# Patient Record
Sex: Male | Born: 1945 | Race: White | Hispanic: No | Smoking: Never smoker
Health system: Southern US, Community
[De-identification: ages and names within clinical notes are randomized; demographics above are authoritative.]

## PROBLEM LIST (undated history)

## (undated) DIAGNOSIS — S46011A Strain of muscle(s) and tendon(s) of the rotator cuff of right shoulder, initial encounter: Secondary | ICD-10-CM

## (undated) DIAGNOSIS — I482 Chronic atrial fibrillation, unspecified: Secondary | ICD-10-CM

## (undated) DIAGNOSIS — Z8619 Personal history of other infectious and parasitic diseases: Secondary | ICD-10-CM

## (undated) DIAGNOSIS — I878 Other specified disorders of veins: Secondary | ICD-10-CM

## (undated) DIAGNOSIS — R351 Nocturia: Secondary | ICD-10-CM

## (undated) DIAGNOSIS — M109 Gout, unspecified: Secondary | ICD-10-CM

## (undated) DIAGNOSIS — N138 Other obstructive and reflux uropathy: Secondary | ICD-10-CM

## (undated) DIAGNOSIS — I5032 Chronic diastolic (congestive) heart failure: Secondary | ICD-10-CM

## (undated) DIAGNOSIS — N486 Induration penis plastica: Secondary | ICD-10-CM

## (undated) DIAGNOSIS — S43014A Anterior dislocation of right humerus, initial encounter: Secondary | ICD-10-CM

## (undated) DIAGNOSIS — R739 Hyperglycemia, unspecified: Secondary | ICD-10-CM

## (undated) DIAGNOSIS — I10 Essential (primary) hypertension: Secondary | ICD-10-CM

## (undated) DIAGNOSIS — N401 Enlarged prostate with lower urinary tract symptoms: Secondary | ICD-10-CM

## (undated) DIAGNOSIS — N529 Male erectile dysfunction, unspecified: Secondary | ICD-10-CM

## (undated) DIAGNOSIS — E291 Testicular hypofunction: Secondary | ICD-10-CM

## (undated) DIAGNOSIS — R931 Abnormal findings on diagnostic imaging of heart and coronary circulation: Secondary | ICD-10-CM

## (undated) HISTORY — DX: Benign prostatic hyperplasia with lower urinary tract symptoms: N40.1

## (undated) HISTORY — DX: Benign prostatic hyperplasia with lower urinary tract symptoms: N13.8

## (undated) HISTORY — DX: Hyperglycemia, unspecified: R73.9

## (undated) HISTORY — PX: PILONIDAL CYST EXCISION: SHX744

## (undated) HISTORY — DX: Induration penis plastica: N48.6

## (undated) HISTORY — PX: HERNIA REPAIR: SHX51

## (undated) HISTORY — DX: Chronic diastolic (congestive) heart failure: I50.32

## (undated) HISTORY — DX: Other specified disorders of veins: I87.8

## (undated) HISTORY — PX: LUMBAR FUSION: SHX111

## (undated) HISTORY — DX: Male erectile dysfunction, unspecified: N52.9

## (undated) HISTORY — DX: Gout, unspecified: M10.9

## (undated) HISTORY — DX: Testicular hypofunction: E29.1

## (undated) HISTORY — DX: Personal history of other infectious and parasitic diseases: Z86.19

## (undated) HISTORY — DX: Abnormal findings on diagnostic imaging of heart and coronary circulation: R93.1

## (undated) HISTORY — DX: Chronic atrial fibrillation, unspecified: I48.20

## (undated) HISTORY — DX: Essential (primary) hypertension: I10

## (undated) HISTORY — PX: KNEE SURGERY: SHX244

## (undated) HISTORY — PX: OTHER SURGICAL HISTORY: SHX169

## (undated) HISTORY — DX: Nocturia: R35.1

---

## 1996-01-15 ENCOUNTER — Encounter: Payer: Self-pay | Admitting: Cardiovascular Disease

## 1999-12-12 ENCOUNTER — Encounter: Payer: Self-pay | Admitting: Neurosurgery

## 1999-12-14 ENCOUNTER — Inpatient Hospital Stay (HOSPITAL_COMMUNITY): Admission: RE | Admit: 1999-12-14 | Discharge: 1999-12-15 | Payer: Self-pay | Admitting: Neurosurgery

## 1999-12-14 ENCOUNTER — Encounter: Payer: Self-pay | Admitting: Neurosurgery

## 2000-01-15 ENCOUNTER — Ambulatory Visit (HOSPITAL_COMMUNITY): Admission: RE | Admit: 2000-01-15 | Discharge: 2000-01-15 | Payer: Self-pay | Admitting: Neurosurgery

## 2000-01-15 ENCOUNTER — Encounter: Payer: Self-pay | Admitting: Neurosurgery

## 2000-03-18 ENCOUNTER — Ambulatory Visit (HOSPITAL_COMMUNITY): Admission: RE | Admit: 2000-03-18 | Discharge: 2000-03-18 | Payer: Self-pay | Admitting: Neurosurgery

## 2000-03-18 ENCOUNTER — Encounter: Payer: Self-pay | Admitting: Neurosurgery

## 2003-07-09 HISTORY — PX: OTHER SURGICAL HISTORY: SHX169

## 2004-03-27 ENCOUNTER — Inpatient Hospital Stay (HOSPITAL_COMMUNITY): Admission: AD | Admit: 2004-03-27 | Discharge: 2004-03-31 | Payer: Self-pay | Admitting: *Deleted

## 2004-03-28 HISTORY — PX: CARDIAC CATHETERIZATION: SHX172

## 2004-03-29 HISTORY — PX: OTHER SURGICAL HISTORY: SHX169

## 2004-03-30 ENCOUNTER — Encounter (INDEPENDENT_AMBULATORY_CARE_PROVIDER_SITE_OTHER): Payer: Self-pay | Admitting: Specialist

## 2005-05-09 ENCOUNTER — Ambulatory Visit: Payer: Self-pay | Admitting: Family Medicine

## 2006-03-25 ENCOUNTER — Inpatient Hospital Stay (HOSPITAL_COMMUNITY): Admission: RE | Admit: 2006-03-25 | Discharge: 2006-03-28 | Payer: Self-pay | Admitting: Orthopedic Surgery

## 2006-03-25 HISTORY — PX: TOTAL HIP ARTHROPLASTY: SHX124

## 2008-08-08 HISTORY — PX: TRANSTHORACIC ECHOCARDIOGRAM: SHX275

## 2009-01-06 ENCOUNTER — Encounter: Payer: Self-pay | Admitting: Cardiovascular Disease

## 2009-01-06 LAB — CONVERTED CEMR LAB
Cholesterol: 190 mg/dL
HDL: 77 mg/dL
Triglyceride fasting, serum: 65 mg/dL

## 2009-11-18 ENCOUNTER — Ambulatory Visit: Payer: Self-pay | Admitting: Cardiovascular Disease

## 2009-11-18 DIAGNOSIS — I482 Chronic atrial fibrillation, unspecified: Secondary | ICD-10-CM

## 2009-11-18 DIAGNOSIS — I10 Essential (primary) hypertension: Secondary | ICD-10-CM

## 2009-11-24 ENCOUNTER — Encounter: Payer: Self-pay | Admitting: Cardiovascular Disease

## 2009-12-06 ENCOUNTER — Ambulatory Visit: Payer: Self-pay | Admitting: Internal Medicine

## 2009-12-06 ENCOUNTER — Ambulatory Visit: Payer: Self-pay | Admitting: Cardiovascular Disease

## 2009-12-07 ENCOUNTER — Ambulatory Visit: Payer: Self-pay | Admitting: Internal Medicine

## 2009-12-08 ENCOUNTER — Encounter: Payer: Self-pay | Admitting: Cardiovascular Disease

## 2009-12-08 LAB — CONVERTED CEMR LAB
INR: 1.63
INR: 1.63 — ABNORMAL HIGH (ref <1.50)
Prothrombin Time: 19.2 s
Prothrombin Time: 19.2 s — ABNORMAL HIGH (ref 11.6–15.2)

## 2009-12-13 ENCOUNTER — Ambulatory Visit: Payer: Self-pay | Admitting: Internal Medicine

## 2009-12-16 ENCOUNTER — Ambulatory Visit: Payer: Self-pay | Admitting: Internal Medicine

## 2009-12-19 ENCOUNTER — Encounter: Payer: Self-pay | Admitting: Cardiovascular Disease

## 2009-12-19 DIAGNOSIS — I739 Peripheral vascular disease, unspecified: Secondary | ICD-10-CM

## 2009-12-20 ENCOUNTER — Ambulatory Visit: Payer: Self-pay | Admitting: Internal Medicine

## 2009-12-20 ENCOUNTER — Ambulatory Visit: Payer: Self-pay

## 2009-12-20 ENCOUNTER — Ambulatory Visit: Payer: Self-pay | Admitting: Cardiovascular Disease

## 2009-12-21 ENCOUNTER — Encounter: Payer: Self-pay | Admitting: Cardiovascular Disease

## 2009-12-27 ENCOUNTER — Ambulatory Visit: Payer: Self-pay | Admitting: Internal Medicine

## 2010-01-03 ENCOUNTER — Ambulatory Visit: Payer: Self-pay | Admitting: Internal Medicine

## 2010-01-03 ENCOUNTER — Ambulatory Visit: Payer: Self-pay | Admitting: Cardiovascular Disease

## 2010-01-04 LAB — CONVERTED CEMR LAB
INR: 2.13
Prothrombin Time: 23.8 s

## 2010-01-10 ENCOUNTER — Ambulatory Visit: Payer: Self-pay | Admitting: Internal Medicine

## 2010-01-16 ENCOUNTER — Ambulatory Visit: Payer: Self-pay | Admitting: Internal Medicine

## 2010-02-01 ENCOUNTER — Encounter: Payer: Self-pay | Admitting: Cardiovascular Disease

## 2010-02-01 ENCOUNTER — Ambulatory Visit: Payer: Self-pay | Admitting: Cardiovascular Disease

## 2010-02-02 LAB — CONVERTED CEMR LAB
INR: 2.85 — ABNORMAL HIGH (ref <1.50)
Prothrombin Time: 29.7 s
Prothrombin Time: 29.7 s — ABNORMAL HIGH (ref 11.6–15.2)

## 2010-03-27 ENCOUNTER — Encounter: Payer: Self-pay | Admitting: Cardiovascular Disease

## 2010-04-03 ENCOUNTER — Telehealth: Payer: Self-pay | Admitting: Cardiovascular Disease

## 2010-04-06 ENCOUNTER — Ambulatory Visit: Payer: Self-pay | Admitting: Cardiovascular Disease

## 2010-04-06 LAB — CONVERTED CEMR LAB: POC INR: 3.2

## 2010-04-19 ENCOUNTER — Ambulatory Visit: Payer: Self-pay | Admitting: Cardiovascular Disease

## 2010-05-05 ENCOUNTER — Ambulatory Visit: Payer: Self-pay | Admitting: Cardiovascular Disease

## 2010-05-17 ENCOUNTER — Ambulatory Visit: Payer: Self-pay | Admitting: Cardiovascular Disease

## 2010-06-14 ENCOUNTER — Emergency Department (HOSPITAL_COMMUNITY): Admission: EM | Admit: 2010-06-14 | Discharge: 2010-06-15 | Payer: Self-pay | Admitting: Emergency Medicine

## 2010-06-21 ENCOUNTER — Encounter: Payer: Self-pay | Admitting: Cardiovascular Disease

## 2010-11-05 LAB — CONVERTED CEMR LAB
INR: 1.82 — ABNORMAL HIGH (ref <1.50)
Prothrombin Time: 20.9 s
Prothrombin Time: 20.9 s — ABNORMAL HIGH (ref 11.6–15.2)

## 2010-11-07 NOTE — Progress Notes (Signed)
Summary: Overdue for coumadin follow-up  Phone Note Outgoing Call   Call placed by: Cloyde Reams RN,  April 03, 2010 2:32 PM Call placed to: Patient Summary of Call: Called spoke with pt.  Coumadin last checked 02/02/10, pt is past due for coumadin f/u pt has coumadin f/u scheduled for 04/26/10.  Advised pt needs sooner appt has been 2 months since last check.  Scheduled pt appt for 04/06/10 at 10am. Initial call taken by: Cloyde Reams RN,  April 03, 2010 2:37 PM

## 2010-11-07 NOTE — Medication Information (Signed)
Summary: [facility]  Anticoagulant Therapy  Managed by: Cloyde Reams, RN, BSN Referring MD: Mariah Milling PCP: [name], MD Supervising MD: Mariah Milling Indication 1: Atrial Fibrillation Lab Used: [facility] Site: [place] INR POC 3.2 INR RANGE 2.0-3.0  Dietary changes: no    Health status changes: no    Bleeding/hemorrhagic complications: no    Recent/future hospitalizations: no    Any changes in medication regimen? no    Recent/future dental: no  Any missed doses?: no       Is patient compliant with meds? yes       Allergies: No Known Drug Allergies  Anticoagulation Management History:      The patient is taking warfarin and comes in today for a routine follow up visit.  Negative risk factors for bleeding include an age less than 74 years old.  The bleeding index is 'low risk'.  Positive CHADS2 values include History of HTN.  Negative CHADS2 values include Age > 59 years old.  His last INR was 2.85.  Anticoagulation responsible provider: Blaike Vickers.  INR POC: 3.2.  Cuvette Lot#: [id number].  Exp: 06/2011.    Anticoagulation Management Assessment/Plan:      The patient's current anticoagulation dose is Warfarin sodium 6 mg tabs: 1 by mouth once daily.  The target INR is 2.0-3.0.  The next INR is due 05/08/2010.  Anticoagulation instructions were given to patient.  Results were reviewed/authorized by Cloyde Reams, RN, BSN.  He was notified by Cloyde Reams RN.         Prior Anticoagulation Instructions: INR 3.2  Pt has not been eating greens at all.  Asked him to start eating greens.  Continue same dose of 1 tab daily except 1.5 on Wednesday.  Recheck in 2 weeks.   Current Anticoagulation Instructions: INR 3.2  Start taking 6mg  daily. Recheck in 3 weeks.

## 2010-11-07 NOTE — Progress Notes (Signed)
Summary: PHI  PHI   Imported By: Harlon Flor 11/22/2009 15:53:40  _____________________________________________________________________  External Attachment:    Type:   Image     Comment:   External Document

## 2010-11-07 NOTE — Assessment & Plan Note (Signed)
Summary: NEW PT;   Visit Type:  New Patient Primary Provider:  [name], MD  CC:  high blood pressure the other day at hospital 145/96. No cp, sob all the time, and edema in ankles and feet. [name] Kitchen  History of Present Illness: [name] is a very pleasant 65 year old male with a history of chronic atrial fibrillation, moderate to severely dilated left atrium on echo in November 2009, history of hypertension who presents to reestablish care.  [name] states that his blood pressure has been elevated. He has noted diastolic pressures in the mid 82N. He has been taking his clonidine on an occasional basis. Otherwise he feels well and exercises at the gym on a frequent basis. He denies any shortness of breath. He does have some lower extremity edema which he takes Lasix as needed.  Preventive Screening-Counseling & Management  Alcohol-Tobacco     Alcohol drinks/day: 1     Smoking Status: never  Caffeine-Diet-Exercise     Caffeine use/day: none     Does Patient Exercise: yes  Current Problems (verified): 1)  Atrial Fibrillation  (ICD-427.31) 2)  Hypertension, Benign  (ICD-401.1)  Current Medications (verified): 1)  Warfarin Sodium 6 Mg Tabs (Warfarin Sodium) .[name] Kitchen.. 1 By Mouth Once Daily 2)  Carvedilol 25 Mg Tabs (Carvedilol) .... Take One Tablet By Mouth Twice A Day 3)  Allopurinol 300 Mg Tabs (Allopurinol) .... As Needed With Gout 4)  Colchicine 0.6 Mg Tabs (Colchicine) .... As Needed With Gout 5)  Daily Vitamins  Tabs (Multiple Vitamin) .[name] Kitchen.. 1 By Mouth Once Daily 6)  Benazepril-Hydrochlorothiazide 20-12.5 Mg Tabs (Benazepril-Hydrochlorothiazide) .[name] Kitchen.. 1 By Mouth Once Daily 7)  Clonidine Hcl 0.1 Mg Tabs (Clonidine Hcl) .... 1/2 By Mouth Once Daily 8)  Flomax 0.4 Mg Caps (Tamsulosin Hcl) .[name] Kitchen.. 1 By Mouth At Bedtime 9)  Proscar 5 Mg Tabs (Finasteride) .[name] Kitchen.. 1 By Mouth Once Daily 10)  Tetracycline Hcl 500 Mg Caps (Tetracycline Hcl) .[name] Kitchen.. 1 By Mouth Two Times A Day -  Not Sure of  Mg  Allergies (verified): No Known Drug Allergies  Past History:  Past Medical History: Last updated: 11/18/2009 Chronic Atrial Fib Echo 2009-moderate to severely dilated left atrium  hypertension Chronic venous stasis  Past Surgical History: Last updated: 11/18/2009  History of cardioversion in 2005. Pilonidal cyst  excision, lumbar fusion, cardiac catheterization, hernia repair age 38, right  eyebrow tumor removal.  Family History: Last updated: 11/18/2009  History of hypertension with father.  Social History: Last updated: 11/18/2009  Divorced. Teacher. Nonsmoker. Occasional intake of alcohol.  One child. His sister will be assisting with care after surgery.  Risk Factors: Alcohol Use: 1 (11/18/2009) Caffeine Use: none (11/18/2009) Exercise: yes (11/18/2009)  Risk Factors: Smoking Status: never (11/18/2009)  Social History: Alcohol drinks/day:  1 Smoking Status:  never Caffeine use/day:  none Does Patient Exercise:  yes  Review of Systems       The patient complains of peripheral edema.  The patient denies anorexia, fever, weight loss, weight gain, vision loss, decreased hearing, hoarseness, chest pain, syncope, dyspnea on exertion, prolonged cough, headaches, hemoptysis, abdominal pain, melena, hematochezia, severe indigestion/heartburn, hematuria, incontinence, genital sores, muscle weakness, suspicious skin lesions, transient blindness, difficulty walking, depression, unusual weight change, abnormal bleeding, enlarged lymph nodes, angioedema, breast masses, and testicular masses.    Vital Signs:  Patient profile:   65 year old male Height:      75 inches Weight:      220.75 pounds BMI:     27.69  Pulse rate:   65 / minute Pulse rhythm:   irregular BP sitting:   130 / 96  (left arm) Cuff size:   large  Vitals Entered By: Mercer Pod (November 18, 2009 9:25 AM)  Physical Exam  General:  well-appearing male in no apparent distress. Alert, oriented  x3, H. EENT exam is benign, oropharynx is clear, neck is supple with no JVP or carotid bruits, heart sounds are regular with no murmurs rubs or gallops, lungs are clear with no wheezes Rales, abdominal exam is benign, he has no significant 1+ LE edema, neurologic exam is grossly nonfocal, skin is warm and dry. Pulses are equal and symmetrical in his upper and lower extremities.   Impression & Recommendations:  Problem # 1:  HYPERTENSION, BENIGN (ICD-401.1) [name] states that his blood pressure is elevated when he checks it. His diastolic has been running in the mid 90s. He would like to decrease his medication list though once improved blood pressure control. We will double his benazepril/lHCTZ to 40/25 mg daily. I have asked him to purchase a blood pressure cuff but he prefers to have it checked at the local fire department. He did hold his clonidine and monitor his pressures. At asked him to contact us if his pressure continues to be elevated. He should also use his Lasix more frequently as he has lower extremity edema. her blood pressure continues to be a problem, we could increase his Coreg to one and a half tabs b.i.d. His updated medication list for this problem includes:    Carvedilol 12.5 Mg Tabs (Carvedilol) .[name] Kitchen... Take one tablet by mouth twice a day    Benazepril-hydrochlorothiazide 20-12.5 Mg Tabs (Benazepril-hydrochlorothiazide) .[name] Kitchen... 1 by mouth once two times a day    Clonidine Hcl 0.1 Mg Tabs (Clonidine hcl) .[name] Kitchen... 1/2 by mouth once daily  Problem # 2:  ATRIAL FIBRILLATION (ICD-427.31) we'll continue him on his current dose of Coreg which is 12.5 mg b.i.d. He has no symptoms of shortness of breath and exercises frequently with no problems. His updated medication list for this problem includes:    Warfarin Sodium 6 Mg Tabs (Warfarin sodium) .[name] Kitchen... 1 by mouth once daily    Carvedilol 12.5 Mg Tabs (Carvedilol) .[name] Kitchen... Take one tablet by mouth twice a  day Prescriptions: BENAZEPRIL-HYDROCHLOROTHIAZIDE 20-12.5 MG TABS (BENAZEPRIL-HYDROCHLOROTHIAZIDE) 1 by mouth once two times a day  #60 x 11   Entered by:   Charlena Cross, RN, BSN   Authorized by:   Dossie Arbour MD   Signed by:   Charlena Cross, RN, BSN on 11/18/2009   Method used:   Electronically to        [facility]. [address] [phone]* (retail)       [address] [place], [place]  [id number]       Ph: [phone]       Fax: [phone]   RxID:   [id number]   Appended Document: NEW PT; labs from April 2010 shows normal liver function, cholesterol 190, HDL 77, LDL 100.

## 2010-11-07 NOTE — Miscellaneous (Signed)
Summary: Orders Update  Clinical Lists Changes  Problems: Added new problem of PVD (ICD-443.9) Orders: Added new Test order of Arterial Duplex Lower Extremity (Arterial Duplex Low) - Signed 

## 2010-11-07 NOTE — Letter (Signed)
Summary: Medical Record Release  Medical Record Release   Imported By: Harlon Flor 11/24/2009 15:29:31  _____________________________________________________________________  External Attachment:    Type:   Image     Comment:   External Document

## 2010-11-07 NOTE — Medication Information (Signed)
Summary: [facility]/ALT  Anticoagulant Therapy  Managed by: Cloyde Reams, RN, BSN Referring MD: Mariah Milling PCP: [name], MD Supervising MD: Mariah Milling Indication 1: Atrial Fibrillation Lab Used: [facility] Site: [place] INR POC 1.9 INR RANGE 2.0-3.0  Dietary changes: no     Bleeding/hemorrhagic complications: no    Recent/future hospitalizations: no    Any changes in medication regimen? no     Any missed doses?: no       Is patient compliant with meds? yes       Allergies: No Known Drug Allergies  Anticoagulation Management History:      The patient is taking warfarin and comes in today for a routine follow up visit.  Negative risk factors for bleeding include an age less than 13 years old.  The bleeding index is 'low risk'.  Positive CHADS2 values include History of HTN.  Negative CHADS2 values include Age > 34 years old.  His last INR was 2.85.  Anticoagulation responsible provider: Jaicey Sweaney.  INR POC: 1.9.  Cuvette Lot#: [id number].  Exp: 05/2011.    Anticoagulation Management Assessment/Plan:      The patient's current anticoagulation dose is Warfarin sodium 6 mg tabs: 1 by mouth once daily.  The target INR is 2.0-3.0.  The next INR is due 05/24/2010.  Anticoagulation instructions were given to patient.  Results were reviewed/authorized by Cloyde Reams, RN, BSN.  He was notified by Benedict Needy, RN.         Prior Anticoagulation Instructions: INR 3.2  Start taking 6mg  daily. Recheck in 3 weeks.    Current Anticoagulation Instructions: INR 1.9  Take 1.5 tabs today. Continue taking 1 tab daily. Recheck in 2 weeks.

## 2010-11-07 NOTE — Medication Information (Signed)
Summary: [facility]  Anticoagulant Therapy  Managed by: Inactive Referring MD: [name] PCP: [name] Merry, MD Supervising MD: [name] Indication 1: Atrial Fibrillation Lab Used: [facility] Site: [place] INR RANGE 2.0-3.0          Comments: Called spoke with pt, pt states he is no longer seeing Dr [name], he has transfered his care to [facility] heart [facility], and is getting his PT/INR's checked there.  Had INR done last week there. [name] Reams RN  June 21, 2010 12:27 PM   Allergies: No Known Drug Allergies  Anticoagulation Management History:      Negative risk factors for bleeding include an age less than 4 years old.  The bleeding index is 'low risk'.  Positive CHADS2 values include History of HTN.  Negative CHADS2 values include Age > 67 years old.  His last INR was 2.85.  Anticoagulation responsible provider: gollan.  Exp: 07/2011.    Anticoagulation Management Assessment/Plan:      The patient's current anticoagulation dose is Warfarin sodium 6 mg tabs: 1 by mouth once daily.  The target INR is 2.0-3.0.  The next INR is due 06/14/2010.  Anticoagulation instructions were given to patient.  Results were reviewed/authorized by Inactive.         Prior Anticoagulation Instructions: INR 2.4  Continue on same dosage 6mg  daily.  Recheck in 4 weeks.

## 2010-11-07 NOTE — Medication Information (Signed)
Summary: [facility]/NE  Anticoagulant Therapy  Managed by: Cloyde Reams, RN, BSN Referring MD: Mariah Milling PCP: [name], MD Supervising MD: Mariah Milling Indication 1: Atrial Fibrillation Lab Used: [facility] Site:  INR POC 2.4 INR RANGE 2.0-3.0  Dietary changes: no    Health status changes: no    Bleeding/hemorrhagic complications: no    Recent/future hospitalizations: no    Any changes in medication regimen? no    Recent/future dental: no  Any missed doses?: no       Is patient compliant with meds? yes       Allergies: No Known Drug Allergies  Anticoagulation Management History:      The patient is taking warfarin and comes in today for a routine follow up visit.  Negative risk factors for bleeding include an age less than 32 years old.  The bleeding index is 'low risk'.  Positive CHADS2 values include History of HTN.  Negative CHADS2 values include Age > 74 years old.  His last INR was 2.85.  Anticoagulation responsible provider: gollan.  INR POC: 2.4.  Cuvette Lot#: [id number].  Exp: 07/2011.    Anticoagulation Management Assessment/Plan:      The patient's current anticoagulation dose is Warfarin sodium 6 mg tabs: 1 by mouth once daily.  The target INR is 2.0-3.0.  The next INR is due 06/14/2010.  Anticoagulation instructions were given to patient.  Results were reviewed/authorized by Cloyde Reams, RN, BSN.  He was notified by Cloyde Reams RN.         Prior Anticoagulation Instructions: INR 1.9  Take 1.5 tabs today. Continue taking 1 tab daily. Recheck in 2 weeks.   Current Anticoagulation Instructions: INR 2.4  Continue on same dosage 6mg  daily.  Recheck in 4 weeks.

## 2010-11-07 NOTE — Medication Information (Signed)
Summary: [facility]/ewj  Anticoagulant Therapy  Managed by: Cloyde Reams, RN, BSN Referring MD: Mariah Milling PCP: [name], MD Supervising MD: Shirlee Latch MD, Dalton Indication 1: Atrial Fibrillation Lab Used: [facility] Site: [place] INR POC 3.2 INR RANGE 2.0-3.0    Bleeding/hemorrhagic complications: no     Any changes in medication regimen? no     Any missed doses?: no       Is patient compliant with meds? yes       Allergies: No Known Drug Allergies  Anticoagulation Management History:      The patient is taking warfarin and comes in today for a routine follow up visit.  Negative risk factors for bleeding include an age less than 23 years old.  The bleeding index is 'low risk'.  Positive CHADS2 values include History of HTN.  Negative CHADS2 values include Age > 51 years old.  His last INR was 2.85.  Anticoagulation responsible provider: Shirlee Latch MD, Dalton.  INR POC: 3.2.  Cuvette Lot#: [id number].  Exp: 06/2011.    Anticoagulation Management Assessment/Plan:      The patient's current anticoagulation dose is Warfarin sodium 6 mg tabs: 1 by mouth once daily.  The target INR is 2.0-3.0.  The next INR is due 04/19/2010.  Anticoagulation instructions were given to patient.  Results were reviewed/authorized by Cloyde Reams, RN, BSN.  He was notified by Benedict Needy, RN.         Prior Anticoagulation Instructions: INR 2.85  Attempted to call pt with results.  LMOM TCB. Cloyde Reams RN  February 02, 2010 10:29 AM  Called spoke with pt, advised to continue on same dosage 1 tablet daily except 1.5 tablets on Wednesdays.  Recheck in 4 weeks.    Current Anticoagulation Instructions: INR 3.2  Pt has not been eating greens at all.  Asked him to start eating greens.  Continue same dose of 1 tab daily except 1.5 on Wednesday.  Recheck in 2 weeks.

## 2011-02-23 NOTE — Op Note (Signed)
NAME:  [name], [name]                       ACCOUNT NO.:  [id number]   MEDICAL RECORD NO.:  [id number]                   PATIENT TYPE:  INP   LOCATION:  2034                                 FACILITY:  [facility]   PHYSICIAN:  Kinnie Scales. Annalee [name], M.D.            DATE OF BIRTH:  02/12/1946   DATE OF PROCEDURE:  03/30/2004  DATE OF DISCHARGE:                                 OPERATIVE REPORT   PREOPERATIVE DIAGNOSIS:  1. Recurrent right forehead mass.  2. 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  3. Chronic atrial fibrillation.  4. Status post cardiac catheterization and cardioversion (March 29, 2004, Dr.     Lenise Herald).   POSTOPERATIVE DIAGNOSIS:  1. Recurrent right forehead mass.  2. 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  3. Chronic atrial fibrillation.  4. Status post cardiac catheterization and cardioversion (March 29, 2004, Dr.     Lenise Herald).   PROCEDURE:  Revision excisional biopsy right forehead mass.   ANESTHESIA:  General endotracheal.   SURGEON:  Kinnie Scales. Annalee [name], M.D.   COMPLICATIONS:  None.   ESTIMATED BLOOD LOSS:  Minimal.   DISPOSITION:  The patient is transferred from the operating room to the  recovery room in stable condition.   BRIEF HISTORY:  [name] is a 64 year old white male who presented for  evaluation of a right anterior forehead mass.  The patient had undergone  previous excision in [place] approximately one year ago for a benign  lipoma of the forehead.  The patient had excellent postoperative healing but  noted continued swelling in the surgical site over the ensuing year.  He was  scheduled for admission to the hospital under Dr. Martyn Malay care on  the cardiology service for cardiac catheterization and cardioversion for  chronic atrial fibrillation.  The patient's anticoagulant therapy was  modified to heparin and with a clear cardiac cath and successful  cardioversion, the patient was  scheduled for the above surgical procedure  under general anesthesia at [facility] main OR on March 30, 2004.  The risks, benefits, and possible complications of the procedure including  recurrent lipoma or mass, poor wound healing and scarring, bleeding,  paralysis of the forehead musculature, and chronic numbness, were discussed  in detail with the patient.  These risks and possible complications were  discussed but are not exclusive of other possible risks and problems.  The  patient understood and concurred with our plan for surgery which was  scheduled as outlined above.   SURGICAL PROCEDURE:  The patient was brought to the operating room on March 30, 2004.  General endotracheal anesthesia was established without  difficulty.  When the patient had been adequately anesthetized, his head was  cleaned and he was injected with 1 mL of 1% lidocaine with 1:100,000  epinephrine in a pre-existing skin crease/previous surgical scar along the  right anterior forehead.  After allowing adequate time for  vasoconstriction,  the patient was prepped and draped in a sterile fashion.  The surgical  procedure was begun by creating a 2 cm horizontally oriented incision with a  #15 scalpel blade.  The incision was created in the pre-existing scar from  his prior surgery which corresponded with a natural anterior forehead  crease.  The dissection was carried out meticulously through the  subcutaneous tissue.  A significant amount of scar tissue was encountered  immediately deep to the prior surgical scar and this was resected in its  entirety.  The supraorbital and supratrochlear neurovascular bundle was not  encountered at this plane and dissection was carried out deep through the  anterior forehead muscles, which had been previously divided.  Deep to the  muscles and adjacent to the periosteum of the skull, an approximately 2 by 2  cm lipoma was encountered.  This was gently dissected from the  surrounding  deep tissues and removed in its entirety.  The individual specimens were  sent to pathology for gross and microscopic evaluation.  The patient's wound  was then thoroughly irrigated with saline solution.  There was no active  bleeding.  Several areas of minimal bleeding were cauterized with bipolar  cautery.  The wound was then closed in multiple layers consisting of  reapproximation of the forehead musculature with a 4-0 Vicryl suture in an  interrupted fashion, deep subcutaneous tissue was reapproximated with a 5-0  Vicryl suture in an interrupted fashion, and final skin closure was achieved  with a 6-0 Ethilon suture in an interrupted fashion along the skin margins.  The wound was cleaned and dressed with Bacitracin ointment.  He was awakened  from his anesthetic, he was extubated and transferred from the operating  room to the recovery room in stable condition.  There were no complications.  Estimated blood loss minimal.                                               [name], M.D.    DLS/MEDQ  D:  04/54/0981  T:  03/31/2004  Job:  [id number]   cc:   Darlin Priestly, M.D.  [address], [address]  [place]  [place] [address]  Fax: [phone]

## 2011-02-23 NOTE — Discharge Summary (Signed)
NAME:  [name], [name]               ACCOUNT NO.:  [id number]   MEDICAL RECORD NO.:  [id number]          PATIENT TYPE:  INP   LOCATION:  1612                         FACILITY:  [facility]   PHYSICIAN:  Ollen Gross, M.D.    DATE OF BIRTH:  [birth year]   DATE OF ADMISSION:  03/25/2006  DATE OF DISCHARGE:  03/28/2006                                 DISCHARGE SUMMARY   ADMISSION DIAGNOSES:  1. Osteoarthritis, right hip.  2. Hypertension.  3. History of atrial fibrillation.  4. History of elevated uric acid.   DISCHARGE DIAGNOSES:  1. Osteoarthritis, right hip, status post right total hip arthroplasty.  2. Acute blood loss anemia.  3  Hypertension.  1. History of atrial fibrillation.  2. History of elevated uric acid.   PROCEDURE PERFORMED:  March 25, 2006 right total hip arthroplasty.   SURGEON:  Ollen Gross, M.D.   ASSISTANT:  Alexzandrew L. Perkins, P.A.   CONSULTATIONS:  None.   HISTORY OF PRESENT ILLNESS:  [name] is a 65 year old with end stage  arthritis of the right hip failed nonoperative management including  injections, now presents for total hip arthroplasty.   LABORATORY DATA:  Preoperative CBC:  Hemoglobin 13.6, hematocrit 39.8, white  blood cell count 4300.  Postop hemoglobin 9.5 drifting down to 8.3, last  hemoglobin and hematocrit was 8.0 and 23.2.  PT and PTT preop 14.3 and 30  respectively.  INR of 1.1__________ PT and INR 16.2 and 1.3.  Chem panel on  admission had mildly elevated AST at 38.  Remainder of chem panel within  normal limits.  Serial BMETs were followed.  Serial electrolytes remained  within normal limits.  Urinalysis negative.  Blood group type A positive.   EKG March 29, 2006 atrial fibrillation with premature ventricular variant  conducted complexes.  Left axis deviation, nonspecific interventricular  conduction delay, ST and T wave abnormalities.  No change since last tracing  from Dr. Kristeen Miss.  2-view chest March 21, 2006 slight  prominence of  pericardial silhouette with no evidence of active cardiopulmonary disease.  Portable pelvis March 25, 2006 right total hip in good position on portable  view.  Portal hip March 25, 2006 right total hip in good positional single  portable view.   HOSPITAL COURSE:  Admitted [facility], tolerated  procedure well, later sent to recovery room and orthopedic floor started on  PCA and p.o. analgesic pain control after surgery.  Given 24 hours  postoperative IV antibiotics.  Was doing very well on the morning of day 1  in good spirits, Hemovac drain placed at surgery was pulled on day 1,  started getting up with physical therapy.  By day 2 was doing great, little  bit of pain, but all of his preoperative severe pain was gone.  He did well  with his therapy ambulating 175 feet.  Dressing was changed and incision  looked good.  His hemoglobin was low at 8.3 but he denied any  lightheadedness, dizziness or fatigue.  He was doing well, weaned over to  p.o. meds by following day of March 28, 2006, despite having his hemoglobin a  little low at 8.0 he was doing well with no complaints.  Did well with his  therapy and was discharged home.   DISCHARGE PLAN:  1. The patient was discharged home on March 28, 2006.  2. Discharge diagnosis:  See above.  3. Discharge meds:  Coumadin, Lovenox for 3 days. Nu-Iron, Percocet and      Robaxin.   Diet as tolerated.  Follow-up two weeks.  Activity:  Home PT, home nursing,  total hip protocol 25 to 50% partial weightbearing right lower extremity.   DISPOSITION:  Home.   CONDITION ON DISCHARGE:  Stable and improved.      Alexzandrew L. Julien Girt, P.A.      Ollen Gross, M.D.  Electronically Signed    ALP/MEDQ  D:  05/09/2006  T:  05/09/2006  Job:  [id number]   cc:   Darlin Priestly, MD  Fax: [phone]   [name]  Fax: [phone]

## 2011-02-23 NOTE — Cardiovascular Report (Signed)
NAME:  [name], [name]                       ACCOUNT NO.:  [id number]   MEDICAL RECORD NO.:  [id number]                   PATIENT TYPE:  INP   LOCATION:  2034                                 FACILITY:  [facility]   PHYSICIAN:  Darlin Priestly, M.D.             DATE OF BIRTH:  Feb 22, 1946   DATE OF PROCEDURE:  03/29/2004  DATE OF DISCHARGE:                              CARDIAC CATHETERIZATION   PROCEDURES:  TEE guided DC cardioversion.   ATTENDING PHYSICIAN:  Darlin Priestly, M.D.   COMPLICATIONS:  None.   INDICATIONS:  Mr. Kuch is a 65 year old male patient of Dr. [name]  with a history of a atrial fibrillation dating back to 2000, history of  moderately depressed EF at 30% who is now admitted for attempt at  restoration of sinus rhythm and cardiac catheterization.  The patient did  undergo cardiac catheterization on March 28, 2004 revealing nonspecific  coronary artery disease with moderately severe depressed EF.  He is now  brought for TEE guided cardioversion.   DESCRIPTION OF OPERATION:  After obtaining informed written consent, the  patient was brought to the endoscopy suite in the fasting state.  The  patient underwent successful noncomplicated transesophageal echocardiogram.   There was moderate LV dilatation with moderate to severely depressed LV  systolic function.  Estimated EF is approximately 30%.   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   Obstruction of  the mitral valve with mild mitral regurgitation.   Obstruction of the tricuspid valve with mild tricuspid regurgitation.   No evidence of intracardiac mass or thrombus noted.   There was a moderate amount of spontaneous echo contrast noted in the left  atrium and left atrial appendage as well as LV consistent with sluggish  flow.   Normal descending thoracic aorta.   Upon conclusion of the TEE, the patient then was given 125 mg of Pentothal  by Dr. Sondra Come.  The patient then received one biphasic  shock at 150 joules  with restoration to sinus bradycardia.  He awoke and was transferred to the  recovery room in satisfactory condition.   CONCLUSION:  1. Successful transesophageal echocardiogram with findings noted above.  2. Successful DC cardioversion to sinus rhythm.                                               Darlin Priestly, M.D.    RHM/MEDQ  D:  03/29/2004  T:  03/30/2004  Job:  [id number]   cc:   [name]  [address] [place]  [place] [address]  Fax: [phone]

## 2011-02-23 NOTE — Discharge Summary (Signed)
NAME:  [name], [name]               ACCOUNT NO.:  [id number]   MEDICAL RECORD NO.:  [id number]          PATIENT TYPE:  INP   LOCATION:  1612                         [facility]ACILITY:  [facility]   PHYSICIAN:  Alexzandrew L. Perkins, P.A.DATE OF BIRTH:  Oct 27, 1945   DATE OF ADMISSION:  03/25/2006  DATE OF DISCHARGE:  03/28/2006                                 DISCHARGE SUMMARY   Audio too short to transcribe (less than 5 seconds)      Alexzandrew L. Julien Girt, P.A.     ALP/MEDQ  D:  05/09/2006  T:  05/09/2006  Job:  [id number]

## 2011-02-23 NOTE — Cardiovascular Report (Signed)
NAME:  [name], KNUEPPEL                       ACCOUNT NO.:  [id number]   MEDICAL RECORD NO.:  [id number]                   PATIENT TYPE:  INP   LOCATION:  2034                                 FACILITY:  [facility]   PHYSICIAN:  Darlin Priestly, M.D.             DATE OF BIRTH:  [birth year]   DATE OF PROCEDURE:  03/28/2004  DATE OF DISCHARGE:                              CARDIAC CATHETERIZATION   PROCEDURES:  1. Left heart catheterization.  2. Coronary angiography.  3. Left ventriculogram.  4. Abdominal aortogram.   ATTENDING PHYSICIAN:  Darlin Priestly, M.D.   COMPLICATIONS:  None.   INDICATIONS:  [name] is a 65 year old male patient of Dr. [name]  with history of atrial fibrillation dated back to 2000, history of  moderately depressed EF by echo at approximately 30%, history of abnormal  Cardiolite scan suggesting a possible anterior wall scar who is now brought  for Coumadin to heparin crossover with subsequent cardiac catheterization,  TEE guided cardioversion and lipoma excision from the right forehead.   DESCRIPTION OF OPERATION:  After obtaining informed written consent, the  patient was brought to the cardiac catheterization laboratory.  Right and  left groin were  shaved and prepped and draped in the usual sterile fashion.  ECG monitor was established.  Using the modified Seldinger technique, a 6  French arterial sheath was inserted in the right femoral artery. A 6 French  diagnostic catheter was then used to performed diagnostic angiography.  This  reveals a large left main with no significant disease.  The LAD is a large  vessel that coursed to the apex and gives rise to two diagonal branches.  The LAD has no significant disease.  The first and second diagonal are  medium size vessels with no significant disease.   The left coronary artery also gives rise to a medium size ramus intermedius  and bifurcates in the mid segment and has no significant  disease.   Left circumflex is a large vessel which courses in AV groove and goes to one  large bifurcating obtuse marginal.  The AV groove circumflex has no  significant disease.  The first OM is a large vessel that bifurcates in the  mid segment and has no significant disease.   The right coronary artery is a large vessel which is dominant.  Gives rise  to both PDA as well as posterior lateral branch.  There is no significant  disease in the RCA, PDA or posterior lateral branch.   LEFT VENTRICULOGRAM:  Left ventriculogram reveals a severely depressed EF at  25-30%.  There is global hypokinesis.   ABDOMINAL AORTOGRAM:  Reveals no evidence of renal artery stenosis.   HEMODYNAMICS:  Systemic arterial pressure 140/84, LV pressure 130/10, LVEDP  of 14.   CONCLUSIONS:  1. No significant coronary artery disease.  2. Moderate to severely depressed left ventricular systolic function.  3. No evidence of renal artery stenosis.  4. Systemic hypertension.                                               Darlin Priestly, M.D.    RHM/MEDQ  D:  03/28/2004  T:  03/29/2004  Job:  [id number]   cc:   [name]  [address]  [place]  [place] [address]  Fax: [phone]

## 2011-02-23 NOTE — H&P (Signed)
NAME:  [name], [name]               ACCOUNT NO.:  [id number]   MEDICAL RECORD NO.:  [id number]          PATIENT TYPE:  INP   LOCATION:  1612                         FACILITY:  [facility]   PHYSICIAN:  Ollen Gross, M.D.    DATE OF BIRTH:  [birth year]   DATE OF ADMISSION:  03/25/2006  DATE OF DISCHARGE:  03/28/2006                                HISTORY & PHYSICAL   CHIEF COMPLAINT:  Right hip pain.   HISTORY OF PRESENT ILLNESS:  The patient is a 65 year old male with  continued right hip pain. He has been seen by Dr. Lequita Halt. Found to have end  stage arthritis of the right hip. It has progressively gotten worse with  time. He subsequently reached a point where he had to undergo hip surgery.  Risks and benefits have been discussed. The patient subsequently admitted to  the hospital. He has been seen preoperatively by Dr. Lenise Herald and felt  low cardiovascular risk for upcoming surgery.   ALLERGIES:  NO KNOWN DRUG ALLERGIES.   CURRENT MEDICATIONS:  Coumadin, amiodarone, Coreg, Allopurinol, colchicine,  multivitamin, Lotensin, Clonidine, and Tramadol.   PAST MEDICAL HISTORY:  Hypertension, atrial fibrillation, history of  elevated uric acid.   PAST SURGICAL HISTORY:  History of cardioversion in 2005. Pilonidal cyst  excision, lumbar fusion, cardiac catheterization, hernia repair age 63, right  eyebrow tumor removal.   SOCIAL HISTORY:  Divorced. Teacher. Nonsmoker. Occasional intake of alcohol.  One child. His sister will be assisting with care after surgery.   FAMILY HISTORY:  History of hypertension with father.   REVIEW OF SYSTEMS:  GENERAL:  No fever, chills, night sweats. NEUROLOGIC:  No seizures, syncope, or paralysis. RESPIRATORY:  No shortness of breath,  productive cough, or hemoptysis. CARDIOVASCULAR:  No chest pain, angina,  orthopnea. GASTROINTESTINAL:  No nausea, vomiting, diarrhea, or  constipation. GENITOURINARY:  No dysuria, hematuria or discharge.  MUSCULOSKELETAL:  Right hip.   PHYSICAL EXAMINATION:  VITAL SIGNS:  Pulse 52, respiratory rate 12, blood  pressure 130/82.  GENERAL:  A 65 year old white male, well developed, well nourished, no acute  distress. His is alert, oriented, cooperative, and pleasant. He is a good  historian.  HEENT:  Normocephalic and atraumatic. Pupils are equal, round, and reactive.  Oropharynx clear. EOM's intact.  NECK:  Trace plus to plus 1 carotid bruit. Otherwise, neck is supple.  CHEST:  Clear anterior and posterior chest wall.  HEART:  Regular rate and rhythm. No murmurs. Slightly bradycardiac at 52. S1  and S2 noted.  ABDOMEN:  Soft, nontender. Bowel sounds present.  RECTAL/BREAST/GENITALIA:  Not done, not pertinent to present illness.  EXTREMITIES:  Right hip. Hip shows flexion of 95 degrees with zero internal  rotation, about 10 to 15 degrees with external rotation, about 20 degrees  with abduction, slight antalgic gait   IMPRESSION:  1. Osteoarthritis, right hip.  2. Hypertension.  3. History of atrial fibrillation.  4. History of elevated uric acid.   PLAN:  The patient admitted to [facility] to undergo a  right total hip arthroplasty. Surgery will be performed by Dr. Homero Fellers  Aluisio.      Alexzandrew L. Julien Girt, P.A.      Ollen Gross, M.D.  Electronically Signed    ALP/MEDQ  D:  05/05/2006  T:  05/05/2006  Job:  [id number]   cc:   Darlin Priestly, MD  Fax: [phone]   [name]  Fax: [phone]   Ollen Gross, M.D.  Fax: [phone]

## 2011-02-23 NOTE — Discharge Summary (Signed)
NAME:  [name], [name]                       ACCOUNT NO.:  [id number]   MEDICAL RECORD NO.:  [id number]                   PATIENT TYPE:  INP   LOCATION:  2034                                 FACILITY:  [facility]   PHYSICIAN:  Darlin Priestly, M.D.             DATE OF BIRTH:  [birth year]   DATE OF ADMISSION:  03/27/2004  DATE OF DISCHARGE:  03/31/2004                           DISCHARGE SUMMARY - REFERRING   ADMISSION DIAGNOSES:  1. Atrial fibrillation.  2. 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   DISCHARGE DIAGNOSES:  1. Atrial fibrillation.  2. 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   HISTORY OF PRESENT ILLNESS:  The patient is a 65 year old white male with  known history of coronary artery disease.  He has chronic atrial  fibrillation first diagnosed in 2000.  He has been on Coumadin, Cardizem and  Coreg.  His rate is well controlled.  He was last seen by Dr. Jenne Campus on Feb 10, 2004, and it was Dr. Mikey Bussing opinion that he had been therapeutic on  amiodarone and Coumadin for some time and should be admitted for cardiac  catheterization and then TEE cardioversion.   CURRENT MEDICATIONS AT HOME:  1. Coumadin 6 mg daily.  2. Allopurinol 300 mg daily.  3. Diltiazem CD 240 mg daily.  4. Hydrochlorothiazide 12.5 mg daily.  5. Coreg 12.5 mg b.i.d.   His hospital admission orders listed some additional medications.  These  included:  1. Allopurinol 300 mg daily.  2. Multivitamins daily.  3. Amiodarone 200 mg daily.  4. Diovan 80 mg daily.  5. Cardizem 240 mg daily.  6. Coreg 12.5 mg daily.   ALLERGIES:  No known drug allergies.   For further history and physical, please see the dictated note.   HOSPITAL COURSE:  The patient was admitted and on March 28, 2004, underwent  cardiac catheterization by Dr. Jenne Campus.  This showed normal coronary  arteries.  The patient had a left  ventriculogram which revealed severely  depressed EF of 25% and global hypokinesis.  The patient tolerated the  procedure well and returned to his room the following day.  I underwent a  TEE cardioversion.  The TEE showed the LV was dilated with moderate to  severely depressed LV systolic function.  EF was 30%. There was obstruction  of the aortic valve, obstruction of the mitral valve with mild mitral  regurgitation, obstruction of the tricuspid valve with mild tricuspid  regurgitation, no evidence of intracardiac mass or thrombus.  There was  moderate amount of spontaneous echo contrast noted in the left atrium and  the left atrial appendage as well as LV consistent with sluggish flow. There  was a normal descending thoracic aorta.  Upon completion, the patient  underwent biphasic  shock with 150 joules and was restored to a sinus  bradycardia.  Since  his cardioversion, he has remained in a sinus rhythm and  was somewhat bradycardic.  His medicines have been adjusted and by the a.m.  of March 31, 2004, it was Dr. Mikey Bussing opinion that he could be discharged  home.   DISCHARGE MEDICATIONS:  1. Lovenox 90 mg q.12h. and he has been instructed how to use the Lovenox.  2. Coumadin 6 mg on Friday, Saturday, and Sunday.  He is to have his protime     checked on Monday.  3. We are going to place him on Diovan 80 mg one daily.  4. The Cardizem has been adjusted to 180 mg daily.  He has two doses of     that.  5. Amiodarone 200 mg daily.  6. His Coreg was increased to 25 mg q.12h.  7. He can resume his allopurinol, colchicine and vitamin as before.  8. He was placed on digoxin directly after his catheterization but this was     discontinued and not restarted.  9. He was placed on Norvasc after his catheterization but has not received     this.  10.      He has hydrochlorothiazide ordered but it has not been given.   Will see him back in the office in two weeks and allow Dr. Jenne Campus to   readjust his medicines based on his blood pressure and his heart rate at  that time.  He is scheduled to have his protime checked on Monday and I have  instructed him to give me a call when he is there if he is had any problems  over the weekend.      [name], P.A.                 Darlin Priestly, M.D.    WDJ/MEDQ  D:  03/31/2004  T:  03/31/2004  Job:  [id number]   cc:   [name]  [address] [place]  [place] [address]  Fax: [phone]

## 2011-02-23 NOTE — Op Note (Signed)
NAME:  [name], [name] NO.:  [id number]   MEDICAL RECORD NO.:  [id number]          PATIENT TYPE:  INP   LOCATION:  0011                         FACILITY:  [facility]   PHYSICIAN:  Ollen Gross, M.D.    DATE OF BIRTH:  21-Sep-1946   DATE OF PROCEDURE:  03/25/2006  DATE OF DISCHARGE:                                 OPERATIVE REPORT   PREOPERATIVE DIAGNOSIS:  Osteoarthritis right hip.   POSTOPERATIVE DIAGNOSIS:  Osteoarthritis right hip.   PROCEDURE:  Right total hip arthroplasty.   SURGEON:  Ollen Gross, M.D.   ASSISTANT:  Alexzandrew L. Julien Girt, P.A.   ANESTHESIA:  General   ESTIMATED BLOOD LOSS:  200 mL   DRAINS:  Hemovac x1.   COMPLICATIONS:  None.   CONDITION:  Stable to recovery.   BRIEF CLINICAL NOTE:  [name] is a 65 year old male with end-stage  arthritis of the right hip with intractable pain.  He has failed  nonoperative management including injection and presents for total hip  arthroplasty.   PROCEDURE IN DETAIL:  After successful administration of general anesthetic,  the patient is placed in the left lateral decubitus position with the right  side up and held with the hip positioner.  Right lower extremity was  isolated from his perineum with plastic drapes and prepped and draped in  usual sterile fashion.  Short posterolateral incision is made with a 10  blade through subcutaneous tissue to the level of fascia lata which is  incised in line with the skin incision.  Sciatic nerve is palpated and  protected and a short rotator is isolated off the femur.  Capsulectomy is  performed and the hip is dislocated.  He had severe flattening of the  femoral head and a large groove through the center.  The center head is  marked and a trial prosthesis placed such that the center of the trial head  corresponds to center of his native femoral head.  Osteotomy lines marked on  the femoral neck and osteotomy made with an oscillating saw.  Femoral  head  is removed and then the femur retracted anteriorly to gain acetabular  exposure.   The acetabular labrum and osteophytes are removed.  There is a tremendous  amount of hypertrophic soft tissue in the fovea which is removed.  Acetabular reaming starts at 47 mm coursing increments of 2 up to 55 mm and  then a 56 mm pinnacle acetabular shell is placed in anatomic position and  transfixed with two dome screws.  Trial 40 mm neutral liner is placed.   The femur is repaired with the canal finder and irrigation.  Axial reaming  is performed to 13.5 mm proximal reaming to a 70F and a sleeve machine to an  extra-extra large.  An 12 French extra-extra large sleeve the trial is  placed with an 18 x 13 stem and a 36 plus 12 neck.  We went about 10-15  degrees beyond his native anteversion.  The 40 plus 0 trial head is placed.  Hip is reduced with great stability, full extension, flexion rotation, 70  degrees  flexion, 40 degrees adduction, 90 degrees internal rotation, 90  degrees of flexion and 70 degrees of internal rotation.  By placing the  right leg on top of the left I felt as though the leg lengths are equal.   The hip is dislocated and all trials are removed.  The permanent apex hole  eliminator is placed into the acetabular shell then the permanent 40 mm  Ultamet neutral metal liner is placed.  On the femoral side, we placed 18  [place] extra-extra large sleeve with an 18 x 13 stem and 36 plus 12 neck  again about 10-15 degrees beyond native anteversion.  The 40 plus 0 head is  placed and the hip is reduced to the same stability parameters.  The wound  is copiously irrigated with saline solution and the short rotator is  reattached to the femur through drill holes.  Fascia lata is closed over a  Hemovac drain with interrupted #1 Vicryl, subcu closed #1-0 and #2-0 Vicryl  and subcuticular running 4-0 Monocryl.  The incision is clean and dry and  Steri-Strips and bulky sterile dressing  applied.  He is then awakened and  transported to recovery in stable condition.      Ollen Gross, M.D.  Electronically Signed     FA/MEDQ  D:  03/25/2006  T:  03/26/2006  Job:  [id number]

## 2011-04-26 ENCOUNTER — Encounter: Payer: Self-pay | Admitting: Cardiovascular Disease

## 2012-03-28 LAB — HM HEPATITIS C SCREENING LAB: HM Hepatitis Screen: NEGATIVE

## 2012-05-10 ENCOUNTER — Emergency Department: Payer: Self-pay | Admitting: *Deleted

## 2012-06-06 ENCOUNTER — Encounter: Payer: Self-pay | Admitting: Cardiothoracic Surgery

## 2012-06-06 ENCOUNTER — Encounter: Payer: Self-pay | Admitting: Nurse Practitioner

## 2012-06-08 ENCOUNTER — Encounter: Payer: Self-pay | Admitting: Nurse Practitioner

## 2012-06-08 ENCOUNTER — Encounter: Payer: Self-pay | Admitting: Cardiothoracic Surgery

## 2012-07-08 ENCOUNTER — Encounter: Payer: Self-pay | Admitting: Nurse Practitioner

## 2012-07-08 ENCOUNTER — Encounter: Payer: Self-pay | Admitting: Cardiothoracic Surgery

## 2012-08-08 ENCOUNTER — Encounter: Payer: Self-pay | Admitting: Nurse Practitioner

## 2012-08-08 ENCOUNTER — Encounter: Payer: Self-pay | Admitting: Cardiothoracic Surgery

## 2012-09-07 ENCOUNTER — Encounter: Payer: Self-pay | Admitting: Cardiothoracic Surgery

## 2012-09-07 ENCOUNTER — Encounter: Payer: Self-pay | Admitting: Nurse Practitioner

## 2012-10-08 ENCOUNTER — Encounter: Payer: Self-pay | Admitting: Nurse Practitioner

## 2012-10-08 ENCOUNTER — Encounter: Payer: Self-pay | Admitting: Cardiothoracic Surgery

## 2012-12-24 ENCOUNTER — Ambulatory Visit: Payer: Self-pay | Admitting: Cardiology

## 2012-12-24 DIAGNOSIS — I4891 Unspecified atrial fibrillation: Secondary | ICD-10-CM

## 2012-12-24 DIAGNOSIS — Z7901 Long term (current) use of anticoagulants: Secondary | ICD-10-CM | POA: Insufficient documentation

## 2013-03-26 ENCOUNTER — Ambulatory Visit (INDEPENDENT_AMBULATORY_CARE_PROVIDER_SITE_OTHER): Payer: BC Managed Care – PPO | Admitting: Pharmacist Clinician (PhC)/ Clinical Pharmacy Specialist

## 2013-03-26 VITALS — BP 128/84 | HR 72

## 2013-03-26 DIAGNOSIS — I4891 Unspecified atrial fibrillation: Secondary | ICD-10-CM

## 2013-03-26 DIAGNOSIS — Z7901 Long term (current) use of anticoagulants: Secondary | ICD-10-CM

## 2013-03-26 LAB — POCT INR: INR: 2.6

## 2013-05-07 ENCOUNTER — Telehealth: Payer: Self-pay | Admitting: Pharmacist

## 2013-05-07 NOTE — Telephone Encounter (Signed)
Amber with Dr. Shelle Iron and [facility] called.  Mr Roedl needs a colonoscopy and needs clearance to hold Coumadin.  Please call Amber back with clearance so procedure can be scheduled.  (phone: [phone])

## 2013-05-07 NOTE — Telephone Encounter (Signed)
He has chronic A fib -- ok to hold Coumadin for procedure.  Marykay Lex, MD

## 2013-05-07 NOTE — Telephone Encounter (Signed)
Returned call and spoke w/ Hospital doctor.  Informed per instructions by Dr. Herbie Baltimore.  Verbalized understanding.

## 2013-05-28 ENCOUNTER — Telehealth: Payer: Self-pay | Admitting: Pharmacist Clinician (PhC)/ Clinical Pharmacy Specialist

## 2013-05-28 ENCOUNTER — Ambulatory Visit: Payer: Self-pay | Admitting: Gastroenterology

## 2013-05-28 HISTORY — PX: COLONOSCOPY: SHX174

## 2013-05-28 LAB — HM COLONOSCOPY

## 2013-05-28 NOTE — Telephone Encounter (Signed)
Please call asap-need to know how to start back taking his coumadin-went off for endo.

## 2013-05-28 NOTE — Telephone Encounter (Signed)
Restart warfarin tonight (endoscopy this AM) at 6mg  daily, repeat INR in 2 weeks.  Pt voiced understanding

## 2013-06-22 ENCOUNTER — Telehealth: Payer: Self-pay | Admitting: Pharmacist Clinician (PhC)/ Clinical Pharmacy Specialist

## 2013-06-22 ENCOUNTER — Other Ambulatory Visit: Payer: Self-pay | Admitting: Pharmacist Clinician (PhC)/ Clinical Pharmacy Specialist

## 2013-06-22 MED ORDER — WARFARIN SODIUM 6 MG PO TABS: 6.0000 mg | ORAL_TABLET | Freq: Every day | ORAL | Status: DC

## 2013-06-22 NOTE — Telephone Encounter (Signed)
Overdue INR letter sent 

## 2013-06-23 ENCOUNTER — Telehealth: Payer: Self-pay | Admitting: Cardiology

## 2013-06-23 NOTE — Telephone Encounter (Signed)
Need a new prescription with Dr.[name] , Patient is out of medication .Marland Kitchen Former Dr.Little Patient.[name] at [facility] says she faxed it over 2 days ago .   Thanks

## 2013-06-24 ENCOUNTER — Other Ambulatory Visit: Payer: Self-pay | Admitting: Cardiology

## 2013-06-24 NOTE — Telephone Encounter (Signed)
Returned call.  Message left asking that hardy copy request be faxed to [phone] w/ name of medication needing to be refilled.

## 2013-06-25 ENCOUNTER — Telehealth: Payer: Self-pay | Admitting: Cardiology

## 2013-06-25 NOTE — Telephone Encounter (Signed)
Received his Warfarin-it had no refills-Please send him some refiills in.

## 2013-06-26 NOTE — Telephone Encounter (Signed)
Returned call.  Left message to call back before 4pm and to call for appt w/ Belenda Cruise (PharmD) for refills.

## 2013-07-06 ENCOUNTER — Ambulatory Visit (INDEPENDENT_AMBULATORY_CARE_PROVIDER_SITE_OTHER): Payer: BC Managed Care – PPO | Admitting: Pharmacist Clinician (PhC)/ Clinical Pharmacy Specialist

## 2013-07-06 VITALS — BP 118/76 | HR 72

## 2013-07-06 DIAGNOSIS — I4891 Unspecified atrial fibrillation: Secondary | ICD-10-CM

## 2013-07-06 DIAGNOSIS — Z7901 Long term (current) use of anticoagulants: Secondary | ICD-10-CM

## 2013-07-06 MED ORDER — WARFARIN SODIUM 6 MG PO TABS: ORAL_TABLET | ORAL | Status: DC

## 2013-08-17 ENCOUNTER — Ambulatory Visit: Payer: PRIVATE HEALTH INSURANCE | Admitting: Pharmacist Clinician (PhC)/ Clinical Pharmacy Specialist

## 2013-08-18 ENCOUNTER — Ambulatory Visit (INDEPENDENT_AMBULATORY_CARE_PROVIDER_SITE_OTHER): Payer: Medicare Other | Admitting: Pharmacist Clinician (PhC)/ Clinical Pharmacy Specialist

## 2013-08-18 VITALS — BP 110/88 | HR 72

## 2013-08-18 DIAGNOSIS — I4891 Unspecified atrial fibrillation: Secondary | ICD-10-CM

## 2013-08-18 DIAGNOSIS — Z7901 Long term (current) use of anticoagulants: Secondary | ICD-10-CM

## 2013-09-29 ENCOUNTER — Ambulatory Visit (INDEPENDENT_AMBULATORY_CARE_PROVIDER_SITE_OTHER): Payer: Medicare Other | Admitting: Pharmacist Clinician (PhC)/ Clinical Pharmacy Specialist

## 2013-09-29 VITALS — BP 114/84 | HR 72

## 2013-09-29 DIAGNOSIS — Z7901 Long term (current) use of anticoagulants: Secondary | ICD-10-CM

## 2013-09-29 DIAGNOSIS — I4891 Unspecified atrial fibrillation: Secondary | ICD-10-CM

## 2013-10-29 ENCOUNTER — Encounter: Payer: Self-pay | Admitting: *Deleted

## 2013-10-30 ENCOUNTER — Encounter: Payer: Self-pay | Admitting: Cardiology

## 2013-11-02 ENCOUNTER — Encounter: Payer: Self-pay | Admitting: Cardiology

## 2013-11-02 ENCOUNTER — Ambulatory Visit (INDEPENDENT_AMBULATORY_CARE_PROVIDER_SITE_OTHER): Payer: Medicare Other | Admitting: Cardiology

## 2013-11-02 ENCOUNTER — Ambulatory Visit (INDEPENDENT_AMBULATORY_CARE_PROVIDER_SITE_OTHER): Payer: Medicare Other | Admitting: Pharmacist Clinician (PhC)/ Clinical Pharmacy Specialist

## 2013-11-02 VITALS — BP 138/78 | HR 81 | Ht 75.0 in | Wt 223.4 lb

## 2013-11-02 DIAGNOSIS — I8310 Varicose veins of unspecified lower extremity with inflammation: Secondary | ICD-10-CM

## 2013-11-02 DIAGNOSIS — I4891 Unspecified atrial fibrillation: Secondary | ICD-10-CM

## 2013-11-02 DIAGNOSIS — Z01810 Encounter for preprocedural cardiovascular examination: Secondary | ICD-10-CM

## 2013-11-02 DIAGNOSIS — I482 Chronic atrial fibrillation, unspecified: Secondary | ICD-10-CM

## 2013-11-02 DIAGNOSIS — Z7901 Long term (current) use of anticoagulants: Secondary | ICD-10-CM

## 2013-11-02 DIAGNOSIS — I10 Essential (primary) hypertension: Secondary | ICD-10-CM

## 2013-11-02 DIAGNOSIS — I872 Venous insufficiency (chronic) (peripheral): Secondary | ICD-10-CM | POA: Insufficient documentation

## 2013-11-02 LAB — POCT INR: INR: 2.1

## 2013-11-02 NOTE — Progress Notes (Signed)
PATIENT: [name] MRN: [id number]  DOB: 1945/10/31   DOV:11/02/2013 PCP: Clydell Hakim, MD  Clinic Note: Chief Complaint  Patient presents with  . Annual Exam    no chest pain , sob at activity walking up steps and ending over,no edema   HPI: [name] is a 68 y.o.  male with a PMH below who presents today for chronic Atrial Fibrillation. He was initially diagnosed in June 2005 when he presented with rapid A. fib and nonischemic cardiomyopathy. Cardiac catheterization which showed no evidence of coronary disease. At that time his EF was estimated at roughly 30% with global hypokinesis.  He underwent TEE guided cardioversion, and did quite well for a while until he had a cold and took some decongestant medicine that caused him to to go back in A. fib again. Since then as far as I can tell he has been in chronic stable atrial fibrillation. He has an irregularly warfarin, and having no difficulties. His repeat echocardiogram in November 2009 showed an improved EF of greater than 55% with moderately dilated right atrium and ventricle as well as severely dilated left atrium. He has been rate controlled since. His only major symptoms he is noted is that he gets dyspneic climbing stairs and bending over in the morning tying shoes. Otherwise he is very active and exercises almost 6 days a week. He says out of the 365 days last year he worked out 348 days.  Interval History: [name] returns today without any complaints. He denies a sensation of irregular or rapid heartbeats. He denies any exertional dyspnea with his routine exercising, but does get short of breath going up a couple flights of stairs. He says the dyspnea with bending over to tie shoes in the morning is improved since he's lost some weight. He otherwise denies any chest pressure with rest or exertion. No PND, orthopnea, or edema. He had had some edema saw him but that is not improved. He still has venous stasis dermatitis  to but no significant edema now. No lightheadedness, dizziness or wooziness. No syncope or near syncope no TIA or RCA symptoms. No malignancy or hematuria.   He also denies any claudication.  When I saw her last time he was noting some erectile dysfunction. Currently undergoing evaluation by [facility] for a penile implant prosthesis.   Past Medical History  Diagnosis Date  . Chronic atrial fibrillation   . Echocardiogram abnormal     2009 moderate to severely dilated left atrium  . Hypertension   . Venous stasis     chronic  . Erectile dysfunction     Prior Cardiac Evaluation and Past Surgical History: Past Surgical History  Procedure Laterality Date  . Pilonidal cyst excision    . Lumbar fusion  age 13  . Cardiac catheterization    . Hernia repair  age 77  . Right eyebrow tumor removed  age 72   . Myoview cardiovascular stress test  07/2003    anterior wall thinning, LV systolic function depressed at 33%  . Cardiac catheterization  03/28/2004    normal coronaries, reduced EF at 25-30% (Dr. Laurell Josephs)  . Cardioversion, tee guided  03/29/2004    Dr. Laurell Josephs   . Knee surgery    . 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 Carmon Ginsberg. Aluisio  . Transthoracic echocardiogram  08/2008    EF=>55%, mild-mod dilated; LA mod-severely dilated; RA mild-mod dilated; mild calcif of MV, borderline MVP, trace MR; trace TR;  mild pulm valve regurg     Allergies  Allergen Reactions  . Lotrel [Amlodipine Besy-Benazepril Hcl]   . Norvasc [Amlodipine Besylate]     Current Outpatient Prescriptions  Medication Sig Dispense Refill  . allopurinol (ZYLOPRIM) 300 MG tablet Take 300 mg by mouth as needed.        . carvedilol (COREG) 25 MG tablet Take 25 mg by mouth 2 (two) times daily with a meal.        . cloNIDine (CATAPRES) 0.1 MG tablet Take 0.05 mg by mouth daily.        . colchicine 0.6 MG tablet Take 0.6 mg by mouth as needed.        . hydrochlorothiazide (MICROZIDE) 12.5 MG capsule  Take 12.5 mg by mouth daily.       . Multiple Vitamin (MULTIVITAMIN) tablet Take 1 tablet by mouth daily.        Marland Kitchen testosterone cypionate (DEPOTESTOTERONE CYPIONATE) 200 MG/ML injection       . warfarin (COUMADIN) 6 MG tablet Take 1 tablet by mouth daily or as directed  30 tablet  5  . benazepril (LOTENSIN) 20 MG tablet Take 20 mg by mouth daily.        No current facility-administered medications for this visit.    History   Social History Narrative   Divorced father of 1. Retired Engineer, site.   ~2 beers / day. Does not smoke.   Works out 6 days a weeks - cardio & weights. He used to run and play basketball prior to his hip surgery in 2007.    ROS: A comprehensive Review of Systems - Negative except Mild arthralgias in his knees.  PHYSICAL EXAM BP 138/78  Pulse 81  Ht 6\' 3"  (1.905 m)  Wt 223 lb 6.4 oz (101.334 kg)  BMI 27.92 kg/m2 General:, he is a very pleasant, healthy-appearing gentleman; well-groomed, well-nourished, in no acute distress. He is deeply tanned, A&O  x3, answers questions appropriately.  HEENT: NCAT. EOMI. MMM. Anicteric sclerae.  Neck: Supple. No LAN, JVD or carotid bruit.  Lungs: CTAB. Nonlabored. Normal effort. Good air movement.  Heart: Irregularly irregular. Normal S1, S2. No M/R/G. Nondisplaced PMI. I do not hear a right-sided carotid bruit that Dr.Little heard.  Abdomen is soft, NT, ND. NABS. No HSM.  Extremities: No C/C, but trace edema on the left side.  NWG:NFAOZHYQM today: Yes Rate:81 , Rhythm: A. Fib; left axis deviation;nonspecific IVCD;  Recent Labs: none  ASSESSMENT / PLAN: Chronic atrial fibrillation Asymptomatic. Most recent evaluation of his EF showed return to normal EF. His rate controlled on a beta blocker. No active symptoms and no sensation of being in A. fib. He asked about cardioversion, think at this point the likelihood of keeping him out of A. fib without an antiarrhythmic is very low. We decided that rate control is probably  the best option for him.  HYPERTENSION, BENIGN Well-controlled. On combination ACE inhibitor/60 as well as beta blocker.  Venous stasis dermatitis Stable with minimal edema.  Long term (current) use of anticoagulants Routine INR checks followed here.  He continues to remain stable  Pre-operative cardiovascular examination Preoperative evaluation for now prosthesis. No active cardiac symptoms. Low Risk Patient for Low Risk Procedure. No need for preprocedural evaluation. Of course his warfarin dose he can stop his Coumadin 5 days prior to the procedure and then restart it the day of or the day after depending upon the surgeon's discretion.    Orders Placed This Encounter  Procedures  .  EKG 12-Lead   Followup: one year  Shilpa Bushee W. Herbie Baltimore, M.D., M.S. [facility] [address] [place], [place]  [address]  [phone] Pager # [phone]

## 2013-11-02 NOTE — Patient Instructions (Signed)
You are doing great!!  Blood Pressure & Heart Rate are great. Keep up the exercising.  I will prepare a letter for Dr. Elnoria Howard [facility] with instructions about your upcoming procedure.  Stop Coumadin/warfarin 5 days prior to the procedure - restart that night or the following evening dependent upon the Urologist.  I will see you back in 1 yr.  Leonie Man, MD  Your physician wants you to follow-up in: 1 yr. You will receive a reminder letter in the mail two months in advance. If you don't receive a letter, please call our office to schedule the follow-up appointment.

## 2013-11-02 NOTE — Assessment & Plan Note (Signed)
Well-controlled. On combination ACE inhibitor/60 as well as beta blocker.

## 2013-11-02 NOTE — Assessment & Plan Note (Signed)
Preoperative evaluation for now prosthesis. No active cardiac symptoms. Low Risk Patient for Low Risk Procedure. No need for preprocedural evaluation. Of course his warfarin dose he can stop his Coumadin 5 days prior to the procedure and then restart it the day of or the day after depending upon the surgeon's discretion.

## 2013-11-02 NOTE — Assessment & Plan Note (Signed)
Routine INR checks followed here.  He continues to remain stable

## 2013-11-02 NOTE — Assessment & Plan Note (Signed)
Stable with minimal edema.

## 2013-11-02 NOTE — Assessment & Plan Note (Signed)
Asymptomatic. Most recent evaluation of his EF showed return to normal EF. His rate controlled on a beta blocker. No active symptoms and no sensation of being in A. fib. He asked about cardioversion, think at this point the likelihood of keeping him out of A. fib without an antiarrhythmic is very low. We decided that rate control is probably the best option for him.

## 2013-11-23 ENCOUNTER — Other Ambulatory Visit: Payer: Self-pay | Admitting: Cardiology

## 2013-11-25 ENCOUNTER — Telehealth: Payer: Self-pay | Admitting: Cardiology

## 2013-11-25 NOTE — Telephone Encounter (Signed)
Returned call to patient. Stated that he received call from automated service. RN does not see any documentation regarding a call.

## 2013-11-25 NOTE — Telephone Encounter (Signed)
Per answering service: Returning call. Pt did not state who the call was from.

## 2013-12-02 ENCOUNTER — Telehealth: Payer: Self-pay | Admitting: Cardiology

## 2013-12-02 MED ORDER — WARFARIN SODIUM 6 MG PO TABS: ORAL_TABLET | ORAL | Status: DC

## 2013-12-02 NOTE — Telephone Encounter (Signed)
Need a new prescription written by Dr Ellyn Hack for Medicare purpose. The prescription is for Warfarin 6 mg #30 Please call this today

## 2013-12-08 ENCOUNTER — Ambulatory Visit: Payer: Self-pay | Admitting: Urology

## 2013-12-08 DIAGNOSIS — I10 Essential (primary) hypertension: Secondary | ICD-10-CM

## 2013-12-08 DIAGNOSIS — I4891 Unspecified atrial fibrillation: Secondary | ICD-10-CM

## 2013-12-08 LAB — CBC WITH DIFFERENTIAL/PLATELET
Basophil #: 0 10*3/uL (ref 0.0–0.1)
Basophil %: 0.8 %
Eosinophil #: 0.2 10*3/uL (ref 0.0–0.7)
Eosinophil %: 3 %
HCT: 46.8 % (ref 40.0–52.0)
HGB: 15.7 g/dL (ref 13.0–18.0)
LYMPHS ABS: 0.8 10*3/uL — AB (ref 1.0–3.6)
Lymphocyte %: 13.4 %
MCH: 33.8 pg (ref 26.0–34.0)
MCHC: 33.5 g/dL (ref 32.0–36.0)
MCV: 101 fL — ABNORMAL HIGH (ref 80–100)
MONO ABS: 0.7 x10 3/mm (ref 0.2–1.0)
MONOS PCT: 11.4 %
Neutrophil #: 4.4 10*3/uL (ref 1.4–6.5)
Neutrophil %: 71.4 %
Platelet: 234 10*3/uL (ref 150–440)
RBC: 4.64 10*6/uL (ref 4.40–5.90)
RDW: 14.9 % — AB (ref 11.5–14.5)
WBC: 6.2 10*3/uL (ref 3.8–10.6)

## 2013-12-08 LAB — PROTIME-INR
INR: 1.7
PROTHROMBIN TIME: 19.6 s — AB (ref 11.5–14.7)

## 2013-12-14 ENCOUNTER — Ambulatory Visit (INDEPENDENT_AMBULATORY_CARE_PROVIDER_SITE_OTHER): Payer: Medicare Other | Admitting: Pharmacist Clinician (PhC)/ Clinical Pharmacy Specialist

## 2013-12-14 VITALS — BP 144/82 | HR 84

## 2013-12-14 DIAGNOSIS — Z7901 Long term (current) use of anticoagulants: Secondary | ICD-10-CM

## 2013-12-14 DIAGNOSIS — I482 Chronic atrial fibrillation, unspecified: Secondary | ICD-10-CM

## 2013-12-14 DIAGNOSIS — I4891 Unspecified atrial fibrillation: Secondary | ICD-10-CM

## 2013-12-14 LAB — POCT INR: INR: 2.5

## 2013-12-14 MED ORDER — WARFARIN SODIUM 6 MG PO TABS: ORAL_TABLET | ORAL | Status: DC

## 2013-12-17 ENCOUNTER — Telehealth: Payer: Self-pay | Admitting: Cardiology

## 2013-12-17 MED ORDER — HYDROCHLOROTHIAZIDE 25 MG PO TABS: 25.0000 mg | ORAL_TABLET | Freq: Every day | ORAL | Status: DC

## 2013-12-17 NOTE — Telephone Encounter (Signed)
Call to pharmacy and spoke w/ Judson Roch.  Cancelled script for HCTZ 12.5 mg tabs.

## 2013-12-17 NOTE — Telephone Encounter (Signed)
Please call,concerning his HCTZ. Prescription was different and now he is gaining weight and his ankles are swollen.

## 2013-12-17 NOTE — Telephone Encounter (Signed)
Returned call and pt verified x 2.  Pt stated he has been taking HCTZ 12.5 mg twice daily and his last few prescriptions say to take once daily.  Pt c/o 18 lbs weight gain, HAs, swelling and increased BP.  Stated BP at MD appt today was >150/94.  Pt would like to switch back to 12.5 mg twice daily.  Informed Dr. Ellyn Hack will be notified for further instructions.  Pt verbalized understanding and agreed w/ plan.  Dr. Ellyn Hack notified and advised pt take 25 mg daily.

## 2013-12-21 ENCOUNTER — Ambulatory Visit: Payer: Self-pay | Admitting: Urology

## 2013-12-21 LAB — PROTIME-INR
INR: 1.1
Prothrombin Time: 14.5 secs (ref 11.5–14.7)

## 2014-01-06 ENCOUNTER — Other Ambulatory Visit: Payer: Self-pay | Admitting: *Deleted

## 2014-01-06 MED ORDER — BENAZEPRIL HCL 20 MG PO TABS: 20.0000 mg | ORAL_TABLET | Freq: Every day | ORAL | Status: DC

## 2014-01-06 NOTE — Telephone Encounter (Signed)
Rx refill sent to patients pharmacy  

## 2014-01-21 ENCOUNTER — Other Ambulatory Visit: Payer: Self-pay | Admitting: *Deleted

## 2014-01-21 MED ORDER — CARVEDILOL 25 MG PO TABS: 25.0000 mg | ORAL_TABLET | Freq: Two times a day (BID) | ORAL | Status: DC

## 2014-01-21 NOTE — Telephone Encounter (Signed)
Rx was sent to pharmacy electronically. 

## 2014-01-25 ENCOUNTER — Telehealth: Payer: Self-pay | Admitting: Cardiology

## 2014-01-25 MED ORDER — CARVEDILOL 12.5 MG PO TABS: 12.5000 mg | ORAL_TABLET | Freq: Two times a day (BID) | ORAL | Status: DC

## 2014-01-25 MED ORDER — BENAZEPRIL HCL 20 MG PO TABS: 20.0000 mg | ORAL_TABLET | Freq: Two times a day (BID) | ORAL | Status: DC

## 2014-01-25 NOTE — Telephone Encounter (Signed)
LMTCB

## 2014-01-25 NOTE — Telephone Encounter (Signed)
Please call-when he picked up his Carvedilol yesterday the mg was wrong.

## 2014-01-25 NOTE — Telephone Encounter (Signed)
Refill what the Pharmacy had them on -- then correct the chart.   Leonie Man, MD

## 2014-01-25 NOTE — Telephone Encounter (Signed)
Returning your call. °

## 2014-01-25 NOTE — Telephone Encounter (Signed)
Rx was sent to pharmacy electronically. Call to patient to clarify carvedilol. Patient has been taking 12.5mg  BID (although OV from Jan states 25mg  QD) Also states he has been taking 20mg  benazepril BID (although OV from Jan states QD) Updated medication list and will inform Dr. Ellyn Hack

## 2014-01-27 ENCOUNTER — Ambulatory Visit (INDEPENDENT_AMBULATORY_CARE_PROVIDER_SITE_OTHER): Payer: Medicare Other | Admitting: Pharmacist Clinician (PhC)/ Clinical Pharmacy Specialist

## 2014-01-27 DIAGNOSIS — I4891 Unspecified atrial fibrillation: Secondary | ICD-10-CM

## 2014-01-27 DIAGNOSIS — Z7901 Long term (current) use of anticoagulants: Secondary | ICD-10-CM

## 2014-01-27 DIAGNOSIS — I482 Chronic atrial fibrillation, unspecified: Secondary | ICD-10-CM

## 2014-01-27 LAB — POCT INR: INR: 2.6

## 2014-03-08 ENCOUNTER — Ambulatory Visit (INDEPENDENT_AMBULATORY_CARE_PROVIDER_SITE_OTHER): Payer: Medicare Other | Admitting: Pharmacist Clinician (PhC)/ Clinical Pharmacy Specialist

## 2014-03-08 VITALS — BP 120/78 | HR 76

## 2014-03-08 DIAGNOSIS — I4891 Unspecified atrial fibrillation: Secondary | ICD-10-CM

## 2014-03-08 DIAGNOSIS — I482 Chronic atrial fibrillation, unspecified: Secondary | ICD-10-CM

## 2014-03-08 DIAGNOSIS — Z7901 Long term (current) use of anticoagulants: Secondary | ICD-10-CM

## 2014-03-08 LAB — POCT INR: INR: 1.7

## 2014-04-05 ENCOUNTER — Ambulatory Visit (INDEPENDENT_AMBULATORY_CARE_PROVIDER_SITE_OTHER): Payer: Medicare Other | Admitting: Pharmacist Clinician (PhC)/ Clinical Pharmacy Specialist

## 2014-04-05 DIAGNOSIS — Z7901 Long term (current) use of anticoagulants: Secondary | ICD-10-CM

## 2014-04-05 DIAGNOSIS — I482 Chronic atrial fibrillation, unspecified: Secondary | ICD-10-CM

## 2014-04-05 DIAGNOSIS — I4891 Unspecified atrial fibrillation: Secondary | ICD-10-CM

## 2014-04-05 LAB — POCT INR: INR: 1.8

## 2014-04-14 ENCOUNTER — Ambulatory Visit: Payer: Self-pay | Admitting: Urology

## 2014-04-26 ENCOUNTER — Ambulatory Visit (INDEPENDENT_AMBULATORY_CARE_PROVIDER_SITE_OTHER): Payer: Medicare Other | Admitting: Pharmacist Clinician (PhC)/ Clinical Pharmacy Specialist

## 2014-04-26 DIAGNOSIS — I4891 Unspecified atrial fibrillation: Secondary | ICD-10-CM

## 2014-04-26 DIAGNOSIS — I482 Chronic atrial fibrillation, unspecified: Secondary | ICD-10-CM

## 2014-04-26 DIAGNOSIS — Z7901 Long term (current) use of anticoagulants: Secondary | ICD-10-CM

## 2014-04-26 LAB — POCT INR: INR: 3.2

## 2014-05-24 ENCOUNTER — Ambulatory Visit (INDEPENDENT_AMBULATORY_CARE_PROVIDER_SITE_OTHER): Payer: Medicare Other | Admitting: Pharmacist Clinician (PhC)/ Clinical Pharmacy Specialist

## 2014-05-24 DIAGNOSIS — Z7901 Long term (current) use of anticoagulants: Secondary | ICD-10-CM

## 2014-05-24 DIAGNOSIS — I482 Chronic atrial fibrillation, unspecified: Secondary | ICD-10-CM

## 2014-05-24 DIAGNOSIS — I4891 Unspecified atrial fibrillation: Secondary | ICD-10-CM

## 2014-05-24 LAB — POCT INR: INR: 2.3

## 2014-06-19 ENCOUNTER — Other Ambulatory Visit: Payer: Self-pay | Admitting: Cardiology

## 2014-06-21 ENCOUNTER — Ambulatory Visit (INDEPENDENT_AMBULATORY_CARE_PROVIDER_SITE_OTHER): Payer: Medicare Other | Admitting: Pharmacist Clinician (PhC)/ Clinical Pharmacy Specialist

## 2014-06-21 DIAGNOSIS — I482 Chronic atrial fibrillation, unspecified: Secondary | ICD-10-CM

## 2014-06-21 DIAGNOSIS — I4891 Unspecified atrial fibrillation: Secondary | ICD-10-CM

## 2014-06-21 DIAGNOSIS — Z7901 Long term (current) use of anticoagulants: Secondary | ICD-10-CM

## 2014-06-21 LAB — POCT INR: INR: 2

## 2014-06-21 NOTE — Telephone Encounter (Signed)
Rx was sent to pharmacy electronically. 

## 2014-07-19 ENCOUNTER — Ambulatory Visit: Payer: Medicare Other | Admitting: Pharmacist Clinician (PhC)/ Clinical Pharmacy Specialist

## 2014-07-23 ENCOUNTER — Emergency Department (HOSPITAL_COMMUNITY): Payer: Medicare Other

## 2014-07-23 ENCOUNTER — Emergency Department (HOSPITAL_COMMUNITY)
Admission: EM | Admit: 2014-07-23 | Discharge: 2014-07-24 | Disposition: A | Discharge status: Home / Self Care | Payer: Medicare Other | Attending: Emergency Medicine | Admitting: Emergency Medicine

## 2014-07-23 ENCOUNTER — Encounter (HOSPITAL_COMMUNITY): Payer: Self-pay | Admitting: Emergency Medicine

## 2014-07-23 DIAGNOSIS — I10 Essential (primary) hypertension: Secondary | ICD-10-CM | POA: Diagnosis not present

## 2014-07-23 DIAGNOSIS — Z7901 Long term (current) use of anticoagulants: Secondary | ICD-10-CM | POA: Insufficient documentation

## 2014-07-23 DIAGNOSIS — I482 Chronic atrial fibrillation: Secondary | ICD-10-CM | POA: Diagnosis not present

## 2014-07-23 DIAGNOSIS — Y9389 Activity, other specified: Secondary | ICD-10-CM | POA: Diagnosis not present

## 2014-07-23 DIAGNOSIS — Y9289 Other specified places as the place of occurrence of the external cause: Secondary | ICD-10-CM | POA: Diagnosis not present

## 2014-07-23 DIAGNOSIS — S4991XA Unspecified injury of right shoulder and upper arm, initial encounter: Secondary | ICD-10-CM | POA: Diagnosis present

## 2014-07-23 DIAGNOSIS — W19XXXA Unspecified fall, initial encounter: Secondary | ICD-10-CM

## 2014-07-23 DIAGNOSIS — Z9889 Other specified postprocedural states: Secondary | ICD-10-CM | POA: Diagnosis not present

## 2014-07-23 DIAGNOSIS — Z79899 Other long term (current) drug therapy: Secondary | ICD-10-CM | POA: Diagnosis not present

## 2014-07-23 DIAGNOSIS — W108XXA Fall (on) (from) other stairs and steps, initial encounter: Secondary | ICD-10-CM | POA: Insufficient documentation

## 2014-07-23 DIAGNOSIS — S43014A Anterior dislocation of right humerus, initial encounter: Secondary | ICD-10-CM | POA: Diagnosis not present

## 2014-07-23 DIAGNOSIS — S43004A Unspecified dislocation of right shoulder joint, initial encounter: Secondary | ICD-10-CM

## 2014-07-23 DIAGNOSIS — Z87448 Personal history of other diseases of urinary system: Secondary | ICD-10-CM | POA: Diagnosis not present

## 2014-07-23 MED ORDER — PROPOFOL 10 MG/ML IV BOLUS: 50.0000 mg | INTRAVENOUS | Status: DC | PRN

## 2014-07-23 MED ORDER — HYDROMORPHONE HCL 1 MG/ML IJ SOLN
1.0000 mg | Freq: Once | INTRAMUSCULAR | Status: AC
  Administered 2014-07-23: 1 mg via INTRAVENOUS

## 2014-07-23 MED ORDER — HYDROMORPHONE HCL 1 MG/ML IJ SOLN
INTRAMUSCULAR | Status: AC
  Administered 2014-07-23: 1 mg via INTRAVENOUS
  Filled 2014-07-23: qty 1

## 2014-07-23 MED ORDER — MORPHINE SULFATE 4 MG/ML IJ SOLN
4.0000 mg | Freq: Once | INTRAMUSCULAR | Status: DC
Start: 2014-07-23 — End: 2014-07-23

## 2014-07-23 MED ORDER — PROPOFOL 10 MG/ML IV BOLUS
INTRAVENOUS | Status: AC
  Filled 2014-07-23: qty 20

## 2014-07-23 MED ORDER — IPRATROPIUM-ALBUTEROL 0.5-2.5 (3) MG/3ML IN SOLN
3.0000 mL | Freq: Once | RESPIRATORY_TRACT | Status: AC
  Administered 2014-07-24: 3 mL via RESPIRATORY_TRACT
  Filled 2014-07-23: qty 3

## 2014-07-23 MED ORDER — PROPOFOL 10 MG/ML IV BOLUS
INTRAVENOUS | Status: AC | PRN
  Administered 2014-07-23 (×2): 50 mg via INTRAVENOUS

## 2014-07-23 MED ORDER — SODIUM CHLORIDE 0.9 % IV SOLN
Freq: Once | INTRAVENOUS | Status: AC
  Administered 2014-07-23: 20 mL/h via INTRAVENOUS

## 2014-07-23 NOTE — Sedation Documentation (Signed)
Pt A&Ox4, able to cough and breathe freely, denies pain, NAD, VSS, able to tolerate PO fluids. Pt thanking staff for his care.

## 2014-07-23 NOTE — ED Notes (Signed)
The pt fell down 2 steps today and landed on his rt shoulder.  This occurred approx one hour ago.  No loc no other injuries

## 2014-07-23 NOTE — ED Provider Notes (Signed)
CSN: [id number]     Arrival date & time 07/23/14  2204 History   First MD Initiated Contact with Patient 07/23/14 2225     Chief Complaint  Patient presents with  . Shoulder Injury    Patient is a 68 y.o. male presenting with shoulder injury. The history is provided by the patient.  Shoulder Injury This is a new problem. The current episode started 1 to 2 hours ago. The problem occurs constantly. The problem has not changed since onset.Pertinent negatives include no chest pain, no abdominal pain, no headaches and no shortness of breath. Exacerbated by: movement. Nothing relieves the symptoms. He has tried nothing for the symptoms.  Pt was reaching up to grab onto something while stepping down steps, he missed with his hand and fell to the ground.  He fell on his right shoulder an has not been able to move it since.  Past Medical History  Diagnosis Date  . Chronic atrial fibrillation   . Echocardiogram abnormal     2009 moderate to severely dilated left atrium  . Hypertension   . Venous stasis     chronic  . Erectile dysfunction    Past Surgical History  Procedure Laterality Date  . Pilonidal cyst excision    . Lumbar fusion  age 71  . Cardiac catheterization    . Hernia repair  age 50  . Right eyebrow tumor removed  age 67   . Myoview cardiovascular stress test  07/2003    anterior wall thinning, LV systolic function depressed at 33%  . Cardiac catheterization  03/28/2004    normal coronaries, reduced EF at 25-30% (Dr. Gerrie Nordmann)  . Cardioversion, tee guided  03/29/2004    Dr. Gerrie Nordmann   . Knee surgery    . 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 Wanda Plump. Aluisio  . Transthoracic echocardiogram  08/2008    EF=>55%, mild-mod dilated; LA mod-severely dilated; RA mild-mod dilated; mild calcif of MV, borderline MVP, trace MR; trace TR; mild pulm valve regurg    Family History  Problem Relation Age of Onset  . Hypertension Father   . Hypertension Mother   . Stroke Maternal  Grandfather   . Diabetes Paternal Grandfather   . Supraventricular tachycardia Brother   . Supraventricular tachycardia Child    History  Substance Use Topics  . Smoking status: Never Smoker   . Smokeless tobacco: Not on file  . Alcohol Use: Yes     Comment: occasionally    Review of Systems  Respiratory: Negative for shortness of breath.   Cardiovascular: Negative for chest pain.  Gastrointestinal: Negative for abdominal pain.  Neurological: Negative for headaches.  All other systems reviewed and are negative.     Allergies  Review of patient's allergies indicates no known allergies.  Home Medications   Prior to Admission medications   Medication Sig Start Date End Date Taking? Authorizing Provider  benazepril (LOTENSIN) 20 MG tablet take 1 tablet by mouth twice a day 06/21/14  Yes Leonie Man, MD  carvedilol (COREG) 12.5 MG tablet Take 1 tablet (12.5 mg total) by mouth 2 (two) times daily with a meal. 01/25/14  Yes Leonie Man, MD  colchicine 0.6 MG tablet Take 0.6 mg by mouth daily as needed (gout).    Yes Historical Provider, MD  hydrochlorothiazide (HYDRODIURIL) 25 MG tablet take 1 tablet by mouth once daily 06/21/14  Yes Leonie Man, MD  Multiple Vitamin (MULTIVITAMIN) tablet Take 1 tablet by mouth  daily.     Yes Historical Provider, MD  warfarin (COUMADIN) 6 MG tablet Take 6 mg by mouth every evening.   Yes Historical Provider, MD   BP 195/111  Pulse 96  Temp(Src) 97.9 F (36.6 C) (Oral)  Resp 18  Wt 243 lb 5 oz (110.366 kg)  SpO2 97% Physical Exam  Nursing note and vitals reviewed. Constitutional: He appears well-developed and well-nourished. No distress.  HENT:  Head: Normocephalic and atraumatic.  Right Ear: External ear normal.  Left Ear: External ear normal.  Eyes: Conjunctivae are normal. Right eye exhibits no discharge. Left eye exhibits no discharge. No scleral icterus.  Neck: Neck supple. No tracheal deviation present.  Cardiovascular:  Normal rate.   Pulmonary/Chest: Effort normal. No stridor. No respiratory distress.  Musculoskeletal: He exhibits no edema.       Right shoulder: He exhibits decreased range of motion, bony tenderness, deformity and pain. He exhibits no effusion, normal pulse and normal strength.       Right elbow: Normal. Entire spine non tender  Neurological: He is alert. Cranial nerve deficit: no gross deficits.  Skin: Skin is warm and dry. No rash noted.  Psychiatric: He has a normal mood and affect.    ED Course  Reduction of dislocation Date/Time: 07/23/2014 11:49 PM Performed by: Dorie Rank Authorized by: Dorie Rank Consent: Verbal consent obtained. Risks and benefits: risks, benefits and alternatives were discussed Consent given by: patient Patient understanding: patient states understanding of the procedure being performed Imaging studies: imaging studies available Patient identity confirmed: verbally with patient Time out: Immediately prior to procedure a "time out" was called to verify the correct patient, procedure, equipment, support staff and site/side marked as required. Local anesthesia used: no Patient sedated: yes Sedatives: propofol and see MAR for details Sedation start date/time: 07/23/2014 11:30 PM Sedation end date/time: 07/23/2014 11:49 PM Vitals: Vital signs were monitored during sedation. Patient tolerance: Patient tolerated the procedure well with no immediate complications. Comments: Supplemental o2 given  Procedural sedation Date/Time: 07/23/2014 11:50 PM Performed by: Dorie Rank Authorized by: Dorie Rank Consent: Verbal consent obtained. Risks and benefits: risks, benefits and alternatives were discussed Consent given by: patient Time out: Immediately prior to procedure a "time out" was called to verify the correct patient, procedure, equipment, support staff and site/side marked as required. Patient sedated: yes Sedatives: propofol Patient tolerance: Patient  tolerated the procedure well with no immediate complications.   (including critical care time) Labs Review Labs Reviewed - No data to display  Imaging Review Dg Shoulder Right  07/23/2014   CLINICAL DATA:  68 year old male with fall. Right shoulder pain. Initial encounter.  EXAM: RIGHT SHOULDER - 2+ VIEW  COMPARISON:  None.  FINDINGS: Anterior dislocation of the right humeral head.  Acromioclavicular joint degenerative changes.  IMPRESSION: Anterior dislocation of the right humeral head. No obvious fracture identified on this two view exam.  Acromioclavicular joint degenerative changes.   Electronically Signed   By: Chauncey Cruel M.D.   On: 07/23/2014 22:50   1200am  Pt alert and back at baseline.  Some wheezing noted on ausculation.  Will duoneb treatment and monitor.  Post reduction films ordered. 0104  Feels better after breathing treatment.  Vitals stable.  No tachypnea or hypoxia  MDM   Final diagnoses:  Shoulder dislocation, right, initial encounter    Pt successfully reduced.  He has an orthopedist that he will follow up with.  Does take anticoagulants but did not hit his head.  No loc.  No head injury noted externally    Dorie Rank, MD 07/24/14 [phone]

## 2014-07-23 NOTE — ED Provider Notes (Signed)
MSE was initiated and I personally evaluated the patient and placed orders (if any) at  11:01 PM on July 23, 2014.  The patient appears stable so that the remainder of the MSE may be completed by another provider.  Patient just returned from x-ray. Anterior shoulder dislocation is noted on plain films. Patient reports falling approximately 1-1/2 hours ago. He reports severe pain, but has not taken anything to alleviate his symptoms. He looks surprisingly well, but obvious deformity is noted about the right shoulder. Patient will be moved to an acute pod for further management.  Montine Circle, PA-C 07/23/14 2302

## 2014-07-23 NOTE — ED Notes (Signed)
Pt st's he fell down 2 steps earlier tonight and landed on right shoulder, denies any other injuries

## 2014-07-23 NOTE — ED Notes (Signed)
Dr. Tomi Bamberger, J at [facility]. Ortho tech paged.

## 2014-07-23 NOTE — ED Notes (Signed)
Pt back to room on stretcher, alert, NAD, calm, interactive, resps e/u, RN at [facility] to establish PIV.

## 2014-07-24 DIAGNOSIS — S46011A Strain of muscle(s) and tendon(s) of the rotator cuff of right shoulder, initial encounter: Secondary | ICD-10-CM

## 2014-07-24 DIAGNOSIS — S43014A Anterior dislocation of right humerus, initial encounter: Secondary | ICD-10-CM | POA: Diagnosis present

## 2014-07-24 HISTORY — DX: Strain of muscle(s) and tendon(s) of the rotator cuff of right shoulder, initial encounter: S46.011A

## 2014-07-24 HISTORY — DX: Anterior dislocation of right humerus, initial encounter: S43.014A

## 2014-07-24 MED ORDER — HYDROCODONE-ACETAMINOPHEN 5-325 MG PO TABS: 1.0000 | ORAL_TABLET | ORAL | Status: DC | PRN

## 2014-07-24 MED ORDER — HYDROCODONE-ACETAMINOPHEN 5-325 MG PO TABS
1.0000 | ORAL_TABLET | Freq: Once | ORAL | Status: AC
  Administered 2014-07-24: 1 via ORAL
  Filled 2014-07-24: qty 1

## 2014-07-24 NOTE — ED Provider Notes (Signed)
Medical screening examination/treatment/procedure(s) were performed by non-physician practitioner and as supervising physician I was immediately available for consultation/collaboration.  Orpah Greek, MD 07/24/14 [phone]

## 2014-07-24 NOTE — ED Notes (Addendum)
Pt up to b/r in a hurrry. Steady gait. No changes. Alert, NAD, calm, audible wheezes noted from BS. LS scant late expiritory wheeze noted supra clavicular on R. afib 83 on monitor. States, "wheezing is normal for me".

## 2014-07-24 NOTE — ED Notes (Signed)
Pt A&OX4,ambulatory at d/c but wheeled out of ED via wheelchair,NAD, denies pain

## 2014-07-24 NOTE — Discharge Instructions (Signed)
Shoulder Dislocation  Your shoulder is made up of three bones: the collar bone (clavicle); the shoulder blade (scapula), which includes the socket (glenoid cavity); and the upper arm bone (humerus). Your shoulder joint is the place where these bones meet. Strong, fibrous tissues hold these bones together (ligaments). Muscles and strong, fibrous tissues that connect the muscles to these bones (tendons) allow your arm to move through this joint. The range of motion of your shoulder joint is more extensive than most of your other joints, and the glenoid cavity is very shallow. That is the reason that your shoulder joint is one of the most unstable joints in your body. It is far more prone to dislocation than your other joints. Shoulder dislocation is when your humerus is forced out of your shoulder joint.  CAUSES  Shoulder dislocation is caused by a forceful impact on your shoulder. This impact usually is from an injury, such as a sports injury or a fall.  SYMPTOMS  Symptoms of shoulder dislocation include:  · Deformity of your shoulder.  · Intense pain.  · Inability to move your shoulder joint.  · Numbness, weakness, or tingling around your shoulder joint (your neck or down your arm).  · Bruising or swelling around your shoulder.  DIAGNOSIS  In order to diagnose a dislocated shoulder, your caregiver will perform a physical exam. Your caregiver also may have an X-ray exam done to see if you have any broken bones. Magnetic resonance imaging (MRI) is a procedure that sometimes is done to help your caregiver see any damage to the soft tissues around your shoulder, particularly your rotator cuff tendons. Additionally, your caregiver also may have electromyography done to measure the electrical discharges produced in your muscles if you have signs or symptoms of nerve damage.  TREATMENT  A shoulder dislocation is treated by placing the humerus back in the joint (reduction). Your caregiver does this either manually (closed  reduction), by moving your humerus back into the joint through manipulation, or through surgery (open reduction). When your humerus is back in place, severe pain should improve almost immediately.  You also may need to have surgery if you have a weak shoulder joint or ligaments, and you have recurring shoulder dislocations, despite rehabilitation. In rare cases, surgery is necessary if your nerves or blood vessels are damaged during the dislocation.  After your reduction, your arm will be placed in a shoulder immobilizer or sling to keep it from moving. Your caregiver will have you wear your shoulder immobilizer or sling for 3 days to 3 weeks, depending on how serious your dislocation is. When your shoulder immobilizer or sling is removed, your caregiver may prescribe physical therapy to help improve the range of motion in your shoulder joint.  HOME CARE INSTRUCTIONS   The following measures can help to reduce pain and speed up the healing process:  · Rest your injured joint. Do not move it. Avoid activities similar to the one that caused your injury.  · Apply ice to your injured joint for the first day or two after your reduction or as directed by your caregiver. Applying ice helps to reduce inflammation and pain.  ¨ Put ice in a plastic bag.  ¨ Place a towel between your skin and the bag.  ¨ Leave the ice on for 15-20 minutes at a time, every 2 hours while you are awake.  · Exercise your hand by squeezing a soft ball. This helps to eliminate stiffness and swelling in your hand and   wrist.  · Take over-the-counter or prescription medicine for pain or discomfort as told by your caregiver.  SEEK IMMEDIATE MEDICAL CARE IF:   · Your shoulder immobilizer or sling becomes damaged.  · Your pain becomes worse rather than better.  · You lose feeling in your arm or hand, or they become white and cold.  MAKE SURE YOU:   · Understand these instructions.  · Will watch your condition.  · Will get help right away if you are not  doing well or get worse.  Document Released: 06/19/2001 Document Revised: 02/08/2014 Document Reviewed: 07/15/2011  Exit[employer]® Patient Information ©2015 [employer]. This information is not intended to replace advice given to you by your health care provider. Make sure you discuss any questions you have with your health care provider.

## 2014-08-05 ENCOUNTER — Ambulatory Visit: Payer: Self-pay | Admitting: Orthopedic Surgery

## 2014-08-11 ENCOUNTER — Telehealth: Payer: Self-pay | Admitting: Cardiology

## 2014-08-11 NOTE — Telephone Encounter (Signed)
SPOKE TO DR  [place] HE IS SEEING THE PATIENT .  MR Cuadra  HAS  INJURIED HIS SHOULDER AND NEEDS SURGERY. DR Noemi Chapel WANTED TO KNOW IF PATIENT NEEDS TO HAVE AN CARDIOLOGY APPT FOR CLEARANCE AND WARFARIN DOSING/OR BRIDGING FOR SURGERY PATIENT HAS ANNUAL APPOINTMENT IN FEB 2016.  DR Gretchen Portela LIKE PHONE BACK BY RN AFTER DR [facility] MADE HIS DECISION- ( CELL# [id number])

## 2014-08-12 ENCOUNTER — Telehealth: Payer: Self-pay | Admitting: Cardiology

## 2014-08-12 NOTE — Telephone Encounter (Signed)
Dr Ellyn Hack spoke to dr Noemi Chapel concerning patient's upcoming surgery. Cleared for surgery, no bridging is needed. Erasmo Downer will informed patient on when to hold warfarin, per dr Ellyn Hack.

## 2014-08-12 NOTE — Telephone Encounter (Signed)
Taken care of. No need to be seen - just hold warfarin.  [name] will call him,  [facility]

## 2014-08-16 ENCOUNTER — Other Ambulatory Visit: Payer: Self-pay | Admitting: Cardiology

## 2014-08-16 DIAGNOSIS — R7303 Prediabetes: Secondary | ICD-10-CM | POA: Insufficient documentation

## 2014-08-16 DIAGNOSIS — R739 Hyperglycemia, unspecified: Secondary | ICD-10-CM | POA: Insufficient documentation

## 2014-08-16 LAB — BASIC METABOLIC PANEL
BUN: 16 mg/dL (ref 4–21)
Creatinine: 1 mg/dL (ref 0.6–1.3)
GLUCOSE: 122 mg/dL
POTASSIUM: 4.5 mmol/L (ref 3.4–5.3)
SODIUM: 140 mmol/L (ref 137–147)

## 2014-08-16 LAB — LIPID PANEL
Cholesterol: 205 mg/dL — AB (ref 0–200)
HDL: 73 mg/dL — AB (ref 35–70)
LDL CALC: 118 mg/dL
TRIGLYCERIDES: 70 mg/dL (ref 40–160)

## 2014-08-16 LAB — CBC AND DIFFERENTIAL
HEMATOCRIT: 43 % (ref 41–53)
HEMOGLOBIN: 15 g/dL (ref 13.5–17.5)
Platelets: 344 10*3/uL (ref 150–399)
WBC: 5 10^3/mL

## 2014-08-17 ENCOUNTER — Encounter (HOSPITAL_BASED_OUTPATIENT_CLINIC_OR_DEPARTMENT_OTHER): Payer: Self-pay | Admitting: Physician Assistant

## 2014-08-17 NOTE — H&P (Signed)
[name] is an 68 y.o. male.   Chief Complaint: right shoulder pain and dysfunction HPI: [name] is a 68 year old seen for acute right shoulder injury that occurred secondary to a fall on 07/23/14. He grabbed something to break his fall and sustained a right shoulder dislocation. This was reduced in the [facility] ER. Since then he's been unable to raise his arm and continues to have pain. Also of note he has significant left 3rd finger pain and swelling in the PIP joint not related to his fall. He has history of gout and is not taking medications for this but feels it may be a flare-up of this. He underwent an MRI of the right shoulder that showed an acute full thickness rotator cuff tear, as well as a Bankart tear.  He continues to have pain and is unable to raise his arm.  He sees Dr. Ellyn Hack of [facility] for atrial fibrillation and is on Coumadin and Carvedilol.  Sees Dr. [name] for primary care and is on HCTZ and Benazepril from him.  He is taking Norco for pain.   Past Medical History  Diagnosis Date  . Chronic atrial fibrillation   . Echocardiogram abnormal     2009 moderate to severely dilated left atrium  . Hypertension   . Venous stasis     chronic  . Erectile dysfunction   . Dislocation of shoulder, anterior, right, closed 07/24/2014  . Traumatic tear of right rotator cuff 07/24/2014    Past Surgical History  Procedure Laterality Date  . Pilonidal cyst excision    . Lumbar fusion  age 89  . Cardiac catheterization    . Hernia repair  age 53  . 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 Myoview cardiovascular stress test  07/2003    anterior wall thinning, LV systolic function depressed at 33%  . Cardiac catheterization  03/28/2004    normal coronaries, reduced EF at 25-30% (Dr. Gerrie Nordmann)  . Cardioversion, tee guided  03/29/2004    Dr. Gerrie Nordmann   . Knee surgery    . 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 Wanda Plump. [place]  . Transthoracic  echocardiogram  08/2008    EF=>55%, mild-mod dilated; LA mod-severely dilated; RA mild-mod dilated; mild calcif of MV, borderline MVP, trace MR; trace TR; mild pulm valve regurg     Family History  Problem Relation Age of Onset  . Hypertension Father   . Hypertension Mother   . Stroke Maternal Grandfather   . Diabetes Paternal Grandfather   . Supraventricular tachycardia Brother   . Supraventricular tachycardia Child    Social History:  reports that he has never smoked. He does not have any smokeless tobacco history on file. He reports that he drinks alcohol. His drug history is not on file.  Allergies: No Known Allergies   No current facility-administered medications for this encounter. Current outpatient prescriptions: benazepril (LOTENSIN) 20 MG tablet, take 1 tablet by mouth twice a day, Disp: 60 tablet, Rfl: 4;  carvedilol (COREG) 12.5 MG tablet, Take 1 tablet (12.5 mg total) by mouth 2 (two) times daily with a meal., Disp: 60 tablet, Rfl: 4;  hydrochlorothiazide (HYDRODIURIL) 25 MG tablet, take 1 tablet by mouth once daily, Disp: 30 tablet, Rfl: 4 HYDROcodone-acetaminophen (NORCO/VICODIN) 5-325 MG per tablet, Take 1-2 tablets by mouth every 4 (four) hours as needed., Disp: 20 tablet, Rfl: 0;  Multiple Vitamin (MULTIVITAMIN) tablet, Take 1 tablet by mouth daily.  ,  Disp: , Rfl: ;  colchicine 0.6 MG tablet, Take 0.6 mg by mouth daily as needed (gout). , Disp: , Rfl: ;  warfarin (COUMADIN) 6 MG tablet, Take 6 mg by mouth every evening., Disp: , Rfl:      Review of Systems  Constitutional: Negative.   HENT: Negative.   Eyes: Negative.   Respiratory: Negative.   Cardiovascular: Negative.   Gastrointestinal: Negative.   Genitourinary: Negative.   Musculoskeletal: Positive for joint pain.  Skin: Negative.   Neurological: Negative.   Endo/Heme/Allergies: Negative.   Psychiatric/Behavioral: Negative.     Blood pressure 120/84, pulse 84, height 6\' 3"  (1.905 m), weight 99.791 kg  (220 lb). Physical Exam  Constitutional: He is oriented to person, place, and time. He appears well-developed and well-nourished.  HENT:  Head: Normocephalic and atraumatic.  Mouth/Throat: Oropharynx is clear and moist.  Eyes: Conjunctivae and EOM are normal. Pupils are equal, round, and reactive to light.  Neck: Neck supple.  Cardiovascular: Normal rate.   Respiratory: Effort normal.  GI: Soft.  Genitourinary:  Not pertinent to current symptomatology therefore not examined.  Musculoskeletal:  Examination of his right shoulder reveals active forward flexion of 60, passive to 170 with pain and weakness.  Active abduction of 60, passive to 170 with pain and weakness.  Internal and external rotation of 50 degrees with pain and weakness and positive drop arm test.  Examination of his left shoulder reveals full range of motion without pain, weakness or instability. Vascular exam: Pulses are 2+ and symmetric.  Neurological: He is alert and oriented to person, place, and time.  Skin: Skin is warm and dry.  Psychiatric: He has a normal mood and affect.     Assessment Principal Problem:   Dislocation of shoulder, anterior, right, closed Active Problems:   HYPERTENSION, BENIGN   Chronic atrial fibrillation   Peripheral vascular disease   Long term current use of anticoagulant therapy   Traumatic tear of right rotator cuff  Plan I have talked to him about this in detail.  Would recommend with these findings that we proceed with right shoulder arthroscopy with rotator cuff repair and possible Bankart repair.  I have also talked to Dr. Ellyn Hack about him as well.  Dr. Ellyn Hack feels that he can come off of Coumadin and does not need to be bridged with Lovenox.  Dr. Ellyn Hack did not feel he needed any further preoperative cardiology evaluation.  We will also get clearance from Dr. [name].    Ruthella Kirchman J 08/17/2014, 10:01 AM

## 2014-08-17 NOTE — Telephone Encounter (Signed)
Rx was sent to pharmacy electronically. 

## 2014-08-17 NOTE — Progress Notes (Signed)
Has permission from dr harding to stop coumadin and ok for surgery with no further testing-saw pcp 08/16/14=labs done-to come in 11/11 for pt ptt Bring all meds and id

## 2014-08-18 ENCOUNTER — Telehealth: Payer: Self-pay | Admitting: *Deleted

## 2014-08-18 ENCOUNTER — Encounter (HOSPITAL_BASED_OUTPATIENT_CLINIC_OR_DEPARTMENT_OTHER)
Admission: RE | Admit: 2014-08-18 | Discharge: 2014-08-18 | Disposition: A | Discharge status: Home / Self Care | Payer: Medicare Other | Source: Ambulatory Visit | Attending: Orthopedic Surgery | Admitting: Orthopedic Surgery

## 2014-08-18 ENCOUNTER — Ambulatory Visit: Payer: Medicare Other | Admitting: Pharmacist Clinician (PhC)/ Clinical Pharmacy Specialist

## 2014-08-18 DIAGNOSIS — S46011A Strain of muscle(s) and tendon(s) of the rotator cuff of right shoulder, initial encounter: Secondary | ICD-10-CM | POA: Diagnosis not present

## 2014-08-18 DIAGNOSIS — I878 Other specified disorders of veins: Secondary | ICD-10-CM | POA: Diagnosis not present

## 2014-08-18 DIAGNOSIS — M75101 Unspecified rotator cuff tear or rupture of right shoulder, not specified as traumatic: Secondary | ICD-10-CM | POA: Diagnosis present

## 2014-08-18 DIAGNOSIS — Y9389 Activity, other specified: Secondary | ICD-10-CM | POA: Diagnosis not present

## 2014-08-18 DIAGNOSIS — S46211A Strain of muscle, fascia and tendon of other parts of biceps, right arm, initial encounter: Secondary | ICD-10-CM | POA: Diagnosis not present

## 2014-08-18 DIAGNOSIS — Z7901 Long term (current) use of anticoagulants: Secondary | ICD-10-CM | POA: Diagnosis not present

## 2014-08-18 DIAGNOSIS — Y9289 Other specified places as the place of occurrence of the external cause: Secondary | ICD-10-CM | POA: Diagnosis not present

## 2014-08-18 DIAGNOSIS — I482 Chronic atrial fibrillation: Secondary | ICD-10-CM | POA: Diagnosis not present

## 2014-08-18 DIAGNOSIS — W010XXA Fall on same level from slipping, tripping and stumbling without subsequent striking against object, initial encounter: Secondary | ICD-10-CM | POA: Diagnosis not present

## 2014-08-18 DIAGNOSIS — M7541 Impingement syndrome of right shoulder: Secondary | ICD-10-CM | POA: Diagnosis not present

## 2014-08-18 DIAGNOSIS — S43431A Superior glenoid labrum lesion of right shoulder, initial encounter: Secondary | ICD-10-CM | POA: Diagnosis not present

## 2014-08-18 DIAGNOSIS — Y998 Other external cause status: Secondary | ICD-10-CM | POA: Diagnosis not present

## 2014-08-18 DIAGNOSIS — I739 Peripheral vascular disease, unspecified: Secondary | ICD-10-CM | POA: Diagnosis not present

## 2014-08-18 DIAGNOSIS — M7501 Adhesive capsulitis of right shoulder: Secondary | ICD-10-CM | POA: Diagnosis not present

## 2014-08-18 DIAGNOSIS — N529 Male erectile dysfunction, unspecified: Secondary | ICD-10-CM | POA: Diagnosis not present

## 2014-08-18 DIAGNOSIS — I10 Essential (primary) hypertension: Secondary | ICD-10-CM | POA: Diagnosis not present

## 2014-08-18 LAB — BASIC METABOLIC PANEL
Anion gap: 12 (ref 5–15)
BUN: 14 mg/dL (ref 6–23)
CHLORIDE: 96 meq/L (ref 96–112)
CO2: 30 mEq/L (ref 19–32)
Calcium: 10.1 mg/dL (ref 8.4–10.5)
Creatinine, Ser: 0.93 mg/dL (ref 0.50–1.35)
GFR calc non Af Amer: 86 mL/min — ABNORMAL LOW (ref >90)
GLUCOSE: 119 mg/dL — AB (ref 70–99)
POTASSIUM: 5.4 meq/L — AB (ref 3.7–5.3)
Sodium: 138 mEq/L (ref 137–147)

## 2014-08-18 LAB — PROTIME-INR
INR: 1.61 — ABNORMAL HIGH (ref 0.00–1.49)
Prothrombin Time: 19.3 seconds — ABNORMAL HIGH (ref 11.6–15.2)

## 2014-08-18 LAB — APTT: aPTT: 40 seconds — ABNORMAL HIGH (ref 24–37)

## 2014-08-18 NOTE — Telephone Encounter (Signed)
Cardiac clearance faxed to [facility]

## 2014-08-18 NOTE — Progress Notes (Signed)
called and spoke with [name] in dr wainer's office.  She will give abnormal lab results to his pa and will call if any changes

## 2014-08-19 ENCOUNTER — Ambulatory Visit (HOSPITAL_BASED_OUTPATIENT_CLINIC_OR_DEPARTMENT_OTHER): Payer: Medicare Other | Admitting: Anesthesiology

## 2014-08-19 ENCOUNTER — Ambulatory Visit (HOSPITAL_BASED_OUTPATIENT_CLINIC_OR_DEPARTMENT_OTHER)
Admission: RE | Admit: 2014-08-19 | Discharge: 2014-08-19 | Disposition: A | Discharge status: Home / Self Care | Payer: Medicare Other | Source: Ambulatory Visit | Attending: Orthopedic Surgery | Admitting: Orthopedic Surgery

## 2014-08-19 ENCOUNTER — Encounter (HOSPITAL_BASED_OUTPATIENT_CLINIC_OR_DEPARTMENT_OTHER): Payer: Self-pay | Admitting: *Deleted

## 2014-08-19 ENCOUNTER — Encounter (HOSPITAL_BASED_OUTPATIENT_CLINIC_OR_DEPARTMENT_OTHER)
Admission: RE | Disposition: A | Discharge status: Home / Self Care | Payer: Self-pay | Source: Ambulatory Visit | Attending: Orthopedic Surgery

## 2014-08-19 DIAGNOSIS — M7541 Impingement syndrome of right shoulder: Secondary | ICD-10-CM | POA: Insufficient documentation

## 2014-08-19 DIAGNOSIS — S46011A Strain of muscle(s) and tendon(s) of the rotator cuff of right shoulder, initial encounter: Secondary | ICD-10-CM | POA: Diagnosis not present

## 2014-08-19 DIAGNOSIS — I10 Essential (primary) hypertension: Secondary | ICD-10-CM | POA: Insufficient documentation

## 2014-08-19 DIAGNOSIS — I878 Other specified disorders of veins: Secondary | ICD-10-CM | POA: Insufficient documentation

## 2014-08-19 DIAGNOSIS — S43014A Anterior dislocation of right humerus, initial encounter: Secondary | ICD-10-CM | POA: Diagnosis present

## 2014-08-19 DIAGNOSIS — I482 Chronic atrial fibrillation, unspecified: Secondary | ICD-10-CM | POA: Diagnosis present

## 2014-08-19 DIAGNOSIS — Z7901 Long term (current) use of anticoagulants: Secondary | ICD-10-CM | POA: Insufficient documentation

## 2014-08-19 DIAGNOSIS — W010XXA Fall on same level from slipping, tripping and stumbling without subsequent striking against object, initial encounter: Secondary | ICD-10-CM | POA: Insufficient documentation

## 2014-08-19 DIAGNOSIS — S46211A Strain of muscle, fascia and tendon of other parts of biceps, right arm, initial encounter: Secondary | ICD-10-CM | POA: Diagnosis not present

## 2014-08-19 DIAGNOSIS — I4821 Permanent atrial fibrillation: Secondary | ICD-10-CM | POA: Diagnosis present

## 2014-08-19 DIAGNOSIS — Y998 Other external cause status: Secondary | ICD-10-CM | POA: Insufficient documentation

## 2014-08-19 DIAGNOSIS — I739 Peripheral vascular disease, unspecified: Secondary | ICD-10-CM | POA: Insufficient documentation

## 2014-08-19 DIAGNOSIS — N529 Male erectile dysfunction, unspecified: Secondary | ICD-10-CM | POA: Insufficient documentation

## 2014-08-19 DIAGNOSIS — S43431A Superior glenoid labrum lesion of right shoulder, initial encounter: Secondary | ICD-10-CM | POA: Insufficient documentation

## 2014-08-19 DIAGNOSIS — Y9389 Activity, other specified: Secondary | ICD-10-CM | POA: Insufficient documentation

## 2014-08-19 DIAGNOSIS — M7501 Adhesive capsulitis of right shoulder: Secondary | ICD-10-CM | POA: Insufficient documentation

## 2014-08-19 DIAGNOSIS — Y9289 Other specified places as the place of occurrence of the external cause: Secondary | ICD-10-CM | POA: Insufficient documentation

## 2014-08-19 HISTORY — DX: Anterior dislocation of right humerus, initial encounter: S43.014A

## 2014-08-19 HISTORY — DX: Strain of muscle(s) and tendon(s) of the rotator cuff of right shoulder, initial encounter: S46.011A

## 2014-08-19 HISTORY — PX: SHOULDER ARTHROSCOPY WITH ROTATOR CUFF REPAIR AND SUBACROMIAL DECOMPRESSION: SHX5686

## 2014-08-19 LAB — POCT HEMOGLOBIN-HEMACUE: Hemoglobin: 9.8 g/dL — ABNORMAL LOW (ref 13.0–17.0)

## 2014-08-19 SURGERY — SHOULDER ARTHROSCOPY WITH ROTATOR CUFF REPAIR AND SUBACROMIAL DECOMPRESSION
Anesthesia: Regional | Site: Shoulder | Laterality: Right

## 2014-08-19 MED ORDER — ONDANSETRON HCL 4 MG/2ML IJ SOLN
INTRAMUSCULAR | Status: DC | PRN
  Administered 2014-08-19: 4 mg via INTRAVENOUS

## 2014-08-19 MED ORDER — MIDAZOLAM HCL 2 MG/2ML IJ SOLN
INTRAMUSCULAR | Status: AC
  Filled 2014-08-19: qty 2

## 2014-08-19 MED ORDER — CEFAZOLIN SODIUM-DEXTROSE 2-3 GM-% IV SOLR
INTRAVENOUS | Status: AC
  Filled 2014-08-19: qty 50

## 2014-08-19 MED ORDER — LACTATED RINGERS IV SOLN
INTRAVENOUS | Status: DC
  Administered 2014-08-19 (×2): via INTRAVENOUS

## 2014-08-19 MED ORDER — BUPIVACAINE-EPINEPHRINE (PF) 0.5% -1:200000 IJ SOLN
INTRAMUSCULAR | Status: DC | PRN
  Administered 2014-08-19: 30 mL via PERINEURAL

## 2014-08-19 MED ORDER — FENTANYL CITRATE 0.05 MG/ML IJ SOLN
50.0000 ug | INTRAMUSCULAR | Status: DC | PRN
  Administered 2014-08-19: 100 ug via INTRAVENOUS

## 2014-08-19 MED ORDER — MIDAZOLAM HCL 2 MG/2ML IJ SOLN
1.0000 mg | INTRAMUSCULAR | Status: DC | PRN
  Administered 2014-08-19: 2 mg via INTRAVENOUS

## 2014-08-19 MED ORDER — OXYCODONE HCL 5 MG PO TABS: ORAL_TABLET | ORAL | Status: DC

## 2014-08-19 MED ORDER — FENTANYL CITRATE 0.05 MG/ML IJ SOLN
INTRAMUSCULAR | Status: AC
  Filled 2014-08-19: qty 2

## 2014-08-19 MED ORDER — PROPOFOL 10 MG/ML IV BOLUS
INTRAVENOUS | Status: DC | PRN
  Administered 2014-08-19: 200 mg via INTRAVENOUS

## 2014-08-19 MED ORDER — DEXAMETHASONE SODIUM PHOSPHATE 4 MG/ML IJ SOLN
INTRAMUSCULAR | Status: DC | PRN
  Administered 2014-08-19: 10 mg via INTRAVENOUS

## 2014-08-19 MED ORDER — EPINEPHRINE HCL 1 MG/ML IJ SOLN
INTRAMUSCULAR | Status: AC
  Filled 2014-08-19: qty 1

## 2014-08-19 MED ORDER — LIDOCAINE HCL (CARDIAC) 20 MG/ML IV SOLN
INTRAVENOUS | Status: DC | PRN
  Administered 2014-08-19: 50 mg via INTRAVENOUS

## 2014-08-19 MED ORDER — DIAZEPAM 2 MG PO TABS: 2.0000 mg | ORAL_TABLET | Freq: Three times a day (TID) | ORAL | Status: DC | PRN

## 2014-08-19 MED ORDER — CHLORHEXIDINE GLUCONATE 4 % EX LIQD: 60.0000 mL | Freq: Once | CUTANEOUS | Status: DC

## 2014-08-19 MED ORDER — SODIUM CHLORIDE 0.9 % IR SOLN
Status: DC | PRN
  Administered 2014-08-19: 12000 mL

## 2014-08-19 MED ORDER — CEFAZOLIN SODIUM-DEXTROSE 2-3 GM-% IV SOLR
2.0000 g | INTRAVENOUS | Status: AC
  Administered 2014-08-19: 2 g via INTRAVENOUS

## 2014-08-19 MED ORDER — FENTANYL CITRATE 0.05 MG/ML IJ SOLN
INTRAMUSCULAR | Status: DC | PRN
  Administered 2014-08-19 (×2): 50 ug via INTRAVENOUS

## 2014-08-19 MED ORDER — EPINEPHRINE HCL 1 MG/ML IJ SOLN
INTRAMUSCULAR | Status: DC | PRN
  Administered 2014-08-19: 1 mg

## 2014-08-19 MED ORDER — SUCCINYLCHOLINE CHLORIDE 20 MG/ML IJ SOLN
INTRAMUSCULAR | Status: DC | PRN
  Administered 2014-08-19: 100 mg via INTRAVENOUS

## 2014-08-19 SURGICAL SUPPLY — 86 items
ANCH SUT SWLK 19.1X4.75 (Anchor) ×4 IMPLANT
ANCHOR SUT BIO SW 4.75X19.1 (Anchor) ×6 IMPLANT
APL SKNCLS STERI-STRIP NONHPOA (GAUZE/BANDAGES/DRESSINGS)
BENZOIN TINCTURE PRP APPL 2/3 (GAUZE/BANDAGES/DRESSINGS) IMPLANT
BLADE CLIPPER SURG (BLADE) IMPLANT
BLADE CUDA 5.5 (BLADE) IMPLANT
BLADE CUTTER GATOR 3.5 (BLADE) ×4 IMPLANT
BLADE GREAT WHITE 4.2 (BLADE) IMPLANT
BLADE GREAT WHITE 4.2MM (BLADE)
BLADE SURG 15 STRL LF DISP TIS (BLADE) IMPLANT
BLADE SURG 15 STRL SS (BLADE)
BNDG COHESIVE 4X5 TAN STRL (GAUZE/BANDAGES/DRESSINGS) ×4 IMPLANT
BUR OVAL 6.0 (BURR) IMPLANT
CANISTER SUCT 3000ML (MISCELLANEOUS) IMPLANT
CANNULA TWIST IN 8.25X7CM (CANNULA) ×6 IMPLANT
CLOSURE WOUND 1/2 X4 (GAUZE/BANDAGES/DRESSINGS)
DECANTER SPIKE VIAL GLASS SM (MISCELLANEOUS) IMPLANT
DRAPE SHOULDER BEACH CHAIR (DRAPES) ×4 IMPLANT
DRAPE U-SHAPE 47X51 STRL (DRAPES) ×8 IMPLANT
DRSG PAD ABDOMINAL 8X10 ST (GAUZE/BANDAGES/DRESSINGS) ×4 IMPLANT
DURAPREP 26ML APPLICATOR (WOUND CARE) ×4 IMPLANT
ELECT REM PT RETURN 9FT ADLT (ELECTROSURGICAL) ×4
ELECTRODE REM PT RTRN 9FT ADLT (ELECTROSURGICAL) ×2 IMPLANT
GAUZE SPONGE 4X4 12PLY STRL (GAUZE/BANDAGES/DRESSINGS) ×4 IMPLANT
GAUZE XEROFORM 1X8 LF (GAUZE/BANDAGES/DRESSINGS) ×4 IMPLANT
GLOVE BIO SURGEON STRL SZ7 (GLOVE) ×3 IMPLANT
GLOVE BIOGEL PI IND STRL 7.0 (GLOVE) ×2 IMPLANT
GLOVE BIOGEL PI IND STRL 7.5 (GLOVE) ×2 IMPLANT
GLOVE BIOGEL PI INDICATOR 7.0 (GLOVE) ×4
GLOVE BIOGEL PI INDICATOR 7.5 (GLOVE) ×2
GLOVE SS BIOGEL STRL SZ 7.5 (GLOVE) ×2 IMPLANT
GLOVE SUPERSENSE BIOGEL SZ 7.5 (GLOVE) ×2
GOWN STRL REUS W/ TWL LRG LVL3 (GOWN DISPOSABLE) ×6 IMPLANT
GOWN STRL REUS W/TWL LRG LVL3 (GOWN DISPOSABLE) ×12
KIT PUSHLOCK 2.9 HIP (KITS) IMPLANT
LASSO SUT 90 DEGREE (SUTURE) IMPLANT
LOOP 2 FIBERLINK CLOSED (SUTURE) IMPLANT
MANIFOLD NEPTUNE II (INSTRUMENTS) ×4 IMPLANT
NDL 1/2 CIR CATGUT .05X1.09 (NEEDLE) IMPLANT
NDL SAFETY ECLIPSE 18X1.5 (NEEDLE) ×2 IMPLANT
NDL SUT 6 .5 CRC .975X.05 MAYO (NEEDLE) IMPLANT
NEEDLE 1/2 CIR CATGUT .05X1.09 (NEEDLE) IMPLANT
NEEDLE HYPO 18GX1.5 SHARP (NEEDLE) ×4
NEEDLE MAYO TAPER (NEEDLE)
PACK ARTHROSCOPY DSU (CUSTOM PROCEDURE TRAY) ×4 IMPLANT
PACK BASIN DAY SURGERY FS (CUSTOM PROCEDURE TRAY) ×4 IMPLANT
PAD ALCOHOL SWAB (MISCELLANEOUS) ×2 IMPLANT
PENCIL BUTTON HOLSTER BLD 10FT (ELECTRODE) IMPLANT
SET ARTHROSCOPY TUBING (MISCELLANEOUS) ×4
SET ARTHROSCOPY TUBING LN (MISCELLANEOUS) ×2 IMPLANT
SHEET MEDIUM DRAPE 40X70 STRL (DRAPES) IMPLANT
SLEEVE SCD COMPRESS KNEE MED (MISCELLANEOUS) ×3 IMPLANT
SLING ARM IMMOBILIZER MED (SOFTGOODS) IMPLANT
SLING ARM LRG ADULT FOAM STRAP (SOFTGOODS) IMPLANT
SLING ARM MED ADULT FOAM STRAP (SOFTGOODS) IMPLANT
SLING ARM XL FOAM STRAP (SOFTGOODS) IMPLANT
SLING ULTRA III MED (ORTHOPEDIC SUPPLIES) IMPLANT
SPONGE LAP 4X18 X RAY DECT (DISPOSABLE) IMPLANT
STRIP CLOSURE SKIN 1/2X4 (GAUZE/BANDAGES/DRESSINGS) IMPLANT
SUCTION FRAZIER TIP 10 FR DISP (SUCTIONS) IMPLANT
SUT ETHILON 3 0 PS 1 (SUTURE) ×4 IMPLANT
SUT FIBERWIRE #2 38 T-5 BLUE (SUTURE) ×4
SUT LASSO 45 DEG R (SUTURE) IMPLANT
SUT LASSO 45 DEGREE LEFT (SUTURE) IMPLANT
SUT PDS AB 2-0 CT2 27 (SUTURE) IMPLANT
SUT PROLENE 3 0 PS 2 (SUTURE) IMPLANT
SUT TIGER TAPE 7 IN WHITE (SUTURE) IMPLANT
SUT VIC AB 0 SH 27 (SUTURE) IMPLANT
SUT VIC AB 2-0 PS2 27 (SUTURE) IMPLANT
SUT VIC AB 2-0 SH 27 (SUTURE)
SUT VIC AB 2-0 SH 27XBRD (SUTURE) IMPLANT
SUTURE FIBERWR #2 38 T-5 BLUE (SUTURE) ×1 IMPLANT
SYR 20CC LL (SYRINGE) IMPLANT
SYR 5ML LL (SYRINGE) ×4 IMPLANT
SYR BULB 3OZ (MISCELLANEOUS) IMPLANT
TAPE FIBER 2MM 7IN #2 BLUE (SUTURE) ×6 IMPLANT
TAPE HYPAFIX 6 X30' (GAUZE/BANDAGES/DRESSINGS)
TAPE HYPAFIX 6X30 (GAUZE/BANDAGES/DRESSINGS) IMPLANT
TAPE LABRALWHITE 1.5X36 (TAPE) IMPLANT
TAPE STRIPS DRAPE STRL (GAUZE/BANDAGES/DRESSINGS) ×4 IMPLANT
TAPE SUT LABRALTAP WHT/BLK (SUTURE) IMPLANT
TOWEL OR 17X24 6PK STRL BLUE (TOWEL DISPOSABLE) ×4 IMPLANT
TUBE CONNECTING 20'X1/4 (TUBING)
TUBE CONNECTING 20X1/4 (TUBING) IMPLANT
WAND STAR VAC 90 (SURGICAL WAND) IMPLANT
WATER STERILE IRR 1000ML POUR (IV SOLUTION) ×4 IMPLANT

## 2014-08-19 NOTE — Discharge Instructions (Signed)
°  Post Anesthesia Home Care Instructions ° °Activity: °Get plenty of rest for the remainder of the day. A responsible adult should stay with you for 24 hours following the procedure.  °For the next 24 hours, DO NOT: °-Drive a car °-Operate machinery °-Drink alcoholic beverages °-Take any medication unless instructed by your physician °-Make any legal decisions or sign important papers. ° °Meals: °Start with liquid foods such as gelatin or soup. Progress to regular foods as tolerated. Avoid greasy, spicy, heavy foods. If nausea and/or vomiting occur, drink only clear liquids until the nausea and/or vomiting subsides. Call your physician if vomiting continues. ° °Special Instructions/Symptoms: °Your throat may feel dry or sore from the anesthesia or the breathing tube placed in your throat during surgery. If this causes discomfort, gargle with warm salt water. The discomfort should disappear within 24 hours. ° ° °Post Anesthesia Home Care Instructions ° °Activity: °Get plenty of rest for the remainder of the day. A responsible adult should stay with you for 24 hours following the procedure.  °For the next 24 hours, DO NOT: °-Drive a car °-Operate machinery °-Drink alcoholic beverages °-Take any medication unless instructed by your physician °-Make any legal decisions or sign important papers. ° °Meals: °Start with liquid foods such as gelatin or soup. Progress to regular foods as tolerated. Avoid greasy, spicy, heavy foods. If nausea and/or vomiting occur, drink only clear liquids until the nausea and/or vomiting subsides. Call your physician if vomiting continues. ° °Special Instructions/Symptoms: °Your throat may feel dry or sore from the anesthesia or the breathing tube placed in your throat during surgery. If this causes discomfort, gargle with warm salt water. The discomfort should disappear within 24 hours. ° °

## 2014-08-19 NOTE — Interval H&P Note (Signed)
History and Physical Interval Note:  08/19/2014 12:13 PM  [name]  has presented today for surgery, with the diagnosis of [place] IMPINGEMENT SKYNDROME, COMPLETE RUPTURE OF TORATOR CUFF,DISLOCATION/SUBLUXATION  The various methods of treatment have been discussed with the patient and family. After consideration of risks, benefits and other options for treatment, the patient has consented to  Procedure(s): RIGHT SHOULDER ARTHROSCOPY [place] (Right) BANKHARDT REPAIR (Right) as a surgical intervention .  The patient's history has been reviewed, patient examined, no change in status, stable for surgery.  I have reviewed the patient's chart and labs.  Questions were answered to the patient's satisfaction.     Elsie Saas A

## 2014-08-19 NOTE — Anesthesia Postprocedure Evaluation (Signed)
  Anesthesia Post-op Note  Patient: [name]  Procedure(s) Performed: Procedure(s): SHOULDER ARTHROSCOPY WITH ROTATOR CUFF REPAIR AND SUBACROMIAL DECOMPRESSION (Right)  Patient Location: PACU  Anesthesia Type: General, Regional   Level of Consciousness: awake, alert  and oriented  Airway and Oxygen Therapy: Patient Spontanous Breathing  Post-op Pain: none  Post-op Assessment: Post-op Vital signs reviewed  Post-op Vital Signs: Reviewed  Last Vitals:  Filed Vitals:   08/19/14 1445  BP:   Pulse: 68  Temp:   Resp: 16    Complications: No apparent anesthesia complications

## 2014-08-19 NOTE — Progress Notes (Signed)
Assisted Dr. Hodierne with right, ultrasound guided, interscalene  block. Side rails up, monitors on throughout procedure. See vital signs in flow sheet. Tolerated Procedure well. 

## 2014-08-19 NOTE — Anesthesia Preprocedure Evaluation (Signed)
Anesthesia Evaluation  Patient identified by MRN, date of birth, ID band Patient awake    Reviewed: Allergy & Precautions, H&P , NPO status , Patient's Chart, lab work & pertinent test results  Airway Mallampati: II   Neck ROM: full    Dental   Pulmonary neg pulmonary ROS,          Cardiovascular hypertension, + Peripheral Vascular Disease + dysrhythmias Atrial Fibrillation     Neuro/Psych    GI/Hepatic   Endo/Other    Renal/GU      Musculoskeletal   Abdominal   Peds  Hematology   Anesthesia Other Findings   Reproductive/Obstetrics                             Anesthesia Physical Anesthesia Plan  ASA: II  Anesthesia Plan: General and Regional   Post-op Pain Management: MAC Combined w/ Regional for Post-op pain   Induction: Intravenous  Airway Management Planned: Oral ETT  Additional Equipment:   Intra-op Plan:   Post-operative Plan:   Informed Consent: I have reviewed the patients History and Physical, chart, labs and discussed the procedure including the risks, benefits and alternatives for the proposed anesthesia with the patient or authorized representative who has indicated his/her understanding and acceptance.     Plan Discussed with: CRNA, Anesthesiologist and Surgeon  Anesthesia Plan Comments:         Anesthesia Quick Evaluation

## 2014-08-19 NOTE — Transfer of Care (Signed)
Immediate Anesthesia Transfer of Care Note  Patient: [name]  Procedure(s) Performed: Procedure(s): SHOULDER ARTHROSCOPY WITH ROTATOR CUFF REPAIR AND SUBACROMIAL DECOMPRESSION (Right)  Patient Location: PACU  Anesthesia Type:General and GA combined with regional for post-op pain  Level of Consciousness: sedated  Airway & Oxygen Therapy: Patient Spontanous Breathing and Patient connected to face mask oxygen  Post-op Assessment: Report given to PACU RN and Post -op Vital signs reviewed and stable  Post vital signs: Reviewed and stable  Complications: No apparent anesthesia complications

## 2014-08-19 NOTE — Anesthesia Procedure Notes (Signed)
Anesthesia Regional Block:  Interscalene brachial plexus block  Pre-Anesthetic Checklist: ,, timeout performed, Correct Patient, Correct Site, Correct Laterality, Correct Procedure, Correct Position, site marked, Risks and benefits discussed,  Surgical consent,  Pre-op evaluation,  At surgeon's request and post-op pain management  Laterality: Right  Prep: chloraprep       Needles:  Injection technique: Single-shot  Needle Type: Echogenic Stimulator Needle     Needle Length: 5cm 5 cm Needle Gauge: 22 and 22 G    Additional Needles:  Procedures: ultrasound guided (picture in chart) and nerve stimulator Interscalene brachial plexus block  Nerve Stimulator or Paresthesia:  Response: biceps flexion, 0.45 mA,   Additional Responses:   Narrative:  Start time: 08/19/2014 12:04 PM End time: 08/19/2014 12:15 PM Injection made incrementally with aspirations every 5 mL.  Performed by: Personally   Additional Notes: Functioning IV was confirmed and monitors were applied.  A 94mm 22ga Arrow echogenic stimulator needle was used. Sterile prep and drape,hand hygiene and sterile gloves were used.  Negative aspiration and negative test dose prior to incremental administration of local anesthetic. The patient tolerated the procedure well.  Ultrasound guidance: relevent anatomy identified, needle position confirmed, local anesthetic spread visualized around nerve(s), vascular puncture avoided.  Image printed for medical record.

## 2014-08-20 NOTE — Op Note (Signed)
NAMERACIEL, RIERSON               ACCOUNT NO.:  [id number]  MEDICAL RECORD NO.:  [id number]  LOCATION:                                 FACILITY:  PHYSICIAN:  Elana Alm. Thurston Hole, M.D. DATE OF BIRTH:  05-08-1946  DATE OF PROCEDURE:  08/19/2014 DATE OF DISCHARGE:  08/19/2014                              OPERATIVE REPORT   PREOPERATIVE DIAGNOSES: 1. Right shoulder acute traumatic rotator cuff tear. 2. Right shoulder acute traumatic biceps tendon rupture. 3. Right shoulder acute traumatic partial labrum tear. 4. Right shoulder chronic nontraumatic impingement.  POSTOPERATIVE DIAGNOSES: 1. Right shoulder acute traumatic rotator cuff tear. 2. Right shoulder acute traumatic biceps tendon rupture. 3. Right shoulder acute traumatic partial labrum tear. 4. Right shoulder chronic nontraumatic impingement.  PROCEDURES: 1. Right shoulder examination under anesthesia followed by     arthroscopically-assisted rotator cuff repair using Arthrex     SwiveLock anchors x2 and fiber tape x2. 2. Right shoulder partial labrum tear debridement. 3. Right shoulder subacromial decompression.  SURGEON:  Elana Alm. Thurston Hole, M.D.  ASSISTANT:  Julien Girt, PA-C  ANESTHESIA:  General.  OPERATIVE TIME:  1 hour.  COMPLICATIONS:  None.  INDICATION FOR PROCEDURE:  Mr. Mchaffie is a 68 year old gentleman who sustained a traumatic dislocation of his right shoulder approximately a month ago with exam and MRI documenting a complete rotator cuff tear as well as a partial Bankart tear, probable biceps tendon rupture as well. He is now to undergo arthroscopy and repair due to significant disability.  DESCRIPTION OF PROCEDURE:  Mr. Fitzer was brought into the operating room on August 19, 2014, after an interscalene block was placed in holding room by Anesthesia.  He was placed on operative table in supine position.  He received antibiotics preoperatively for prophylaxis. After being placed under general  anesthesia, his right shoulder was examined.  Initial range of motion showed forward flexion of 140, abduction of 150, internal and external rotation of 50 degrees.  A gentle manipulation was carried out breaking up adhesions and improving the range of motion and near full range of motion.  He was not unstable after this was done.  At this point, he was placed in beach-chair position, and his shoulder and arm were prepped using sterile DuraPrep and draped using sterile technique.  Time-out procedure was called and the correct right shoulder identified.  Initially, through a posterior arthroscopic portal, the arthroscope with a pump attached was placed into an anterior portal.  An arthroscopic probe was placed.  On initial inspection of the articular cartilage, the glenohumeral joint was intact.  He had partial tearing of the anterior labrum superiorly, but not inferiorly and there was no significant Bankart tear noted and no positive drivethrough sign and thus, I did not feel that any anterior, inferior Bankart stabilization would be indicated.  His posterior labrum was intact.  His inferior labrum was intact.  The biceps tendon was completely ruptured and not evident in the joint at all.  The superior labrum showed partial tearing, which was debrided.  The rotator cuff showed a complete tear of the supraspinatus and the anterior 50% of the infraspinatus.  The subscapularis and teres minor were intact.  Inferior capsular recess free of pathology.  Subacromial space was entered and a lateral arthroscopic portal was made.  Moderately thickened bursitis was resected.  Impingement was noted and a subacromial decompression was carried out as well as the CA ligament release.  The [facility] joint was not pathologic and thus was not resected.  At this point, then the rotator cuff tear was repaired primarily with two separate Arthrex SwiveLock anchors and fiber tape.  Each of these fiber tapes were placed  in a mattress suture technique and then each of the SwiveLocks were placed one in the anterolateral and one in the posterolateral aspect of the greater tuberosity, both secured and the rotator cuff tear back down with firm and tight fixation.  After this was done, the shoulder could be brought through a full range of motion with no impingement on the repair.  At this point, it was felt that all pathology had been satisfactorily addressed.  The instruments were removed.  Portals were closed with 3-0 nylon suture.  Sterile dressings and a sling applied. The patient awakened and taken to the recovery room in stable condition.  FOLLOWUP CARE:  Mr. Amacker will be followed as an outpatient on OxyIR and valium and an abduction sling.  Seen back in the office in a week for sutures out and followup.     Antrone Walla A. Thurston Hole, M.D.     RAW/MEDQ  D:  08/19/2014  T:  08/19/2014  Job:  [id number]

## 2014-08-24 ENCOUNTER — Encounter (HOSPITAL_BASED_OUTPATIENT_CLINIC_OR_DEPARTMENT_OTHER): Payer: Self-pay | Admitting: Orthopedic Surgery

## 2014-09-15 ENCOUNTER — Telehealth: Payer: Self-pay | Admitting: *Deleted

## 2014-09-15 NOTE — Telephone Encounter (Signed)
Surgical clearance faxed to [facility]

## 2014-09-20 LAB — HEMOGLOBIN A1C: HEMOGLOBIN A1C: 5.6 % (ref 4.0–6.0)

## 2014-10-15 ENCOUNTER — Ambulatory Visit (INDEPENDENT_AMBULATORY_CARE_PROVIDER_SITE_OTHER): Payer: Medicare Other | Admitting: Pharmacist Clinician (PhC)/ Clinical Pharmacy Specialist

## 2014-10-15 DIAGNOSIS — I482 Chronic atrial fibrillation, unspecified: Secondary | ICD-10-CM

## 2014-10-15 DIAGNOSIS — Z7901 Long term (current) use of anticoagulants: Secondary | ICD-10-CM

## 2014-10-15 LAB — POCT INR: INR: 2.8

## 2014-11-17 NOTE — Op Note (Signed)
NAMEJVION, TURGEON NO.:  [id number]  MEDICAL RECORD NO.:  [id number]  LOCATION:  B19C                         FACILITY:  [place]  PHYSICIAN:  Audree Camel. Noemi Chapel, M.D. DATE OF BIRTH:  [birth year]  DATE OF PROCEDURE:  08/19/2014 DATE OF DISCHARGE:  07/24/2014                              OPERATIVE REPORT   ADDENDUM:  Subacromial decompression for impingement at the time of Mr. Behar surgery.  On entering the subacromial space, I noted that there was significant impingement with significant thickening, evidence of rubbing and impingement on the rotator cuff, and significant bursitis as well. Because of this, I elected to proceed with a subacromial decompression removing 6-8 mm of the underside of the anterior, anterolateral, anteromedial acromion, and CA ligament release as well.  This completely relieved the impingement noted and thus there was no further rubbing or pressure on the rotator cuff.  Through range of motion and there was excellent excursion of the repair of the rotator cuff tear after this subacromial decompression had been carried out.  This was absolutely medically necessary in order to decrease any chance of retearing of the rotator cuff repair due to the significant impingement.     Tennille Montelongo A. Noemi Chapel, M.D.     RAW/MEDQ  D:  11/17/2014  T:  11/17/2014  Job:  [id number]

## 2014-11-20 ENCOUNTER — Other Ambulatory Visit: Payer: Self-pay | Admitting: Cardiology

## 2014-11-22 NOTE — Telephone Encounter (Signed)
Rx(s) sent to pharmacy electronically. OV 12/27/14

## 2014-11-26 ENCOUNTER — Ambulatory Visit (INDEPENDENT_AMBULATORY_CARE_PROVIDER_SITE_OTHER): Payer: 59 | Admitting: Pharmacist Clinician (PhC)/ Clinical Pharmacy Specialist

## 2014-11-26 DIAGNOSIS — Z7901 Long term (current) use of anticoagulants: Secondary | ICD-10-CM

## 2014-11-26 DIAGNOSIS — I482 Chronic atrial fibrillation, unspecified: Secondary | ICD-10-CM

## 2014-11-26 LAB — POCT INR: INR: 3

## 2014-12-19 ENCOUNTER — Other Ambulatory Visit: Payer: Self-pay | Admitting: Cardiology

## 2014-12-20 NOTE — Telephone Encounter (Signed)
Rx has been sent to the pharmacy electronically. ° °

## 2014-12-22 ENCOUNTER — Other Ambulatory Visit: Payer: Self-pay

## 2014-12-23 MED ORDER — WARFARIN SODIUM 6 MG PO TABS: ORAL_TABLET | ORAL | Status: DC

## 2014-12-26 ENCOUNTER — Other Ambulatory Visit: Payer: Self-pay | Admitting: Cardiology

## 2014-12-27 ENCOUNTER — Ambulatory Visit (INDEPENDENT_AMBULATORY_CARE_PROVIDER_SITE_OTHER): Payer: 59 | Admitting: Cardiology

## 2014-12-27 ENCOUNTER — Encounter: Payer: Self-pay | Admitting: Cardiology

## 2014-12-27 ENCOUNTER — Ambulatory Visit (INDEPENDENT_AMBULATORY_CARE_PROVIDER_SITE_OTHER): Payer: 59 | Admitting: Pharmacist Clinician (PhC)/ Clinical Pharmacy Specialist

## 2014-12-27 VITALS — BP 160/90 | HR 75 | Ht 74.5 in | Wt 230.0 lb

## 2014-12-27 DIAGNOSIS — I739 Peripheral vascular disease, unspecified: Secondary | ICD-10-CM

## 2014-12-27 DIAGNOSIS — I872 Venous insufficiency (chronic) (peripheral): Secondary | ICD-10-CM

## 2014-12-27 DIAGNOSIS — I10 Essential (primary) hypertension: Secondary | ICD-10-CM | POA: Diagnosis not present

## 2014-12-27 DIAGNOSIS — I8310 Varicose veins of unspecified lower extremity with inflammation: Secondary | ICD-10-CM | POA: Diagnosis not present

## 2014-12-27 DIAGNOSIS — I482 Chronic atrial fibrillation, unspecified: Secondary | ICD-10-CM

## 2014-12-27 DIAGNOSIS — Z7901 Long term (current) use of anticoagulants: Secondary | ICD-10-CM

## 2014-12-27 LAB — POCT INR: INR: 1.8

## 2014-12-27 MED ORDER — CARVEDILOL 12.5 MG PO TABS: 12.5000 mg | ORAL_TABLET | Freq: Two times a day (BID) | ORAL | Status: DC

## 2014-12-27 NOTE — Patient Instructions (Signed)
Your physician has made no changes today in your current medications or treatment plan.  Dr Ellyn Hack recommends that you schedule a follow-up appointment in 1 year. You will receive a reminder letter in the mail two months in advance. If you don't receive a letter, please call our office to schedule the follow-up appointment.

## 2014-12-30 NOTE — Assessment & Plan Note (Signed)
Asymptomatic. Rate controlledd - BB.  Failed non-invasive Rhythm control On Warfarin for anticoagulation - INR ranging from ~1.8 t0 3 on latest recordings.

## 2014-12-30 NOTE — Progress Notes (Signed)
PCP: [name], MD  Clinic Note: Chief Complaint  Patient presents with  . 44 MONTH VISIT    NO CHEST DISCOMFORT,  SOB WHEN WALKING UP STEPS, NO EDEMEA- PCP FOLLOWS LIPIDS    HPI: [name] is a 69 y.o. male with a PMH below who presents today for ~ annual f/u for Chronic/Permanent Atrial Fibrillation.  Marland Kitchen He was initially diagnosed in June 2005 when he presented with rapid A. fib and nonischemic cardiomyopathy. Cardiac catheterization which showed no evidence of coronary disease. At that time his EF was estimated at roughly 30% with global hypokinesis. He underwent TEE guided cardioversion, and did quite well for a while until he had a cold and took some decongestant medicine that caused him to to go back in A. fib again. Since then as far as I can tell he has been in chronic stable atrial fibrillation. He has an irregularly warfarin, and having no difficulties. His repeat echocardiogram in November 2009 showed an improved EF of greater than 55% with moderately dilated right atrium and ventricle as well as severely dilated left atrium. He has been rate controlled since. His only major symptoms he is noted is that he gets dyspneic climbing stairs and bending over in the morning tying shoes. Otherwise he is very active and exercises almost 6 days a week. He says out of the 365 days last year he worked out 348 days. Had Surgery R shoulder Rotator cuff - traumatic tear (08/2014)  Past Medical History  Diagnosis Date  . Chronic atrial fibrillation   . Echocardiogram abnormal     2009 moderate to severely dilated left atrium  . Hypertension   . Venous stasis     chronic  . Erectile dysfunction   . Dislocation of shoulder, anterior, right, closed 07/24/2014  . Traumatic tear of right rotator cuff 07/24/2014  . Dysrhythmia     af    Prior Cardiac Evaluation and Past Surgical History: Past Surgical History  Procedure Laterality Date  . Pilonidal cyst excision    . Lumbar fusion   age 19  . Cardiac catheterization    . Hernia repair  age 88  . Right eyebrow tumor removed  age 40   . Myoview cardiovascular stress test  07/2003    anterior wall thinning, LV systolic function depressed at 33%  . Cardiac catheterization  03/28/2004    normal coronaries, reduced EF at 25-30% (Dr. Gerrie Nordmann)  . Cardioversion, tee guided  03/29/2004    Dr. Gerrie Nordmann   . Knee surgery    . 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 Wanda Plump. [place]  . Transthoracic echocardiogram  08/2008    EF=>55%, mild-mod dilated; LA mod-severely dilated; RA mild-mod dilated; mild calcif of MV, borderline MVP, trace MR; trace TR; mild pulm valve regurg   . Shoulder arthroscopy with rotator cuff repair and subacromial decompression Right 08/19/2014    Procedure: SHOULDER ARTHROSCOPY WITH ROTATOR CUFF REPAIR AND SUBACROMIAL DECOMPRESSION;  Surgeon: Lorn Junes, MD;  Location: [facility];  Service: Orthopedics;  Laterality: Right;   Interval History: He essentially notes the same exertional dyspnea with stairs (or carrying something heavy) -- less of a problem using R arm.  Also notes dyspnea bending over to tie his shoes. Still denies any sensation of Afib - no palpations or  lightheadedness, dizziness, weakness or syncope/near syncope. NO CHF symptoms of PND, orthopnea or edema.  No chest pain or shortness of breath with rest or  exertion. No TIA/amaurosis fugax symptoms. No melena, hematochezia, hematuria, or epstaxis. No claudication.  ROS: A comprehensive was performed. Review of Systems  Respiratory: Positive for shortness of breath (chronic exertional dyspnea).   Cardiovascular: Positive for leg swelling. Negative for palpitations and orthopnea.  Genitourinary:       Mild erectile dysfunction - much improved s/p Penile implant   Musculoskeletal: Positive for joint pain (shoulder still sore).  Neurological: Negative.  Negative for dizziness.  Psychiatric/Behavioral: Negative  for depression.  All other systems reviewed and are negative.   Current Outpatient Prescriptions on File Prior to Visit  Medication Sig Dispense Refill  . benazepril (LOTENSIN) 20 MG tablet take 1 tablet by mouth twice a day 60 tablet 0  . hydrochlorothiazide (HYDRODIURIL) 25 MG tablet take 1 tablet by mouth once daily 30 tablet 0  . Multiple Vitamin (MULTIVITAMIN) tablet Take 1 tablet by mouth daily.      Marland Kitchen warfarin (COUMADIN) 6 MG tablet Take 1 tablet by mouth daily or as directed by coumadin clinic 90 tablet 1   No current facility-administered medications on file prior to visit.   No Known Allergies  History  Substance Use Topics  . Smoking status: Never Smoker   . Smokeless tobacco: Not on file  . Alcohol Use: Yes     Comment: occasionally   Family History  Problem Relation Age of Onset  . Hypertension Father   . Hypertension Mother   . Stroke Maternal Grandfather   . Diabetes Paternal Grandfather   . Supraventricular tachycardia Brother   . Supraventricular tachycardia Child     Wt Readings from Last 3 Encounters:  12/27/14 230 lb (104.327 kg)  08/19/14 224 lb (101.606 kg)  07/23/14 243 lb 5 oz (110.366 kg)    PHYSICAL EXAM BP 160/90 mmHg  Pulse 75  Ht 6' 2.5" (1.892 m)  Wt 230 lb (104.327 kg)  BMI 29.14 kg/m2 General:, he is a very pleasant, healthy-appearing gentleman; well-groomed, well-nourished, in no acute distress. He is deeply tanned, A&O x3, answers questions appropriately. Pleasant mood & affect HEENT: NCAT. EOMI. MMM. Anicteric sclerae.  Neck: Supple. No LAN, JVD or carotid bruit.  Lungs: CTAB. Nonlabored. Normal effort. Good air movement.  Heart: Irregularly irregular. Normal S1, S2. No M/R/G. Nondisplaced PMI. I do not hear a right-sided carotid bruit that Dr.Little heard.  Abdomen is soft, NT, ND. NABS. No HSM.  Extremities: No C/C, but trace edema on the left side.   Adult ECG Report  Rate: 75 ;  Rhythm: atrial fibrillation and ~ IVCD,  non-specific ST-T wave abnormalitiy.; ~ borderline L axis Deviation- -26  Narrative Interpretation: stable ECG  Recent Labs:  n/a   ASSESSMENT / PLAN: Problem List Items Addressed This Visit    Chronic atrial fibrillation - Primary (Chronic)    Asymptomatic. Rate controlledd - BB.  Failed non-invasive Rhythm control On Warfarin for anticoagulation - INR ranging from ~1.8 t0 3 on latest recordings.      Relevant Medications   carvedilol (COREG) tablet   Other Relevant Orders   EKG 12-Lead (Completed)   Essential hypertension (Chronic)    BP above upper limit of normal today -On BB, ACE-I & HCTZ - reassess BP in future visits, May need additional treatment.  Still may have room on Carvedilol.   May have room to titrate up BB.      Relevant Medications   carvedilol (COREG) tablet   Long term current use of anticoagulant therapy    On warfarin - follows  up @ [facility].       Peripheral vascular disease   Relevant Medications   carvedilol (COREG) tablet   Venous stasis dermatitis (Chronic)    Stable, minimal edema --> Only on HCTZas  diuretic. If worsens - consider compressions stockings . Would be OK for intermittent           Orders Placed This Encounter  Procedures  . EKG 12-Lead   Meds ordered this encounter  Medications  . carvedilol (COREG) 12.5 MG tablet    Sig: Take 1 tablet (12.5 mg total) by mouth 2 (two) times daily with a meal.    Dispense:  60 tablet    Refill:  11     Followup: 1 yr    [name], Leonie Green, M.D., M.S. Interventional Cardiologist   Pager # [phone]

## 2014-12-30 NOTE — Assessment & Plan Note (Signed)
On warfarin - follows up @ [facility].

## 2014-12-30 NOTE — Assessment & Plan Note (Signed)
BP above upper limit of normal today -On BB, ACE-I & HCTZ - reassess BP in future visits, May need additional treatment.  Still may have room on Carvedilol.   May have room to titrate up BB.

## 2014-12-30 NOTE — Assessment & Plan Note (Addendum)
Stable, minimal edema --> Only on HCTZas  diuretic. If worsens - consider compressions stockings . Would be OK for intermittent

## 2015-01-16 ENCOUNTER — Other Ambulatory Visit: Payer: Self-pay | Admitting: Cardiology

## 2015-01-24 ENCOUNTER — Ambulatory Visit (INDEPENDENT_AMBULATORY_CARE_PROVIDER_SITE_OTHER): Payer: 59 | Admitting: Pharmacist Clinician (PhC)/ Clinical Pharmacy Specialist

## 2015-01-24 DIAGNOSIS — Z7901 Long term (current) use of anticoagulants: Secondary | ICD-10-CM | POA: Diagnosis not present

## 2015-01-24 DIAGNOSIS — I482 Chronic atrial fibrillation, unspecified: Secondary | ICD-10-CM

## 2015-01-24 LAB — POCT INR: INR: 2.5

## 2015-01-29 NOTE — Op Note (Signed)
PATIENT NAME:  [name], [name] MR#:  [id number] DATE OF BIRTH:  1946-05-17  DATE OF PROCEDURE:  12/21/2013  PREOPERATIVE DIAGNOSIS:  Vascular impotence.   POSTOPERATIVE DIAGNOSIS:  Vascular impotence.   PROCEDURE:  Implantation of a multi-component penile prosthesis. The elements are 15 LGX with 65 mL within the reservoir and a 4.0 cm rear tip extender.    DESCRIPTION OF PROCEDURE:  With the patient sterilely prepped and draped in the supine position, after an appropriate timeout and under good relaxation from a general anesthetic, I will make a small penile scrotal incision about 3 inches long and carry it through the to the corpora on either side.  A sterilely placed Foley catheter allows me to make sure that the urethra stays away from any kind of injury. On the right and left side I go laterally to the urethra, making incision in each corpora after dissecting down to the corpora, place 0 PDS suture on each edge of the corporal incision to hold it open. Then we use copious irrigation throughout with antibiotics to maintain sterility. After I open each corpora, I take Metzenbaum scissors and go laterally down to the ischial tuberosity on each side, gently dissecting the tissue by spreading the dull-ended Metzenbaum scissors. Then I dilate with Hegar dilators up to 13 mm sequentially, starting at 9 mm.  I dilate proximally and distally to the glans and to the ischial tuberosity. He has more proximal and distal length.  I measure with a Furlow measuring device and the length is 19 cm.  So I use a 15 mL with a 4 cm rear-tip extender 700 LGX. Then I place the prosthesis in in the following manner.  I take a Furlow inserting device with Lanny Hurst needle with 2 sutures threaded through it.  I place it in the inserter, place the inserter to the glans, push the needle through the tip of the glans, grasp the needle with a Kelly forceps and pull the suture from the proximal end of the prosthesis and pull the proximal  end of the prosthesis into the corpora that extends into the glands then I put the proximal end of the prosthesis to the ischial tuberosities. This is done on both sides.  Close the corpora with both the preplaced suture and 1 other suture. I cut away the connection tubing covering from the cylinders. I preplaced the 65 mL reservoir just beneath the pubes medially going through the external ring and going behind the pubic ramus and making a pocket there. I checked the 65 mL reservoir and it does not have any back pressure on at 65 mL. The pump is placed in the right lateral portion of the scrotum, carefully making a pocket there.  I connected the pump with straight connectors to the reservoir. The cylinders are pre-connected, so I do not have to connect the cylinders.  I connect the reservoir to the pump and then I test the connection.  The single straight connection is perfect. Then I pump the prosthesis up again on both sides with the pump. There is no leakage. There is a good prosthesis affect. The penis is straight and is adequately hard. The prostheses do not bulge out through the incision. So then I close the pocket for the pump utilizing running 3-0 Vicryl. Then I close the rest of the tunica vaginalis over the tubing. Then the skin is closed with a running 3-0 Vicryl subcuticularly. The patient has dressings placed in the form of Dermabond. Then I rechecked  the prosthesis by putting 1 pump in it to leave it just pumped up by 1 pump.  He is sent to recovery in satisfactory condition with sterile dressings and fluff placed with a scrotal supporter. Ice will be placed on the patient's scrotum postoperatively for control of any oozing. There was very little bleeding during the case, although there was some, and whatever blood did clot rather quickly.     ____________________________ Janice Coffin. Elnoria Howard, [place] rdh:mk D: 12/21/2013 12:06:40 ET T: 12/21/2013 20:19:06 ET JOB#: [id number]  cc: Janice Coffin. Elnoria Howard, DO,  <Dictator> RICHARD D HART DO ELECTRONICALLY SIGNED 12/24/2013 17:33

## 2015-01-31 ENCOUNTER — Other Ambulatory Visit: Payer: Self-pay | Admitting: Cardiology

## 2015-01-31 NOTE — Telephone Encounter (Signed)
Rx has been sent to the pharmacy electronically. ° °

## 2015-02-21 ENCOUNTER — Ambulatory Visit: Payer: 59 | Admitting: Pharmacist Clinician (PhC)/ Clinical Pharmacy Specialist

## 2015-03-24 DIAGNOSIS — C44712 Basal cell carcinoma of skin of right lower limb, including hip: Secondary | ICD-10-CM | POA: Insufficient documentation

## 2015-03-24 HISTORY — DX: Basal cell carcinoma of skin of right lower limb, including hip: C44.712

## 2015-05-12 ENCOUNTER — Telehealth: Payer: Self-pay | Admitting: Pharmacist Clinician (PhC)/ Clinical Pharmacy Specialist

## 2015-05-13 NOTE — Telephone Encounter (Signed)
Close encounter 

## 2015-05-19 ENCOUNTER — Ambulatory Visit (INDEPENDENT_AMBULATORY_CARE_PROVIDER_SITE_OTHER): Payer: Medicare Other | Admitting: Pharmacist Clinician (PhC)/ Clinical Pharmacy Specialist

## 2015-05-19 DIAGNOSIS — Z7901 Long term (current) use of anticoagulants: Secondary | ICD-10-CM

## 2015-05-19 DIAGNOSIS — I482 Chronic atrial fibrillation, unspecified: Secondary | ICD-10-CM

## 2015-05-19 LAB — POCT INR: INR: 2.3

## 2015-05-30 ENCOUNTER — Encounter (HOSPITAL_COMMUNITY): Payer: Self-pay | Admitting: Emergency Medicine

## 2015-05-30 ENCOUNTER — Emergency Department (HOSPITAL_COMMUNITY)
Admission: EM | Admit: 2015-05-30 | Discharge: 2015-05-30 | Disposition: A | Discharge status: Home / Self Care | Payer: Medicare Other | Attending: Emergency Medicine | Admitting: Emergency Medicine

## 2015-05-30 ENCOUNTER — Emergency Department (HOSPITAL_COMMUNITY): Payer: Medicare Other

## 2015-05-30 DIAGNOSIS — S0990XA Unspecified injury of head, initial encounter: Secondary | ICD-10-CM | POA: Diagnosis present

## 2015-05-30 DIAGNOSIS — Y998 Other external cause status: Secondary | ICD-10-CM | POA: Insufficient documentation

## 2015-05-30 DIAGNOSIS — S0081XA Abrasion of other part of head, initial encounter: Secondary | ICD-10-CM | POA: Diagnosis not present

## 2015-05-30 DIAGNOSIS — Y9289 Other specified places as the place of occurrence of the external cause: Secondary | ICD-10-CM | POA: Diagnosis not present

## 2015-05-30 DIAGNOSIS — F1010 Alcohol abuse, uncomplicated: Secondary | ICD-10-CM | POA: Diagnosis not present

## 2015-05-30 DIAGNOSIS — R791 Abnormal coagulation profile: Secondary | ICD-10-CM

## 2015-05-30 DIAGNOSIS — Y9389 Activity, other specified: Secondary | ICD-10-CM | POA: Diagnosis not present

## 2015-05-30 DIAGNOSIS — Z79899 Other long term (current) drug therapy: Secondary | ICD-10-CM | POA: Diagnosis not present

## 2015-05-30 DIAGNOSIS — F1020 Alcohol dependence, uncomplicated: Secondary | ICD-10-CM

## 2015-05-30 DIAGNOSIS — I10 Essential (primary) hypertension: Secondary | ICD-10-CM | POA: Diagnosis not present

## 2015-05-30 DIAGNOSIS — W1839XA Other fall on same level, initial encounter: Secondary | ICD-10-CM | POA: Insufficient documentation

## 2015-05-30 DIAGNOSIS — S0091XA Abrasion of unspecified part of head, initial encounter: Secondary | ICD-10-CM

## 2015-05-30 LAB — CBC WITH DIFFERENTIAL/PLATELET
BASOS PCT: 0 % (ref 0–1)
Basophils Absolute: 0 10*3/uL (ref 0.0–0.1)
EOS ABS: 0.2 10*3/uL (ref 0.0–0.7)
Eosinophils Relative: 4 % (ref 0–5)
HCT: 40.1 % (ref 39.0–52.0)
HEMOGLOBIN: 14.1 g/dL (ref 13.0–17.0)
Lymphocytes Relative: 29 % (ref 12–46)
Lymphs Abs: 1.6 10*3/uL (ref 0.7–4.0)
MCH: 33 pg (ref 26.0–34.0)
MCHC: 35.2 g/dL (ref 30.0–36.0)
MCV: 93.9 fL (ref 78.0–100.0)
Monocytes Absolute: 0.6 10*3/uL (ref 0.1–1.0)
Monocytes Relative: 11 % (ref 3–12)
NEUTROS PCT: 56 % (ref 43–77)
Neutro Abs: 3.1 10*3/uL (ref 1.7–7.7)
Platelets: 209 10*3/uL (ref 150–400)
RBC: 4.27 MIL/uL (ref 4.22–5.81)
RDW: 13.2 % (ref 11.5–15.5)
WBC: 5.5 10*3/uL (ref 4.0–10.5)

## 2015-05-30 LAB — BASIC METABOLIC PANEL
Anion gap: 12 (ref 5–15)
BUN: 11 mg/dL (ref 6–20)
CALCIUM: 9.2 mg/dL (ref 8.9–10.3)
CO2: 27 mmol/L (ref 22–32)
CREATININE: 0.92 mg/dL (ref 0.61–1.24)
Chloride: 88 mmol/L — ABNORMAL LOW (ref 101–111)
Glucose, Bld: 108 mg/dL — ABNORMAL HIGH (ref 65–99)
Potassium: 3.8 mmol/L (ref 3.5–5.1)
SODIUM: 127 mmol/L — AB (ref 135–145)

## 2015-05-30 LAB — APTT: aPTT: 45 seconds — ABNORMAL HIGH (ref 24–37)

## 2015-05-30 LAB — PROTIME-INR
INR: 1.83 — AB (ref 0.00–1.49)
PROTHROMBIN TIME: 21.1 s — AB (ref 11.6–15.2)

## 2015-05-30 NOTE — Discharge Instructions (Signed)
The CT scan of your head and c-spine are negative for bleed today.  It is recommended that you cut back on drinking alcohol.  It is unsafe to drink large amount of alcohol while on coumadin.  Coumadin increases the chance of having a head bleed if you are to fall.  It is recommended that that you talk to your Primary Care Physician and Cardiologist regarding this.

## 2015-05-30 NOTE — ED Notes (Signed)
Patient was getting up from the bar and fell  over his feet and feel backwards. Patient is on Blood thinners (warfin). Patient has a Hematoma to the back of the head. Patient states he had 10 beers. Patient Alert & oriented x4 on arrival able to move all extremities.

## 2015-05-30 NOTE — ED Notes (Signed)
Pt stable, ambulatory, states understanding of discharge instructions 

## 2015-05-30 NOTE — ED Provider Notes (Signed)
Medical screening examination/treatment/procedure(s) were conducted as a shared visit with non-physician practitioner(s) and myself.  I personally evaluated the patient during the encounter.   EKG Interpretation   Date/Time:  Monday May 30 2015 21:40:17 EDT Ventricular Rate:  69 PR Interval:    QRS Duration: 133 QT Interval:  451 QTC Calculation: 483 R Axis:   52 Text Interpretation:  Atrial fibrillation Nonspecific intraventricular  conduction delay No significant change since last tracing Confirmed by  Kace Hartje  MD, Elvena Oyer [phone]) on 05/30/2015 9:44:10 PM      Patient seen by me. Patient with a history of drinking on a regular basis. Patient was at a bar and fell fell backwards and struck the back of his head. No apparent loss of consciousness. Patient without any focal neuro deficits. Back of the head and scalp area has an abrasion no laceration. CT of the head is negative CT of the C-spine negative. Patient is on blood thinners however his INR is subtherapeutic. Patient is stable for discharge home.  Fredia Sorrow, MD 05/30/15 [phone]

## 2015-05-30 NOTE — ED Provider Notes (Signed)
CSN: [id number]     Arrival date & time 05/30/15  2115 History   First MD Initiated Contact with Patient 05/30/15 2131     Chief Complaint  Patient presents with  . Fall     (Consider location/radiation/quality/duration/timing/severity/associated sxs/prior Treatment) HPI Comments: Patient with a history of Atrial Fibrillation currently on Coumadin presents today after a fall that occurred while at a bar just prior to arrival.  He states that when he went to stand up he fell backwards and hit the back of his head on the ground.  He is unsure if he loss consciousness.  He sustained a hematoma and abrasion to the back of his head when he fell.  He denies any pain at this time. He denies nausea, vomiting, or vision changes.  No neck or back pain.  No extremity pain.  He reports that he drank 10 beers today and that he drinks alcohol daily.  The history is provided by the patient.    Past Medical History  Diagnosis Date  . Chronic atrial fibrillation   . Echocardiogram abnormal     2009 moderate to severely dilated left atrium  . Hypertension   . Venous stasis     chronic  . Erectile dysfunction   . Dislocation of shoulder, anterior, right, closed 07/24/2014  . Traumatic tear of right rotator cuff 07/24/2014  . Dysrhythmia     af   Past Surgical History  Procedure Laterality Date  . Pilonidal cyst excision    . Lumbar fusion  age 39  . Cardiac catheterization    . Hernia repair  age 36  . Right eyebrow tumor removed  age 10   . Myoview cardiovascular stress test  07/2003    anterior wall thinning, LV systolic function depressed at 33%  . Cardiac catheterization  03/28/2004    normal coronaries, reduced EF at 25-30% (Dr. Gerrie Nordmann)  . Cardioversion, tee guided  03/29/2004    Dr. Gerrie Nordmann   . Knee surgery    . 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 Wanda Plump. [place]  . Transthoracic echocardiogram  08/2008    EF=>55%, mild-mod dilated; LA mod-severely dilated; RA mild-mod  dilated; mild calcif of MV, borderline MVP, trace MR; trace TR; mild pulm valve regurg   . Shoulder arthroscopy with rotator cuff repair and subacromial decompression Right 08/19/2014    Procedure: SHOULDER ARTHROSCOPY WITH ROTATOR CUFF REPAIR AND SUBACROMIAL DECOMPRESSION;  Surgeon: Lorn Junes, MD;  Location: [facility];  Service: Orthopedics;  Laterality: Right;   Family History  Problem Relation Age of Onset  . Hypertension Father   . Hypertension Mother   . Stroke Maternal Grandfather   . Diabetes Paternal Grandfather   . Supraventricular tachycardia Brother   . Supraventricular tachycardia Child    Social History  Substance Use Topics  . Smoking status: Never Smoker   . Smokeless tobacco: None  . Alcohol Use: Yes     Comment: occasionally    Review of Systems  All other systems reviewed and are negative.     Allergies  Review of patient's allergies indicates no known allergies.  Home Medications   Prior to Admission medications   Medication Sig Start Date End Date Taking? Authorizing Provider  benazepril (LOTENSIN) 20 MG tablet take 1 tablet by mouth twice a day 01/31/15   Leonie Man, MD  carvedilol (COREG) 12.5 MG tablet Take 1 tablet (12.5 mg total) by mouth 2 (two) times daily with a  meal. 12/27/14   Leonie Man, MD  hydrochlorothiazide (HYDRODIURIL) 25 MG tablet take 1 tablet by mouth once daily 01/17/15   Leonie Man, MD  Multiple Vitamin (MULTIVITAMIN) tablet Take 1 tablet by mouth daily.      Historical Provider, MD  warfarin (COUMADIN) 6 MG tablet Take 1 tablet by mouth daily or as directed by coumadin clinic 12/23/14   Leonie Man, MD   BP 149/94 mmHg  Pulse 71  Temp(Src) 97.7 F (36.5 C) (Oral)  Resp 30  SpO2 97% Physical Exam  Constitutional: He appears well-developed and well-nourished.  HENT:  Head:    Eyes: EOM are normal. Pupils are equal, round, and reactive to light.  Neck: Neck supple.  Cardiovascular:  Normal rate, regular rhythm and normal heart sounds.   Pulmonary/Chest: Effort normal and breath sounds normal.  Musculoskeletal: Normal range of motion.  Neurological: He is alert. He has normal strength. No cranial nerve deficit or sensory deficit.  Skin: Skin is warm and dry.  Psychiatric: He has a normal mood and affect.  Nursing note and vitals reviewed.   ED Course  Procedures (including critical care time) Labs Review Labs Reviewed - No data to display  Imaging Review Ct Head Wo Contrast  05/30/2015   CLINICAL DATA:  Patient fell backwards tonight onto a tile floor hitting the back of the head. Patient on Coumadin. Hematoma to the back of the head.  EXAM: CT HEAD WITHOUT CONTRAST  CT CERVICAL SPINE WITHOUT CONTRAST  TECHNIQUE: Multidetector CT imaging of the head and cervical spine was performed following the standard protocol without intravenous contrast. Multiplanar CT image reconstructions of the cervical spine were also generated.  COMPARISON:  CT head 06/14/2010  FINDINGS: CT HEAD FINDINGS  Subcutaneous scalp hematomas over the posterior skull at the midline and over the right supraorbital region. Diffuse cerebral atrophy. Ventricular dilatation consistent with central atrophy. Low-attenuation changes in the deep white matter consistent with small vessel ischemia. No mass effect or midline shift. No abnormal extra-axial fluid collections. Gray-white matter junctions are distinct. Basal cisterns are not effaced. No evidence of acute intracranial hemorrhage. No depressed skull fractures. Visualized paranasal sinuses and mastoid air cells are not opacified.  CT CERVICAL SPINE FINDINGS  Reversal of the usual cervical lordosis with slight retrolisthesis of C5 on C6 consistent with degenerative change. No anterior subluxation. Normal alignment of the facet joints. Diffuse degenerative change throughout the cervical spine, most prominent at C5 through C7. Narrowed cervical interspaces with  associated endplate hypertrophic changes. Degenerative changes throughout the facet joints with diffuse uncovertebral spurring causing neural foraminal narrowing bilaterally at multiple levels. C1-2 articulation appears intact. No vertebral compression deformities. No focal bone lesion or bone destruction. No prevertebral soft tissue swelling.  IMPRESSION: Subcutaneous scalp hematomas. No acute intracranial abnormalities. Chronic atrophy and small vessel ischemic changes.  Diffuse degenerative change throughout cervical spine. No acute displaced fractures identified.   Electronically Signed   By: Lucienne Capers M.D.   On: 05/30/2015 22:39   Ct Cervical Spine Wo Contrast  05/30/2015   CLINICAL DATA:  Patient fell backwards tonight onto a tile floor hitting the back of the head. Patient on Coumadin. Hematoma to the back of the head.  EXAM: CT HEAD WITHOUT CONTRAST  CT CERVICAL SPINE WITHOUT CONTRAST  TECHNIQUE: Multidetector CT imaging of the head and cervical spine was performed following the standard protocol without intravenous contrast. Multiplanar CT image reconstructions of the cervical spine were also generated.  COMPARISON:  CT  head 06/14/2010  FINDINGS: CT HEAD FINDINGS  Subcutaneous scalp hematomas over the posterior skull at the midline and over the right supraorbital region. Diffuse cerebral atrophy. Ventricular dilatation consistent with central atrophy. Low-attenuation changes in the deep white matter consistent with small vessel ischemia. No mass effect or midline shift. No abnormal extra-axial fluid collections. Gray-white matter junctions are distinct. Basal cisterns are not effaced. No evidence of acute intracranial hemorrhage. No depressed skull fractures. Visualized paranasal sinuses and mastoid air cells are not opacified.  CT CERVICAL SPINE FINDINGS  Reversal of the usual cervical lordosis with slight retrolisthesis of C5 on C6 consistent with degenerative change. No anterior subluxation.  Normal alignment of the facet joints. Diffuse degenerative change throughout the cervical spine, most prominent at C5 through C7. Narrowed cervical interspaces with associated endplate hypertrophic changes. Degenerative changes throughout the facet joints with diffuse uncovertebral spurring causing neural foraminal narrowing bilaterally at multiple levels. C1-2 articulation appears intact. No vertebral compression deformities. No focal bone lesion or bone destruction. No prevertebral soft tissue swelling.  IMPRESSION: Subcutaneous scalp hematomas. No acute intracranial abnormalities. Chronic atrophy and small vessel ischemic changes.  Diffuse degenerative change throughout cervical spine. No acute displaced fractures identified.   Electronically Signed   By: Lucienne Capers M.D.   On: 05/30/2015 22:39   I have personally reviewed and evaluated these images and lab results as part of my medical decision-making.   EKG Interpretation   Date/Time:  Monday May 30 2015 21:40:17 EDT Ventricular Rate:  69 PR Interval:    QRS Duration: 133 QT Interval:  451 QTC Calculation: 483 R Axis:   52 Text Interpretation:  Atrial fibrillation Nonspecific intraventricular  conduction delay No significant change since last tracing Confirmed by  ZACKOWSKI  MD, SCOTT [id number]) on 05/30/2015 9:44:10 PM      MDM   Final diagnoses:  None   Patient presents today after falling from a standing position at a bar and hitting the back of his head on the ground.  He reports drinking 10 beers today.  Patient alert and coherent upon arrival.  Normal neuro exam.  He is currently on Coumadin for his history of A fib.  INR subtherapeutic.  CT head and cervical spine is negative.  Patient stable for discharge.  Return precautions given.      Hyman Bible, PA-C 06/01/15 [phone]

## 2015-06-22 ENCOUNTER — Ambulatory Visit: Payer: Medicare Other | Admitting: Pharmacist Clinician (PhC)/ Clinical Pharmacy Specialist

## 2015-06-22 ENCOUNTER — Other Ambulatory Visit: Payer: Self-pay | Admitting: Cardiology

## 2015-08-24 ENCOUNTER — Telehealth: Payer: Self-pay | Admitting: Pharmacist Clinician (PhC)/ Clinical Pharmacy Specialist

## 2015-08-24 NOTE — Telephone Encounter (Signed)
Closed encounter °

## 2015-09-16 ENCOUNTER — Other Ambulatory Visit: Payer: Self-pay | Admitting: *Deleted

## 2015-09-16 ENCOUNTER — Encounter: Payer: Self-pay | Admitting: *Deleted

## 2015-09-23 ENCOUNTER — Ambulatory Visit (INDEPENDENT_AMBULATORY_CARE_PROVIDER_SITE_OTHER): Payer: Medicare Other | Admitting: Urology

## 2015-09-23 ENCOUNTER — Encounter: Payer: Self-pay | Admitting: Urology

## 2015-09-23 VITALS — BP 134/86 | HR 84 | Ht 74.0 in | Wt 239.1 lb

## 2015-09-23 DIAGNOSIS — N401 Enlarged prostate with lower urinary tract symptoms: Secondary | ICD-10-CM

## 2015-09-23 DIAGNOSIS — N528 Other male erectile dysfunction: Secondary | ICD-10-CM

## 2015-09-23 DIAGNOSIS — N529 Male erectile dysfunction, unspecified: Secondary | ICD-10-CM

## 2015-09-23 DIAGNOSIS — M25541 Pain in joints of right hand: Secondary | ICD-10-CM | POA: Insufficient documentation

## 2015-09-23 DIAGNOSIS — N138 Other obstructive and reflux uropathy: Secondary | ICD-10-CM

## 2015-09-23 NOTE — Progress Notes (Signed)
09/23/2015 5:06 PM   [name] 01/05/1946 [id number]  Referring provider: Birdie Sons, MD [address] [place], [place] [address]  Chief Complaint  Patient presents with  . Benign Prostatic Hypertrophy    1 year recheck  . Erectile Dysfunction    HPI: Patient is a 69 year old Caucasian male with a history of erectile dysfunction, S/p penile prothesis placement on 12/21/2013, and BPH with LUTS who presents today for his one year follow up.  Erectile dysfunction Patient has a penile prothesis and he is very satisfied with his results.  BPH WITH LUTS His major complaint today is frequent urination and nocturia.  He has had these symptoms for the last several years.  He denies any dysuria, hematuria or suprapubic pain.  He also denies any recent fevers, chills, nausea or vomiting.  He has a family history of PCa, with his father being diagnosed with prostate cancer.      PMH: Past Medical History  Diagnosis Date  . Chronic atrial fibrillation ([facility])   . Echocardiogram abnormal     2009 moderate to severely dilated left atrium  . Hypertension   . Venous stasis     chronic  . Erectile dysfunction   . Dislocation of shoulder, anterior, right, closed 07/24/2014  . Traumatic tear of right rotator cuff 07/24/2014  . Dysrhythmia     af  . BPH with obstruction/lower urinary tract symptoms   . Hypogonadism in male   . Nocturia   . Peyronie's disease   . Gout   . Hyperglycemia   . History of chicken pox   . History of measles     Surgical History: Past Surgical History  Procedure Laterality Date  . Pilonidal cyst excision    . Lumbar fusion  age 58  . Cardiac catheterization    . Hernia repair  age 25  . Right eyebrow tumor removed  age 83   . Myoview cardiovascular stress test  07/2003    anterior wall thinning, LV systolic function depressed at 33%  . Cardiac catheterization  03/28/2004    normal coronaries, reduced EF at 25-30% (Dr. Gerrie Nordmann)    . Cardioversion, tee guided  03/29/2004    Dr. Gerrie Nordmann   . Knee surgery    . 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 Wanda Plump. [place]  . Transthoracic echocardiogram  08/2008    EF=>55%, mild-mod dilated; LA mod-severely dilated; RA mild-mod dilated; mild calcif of MV, borderline MVP, trace MR; trace TR; mild pulm valve regurg   . Shoulder arthroscopy with rotator cuff repair and subacromial decompression Right 08/19/2014    Procedure: SHOULDER ARTHROSCOPY WITH ROTATOR CUFF REPAIR AND SUBACROMIAL DECOMPRESSION;  Surgeon: Lorn Junes, MD;  Location: [facility];  Service: Orthopedics;  Laterality: Right;  . Colonoscopy  05/28/2013    Dr. Rayann Heman    Home Medications:    Medication List       This list is accurate as of: 09/23/15 11:59 PM.  Always use your most recent med list.               benazepril 20 MG tablet  Commonly known as:  LOTENSIN  take 1 tablet by mouth twice a day     carvedilol 12.5 MG tablet  Commonly known as:  COREG  Take 1 tablet (12.5 mg total) by mouth 2 (two) times daily with a meal.     hydrochlorothiazide 25 MG tablet  Commonly known  as:  HYDRODIURIL  take 1 tablet by mouth once daily     metroNIDAZOLE 1 % gel  Commonly known as:  METROGEL  Reported on 09/23/2015     multivitamin tablet  Take 1 tablet by mouth daily.     triamcinolone cream 0.1 %  Commonly known as:  KENALOG  Reported on 09/26/2015     warfarin 6 MG tablet  Commonly known as:  COUMADIN  take 1 tablet by mouth once daily or as directed        Allergies: No Known Allergies  Family History: Family History  Problem Relation Age of Onset  . Hypertension Father   . Prostate cancer Father   . Bladder Cancer Father   . Hypertension Mother   . Stroke Maternal Grandfather   . Diabetes Paternal Grandfather   . Supraventricular tachycardia Brother   . Supraventricular tachycardia Child   . Kidney disease Neg Hx   . Heart attack Neg Hx   . Colon cancer  Neg Hx   . Arthritis Sister     RA  . Diabetes Maternal Uncle     TYPE 2    Social History:  reports that he has never smoked. He does not have any smokeless tobacco history on file. He reports that he drinks alcohol. He reports that he does not use illicit drugs.  ROS: UROLOGY Frequent Urination?: Yes Hard to postpone urination?: No Burning/pain with urination?: No Get up at night to urinate?: Yes Leakage of urine?: No Urine stream starts and stops?: No Trouble starting stream?: No Do you have to strain to urinate?: No Blood in urine?: No Urinary tract infection?: No Sexually transmitted disease?: No Injury to kidneys or bladder?: No Painful intercourse?: No Weak stream?: No Erection problems?: No Penile pain?: No  Gastrointestinal Nausea?: No Vomiting?: No Indigestion/heartburn?: No Diarrhea?: No Constipation?: No  Constitutional Fever: No Night sweats?: No Weight loss?: No Fatigue?: No  Skin Skin rash/lesions?: No Itching?: No  Eyes Blurred vision?: No Double vision?: No  Ears/Nose/Throat Sore throat?: No Sinus problems?: No  Hematologic/Lymphatic Swollen glands?: No Easy bruising?: No  Cardiovascular Leg swelling?: No Chest pain?: No  Respiratory Cough?: No Shortness of breath?: No  Endocrine Excessive thirst?: No  Musculoskeletal Back pain?: No Joint pain?: No  Neurological Headaches?: No Dizziness?: No  Psychologic Depression?: No Anxiety?: No  Physical Exam: BP 134/86 mmHg  Pulse 84  Ht 6\' 2"  (1.88 m)  Wt 239 lb 1.6 oz (108.455 kg)  BMI 30.69 kg/m2  GU: No CVA tenderness.  No bladder fullness or masses.  Patient with circumcised phallus.  Urethral meatus is patent.  No penile discharge. No penile lesions or rashes.  Prothesis cylinder palpated bilateral.  Scrotum without lesions, cysts, rashes and/or edema.  Testicles are located scrotally bilaterally. No masses are appreciated in the testicles. Prothesis pump located in the  right scrotum.  Left and right epididymis are normal. Rectal: Patient with  normal sphincter tone. Anus and perineum without scarring or rashes. No rectal masses are appreciated. Prostate is approximately 50 grams, no nodules are appreciated. Seminal vesicles are normal.   Laboratory Data: Lab Results  Component Value Date   WBC 5.5 05/30/2015   HGB 14.1 05/30/2015   HCT 40.1 05/30/2015   MCV 93.9 05/30/2015   PLT 209 05/30/2015    Lab Results  Component Value Date   CREATININE 1.01 09/26/2015   PSA History  0.7 ng/mL on 09/21/2013  0.5 ng/mL on 02/11/2014  0.7 ng/mL on 09/24/2014  Assessment & Plan:    1. Erectile dysfunction:   Patient is s/p penile prosthesis placement on 12/21/2013.  He is very satisfied with the prosthesis.  We will continue to follow.  He will RTC in one year for SHIM and exam.  2. BPH with LUTS:   Patient has minimal symptoms at this time.  He will return in one year for an IPSS score, exam and PSA.      Return in about 1 year (around 09/22/2016) for IPSS score, SHIM score and exam.  Zara Council, PA-C  [facility] [address], [place], [place] [address] [phone]

## 2015-09-24 LAB — PSA: Prostate Specific Ag, Serum: 0.6 ng/mL (ref 0.0–4.0)

## 2015-09-26 ENCOUNTER — Ambulatory Visit (INDEPENDENT_AMBULATORY_CARE_PROVIDER_SITE_OTHER): Payer: Medicare Other | Admitting: Family Medicine

## 2015-09-26 ENCOUNTER — Encounter: Payer: Self-pay | Admitting: Family Medicine

## 2015-09-26 VITALS — BP 140/80 | HR 76 | Temp 97.6°F | Resp 16 | Ht 74.5 in | Wt 240.0 lb

## 2015-09-26 DIAGNOSIS — Z0000 Encounter for general adult medical examination without abnormal findings: Secondary | ICD-10-CM | POA: Diagnosis not present

## 2015-09-26 DIAGNOSIS — I10 Essential (primary) hypertension: Secondary | ICD-10-CM | POA: Diagnosis not present

## 2015-09-26 DIAGNOSIS — I739 Peripheral vascular disease, unspecified: Secondary | ICD-10-CM

## 2015-09-26 DIAGNOSIS — I482 Chronic atrial fibrillation, unspecified: Secondary | ICD-10-CM

## 2015-09-26 DIAGNOSIS — Z23 Encounter for immunization: Secondary | ICD-10-CM | POA: Diagnosis not present

## 2015-09-26 NOTE — Patient Instructions (Signed)
Try OTC Zyrtec and Mucinex at bedtime due to mucus buildup

## 2015-09-26 NOTE — Progress Notes (Signed)
Patient: [name], Male    DOB: 09-02-1946, 69 y.o.   MRN: [id number] Visit Date: 09/26/2015  Today's Provider: [name], MD   Chief Complaint  Patient presents with  . Annual Exam  . Hypertension  . Atrial Fibrillation    follow up  . Hyperglycemia    follow up  . PVD    follow up   Subjective:    Yearly physical  SAMEL [name] is a 69 y.o. male. He feels well. He reports exercising regularly. He reports he is sleeping fairly well.  -----------------------------------------------------------  Hyperglycemia, Follow-up:   Lab Results  Component Value Date   HGBA1C 5.6 09/20/2014   GLUCOSE 108* 05/30/2015   GLUCOSE 119* 08/18/2014    Last seen for for this 1 years ago.  Management since then includes no changes. Current symptoms include none and have been stable.  Weight trend: increasing Prior visit with dietician: no Current diet: in general, a "healthy" diet   Current exercise: cardiovascular workout on exercise equipment  Pertinent Labs:    Component Value Date/Time   CHOL 205* 08/16/2014   TRIG 70 08/16/2014   TRIG 65 01/06/2009   CREATININE 0.92 05/30/2015 2200   CREATININE 1.0 08/16/2014    Wt Readings from Last 3 Encounters:  09/26/15 240 lb (108.863 kg)  09/23/15 239 lb 1.6 oz (108.455 kg)  12/27/14 230 lb (104.327 kg)    Hypertension, follow-up:  BP Readings from Last 3 Encounters:  09/26/15 140/80  09/23/15 134/86  05/30/15 122/92    He was last seen for hypertension 1 years ago.  BP at that visit was 118/76. Management since that visit includes no changes. He reports excellent compliance with treatment. He is not having side effects.  He is exercising. He is adherent to low salt diet.   Outside blood pressures are 130/80. He is experiencing none.  Patient denies none.   Cardiovascular risk factors include advanced age (older than 21 for men, 27 for women), hypertension and male gender.  Use of agents  associated with hypertension: none.     Weight trend: increasing steadily Wt Readings from Last 3 Encounters:  09/26/15 240 lb (108.863 kg)  09/23/15 239 lb 1.6 oz (108.455 kg)  12/27/14 230 lb (104.327 kg)    Current diet: in general, a "healthy" diet    ------------------------------------------------------------------------ Follow up Atrial Fibrillation:  Followed by Cardiologist Dr. Ellyn Hack.  Follow up Peripheral Vascular disease:  Followed by Cardiologist Dr. Ellyn Hack.   Review of Systems  Constitutional: Positive for unexpected weight change. Negative for fever, chills, appetite change and fatigue.  HENT: Negative for congestion, ear pain, hearing loss, nosebleeds and trouble swallowing.   Eyes: Negative for pain and visual disturbance.  Respiratory: Negative for cough, chest tightness and shortness of breath.   Cardiovascular: Negative for chest pain, palpitations and leg swelling.  Gastrointestinal: Negative for nausea, vomiting, abdominal pain, diarrhea, constipation and blood in stool.  Endocrine: Negative for polydipsia, polyphagia and polyuria.  Genitourinary: Negative for dysuria and flank pain.  Musculoskeletal: Negative for myalgias, back pain, joint swelling, arthralgias and neck stiffness.  Skin: Negative for color change, rash and wound.  Neurological: Negative for dizziness, tremors, seizures, speech difficulty, weakness, light-headedness and headaches.  Psychiatric/Behavioral: Negative for behavioral problems, confusion, sleep disturbance, dysphoric mood and decreased concentration. The patient is not nervous/anxious.   All other systems reviewed and are negative.   Social History   Social History  . Marital Status: Single  Spouse Name: [name]  . Number of Children: 1  . Years of Education: [name]   Occupational History  . retired Pharmacist, hospital    Social History Main Topics  . Smoking status: Never Smoker   . Smokeless tobacco: Not on file  . Alcohol Use: 0.0  oz/week    0 Standard drinks or equivalent per week     Comment: 2-3 beers most days  . Drug Use: No  . Sexual Activity: Not on file   Other Topics Concern  . Not on file   Social History Narrative   Divorced father of 1. Retired Education officer, museum.   ~2 beers / day. Does not smoke.   Works out 6 days a weeks - cardio & weights. He used to run and play basketball prior to his hip surgery in 2007.    Patient Active Problem List   Diagnosis Date Noted  . Arthralgia of right hand 09/23/2015  . Hyperglycemia 08/16/2014  . Dislocation of shoulder, anterior, right, closed 07/24/2014  . Traumatic tear of right rotator cuff 07/24/2014  . Venous stasis dermatitis 11/02/2013  . Pre-operative cardiovascular examination 11/02/2013  . Long term current use of anticoagulant therapy 12/24/2012  . Benign prostatic hypertrophy without urinary obstruction 03/25/2010  . Gouty arthropathy 03/24/2010  . Peripheral vascular disease ([place]) 12/19/2009  . Essential hypertension 11/18/2009  . Chronic atrial fibrillation ([place]) 11/18/2009    Class: Chronic    Past Surgical History  Procedure Laterality Date  . Pilonidal cyst excision    . Lumbar fusion  age 23  . Cardiac catheterization    . Hernia repair  age 89  . Right eyebrow tumor removed  age 17   . Myoview cardiovascular stress test  07/2003    anterior wall thinning, LV systolic function depressed at 33%  . Cardiac catheterization  03/28/2004    normal coronaries, reduced EF at 25-30% (Dr. Gerrie Nordmann)  . Cardioversion, tee guided  03/29/2004    Dr. Gerrie Nordmann   . Knee surgery    . 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 Wanda Plump. [place]  . Transthoracic echocardiogram  08/2008    EF=>55%, mild-mod dilated; LA mod-severely dilated; RA mild-mod dilated; mild calcif of MV, borderline MVP, trace MR; trace TR; mild pulm valve regurg   . Shoulder arthroscopy with rotator cuff repair and subacromial decompression Right 08/19/2014    Procedure:  SHOULDER ARTHROSCOPY WITH ROTATOR CUFF REPAIR AND SUBACROMIAL DECOMPRESSION;  Surgeon: Lorn Junes, MD;  Location: [facility];  Service: Orthopedics;  Laterality: Right;  . Colonoscopy  05/28/2013    Dr. Rayann Heman    His family history includes Arthritis in his sister; Bladder Cancer in his father; Diabetes in his maternal uncle and paternal grandfather; Hypertension in his father and mother; Prostate cancer in his father; Stroke in his maternal grandfather; Supraventricular tachycardia in his brother and child. There is no history of Kidney disease, Heart attack, or Colon cancer.    Previous Medications   BENAZEPRIL (LOTENSIN) 20 MG TABLET    take 1 tablet by mouth twice a day   CARVEDILOL (COREG) 12.5 MG TABLET    Take 1 tablet (12.5 mg total) by mouth 2 (two) times daily with a meal.   HYDROCHLOROTHIAZIDE (HYDRODIURIL) 25 MG TABLET    take 1 tablet by mouth once daily   MULTIPLE VITAMIN (MULTIVITAMIN) TABLET    Take 1 tablet by mouth daily.     WARFARIN (COUMADIN) 6 MG TABLET    take  1 tablet by mouth once daily or as directed    Patient Care Team: Birdie Sons, MD as PCP - General (Family Medicine) Leonie Man, MD as Consulting Physician (Cardiology) Collier Flowers, MD as Referring Physician (Urology) Barrie Dunker, MD (Dermatology) Elsie Saas, MD as Consulting Physician (Orthopedic Surgery)     Objective:   Vitals: BP 140/80 mmHg  Pulse 76  Temp(Src) 97.6 F (36.4 C) (Oral)  Resp 16  Ht 6' 2.5" (1.892 m)  Wt 240 lb (108.863 kg)  BMI 30.41 kg/m2  SpO2 98%  Physical Exam   General Appearance:    Alert, cooperative, no distress, appears stated age  Head:    Normocephalic, without obvious abnormality, atraumatic  Eyes:    PERRL, conjunctiva/corneas clear, EOM's intact, fundi    benign, both eyes       Ears:    Normal TM's and external ear canals, both ears  Nose:   Nares normal, septum midline, mucosa normal, no drainage   or sinus tenderness   Throat:   Lips, mucosa, and tongue normal; teeth and gums normal  Neck:   Supple, symmetrical, trachea midline, no adenopathy;       thyroid:  No enlargement/tenderness/nodules; no carotid   bruit or JVD  Back:     Symmetric, no curvature, ROM normal, no CVA tenderness  Lungs:     Clear to auscultation bilaterally, respirations unlabored  Chest wall:    No tenderness or deformity  Heart:    Irregularly irregular.  S1 and S2 normal, no murmur, rub   or gallop  Abdomen:     Soft, non-tender, bowel sounds active all four quadrants,    no masses, no organomegaly  Genitalia:    deferred  Rectal:    deferred  Extremities:   Extremities normal, atraumatic, no cyanosis or edema  Pulses:   2+ and symmetric all extremities  Skin:   Darkly pigmented. no rashes or other suspicious lesions. Followed by Dr. Solon Palm.   Lymph nodes:   Cervical, supraclavicular, and axillary nodes normal  Neurologic:   CNII-XII intact. Normal strength, sensation and reflexes      throughout    Activities of Daily Living In your present state of health, do you have any difficulty performing the following activities: 09/26/2015  Hearing? N  Vision? N  Difficulty concentrating or making decisions? N  Walking or climbing stairs? N  Dressing or bathing? N  Doing errands, shopping? N    Fall Risk Assessment Fall Risk  09/26/2015  Falls in the past year? Yes  Number falls in past yr: 1  Injury with Fall? No  Follow up Falls evaluation completed     Depression Screen PHQ 2/9 Scores 09/26/2015  PHQ - 2 Score 0  PHQ- 9 Score 0    Cognitive Testing - 6-CIT  Correct? Score   What year is it? yes 0 0 or 4  What month is it? yes 0 0 or 3  Memorize:    Pia Mau,  44,  [place],      What time is it? (within 1 hour) yes 0 0 or 3  Count backwards from 20 yes 0 0, 2, or 4  Name the months of the year yes 0 0, 2, or 4  Repeat name & address above no 3 0, 2, 4, 6, 8, or 10       TOTAL SCORE  3/28     Interpretation:  Normal  Normal (0-7)  Abnormal (8-28)     Audit-C Alcohol Use Screening  Question Answer Points  How often do you have alcoholic drink? 4 times weekly 4  On days you do drink alcohol, how many drinks do you typically consume? 3 or 4 1  How oftey will you drink 6 or more in a total? monthly 2  Total Score:  7   A score of 3 or more in women, and 4 or more in men indicates increased risk for alcohol abuse, EXCEPT if all of the points are from question 1.   Assessment & Plan:     Yearly physical  Reviewed patient's Family Medical History Reviewed and updated list of patient's medical providers Assessment of cognitive impairment was done Assessed patient's functional ability Established a written schedule for health screening [place] Completed and Reviewed  Exercise Activities and Dietary recommendations Goals    None      Immunization History  Administered Date(s) Administered  . Pneumococcal Conjugate-13 09/20/2014  . Tdap 03/27/2011  . Zoster 03/24/2010    Health Maintenance  Topic Date Due  . PNA vac Low Risk Adult (2 of 2 - PPSV23) 09/21/2015  . INFLUENZA VACCINE  01/06/2016 (Originally 05/09/2015)  . TETANUS/TDAP  03/26/2021  . COLONOSCOPY  05/29/2023  . ZOSTAVAX  Completed  . Hepatitis C Screening  Completed      Discussed health benefits of physical activity, and encouraged him to engage in regular exercise appropriate for his age and condition.    ------------------------------------------------------------------------------------------------------------ 1. Annual physical exam Doing very well. Had flu vaccine at [facility]. Up to date on health maintence  2. Chronic atrial fibrillation (HCC) Asympomatic. Continue routine follow up with cardiology  3. Essential hypertension Well controlled.  Continue current medications.   - Renal function panel - Lipid panel  4. Need for pneumococcal vaccination Given  Pneumovax today.

## 2015-09-27 ENCOUNTER — Telehealth: Payer: Self-pay

## 2015-09-27 ENCOUNTER — Encounter: Payer: Self-pay | Admitting: Family Medicine

## 2015-09-27 DIAGNOSIS — N529 Male erectile dysfunction, unspecified: Secondary | ICD-10-CM | POA: Insufficient documentation

## 2015-09-27 DIAGNOSIS — N401 Enlarged prostate with lower urinary tract symptoms: Principal | ICD-10-CM

## 2015-09-27 DIAGNOSIS — N138 Other obstructive and reflux uropathy: Secondary | ICD-10-CM | POA: Insufficient documentation

## 2015-09-27 LAB — RENAL FUNCTION PANEL
Albumin: 4.6 g/dL (ref 3.6–4.8)
BUN / CREAT RATIO: 14 (ref 10–22)
BUN: 14 mg/dL (ref 8–27)
CALCIUM: 10.3 mg/dL — AB (ref 8.6–10.2)
CO2: 28 mmol/L (ref 18–29)
CREATININE: 1.01 mg/dL (ref 0.76–1.27)
Chloride: 93 mmol/L — ABNORMAL LOW (ref 96–106)
GFR, EST AFRICAN AMERICAN: 89 mL/min/{1.73_m2} (ref >59)
GFR, EST NON AFRICAN AMERICAN: 77 mL/min/{1.73_m2} (ref >59)
Glucose: 118 mg/dL — ABNORMAL HIGH (ref 65–99)
Phosphorus: 3.5 mg/dL (ref 2.5–4.5)
Potassium: 4.4 mmol/L (ref 3.5–5.2)
SODIUM: 138 mmol/L (ref 134–144)

## 2015-09-27 LAB — LIPID PANEL
CHOL/HDL RATIO: 2.6 ratio (ref 0.0–5.0)
Cholesterol, Total: 191 mg/dL (ref 100–199)
HDL: 74 mg/dL (ref >39)
LDL CALC: 102 mg/dL — AB (ref 0–99)
Triglycerides: 76 mg/dL (ref 0–149)
VLDL Cholesterol Cal: 15 mg/dL (ref 5–40)

## 2015-09-27 NOTE — Telephone Encounter (Signed)
-----   Message from Nori Riis, PA-C sent at 09/25/2015  8:16 PM EST ----- PSA is stable.  We will see him in one year.

## 2015-09-27 NOTE — Telephone Encounter (Signed)
LMOM

## 2015-09-27 NOTE — Telephone Encounter (Signed)
Spoke with pt in reference to lab results. Pt voiced understanding.  

## 2015-10-12 ENCOUNTER — Ambulatory Visit (INDEPENDENT_AMBULATORY_CARE_PROVIDER_SITE_OTHER): Payer: Medicare Other | Admitting: Pharmacist Clinician (PhC)/ Clinical Pharmacy Specialist

## 2015-10-12 DIAGNOSIS — I482 Chronic atrial fibrillation, unspecified: Secondary | ICD-10-CM

## 2015-10-12 DIAGNOSIS — Z7901 Long term (current) use of anticoagulants: Secondary | ICD-10-CM

## 2015-10-12 LAB — POCT INR: INR: 2.4

## 2015-12-07 ENCOUNTER — Encounter: Payer: Self-pay | Admitting: Cardiology

## 2015-12-07 ENCOUNTER — Ambulatory Visit (INDEPENDENT_AMBULATORY_CARE_PROVIDER_SITE_OTHER): Payer: Medicare Other | Admitting: Pharmacist Clinician (PhC)/ Clinical Pharmacy Specialist

## 2015-12-07 ENCOUNTER — Telehealth: Payer: Self-pay

## 2015-12-07 ENCOUNTER — Ambulatory Visit (INDEPENDENT_AMBULATORY_CARE_PROVIDER_SITE_OTHER): Payer: Medicare Other | Admitting: Cardiology

## 2015-12-07 VITALS — BP 126/72 | HR 76 | Ht 75.0 in | Wt 236.0 lb

## 2015-12-07 DIAGNOSIS — I482 Chronic atrial fibrillation, unspecified: Secondary | ICD-10-CM

## 2015-12-07 DIAGNOSIS — R6889 Other general symptoms and signs: Secondary | ICD-10-CM | POA: Diagnosis not present

## 2015-12-07 DIAGNOSIS — Z7901 Long term (current) use of anticoagulants: Secondary | ICD-10-CM | POA: Diagnosis not present

## 2015-12-07 DIAGNOSIS — I8311 Varicose veins of right lower extremity with inflammation: Secondary | ICD-10-CM

## 2015-12-07 DIAGNOSIS — I872 Venous insufficiency (chronic) (peripheral): Secondary | ICD-10-CM

## 2015-12-07 DIAGNOSIS — I8312 Varicose veins of left lower extremity with inflammation: Secondary | ICD-10-CM

## 2015-12-07 DIAGNOSIS — I10 Essential (primary) hypertension: Secondary | ICD-10-CM

## 2015-12-07 LAB — POCT INR: INR: 2.1

## 2015-12-07 MED ORDER — CARVEDILOL 12.5 MG PO TABS: ORAL_TABLET | ORAL | Status: DC

## 2015-12-07 MED ORDER — RIVAROXABAN 20 MG PO TABS: 20.0000 mg | ORAL_TABLET | Freq: Every day | ORAL | Status: DC

## 2015-12-07 NOTE — Patient Instructions (Signed)
Stop Coumadin   Start Xarelto 20 mg daily with supper   Decrease Carvedilol 12.5 mg 1/2 tablet twice a day.  May take a extra 1/2 tablet if has fast heart beat   Call Ivin Booty next week to report condition   Your physician wants you to follow-up in: 6 months. You will receive a reminder letter in the mail two months in advance. If you don't receive a letter, please call our office to schedule the follow-up appointment.

## 2015-12-07 NOTE — Telephone Encounter (Signed)
[facility] called [phone].ID # [id number].Prior Authorization obtained for xarelto. PA # [id number].Approved until 10/07/16.

## 2015-12-07 NOTE — Progress Notes (Signed)
PCP: [name] Huh, MD  Clinic Note: Chief Complaint  Patient presents with  . Follow-up    Patient has no complaints.  . Atrial Fibrillation    Has questions about heart rate response and changing anticoagulation    HPI: [name] is a 70 y.o. male with a PMH below who presents today for annual f/u Chronic/PAF.  Diagnosis with A. fib in 2005 - combination of A. fib RVR and nonischemic cardiomyopathy. Improved following cardioversion  Cardiac catheterization which showed no evidence of coronary disease.  EF was estimated at roughly 30% with global hypokinesis. -- s/p TEE guided cardioversion --> reverted back to A. fib RVR again after taking cold medication/decongestant.  It would appear that since then he has been in chronic stable atrial fibrillation. He has an irregularly warfarin, and having no difficulties.   His repeat echocardiogram in November 2009 showed an improved EF of greater than 55% with moderately dilated right atrium and ventricle as well as severely dilated left atrium.   He has been rate controlled since.    [name] was last seen on 12/27/2014 -- he notedHis only major symptoms he is noted is that he gets dyspneic climbing stairs and bending over in the morning tying shoes. Otherwise he is very active and exercises almost 6 days a week. He says out of the 365 days last year he worked out 348 days.   Recent Hospitalizations: n/a  Studies Reviewed: none  Interval History: [name] presents today really with no complaints. The very first thing he has to me is, "can I changed from Coumadin to Xarelto?".  He has seen the [employer] commercials and would love to build take a medicine that doesn't interact with what he was to eat. He was ability green's and take antibiotic if necessary without worrying about bleeding. He also wants to have something that doesn't require someone to get back to therapeutic level. He also indicates that he's been a little  bit more "sluggish of late. He gets short of breath Stairs or goes up a hill. The walking flat he does fine.  He goes to the gymn several days a week and walks on treadmill and does other cardio exercise. He said on the treadmill is really unable to get his heart rate above 80s. He pushes any heart or, he just simply gets more short of breath. He never has any chest tightness or pressure. He denies any rapid or irregular heartbeat sensation. Way of knowing if he is or is not in A. fib. He also says that when he walks and does his exerting himself he is feeling his legs in general does feel weak and tired. As study stops they feel better. When he is not walking fast doesn't notice this.  No PND, orthopnea or edema.  No palpitations, lightheadedness, dizziness, weakness or syncope/near syncope. No TIA/amaurosis fugax symptoms. No melena, hematochezia, hematuria, or epstaxis. No claudication.  ROS: A comprehensive was performed. Review of Systems  Constitutional: Positive for weight loss (Intentional) and malaise/fatigue (Difficulty with exercise tolerance due to inability to increase heart rate).  HENT: Negative for congestion and nosebleeds.   Cardiovascular: Negative for claudication (I don't think the leg weakness sounds like claudication. Continue to follow) and leg swelling.  Gastrointestinal: Negative for blood in stool and melena.  Musculoskeletal:       His legs just feel weak after walking  Neurological: Negative for dizziness and headaches.  Endo/Heme/Allergies: Bruises/bleeds easily.  Psychiatric/Behavioral: Negative for depression  and memory loss.  All other systems reviewed and are negative.   Past Medical History  Diagnosis Date  . Chronic atrial fibrillation ([name])   . Echocardiogram abnormal     2009 moderate to severely dilated left atrium  . Hypertension   . Venous stasis     chronic  . Erectile dysfunction   . Dislocation of shoulder, anterior, right, closed  07/24/2014  . Traumatic tear of right rotator cuff 07/24/2014  . Dysrhythmia     af  . BPH with obstruction/lower urinary tract symptoms   . Hypogonadism in male   . Nocturia   . Peyronie's disease   . Gout   . Hyperglycemia   . History of chicken pox   . History of measles     Past Surgical History  Procedure Laterality Date  . Pilonidal cyst excision    . Lumbar fusion  age 66  . Cardiac catheterization    . Hernia repair  age 83  . Right eyebrow tumor removed  age 42   . Myoview cardiovascular stress test  07/2003    anterior wall thinning, LV systolic function depressed at 33%  . Cardiac catheterization  03/28/2004    normal coronaries, reduced EF at 25-30% (Dr. Gerrie [name])  . Cardioversion, tee guided  03/29/2004    Dr. Gerrie [name]   . Knee surgery    . 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 Wanda Plump. [name]  . Transthoracic echocardiogram  08/2008    EF=>55%, mild-mod dilated; LA mod-severely dilated; RA mild-mod dilated; mild calcif of MV, borderline MVP, trace MR; trace TR; mild pulm valve regurg   . Shoulder arthroscopy with rotator cuff repair and subacromial decompression Right 08/19/2014    Procedure: SHOULDER ARTHROSCOPY WITH ROTATOR CUFF REPAIR AND SUBACROMIAL DECOMPRESSION;  Surgeon: [name] Junes, MD;  Location: [place];  Service: Orthopedics;  Laterality: Right;  . Colonoscopy  05/28/2013    Dr. Rayann [name]   Prior to Admission medications   Medication Sig Start Date End Date Taking? Authorizing Provider  benazepril (LOTENSIN) 20 MG tablet take 1 tablet by mouth twice a day 01/31/15  Yes [name] Man, MD  carvedilol (COREG) 12.5 MG tablet Take 1 tablet (12.5 mg total) by mouth 2 (two) times daily with a meal. 12/27/14  Yes [name] Man, MD  hydrochlorothiazide (HYDRODIURIL) 25 MG tablet take 1 tablet by mouth once daily 01/17/15  Yes [name] Man, MD  Multiple Vitamin (MULTIVITAMIN) tablet Take 1 tablet by mouth daily.     Yes Historical  Provider, MD  warfarin (COUMADIN) 6 MG tablet take 1 tablet by mouth once daily or as directed 06/22/15  Yes [name] Man, MD   No Known Allergies   Social History   Social History  . Marital Status: Single    Spouse Name: [name]  . Number of Children: 1  . Years of Education: [name]   Occupational History  . retired Pharmacist, hospital    Social History Main Topics  . Smoking status: Never Smoker   . Smokeless tobacco: None  . Alcohol Use: 0.0 oz/week    0 Standard drinks or equivalent per week     Comment: 2-3 beers most days  . Drug Use: No  . Sexual Activity: Not Asked   Other Topics Concern  . None   Social History Narrative   Divorced father of 1. Retired Education officer, museum.   ~2 beers / day. Does not smoke.   Works out 6  days a weeks - cardio & weights. He used to run and play basketball prior to his hip surgery in 2007.   Family History  Problem Relation Age of Onset  . Hypertension Father   . Prostate cancer Father   . Bladder Cancer Father   . Hypertension Mother   . Stroke Maternal Grandfather   . Diabetes Paternal Grandfather   . Supraventricular tachycardia Brother   . Supraventricular tachycardia Child   . Kidney disease Neg Hx   . Heart attack Neg Hx   . Colon cancer Neg Hx   . Arthritis Sister     RA  . Diabetes Maternal Uncle     TYPE 2    Wt Readings from Last 3 Encounters:  12/07/15 236 lb (107.049 kg)  09/26/15 240 lb (108.863 kg)  09/23/15 239 lb 1.6 oz (108.455 kg)    PHYSICAL EXAM BP 126/72 mmHg  Pulse 76  Ht 6\' 3"  (1.905 m)  Wt 236 lb (107.049 kg)  BMI 29.50 kg/m2 General:, he is a very pleasant, healthy-appearing gentleman; well-groomed, well-nourished, in no acute distress. He is deeply tanned, A&O x3, answers questions appropriately. Pleasant mood & affect HEENT: NCAT. EOMI. MMM. Anicteric sclerae.  Neck: Supple. No LAN, JVD or carotid bruit.  Lungs: CTAB. Nonlabored. Normal effort. Good air movement.  Heart: Irregularly irregular.  Normal S1, S2. No M/R/G. Nondisplaced PMI. I do not hear a right-sided carotid bruit that Dr.Little heard.  Abdomen is soft, NT, ND. NABS. No HSM.  Extremities: No C/C, but trace edema on the left side. Neuro: CN II-XII grossly intact.  Non-focal.    Adult ECG Report Not checked.   Other studies Reviewed: Additional studies/ records that were reviewed today include:  Recent Labs:   Lab Results  Component Value Date   CHOL 191 09/26/2015   HDL 74 09/26/2015   LDLCALC 102* 09/26/2015   TRIG 76 09/26/2015   CHOLHDL 2.6 09/26/2015     ASSESSMENT / PLAN: Problem List Items Addressed This Visit    Venous stasis dermatitis (Chronic)    Stable minimal edema. Continue HCTZ. I don't think this heart failure related. We discussed potentially going back to compression stockings if this worsens.      Long term current use of anticoagulant therapy    Converting from warfarin to Xarelto.      Exercise intolerance    It sounds like he has true chronotropic incompetence level of exercise intolerance, but says that when he walks a treadmill he can I get his heart rate above 80s. I'm worried that he may be over beta blocker. Plan will be to gradually reduce and to 6.25 twice a day from 12.5 twice a day of carvedilol. This may require [place] adjusting his other antihypertensives, for now I think having more energy from less beta-blockade will be helpful.      Essential hypertension (Chronic)    Well-controlled currently, however we are reducing his carvedilol dose. May need to increase ACE inhibitor dose.      Relevant Medications   rivaroxaban (XARELTO) 20 MG TABS tablet   carvedilol (COREG) 12.5 MG tablet   Chronic atrial fibrillation (HCC) - Primary (Chronic)    Remains asymptomatic. Rate controlled with carvedilol. I am concerned about over rate control. Will back off beta blocker dose. This patients CHA2DS2-VASc Score and unadjusted Ischemic Stroke Rate (% per year) is equal to 3.2 %  stroke rate/year from a score of 3 -- age 69, hypertension, history of CHF  Per  his request, we will convert from warfarin to Xarelto for anticoagulation. He will follow this up with Tommy Medal, RPH-CCP, who has been following his INR levels.       Relevant Medications   rivaroxaban (XARELTO) 20 MG TABS tablet   carvedilol (COREG) 12.5 MG tablet      Current medicines are reviewed at length with the patient today. (+/- concerns) try switch from warfarin to Xarelto; unable to increase heart rate greater than 40 with exercise The following changes have been made:   Stop Coumadin - do not take tonite  Start Xarelto 20 mg daily with supper -- discussed [employer] commercials & concerns, He is more interested in [employer]   Decrease Carvedilol 12.5 mg 1/2 tablet twice a day.  May take a extra 1/2 tablet if has fast heart beat  Call Ivin Booty next week to report condition   Your physician wants you to follow-up in: 6 months.   Studies Ordered:   No orders of the defined types were placed in this encounter.      [name], M.D., M.S. Interventional Cardiologist   Pager # [phone] Phone # [phone] [address] [place], [place] [address]

## 2015-12-08 ENCOUNTER — Other Ambulatory Visit: Payer: Self-pay | Admitting: *Deleted

## 2015-12-08 MED ORDER — BENAZEPRIL HCL 20 MG PO TABS: 20.0000 mg | ORAL_TABLET | Freq: Two times a day (BID) | ORAL | Status: DC

## 2015-12-09 ENCOUNTER — Encounter: Payer: Self-pay | Admitting: Cardiology

## 2015-12-09 DIAGNOSIS — R6889 Other general symptoms and signs: Secondary | ICD-10-CM | POA: Insufficient documentation

## 2015-12-09 NOTE — Assessment & Plan Note (Signed)
It sounds like he has true chronotropic incompetence level of exercise intolerance, but says that when he walks a treadmill he can I get his heart rate above 80s. I'm worried that he may be over beta blocker. Plan will be to gradually reduce and to 6.25 twice a day from 12.5 twice a day of carvedilol. This may require [place] adjusting his other antihypertensives, for now I think having more energy from less beta-blockade will be helpful.

## 2015-12-09 NOTE — Assessment & Plan Note (Signed)
Stable minimal edema. Continue HCTZ. I don't think this heart failure related. We discussed potentially going back to compression stockings if this worsens.

## 2015-12-09 NOTE — Assessment & Plan Note (Addendum)
Remains asymptomatic. Rate controlled with carvedilol. I am concerned about over rate control. Will back off beta blocker dose. This patients CHA2DS2-VASc Score and unadjusted Ischemic Stroke Rate (% per year) is equal to 3.2 % stroke rate/year from a score of 3 -- age 70, hypertension, history of CHF  Per his request, we will convert from warfarin to Xarelto for anticoagulation. He will follow this up with Tommy Medal, RPH-CCP, who has been following his INR levels.

## 2015-12-09 NOTE — Assessment & Plan Note (Signed)
Well-controlled currently, however we are reducing his carvedilol dose. May need to increase ACE inhibitor dose.

## 2015-12-09 NOTE — Assessment & Plan Note (Signed)
Converting from warfarin to Xarelto.

## 2015-12-13 ENCOUNTER — Telehealth: Payer: Self-pay | Admitting: Cardiology

## 2015-12-13 DIAGNOSIS — R195 Other fecal abnormalities: Secondary | ICD-10-CM

## 2015-12-13 NOTE — Telephone Encounter (Signed)
Spoke with pt, he had decreased his carvedilol to 1/2 tablet twice daily and started the xarelto around the same time. He did not have a bowel movement in 3 days and then it was dark and tacky by his report. He also has had an episode of lightheadedness after working out, when taking a shower. He denies any other problems. Offered to talk to pharm md about blood work and checking blood for stool but the pt wanted me to forward this to dr harding instead. Pt aware dr Ellyn Hack nor sharon are in the office today but will forward this note for their review.

## 2015-12-13 NOTE — Telephone Encounter (Signed)
Order Guaiac Stool Cards & CBC  [name], [name] Green, MD

## 2015-12-13 NOTE — Telephone Encounter (Signed)
New message      Pt c/o medication issue:  1. Name of Medication: xarelto 2. How are you currently taking this medication (dosage and times per day)? 20mg  daily 3. Are you having a reaction (difficulty breathing--STAT)? no  4. What is your medication issue?   Since on xarelto---pt has been constipated. When he finally went to the bathroom, his stool was black and "tarry".  Pt also feels lightheaded and nausea on stomach.  Pt states his carvedilol dosage was also changed.  Please call

## 2015-12-14 LAB — CBC
HEMATOCRIT: 42.8 % (ref 39.0–52.0)
HEMOGLOBIN: 14.7 g/dL (ref 13.0–17.0)
MCH: 32.9 pg (ref 26.0–34.0)
MCHC: 34.3 g/dL (ref 30.0–36.0)
MCV: 95.7 fL (ref 78.0–100.0)
MPV: 9.8 fL (ref 8.6–12.4)
Platelets: 326 10*3/uL (ref 150–400)
RBC: 4.47 MIL/uL (ref 4.22–5.81)
RDW: 13.6 % (ref 11.5–15.5)
WBC: 5 10*3/uL (ref 4.0–10.5)

## 2015-12-14 NOTE — Telephone Encounter (Signed)
Follow up ° ° ° ° ° °Returning a call to the nurse °

## 2015-12-14 NOTE — Telephone Encounter (Signed)
Spoke with pt, aware of dr harding's recommendations. Directions to the [facility] given. Encouraged pt to go to the lab today.

## 2015-12-14 NOTE — Telephone Encounter (Signed)
Left message for pt to call.

## 2016-01-10 ENCOUNTER — Encounter: Payer: Self-pay | Admitting: Cardiology

## 2016-01-10 NOTE — Telephone Encounter (Signed)
Pt states he is returning a call to debra-pls call [phone]

## 2016-01-10 NOTE — Telephone Encounter (Signed)
This encounter was created in error - please disregard.

## 2016-01-17 ENCOUNTER — Encounter: Payer: Medicare Other | Admitting: Pharmacist Clinician (PhC)/ Clinical Pharmacy Specialist

## 2016-02-08 ENCOUNTER — Telehealth: Payer: Self-pay | Admitting: Cardiology

## 2016-02-08 ENCOUNTER — Other Ambulatory Visit: Payer: Self-pay | Admitting: *Deleted

## 2016-02-08 MED ORDER — HYDROCHLOROTHIAZIDE 25 MG PO TABS: 25.0000 mg | ORAL_TABLET | Freq: Every day | ORAL | Status: DC

## 2016-02-08 MED ORDER — BENAZEPRIL HCL 20 MG PO TABS: 20.0000 mg | ORAL_TABLET | Freq: Two times a day (BID) | ORAL | Status: DC

## 2016-02-08 NOTE — Telephone Encounter (Signed)
New message      Pt states after being on his legs for a while, they become weak and "puffy".. He has been out of his fluid pill for about 1 week.  Please call

## 2016-02-08 NOTE — Telephone Encounter (Signed)
Spoke to patient Medication was e sent  Restart HCTZ--- continue to monitor for a few days If swelling does not get better call office Patient verbalized understanding

## 2016-02-08 NOTE — Telephone Encounter (Signed)
REFILL 

## 2016-05-18 ENCOUNTER — Ambulatory Visit: Payer: Medicare Other | Admitting: Urology

## 2016-05-24 ENCOUNTER — Ambulatory Visit (INDEPENDENT_AMBULATORY_CARE_PROVIDER_SITE_OTHER): Payer: Medicare Other | Admitting: Urology

## 2016-05-24 ENCOUNTER — Encounter: Payer: Self-pay | Admitting: Urology

## 2016-05-24 VITALS — BP 179/80 | HR 78 | Ht 75.0 in | Wt 245.7 lb

## 2016-05-24 DIAGNOSIS — N528 Other male erectile dysfunction: Secondary | ICD-10-CM

## 2016-05-24 DIAGNOSIS — N529 Male erectile dysfunction, unspecified: Secondary | ICD-10-CM

## 2016-05-24 NOTE — Progress Notes (Signed)
05/24/2016 8:49 AM   [name] 11/22/1945 [id number]  Referring provider: Birdie Sons, MD [address] [place], [place] [address]  Chief Complaint  Patient presents with  . Advice Only    penile prosthesis questions    HPI: Patient is a 69 year old Caucasian male with a history of erectile dysfunction, s/p penile prothesis placement on 12/21/2013, and BPH with LUTS with questions concerning his prothesis.  Erectile dysfunction He states that for the last two months he has noticed a sensation of "electricity" (not painful)  going through the pump on deflation and on inflation he is noticing the pump walls are able to be pressed together.   The pump is still working and giving him very firm erections that are giving him satisfactory intercourse.  He is not experience penile or scrotal pain.  He is not having dysuria, penile or scrotal swelling, or penile discharge.  He also denies abdominal pain, fevers, chills, nausea and vomiting.     PMH: Past Medical History:  Diagnosis Date  . BPH with obstruction/lower urinary tract symptoms   . Chronic atrial fibrillation ([place])   . Dislocation of shoulder, anterior, right, closed 07/24/2014  . Dysrhythmia    af  . Echocardiogram abnormal    2009 moderate to severely dilated left atrium  . Erectile dysfunction   . Gout   . History of chicken pox   . History of measles   . Hyperglycemia   . Hypertension   . Hypogonadism in male   . Nocturia   . Peyronie's disease   . Traumatic tear of right rotator cuff 07/24/2014  . Venous stasis    chronic    Surgical History: Past Surgical History:  Procedure Laterality Date  . CARDIAC CATHETERIZATION    . CARDIAC CATHETERIZATION  03/28/2004   normal coronaries, reduced EF at 25-30% (Dr. Gerrie Nordmann)  . CARDIOVERSION, TEE guided  03/29/2004   Dr. Gerrie Nordmann   . COLONOSCOPY  05/28/2013   Dr. Rayann Heman  . HERNIA REPAIR  age 19  . KNEE SURGERY    . LUMBAR FUSION  age 49  .  MYOVIEW CARDIOVASCULAR STRESS TEST  07/2003   anterior wall thinning, LV systolic function depressed at 33%  . PILONIDAL CYST EXCISION    . Right Eyebrow Tumor Removed  age 45   . SHOULDER ARTHROSCOPY WITH ROTATOR CUFF REPAIR AND SUBACROMIAL DECOMPRESSION Right 08/19/2014   Procedure: SHOULDER ARTHROSCOPY WITH ROTATOR CUFF REPAIR AND SUBACROMIAL DECOMPRESSION;  Surgeon: Lorn Junes, MD;  Location: [facility];  Service: Orthopedics;  Laterality: Right;  . TOTAL HIP ARTHROPLASTY Right 03/25/2006   Dr. Wanda Plump. Aluisio  . TRANSTHORACIC ECHOCARDIOGRAM  08/2008   EF=>55%, mild-mod dilated; LA mod-severely dilated; RA mild-mod dilated; mild calcif of MV, borderline MVP, trace MR; trace TR; mild pulm valve regurg     Home Medications:    Medication List       Accurate as of 05/24/16  8:49 AM. Always use your most recent med list.          benazepril 20 MG tablet Commonly known as:  LOTENSIN Take 1 tablet (20 mg total) by mouth 2 (two) times daily.   carvedilol 12.5 MG tablet Commonly known as:  COREG Take 1/2 tablet twice a day  May take extra 1/2 tablet if needed for fast heart beat   hydrochlorothiazide 25 MG tablet Commonly known as:  HYDRODIURIL Take 1 tablet (25 mg total) by mouth daily.  multivitamin tablet Take 1 tablet by mouth daily.   rivaroxaban 20 MG Tabs tablet Commonly known as:  XARELTO Take 1 tablet (20 mg total) by mouth daily with supper.       Allergies: No Known Allergies  Family History: Family History  Problem Relation Age of Onset  . Hypertension Father   . Prostate cancer Father   . Bladder Cancer Father   . Hypertension Mother   . Stroke Maternal Grandfather   . Diabetes Paternal Grandfather   . Supraventricular tachycardia Brother   . Arthritis Sister     RA  . Diabetes Maternal Uncle     TYPE 2  . Supraventricular tachycardia Child   . Kidney disease Neg Hx   . Heart attack Neg Hx   . Colon cancer Neg Hx     Social  History:  reports that he has never smoked. He has never used smokeless tobacco. He reports that he drinks alcohol. He reports that he does not use drugs.  ROS: UROLOGY Frequent Urination?: No Hard to postpone urination?: No Burning/pain with urination?: No Get up at night to urinate?: No Leakage of urine?: No Urine stream starts and stops?: No Trouble starting stream?: No Do you have to strain to urinate?: No Blood in urine?: No Urinary tract infection?: No Sexually transmitted disease?: No Injury to kidneys or bladder?: No Painful intercourse?: No Weak stream?: No Erection problems?: No Penile pain?: No  Gastrointestinal Nausea?: No Vomiting?: No Indigestion/heartburn?: No Diarrhea?: No Constipation?: No  Constitutional Fever: No Night sweats?: No Weight loss?: No Fatigue?: No  Skin Skin rash/lesions?: No Itching?: No  Eyes Blurred vision?: No Double vision?: No  Ears/Nose/Throat Sore throat?: No Sinus problems?: No  Hematologic/Lymphatic Swollen glands?: No Easy bruising?: No  Cardiovascular Leg swelling?: No Chest pain?: No  Respiratory Cough?: No Shortness of breath?: No  Endocrine Excessive thirst?: No  Musculoskeletal Back pain?: No Joint pain?: No  Neurological Headaches?: No Dizziness?: No  Psychologic Depression?: No Anxiety?: No  Physical Exam: BP (!) 179/80   Pulse 78   Ht 6\' 3"  (1.905 m)   Wt 245 lb 11.2 oz (111.4 kg)   BMI 30.71 kg/m   GU: No CVA tenderness.  No bladder fullness or masses.  Patient with circumcised phallus.  Urethral meatus is patent.  No penile discharge. No penile lesions or rashes.  Prothesis cylinder palpated bilateral.  Scrotum without lesions, cysts, rashes and/or edema.  Testicles are located scrotally bilaterally. No masses are appreciated in the testicles. Prothesis pump located in the right scrotum.  Left and right epididymis are normal. Rectal: Patient with  normal sphincter tone. Anus and  perineum without scarring or rashes. No rectal masses are appreciated. Prostate is approximately 50 grams, no nodules are appreciated. Seminal vesicles are normal.  The pump was squeezed and the prothesis was inflated.  The cylinders were very firm.  I did feel the "electricity" sensation in the pump on deflation.  I feel like this may be the fluid running through the tubing or small bubbles traveling through the tubes.    Laboratory Data: Lab Results  Component Value Date   WBC 5.0 12/14/2015   HGB 14.7 12/14/2015   HCT 42.8 12/14/2015   MCV 95.7 12/14/2015   PLT 326 12/14/2015    Lab Results  Component Value Date   CREATININE 1.01 09/26/2015   PSA History  0.7 ng/mL on 09/21/2013  0.5 ng/mL on 02/11/2014  0.7 ng/mL on 09/24/2014  0.6 ng/mL on 09/23/2015  Assessment & Plan:    1. Erectile dysfunction:   Patient is s/p penile prosthesis placement on 12/21/2013.  I His questions are answered.  He is reassured that the prosthesis functioning properly.    2. BPH with LUTS:   Not addressed at this visit.  RTC in 09/2016    Return in about 4 months (around 09/23/2016) for IPSS, PSA and exam.  Zara Council, [facility]  [facility] [address] [place], [place] [address] [phone]

## 2016-06-18 ENCOUNTER — Ambulatory Visit (INDEPENDENT_AMBULATORY_CARE_PROVIDER_SITE_OTHER): Payer: Medicare Other | Admitting: Cardiology

## 2016-06-18 ENCOUNTER — Encounter: Payer: Self-pay | Admitting: Cardiology

## 2016-06-18 VITALS — BP 178/100 | HR 77 | Ht 75.0 in | Wt 236.8 lb

## 2016-06-18 DIAGNOSIS — I10 Essential (primary) hypertension: Secondary | ICD-10-CM | POA: Diagnosis not present

## 2016-06-18 DIAGNOSIS — I739 Peripheral vascular disease, unspecified: Secondary | ICD-10-CM

## 2016-06-18 DIAGNOSIS — R6889 Other general symptoms and signs: Secondary | ICD-10-CM

## 2016-06-18 DIAGNOSIS — I482 Chronic atrial fibrillation, unspecified: Secondary | ICD-10-CM

## 2016-06-18 DIAGNOSIS — Z7901 Long term (current) use of anticoagulants: Secondary | ICD-10-CM

## 2016-06-18 MED ORDER — CARVEDILOL 12.5 MG PO TABS: 12.5000 mg | ORAL_TABLET | Freq: Two times a day (BID) | ORAL | 6 refills | Status: DC

## 2016-06-18 NOTE — Assessment & Plan Note (Signed)
Tolerating Xarelto well without complication.

## 2016-06-18 NOTE — Patient Instructions (Signed)
Take Coreg 12.5 mg twice a day   Schedule follow up with Extender in 2 months   Your physician wants you to follow-up in: 6 months with Dr.[name]. You will receive a reminder letter in the mail two months in advance. If you don't receive a letter, please call our office to schedule the follow-up appointment.

## 2016-06-18 NOTE — Assessment & Plan Note (Signed)
No active claudication.

## 2016-06-18 NOTE — Assessment & Plan Note (Signed)
As I was concerned last time, blood pressure is less well controlled according to current reading with the reduced dose of beta blocker. Plan: Increase back to 12.5 mg twice a day of carvedilol. He will monitor blood pressures at home and keep a log. Follow-up with APP or Pharmacist for recheck blood pressure. Follow-up with me in 6 months.

## 2016-06-18 NOTE — Progress Notes (Signed)
PCP: [name], MD  Clinic Note: Chief Complaint  Patient presents with  . Follow-up    chronic Atrial Fibrillation  . Atrial Fibrillation  . Hypertension    HPI: [name] is a 70 y.o. male with a PMH below who presents today for annual f/u Chronic/PAF.  Diagnosis with A. fib in 2005 - combination of A. fib RVR and nonischemic cardiomyopathy. Improved following cardioversion  Cardiac catheterization which showed no evidence of coronary disease.  EF was estimated at roughly 30% with global hypokinesis. -- s/p TEE guided cardioversion --> reverted back to A. fib RVR again after taking cold medication/decongestant.  It would appear that since then he has been in chronic stable atrial fibrillation. He has an irregularly warfarin, and having no difficulties.   His repeat echocardiogram in November 2009 showed an improved EF of greater than 55% with moderately dilated right atrium and ventricle as well as severely dilated left atrium.   He has been rate controlled since.    [name] was last seen on 12/27/2015 --  he notedHis only major symptoms he is noted is that he gets dyspneic climbing stairs and bending over in the morning tying shoes. Otherwise he is very active and exercises almost 6 days a week. He says out of the 365 days last year he worked out 348 days.  Recent Hospitalizations: n/a  Studies Reviewed: none  Interval History: [name] presents today really with no complaints besides getting winded going up stairs or up hill - has to stop & catch his breath.  Otherwise feels well.   He still goes to the [facility] gym about 3-4 days a week and walks on treadmill and does other cardio exercise. Usually doesn't have much the way of any problems when he is actually exercising, but does note the dyspnea when he goes up stairs or up the hill. He had one episode in July where he felt a fluttering in his chest, didn't really notice that was going fast, just that it was  irregular. Otherwise he really doesn't notice any signs or symptoms of being in atrial fibrillation about palpitations standpoint.  He never has any chest tightness or pressure. He denies any rapid or irregular heartbeat sensation. Way of knowing if he is or is not in A. fib.  No PND, orthopnea or edema.  No palpitations, lightheadedness, dizziness, weakness or syncope/near syncope. No TIA/amaurosis fugax symptoms. No melena, hematochezia, hematuria, or epstaxis. No claudication.  ROS: A comprehensive was performed. Review of Systems  Constitutional: Positive for malaise/fatigue (Difficulty with exercise tolerance due to inability to increase heart rate) and weight loss (Intentional).  HENT: Negative for congestion and nosebleeds.   Cardiovascular: Negative for claudication (I don't think the leg weakness sounds like claudication. Continue to follow) and leg swelling.  Gastrointestinal: Negative for blood in stool and melena.  Musculoskeletal:       His legs just feel weak after walking  Neurological: Negative for dizziness and headaches.  Endo/Heme/Allergies: Bruises/bleeds easily.  Psychiatric/Behavioral: Negative for depression and memory loss.  All other systems reviewed and are negative.   Past Medical History:  Diagnosis Date  . BPH with obstruction/lower urinary tract symptoms   . Chronic atrial fibrillation ([place])   . Dislocation of shoulder, anterior, right, closed 07/24/2014  . Dysrhythmia    af  . Echocardiogram abnormal    2009 moderate to severely dilated left atrium  . Erectile dysfunction   . Gout   . History of chicken pox   .  History of measles   . Hyperglycemia   . Hypertension   . Hypogonadism in male   . Nocturia   . Peyronie's disease   . Traumatic tear of right rotator cuff 07/24/2014  . Venous stasis    chronic    Past Surgical History:  Procedure Laterality Date  . CARDIAC CATHETERIZATION    . CARDIAC CATHETERIZATION  03/28/2004   normal  coronaries, reduced EF at 25-30% (Dr. Gerrie Nordmann)  . CARDIOVERSION, TEE guided  03/29/2004   Dr. Gerrie Nordmann   . COLONOSCOPY  05/28/2013   Dr. Rayann Heman  . HERNIA REPAIR  age 73  . KNEE SURGERY    . LUMBAR FUSION  age 67  . MYOVIEW CARDIOVASCULAR STRESS TEST  07/2003   anterior wall thinning, LV systolic function depressed at 33%  . PILONIDAL CYST EXCISION    . Right Eyebrow Tumor Removed  age 81   . SHOULDER ARTHROSCOPY WITH ROTATOR CUFF REPAIR AND SUBACROMIAL DECOMPRESSION Right 08/19/2014   Procedure: SHOULDER ARTHROSCOPY WITH ROTATOR CUFF REPAIR AND SUBACROMIAL DECOMPRESSION;  Surgeon: Lorn Junes, MD;  Location: [facility];  Service: Orthopedics;  Laterality: Right;  . TOTAL HIP ARTHROPLASTY Right 03/25/2006   Dr. Wanda Plump. Aluisio  . TRANSTHORACIC ECHOCARDIOGRAM  08/2008   EF=>55%, mild-mod dilated; LA mod-severely dilated; RA mild-mod dilated; mild calcif of MV, borderline MVP, trace MR; trace TR; mild pulm valve regurg    Prior to Admission medications   Medication Sig Start Date End Date Taking? Authorizing Provider  benazepril (LOTENSIN) 20 MG tablet take 1 tablet by mouth twice a day 01/31/15  Yes Leonie Man, MD  carvedilol (COREG) 12.5 MG tablet Take 1 tablet (12.5 mg total) by mouth 2 (two) times daily with a meal. 12/27/14  Yes Leonie Man, MD  hydrochlorothiazide (HYDRODIURIL) 25 MG tablet take 1 tablet by mouth once daily 01/17/15  Yes Leonie Man, MD  Multiple Vitamin (MULTIVITAMIN) tablet Take 1 tablet by mouth daily.     Yes Historical Provider, MD  warfarin (COUMADIN) 6 MG tablet take 1 tablet by mouth once daily or as directed 06/22/15  Yes Leonie Man, MD   No Known Allergies   Social History   Social History  . Marital status: Single    Spouse name: [name]  . Number of children: 1  . Years of education: [name]   Occupational History  . retired Pharmacist, hospital    Social History Main Topics  . Smoking status: Never Smoker  . Smokeless tobacco: Never  Used  . Alcohol use 0.0 oz/week     Comment: 2-3 beers most days  . Drug use: No  . Sexual activity: Not Asked   Other Topics Concern  . None   Social History Narrative   Divorced father of 1. Retired Education officer, museum.   ~2 beers / day. Does not smoke.   Works out 6 days a weeks - cardio & weights. He used to run and play basketball prior to his hip surgery in 2007.   Family History  Problem Relation Age of Onset  . Hypertension Father   . Prostate cancer Father   . Bladder Cancer Father   . Hypertension Mother   . Stroke Maternal Grandfather   . Diabetes Paternal Grandfather   . Supraventricular tachycardia Brother   . Arthritis Sister     RA  . Diabetes Maternal Uncle     TYPE 2  . Supraventricular tachycardia Child   . Kidney disease Neg Hx   .  Heart attack Neg Hx   . Colon cancer Neg Hx     Wt Readings from Last 3 Encounters:  06/18/16 236 lb 12.8 oz (107.4 kg)  05/24/16 245 lb 11.2 oz (111.4 kg)  12/07/15 236 lb (107 kg)    PHYSICAL EXAM BP (!) 178/100 (BP Location: Left Arm, Patient Position: Sitting, Cuff Size: Large)   Pulse 77   Ht '6\' 3"'$  (1.905 m)   Wt 236 lb 12.8 oz (107.4 kg)   SpO2 97%   BMI 29.60 kg/m  - 160/90 mmHg on recheck General:, he is a very pleasant, healthy-appearing gentleman; well-groomed, well-nourished, in no acute distress. He is deeply tanned, A&O x3, answers questions appropriately. Pleasant mood & affect HEENT: NCAT. EOMI. MMM. Anicteric sclerae.  Neck: Supple. No LAN, JVD or carotid bruit.  Lungs: CTAB. Nonlabored. Normal effort. Good air movement.  Heart: Irregularly irregular. Normal S1, S2. No M/R/G. Nondisplaced PMI. I do not hear a right-sided carotid bruit that Dr.Little heard.  Abdomen is soft, NT, ND. NABS. No HSM.  Extremities: No C/C, but trace edema on the left side. Neuro: CN II-XII grossly intact.  Non-focal.    Adult ECG Report Atrial fibrillation, rate 77 BPM. LAFB (-48). Cannot rule out anterior infarct, age  undetermined. No significant change besides axis change from last EKG.   Other studies Reviewed: Additional studies/ records that were reviewed today include:  Recent Labs:   Lab Results  Component Value Date   CHOL 191 09/26/2015   HDL 74 09/26/2015   LDLCALC 102 (H) 09/26/2015   TRIG 76 09/26/2015   CHOLHDL 2.6 09/26/2015     ASSESSMENT / PLAN: Problem List Items Addressed This Visit    Peripheral vascular disease ([place]) (Chronic)    No active claudication.      Relevant Medications   carvedilol (COREG) 12.5 MG tablet   Long term current use of anticoagulant therapy    Tolerating Xarelto well without complication.      Exercise intolerance    He does fine with routine activity, but notices problems going upstairs. I was concerned micropuncture Kit competence, but he really did not notice a change with his reduce carvedilol dose. Will return to prior dose.      Essential hypertension - Primary (Chronic)    As I was concerned last time, blood pressure is less well controlled according to current reading with the reduced dose of beta blocker. Plan: Increase back to 12.5 mg twice a day of carvedilol. He will monitor blood pressures at home and keep a log. Follow-up with APP or Pharmacist for recheck blood pressure. Follow-up with me in 6 months.      Relevant Medications   carvedilol (COREG) 12.5 MG tablet   Other Relevant Orders   EKG 12-Lead   Chronic atrial fibrillation ([facility]) - CHA2DS2-VASc Score 3; On Xarelto (Chronic)    Still essentially asymptomatic besides maybe some chronotropic incompetence with his exertional dyspnea going up stairs. Despite this, he really denies any change with having reduced his beta blocker dose. We will increase his beta blocker back to 12.5 twice a day. Tolerating Xarelto without any complications.      Relevant Medications   carvedilol (COREG) 12.5 MG tablet    Other Visit Diagnoses   None.     Current medicines are reviewed at  length with the patient today. (+/- concerns) try switch from warfarin to Xarelto; unable to increase heart rate greater than 40 with exercise The following changes have been made:  Increase Carvedilol 12.5 mg tablet twice a day.  May take a extra 1/2 tablet if has fast heart beat  F/u 2-3 months with APP or Pharm-D for BP recehck  Your physician wants you to follow-up in: 6 months.   Studies Ordered:   Orders Placed This Encounter  Procedures  . EKG 12-Lead      Glenetta Hew, M.D., M.S. Interventional Cardiologist   Pager # [phone] Phone # [phone] [address] [place], [place] [address]

## 2016-06-18 NOTE — Assessment & Plan Note (Signed)
Still essentially asymptomatic besides maybe some chronotropic incompetence with his exertional dyspnea going up stairs. Despite this, he really denies any change with having reduced his beta blocker dose. We will increase his beta blocker back to 12.5 twice a day. Tolerating Xarelto without any complications.

## 2016-06-18 NOTE — Assessment & Plan Note (Signed)
He does fine with routine activity, but notices problems going upstairs. I was concerned micropuncture Kit competence, but he really did not notice a change with his reduce carvedilol dose. Will return to prior dose.

## 2016-07-09 ENCOUNTER — Other Ambulatory Visit: Payer: Self-pay | Admitting: Cardiology

## 2016-07-24 ENCOUNTER — Telehealth: Payer: Self-pay | Admitting: Family Medicine

## 2016-07-24 NOTE — Telephone Encounter (Signed)
Pt called back, awv scheduled - knb

## 2016-07-24 NOTE — Telephone Encounter (Signed)
Called Pt to schedule AWV with [facility] after CPE with PCP for 12/20- knb

## 2016-07-30 ENCOUNTER — Other Ambulatory Visit: Payer: Self-pay | Admitting: Cardiology

## 2016-07-30 NOTE — Telephone Encounter (Signed)
Rx has been sent to the pharmacy electronically. ° °

## 2016-08-14 ENCOUNTER — Encounter: Payer: Self-pay | Admitting: Cardiology

## 2016-08-21 ENCOUNTER — Ambulatory Visit (INDEPENDENT_AMBULATORY_CARE_PROVIDER_SITE_OTHER): Payer: Medicare Other | Admitting: Cardiology

## 2016-08-21 ENCOUNTER — Encounter: Payer: Self-pay | Admitting: Cardiology

## 2016-08-21 VITALS — BP 152/98 | HR 86 | Ht 75.0 in | Wt 242.4 lb

## 2016-08-21 DIAGNOSIS — I482 Chronic atrial fibrillation, unspecified: Secondary | ICD-10-CM

## 2016-08-21 DIAGNOSIS — Z7901 Long term (current) use of anticoagulants: Secondary | ICD-10-CM

## 2016-08-21 DIAGNOSIS — I10 Essential (primary) hypertension: Secondary | ICD-10-CM

## 2016-08-21 DIAGNOSIS — I428 Other cardiomyopathies: Secondary | ICD-10-CM

## 2016-08-21 NOTE — Assessment & Plan Note (Signed)
Initially noted in 2005 with AF, this later resolved by echo in 2009.

## 2016-08-21 NOTE — Assessment & Plan Note (Signed)
B/P 142/ 92 by me with large cuff

## 2016-08-21 NOTE — Progress Notes (Signed)
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 He did have a NICM then but this later resolved by echo 2009. He was cardioverted at one point but reverted to CAF with CVR and no symptoms except dyspnea when using stairs. He works out at [employer], elliptical, treadmill, and wgts. He admits he snores but denies any trouble sleeping.    Current Outpatient Prescriptions  Medication Sig Dispense Refill  . benazepril (LOTENSIN) 20 MG tablet Take 1 tablet (20 mg total) by mouth 2 (two) times daily. 60 tablet 5  . carvedilol (COREG) 12.5 MG tablet Take 1 tablet (12.5 mg total) by mouth 2 (two) times daily. 60 tablet 6  . hydrochlorothiazide (HYDRODIURIL) 25 MG tablet take 1 tablet by mouth once daily 30 tablet 5  . Multiple Vitamin (MULTIVITAMIN) tablet Take 1 tablet by mouth daily.      Alveda Reasons 20 MG TABS tablet take 1 tablet by mouth once daily with SUPPER 30 tablet 6   No current facility-administered medications for this visit.     No Known Allergies  Social History   Social History  . Marital status: Single    Spouse name: [name]  . Number of children: 1  . Years of education: [name]   Occupational History  . retired Pharmacist, hospital    Social History Main Topics  . Smoking status: Never Smoker  . Smokeless tobacco: Never Used  . Alcohol use 0.0 oz/week     Comment: 2-3 beers most days  . Drug use: No  . Sexual activity: Not on file   Other Topics Concern  . Not on file   Social History Narrative   Divorced father of 1. Retired Education officer, museum.   ~2 beers / day. Does not smoke.   Works out 6 days a weeks - cardio & weights. He used to run and play basketball prior to his hip surgery in 2007.     Review of Systems: General: negative for chills, fever, night sweats or weight changes.  Cardiovascular:  negative for chest pain, dyspnea on exertion, edema, orthopnea, palpitations, paroxysmal nocturnal dyspnea or shortness of breath Dermatological: negative for rash Respiratory: negative for cough or wheezing Urologic: negative for hematuria Abdominal: negative for nausea, vomiting, diarrhea, bright red blood per rectum, melena, or hematemesis Neurologic: negative for visual changes, syncope, or dizziness All other systems reviewed and are otherwise negative except as noted above.    Blood pressure (!) 152/98, pulse 86, height 6\' 3"  (1.905 m), weight 242 lb 6.4 oz (110 kg).  General appearance: alert, cooperative, no distress and moderately obese Neck: no JVD Cardiac: irreg irreg Lungs: clear to auscultation bilaterally Extremities: extremities normal, atraumatic, no cyanosis or edema Skin: Skin color, texture, turgor normal. No rashes or lesions Neurologic: Grossly normal  EKG Sept 2017-AF with LAFB  ASSESSMENT AND PLAN:   NICM (nonischemic cardiomyopathy) ([place]) Initially noted in 2005 with AF, this later resolved by echo in 2009.  Chronic atrial fibrillation (HCC) - CHA2DS2-VASc Score 3; On Xarelto No symptoms from this except some dyspnea when using stairs  Long term current use of anticoagulant therapy Xarelto  Essential hypertension B/P 142/ 92 by me with large cuff   PLAN  He seemed reluctant to consider adjustment in his B/P medications at this time. He says his B/P at home  runs 120/85. We discussed the new guidelines that were noted in the paper this am. I suggested he check his B/P 3 times a week and if it is running consistently > 120/80 we should make an adjustment-I would add Amlodipine 5 mg to start. He has a follow up with his PCP in Dec and he'll discuss it with him at that time. I did approach the subject of sleep apnea with him and if we can't get his pressure under control I think we should consider this possibility as well. He would like to f/u with Dr Ellyn Hack in  Feb and this will be arranged.   [name] PA-C 08/21/2016 10:09 AM

## 2016-08-21 NOTE — Assessment & Plan Note (Signed)
No symptoms from this except some dyspnea when using stairs

## 2016-08-21 NOTE — Assessment & Plan Note (Signed)
Xarelto

## 2016-08-21 NOTE — Patient Instructions (Signed)
Medication Instructions:  Your physician recommends that you continue on your current medications as directed. Please refer to the Current Medication list given to you today.  Labwork: None   Testing/Procedures: None   Follow-Up: Your physician wants you to follow-up in: February 2018 WITH DR [facility]. You will receive a reminder letter in the mail two months in advance. If you don't receive a letter, please call our office to schedule the follow-up appointment.  Any Other Special Instructions Will Be Listed Below (If Applicable).  MONITOR YOUR BLOOD PRESSURE AS DISCUSSED TODAY WITH LUKE    If you need a refill on your cardiac medications before your next appointment, please call your pharmacy.

## 2016-08-28 ENCOUNTER — Telehealth: Payer: Self-pay | Admitting: Cardiology

## 2016-08-28 NOTE — Telephone Encounter (Signed)
I spoke to patient. He notes he was seen by Lurena Joiner 1 week ago, and advised to continue monitoring BP at home. He does not have a home cuff - did not express interest in following at home daily at this time, but he does go to the fire dept about 1 time weekly to have his BP taken.  He notes his BP is elevated, 176/92. He denies symptoms today. Was on his way to the gym this morning and got this checked on his way. He notes 160-170/90 typical for him since medication changes. He does not recall which medications were changed, but states his BP used to be better controlled, 140/80 or below, prior to changes.  I informed patient I would be happy to have this reviewed by Lurena Joiner - patient asked that Dr. Ellyn Hack review and give recommendations instead. If Dr. Ellyn Hack advises to defer to pharmD for BP management I will follow up w them.

## 2016-08-28 NOTE — Telephone Encounter (Signed)
New message      Pt c/o BP issue: STAT if pt c/o blurred vision, one-sided weakness or slurred speech  1. What are your last 5 BP readings? 176/92 today at fire dept 2. Are you having any other symptoms (ex. Dizziness, headache, blurred vision, passed out)?  no 3. What is your BP issue? Pt was instructed to have bp checked and call with results.  Pt states that the doctor may adjust his bp meds based on reading.  Please advise

## 2016-08-30 NOTE — Telephone Encounter (Signed)
This is confusing to me. The only changes we made were in February had backed down his carvedilol because he was having some chronotropic incompetence symptoms. I then increased it back to 12.5 mg from 6.25 last time I saw him. We did also temporarily held his HCTZ which she is now back on.  I have not changed any of the medicines for blood pressure nor did Luke. He was reluctant to change anything last time seen. It probably just simply means that he is not responding as well to the blood pressure medications. At this point, I think perhaps the next time he comes in for his warfarin visit we can potentially see if he can also be evaluated for adjusting his blood pressure medications in [facility].  His blood pressure was fine in February, I backed off was carvedilol at that time for chronotropic incompetence. 1 was high in September I increased it back. In the interim HCTZ Evans stopped and restarted. Nothing else is changed. I suspect are probably need another medication.  Glenetta Hew, MD

## 2016-09-02 ENCOUNTER — Other Ambulatory Visit: Payer: Self-pay | Admitting: Cardiology

## 2016-09-03 NOTE — Telephone Encounter (Signed)
REFILL 

## 2016-09-03 NOTE — Telephone Encounter (Signed)
Spoke to patient and gave advice on med mgmt visit w pharmD. He voiced reluctance on these recommendations, wanted to make sure this was advised by Dr. Ellyn Hack but did agree to be seen. Aware of upcoming appt time (Dec 5th 9:30a) but to call again if new concerns in interim. I have also made him aware of recommendation to bring current medication list and home readings. He notes he does not have BP cuff at home. I did tell him he will probably be given recommendation to purchase home cuff for daily monitoring.

## 2016-09-03 NOTE — Telephone Encounter (Signed)
Pt now on Xarelto, not followed in coumadin clinic. Will contact for option to see Erasmo Downer for BP management.

## 2016-09-03 NOTE — Telephone Encounter (Signed)
Noted.  Will see pt on Tues Dec 5

## 2016-09-05 ENCOUNTER — Encounter: Payer: Self-pay | Admitting: Urology

## 2016-09-05 ENCOUNTER — Ambulatory Visit: Payer: Medicare Other | Admitting: Urology

## 2016-09-05 VITALS — BP 169/91 | HR 79 | Ht 75.0 in | Wt 240.5 lb

## 2016-09-05 DIAGNOSIS — T839XXA Unspecified complication of genitourinary prosthetic device, implant and graft, initial encounter: Secondary | ICD-10-CM

## 2016-09-05 NOTE — Progress Notes (Signed)
09/05/2016 1:53 PM   [name] 05-18-1946 [id number]  Referring provider: Birdie Sons, MD [address] [place], [place] [address]  Chief Complaint  Patient presents with  . Erectile Dysfunction    problem with prosthesis    HPI: Patient is a 70 year old Caucasian male who presents today for an urgent appointment regarding his penile prosthesis malfunctioning.    He states about one month ago he started to notice a bend in his penis while attempting intercourse.  He has also noticed more and more difficulty with inflating his prosthesis.  The pump mechanism will sometime not even depress.  Over the weekend, his prothesis completely deflated.    He is not having difficulty with urination.  He has not experienced any pain with the prosthesis.  He is not having dysuria, gross hematuria or suprapubic pain.  He has not had fevers, chills, nausea or vomiting.     PMH: Past Medical History:  Diagnosis Date  . BPH with obstruction/lower urinary tract symptoms   . Chronic atrial fibrillation ([place])   . Dislocation of shoulder, anterior, right, closed 07/24/2014  . Echocardiogram abnormal    2009 moderate to severely dilated left atrium  . Erectile dysfunction   . Essential hypertension   . Gout   . History of chicken pox   . History of measles   . Hyperglycemia   . Hypogonadism in male   . Nocturia   . Peyronie's disease   . Traumatic tear of right rotator cuff 07/24/2014  . Venous stasis    chronic    Surgical History: Past Surgical History:  Procedure Laterality Date  . CARDIAC CATHETERIZATION    . CARDIAC CATHETERIZATION  03/28/2004   normal coronaries, reduced EF at 25-30% (Dr. Gerrie Nordmann)  . CARDIOVERSION, TEE guided  03/29/2004   Dr. Gerrie Nordmann   . COLONOSCOPY  05/28/2013   Dr. Rayann Heman  . HERNIA REPAIR  age 89  . KNEE SURGERY    . LUMBAR FUSION  age 69  . MYOVIEW CARDIOVASCULAR STRESS TEST  07/2003   anterior wall thinning, LV systolic  function depressed at 33%  . PILONIDAL CYST EXCISION    . Right Eyebrow Tumor Removed  age 80   . SHOULDER ARTHROSCOPY WITH ROTATOR CUFF REPAIR AND SUBACROMIAL DECOMPRESSION Right 08/19/2014   Procedure: SHOULDER ARTHROSCOPY WITH ROTATOR CUFF REPAIR AND SUBACROMIAL DECOMPRESSION;  Surgeon: Lorn Junes, MD;  Location: [facility];  Service: Orthopedics;  Laterality: Right;  . TOTAL HIP ARTHROPLASTY Right 03/25/2006   Dr. Wanda Plump. [place]  . TRANSTHORACIC ECHOCARDIOGRAM  08/2008   EF=>55%, mild-mod dilated; LA mod-severely dilated; RA mild-mod dilated; mild calcif of MV, borderline MVP, trace MR; trace TR; mild pulm valve regurg     Home Medications:    Medication List       Accurate as of 09/05/16  1:53 PM. Always use your most recent med list.          benazepril 20 MG tablet Commonly known as:  LOTENSIN take 1 tablet by mouth twice a day   carvedilol 12.5 MG tablet Commonly known as:  COREG Take 1 tablet (12.5 mg total) by mouth 2 (two) times daily.   hydrochlorothiazide 25 MG tablet Commonly known as:  HYDRODIURIL take 1 tablet by mouth once daily   multivitamin tablet Take 1 tablet by mouth daily.   XARELTO 20 MG Tabs tablet Generic drug:  rivaroxaban take 1 tablet by mouth once daily with  SUPPER       Allergies: No Known Allergies  Family History: Family History  Problem Relation Age of Onset  . Hypertension Father   . Prostate cancer Father   . Bladder Cancer Father   . Hypertension Mother   . Stroke Maternal Grandfather   . Diabetes Paternal Grandfather   . Supraventricular tachycardia Brother   . Arthritis Sister     RA  . Diabetes Maternal Uncle     TYPE 2  . Supraventricular tachycardia Child   . Kidney disease Neg Hx   . Heart attack Neg Hx   . Colon cancer Neg Hx     Social History:  reports that he has never smoked. He has never used smokeless tobacco. He reports that he drinks alcohol. He reports that he does not use  drugs.  ROS: UROLOGY Frequent Urination?: No Hard to postpone urination?: No Burning/pain with urination?: No Get up at night to urinate?: No Leakage of urine?: No Urine stream starts and stops?: No Trouble starting stream?: No Do you have to strain to urinate?: No Blood in urine?: No Urinary tract infection?: No Sexually transmitted disease?: No Injury to kidneys or bladder?: No Painful intercourse?: No Weak stream?: No Erection problems?: Yes Penile pain?: No  Gastrointestinal Nausea?: No Vomiting?: No Indigestion/heartburn?: No Diarrhea?: No Constipation?: No  Constitutional Fever: No Night sweats?: No Weight loss?: No Fatigue?: No  Skin Skin rash/lesions?: No Itching?: No  Eyes Blurred vision?: No Double vision?: No  Ears/Nose/Throat Sore throat?: No Sinus problems?: No  Hematologic/Lymphatic Swollen glands?: No Easy bruising?: No  Cardiovascular Leg swelling?: No Chest pain?: No  Respiratory Cough?: No Shortness of breath?: No  Endocrine Excessive thirst?: No  Musculoskeletal Back pain?: No Joint pain?: No  Neurological Headaches?: No Dizziness?: No  Psychologic Depression?: No Anxiety?: No  Physical Exam: BP (!) 169/91   Pulse 79   Ht 6\' 3"  (1.905 m)   Wt 240 lb 8 oz (109.1 kg)   BMI 30.06 kg/m   Constitutional: Well nourished. Alert and oriented, No acute distress. HEENT: [place] AT, moist mucus membranes. Trachea midline, no masses. Cardiovascular: No clubbing, cyanosis, or edema. Respiratory: Normal respiratory effort, no increased work of breathing. GI: Abdomen is soft, non tender, non distended, no abdominal masses. Liver and spleen not palpable.  No hernias appreciated.  Stool sample for occult testing is not indicated.   GU: GU: No CVA tenderness.  No bladder fullness or masses.  Patient with circumcised phallus.  Urethral meatus is patent.  No penile discharge. No penile lesions or rashes.  Prothesis cylinder palpated  bilateral.  Scrotum without lesions, cysts, rashes and/or edema.  Testicles are located scrotally bilaterally. No masses are appreciated in the testicles. Prothesis pump located in the right scrotum.  Left and right epididymis are normal. Rectal: Deferred. Skin: No rashes, bruises or suspicious lesions. Lymph: No cervical or inguinal adenopathy. Neurologic: Grossly intact, no focal deficits, moving all 4 extremities. Psychiatric: Normal mood and affect.  Laboratory Data: Lab Results  Component Value Date   WBC 5.0 12/14/2015   HGB 14.7 12/14/2015   HCT 42.8 12/14/2015   MCV 95.7 12/14/2015   PLT 326 12/14/2015    Lab Results  Component Value Date   CREATININE 1.01 09/26/2015    Lab Results  Component Value Date   HGBA1C 5.6 09/20/2014       Component Value Date/Time   CHOL 191 09/26/2015 1118   HDL 74 09/26/2015 1118   CHOLHDL 2.6 09/26/2015 1118  LDLCALC 102 (H) 09/26/2015 1118      Assessment & Plan:    1. Problems with penile prosthesis  - patient's pump is cycled by Dr. Erlene Quan and was found to be functioning appropriately  - patient reassured    No Follow-up on file.  These notes generated with voice recognition software. I apologize for typographical errors.  Zara Council, [facility] [address], [place], [place] [address] [phone]

## 2016-09-06 ENCOUNTER — Telehealth: Payer: Self-pay | Admitting: Family Medicine

## 2016-09-06 NOTE — Telephone Encounter (Signed)
Called Pt to re-schedule AWV with [facility] Alyson Ingles is not in the office on 12/20- knb

## 2016-09-11 ENCOUNTER — Ambulatory Visit (INDEPENDENT_AMBULATORY_CARE_PROVIDER_SITE_OTHER): Payer: Medicare Other | Admitting: Pharmacist Clinician (PhC)/ Clinical Pharmacy Specialist

## 2016-09-11 DIAGNOSIS — I10 Essential (primary) hypertension: Secondary | ICD-10-CM

## 2016-09-11 MED ORDER — CHLORTHALIDONE 25 MG PO TABS: 25.0000 mg | ORAL_TABLET | Freq: Every day | ORAL | 3 refills | Status: DC

## 2016-09-11 NOTE — Assessment & Plan Note (Signed)
Patient with elevated BP, still somewhat reluctant to add medication.  For today, will switch him from hctz to chlorthalidone 25 mg daily.  He is to come back in 6 weeks and at that time will need to consider adding amlodipine should he not be to goal.  Patient agreeable to plan at this time.  He will continue with his weekly checks at the local fire department and bring those readings to his next appt.

## 2016-09-11 NOTE — Patient Instructions (Signed)
Return for a a follow up appointment in 1 month  Your blood pressure today is 156/92  (goal is < 130/80)  Check your blood pressure at home weekly at the fire department and keep record of the readings.  Take your BP meds as follows:  Continue with carvedilol and benazepril  Start chlorthalidone 25 mg once daily in the mornings.  Stop hydrochlorothiazide (HCTZ) 25 mg for now  Bring all of your meds, your BP cuff and your record of home blood pressures to your next appointment.  Exercise as you're able, try to walk approximately 30 minutes per day.  Keep salt intake to a minimum, especially watch canned and prepared boxed foods.  Eat more fresh fruits and vegetables and fewer canned items.  Avoid eating in fast food restaurants.    HOW TO TAKE YOUR BLOOD PRESSURE: . Rest 5 minutes before taking your blood pressure. .  Don't smoke or drink caffeinated beverages for at least 30 minutes before. . Take your blood pressure before (not after) you eat. . Sit comfortably with your back supported and both feet on the floor (don't cross your legs). . Elevate your arm to heart level on a table or a desk. . Use the proper sized cuff. It should fit smoothly and snugly around your bare upper arm. There should be enough room to slip a fingertip under the cuff. The bottom edge of the cuff should be 1 inch above the crease of the elbow. . Ideally, take 3 measurements at one sitting and record the average.

## 2016-09-11 NOTE — Progress Notes (Signed)
09/11/2016 [name] 1946/01/02 [id number]   HPI:  [name] is a 70 y.o. male patient of Dr Ellyn Hack, with a PMH below who presents today for hypertension clinic evaluation.  He has had hypertension, well controlled for many years, however in the past few months has noted an increase.  He has had no changes in diet or lifestyle which could explain this.  He saw [name] on Nov 14, with a pressure of , but was reluctant to change or add any medications at that time, stating home readings were always 120/85 range.  He mentioned that he snored and Lurena Joiner suggested the possibility of sleep study if we have difficulty getting his BP under control.   Today he says that his pressure was good at urologist, however the computer records that BP at 169/91.    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  Intolerances:   None  Wt Readings from Last 3 Encounters:  09/05/16 240 lb 8 oz (109.1 kg)  08/21/16 242 lb 6.4 oz (110 kg)  06/18/16 236 lb 12.8 oz (107.4 kg)   BP Readings from Last 3 Encounters:  09/11/16 (!) 156/92  09/05/16 (!) 169/91  08/21/16 (!) 152/98   Pulse Readings from Last 3 Encounters:  09/11/16 80  09/05/16 79  08/21/16 86    Current Outpatient  Prescriptions  Medication Sig Dispense Refill  . benazepril (LOTENSIN) 20 MG tablet take 1 tablet by mouth twice a day 60 tablet 2  . carvedilol (COREG) 12.5 MG tablet Take 1 tablet (12.5 mg total) by mouth 2 (two) times daily. 60 tablet 6  . chlorthalidone (HYGROTON) 25 MG tablet Take 1 tablet (25 mg total) by mouth daily. 30 tablet 3  . Multiple Vitamin (MULTIVITAMIN) tablet Take 1 tablet by mouth daily.      Alveda Reasons 20 MG TABS tablet take 1 tablet by mouth once daily with SUPPER 30 tablet 6   No current facility-administered medications for this visit.     No Known Allergies  Past Medical History:  Diagnosis Date  . BPH with obstruction/lower urinary tract symptoms   . Chronic atrial fibrillation ([place])   . Dislocation of shoulder, anterior, right, closed 07/24/2014  . Echocardiogram abnormal    2009 moderate to severely dilated left atrium  . Erectile dysfunction   . Essential hypertension   . Gout   . History of chicken pox   . History of measles   . Hyperglycemia   . Hypogonadism in male   . Nocturia   . Peyronie's disease   . Traumatic tear of right rotator cuff 07/24/2014  . Venous stasis    chronic    Blood pressure (!) 156/92, pulse 80.  No problem-specific Assessment & Plan notes found for this encounter.   Tommy Medal PharmD CPP [facility]

## 2016-09-18 ENCOUNTER — Other Ambulatory Visit: Payer: Self-pay

## 2016-09-18 DIAGNOSIS — N401 Enlarged prostate with lower urinary tract symptoms: Secondary | ICD-10-CM

## 2016-09-20 ENCOUNTER — Other Ambulatory Visit: Payer: Medicare Other

## 2016-09-20 DIAGNOSIS — N401 Enlarged prostate with lower urinary tract symptoms: Secondary | ICD-10-CM

## 2016-09-21 LAB — PSA: PROSTATE SPECIFIC AG, SERUM: 0.7 ng/mL (ref 0.0–4.0)

## 2016-09-24 NOTE — Progress Notes (Signed)
09/25/2016 10:17 AM   [name] 1945/10/20 [id number]  Referring provider: Birdie Sons, MD [address] [place], [place] [address]  Chief Complaint  Patient presents with  . Erectile Dysfunction    1 month follow up defective penile prosthesis    HPI: Patient is a 70 year old Caucasian male with a history of erectile dysfunction (s/p penile prothesis placement on 12/21/2013) and BPH with LUTS who presents for his yearly appointment.    Erectile dysfunction Patient was seen three weeks ago with questions how his penile prosthesis was functioning.   On exam in the office, it was found that his prosthesis was functioning normally.   He has not had difficulty with his prosthesis since his visit with [place] three weeks ago.    BPH WITH LUTS His IPSS score today is 8, which is moderate lower urinary tract symptomatology. He is mostly satisfied with his quality life due to his urinary symptoms.   His major complaints today are frequency, intermittency and nocturia x 2.  He has had these symptoms for several years.  He does not find them bothersome.  He denies any dysuria, hematuria or suprapubic pain.  He also denies any recent fevers, chills, nausea or vomiting.  His father was diagnosed with prostate cancer.      IPSS    Row Name 09/25/16 1000         International Prostate Symptom Score   How often have you had the sensation of not emptying your bladder? Less than 1 in 5     How often have you had to urinate less than every two hours? Less than half the time     How often have you found you stopped and started again several times when you urinated? Less than half the time     How often have you found it difficult to postpone urination? Less than 1 in 5 times     How often have you had a weak urinary stream? Not at All     How often have you had to strain to start urination? Not at All     How many times did you typically get up at night to urinate? 2 Times     Total IPSS Score 8       Quality of Life due to urinary symptoms   If you were to spend the rest of your life with your urinary condition just the way it is now how would you feel about that? Mostly Satisfied        Score:  1-7 Mild 8-19 Moderate 20-35 Severe    PMH: Past Medical History:  Diagnosis Date  . BPH with obstruction/lower urinary tract symptoms   . Chronic atrial fibrillation ([facility])   . Dislocation of shoulder, anterior, right, closed 07/24/2014  . Echocardiogram abnormal    2009 moderate to severely dilated left atrium  . Erectile dysfunction   . Essential hypertension   . Gout   . History of chicken pox   . History of measles   . Hyperglycemia   . Hypogonadism in male   . Nocturia   . Peyronie's disease   . Traumatic tear of right rotator cuff 07/24/2014  . Venous stasis    chronic    Surgical History: Past Surgical History:  Procedure Laterality Date  . CARDIAC CATHETERIZATION    . CARDIAC CATHETERIZATION  03/28/2004   normal coronaries, reduced EF at 25-30% (Dr. Gerrie Nordmann)  . CARDIOVERSION, TEE  guided  03/29/2004   Dr. Gerrie Nordmann   . COLONOSCOPY  05/28/2013   Dr. Rayann Heman  . HERNIA REPAIR  age 89  . KNEE SURGERY    . LUMBAR FUSION  age 47  . MYOVIEW CARDIOVASCULAR STRESS TEST  07/2003   anterior wall thinning, LV systolic function depressed at 33%  . PILONIDAL CYST EXCISION    . Right Eyebrow Tumor Removed  age 64   . SHOULDER ARTHROSCOPY WITH ROTATOR CUFF REPAIR AND SUBACROMIAL DECOMPRESSION Right 08/19/2014   Procedure: SHOULDER ARTHROSCOPY WITH ROTATOR CUFF REPAIR AND SUBACROMIAL DECOMPRESSION;  Surgeon: Lorn Junes, MD;  Location: [facility];  Service: Orthopedics;  Laterality: Right;  . TOTAL HIP ARTHROPLASTY Right 03/25/2006   Dr. Wanda Plump. [place]  . TRANSTHORACIC ECHOCARDIOGRAM  08/2008   EF=>55%, mild-mod dilated; LA mod-severely dilated; RA mild-mod dilated; mild calcif of MV, borderline MVP, trace MR; trace TR; mild pulm valve  regurg     Home Medications:  Allergies as of 09/25/2016   No Known Allergies     Medication List       Accurate as of 09/25/16 10:17 AM. Always use your most recent med list.          benazepril 20 MG tablet Commonly known as:  LOTENSIN take 1 tablet by mouth twice a day   carvedilol 12.5 MG tablet Commonly known as:  COREG Take 1 tablet (12.5 mg total) by mouth 2 (two) times daily.   chlorthalidone 25 MG tablet Commonly known as:  HYGROTON Take 1 tablet (25 mg total) by mouth daily.   multivitamin tablet Take 1 tablet by mouth daily.   XARELTO 20 MG Tabs tablet Generic drug:  rivaroxaban take 1 tablet by mouth once daily with SUPPER       Allergies: No Known Allergies  Family History: Family History  Problem Relation Age of Onset  . Hypertension Father   . Prostate cancer Father   . Bladder Cancer Father   . Hypertension Mother   . Stroke Maternal Grandfather   . Diabetes Paternal Grandfather   . Supraventricular tachycardia Brother   . Arthritis Sister     RA  . Diabetes Maternal Uncle     TYPE 2  . Supraventricular tachycardia Child   . Kidney disease Neg Hx   . Heart attack Neg Hx   . Colon cancer Neg Hx     Social History:  reports that he has never smoked. He has never used smokeless tobacco. He reports that he drinks alcohol. He reports that he does not use drugs.  ROS: UROLOGY Frequent Urination?: No Hard to postpone urination?: No Burning/pain with urination?: No Get up at night to urinate?: No Leakage of urine?: No Urine stream starts and stops?: No Trouble starting stream?: No Do you have to strain to urinate?: No Blood in urine?: No Urinary tract infection?: No Sexually transmitted disease?: No Injury to kidneys or bladder?: No Painful intercourse?: No Weak stream?: No Erection problems?: No Penile pain?: No  Gastrointestinal Nausea?: No Vomiting?: No Indigestion/heartburn?: No Diarrhea?: No Constipation?:  No  Constitutional Fever: No Night sweats?: No Weight loss?: No Fatigue?: No  Skin Skin rash/lesions?: No Itching?: No  Eyes Blurred vision?: No Double vision?: No  Ears/Nose/Throat Sore throat?: No Sinus problems?: No  Hematologic/Lymphatic Swollen glands?: No Easy bruising?: No  Cardiovascular Leg swelling?: No Chest pain?: No  Respiratory Cough?: No Shortness of breath?: No  Endocrine Excessive thirst?: No  Musculoskeletal Back pain?: No Joint pain?: No  Neurological Headaches?: No Dizziness?: No  Psychologic Depression?: No Anxiety?: No  Physical Exam: BP (!) 152/100   Pulse 80   Ht 6\' 3"  (1.905 m)   Wt 239 lb 8 oz (108.6 kg)   BMI 29.94 kg/m   GU: No CVA tenderness.  No bladder fullness or masses.  Patient with circumcised phallus.  Urethral meatus is patent.  No penile discharge. No penile lesions or rashes.  Prothesis cylinder palpated bilateral.  Scrotum without lesions, cysts, rashes and/or edema.  Testicles are located scrotally bilaterally. No masses are appreciated in the testicles. Prothesis pump located in the right scrotum.  Left and right epididymis are normal. Rectal: Patient with  normal sphincter tone. Anus and perineum without scarring or rashes. No rectal masses are appreciated. Prostate is approximately 50 grams, no nodules are appreciated. Seminal vesicles are normal.  Laboratory Data: Lab Results  Component Value Date   WBC 5.0 12/14/2015   HGB 14.7 12/14/2015   HCT 42.8 12/14/2015   MCV 95.7 12/14/2015   PLT 326 12/14/2015    Lab Results  Component Value Date   CREATININE 1.01 09/26/2015   PSA History  0.7 ng/mL on 09/21/2013  0.5 ng/mL on 02/11/2014  0.7 ng/mL on 09/24/2014  0.6 ng/mL on 09/23/2015  0.7 ng/mL on 09/20/2016  Assessment & Plan:    1. Erectile dysfunction:   Patient is s/p penile prosthesis placement on 12/21/2013.  I His questions are answered.  He is reassured that the prosthesis functioning  properly.    2. BPH with LUTS  - IPSS score is 8/2  - Continue conservative management, avoiding bladder irritants and timed voiding's  - RTC in 12 months for IPSS, PSA and exam    Return in about 1 year (around 09/25/2017) for IPSS, PSA and exam.  Zara Council, [facility]  [place] [address] [place], [place] [address] [phone]

## 2016-09-25 ENCOUNTER — Encounter: Payer: Self-pay | Admitting: Urology

## 2016-09-25 ENCOUNTER — Ambulatory Visit: Payer: Medicare Other | Admitting: Urology

## 2016-09-25 VITALS — BP 152/100 | HR 80 | Ht 75.0 in | Wt 239.5 lb

## 2016-09-25 DIAGNOSIS — N401 Enlarged prostate with lower urinary tract symptoms: Secondary | ICD-10-CM

## 2016-09-25 DIAGNOSIS — N138 Other obstructive and reflux uropathy: Secondary | ICD-10-CM | POA: Diagnosis not present

## 2016-09-25 DIAGNOSIS — N529 Male erectile dysfunction, unspecified: Secondary | ICD-10-CM | POA: Diagnosis not present

## 2016-09-26 ENCOUNTER — Ambulatory Visit (INDEPENDENT_AMBULATORY_CARE_PROVIDER_SITE_OTHER): Payer: Medicare Other | Admitting: Family Medicine

## 2016-09-26 ENCOUNTER — Ambulatory Visit: Payer: Medicare Other

## 2016-09-26 ENCOUNTER — Encounter: Payer: Self-pay | Admitting: Family Medicine

## 2016-09-26 VITALS — BP 154/84 | HR 72 | Temp 98.6°F | Resp 16 | Ht 75.0 in | Wt 244.0 lb

## 2016-09-26 DIAGNOSIS — I10 Essential (primary) hypertension: Secondary | ICD-10-CM

## 2016-09-26 DIAGNOSIS — Z0000 Encounter for general adult medical examination without abnormal findings: Secondary | ICD-10-CM

## 2016-09-26 NOTE — Progress Notes (Signed)
Patient: [name], Male    DOB: 03-08-1946, 70 y.o.   MRN: [id number] Visit Date: 09/26/2016  Today's Provider: [name], MD   Chief Complaint  Patient presents with  . Annual Exam  . Hypertension  . Atrial Fibrillation  . Abdominal Pain   Subjective:    Annual wellness visit [name] is a 70 y.o. male. He feels well. He reports exercising regularly. He reports he is sleeping well.     Hypertension, follow-up:  BP Readings from Last 3 Encounters:  09/26/16 (!) 154/84  09/25/16 (!) 152/100  09/11/16 (!) 156/92    He was last seen for hypertension 1 years ago.  BP at that visit was 140/80. Management since that visit includes no changes. Patient reports that he follows up with cardiology every 6 months. His last visit was 2 weeks ago.  He reports good compliance with treatment. He is not having side effects.  He is exercising. He is adherent to low salt diet.   Outside blood pressures are checked occasionally. Patient denies chest pressure/discomfort, exertional chest pressure/discomfort, lower extremity edema and syncope.   Cardiovascular risk factors include obesity (BMI >= 30 kg/m2).     Weight trend: stable Wt Readings from Last 3 Encounters:  09/26/16 244 lb (110.7 kg)  09/25/16 239 lb 8 oz (108.6 kg)  09/05/16 240 lb 8 oz (109.1 kg)    Current diet: well balanced     Review of Systems  Constitutional: Negative.   HENT: Negative.   Eyes: Negative.  Negative for itching.  Respiratory: Negative.   Cardiovascular: Negative.   Gastrointestinal: Positive for abdominal pain.  Endocrine: Negative.   Genitourinary: Negative.   Musculoskeletal: Negative.   Skin: Negative.   Allergic/Immunologic: Negative.   Neurological: Negative.   Hematological: Negative.   Psychiatric/Behavioral: Negative.     Social History   Social History  . Marital status: Single    Spouse name: [name]  . Number of children: 1  . Years of education:  [name]   Occupational History  . retired Pharmacist, hospital    Social History Main Topics  . Smoking status: Never Smoker  . Smokeless tobacco: Never Used  . Alcohol use 0.0 oz/week     Comment: 2-3 beers most days  . Drug use: No  . Sexual activity: Not on file   Other Topics Concern  . Not on file   Social History Narrative   Divorced father of 1. Retired Education officer, museum.   ~2 beers / day. Does not smoke.   Works out 6 days a weeks - cardio & weights. He used to run and play basketball prior to his hip surgery in 2007.    Past Medical History:  Diagnosis Date  . BPH with obstruction/lower urinary tract symptoms   . Chronic atrial fibrillation ([facility])   . Dislocation of shoulder, anterior, right, closed 07/24/2014  . Echocardiogram abnormal    2009 moderate to severely dilated left atrium  . Erectile dysfunction   . Essential hypertension   . Gout   . History of chicken pox   . History of measles   . Hyperglycemia   . Hypogonadism in male   . Nocturia   . Peyronie's disease   . Traumatic tear of right rotator cuff 07/24/2014  . Venous stasis    chronic     Patient Active Problem List   Diagnosis Date Noted  . NICM (nonischemic cardiomyopathy) ([facility]) 08/21/2016  . Exercise intolerance 12/09/2015  .  BPH with obstruction/lower urinary tract symptoms 09/27/2015  . Erectile dysfunction of organic origin 09/27/2015  . Arthralgia of right hand 09/23/2015  . Basal cell carcinoma of right lower extremity 03/24/2015  . Hyperglycemia 08/16/2014  . Dislocation of shoulder, anterior, right, closed 07/24/2014  . Traumatic tear of right rotator cuff 07/24/2014  . Venous stasis dermatitis 11/02/2013  . Long term current use of anticoagulant therapy 12/24/2012  . Benign prostatic hypertrophy without urinary obstruction 03/25/2010  . Gouty arthropathy 03/24/2010  . Peripheral vascular disease ([facility]) 12/19/2009  . Essential hypertension 11/18/2009  . Chronic atrial fibrillation ([facility]) -  CHA2DS2-VASc Score 3; On Xarelto 11/18/2009    Class: Chronic    Past Surgical History:  Procedure Laterality Date  . CARDIAC CATHETERIZATION    . CARDIAC CATHETERIZATION  03/28/2004   normal coronaries, reduced EF at 25-30% (Dr. Gerrie Nordmann)  . CARDIOVERSION, TEE guided  03/29/2004   Dr. Gerrie Nordmann   . COLONOSCOPY  05/28/2013   Dr. Rayann Heman  . HERNIA REPAIR  age 78  . KNEE SURGERY    . LUMBAR FUSION  age 89  . MYOVIEW CARDIOVASCULAR STRESS TEST  07/2003   anterior wall thinning, LV systolic function depressed at 33%  . PILONIDAL CYST EXCISION    . Right Eyebrow Tumor Removed  age 61   . SHOULDER ARTHROSCOPY WITH ROTATOR CUFF REPAIR AND SUBACROMIAL DECOMPRESSION Right 08/19/2014   Procedure: SHOULDER ARTHROSCOPY WITH ROTATOR CUFF REPAIR AND SUBACROMIAL DECOMPRESSION;  Surgeon: Lorn Junes, MD;  Location: [facility];  Service: Orthopedics;  Laterality: Right;  . TOTAL HIP ARTHROPLASTY Right 03/25/2006   Dr. Wanda Plump. [place]  . TRANSTHORACIC ECHOCARDIOGRAM  08/2008   EF=>55%, mild-mod dilated; LA mod-severely dilated; RA mild-mod dilated; mild calcif of MV, borderline MVP, trace MR; trace TR; mild pulm valve regurg     His family history includes Arthritis in his sister; Bladder Cancer in his father; Diabetes in his maternal uncle and paternal grandfather; Hypertension in his father and mother; Prostate cancer in his father; Stroke in his maternal grandfather; Supraventricular tachycardia in his brother and child.      Current Outpatient Prescriptions:  .  benazepril (LOTENSIN) 20 MG tablet, take 1 tablet by mouth twice a day, Disp: 60 tablet, Rfl: 2 .  chlorthalidone (HYGROTON) 25 MG tablet, Take 1 tablet (25 mg total) by mouth daily., Disp: 30 tablet, Rfl: 3 .  Multiple Vitamin (MULTIVITAMIN) tablet, Take 1 tablet by mouth daily.  , Disp: , Rfl:  .  XARELTO 20 MG TABS tablet, take 1 tablet by mouth once daily with SUPPER, Disp: 30 tablet, Rfl: 6 .  carvedilol (COREG) 12.5 MG  tablet, Take 1 tablet (12.5 mg total) by mouth 2 (two) times daily., Disp: 60 tablet, Rfl: 6  Patient Care Team: Birdie Sons, MD as PCP - General (Family Medicine) Leonie Man, MD as Consulting Physician (Cardiology) Collier Flowers, MD as Referring Physician (Urology) Elsie Saas, MD as Consulting Physician (Orthopedic Surgery) Kennieth Francois, MD (Dermatology)     Objective:   Vitals: BP (!) 154/84 (BP Location: Right Arm, Patient Position: Sitting, Cuff Size: Large)   Pulse 72 Comment: irregular  Temp 98.6 F (37 C)   Resp 16   Ht 6\' 3"  (1.905 m)   Wt 244 lb (110.7 kg)   BMI 30.50 kg/m   Physical Exam   General Appearance:    Alert, cooperative, no distress, appears stated age  Head:    Normocephalic, without obvious abnormality,  atraumatic  Eyes:    PERRL, conjunctiva/corneas clear, EOM's intact, fundi    benign, both eyes       Ears:    Normal TM's and external ear canals, both ears  Nose:   Nares normal, septum midline, mucosa normal, no drainage   or sinus tenderness  Throat:   Lips, mucosa, and tongue normal; teeth and gums normal  Neck:   Supple, symmetrical, trachea midline, no adenopathy;       thyroid:  No enlargement/tenderness/nodules; no carotid   bruit or JVD  Back:     Symmetric, no curvature, ROM normal, no CVA tenderness  Lungs:     Clear to auscultation bilaterally, respirations unlabored  Chest wall:    No tenderness or deformity  Heart:    Regular rate and rhythm, S1 and S2 normal, no murmur, rub   or gallop  Abdomen:     Soft, non-tender, bowel sounds active all four quadrants,    no masses, no organomegaly  Genitalia:    deferred  Rectal:    deferred  Extremities:   Extremities normal, atraumatic, no cyanosis or edema  Pulses:   2+ and symmetric all extremities  Skin:   Skin color, texture, turgor normal, no rashes or lesions  Lymph nodes:   Cervical, supraclavicular, and axillary nodes normal  Neurologic:   CNII-XII intact. Normal  strength, sensation and reflexes      throughout     Activities of Daily Living In your present state of health, do you have any difficulty performing the following activities: 09/26/2016  Hearing? N  Vision? N  Difficulty concentrating or making decisions? N  Walking or climbing stairs? Y  Dressing or bathing? N  Doing errands, shopping? N  Some recent data might be hidden    Fall Risk Assessment Fall Risk  09/26/2016 09/26/2015  Falls in the past year? No Yes  Number falls in past yr: - 1  Injury with Fall? - No  Follow up - Falls evaluation completed     Depression Screen PHQ 2/9 Scores 09/26/2016 09/26/2015  PHQ - 2 Score 0 0  PHQ- 9 Score - 0    Cognitive Testing - 6-CIT  Correct? Score   What year is it? yes 0 0 or 4  What month is it? yes 0 0 or 3  Memorize:    Pia Mau,  44,  [address]  [place],      What time is it? (within 1 hour) yes 0 0 or 3  Count backwards from 20 yes 0 0, 2, or 4  Name the months of the year yes 0 0, 2, or 4  Repeat name & address above yes 0 0, 2, 4, 6, 8, or 10       TOTAL SCORE  0/28   Interpretation:  Normal  Normal (0-7) Abnormal (8-28)       Assessment & Plan:    Annual Phyaical  Reviewed patient's Family Medical History Reviewed and updated list of patient's medical providers Assessment of cognitive impairment was done Assessed patient's functional ability Established a written schedule for health screening [place] Completed and Reviewed  Exercise Activities and Dietary recommendations Goals    None      Immunization History  Administered Date(s) Administered  . Pneumococcal Conjugate-13 09/20/2014  . Pneumococcal Polysaccharide-23 09/26/2015  . Tdap 03/27/2011  . Zoster 03/24/2010    Health Maintenance  Topic Date Due  . INFLUENZA VACCINE  05/08/2016  . TETANUS/TDAP  03/26/2021  . COLONOSCOPY  05/29/2023  . ZOSTAVAX  Completed  . Hepatitis C Screening  Completed  . PNA  vac Low Risk Adult  Completed     Discussed health benefits of physical activity, and encouraged him to engage in regular exercise appropriate for his age and condition.    ------------------------------------------------------------------------------------------------------------  1. Annual physical exam   2. Essential hypertension BP up today. He states has been elevated since changing hctz to chlorthalidone. He has follow up with cardiology in a few months and will discuss further if BP still elevated.  - Comprehensive metabolic panel - Lipid panel   [name], MD  [facility]

## 2016-09-27 ENCOUNTER — Telehealth: Payer: Self-pay

## 2016-09-27 LAB — LIPID PANEL
CHOL/HDL RATIO: 2.9 ratio (ref 0.0–5.0)
CHOLESTEROL TOTAL: 187 mg/dL (ref 100–199)
HDL: 64 mg/dL (ref >39)
LDL CALC: 99 mg/dL (ref 0–99)
Triglycerides: 118 mg/dL (ref 0–149)
VLDL CHOLESTEROL CAL: 24 mg/dL (ref 5–40)

## 2016-09-27 LAB — COMPREHENSIVE METABOLIC PANEL
A/G RATIO: 1.7 (ref 1.2–2.2)
ALK PHOS: 80 IU/L (ref 39–117)
ALT: 46 IU/L — ABNORMAL HIGH (ref 0–44)
AST: 44 IU/L — ABNORMAL HIGH (ref 0–40)
Albumin: 4.6 g/dL (ref 3.6–4.8)
BILIRUBIN TOTAL: 0.8 mg/dL (ref 0.0–1.2)
BUN / CREAT RATIO: 9 — AB (ref 10–24)
BUN: 9 mg/dL (ref 8–27)
CO2: 28 mmol/L (ref 18–29)
CREATININE: 0.96 mg/dL (ref 0.76–1.27)
Calcium: 10.3 mg/dL — ABNORMAL HIGH (ref 8.6–10.2)
Chloride: 94 mmol/L — ABNORMAL LOW (ref 96–106)
GFR, EST AFRICAN AMERICAN: 94 mL/min/{1.73_m2} (ref >59)
GFR, EST NON AFRICAN AMERICAN: 81 mL/min/{1.73_m2} (ref >59)
GLOBULIN, TOTAL: 2.7 g/dL (ref 1.5–4.5)
Glucose: 101 mg/dL — ABNORMAL HIGH (ref 65–99)
Potassium: 4.5 mmol/L (ref 3.5–5.2)
Sodium: 139 mmol/L (ref 134–144)
Total Protein: 7.3 g/dL (ref 6.0–8.5)

## 2016-09-27 NOTE — Telephone Encounter (Signed)
LMTCB

## 2016-09-27 NOTE — Telephone Encounter (Signed)
NA

## 2016-09-27 NOTE — Telephone Encounter (Signed)
-----   Message from Birdie Sons, MD sent at 09/27/2016  8:02 AM EST ----- Blood sugar, kidney functions, electrolytes and cholesterol are all normal. Check labs yearly.

## 2016-09-28 NOTE — Telephone Encounter (Signed)
NA

## 2016-10-03 NOTE — Telephone Encounter (Signed)
Na lmtcb. sd

## 2016-10-18 ENCOUNTER — Ambulatory Visit (INDEPENDENT_AMBULATORY_CARE_PROVIDER_SITE_OTHER): Payer: Medicare Other | Admitting: Pharmacist

## 2016-10-18 VITALS — BP 134/76 | HR 79

## 2016-10-18 DIAGNOSIS — I10 Essential (primary) hypertension: Secondary | ICD-10-CM | POA: Diagnosis not present

## 2016-10-18 NOTE — Patient Instructions (Addendum)
Blood pressure today oin office is 134/76 with pulse 79  Continue all medication as prescribed  Keep record of home BP monitoring and bring to next follow up visit  Follow up in with cardiologist as per appointment   Hypertension Hypertension is another name for high blood pressure. High blood pressure forces your heart to work harder to pump blood. A blood pressure reading has two numbers, which includes a higher number over a lower number (example: 110/72). Follow these instructions at home:  Have your blood pressure rechecked by your doctor.  Only take medicine as told by your doctor. Follow the directions carefully. The medicine does not work as well if you skip doses. Skipping doses also puts you at risk for problems.  Do not smoke.  Monitor your blood pressure at home as told by your doctor. Contact a doctor if:  You think you are having a reaction to the medicine you are taking.  You have repeat headaches or feel dizzy.  You have puffiness (swelling) in your ankles.  You have trouble with your vision. Get help right away if:  You get a very bad headache and are confused.  You feel weak, numb, or faint.  You get chest or belly (abdominal) pain.  You throw up (vomit).  You cannot breathe very well. This information is not intended to replace advice given to you by your health care provider. Make sure you discuss any questions you have with your health care provider. Document Released: 03/12/2008 Document Revised: 03/01/2016 Document Reviewed: 07/17/2013 Elsevier Interactive Patient Education  2017 [employer].

## 2016-10-18 NOTE — Progress Notes (Signed)
10/18/2016 [name] 01-01-1946 [id number]   HPI:  [name] is a 71 y.o. male patient of Dr [name] PMH includes Atrial fibrillation, hypertension and non-ischemic  cardiomyopathy. Patient presents today for hypertension clinic follow up.  He has had hypertension, well controlled for many years, however in the past few months has noted an increase.  He has had no changes in diet or lifestyle which could explain this.   HCTZ 25 mg was discontinue during last office visit and chlorthalidone 25mg  initiated on the same day (09/11/2016).  BMET completed on 09/26/16 showed stable renal function with SCr  0.96 and K 4.4  Patient denies headache , dizziness, swelling and fatigue. Only short of breath after climbing steps but not a new symptom. Continues to work our daily and gets BP readings every morning at the [name]  Patient stated his BP has been out of control due to recent medication changes. He also stated he will prefer not to come here for follow up with pharmacist clinic and expressed interest in seen doctor for next visit as soon as possible.  Blood Pressure Goal:  130/80  Current Medications:  Benazepril 20 mg bid  Carvedilol 12.5 mg bid  Chlorthalidone 25 mg daily  Family Hx:  Mother still living at 39 with AF, 4 siblings, only 1 with "hearet disease"  Social Hx:  Drinks 2+ beers per day, most days of the week; gave up caffeine; no tobacco  Diet:  Eats about half home cooked, half sandwich shop/restaurant; does not add salt, but does eat some boxed/canned foods  Exercise:  Goes to [employer] most days, works with Emergency planning/management officer and cardiovascular; on days he doesn't go, he walks for 20-30 minutes   Home BP readings: No record available today. Patient stated average is ~170/90 every morning about 1 hour after taking his medication.   Intolerances: None  Wt Readings from Last 3 Encounters:  09/26/16 244 lb (110.7 kg)  09/25/16 239 lb 8 oz (108.6 kg)    09/05/16 240 lb 8 oz (109.1 kg)   BP Readings from Last 3 Encounters:  09/26/16 (!) 154/84  09/25/16 (!) 152/100  09/11/16 (!) 156/92   Pulse Readings from Last 3 Encounters:  09/26/16 72  09/25/16 80  09/11/16 80    Current Outpatient Prescriptions  Medication Sig Dispense Refill  . benazepril (LOTENSIN) 20 MG tablet take 1 tablet by mouth twice a day 60 tablet 2  . carvedilol (COREG) 12.5 MG tablet Take 1 tablet (12.5 mg total) by mouth 2 (two) times daily. 60 tablet 6  . chlorthalidone (HYGROTON) 25 MG tablet Take 1 tablet (25 mg total) by mouth daily. 30 tablet 3  . Multiple Vitamin (MULTIVITAMIN) tablet Take 1 tablet by mouth daily.      [name] 20 MG TABS tablet take 1 tablet by mouth once daily with SUPPER 30 tablet 6   No current facility-administered medications for this visit.     No Known Allergies  Past Medical History:  Diagnosis Date  . BPH with obstruction/lower urinary tract symptoms   . Chronic atrial fibrillation (HCC)   . Dislocation of shoulder, anterior, right, closed 07/24/2014  . Echocardiogram abnormal    2009 moderate to severely dilated left atrium  . Erectile dysfunction   . Essential hypertension   . Gout   . History of chicken pox   . History of measles   . Hyperglycemia   . Hypogonadism in male   . Nocturia   .  Peyronie's disease   . Traumatic tear of right rotator cuff 07/24/2014  . Venous stasis    chronic    Assessment/Plan:  Hypertension -  Although BP today in office is 134/76 with HR of 79 bpm patient reports significantly elevated BP from [employer]  readings but no evidence provided today. Medication may be titration further with chlorthalidone 50 mg with BMET 2 weeks after dose change, but patient has reservations about follow up with pharmacists clinic and recent changes in mediation.  Will continue current medication as prescribed (no changes). Patient encouraged to continue home BP monitoring and bring records for  next f/u with cardiologist in 2 months.  Follow up with pharmacist clinic as needed.  [name] PharmD, BCPS [facility] [address] [place] [address] 10/18/2016 3:10 PM

## 2016-12-09 ENCOUNTER — Other Ambulatory Visit: Payer: Self-pay | Admitting: Cardiology

## 2016-12-19 ENCOUNTER — Ambulatory Visit: Payer: Medicare Other | Admitting: Cardiology

## 2016-12-26 ENCOUNTER — Ambulatory Visit (INDEPENDENT_AMBULATORY_CARE_PROVIDER_SITE_OTHER): Payer: Medicare Other | Admitting: Cardiology

## 2016-12-26 ENCOUNTER — Encounter: Payer: Self-pay | Admitting: Cardiology

## 2016-12-26 VITALS — BP 142/70 | HR 78 | Ht 75.0 in | Wt 242.0 lb

## 2016-12-26 DIAGNOSIS — I482 Chronic atrial fibrillation, unspecified: Secondary | ICD-10-CM

## 2016-12-26 DIAGNOSIS — I428 Other cardiomyopathies: Secondary | ICD-10-CM | POA: Diagnosis not present

## 2016-12-26 DIAGNOSIS — I10 Essential (primary) hypertension: Secondary | ICD-10-CM | POA: Diagnosis not present

## 2016-12-26 DIAGNOSIS — R6889 Other general symptoms and signs: Secondary | ICD-10-CM

## 2016-12-26 NOTE — Patient Instructions (Signed)
Medication Instructions:  Your physician recommends that you continue on your current medications as directed. Please refer to the Current Medication list given to you today.  Labwork: None   Testing/Procedures: None  Follow-Up: Your physician wants you to follow-up in: 12 months with Dr Ellyn Hack. You will receive a reminder letter in the mail two months in advance. If you don't receive a letter, please call our office to schedule the follow-up appointment.  Any Other Special Instructions Will Be Listed Below (If Applicable).     If you need a refill on your cardiac medications before your next appointment, please call your pharmacy.

## 2016-12-26 NOTE — Progress Notes (Signed)
PCP: [name], MD  Clinic Note: Chief Complaint  Patient presents with  . Atrial Fibrillation  . Hypertension    HPI: [name] is a 71 y.o. male with a PMH below who presents today for essentially 3 month follow-up for his atrial fibrillation management. He has chronic/persistent atrial fibrillation diagnosed way back in 2005. He also at that time had nonischemic cardiomyopathy that has subsequently resolved with a EF of 55% by echo in 2009. I last saw her in Sept 2017.  [name] was last seen on Nov 14 by [name] - he was noted to be hypertensive, but reluctant to adjust medications - suggesting home readings in 120s. They also discussed ? OSA evaluation.  He was then seen in [name] [facility] by [name],RPH-CCP  -  HCTZ converted to Chlorthalidone --> then  by [name] Rodriquez-Guzman, RPH-CCP - BP was 134/76 -- Chlorthalidone increased to 50mg    Recent Hospitalizations: None  Studies Reviewed: None  Interval History: [name] presents today pretty much saying he feels fine. He still has dyspnea walking up stairs, but has no sensation whatsoever being in atrial fibrillation. He has no PND, orthopnea with minimal if any edema. No chest tightness pressure with rest or exertion. No resting dyspnea. No sensation of rapid, irregular heartbeats or palpitations to suggest he even knows he is in A. fib. No syncope/near-syncope or TIA/amaurosis fugax.  No melena, hematochezia, hematuria, or epstaxis. No claudication.  ROS: A comprehensive was performed. Review of Systems  Constitutional: Negative.   HENT: Negative for congestion.   Respiratory: Negative for cough.   Cardiovascular:       Per history of present illness  Gastrointestinal: Negative for abdominal pain, constipation and heartburn.  Musculoskeletal: Positive for joint pain (Normal arthritis pain).  Psychiatric/Behavioral: The patient does not have insomnia.   All other systems reviewed and are  negative.   Past Medical History:  Diagnosis Date  . BPH with obstruction/lower urinary tract symptoms   . Chronic atrial fibrillation ([place])   . Dislocation of shoulder, anterior, right, closed 07/24/2014  . Echocardiogram abnormal    2009 moderate to severely dilated left atrium  . Erectile dysfunction   . Essential hypertension   . Gout   . History of chicken pox   . History of measles   . Hyperglycemia   . Hypogonadism in male   . Nocturia   . Peyronie's disease   . Traumatic tear of right rotator cuff 07/24/2014  . Venous stasis    chronic    Past Surgical History:  Procedure Laterality Date  . CARDIAC CATHETERIZATION  03/28/2004   normal coronaries, reduced EF at 25-30% (Dr. Gerrie Nordmann)  . CARDIOVERSION, TEE guided  03/29/2004   Dr. Gerrie Nordmann   . COLONOSCOPY  05/28/2013   Dr. Rayann Heman  . HERNIA REPAIR  age 64  . KNEE SURGERY    . LUMBAR FUSION  age 39  . MYOVIEW CARDIOVASCULAR STRESS TEST  07/2003   anterior wall thinning, LV systolic function depressed at 33%  . PILONIDAL CYST EXCISION    . Right Eyebrow Tumor Removed  age 42   . SHOULDER ARTHROSCOPY WITH ROTATOR CUFF REPAIR AND SUBACROMIAL DECOMPRESSION Right 08/19/2014   Procedure: SHOULDER ARTHROSCOPY WITH ROTATOR CUFF REPAIR AND SUBACROMIAL DECOMPRESSION;  Surgeon: Lorn Junes, MD;  Location: [facility];  Service: Orthopedics;  Laterality: Right;  . TOTAL HIP ARTHROPLASTY Right 03/25/2006   Dr. Wanda Plump. [place]  . TRANSTHORACIC ECHOCARDIOGRAM  08/2008  EF=>55%, mild-mod dilated; LA mod-severely dilated; RA mild-mod dilated; mild calcif of MV, borderline MVP, trace MR; trace TR; mild pulm valve regurg     Current Meds  Medication Sig  . benazepril (LOTENSIN) 20 MG tablet take 1 tablet by mouth twice a day  . carvedilol (COREG) 12.5 MG tablet Take 1 tablet (12.5 mg total) by mouth 2 (two) times daily.  . chlorthalidone (HYGROTON) 25 MG tablet Take 1 tablet (25 mg total) by mouth daily. (Patient  taking differently: Take 50 mg by mouth daily. )  . Multiple Vitamin (MULTIVITAMIN) tablet Take 1 tablet by mouth daily.    Alveda Reasons 20 MG TABS tablet take 1 tablet by mouth once daily with SUPPER    No Known Allergies  Social History   Social History  . Marital status: Single    Spouse name: [name]  . Number of children: 1  . Years of education: [name]   Occupational History  . retired Pharmacist, hospital    Social History Main Topics  . Smoking status: Never Smoker  . Smokeless tobacco: Never Used  . Alcohol use 0.0 oz/week     Comment: 2-3 beers most days  . Drug use: No  . Sexual activity: Not Asked   Other Topics Concern  . None   Social History Narrative   Divorced father of 1. Retired Education officer, museum.   ~2 beers / day. Does not smoke.   Works out 6 days a weeks - cardio & weights. He used to run and play basketball prior to his hip surgery in 2007.    family history includes Arthritis in his sister; Bladder Cancer in his father; Diabetes in his maternal uncle and paternal grandfather; Hypertension in his father and mother; Prostate cancer in his father; Stroke in his maternal grandfather; Supraventricular tachycardia in his brother and child.  Wt Readings from Last 3 Encounters:  12/26/16 109.8 kg (242 lb)  09/26/16 110.7 kg (244 lb)  09/25/16 108.6 kg (239 lb 8 oz)    PHYSICAL EXAM BP (!) 142/70   Pulse 78   Ht 6\' 3"  (1.905 m)   Wt 109.8 kg (242 lb)   BMI 30.25 kg/m  General:, he is a very pleasant, healthy-appearing gentleman; well-groomed, well-nourished, in no acute distress. He is deeply tanned, A&O x3, answers questions appropriately. Pleasant mood & affect HEENT: NCAT. EOMI. MMM. Anicteric sclerae.  Neck: Supple. No LAN, JVD or carotid bruit.  Lungs: CTAB. Nonlabored. Normal effort. Good air movement.  Heart: Irregularly irregular. Normal S1, S2. No M/R/G. Nondisplaced PMI. I do not hear a right-sided carotid bruit that Dr.Little heard.  Abdomen is soft, NT,  ND. NABS. No HSM.  Extremities: No C/C, but trace edema on the left side. Neuro: CN II-XII grossly intact.  Non-focal.    Adult ECG Report  Rate: 78 ;  Rhythm: atrial fibrillation and Left axis deviation (-33). Nonspecific IVCD with nonspecific ST and T-wave changes.;   Narrative Interpretation: Otherwise stable EKG with normal intervals and durations.   Other studies Reviewed: Additional studies/ records that were reviewed today include:  Recent Labs:   Lab Results  Component Value Date   CREATININE 0.96 09/26/2016   BUN 9 09/26/2016   NA 139 09/26/2016   K 4.5 09/26/2016   CL 94 (L) 09/26/2016   CO2 28 09/26/2016   Lab Results  Component Value Date   CHOL 187 09/26/2016   HDL 64 09/26/2016   LDLCALC 99 09/26/2016   TRIG 118 09/26/2016   CHOLHDL  2.9 09/26/2016     ASSESSMENT / PLAN: Problem List Items Addressed This Visit    Chronic atrial fibrillation (HCC) - CHA2DS2-VASc Score 3; On Xarelto (Chronic)    Healing notes exertional dyspnea going up steps rapidly. Otherwise he has no sensation of being in A. fib or not. No further heart failure symptoms. No rapid heartbeats.      Essential hypertension - Primary (Chronic)    Not adequately controlled still, but he has been having some dizziness when he increase his chlorthalidone to 50 mg. At this point, his blood pressure looks pretty stable. My inclination would be to leave his current regimen alone. We could potentially ensure that he is taking chlorthalidone 50 mg daily, but I think allowing for blood pressure in the 140 mmHg is probably okay in the absence of symptoms. He would be reluctant to add a new medicine, therefore think we would need to be able to work with the medications were on. I did not want to further titrate carvedilol because of history of chronotropic incompetence.  He will continue to follow his blood pressures, and if they are still higher than 630Z systolic, I would recommend first increasing  chlorthalidone 50 mg then potentially considering adding another medication such as amlodipine.      Relevant Orders   EKG 12-Lead (Completed)   Exercise intolerance    He only notes symptoms going upstairs. We worried about chronotropic incompetence, therefore I was reluctant to change his beta blocker dose 2 months. Things did not seem to change much having gone back to 12.5 mg twice a day of carvedilol.      NICM (nonischemic cardiomyopathy) ([place])    Essentially resolved now. No further symptoms.         Current medicines are reviewed at length with the patient today. (+/- concerns) None The following changes have been made: None  Patient Instructions  Medication Instructions:  Your physician recommends that you continue on your current medications as directed. Please refer to the Current Medication list given to you today.  Labwork: None   Testing/Procedures: None  Follow-Up: Your physician wants you to follow-up in: 12 months with Dr Ellyn Hack. You will receive a reminder letter in the mail two months in advance. If you don't receive a letter, please call our office to schedule the follow-up appointment.  Any Other Special Instructions Will Be Listed Below (If Applicable).     If you need a refill on your cardiac medications before your next appointment, please call your pharmacy.    Studies Ordered:   Orders Placed This Encounter  Procedures  . EKG 12-Lead      Glenetta Hew, M.D., M.S. Interventional Cardiologist   Pager # [phone] Phone # [phone] [address] [place], [place] [address]

## 2016-12-27 ENCOUNTER — Encounter: Payer: Self-pay | Admitting: Cardiology

## 2016-12-27 NOTE — Assessment & Plan Note (Signed)
Essentially resolved now. No further symptoms.

## 2016-12-27 NOTE — Assessment & Plan Note (Signed)
Not adequately controlled still, but he has been having some dizziness when he increase his chlorthalidone to 50 mg. At this point, his blood pressure looks pretty stable. My inclination would be to leave his current regimen alone. We could potentially ensure that he is taking chlorthalidone 50 mg daily, but I think allowing for blood pressure in the 140 mmHg is probably okay in the absence of symptoms. He would be reluctant to add a new medicine, therefore think we would need to be able to work with the medications were on. I did not want to further titrate carvedilol because of history of chronotropic incompetence.  He will continue to follow his blood pressures, and if they are still higher than 993T systolic, I would recommend first increasing chlorthalidone 50 mg then potentially considering adding another medication such as amlodipine.

## 2016-12-27 NOTE — Assessment & Plan Note (Signed)
Healing notes exertional dyspnea going up steps rapidly. Otherwise he has no sensation of being in A. fib or not. No further heart failure symptoms. No rapid heartbeats.

## 2016-12-27 NOTE — Assessment & Plan Note (Signed)
He only notes symptoms going upstairs. We worried about chronotropic incompetence, therefore I was reluctant to change his beta blocker dose 2 months. Things did not seem to change much having gone back to 12.5 mg twice a day of carvedilol.

## 2016-12-28 ENCOUNTER — Telehealth: Payer: Self-pay | Admitting: *Deleted

## 2016-12-28 NOTE — Telephone Encounter (Signed)
Left message to call back --    per Dr Ellyn Hack , Would like to verify patient is taking  chlorthalidone Carvedilol  dosage  ? How he is taking medication?

## 2016-12-31 NOTE — Telephone Encounter (Signed)
Thanks  DH 

## 2016-12-31 NOTE — Telephone Encounter (Signed)
Spoke to patient.  patient confirmed he is taking   Carvedilol 12.5 mg twice a day and  chlorthalidone 25 mg twice a day  RN also confirmed with  formerly [facility] ( now [facility])  pharmacy patient has picked up medication 12/30/16 of chlorthalidone 25 mg daily not 50 mg .  will forward to Dr Ellyn Hack

## 2016-12-31 NOTE — Telephone Encounter (Signed)
New message     Pt is returning Sharons call about follow up call

## 2016-12-31 NOTE — Telephone Encounter (Signed)
LEFT MESSAGE TO CALL BACK

## 2017-01-07 NOTE — Telephone Encounter (Signed)
Correction;  Patient is taking 25 mg  Chlorthalidone daily only not twice a day  will correct on medication list  Patient never increase to 50 mg

## 2017-01-08 ENCOUNTER — Other Ambulatory Visit: Payer: Self-pay | Admitting: Cardiology

## 2017-01-08 NOTE — Telephone Encounter (Signed)
Rx request sent to pharmacy.  

## 2017-01-28 ENCOUNTER — Other Ambulatory Visit: Payer: Self-pay | Admitting: Cardiology

## 2017-01-28 DIAGNOSIS — I10 Essential (primary) hypertension: Secondary | ICD-10-CM

## 2017-03-18 ENCOUNTER — Other Ambulatory Visit: Payer: Self-pay | Admitting: Cardiology

## 2017-03-27 ENCOUNTER — Ambulatory Visit: Payer: Medicare Other | Admitting: Physician Assistant

## 2017-04-11 ENCOUNTER — Ambulatory Visit (INDEPENDENT_AMBULATORY_CARE_PROVIDER_SITE_OTHER): Payer: Medicare Other | Admitting: Family Medicine

## 2017-04-11 ENCOUNTER — Encounter: Payer: Self-pay | Admitting: Family Medicine

## 2017-04-11 VITALS — BP 118/68 | Temp 98.5°F | Resp 16 | Ht 75.0 in | Wt 241.0 lb

## 2017-04-11 DIAGNOSIS — R1084 Generalized abdominal pain: Secondary | ICD-10-CM | POA: Diagnosis not present

## 2017-04-11 DIAGNOSIS — R140 Abdominal distension (gaseous): Secondary | ICD-10-CM

## 2017-04-11 NOTE — Progress Notes (Signed)
Patient: [name] Male    DOB: 04/03/1946   71 y.o.   MRN: [id number] Visit Date: 04/11/2017  Today's Provider: [name], MD   Chief Complaint  Patient presents with  . Abdominal Pain   Subjective:    Abdominal Pain  This is a new problem. Episode onset: several months ago. The onset quality is gradual. The problem occurs constantly. The problem has been gradually worsening. The pain is located in the generalized abdominal region. The pain is mild. The quality of the pain is a sensation of fullness and cramping. The abdominal pain does not radiate. Associated symptoms include constipation, diarrhea, headaches, melena and nausea. Pertinent negatives include no fever, hematochezia or hematuria. The pain is aggravated by eating. The pain is relieved by nothing. He has tried nothing for the symptoms.  He had unremarkable colonoscopy in 2014 by Dr. Rayann Heman. He has no change in diet, but he was changed from hctz to chlorthalidone by his cardiologist a few months ago.      No Known Allergies   Current Outpatient Prescriptions:  .  benazepril (LOTENSIN) 20 MG tablet, take 1 tablet by mouth twice a day, Disp: 60 tablet, Rfl: 9 .  carvedilol (COREG) 12.5 MG tablet, take 1 tablet by mouth twice a day, Disp: 60 tablet, Rfl: 6 .  chlorthalidone (HYGROTON) 25 MG tablet, Take 1 tablet (25 mg total) by mouth daily., Disp: 30 tablet, Rfl: 11 .  Multiple Vitamin (MULTIVITAMIN) tablet, Take 1 tablet by mouth daily.  , Disp: , Rfl:  .  XARELTO 20 MG TABS tablet, take 1 tablet by mouth once daily with SUPPER, Disp: 30 tablet, Rfl: 6  Review of Systems  Constitutional: Negative for fever.  Gastrointestinal: Positive for abdominal pain, constipation, diarrhea, melena and nausea. Negative for hematochezia.  Genitourinary: Negative for hematuria.  Neurological: Positive for headaches.    Social History  Substance Use Topics  . Smoking status: Never Smoker  . Smokeless tobacco: Never Used   . Alcohol use 0.0 oz/week     Comment: 2-3 beers most days   Objective:   BP 118/68 (BP Location: Right Arm, Patient Position: Sitting)   Temp 98.5 F (36.9 C)   Resp 16   Ht 6\' 3"  (1.905 m)   Wt 241 lb (109.3 kg)   BMI 30.12 kg/m  Vitals:   04/11/17 1142  BP: 118/68  Resp: 16  Temp: 98.5 F (36.9 C)  Weight: 241 lb (109.3 kg)  Height: 6\' 3"  (1.905 m)     Physical Exam  General Appearance:    Alert, cooperative, no distress  Eyes:    PERRL, conjunctiva/corneas clear, EOM's intact       Lungs:     Clear to auscultation bilaterally, respirations unlabored  Heart:    Regular rate and rhythm  Abdomen:   bowel sounds present and normal in all 4 quadrants, soft, round, nontender or nondistended. No CVA tenderness        Assessment & Plan:     1. Generalized abdominal pain  - Comprehensive metabolic panel - CBC - T4 AND TSH - Gliadin antibodies, serum - Tissue transglutaminase, IgA - Reticulin Antibody, IgA w reflex titer  2. Abdominal bloating  - Comprehensive metabolic panel - CBC - T4 AND TSH - Gliadin antibodies, serum - Tissue transglutaminase, IgA - Reticulin Antibody, IgA w reflex titer  Only medication change was change from hctz to chlorthalidone, which is unlikely to have caused symptoms. Consider abdominal  ultrasound if labs normal. Consider GI referral if labs and u/s are normal. HJe can also try lactose free then gluten free diet.       [name], MD  [facility]

## 2017-04-11 NOTE — Patient Instructions (Addendum)
I recommend that you try an OTC probiotic supplements, such as Align to help your digestive tract work better.    You may also try eliminating lactose from your diet. If this doesn't help, try eliminating gluten from your diet.    Lactose-Free Diet, Adult If you have lactose intolerance, you are not able to digest lactose. Lactose is a natural sugar found mainly in milk and milk products. You may need to avoid all foods and beverages that contain lactose. A lactose-free diet can help you do this. What do I need to know about this diet?  Do not consume foods, beverages, vitamins, minerals, or medicines with lactose. Read ingredients lists carefully.  Look for the words "lactose-free" on labels.  Use lactase enzyme drops or tablets as directed by your health care provider.  Use lactose-free milk or a milk alternative, such as soy milk, for drinking and cooking.  Make sure you get enough calcium and vitamin D in your diet. A lactose-free eating plan can be lacking in these important nutrients.  Take calcium and vitamin D supplements as directed by your health care provider. Talk to your provider about supplements if you are not able to get enough calcium and vitamin D from food. Which foods have lactose? Lactose is found in:  Milk and foods made from milk.  Yogurt.  Cheese.  Butter.  Margarine.  Sour cream.  Cream.  Whipped toppings and nondairy creamers.  Ice cream and other milk-based desserts.  Lactose is also found in foods or products made with milk or milk ingredients. To find out whether a food contains milk or a milk ingredient, look at the ingredients list. Avoid foods with the statement "May contain milk" and foods that contain:  Butter.  Cream.  Milk.  Milk solids.  Milk powder.  Whey.  Curd.  Caseinate.  Lactose.  Lactalbumin.  Lactoglobulin.  What are some alternatives to milk and foods made with milk products?  Lactose-free milk.  Soy  milk with added calcium and vitamin D.  Almond, coconut, or rice milk with added calcium and vitamin D. Note that these are low in protein.  Soy products, such as soy yogurt, soy cheese, soy ice cream, and soy-based sour cream. Which foods can I eat? Grains Breads and rolls made without milk, such as [place], [place], or [place] bread, bagels, pita, and [employer]. Corn tortillas, corn meal, grits, and polenta. Crackers without lactose or milk solids, such as soda crackers and graham crackers. Cooked or dry cereals without lactose or milk solids. Pasta, quinoa, couscous, barley, oats, bulgur, farro, rice, wild rice, or other grains prepared without milk or lactose. Plain popcorn. Vegetables Fresh, frozen, and canned vegetables without cheese, cream, or butter sauces. Fruits All fresh, canned, frozen, or dried fruits that are not processed with lactose. Meats and Other Protein Sources Plain beef, chicken, fish, [place], lamb, veal, pork, wild game, or ham. Kosher-prepared meat products. Strained or junior meats that do not contain milk. Eggs. Soy meat substitutes. Beans, lentils, and hummus. Tofu. Nuts and seeds. Peanut or other nut butters without lactose. Soups, casseroles, and mixed dishes without cheese, cream, or milk. Dairy Lactose-free milk. Soy, rice, or almond milk with added calcium and vitamin D. Soy cheese and yogurt. Beverages Carbonated drinks. Tea. Coffee, freeze-dried coffee, and some instant coffees. Fruit and vegetable juices. Condiments Soy sauce. Carob powder. Olives. Gravy made with water. Baker's cocoa. [name] Pure seasonings and spices. Ketchup. Mustard. Bouillon. Broth. Sweets and Desserts Water and fruit ices.  Gelatin. Cookies, pies, or cakes made from allowed ingredients, such as angel food cake. Pudding made with water or a milk substitute. Lactose-free tofu desserts. Soy, coconut milk, or rice-milk-based frozen desserts. Sugar. Honey. Jam, jelly, and marmalade. Molasses.  Pure sugar candy. Dark chocolate without milk. Marshmallows. Fats and Oils Margarines and salad dressings that do not contain milk. Berniece Salines. Vegetable oils. Shortening. Mayonnaise. Soy or coconut-based cream. The items listed above may not be a complete list of recommended foods or beverages. Contact your dietitian for more options. Which foods are not recommended? Grains Breads and rolls that contain milk. Toaster pastries. Muffins, biscuits, waffles, cornbread, and pancakes. These can be prepared at home, commercial, or from mixes. Sweet rolls, donuts, English muffins, fry bread, lefse, flour tortillas with lactose, or [place] toast made with milk or milk ingredients. Crackers that contain lactose. Corn curls. Cooked or dry cereals with lactose. Vegetables Creamed or breaded vegetables. Vegetables in a cheese or butter sauce or with lactose-containing margarines. Instant potatoes. [place] fries. Scalloped or au gratin potatoes. Fruits None. Meats and Other Protein Sources Scrambled eggs, omelets, and souffles that contain milk. Creamed or breaded meat, fish, chicken, or [place]. Sausage products, such as wieners and liver sausage. Cold cuts that contain milk solids. Cheese, cottage cheese, ricotta cheese, and cheese spreads. Lasagna and macaroni and cheese. Pizza. Peanut or other nut butters with added milk solids. Casseroles or mixed dishes containing milk or cheese. Dairy All dairy products, including milk, goat's milk, buttermilk, kefir, acidophilus milk, flavored milk, evaporated milk, condensed milk, dulce de [place], eggnog, yogurt, cheese, and cheese spreads. Beverages Hot chocolate. Cocoa with lactose. Instant iced teas. Powdered fruit drinks. Smoothies made with milk or yogurt. Condiments Chewing gum that has lactose. Cocoa that has lactose. Spice blends if they contain milk products. Artificial sweeteners that contain lactose. Nondairy creamers. Sweets and Desserts Ice cream, ice milk,  gelato, sherbet, and frozen yogurt. Custard, pudding, and mousse. Cake, cream pies, cookies, and other desserts containing milk, cream, cream cheese, or milk chocolate. Pie crust made with milk-containing margarine or butter. Reduced-calorie desserts made with a sugar substitute that contains lactose. Toffee and butterscotch. Milk, white, or dark chocolate that contains milk. Fudge. Caramel. Fats and Oils Margarines and salad dressings that contain milk or cheese. Cream. Half and half. Cream cheese. Sour cream. Chip dips made with sour cream or yogurt. The items listed above may not be a complete list of foods and beverages to avoid. Contact your dietitian for more information. Am I getting enough calcium? Calcium is found in many foods that contain lactose and is important for bone health. The amount of calcium you need depends on your age:  Adults younger than 50 years: 1000 mg of calcium a day.  Adults older than 50 years: 1200 mg of calcium a day.  If you are not getting enough calcium, other calcium sources include:  Orange juice with calcium added. There are 300-350 mg of calcium in 1 cup of orange juice.  Sardines with edible bones. There are 325 mg of calcium in 3 oz of sardines.  Calcium-fortified soy milk. There are 300-400 mg of calcium in 1 cup of calcium-fortified soy milk.  Calcium-fortified rice or almond milk. There are 300 mg of calcium in 1 cup of calcium-fortified rice or almond milk.  Canned salmon with edible bones. There are 180 mg of calcium in 3 oz of canned salmon with edible bones.  Calcium-fortified breakfast cereals. There are [phone] mg of calcium in calcium-fortified breakfast  cereals.  Tofu set with calcium sulfate. There are 250 mg of calcium in  cup of tofu set with calcium sulfate.  Spinach, cooked. There are 145 mg of calcium in  cup of cooked spinach.  Edamame, cooked. There are 130 mg of calcium in  cup of cooked edamame.  Collard greens,  cooked. There are 125 mg of calcium in  cup of cooked collard greens.  Kale, frozen or cooked. There are 90 mg of calcium in  cup of cooked or frozen kale.  Almonds. There are 95 mg of calcium in  cup of almonds.  Broccoli, cooked. There are 60 mg of calcium in 1 cup of cooked broccoli.  This information is not intended to replace advice given to you by your health care provider. Make sure you discuss any questions you have with your health care provider. Document Released: 03/16/2002 Document Revised: 03/01/2016 Document Reviewed: 12/25/2013 Elsevier Interactive Patient Education  2018 [place] Gluten is a protein that is found in wheat, barley, rye, and some other grains. Some people have a condition that makes them unable to digest gluten. For those people, eating just a small amount of gluten can damage their intestines. This is not a gluten-free eating plan. This low-gluten eating plan is for people who feel better when they eat less gluten. What do I need to know about this eating plan?  You can eat anything that does not contain wheat or other grains that have gluten.  You can eat anything that is labeled "gluten-free."  Make sure to read food labels.  Eat a variety of foods so you get all of the nutrients that you need.  Avoid processed foods and sauces because many of them contain wheat. To have more control over the ingredients in your meals, consider making food yourself instead of buying prepared foods. What foods can I eat? Grains Rice. Bulgur. Quinoa. Corn. Buckwheat. Amaranth. Corn tortillas or taco shells. Oatmeal that is labeled as "gluten free" or "uncontaminated." Vegetables Lettuce. Spinach. Peas. Beets. Cauliflower. Cabbage. Broccoli. Carrots. Tomatoes. Squash. Eggplant. Herbs. Peppers. Onions. Cucumbers. Brussels sprouts. Yams and sweet potatoes. Beans. Lentils. Fruits Bananas. Apples. Oranges. Grapes. Papaya. Mango.  Pomegranate. Kiwi. Grapefruit. Cherries. Meats and Other Protein Sources Beef. Pork. Chicken. [place]. Fish. Eggs. Tofu. Beans. Nuts. Lentils. Dairy Milk. Ice cream. Yogurt. Cheese. Cottage cheese. Beverages Water. Coffee. Tea. Juice. Soda. Seltzer water. Condiments Mustard. Relish. Low-fat, low-sugar ketchup. Low-fat, low-sugar barbecue sauce. Vinegar. Low-fat or fat-free mayonnaise. Sweets and Desserts Honey. Sugar. Maple syrup. Fats and Oils Butter. Vegetable oil. Olive oil. Canola oil. [place] oil. Other [place] or cornstarch. Potato flour. The items listed above may not be a complete list of recommended foods or beverages. Contact your dietitian for more options. What foods are not recommended? Grains Wheat. Barley. Rye. Oatmeal. Meat and Other Protein Sources Seitan. Cold cuts. Hotdogs. Salami. Sausages. Beverages Beer. Condiments Malt vinegar. Salad dressing. Soy sauce. Sweets and Desserts Licorice. Brown rice syrup. Pre-made pudding or pudding mixes. Other Bouillon cubes. Canned or boxed pre-made soups or soup packets. Bagged chips, such as potato chips and tortilla chips. Seasoning packets. The items listed above may not be a complete list of foods and beverages to avoid. Contact your dietitian for more information. This information is not intended to replace advice given to you by your health care provider. Make sure you discuss any questions you have with your health care provider. Document Released: 02/08/2015 Document Revised: 03/01/2016 Document  Reviewed: 10/20/2014 Elsevier Interactive Patient Education  [employer].

## 2017-04-13 LAB — COMPREHENSIVE METABOLIC PANEL
A/G RATIO: 1.7 (ref 1.2–2.2)
ALBUMIN: 4.8 g/dL (ref 3.6–4.8)
ALT: 30 IU/L (ref 0–44)
AST: 29 IU/L (ref 0–40)
Alkaline Phosphatase: 77 IU/L (ref 39–117)
BILIRUBIN TOTAL: 0.9 mg/dL (ref 0.0–1.2)
BUN / CREAT RATIO: 13 (ref 10–24)
BUN: 14 mg/dL (ref 8–27)
CO2: 27 mmol/L (ref 20–29)
CREATININE: 1.12 mg/dL (ref 0.76–1.27)
Calcium: 10.1 mg/dL (ref 8.6–10.2)
Chloride: 95 mmol/L — ABNORMAL LOW (ref 96–106)
GFR calc Af Amer: 78 mL/min/{1.73_m2} (ref >59)
GFR calc non Af Amer: 67 mL/min/{1.73_m2} (ref >59)
GLOBULIN, TOTAL: 2.8 g/dL (ref 1.5–4.5)
Glucose: 122 mg/dL — ABNORMAL HIGH (ref 65–99)
POTASSIUM: 4.2 mmol/L (ref 3.5–5.2)
SODIUM: 138 mmol/L (ref 134–144)
Total Protein: 7.6 g/dL (ref 6.0–8.5)

## 2017-04-13 LAB — CBC
Hematocrit: 43.5 % (ref 37.5–51.0)
Hemoglobin: 15.3 g/dL (ref 13.0–17.7)
MCH: 33.8 pg — ABNORMAL HIGH (ref 26.6–33.0)
MCHC: 35.2 g/dL (ref 31.5–35.7)
MCV: 96 fL (ref 79–97)
PLATELETS: 292 10*3/uL (ref 150–379)
RBC: 4.53 x10E6/uL (ref 4.14–5.80)
RDW: 13.6 % (ref 12.3–15.4)
WBC: 6.3 10*3/uL (ref 3.4–10.8)

## 2017-04-13 LAB — RETICULIN ANTIBODIES, IGA W TITER: Reticulin Ab, IgA: NEGATIVE titer (ref <2.5)

## 2017-04-13 LAB — T4 AND TSH
T4, Total: 5.6 ug/dL (ref 4.5–12.0)
TSH: 3.33 u[IU]/mL (ref 0.450–4.500)

## 2017-04-13 LAB — TISSUE TRANSGLUTAMINASE, IGA: Transglutaminase IgA: <2 U/mL (ref 0–3)

## 2017-04-13 LAB — GLIADIN ANTIBODIES, SERUM
ANTIGLIADIN ABS, IGA: 3 U (ref 0–19)
Gliadin IgG: 2 units (ref 0–19)

## 2017-04-15 ENCOUNTER — Telehealth: Payer: Self-pay

## 2017-04-15 ENCOUNTER — Telehealth: Payer: Self-pay | Admitting: Family Medicine

## 2017-04-15 DIAGNOSIS — R1084 Generalized abdominal pain: Secondary | ICD-10-CM

## 2017-04-15 DIAGNOSIS — R140 Abdominal distension (gaseous): Secondary | ICD-10-CM

## 2017-04-15 NOTE — Telephone Encounter (Signed)
-----   Message from Birdie Sons, MD sent at 04/15/2017  1:28 PM EDT ----- Labs are all normal abdominal is not better then need abdominal ultrasound

## 2017-04-15 NOTE — Telephone Encounter (Signed)
Pt is requesting lab results.  QV#[phone]/PN

## 2017-04-15 NOTE — Telephone Encounter (Signed)
Patient advised as below. Patient states the abdominal pain has resolved, but still gets nauseous and dizzy after eating meals, then has to have a bowel movement. Patient wants to hold off on having an abdominal ultrasound right now.

## 2017-04-16 ENCOUNTER — Encounter: Payer: Self-pay | Admitting: Family Medicine

## 2017-04-16 NOTE — Telephone Encounter (Signed)
Pt was advised per [place] per 04/15/17.

## 2017-04-17 NOTE — Addendum Note (Signed)
Addended by: Meyer Cory L on: 04/17/2017 10:40 AM   Modules accepted: Orders

## 2017-04-17 NOTE — Telephone Encounter (Signed)
Patient called back stating that he changed his mind and would like to proceed with having the ultrasound. Please schedule. Thanks

## 2017-04-19 ENCOUNTER — Ambulatory Visit
Admission: RE | Admit: 2017-04-19 | Discharge: 2017-04-19 | Disposition: A | Discharge status: Home / Self Care | Payer: Medicare Other | Source: Ambulatory Visit | Attending: Family Medicine | Admitting: Family Medicine

## 2017-04-19 DIAGNOSIS — R140 Abdominal distension (gaseous): Secondary | ICD-10-CM | POA: Insufficient documentation

## 2017-04-19 DIAGNOSIS — R1084 Generalized abdominal pain: Secondary | ICD-10-CM | POA: Insufficient documentation

## 2017-04-19 DIAGNOSIS — K760 Fatty (change of) liver, not elsewhere classified: Secondary | ICD-10-CM | POA: Diagnosis not present

## 2017-04-19 DIAGNOSIS — N281 Cyst of kidney, acquired: Secondary | ICD-10-CM | POA: Insufficient documentation

## 2017-04-22 ENCOUNTER — Encounter: Payer: Self-pay | Admitting: Family Medicine

## 2017-04-22 DIAGNOSIS — K760 Fatty (change of) liver, not elsewhere classified: Secondary | ICD-10-CM | POA: Insufficient documentation

## 2017-04-22 DIAGNOSIS — N281 Cyst of kidney, acquired: Secondary | ICD-10-CM | POA: Insufficient documentation

## 2017-04-24 ENCOUNTER — Telehealth: Payer: Self-pay | Admitting: Family Medicine

## 2017-04-24 DIAGNOSIS — R1084 Generalized abdominal pain: Secondary | ICD-10-CM

## 2017-04-24 NOTE — Telephone Encounter (Signed)
LMOVM to notify pt that referral to GI is ordered. Please schedule referral to Dr. Tiffany Kocher GI. Thanks!

## 2017-04-24 NOTE — Telephone Encounter (Signed)
Pt called regarding the results of  his [place] test he had done last Friday.    He is calling back for a referral to Dr. Vira Agar gastro.  Pt's call back is [phone]  Thanks, Con Memos

## 2017-05-27 ENCOUNTER — Other Ambulatory Visit: Payer: Self-pay | Admitting: Student

## 2017-05-27 DIAGNOSIS — R140 Abdominal distension (gaseous): Secondary | ICD-10-CM

## 2017-05-27 DIAGNOSIS — K30 Functional dyspepsia: Secondary | ICD-10-CM

## 2017-05-27 DIAGNOSIS — R112 Nausea with vomiting, unspecified: Secondary | ICD-10-CM

## 2017-06-04 ENCOUNTER — Ambulatory Visit
Admission: RE | Admit: 2017-06-04 | Discharge: 2017-06-04 | Disposition: A | Discharge status: Home / Self Care | Payer: Medicare Other | Source: Ambulatory Visit | Attending: Student | Admitting: Student

## 2017-06-04 DIAGNOSIS — R140 Abdominal distension (gaseous): Secondary | ICD-10-CM | POA: Insufficient documentation

## 2017-06-04 DIAGNOSIS — R112 Nausea with vomiting, unspecified: Secondary | ICD-10-CM | POA: Diagnosis not present

## 2017-06-04 DIAGNOSIS — K30 Functional dyspepsia: Secondary | ICD-10-CM | POA: Diagnosis not present

## 2017-06-04 DIAGNOSIS — K449 Diaphragmatic hernia without obstruction or gangrene: Secondary | ICD-10-CM | POA: Diagnosis not present

## 2017-08-08 ENCOUNTER — Encounter: Payer: Medicare Other | Attending: Surgery | Admitting: Surgery

## 2017-08-08 DIAGNOSIS — Z85828 Personal history of other malignant neoplasm of skin: Secondary | ICD-10-CM | POA: Diagnosis not present

## 2017-08-08 DIAGNOSIS — E119 Type 2 diabetes mellitus without complications: Secondary | ICD-10-CM | POA: Insufficient documentation

## 2017-08-08 DIAGNOSIS — I482 Chronic atrial fibrillation: Secondary | ICD-10-CM | POA: Diagnosis not present

## 2017-08-08 DIAGNOSIS — L97212 Non-pressure chronic ulcer of right calf with fat layer exposed: Secondary | ICD-10-CM | POA: Insufficient documentation

## 2017-08-08 DIAGNOSIS — Z7901 Long term (current) use of anticoagulants: Secondary | ICD-10-CM | POA: Diagnosis not present

## 2017-08-08 DIAGNOSIS — I890 Lymphedema, not elsewhere classified: Secondary | ICD-10-CM | POA: Insufficient documentation

## 2017-08-08 DIAGNOSIS — I739 Peripheral vascular disease, unspecified: Secondary | ICD-10-CM | POA: Diagnosis not present

## 2017-08-08 DIAGNOSIS — I10 Essential (primary) hypertension: Secondary | ICD-10-CM | POA: Insufficient documentation

## 2017-08-08 DIAGNOSIS — I429 Cardiomyopathy, unspecified: Secondary | ICD-10-CM | POA: Diagnosis not present

## 2017-08-08 DIAGNOSIS — M109 Gout, unspecified: Secondary | ICD-10-CM | POA: Insufficient documentation

## 2017-08-08 DIAGNOSIS — I872 Venous insufficiency (chronic) (peripheral): Secondary | ICD-10-CM | POA: Diagnosis not present

## 2017-08-08 DIAGNOSIS — N400 Enlarged prostate without lower urinary tract symptoms: Secondary | ICD-10-CM | POA: Insufficient documentation

## 2017-08-10 NOTE — Progress Notes (Signed)
[name], [name] ([id number]) Visit Report for 08/08/2017 Abuse/Suicide Risk Screen Details Patient Name: [name], BARB. Date of Service: 08/08/2017 10:30 AM Medical Record Number: [id number] Patient Account Number: [id number] Date of Birth/Sex: 05-Jan-1946 (71 y.o. Male) Treating RN: Phillis Haggis Primary Care Shafer Swamy: [name] Other Clinician: Referring Layken Beg: Referral, Self Treating Kyleena Scheirer/Extender: Rudene Re in Treatment: 0 Abuse/Suicide Risk Screen Items Answer ABUSE/SUICIDE RISK SCREEN: Has anyone close to you tried to hurt or harm you recentlyo No Do you feel uncomfortable with anyone in your familyo No Has anyone forced you do things that you didnot want to doo No Do you have any thoughts of harming yourselfo No Patient displays signs or symptoms of abuse and/or neglect. No Electronic Signature(s) Signed: 08/08/2017 4:53:27 PM By: Alejandro Mulling Entered By: Alejandro Mulling on 08/08/2017 10:50:50 [name] ([id number]) -------------------------------------------------------------------------------- Activities of Daily Living Details Patient Name: [name], [name] R. Date of Service: 08/08/2017 10:30 AM Medical Record Number: [id number] Patient Account Number: [id number] Date of Birth/Sex: 1946-03-31 (71 y.o. Male) Treating RN: Phillis Haggis Primary Care Salia Cangemi: [name] Other Clinician: Referring Shaunita Seney: Referral, Self Treating Dezmon Conover/Extender: Rudene Re in Treatment: 0 Activities of Daily Living Items Answer Activities of Daily Living (Please select one for each item) Drive Automobile Completely Able Take Medications Completely Able Use Telephone Completely Able Care for Appearance Completely Able Use Toilet Completely Able Bath / Shower Completely Able Dress Self Completely Able Feed Self Completely Able Walk Completely Able Get In / Out Bed Completely Able Housework Completely Able Prepare Meals Completely  Able Handle Money Completely Able Shop for Self Completely Able Electronic Signature(s) Signed: 08/08/2017 4:53:27 PM By: Alejandro Mulling Entered By: Alejandro Mulling on 08/08/2017 10:51:35 [name] ([id number]) -------------------------------------------------------------------------------- Education Assessment Details Patient Name: [name], [name] R. Date of Service: 08/08/2017 10:30 AM Medical Record Number: [id number] Patient Account Number: [id number] Date of Birth/Sex: May 25, 1946 (71 y.o. Male) Treating RN: Phillis Haggis Primary Care Raymir Frommelt: [name] Other Clinician: Referring Aalyssa Elderkin: Referral, Self Treating Elaysia Devargas/Extender: Rudene Re in Treatment: 0 Primary Learner Assessed: Patient Learning Preferences/Education Level/Primary Language Learning Preference: Explanation, Printed Material Highest Education Level: College or Above Preferred Language: English Cognitive Barrier Assessment/Beliefs Language Barrier: No Translator Needed: No Memory Deficit: No Emotional Barrier: No Cultural/Religious Beliefs Affecting Medical Care: No Physical Barrier Assessment Impaired Vision: No Impaired Hearing: No Decreased Hand dexterity: No Knowledge/Comprehension Assessment Knowledge Level: Medium Comprehension Level: Medium Ability to understand written Medium instructions: Ability to understand verbal Medium instructions: Motivation Assessment Anxiety Level: Calm Cooperation: Cooperative Education Importance: Acknowledges Need Interest in Health Problems: Asks Questions Perception: Coherent Willingness to Engage in Self- Medium Management Activities: Readiness to Engage in Self- Medium Management Activities: Electronic Signature(s) Signed: 08/08/2017 4:53:27 PM By: Alejandro Mulling Entered By: Alejandro Mulling on 08/08/2017 10:51:57 NAHEEM, TENERELLI  ([id number]) -------------------------------------------------------------------------------- Fall Risk Assessment Details Patient Name: [name] Knapp R. Date of Service: 08/08/2017 10:30 AM Medical Record Number: [id number] Patient Account Number: [id number] Date of Birth/Sex: Oct 26, 1945 (71 y.o. Male) Treating RN: Phillis Haggis Primary Care Balin Vandegrift: [name] Other Clinician: Referring Mohab Ashby: Referral, Self Treating Hollynn Garno/Extender: Rudene Re in Treatment: 0 Fall Risk Assessment Items Have you had 2 or more falls in the last 12 monthso 0 No Have you had any fall that resulted in injury in the last 12 monthso 0 No FALL RISK ASSESSMENT: History of falling - immediate or within 3 months 0 No Secondary diagnosis 0 No Ambulatory aid None/bed rest/wheelchair/nurse 0 No Crutches/cane/walker 0 No Furniture 0 No IV  Access/Saline Lock 0 No Gait/Training Normal/bed rest/immobile 0 No Weak 0 No Impaired 0 No Mental Status Oriented to own ability 0 Yes Electronic Signature(s) Signed: 08/08/2017 4:53:27 PM By: Alejandro Mulling Entered By: Alejandro Mulling on 08/08/2017 10:52:09 [name] ([id number]) -------------------------------------------------------------------------------- Foot Assessment Details Patient Name: [name], [name] R. Date of Service: 08/08/2017 10:30 AM Medical Record Number: [id number] Patient Account Number: [id number] Date of Birth/Sex: Nov 18, 1945 (71 y.o. Male) Treating RN: Phillis Haggis Primary Care Sabel Hornbeck: [name] Other Clinician: Referring Landrum Carbonell: Referral, Self Treating Eluterio Seymour/Extender: Rudene Re in Treatment: 0 Foot Assessment Items Site Locations + = Sensation present, - = Sensation absent, C = Callus, U = Ulcer R = Redness, W = Warmth, M = Maceration, PU = Pre-ulcerative lesion F = Fissure, S = Swelling, D = Dryness Assessment Right: Left: Other Deformity: No No Prior Foot Ulcer: No No Prior  Amputation: No No Charcot Joint: No No Ambulatory Status: Ambulatory Without Help Gait: Steady Electronic Signature(s) Signed: 08/08/2017 4:53:27 PM By: Alejandro Mulling Entered By: Alejandro Mulling on 08/08/2017 10:53:21 [name] ([id number]) -------------------------------------------------------------------------------- Nutrition Risk Assessment Details Patient Name: [name], [name] R. Date of Service: 08/08/2017 10:30 AM Medical Record Number: [id number] Patient Account Number: [id number] Date of Birth/Sex: Mar 07, 1946 (71 y.o. Male) Treating RN: Phillis Haggis Primary Care Abbygale Lapid: [name] Other Clinician: Referring Tavionna Grout: Referral, Self Treating Lindsee Labarre/Extender: Rudene Re in Treatment: 0 Height (in): 73 Weight (lbs): 243.3 Body Mass Index (BMI): 32.1 Nutrition Risk Assessment Items NUTRITION RISK SCREEN: I have an illness or condition that made me change the kind and/or amount of 0 No food I eat I eat fewer than two meals per day 0 No I eat few fruits and vegetables, or milk products 0 No I have three or more drinks of beer, liquor or wine almost every day 0 No I have tooth or mouth problems that make it hard for me to eat 0 No I don't always have enough money to buy the food I need 0 No I eat alone most of the time 0 No I take three or more different prescribed or over-the-counter drugs a day 1 Yes Without wanting to, I have lost or gained 10 pounds in the last six months 0 No I am not always physically able to shop, cook and/or feed myself 0 No Nutrition Protocols Good Risk Protocol 0 No interventions needed Moderate Risk Protocol Electronic Signature(s) Signed: 08/08/2017 4:53:27 PM By: Alejandro Mulling Entered By: Alejandro Mulling on 08/08/2017 10:52:23

## 2017-08-10 NOTE — Progress Notes (Signed)
STANISLAW, ACTON ([id number]) Visit Report for 08/08/2017 Allergy List Details Patient Name: [name], [name] Date of Service: 08/08/2017 10:30 AM Medical Record Number: [id number] Patient Account Number: [id number] Date of Birth/Sex: 1945-11-08 (71 y.o. Male) Treating RN: [name] Primary Care [name]: [name] Other Clinician: Referring [name]: Referral, Self Treating [name]/Extender: [name] in Treatment: 0 Allergies Active Allergies NKDA Allergy Notes Electronic Signature(s) Signed: 08/08/2017 4:53:27 PM By: [name] Entered By: [name] on 08/08/2017 10:43:49 [name] ([id number]) -------------------------------------------------------------------------------- Arrival Information Details Patient Name: [name], [name] R. Date of Service: 08/08/2017 10:30 AM Medical Record Number: [id number] Patient Account Number: [id number] Date of Birth/Sex: January 08, 1946 (71 y.o. Male) Treating RN: [name] Primary Care [name]: [name] Other Clinician: Referring [name]: Referral, Self Treating [name]/Extender: [name] in Treatment: 0 Visit Information Patient Arrived: Ambulatory Arrival Time: 10:40 Accompanied By: self Transfer Assistance: None Patient Identification Verified: Yes Secondary Verification Process Yes Completed: Patient Requires Transmission-Based No Precautions: Patient Has Alerts: Yes Patient Alerts: Patient on Blood Thinner Xarelto R ABI non- compressible L ABI non- compressible Electronic Signature(s) Signed: 08/08/2017 4:53:27 PM By: [name] Entered By: [name] on 08/08/2017 11:01:42 [name] ([id number]) -------------------------------------------------------------------------------- Clinic Level of Care Assessment Details Patient Name: [name], [name] R. Date of Service: 08/08/2017 10:30 AM Medical Record Number: [id number] Patient Account Number:  [id number] Date of Birth/Sex: 11/22/1945 (71 y.o. Male) Treating RN: [name] Primary Care Emmit Oriley: [name] Other Clinician: Referring [name]: Referral, Self Treating [name]/Extender: [name] in Treatment: 0 Clinic Level of Care Assessment Items TOOL 1 Quantity Score X - Use when EandM and Procedure is performed on INITIAL visit 1 0 ASSESSMENTS - Nursing Assessment / Reassessment X - General Physical Exam (combine w/ comprehensive assessment (listed just below) when 1 20 performed on new pt. evals) X- 1 25 Comprehensive Assessment (HX, ROS, Risk Assessments, Wounds Hx, etc.) ASSESSMENTS - Wound and Skin Assessment / Reassessment []  - Dermatologic / Skin Assessment (not related to wound area) 0 ASSESSMENTS - Ostomy and/or Continence Assessment and Care []  - Incontinence Assessment and Management 0 []  - 0 Ostomy Care Assessment and Management (repouching, etc.) PROCESS - Coordination of Care []  - Simple Patient / Family Education for ongoing care 0 X- 1 20 Complex (extensive) Patient / Family Education for ongoing care X- 1 10 Staff obtains Programmer, systems, Records, Test Results / Process Orders []  - 0 Staff telephones HHA, Nursing Homes / Clarify orders / etc []  - 0 Routine Transfer to another Facility (non-emergent condition) []  - 0 Routine Hospital Admission (non-emergent condition) X- 1 15 New Admissions / Biomedical engineer / Ordering NPWT, Apligraf, etc. []  - 0 Emergency Hospital Admission (emergent condition) PROCESS - Special Needs []  - Pediatric / Minor Patient Management 0 []  - 0 Isolation Patient Management []  - 0 Hearing / Language / Visual special needs []  - 0 Assessment of Community assistance (transportation, [name], etc.) []  - 0 Additional assistance / Altered mentation []  - 0 Support Surface(s) Assessment (bed, cushion, seat, etc.) [name], [name] R. ([id number]) INTERVENTIONS - Miscellaneous []  - External ear exam  0 []  - 0 Patient Transfer (multiple staff / Civil Service fast streamer / Similar devices) []  - 0 Simple Staple / Suture removal (25 or less) []  - 0 Complex Staple / Suture removal (26 or more) []  - 0 Hypo/Hyperglycemic Management (do not check if billed separately) X- 1 15 Ankle / Brachial Index (ABI) - do not check if billed separately Has the patient been seen at  Responses: State content correctly Wound/Skin Impairment: Handouts: Other: change dressing as ordered Methods: Demonstration, Explain/Verbal Responses: State content correctly Electronic Signature(s) Signed: 08/08/2017 4:53:27 PM By: [name] Entered By: [name] on 08/08/2017 11:33:22 [name] ([id number]) -------------------------------------------------------------------------------- Wound Assessment Details Patient Name: [name], [name] R. Date of Service: 08/08/2017 10:30 AM Medical Record Number: [id number] Patient Account Number: [id number] Date of Birth/Sex: September 08, 1946 (71 y.o. Male) Treating RN: [name] Primary Care [name]: [name] Other Clinician: Referring [name]: Referral, Self Treating [name]/Extender: [name] in Treatment: 0 Wound Status Wound Number: 1 Primary Venous Leg Ulcer Etiology: Wound Location: Right Lower Leg - Anterior, Proximal Wound Status: Open Wounding Event: Gradually Appeared Comorbid Arrhythmia, Hypertension, Type II Diabetes, Date Acquired: 07/18/2017 History: Gout Weeks Of Treatment: 0 Clustered Wound: No Photos Photo Uploaded By: [name] on 08/08/2017 14:27:15 Wound Measurements Length: (cm) 0.5 Width: (cm) 0.5 Depth: (cm) 0.1 Area: (cm) 0.196 Volume: (cm) 0.02 % Reduction in Area: % Reduction in Volume: Epithelialization: None Tunneling:  No Undermining: No Wound Description Classification: Partial Thickness Wound Margin: Distinct, outline attached Exudate Amount: Large Exudate Type: Serous Exudate Color: amber Foul Odor After Cleansing: No Slough/Fibrino Yes Wound Bed Granulation Amount: None Present (0%) Necrotic Amount: Large (67-100%) Necrotic Quality: Eschar Periwound Skin Texture Texture Color No Abnormalities Noted: No No Abnormalities Noted: No Moisture Temperature / Pain No Abnormalities Noted: No Temperature: No Abnormality Liddicoat, Kjuan R. ([id number]) Tenderness on Palpation: Yes Wound Preparation Ulcer Cleansing: Rinsed/Irrigated with Saline Topical Anesthetic Applied: Other: lidocaine 4%, Treatment Notes Wound #1 (Right, Proximal, Anterior Lower Leg) 1. Cleansed with: Clean wound with Normal Saline 2. Anesthetic Topical Lidocaine 4% cream to wound bed prior to debridement 4. Dressing Applied: Medihoney Gel 5. Secondary Dressing Applied Dry [place] Notes stretch netting #5 Electronic Signature(s) Signed: 08/08/2017 4:53:27 PM By: [name] Entered By: [name] on 08/08/2017 10:57:53 LATREL, SZYMCZAK R. ([id number]) -------------------------------------------------------------------------------- Wound Assessment Details Patient Name: [name], [name] R. Date of Service: 08/08/2017 10:30 AM Medical Record Number: [id number] Patient Account Number: [id number] Date of Birth/Sex: Aug 04, 1946 (71 y.o. Male) Treating RN: [name] Primary Care Joshlyn Beadle: [name] Other Clinician: Referring Levone Otten: Referral, Self Treating Kristen Fromm/Extender: [name] in Treatment: 0 Wound Status Wound Number: 2 Primary Venous Leg Ulcer Etiology: Wound Location: Right Lower Leg - Medial, Proximal Wound Status: Open Wounding Event: Gradually Appeared Comorbid Arrhythmia, Hypertension, Type II Diabetes, Date Acquired: 07/18/2017 History: Gout Weeks Of Treatment:  0 Clustered Wound: No Photos Photo Uploaded By: [name] on 08/08/2017 14:27:16 Wound Measurements Length: (cm) 3.3 Width: (cm) 1.1 Depth: (cm) 0.1 Area: (cm) 2.851 Volume: (cm) 0.285 % Reduction in Area: % Reduction in Volume: Epithelialization: None Tunneling: No Undermining: No Wound Description Classification: Partial Thickness Wound Margin: Distinct, outline attached Exudate Amount: Large Exudate Type: Serous Exudate Color: amber Foul Odor After Cleansing: No Slough/Fibrino Yes Wound Bed Granulation Amount: None Present (0%) Necrotic Amount: Large (67-100%) Necrotic Quality: Adherent Slough Periwound Skin Texture Texture Color No Abnormalities Noted: No No Abnormalities Noted: No Moisture Temperature / Pain No Abnormalities Noted: No Temperature: No Abnormality Koehne, Tayden R. ([id number]) Maceration: Yes Tenderness on Palpation: Yes Wound Preparation Ulcer Cleansing: Rinsed/Irrigated with Saline Topical Anesthetic Applied: Other: lidocaine 4%, Electronic Signature(s) Signed: 08/08/2017 4:53:27 PM By: [name] Entered By: [name] on 08/08/2017 10:59:15 [name] ([id number]) -------------------------------------------------------------------------------- [place] Details Patient Name: [name], [name] R. Date of Service: 08/08/2017 10:30 AM Medical Record Number: [id number] Patient Account Number: [id number] Date of Birth/Sex: Jan 29, 1946 (71 y.o.  Debridement Area (sq cm): [1:[name]] [2:3.63] [[name]:[name]] Instrument: [1:[name]] [2:Curette] [[name]:[name]] Bleeding: [1:[name]] [2:Minimum] [[name]:[name]] Hemostasis Achieved: [1:[name]] [2:Pressure] [[name]:[name]] Procedural Pain: [1:[name]] [2:0] [[name]:[name]] Post Procedural Pain: [1:[name] [name]] [2:0 Procedure was tolerated well] [[name]:[name] [name]] Debridement Treatment Response: Post  Debridement [name] 3.3x1.2x0.2 [name] Measurements L x W x D (cm) Post Debridement Volume: [name] 0.622 [name] (cm) Periwound Skin Texture: No Abnormalities Noted No Abnormalities Noted [name] Periwound Skin Moisture: No Abnormalities Noted Maceration: Yes [name] Periwound Skin Color: No Abnormalities Noted No Abnormalities Noted [name] Temperature: No Abnormality No Abnormality [name] Tenderness on Palpation: Yes Yes [name] Wound Preparation: Ulcer Cleansing: Ulcer Cleansing: [name] Rinsed/Irrigated with Saline Rinsed/Irrigated with Saline Topical Anesthetic Applied: Topical Anesthetic Applied: Other: lidocaine 4% Other: lidocaine 4% Procedures Performed: [name] Debridement [name] Treatment Notes Wound #1 (Right, Proximal, Anterior Lower Leg) 1. Cleansed with: Clean wound with Normal Saline 2. Anesthetic Topical Lidocaine 4% cream to wound bed prior to debridement 4. Dressing Applied: Medihoney Gel 5. Secondary Man Notes stretch netting #5 Wound #2 (Right, Proximal, Medial Lower Leg) 1. Cleansed with: Clean wound with Normal Saline 2. Anesthetic Topical Lidocaine 4% cream to wound bed prior to debridement 4. Dressing Applied: Medihoney Gel 5. Secondary Dressing Applied Dry [place] Notes stretch netting #5 Electronic Signature(s) Signed: 08/08/2017 11:36:28 AM By: Christin Fudge MD, FACS Entered By: Christin Fudge on 08/08/2017 11:36:28 [name] ([id number]) -------------------------------------------------------------------------------- [place] Details Patient Name: [name], MINNIE. Date of Service: 08/08/2017 10:30 AM Medical Record Number: [id number] Patient Account Number: [id number] Date of Birth/Sex: 04/23/1946 (71 y.o. Male) Treating RN: [name] Primary Care Miyoshi Ligas: [name] Other Clinician: Referring Harlow Carrizales: Referral, Self Treating Randle Shatzer/Extender: [name] in Treatment: 0 Active  Inactive ` Orientation to the Wound Care Program Nursing Diagnoses: Knowledge deficit related to the wound healing center program Goals: Patient/caregiver will verbalize understanding of the [facility] Date Initiated: 08/08/2017 Target Resolution Date: 09/14/2017 Goal Status: Active Interventions: Provide education on orientation to the wound center Notes: ` Wound/Skin Impairment Nursing Diagnoses: Impaired tissue integrity Knowledge deficit related to ulceration/compromised skin integrity Goals: Ulcer/skin breakdown will have a volume reduction of 80% by week 12 Date Initiated: 08/08/2017 Target Resolution Date: 12/14/2017 Goal Status: Active Interventions: Assess patient/caregiver ability to perform ulcer/skin care regimen upon admission and as needed Assess ulceration(s) every visit Notes: Electronic Signature(s) Signed: 08/08/2017 4:53:27 PM By: [name] Entered By: [name] on 08/08/2017 11:07:07 [name] ([id number]) -------------------------------------------------------------------------------- Pain Assessment Details Patient Name: [name], [name] R. Date of Service: 08/08/2017 10:30 AM Medical Record Number: [id number] Patient Account Number: [id number] Date of Birth/Sex: 11/02/1945 (71 y.o. Male) Treating RN: [name] Primary Care Kelby Lotspeich: [name] Other Clinician: Referring Tanner Vigna: Referral, Self Treating Mckyle Solanki/Extender: [name] in Treatment: 0 Active Problems Location of Pain Severity and Description of Pain Patient Has Paino No Site Locations Pain Management and Medication Current Pain Management: Electronic Signature(s) Signed: 08/08/2017 4:53:27 PM By: [name] Entered By: [name] on 08/08/2017 10:40:53 [name] ([id number]) -------------------------------------------------------------------------------- Patient/Caregiver Education Details Patient Name: [name], RAGONE. Date of Service: 08/08/2017 10:30 AM Medical Record Number: [id number] Patient Account Number: [id number] Date of Birth/Gender: 02-Apr-1946 (71 y.o. Male) Treating RN: [name] Primary Care Physician: [name] Other Clinician: Referring Physician: Referral, Self Treating Physician/Extender: [name] in Treatment: 0 Education Assessment Education Provided To: Patient Education Topics Provided Welcome To [facility]: Handouts: Welcome To [facility] Methods: Explain/Verbal  Debridement Area (sq cm): [1:[name]] [2:3.63] [[name]:[name]] Instrument: [1:[name]] [2:Curette] [[name]:[name]] Bleeding: [1:[name]] [2:Minimum] [[name]:[name]] Hemostasis Achieved: [1:[name]] [2:Pressure] [[name]:[name]] Procedural Pain: [1:[name]] [2:0] [[name]:[name]] Post Procedural Pain: [1:[name] [name]] [2:0 Procedure was tolerated well] [[name]:[name] [name]] Debridement Treatment Response: Post  Debridement [name] 3.3x1.2x0.2 [name] Measurements L x W x D (cm) Post Debridement Volume: [name] 0.622 [name] (cm) Periwound Skin Texture: No Abnormalities Noted No Abnormalities Noted [name] Periwound Skin Moisture: No Abnormalities Noted Maceration: Yes [name] Periwound Skin Color: No Abnormalities Noted No Abnormalities Noted [name] Temperature: No Abnormality No Abnormality [name] Tenderness on Palpation: Yes Yes [name] Wound Preparation: Ulcer Cleansing: Ulcer Cleansing: [name] Rinsed/Irrigated with Saline Rinsed/Irrigated with Saline Topical Anesthetic Applied: Topical Anesthetic Applied: Other: lidocaine 4% Other: lidocaine 4% Procedures Performed: [name] Debridement [name] Treatment Notes Wound #1 (Right, Proximal, Anterior Lower Leg) 1. Cleansed with: Clean wound with Normal Saline 2. Anesthetic Topical Lidocaine 4% cream to wound bed prior to debridement 4. Dressing Applied: Medihoney Gel 5. Secondary Man Notes stretch netting #5 Wound #2 (Right, Proximal, Medial Lower Leg) 1. Cleansed with: Clean wound with Normal Saline 2. Anesthetic Topical Lidocaine 4% cream to wound bed prior to debridement 4. Dressing Applied: Medihoney Gel 5. Secondary Dressing Applied Dry [place] Notes stretch netting #5 Electronic Signature(s) Signed: 08/08/2017 11:36:28 AM By: Christin Fudge MD, FACS Entered By: Christin Fudge on 08/08/2017 11:36:28 [name] ([id number]) -------------------------------------------------------------------------------- [place] Details Patient Name: [name], MINNIE. Date of Service: 08/08/2017 10:30 AM Medical Record Number: [id number] Patient Account Number: [id number] Date of Birth/Sex: 04/23/1946 (71 y.o. Male) Treating RN: [name] Primary Care Miyoshi Ligas: [name] Other Clinician: Referring Harlow Carrizales: Referral, Self Treating Randle Shatzer/Extender: [name] in Treatment: 0 Active  Inactive ` Orientation to the Wound Care Program Nursing Diagnoses: Knowledge deficit related to the wound healing center program Goals: Patient/caregiver will verbalize understanding of the [facility] Date Initiated: 08/08/2017 Target Resolution Date: 09/14/2017 Goal Status: Active Interventions: Provide education on orientation to the wound center Notes: ` Wound/Skin Impairment Nursing Diagnoses: Impaired tissue integrity Knowledge deficit related to ulceration/compromised skin integrity Goals: Ulcer/skin breakdown will have a volume reduction of 80% by week 12 Date Initiated: 08/08/2017 Target Resolution Date: 12/14/2017 Goal Status: Active Interventions: Assess patient/caregiver ability to perform ulcer/skin care regimen upon admission and as needed Assess ulceration(s) every visit Notes: Electronic Signature(s) Signed: 08/08/2017 4:53:27 PM By: [name] Entered By: [name] on 08/08/2017 11:07:07 [name] ([id number]) -------------------------------------------------------------------------------- Pain Assessment Details Patient Name: [name], [name] R. Date of Service: 08/08/2017 10:30 AM Medical Record Number: [id number] Patient Account Number: [id number] Date of Birth/Sex: 11/02/1945 (71 y.o. Male) Treating RN: [name] Primary Care Kelby Lotspeich: [name] Other Clinician: Referring Tanner Vigna: Referral, Self Treating Mckyle Solanki/Extender: [name] in Treatment: 0 Active Problems Location of Pain Severity and Description of Pain Patient Has Paino No Site Locations Pain Management and Medication Current Pain Management: Electronic Signature(s) Signed: 08/08/2017 4:53:27 PM By: [name] Entered By: [name] on 08/08/2017 10:40:53 [name] ([id number]) -------------------------------------------------------------------------------- Patient/Caregiver Education Details Patient Name: [name], RAGONE. Date of Service: 08/08/2017 10:30 AM Medical Record Number: [id number] Patient Account Number: [id number] Date of Birth/Gender: 02-Apr-1946 (71 y.o. Male) Treating RN: [name] Primary Care Physician: [name] Other Clinician: Referring Physician: Referral, Self Treating Physician/Extender: [name] in Treatment: 0 Education Assessment Education Provided To: Patient Education Topics Provided Welcome To [facility]: Handouts: Welcome [facility] Methods: Explain/Verbal  Debridement Area (sq cm): [1:[name]] [2:3.63] [[name]:[name]] Instrument: [1:[name]] [2:Curette] [[name]:[name]] Bleeding: [1:[name]] [2:Minimum] [[name]:[name]] Hemostasis Achieved: [1:[name]] [2:Pressure] [[name]:[name]] Procedural Pain: [1:[name]] [2:0] [[name]:[name]] Post Procedural Pain: [1:[name] [name]] [2:0 Procedure was tolerated well] [[name]:[name] [name]] Debridement Treatment Response: Post  Debridement [name] 3.3x1.2x0.2 [name] Measurements L x W x D (cm) Post Debridement Volume: [name] 0.622 [name] (cm) Periwound Skin Texture: No Abnormalities Noted No Abnormalities Noted [name] Periwound Skin Moisture: No Abnormalities Noted Maceration: Yes [name] Periwound Skin Color: No Abnormalities Noted No Abnormalities Noted [name] Temperature: No Abnormality No Abnormality [name] Tenderness on Palpation: Yes Yes [name] Wound Preparation: Ulcer Cleansing: Ulcer Cleansing: [name] Rinsed/Irrigated with Saline Rinsed/Irrigated with Saline Topical Anesthetic Applied: Topical Anesthetic Applied: Other: lidocaine 4% Other: lidocaine 4% Procedures Performed: [name] Debridement [name] Treatment Notes Wound #1 (Right, Proximal, Anterior Lower Leg) 1. Cleansed with: Clean wound with Normal Saline 2. Anesthetic Topical Lidocaine 4% cream to wound bed prior to debridement 4. Dressing Applied: Medihoney Gel 5. Secondary Man Notes stretch netting #5 Wound #2 (Right, Proximal, Medial Lower Leg) 1. Cleansed with: Clean wound with Normal Saline 2. Anesthetic Topical Lidocaine 4% cream to wound bed prior to debridement 4. Dressing Applied: Medihoney Gel 5. Secondary Dressing Applied Dry [place] Notes stretch netting #5 Electronic Signature(s) Signed: 08/08/2017 11:36:28 AM By: Christin Fudge MD, FACS Entered By: Christin Fudge on 08/08/2017 11:36:28 [name] ([id number]) -------------------------------------------------------------------------------- [place] Details Patient Name: [name], MINNIE. Date of Service: 08/08/2017 10:30 AM Medical Record Number: [id number] Patient Account Number: [id number] Date of Birth/Sex: 04/23/1946 (71 y.o. Male) Treating RN: [name] Primary Care Miyoshi Ligas: [name] Other Clinician: Referring Harlow Carrizales: Referral, Self Treating Randle Shatzer/Extender: [name] in Treatment: 0 Active  Inactive ` Orientation to the Wound Care Program Nursing Diagnoses: Knowledge deficit related to the wound healing center program Goals: Patient/caregiver will verbalize understanding of the [facility] Date Initiated: 08/08/2017 Target Resolution Date: 09/14/2017 Goal Status: Active Interventions: Provide education on orientation to the wound center Notes: ` Wound/Skin Impairment Nursing Diagnoses: Impaired tissue integrity Knowledge deficit related to ulceration/compromised skin integrity Goals: Ulcer/skin breakdown will have a volume reduction of 80% by week 12 Date Initiated: 08/08/2017 Target Resolution Date: 12/14/2017 Goal Status: Active Interventions: Assess patient/caregiver ability to perform ulcer/skin care regimen upon admission and as needed Assess ulceration(s) every visit Notes: Electronic Signature(s) Signed: 08/08/2017 4:53:27 PM By: [name] Entered By: [name] on 08/08/2017 11:07:07 [name] ([id number]) -------------------------------------------------------------------------------- Pain Assessment Details Patient Name: [name], [name] R. Date of Service: 08/08/2017 10:30 AM Medical Record Number: [id number] Patient Account Number: [id number] Date of Birth/Sex: 11/02/1945 (71 y.o. Male) Treating RN: [name] Primary Care Kelby Lotspeich: [name] Other Clinician: Referring Tanner Vigna: Referral, Self Treating Mckyle Solanki/Extender: [name] in Treatment: 0 Active Problems Location of Pain Severity and Description of Pain Patient Has Paino No Site Locations Pain Management and Medication Current Pain Management: Electronic Signature(s) Signed: 08/08/2017 4:53:27 PM By: [name] Entered By: [name] on 08/08/2017 10:40:53 [name] ([id number]) -------------------------------------------------------------------------------- Patient/Caregiver Education Details Patient Name: [name], RAGONE. Date of Service: 08/08/2017 10:30 AM Medical Record Number: [id number] Patient Account Number: [id number] Date of Birth/Gender: 02-Apr-1946 (71 y.o. Male) Treating RN: [name] Primary Care Physician: [name] Other Clinician: Referring Physician: Referral, Self Treating Physician/Extender: [name] in Treatment: 0 Education Assessment Education Provided To: Patient Education Topics Provided Welcome [facility]: Handouts: [facility] Methods: Explain/Verbal

## 2017-08-10 NOTE — Progress Notes (Signed)
[name], [name] ([id number]) Visit Report for 08/08/2017 Chief Complaint Document Details Patient Name: [name], [name] Date of Service: 08/08/2017 10:30 AM Medical Record Number: [id number] Patient Account Number: [id number] Date of Birth/Sex: 11/01/1945 (71 y.o. Male) Treating RN: Ahmed Prima Primary Care Provider: [name] Other Clinician: Referring Provider: Referral, Self Treating Provider/Extender: Frann Rider in Treatment: 0 Information Obtained from: Patient Chief Complaint Patient seen for complaints of Non-Healing Wound to the right lower extremity medial calf area which he's had for about 3 weeks Electronic Signature(s) Signed: 08/08/2017 11:37:32 AM By: Christin Fudge MD, FACS Entered By: Christin Fudge on 08/08/[address], [place]ley R. ([id number]) -------------------------------------------------------------------------------- Debridement Details Patient Name: [name], [name] Date of Service: 08/08/2017 10:30 AM Medical Record Number: [id number] Patient Account Number: [id number] Date of Birth/Sex: 06/06/1946 (71 y.o. Male) Treating RN: Ahmed Prima Primary Care Provider: [name] Other Clinician: Referring Provider: Referral, Self Treating Provider/Extender: Frann Rider in Treatment: 0 Debridement Performed for Wound #2 Right,Proximal,Medial Lower Leg Assessment: Performed By: Physician Christin Fudge, MD Debridement: Debridement Severity of Tissue Pre Fat layer exposed Debridement: Pre-procedure Verification/Time Yes - 11:08 Out Taken: Start Time: 11:09 Pain Control: Lidocaine 4% Topical Solution Level: Skin/Subcutaneous Tissue Total Area Debrided (L x W): 3.3 (cm) x 1.1 (cm) = 3.63 (cm) Tissue and other material Viable, Non-Viable, Exudate, Fibrin/Slough, Subcutaneous debrided: Instrument: Curette Bleeding: Minimum Hemostasis Achieved: Pressure End Time: 11:11 Procedural Pain: 0 Post Procedural Pain:  0 Response to Treatment: Procedure was tolerated well Post Debridement Measurements of Total Wound Length: (cm) 3.3 Width: (cm) 1.2 Depth: (cm) 0.2 Volume: (cm) 0.622 Character of Wound/Ulcer Post Debridement: Requires Further Debridement Severity of Tissue Post Debridement: Fat layer exposed Post Procedure Diagnosis Same as Pre-procedure Electronic Signature(s) Signed: 08/08/2017 11:36:37 AM By: Christin Fudge MD, FACS Signed: 08/08/2017 4:53:27 PM By: Alric Quan Entered By: Christin Fudge on 08/08/2017 11:36:36 [name] ([id number]) -------------------------------------------------------------------------------- HPI Details Patient Name: [name], [name] Date of Service: 08/08/2017 10:30 AM Medical Record Number: [id number] Patient Account Number: [id number] Date of Birth/Sex: [birth year] (71 y.o. Male) Treating RN: Ahmed Prima Primary Care Provider: [name] Other Clinician: Referring Provider: Referral, Self Treating Provider/Extender: Frann Rider in Treatment: 0 History of Present Illness Location: right upper calf medially Quality: Patient reports experiencing a sharp pain to affected area(s). Severity: Patient states wound are getting worse. Duration: Patient has had the wound for < 3 weeks prior to presenting for treatment Timing: Pain in wound is Intermittent (comes and goes Context: The wound would happen gradually Modifying Factors: Other treatment(s) tried include:local care with an antibiotic ointment Associated Signs and Symptoms: Patient reports having increase swelling. HPI Description: 71 year old male who has a past medical history of essential hypertension, chronic atrial fibrillation, peripheral vascular disease, nonischemic cardiomyopathy,venous stasis dermatitis, gouty arthropathy, basal cell carcinoma of the right lower extremity, benign prostatic hypertrophy, long-term use of anticoagulation therapy, hyperglycemia and  exercise intolerance has never been a smoker. the patient has had a vascular workup over 7 years ago and said everything was normal at that stage. He does not have any chronic problems except for cardiac issues which he sees a cardiologist in [place]. Electronic Signature(s) Signed: 08/08/2017 11:38:53 AM By: Christin Fudge MD, FACS Previous Signature: 08/08/2017 10:54:56 AM Version By: Christin Fudge MD, FACS Entered By: Christin Fudge on 08/08/2017 11:38:52 WELLES, WALTHALL ([id number]) -------------------------------------------------------------------------------- Physical Exam Details Patient Name: [name], [name] Date of Service: 08/08/2017 10:30 AM Medical Record Number: [id number] Patient Account Number: [id number] Date of Birth/Sex:  Feb 10, 1946 (72 y.o. Male) Treating RN: Ahmed Prima Primary Care Provider: [name] Other Clinician: Referring Provider: Referral, Self Treating Provider/Extender: Frann Rider in Treatment: 0 Constitutional . Pulse regular. Respirations normal and unlabored. Afebrile. . Eyes Nonicteric. Reactive to light. Ears, Nose, Mouth, and Throat Lips, teeth, and gums WNL.Marland Kitchen Moist mucosa without lesions. Neck supple and nontender. No palpable supraclavicular or cervical adenopathy. Normal sized without goiter. Respiratory WNL. No retractions.. Cardiovascular Pedal Pulses WNL. ABIs were noncompressible. No clubbing, cyanosis or edema. Gastrointestinal (GI) Abdomen without masses or tenderness.. No liver or spleen enlargement or tenderness.. Lymphatic No adneopathy. No adenopathy. No adenopathy. Musculoskeletal Adexa without tenderness or enlargement.. Digits and nails w/o clubbing, cyanosis, infection, petechiae, ischemia, or inflammatory conditions.. Integumentary (Hair, Skin) No suspicious lesions. No crepitus or fluctuance. No peri-wound warmth or erythema. No masses.Marland Kitchen Psychiatric Judgement and insight Intact.. No evidence of  depression, anxiety, or agitation.. Notes the patient has stigmata of venous disease bilaterally with stage I lymphedema also present. He has got few areas on the upper medial calf which are not typical of venous disease. The most distal 1 needed chap debridement with a #3 curet and minimal bleeding controlled with pressure Electronic Signature(s) Signed: 08/08/2017 11:39:49 AM By: Christin Fudge MD, FACS Entered By: Christin Fudge on 08/08/2017 11:39:48 [name] ([id number]) -------------------------------------------------------------------------------- Physician Orders Details Patient Name: ANIEL, HUBBLE R. Date of Service: 08/08/2017 10:30 AM Medical Record Number: [id number] Patient Account Number: [id number] Date of Birth/Sex: 1946/05/25 (71 y.o. Male) Treating RN: Ahmed Prima Primary Care Provider: [name] Other Clinician: Referring Provider: Referral, Self Treating Provider/Extender: Frann Rider in Treatment: 0 Verbal / Phone Orders: Yes ClinicianCarolyne Fiscal, Debi Read Back and Verified: Yes Diagnosis Coding Wound Cleansing Wound #1 Right,Proximal,Anterior Lower Leg o Clean wound with Normal Saline. o Cleanse wound with mild soap and water o May Shower, gently pat wound dry prior to applying new dressing. Wound #2 Right,Proximal,Medial Lower Leg o Clean wound with Normal Saline. o Cleanse wound with mild soap and water o May Shower, gently pat wound dry prior to applying new dressing. Anesthetic Wound #1 Right,Proximal,Anterior Lower Leg o Topical Lidocaine 4% cream applied to wound bed prior to debridement Wound #2 Right,Proximal,Medial Lower Leg o Topical Lidocaine 4% cream applied to wound bed prior to debridement Primary Wound Dressing Wound #1 Right,Proximal,Anterior Lower Leg o Medihoney gel Wound #2 Right,Proximal,Medial Lower Leg o Medihoney gel Secondary Dressing Wound #1 Right,Proximal,Anterior Lower Leg o Dry  Gauze o Other - telfa island, stretch netting #5 Wound #2 Right,Proximal,Medial Lower Leg o Dry Gauze o Other - telfa island, stretch netting #5 Dressing Change Frequency Wound #1 Right,Proximal,Anterior Lower Leg o Change dressing every day. Wound #2 Right,Proximal,Medial Lower Leg o Change dressing every day. Follow-up Appointments Wound #1 Right,Proximal,Anterior Lower Leg NAYSHAWN, MESTA R. ([id number]) o Return Appointment in 1 week. Wound #2 Right,Proximal,Medial Lower Leg o Return Appointment in 1 week. Edema Control Wound #1 Right,Proximal,Anterior Lower Leg o Elevate legs to the level of the heart and pump ankles as often as possible Wound #2 Right,Proximal,Medial Lower Leg o Elevate legs to the level of the heart and pump ankles as often as possible Additional Orders / Instructions Wound #1 Right,Proximal,Anterior Lower Leg o Increase protein intake. Wound #2 Right,Proximal,Medial Lower Leg o Increase protein intake. Medications-please add to medication list. Wound #1 Right,Proximal,Anterior Lower Leg o Other: - Vitamin C, Zinc, MVI Wound #2 Right,Proximal,Medial Lower Leg o Other: - Vitamin C, Zinc, MVI Services and Therapies o Arterial Studies- Bilateral -  AVVS o Venous Studies -Bilateral - AVVS Electronic Signature(s) Signed: 08/08/2017 4:34:23 PM By: Christin Fudge MD, FACS Signed: 08/08/2017 4:53:27 PM By: Alric Quan Entered By: Alric Quan on 08/08/2017 11:24:51 KASPER, MUDRICK ([id number]) -------------------------------------------------------------------------------- Problem List Details Patient Name: NIVIN, BRANIFF R. Date of Service: 08/08/2017 10:30 AM Medical Record Number: [id number] Patient Account Number: [id number] Date of Birth/Sex: [birth year] (71 y.o. Male) Treating RN: Ahmed Prima Primary Care Provider: [name] Other Clinician: Referring Provider: Referral, Self Treating Provider/Extender:  Frann Rider in Treatment: 0 Active Problems ICD-10 Encounter Code Description Active Date Diagnosis L97.212 Non-pressure chronic ulcer of right calf with fat layer exposed 08/08/2017 Yes I89.0 Lymphedema, not elsewhere classified 08/08/2017 Yes I73.9 Peripheral vascular disease, unspecified 08/08/2017 Yes Inactive Problems Resolved Problems Electronic Signature(s) Signed: 08/08/2017 11:34:58 AM By: Christin Fudge MD, FACS Entered By: Christin Fudge on 08/08/2017 11:34:57 [name] ([id number]) -------------------------------------------------------------------------------- Progress Note Details Patient Name: [name] Date of Service: 08/08/2017 10:30 AM Medical Record Number: [id number] Patient Account Number: [id number] Date of Birth/Sex: 01-21-1946 (71 y.o. Male) Treating RN: Ahmed Prima Primary Care Provider: [name] Other Clinician: Referring Provider: Referral, Self Treating Provider/Extender: Frann Rider in Treatment: 0 Subjective Chief Complaint Information obtained from Patient Patient seen for complaints of Non-Healing Wound to the right lower extremity medial calf area which he's had for about 3 weeks History of Present Illness (HPI) The following HPI elements were documented for the patient's wound: Location: right upper calf medially Quality: Patient reports experiencing a sharp pain to affected area(s). Severity: Patient states wound are getting worse. Duration: Patient has had the wound for < 3 weeks prior to presenting for treatment Timing: Pain in wound is Intermittent (comes and goes Context: The wound would happen gradually Modifying Factors: Other treatment(s) tried include:local care with an antibiotic ointment Associated Signs and Symptoms: Patient reports having increase swelling. 71 year old male who has a past medical history of essential hypertension, chronic atrial fibrillation, peripheral vascular disease,  nonischemic cardiomyopathy,venous stasis dermatitis, gouty arthropathy, basal cell carcinoma of the right lower extremity, benign prostatic hypertrophy, long-term use of anticoagulation therapy, hyperglycemia and exercise intolerance has never been a smoker. the patient has had a vascular workup over 7 years ago and said everything was normal at that stage. He does not have any chronic problems except for cardiac issues which he sees a cardiologist in [place]. Wound History Patient presents with 2 open wounds that have been present for approximately 3 weeks. Patient has been treating wounds in the following manner: vaseline. Laboratory tests have not been performed in the last month. Patient reportedly has not tested positive for an antibiotic resistant organism. Patient reportedly has not tested positive for osteomyelitis. Patient reportedly has had testing performed to evaluate circulation in the legs. Patient experiences the following problems associated with their wounds: swelling. Patient History Information obtained from Patient. Allergies NKDA Family History Cancer - Father, Diabetes - Paternal Grandparents, Heart Disease - Father, Hypertension - Mother,Father,Siblings, No family history of Hereditary Spherocytosis, Kidney Disease, Lung Disease, Seizures, Stroke, Thyroid Problems, Tuberculosis. Social History Never smoker, Marital Status - Divorced, Alcohol Use - Rarely, Drug Use - No History, Caffeine Use - Never. JEFFREY, GRAEFE ([id number]) Medical History Cardiovascular Patient has history of Arrhythmia - a-fib, Hypertension Endocrine Patient has history of Type II Diabetes Musculoskeletal Patient has history of Gout Patient is treated with Controlled Diet. Review of Systems (ROS) Constitutional Symptoms (General Health) The patient has no complaints or symptoms. Eyes The patient has no complaints  or symptoms. Ear/Nose/Mouth/Throat The patient has no complaints or  symptoms. Hematologic/Lymphatic The patient has no complaints or symptoms. Respiratory The patient has no complaints or symptoms. Gastrointestinal The patient has no complaints or symptoms. Genitourinary BPH erectile dysfunction Immunological The patient has no complaints or symptoms. Integumentary (Skin) Complains or has symptoms of Wounds. Neurologic The patient has no complaints or symptoms. Oncologic The patient has no complaints or symptoms. Psychiatric The patient has no complaints or symptoms. Medications carvedilol 12.5 mg tablet oral 1 1 tablet oral two times daily benazepril 20 mg tablet oral 1 1 tablet oral two times daily Xarelto 20 mg tablet oral 1 1 tablet oral daily multivitamin tablet oral 1 1 tablet oral daily chlorthalidone 25 mg tablet oral 1 1 tablet oral daily Objective Constitutional Pulse regular. Respirations normal and unlabored. Afebrile. Vitals Time Taken: 10:40 AM, Height: 73 in, Source: Stated, Weight: 243.3 lbs, Source: Measured, BMI: 32.1, Temperature: Yaworski, Tae R. ([id number]) 98.3 F, Pulse: 77 bpm, Respiratory Rate: 20 breaths/min, Blood Pressure: 136/84 mmHg. Eyes Nonicteric. Reactive to light. Ears, Nose, Mouth, and Throat Lips, teeth, and gums WNL.Marland Kitchen Moist mucosa without lesions. Neck supple and nontender. No palpable supraclavicular or cervical adenopathy. Normal sized without goiter. Respiratory WNL. No retractions.. Cardiovascular Pedal Pulses WNL. ABIs were noncompressible. No clubbing, cyanosis or edema. Gastrointestinal (GI) Abdomen without masses or tenderness.. No liver or spleen enlargement or tenderness.. Lymphatic No adneopathy. No adenopathy. No adenopathy. Musculoskeletal Adexa without tenderness or enlargement.. Digits and nails w/o clubbing, cyanosis, infection, petechiae, ischemia, or inflammatory conditions.Marland Kitchen Psychiatric Judgement and insight Intact.. No evidence of depression, anxiety, or agitation.. General  Notes: the patient has stigmata of venous disease bilaterally with stage I lymphedema also present. He has got few areas on the upper medial calf which are not typical of venous disease. The most distal 1 needed chap debridement with a #3 curet and minimal bleeding controlled with pressure Integumentary (Hair, Skin) No suspicious lesions. No crepitus or fluctuance. No peri-wound warmth or erythema. No masses.. Wound #1 status is Open. Original cause of wound was Gradually Appeared. The wound is located on the Right,Proximal,Anterior Lower Leg. The wound measures 0.5cm length x 0.5cm width x 0.1cm depth; 0.196cm^2 area and 0.02cm^3 volume. There is no tunneling or undermining noted. There is a large amount of serous drainage noted. The wound margin is distinct with the outline attached to the wound base. There is no granulation within the wound bed. There is a large (67-100%) amount of necrotic tissue within the wound bed including Eschar. Periwound temperature was noted as No Abnormality. The periwound has tenderness on palpation. Wound #2 status is Open. Original cause of wound was Gradually Appeared. The wound is located on the Right,Distal,Anterior Lower Leg. The wound measures 3.3cm length x 1.1cm width x 0.1cm depth; 2.851cm^2 area and 0.285cm^3 volume. There is no tunneling or undermining noted. There is a large amount of serous drainage noted. The wound margin is distinct with the outline attached to the wound base. There is no granulation within the wound bed. There is a large (67-100%) amount of necrotic tissue within the wound bed including Adherent Slough. The periwound skin appearance exhibited: Maceration. Periwound temperature was noted as No Abnormality. The periwound has tenderness on palpation. Assessment ABDULKAREEM, BADOLATO ([id number]) Active Problems ICD-10 [phone] - Non-pressure chronic ulcer of right calf with fat layer exposed I89.0 - Lymphedema, not elsewhere  classified I73.9 - Peripheral vascular disease, unspecified 71 year old gentleman with insidious onset of ulceration on the right upper medial  calf, not typical of venous disease. He does however have stigmata of lymphedema and hemosiderin deposits in his skin. After review I have recommended: 1. MediHoney ointment locally to be applied daily after washing with soap and water 2. Elevation and exercise 3. Arterial duplex and venous reflux studies both lower extremities 4. Regular visits to the wound center Procedures Wound #2 Pre-procedure diagnosis of Wound #2 is a Venous Leg Ulcer located on the Right,Proximal,Medial Lower Leg .Severity of Tissue Pre Debridement is: Fat layer exposed. There was a Skin/Subcutaneous Tissue Debridement ([id number]) debridement with total area of 3.63 sq cm performed by Christin Fudge, MD. with the following instrument(s): Curette to remove Viable and Non-Viable tissue/material including Exudate, Fibrin/Slough, and Subcutaneous after achieving pain control using Lidocaine 4% Topical Solution. A time out was conducted at 11:08, prior to the start of the procedure. A Minimum amount of bleeding was controlled with Pressure. The procedure was tolerated well with a pain level of 0 throughout and a pain level of 0 following the procedure. Post Debridement Measurements: 3.3cm length x 1.2cm width x 0.2cm depth; 0.622cm^3 volume. Character of Wound/Ulcer Post Debridement requires further debridement. Severity of Tissue Post Debridement is: Fat layer exposed. Post procedure Diagnosis Wound #2: Same as Pre-Procedure Plan Wound Cleansing: Wound #1 Right,Proximal,Anterior Lower Leg: Clean wound with Normal Saline. Cleanse wound with mild soap and water May Shower, gently pat wound dry prior to applying new dressing. Wound #2 Right,Proximal,Medial Lower Leg: Clean wound with Normal Saline. Cleanse wound with mild soap and water May Shower, gently pat wound dry prior to  applying new dressing. Anesthetic: Wound #1 Right,Proximal,Anterior Lower Leg: Topical Lidocaine 4% cream applied to wound bed prior to debridement Wound #2 Right,Proximal,Medial Lower Leg: Topical Lidocaine 4% cream applied to wound bed prior to debridement Primary Wound Dressing: Wound #1 Right,Proximal,Anterior Lower Leg: Medihoney gel MARICELA, KAWAHARA ([id number]) Wound #2 Right,Proximal,Medial Lower Leg: Medihoney gel Secondary Dressing: Wound #1 Right,Proximal,Anterior Lower Leg: Dry Gauze Other - telfa island, stretch netting #5 Wound #2 Right,Proximal,Medial Lower Leg: Dry Gauze Other - telfa island, stretch netting #5 Dressing Change Frequency: Wound #1 Right,Proximal,Anterior Lower Leg: Change dressing every day. Wound #2 Right,Proximal,Medial Lower Leg: Change dressing every day. Follow-up Appointments: Wound #1 Right,Proximal,Anterior Lower Leg: Return Appointment in 1 week. Wound #2 Right,Proximal,Medial Lower Leg: Return Appointment in 1 week. Edema Control: Wound #1 Right,Proximal,Anterior Lower Leg: Elevate legs to the level of the heart and pump ankles as often as possible Wound #2 Right,Proximal,Medial Lower Leg: Elevate legs to the level of the heart and pump ankles as often as possible Additional Orders / Instructions: Wound #1 Right,Proximal,Anterior Lower Leg: Increase protein intake. Wound #2 Right,Proximal,Medial Lower Leg: Increase protein intake. Medications-please add to medication list.: Wound #1 Right,Proximal,Anterior Lower Leg: Other: - Vitamin C, Zinc, MVI Wound #2 Right,Proximal,Medial Lower Leg: Other: - Vitamin C, Zinc, MVI Services and Therapies ordered were: Arterial Studies- Bilateral - [facility], Venous Studies -Bilateral - AVVS 71 year old gentleman with insidious onset of ulceration on the right upper medial calf, not typical of venous disease. He does however have stigmata of lymphedema and hemosiderin deposits in his skin. After  review I have recommended: 1. MediHoney ointment locally to be applied daily after washing with soap and water 2. Elevation and exercise 3. Arterial duplex and venous reflux studies both lower extremities 4. Regular visits to the wound center Electronic Signature(s) Signed: 08/08/2017 11:43:48 AM By: Christin Fudge MD, FACS Entered By: Christin Fudge on 08/08/2017 11:43:48 [name] ([id number]) --------------------------------------------------------------------------------  ROS/PFSH Details Patient Name: LUIS, NICKLES. Date of Service: 08/08/2017 10:30 AM Medical Record Number: [id number] Patient Account Number: [id number] Date of Birth/Sex: 10-Oct-1945 (71 y.o. Male) Treating RN: Ahmed Prima Primary Care Provider: [name] Other Clinician: Referring Provider: Referral, Self Treating Provider/Extender: Frann Rider in Treatment: 0 Information Obtained From Patient Wound History Do you currently have one or more open woundso Yes How many open wounds do you currently haveo 2 Approximately how long have you had your woundso 3 weeks How have you been treating your wound(s) until nowo vaseline Has your wound(s) ever healed and then re-openedo No Have you had any lab work done in the past montho No Have you tested positive for an antibiotic resistant organism (MRSA, VRE)o No Have you tested positive for osteomyelitis (bone infection)o No Have you had any tests for circulation on your legso Yes Where was the test done[facility] Have you had other problems associated with your woundso Swelling Integumentary (Skin) Complaints and Symptoms: Positive for: Wounds Constitutional Symptoms (General Health) Complaints and Symptoms: No Complaints or Symptoms Eyes Complaints and Symptoms: No Complaints or Symptoms Ear/Nose/Mouth/Throat Complaints and Symptoms: No Complaints or Symptoms Hematologic/Lymphatic Complaints and Symptoms: No Complaints or  Symptoms Respiratory Complaints and Symptoms: No Complaints or Symptoms Cardiovascular TYLYN, DERWIN. ([id number]) Medical History: Positive for: Arrhythmia - a-fib; Hypertension Gastrointestinal Complaints and Symptoms: No Complaints or Symptoms Endocrine Medical History: Positive for: Type II Diabetes Treated with: Diet Genitourinary Complaints and Symptoms: Review of System Notes: BPH erectile dysfunction Immunological Complaints and Symptoms: No Complaints or Symptoms Musculoskeletal Medical History: Positive for: Gout Neurologic Complaints and Symptoms: No Complaints or Symptoms Oncologic Complaints and Symptoms: No Complaints or Symptoms Psychiatric Complaints and Symptoms: No Complaints or Symptoms Immunizations Pneumococcal Vaccine: Received Pneumococcal Vaccination: Yes Implantable Devices Family and Social History Cancer: Yes - Father; Diabetes: Yes - Paternal Grandparents; Heart Disease: Yes - Father; Hereditary Spherocytosis: No; Hypertension: Yes - Mother,Father,Siblings; Kidney Disease: No; Lung Disease: No; Seizures: No; Stroke: No; Thyroid Problems: No; Tuberculosis: No; Never smoker; Marital Status - Divorced; Alcohol Use: Rarely; Drug Use: No History; Caffeine Use: Never; Financial Concerns: No; Food, Clothing or Shelter Needs: No; Support System Lacking: No; AMMAAR, ENCINA R. ([id number]) Transportation Concerns: No; Advanced Directives: No; Patient does not want information on Advanced Directives; Do not resuscitate: No; Living Will: No; Medical Power of Attorney: No Physician Affirmation I have reviewed and agree with the above information. Electronic Signature(s) Signed: 08/08/2017 4:34:23 PM By: Christin Fudge MD, FACS Signed: 08/08/2017 4:53:27 PM By: Alric Quan Entered By: Christin Fudge on 08/08/2017 11:05:29 [name] ([id number]) -------------------------------------------------------------------------------- SuperBill  Details Patient Name: BURDETT, PINZON R. Date of Service: 08/08/2017 Medical Record Number: [id number] Patient Account Number: [id number] Date of Birth/Sex: 25-Apr-1946 (71 y.o. Male) Treating RN: Ahmed Prima Primary Care Provider: [name] Other Clinician: Referring Provider: Referral, Self Treating Provider/Extender: Frann Rider in Treatment: 0 Diagnosis Coding ICD-10 Codes Code Description [phone] Non-pressure chronic ulcer of right calf with fat layer exposed I89.0 Lymphedema, not elsewhere classified I73.9 Peripheral vascular disease, unspecified Facility Procedures CPT4 Code: 92426834 Description: 99213 - WOUND CARE VISIT-LEV 3 EST PT Modifier: Quantity: 1 CPT4 Code: 19622297 Description: 11042 - DEB SUBQ TISSUE 20 SQ CM/< ICD-10 Diagnosis Description L97.212 Non-pressure chronic ulcer of right calf with fat layer expo I89.0 Lymphedema, not elsewhere classified I73.9 Peripheral vascular disease, unspecified Modifier: sed Quantity: 1 Physician Procedures CPT4 Code: 9892119 Description: 41740 - WC PHYS LEVEL 4 - NEW PT ICD-10 Diagnosis Description L97.212  Non-pressure chronic ulcer of right calf with fat layer exp I89.0 Lymphedema, not elsewhere classified I73.9 Peripheral vascular disease, unspecified Modifier: 25 osed Quantity: 1 CPT4 Code: 5947076 Description: 15183 - WC PHYS SUBQ TISS 20 SQ CM ICD-10 Diagnosis Description L97.212 Non-pressure chronic ulcer of right calf with fat layer exp I89.0 Lymphedema, not elsewhere classified I73.9 Peripheral vascular disease, unspecified Modifier: osed Quantity: 1 Electronic Signature(s) Signed: 08/08/2017 11:56:14 AM By: Alric Quan Signed: 08/08/2017 4:34:23 PM By: Christin Fudge MD, FACS Previous Signature: 08/08/2017 11:44:09 AM Version By: Christin Fudge MD, FACS Entered By: Alric Quan on 08/08/2017 11:56:13

## 2017-08-13 ENCOUNTER — Other Ambulatory Visit (INDEPENDENT_AMBULATORY_CARE_PROVIDER_SITE_OTHER): Payer: Medicare Other

## 2017-08-13 ENCOUNTER — Other Ambulatory Visit: Payer: Self-pay | Admitting: Surgery

## 2017-08-13 DIAGNOSIS — L97212 Non-pressure chronic ulcer of right calf with fat layer exposed: Secondary | ICD-10-CM | POA: Diagnosis not present

## 2017-08-15 ENCOUNTER — Encounter: Payer: Medicare Other | Admitting: Surgery

## 2017-08-15 ENCOUNTER — Other Ambulatory Visit: Payer: Self-pay | Admitting: Cardiology

## 2017-08-15 DIAGNOSIS — L97212 Non-pressure chronic ulcer of right calf with fat layer exposed: Secondary | ICD-10-CM | POA: Diagnosis not present

## 2017-08-15 DIAGNOSIS — I10 Essential (primary) hypertension: Secondary | ICD-10-CM

## 2017-08-18 NOTE — Progress Notes (Signed)
ZAHKI, HOOGENDOORN ([id number]) Visit Report for 08/15/2017 Chief Complaint Document Details Patient Name: [name], [name] Date of Service: 08/15/2017 1:30 PM Medical Record Number: [id number] Patient Account Number: [id number] Date of Birth/Sex: May 24, 1946 (71 y.o. Male) Treating RN: Ahmed Prima Primary Care Provider: [name] Other Clinician: Referring Provider: [name] Treating Provider/Extender: Frann Rider in Treatment: 1 Information Obtained from: Patient Chief Complaint Patient seen for complaints of Non-Healing Wound to the right lower extremity medial calf area which he's had for about 3 weeks Electronic Signature(s) Signed: 08/15/2017 2:12:12 PM By: Christin Fudge MD, FACS Entered By: Christin Fudge on 08/15/2017 14:12:12 [name] ([id number]) -------------------------------------------------------------------------------- Debridement Details Patient Name: [name], [name] Date of Service: 08/15/2017 1:30 PM Medical Record Number: [id number] Patient Account Number: [id number] Date of Birth/Sex: 11-17-1945 (72 y.o. Male) Treating RN: Ahmed Prima Primary Care Provider: [name] Other Clinician: Referring Provider: [name] Treating Provider/Extender: Frann Rider in Treatment: 1 Debridement Performed for Wound #2 Right,Distal,Anterior Lower Leg Assessment: Performed By: Physician Christin Fudge, MD Debridement: Debridement Severity of Tissue Pre Fat layer exposed Debridement: Pre-procedure Verification/Time Yes - 13:51 Out Taken: Start Time: 13:52 Pain Control: Lidocaine 4% Topical Solution Level: Skin/Subcutaneous Tissue Total Area Debrided (L x W): 2 (cm) x 1.2 (cm) = 2.4 (cm) Tissue and other material Viable, Non-Viable, Exudate, Fibrin/Slough, Subcutaneous debrided: Instrument: Curette Bleeding: Minimum Hemostasis Achieved: Pressure End Time: 13:54 Procedural Pain: 0 Post Procedural Pain: 0 Response to  Treatment: Procedure was tolerated well Post Debridement Measurements of Total Wound Length: (cm) 2 Width: (cm) 1.2 Depth: (cm) 0.3 Volume: (cm) 0.565 Character of Wound/Ulcer Post Debridement: Requires Further Debridement Severity of Tissue Post Debridement: Fat layer exposed Post Procedure Diagnosis Same as Pre-procedure Electronic Signature(s) Signed: 08/15/2017 2:12:06 PM By: Christin Fudge MD, FACS Signed: 08/16/2017 4:36:47 PM By: Alric Quan Entered By: Christin Fudge on 08/15/2017 14:12:06 [name] ([id number]) -------------------------------------------------------------------------------- HPI Details Patient Name: [name], [name] Date of Service: 08/15/2017 1:30 PM Medical Record Number: [id number] Patient Account Number: [id number] Date of Birth/Sex: August 09, 1946 (71 y.o. Male) Treating RN: Ahmed Prima Primary Care Provider: [name] Other Clinician: Referring Provider: [name] Treating Provider/Extender: Frann Rider in Treatment: 1 History of Present Illness Location: right upper calf medially Quality: Patient reports experiencing a sharp pain to affected area(s). Severity: Patient states wound are getting worse. Duration: Patient has had the wound for < 3 weeks prior to presenting for treatment Timing: Pain in wound is Intermittent (comes and goes Context: The wound would happen gradually Modifying Factors: Other treatment(s) tried include:local care with an antibiotic ointment Associated Signs and Symptoms: Patient reports having increase swelling. 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 the patient has had a vascular workup over 7 years ago and said everything was normal at that stage. He does not have any chronic problems except for cardiac issues which he sees a cardiologist in [place]. 08/15/2017 -- arterial and venous duplex studies still pending Electronic Signature(s) Signed: 08/15/2017 2:12:28 PM By: Christin Fudge MD, FACS Entered By: Christin Fudge on 08/15/2017 14:12:28 [name] ([id number]) -------------------------------------------------------------------------------- Physical Exam Details Patient Name: [name], [name] Date of Service: 08/15/2017 1:30 PM Medical Record Number: [id number] Patient Account Number: [id number] Date of Birth/Sex: 02/01/1946 (71  y.o. Male) Treating RN: Carolyne Fiscal, Debi Primary Care Provider: [name] Other Clinician: Referring Provider: [name] Treating Provider/Extender: Frann Rider in Treatment: 1 Constitutional . Pulse regular. Respirations normal and unlabored. Afebrile. . Eyes Nonicteric. Reactive to light. Ears, Nose, Mouth, and Throat Lips, teeth, and gums WNL.Marland Kitchen Moist mucosa without lesions. Neck supple and nontender. No palpable supraclavicular or cervical adenopathy. Normal sized without goiter. Respiratory WNL. No retractions.. Cardiovascular Pedal Pulses WNL. No clubbing, cyanosis or edema. Lymphatic No adneopathy. No adenopathy. No adenopathy. Musculoskeletal Adexa without tenderness or enlargement.. Digits and nails w/o clubbing, cyanosis, infection, petechiae, ischemia, or inflammatory conditions.. Integumentary (Hair, Skin) No suspicious lesions. No crepitus or fluctuance. No peri-wound warmth or erythema. No masses.Marland Kitchen Psychiatric Judgement and insight Intact.. No evidence of depression, anxiety, or agitation.. Notes the wound needed sharp debridement with a #3 curet and bleeding controlled with pressure. He has significant tenderness during  debridement Electronic Signature(s) Signed: 08/15/2017 2:13:18 PM By: Christin Fudge MD, FACS Entered By: Christin Fudge on 08/15/2017 14:13:16 [name] ([id number]) -------------------------------------------------------------------------------- Physician Orders Details Patient Name: CLEATUS, GABRIEL R. Date of Service: 08/15/2017 1:30 PM Medical Record Number: [id number] Patient Account Number: [id number] Date of Birth/Sex: 07/04/1946 (71 y.o. Male) Treating RN: Ahmed Prima Primary Care Provider: [name] Other Clinician: Referring Provider: [name] Treating Provider/Extender: Frann Rider in Treatment: 1 Verbal / Phone Orders: Yes ClinicianCarolyne Fiscal, Debi Read Back and Verified: Yes Diagnosis Coding Wound Cleansing Wound #2 Right,Distal,Anterior Lower Leg o Clean wound with Normal Saline. o Cleanse wound with mild soap and water o May Shower, gently pat wound dry prior to applying new dressing. Anesthetic Wound #2 Right,Distal,Anterior Lower Leg o Topical Lidocaine 4% cream applied to wound bed prior to debridement Primary Wound Dressing Wound #2 Right,Distal,Anterior Lower Leg o Santyl Ointment - in office o Medihoney gel Secondary Dressing Wound #2 Right,Distal,Anterior Lower Leg o Dry Gauze o Other - telfa island, stretch netting #5 Dressing Change Frequency Wound #2 Right,Distal,Anterior Lower Leg o Change dressing every day. Follow-up Appointments Wound #2 Right,Distal,Anterior Lower Leg o Return Appointment in 1 week. Edema Control Wound #2 Right,Distal,Anterior Lower Leg o Elevate legs to the level of the heart and pump ankles as often as possible Additional Orders / Instructions Wound #2 Right,Distal,Anterior Lower Leg o Increase protein intake. Medications-please add to medication list. Wound #2 Right,Distal,Anterior Lower Leg o Other: - Vitamin C, Zinc, MVI SULEMAN, GUNNING ([id number]) Electronic  Signature(s) Signed: 08/15/2017 4:32:33 PM By: Christin Fudge MD, FACS Entered By: Christin Fudge on 08/15/2017 14:13:29 AHMADOU, BOLZ ([id number]) -------------------------------------------------------------------------------- Problem List Details Patient Name: DOROTEO, NICKOLSON R. Date of Service: 08/15/2017 1:30 PM Medical Record Number: [id number] Patient Account Number: [id number] Date of Birth/Sex: [birth year] (71 y.o. Male) Treating RN: Ahmed Prima Primary Care Provider: [name] Other Clinician: Referring Provider: [name] Treating Provider/Extender: Frann Rider in Treatment: 1 Active Problems ICD-10 Encounter Code Description Active Date Diagnosis L97.212 Non-pressure chronic ulcer of right calf with fat layer exposed 08/08/2017 Yes I89.0 Lymphedema, not elsewhere classified 08/08/2017 Yes I73.9 Peripheral vascular disease, unspecified 08/08/2017 Yes Inactive Problems Resolved Problems Electronic Signature(s) Signed: 08/15/2017 2:11:52 PM By: Christin Fudge MD, FACS Entered By: Christin Fudge on 08/15/2017 14:11:52 [name] ([id number]) -------------------------------------------------------------------------------- Progress Note Details Patient Name: [name] Date of Service: 08/15/2017 1:30 PM Medical Record Number: [id number] Patient Account Number: [id number] Date of Birth/Sex: Oct 25, 1945 (71 y.o. Male) Treating RN: Ahmed Prima Primary Care Provider: [name] Other Clinician: Referring Provider: [name] Treating  Provider/Extender: Frann Rider in Treatment: 1 Subjective Chief Complaint Information obtained from Patient Patient seen for complaints of Non-Healing Wound to the right lower extremity medial calf area which he's had for about 3 weeks History of Present Illness (HPI) The following HPI elements were documented for the patient's wound: Location: right upper calf medially Quality: Patient reports  experiencing a sharp pain to affected area(s). Severity: Patient states wound are getting worse. Duration: Patient has had the wound for < 3 weeks prior to presenting for treatment Timing: Pain in wound is Intermittent (comes and goes Context: The wound would happen gradually Modifying Factors: Other treatment(s) tried include:local care with an antibiotic ointment Associated Signs and Symptoms: Patient reports having increase swelling. 71 year old male who has a past medical history of essential hypertension, chronic atrial fibrillation, peripheral vascular disease, nonischemic cardiomyopathy,venous stasis dermatitis, gouty arthropathy, basal cell carcinoma of the right lower extremity, benign prostatic hypertrophy, long-term use of anticoagulation therapy, hyperglycemia and exercise intolerance has never been a smoker. the patient has had a vascular workup over 7 years ago and said everything was normal at that stage. He does not have any chronic problems except for cardiac issues which he sees a cardiologist in [place]. 08/15/2017 -- arterial and venous duplex studies still pending Patient History Information obtained from Patient. Family History Cancer - Father, Diabetes - Paternal Grandparents, Heart Disease - Father, Hypertension - Mother,Father,Siblings, No family history of Hereditary Spherocytosis, Kidney Disease, Lung Disease, Seizures, Stroke, Thyroid Problems, Tuberculosis. Social History Never smoker, Marital Status - Divorced, Alcohol Use - Rarely, Drug Use - No History, Caffeine Use - Never. THAYDEN, LEMIRE R. ([id number]) Objective Constitutional Pulse regular. Respirations normal and unlabored. Afebrile. Vitals Time Taken: 1:41 PM, Height: 73 in, Weight: 243.3 lbs, BMI: 32.1, Temperature: 98.4 F, Pulse: 70 bpm, Respiratory Rate: 20 breaths/min, Blood Pressure: 129/70 mmHg. Eyes Nonicteric. Reactive to light. Ears, Nose, Mouth, and Throat Lips, teeth, and gums WNL.Marland Kitchen  Moist mucosa without lesions. Neck supple and nontender. No palpable supraclavicular or cervical adenopathy. Normal sized without goiter. Respiratory WNL. No retractions.. Cardiovascular Pedal Pulses WNL. No clubbing, cyanosis or edema. Lymphatic No adneopathy. No adenopathy. No adenopathy. Musculoskeletal Adexa without tenderness or enlargement.. Digits and nails w/o clubbing, cyanosis, infection, petechiae, ischemia, or inflammatory conditions.Marland Kitchen Psychiatric Judgement and insight Intact.. No evidence of depression, anxiety, or agitation.. General Notes: the wound needed sharp debridement with a #3 curet and bleeding controlled with pressure. He has significant tenderness during debridement Integumentary (Hair, Skin) No suspicious lesions. No crepitus or fluctuance. No peri-wound warmth or erythema. No masses.. Wound #1 status is Open. Original cause of wound was Gradually Appeared. The wound is located on the Right,Proximal,Anterior Lower Leg. The wound measures 0cm length x 0cm width x 0cm depth; 0cm^2 area and 0cm^3 volume. There is no tunneling or undermining noted. There is a large amount of serous drainage noted. The wound margin is distinct with the outline attached to the wound base. There is no granulation within the wound bed. There is no necrotic tissue within the wound bed. Periwound temperature was noted as No Abnormality. The periwound has tenderness on palpation. Wound #2 status is Open. Original cause of wound was Gradually Appeared. The wound is located on the Right,Distal,Anterior Lower Leg. The wound measures 2cm length x 1.2cm width x 0.2cm depth; 1.885cm^2 area and 0.377cm^3 volume. There is no tunneling or undermining noted. There is a large amount of serous drainage noted. The wound margin is distinct with the outline attached to the wound  base. There is no granulation within the wound bed. There is a large (67-100%) amount of necrotic tissue within the wound bed  including Adherent Slough. The periwound skin appearance exhibited: Maceration. Periwound temperature was noted as No Abnormality. The periwound has tenderness on palpation. IVEY, CINA ([id number]) Assessment Active Problems ICD-10 [phone] - Non-pressure chronic ulcer of right calf with fat layer exposed I89.0 - Lymphedema, not elsewhere classified I73.9 - Peripheral vascular disease, unspecified Procedures Wound #2 Pre-procedure diagnosis of Wound #2 is a Venous Leg Ulcer located on the Right,Distal,Anterior Lower Leg .Severity of Tissue Pre Debridement is: Fat layer exposed. There was a Skin/Subcutaneous Tissue Debridement ([id number]) debridement with total area of 2.4 sq cm performed by Christin Fudge, MD. with the following instrument(s): Curette to remove Viable and Non-Viable tissue/material including Exudate, Fibrin/Slough, and Subcutaneous after achieving pain control using Lidocaine 4% Topical Solution. A time out was conducted at 13:51, prior to the start of the procedure. A Minimum amount of bleeding was controlled with Pressure. The procedure was tolerated well with a pain level of 0 throughout and a pain level of 0 following the procedure. Post Debridement Measurements: 2cm length x 1.2cm width x 0.3cm depth; 0.565cm^3 volume. Character of Wound/Ulcer Post Debridement requires further debridement. Severity of Tissue Post Debridement is: Fat layer exposed. Post procedure Diagnosis Wound #2: Same as Pre-Procedure Plan Wound Cleansing: Wound #2 Right,Distal,Anterior Lower Leg: Clean wound with Normal Saline. Cleanse wound with mild soap and water May Shower, gently pat wound dry prior to applying new dressing. Anesthetic: Wound #2 Right,Distal,Anterior Lower Leg: Topical Lidocaine 4% cream applied to wound bed prior to debridement Primary Wound Dressing: Wound #2 Right,Distal,Anterior Lower Leg: Santyl Ointment - in office Medihoney gel Secondary Dressing: Wound #2  Right,Distal,Anterior Lower Leg: Dry Gauze Other - telfa island, stretch netting #5 Dressing Change Frequency: Wound #2 Right,Distal,Anterior Lower Leg: Change dressing every day. Follow-up Appointments: Wound #2 Right,Distal,Anterior Lower Leg: Return Appointment in 1 week. Edema Control: LOFTON, LEON ([id number]) Wound #2 Right,Distal,Anterior Lower Leg: Elevate legs to the level of the heart and pump ankles as often as possible Additional Orders / Instructions: Wound #2 Right,Distal,Anterior Lower Leg: Increase protein intake. Medications-please add to medication list.: Wound #2 Right,Distal,Anterior Lower Leg: Other: - Vitamin C, Zinc, MVI After sharp debridement and review today, I have recommended: 1. MediHoney ointment locally to be applied daily after washing with soap and water 2. Elevation and exercise 3. Arterial duplex and venous reflux studies both lower extremities -- results pending 4. Regular visits to the wound center Electronic Signature(s) Signed: 08/15/2017 2:14:13 PM By: Christin Fudge MD, FACS Entered By: Christin Fudge on 08/15/2017 14:14:13 ELVEN, LABOY ([id number]) -------------------------------------------------------------------------------- ROS/PFSH Details Patient Name: JARVIS, SAWA R. Date of Service: 08/15/2017 1:30 PM Medical Record Number: [id number] Patient Account Number: [id number] Date of Birth/Sex: 12-02-1945 (71 y.o. Male) Treating RN: Ahmed Prima Primary Care Provider: [name] Other Clinician: Referring Provider: [name] Treating Provider/Extender: Frann Rider in Treatment: 1 Information Obtained From Patient Wound History Do you currently have one or more open woundso Yes How many open wounds do you currently haveo 2 Approximately how long have you had your woundso 3 weeks How have you been treating your wound(s) until nowo vaseline Has your wound(s) ever healed and then re-openedo No Have you had  any lab work done in the past montho No Have you tested positive for an antibiotic resistant organism (MRSA, VRE)o No Have you tested positive for osteomyelitis (bone infection)o No Have  you had any tests for circulation on your legso Yes Where was the test doneo [facility] Have you had other problems associated with your woundso Swelling Cardiovascular Medical History: Positive for: Arrhythmia - a-fib; Hypertension Endocrine Medical History: Positive for: Type II Diabetes Treated with: Diet Musculoskeletal Medical History: Positive for: Gout Immunizations Pneumococcal Vaccine: Received Pneumococcal Vaccination: Yes Implantable Devices Family and Social History Cancer: Yes - Father; Diabetes: Yes - Paternal Grandparents; Heart Disease: Yes - Father; Hereditary Spherocytosis: No; Hypertension: Yes - Mother,Father,Siblings; Kidney Disease: No; Lung Disease: No; Seizures: No; Stroke: No; Thyroid Problems: No; Tuberculosis: No; Never smoker; Marital Status - Divorced; Alcohol Use: Rarely; Drug Use: No History; Caffeine Use: Never; Financial Concerns: No; Food, Clothing or Shelter Needs: No; Support System Lacking: No; Transportation Concerns: No; Advanced Directives: No; Patient does not want information on Advanced Directives; Do not resuscitate: No; Living Will: No; Medical Power of Attorney: No Physician Affirmation I have reviewed and agree with the above information. JENSEN, KILBURG ([id number]) Electronic Signature(s) Signed: 08/15/2017 4:32:33 PM By: Christin Fudge MD, FACS Signed: 08/16/2017 4:36:47 PM By: Alric Quan Entered By: Christin Fudge on 08/15/2017 14:12:36 DERONTE, SOLIS ([id number]) -------------------------------------------------------------------------------- SuperBill Details Patient Name: DEANGLO, HISSONG. Date of Service: 08/15/2017 Medical Record Number: [id number] Patient Account Number: [id number] Date of Birth/Sex: Oct 07, 1946 (71 y.o. Male) Treating RN:  Ahmed Prima Primary Care Provider: [name] Other Clinician: Referring Provider: [name] Treating Provider/Extender: Frann Rider in Treatment: 1 Diagnosis Coding ICD-10 Codes Code Description [phone] Non-pressure chronic ulcer of right calf with fat layer exposed I89.0 Lymphedema, not elsewhere classified I73.9 Peripheral vascular disease, unspecified Facility Procedures CPT4 Code: [id number] Description: 11042 - DEB SUBQ TISSUE 20 SQ CM/< ICD-10 Diagnosis Description L97.212 Non-pressure chronic ulcer of right calf with fat layer exp I89.0 Lymphedema, not elsewhere classified I73.9 Peripheral vascular disease, unspecified Modifier: osed Quantity: 1 Physician Procedures CPT4 Code: 0814481 Description: 11042 - WC PHYS SUBQ TISS 20 SQ CM ICD-10 Diagnosis Description L97.212 Non-pressure chronic ulcer of right calf with fat layer exp I89.0 Lymphedema, not elsewhere classified I73.9 Peripheral vascular disease, unspecified Modifier: osed Quantity: 1 Electronic Signature(s) Signed: 08/15/2017 2:14:26 PM By: Christin Fudge MD, FACS Entered By: Christin Fudge on 08/15/2017 14:14:25

## 2017-08-19 NOTE — Progress Notes (Signed)
to wound bed prior to debridement 3. Peri-wound Care: Skin Prep 4. Dressing Applied: Santyl Ointment 5. Secondary Dressing Applied Dry [place] Notes stretch netting #5 Electronic Signature(s) Signed: 08/16/2017 4:36:47 PM By: Alric Quan Entered By: Alric Quan on 08/15/2017 13:46:33 [place], [name] ([id number]) -------------------------------------------------------------------------------- [place] Details Patient Name: [name], [name] R. Date of Service: 08/15/2017 1:30 PM Medical Record Number: [id number] Patient Account Number: [id number] Date of Birth/Sex: July 28, 1946 (71 y.o. Male) Treating RN: Ahmed Prima Primary Care Cherril Hett: [name] Other Clinician: Referring Anaira Seay: [name] Treating Wesly Whisenant/Extender: Frann Rider in Treatment: 1 Vital Signs Time Taken: 13:41 Temperature (F): 98.4 Height (in): 73 Pulse (bpm): 70 Weight (lbs): 243.3 Respiratory Rate (breaths/min): 20 Body  Mass Index (BMI): 32.1 Blood Pressure (mmHg): 129/70 Reference Range: 80 - 120 mg / dl Electronic Signature(s) Signed: 08/16/2017 4:36:47 PM By: Alric Quan Entered By: Alric Quan on 08/15/2017 13:42:49  Debra on 08/15/2017 13:41:03 [name], [name] ([id number]) -------------------------------------------------------------------------------- Patient/Caregiver Education Details Patient Name: [name], [name] Date of Service: 08/15/2017 1:30 PM Medical Record Number: [id number] Patient Account Number: [id number] Date of Birth/Gender: [birth year] (71 y.o. Male) Treating RN: Ahmed Prima Primary Care Physician: [name] Other Clinician: Referring Physician: [name] Treating Physician/Extender: Frann Rider in Treatment: 1 Education Assessment Education Provided To: Patient Education Topics Provided Wound/Skin Impairment: Handouts: Other: change dressing as ordered Methods: Demonstration, Explain/Verbal Responses: State content correctly Electronic Signature(s) Signed: 08/16/2017 4:36:47 PM By: Alric Quan Entered By: Alric Quan on 08/15/2017 13:51:22 [name], [name] ([id number]) -------------------------------------------------------------------------------- Wound Assessment Details Patient Name: [name], [name] R. Date of Service: 08/15/2017 1:30 PM Medical Record Number: [id number] Patient Account Number: [id number] Date of Birth/Sex: 12-01-1945 (71 y.o. Male) Treating RN: Ahmed Prima Primary Care Neli Fofana: [name] Other Clinician: Referring Pershing Skidmore: [name] Treating Edmund Holcomb/Extender: Frann Rider in Treatment: 1 Wound Status Wound Number: 1 Primary Venous Leg Ulcer Etiology: Wound Location: Right Lower Leg - Anterior, Proximal Wound Status: Open Wounding Event: Gradually Appeared Comorbid Arrhythmia, Hypertension, Type II Diabetes, Date Acquired: 07/18/2017 History: Gout Weeks Of Treatment: 1 Clustered Wound: No Photos Photo Uploaded By: Alric Quan on 08/15/2017 16:19:25 Wound Measurements Length: (cm) 0 % Re Width: (cm)  0 % Re Depth: (cm) 0 Epit Area: (cm) 0 Tun Volume: (cm) 0 Und duction in Area: 100% duction in Volume: 100% helialization: None neling: No ermining: No Wound Description Classification: Partial Thickness Wound Margin: Distinct, outline attached Exudate Amount: Large Exudate Type: Serous Exudate Color: amber Foul Odor After Cleansing: No Slough/Fibrino No Wound Bed Granulation Amount: None Present (0%) Necrotic Amount: None Present (0%) Periwound Skin Texture Texture Color No Abnormalities Noted: No No Abnormalities Noted: No Moisture Temperature / Pain No Abnormalities Noted: No Temperature: No Abnormality Tenderness on Palpation: Yes [name], [name] R. ([id number]) Wound Preparation Ulcer Cleansing: Rinsed/Irrigated with Saline Topical Anesthetic Applied: None Electronic Signature(s) Signed: 08/16/2017 4:36:47 PM By: Alric Quan Entered By: Alric Quan on 08/15/2017 13:55:43 [name], [name] ([id number]) -------------------------------------------------------------------------------- Wound Assessment Details Patient Name: [name], [name] R. Date of Service: 08/15/2017 1:30 PM Medical Record Number: [id number] Patient Account Number: [id number] Date of Birth/Sex: [birth year] (72 y.o. Male) Treating RN: Ahmed Prima Primary Care Costas Sena: [name] Other Clinician: Referring Idris Edmundson: [name] Treating Sirius Woodford/Extender: Frann Rider in Treatment: 1 Wound Status Wound Number: 2 Primary Venous Leg Ulcer Etiology: Wound Location: Right Lower Leg - Anterior, Distal Wound Status: Open Wounding Event: Gradually Appeared Comorbid Arrhythmia, Hypertension, Type II Diabetes, Date Acquired: 07/18/2017 History: Gout Weeks Of Treatment: 1 Clustered Wound: No Photos Photo Uploaded By: Alric Quan on 08/15/2017 16:19:25 Wound Measurements Length: (cm) 2 Width: (cm) 1.2 Depth: (cm) 0.2 Area: (cm) 1.885 Volume: (cm) 0.377 %  Reduction in Area: 33.9% % Reduction in Volume: -32.3% Epithelialization: None Tunneling: No Undermining: No Wound Description Classification: Partial Thickness Wound Margin: Distinct, outline attached Exudate Amount: Large Exudate Type: Serous Exudate Color: amber Foul Odor After Cleansing: No Slough/Fibrino Yes Wound Bed Granulation Amount: None Present (0%) Necrotic Amount: Large (67-100%) Necrotic Quality: Adherent Slough Periwound Skin Texture Texture Color No Abnormalities Noted: No No Abnormalities Noted: No Moisture Temperature / Pain No Abnormalities Noted: No Temperature: No Abnormality [name], [name] R. ([id number]) Maceration: Yes Tenderness on Palpation: Yes Wound Preparation Ulcer Cleansing: Rinsed/Irrigated with Saline Topical Anesthetic Applied: Other: lidocaine 4%, Treatment Notes Wound #2 (Right, Distal, Anterior Lower Leg) 1. Cleansed with: Clean wound with Normal Saline 2. Anesthetic Topical Lidocaine 4% cream  Debra on 08/15/2017 13:41:03 [name], [name] ([id number]) -------------------------------------------------------------------------------- Patient/Caregiver Education Details Patient Name: [name], [name] Date of Service: 08/15/2017 1:30 PM Medical Record Number: [id number] Patient Account Number: [id number] Date of Birth/Gender: [birth year] (71 y.o. Male) Treating RN: Ahmed Prima Primary Care Physician: [name] Other Clinician: Referring Physician: [name] Treating Physician/Extender: Frann Rider in Treatment: 1 Education Assessment Education Provided To: Patient Education Topics Provided Wound/Skin Impairment: Handouts: Other: change dressing as ordered Methods: Demonstration, Explain/Verbal Responses: State content correctly Electronic Signature(s) Signed: 08/16/2017 4:36:47 PM By: Alric Quan Entered By: Alric Quan on 08/15/2017 13:51:22 [name], [name] ([id number]) -------------------------------------------------------------------------------- Wound Assessment Details Patient Name: [name], [name] R. Date of Service: 08/15/2017 1:30 PM Medical Record Number: [id number] Patient Account Number: [id number] Date of Birth/Sex: 12-01-1945 (71 y.o. Male) Treating RN: Ahmed Prima Primary Care Neli Fofana: [name] Other Clinician: Referring Pershing Skidmore: [name] Treating Edmund Holcomb/Extender: Frann Rider in Treatment: 1 Wound Status Wound Number: 1 Primary Venous Leg Ulcer Etiology: Wound Location: Right Lower Leg - Anterior, Proximal Wound Status: Open Wounding Event: Gradually Appeared Comorbid Arrhythmia, Hypertension, Type II Diabetes, Date Acquired: 07/18/2017 History: Gout Weeks Of Treatment: 1 Clustered Wound: No Photos Photo Uploaded By: Alric Quan on 08/15/2017 16:19:25 Wound Measurements Length: (cm) 0 % Re Width: (cm)  0 % Re Depth: (cm) 0 Epit Area: (cm) 0 Tun Volume: (cm) 0 Und duction in Area: 100% duction in Volume: 100% helialization: None neling: No ermining: No Wound Description Classification: Partial Thickness Wound Margin: Distinct, outline attached Exudate Amount: Large Exudate Type: Serous Exudate Color: amber Foul Odor After Cleansing: No Slough/Fibrino No Wound Bed Granulation Amount: None Present (0%) Necrotic Amount: None Present (0%) Periwound Skin Texture Texture Color No Abnormalities Noted: No No Abnormalities Noted: No Moisture Temperature / Pain No Abnormalities Noted: No Temperature: No Abnormality Tenderness on Palpation: Yes [name], [name] R. ([id number]) Wound Preparation Ulcer Cleansing: Rinsed/Irrigated with Saline Topical Anesthetic Applied: None Electronic Signature(s) Signed: 08/16/2017 4:36:47 PM By: Alric Quan Entered By: Alric Quan on 08/15/2017 13:55:43 [name], [name] ([id number]) -------------------------------------------------------------------------------- Wound Assessment Details Patient Name: [name], [name] R. Date of Service: 08/15/2017 1:30 PM Medical Record Number: [id number] Patient Account Number: [id number] Date of Birth/Sex: [birth year] (72 y.o. Male) Treating RN: Ahmed Prima Primary Care Costas Sena: [name] Other Clinician: Referring Idris Edmundson: [name] Treating Sirius Woodford/Extender: Frann Rider in Treatment: 1 Wound Status Wound Number: 2 Primary Venous Leg Ulcer Etiology: Wound Location: Right Lower Leg - Anterior, Distal Wound Status: Open Wounding Event: Gradually Appeared Comorbid Arrhythmia, Hypertension, Type II Diabetes, Date Acquired: 07/18/2017 History: Gout Weeks Of Treatment: 1 Clustered Wound: No Photos Photo Uploaded By: Alric Quan on 08/15/2017 16:19:25 Wound Measurements Length: (cm) 2 Width: (cm) 1.2 Depth: (cm) 0.2 Area: (cm) 1.885 Volume: (cm) 0.377 %  Reduction in Area: 33.9% % Reduction in Volume: -32.3% Epithelialization: None Tunneling: No Undermining: No Wound Description Classification: Partial Thickness Wound Margin: Distinct, outline attached Exudate Amount: Large Exudate Type: Serous Exudate Color: amber Foul Odor After Cleansing: No Slough/Fibrino Yes Wound Bed Granulation Amount: None Present (0%) Necrotic Amount: Large (67-100%) Necrotic Quality: Adherent Slough Periwound Skin Texture Texture Color No Abnormalities Noted: No No Abnormalities Noted: No Moisture Temperature / Pain No Abnormalities Noted: No Temperature: No Abnormality [name], [name] R. ([id number]) Maceration: Yes Tenderness on Palpation: Yes Wound Preparation Ulcer Cleansing: Rinsed/Irrigated with Saline Topical Anesthetic Applied: Other: lidocaine 4%, Treatment Notes Wound #2 (Right, Distal, Anterior Lower Leg) 1. Cleansed with: Clean wound with Normal Saline 2. Anesthetic Topical Lidocaine 4% cream  cm) : [1:0] [2:1.885] [[name]:[name]] Volume (cm) : [1:0] [2:0.377] [[name]:[name]] % Reduction in Area: [1:100.00%] [2:33.90%] [[name]:[name]] % Reduction in Volume: [1:100.00%] [2:-32.30%] [[name]:[name]] Classification: [1:Partial Thickness] [2:Partial Thickness] [[name]:[name]] Exudate Amount: [1:Large] [2:Large] [[name]:[name]] Exudate Type: [1:Serous] [2:Serous] [[name]:[name]] Exudate Color: [1:amber] [2:amber] [[name]:[name]] Wound Margin: [1:Distinct, outline attached] [2:Distinct, outline attached] [[name]:[name]] Granulation Amount: [1:None Present (0%)] [2:None Present (0%)] [[name]:[name]] Necrotic Amount: [1:None Present (0%)] [2:Large (67-100%)] [[name]:[name]] Epithelialization: [1:None] [2:None] [[name]:[name]] Debridement: [1:[name]] [2:Debridement ([id number])] [[name]:[name]] Pre-procedure [1:[name]] [2:13:51] [[name]:[name]] Verification/Time Out Taken: Pain Control: [1:[name]] [2:Lidocaine 4% Topical Solution] [[name]:[name]] Tissue Debrided: [1:[name]] [2:Fibrin/Slough, Exudates, Subcutaneous]  [[name]:[name]] Level: [1:[name]] [2:Skin/Subcutaneous Tissue] [[name]:[name]] Debridement Area (sq cm): [1:[name]] [2:2.4] [[name]:[name]] Instrument: [1:[name]] [2:Curette] [[name]:[name]] Bleeding: [1:[name]] [2:Minimum] [[name]:[name]] Hemostasis Achieved: [1:[name]] [2:Pressure] [[name]:[name]] Procedural Pain: [1:[name]] [2:0] [[name]:[name]] Post Procedural Pain: [1:[name]] [2:0] [[name]:[name]] Debridement Treatment [name] Procedure was tolerated well [name] Response: Post Debridement [name] 2x1.2x0.3 [name] Measurements L x W x D (cm) Post Debridement Volume: [name] 0.565 [name] (cm) Periwound Skin Texture: No Abnormalities Noted No Abnormalities Noted [name] Periwound Skin Moisture: No Abnormalities Noted Maceration: Yes [name] Periwound Skin Color: No Abnormalities Noted No Abnormalities Noted [name] Temperature: No Abnormality No Abnormality [name] Tenderness on Palpation: Yes Yes [name] Wound Preparation: Ulcer Cleansing: Ulcer Cleansing: [name] Rinsed/Irrigated with Saline Rinsed/Irrigated with Saline Topical Anesthetic Applied: Topical Anesthetic Applied: None Other: lidocaine 4% Procedures Performed: [name] Debridement [name] Treatment Notes Wound #2 (Right, Distal, Anterior Lower Leg) 1. Cleansed with: Clean wound with Normal Saline 2. Anesthetic Topical Lidocaine 4% cream to wound bed prior to debridement 3. Peri-wound Care: Skin Prep 4. Dressing Applied: Santyl Ointment 5. Secondary Dressing Applied Dry [place] Notes stretch netting #5 Electronic Signature(s) Signed: 08/15/2017 2:11:58 PM By: Christin Fudge MD, FACS Previous Signature: 08/15/2017 1:49:54 PM Version By: Alric Quan Entered By: Christin Fudge on 08/15/2017 14:11:58 [name] ([id number]) -------------------------------------------------------------------------------- [place] Details Patient Name: [name], KRAKOW. Date of Service: 08/15/2017 1:30 PM Medical Record Number: [id number] Patient Account Number: [id number] Date of Birth/Sex: May 31, 1946 (71  y.o. Male) Treating RN: Ahmed Prima Primary Care Wataru Mccowen: [name] Other Clinician: Referring [name] Mindel: [name] Treating Maliki Gignac/Extender: Frann Rider in Treatment: 1 Active Inactive ` Orientation to the Wound Care Program Nursing Diagnoses: Knowledge deficit related to the wound healing center program Goals: Patient/caregiver will verbalize understanding of the [facility] Program Date Initiated: 08/08/2017 Target Resolution Date: 09/14/2017 Goal Status: Active Interventions: Provide education on orientation to the wound center Notes: ` Wound/Skin Impairment Nursing Diagnoses: Impaired tissue integrity Knowledge deficit related to ulceration/compromised skin integrity Goals: Ulcer/skin breakdown will have a volume reduction of 80% by week 12 Date Initiated: 08/08/2017 Target Resolution Date: 12/14/2017 Goal Status: Active Interventions: Assess patient/caregiver ability to perform ulcer/skin care regimen upon admission and as needed Assess ulceration(s) every visit Notes: Electronic Signature(s) Signed: 08/16/2017 4:36:47 PM By: Alric Quan Previous Signature: 08/15/2017 1:49:48 PM Version By: Alric Quan Entered By: Alric Quan on 08/15/2017 13:51:45 [name] ([id number]) -------------------------------------------------------------------------------- Pain Assessment Details Patient Name: [name], SCHUPP R. Date of Service: 08/15/2017 1:30 PM Medical Record Number: [id number] Patient Account Number: [id number] Date of Birth/Sex: 12-12-1945 (72 y.o. Male) Treating RN: Ahmed Prima Primary Care Baran Kuhrt: [name] Other Clinician: Referring Scotti Motter: [name] Treating Teanna Elem/Extender: Frann Rider in Treatment: 1 Active Problems Location of Pain Severity and Description of Pain Patient Has Paino No Site Locations Pain Management and Medication Current Pain Management: Electronic  Signature(s) Signed: 08/16/2017 4:36:47 PM By: Alric Quan Entered By: Carolyne Fiscal,

## 2017-08-23 ENCOUNTER — Encounter: Payer: Medicare Other | Admitting: Surgery

## 2017-08-23 DIAGNOSIS — L97212 Non-pressure chronic ulcer of right calf with fat layer exposed: Secondary | ICD-10-CM | POA: Diagnosis not present

## 2017-08-24 NOTE — Progress Notes (Addendum)
DEANDREW, HOECKER ([id number]) Visit Report for 08/23/2017 Chief Complaint Document Details Patient Name: [name], [name] Date of Service: 08/23/2017 8:45 AM Medical Record Number: [id number] Patient Account Number: [id number] Date of Birth/Sex: [birth year] (71 y.o. Male) Treating RN: Ahmed Prima Primary Care Provider: [name] Other Clinician: Referring Provider: [name] Treating Provider/Extender: Frann Rider in Treatment: 2 Information Obtained from: Patient Chief Complaint Patient seen for complaints of Non-Healing Wound to the right lower extremity medial calf area which he's had for about 3 weeks Electronic Signature(s) Signed: 08/23/2017 9:19:28 AM By: Christin Fudge MD, FACS Entered By: Christin Fudge on 08/23/2017 09:19:28 [name] ([id number]) -------------------------------------------------------------------------------- Debridement Details Patient Name: [name], [name] [name] Date of Service: 08/23/2017 8:45 AM Medical Record Number: [id number] Patient Account Number: [id number] Date of Birth/Sex: 04-Jul-1946 (71 y.o. Male) Treating RN: Ahmed Prima Primary Care Provider: [name] Other Clinician: Referring Provider: [name] Treating Provider/Extender: Frann Rider in Treatment: 2 Debridement Performed for Wound #2 Right,Distal,Anterior Lower Leg Assessment: Performed By: Physician Christin Fudge, MD Debridement: Debridement Severity of Tissue Pre Fat layer exposed Debridement: Pre-procedure Verification/Time Yes - 09:04 Out Taken: Start Time: 09:05 Pain Control: Lidocaine 4% Topical Solution Level: Skin/Subcutaneous Tissue Total Area Debrided (L x W): 2 (cm) x 1.2 (cm) = 2.4 (cm) Tissue and other material Viable, Non-Viable, Exudate, Fibrin/Slough, Subcutaneous debrided: Instrument: Curette Bleeding: Minimum Hemostasis Achieved: Pressure End Time: 09:07 Procedural Pain: 0 Post Procedural Pain: 0 Response  to Treatment: Procedure was tolerated well Post Debridement Measurements of Total Wound Length: (cm) 2 Width: (cm) 1.2 Depth: (cm) 0.3 Volume: (cm) 0.565 Character of Wound/Ulcer Post Debridement: Requires Further Debridement Severity of Tissue Post Debridement: Fat layer exposed Post Procedure Diagnosis Same as Pre-procedure Electronic Signature(s) Signed: 08/23/2017 9:19:23 AM By: Christin Fudge MD, FACS Signed: 08/23/2017 4:11:32 PM By: Alric Quan Entered By: Christin Fudge on 08/23/2017 09:19:23 [name] ([id number]) -------------------------------------------------------------------------------- HPI Details Patient Name: [name], [name] [name] Date of Service: 08/23/2017 8:45 AM Medical Record Number: [id number] Patient Account Number: [id number] Date of Birth/Sex: 1946/07/18 (71 y.o. Male) Treating RN: Ahmed Prima Primary Care Provider: [name] Other Clinician: Referring Provider: [name] Treating Provider/Extender: Frann Rider in Treatment: 2 History of Present Illness Location: right upper calf medially Quality: Patient reports experiencing a sharp pain to affected area(s). Severity: Patient states wound are getting worse. Duration: Patient has had the wound for < 3 weeks prior to presenting for treatment Timing: Pain in wound is Intermittent (comes and goes Context: The wound would happen gradually Modifying Factors: Other treatment(s) tried include:local care with an antibiotic ointment Associated Signs and Symptoms: Patient reports having increase swelling. HPI Description: 71 year old male who has a past medical history of essential hypertension, chronic atrial fibrillation, peripheral vascular disease, nonischemic cardiomyopathy,venous stasis dermatitis, gouty arthropathy, basal cell carcinoma of the right lower extremity, benign prostatic hypertrophy, long-term use of anticoagulation therapy, hyperglycemia and exercise intolerance  has never been a smoker. the patient has had a vascular workup over 7 years ago and said everything was normal at that stage. He does not have any chronic problems except for cardiac issues which he sees a cardiologist in [place]. 08/15/2017 -- arterial and venous duplex studies still pending. 08/23/2017 -- venous reflux studies done on 08/13/2017 shows venous incompetence throughout the left lower extremity deep system and focally at the left saphenofemoral junction. 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 No evidence of SVT or DVT in bilateral lower extremities The patient has an appointment  at the end of the month to get his arterial duplex study done Electronic Signature(s) Signed: 08/23/2017 9:19:52 AM By: Christin Fudge MD, FACS Previous Signature: 08/23/2017 8:56:34 AM Version By: Christin Fudge MD, FACS Entered By: Christin Fudge on 08/23/2017 09:19:52 [name] ([id number]) -------------------------------------------------------------------------------- Physical Exam Details Patient Name: [name], [name] [name] Date of Service: 08/23/2017 8:45 AM Medical Record Number: [id number] Patient Account Number: [id number] Date of Birth/Sex: 04-Jun-1946 (71 y.o. Male) Treating RN: Ahmed Prima Primary Care Provider: [name] Other Clinician: Referring Provider: [name] Treating Provider/Extender: Frann Rider in Treatment: 2 Constitutional . Pulse regular. Respirations normal and unlabored. Afebrile. . Eyes Nonicteric. Reactive to light. Ears, Nose, Mouth, and Throat Lips, teeth, and gums WNL.Marland Kitchen Moist mucosa without lesions. Neck supple and nontender. No palpable supraclavicular or cervical adenopathy. Normal sized without goiter. Respiratory WNL. No retractions.. Cardiovascular Pedal Pulses WNL. No clubbing, cyanosis or edema. Lymphatic No adneopathy. No adenopathy. No adenopathy. Musculoskeletal Adexa without tenderness or  enlargement.. Digits and nails w/o clubbing, cyanosis, infection, petechiae, ischemia, or inflammatory conditions.. Integumentary (Hair, Skin) No suspicious lesions. No crepitus or fluctuance. No peri-wound warmth or erythema. No masses.Marland Kitchen Psychiatric Judgement and insight Intact.. No evidence of depression, anxiety, or agitation.. Notes the wound looks fairly covered with subcutaneous debris and using a #3 curet Sharply remove this and minimal bleeding controlled with pressure. Electronic Signature(s) Signed: 08/23/2017 9:20:28 AM By: Christin Fudge MD, FACS Entered By: Christin Fudge on 08/23/2017 09:20:28 [name] ([id number]) -------------------------------------------------------------------------------- Physician Orders Details Patient Name: [name], [name] [name] Date of Service: 08/23/2017 8:45 AM Medical Record Number: [id number] Patient Account Number: [id number] Date of Birth/Sex: March 22, 1946 (71 y.o. Male) Treating RN: Ahmed Prima Primary Care Provider: [name] Other Clinician: Referring Provider: [name] Treating Provider/Extender: Frann Rider in Treatment: 2 Verbal / Phone Orders: Yes ClinicianCarolyne Fiscal, Debi Read Back and Verified: Yes Diagnosis Coding Wound Cleansing Wound #2 Right,Distal,Anterior Lower Leg o Clean wound with Normal Saline. o Cleanse wound with mild soap and water o May Shower, gently pat wound dry prior to applying new dressing. Anesthetic Wound #2 Right,Distal,Anterior Lower Leg o Topical Lidocaine 4% cream applied to wound bed prior to debridement Primary Wound Dressing Wound #2 Right,Distal,Anterior Lower Leg o Santyl Ointment - in office o Medihoney gel Secondary Dressing Wound #2 Right,Distal,Anterior Lower Leg o Dry Gauze o Other - telfa island, stretch netting #5 Dressing Change Frequency Wound #2 Right,Distal,Anterior Lower Leg o Change dressing every day. Follow-up Appointments Wound  #2 Right,Distal,Anterior Lower Leg o Return Appointment in 1 week. Edema Control Wound #2 Right,Distal,Anterior Lower Leg o Elevate legs to the level of the heart and pump ankles as often as possible Additional Orders / Instructions Wound #2 Right,Distal,Anterior Lower Leg o Increase protein intake. Medications-please add to medication list. Wound #2 Right,Distal,Anterior Lower Leg o Other: - Vitamin C, Zinc, MVI [name], [name] ([id number]) Electronic Signature(s) Signed: 08/23/2017 10:18:33 AM By: Christin Fudge MD, FACS Entered By: Christin Fudge on 08/23/2017 09:20:38 [name], [name] ([id number]) -------------------------------------------------------------------------------- Problem List Details Patient Name: [name], [name] [name] Date of Service: 08/23/2017 8:45 AM Medical Record Number: [id number] Patient Account Number: [id number] Date of Birth/Sex: 1945/10/10 (71 y.o. Male) Treating RN: Ahmed Prima Primary Care Provider: [name] Other Clinician: Referring Provider: [name] Treating Provider/Extender: Frann Rider in Treatment: 2 Active Problems ICD-10 Encounter Code Description Active Date Diagnosis L97.212 Non-pressure chronic ulcer of right calf with fat layer exposed 08/08/2017 Yes I89.0 Lymphedema, not elsewhere classified 08/08/2017 Yes I73.9 Peripheral vascular  disease, unspecified 08/08/2017 Yes Inactive Problems Resolved Problems Electronic Signature(s) Signed: 08/23/2017 9:19:09 AM By: Christin Fudge MD, FACS Entered By: Christin Fudge on 08/23/2017 09:19:09 [name] ([id number]) -------------------------------------------------------------------------------- Progress Note Details Patient Name: [name], [name] [name] Date of Service: 08/23/2017 8:45 AM Medical Record Number: [id number] Patient Account Number: [id number] Date of Birth/Sex: 10-20-1945 (71 y.o. Male) Treating RN: Ahmed Prima Primary Care Provider: [name] Other Clinician: Referring Provider: [name] Treating Provider/Extender: Frann Rider in Treatment: 2 Subjective Chief Complaint Information obtained from Patient Patient seen for complaints of Non-Healing Wound to the right lower extremity medial calf area which he's had for about 3 weeks History of Present Illness (HPI) The following HPI elements were documented for the patient's wound: Location: right upper calf medially Quality: Patient reports experiencing a sharp pain to affected area(s). Severity: Patient states wound are getting worse. Duration: Patient has had the wound for < 3 weeks prior to presenting for treatment Timing: Pain in wound is Intermittent (comes and goes Context: The wound would happen gradually Modifying Factors: Other treatment(s) tried include:local care with an antibiotic ointment Associated Signs and Symptoms: Patient reports having increase swelling. 71 year old male who has a past medical history of essential hypertension, chronic atrial fibrillation, peripheral vascular disease, nonischemic cardiomyopathy,venous stasis dermatitis, gouty arthropathy, basal cell carcinoma of the right lower extremity, benign prostatic hypertrophy, long-term use of anticoagulation therapy, hyperglycemia and exercise intolerance has never been a smoker. the patient has had a vascular workup over 7 years ago and said everything was normal at that stage. He does not have any chronic problems except for cardiac issues which he sees a cardiologist in [place]. 08/15/2017 -- arterial and venous duplex studies still pending. 08/23/2017 -- venous reflux studies done on 08/13/2017 shows venous incompetence throughout the left lower extremity deep system and focally at the left saphenofemoral junction. 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 No evidence of SVT or DVT in bilateral lower extremities The patient has an appointment at the end  of the month to get his arterial duplex study done Patient History Information obtained from Patient. Family History Cancer - Father, Diabetes - Paternal Grandparents, Heart Disease - Father, Hypertension - Mother,Father,Siblings, No family history of Hereditary Spherocytosis, Kidney Disease, Lung Disease, Seizures, Stroke, Thyroid Problems, Tuberculosis. Social History Never smoker, Marital Status - Divorced, Alcohol Use - Rarely, Drug Use - No History, Caffeine Use - Never. JAPHETH, DIEKMAN [name] ([id number]) Objective Constitutional Pulse regular. Respirations normal and unlabored. Afebrile. Vitals Time Taken: 8:55 AM, Height: 73 in, Weight: 243.3 lbs, BMI: 32.1, Temperature: 97.9 F, Pulse: 75 bpm, Respiratory Rate: 20 breaths/min, Blood Pressure: 142/79 mmHg. Eyes Nonicteric. Reactive to light. Ears, Nose, Mouth, and Throat Lips, teeth, and gums WNL.Marland Kitchen Moist mucosa without lesions. Neck supple and nontender. No palpable supraclavicular or cervical adenopathy. Normal sized without goiter. Respiratory WNL. No retractions.. Cardiovascular Pedal Pulses WNL. No clubbing, cyanosis or edema. Lymphatic No adneopathy. No adenopathy. No adenopathy. Musculoskeletal Adexa without tenderness or enlargement.. Digits and nails w/o clubbing, cyanosis, infection, petechiae, ischemia, or inflammatory conditions.Marland Kitchen Psychiatric Judgement and insight Intact.. No evidence of depression, anxiety, or agitation.. General Notes: the wound looks fairly covered with subcutaneous debris and using a #3 curet Sharply remove this and minimal bleeding controlled with pressure. Integumentary (Hair, Skin) No suspicious lesions. No crepitus or fluctuance. No peri-wound warmth or erythema. No masses.. Wound #2 status is Open. Original cause of wound was Gradually Appeared. The wound is located on the [place]  Leg. The wound measures 2cm length x 1.2cm width x 0.2cm depth; 1.885cm^2 area  and 0.377cm^3 volume. There is Fat Layer (Subcutaneous Tissue) Exposed exposed. There is no tunneling or undermining noted. There is a large amount of serous drainage noted. The wound margin is distinct with the outline attached to the wound base. There is medium (34-66%) red granulation within the wound bed. There is a medium (34-66%) amount of necrotic tissue within the wound bed including Adherent Slough. The periwound skin appearance exhibited: Maceration. Periwound temperature was noted as No Abnormality. The periwound has tenderness on palpation. [name], [name] ([id number]) Assessment Active Problems ICD-10 [phone] - Non-pressure chronic ulcer of right calf with fat layer exposed I89.0 - Lymphedema, not elsewhere classified I73.9 - Peripheral vascular disease, unspecified Procedures Wound #2 Pre-procedure diagnosis of Wound #2 is a Venous Leg Ulcer located on the Right,Distal,Anterior Lower Leg .Severity of Tissue Pre Debridement is: Fat layer exposed. There was a Skin/Subcutaneous Tissue Debridement (10175-10258) debridement with total area of 2.4 sq cm performed by Christin Fudge, MD. with the following instrument(s): Curette to remove Viable and Non-Viable tissue/material including Exudate, Fibrin/Slough, and Subcutaneous after achieving pain control using Lidocaine 4% Topical Solution. A time out was conducted at 09:04, prior to the start of the procedure. A Minimum amount of bleeding was controlled with Pressure. The procedure was tolerated well with a pain level of 0 throughout and a pain level of 0 following the procedure. Post Debridement Measurements: 2cm length x 1.2cm width x 0.3cm depth; 0.565cm^3 volume. Character of Wound/Ulcer Post Debridement requires further debridement. Severity of Tissue Post Debridement is: Fat layer exposed. Post procedure Diagnosis Wound #2: Same as Pre-Procedure Plan Wound Cleansing: Wound #2 Right,Distal,Anterior Lower Leg: Clean wound with  Normal Saline. Cleanse wound with mild soap and water May Shower, gently pat wound dry prior to applying new dressing. Anesthetic: Wound #2 Right,Distal,Anterior Lower Leg: Topical Lidocaine 4% cream applied to wound bed prior to debridement Primary Wound Dressing: Wound #2 Right,Distal,Anterior Lower Leg: Santyl Ointment - in office Medihoney gel Secondary Dressing: Wound #2 Right,Distal,Anterior Lower Leg: Dry Gauze Other - telfa island, stretch netting #5 Dressing Change Frequency: Wound #2 Right,Distal,Anterior Lower Leg: Change dressing every day. Follow-up Appointments: Wound #2 Right,Distal,Anterior Lower Leg: Return Appointment in 1 week. Edema Control: [name], [name] ([id number]) Wound #2 Right,Distal,Anterior Lower Leg: Elevate legs to the level of the heart and pump ankles as often as possible Additional Orders / Instructions: Wound #2 Right,Distal,Anterior Lower Leg: Increase protein intake. Medications-please add to medication list.: Wound #2 Right,Distal,Anterior Lower Leg: Other: - Vitamin C, Zinc, MVI we have discussed his venous reflux study and I do not believe this would require any endovenous ablation. We are still awaiting his arterial duplex study which is going to be done at the end of this month. After sharp debridement and review today, I have recommended: 1. MediHoney ointment locally to be applied daily after washing with soap and water 2. Elevation and exercise 3. Arterial duplex studies both lower extremities -- Appointment pending 4. Regular visits to the wound center Electronic Signature(s) Signed: 08/23/2017 9:21:41 AM By: Christin Fudge MD, FACS Entered By: Christin Fudge on 08/23/2017 09:21:41 RYETT, HAMMAN ([id number]) -------------------------------------------------------------------------------- ROS/PFSH Details Patient Name: KYSHAUN, BARNETTE [name] Date of Service: 08/23/2017 8:45 AM Medical Record Number: [id number] Patient Account  Number: [id number] Date of Birth/Sex: [birth year] (71 y.o. Male) Treating RN: Ahmed Prima Primary Care Provider: [name] Other Clinician: Referring Provider: [name] Treating Provider/Extender: Frann Rider in  Treatment: 2 Information Obtained From Patient Wound History Do you currently have one or more open woundso Yes How many open wounds do you currently haveo 2 Approximately how long have you had your woundso 3 weeks How have you been treating your wound(s) until nowo vaseline Has your wound(s) ever healed and then re-openedo No Have you had any lab work done in the past montho No Have you tested positive for an antibiotic resistant organism (MRSA, VRE)o No Have you tested positive for osteomyelitis (bone infection)o No Have you had any tests for circulation on your legso Yes Where was the test doneo [facility] Have you had other problems associated with your woundso Swelling Cardiovascular Medical History: Positive for: Arrhythmia - a-fib; Hypertension Endocrine Medical History: Positive for: Type II Diabetes Treated with: Diet Musculoskeletal Medical History: Positive for: Gout Immunizations Pneumococcal Vaccine: Received Pneumococcal Vaccination: Yes Implantable Devices Family and Social History Cancer: Yes - Father; Diabetes: Yes - Paternal Grandparents; Heart Disease: Yes - Father; Hereditary Spherocytosis: No; Hypertension: Yes - Mother,Father,Siblings; Kidney Disease: No; Lung Disease: No; Seizures: No; Stroke: No; Thyroid Problems: No; Tuberculosis: No; Never smoker; Marital Status - Divorced; Alcohol Use: Rarely; Drug Use: No History; Caffeine Use: Never; Financial Concerns: No; Food, Clothing or Shelter Needs: No; Support System Lacking: No; Transportation Concerns: No; Advanced Directives: No; Patient does not want information on Advanced Directives; Do not resuscitate: No; Living Will: No; Medical Power of Attorney: No Physician Affirmation I  have reviewed and agree with the above information. KEYSHON, STEIN ([id number]) Electronic Signature(s) Signed: 08/23/2017 10:18:33 AM By: Christin Fudge MD, FACS Signed: 08/23/2017 4:11:32 PM By: Alric Quan Entered By: Christin Fudge on 08/23/2017 09:19:59 DIMARCO, MINKIN ([id number]) -------------------------------------------------------------------------------- SuperBill Details Patient Name: RUBE, SANCHEZ. Date of Service: 08/23/2017 Medical Record Number: [id number] Patient Account Number: [id number] Date of Birth/Sex: Feb 12, 1946 (71 y.o. Male) Treating RN: Ahmed Prima Primary Care Provider: [name] Other Clinician: Referring Provider: [name] Treating Provider/Extender: Frann Rider in Treatment: 2 Diagnosis Coding ICD-10 Codes Code Description [phone] Non-pressure chronic ulcer of right calf with fat layer exposed I89.0 Lymphedema, not elsewhere classified I73.9 Peripheral vascular disease, unspecified Facility Procedures CPT4 Code: [id number] Description: 11042 - DEB SUBQ TISSUE 20 SQ CM/< ICD-10 Diagnosis Description L97.212 Non-pressure chronic ulcer of right calf with fat layer exp I89.0 Lymphedema, not elsewhere classified I73.9 Peripheral vascular disease, unspecified Modifier: osed Quantity: 1 Physician Procedures CPT4 Code: [id number] Description: 99213 - WC PHYS LEVEL 3 - EST PT ICD-10 Diagnosis Description L97.212 Non-pressure chronic ulcer of right calf with fat layer exp I89.0 Lymphedema, not elsewhere classified I73.9 Peripheral vascular disease, unspecified Modifier: 25 osed Quantity: 1 CPT4 Code: [id number] Description: 11042 - WC PHYS SUBQ TISS 20 SQ CM ICD-10 Diagnosis Description L97.212 Non-pressure chronic ulcer of right calf with fat layer exp I89.0 Lymphedema, not elsewhere classified I73.9 Peripheral vascular disease, unspecified Modifier: osed Quantity: 1 Electronic Signature(s) Signed: 08/23/2017 9:21:58 AM By: Christin Fudge MD, FACS Entered By: Christin Fudge on 08/23/2017 09:21:57

## 2017-08-26 ENCOUNTER — Ambulatory Visit: Payer: Medicare Other | Admitting: Surgery

## 2017-08-27 NOTE — Progress Notes (Signed)
[name] Other  Clinician: Referring Physician: [name] Treating Physician/Extender: Frann Rider in Treatment: 2 Education Assessment Education Provided To: Patient Education Topics Provided Wound/Skin Impairment: Handouts: Other: change dressing as ordered Methods: Demonstration, Explain/Verbal Responses: State content correctly Electronic Signature(s) Signed: 08/23/2017 4:11:32 PM By: Alric Quan Entered By: Alric Quan on 08/23/2017 [place], [place]. ([id number]) -------------------------------------------------------------------------------- Wound Assessment Details Patient Name: [name], [name] R. Date of Service: 08/23/2017 8:45 AM Medical Record Number: [id number] Patient Account Number: [id number] Date of Birth/Sex: 1946-03-20 (71 y.o. Male) Treating RN: Ahmed Prima Primary Care Boyce Keltner: [name] Other Clinician: Referring Lacie Landry: [name] Treating Evonte Prestage/Extender: Frann Rider in Treatment: 2 Wound Status Wound Number: 2 Primary Venous Leg Ulcer Etiology: Wound Location: Right Lower Leg - Anterior, Distal Wound Status: Open Wounding Event: Gradually Appeared Comorbid Arrhythmia, Hypertension, Type II Diabetes, Date Acquired: 07/18/2017 History: Gout Weeks Of Treatment: 2 Clustered Wound: No Photos Photo Uploaded By: Alric Quan on 08/23/2017 16:06:30 Wound Measurements Length: (cm) 2 Width: (cm) 1.2 Depth: (cm) 0.2 Area: (cm) 1.885 Volume: (cm) 0.377 % Reduction in Area: 33.9% % Reduction in Volume: -32.3% Epithelialization: None Tunneling: No Undermining: No Wound Description Classification: Partial Thickness Wound Margin: Distinct, outline attached Exudate Amount: Large Exudate Type: Serous Exudate Color: amber Foul Odor After Cleansing: No Slough/Fibrino Yes Wound Bed Granulation Amount: Medium (34-66%) Exposed Structure Granulation Quality: Red Fascia Exposed: No Necrotic Amount: Medium  (34-66%) Fat Layer (Subcutaneous Tissue) Exposed: Yes Necrotic Quality: Adherent Slough Tendon Exposed: No Muscle Exposed: No Joint Exposed: No Bone Exposed: No Periwound Skin Texture ODAI, WIMMER R. ([id number]) Texture Color No Abnormalities Noted: No No Abnormalities Noted: No Moisture Temperature / Pain No Abnormalities Noted: No Temperature: No Abnormality Maceration: Yes Tenderness on Palpation: Yes Wound Preparation Ulcer Cleansing: Rinsed/Irrigated with Saline Topical Anesthetic Applied: Other: lidocaine 4%, Treatment Notes Wound #2 (Right, Distal, Anterior Lower Leg) 1. Cleansed with: Clean wound with Normal Saline 2. Anesthetic Topical Lidocaine 4% cream to wound bed prior to debridement 3. Peri-wound Care: Skin Prep 4. Dressing Applied: Santyl Ointment 5. Secondary Dressing Applied Dry [place] Notes stretch netting #5 Electronic Signature(s) Signed: 08/23/2017 4:11:32 PM By: Alric Quan Entered By: Alric Quan on 08/23/2017 09:01:23 CORDELRO, GAUTREAU ([id number]) -------------------------------------------------------------------------------- Vitals Details Patient Name: [name], [name] Date of Service: 08/23/2017 8:45 AM Medical Record Number: [id number] Patient Account Number: [id number] Date of Birth/Sex: [birth year] (71 y.o. Male) Treating RN: Ahmed Prima Primary Care Edlyn Rosenburg: [name] Other Clinician: Referring Charlize Hathaway: [name] Treating Garlon Tuggle/Extender: Frann Rider in Treatment: 2 Vital Signs Time Taken: 08:55 Temperature (F): 97.9 Height (in): 73 Pulse (bpm): 75 Weight (lbs): 243.3 Respiratory Rate (breaths/min): 20 Body Mass Index (BMI): 32.1 Blood Pressure (mmHg): 142/79 Reference Range: 80 - 120 mg / dl Electronic Signature(s) Signed: 08/23/2017 4:11:32 PM By: Alric Quan Entered By: Alric Quan on 08/23/2017 08:56:36  in Area: [2:33.90%] [[name]:[name]] % Reduction in Volume: [2:-32.30%] [[name]:[name]] Classification: [2:Partial Thickness] [[name]:[name]] Exudate Amount: [2:Large] [[name]:[name]] Exudate Type: [2:Serous] [[name]:[name]] Exudate Color: [2:amber] [[name]:[name]] Wound Margin: [2:Distinct, outline attached] [[name]:[name]] Granulation Amount: [2:Medium (34-66%)] [[name]:[name]] Granulation Quality: [2:Red] [[name]:[name]] Necrotic Amount: [2:Medium (34-66%)] [[name]:[name]] Exposed Structures: [2:Fat Layer (Subcutaneous Tissue) Exposed: Yes Fascia: No Tendon: No Muscle: No Joint: No Bone: No] [[name]:[name]] Epithelialization: [2:None] [[name]:[name]] Debridement: [2:Debridement ([id number])] [[name]:[name]] Pre-procedure [2:09:04] [[name]:[name]] Verification/Time Out Taken: Pain Control: [2:Lidocaine 4% Topical Solution] [[name]:[name]] Tissue Debrided: [[name]:[name]] Fibrin/Slough, Exudates, Subcutaneous Level: Skin/Subcutaneous Tissue [name] [name] Debridement Area (sq cm): 2.4 [name] [name] Instrument: Curette [name] [name] Bleeding: Minimum  [name] [name] Hemostasis Achieved: Pressure [name] [name] Procedural Pain: 0 [name] [name] Post Procedural Pain: 0 [name] [name] Debridement Treatment Procedure was tolerated well [name] [name] Response: Post Debridement 2x1.2x0.3 [name] [name] Measurements L x W x D (cm) Post Debridement Volume: 0.565 [name] [name] (cm) Periwound Skin Texture: No Abnormalities Noted [name] [name] Periwound Skin Moisture: Maceration: Yes [name] [name] Periwound Skin Color: No Abnormalities Noted [name] [name] Temperature: No Abnormality [name] [name] Tenderness on Palpation: Yes [name] [name] Wound Preparation: Ulcer Cleansing: [name] [name] Rinsed/Irrigated with Saline Topical Anesthetic Applied: Other: lidocaine 4% Procedures Performed: Debridement [name] [name] Treatment Notes Wound #2 (Right, Distal, Anterior Lower Leg) 1. Cleansed with: Clean wound with Normal Saline 2. Anesthetic Topical Lidocaine 4% cream to wound bed prior to debridement 3. Peri-wound Care: Skin Prep 4. Dressing Applied: Santyl Ointment 5. Secondary Dressing Applied Dry [place] Notes stretch netting #5 Electronic Signature(s) Signed: 08/23/2017 9:19:16 AM By: Christin Fudge MD, FACS Entered By: Christin Fudge on 08/23/2017 09:19:16 KMARION, RAWL ([id number]) -------------------------------------------------------------------------------- [place] Details Patient Name: [name] Date of Service: 08/23/2017 8:45 AM Medical Record Number: [id number] Patient Account Number: [id number] Date of Birth/Sex: 1946-04-30 (71 y.o. Male) Treating RN: Ahmed Prima Primary Care Lavon Bothwell: [name] Other Clinician: Referring Kataryna Mcquilkin: [name] Treating Eiley Mcginnity/Extender: Frann Rider in Treatment: 2 Active Inactive ` Orientation to the Wound Care Program Nursing Diagnoses: Knowledge deficit related to the wound healing center program Goals: Patient/caregiver will verbalize understanding of the [place] Program Date Initiated:  08/08/2017 Target Resolution Date: 09/14/2017 Goal Status: Active Interventions: Provide education on orientation to the wound center Notes: ` Wound/Skin Impairment Nursing Diagnoses: Impaired tissue integrity Knowledge deficit related to ulceration/compromised skin integrity Goals: Ulcer/skin breakdown will have a volume reduction of 80% by week 12 Date Initiated: 08/08/2017 Target Resolution Date: 12/14/2017 Goal Status: Active Interventions: Assess patient/caregiver ability to perform ulcer/skin care regimen upon admission and as needed Assess ulceration(s) every visit Notes: Electronic Signature(s) Signed: 08/23/2017 4:11:32 PM By: Alric Quan Entered By: Alric Quan on 08/23/2017 [place], [place]. ([id number]) -------------------------------------------------------------------------------- Pain Assessment Details Patient Name: RAM, HAUGAN R. Date of Service: 08/23/2017 8:45 AM Medical Record Number: [id number] Patient Account Number: [id number] Date of Birth/Sex: 1946-07-29 (71 y.o. Male) Treating RN: Ahmed Prima Primary Care Loyd Marhefka: [name] Other Clinician: Referring Zona Pedro: [name] Treating Baylin Cabal/Extender: Frann Rider in Treatment: 2 Active Problems Location of Pain Severity and Description of Pain Patient Has Paino No Site Locations Pain Management and Medication Current Pain Management: Electronic Signature(s) Signed: 08/23/2017 4:11:32 PM By: Alric Quan Entered By: Alric Quan on 08/23/2017 08:55:31 [name] ([id number]) -------------------------------------------------------------------------------- Patient/Caregiver Education Details Patient Name: DEFOREST, MAIDEN R. Date of Service: 08/23/2017 8:45 AM Medical Record Number: [id number] Patient Account Number: [id number] Date of Birth/Gender: 10-02-1946 (71 y.o. Male) Treating RN: Ahmed Prima Primary Care Physician: Caryn Section,  [name] Other  Clinician: Referring Physician: [name] Treating Physician/Extender: Frann Rider in Treatment: 2 Education Assessment Education Provided To: Patient Education Topics Provided Wound/Skin Impairment: Handouts: Other: change dressing as ordered Methods: Demonstration, Explain/Verbal Responses: State content correctly Electronic Signature(s) Signed: 08/23/2017 4:11:32 PM By: Alric Quan Entered By: Alric Quan on 08/23/2017 [place], [place]. ([id number]) -------------------------------------------------------------------------------- Wound Assessment Details Patient Name: [name], [name] R. Date of Service: 08/23/2017 8:45 AM Medical Record Number: [id number] Patient Account Number: [id number] Date of Birth/Sex: 1946-03-20 (71 y.o. Male) Treating RN: Ahmed Prima Primary Care Boyce Keltner: [name] Other Clinician: Referring Lacie Landry: [name] Treating Evonte Prestage/Extender: Frann Rider in Treatment: 2 Wound Status Wound Number: 2 Primary Venous Leg Ulcer Etiology: Wound Location: Right Lower Leg - Anterior, Distal Wound Status: Open Wounding Event: Gradually Appeared Comorbid Arrhythmia, Hypertension, Type II Diabetes, Date Acquired: 07/18/2017 History: Gout Weeks Of Treatment: 2 Clustered Wound: No Photos Photo Uploaded By: Alric Quan on 08/23/2017 16:06:30 Wound Measurements Length: (cm) 2 Width: (cm) 1.2 Depth: (cm) 0.2 Area: (cm) 1.885 Volume: (cm) 0.377 % Reduction in Area: 33.9% % Reduction in Volume: -32.3% Epithelialization: None Tunneling: No Undermining: No Wound Description Classification: Partial Thickness Wound Margin: Distinct, outline attached Exudate Amount: Large Exudate Type: Serous Exudate Color: amber Foul Odor After Cleansing: No Slough/Fibrino Yes Wound Bed Granulation Amount: Medium (34-66%) Exposed Structure Granulation Quality: Red Fascia Exposed: No Necrotic Amount: Medium  (34-66%) Fat Layer (Subcutaneous Tissue) Exposed: Yes Necrotic Quality: Adherent Slough Tendon Exposed: No Muscle Exposed: No Joint Exposed: No Bone Exposed: No Periwound Skin Texture ODAI, WIMMER R. ([id number]) Texture Color No Abnormalities Noted: No No Abnormalities Noted: No Moisture Temperature / Pain No Abnormalities Noted: No Temperature: No Abnormality Maceration: Yes Tenderness on Palpation: Yes Wound Preparation Ulcer Cleansing: Rinsed/Irrigated with Saline Topical Anesthetic Applied: Other: lidocaine 4%, Treatment Notes Wound #2 (Right, Distal, Anterior Lower Leg) 1. Cleansed with: Clean wound with Normal Saline 2. Anesthetic Topical Lidocaine 4% cream to wound bed prior to debridement 3. Peri-wound Care: Skin Prep 4. Dressing Applied: Santyl Ointment 5. Secondary Dressing Applied Dry [place] Notes stretch netting #5 Electronic Signature(s) Signed: 08/23/2017 4:11:32 PM By: Alric Quan Entered By: Alric Quan on 08/23/2017 09:01:23 CORDELRO, GAUTREAU ([id number]) -------------------------------------------------------------------------------- Vitals Details Patient Name: [name], [name] Date of Service: 08/23/2017 8:45 AM Medical Record Number: [id number] Patient Account Number: [id number] Date of Birth/Sex: [birth year] (71 y.o. Male) Treating RN: Ahmed Prima Primary Care Edlyn Rosenburg: [name] Other Clinician: Referring Charlize Hathaway: [name] Treating Garlon Tuggle/Extender: Frann Rider in Treatment: 2 Vital Signs Time Taken: 08:55 Temperature (F): 97.9 Height (in): 73 Pulse (bpm): 75 Weight (lbs): 243.3 Respiratory Rate (breaths/min): 20 Body Mass Index (BMI): 32.1 Blood Pressure (mmHg): 142/79 Reference Range: 80 - 120 mg / dl Electronic Signature(s) Signed: 08/23/2017 4:11:32 PM By: Alric Quan Entered By: Alric Quan on 08/23/2017 08:56:36

## 2017-09-02 ENCOUNTER — Other Ambulatory Visit (INDEPENDENT_AMBULATORY_CARE_PROVIDER_SITE_OTHER): Payer: Self-pay | Admitting: Vascular Surgery

## 2017-09-02 DIAGNOSIS — I7090 Unspecified atherosclerosis: Secondary | ICD-10-CM

## 2017-09-02 DIAGNOSIS — I739 Peripheral vascular disease, unspecified: Secondary | ICD-10-CM

## 2017-09-04 ENCOUNTER — Ambulatory Visit (INDEPENDENT_AMBULATORY_CARE_PROVIDER_SITE_OTHER): Payer: Medicare Other

## 2017-09-04 ENCOUNTER — Encounter (INDEPENDENT_AMBULATORY_CARE_PROVIDER_SITE_OTHER): Payer: Self-pay

## 2017-09-04 ENCOUNTER — Other Ambulatory Visit: Payer: Self-pay | Admitting: Surgery

## 2017-09-04 ENCOUNTER — Ambulatory Visit (INDEPENDENT_AMBULATORY_CARE_PROVIDER_SITE_OTHER): Payer: Medicare Other | Admitting: Vascular Surgery

## 2017-09-04 ENCOUNTER — Encounter (INDEPENDENT_AMBULATORY_CARE_PROVIDER_SITE_OTHER): Payer: Self-pay | Admitting: Vascular Surgery

## 2017-09-04 VITALS — BP 150/89 | HR 72 | Resp 16 | Ht 75.0 in | Wt 243.0 lb

## 2017-09-04 DIAGNOSIS — I739 Peripheral vascular disease, unspecified: Secondary | ICD-10-CM | POA: Diagnosis not present

## 2017-09-04 DIAGNOSIS — I872 Venous insufficiency (chronic) (peripheral): Secondary | ICD-10-CM

## 2017-09-04 DIAGNOSIS — I890 Lymphedema, not elsewhere classified: Secondary | ICD-10-CM | POA: Diagnosis not present

## 2017-09-04 DIAGNOSIS — L97212 Non-pressure chronic ulcer of right calf with fat layer exposed: Secondary | ICD-10-CM

## 2017-09-04 NOTE — Progress Notes (Signed)
Subjective:    Patient ID: [name], male    DOB: 01-21-1946, 71 y.o.   MRN: [id number] Chief Complaint  Patient presents with  . New Patient (Initial Visit)    ABI/Arterial Duplex venous   Presents as a new patient referred by Dr. Meyer Russel for evaluation of peripheral artery disease and stasis dermatitis.  The patient endorses a history of wound formation to the right shin approximately 1 month ago.  [place] and felt a burning to the wound area.  The next day, the patient noticed what looked like a "blood blister".  The patient sought medical attention at the wound clinic due to the slow healing nature of the wound.  The patient has been treated with what sounds like debridement and honey.  At this time, the patient does not engage in conservative therapy including wearing medical grade 1 compression stockings.  The patient states he elevates his legs.  Since obtaining care at the wound center, the patient has noticed an improvement in his wound.  The patient underwent a bilateral lower extremity ABI which was notable for medial calcification.  The toe brachial indices are normal.  Bilateral ankle-brachial and toe indices waveforms are normal.  Bilateral triphasic tibials.  The patient underwent a bilateral lower extremity venous reflux exam which was notable for no reflux to the right lower extremity.  Reflux noted in the left common femoral, femoral, popliteal and saphenofemoral junction.  No evidence of deep or superficial vein thrombosis.  The patient denies any fever, nausea vomiting.   Review of Systems  Constitutional: Negative.   HENT: Negative.   Eyes: Negative.   Respiratory: Negative.   Cardiovascular: Negative.   Gastrointestinal: Negative.   Endocrine: Negative.   Genitourinary: Negative.   Skin: Positive for wound.  Allergic/Immunologic: Negative.   Neurological: Negative.   Hematological: Negative.   Psychiatric/Behavioral: Negative.         Objective:   Physical Exam  Constitutional: He is oriented to person, place, and time. He appears well-developed and well-nourished. No distress.  HENT:  Head: Normocephalic and atraumatic.  Eyes: Conjunctivae are normal. Pupils are equal, round, and reactive to light.  Neck: Normal range of motion.  Cardiovascular: Normal rate, regular rhythm, normal heart sounds and intact distal pulses.  Pulses:      Radial pulses are 2+ on the right side, and 2+ on the left side.       Dorsalis pedis pulses are 1+ on the right side, and 1+ on the left side.       Posterior tibial pulses are 1+ on the right side, and 1+ on the left side.  Pulmonary/Chest: Effort normal and breath sounds normal.  Musculoskeletal: Normal range of motion. He exhibits no edema.  Neurological: He is alert and oriented to person, place, and time.  Skin: He is not diaphoretic.  Bilateral severe stasis dermatitis noted bilaterally.  The right shin wound is covered with a wound care dressing.  The patient does not want me to remove the dressing.  There is no cellulitis noted bilaterally.   Psychiatric: He has a normal mood and affect. His behavior is normal. Judgment and thought content normal.  Vitals reviewed.  BP (!) 150/89 (BP Location: Right Arm, Patient Position: Sitting)   Pulse 72   Resp 16   Ht 6\' 3"  (1.905 m)   Wt 243 lb (110.2 kg)   BMI 30.37 kg/m   Past Medical History:  Diagnosis Date  .  BPH with obstruction/lower urinary tract symptoms   . Chronic atrial fibrillation (HCC)   . Dislocation of shoulder, anterior, right, closed 07/24/2014  . Echocardiogram abnormal    2009 moderate to severely dilated left atrium  . Erectile dysfunction   . Essential hypertension   . Gout   . History of chicken pox   . History of measles   . Hyperglycemia   . Hypogonadism in male   . Nocturia   . Peyronie's disease   . Traumatic tear of right rotator cuff 07/24/2014  . Venous stasis    chronic   Social History    Socioeconomic History  . Marital status: Single    Spouse name: Not on file  . Number of children: 1  . Years of education: Not on file  . Highest education level: Not on file  Social Needs  . Financial resource strain: Not on file  . Food insecurity - worry: Not on file  . Food insecurity - inability: Not on file  . Transportation needs - medical: Not on file  . Transportation needs - non-medical: Not on file  Occupational History  . Occupation: retired Runner, broadcasting/film/video  Tobacco Use  . Smoking status: Never Smoker  . Smokeless tobacco: Never Used  Substance and Sexual Activity  . Alcohol use: Yes    Alcohol/week: 0.0 oz    Comment: 2-3 beers most days  . Drug use: No  . Sexual activity: Not on file  Other Topics Concern  . Not on file  Social History Narrative   Divorced father of 1. Retired Engineer, site.   ~2 beers / day. Does not smoke.   Works out 6 days a weeks - cardio & weights. He used to run and play basketball prior to his hip surgery in 2007.   Past Surgical History:  Procedure Laterality Date  . CARDIAC CATHETERIZATION  03/28/2004   normal coronaries, reduced EF at 25-30% (Dr. Laurell Josephs)  . CARDIOVERSION, TEE guided  03/29/2004   Dr. Laurell Josephs   . COLONOSCOPY  05/28/2013   Dr. Shelle Iron  . HERNIA REPAIR  age 41  . KNEE SURGERY    . LUMBAR FUSION  age 34  . MYOVIEW CARDIOVASCULAR STRESS TEST  07/2003   anterior wall thinning, LV systolic function depressed at 33%  . PILONIDAL CYST EXCISION    . Right Eyebrow Tumor Removed  age 26   . SHOULDER ARTHROSCOPY WITH ROTATOR CUFF REPAIR AND SUBACROMIAL DECOMPRESSION Right 08/19/2014   Procedure: SHOULDER ARTHROSCOPY WITH ROTATOR CUFF REPAIR AND SUBACROMIAL DECOMPRESSION;  Surgeon: Nilda Simmer, MD;  Location: [facility];  Service: Orthopedics;  Laterality: Right;  . TOTAL HIP ARTHROPLASTY Right 03/25/2006   Dr. Carmon Ginsberg. [place]  . TRANSTHORACIC ECHOCARDIOGRAM  08/2008   EF=>55%, mild-mod dilated; LA  mod-severely dilated; RA mild-mod dilated; mild calcif of MV, borderline MVP, trace MR; trace TR; mild pulm valve regurg    Family History  Problem Relation Age of Onset  . Hypertension Father   . Prostate cancer Father   . Bladder Cancer Father   . Hypertension Mother   . Stroke Maternal Grandfather   . Diabetes Paternal Grandfather   . Supraventricular tachycardia Brother   . Arthritis Sister        RA  . Diabetes Maternal Uncle        TYPE 2  . Supraventricular tachycardia Child   . Kidney disease Neg Hx   . Heart attack Neg Hx   . Colon cancer Neg  Hx    No Known Allergies     Assessment & Plan:  Presents as a new patient referred by Dr. Meyer Russel for evaluation of peripheral artery disease and stasis dermatitis.  The patient endorses a history of wound formation to the right shin approximately 1 month ago.  The patient was at the beach and felt a burning to the wound area.  The next day, the patient noticed what looked like a "blood blister".  The patient sought medical attention at the wound clinic due to the slow healing nature of the wound.  The patient has been treated with what sounds like debridement and honey.  At this time, the patient does not engage in conservative therapy including wearing medical grade 1 compression stockings.  The patient states he elevates his legs.  Since obtaining care at the wound center, the patient has noticed an improvement in his wound.  The patient underwent a bilateral lower extremity ABI which was notable for medial calcification.  The toe brachial indices are normal.  Bilateral ankle-brachial and toe indices waveforms are normal.  Bilateral triphasic tibials.  The patient underwent a bilateral lower extremity venous reflux exam which was notable for no reflux to the right lower extremity.  Reflux noted in the left common femoral, femoral, popliteal and saphenofemoral junction.  No evidence of deep or superficial vein thrombosis.  The patient denies  any fever, nausea vomiting.  1. Peripheral vascular disease (HCC) - New Patient without claudication-like symptoms or rest pain. Normal ABI today The patient notes improvement to his wound since receiving local wound care from the wound center There is no indication for intervention at this time Recommend follow-up every 2-3 years with an ABI The patient expresses his understanding and will call in the future if he would like to pursue this.  2. Chronic venous insufficiency - New Venous insufficiency to the deep system of the left lower extremity I encouraged the patient to start wearing medical grade 1 compression stockings on a daily basis.  He was instructed to place them in the morning and remove them in the evening.  He does not lead to sleep in his compression stockings. The patient was encouraged to elevate his legs heart level higher The patient is not a candidate for laser ablation as his reflux is located in the deep system.  3. Lymphedema - New Patient with lymphedema to the bilateral lower extremity Patient with severe stasis dermatitis noted bilaterally Patient with healing right lower extremity wound Patient was encouraged to start wearing medical grade 1 compression stockings and engaging in elevation on a daily basis We discussed the addition of a lymphedema pump as an added therapy to control his lymphedema At this time, the patient is not interested in moving forward with this therapy The patient will call our office if he changes his mind in the future  Current Outpatient Medications on File Prior to Visit  Medication Sig Dispense Refill  . benazepril (LOTENSIN) 20 MG tablet take 1 tablet by mouth twice a day 60 tablet 9  . carvedilol (COREG) 12.5 MG tablet take 1 tablet by mouth twice a day 60 tablet 6  . chlorthalidone (HYGROTON) 25 MG tablet Take 1 tablet (25 mg total) by mouth daily. 30 tablet 11  . Multiple Vitamin (MULTIVITAMIN) tablet Take 1 tablet by mouth  daily.      Carlena Hurl 20 MG TABS tablet take 1 tablet by mouth once daily WITH SUPPER 90 tablet 1   No current  facility-administered medications on file prior to visit.    There are no Patient Instructions on file for this visit. No Follow-up on file.  Braian Tijerina A Natalee Tomkiewicz, PA-C

## 2017-09-05 ENCOUNTER — Encounter: Payer: Medicare Other | Admitting: Surgery

## 2017-09-05 DIAGNOSIS — L97212 Non-pressure chronic ulcer of right calf with fat layer exposed: Secondary | ICD-10-CM | POA: Diagnosis not present

## 2017-09-06 NOTE — Progress Notes (Signed)
[name] ([id number]) Visit Report for 09/05/2017 Arrival Information Details Patient Name: [name], [name] Date of Service: 09/05/2017 8:45 AM Medical Record Number: [id number] Patient Account Number: [id number] Date of Birth/Sex: [birth year] (71 y.o. Male) Treating RN: Phillis Haggis Primary Care Lorea Kupfer: [name] Other Clinician: Referring Kijana Cromie: [name] Treating Mabry Tift/Extender: Rudene Re in Treatment: 4 Visit Information History Since Last Visit All ordered tests and consults were completed: No Patient Arrived: Ambulatory Added or deleted any medications: No Arrival Time: 08:47 Any new allergies or adverse reactions: No Accompanied By: self Had a fall or experienced change in No Transfer Assistance: None activities of daily living that may affect Patient Identification Verified: Yes risk of falls: Secondary Verification Process Yes Signs or symptoms of abuse/neglect since last visito No Completed: Hospitalized since last visit: No Patient Requires Transmission-Based No Has Dressing in Place as Prescribed: Yes Precautions: Pain Present Now: No Patient Has Alerts: Yes Patient Alerts: Patient on Blood Thinner Xarelto R ABI non- compressible L ABI non- compressible Electronic Signature(s) Signed: 09/05/2017 2:54:02 PM By: Alejandro Mulling Entered By: Alejandro Mulling on 09/05/2017 08:47:24 [name] ([id number]) -------------------------------------------------------------------------------- Encounter Discharge Information Details Patient Name: [name], [name] Date of Service: 09/05/2017 8:45 AM Medical Record Number: [id number] Patient Account Number: [id number] Date of Birth/Sex: [birth year] (71 y.o. Male) Treating RN: Phillis Haggis Primary Care Durinda Buzzelli: [name] Other Clinician: Referring Epsie Walthall: [name] Treating Ivanna Kocak/Extender: Rudene Re in Treatment: 4 Encounter Discharge Information  Items Discharge Pain Level: 0 Discharge Condition: Stable Ambulatory Status: Ambulatory Discharge Destination: Home Private Transportation: Auto Accompanied By: self Schedule Follow-up Appointment: Yes Medication Reconciliation completed and provided No to Patient/Care Tecumseh Yeagley: Clinical Summary of Care: Electronic Signature(s) Signed: 09/05/2017 8:54:51 AM By: Alejandro Mulling Entered By: Alejandro Mulling on 09/05/2017 08:54:51 [name] ([id number]) -------------------------------------------------------------------------------- Lower Extremity Assessment Details Patient Name: SEBASTIAAN, EICHENAUER R. Date of Service: 09/05/2017 8:45 AM Medical Record Number: [id number] Patient Account Number: [id number] Date of Birth/Sex: [birth year] (71 y.o. Male) Treating RN: Phillis Haggis Primary Care Tykee Heideman: [name] Other Clinician: Referring Kerline Trahan: [name] Treating Anabell Swint/Extender: Rudene Re in Treatment: 4 Edema Assessment Assessed: [Left: No] [Right: No] [Left: Edema] [Right: :] Calf Left: Right: Point of Measurement: 40 cm From Medial Instep 40.5 cm 38 cm Ankle Left: Right: Point of Measurement: 12 cm From Medial Instep 24.5 cm 25 cm Vascular Assessment Pulses: Dorsalis Pedis Palpable: [Right:Yes] Posterior Tibial Extremity colors, hair growth, and conditions: Extremity Color: [Right:Hyperpigmented] Temperature of Extremity: [Right:Warm] Capillary Refill: [Right:< 3 seconds] Toe Nail Assessment Left: Right: Thick: Yes Discolored: Yes Deformed: No Improper Length and Hygiene: Yes Electronic Signature(s) Signed: 09/05/2017 2:54:02 PM By: Alejandro Mulling Entered By: Alejandro Mulling on 09/05/2017 09:24:39 [name] ([id number]) -------------------------------------------------------------------------------- Multi Wound Chart Details Patient Name: [name] R. Date of Service: 09/05/2017 8:45 AM Medical Record Number:  [id number] Patient Account Number: [id number] Date of Birth/Sex: [birth year] (71 y.o. Male) Treating RN: Phillis Haggis Primary Care Nyimah Shadduck: [name] Other Clinician: Referring Faatimah Spielberg: [name] Treating Caitlen Worth/Extender: Rudene Re in Treatment: 4 Vital Signs Height(in): 73 Pulse(bpm): 63 Weight(lbs): 243.3 Blood Pressure(mmHg): 129/80 Body Mass Index(BMI): 32 Temperature(F): 98.3 Respiratory Rate 20 (breaths/min): Photos: [2:No Photos] [[name]:[name]] Wound Location: [2:Right Lower Leg - Anterior, Distal] [[name]:[name]] Wounding Event: [2:Gradually Appeared] [[name]:[name]] Primary Etiology: [2:Venous Leg Ulcer] [[name]:[name]] Comorbid History: [2:Arrhythmia, Hypertension, Type II Diabetes, Gout] [[name]:[name]] Date Acquired: [2:07/18/2017] [[name]:[name]] Weeks of Treatment: [2:4] [[name]:[name]] Wound Status: [2:Open] [[name]:[name]] Measurements L x W x D [2:1.5x1x0.2] [[name]:[name]] (cm) Area (  cm) : [2:1.178] [[name]:[name]] Volume (cm) : [2:0.236] [[name]:[name]] % Reduction in Area: [2:58.70%] [[name]:[name]] % Reduction in Volume: [2:17.20%] [[name]:[name]] Classification: [2:Partial Thickness] [[name]:[name]] Exudate Amount: [2:Large] [[name]:[name]] Exudate Type: [2:Serous] [[name]:[name]] Exudate Color: [2:amber] [[name]:[name]] Wound Margin: [2:Distinct, outline attached] [[name]:[name]] Granulation Amount: [2:Small (1-33%)] [[name]:[name]] Granulation Quality: [2:Red] [[name]:[name]] Necrotic Amount: [2:Large (67-100%)] [[name]:[name]] Exposed Structures: [2:Fat Layer (Subcutaneous Tissue) Exposed: Yes Fascia: No Tendon: No Muscle: No Joint: No Bone: No] [[name]:[name]] Epithelialization: [2:None] [[name]:[name]] Debridement: [2:Debridement (11042-11047)] [[name]:[name]] Pre-procedure [2:09:14] [[name]:[name]] Verification/Time Out Taken: Pain Control: [2:Lidocaine 4% Topical Solution] [[name]:[name]] Tissue Debrided: [[name]:[name]] Fibrin/Slough, Exudates, Subcutaneous Level: Skin/Subcutaneous Tissue [name] [name] Debridement Area (sq cm): 1.5 [name] [name] Instrument: Curette [name]  [name] Bleeding: Minimum [name] [name] Hemostasis Achieved: Pressure [name] [name] Procedural Pain: 0 [name] [name] Post Procedural Pain: 0 [name] [name] Debridement Treatment Procedure was tolerated well [name] [name] Response: Post Debridement 1.5x1x0.3 [name] [name] Measurements L x W x D (cm) Post Debridement Volume: 0.353 [name] [name] (cm) Periwound Skin Texture: No Abnormalities Noted [name] [name] Periwound Skin Moisture: Maceration: Yes [name] [name] Periwound Skin Color: No Abnormalities Noted [name] [name] Temperature: No Abnormality [name] [name] Tenderness on Palpation: Yes [name] [name] Wound Preparation: Ulcer Cleansing: [name] [name] Rinsed/Irrigated with Saline Topical Anesthetic Applied: Other: lidocaine 4% Procedures Performed: Debridement [name] [name] Treatment Notes Wound #2 (Right, Distal, Anterior Lower Leg) 1. Cleansed with: Clean wound with Normal Saline 2. Anesthetic Topical Lidocaine 4% cream to wound bed prior to debridement 3. Peri-wound Care: Skin Prep 4. Dressing Applied: Other dressing (specify in notes) 5. Secondary Dressing Applied [place] Notes stretch netting #5, silvercel Electronic Signature(s) Signed: 09/05/2017 9:41:20 AM By: Evlyn Kanner MD, FACS Entered By: Evlyn Kanner on 09/05/2017 09:41:20 SEGUNDO, NEVEL ([id number]) -------------------------------------------------------------------------------- Multi-Disciplinary Care Plan Details Patient Name: [name], [name] Date of Service: 09/05/2017 8:45 AM Medical Record Number: [id number] Patient Account Number: [id number] Date of Birth/Sex: 06-26-1946 (71 y.o. Male) Treating RN: Phillis Haggis Primary Care Renn Dirocco: [name] Other Clinician: Referring Aniket Paye: [name] Treating Pasco Marchitto/Extender: Rudene Re in Treatment: 4 Active Inactive ` Orientation to the Wound Care Program Nursing Diagnoses: Knowledge deficit related to the wound healing center program Goals: Patient/caregiver will verbalize understanding of the  Wound Healing Center Program Date Initiated: 08/08/2017 Target Resolution Date: 09/14/2017 Goal Status: Active Interventions: Provide education on orientation to the wound center Notes: ` Wound/Skin Impairment Nursing Diagnoses: Impaired tissue integrity Knowledge deficit related to ulceration/compromised skin integrity Goals: Ulcer/skin breakdown will have a volume reduction of 80% by week 12 Date Initiated: 08/08/2017 Target Resolution Date: 12/14/2017 Goal Status: Active Interventions: Assess patient/caregiver ability to perform ulcer/skin care regimen upon admission and as needed Assess ulceration(s) every visit Notes: Electronic Signature(s) Signed: 09/05/2017 2:54:02 PM By: Alejandro Mulling Entered By: Alejandro Mulling on 09/05/2017 08:53:37 Babineaux, [name] ([id number]) -------------------------------------------------------------------------------- Pain Assessment Details Patient Name: [name], [name] R. Date of Service: 09/05/2017 8:45 AM Medical Record Number: [id number] Patient Account Number: [id number] Date of Birth/Sex: Jun 16, 1946 (71 y.o. Male) Treating RN: Phillis Haggis Primary Care Jacoria Keiffer: [name] Other Clinician: Referring Maleta Pacha: [name] Treating Keygan Dumond/Extender: Rudene Re in Treatment: 4 Active Problems Location of Pain Severity and Description of Pain Patient Has Paino No Site Locations Pain Management and Medication Current Pain Management: Electronic Signature(s) Signed: 09/05/2017 2:54:02 PM By: Alejandro Mulling Entered By: Alejandro Mulling on 09/05/2017 08:47:28 [name] ([id number]) -------------------------------------------------------------------------------- Patient/Caregiver Education Details Patient Name: BARTOLOMEO, SACHAR. Date of Service: 09/05/2017 8:45 AM Medical Record Number: [id number] Patient Account Number: [id number] Date of  Birth/Gender: January 29, 1946 (71 y.o. Male) Treating RN: Phillis Haggis Primary Care Physician: [name] Other Clinician: Referring Physician: [name] Treating Physician/Extender: Rudene Re in Treatment: 4 Education Assessment Education Provided To: Patient Education Topics Provided Wound/Skin Impairment: Handouts: Other: change dressing as ordered Methods: Demonstration, Explain/Verbal Responses: State content correctly Electronic Signature(s) Signed: 09/05/2017 2:54:02 PM By: Alejandro Mulling Entered By: Alejandro Mulling on 09/05/2017 08:55:05 [name] ([id number]) -------------------------------------------------------------------------------- Wound Assessment Details Patient Name: [name], [name] R. Date of Service: 09/05/2017 8:45 AM Medical Record Number: [id number] Patient Account Number: [id number] Date of Birth/Sex: 12-31-1945 (71 y.o. Male) Treating RN: Phillis Haggis Primary Care Jazzlene Huot: [name] Other Clinician: Referring Keary Hanak: [name] Treating Lavonda Thal/Extender: Rudene Re in Treatment: 4 Wound Status Wound Number: 2 Primary Venous Leg Ulcer Etiology: Wound Location: Right Lower Leg - Anterior, Distal Wound Status: Open Wounding Event: Gradually Appeared Comorbid Arrhythmia, Hypertension, Type II Diabetes, Date Acquired: 07/18/2017 History: Gout Weeks Of Treatment: 4 Clustered Wound: No Photos Photo Uploaded By: Alejandro Mulling on 09/05/2017 11:44:14 Wound Measurements Length: (cm) 1.5 Width: (cm) 1 Depth: (cm) 0.2 Area: (cm) 1.178 Volume: (cm) 0.236 % Reduction in Area: 58.7% % Reduction in Volume: 17.2% Epithelialization: None Tunneling: No Undermining: No Wound Description Classification: Partial Thickness Wound Margin: Distinct, outline attached Exudate Amount: Large Exudate Type: Serous Exudate Color: amber Foul Odor After Cleansing: No Slough/Fibrino Yes Wound Bed Granulation Amount: Small (1-33%) Exposed Structure Granulation Quality:  Red Fascia Exposed: No Necrotic Amount: Large (67-100%) Fat Layer (Subcutaneous Tissue) Exposed: Yes Necrotic Quality: Adherent Slough Tendon Exposed: No Muscle Exposed: No Joint Exposed: No Bone Exposed: No Periwound Skin Texture [name], [name] R. ([id number]) Texture Color No Abnormalities Noted: No No Abnormalities Noted: No Moisture Temperature / Pain No Abnormalities Noted: No Temperature: No Abnormality Maceration: Yes Tenderness on Palpation: Yes Wound Preparation Ulcer Cleansing: Rinsed/Irrigated with Saline Topical Anesthetic Applied: Other: lidocaine 4%, Treatment Notes Wound #2 (Right, Distal, Anterior Lower Leg) 1. Cleansed with: Clean wound with Normal Saline 2. Anesthetic Topical Lidocaine 4% cream to wound bed prior to debridement 3. Peri-wound Care: Skin Prep 4. Dressing Applied: Other dressing (specify in notes) 5. Secondary Dressing Applied [place] Notes stretch netting #5, silvercel Electronic Signature(s) Signed: 09/05/2017 2:54:02 PM By: Alejandro Mulling Entered By: Alejandro Mulling on 09/05/2017 08:51:50 [name] ([id number]) -------------------------------------------------------------------------------- Vitals Details Patient Name: [name], [name] R. Date of Service: 09/05/2017 8:45 AM Medical Record Number: [id number] Patient Account Number: [id number] Date of Birth/Sex: 1946/05/29 (71 y.o. Male) Treating RN: Phillis Haggis Primary Care Rachal Dvorsky: [name] Other Clinician: Referring Judeth Gilles: [name] Treating Verity Gilcrest/Extender: Rudene Re in Treatment: 4 Vital Signs Time Taken: 08:47 Temperature (F): 98.3 Height (in): 73 Pulse (bpm): 63 Weight (lbs): 243.3 Respiratory Rate (breaths/min): 20 Body Mass Index (BMI): 32.1 Blood Pressure (mmHg): 129/80 Reference Range: 80 - 120 mg / dl Electronic Signature(s) Signed: 09/05/2017 2:54:02 PM By: Alejandro Mulling Entered By: Alejandro Mulling on 09/05/2017  08:49:31

## 2017-09-06 NOTE — Progress Notes (Addendum)
[name] ([id number]) Visit Report for 09/05/2017 Chief Complaint Document Details Patient Name: [name], [name] Date of Service: 09/05/2017 8:45 AM Medical Record Number: [id number] Patient Account Number: [id number] Date of Birth/Sex: 02/27/1946 (71 y.o. Male) Treating RN: Ahmed Prima Primary Care Provider: [name] Other Clinician: Referring Provider: [name] Treating Provider/Extender: Frann Rider in Treatment: 4 Information Obtained from: Patient Chief Complaint Patient seen for complaints of Non-Healing Wound to the right lower extremity medial calf area which he's had for about 3 weeks Electronic Signature(s) Signed: 09/05/2017 9:41:35 AM By: Christin Fudge MD, FACS Entered By: Christin Fudge on 09/05/2017 [name], [place]. ([id number]) -------------------------------------------------------------------------------- Debridement Details Patient Name: [name], [name] Date of Service: 09/05/2017 8:45 AM Medical Record Number: [id number] Patient Account Number: [id number] Date of Birth/Sex: January 10, 1946 (71 y.o. Male) Treating RN: Ahmed Prima Primary Care Provider: [name] Other Clinician: Referring Provider: [name] Treating Provider/Extender: Frann Rider in Treatment: 4 Debridement Performed for Wound #2 Right,Distal,Anterior Lower Leg Assessment: Performed By: Physician Christin Fudge, MD Debridement: Debridement Severity of Tissue Pre Fat layer exposed Debridement: Pre-procedure Verification/Time Yes - 09:14 Out Taken: Start Time: 09:15 Pain Control: Lidocaine 4% Topical Solution Level: Skin/Subcutaneous Tissue Total Area Debrided (L x W): 1.5 (cm) x 1 (cm) = 1.5 (cm) Tissue and other material Viable, Non-Viable, Exudate, Fibrin/Slough, Subcutaneous debrided: Instrument: Curette Bleeding: Minimum Hemostasis Achieved: Pressure End Time: 09:17 Procedural Pain: 0 Post Procedural Pain: 0 Response  to Treatment: Procedure was tolerated well Post Debridement Measurements of Total Wound Length: (cm) 1.5 Width: (cm) 1 Depth: (cm) 0.3 Volume: (cm) 0.353 Character of Wound/Ulcer Post Debridement: Requires Further Debridement Severity of Tissue Post Debridement: Fat layer exposed Post Procedure Diagnosis Same as Pre-procedure Electronic Signature(s) Signed: 09/05/2017 9:41:28 AM By: Christin Fudge MD, FACS Signed: 09/05/2017 2:54:02 PM By: Alric Quan Entered By: Christin Fudge on 09/05/2017 09:41:27 [name] ([id number]) -------------------------------------------------------------------------------- HPI Details Patient Name: [name] [name] Date of Service: 09/05/2017 8:45 AM Medical Record Number: [id number] Patient Account Number: [id number] Date of Birth/Sex: 1946/04/28 (71 y.o. Male) Treating RN: Ahmed Prima Primary Care Provider: [name] Other Clinician: Referring Provider: [name] Treating Provider/Extender: Frann Rider in Treatment: 4 History of Present Illness Location: right upper calf medially Quality: Patient reports experiencing a sharp pain to affected area(s). Severity: Patient states wound are getting worse. Duration: Patient has had the wound for < 3 weeks prior to presenting for treatment Timing: Pain in wound is Intermittent (comes and goes Context: The wound would happen gradually Modifying Factors: Other treatment(s) tried include:local care with an antibiotic ointment Associated Signs and Symptoms: Patient reports having increase swelling. HPI Description: 71 year old male who has a past medical history of essential hypertension, chronic atrial fibrillation, peripheral vascular disease, nonischemic cardiomyopathy,venous stasis dermatitis, gouty arthropathy, basal cell carcinoma of the right lower extremity, benign prostatic hypertrophy, long-term use of anticoagulation therapy, hyperglycemia and exercise intolerance  has never been a smoker. the patient has had a vascular workup over 7 years ago and said everything was normal at that stage. He does not have any chronic problems except for cardiac issues which he sees a cardiologist in [place]. 08/15/2017 -- arterial and venous duplex studies still pending. 08/23/2017 -- venous reflux studies done on 08/13/2017 shows venous incompetence throughout the left lower extremity deep system and focally at the left saphenofemoral junction. 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 No evidence of SVT or DVT in bilateral lower extremities The patient has an appointment  at the end of the month to get his arterial duplex study done 09/05/2017 -- the patient was seen at the vein and vascular office yesterday by Angelena Form. ABI studies were notable for medial calcification and the toe brachial indices were normal and bilateral ankle-brachial) waveforms were normal with triphasic flow. after review of his venous studies he was not a candidate for laser ablation and his lymphedema was to be treated with compression stockings and lymphedema pump pumps Electronic Signature(s) Signed: 09/05/2017 9:41:42 AM By: Christin Fudge MD, FACS Previous Signature: 09/05/2017 9:21:19 AM Version By: Christin Fudge MD, FACS Previous Signature: 09/05/2017 9:11:04 AM Version By: Christin Fudge MD, FACS Entered By: Christin Fudge on 09/05/2017 [name] ([id number]) -------------------------------------------------------------------------------- Physical Exam Details Patient Name: [name], [name] Date of Service: 09/05/2017 8:45 AM Medical Record Number: [id number] Patient Account Number: [id number] Date of Birth/Sex: 1946/03/27 (71 y.o. Male) Treating RN: Ahmed Prima Primary Care Provider: [name] Other Clinician: Referring Provider: [name] Treating Provider/Extender: Frann Rider in Treatment: 4 Constitutional . Pulse  regular. Respirations normal and unlabored. Afebrile. . Eyes Nonicteric. Reactive to light. Ears, Nose, Mouth, and Throat Lips, teeth, and gums WNL.Marland Kitchen Moist mucosa without lesions. Neck supple and nontender. No palpable supraclavicular or cervical adenopathy. Normal sized without goiter. Respiratory WNL. No retractions.. Cardiovascular Pedal Pulses WNL. No clubbing, cyanosis or edema. Lymphatic No adneopathy. No adenopathy. No adenopathy. Musculoskeletal Adexa without tenderness or enlargement.. Digits and nails w/o clubbing, cyanosis, infection, petechiae, ischemia, or inflammatory conditions.. Integumentary (Hair, Skin) No suspicious lesions. No crepitus or fluctuance. No peri-wound warmth or erythema. No masses.Marland Kitchen Psychiatric Judgement and insight Intact.. No evidence of depression, anxiety, or agitation.. Notes after sharp debridement of the wound with a #3 curet the base of the wound is looking very clean and we will change the dressing over to silver alginate Electronic Signature(s) Signed: 09/05/2017 9:42:12 AM By: Christin Fudge MD, FACS Entered By: Christin Fudge on 09/05/2017 09:42:11 [name], [name] ([id number]) -------------------------------------------------------------------------------- Physician Orders Details Patient Name: [name], [name] Date of Service: 09/05/2017 8:45 AM Medical Record Number: [id number] Patient Account Number: [id number] Date of Birth/Sex: 01-Sep-1946 (71 y.o. Male) Treating RN: Ahmed Prima Primary Care Provider: [name] Other Clinician: Referring Provider: [name] Treating Provider/Extender: Frann Rider in Treatment: 4 Verbal / Phone Orders: Yes ClinicianCarolyne Fiscal, Debi Read Back and Verified: Yes Diagnosis Coding Wound Cleansing Wound #2 Right,Distal,Anterior Lower Leg o Clean wound with Normal Saline. o Cleanse wound with mild soap and water o May Shower, gently pat wound dry prior to applying new  dressing. Anesthetic Wound #2 Right,Distal,Anterior Lower Leg o Topical Lidocaine 4% cream applied to wound bed prior to debridement Primary Wound Dressing Wound #2 Right,Distal,Anterior Lower Leg o Silvercel Non-Adherent Secondary Dressing Wound #2 Right,Distal,Anterior Lower Leg o Dry Gauze o Other - telfa island, stretch netting #5 Dressing Change Frequency Wound #2 Right,Distal,Anterior Lower Leg o Change dressing every other day. Follow-up Appointments Wound #2 Right,Distal,Anterior Lower Leg o Return Appointment in 1 week. Edema Control Wound #2 Right,Distal,Anterior Lower Leg o Elevate legs to the level of the heart and pump ankles as often as possible o Support Garment 30-40 mm/Hg pressure to: - pt to purchase and wear own Additional Orders / Instructions Wound #2 Right,Distal,Anterior Lower Leg o Increase protein intake. Medications-please add to medication list. Wound #2 Right,Distal,Anterior Lower Leg o Other: - Vitamin C, Zinc, MVI HERSHY, FLENNER ([id number]) Electronic Signature(s) Signed: 09/05/2017 2:54:02 PM By: Alric Quan Signed: 09/05/2017 4:36:05 PM  By: Christin Fudge MD, FACS Entered By: Alric Quan on 09/05/2017 Grimsley, [place] ([id number]) -------------------------------------------------------------------------------- Problem List Details Patient Name: [name], [name] Date of Service: 09/05/2017 8:45 AM Medical Record Number: [id number] Patient Account Number: [id number] Date of Birth/Sex: [birth year] (71 y.o. Male) Treating RN: Ahmed Prima Primary Care Provider: [name] Other Clinician: Referring Provider: [name] Treating Provider/Extender: Frann Rider in Treatment: 4 Active Problems ICD-10 Encounter Code Description Active Date Diagnosis L97.212 Non-pressure chronic ulcer of right calf with fat layer exposed 08/08/2017 Yes I89.0 Lymphedema, not elsewhere classified 08/08/2017  Yes I73.9 Peripheral vascular disease, unspecified 08/08/2017 Yes Inactive Problems Resolved Problems Electronic Signature(s) Signed: 09/05/2017 9:41:16 AM By: Christin Fudge MD, FACS Entered By: Christin Fudge on 09/05/2017 09:41:15 [name] ([id number]) -------------------------------------------------------------------------------- Progress Note Details Patient Name: [name] Date of Service: 09/05/2017 8:45 AM Medical Record Number: [id number] Patient Account Number: [id number] Date of Birth/Sex: Mar 04, 1946 (71 y.o. Male) Treating RN: Ahmed Prima Primary Care Provider: [name] Other Clinician: Referring Provider: [name] Treating Provider/Extender: Frann Rider in Treatment: 4 Subjective Chief Complaint Information obtained from Patient Patient seen for complaints of Non-Healing Wound to the right lower extremity medial calf area which he's had for about 3 weeks History of Present Illness (HPI) The following HPI elements were documented for the patient's wound: Location: right upper calf medially Quality: Patient reports experiencing a sharp pain to affected area(s). Severity: Patient states wound are getting worse. Duration: Patient has had the wound for < 3 weeks prior to presenting for treatment Timing: Pain in wound is Intermittent (comes and goes Context: The wound would happen gradually Modifying Factors: Other treatment(s) tried include:local care with an antibiotic ointment Associated Signs and Symptoms: Patient reports having increase swelling. 71 year old male who has a past medical history of essential hypertension, chronic atrial fibrillation, peripheral vascular disease, nonischemic cardiomyopathy,venous stasis dermatitis, gouty arthropathy, basal cell carcinoma of the right lower extremity, benign prostatic hypertrophy, long-term use of anticoagulation therapy, hyperglycemia and exercise intolerance has never been a  smoker. the patient has had a vascular workup over 7 years ago and said everything was normal at that stage. He does not have any chronic problems except for cardiac issues which he sees a cardiologist in [place]. 08/15/2017 -- arterial and venous duplex studies still pending. 08/23/2017 -- venous reflux studies done on 08/13/2017 shows venous incompetence throughout the left lower extremity deep system and focally at the left saphenofemoral junction. 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 No evidence of SVT or DVT in bilateral lower extremities The patient has an appointment at the end of the month to get his arterial duplex study done 09/05/2017 -- the patient was seen at the vein and vascular office yesterday by Angelena Form. ABI studies were notable for medial calcification and the toe brachial indices were normal and bilateral ankle-brachial) waveforms were normal with triphasic flow. after review of his venous studies he was not a candidate for laser ablation and his lymphedema was to be treated with compression stockings and lymphedema pump pumps Patient History Information obtained from Patient. Family History Cancer - Father, Diabetes - Paternal Grandparents, Heart Disease - Father, Hypertension - Mother,Father,Siblings, No family history of Hereditary Spherocytosis, Kidney Disease, Lung Disease, Seizures, Stroke, Thyroid Problems, Tuberculosis. Social History Never smoker, Marital Status - Divorced, Alcohol Use - Rarely, Drug Use - No History, Caffeine Use - Never. [name], [name] ([id number]) Objective Constitutional Pulse regular. Respirations normal and unlabored. Afebrile. Vitals Time Taken:  8:47 AM, Height: 73 in, Weight: 243.3 lbs, BMI: 32.1, Temperature: 98.3 F, Pulse: 63 bpm, Respiratory Rate: 20 breaths/min, Blood Pressure: 129/80 mmHg. Eyes Nonicteric. Reactive to light. Ears, Nose, Mouth, and Throat Lips, teeth, and gums WNL.Marland Kitchen Moist  mucosa without lesions. Neck supple and nontender. No palpable supraclavicular or cervical adenopathy. Normal sized without goiter. Respiratory WNL. No retractions.. Cardiovascular Pedal Pulses WNL. No clubbing, cyanosis or edema. Lymphatic No adneopathy. No adenopathy. No adenopathy. Musculoskeletal Adexa without tenderness or enlargement.. Digits and nails w/o clubbing, cyanosis, infection, petechiae, ischemia, or inflammatory conditions.Marland Kitchen Psychiatric Judgement and insight Intact.. No evidence of depression, anxiety, or agitation.. General Notes: after sharp debridement of the wound with a #3 curet the base of the wound is looking very clean and we will change the dressing over to silver alginate Integumentary (Hair, Skin) No suspicious lesions. No crepitus or fluctuance. No peri-wound warmth or erythema. No masses.. Wound #2 status is Open. Original cause of wound was Gradually Appeared. The wound is located on the Right,Distal,Anterior Lower Leg. The wound measures 1.5cm length x 1cm width x 0.2cm depth; 1.178cm^2 area and 0.236cm^3 volume. There is Fat Layer (Subcutaneous Tissue) Exposed exposed. There is no tunneling or undermining noted. There is a large amount of serous drainage noted. The wound margin is distinct with the outline attached to the wound base. There is small (1-33%) red granulation within the wound bed. There is a large (67-100%) amount of necrotic tissue within the wound bed including Adherent Slough. The periwound skin appearance exhibited: Maceration. [place] ([id number]) temperature was noted as No Abnormality. The periwound has tenderness on palpation. Assessment Active Problems ICD-10 L97.212 - Non-pressure chronic ulcer of right calf with fat layer exposed I89.0 - Lymphedema, not elsewhere classified I73.9 - Peripheral vascular disease, unspecified Procedures Wound #2 Pre-procedure diagnosis of Wound #2 is a Venous Leg Ulcer located  on the Right,Distal,Anterior Lower Leg .Severity of Tissue Pre Debridement is: Fat layer exposed. There was a Skin/Subcutaneous Tissue Debridement ([id number]) debridement with total area of 1.5 sq cm performed by Christin Fudge, MD. with the following instrument(s): Curette to remove Viable and Non-Viable tissue/material including Exudate, Fibrin/Slough, and Subcutaneous after achieving pain control using Lidocaine 4% Topical Solution. A time out was conducted at 09:14, prior to the start of the procedure. A Minimum amount of bleeding was controlled with Pressure. The procedure was tolerated well with a pain level of 0 throughout and a pain level of 0 following the procedure. Post Debridement Measurements: 1.5cm length x 1cm width x 0.3cm depth; 0.353cm^3 volume. Character of Wound/Ulcer Post Debridement requires further debridement. Severity of Tissue Post Debridement is: Fat layer exposed. Post procedure Diagnosis Wound #2: Same as Pre-Procedure Plan Wound Cleansing: Wound #2 Right,Distal,Anterior Lower Leg: Clean wound with Normal Saline. Cleanse wound with mild soap and water May Shower, gently pat wound dry prior to applying new dressing. Anesthetic: Wound #2 Right,Distal,Anterior Lower Leg: Topical Lidocaine 4% cream applied to wound bed prior to debridement Primary Wound Dressing: Wound #2 Right,Distal,Anterior Lower Leg: Silvercel Non-Adherent Secondary Dressing: Wound #2 Right,Distal,Anterior Lower Leg: Dry Gauze Other - telfa island, stretch netting #5 Dressing Change Frequency: Wound #2 Right,Distal,Anterior Lower Leg: Change dressing every other day. MACGREGOR, AESCHLIMAN ([id number]) Follow-up Appointments: Wound #2 Right,Distal,Anterior Lower Leg: Return Appointment in 1 week. Edema Control: Wound #2 Right,Distal,Anterior Lower Leg: Elevate legs to the level of the heart and pump ankles as often as possible Support Garment 30-40 mm/Hg pressure to: - pt to purchase and  wear own Additional Orders / Instructions: Wound #2 Right,Distal,Anterior Lower Leg: Increase protein intake. Medications-please add to medication list.: Wound #2 Right,Distal,Anterior Lower Leg: Other: - Vitamin C, Zinc, MVI we have discussed his arterial and venous reflux study and I do not believe this would require any endovenous ablation. he has been advised class I compression stockings and we have discussed this in great detail After sharp debridement and review today, I have recommended: 1. the liver alginate to be changed every other day and to use his compression stockings 30- 40 mm of Hg 2. Elevation and exercise 3. Regular visits to the wound center Electronic Signature(s) Signed: 09/05/2017 9:43:35 AM By: Christin Fudge MD, FACS Entered By: Christin Fudge on 09/05/2017 09:43:35 SALIM, FORERO ([id number]) -------------------------------------------------------------------------------- ROS/PFSH Details Patient Name: [name], [name] Date of Service: 09/05/2017 8:45 AM Medical Record Number: [id number] Patient Account Number: [id number] Date of Birth/Sex: Aug 20, 1946 (71 y.o. Male) Treating RN: Ahmed Prima Primary Care Provider: [name] Other Clinician: Referring Provider: [name] Treating Provider/Extender: Frann Rider in Treatment: 4 Information Obtained From Patient Wound History Do you currently have one or more open woundso Yes How many open wounds do you currently haveo 2 Approximately how long have you had your woundso 3 weeks How have you been treating your wound(s) until nowo vaseline Has your wound(s) ever healed and then re-openedo No Have you had any lab work done in the past montho No Have you tested positive for an antibiotic resistant organism (MRSA, VRE)o No Have you tested positive for osteomyelitis (bone infection)o No Have you had any tests for circulation on your legso Yes Where was the test doneo [facility] Have you had other  problems associated with your woundso Swelling Cardiovascular Medical History: Positive for: Arrhythmia - a-fib; Hypertension Endocrine Medical History: Positive for: Type II Diabetes Treated with: Diet Musculoskeletal Medical History: Positive for: Gout Immunizations Pneumococcal Vaccine: Received Pneumococcal Vaccination: Yes Implantable Devices Family and Social History Cancer: Yes - Father; Diabetes: Yes - Paternal Grandparents; Heart Disease: Yes - Father; Hereditary Spherocytosis: No; Hypertension: Yes - Mother,Father,Siblings; Kidney Disease: No; Lung Disease: No; Seizures: No; Stroke: No; Thyroid Problems: No; Tuberculosis: No; Never smoker; Marital Status - Divorced; Alcohol Use: Rarely; Drug Use: No History; Caffeine Use: Never; Financial Concerns: No; Food, Clothing or Shelter Needs: No; Support System Lacking: No; Transportation Concerns: No; Advanced Directives: No; Patient does not want information on Advanced Directives; Do not resuscitate: No; Living Will: No; Medical Power of Attorney: No Physician Affirmation I have reviewed and agree with the above information. [name], [name] ([id number]) Electronic Signature(s) Signed: 09/05/2017 2:54:02 PM By: Alric Quan Signed: 09/05/2017 4:36:05 PM By: Christin Fudge MD, FACS Entered By: Christin Fudge on 09/05/2017 09:41:48 [name], [name] ([id number]) -------------------------------------------------------------------------------- SuperBill Details Patient Name: [name], [name] Date of Service: 09/05/2017 Medical Record Number: [id number] Patient Account Number: [id number] Date of Birth/Sex: [birth year] (71 y.o. Male) Treating RN: Ahmed Prima Primary Care Provider: [name] Other Clinician: Referring Provider: [name] Treating Provider/Extender: Frann Rider in Treatment: 4 Diagnosis Coding ICD-10 Codes Code Description [phone] Non-pressure chronic ulcer of right calf with fat layer  exposed I89.0 Lymphedema, not elsewhere classified I73.9 Peripheral vascular disease, unspecified Facility Procedures CPT4 Code: [id number] Description: 11042 - DEB SUBQ TISSUE 20 SQ CM/< ICD-10 Diagnosis Description L97.212 Non-pressure chronic ulcer of right calf with fat layer exp I89.0 Lymphedema, not elsewhere classified I73.9 Peripheral vascular disease, unspecified Modifier: osed Quantity: 1 Physician Procedures CPT4 Code: 1093235 Description: 99213 - WC PHYS  LEVEL 3 - EST PT ICD-10 Diagnosis Description L97.212 Non-pressure chronic ulcer of right calf with fat layer exp I89.0 Lymphedema, not elsewhere classified I73.9 Peripheral vascular disease, unspecified Modifier: 25 osed Quantity: 1 CPT4 Code: [id number] Description: 11042 - WC PHYS SUBQ TISS 20 SQ CM ICD-10 Diagnosis Description L97.212 Non-pressure chronic ulcer of right calf with fat layer exp I89.0 Lymphedema, not elsewhere classified I73.9 Peripheral vascular disease, unspecified Modifier: osed Quantity: 1 Electronic Signature(s) Signed: 09/05/2017 9:43:54 AM By: Christin Fudge MD, FACS Entered By: Christin Fudge on 09/05/2017 09:43:54

## 2017-09-12 ENCOUNTER — Encounter: Payer: Medicare Other | Attending: Surgery | Admitting: Surgery

## 2017-09-12 DIAGNOSIS — N400 Enlarged prostate without lower urinary tract symptoms: Secondary | ICD-10-CM | POA: Insufficient documentation

## 2017-09-12 DIAGNOSIS — I10 Essential (primary) hypertension: Secondary | ICD-10-CM | POA: Insufficient documentation

## 2017-09-12 DIAGNOSIS — M109 Gout, unspecified: Secondary | ICD-10-CM | POA: Diagnosis not present

## 2017-09-12 DIAGNOSIS — I890 Lymphedema, not elsewhere classified: Secondary | ICD-10-CM | POA: Insufficient documentation

## 2017-09-12 DIAGNOSIS — I429 Cardiomyopathy, unspecified: Secondary | ICD-10-CM | POA: Diagnosis not present

## 2017-09-12 DIAGNOSIS — E1165 Type 2 diabetes mellitus with hyperglycemia: Secondary | ICD-10-CM | POA: Insufficient documentation

## 2017-09-12 DIAGNOSIS — I739 Peripheral vascular disease, unspecified: Secondary | ICD-10-CM | POA: Insufficient documentation

## 2017-09-12 DIAGNOSIS — I872 Venous insufficiency (chronic) (peripheral): Secondary | ICD-10-CM | POA: Insufficient documentation

## 2017-09-12 DIAGNOSIS — I482 Chronic atrial fibrillation: Secondary | ICD-10-CM | POA: Diagnosis not present

## 2017-09-12 DIAGNOSIS — Z7901 Long term (current) use of anticoagulants: Secondary | ICD-10-CM | POA: Diagnosis not present

## 2017-09-12 DIAGNOSIS — L97212 Non-pressure chronic ulcer of right calf with fat layer exposed: Secondary | ICD-10-CM | POA: Diagnosis not present

## 2017-09-13 NOTE — Progress Notes (Addendum)
XAVIAR, LUNN ([id number]) Visit Report for 09/12/2017 Chief Complaint Document Details Patient Name: [name], [name] Date of Service: 09/12/2017 9:45 AM Medical Record Number: [id number] Patient Account Number: [id number] Date of Birth/Sex: [birth year] (71 y.o. Male) Treating RN: Ahmed Prima Primary Care Provider: [name] Other Clinician: Referring Provider: [name] Treating Provider/Extender: Frann Rider in Treatment: 5 Information Obtained from: Patient Chief Complaint Patient seen for complaints of Non-Healing Wound to the right lower extremity medial calf area which he's had for about 3 weeks Electronic Signature(s) Signed: 09/12/2017 10:16:07 AM By: Christin Fudge MD, FACS Entered By: Christin Fudge on 09/12/2017 10:16:07 [name] ([id number]) -------------------------------------------------------------------------------- HPI Details Patient Name: [name], [name] Date of Service: 09/12/2017 9:45 AM Medical Record Number: [id number] Patient Account Number: [id number] Date of Birth/Sex: 02/15/1946 (71 y.o. Male) Treating RN: Ahmed Prima Primary Care Provider: [name] Other Clinician: Referring Provider: [name] Treating Provider/Extender: Frann Rider in Treatment: 5 History of Present Illness Location: right upper calf medially Quality: Patient reports experiencing a sharp pain to affected area(s). Severity: Patient states wound are getting worse. Duration: Patient has had the wound for < 3 weeks prior to presenting for treatment Timing: Pain in wound is Intermittent (comes and goes Context: The wound would happen gradually Modifying Factors: Other treatment(s) tried include:local care with an antibiotic ointment Associated Signs and Symptoms: Patient reports having increase swelling. 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 the patient has had a vascular workup over 7 years ago and said everything was normal at that stage. He does not have any chronic problems except for cardiac issues which he sees a cardiologist in [place]. 08/15/2017 -- arterial and venous duplex studies still pending. 08/23/2017 -- venous reflux studies done on 08/13/2017 shows venous incompetence throughout the left lower extremity deep system and focally at the left saphenofemoral junction. 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 No evidence of SVT or DVT in bilateral lower extremities The patient has an appointment at the end of the month to get his arterial duplex study done 09/05/2017 -- the patient was seen at the vein and vascular office yesterday by Angelena Form. ABI studies were notable for medial calcification and the toe brachial indices were normal and bilateral ankle-brachial) waveforms were normal with triphasic flow. After review of his venous studies he was not a candidate for laser ablation and his lymphedema was to be treated with compression stockings and lymphedema pump pumps 09/12/2017 -- had a low arterial study done at the [facility] -- unable to obtain reliable ABI is due to medial calcification. Bilateral toe indices were normal with the right being 1.01 and the left being 0.92 and the waveforms were triphasic bilaterally. he did get hold of 30-40 mm compression stockings but is unable to put these on. We will try and get him alternative compression stockings. Electronic Signature(s) Signed: 09/12/2017 10:16:41 AM By: Christin Fudge MD, FACS Previous Signature: 09/12/2017 9:56:23 AM Version By: Christin Fudge MD, FACS Entered By: Christin Fudge on 09/12/2017 10:16:41 DAGO, JUNGWIRTH ([id number]) -------------------------------------------------------------------------------- Physical Exam Details Patient Name: [name], [name] Date of Service: 09/12/2017 9:45 AM Medical Record Number: [id number] Patient Account Number: [id number] Date of Birth/Sex: Mar 07, 1946 (71  y.o. Male) Treating RN: Carolyne Fiscal, Debi Primary Care Provider: [name] Other Clinician: Referring Provider: [name] Treating Provider/Extender: Frann Rider in Treatment: 5 Constitutional . Pulse regular. Respirations normal and unlabored. Afebrile. . Eyes Nonicteric. Reactive to light. Ears, Nose, Mouth, and Throat Lips, teeth, and gums WNL.Marland Kitchen Moist mucosa without lesions. Neck supple and nontender. No palpable supraclavicular or cervical adenopathy. Normal sized without goiter. Respiratory WNL. No retractions.. Cardiovascular Pedal Pulses WNL. No clubbing, cyanosis or edema. Lymphatic No adneopathy. No adenopathy. No adenopathy. Musculoskeletal Adexa without tenderness or enlargement.. Digits and nails w/o clubbing, cyanosis, infection, petechiae, ischemia, or inflammatory conditions.. Integumentary (Hair, Skin) No suspicious lesions. No crepitus or fluctuance. No peri-wound warmth or erythema. No masses.Marland Kitchen Psychiatric Judgement and insight Intact.. No evidence of depression, anxiety, or agitation.. Notes the base of the wound had some debris which was easily washed out with moist saline gauze and there is healthy granulation tissue at the base. Electronic Signature(s) Signed: 09/12/2017 10:17:31 AM By: Christin Fudge MD, FACS Entered By: Christin Fudge on 09/12/2017 10:17:31 EL, PILE ([id number]) -------------------------------------------------------------------------------- Physician Orders Details Patient Name: [name], [name] Date of Service: 09/12/2017 9:45 AM Medical Record  Number: [id number] Patient Account Number: [id number] Date of Birth/Sex: 1946-06-10 (71 y.o. Male) Treating RN: Ahmed Prima Primary Care Provider: [name] Other Clinician: Referring Provider: [name] Treating Provider/Extender: Frann Rider in Treatment: 5 Verbal / Phone Orders: Yes ClinicianCarolyne Fiscal, Debi Read Back and Verified: Yes Diagnosis Coding Wound Cleansing Wound #2 Right,Distal,Anterior Lower Leg o Clean wound with Normal Saline. o Cleanse wound with mild soap and water o May Shower, gently pat wound dry prior to applying new dressing. Anesthetic Wound #2 Right,Distal,Anterior Lower Leg o Topical Lidocaine 4% cream applied to wound bed prior to debridement Primary Wound Dressing Wound #2 Right,Distal,Anterior Lower Leg o Prisma Ag Secondary Dressing Wound #2 Right,Distal,Anterior Lower Leg o Other - telfa island, stretch netting #5 Dressing Change Frequency Wound #2 Right,Distal,Anterior Lower Leg o Change dressing every other day. Follow-up Appointments Wound #2 Right,Distal,Anterior Lower Leg o Return Appointment in 1 week. Edema Control Wound #2 Right,Distal,Anterior Lower Leg o Elevate legs to the level of the heart and pump ankles as often as possible o Support Garment 30-40 mm/Hg pressure to: - pt to purchase and wear own Additional Orders / Instructions Wound #2 Right,Distal,Anterior Lower Leg o Increase protein intake. Medications-please add to medication list. Wound #2 Right,Distal,Anterior Lower Leg o Other: - Vitamin C, Zinc, MVI MUHAMMED, TEUTSCH ([id number]) Electronic Signature(s) Signed: 09/12/2017 4:19:51 PM By: Christin Fudge MD, FACS Signed: 09/12/2017 4:42:16 PM By: Alric Quan Entered By: Alric Quan on 09/12/2017 10:06:07 GEORDAN, XU ([id number]) -------------------------------------------------------------------------------- Problem List Details Patient Name: MIKLO, AKEN. Date of Service: 09/12/2017 9:45 AM Medical Record Number: [id number] Patient Account Number: [id number] Date of Birth/Sex: [birth year] (71 y.o. Male) Treating RN: Ahmed Prima Primary Care Provider: [name] Other Clinician: Referring Provider: [name] Treating Provider/Extender: Frann Rider in Treatment: 5 Active Problems ICD-10 Encounter Code Description Active Date Diagnosis L97.212 Non-pressure chronic ulcer of right calf with fat layer exposed 08/08/2017 Yes I89.0 Lymphedema, not elsewhere classified 08/08/2017 Yes I73.9 Peripheral vascular disease, unspecified 08/08/2017 Yes Inactive Problems Resolved Problems Electronic Signature(s) Signed: 09/12/2017 10:15:51 AM By: Christin Fudge MD, FACS Entered By: Christin Fudge on 09/12/2017 10:15:51 [name] ([id number]) -------------------------------------------------------------------------------- Progress Note Details Patient Name: [name] Date of Service: 09/12/2017 9:45 AM Medical Record Number: [id number] Patient Account Number: [id number] Date of Birth/Sex: 10-Apr-1946 (  71 y.o. Male) Treating RN: Carolyne Fiscal, Debi Primary Care Provider: [name] Other Clinician: Referring Provider: [name] Treating Provider/Extender: Frann Rider in Treatment: 5 Subjective Chief Complaint Information obtained from Patient Patient seen for complaints of Non-Healing Wound to the right lower extremity medial calf area which he's had for about 3 weeks History of Present Illness (HPI) The following HPI elements were documented for the patient's wound: Location: right upper calf medially Quality: Patient reports experiencing a sharp pain to affected area(s). Severity: Patient states wound are getting worse. Duration: Patient has had the wound for < 3 weeks prior to presenting for treatment Timing: Pain in wound is Intermittent (comes and goes Context: The wound would happen  gradually Modifying Factors: Other treatment(s) tried include:local care with an antibiotic ointment Associated Signs and Symptoms: Patient reports having increase swelling. 71 year old male who has a past medical history of essential hypertension, chronic atrial fibrillation, peripheral vascular disease, nonischemic cardiomyopathy,venous stasis dermatitis, gouty arthropathy, basal cell carcinoma of the right lower extremity, benign prostatic hypertrophy, long-term use of anticoagulation therapy, hyperglycemia and exercise intolerance has never been a smoker. the patient has had a vascular workup over 7 years ago and said everything was normal at that stage. He does not have any chronic problems except for cardiac issues which he sees a cardiologist in [place]. 08/15/2017 -- arterial and venous duplex studies still pending. 08/23/2017 -- venous reflux studies done on 08/13/2017 shows venous incompetence throughout the left lower extremity deep system and focally at the left saphenofemoral junction. 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 No evidence of SVT or DVT in bilateral lower extremities The patient has an appointment at the end of the month to get his arterial duplex study done 09/05/2017 -- the patient was seen at the vein and vascular office yesterday by Angelena Form. ABI studies were notable for medial calcification and the toe brachial indices were normal and bilateral ankle-brachial) waveforms were normal with triphasic flow. After review of his venous studies he was not a candidate for laser ablation and his lymphedema was to be treated with compression stockings and lymphedema pump pumps 09/12/2017 -- had a low arterial study done at the [facility] -- unable to obtain reliable ABI is due to medial calcification. Bilateral toe indices were normal with the right being 1.01 and the left being 0.92 and the waveforms were triphasic  bilaterally. he did get hold of 30-40 mm compression stockings but is unable to put these on. We will try and get him alternative compression stockings. Patient History Information obtained from Patient. RAMAN, FEATHERSTON ([id number]) Family History Cancer - Father, Diabetes - Paternal Grandparents, Heart Disease - Father, Hypertension - Mother,Father,Siblings, No family history of Hereditary Spherocytosis, Kidney Disease, Lung Disease, Seizures, Stroke, Thyroid Problems, Tuberculosis. Social History Never smoker, Marital Status - Divorced, Alcohol Use - Rarely, Drug Use - No History, Caffeine Use - Never. Objective Constitutional Pulse regular. Respirations normal and unlabored. Afebrile. Vitals Time Taken: 9:50 AM, Height: 73 in, Weight: 243.3 lbs, BMI: 32.1, Temperature: 97.7 F, Pulse: 71 bpm, Respiratory Rate: 20 breaths/min, Blood Pressure: 143/70 mmHg. Eyes Nonicteric. Reactive to light. Ears, Nose, Mouth, and Throat Lips, teeth, and gums WNL.Marland Kitchen Moist mucosa without lesions. Neck supple and nontender. No palpable supraclavicular or cervical adenopathy. Normal sized without goiter. Respiratory WNL. No retractions.. Cardiovascular Pedal Pulses WNL. No clubbing, cyanosis or edema. Lymphatic No adneopathy. No adenopathy. No adenopathy. Musculoskeletal Adexa without tenderness or enlargement.. Digits and nails  w/o clubbing, cyanosis, infection, petechiae, ischemia, or inflammatory conditions.Marland Kitchen Psychiatric Judgement and insight Intact.. No evidence of depression, anxiety, or agitation.. General Notes: the base of the wound had some debris which was easily washed out with moist saline gauze and there is healthy granulation tissue at the base. Integumentary (Hair, Skin) No suspicious lesions. No crepitus or fluctuance. No peri-wound warmth or erythema. No masses.Marland Kitchen ORDELL, PRICHETT R. ([id number]) Wound #2 status is Open. Original cause of wound was Gradually Appeared. The wound is  located on the Right,Distal,Anterior Lower Leg. The wound measures 1.5cm length x 1cm width x 0.2cm depth; 1.178cm^2 area and 0.236cm^3 volume. There is Fat Layer (Subcutaneous Tissue) Exposed exposed. There is no tunneling or undermining noted. There is a large amount of serous drainage noted. The wound margin is distinct with the outline attached to the wound base. There is no granulation within the wound bed. There is a large (67-100%) amount of necrotic tissue within the wound bed including Adherent Slough. The periwound skin appearance exhibited: Erythema. The periwound skin appearance did not exhibit: Maceration. The surrounding wound skin color is noted with erythema which is circumferential. Periwound temperature was noted as No Abnormality. The periwound has tenderness on palpation. Assessment Active Problems ICD-10 L97.212 - Non-pressure chronic ulcer of right calf with fat layer exposed I89.0 - Lymphedema, not elsewhere classified I73.9 - Peripheral vascular disease, unspecified Plan Wound Cleansing: Wound #2 Right,Distal,Anterior Lower Leg: Clean wound with Normal Saline. Cleanse wound with mild soap and water May Shower, gently pat wound dry prior to applying new dressing. Anesthetic: Wound #2 Right,Distal,Anterior Lower Leg: Topical Lidocaine 4% cream applied to wound bed prior to debridement Primary Wound Dressing: Wound #2 Right,Distal,Anterior Lower Leg: Prisma Ag Secondary Dressing: Wound #2 Right,Distal,Anterior Lower Leg: Other - telfa island, stretch netting #5 Dressing Change Frequency: Wound #2 Right,Distal,Anterior Lower Leg: Change dressing every other day. Follow-up Appointments: Wound #2 Right,Distal,Anterior Lower Leg: Return Appointment in 1 week. Edema Control: Wound #2 Right,Distal,Anterior Lower Leg: Elevate legs to the level of the heart and pump ankles as often as possible Support Garment 30-40 mm/Hg pressure to: - pt to purchase and wear  own Additional Orders / Instructions: Wound #2 Right,Distal,Anterior Lower Leg: Increase protein intake. Medications-please add to medication list.: Wound #2 Right,Distal,Anterior Lower Leg: Other: - Vitamin C, Zinc, MVI Lamagna, Javyon R. ([id number]) we have discussed compression stockings, elevation and exercise and all the necessary cons of operative therapy with his class I compression. After review today, I have recommended: 1. Prisma AG to be changed every other day and to use his compression stockings 20 - 30 mm of Hg 2. we will order him to layer compression stockings and juxta lites through his insurance company. 3. Elevation and exercise 4. Regular visits to the wound center Electronic Signature(s) Signed: 09/12/2017 10:19:18 AM By: Christin Fudge MD, FACS Entered By: Christin Fudge on 09/12/2017 10:19:18 COURVOISIER, HAMBLEN ([id number]) -------------------------------------------------------------------------------- ROS/PFSH Details Patient Name: KYO, COCUZZA R. Date of Service: 09/12/2017 9:45 AM Medical Record Number: [id number] Patient Account Number: [id number] Date of Birth/Sex: 1946-03-12 (71 y.o. Male) Treating RN: Ahmed Prima Primary Care Provider: [name] Other Clinician: Referring Provider: [name] Treating Provider/Extender: Frann Rider in Treatment: 5 Information Obtained From Patient Wound History Do you currently have one or more open woundso Yes How many open wounds do you currently haveo 2 Approximately how long have you had your woundso 3 weeks How have you been treating your wound(s) until nowo vaseline Has your wound(s) ever healed  and then re-openedo No Have you had any lab work done in the past montho No Have you tested positive for an antibiotic resistant organism (MRSA, VRE)o No Have you tested positive for osteomyelitis (bone infection)o No Have you had any tests for circulation on your legso Yes Where was the test doneo  [facility] Have you had other problems associated with your woundso Swelling Cardiovascular Medical History: Positive for: Arrhythmia - a-fib; Hypertension Endocrine Medical History: Positive for: Type II Diabetes Treated with: Diet Musculoskeletal Medical History: Positive for: Gout Immunizations Pneumococcal Vaccine: Received Pneumococcal Vaccination: Yes Implantable Devices Family and Social History Cancer: Yes - Father; Diabetes: Yes - Paternal Grandparents; Heart Disease: Yes - Father; Hereditary Spherocytosis: No; Hypertension: Yes - Mother,Father,Siblings; Kidney Disease: No; Lung Disease: No; Seizures: No; Stroke: No; Thyroid Problems: No; Tuberculosis: No; Never smoker; Marital Status - Divorced; Alcohol Use: Rarely; Drug Use: No History; Caffeine Use: Never; Financial Concerns: No; Food, Clothing or Shelter Needs: No; Support System Lacking: No; Transportation Concerns: No; Advanced Directives: No; Patient does not want information on Advanced Directives; Do not resuscitate: No; Living Will: No; Medical Power of Attorney: No Physician Affirmation I have reviewed and agree with the above information. OUSMAN, DISE ([id number]) Electronic Signature(s) Signed: 09/12/2017 4:19:51 PM By: Christin Fudge MD, FACS Signed: 09/12/2017 4:42:16 PM By: Alric Quan Entered By: Christin Fudge on 09/12/2017 10:16:49 KAIZER, DISSINGER ([id number]) -------------------------------------------------------------------------------- SuperBill Details Patient Name: QUINNLAN, ABRUZZO. Date of Service: 09/12/2017 Medical Record Number: [id number] Patient Account Number: [id number] Date of Birth/Sex: 1945-12-11 (71 y.o. Male) Treating RN: Ahmed Prima Primary Care Provider: [name] Other Clinician: Referring Provider: [name] Treating Provider/Extender: Frann Rider in Treatment: 5 Diagnosis Coding ICD-10 Codes Code Description [phone] Non-pressure chronic ulcer of right  calf with fat layer exposed I89.0 Lymphedema, not elsewhere classified I73.9 Peripheral vascular disease, unspecified Facility Procedures CPT4 Code: [id number] Description: 99213 - WOUND CARE VISIT-LEV 3 EST PT Modifier: Quantity: 1 Physician Procedures CPT4 Code: [id number] Description: 27062 - WC PHYS LEVEL 3 - EST PT ICD-10 Diagnosis Description L97.212 Non-pressure chronic ulcer of right calf with fat layer exp I89.0 Lymphedema, not elsewhere classified I73.9 Peripheral vascular disease, unspecified Modifier: osed Quantity: 1 Electronic Signature(s) Signed: 09/12/2017 4:36:43 PM By: Alric Quan Signed: 09/13/2017 4:35:05 PM By: Christin Fudge MD, FACS Previous Signature: 09/12/2017 10:19:33 AM Version By: Christin Fudge MD, FACS Entered By: Alric Quan on 09/12/2017 16:36:43

## 2017-09-18 ENCOUNTER — Other Ambulatory Visit: Payer: Medicare Other

## 2017-09-18 ENCOUNTER — Other Ambulatory Visit: Payer: Self-pay

## 2017-09-18 DIAGNOSIS — N401 Enlarged prostate with lower urinary tract symptoms: Secondary | ICD-10-CM

## 2017-09-19 ENCOUNTER — Ambulatory Visit: Payer: Medicare Other | Admitting: Surgery

## 2017-09-19 LAB — PSA: PROSTATE SPECIFIC AG, SERUM: 0.6 ng/mL (ref 0.0–4.0)

## 2017-09-20 NOTE — Progress Notes (Signed)
Handouts: Other: change dressing as ordered Methods: Demonstration, Explain/Verbal Responses: State content correctly Electronic  Signature(s) Signed: 09/12/2017 4:42:16 PM By: Alric Quan Entered By: Alric Quan on 09/12/2017 10:03:12 [name] ([id number]) -------------------------------------------------------------------------------- Wound Assessment Details Patient Name: [name], [name] Date of Service: 09/12/2017 9:45 AM Medical Record Number: [id number] Patient Account Number: [id number] Date of Birth/Sex: 05/03/1946 (71 y.o. Male) Treating RN: Ahmed Prima Primary Care Willia Genrich: [name] Other Clinician: Referring Maki Hege: [name] Treating Fern Asmar/Extender: Frann Rider in Treatment: 5 Wound Status Wound Number: 2 Primary Venous Leg Ulcer Etiology: Wound Location: Right Lower Leg - Anterior, Distal Wound Status: Open Wounding Event: Gradually Appeared Comorbid Arrhythmia, Hypertension, Type II Diabetes, Date Acquired: 07/18/2017 History: Gout Weeks Of Treatment: 5 Clustered Wound: No Photos Photo Uploaded By: Alric Quan on 09/12/2017 16:32:23 Wound Measurements Length: (cm) 1.5 Width: (cm) 1 Depth: (cm) 0.2 Area: (cm) 1.178 Volume: (cm) 0.236 % Reduction in Area: 58.7% % Reduction in Volume: 17.2% Epithelialization: None Tunneling: No Undermining: No Wound Description Classification: Partial Thickness Wound Margin: Distinct, outline attached Exudate Amount: Large Exudate Type: Serous Exudate Color: amber Foul Odor After Cleansing: No Slough/Fibrino Yes Wound Bed Granulation Amount: None Present (0%) Exposed Structure Necrotic Amount: Large (67-100%) Fascia Exposed: No Necrotic Quality: Adherent Slough Fat Layer (Subcutaneous Tissue) Exposed: Yes Tendon Exposed: No Muscle Exposed: No Joint Exposed: No Bone Exposed: No Periwound Skin Texture GEDDY, BOYDSTUN [name] ([id number]) Texture Color No Abnormalities Noted: No No Abnormalities Noted: No Erythema: Yes Moisture Erythema Location: Circumferential No Abnormalities Noted:  No Maceration: No Temperature / Pain Temperature: No Abnormality Tenderness on Palpation: Yes Wound Preparation Ulcer Cleansing: Rinsed/Irrigated with Saline Topical Anesthetic Applied: Other: lidocaine 4%, Treatment Notes Wound #2 (Right, Distal, Anterior Lower Leg) 1. Cleansed with: Clean wound with Normal Saline 2. Anesthetic Topical Lidocaine 4% cream to wound bed prior to debridement 3. Peri-wound Care: Skin Prep 4. Dressing Applied: Prisma Ag 5. Secondary [place] Notes stretch netting #5 Electronic Signature(s) Signed: 09/12/2017 4:42:16 PM By: Alric Quan Entered By: Alric Quan on 09/12/2017 09:54:03 Lanter, Marlou Starks ([id number]) -------------------------------------------------------------------------------- [place] Details Patient Name: [name], [name] Date of Service: 09/12/2017 9:45 AM Medical Record Number: [id number] Patient Account Number: [id number] Date of Birth/Sex: 03/31/1946 (71 y.o. Male) Treating RN: Ahmed Prima Primary Care Nelwyn Hebdon: [name] Other Clinician: Referring Loula Marcella: [name] Treating Cynthea Zachman/Extender: Frann Rider in Treatment: 5 Vital Signs Time Taken: 09:50 Temperature (F): 97.7 Height (in): 73 Pulse (bpm): 71 Weight (lbs): 243.3 Respiratory Rate (breaths/min): 20 Body Mass Index (BMI): 32.1 Blood Pressure (mmHg): 143/70 Reference Range: 80 - 120 mg / dl Electronic Signature(s) Signed: 09/12/2017 4:42:16 PM By: Alric Quan Entered By: Alric Quan on 09/12/2017 09:52:22  VIRAAJ, VORNDRAN ([id number]) Visit Report for 09/12/2017 Arrival Information Details Patient Name: [name] Date of Service: 09/12/2017 9:45 AM Medical Record Number: [id number] Patient Account Number: [id number] Date of Birth/Sex: [birth year] (71 y.o. Male) Treating RN: Ahmed Prima Primary Care Carolyne Whitsel: [name] Other Clinician: Referring Josey Forcier: [name] Treating Joshwa Hemric/Extender: Frann Rider in Treatment: 5 Visit Information History Since Last Visit All ordered tests and consults were completed: No Patient Arrived: Ambulatory Added or deleted any medications: No Arrival Time: 09:48 Any new allergies or adverse reactions: No Accompanied By: self Had a fall or experienced change in No Transfer Assistance: None activities of daily living that may affect Patient Identification Verified: Yes risk of falls: Secondary Verification Process Yes Signs or symptoms of abuse/neglect since last visito No Completed: Hospitalized since last visit: No Patient Requires Transmission-Based No Has Dressing in Place as Prescribed: Yes Precautions: Pain Present Now: No Patient Has Alerts: Yes Patient Alerts: Patient on Blood Thinner Xarelto [name] ABI non- compressible L ABI non- compressible Electronic Signature(s) Signed: 09/12/2017 4:42:16 PM By: Alric Quan Entered By: Alric Quan on 09/12/2017 09:50:11 [name] ([id number]) -------------------------------------------------------------------------------- Clinic Level of Care Assessment Details Patient Name: [name] [name] Date of Service: 09/12/2017 9:45 AM Medical Record Number: [id number] Patient Account Number: [id number] Date of Birth/Sex: Mar 19, 1946 (71 y.o. Male) Treating RN: Ahmed Prima Primary Care Kasai Beltran: [name] Other Clinician: Referring Tarica Harl: [name] Treating Amylee Lodato/Extender: Frann Rider in Treatment: 5 Clinic Level of Care Assessment  Items TOOL 4 Quantity Score X - Use when only an EandM is performed on FOLLOW-UP visit 1 0 ASSESSMENTS - Nursing Assessment / Reassessment X - Reassessment of Co-morbidities (includes updates in patient status) 1 10 X- 1 5 Reassessment of Adherence to Treatment Plan ASSESSMENTS - Wound and Skin Assessment / Reassessment X - Simple Wound Assessment / Reassessment - one wound 1 5 []  - 0 Complex Wound Assessment / Reassessment - multiple wounds []  - 0 Dermatologic / Skin Assessment (not related to wound area) ASSESSMENTS - Focused Assessment []  - Circumferential Edema Measurements - multi extremities 0 []  - 0 Nutritional Assessment / Counseling / Intervention []  - 0 Lower Extremity Assessment (monofilament, tuning fork, pulses) []  - 0 Peripheral Arterial Disease Assessment (using hand held doppler) ASSESSMENTS - Ostomy and/or Continence Assessment and Care []  - Incontinence Assessment and Management 0 []  - 0 Ostomy Care Assessment and Management (repouching, etc.) PROCESS - Coordination of Care X - Simple Patient / Family Education for ongoing care 1 15 []  - 0 Complex (extensive) Patient / Family Education for ongoing care []  - 0 Staff obtains Programmer, systems, Records, Test Results / Process Orders []  - 0 Staff telephones HHA, Nursing Homes / Clarify orders / etc []  - 0 Routine Transfer to another Facility (non-emergent condition) []  - 0 Routine Hospital Admission (non-emergent condition) []  - 0 New Admissions / Biomedical engineer / Ordering NPWT, Apligraf, etc. []  - 0 Emergency Hospital Admission (emergent condition) X- 1 10 Simple Discharge Coordination ROMELO, SCIANDRA. ([id number]) []  - 0 Complex (extensive) Discharge Coordination PROCESS - Special Needs []  - Pediatric / Minor Patient Management 0 []  - 0 Isolation Patient Management []  - 0 Hearing / Language / Visual special needs []  - 0 Assessment of Community assistance (transportation, [name], etc.) []  -  0 Additional assistance / Altered mentation []  - 0 Support Surface(s) Assessment (bed, cushion, seat, etc.) INTERVENTIONS - Wound Cleansing / Measurement X - Simple Wound Cleansing - one wound 1 5 []  -  Handouts: Other: change dressing as ordered Methods: Demonstration, Explain/Verbal Responses: State content correctly Electronic  Signature(s) Signed: 09/12/2017 4:42:16 PM By: Alric Quan Entered By: Alric Quan on 09/12/2017 10:03:12 [name] ([id number]) -------------------------------------------------------------------------------- Wound Assessment Details Patient Name: [name], [name] Date of Service: 09/12/2017 9:45 AM Medical Record Number: [id number] Patient Account Number: [id number] Date of Birth/Sex: 05/03/1946 (71 y.o. Male) Treating RN: Ahmed Prima Primary Care Willia Genrich: [name] Other Clinician: Referring Maki Hege: [name] Treating Fern Asmar/Extender: Frann Rider in Treatment: 5 Wound Status Wound Number: 2 Primary Venous Leg Ulcer Etiology: Wound Location: Right Lower Leg - Anterior, Distal Wound Status: Open Wounding Event: Gradually Appeared Comorbid Arrhythmia, Hypertension, Type II Diabetes, Date Acquired: 07/18/2017 History: Gout Weeks Of Treatment: 5 Clustered Wound: No Photos Photo Uploaded By: Alric Quan on 09/12/2017 16:32:23 Wound Measurements Length: (cm) 1.5 Width: (cm) 1 Depth: (cm) 0.2 Area: (cm) 1.178 Volume: (cm) 0.236 % Reduction in Area: 58.7% % Reduction in Volume: 17.2% Epithelialization: None Tunneling: No Undermining: No Wound Description Classification: Partial Thickness Wound Margin: Distinct, outline attached Exudate Amount: Large Exudate Type: Serous Exudate Color: amber Foul Odor After Cleansing: No Slough/Fibrino Yes Wound Bed Granulation Amount: None Present (0%) Exposed Structure Necrotic Amount: Large (67-100%) Fascia Exposed: No Necrotic Quality: Adherent Slough Fat Layer (Subcutaneous Tissue) Exposed: Yes Tendon Exposed: No Muscle Exposed: No Joint Exposed: No Bone Exposed: No Periwound Skin Texture GEDDY, BOYDSTUN [name] ([id number]) Texture Color No Abnormalities Noted: No No Abnormalities Noted: No Erythema: Yes Moisture Erythema Location: Circumferential No Abnormalities Noted:  No Maceration: No Temperature / Pain Temperature: No Abnormality Tenderness on Palpation: Yes Wound Preparation Ulcer Cleansing: Rinsed/Irrigated with Saline Topical Anesthetic Applied: Other: lidocaine 4%, Treatment Notes Wound #2 (Right, Distal, Anterior Lower Leg) 1. Cleansed with: Clean wound with Normal Saline 2. Anesthetic Topical Lidocaine 4% cream to wound bed prior to debridement 3. Peri-wound Care: Skin Prep 4. Dressing Applied: Prisma Ag 5. Secondary [place] Notes stretch netting #5 Electronic Signature(s) Signed: 09/12/2017 4:42:16 PM By: Alric Quan Entered By: Alric Quan on 09/12/2017 09:54:03 Lanter, Marlou Starks ([id number]) -------------------------------------------------------------------------------- [place] Details Patient Name: [name], [name] Date of Service: 09/12/2017 9:45 AM Medical Record Number: [id number] Patient Account Number: [id number] Date of Birth/Sex: 03/31/1946 (71 y.o. Male) Treating RN: Ahmed Prima Primary Care Nelwyn Hebdon: [name] Other Clinician: Referring Loula Marcella: [name] Treating Cynthea Zachman/Extender: Frann Rider in Treatment: 5 Vital Signs Time Taken: 09:50 Temperature (F): 97.7 Height (in): 73 Pulse (bpm): 71 Weight (lbs): 243.3 Respiratory Rate (breaths/min): 20 Body Mass Index (BMI): 32.1 Blood Pressure (mmHg): 143/70 Reference Range: 80 - 120 mg / dl Electronic Signature(s) Signed: 09/12/2017 4:42:16 PM By: Alric Quan Entered By: Alric Quan on 09/12/2017 09:52:22  Handouts: Other: change dressing as ordered Methods: Demonstration, Explain/Verbal Responses: State content correctly Electronic  Signature(s) Signed: 09/12/2017 4:42:16 PM By: Alric Quan Entered By: Alric Quan on 09/12/2017 10:03:12 [name] ([id number]) -------------------------------------------------------------------------------- Wound Assessment Details Patient Name: [name], [name] Date of Service: 09/12/2017 9:45 AM Medical Record Number: [id number] Patient Account Number: [id number] Date of Birth/Sex: 05/03/1946 (71 y.o. Male) Treating RN: Ahmed Prima Primary Care Willia Genrich: [name] Other Clinician: Referring Maki Hege: [name] Treating Fern Asmar/Extender: Frann Rider in Treatment: 5 Wound Status Wound Number: 2 Primary Venous Leg Ulcer Etiology: Wound Location: Right Lower Leg - Anterior, Distal Wound Status: Open Wounding Event: Gradually Appeared Comorbid Arrhythmia, Hypertension, Type II Diabetes, Date Acquired: 07/18/2017 History: Gout Weeks Of Treatment: 5 Clustered Wound: No Photos Photo Uploaded By: Alric Quan on 09/12/2017 16:32:23 Wound Measurements Length: (cm) 1.5 Width: (cm) 1 Depth: (cm) 0.2 Area: (cm) 1.178 Volume: (cm) 0.236 % Reduction in Area: 58.7% % Reduction in Volume: 17.2% Epithelialization: None Tunneling: No Undermining: No Wound Description Classification: Partial Thickness Wound Margin: Distinct, outline attached Exudate Amount: Large Exudate Type: Serous Exudate Color: amber Foul Odor After Cleansing: No Slough/Fibrino Yes Wound Bed Granulation Amount: None Present (0%) Exposed Structure Necrotic Amount: Large (67-100%) Fascia Exposed: No Necrotic Quality: Adherent Slough Fat Layer (Subcutaneous Tissue) Exposed: Yes Tendon Exposed: No Muscle Exposed: No Joint Exposed: No Bone Exposed: No Periwound Skin Texture GEDDY, BOYDSTUN [name] ([id number]) Texture Color No Abnormalities Noted: No No Abnormalities Noted: No Erythema: Yes Moisture Erythema Location: Circumferential No Abnormalities Noted:  No Maceration: No Temperature / Pain Temperature: No Abnormality Tenderness on Palpation: Yes Wound Preparation Ulcer Cleansing: Rinsed/Irrigated with Saline Topical Anesthetic Applied: Other: lidocaine 4%, Treatment Notes Wound #2 (Right, Distal, Anterior Lower Leg) 1. Cleansed with: Clean wound with Normal Saline 2. Anesthetic Topical Lidocaine 4% cream to wound bed prior to debridement 3. Peri-wound Care: Skin Prep 4. Dressing Applied: Prisma Ag 5. Secondary [place] Notes stretch netting #5 Electronic Signature(s) Signed: 09/12/2017 4:42:16 PM By: Alric Quan Entered By: Alric Quan on 09/12/2017 09:54:03 Lanter, Marlou Starks ([id number]) -------------------------------------------------------------------------------- [place] Details Patient Name: [name], [name] Date of Service: 09/12/2017 9:45 AM Medical Record Number: [id number] Patient Account Number: [id number] Date of Birth/Sex: 03/31/1946 (71 y.o. Male) Treating RN: Ahmed Prima Primary Care Nelwyn Hebdon: [name] Other Clinician: Referring Loula Marcella: [name] Treating Cynthea Zachman/Extender: Frann Rider in Treatment: 5 Vital Signs Time Taken: 09:50 Temperature (F): 97.7 Height (in): 73 Pulse (bpm): 71 Weight (lbs): 243.3 Respiratory Rate (breaths/min): 20 Body Mass Index (BMI): 32.1 Blood Pressure (mmHg): 143/70 Reference Range: 80 - 120 mg / dl Electronic Signature(s) Signed: 09/12/2017 4:42:16 PM By: Alric Quan Entered By: Alric Quan on 09/12/2017 09:52:22

## 2017-09-23 NOTE — Progress Notes (Signed)
09/25/2017 10:50 AM   [name] 1946/08/10 [id number]  Referring provider: Birdie Sons, [place] [place], [place] [address]  Chief Complaint  Patient presents with  . Benign Prostatic Hypertrophy         HPI: Patient is a 71 year old Caucasian male with a history of erectile dysfunction (s/p penile prothesis placement on 12/21/2013) and BPH with LUTS who presents for his yearly appointment.    Erectile dysfunction Penile prothesis in place and functioning properly.    BPH WITH LUTS His IPSS score today is 4, which is mild lower urinary tract symptomatology. He is mostly satisfied with his quality life due to his urinary symptoms.   His previous I PSS score was 8/2.  His major complaints today are frequency, intermittency and nocturia x 2.  He has had these symptoms for several years.  He does not find them bothersome.  He denies any dysuria, hematuria or suprapubic pain.  He also denies any recent fevers, chills, nausea or vomiting.  His father was diagnosed with prostate cancer.  IPSS    Row Name 09/25/17 1000         International Prostate Symptom Score   How often have you had the sensation of not emptying your bladder?  Less than 1 in 5     How often have you had to urinate less than every two hours?  Not at All     How often have you found you stopped and started again several times when you urinated?  Less than 1 in 5 times     How often have you found it difficult to postpone urination?  Not at All     How often have you had a weak urinary stream?  Not at All     How often have you had to strain to start urination?  Not at All     How many times did you typically get up at night to urinate?  2 Times     Total IPSS Score  4       Quality of Life due to urinary symptoms   If you were to spend the rest of your life with your urinary condition just the way it is now how would you feel about that?  Mostly Satisfied        Score:  1-7  Mild 8-19 Moderate 20-35 Severe    PMH: Past Medical History:  Diagnosis Date  . BPH with obstruction/lower urinary tract symptoms   . Chronic atrial fibrillation ([place])   . Dislocation of shoulder, anterior, right, closed 07/24/2014  . Echocardiogram abnormal    2009 moderate to severely dilated left atrium  . Erectile dysfunction   . Essential hypertension   . Gout   . History of chicken pox   . History of measles   . Hyperglycemia   . Hypogonadism in male   . Nocturia   . Peyronie's disease   . Traumatic tear of right rotator cuff 07/24/2014  . Venous stasis    chronic    Surgical History: Past Surgical History:  Procedure Laterality Date  . CARDIAC CATHETERIZATION  03/28/2004   normal coronaries, reduced EF at 25-30% (Dr. Gerrie Nordmann)  . CARDIOVERSION, TEE guided  03/29/2004   Dr. Gerrie Nordmann   . COLONOSCOPY  05/28/2013   Dr. Rayann Heman  . HERNIA REPAIR  age 36  . KNEE SURGERY    . LUMBAR FUSION  age 82  . MYOVIEW  CARDIOVASCULAR STRESS TEST  07/2003   anterior wall thinning, LV systolic function depressed at 33%  . PILONIDAL CYST EXCISION    . Right Eyebrow Tumor Removed  age 42   . SHOULDER ARTHROSCOPY WITH ROTATOR CUFF REPAIR AND SUBACROMIAL DECOMPRESSION Right 08/19/2014   Procedure: SHOULDER ARTHROSCOPY WITH ROTATOR CUFF REPAIR AND SUBACROMIAL DECOMPRESSION;  Surgeon: Lorn Junes, MD;  Location: [facility];  Service: Orthopedics;  Laterality: Right;  . TOTAL HIP ARTHROPLASTY Right 03/25/2006   Dr. Wanda Plump. [place]  . TRANSTHORACIC ECHOCARDIOGRAM  08/2008   EF=>55%, mild-mod dilated; LA mod-severely dilated; RA mild-mod dilated; mild calcif of MV, borderline MVP, trace MR; trace TR; mild pulm valve regurg     Home Medications:  Allergies as of 09/25/2017   No Known Allergies     Medication List        Accurate as of 09/25/17 10:50 AM. Always use your most recent med list.          benazepril 20 MG tablet Commonly known as:  LOTENSIN take 1  tablet by mouth twice a day   carvedilol 12.5 MG tablet Commonly known as:  COREG take 1 tablet by mouth twice a day   chlorthalidone 25 MG tablet Commonly known as:  HYGROTON Take 1 tablet (25 mg total) by mouth daily.   multivitamin tablet Take 1 tablet by mouth daily.   XARELTO 20 MG Tabs tablet Generic drug:  rivaroxaban take 1 tablet by mouth once daily WITH SUPPER       Allergies: No Known Allergies  Family History: Family History  Problem Relation Age of Onset  . Hypertension Father   . Prostate cancer Father   . Bladder Cancer Father   . Hypertension Mother   . Stroke Maternal Grandfather   . Diabetes Paternal Grandfather   . Supraventricular tachycardia Brother   . Arthritis Sister        RA  . Diabetes Maternal Uncle        TYPE 2  . Supraventricular tachycardia Child   . Kidney disease Neg Hx   . Heart attack Neg Hx   . Colon cancer Neg Hx     Social History:  reports that  has never smoked. he has never used smokeless tobacco. He reports that he drinks alcohol. He reports that he does not use drugs.  ROS: UROLOGY Frequent Urination?: No Hard to postpone urination?: No Burning/pain with urination?: No Get up at night to urinate?: No Leakage of urine?: No Urine stream starts and stops?: No Trouble starting stream?: No Do you have to strain to urinate?: No Blood in urine?: No Urinary tract infection?: No Sexually transmitted disease?: No Injury to kidneys or bladder?: No Painful intercourse?: No Weak stream?: No Erection problems?: No Penile pain?: No  Gastrointestinal Nausea?: No Vomiting?: No Indigestion/heartburn?: No Diarrhea?: No Constipation?: No  Constitutional Fever: No Night sweats?: No Weight loss?: No Fatigue?: No  Skin Skin rash/lesions?: No Itching?: No  Eyes Blurred vision?: No Double vision?: No  Ears/Nose/Throat Sore throat?: No Sinus problems?: No  Hematologic/Lymphatic Swollen glands?: No Easy  bruising?: No  Cardiovascular Leg swelling?: No Chest pain?: No  Respiratory Cough?: No Shortness of breath?: No  Endocrine Excessive thirst?: No  Musculoskeletal Back pain?: No Joint pain?: No  Neurological Headaches?: No Dizziness?: No  Psychologic Depression?: No Anxiety?: No  Physical Exam: BP 135/84   Pulse 82   Ht 6\' 3"  (1.905 m)   Wt 239 lb (108.4 kg)   BMI  29.87 kg/m   Constitutional: Well nourished. Alert and oriented, No acute distress. HEENT: [place] AT, moist mucus membranes. Trachea midline, no masses. Cardiovascular: No clubbing, cyanosis, or edema. Respiratory: Normal respiratory effort, no increased work of breathing. GI: Abdomen is soft, non tender, non distended, no abdominal masses. Liver and spleen not palpable.  No hernias appreciated.  Stool sample for occult testing is not indicated.   GU: No CVA tenderness.  No bladder fullness or masses.  Patient with circumcised phallus.  Urethral meatus is patent.  No penile discharge. No penile lesions or rashes. Cylinders are located in the penis.  Scrotum without lesions, cysts, rashes and/or edema.  Testicles are located scrotally bilaterally. No masses are appreciated in the testicles. Left and right epididymis are normal.  Pump in place.  Rectal: Patient with  normal sphincter tone. Anus and perineum without scarring or rashes. No rectal masses are appreciated. Prostate is approximately 45 grams, no nodules are appreciated. Seminal vesicles are normal. Skin: No rashes, bruises or suspicious lesions. Lymph: No cervical or inguinal adenopathy. Neurologic: Grossly intact, no focal deficits, moving all 4 extremities. Psychiatric: Normal mood and affect.  Laboratory Data: Lab Results  Component Value Date   WBC 6.3 04/11/2017   HGB 15.3 04/11/2017   HCT 43.5 04/11/2017   MCV 96 04/11/2017   PLT 292 04/11/2017    Lab Results  Component Value Date   CREATININE 1.12 04/11/2017   PSA History  0.7 ng/mL  on 09/21/2013  0.5 ng/mL on 02/11/2014  0.7 ng/mL on 09/24/2014  0.6 ng/mL on 09/23/2015  0.7 ng/mL on 09/20/2016  0.6 ng/mL on 09/18/2017  I have reviewed the labs.  Assessment & Plan:    1. Erectile dysfunction:   Patient is s/p penile prosthesis placement on 12/21/2013.  I His questions are answered.  He is reassured that the prosthesis functioning properly.    2. BPH with LUTS  - IPSS score is 4/2, it is improving  - Continue conservative management, avoiding bladder irritants and timed voiding's  - RTC in 12 months for IPSS, PSA and exam    Return in about 1 year (around 09/25/2018) for IPSS, PSA and exam.  Zara Council, [facility]  [place] [address] [place], [place] [address] [phone]

## 2017-09-25 ENCOUNTER — Encounter: Payer: Self-pay | Admitting: Urology

## 2017-09-25 ENCOUNTER — Other Ambulatory Visit: Payer: Self-pay

## 2017-09-25 ENCOUNTER — Ambulatory Visit: Payer: Medicare Other | Admitting: Urology

## 2017-09-25 VITALS — BP 135/84 | HR 82 | Ht 75.0 in | Wt 239.0 lb

## 2017-09-25 DIAGNOSIS — N138 Other obstructive and reflux uropathy: Secondary | ICD-10-CM

## 2017-09-25 DIAGNOSIS — N529 Male erectile dysfunction, unspecified: Secondary | ICD-10-CM

## 2017-09-25 DIAGNOSIS — N401 Enlarged prostate with lower urinary tract symptoms: Secondary | ICD-10-CM

## 2017-09-26 ENCOUNTER — Encounter: Payer: Medicare Other | Admitting: Nurse Practitioner

## 2017-09-26 DIAGNOSIS — L97212 Non-pressure chronic ulcer of right calf with fat layer exposed: Secondary | ICD-10-CM | POA: Diagnosis not present

## 2017-09-28 NOTE — Progress Notes (Addendum)
[name] Measurements L x W x D 0.3x0.2x0.1 [name] [name] (cm) Area (cm) : 0.047 [name] [name] Volume (cm) : 0.005 [name] [name] % Reduction in Area: 98.40% [name] [name] % Reduction in Volume: 98.20% [name] [name] Classification: Partial Thickness [name] [name] Exudate Amount: Medium [name] [name] Exudate Type: Serous [name] [name] Exudate Color: amber [name] [name] Wound Margin: Distinct, outline attached [name] [name] Granulation Amount: Large (67-100%) [name] [name] Granulation Quality: Pink [name] [name] Necrotic Amount: Small (1-33%) [name] [name] Exposed Structures: Fat Layer (Subcutaneous [name] [name] Tissue) Exposed: Yes Fascia: No Tendon: No Muscle: No Joint: No Bone: No Epithelialization: None [name] [name] Feldner, Lennard R. ([id number]) Periwound Skin Texture: No Abnormalities Noted [name] [name] Periwound Skin Moisture: Maceration: No [name] [name] Periwound Skin Color: Erythema: Yes [name] [name] Erythema Location: Circumferential [name] [name] Temperature: No Abnormality [name] [name] Tenderness on Palpation: Yes [name] [name] Wound Preparation: Ulcer Cleansing: [name] [name] Rinsed/Irrigated with Saline Topical Anesthetic Applied: None Treatment Notes Wound #2 (Right, Distal, Anterior Lower Leg) 1. Cleansed with: Clean wound with Normal Saline Notes Bordered foam Electronic Signature(s) Signed: 09/26/2017 4:55:16 PM By: Lawanda Cousins Entered By: Lawanda Cousins on  09/26/2017 09:42:13 [name], [name] ([id number]) -------------------------------------------------------------------------------- [facility] Details Patient Name: [name], CELLUCCI R. Date of Service: 09/26/2017 9:15 AM Medical Record Number: [id number] Patient Account Number: [id number] Date of Birth/Sex: 25-Oct-1945 (72 y.o. Male) Treating RN: Cornell Barman Primary Care Latrail Pounders: [name] Other Clinician: Referring Shivam Mestas: [name] Treating Timoth Schara/Extender: Cathie Olden in Treatment: 7 Active Inactive Electronic Signature(s) Signed: 10/14/2017 11:34:24 AM By: Gretta Cool, BSN, RN, CWS, Kim RN, BSN Previous Signature: 09/26/2017 5:08:53 PM Version By: Gretta Cool, BSN, RN, CWS, Kim RN, BSN Entered By: Gretta Cool, BSN, RN, CWS, Kim on 10/14/2017 11:34:23 [name] ([id number]) -------------------------------------------------------------------------------- Pain Assessment Details Patient Name: [name], [name] Date of Service: 09/26/2017 9:15 AM Medical Record Number: [id number] Patient Account Number: [id number] Date of Birth/Sex: April 19, 1946 (71 y.o. Male) Treating RN: Cornell Barman Primary Care Kandise Riehle: [name] Other Clinician: Referring Georgenia Salim: [name] Treating Journi Moffa/Extender: Cathie Olden in Treatment: 7 Active Problems Location of Pain Severity and Description of Pain Patient Has Paino No Site Locations With Dressing Change: No Pain Management and Medication Current Pain Management: Electronic Signature(s) Signed: 09/26/2017 5:08:53 PM By: Gretta Cool, BSN, RN, CWS, Kim RN, BSN Entered By: Gretta Cool, BSN, RN, CWS, Kim on 09/26/2017 09:23:55 [name] ([id number]) -------------------------------------------------------------------------------- Patient/Caregiver Education Details Patient Name: [name], [name] Date of Service: 09/26/2017 9:15 AM Medical Record Number: [id number] Patient Account Number: [id number] Date of  Birth/Gender: 1946/08/18 (71 y.o. Male) Treating RN: Cornell Barman Primary Care Physician: [name] Other Clinician: Referring Physician: [name] Treating Physician/Extender: Cathie Olden in Treatment: 7 Education Assessment Education Provided To: Patient Education Topics Provided Wound/Skin Impairment: Handouts: Caring for Your Ulcer Methods: Demonstration Responses: State content correctly Electronic Signature(s) Signed: 09/26/2017 5:08:53 PM By: Gretta Cool, BSN, RN, CWS, Kim RN, BSN Entered By: Gretta Cool, BSN, RN, CWS, Kim on 09/26/2017 09:41:32 [name] ([id number]) -------------------------------------------------------------------------------- Wound Assessment Details Patient Name: [name], [name] Date of Service: 09/26/2017 9:15 AM Medical Record Number: [id number] Patient Account Number: [id number] Date of Birth/Sex: 08-Jun-1946 (71 y.o. Male) Treating RN: Cornell Barman Primary Care Lyndell Allaire: [name] Other Clinician: Referring Kaydence Baba: [name] Treating Kemuel Buchmann/Extender: Lawanda Cousins Weeks in Treatment: 7 Wound Status Wound Number: 2 Primary Venous Leg Ulcer Etiology: Wound Location: Right Lower Leg - Anterior, Distal Wound Status: Open Wounding Event: Gradually Appeared Comorbid Arrhythmia, Hypertension, Type II Diabetes, Date Acquired: 07/18/2017 History: Gout  [name] Measurements L x W x D 0.3x0.2x0.1 [name] [name] (cm) Area (cm) : 0.047 [name] [name] Volume (cm) : 0.005 [name] [name] % Reduction in Area: 98.40% [name] [name] % Reduction in Volume: 98.20% [name] [name] Classification: Partial Thickness [name] [name] Exudate Amount: Medium [name] [name] Exudate Type: Serous [name] [name] Exudate Color: amber [name] [name] Wound Margin: Distinct, outline attached [name] [name] Granulation Amount: Large (67-100%) [name] [name] Granulation Quality: Pink [name] [name] Necrotic Amount: Small (1-33%) [name] [name] Exposed Structures: Fat Layer (Subcutaneous [name] [name] Tissue) Exposed: Yes Fascia: No Tendon: No Muscle: No Joint: No Bone: No Epithelialization: None [name] [name] Feldner, Lennard R. ([id number]) Periwound Skin Texture: No Abnormalities Noted [name] [name] Periwound Skin Moisture: Maceration: No [name] [name] Periwound Skin Color: Erythema: Yes [name] [name] Erythema Location: Circumferential [name] [name] Temperature: No Abnormality [name] [name] Tenderness on Palpation: Yes [name] [name] Wound Preparation: Ulcer Cleansing: [name] [name] Rinsed/Irrigated with Saline Topical Anesthetic Applied: None Treatment Notes Wound #2 (Right, Distal, Anterior Lower Leg) 1. Cleansed with: Clean wound with Normal Saline Notes Bordered foam Electronic Signature(s) Signed: 09/26/2017 4:55:16 PM By: Lawanda Cousins Entered By: Lawanda Cousins on  09/26/2017 09:42:13 [name], [name] ([id number]) -------------------------------------------------------------------------------- [facility] Details Patient Name: [name], CELLUCCI R. Date of Service: 09/26/2017 9:15 AM Medical Record Number: [id number] Patient Account Number: [id number] Date of Birth/Sex: 25-Oct-1945 (72 y.o. Male) Treating RN: Cornell Barman Primary Care Latrail Pounders: [name] Other Clinician: Referring Shivam Mestas: [name] Treating Timoth Schara/Extender: Cathie Olden in Treatment: 7 Active Inactive Electronic Signature(s) Signed: 10/14/2017 11:34:24 AM By: Gretta Cool, BSN, RN, CWS, Kim RN, BSN Previous Signature: 09/26/2017 5:08:53 PM Version By: Gretta Cool, BSN, RN, CWS, Kim RN, BSN Entered By: Gretta Cool, BSN, RN, CWS, Kim on 10/14/2017 11:34:23 [name] ([id number]) -------------------------------------------------------------------------------- Pain Assessment Details Patient Name: [name], [name] Date of Service: 09/26/2017 9:15 AM Medical Record Number: [id number] Patient Account Number: [id number] Date of Birth/Sex: April 19, 1946 (71 y.o. Male) Treating RN: Cornell Barman Primary Care Kandise Riehle: [name] Other Clinician: Referring Georgenia Salim: [name] Treating Journi Moffa/Extender: Cathie Olden in Treatment: 7 Active Problems Location of Pain Severity and Description of Pain Patient Has Paino No Site Locations With Dressing Change: No Pain Management and Medication Current Pain Management: Electronic Signature(s) Signed: 09/26/2017 5:08:53 PM By: Gretta Cool, BSN, RN, CWS, Kim RN, BSN Entered By: Gretta Cool, BSN, RN, CWS, Kim on 09/26/2017 09:23:55 [name] ([id number]) -------------------------------------------------------------------------------- Patient/Caregiver Education Details Patient Name: [name], [name] Date of Service: 09/26/2017 9:15 AM Medical Record Number: [id number] Patient Account Number: [id number] Date of  Birth/Gender: 1946/08/18 (71 y.o. Male) Treating RN: Cornell Barman Primary Care Physician: [name] Other Clinician: Referring Physician: [name] Treating Physician/Extender: Cathie Olden in Treatment: 7 Education Assessment Education Provided To: Patient Education Topics Provided Wound/Skin Impairment: Handouts: Caring for Your Ulcer Methods: Demonstration Responses: State content correctly Electronic Signature(s) Signed: 09/26/2017 5:08:53 PM By: Gretta Cool, BSN, RN, CWS, Kim RN, BSN Entered By: Gretta Cool, BSN, RN, CWS, Kim on 09/26/2017 09:41:32 [name] ([id number]) -------------------------------------------------------------------------------- Wound Assessment Details Patient Name: [name], [name] Date of Service: 09/26/2017 9:15 AM Medical Record Number: [id number] Patient Account Number: [id number] Date of Birth/Sex: 08-Jun-1946 (71 y.o. Male) Treating RN: Cornell Barman Primary Care Lyndell Allaire: [name] Other Clinician: Referring Kaydence Baba: [name] Treating Kemuel Buchmann/Extender: Lawanda Cousins Weeks in Treatment: 7 Wound Status Wound Number: 2 Primary Venous Leg Ulcer Etiology: Wound Location: Right Lower Leg - Anterior, Distal Wound Status: Open Wounding Event: Gradually Appeared Comorbid Arrhythmia, Hypertension, Type II Diabetes, Date Acquired: 07/18/2017 History: Gout  [name] Measurements L x W x D 0.3x0.2x0.1 [name] [name] (cm) Area (cm) : 0.047 [name] [name] Volume (cm) : 0.005 [name] [name] % Reduction in Area: 98.40% [name] [name] % Reduction in Volume: 98.20% [name] [name] Classification: Partial Thickness [name] [name] Exudate Amount: Medium [name] [name] Exudate Type: Serous [name] [name] Exudate Color: amber [name] [name] Wound Margin: Distinct, outline attached [name] [name] Granulation Amount: Large (67-100%) [name] [name] Granulation Quality: Pink [name] [name] Necrotic Amount: Small (1-33%) [name] [name] Exposed Structures: Fat Layer (Subcutaneous [name] [name] Tissue) Exposed: Yes Fascia: No Tendon: No Muscle: No Joint: No Bone: No Epithelialization: None [name] [name] Feldner, Lennard R. ([id number]) Periwound Skin Texture: No Abnormalities Noted [name] [name] Periwound Skin Moisture: Maceration: No [name] [name] Periwound Skin Color: Erythema: Yes [name] [name] Erythema Location: Circumferential [name] [name] Temperature: No Abnormality [name] [name] Tenderness on Palpation: Yes [name] [name] Wound Preparation: Ulcer Cleansing: [name] [name] Rinsed/Irrigated with Saline Topical Anesthetic Applied: None Treatment Notes Wound #2 (Right, Distal, Anterior Lower Leg) 1. Cleansed with: Clean wound with Normal Saline Notes Bordered foam Electronic Signature(s) Signed: 09/26/2017 4:55:16 PM By: Lawanda Cousins Entered By: Lawanda Cousins on  09/26/2017 09:42:13 [name], [name] ([id number]) -------------------------------------------------------------------------------- [facility] Details Patient Name: [name], CELLUCCI R. Date of Service: 09/26/2017 9:15 AM Medical Record Number: [id number] Patient Account Number: [id number] Date of Birth/Sex: 25-Oct-1945 (72 y.o. Male) Treating RN: Cornell Barman Primary Care Latrail Pounders: [name] Other Clinician: Referring Shivam Mestas: [name] Treating Timoth Schara/Extender: Cathie Olden in Treatment: 7 Active Inactive Electronic Signature(s) Signed: 10/14/2017 11:34:24 AM By: Gretta Cool, BSN, RN, CWS, Kim RN, BSN Previous Signature: 09/26/2017 5:08:53 PM Version By: Gretta Cool, BSN, RN, CWS, Kim RN, BSN Entered By: Gretta Cool, BSN, RN, CWS, Kim on 10/14/2017 11:34:23 [name] ([id number]) -------------------------------------------------------------------------------- Pain Assessment Details Patient Name: [name], [name] Date of Service: 09/26/2017 9:15 AM Medical Record Number: [id number] Patient Account Number: [id number] Date of Birth/Sex: April 19, 1946 (71 y.o. Male) Treating RN: Cornell Barman Primary Care Kandise Riehle: [name] Other Clinician: Referring Georgenia Salim: [name] Treating Journi Moffa/Extender: Cathie Olden in Treatment: 7 Active Problems Location of Pain Severity and Description of Pain Patient Has Paino No Site Locations With Dressing Change: No Pain Management and Medication Current Pain Management: Electronic Signature(s) Signed: 09/26/2017 5:08:53 PM By: Gretta Cool, BSN, RN, CWS, Kim RN, BSN Entered By: Gretta Cool, BSN, RN, CWS, Kim on 09/26/2017 09:23:55 [name] ([id number]) -------------------------------------------------------------------------------- Patient/Caregiver Education Details Patient Name: [name], [name] Date of Service: 09/26/2017 9:15 AM Medical Record Number: [id number] Patient Account Number: [id number] Date of  Birth/Gender: 1946/08/18 (71 y.o. Male) Treating RN: Cornell Barman Primary Care Physician: [name] Other Clinician: Referring Physician: [name] Treating Physician/Extender: Cathie Olden in Treatment: 7 Education Assessment Education Provided To: Patient Education Topics Provided Wound/Skin Impairment: Handouts: Caring for Your Ulcer Methods: Demonstration Responses: State content correctly Electronic Signature(s) Signed: 09/26/2017 5:08:53 PM By: Gretta Cool, BSN, RN, CWS, Kim RN, BSN Entered By: Gretta Cool, BSN, RN, CWS, Kim on 09/26/2017 09:41:32 [name] ([id number]) -------------------------------------------------------------------------------- Wound Assessment Details Patient Name: [name], [name] Date of Service: 09/26/2017 9:15 AM Medical Record Number: [id number] Patient Account Number: [id number] Date of Birth/Sex: 08-Jun-1946 (71 y.o. Male) Treating RN: Cornell Barman Primary Care Lyndell Allaire: [name] Other Clinician: Referring Kaydence Baba: [name] Treating Kemuel Buchmann/Extender: Lawanda Cousins Weeks in Treatment: 7 Wound Status Wound Number: 2 Primary Venous Leg Ulcer Etiology: Wound Location: Right Lower Leg - Anterior, Distal Wound Status: Open Wounding Event: Gradually Appeared Comorbid Arrhythmia, Hypertension, Type II Diabetes, Date Acquired: 07/18/2017 History: Gout  Imaging (photographs - any number of wounds) []  - 0 Wound Tracing (instead of photographs) X- 1 5 Simple Wound Measurement - one wound []  - 0 Complex Wound Measurement - multiple wounds INTERVENTIONS - Wound Dressings X - Small Wound Dressing one or multiple wounds 1 10 []  - 0 Medium Wound Dressing one or multiple wounds []  - 0 Large Wound Dressing one or multiple wounds []  - 0 Application of Medications - topical []  - 0 Application of Medications - injection INTERVENTIONS - Miscellaneous []  - External ear exam 0 []  - 0 Specimen Collection (cultures, biopsies, blood, body fluids, etc.) []  - 0 Specimen(s) / Culture(s) sent or taken to Lab for analysis []  - 0 Patient Transfer (multiple staff / [employer] fast streamer / Similar devices) []  - 0 Simple Staple / Suture removal (25 or less) []  - 0 Complex Staple / Suture removal (26 or more) []  - 0 Hypo / Hyperglycemic Management (close monitor of Blood Glucose) []  - 0 Ankle / Brachial Index (ABI) - do not check if billed separately X- 1 5 Vital Signs [name], KANOUSE R. ([id number]) Has the patient been seen at the hospital within the last three years: Yes Total Score: 65 Level Of Care: New/Established - Level 2 Electronic Signature(s) Signed: 09/26/2017 5:08:53 PM By: Gretta Cool, BSN, RN, CWS, Kim RN, BSN Entered By: Gretta Cool, BSN, RN, CWS, Kim on 09/26/2017 09:40:29 [name] ([id number]) -------------------------------------------------------------------------------- Encounter Discharge Information Details Patient Name: [name], ELMAN R. Date of Service: 09/26/2017 9:15 AM Medical Record Number: [id number] Patient Account Number: [id number] Date of Birth/Sex: 20-Apr-1946 (71 y.o. Male) Treating RN: Cornell Barman Primary Care Janny Crute: [name] Other  Clinician: Referring Jadia Capers: [name] Treating Randon Somera/Extender: Cathie Olden in Treatment: 7 Encounter Discharge Information Items Discharge Pain Level: 0 Discharge Condition: Stable Ambulatory Status: Ambulatory Discharge Destination: Home Transportation: Private Auto Accompanied By: self Schedule Follow-up Appointment: Yes Medication Reconciliation completed and Yes provided to Patient/Care Haasini Patnaude: Patient Clinical Summary of Care: Declined Electronic Signature(s) Signed: 09/26/2017 5:08:53 PM By: Gretta Cool, BSN, RN, CWS, Kim RN, BSN Entered By: Gretta Cool, BSN, RN, CWS, Kim on 09/26/2017 09:41:22 [name] ([id number]) -------------------------------------------------------------------------------- Lower Extremity Assessment Details Patient Name: [name], [name] Date of Service: 09/26/2017 9:15 AM Medical Record Number: [id number] Patient Account Number: [id number] Date of Birth/Sex: [birth year] (71 y.o. Male) Treating RN: Cornell Barman Primary Care Hiroko Tregre: [name] Other Clinician: Referring Reznor Ferrando: [name] Treating Seeley Southgate/Extender: Cathie Olden in Treatment: 7 Vascular Assessment Pulses: Dorsalis Pedis Palpable: [Right:Yes] Posterior Tibial Extremity colors, hair growth, and conditions: Extremity Color: [Right:Normal] Hair Growth on Extremity: [Right:Yes] Temperature of Extremity: [Right:Warm] Electronic Signature(s) Signed: 09/26/2017 5:08:53 PM By: Gretta Cool, BSN, RN, CWS, Kim RN, BSN Entered By: Gretta Cool, BSN, RN, CWS, Kim on 09/26/2017 [name], [name] ([id number]) -------------------------------------------------------------------------------- Multi Wound Chart Details Patient Name: [name], BARTOLOMEI R. Date of Service: 09/26/2017 9:15 AM Medical Record Number: [id number] Patient Account Number: [id number] Date of Birth/Sex: 20-Jun-1946 (71 y.o. Male) Treating RN: Cornell Barman Primary Care Georgia Baria: [name] Other  Clinician: Referring Brookley Spitler: [name] Treating Bryahna Lesko/Extender: Lawanda Cousins Weeks in Treatment: 7 Vital Signs Height(in): 73 Pulse(bpm): 82 Weight(lbs): 243.3 Blood Pressure(mmHg): 147/93 Body Mass Index(BMI): 32 Temperature(F): 98.3 Respiratory Rate 16 (breaths/min): Photos: [[name]:[name]] Wound Location: Right Lower Leg - Anterior, [name] [name] Distal Wounding Event: Gradually Appeared [name] [name] Primary Etiology: Venous Leg Ulcer [name] [name] Comorbid History: Arrhythmia, Hypertension, [name] [name] Type II Diabetes, Gout Date Acquired: 07/18/2017 [name] [name] Weeks of Treatment: 7 [name] [name] Wound Status: Open [name]

## 2017-09-28 NOTE — Progress Notes (Signed)
[name], [name] ([id number]) Visit Report for 09/26/2017 Chief Complaint Document Details Patient Name: [name], [name] Date of Service: 09/26/2017 9:15 AM Medical Record Number: [id number] Patient Account Number: [id number] Date of Birth/Sex: Feb 26, 1946 (72 y.o. Male) Treating RN: Cornell Barman Primary Care Provider: [name] Other Clinician: Referring Provider: [name] Treating Provider/Extender: Cathie Olden in Treatment: 7 Information Obtained from: Patient Chief Complaint He is here in follow up evaluation of a right lower extremity ulcer Electronic Signature(s) Signed: 09/26/2017 4:55:16 PM By: Lawanda Cousins Entered By: Lawanda Cousins on 09/26/2017 09:42:41 [name], [name] ([id number]) -------------------------------------------------------------------------------- HPI Details Patient Name: [name], [name] R. Date of Service: 09/26/2017 9:15 AM Medical Record Number: [id number] Patient Account Number: [id number] Date of Birth/Sex: [birth year] (70 y.o. Male) Treating RN: Cornell Barman Primary Care Provider: [name] Other Clinician: Referring Provider: [name] Treating Provider/Extender: Cathie Olden in Treatment: 7 History of Present Illness Location: right upper calf medially Quality: Patient reports experiencing a sharp pain to affected area(s). Severity: Patient states wound are getting worse. Duration: Patient has had the wound for < 3 weeks prior to presenting for treatment Timing: Pain in wound is Intermittent (comes and goes Context: The wound would happen gradually Modifying Factors: Other treatment(s) tried include:local care with an antibiotic ointment Associated Signs and Symptoms: Patient reports having increase swelling. 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 the patient has had a vascular workup over 7 years ago and said everything was normal at that stage. He does not have any chronic problems except for cardiac issues which he sees a cardiologist in [place]. 08/15/2017 -- arterial and venous duplex studies still pending. 08/23/2017 -- venous reflux studies done on 08/13/2017 shows venous incompetence throughout the left lower extremity deep system and focally at the left saphenofemoral junction. 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 No evidence of SVT or DVT in bilateral lower extremities The patient has an appointment at the end of the month to get his arterial duplex study done 09/05/2017 -- the patient was seen at the vein and vascular office yesterday by Angelena Form. ABI studies were notable for medial calcification and the toe brachial indices were normal and bilateral ankle-brachial) waveforms were normal with triphasic flow. After review of his venous studies he was not a candidate for laser ablation and his lymphedema was to be treated with compression stockings and lymphedema pump pumps 09/12/2017 -- had a low arterial study done at the [facility] -- unable to obtain reliable ABI is due to medial calcification. Bilateral toe indices were normal with the right being 1.01 and the left being 0.92 and the waveforms were triphasic bilaterally. he did get hold of 30-40 mm compression stockings but is unable to put these on. We will try and get him alternative compression stockings. 09/26/17- he is here in follow up evaluation of a right lower extremity ulcer;he is compliant in wearing compression stocking; ulcer almost epithelialized , anticipate healing next appointment Electronic  Signature(s) Signed: 09/26/2017 4:55:16 PM By: Lawanda Cousins Entered By: Lawanda Cousins on 09/26/2017 09:43:51 [name], [name] ([id number]) -------------------------------------------------------------------------------- Physician Orders Details Patient Name: [name], [name] R. Date of Service: 09/26/2017 9:15 AM Medical Record Number: [id number] Patient Account Number: [id number] Date  of Birth/Sex: [birth year] (71 y.o. Male) Treating RN: Cornell Barman Primary Care Provider: [name] Other Clinician: Referring Provider: [name] Treating Provider/Extender: Cathie Olden in Treatment: 7 Verbal / Phone Orders: No Diagnosis Coding Wound Cleansing Wound #2 Right,Distal,Anterior Lower Leg o Clean wound with Normal Saline. o Cleanse wound with mild soap and water o May Shower, gently pat wound dry prior to applying new dressing. Primary Wound Dressing Wound #2 Right,Distal,Anterior Lower Leg o Boardered Foam Dressing Dressing Change Frequency Wound #2 Right,Distal,Anterior Lower Leg o Other: - As needed Follow-up Appointments o Return Appointment in 1 week. Edema Control Wound #2 Right,Distal,Anterior Lower Leg o Elevate legs to the level of the heart and pump ankles as often as possible Additional Orders / Instructions Wound #2 Right,Distal,Anterior Lower Leg o Increase protein intake. Medications-please add to medication list. Wound #2 Right,Distal,Anterior Lower Leg o Other: - Vitamin C, Zinc, MVI Electronic Signature(s) Signed: 09/26/2017 4:55:16 PM By: Lawanda Cousins Entered By: Lawanda Cousins on 09/26/2017 09:44:15 [name], [name] ([id number]) -------------------------------------------------------------------------------- Problem List Details Patient Name: [name], [name] R. Date of Service: 09/26/2017 9:15 AM Medical Record Number: [id number] Patient Account Number: [id number] Date of Birth/Sex: 03/28/1946 (71 y.o. Male) Treating RN: Cornell Barman Primary Care Provider: [name] Other Clinician: Referring Provider: [name] Treating Provider/Extender: Lawanda Cousins Weeks in Treatment: 7 Active Problems ICD-10 Encounter Code Description Active Date Diagnosis L97.212 Non-pressure chronic ulcer of right calf with fat layer exposed 08/08/2017 Yes I89.0 Lymphedema, not elsewhere classified 08/08/2017 Yes I73.9 Peripheral vascular disease, unspecified 08/08/2017 Yes Inactive Problems Resolved Problems Electronic Signature(s) Signed: 09/26/2017 4:55:16 PM By: Lawanda Cousins Entered By: Lawanda Cousins on 09/26/2017 09:42:01 [name] ([id number]) -------------------------------------------------------------------------------- Progress Note Details Patient Name: [name] Nevin R. Date of Service: 09/26/2017 9:15 AM Medical Record Number: [id number] Patient Account Number: [id number] Date of Birth/Sex: [birth year] (71 y.o. Male) Treating RN: Cornell Barman Primary Care Provider: [name] Other Clinician: Referring Provider: [name] Treating Provider/Extender: Cathie Olden in Treatment: 7 Subjective Chief Complaint Information obtained from Patient He is here in follow up evaluation of a right lower extremity ulcer History of Present Illness (HPI) The following HPI elements were documented for the patient's wound: Location: right upper calf medially Quality: Patient reports experiencing a sharp pain to affected area(s). Severity: Patient states wound are getting worse. Duration: Patient has had the wound for < 3 weeks prior to presenting for treatment Timing: Pain in wound is Intermittent (comes and goes Context: The wound would happen gradually Modifying Factors: Other treatment(s) tried include:local care with an antibiotic ointment Associated Signs and Symptoms: Patient reports having increase swelling. 71 year old male who has a past medical history of essential hypertension, chronic atrial  fibrillation, peripheral vascular disease, nonischemic cardiomyopathy,venous stasis dermatitis, gouty arthropathy, basal cell carcinoma of the right lower extremity, benign prostatic hypertrophy, long-term use of anticoagulation therapy, hyperglycemia and exercise intolerance has never been a smoker. the patient has had a vascular workup over 7 years ago and said everything was normal at that stage. He does not have any chronic problems except for cardiac issues which he sees a cardiologist in [place]. 08/15/2017 -- arterial and venous duplex studies still pending. 08/23/2017 -- venous reflux studies done on 08/13/2017 shows venous incompetence throughout the left lower extremity deep system and focally at the left saphenofemoral junction. 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 No evidence of SVT or DVT in bilateral lower extremities The patient has an appointment at the end of the month  to get his arterial duplex study done 09/05/2017 -- the patient was seen at the vein and vascular office yesterday by Angelena Form. ABI studies were notable for medial calcification and the toe brachial indices were normal and bilateral ankle-brachial) waveforms were normal with triphasic flow. After review of his venous studies he was not a candidate for laser ablation and his lymphedema was to be treated with compression stockings and lymphedema pump pumps 09/12/2017 -- had a low arterial study done at the [facility] -- unable to obtain reliable ABI is due to medial calcification. Bilateral toe indices were normal with the right being 1.01 and the left being 0.92 and the waveforms were triphasic bilaterally. he did get hold of 30-40 mm compression stockings but is unable to put these on. We will try and get him alternative compression stockings. 09/26/17- he is here in follow up evaluation of a right lower extremity ulcer;he is compliant in wearing compression  stocking; ulcer almost epithelialized , anticipate healing next appointment [name], [name] ([id number]) Objective Constitutional Vitals Time Taken: 9:23 AM, Height: 73 in, Weight: 243.3 lbs, BMI: 32.1, Temperature: 98.3 F, Pulse: 82 bpm, Respiratory Rate: 16 breaths/min, Blood Pressure: 147/93 mmHg. Integumentary (Hair, Skin) Wound #2 status is Open. Original cause of wound was Gradually Appeared. The wound is located on the Right,Distal,Anterior Lower Leg. The wound measures 0.3cm length x 0.2cm width x 0.1cm depth; 0.047cm^2 area and 0.005cm^3 volume. There is Fat Layer (Subcutaneous Tissue) Exposed exposed. There is no tunneling or undermining noted. There is a medium amount of serous drainage noted. The wound margin is distinct with the outline attached to the wound base. There is large (67-100%) pink granulation within the wound bed. There is a small (1-33%) amount of necrotic tissue within the wound bed including Adherent Slough. The periwound skin appearance had no abnormalities noted for texture. The periwound skin appearance had no abnormalities noted for moisture. The periwound skin appearance had no abnormalities noted for color. Periwound temperature was noted as No Abnormality. The periwound has tenderness on palpation. Assessment Active Problems ICD-10 L97.212 - Non-pressure chronic ulcer of right calf with fat layer exposed I89.0 - Lymphedema, not elsewhere classified I73.9 - Peripheral vascular disease, unspecified Plan Wound Cleansing: Wound #2 Right,Distal,Anterior Lower Leg: Clean wound with Normal Saline. Cleanse wound with mild soap and water May Shower, gently pat wound dry prior to applying new dressing. Primary Wound Dressing: Wound #2 Right,Distal,Anterior Lower Leg: Boardered Foam Dressing Dressing Change Frequency: Wound #2 Right,Distal,Anterior Lower Leg: Other: - As needed Follow-up Appointments: Return Appointment in 1 week. Edema Control: Wound  #2 Right,Distal,Anterior Lower Leg: [name], GRIMA. ([id number]) Elevate legs to the level of the heart and pump ankles as often as possible Additional Orders / Instructions: Wound #2 Right,Distal,Anterior Lower Leg: Increase protein intake. Medications-please add to medication list.: Wound #2 Right,Distal,Anterior Lower Leg: Other: - Vitamin C, Zinc, MVI 1. foam border 2. compression stockings 3. follow up next week, anticipate discharge next week Electronic Signature(s) Signed: 09/26/2017 4:55:16 PM By: Lawanda Cousins Entered By: Lawanda Cousins on 09/26/2017 09:44:46 [place], [name] ([id number]) -------------------------------------------------------------------------------- SuperBill Details Patient Name: [name] Nevin R. Date of Service: 09/26/2017 Medical Record Number: [id number] Patient Account Number: [id number] Date of Birth/Sex: [birth year] (71 y.o. Male) Treating RN: Cornell Barman Primary Care Provider: [name] Other Clinician: Referring Provider: [name] Treating Provider/Extender: Lawanda Cousins Weeks in Treatment: 7 Diagnosis Coding ICD-10 Codes Code Description [phone] Non-pressure chronic ulcer of right calf with fat layer exposed  I89.0 Lymphedema, not elsewhere classified I73.9 Peripheral vascular disease, unspecified Facility Procedures CPT4 Code: [id number] Description: [phone] - WOUND CARE VISIT-LEV 2 EST PT Modifier: Quantity: 1 Physician Procedures CPT4 Code: [id number] Description: 50388 - WC PHYS LEVEL 2 - EST PT ICD-10 Diagnosis Description E28.003 Non-pressure chronic ulcer of right calf with fat layer exp Modifier: osed Quantity: 1 Electronic Signature(s) Signed: 09/26/2017 4:55:16 PM By: Lawanda Cousins Entered By: Lawanda Cousins on 09/26/2017 09:45:02

## 2017-10-02 NOTE — Progress Notes (Signed)
Patient: [name], Male    DOB: 1946/06/08, 71 y.o.   MRN: [id number] Visit Date: 10/03/2017  Today's Provider: [name], MD   Chief Complaint  Patient presents with  . Annual Exam  . Hypertension   Subjective:   Patient saw McKenzie today at 8:45 am for AVW.   Annual physical exam [name] is a 71 y.o. male who presents today for health maintenance and complete physical. He feels well. He reports exercising yes. He reports he is sleeping well.  ----------------------------------------------------------------    Hypertension, follow-up:  BP Readings from Last 3 Encounters:  10/03/17 120/84  09/25/17 135/84  09/04/17 (!) 150/89    He was last seen for hypertension 1 years ago.  BP at that visit was 154/84. Management since that visit includes; no changes.He reports good compliance with treatment. He is not having side effects. none He is exercising. He is adherent to low salt diet.   Outside blood pressures are not checking. He is experiencing none.  Patient denies none.   Cardiovascular risk factors include advanced age (older than 56 for men, 53 for women).  Use of agents associated with hypertension: none.   ----------------------------------------------------------------    Review of Systems  Constitutional: Negative for chills, diaphoresis and fever.  HENT: Negative for congestion, ear discharge, ear pain, hearing loss, nosebleeds, sore throat and tinnitus.   Eyes: Negative for photophobia, pain, discharge and redness.  Respiratory: Negative for cough, shortness of breath, wheezing and stridor.   Cardiovascular: Negative for chest pain, palpitations and leg swelling.  Gastrointestinal: Negative for abdominal pain, blood in stool, constipation, diarrhea, nausea and vomiting.  Endocrine: Negative for polydipsia.  Genitourinary: Negative for dysuria, flank pain, frequency, hematuria and urgency.  Musculoskeletal: Negative for back  pain, myalgias and neck pain.  Skin: Negative for rash.  Allergic/Immunologic: Negative for environmental allergies.  Neurological: Negative for dizziness, tremors, seizures, weakness and headaches.  Hematological: Does not bruise/bleed easily.  Psychiatric/Behavioral: Negative for hallucinations and suicidal ideas. The patient is not nervous/anxious.   All other systems reviewed and are negative.   Social History      He  reports that  has never smoked. he has never used smokeless tobacco. He reports that he drinks about 1.2 - 1.8 oz of alcohol per week. He reports that he does not use drugs.       Social History   Socioeconomic History  . Marital status: Single    Spouse name: None  . Number of children: 1  . Years of education: None  . Highest education level: Master's degree (e.g., MA, MS, MEng, MEd, MSW, MBA)  Social Needs  . Financial resource strain: Not hard at all  . Food insecurity - worry: Never true  . Food insecurity - inability: Never true  . Transportation needs - medical: No  . Transportation needs - non-medical: No  Occupational History  . Occupation: retired Pharmacist, hospital  Tobacco Use  . Smoking status: Never Smoker  . Smokeless tobacco: Never Used  Substance and Sexual Activity  . Alcohol use: Yes    Alcohol/week: 1.2 - 1.8 oz    Types: 2 - 3 Cans of beer per week  . Drug use: No  . Sexual activity: None  Other Topics Concern  . None  Social History Narrative   Divorced father of 1. Retired Education officer, museum.   ~2 beers / day. Does not smoke.   Works out 6 days a weeks - cardio &  weights. He used to run and play basketball prior to his hip surgery in 2007.   Currently dating.    Past Medical History:  Diagnosis Date  . BPH with obstruction/lower urinary tract symptoms   . Chronic atrial fibrillation ([facility])   . Dislocation of shoulder, anterior, right, closed 07/24/2014  . Echocardiogram abnormal    2009 moderate to severely dilated left atrium  . Erectile  dysfunction   . Essential hypertension   . Gout   . History of chicken pox   . History of measles   . Hyperglycemia   . Hypogonadism in male   . Nocturia   . Peyronie's disease   . Traumatic tear of right rotator cuff 07/24/2014  . Venous stasis    chronic     Patient Active Problem List   Diagnosis Date Noted  . Complex renal cyst, left 04/22/2017  . Fatty liver 04/22/2017  . NICM (nonischemic cardiomyopathy) ([facility]) 08/21/2016  . Exercise intolerance 12/09/2015  . BPH with obstruction/lower urinary tract symptoms 09/27/2015  . Erectile dysfunction of organic origin 09/27/2015  . Arthralgia of right hand 09/23/2015  . Basal cell carcinoma of right lower extremity 03/24/2015  . Hyperglycemia 08/16/2014  . Dislocation of shoulder, anterior, right, closed 07/24/2014  . Traumatic tear of right rotator cuff 07/24/2014  . Venous stasis dermatitis 11/02/2013  . Long term current use of anticoagulant therapy 12/24/2012  . Benign prostatic hypertrophy without urinary obstruction 03/25/2010  . Gouty arthropathy 03/24/2010  . Peripheral vascular disease ([facility]) 12/19/2009  . Essential hypertension 11/18/2009  . Chronic atrial fibrillation ([facility]) - CHA2DS2-VASc Score 3; On Xarelto 11/18/2009    Class: Chronic    Past Surgical History:  Procedure Laterality Date  . CARDIAC CATHETERIZATION  03/28/2004   normal coronaries, reduced EF at 25-30% (Dr. Gerrie Nordmann)  . CARDIOVERSION, TEE guided  03/29/2004   Dr. Gerrie Nordmann   . COLONOSCOPY  05/28/2013   Dr. Rayann Heman  . HERNIA REPAIR  age 89  . KNEE SURGERY    . LUMBAR FUSION  age 39  . MYOVIEW CARDIOVASCULAR STRESS TEST  07/2003   anterior wall thinning, LV systolic function depressed at 33%  . PILONIDAL CYST EXCISION    . Right Eyebrow Tumor Removed  age 43   . SHOULDER ARTHROSCOPY WITH ROTATOR CUFF REPAIR AND SUBACROMIAL DECOMPRESSION Right 08/19/2014   Procedure: SHOULDER ARTHROSCOPY WITH ROTATOR CUFF REPAIR AND SUBACROMIAL DECOMPRESSION;   Surgeon: Lorn Junes, MD;  Location: [facility];  Service: Orthopedics;  Laterality: Right;  . TOTAL HIP ARTHROPLASTY Right 03/25/2006   Dr. Wanda Plump. [place]  . TRANSTHORACIC ECHOCARDIOGRAM  08/2008   EF=>55%, mild-mod dilated; LA mod-severely dilated; RA mild-mod dilated; mild calcif of MV, borderline MVP, trace MR; trace TR; mild pulm valve regurg     Family History        Family Status  Relation Name Status  . Father  Deceased at age 8  . Mother  Alive       has back problems, borderline Diabetic  . MGF  Deceased at age 47       stroke at 14  . PGF  Deceased at age 1       diabetes  . Brother  Alive  . MGM  Deceased at age 20  . PGM  Deceased at age 12  . Sister  Alive  . Mat Uncle  Deceased  . Brother  Alive  . Sister  Alive  . Child  (Not Specified)  .  Neg Hx  (Not Specified)        His family history includes Arthritis in his sister; Bladder Cancer in his father; Diabetes in his maternal uncle and paternal grandfather; Hypertension in his father and mother; Prostate cancer in his father; Stroke in his maternal grandfather; Supraventricular tachycardia in his brother and child.     No Known Allergies   Current Outpatient Medications:  .  benazepril (LOTENSIN) 20 MG tablet, take 1 tablet by mouth twice a day, Disp: 60 tablet, Rfl: 9 .  carvedilol (COREG) 12.5 MG tablet, take 1 tablet by mouth twice a day, Disp: 60 tablet, Rfl: 6 .  chlorthalidone (HYGROTON) 25 MG tablet, Take 1 tablet (25 mg total) by mouth daily., Disp: 30 tablet, Rfl: 11 .  Multiple Vitamin (MULTIVITAMIN) tablet, Take 1 tablet by mouth daily.  , Disp: , Rfl:  .  XARELTO 20 MG TABS tablet, take 1 tablet by mouth once daily WITH SUPPER, Disp: 90 tablet, Rfl: 1   Patient Care Team: Birdie Sons, MD as PCP - General (Family Medicine) Leonie Man, MD as Consulting Physician (Cardiology) Dasher, Rayvon Char, MD (Dermatology) Nori Riis, PA-C as Physician Assistant (Urology)        Objective:   Vitals:  BP  120/84 (BP Location: Left Arm)     Pulse  64     Temp  98.7 F (37.1 C) (Oral)     Ht  6\' 3"  (1.905 m)     Wt  244 lb 3.2 oz (110.8 kg)      BMI  30.52 kg/m         Physical Exam   General Appearance:    Alert, cooperative, no distress, appears stated age  Head:    Normocephalic, without obvious abnormality, atraumatic  Eyes:    PERRL, conjunctiva/corneas clear, EOM's intact, fundi    benign, both eyes       Ears:    Normal TM's and external ear canals, both ears  Nose:   Nares normal, septum midline, mucosa normal, no drainage   or sinus tenderness  Throat:   Lips, mucosa, and tongue normal; teeth and gums normal  Neck:   Supple, symmetrical, trachea midline, no adenopathy;       thyroid:  No enlargement/tenderness/nodules; no carotid   bruit or JVD  Back:     Symmetric, no curvature, ROM normal, no CVA tenderness  Lungs:     Clear to auscultation bilaterally, respirations unlabored  Chest wall:    No tenderness or deformity  Heart:     Irregularly irregular rhythm. Normal rate , no murmur, rub   or gallop  Abdomen:     Soft, non-tender, bowel sounds active all four quadrants,    no masses, no organomegaly  Genitalia:    deferred  Rectal:    deferred  Extremities:   Extremities normal, atraumatic, no cyanosis or edema  Pulses:   2+ and symmetric all extremities  Skin:   Skin color, texture, turgor normal, no rashes or lesions  Lymph nodes:   Cervical, supraclavicular, and axillary nodes normal  Neurologic:   CNII-XII intact. Normal strength, sensation and reflexes      throughout    Depression Screen PHQ 2/9 Scores 10/03/2017 10/03/2017 09/26/2016 09/26/2015  PHQ - 2 Score 0 0 0 0  PHQ- 9 Score 0 - - 0      Assessment & Plan:     Routine Health Maintenance and Physical Exam  Exercise Activities and Dietary  recommendations Goals    . DIET - INCREASE WATER INTAKE     Recommend increasing water intake to 3-4 glasses  a day.        Immunization History  Administered Date(s) Administered  . Influenza-Unspecified 06/25/2017  . Pneumococcal Conjugate-13 09/20/2014  . Pneumococcal Polysaccharide-23 09/26/2015  . Tdap 03/27/2011  . Zoster 03/24/2010    Health Maintenance  Topic Date Due  . Samul Dada  03/26/2021  . COLONOSCOPY  05/29/2023  . INFLUENZA VACCINE  Completed  . Hepatitis C Screening  Completed  . PNA vac Low Risk Adult  Completed     Discussed health benefits of physical activity, and encouraged him to engage in regular exercise appropriate for his age and condition.    --------------------------------------------------------------------   1. Annual physical exam Doing very well. PSA done by urology.   2. Essential hypertension Well controlled.  Continue current medications.      [name], MD  [facility]

## 2017-10-03 ENCOUNTER — Ambulatory Visit (INDEPENDENT_AMBULATORY_CARE_PROVIDER_SITE_OTHER): Payer: Medicare Other

## 2017-10-03 ENCOUNTER — Ambulatory Visit (INDEPENDENT_AMBULATORY_CARE_PROVIDER_SITE_OTHER): Payer: Medicare Other | Admitting: Family Medicine

## 2017-10-03 ENCOUNTER — Encounter: Payer: Self-pay | Admitting: Family Medicine

## 2017-10-03 VITALS — BP 120/84 | HR 64 | Temp 98.7°F | Ht 75.0 in | Wt 244.2 lb

## 2017-10-03 DIAGNOSIS — Z0000 Encounter for general adult medical examination without abnormal findings: Secondary | ICD-10-CM | POA: Diagnosis not present

## 2017-10-03 DIAGNOSIS — I10 Essential (primary) hypertension: Secondary | ICD-10-CM | POA: Diagnosis not present

## 2017-10-03 MED ORDER — AMOXICILLIN 500 MG PO CAPS: 500.0000 mg | ORAL_CAPSULE | Freq: Two times a day (BID) | ORAL | 0 refills | Status: DC

## 2017-10-03 NOTE — Progress Notes (Signed)
Subjective:   [name] is a 71 y.o. male who presents for an Initial Medicare Annual Wellness Visit.  Review of Systems  [name]  Cardiac Risk Factors include: advanced age (>67men, >28 women);hypertension;male gender;obesity (BMI >30kg/m2)    Objective:    Today's Vitals   10/03/17 0833 10/03/17 0839  BP: 120/84   Pulse: 64   Temp: 98.7 F (37.1 C)   TempSrc: Oral   Weight: 244 lb 3.2 oz (110.8 kg)   Height: 6\' 3"  (1.905 m)   PainSc: 0-No pain 0-No pain   Body mass index is 30.52 kg/m.  Advanced Directives 10/03/2017 05/30/2015 08/19/2014 08/17/2014 07/23/2014  Does Patient Have a Medical Advance Directive? No No No Yes No  Type of Advance Directive - - - Living will -  Does patient want to make changes to medical advance directive? - - - No - Patient declined -  Would patient like information on creating a medical advance directive? No - Patient declined No - patient declined information - - No - patient declined information    Current Medications (verified) Outpatient Encounter Medications as of 10/03/2017  Medication Sig  . benazepril (LOTENSIN) 20 MG tablet take 1 tablet by mouth twice a day  . carvedilol (COREG) 12.5 MG tablet take 1 tablet by mouth twice a day (Patient not taking: Reported on 10/03/2017)  . chlorthalidone (HYGROTON) 25 MG tablet Take 1 tablet (25 mg total) by mouth daily. (Patient not taking: Reported on 10/03/2017)  . Multiple Vitamin (MULTIVITAMIN) tablet Take 1 tablet by mouth daily.    Alveda Reasons 20 MG TABS tablet take 1 tablet by mouth once daily WITH SUPPER (Patient not taking: Reported on 10/03/2017)   No facility-administered encounter medications on file as of 10/03/2017.     Allergies (verified) Patient has no known allergies.   History: Past Medical History:  Diagnosis Date  . BPH with obstruction/lower urinary tract symptoms   . Chronic atrial fibrillation ([place])   . Dislocation of shoulder, anterior, right, closed 07/24/2014   . Echocardiogram abnormal    2009 moderate to severely dilated left atrium  . Erectile dysfunction   . Essential hypertension   . Gout   . History of chicken pox   . History of measles   . Hyperglycemia   . Hypogonadism in male   . Nocturia   . Peyronie's disease   . Traumatic tear of right rotator cuff 07/24/2014  . Venous stasis    chronic   Past Surgical History:  Procedure Laterality Date  . CARDIAC CATHETERIZATION  03/28/2004   normal coronaries, reduced EF at 25-30% (Dr. Gerrie Nordmann)  . CARDIOVERSION, TEE guided  03/29/2004   Dr. Gerrie Nordmann   . COLONOSCOPY  05/28/2013   Dr. Rayann Heman  . HERNIA REPAIR  age 28  . KNEE SURGERY    . LUMBAR FUSION  age 66  . MYOVIEW CARDIOVASCULAR STRESS TEST  07/2003   anterior wall thinning, LV systolic function depressed at 33%  . PILONIDAL CYST EXCISION    . Right Eyebrow Tumor Removed  age 78   . SHOULDER ARTHROSCOPY WITH ROTATOR CUFF REPAIR AND SUBACROMIAL DECOMPRESSION Right 08/19/2014   Procedure: SHOULDER ARTHROSCOPY WITH ROTATOR CUFF REPAIR AND SUBACROMIAL DECOMPRESSION;  Surgeon: Lorn Junes, MD;  Location: [facility];  Service: Orthopedics;  Laterality: Right;  . TOTAL HIP ARTHROPLASTY Right 03/25/2006   Dr. Wanda Plump. [place]  . TRANSTHORACIC ECHOCARDIOGRAM  08/2008   EF=>55%, mild-mod dilated; LA mod-severely dilated; RA mild-mod  dilated; mild calcif of MV, borderline MVP, trace MR; trace TR; mild pulm valve regurg    Family History  Problem Relation Age of Onset  . Hypertension Father   . Prostate cancer Father   . Bladder Cancer Father   . Hypertension Mother   . Stroke Maternal Grandfather   . Diabetes Paternal Grandfather   . Supraventricular tachycardia Brother   . Arthritis Sister        RA  . Diabetes Maternal Uncle        TYPE 2  . Supraventricular tachycardia Child   . Kidney disease Neg Hx   . Heart attack Neg Hx   . Colon cancer Neg Hx    Social History   Socioeconomic History  . Marital status:  Single    Spouse name: None  . Number of children: 1  . Years of education: None  . Highest education level: Master's degree (e.g., MA, MS, MEng, MEd, MSW, MBA)  Social Needs  . Financial resource strain: Not hard at all  . Food insecurity - worry: Never true  . Food insecurity - inability: Never true  . Transportation needs - medical: No  . Transportation needs - non-medical: No  Occupational History  . Occupation: retired Pharmacist, hospital  Tobacco Use  . Smoking status: Never Smoker  . Smokeless tobacco: Never Used  Substance and Sexual Activity  . Alcohol use: Yes    Alcohol/week: 1.2 - 1.8 oz    Types: 2 - 3 Cans of beer per week  . Drug use: No  . Sexual activity: None  Other Topics Concern  . None  Social History Narrative   Divorced father of 1. Retired Education officer, museum.   ~2 beers / day. Does not smoke.   Works out 6 days a weeks - cardio & weights. He used to run and play basketball prior to his hip surgery in 2007.   Currently dating.   Tobacco Counseling Counseling given: Not Answered   Clinical Intake:  Pre-visit preparation completed: Yes  Pain : No/denies pain Pain Score: 0-No pain     Nutritional Status: BMI > 30  Obese Nutritional Risks: None Diabetes: No     Interpreter Needed?: No  Information entered by :: Mmarkoski, LPN  Activities of Daily Living In your present state of health, do you have any difficulty performing the following activities: 10/03/2017  Hearing? N  Vision? N  Difficulty concentrating or making decisions? N  Walking or climbing stairs? Y  Comment due to A-fib  Dressing or bathing? N  Doing errands, shopping? N  Preparing Food and eating ? N  Using the Toilet? N  In the past six months, have you accidently leaked urine? N  Do you have problems with loss of bowel control? N  Managing your Medications? N  Managing your Finances? N  Housekeeping or managing your Housekeeping? N  Some recent data might be hidden       Immunizations and Health Maintenance Immunization History  Administered Date(s) Administered  . Influenza-Unspecified 06/25/2017  . Pneumococcal Conjugate-13 09/20/2014  . Pneumococcal Polysaccharide-23 09/26/2015  . Tdap 03/27/2011  . Zoster 03/24/2010   There are no preventive care reminders to display for this patient.  Patient Care Team: Birdie Sons, MD as PCP - General (Family Medicine) Leonie Man, MD as Consulting Physician (Cardiology) Dasher, Rayvon Char, MD (Dermatology) Nori Riis, PA-C as Physician Assistant (Urology)  Indicate any recent Medical Services you may have received from other than [facility] providers  in the past year (date may be approximate).    Assessment:   This is a routine wellness examination for Devondre.  Hearing/Vision screen Vision Screening Comments: Pt does not have regular vision checks and does not know when his last eye exam was. Pt declines issues with vision.   Dietary issues and exercise activities discussed: Current Exercise Habits: Structured exercise class, Type of exercise: strength training/weights;stretching;walking, Time (Minutes): 45, Frequency (Times/Week): 4, Weekly Exercise (Minutes/Week): 180, Intensity: Mild, Exercise limited by: None identified  Goals    . DIET - INCREASE WATER INTAKE     Recommend increasing water intake to 3-4 glasses a day.       Depression Screen PHQ 2/9 Scores 10/03/2017 10/03/2017 09/26/2016 09/26/2015  PHQ - 2 Score 0 0 0 0  PHQ- 9 Score 0 - - 0    Fall Risk Fall Risk  10/03/2017 09/26/2016 09/26/2015  Falls in the past year? No No Yes  Number falls in past yr: - - 1  Injury with Fall? - - No  Follow up - - Falls evaluation completed    Is the patient's home free of loose throw rugs in walkways, pet beds, electrical cords, etc?   yes      Grab bars in the bathroom? No, denies use at this time      Handrails on the stairs?   yes      Adequate lighting?   yes  Timed Get Up and Go  performed: [name]  Cognitive Function: Pt declined screening today.        Screening Tests Health Maintenance  Topic Date Due  . TETANUS/TDAP  03/26/2021  . COLONOSCOPY  05/29/2023  . INFLUENZA VACCINE  Completed  . Hepatitis C Screening  Completed  . PNA vac Low Risk Adult  Completed    Qualifies for Shingles Vaccine? Due for Shingles vaccine. Declined my offer to administer today. Education has been provided regarding the importance of this vaccine. Pt has been advised to call her insurance company to determine her out of pocket expense. Advised she may also receive this vaccine at her local pharmacy or Health Dept. Verbalized acceptance and understanding.  Cancer Screenings: Lung: Low Dose CT Chest recommended if Age 46-80 years, 30 pack-year currently smoking OR have quit w/in 15years. Patient does not qualify. Colorectal: Up to date  Additional Screenings:  Hepatitis B/HIV/Syphillis: Pt declines today.  Hepatitis C Screening: Up to date     Plan:  I have personally reviewed and addressed the Medicare Annual Wellness questionnaire and have noted the following in the patient's chart:  A. Medical and social history B. Use of alcohol, tobacco or illicit drugs  C. Current medications and supplements D. Functional ability and status E.  Nutritional status F.  Physical activity G. Advance directives H. List of other physicians I.  Hospitalizations, surgeries, and ER visits in previous 12 months J.  [place] such as hearing and vision if needed, cognitive and depression L. Referrals and appointments - none  In addition, I have reviewed and discussed with patient certain preventive protocols, quality metrics, and best practice recommendations. A written personalized care plan for preventive services as well as general preventive health recommendations were provided to patient.  See attached scanned questionnaire for additional information.   Signed,  Fabio Neighbors, LPN Nurse Health Advisor   Nurse Recommendations: None.

## 2017-10-03 NOTE — Patient Instructions (Addendum)
   The CDC recommends two doses of Shingrix (the shingles vaccine) separated by 2 to 6 months for adults age 69 years and older. I recommend checking with your pharmacy regarding coverage for this vaccine.    Please contact your eyecare professional to schedule a routine eye exam

## 2017-10-03 NOTE — Patient Instructions (Signed)
[name] , Thank you for taking time to come for your Medicare Wellness Visit. I appreciate your ongoing commitment to your health goals. Please review the following plan we discussed and let me know if I can assist you in the future.   Screening recommendations/referrals: Colonoscopy: Up to date Recommended yearly ophthalmology/optometry visit for glaucoma screening and checkup Recommended yearly dental visit for hygiene and checkup  Vaccinations: Influenza vaccine: Up to date Pneumococcal vaccine: Up to date Tdap vaccine: Up to date Shingles vaccine: Zostavax completed 03/24/10, pt declined Shingrix  Advanced directives: Advance directive discussed with you today. Even though you declined this today please call our office should you change your mind and we can give you the proper paperwork for you to fill out.  Conditions/risks identified: Obesity; Recommend increasing water intake to 3-4 glasses a day.   Next appointment: 9:30 AM today  Preventive Care 69 Years and Older, Male Preventive care refers to lifestyle choices and visits with your health care provider that can promote health and wellness. What does preventive care include?  A yearly physical exam. This is also called an annual well check.  Dental exams once or twice a year.  Routine eye exams. Ask your health care provider how often you should have your eyes checked.  Personal lifestyle choices, including:  Daily care of your teeth and gums.  Regular physical activity.  Eating a healthy diet.  Avoiding tobacco and drug use.  Limiting alcohol use.  Practicing safe sex.  Taking low doses of aspirin every day.  Taking vitamin and mineral supplements as recommended by your health care provider. What happens during an annual well check? The services and screenings done by your health care provider during your annual well check will depend on your age, overall health, lifestyle risk factors, and family history  of disease. Counseling  Your health care provider may ask you questions about your:  Alcohol use.  Tobacco use.  Drug use.  Emotional well-being.  Home and relationship well-being.  Sexual activity.  Eating habits.  History of falls.  Memory and ability to understand (cognition).  Work and work Statistician. Screening  You may have the following tests or measurements:  Height, weight, and BMI.  Blood pressure.  Lipid and cholesterol levels. These may be checked every 5 years, or more frequently if you are over 37 years old.  Skin check.  Lung cancer screening. You may have this screening every year starting at age 59 if you have a 30-pack-year history of smoking and currently smoke or have quit within the past 15 years.  Fecal occult blood test (FOBT) of the stool. You may have this test every year starting at age 34.  Flexible sigmoidoscopy or colonoscopy. You may have a sigmoidoscopy every 5 years or a colonoscopy every 10 years starting at age 65.  Prostate cancer screening. Recommendations will vary depending on your family history and other risks.  Hepatitis C blood test.  Hepatitis B blood test.  Sexually transmitted disease (STD) testing.  Diabetes screening. This is done by checking your blood sugar (glucose) after you have not eaten for a while (fasting). You may have this done every 1-3 years.  Abdominal aortic aneurysm (AAA) screening. You may need this if you are a current or former smoker.  Osteoporosis. You may be screened starting at age 70 if you are at high risk. Talk with your health care provider about your test results, treatment options, and if necessary, the need for more tests.  Vaccines  Your health care provider may recommend certain vaccines, such as:  Influenza vaccine. This is recommended every year.  Tetanus, diphtheria, and acellular pertussis (Tdap, Td) vaccine. You may need a Td booster every 10 years.  Zoster vaccine. You may  need this after age 55.  Pneumococcal 13-valent conjugate (PCV13) vaccine. One dose is recommended after age 64.  Pneumococcal polysaccharide (PPSV23) vaccine. One dose is recommended after age 43. Talk to your health care provider about which screenings and vaccines you need and how often you need them. This information is not intended to replace advice given to you by your health care provider. Make sure you discuss any questions you have with your health care provider. Document Released: 10/21/2015 Document Revised: 06/13/2016 Document Reviewed: 07/26/2015 Elsevier Interactive Patient Education  2017 [place] Prevention in the Home Falls can cause injuries. They can happen to people of all ages. There are many things you can do to make your home safe and to help prevent falls. What can I do on the outside of my home?  Regularly fix the edges of walkways and driveways and fix any cracks.  Remove anything that might make you trip as you walk through a door, such as a raised step or threshold.  Trim any bushes or trees on the path to your home.  Use bright outdoor lighting.  Clear any walking paths of anything that might make someone trip, such as rocks or tools.  Regularly check to see if handrails are loose or broken. Make sure that both sides of any steps have handrails.  Any raised decks and porches should have guardrails on the edges.  Have any leaves, snow, or ice cleared regularly.  Use sand or salt on walking paths during winter.  Clean up any spills in your garage right away. This includes oil or grease spills. What can I do in the bathroom?  Use night lights.  Install grab bars by the toilet and in the tub and shower. Do not use towel bars as grab bars.  Use non-skid mats or decals in the tub or shower.  If you need to sit down in the shower, use a plastic, non-slip stool.  Keep the floor dry. Clean up any water that spills on the floor as soon as it  happens.  Remove soap buildup in the tub or shower regularly.  Attach bath mats securely with double-sided non-slip rug tape.  Do not have throw rugs and other things on the floor that can make you trip. What can I do in the bedroom?  Use night lights.  Make sure that you have a light by your bed that is easy to reach.  Do not use any sheets or blankets that are too big for your bed. They should not hang down onto the floor.  Have a firm chair that has side arms. You can use this for support while you get dressed.  Do not have throw rugs and other things on the floor that can make you trip. What can I do in the kitchen?  Clean up any spills right away.  Avoid walking on wet floors.  Keep items that you use a lot in easy-to-reach places.  If you need to reach something above you, use a strong step stool that has a grab bar.  Keep electrical cords out of the way.  Do not use floor polish or wax that makes floors slippery. If you must use wax, use non-skid floor wax.  Do not have throw rugs and other things on the floor that can make you trip. What can I do with my stairs?  Do not leave any items on the stairs.  Make sure that there are handrails on both sides of the stairs and use them. Fix handrails that are broken or loose. Make sure that handrails are as long as the stairways.  Check any carpeting to make sure that it is firmly attached to the stairs. Fix any carpet that is loose or worn.  Avoid having throw rugs at the top or bottom of the stairs. If you do have throw rugs, attach them to the floor with carpet tape.  Make sure that you have a light switch at the top of the stairs and the bottom of the stairs. If you do not have them, ask someone to add them for you. What else can I do to help prevent falls?  Wear shoes that:  Do not have high heels.  Have rubber bottoms.  Are comfortable and fit you well.  Are closed at the toe. Do not wear sandals.  If you  use a stepladder:  Make sure that it is fully opened. Do not climb a closed stepladder.  Make sure that both sides of the stepladder are locked into place.  Ask someone to hold it for you, if possible.  Clearly mark and make sure that you can see:  Any grab bars or handrails.  First and last steps.  Where the edge of each step is.  Use tools that help you move around (mobility aids) if they are needed. These include:  Canes.  Walkers.  Scooters.  Crutches.  Turn on the lights when you go into a dark area. Replace any light bulbs as soon as they burn out.  Set up your furniture so you have a clear path. Avoid moving your furniture around.  If any of your floors are uneven, fix them.  If there are any pets around you, be aware of where they are.  Review your medicines with your doctor. Some medicines can make you feel dizzy. This can increase your chance of falling. Ask your doctor what other things that you can do to help prevent falls. This information is not intended to replace advice given to you by your health care provider. Make sure you discuss any questions you have with your health care provider. Document Released: 07/21/2009 Document Revised: 03/01/2016 Document Reviewed: 10/29/2014 Elsevier Interactive Patient Education  2017 [employer].

## 2017-10-10 ENCOUNTER — Ambulatory Visit: Payer: Medicare Other | Admitting: Physician Assistant

## 2017-12-19 ENCOUNTER — Other Ambulatory Visit: Payer: Self-pay | Admitting: Cardiology

## 2017-12-31 ENCOUNTER — Ambulatory Visit: Payer: Medicare Other | Admitting: Cardiology

## 2017-12-31 ENCOUNTER — Encounter: Payer: Self-pay | Admitting: Cardiology

## 2017-12-31 VITALS — BP 130/80 | HR 79 | Ht 75.0 in | Wt 243.0 lb

## 2017-12-31 DIAGNOSIS — I482 Chronic atrial fibrillation, unspecified: Secondary | ICD-10-CM

## 2017-12-31 DIAGNOSIS — Z7901 Long term (current) use of anticoagulants: Secondary | ICD-10-CM | POA: Diagnosis not present

## 2017-12-31 DIAGNOSIS — I872 Venous insufficiency (chronic) (peripheral): Secondary | ICD-10-CM

## 2017-12-31 DIAGNOSIS — I428 Other cardiomyopathies: Secondary | ICD-10-CM

## 2017-12-31 DIAGNOSIS — I10 Essential (primary) hypertension: Secondary | ICD-10-CM

## 2017-12-31 DIAGNOSIS — I739 Peripheral vascular disease, unspecified: Secondary | ICD-10-CM | POA: Diagnosis not present

## 2017-12-31 LAB — COMPREHENSIVE METABOLIC PANEL WITH GFR
ALT: 20 IU/L (ref 0–44)
AST: 19 IU/L (ref 0–40)
Albumin/Globulin Ratio: 1.6 (ref 1.2–2.2)
Albumin: 4.6 g/dL (ref 3.6–4.8)
Alkaline Phosphatase: 73 IU/L (ref 39–117)
BUN/Creatinine Ratio: 12 (ref 10–24)
BUN: 12 mg/dL (ref 8–27)
Bilirubin Total: 0.8 mg/dL (ref 0.0–1.2)
CO2: 28 mmol/L (ref 20–29)
Calcium: 9.9 mg/dL (ref 8.6–10.2)
Chloride: 91 mmol/L — ABNORMAL LOW (ref 96–106)
Creatinine, Ser: 1 mg/dL (ref 0.76–1.27)
GFR calc Af Amer: 88 mL/min/1.73
GFR calc non Af Amer: 76 mL/min/1.73
Globulin, Total: 2.9 g/dL (ref 1.5–4.5)
Glucose: 112 mg/dL — ABNORMAL HIGH (ref 65–99)
Potassium: 4.1 mmol/L (ref 3.5–5.2)
Sodium: 135 mmol/L (ref 134–144)
Total Protein: 7.5 g/dL (ref 6.0–8.5)

## 2017-12-31 LAB — LIPID PANEL
Chol/HDL Ratio: 2.6 ratio (ref 0.0–5.0)
Cholesterol, Total: 161 mg/dL (ref 100–199)
HDL: 63 mg/dL
LDL Calculated: 89 mg/dL (ref 0–99)
Triglycerides: 47 mg/dL (ref 0–149)
VLDL Cholesterol Cal: 9 mg/dL (ref 5–40)

## 2017-12-31 MED ORDER — BENAZEPRIL HCL 20 MG PO TABS: ORAL_TABLET | ORAL | 9 refills | Status: DC

## 2017-12-31 NOTE — Patient Instructions (Addendum)
Medication instructions  Decrease benazepril to (1/2 tablet of 20 mg)at bedtime and continue 20 mg in the morning.  No other changes .   Labs- nothing to eat or drink the morning of the test cmp  Lipid  Your physician recommends that you schedule a follow-up appointment in 6 month with  [facility].   Your physician wants you to follow-up in [place]. You will receive a reminder letter in the mail two months in advance. If you don't receive a letter, please call our office to schedule the follow-up appointment.   If you need a refill on your cardiac medications before your next appointment, please call your pharmacy.

## 2017-12-31 NOTE — Assessment & Plan Note (Addendum)
ABIs checked back in November showed normal ABI waveform with normal TBI's, but calcified peripheral arteries.  He denies any claudication symptoms.  He does have venous stasis.  We will check lipid panel and chemistry panel for baseline since they have not been checked since 2017. -Last LDL was 99.

## 2017-12-31 NOTE — Progress Notes (Signed)
PCP: Birdie Sons, MD  Clinic Note: Chief Complaint  Patient presents with  . Follow-up    No major complaint.  Edema controlled  . Atrial Fibrillation    Asymptomatic    HPI: [name] is a 72 y.o. male with a PMH below who presents today for annual follow-up for chronic/persistent atrial fibrillation dating back to 2005. Initially associated with nonischemic cardiomyopathy/currently resolved EF of 55% by an echocardiogram in 2009. 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 --> Compression Stockings recommended, foot elevation.  CEDERICK BROADNAX was last seen in March 2018.  Fine mild otherwise no sensation of being in A. fib.  Recent Hospitalizations: None  Studies Personally Reviewed - (if available, images/films reviewed: From Epic Chart or Care Everywhere)  November 2018 Lower extremity arterial and venous Dopplers:  Venous incompetence noted throughout the left lower extremity deep system focally of the left saphenofemoral junction.  No incompetence of the right lower extremity.  No DVT  Normal TBI's.  Unable to obtain reliable ABIs due to calcification, normal waveform.  Interval History: Hardy returns today overall doing quite well from a cardiac standpoint.  He notes that his swelling is notably improved since starting the chlorthalidone.  He does not wear his compression stockings routinely, stating that they are very hard to put on.  He does have a venous stasis changes, but has had a complete recovery and healing of his right leg wound. He denies any sensation whatsoever being in atrial fibrillation.  Only if he tries to go fast will he noticed that his heart rate goes up.  When he does note that is in the morning when he first gets up he feels a little bit lightheaded and dizzy.  Otherwise once he gets up and going for the day he feels quite fine.  He has his baseline shortness of breath that has not not really changed  and is pretty much the same as it has been before.  No real change to any exertional dyspnea.  No resting dyspnea.  No PND orthopnea. No resting exertional chest tightness or pressure.  No sensation of irregular heartbeat or palpitations.  No syncope/near syncope. No TIA/amaurosis fugax symptoms. No melena, hematochezia, hematuria, or epstaxis. No claudication.  ROS: A comprehensive was performed. Review of Systems  Constitutional: Negative for chills, diaphoresis, fever, malaise/fatigue and weight loss.  HENT: Negative for congestion and nosebleeds.   Respiratory: Positive for shortness of breath (Only exertional.  No change fromBaseline). Negative for cough and wheezing.   Gastrointestinal: Negative for abdominal pain, blood in stool, heartburn and melena.  Genitourinary: Negative for flank pain and hematuria.  Musculoskeletal: Positive for joint pain (Normal osteoarthritis pains.). Negative for myalgias.  Neurological: Positive for dizziness. Negative for focal weakness, seizures and weakness.  Psychiatric/Behavioral: Negative for depression and memory loss. The patient is not nervous/anxious and does not have insomnia.   All other systems reviewed and are negative. (R shin wound - now well healed)  I have reviewed and (if needed) personally updated the patient's problem list, medications, allergies, past medical and surgical history, social and family history.   Past Medical History:  Diagnosis Date  . BPH with obstruction/lower urinary tract symptoms   . Chronic atrial fibrillation ([place])   . Dislocation of shoulder, anterior, right, closed 07/24/2014  . Echocardiogram abnormal    2009 moderate to severely dilated left atrium  . Erectile dysfunction   . Essential hypertension   . Gout   .  History of chicken pox   . History of measles   . Hyperglycemia   . Hypogonadism in male   . Nocturia   . Peyronie's disease   . Traumatic tear of right rotator cuff 07/24/2014  . Venous  stasis    chronic    Past Surgical History:  Procedure Laterality Date  . CARDIAC CATHETERIZATION  03/28/2004   normal coronaries, reduced EF at 25-30% (Dr. Gerrie Nordmann)  . CARDIOVERSION, TEE guided  03/29/2004   Dr. Gerrie Nordmann   . COLONOSCOPY  05/28/2013   Dr. Rayann Heman  . HERNIA REPAIR  age 44  . KNEE SURGERY    . LUMBAR FUSION  age 66  . MYOVIEW CARDIOVASCULAR STRESS TEST  07/2003   anterior wall thinning, LV systolic function depressed at 33%  . PILONIDAL CYST EXCISION    . Right Eyebrow Tumor Removed  age 89   . SHOULDER ARTHROSCOPY WITH ROTATOR CUFF REPAIR AND SUBACROMIAL DECOMPRESSION Right 08/19/2014   Procedure: SHOULDER ARTHROSCOPY WITH ROTATOR CUFF REPAIR AND SUBACROMIAL DECOMPRESSION;  Surgeon: Lorn Junes, MD;  Location: [facility];  Service: Orthopedics;  Laterality: Right;  . TOTAL HIP ARTHROPLASTY Right 03/25/2006   Dr. Wanda Plump. [place]  . TRANSTHORACIC ECHOCARDIOGRAM  08/2008   EF=>55%, mild-mod dilated; LA mod-severely dilated; RA mild-mod dilated; mild calcif of MV, borderline MVP, trace MR; trace TR; mild pulm valve regurg     Current Meds  Medication Sig  . benazepril (LOTENSIN) 20 MG tablet TAKE 20 MG IN THE MORNING AND 10 MG IN THE EVENING  . carvedilol (COREG) 12.5 MG tablet take 1 tablet by mouth twice a day  . chlorthalidone (HYGROTON) 25 MG tablet TAKE 1 TABLET BY MOUTH ONCE DAILY  . Multiple Vitamin (MULTIVITAMIN) tablet Take 1 tablet by mouth daily.    Alveda Reasons 20 MG TABS tablet take 1 tablet by mouth once daily WITH SUPPER  . [DISCONTINUED] benazepril (LOTENSIN) 20 MG tablet take 1 tablet by mouth twice a day    No Known Allergies  Social History   Tobacco Use  . Smoking status: Never Smoker  . Smokeless tobacco: Never Used  Substance Use Topics  . Alcohol use: Yes    Alcohol/week: 1.2 - 1.8 oz    Types: 2 - 3 Cans of beer per week  . Drug use: No   Social History   Social History Narrative   Divorced father of 1. Retired Surveyor, [employer].   ~2 beers / day. Does not smoke.   Works out 6 days a weeks - cardio & weights. He used to run and play basketball prior to his hip surgery in 2007.   Currently dating.    family history includes Arthritis in his sister; Bladder Cancer in his father; Diabetes in his maternal uncle and paternal grandfather; Hypertension in his father and mother; Prostate cancer in his father; Stroke in his maternal grandfather; Supraventricular tachycardia in his brother and child.  Wt Readings from Last 3 Encounters:  12/31/17 243 lb (110.2 kg)  10/03/17 244 lb 3.2 oz (110.8 kg)  09/25/17 239 lb (108.4 kg)    PHYSICAL EXAM BP 130/80   Pulse 79   Ht 6\' 3"  (1.905 m)   Wt 243 lb (110.2 kg)   BMI 30.37 kg/m  Physical Exam  Constitutional: He is oriented to person, place, and time. He appears well-developed and well-nourished. No distress.  Healthy-appearing.  Mildly obese.  Well-groomed.  He has dark tan.  HENT:  Head: Normocephalic and  atraumatic.  Neck: Normal range of motion. Neck supple. No hepatojugular reflux and no JVD present. Carotid bruit is not present. No tracheal deviation present. No thyromegaly present.  Cardiovascular: Normal rate, S1 normal and S2 normal. An irregularly irregular rhythm present. PMI is not displaced. Exam reveals distant heart sounds. Exam reveals no gallop, no S4, no friction rub and no decreased pulses (Mildly decreased pedal pulses, but palpable (1+)).  No murmur heard. Pulmonary/Chest: Effort normal and breath sounds normal. No respiratory distress. He has no wheezes. He has no rales.  Abdominal: Soft. Bowel sounds are normal. He exhibits no distension. There is no tenderness. There is no rebound.  Protuberant, obese.  No HSM  Musculoskeletal: Normal range of motion. He exhibits edema (Trivial bilateral left greater than right edema.).  Neurological: He is alert and oriented to person, place, and time.  Skin: Skin is warm and dry.  Bilateral lower  extremities with venous stasis dermatitis noted left greater than right.  Psychiatric: He has a normal mood and affect. His behavior is normal. Judgment and thought content normal.   Tauren returns here today for annual follow-up doing quite well.  He states that his swelling is pretty stable, but he is not  Adult ECG Report  Rate: 79 ;  Rhythm: atrial fibrillation and Nonspecific IVCD.  Low voltage in limb leads;   Narrative Interpretation: Stable EKG   Other studies Reviewed: Additional studies/ records that were reviewed today include:  Recent Labs: No labs since 2017 available.   ASSESSMENT / PLAN: Problem List Items Addressed This Visit    Venous stasis dermatitis (Chronic)    Being followed now by the [facility] --> severe deep venous insufficiency noted in the left system but no significant Incompetence noted in the right. Compression stockings and elevate feet.      Peripheral vascular disease (HCC) (Chronic)    ABIs checked back in November showed normal ABI waveform with normal TBI's, but calcified peripheral arteries.  He denies any claudication symptoms.  He does have venous stasis.  We will check lipid panel and chemistry panel for baseline since they have not been checked since 2017. -Last LDL was 99.      Relevant Medications   benazepril (LOTENSIN) 20 MG tablet   Other Relevant Orders   Lipid panel (Completed)   Comprehensive metabolic panel (Completed)   NICM (nonischemic cardiomyopathy) ([place]) (Chronic)    Essentially resolved as of 2009 echo.  We have not checked one since.      Relevant Medications   benazepril (LOTENSIN) 20 MG tablet   Long term current use of anticoagulant therapy (Chronic)    Tolerating Xarelto with no bleeding      Essential hypertension (Chronic)    Well-controlled today.  Because of his dizziness, we will back off on his evening dose of benazepril to allow for his morning pressures to be slightly higher.   Otherwise we will continue current medications with benazepril 20 mg in the morning, 10 mg at night, carvedilol 12.5 twice daily and chlorthalidone 25 mg.  We are not titrating beta-blocker any further because of-history of chronotropic incompetence.      Relevant Medications   benazepril (LOTENSIN) 20 MG tablet   Other Relevant Orders   Comprehensive metabolic panel (Completed)   Chronic atrial fibrillation (HCC) - CHA2DS2-VASc Score 3; On Xarelto - Primary (Chronic)    Chronic asymptomatic A. fib at baseline.  He continues to have exertional dyspnea that is no change from  last year.  Probably when his heart rate gets fast he has some diastolic dysfunction symptoms no real sensation of rapid irregular heartbeats. Rate controlled with.  Carvedilol. Anticoagulated with Xarelto.      Relevant Medications   benazepril (LOTENSIN) 20 MG tablet   Other Relevant Orders   EKG 12-Lead (Completed)   Lipid panel (Completed)   Comprehensive metabolic panel (Completed)      Current medicines are reviewed at length with the patient today. (+/- concerns) Dizzy in the morning  The following changes have been made:  Reduce evening dose of ACE inhibitor.  Patient Instructions  Medication instructions  Decrease benazepril to (1/2 tablet of 20 mg)at bedtime and continue 20 mg in the morning.  No other changes .   Labs- nothing to eat or drink the morning of the test cmp  Lipid  Your physician recommends that you schedule a follow-up appointment in 6 month with  [facility].   Your physician wants you to follow-up in [place]. You will receive a reminder letter in the mail two months in advance. If you don't receive a letter, please call our office to schedule the follow-up appointment.   If you need a refill on your cardiac medications before your next appointment, please call your pharmacy.      Studies Ordered:   Orders Placed This Encounter  Procedures  . Lipid  panel  . Comprehensive metabolic panel  . EKG 12-Lead      Glenetta Hew, M.D., M.S. Interventional Cardiologist   Pager # [phone] Phone # [phone] [address] [place], [place] [address]   Thank you for choosing [facility]!!

## 2017-12-31 NOTE — Assessment & Plan Note (Signed)
Being followed now by the [facility] team --> severe deep venous insufficiency noted in the left system but no significant Incompetence noted in the right. Compression stockings and elevate feet.

## 2018-01-01 ENCOUNTER — Encounter: Payer: Self-pay | Admitting: Cardiology

## 2018-01-01 NOTE — Assessment & Plan Note (Signed)
Chronic asymptomatic A. fib at baseline.  He continues to have exertional dyspnea that is no change from last year.  Probably when his heart rate gets fast he has some diastolic dysfunction symptoms no real sensation of rapid irregular heartbeats. Rate controlled with.  Carvedilol. Anticoagulated with Xarelto.

## 2018-01-01 NOTE — Assessment & Plan Note (Signed)
Well-controlled today.  Because of his dizziness, we will back off on his evening dose of benazepril to allow for his morning pressures to be slightly higher.  Otherwise we will continue current medications with benazepril 20 mg in the morning, 10 mg at night, carvedilol 12.5 twice daily and chlorthalidone 25 mg.  We are not titrating beta-blocker any further because of-history of chronotropic incompetence.

## 2018-01-01 NOTE — Assessment & Plan Note (Signed)
Essentially resolved as of 2009 echo.  We have not checked one since.

## 2018-01-01 NOTE — Assessment & Plan Note (Signed)
Tolerating Xarelto with no bleeding

## 2018-01-20 ENCOUNTER — Other Ambulatory Visit: Payer: Self-pay | Admitting: Cardiology

## 2018-01-20 NOTE — Telephone Encounter (Signed)
Rx has been sent to the pharmacy electronically. ° °

## 2018-01-27 ENCOUNTER — Other Ambulatory Visit: Payer: Self-pay

## 2018-01-27 MED ORDER — BENAZEPRIL HCL 20 MG PO TABS: ORAL_TABLET | ORAL | 5 refills | Status: DC

## 2018-01-27 NOTE — Telephone Encounter (Signed)
Rx request sent to pharmacy.  

## 2018-02-04 ENCOUNTER — Telehealth: Payer: Self-pay | Admitting: Cardiology

## 2018-02-04 NOTE — Telephone Encounter (Signed)
New Message:     STAT if patient feels like he/she is going to faint   1) Are you dizzy now? No  2) Do you feel faint or have you passed out? This weekend  3) Do you have any other symptoms? Pt states when he stands up he just blacks out.  4) Have you checked your HR and BP (record if available)? No

## 2018-02-04 NOTE — Telephone Encounter (Signed)
Spoke with pt, he reports when he first gets up in the morning he will feel dizzy and sick to his stomach. He will lay back down and after a while once he gets up he feels fine. He has no way to check his blood pressure. He has not noticed a change since the decrease in the benazepril in the evening. Will forward for dr hardings review and advise.

## 2018-02-04 NOTE — Telephone Encounter (Signed)
My recommendation will be to stop the nighttime dose of benazepril and simply take it in the morning after breakfast.  Glenetta Hew, MD

## 2018-02-05 MED ORDER — BENAZEPRIL HCL 20 MG PO TABS: 20.0000 mg | ORAL_TABLET | Freq: Every day | ORAL | 5 refills | Status: DC

## 2018-02-05 NOTE — Telephone Encounter (Signed)
Spoke with pt, aware of dr Darcus Pester recommendations. He will call if he continues to have problems.

## 2018-03-18 ENCOUNTER — Other Ambulatory Visit: Payer: Self-pay | Admitting: Cardiology

## 2018-03-18 MED ORDER — RIVAROXABAN 20 MG PO TABS: 20.0000 mg | ORAL_TABLET | Freq: Every day | ORAL | 1 refills | Status: DC

## 2018-03-18 NOTE — Telephone Encounter (Signed)
New Message    *STAT* If patient is at the pharmacy, call can be transferred to refill team.   1. Which medications need to be refilled? (please list name of each medication and dose if known) XARELTO 20 MG TABS tablet  2. Which pharmacy/location (including street and city if local pharmacy) is medication to be sent to? [facility] [address] [place], [place] [address], ph: [phone] fax: [phone]  3. Do they need a 30 day or 90 day supply?

## 2018-03-20 ENCOUNTER — Telehealth: Payer: Self-pay | Admitting: Cardiology

## 2018-03-20 DIAGNOSIS — I10 Essential (primary) hypertension: Secondary | ICD-10-CM

## 2018-03-20 MED ORDER — RIVAROXABAN 20 MG PO TABS: 20.0000 mg | ORAL_TABLET | Freq: Every day | ORAL | 1 refills | Status: DC

## 2018-03-20 MED ORDER — CARVEDILOL 12.5 MG PO TABS: 12.5000 mg | ORAL_TABLET | Freq: Two times a day (BID) | ORAL | 1 refills | Status: DC

## 2018-03-20 NOTE — Telephone Encounter (Signed)
New Message:         *STAT* If patient is at the pharmacy, call can be transferred to refill team.   1. Which medications need to be refilled? (please list name of each medication and dose if known) rivaroxaban (XARELTO) 20 MG TABS tablet  carvedilol (COREG) 12.5 MG tablet  Hydrochlorothiazide 25 mg  2. Which pharmacy/location (including street and city if local pharmacy) is medication to be sent to?[facility], [place]  3. Do they need a 30 day or 90 day supply? [place]

## 2018-03-20 NOTE — Addendum Note (Signed)
Addended by: Rockne Menghini on: 03/20/2018 05:04 PM   Modules accepted: Orders

## 2018-05-05 ENCOUNTER — Telehealth: Payer: Self-pay | Admitting: Cardiology

## 2018-05-05 NOTE — Telephone Encounter (Signed)
Returned the call to the patient. He stated that starting last week, he has been having dizzy spells to the point that it makes him nauseous. He does not check his blood pressure or pulse at home. He stated that he has been staying hydrated and denies swelling and chest pain. The patient does have chronic atrial fibrillation.   Per the patient request, an appointment has been made for 05/06/18.

## 2018-05-05 NOTE — Telephone Encounter (Signed)
New message  STAT if patient feels like he/she is going to faint   1) Are you dizzy now? No   2) Do you feel faint or have you passed out? No   3) Do you have any other symptoms?nausea   4) Have you checked your HR and BP (record if available)? No

## 2018-05-06 ENCOUNTER — Ambulatory Visit: Payer: Medicare Other | Admitting: Physician Assistant

## 2018-05-06 ENCOUNTER — Encounter: Payer: Self-pay | Admitting: Physician Assistant

## 2018-05-06 VITALS — BP 140/90 | HR 81 | Ht 75.0 in | Wt 239.8 lb

## 2018-05-06 DIAGNOSIS — I482 Chronic atrial fibrillation, unspecified: Secondary | ICD-10-CM

## 2018-05-06 DIAGNOSIS — R2689 Other abnormalities of gait and mobility: Secondary | ICD-10-CM

## 2018-05-06 DIAGNOSIS — R42 Dizziness and giddiness: Secondary | ICD-10-CM

## 2018-05-06 DIAGNOSIS — Z7901 Long term (current) use of anticoagulants: Secondary | ICD-10-CM

## 2018-05-06 DIAGNOSIS — I10 Essential (primary) hypertension: Secondary | ICD-10-CM | POA: Diagnosis not present

## 2018-05-06 NOTE — Progress Notes (Signed)
Cardiology Office Note   Date:  05/06/2018   ID:  Tlaloc, Taddei 11-08-1945, MRN [id number]  PCP:  Birdie Sons, MD  Cardiologist: Dr. Ellyn Hack, 01/01/2018 Rosaria Ferries, PA-C   Chief Complaint  Patient presents with  . office visit    dizziness x2 weeks, has fallen down, happens to him at random times    History of Present Illness: [name] is a 73 y.o. male with a history of chronic A. Fib, NICM w/ EF improved to 55% x 2009, bilateral severe venous stasis dermatitis and lymphedema>> compression stockings, obesity  3/26 office visit, patient weight 243 pounds, heart rate 79, volume at baseline, lipids and chemistry checked, no med changes.  Subsequent phone note recommended benazepril once a day instead of twice 7/29 phone note describing dizzy spells, no vital signs available, no lower extremity edema, appointment made  [name] presents for cardiology follow up.  He had been shocked back into SR years ago. Did well for several years, but was back in Afib after a dose of Theraflu for an upper respiratory infection. Has been in Atrial fib ever since.  He is generally not aware of the atrial fib. He does cardio and lifts weights at the [facility], but only feels anything when he goes up steps, then gets SOB.  For several months, he has had problems with dizzy spells. They are random, and may not occur for days or weeks. However, when he gets them, he will feel himself get off balance and start to fall. He will end up on the floor. He does not have time to sit down.  They have been occurring more frequently recently.  At first, they were intermittent and have progressed to the point that he is at risk for them all the time.  There is no LOC. He was briefly knocked out when he his his head after he fell in the parking lot, but woke up immediately.   He feels his balance has deteriorated over the last few months. [place] a week ago when he took off his pants and  got off balance standing up. He only gets symptoms when standing or walking. No LOC. No relation to distance walked.   No orthopnea or PND. LE edema is controlled by chlorthalidone.    Past Medical History:  Diagnosis Date  . BPH with obstruction/lower urinary tract symptoms   . Chronic atrial fibrillation ([place])   . Dislocation of shoulder, anterior, right, closed 07/24/2014  . Echocardiogram abnormal    2009 moderate to severely dilated left atrium  . Erectile dysfunction   . Essential hypertension   . Gout   . History of chicken pox   . History of measles   . Hyperglycemia   . Hypogonadism in male   . Nocturia   . Peyronie's disease   . Traumatic tear of right rotator cuff 07/24/2014  . Venous stasis    chronic    Past Surgical History:  Procedure Laterality Date  . CARDIAC CATHETERIZATION  03/28/2004   normal coronaries, reduced EF at 25-30% (Dr. Gerrie Nordmann)  . CARDIOVERSION, TEE guided  03/29/2004   Dr. Gerrie Nordmann   . COLONOSCOPY  05/28/2013   Dr. Rayann Heman  . HERNIA REPAIR  age 10  . KNEE SURGERY    . LUMBAR FUSION  age 78  . MYOVIEW CARDIOVASCULAR STRESS TEST  07/2003   anterior wall thinning, LV systolic function depressed at 33%  . PILONIDAL CYST EXCISION    .  Right Eyebrow Tumor Removed  age 42   . SHOULDER ARTHROSCOPY WITH ROTATOR CUFF REPAIR AND SUBACROMIAL DECOMPRESSION Right 08/19/2014   Procedure: SHOULDER ARTHROSCOPY WITH ROTATOR CUFF REPAIR AND SUBACROMIAL DECOMPRESSION;  Surgeon: Lorn Junes, MD;  Location: [place];  Service: Orthopedics;  Laterality: Right;  . TOTAL HIP ARTHROPLASTY Right 03/25/2006   Dr. Wanda Plump. [place]  . TRANSTHORACIC ECHOCARDIOGRAM  08/2008   EF=>55%, mild-mod dilated; LA mod-severely dilated; RA mild-mod dilated; mild calcif of MV, borderline MVP, trace MR; trace TR; mild pulm valve regurg     Current Outpatient Medications  Medication Sig Dispense Refill  . benazepril (LOTENSIN) 20 MG tablet Take 1 tablet (20 mg  total) by mouth daily. 60 tablet 5  . carvedilol (COREG) 12.5 MG tablet Take 1 tablet (12.5 mg total) by mouth 2 (two) times daily. 180 tablet 1  . chlorthalidone (HYGROTON) 25 MG tablet TAKE 1 TABLET BY MOUTH ONCE DAILY 90 tablet 3  . Multiple Vitamin (MULTIVITAMIN) tablet Take 1 tablet by mouth daily.      . rivaroxaban (XARELTO) 20 MG TABS tablet Take 1 tablet (20 mg total) by mouth daily with supper. 90 tablet 1   No current facility-administered medications for this visit.     Allergies:   Patient has no known allergies.    Social History:  The patient  reports that he has never smoked. He has never used smokeless tobacco. He reports that he drinks about 1.2 - 1.8 oz of alcohol per week. He reports that he does not use drugs.   Family History:  The patient's family history includes Arthritis in his sister; Bladder Cancer in his father; Diabetes in his maternal uncle and paternal grandfather; Hypertension in his father and mother; Prostate cancer in his father; Stroke in his maternal grandfather; Supraventricular tachycardia in his brother and child.    ROS:  Please see the history of present illness. All other systems are reviewed and negative.    PHYSICAL EXAM: VS:  BP 140/90 (BP Location: Right Arm, Patient Position: Sitting)   Pulse 81   Ht 6\' 3"  (1.905 m)   Wt 239 lb 12.8 oz (108.8 kg)   BMI 29.97 kg/m  , BMI Body mass index is 29.97 kg/m. GEN: Well nourished, well developed, male in no acute distress  HEENT: normal for age  Neck: no JVD, no carotid bruit, no masses Cardiac: Irregular rate and rhythm; soft murmur, no rubs, or gallops Respiratory:  clear to auscultation bilaterally, normal work of breathing GI: soft, nontender, nondistended, + BS MS: no deformity or atrophy; no edema; distal pulses are 2+ in all 4 extremities   Skin: warm and dry, no rash Neuro:  Strength and sensation are intact.  However, he was not able to stand and balance himself on either foot.  He  also started to sway whenever he stood with both feet together.  He could feel himself getting off balance and was very alarmed by this. Psych: euthymic mood, full affect Orthostatic Vital Signs                               Blood Pressure                 HeartRate Lying  120/77 slightly dizzy 74  Sitting  129/90 68  Standing  128/82 79    EKG:  EKG is not ordered today.   Recent Labs: 12/31/2017: ALT 20; BUN  12; Creatinine, Ser 1.00; Potassium 4.1; Sodium 135    Lipid Panel    Component Value Date/Time   CHOL 161 12/31/2017 0946   TRIG 47 12/31/2017 0946   TRIG 65 01/06/2009   HDL 63 12/31/2017 0946   CHOLHDL 2.6 12/31/2017 0946   LDLCALC 89 12/31/2017 0946     Wt Readings from Last 3 Encounters:  05/06/18 239 lb 12.8 oz (108.8 kg)  12/31/17 243 lb (110.2 kg)  10/03/17 244 lb 3.2 oz (110.8 kg)     Other studies Reviewed: Additional studies/ records that were reviewed today include: Office notes and testing.  ASSESSMENT AND PLAN:  1.  Chronic atrial fibrillation: His heart rate is controlled, continue the carvedilol.  2.  Chronic anticoagulation: No obvious bleeding issues on the Xarelto, continue this.  He has not missed any doses.  Check a CBC  3.  Dizziness and balance problems: His problem seems to be a lack of proprioception.  According to the patient, he has been a long-term exerciser and this is a recent deterioration in his functional status. -I contacted [facility], they had a cancellation and Dr. Rexene Alberts is able to see him tomorrow morning.  He was given the information on the date and time of the appointment and its location. - As there is no mental status changes and no changes in level of consciousness, I will not do a monitor right now unless Dr. Ellyn Hack would prefer it. - It is possible that electrolyte imbalances are contributing to his balance issues, check a basic metabolic profile.  4.  Hypertension: His blood pressure was initially elevated.   However, orthostatics were checked and were negative with a systolic blood pressure generally in the 120s.  According to the patient, his blood pressure is generally controlled.   Current medicines are reviewed at length with the patient today.  The patient does not have concerns regarding medicines.  The following changes have been made:  no change  Labs/ tests ordered today include:   Orders Placed This Encounter  Procedures  . CBC  . Basic metabolic panel     Disposition:   FU with Dr. Ellyn Hack  Signed, Rosaria Ferries, PA-C  05/06/2018 4:48 PM    [place] Phone: [phone]; Fax: [phone]  This note was written with the assistance of speech recognition software. Please excuse any transcriptional errors.

## 2018-05-06 NOTE — Patient Instructions (Addendum)
Medication Instructions: Your physician recommends that you continue on your current medications as directed.    If you need a refill on your cardiac medications before your next appointment, please call your pharmacy.   Labwork: Your physician recommends that you return for lab work in: Today (CBC, BMP)   Procedures/Testing: None  Follow-Up: Your physician wants you to follow-up in 1 year with Dr. Ellyn Hack. You will receive a reminder letter in the mail two months in advance. If you don't receive a letter, please call our office at [phone] to schedule this follow-up appointment.   Special Instructions: Follow up with Dr. Rexene Alberts on 05/07/18 at 8 am   Thank you for choosing [facility] at [facility]!!

## 2018-05-07 ENCOUNTER — Ambulatory Visit: Payer: Medicare Other | Admitting: Neurology

## 2018-05-07 LAB — CBC
HEMOGLOBIN: 14.2 g/dL (ref 13.0–17.7)
Hematocrit: 41.1 % (ref 37.5–51.0)
MCH: 33.8 pg — AB (ref 26.6–33.0)
MCHC: 34.5 g/dL (ref 31.5–35.7)
MCV: 98 fL — ABNORMAL HIGH (ref 79–97)
Platelets: 343 10*3/uL (ref 150–450)
RBC: 4.2 x10E6/uL (ref 4.14–5.80)
RDW: 13.1 % (ref 12.3–15.4)
WBC: 6.2 10*3/uL (ref 3.4–10.8)

## 2018-05-07 LAB — BASIC METABOLIC PANEL
BUN/Creatinine Ratio: 14 (ref 10–24)
BUN: 12 mg/dL (ref 8–27)
CALCIUM: 10.2 mg/dL (ref 8.6–10.2)
CO2: 29 mmol/L (ref 20–29)
CREATININE: 0.87 mg/dL (ref 0.76–1.27)
Chloride: 93 mmol/L — ABNORMAL LOW (ref 96–106)
GFR, EST AFRICAN AMERICAN: 102 mL/min/{1.73_m2} (ref >59)
GFR, EST NON AFRICAN AMERICAN: 88 mL/min/{1.73_m2} (ref >59)
GLUCOSE: 111 mg/dL — AB (ref 65–99)
POTASSIUM: 4.4 mmol/L (ref 3.5–5.2)
Sodium: 138 mmol/L (ref 134–144)

## 2018-06-03 LAB — HM DIABETES EYE EXAM

## 2018-09-15 ENCOUNTER — Other Ambulatory Visit: Payer: Self-pay

## 2018-09-15 DIAGNOSIS — N401 Enlarged prostate with lower urinary tract symptoms: Principal | ICD-10-CM

## 2018-09-15 DIAGNOSIS — N138 Other obstructive and reflux uropathy: Secondary | ICD-10-CM

## 2018-09-16 ENCOUNTER — Other Ambulatory Visit: Payer: Medicare Other

## 2018-09-16 DIAGNOSIS — N401 Enlarged prostate with lower urinary tract symptoms: Principal | ICD-10-CM

## 2018-09-16 DIAGNOSIS — N138 Other obstructive and reflux uropathy: Secondary | ICD-10-CM

## 2018-09-17 LAB — PSA: Prostate Specific Ag, Serum: 0.5 ng/mL (ref 0.0–4.0)

## 2018-09-23 ENCOUNTER — Ambulatory Visit: Payer: Medicare Other | Admitting: Urology

## 2018-09-23 ENCOUNTER — Encounter: Payer: Self-pay | Admitting: Urology

## 2018-09-23 VITALS — BP 128/86 | HR 80 | Ht 75.0 in | Wt 244.5 lb

## 2018-09-23 DIAGNOSIS — N529 Male erectile dysfunction, unspecified: Secondary | ICD-10-CM | POA: Diagnosis not present

## 2018-09-23 DIAGNOSIS — N401 Enlarged prostate with lower urinary tract symptoms: Secondary | ICD-10-CM | POA: Diagnosis not present

## 2018-09-23 DIAGNOSIS — N138 Other obstructive and reflux uropathy: Secondary | ICD-10-CM | POA: Diagnosis not present

## 2018-09-23 NOTE — Progress Notes (Signed)
09/23/2018  10:28 AM   [name] November 02, 1945 [id number]  Referring provider: Birdie Sons, [address] [place], [place] [address]  Chief Complaint  Patient presents with  . Follow-up  . Benign Prostatic Hypertrophy   HPI: Patient is a 72 y.o. Caucasian male presenting for his annual exam. He has a history of erectile dysfunction (s/p penile prothesis placement on 12/21/2013) and BPH with LUTS.  Erectile dysfunction Penile prothesis in place and functioning properly.    BPH WITH LUTS His I-PSS score today is 8, which is borderline moderate lower urinary tract symptomatology. He is mostly satisfied with his quality life due to his urinary symptoms. His previous I-PSS score was 4/2. His symptoms are worsening (as per his I-PSS), although his outlook remains consistent.   His most recent PSA (09/16/2018) was 0.5, it is stable.  Today he reports feeling great. He denies any dysuria, hematuria or flank/suprapubic pain.  He also denies any recent fevers, chills, nausea or vomiting.  IPSS    Row Name 09/23/18 0900         International Prostate Symptom Score   How often have you had the sensation of not emptying your bladder?  Not at All     How often have you had to urinate less than every two hours?  Less than half the time     How often have you found you stopped and started again several times when you urinated?  Less than 1 in 5 times     How often have you found it difficult to postpone urination?  Less than 1 in 5 times     How often have you had a weak urinary stream?  Less than 1 in 5 times     How often have you had to strain to start urination?  Less than 1 in 5 times     How many times did you typically get up at night to urinate?  2 Times     Total IPSS Score  8       Quality of Life due to urinary symptoms   If you were to spend the rest of your life with your urinary condition just the way it is now how would you feel about that?  Mostly  Satisfied       PMH: Past Medical History:  Diagnosis Date  . BPH with obstruction/lower urinary tract symptoms   . Chronic atrial fibrillation   . Dislocation of shoulder, anterior, right, closed 07/24/2014  . Echocardiogram abnormal    2009 moderate to severely dilated left atrium  . Erectile dysfunction   . Essential hypertension   . Gout   . History of chicken pox   . History of measles   . Hyperglycemia   . Hypogonadism in male   . Nocturia   . Peyronie's disease   . Traumatic tear of right rotator cuff 07/24/2014  . Venous stasis    chronic   Surgical History: Past Surgical History:  Procedure Laterality Date  . CARDIAC CATHETERIZATION  03/28/2004   normal coronaries, reduced EF at 25-30% (Dr. Gerrie Nordmann)  . CARDIOVERSION, TEE guided  03/29/2004   Dr. Gerrie Nordmann   . COLONOSCOPY  05/28/2013   Dr. Rayann Heman  . HERNIA REPAIR  age 68  . KNEE SURGERY    . LUMBAR FUSION  age 63  . MYOVIEW CARDIOVASCULAR STRESS TEST  07/2003   anterior wall thinning, LV systolic function depressed at 33%  . PILONIDAL  CYST EXCISION    . Right Eyebrow Tumor Removed  age 60   . SHOULDER ARTHROSCOPY WITH ROTATOR CUFF REPAIR AND SUBACROMIAL DECOMPRESSION Right 08/19/2014   Procedure: SHOULDER ARTHROSCOPY WITH ROTATOR CUFF REPAIR AND SUBACROMIAL DECOMPRESSION;  Surgeon: Lorn Junes, MD;  Location: [place];  Service: Orthopedics;  Laterality: Right;  . TOTAL HIP ARTHROPLASTY Right 03/25/2006   Dr. Wanda Plump. [place]  . TRANSTHORACIC ECHOCARDIOGRAM  08/2008   EF=>55%, mild-mod dilated; LA mod-severely dilated; RA mild-mod dilated; mild calcif of MV, borderline MVP, trace MR; trace TR; mild pulm valve regurg    Home Medications:  Allergies as of 09/23/2018   No Known Allergies     Medication List       Accurate as of September 23, 2018 10:28 AM. Always use your most recent med list.        benazepril 20 MG tablet Commonly known as:  LOTENSIN Take 1 tablet (20 mg total) by mouth  daily.   carvedilol 12.5 MG tablet Commonly known as:  COREG Take 1 tablet (12.5 mg total) by mouth 2 (two) times daily.   chlorthalidone 25 MG tablet Commonly known as:  HYGROTON TAKE 1 TABLET BY MOUTH ONCE DAILY   multivitamin tablet Take 1 tablet by mouth daily.   rivaroxaban 20 MG Tabs tablet Commonly known as:  XARELTO Take 1 tablet (20 mg total) by mouth daily with supper.      Allergies: No Known Allergies  Family History: Family History  Problem Relation Age of Onset  . Hypertension Father   . Prostate cancer Father   . Bladder Cancer Father   . Hypertension Mother   . Stroke Maternal Grandfather   . Diabetes Paternal Grandfather   . Supraventricular tachycardia Brother   . Arthritis Sister        RA  . Diabetes Maternal Uncle        TYPE 2  . Supraventricular tachycardia Child   . Kidney disease Neg Hx   . Heart attack Neg Hx   . Colon cancer Neg Hx    Social History:  reports that he has never smoked. He has never used smokeless tobacco. He reports current alcohol use of about 2.0 - 3.0 standard drinks of alcohol per week. He reports that he does not use drugs.  ROS: UROLOGY Frequent Urination?: No Hard to postpone urination?: No Burning/pain with urination?: No Get up at night to urinate?: No Leakage of urine?: No Urine stream starts and stops?: No Trouble starting stream?: No Do you have to strain to urinate?: No Blood in urine?: No Urinary tract infection?: No Sexually transmitted disease?: No Injury to kidneys or bladder?: No Painful intercourse?: No Weak stream?: No Erection problems?: No Penile pain?: No  Gastrointestinal Nausea?: No Vomiting?: No Indigestion/heartburn?: No Diarrhea?: No Constipation?: No  Constitutional Fever: No Night sweats?: No Weight loss?: No Fatigue?: No  Skin Skin rash/lesions?: No Itching?: No  Eyes Blurred vision?: No Double vision?: No  Ears/Nose/Throat Sore throat?: No Sinus problems?:  No  Hematologic/Lymphatic Swollen glands?: No Easy bruising?: No  Cardiovascular Leg swelling?: No Chest pain?: No  Respiratory Cough?: No Shortness of breath?: No  Endocrine Excessive thirst?: No  Musculoskeletal Back pain?: No Joint pain?: No  Neurological Headaches?: No Dizziness?: No  Psychologic Depression?: No Anxiety?: No  Physical Exam: BP 128/86 (BP Location: Left Arm, Patient Position: Sitting, Cuff Size: Normal)   Pulse 80   Ht 6\' 3"  (1.905 m)   Wt 244 lb 8 oz (  110.9 kg)   BMI 30.56 kg/m   Constitutional: Well nourished. Alert and oriented, No acute distress. Respiratory: Normal respiratory effort, no increased work of breathing.  GU: No CVA tenderness.  No bladder fullness or masses.  Patient with circumcised phallus.  Urethral meatus is patent.  No penile discharge. No penile lesions or rashes. Cylinders are located in the penis. Scrotum without lesions, cysts, rashes and/or edema.  Testicles are located scrotally bilaterally. No masses are appreciated in the testicles. Left and right epididymis are normal.  Pump in place.  Rectal: Patient with  normal sphincter tone. Anus and perineum without scarring or rashes. No rectal masses are appreciated. Prostate is approximately 45 grams, no nodules are appreciated. Seminal vesicles are normal. Skin: No rashes, bruises or suspicious lesions. Neurologic: Grossly intact, no focal deficits, moving all 4 extremities. Psychiatric: Normal mood and affect.  Laboratory Data: Lab Results  Component Value Date   WBC 6.2 05/06/2018   HGB 14.2 05/06/2018   HCT 41.1 05/06/2018   MCV 98 (H) 05/06/2018   PLT 343 05/06/2018   Lab Results  Component Value Date   CREATININE 0.87 05/06/2018   PSA History  0.7 ng/mL on 09/21/2013  0.5 ng/mL on 02/11/2014  0.7 ng/mL on 09/24/2014  0.6 ng/mL on 09/23/2015  0.7 ng/mL on 09/20/2016  0.6 ng/mL on 09/18/2017  0.5 ng/mL on 09/16/2018  I have reviewed the  labs.  Assessment & Plan:   1. BPH with LUTS  - PSA is 0.5, it is stable  - IPSS score is 18/2, it is slightly worsening although patient maintains a positive outlook on his life quality due to his symptoms.  - Continue conservative management, avoiding bladder irritants and timed voiding's  - RTC in 12 months for IPSS, PSA and exam   Return in about 1 year (around 09/24/2019) for IPSS, PSA and exam.  Laneta Simmers  [address] [place], [place] [address] [phone]  I, Temidayo Atanda-Ogunleye , am acting as a Education administrator for Nori Riis, PA-C  I have reviewed the above documentation for accuracy and completeness, and I agree with the above.    Zara Council, PA-C

## 2018-10-06 ENCOUNTER — Encounter: Payer: Self-pay | Admitting: Family Medicine

## 2018-10-06 ENCOUNTER — Ambulatory Visit: Payer: Self-pay

## 2018-10-06 ENCOUNTER — Ambulatory Visit (INDEPENDENT_AMBULATORY_CARE_PROVIDER_SITE_OTHER): Payer: Medicare Other | Admitting: Family Medicine

## 2018-10-06 ENCOUNTER — Telehealth: Payer: Self-pay | Admitting: Family Medicine

## 2018-10-06 VITALS — BP 126/86 | HR 74 | Temp 98.5°F | Resp 16 | Ht 75.0 in | Wt 245.0 lb

## 2018-10-06 DIAGNOSIS — K760 Fatty (change of) liver, not elsewhere classified: Secondary | ICD-10-CM

## 2018-10-06 DIAGNOSIS — I739 Peripheral vascular disease, unspecified: Secondary | ICD-10-CM

## 2018-10-06 DIAGNOSIS — I10 Essential (primary) hypertension: Secondary | ICD-10-CM

## 2018-10-06 DIAGNOSIS — R739 Hyperglycemia, unspecified: Secondary | ICD-10-CM | POA: Diagnosis not present

## 2018-10-06 DIAGNOSIS — N281 Cyst of kidney, acquired: Secondary | ICD-10-CM

## 2018-10-06 DIAGNOSIS — N400 Enlarged prostate without lower urinary tract symptoms: Secondary | ICD-10-CM

## 2018-10-06 DIAGNOSIS — I482 Chronic atrial fibrillation, unspecified: Secondary | ICD-10-CM

## 2018-10-06 DIAGNOSIS — Z0000 Encounter for general adult medical examination without abnormal findings: Secondary | ICD-10-CM

## 2018-10-06 DIAGNOSIS — N401 Enlarged prostate with lower urinary tract symptoms: Secondary | ICD-10-CM

## 2018-10-06 DIAGNOSIS — N138 Other obstructive and reflux uropathy: Secondary | ICD-10-CM

## 2018-10-06 DIAGNOSIS — Z6830 Body mass index (BMI) 30.0-30.9, adult: Secondary | ICD-10-CM

## 2018-10-06 MED ORDER — AMOXICILLIN 500 MG PO CAPS: 500.0000 mg | ORAL_CAPSULE | Freq: Two times a day (BID) | ORAL | 0 refills | Status: AC

## 2018-10-06 MED ORDER — AMOXICILLIN 500 MG PO CAPS: 500.0000 mg | ORAL_CAPSULE | Freq: Two times a day (BID) | ORAL | 0 refills | Status: DC

## 2018-10-06 NOTE — Patient Instructions (Addendum)
.   Please bring all of your medications to every appointment so we can make sure that our medication list is the same as yours.   . Please contact your eyecare professional to schedule a routine eye exam. I recommend seeing Dr. Jomarie Longs at [phone] or the [facility] at [phone]   The CDC recommends two doses of Shingrix (the shingles vaccine) separated by 2 to 6 months for adults age 70 years and older. I recommend checking with your insurance plan regarding coverage for this vaccine.    You are due for a follow up ultrasound to check on the cyst in your left kidney. Let me know if we need to schedule this for you

## 2018-10-06 NOTE — Telephone Encounter (Signed)
I left a message letting the patient know that Alyson Ingles is out sick, but he should still come in at 9:00 to have his physical with Dr. Caryn Section. VDM (DD)

## 2018-10-06 NOTE — Progress Notes (Signed)
Patient: [name], Male    DOB: May 05, 1946, 72 y.o.   MRN: [id number] Visit Date: 10/06/2018  Today's Provider: [name], MD   Chief Complaint  Patient presents with  . Medicare Wellness  . Hypertension   Subjective:    Annual wellness visit [name] is a 72 y.o. male. He feels fairly well. He reports exercising 3 times a week. He reports he is sleeping fairly well.  -----------------------------------------------------------  Hypertension, follow-up:  BP Readings from Last 3 Encounters:  10/06/18 126/86  09/23/18 128/86  05/06/18 140/90    He was last seen for hypertension 1 years ago.  BP at that visit was 120/84. Management since that visit includes no changes. He reports good compliance with treatment. He is not having side effects.  He is exercising. He is adherent to low salt diet.   Outside blood pressures are not being checked. He is experiencing none.  Patient denies chest pain, chest pressure/discomfort, claudication, dyspnea, exertional chest pressure/discomfort, irregular heart beat, lower extremity edema, near-syncope, orthopnea, palpitations, paroxysmal nocturnal dyspnea, syncope and tachypnea.   Cardiovascular risk factors include advanced age (older than 55 for men, 33 for women), hypertension and male gender.  Use of agents associated with hypertension: none.     Weight trend: stable Wt Readings from Last 3 Encounters:  10/06/18 245 lb (111.1 kg)  09/23/18 244 lb 8 oz (110.9 kg)  05/06/18 239 lb 12.8 oz (108.8 kg)    Current diet: well balanced  ------------------------------------------------------------------------     Social History   Socioeconomic History  . Marital status: Single    Spouse name: Not on file  . Number of children: 1  . Years of education: Not on file  . Highest education level: Master's degree (e.g., MA, MS, MEng, MEd, MSW, MBA)  Occupational History  . Occupation: retired Pharmacist, hospital, taught  PE and coached  Social Needs  . Financial resource strain: Not hard at all  . Food insecurity:    Worry: Never true    Inability: Never true  . Transportation needs:    Medical: No    Non-medical: No  Tobacco Use  . Smoking status: Never Smoker  . Smokeless tobacco: Never Used  Substance and Sexual Activity  . Alcohol use: Yes    Alcohol/week: 0.0 standard drinks    Comment: 4 or more per week  . Drug use: No  . Sexual activity: Not on file  Lifestyle  . Physical activity:    Days per week: Not on file    Minutes per session: Not on file  . Stress: Not at all  Relationships  . Social connections:    Talks on phone: Not on file    Gets together: Not on file    Attends religious service: Not on file    Active member of club or organization: Not on file    Attends meetings of clubs or organizations: Not on file    Relationship status: Not on file  . Intimate partner violence:    Fear of current or ex partner: No    Emotionally abused: No    Physically abused: No    Forced sexual activity: No  Other Topics Concern  . Not on file  Social History Narrative   Divorced father of 1. Retired Education officer, museum.   ~2 beers / day. Does not smoke.   Works out 6 days a weeks - cardio & weights. He used to run and play basketball prior  to his hip surgery in 2007.   Currently dating.    Past Medical History:  Diagnosis Date  . BPH with obstruction/lower urinary tract symptoms   . Chronic atrial fibrillation   . Dislocation of shoulder, anterior, right, closed 07/24/2014  . Echocardiogram abnormal    2009 moderate to severely dilated left atrium  . Erectile dysfunction   . Essential hypertension   . Gout   . History of chicken pox   . History of measles   . Hyperglycemia   . Hypogonadism in male   . Nocturia   . Peyronie's disease   . Traumatic tear of right rotator cuff 07/24/2014  . Venous stasis    chronic     Patient Active Problem List   Diagnosis Date Noted  .  Complex renal cyst, left 04/22/2017  . Fatty liver 04/22/2017  . NICM (nonischemic cardiomyopathy) ([place]) 08/21/2016  . Exercise intolerance 12/09/2015  . BPH with obstruction/lower urinary tract symptoms 09/27/2015  . Erectile dysfunction of organic origin 09/27/2015  . Arthralgia of right hand 09/23/2015  . Basal cell carcinoma of right lower extremity 03/24/2015  . Hyperglycemia 08/16/2014  . Dislocation of shoulder, anterior, right, closed 07/24/2014  . Traumatic tear of right rotator cuff 07/24/2014  . Venous stasis dermatitis 11/02/2013  . Long term current use of anticoagulant therapy 12/24/2012  . Benign prostatic hypertrophy without urinary obstruction 03/25/2010  . Gouty arthropathy 03/24/2010  . Peripheral vascular disease ([place]) 12/19/2009  . Essential hypertension 11/18/2009  . Chronic atrial fibrillation (HCC) - CHA2DS2-VASc Score 3; On Xarelto 11/18/2009    Class: Chronic    Past Surgical History:  Procedure Laterality Date  . CARDIAC CATHETERIZATION  03/28/2004   normal coronaries, reduced EF at 25-30% (Dr. Gerrie Nordmann)  . CARDIOVERSION, TEE guided  03/29/2004   Dr. Gerrie Nordmann   . COLONOSCOPY  05/28/2013   Dr. Rayann Heman  . HERNIA REPAIR  age 51  . KNEE SURGERY    . LUMBAR FUSION  age 69  . MYOVIEW CARDIOVASCULAR STRESS TEST  07/2003   anterior wall thinning, LV systolic function depressed at 33%  . PILONIDAL CYST EXCISION    . Right Eyebrow Tumor Removed  age 70   . SHOULDER ARTHROSCOPY WITH ROTATOR CUFF REPAIR AND SUBACROMIAL DECOMPRESSION Right 08/19/2014   Procedure: SHOULDER ARTHROSCOPY WITH ROTATOR CUFF REPAIR AND SUBACROMIAL DECOMPRESSION;  Surgeon: Lorn Junes, MD;  Location: [facility];  Service: Orthopedics;  Laterality: Right;  . TOTAL HIP ARTHROPLASTY Right 03/25/2006   Dr. Wanda Plump. [place]uis[place]  . TRANSTHORACIC ECHOCARDIOGRAM  08/2008   EF=>55%, mild-mod dilated; LA mod-severely dilated; RA mild-mod dilated; mild calcif of MV, borderline MVP, trace  MR; trace TR; mild pulm valve regurg     His family history includes Arthritis in his sister; Bladder Cancer in his father; Diabetes in his maternal uncle and paternal grandfather; Hypertension in his father and mother; Prostate cancer in his father; Stroke in his maternal grandfather; Supraventricular tachycardia in his brother and child. There is no history of Kidney disease, Heart attack, or Colon cancer.      Current Outpatient Medications:  .  benazepril (LOTENSIN) 20 MG tablet, Take 1 tablet (20 mg total) by mouth daily., Disp: 60 tablet, Rfl: 5 .  carvedilol (COREG) 12.5 MG tablet, Take 1 tablet (12.5 mg total) by mouth 2 (two) times daily., Disp: 180 tablet, Rfl: 1 .  chlorthalidone (HYGROTON) 25 MG tablet, TAKE 1 TABLET BY MOUTH ONCE DAILY, Disp: 90 tablet, Rfl: 3 .  Multiple Vitamin (MULTIVITAMIN) tablet, Take 1 tablet by mouth daily.  , Disp: , Rfl:  .  rivaroxaban (XARELTO) 20 MG TABS tablet, Take 1 tablet (20 mg total) by mouth daily with supper., Disp: 90 tablet, Rfl: 1  Patient Care Team: Birdie Sons, MD as PCP - General (Family Medicine) Leonie Man, MD as Consulting Physician (Cardiology) Dasher, Rayvon Char, MD (Dermatology) Nori Riis, PA-C as Physician Assistant (Urology)     Objective:   Vitals: BP 126/86 (BP Location: Left Arm, Patient Position: Sitting, Cuff Size: Large)   Pulse 74   Temp 98.5 F (36.9 C) (Oral)   Resp 16   Ht 6\' 3"  (1.905 m)   Wt 245 lb (111.1 kg)   SpO2 97% Comment: room air  BMI 30.62 kg/m     Activities of Daily Living In your present state of health, do you have any difficulty performing the following activities: 10/06/2018  Hearing? N  Vision? N  Difficulty concentrating or making decisions? N  Walking or climbing stairs? Y  Dressing or bathing? N  Doing errands, shopping? N  Some recent data might be hidden    Fall Risk Assessment Fall Risk  10/06/2018 10/03/2017 09/26/2016 09/26/2015  Falls in the past year?  1 No No Yes  Number falls in past yr: 1 - - 1  Injury with Fall? 0 - - No  Follow up Falls prevention discussed - - Falls evaluation completed     Depression Screen PHQ 2/9 Scores 10/06/2018 10/03/2017 10/03/2017 09/26/2016  PHQ - 2 Score 0 0 0 0  PHQ- 9 Score 0 0 - -    No flowsheet data found.   Cognitive Testing - 6-CIT  Correct? Score   What year is it? yes 0 0 or 4  What month is it? yes 0 0 or 3  Memorize:    Pia Mau,  44,  [address]  [place],      What time is it? (within 1 hour) yes 0 0 or 3  Count backwards from 20 yes 0 0, 2, or 4  Name the months of the year yes 0 0, 2, or 4  Repeat name & address above no 3 0, 2, 4, 6, 8, or 10       TOTAL SCORE  3/28   Interpretation:  Normal  Normal (0-7) Abnormal (8-28)   Assessment & Plan:     Annual Wellness Visit  Reviewed patient's Family Medical History Reviewed and updated list of patient's medical providers Assessment of cognitive impairment was done Assessed patient's functional ability Established a written schedule for health screening [place] Completed and Reviewed  Exercise Activities and Dietary recommendations Goals    . DIET - INCREASE WATER INTAKE     Recommend increasing water intake to 3-4 glasses a day.        Immunization History  Administered Date(s) Administered  . Influenza-Unspecified 06/25/2017, 06/22/2018  . Pneumococcal Conjugate-13 09/20/2014  . Pneumococcal Polysaccharide-23 09/26/2015  . Tdap 03/27/2011  . Zoster 03/24/2010    Health Maintenance  Topic Date Due  . INFLUENZA VACCINE  05/08/2018  . TETANUS/TDAP  03/26/2021  . COLONOSCOPY  05/29/2023  . Hepatitis C Screening  Completed  . PNA vac Low Risk Adult  Completed     Discussed health benefits of physical activity, and encouraged him to engage in regular exercise appropriate for his age and condition.      ------------------------------------------------------------------------------------------------------------    [name], MD  [facility]

## 2018-10-06 NOTE — Progress Notes (Signed)
Patient: [name], Male    DOB: 03/01/1946, 72 y.o.   MRN: [id number] Visit Date: 10/06/2018  Today's Provider: [name], MD   Chief Complaint  Patient presents with  . Medicare Wellness  . Annual Exam  . Hypertension   Subjective:    Annual physical exam [name] is a 72 y.o. male who presents today for health maintenance and complete physical. He feels well. He reports exercising occasionally. He reports he is sleeping fairly well. His only complaint today is bilateral knee pain. He did have a sore throat yesterday, which is better today.  -----------------------------------------------------------------   Review of Systems  Constitutional: Negative for chills, diaphoresis and fever.  HENT: Negative for congestion, ear discharge, ear pain, hearing loss, nosebleeds, sore throat and tinnitus.   Eyes: Negative for photophobia, pain, discharge and redness.  Respiratory: Negative for cough, shortness of breath, wheezing and stridor.   Cardiovascular: Negative for chest pain, palpitations and leg swelling.  Gastrointestinal: Negative for abdominal pain, blood in stool, constipation, diarrhea, nausea and vomiting.  Endocrine: Negative for polydipsia.  Genitourinary: Negative for dysuria, flank pain, frequency, hematuria and urgency.  Musculoskeletal: Positive for arthralgias. Negative for back pain, myalgias and neck pain.  Skin: Negative for rash.  Allergic/Immunologic: Negative for environmental allergies.  Neurological: Negative for dizziness, tremors, seizures, weakness and headaches.  Hematological: Does not bruise/bleed easily.  Psychiatric/Behavioral: Negative for hallucinations and suicidal ideas. The patient is not nervous/anxious.     Social History      He  reports that he has never smoked. He has never used smokeless tobacco. He reports current alcohol use. He reports that he does not use drugs.       Social History   Socioeconomic History   . Marital status: Single    Spouse name: Not on file  . Number of children: 1  . Years of education: Not on file  . Highest education level: Master's degree (e.g., MA, MS, MEng, MEd, MSW, MBA)  Occupational History  . Occupation: retired Pharmacist, hospital, taught PE and coached  Social Needs  . Financial resource strain: Not hard at all  . Food insecurity:    Worry: Never true    Inability: Never true  . Transportation needs:    Medical: No    Non-medical: No  Tobacco Use  . Smoking status: Never Smoker  . Smokeless tobacco: Never Used  Substance and Sexual Activity  . Alcohol use: Yes    Alcohol/week: 0.0 standard drinks    Comment: 4 or more per week  . Drug use: No  . Sexual activity: Not on file  Lifestyle  . Physical activity:    Days per week: Not on file    Minutes per session: Not on file  . Stress: Not at all  Relationships  . Social connections:    Talks on phone: Not on file    Gets together: Not on file    Attends religious service: Not on file    Active member of club or organization: Not on file    Attends meetings of clubs or organizations: Not on file    Relationship status: Not on file  Other Topics Concern  . Not on file  Social History Narrative   Divorced father of 1. Retired Education officer, museum.   ~2 beers / day. Does not smoke.   Works out 6 days a weeks - cardio & weights. He used to run and play basketball prior to his  hip surgery in 2007.   Currently dating.    Past Medical History:  Diagnosis Date  . BPH with obstruction/lower urinary tract symptoms   . Chronic atrial fibrillation   . Dislocation of shoulder, anterior, right, closed 07/24/2014  . Echocardiogram abnormal    2009 moderate to severely dilated left atrium  . Erectile dysfunction   . Essential hypertension   . Gout   . History of chicken pox   . History of measles   . Hyperglycemia   . Hypogonadism in male   . Nocturia   . Peyronie's disease   . Traumatic tear of right rotator  cuff 07/24/2014  . Venous stasis    chronic     Patient Active Problem List   Diagnosis Date Noted  . Complex renal cyst, left 04/22/2017  . Fatty liver 04/22/2017  . NICM (nonischemic cardiomyopathy) ([place]) 08/21/2016  . Exercise intolerance 12/09/2015  . BPH with obstruction/lower urinary tract symptoms 09/27/2015  . Erectile dysfunction of organic origin 09/27/2015  . Arthralgia of right hand 09/23/2015  . Basal cell carcinoma of right lower extremity 03/24/2015  . Hyperglycemia 08/16/2014  . Dislocation of shoulder, anterior, right, closed 07/24/2014  . Traumatic tear of right rotator cuff 07/24/2014  . Venous stasis dermatitis 11/02/2013  . Long term current use of anticoagulant therapy 12/24/2012  . Gouty arthropathy 03/24/2010  . Peripheral vascular disease ([place]) 12/19/2009  . Essential hypertension 11/18/2009  . Chronic atrial fibrillation (HCC) - CHA2DS2-VASc Score 3; On Xarelto 11/18/2009    Class: Chronic    Past Surgical History:  Procedure Laterality Date  . CARDIAC CATHETERIZATION  03/28/2004   normal coronaries, reduced EF at 25-30% (Dr. Gerrie Nordmann)  . CARDIOVERSION, TEE guided  03/29/2004   Dr. Gerrie Nordmann   . COLONOSCOPY  05/28/2013   Dr. Rayann Heman  . HERNIA REPAIR  age 75  . KNEE SURGERY    . LUMBAR FUSION  age 77  . MYOVIEW CARDIOVASCULAR STRESS TEST  07/2003   anterior wall thinning, LV systolic function depressed at 33%  . PILONIDAL CYST EXCISION    . Right Eyebrow Tumor Removed  age 85   . SHOULDER ARTHROSCOPY WITH ROTATOR CUFF REPAIR AND SUBACROMIAL DECOMPRESSION Right 08/19/2014   Procedure: SHOULDER ARTHROSCOPY WITH ROTATOR CUFF REPAIR AND SUBACROMIAL DECOMPRESSION;  Surgeon: Lorn Junes, MD;  Location: [facility];  Service: Orthopedics;  Laterality: Right;  . TOTAL HIP ARTHROPLASTY Right 03/25/2006   Dr. Wanda Plump. [place]  . TRANSTHORACIC ECHOCARDIOGRAM  08/2008   EF=>55%, mild-mod dilated; LA mod-severely dilated; RA mild-mod dilated; mild  calcif of MV, borderline MVP, trace MR; trace TR; mild pulm valve regurg     Family History        Family Status  Relation Name Status  . Father  Deceased at age 68  . Mother  Alive       has back problems, borderline Diabetic  . MGF  Deceased at age 67       stroke at 23  . PGF  Deceased at age 35       diabetes  . Brother  Alive  . MGM  Deceased at age 56  . PGM  Deceased at age 90  . Sister  Alive  . Mat Uncle  Deceased  . Brother  Alive  . Sister  Alive  . Child  (Not Specified)  . Neg Hx  (Not Specified)        His family history includes Arthritis in his sister;  Bladder Cancer in his father; Diabetes in his maternal uncle and paternal grandfather; Hypertension in his father and mother; Prostate cancer in his father; Stroke in his maternal grandfather; Supraventricular tachycardia in his brother and child. There is no history of Kidney disease, Heart attack, or Colon cancer.      No Known Allergies   Current Outpatient Medications:  .  benazepril (LOTENSIN) 20 MG tablet, Take 1 tablet (20 mg total) by mouth daily., Disp: 60 tablet, Rfl: 5 .  carvedilol (COREG) 12.5 MG tablet, Take 1 tablet (12.5 mg total) by mouth 2 (two) times daily., Disp: 180 tablet, Rfl: 1 .  chlorthalidone (HYGROTON) 25 MG tablet, TAKE 1 TABLET BY MOUTH ONCE DAILY, Disp: 90 tablet, Rfl: 3 .  Multiple Vitamin (MULTIVITAMIN) tablet, Take 1 tablet by mouth daily.  , Disp: , Rfl:  .  rivaroxaban (XARELTO) 20 MG TABS tablet, Take 1 tablet (20 mg total) by mouth daily with supper., Disp: 90 tablet, Rfl: 1 .  amoxicillin (AMOXIL) 500 MG capsule, Take 1 capsule (500 mg total) by mouth 2 (two) times daily for 7 days., Disp: 14 capsule, Rfl: 0   Patient Care Team: Birdie Sons, MD as PCP - General (Family Medicine) Leonie Man, MD as Consulting Physician (Cardiology) Dasher, Rayvon Char, MD (Dermatology) Nori Riis, PA-C as Physician Assistant (Urology)      Objective:   Vitals: BP 126/86  (BP Location: Left Arm, Patient Position: Sitting, Cuff Size: Large)   Pulse 74   Temp 98.5 F (36.9 C) (Oral)   Resp 16   Ht 6\' 3"  (1.905 m)   Wt 245 lb (111.1 kg)   SpO2 97% Comment: room air  BMI 30.62 kg/m    Vitals:   10/06/18 0902  BP: 126/86  Pulse: 74  Resp: 16  Temp: 98.5 F (36.9 C)  TempSrc: Oral  SpO2: 97%  Weight: 245 lb (111.1 kg)  Height: 6\' 3"  (1.905 m)     Physical Exam   General Appearance:    Alert, cooperative, no distress, appears stated age, obese  Head:    Normocephalic, without obvious abnormality, atraumatic  Eyes:    PERRL, conjunctiva/corneas clear, EOM's intact, fundi    benign, both eyes       Ears:    Normal TM's and external ear canals, both ears  Nose:   Nares normal, septum midline, mucosa normal, no drainage   or sinus tenderness  Throat:   Lips, mucosa, and tongue normal; teeth and gums normal  Neck:   Supple, symmetrical, trachea midline, no adenopathy;       thyroid:  No enlargement/tenderness/nodules; no carotid   bruit or JVD  Back:     Symmetric, no curvature, ROM normal, no CVA tenderness  Lungs:     Clear to auscultation bilaterally, respirations unlabored  Chest wall:    No tenderness or deformity  Heart:     Irregularly irregular rhythm. Normal rate  S1 and S2 normal, no murmur, rub   or gallop  Abdomen:     Soft, non-tender, bowel sounds active all four quadrants,    no masses, no organomegaly  Genitalia:    deferred  Rectal:    deferred  Extremities:   Extremities normal, atraumatic, no cyanosis or edema  Pulses:   2+ and symmetric all extremities  Skin:   Skin color, texture, turgor normal, no rashes or lesions  Lymph nodes:   Cervical, supraclavicular, and axillary nodes normal  Neurologic:   CNII-XII intact.  Normal strength, sensation and reflexes      throughout   Depression Screen PHQ 2/9 Scores 10/06/2018 10/03/2017 10/03/2017 09/26/2016  PHQ - 2 Score 0 0 0 0  PHQ- 9 Score 0 0 - -      Assessment &  Plan:     Routine Health Maintenance and Physical Exam  Exercise Activities and Dietary recommendations Goals    . DIET - INCREASE WATER INTAKE     Recommend increasing water intake to 3-4 glasses a day.        Immunization History  Administered Date(s) Administered  . Influenza-Unspecified 06/25/2017, 06/22/2018  . Pneumococcal Conjugate-13 09/20/2014  . Pneumococcal Polysaccharide-23 09/26/2015  . Tdap 03/27/2011  . Zoster 03/24/2010    Health Maintenance  Topic Date Due  . Samul Dada  03/26/2021  . COLONOSCOPY  05/29/2023  . INFLUENZA VACCINE  Completed  . Hepatitis C Screening  Completed  . PNA vac Low Risk Adult  Completed     Discussed health benefits of physical activity, and encouraged him to engage in regular exercise appropriate for his age and condition.    --------------------------------------------------------------------  1. Annual physical exam Obese, otherwise normal exam.   2. Complex renal cyst, left Advised he is due for follow up renal ultrasound. He states he has a follow up with Gi next month and anticipates they are going to be ordering follow up ultrasound.   3. Fatty liver  4. Hyperglycemia - Hemoglobin A1c  5. BPH with obstruction/lower urinary tract symptoms  Continue regular follow up with urology.  6. BMI 30.0-30.9,adult Counseled regarding prudent diet and regular exercise.    7. Peripheral vascular disease ([place]) Continue yearly follow up vascular.  - Comprehensive metabolic panel - Lipid panel - CBC  8. Essential hypertension Well controlled.  Continue current medications.   - Comprehensive metabolic panel - Lipid panel - CBC  9. Chronic atrial fibrillation (HCC) - CHA2DS2-VASc Score 3; On Xarelto Continue routine follow up Dr. Ellyn Hack. Rate well controlled.   He has history of recurrent sinus infections, but has mild uri now. Sent prescription for amoxicillin to keep on hand in case he develops any signs of  bacterial infection.   [name], MD  [facility]

## 2018-10-07 ENCOUNTER — Telehealth: Payer: Self-pay

## 2018-10-07 LAB — CBC
Hematocrit: 40.4 % (ref 37.5–51.0)
Hemoglobin: 14.3 g/dL (ref 13.0–17.7)
MCH: 33.2 pg — ABNORMAL HIGH (ref 26.6–33.0)
MCHC: 35.4 g/dL (ref 31.5–35.7)
MCV: 94 fL (ref 79–97)
Platelets: 300 10*3/uL (ref 150–450)
RBC: 4.31 x10E6/uL (ref 4.14–5.80)
RDW: 11.9 % — ABNORMAL LOW (ref 12.3–15.4)
WBC: 6.8 10*3/uL (ref 3.4–10.8)

## 2018-10-07 LAB — COMPREHENSIVE METABOLIC PANEL
ALBUMIN: 4.7 g/dL (ref 3.5–4.8)
ALK PHOS: 81 IU/L (ref 39–117)
ALT: 24 IU/L (ref 0–44)
AST: 24 IU/L (ref 0–40)
Albumin/Globulin Ratio: 1.9 (ref 1.2–2.2)
BUN/Creatinine Ratio: 14 (ref 10–24)
BUN: 14 mg/dL (ref 8–27)
Bilirubin Total: 0.9 mg/dL (ref 0.0–1.2)
CALCIUM: 10.6 mg/dL — AB (ref 8.6–10.2)
CO2: 29 mmol/L (ref 20–29)
CREATININE: 0.97 mg/dL (ref 0.76–1.27)
Chloride: 95 mmol/L — ABNORMAL LOW (ref 96–106)
GFR, EST AFRICAN AMERICAN: 91 mL/min/{1.73_m2} (ref >59)
GFR, EST NON AFRICAN AMERICAN: 79 mL/min/{1.73_m2} (ref >59)
GLOBULIN, TOTAL: 2.5 g/dL (ref 1.5–4.5)
GLUCOSE: 112 mg/dL — AB (ref 65–99)
Potassium: 4.2 mmol/L (ref 3.5–5.2)
SODIUM: 139 mmol/L (ref 134–144)
TOTAL PROTEIN: 7.2 g/dL (ref 6.0–8.5)

## 2018-10-07 LAB — HEMOGLOBIN A1C
Est. average glucose Bld gHb Est-mCnc: 114 mg/dL
Hgb A1c MFr Bld: 5.6 % (ref 4.8–5.6)

## 2018-10-07 LAB — LIPID PANEL
Chol/HDL Ratio: 2.4 ratio (ref 0.0–5.0)
Cholesterol, Total: 166 mg/dL (ref 100–199)
HDL: 68 mg/dL (ref >39)
LDL Calculated: 78 mg/dL (ref 0–99)
Triglycerides: 98 mg/dL (ref 0–149)
VLDL CHOLESTEROL CAL: 20 mg/dL (ref 5–40)

## 2018-10-07 NOTE — Telephone Encounter (Signed)
Tried calling patient. Left message to call back. 

## 2018-10-07 NOTE — Telephone Encounter (Signed)
-----   Message from Birdie Sons, MD sent at 10/07/2018  9:27 AM EST ----- Cholesterol is very good at 166, rest of labs including blood sugar, kidney functions, liver functions and blood cell count are all normal.

## 2018-10-09 NOTE — Telephone Encounter (Signed)
Patient advised.

## 2018-11-26 ENCOUNTER — Encounter: Payer: Self-pay | Admitting: Family Medicine

## 2018-11-27 ENCOUNTER — Other Ambulatory Visit: Payer: Self-pay | Admitting: Cardiology

## 2018-12-25 ENCOUNTER — Telehealth: Payer: Self-pay | Admitting: *Deleted

## 2018-12-25 NOTE — Telephone Encounter (Signed)
   Primary Cardiologist:  No primary care provider on file.   Patient contacted.  History reviewed.  No symptoms to suggest any unstable cardiac conditions.  Based on discussion, with current pandemic situation, we will be postponing this appointment for @PATIENTNAME @.  If symptoms change, he has been instructed to contact our office.   Routing to C19 CANCEL pool for tracking (P CV DIV CV19 CANCEL) and assigning priority (1 = 4-6 wks, 2 = 6-12 wks, 3 = >12 wks).  Raiford Simmonds, RN  12/25/2018 4:24 PM         .

## 2018-12-31 ENCOUNTER — Ambulatory Visit: Payer: Medicare Other | Admitting: Cardiology

## 2019-01-08 ENCOUNTER — Other Ambulatory Visit: Payer: Self-pay | Admitting: Cardiology

## 2019-01-08 DIAGNOSIS — I10 Essential (primary) hypertension: Secondary | ICD-10-CM

## 2019-01-17 ENCOUNTER — Other Ambulatory Visit: Payer: Self-pay | Admitting: Cardiology

## 2019-01-27 ENCOUNTER — Other Ambulatory Visit: Payer: Self-pay | Admitting: Cardiology

## 2019-01-27 NOTE — Telephone Encounter (Signed)
Chlorthalidone refilled. Message sent to both pharmacy and scheduling appointment needed.

## 2019-02-23 ENCOUNTER — Telehealth: Payer: Self-pay | Admitting: Cardiology

## 2019-02-23 ENCOUNTER — Telehealth (INDEPENDENT_AMBULATORY_CARE_PROVIDER_SITE_OTHER): Payer: Medicare Other | Admitting: Cardiology

## 2019-02-23 DIAGNOSIS — R05 Cough: Secondary | ICD-10-CM | POA: Diagnosis not present

## 2019-02-23 DIAGNOSIS — I428 Other cardiomyopathies: Secondary | ICD-10-CM

## 2019-02-23 DIAGNOSIS — R059 Cough, unspecified: Secondary | ICD-10-CM | POA: Insufficient documentation

## 2019-02-23 DIAGNOSIS — I482 Chronic atrial fibrillation, unspecified: Secondary | ICD-10-CM

## 2019-02-23 DIAGNOSIS — I10 Essential (primary) hypertension: Secondary | ICD-10-CM

## 2019-02-23 DIAGNOSIS — R0609 Other forms of dyspnea: Secondary | ICD-10-CM

## 2019-02-23 DIAGNOSIS — I872 Venous insufficiency (chronic) (peripheral): Secondary | ICD-10-CM

## 2019-02-23 DIAGNOSIS — R6889 Other general symptoms and signs: Secondary | ICD-10-CM

## 2019-02-23 DIAGNOSIS — R0602 Shortness of breath: Secondary | ICD-10-CM

## 2019-02-23 MED ORDER — CHLORTHALIDONE 25 MG PO TABS: 25.0000 mg | ORAL_TABLET | Freq: Every day | ORAL | 3 refills | Status: DC

## 2019-02-23 MED ORDER — VALSARTAN 80 MG PO TABS: 80.0000 mg | ORAL_TABLET | Freq: Every day | ORAL | 3 refills | Status: DC

## 2019-02-23 NOTE — Telephone Encounter (Signed)
° °  Pt c/o Shortness Of Breath: STAT if SOB developed within the last 24 hours or pt is noticeably SOB on the phone  1. Are you currently SOB (can you hear that pt is SOB on the phone)? no  2. How long have you been experiencing SOB? 2 weeks  3. Are you SOB when sitting or when up moving around? Moving around  4. Are you currently experiencing any other symptoms? Lightheaded, dizziness

## 2019-02-23 NOTE — Telephone Encounter (Signed)
Returned call to patient of Dr. Ellyn Hack   He reports SOB for 2 weeks. He has a cough that produces phlegm in his throat that won't come out. He reports swelling in ankles that he has occasionally. He does not report weight gain. He is trying to walk and do a little bit of exercise and he has been fine with breathing during this, until today. He reports difficulty going up and down stairs. He has lightheadedness/dizziness. He does not monitor VS at home. He reports compliance with medications.   He used to take hctz and would like to take this again. He has not been seen in >1 year.   Advised would route message to MD and RN for follow up and recommendations

## 2019-02-23 NOTE — Telephone Encounter (Signed)
Per communication with MD, OK to add on to schedule today or tomorrow morning. Called patient and added him on to schedule for 5/18 @ 4:20pm. He will be able to do a telephone visit.

## 2019-02-23 NOTE — Patient Instructions (Addendum)
Medication Instructions:   Check blood pressure this week and next week until follow-up labs checked   Stop benazepril   Start valsartan 80 mg one tablet in the evening    Restart chlorthalidone 25 mg one tablet in the morning   LABS Ordered:  No orders of the defined types were placed in this encounter.  To be drawn tomorrow [facility] office) BNP and BMP  To be drawn in 1 week: BNP, CMP and Lipid panel  NOTHING TO EAT OR DRINK EXCEPT WATER MORNING OF LABS   TEST Ordered   PA and lateral chest x-ray  The labs and test to be done at [place] physician recommends that you schedule a follow-up appointment in: 6-8 weeks  The office will call you to arrange

## 2019-02-23 NOTE — Progress Notes (Signed)
Virtual Visit via Telephone Note   This visit type was conducted due to national recommendations for restrictions regarding the COVID-19 Pandemic (e.g. social distancing) in an effort to limit this patient's exposure and mitigate transmission in our community.  Due to his co-morbid illnesses, this patient is at least at moderate risk for complications without adequate follow up.  This format is felt to be most appropriate for this patient at this time.  The patient did not have access to video technology/had technical difficulties with video requiring transitioning to audio format only (telephone).  All issues noted in this document were discussed and addressed.  No physical exam could be performed with this format.  Please refer to the patient's chart for his  consent to telehealth for [facility].   No video capability.  Patient has given verbal permission to conduct this visit via virtual appointment and to bill insurance 02/24/2019 4:55 PM     Evaluation Performed:  Follow-up visit  Date:  02/24/2019   ID:  [name] [name], DOB 01/16/1946, MRN [id number]  Patient Location: Home Provider Location: Office  PCP:  Birdie Sons, MD  Cardiologist:   Glenetta Hew, MD    Chief Complaint:  cough  History of Present Illness:    [name] [name] is a 73 y.o. male with PMH notable for Chronic A Fib, NICM (EF up to 55% by recent check) & Bilatera Sever Venous Stasis /swelling who presents via Engineer, civil (consulting) for a telehealth visit today.  [name] [name] was last seen by me in March 2019, but he was then seen by Rosaria Ferries in July 2019 for dizziness and balance issues. ->  Over the course this time, his benazepril dose was reduced initially to once a day and then taking 1/2 pill in the evening.  Was referred to neurology  Interval History:  [name] had an appointment scheduled right at the beginning of the COVID-19 restrictions which was delayed because he was doing relatively  well at that time.  Unfortunately for about a week month or so he has been noticing a cough that is a dry nonproductive cough not associated with any fevers or chills.  This is been going on that is sometimes worse with exertion.  He can get dizzy and has some discomfort in his chest with coughing, but not really any anginal type chest pain. He has however also noted exertional dyspnea for instance climbing stairs but no real PND or orthopnea.  He has chronic edema that up until the last month has been pretty well controlled, but now he is also noting having lower extremity edema as well.  He still denies any sensation of rapid irregular heartbeats or palpitations.  The dizziness the issues seem to have improved, but he still has some orthostatic dizziness but not the balance issues.  Doing well on his anticoagulation no bleeding issues.  Cardiovascular ROS: positive for - dyspnea on exertion, edema and New onset cough negative for - chest pain, irregular heartbeat, orthopnea, palpitations, paroxysmal nocturnal dyspnea, rapid heart rate, shortness of breath or Syncope/near syncope, TIA/amaurosis fugax melena, hematochezia, hematuria epistaxis.  The patient does not have symptoms concerning for COVID-19 infection (fever, chills, cough, or new shortness of breath).  For the most part, despite having cough he is not having resting dyspnea and denies fevers. The patient is practicing social distancing.  ROS:  Please see the history of present illness.    ROS  Past Medical History:  Diagnosis Date  .  BPH with obstruction/lower urinary tract symptoms   . Chronic atrial fibrillation   . Dislocation of shoulder, anterior, right, closed 07/24/2014  . Echocardiogram abnormal    2009 moderate to severely dilated left atrium  . Erectile dysfunction   . Essential hypertension   . Gout   . History of chicken pox   . History of measles   . Hyperglycemia   . Hypogonadism in male   . Nocturia   .  Peyronie's disease   . Traumatic tear of right rotator cuff 07/24/2014  . Venous stasis    chronic   Past Surgical History:  Procedure Laterality Date  . CARDIAC CATHETERIZATION  03/28/2004   normal coronaries, reduced EF at 25-30% (Dr. Gerrie Nordmann)  . CARDIOVERSION, TEE guided  03/29/2004   Dr. Gerrie Nordmann   . COLONOSCOPY  05/28/2013   Dr. Rayann Heman  . HERNIA REPAIR  age 66  . KNEE SURGERY    . LUMBAR FUSION  age 39  . MYOVIEW CARDIOVASCULAR STRESS TEST  07/2003   anterior wall thinning, LV systolic function depressed at 33%  . PILONIDAL CYST EXCISION    . Right Eyebrow Tumor Removed  age 59   . SHOULDER ARTHROSCOPY WITH ROTATOR CUFF REPAIR AND SUBACROMIAL DECOMPRESSION Right 08/19/2014   Procedure: SHOULDER ARTHROSCOPY WITH ROTATOR CUFF REPAIR AND SUBACROMIAL DECOMPRESSION;  Surgeon: Lorn Junes, MD;  Location: [facility];  Service: Orthopedics;  Laterality: Right;  . TOTAL HIP ARTHROPLASTY Right 03/25/2006   Dr. Wanda Plump. [place]  . TRANSTHORACIC ECHOCARDIOGRAM  08/2008   EF=>55%, mild-mod dilated; LA mod-severely dilated; RA mild-mod dilated; mild calcif of MV, borderline MVP, trace MR; trace TR; mild pulm valve regurg      Current Meds  Medication Sig  . carvedilol (COREG) 12.5 MG tablet TAKE 1 TABLET BY MOUTH TWICE A DAY  . Multiple Vitamin (MULTIVITAMIN) tablet Take 1 tablet by mouth daily.    Alveda Reasons 20 MG TABS tablet TAKE 1 TABLET BY MOUTH ONCE A DAY WITH SUPPER  . [DISCONTINUED] benazepril (LOTENSIN) 20 MG tablet TAKE 1 TABLET BY MOUTH IN THE MORNING AND 1/2 TABLET IN THE EVENING AS DIRECTED     Allergies:   Benazepril   Social History   Tobacco Use  . Smoking status: Never Smoker  . Smokeless tobacco: Never Used  Substance Use Topics  . Alcohol use: Yes    Alcohol/week: 0.0 standard drinks    Comment: 4 or more per week  . Drug use: No     Family Hx: The patient's family history includes Arthritis in his sister; Bladder Cancer in his father; Diabetes  in his maternal uncle and paternal grandfather; Hypertension in his father and mother; Prostate cancer in his father; Stroke in his maternal grandfather; Supraventricular tachycardia in his brother and child. There is no history of Kidney disease, Heart attack, or Colon cancer.   Prior CV studies:   The following studies were reviewed today: . No recent checks:  Labs/Other Tests and Data Reviewed:    EKG:  No ECG reviewed.  Recent Labs: 10/06/2018: ALT 24; Hemoglobin 14.3; Platelets 300 02/24/2019: B Natriuretic Peptide 350.0; BUN 13; Creatinine, Ser 0.95; Potassium 4.3; Sodium 139   Recent Lipid Panel Lab Results  Component Value Date/Time   CHOL 166 10/06/2018 09:58 AM   TRIG 98 10/06/2018 09:58 AM   TRIG 65 01/06/2009   HDL 68 10/06/2018 09:58 AM   CHOLHDL 2.4 10/06/2018 09:58 AM   LDLCALC 78 10/06/2018 09:58 AM  Wt Readings from Last 3 Encounters:  10/06/18 245 lb (111.1 kg)  09/23/18 244 lb 8 oz (110.9 kg)  05/06/18 239 lb 12.8 oz (108.8 kg)     Objective:    Vital Signs:  There were no vitals taken for this visit.  VITAL SIGNS:  reviewed GEN:  no acute distress RESPIRATORY:  non-labored NEURO:  A&Ox 3.  answers ? appropriately PSYCH:  normal affect   ASSESSMENT & PLAN:    Problem List Items Addressed This Visit    Chronic atrial fibrillation (HCC) - CHA2DS2-VASc Score 3; On Xarelto (Chronic)    Chronic usually asymptomatic A. fib at baseline.  I think he is pretty much in baseline all the time with carvedilol for rate control.  Is on Xarelto for anticoagulation.  He still has not noticed any sensation of irregular heartbeats.  But he has noted some dizziness of late.  I do not think that his cough is related to A. fib unless there is worsening CHF.  We are checking a 2D echocardiogram and BNP.  Depending on BNP may need to check 2D echocardiogram  No change for now.  May need to exclude ischemia if symptoms do not improve.      Relevant Medications    valsartan (DIOVAN) 80 MG tablet   chlorthalidone (HYGROTON) 25 MG tablet   Cough-new onset, nonproductive - Primary    New onset cough without fever.  Exertional dyspnea, but not resting.  Plan: Check chest x-[name] and BNP -> if either is abnormal, would probably check 2D echo.  Convert from ACE inhibitor to ARB -since we are going to go from a twice daily dosing with plans to potentially stop 1 dose while adding chlorthalidone, we will simply do valsartan 80 mg daily and follow blood pressures.  Restart chlorthalidone for possible diuresis      Relevant Orders   DG Chest 2 View   B Nat Peptide (Completed)   Essential hypertension (Chronic)    Unfortunately do not have a blood pressure check on him today.  He is currently taking benazepril 20 mg in the morning and 10 at night along with carvedilol.  No longer taking chlorthalidone.  With his new cough and swelling him and have him restart chlorthalidone, I will also have him stop the ACE inhibitor and start on ARB.  With the new start of chlorthalidone 25 mg, I am actually can just start a lower dose of valsartan 80 mg which may need to be titrated up further.      Relevant Medications   valsartan (DIOVAN) 80 MG tablet   chlorthalidone (HYGROTON) 25 MG tablet   Other Relevant Orders   DG Chest 2 View   Basic metabolic panel (Completed)   Exercise intolerance    He has had exercise intolerance in the past.  I do not think this is related to his carvedilol since he is now noticing cough and exertional dyspnea associated with his well.  Plan: Evaluate for CHF with BNP and chest x-[name]  2D echo if necessary. Reduce blood pressure control by reducing ACE inhibitor dose and converting to valsartan 80 mg for cough.  Restart chlorthalidone 25 mg daily      Exertional dyspnea    Not sure exactly what this is related to.  Need to exclude CHF --Check BNP and chest x-[name]  Low threshold for 2D echo.  If there is evidence of CHF and the EF is  reduced over there is regional wall motion normality, would have  low threshold for ischemic evaluation.      NICM (nonischemic cardiomyopathy) ([place]) (Chronic)    History of reduced EF associated with A. fib in the past.  Had resolved as of 2009, but would now having new onset cough and exertional dyspnea, we will check BNP and chest x-[name]  Low threshold to check 2D echo.  Converting from ACE inhibitor to ARB and continuing carvedilol.      Relevant Medications   valsartan (DIOVAN) 80 MG tablet   chlorthalidone (HYGROTON) 25 MG tablet   Venous stasis dermatitis (Chronic)    Other Visit Diagnoses    SOB (shortness of breath)       Relevant Orders   DG Chest 2 View   B Nat Peptide (Completed)   Basic metabolic panel (Completed)    With concern of possible CAD, will also have him check a fasting lipid panel along with BNP and BMP for elevation for CHF.  COVID-19 Education: The signs and symptoms of COVID-19 were discussed with the patient and how to seek care for testing (follow up with PCP or arrange E-visit).   The importance of social distancing was discussed today.  Time:   Today, I have spent 28 minutes with the patient with telehealth technology discussing the above problems.     Medication Adjustments/Labs and Tests Ordered: Current medicines are reviewed at length with the patient today.  Concerns regarding medicines are outlined above.  Medication Instructions:   Check blood pressure this week and next week until follow-up labs checked   Discontinue benazepril  Start valsartan 80 mg p.o. in the evening (needs new prescription 80 mg tablets, once daily, dispense 90 tabs with 4 refills)  Restart chlorthalidone 25 mg p.o. in the morning (Rx chlorthalidone 25 mg p.o. every morning, dispense 90 tabs with 4 refills)  Tests Ordered: Orders Placed This Encounter  Procedures  . DG Chest 2 View  . B Nat Peptide  . Basic metabolic panel   To be drawn tomorrow [facility]  office) BNP and BMP  To be drawn in 1 week: BNP, CMP and fasting  PA and lateral chest x-[name]  Medication Changes: Meds ordered this encounter  Medications  . valsartan (DIOVAN) 80 MG tablet    Sig: Take 1 tablet (80 mg total) by mouth daily. In the evening.    Dispense:  90 tablet    Refill:  3    D/C BENAZEPRIL  . chlorthalidone (HYGROTON) 25 MG tablet    Sig: Take 1 tablet (25 mg total) by mouth daily. In the morning    Dispense:  90 tablet    Refill:  3    Appointment needed  See above  Disposition:  Follow up in 4-6 week(s)    Signed, Glenetta Hew, MD  02/24/2019 4:55 PM    [place]

## 2019-02-24 ENCOUNTER — Other Ambulatory Visit
Admission: RE | Admit: 2019-02-24 | Discharge: 2019-02-24 | Disposition: A | Discharge status: Home / Self Care | Payer: Medicare Other | Source: Ambulatory Visit | Attending: Cardiology | Admitting: Cardiology

## 2019-02-24 ENCOUNTER — Ambulatory Visit
Admission: RE | Admit: 2019-02-24 | Discharge: 2019-02-24 | Disposition: A | Discharge status: Home / Self Care | Payer: Medicare Other | Source: Ambulatory Visit | Attending: Cardiology | Admitting: Cardiology

## 2019-02-24 ENCOUNTER — Telehealth: Payer: Self-pay | Admitting: Cardiology

## 2019-02-24 ENCOUNTER — Encounter: Payer: Self-pay | Admitting: Cardiology

## 2019-02-24 DIAGNOSIS — R0602 Shortness of breath: Secondary | ICD-10-CM | POA: Insufficient documentation

## 2019-02-24 DIAGNOSIS — R05 Cough: Secondary | ICD-10-CM | POA: Diagnosis present

## 2019-02-24 DIAGNOSIS — I10 Essential (primary) hypertension: Secondary | ICD-10-CM | POA: Insufficient documentation

## 2019-02-24 DIAGNOSIS — R059 Cough, unspecified: Secondary | ICD-10-CM

## 2019-02-24 DIAGNOSIS — R0600 Dyspnea, unspecified: Secondary | ICD-10-CM | POA: Insufficient documentation

## 2019-02-24 DIAGNOSIS — R0609 Other forms of dyspnea: Secondary | ICD-10-CM | POA: Insufficient documentation

## 2019-02-24 LAB — BASIC METABOLIC PANEL
Anion gap: 7 (ref 5–15)
BUN: 13 mg/dL (ref 8–23)
CO2: 30 mmol/L (ref 22–32)
Calcium: 9.9 mg/dL (ref 8.9–10.3)
Chloride: 102 mmol/L (ref 98–111)
Creatinine, Ser: 0.95 mg/dL (ref 0.61–1.24)
GFR calc Af Amer: >60 mL/min (ref >60)
GFR calc non Af Amer: >60 mL/min (ref >60)
Glucose, Bld: 118 mg/dL — ABNORMAL HIGH (ref 70–99)
Potassium: 4.3 mmol/L (ref 3.5–5.1)
Sodium: 139 mmol/L (ref 135–145)

## 2019-02-24 LAB — BRAIN NATRIURETIC PEPTIDE: B Natriuretic Peptide: 350 pg/mL — ABNORMAL HIGH (ref 0.0–100.0)

## 2019-02-24 NOTE — Telephone Encounter (Signed)
New Message   Pt c/o BP issue:  1. What are your last 5 BP readings? 180/90, 160/82 2. Are you having any other symptoms (ex. Dizziness, headache, blurred vision, passed out)? Patient states that in the morning when he gets up he has dizziness 3. What is your medication issue? None

## 2019-02-24 NOTE — Assessment & Plan Note (Signed)
He has had exercise intolerance in the past.  I do not think this is related to his carvedilol since he is now noticing cough and exertional dyspnea associated with his well.  Plan: Evaluate for CHF with BNP and chest x-ray.  2D echo if necessary. Reduce blood pressure control by reducing ACE inhibitor dose and converting to valsartan 80 mg for cough.  Restart chlorthalidone 25 mg daily

## 2019-02-24 NOTE — Assessment & Plan Note (Signed)
History of reduced EF associated with A. fib in the past.  Had resolved as of 2009, but would now having new onset cough and exertional dyspnea, we will check BNP and chest x-ray.  Low threshold to check 2D echo.  Converting from ACE inhibitor to ARB and continuing carvedilol.

## 2019-02-24 NOTE — Telephone Encounter (Signed)
Returned call to patient Dr.[name]'s advice given.Advised to continue to monitor B/P and call back the end of week if he does not see any improvement.

## 2019-02-24 NOTE — Assessment & Plan Note (Signed)
Not sure exactly what this is related to.  Need to exclude CHF --Check BNP and chest x-ray.  Low threshold for 2D echo.  If there is evidence of CHF and the EF is reduced over there is regional wall motion normality, would have low threshold for ischemic evaluation.

## 2019-02-24 NOTE — Assessment & Plan Note (Signed)
Unfortunately do not have a blood pressure check on him today.  He is currently taking benazepril 20 mg in the morning and 10 at night along with carvedilol.  No longer taking chlorthalidone.  With his new cough and swelling him and have him restart chlorthalidone, I will also have him stop the ACE inhibitor and start on ARB.  With the new start of chlorthalidone 25 mg, I am actually can just start a lower dose of valsartan 80 mg which may need to be titrated up further.

## 2019-02-24 NOTE — Telephone Encounter (Signed)
Follow up:   Patient retunring your call back. Please call patient.

## 2019-02-24 NOTE — Telephone Encounter (Signed)
Spoke to patient he stated he wanted Dr.[name] to know his cough is better today.He had lab work and cxr today.Stated he is concerned his B/P is elevated 180/90,160/80 pulse 72.Advised I will send message to [place].

## 2019-02-24 NOTE — Telephone Encounter (Signed)
Let us follow the blood pressure recordings for the next few days on chlorthalidone and current dose of valsartan.  If they are still elevated, we can double the valsartan.  We just changed from the ACE inhibitor to ARB.  Need to give it some time to reassess.  I have seen the results of the lab work, but the chest x-ray is not yet been read.  Lab work does suggest some mild CHF component and therefore we are recommending a 2D echocardiogram that Ivin Booty should be working on getting scheduled.  Glenetta Hew, MD

## 2019-02-24 NOTE — Telephone Encounter (Signed)
Returned call to patient no answer.LMTC. 

## 2019-02-24 NOTE — Assessment & Plan Note (Signed)
Chronic usually asymptomatic A. fib at baseline.  I think he is pretty much in baseline all the time with carvedilol for rate control.  Is on Xarelto for anticoagulation.  He still has not noticed any sensation of irregular heartbeats.  But he has noted some dizziness of late.  I do not think that his cough is related to A. fib unless there is worsening CHF.  We are checking a 2D echocardiogram and BNP.  Depending on BNP may need to check 2D echocardiogram  No change for now.  May need to exclude ischemia if symptoms do not improve.

## 2019-02-24 NOTE — Assessment & Plan Note (Signed)
New onset cough without fever.  Exertional dyspnea, but not resting.  Plan: Check chest x-ray and BNP -> if either is abnormal, would probably check 2D echo.  Convert from ACE inhibitor to ARB -since we are going to go from a twice daily dosing with plans to potentially stop 1 dose while adding chlorthalidone, we will simply do valsartan 80 mg daily and follow blood pressures.  Restart chlorthalidone for possible diuresis

## 2019-02-27 ENCOUNTER — Telehealth: Payer: Self-pay | Admitting: *Deleted

## 2019-02-27 DIAGNOSIS — R0609 Other forms of dyspnea: Secondary | ICD-10-CM

## 2019-02-27 DIAGNOSIS — I428 Other cardiomyopathies: Secondary | ICD-10-CM

## 2019-02-27 NOTE — Telephone Encounter (Signed)
Left message to call back about xray and labs

## 2019-02-27 NOTE — Telephone Encounter (Signed)
-----   Message from Leonie Man, MD sent at 02/24/2019  2:50 PM EDT ----- BNP level is slightly elevated which would suggest that shortness of breath and swelling could be related to heart failure.  Otherwise labs look pretty good with exception of mildly elevated glucose levels.  Sodium potassium and kidney function look fine.  Let see how things go with restarting chlorthalidone and changing from ACE inhibitor to valsartan.  We will also look at the chest x-ray.  May need to use as needed Lasix.  Also, I would like to add 2D echocardiogram to be done at [facility] to evaluate for worsening pump function.  Glenetta Hew, MD   Ivin Booty: Can you please order 2D echocardiogram at [facility].

## 2019-02-27 NOTE — Telephone Encounter (Signed)
-----   Message from Leonie Man, MD sent at 02/25/2019  5:33 PM EDT ----- Chest x-ray does not not really show any significant findings to suggest CHF and/or pneumonia. Based on the slightly elevated BNP level we are ordering a 2D echocardiogram.  Glenetta Hew, MD

## 2019-03-03 ENCOUNTER — Other Ambulatory Visit: Payer: Self-pay

## 2019-03-03 ENCOUNTER — Other Ambulatory Visit
Admission: RE | Admit: 2019-03-03 | Discharge: 2019-03-03 | Disposition: A | Discharge status: Home / Self Care | Payer: Medicare Other | Source: Ambulatory Visit | Attending: Cardiology | Admitting: Cardiology

## 2019-03-03 DIAGNOSIS — R739 Hyperglycemia, unspecified: Secondary | ICD-10-CM | POA: Diagnosis not present

## 2019-03-03 DIAGNOSIS — I10 Essential (primary) hypertension: Secondary | ICD-10-CM | POA: Insufficient documentation

## 2019-03-03 DIAGNOSIS — R0609 Other forms of dyspnea: Secondary | ICD-10-CM

## 2019-03-03 LAB — LIPID PANEL
Cholesterol: 173 mg/dL (ref 0–200)
HDL: 44 mg/dL (ref >40)
LDL Cholesterol: 111 mg/dL — ABNORMAL HIGH (ref 0–99)
Total CHOL/HDL Ratio: 3.9 RATIO
Triglycerides: 91 mg/dL (ref <150)
VLDL: 18 mg/dL (ref 0–40)

## 2019-03-03 LAB — COMPREHENSIVE METABOLIC PANEL
ALT: 80 U/L — ABNORMAL HIGH (ref 0–44)
AST: 57 U/L — ABNORMAL HIGH (ref 15–41)
Albumin: 4.3 g/dL (ref 3.5–5.0)
Alkaline Phosphatase: 78 U/L (ref 38–126)
Anion gap: 10 (ref 5–15)
BUN: 15 mg/dL (ref 8–23)
CO2: 27 mmol/L (ref 22–32)
Calcium: 9.6 mg/dL (ref 8.9–10.3)
Chloride: 102 mmol/L (ref 98–111)
Creatinine, Ser: 0.93 mg/dL (ref 0.61–1.24)
GFR calc Af Amer: >60 mL/min (ref >60)
GFR calc non Af Amer: >60 mL/min (ref >60)
Glucose, Bld: 118 mg/dL — ABNORMAL HIGH (ref 70–99)
Potassium: 3.9 mmol/L (ref 3.5–5.1)
Sodium: 139 mmol/L (ref 135–145)
Total Bilirubin: 1.2 mg/dL (ref 0.3–1.2)
Total Protein: 7.8 g/dL (ref 6.5–8.1)

## 2019-03-03 LAB — BRAIN NATRIURETIC PEPTIDE: B Natriuretic Peptide: 162 pg/mL — ABNORMAL HIGH (ref 0.0–100.0)

## 2019-03-03 NOTE — Telephone Encounter (Signed)
Patient is returning call.  °

## 2019-03-04 NOTE — Telephone Encounter (Signed)
-----   Message from Leonie Man, MD sent at 03/03/2019  4:41 PM EDT ----- BNP level is coming down nicely since we made the medicine changes.  Other chemistry levels look good. Kidney function looks good.  Liver function looks a little bit off. -- HOLD STATIN x 2 weeks. Recheck LFTs in 2 weeks. Glenetta Hew, MD

## 2019-03-04 NOTE — Telephone Encounter (Signed)
Spoke to patient , result given  For all labs and chest xray.  Patient is not taken a statin at present time.   Will defer back to Dr Ellyn Hack- in regards to holding statins for 2 weeks

## 2019-03-18 ENCOUNTER — Other Ambulatory Visit: Payer: Self-pay

## 2019-03-18 DIAGNOSIS — Z79899 Other long term (current) drug therapy: Secondary | ICD-10-CM

## 2019-03-18 NOTE — Progress Notes (Signed)
Notes recorded by Leonie Man, MD on 03/05/2019 at 11:01 PM EDT Still recheck LFTs & BNP in 2 weeks [facility]

## 2019-04-14 NOTE — Telephone Encounter (Signed)
Still need to recheck LFTs --  OK that he is off Statin.  Need to check LFTs & Lipids (if not done now - check in Late July)  Glenetta Hew, MD

## 2019-05-27 ENCOUNTER — Other Ambulatory Visit: Payer: Self-pay | Admitting: Internal Medicine

## 2019-05-27 DIAGNOSIS — Z20822 Contact with and (suspected) exposure to covid-19: Secondary | ICD-10-CM

## 2019-05-28 LAB — NOVEL CORONAVIRUS, NAA: SARS-CoV-2, NAA: NOT DETECTED

## 2019-07-26 IMAGING — CR DG UGI W/ KUB
14 of 15 series · 14 of 15 positions shown · non-contrast
Comparison: No recent studies in PACs

CLINICAL DATA: Abdominal bloating, rapid transit symptoms with need
to move his bowels very shortly after any meal. Occasional nausea.
History of fatty liver.

EXAM:
UPPER GI SERIES WITH KUB
TECHNIQUE: After obtaining a scout radiograph a routine upper GI series was
performed using thin and high density barium. Effervescent crystals
and a barium tablet were administered.
FLUOROSCOPY TIME:  Fluoroscopy Time:  1 minutes, 18 seconds.
Radiation Exposure Index (if provided by the fluoroscopic device):
3571 micro Gy per meters square
Number of Acquired Spot Images: 13+ 2 scout images.

[t abdomen supine (1 of 2)]
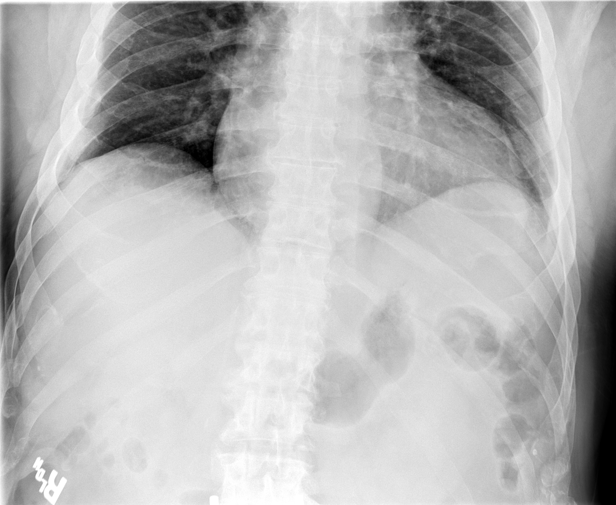

[t abdomen supine (2 of 2)]
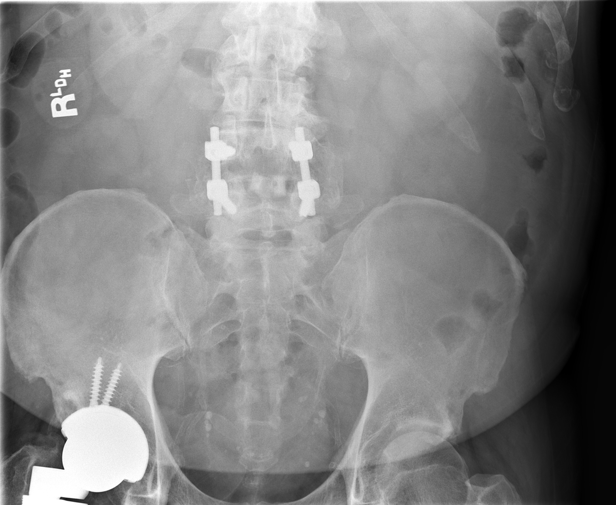

[fluoro_barium singleshot_bw (1 of 12)]
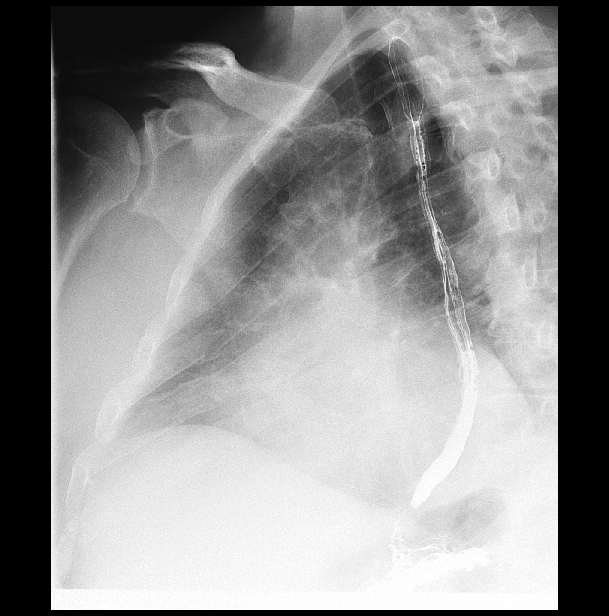

[fluoro_barium singleshot_bw (2 of 12)]
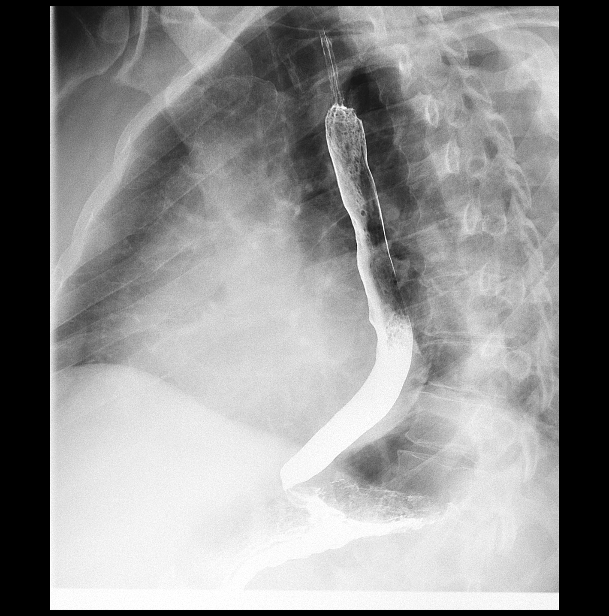

[fluoro_barium singleshot_bw (3 of 12)]
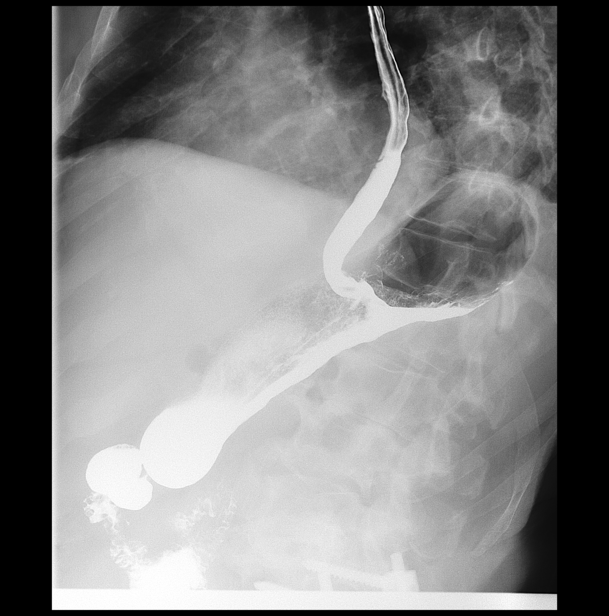

[fluoro_barium singleshot_bw (4 of 12)]
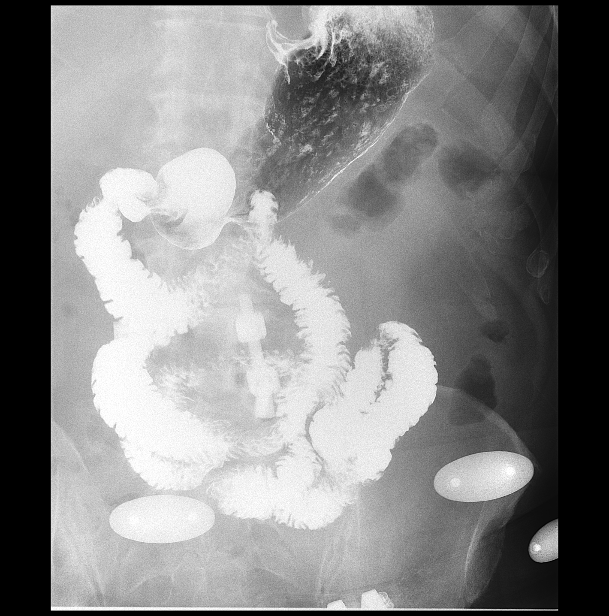

[fluoro_barium singleshot_bw (5 of 12)]
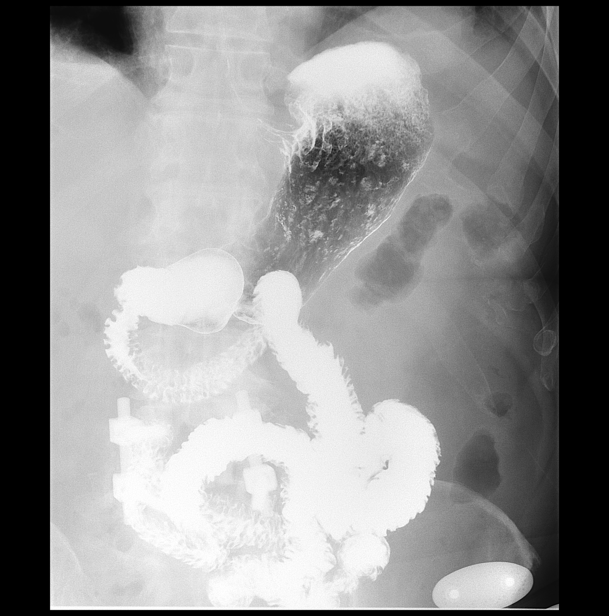

[fluoro_barium singleshot_bw (6 of 12)]
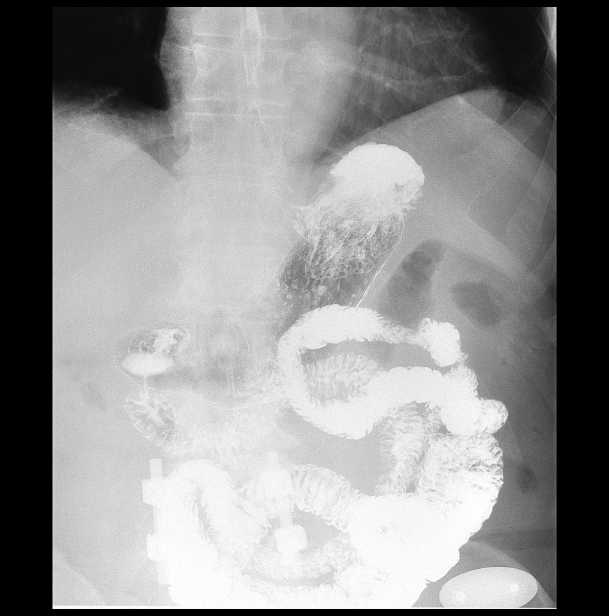

[fluoro_barium singleshot_bw (7 of 12)]
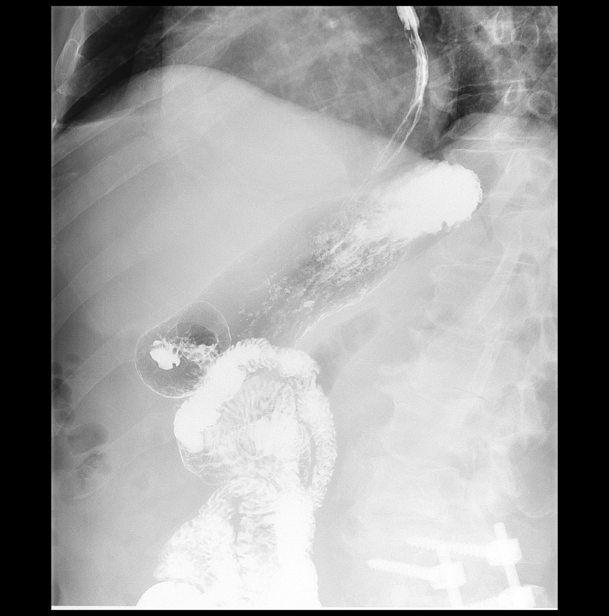

[fluoro_barium singleshot_bw (8 of 12)]
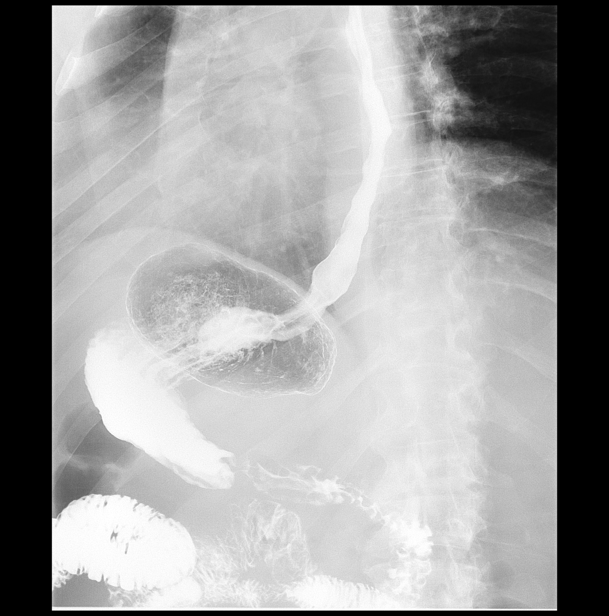

[fluoro_barium singleshot_bw (9 of 12)]
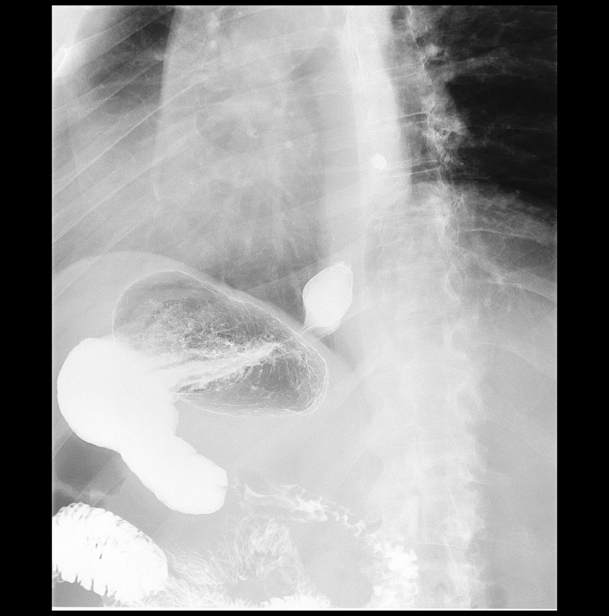

[fluoro_barium singleshot_bw (10 of 12)]
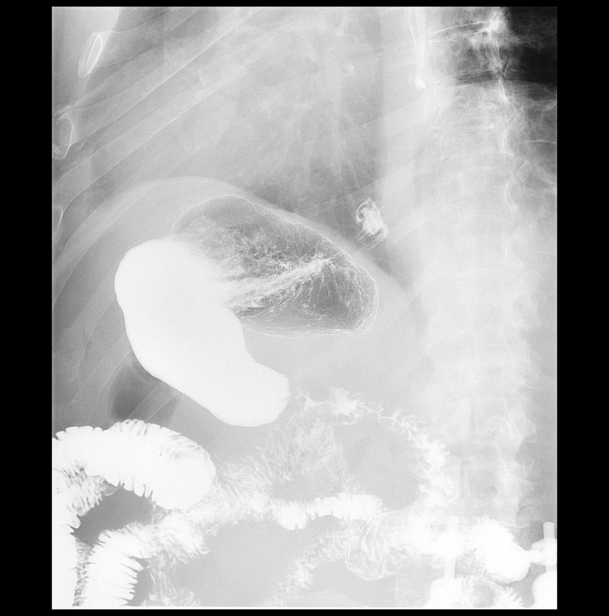

[fluoro_barium singleshot_bw (11 of 12)]
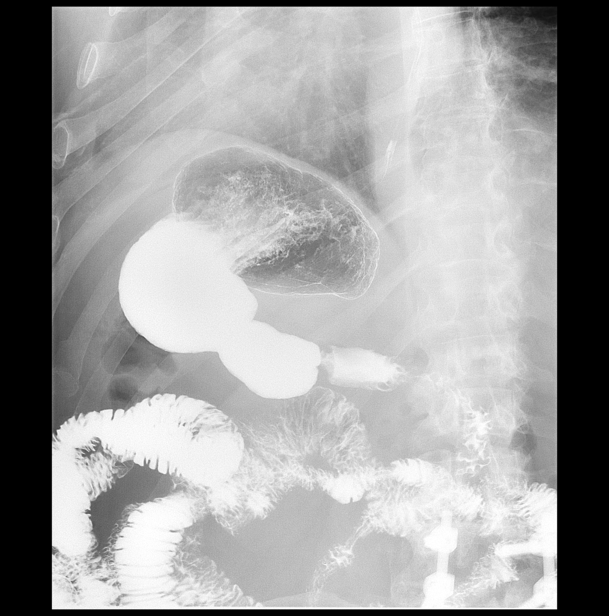

[fluoro_barium singleshot_bw (12 of 12)]
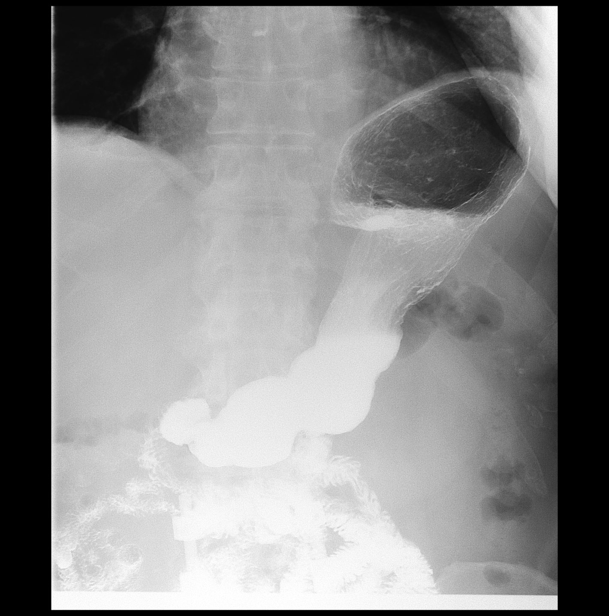

[14 of 15 positions shown; findings below may reference images not displayed]

FINDINGS: The scout radiograph reveals a normal bowel gas pattern. The patient
has undergone previous posterior lower lumbar fusion as well as
right hip joint prosthesis placement.

The patient ingested thick and thin barium and the gas-forming
crystals without difficulty. The thoracic esophagus distended well.
Motility was normal. A tiny reducible hiatal hernia was
demonstrated. No gastroesophageal reflux was observed.

The stomach distended well. Gastric emptying was rapid. The mucosal
fold pattern were was grossly normal. The duodenal bulb and Celsius
sweep appeared normal.

The barium had traversed much of the jejunum by the conclusion of
the study and had reach the proximal ileum. The barium tablet passed
promptly from the mouth to the stomach.
IMPRESSION: Small reducible hiatal hernia without evidence of esophagitis or
stricture or reflux. The barium tablet passed without difficulty.

Normal appearance of the stomach and duodenum. Rapid emptying of the
stomach was observed.

Rapid transit of the barium through the small bowel was noted at the
conclusion of this upper GI series.

## 2019-08-11 ENCOUNTER — Other Ambulatory Visit: Payer: Self-pay | Admitting: Internal Medicine

## 2019-08-20 ENCOUNTER — Telehealth: Payer: Self-pay | Admitting: Cardiology

## 2019-08-20 HISTORY — PX: MOHS SURGERY: SUR867

## 2019-08-20 NOTE — Telephone Encounter (Signed)
Returned call to pt he states that his ankles are very swollen. He states that his weight is pretty stable and within 3# since his last visit. He states that his weight is renging 249-250# for at least the last 2 weeks denies any sx SOB, DOE, chest pain or pressure, etc... he was here for his LOV 02-23-2019 and was to f/u in 6-8 weeks. He states that he is taking all his medications as ordered. His BP 2 weeks ago was normal 120/60. I have scheduled him the next avail OV with Florina Ou [id number] . He will arrive early alone and in a mask

## 2019-08-20 NOTE — Telephone Encounter (Signed)
New message  Pt c/o swelling: STAT is pt has developed SOB within 24 hours  1) How much weight have you gained and in what time span? No   2) If swelling, where is the swelling located? Both ankles  3) Are you currently taking a fluid pill? Yes   4) Are you currently SOB? No   5) Do you have a log of your daily weights (if so, list)? Yes   6) Have you gained 3 pounds in a day or 5 pounds in a week? No   7) Have you traveled recently? no

## 2019-08-24 ENCOUNTER — Encounter: Payer: Self-pay | Admitting: Family Medicine

## 2019-08-24 DIAGNOSIS — Z85828 Personal history of other malignant neoplasm of skin: Secondary | ICD-10-CM | POA: Insufficient documentation

## 2019-08-31 NOTE — Progress Notes (Signed)
Cardiology Clinic Note   Patient Name: [name] Date of Encounter: 09/01/2019  Primary Care Provider:  Birdie Sons, MD Primary Cardiologist:  Glenetta Hew, MD  Patient Profile    [name] 73 year old male presents today for follow-up of his essential hypertension, chronic atrial fibrillation, peripheral vascular disease and nonischemic cardiomyopathy.  Past Medical History    Past Medical History:  Diagnosis Date  . BPH with obstruction/lower urinary tract symptoms   . Chronic atrial fibrillation ([place])   . Dislocation of shoulder, anterior, right, closed 07/24/2014  . Echocardiogram abnormal    2009 moderate to severely dilated left atrium  . Erectile dysfunction   . Essential hypertension   . Gout   . History of chicken pox   . History of measles   . Hyperglycemia   . Hypogonadism in male   . Nocturia   . Peyronie's disease   . Traumatic tear of right rotator cuff 07/24/2014  . Venous stasis    chronic   Past Surgical History:  Procedure Laterality Date  . CARDIAC CATHETERIZATION  03/28/2004   normal coronaries, reduced EF at 25-30% (Dr. Gerrie Nordmann)  . CARDIOVERSION, TEE guided  03/29/2004   Dr. Gerrie Nordmann   . COLONOSCOPY  05/28/2013   Dr. Rayann Heman  . HERNIA REPAIR  age 5  . KNEE SURGERY    . LUMBAR FUSION  age 60  . MOHS SURGERY Right 08/20/2019   ALA done by Ishmael Holter, MD [facility]  . MYOVIEW CARDIOVASCULAR STRESS TEST  07/2003   anterior wall thinning, LV systolic function depressed at 33%  . PILONIDAL CYST EXCISION    . Right Eyebrow Tumor Removed  age 44   . SHOULDER ARTHROSCOPY WITH ROTATOR CUFF REPAIR AND SUBACROMIAL DECOMPRESSION Right 08/19/2014   Procedure: SHOULDER ARTHROSCOPY WITH ROTATOR CUFF REPAIR AND SUBACROMIAL DECOMPRESSION;  Surgeon: Lorn Junes, MD;  Location: [place];  Service: Orthopedics;  Laterality: Right;  . TOTAL HIP ARTHROPLASTY Right 03/25/2006   Dr. Wanda Plump. [place]  . TRANSTHORACIC ECHOCARDIOGRAM   08/2008   EF=>55%, mild-mod dilated; LA mod-severely dilated; RA mild-mod dilated; mild calcif of MV, borderline MVP, trace MR; trace TR; mild pulm valve regurg     Allergies  Allergies  Allergen Reactions  . Benazepril     COUGH     History of Present Illness    [name] has a past medical history of chronic A. fib, and ICM (LVEF up to 55%), bilateral venous stasis, lower extremity edema, essential hypertension, hyperglycemia, and exertional dyspnea.  He underwent cardiac catheterization on 03/28/2004 which showed normal coronary arteries and reduced LVEF at 25 to 30% by Dr. Tami Ribas.  He had DCCV by Dr. Tami Ribas on 03/29/2004.  He was last seen by Dr. Ellyn Hack via virtual platform on 02/23/2019.  During that time he was doing well but had developed today cough without fever and was experiencing exertional dyspnea but did not have any dyspnea at rest.  His A. fib was rate controlled with his carvedilol and he was still taking his Xarelto.  He did not notice any sensations of irregular heartbeats.  However, he did notice occasional episodes of dizziness.  His Benzapril 20 mg was stopped, his chlorthalidone 25 mg was restarted as well as valsartan 80 mg.  Due to his cough and exertional dyspnea a chest x-ray was ordered but did not show pneumonia or CHF like images.  An echocardiogram was mentioned as the next step in plan of care however,  a follow-up echocardiogram was not completed.  He presents to the clinic today and states he experiences leg swelling 2-3 times per year.  He states that his leg swelling usually dissipates however, with this occurrence his legs have been swollen for around a month.  He does not have any pain with the increased lower extremity edema.  He states that he tries to stay away from sodium and does not salt his foods.  He has worn lower extremity support stockings in the past but does not like them.  He continues to be very active doing strength training at the [facility] 3 days  a week and walking 4 days a week for 20 minutes or more on days that he is not at the [facility].  He states his blood pressures been much better at home and routinely runs in the 120s over 60s.  He is currently checking his blood pressure about 1 time per week.  He states that his cough has completely resolved after the change from his Benzapril to valsartan.  He felt he did not need the echocardiogram and states he feels fine today.  We will hold off on repeat echocardiogram at this time.  I will check his BMP today and prescribed furosemide 20 mg daily as needed to use no more than 3 times per week.  He denies chest pain, shortness of breath, fatigue, palpitations, melena, hematuria, hemoptysis, diaphoresis, weakness, presyncope, syncope, orthopnea, and PND.   Home Medications    Prior to Admission medications   Medication Sig Start Date End Date Taking? Authorizing Provider  carvedilol (COREG) 12.5 MG tablet TAKE 1 TABLET BY MOUTH TWICE A DAY 01/08/19   Leonie Man, MD  chlorthalidone (HYGROTON) 25 MG tablet Take 1 tablet (25 mg total) by mouth daily. In the morning 02/23/19   Leonie Man, MD  Multiple Vitamin (MULTIVITAMIN) tablet Take 1 tablet by mouth daily.      [provider]  valsartan (DIOVAN) 80 MG tablet Take 1 tablet (80 mg total) by mouth daily. In the evening. 02/23/19   Leonie Man, MD  XARELTO 20 MG TABS tablet TAKE 1 TABLET BY MOUTH ONCE DAILY WITH SUPPER 08/11/19   Leonie Man, MD    Family History    Family History  Problem Relation Age of Onset  . Hypertension Father   . Prostate cancer Father   . Bladder Cancer Father   . Hypertension Mother   . Stroke Maternal Grandfather   . Diabetes Paternal Grandfather   . Supraventricular tachycardia Brother   . Arthritis Sister        RA  . Diabetes Maternal Uncle        TYPE 2  . Supraventricular tachycardia Child   . Kidney disease Neg Hx   . Heart attack Neg Hx   . Colon cancer Neg Hx    He  indicated that his mother is alive. He indicated that his father is deceased. He indicated that both of his sisters are alive. He indicated that both of his brothers are alive. He indicated that his maternal grandmother is deceased. He indicated that his maternal grandfather is deceased. He indicated that his paternal grandmother is deceased. He indicated that his paternal grandfather is deceased. He indicated that the status of his child is unknown. He indicated that his maternal uncle is deceased. He indicated that the status of his neg hx is unknown.  Social History    Social History   Socioeconomic History  .  Marital status: Single    Spouse name: Not on file  . Number of children: 1  . Years of education: Not on file  . Highest education level: Master's degree (e.g., MA, MS, MEng, MEd, MSW, MBA)  Occupational History  . Occupation: retired Pharmacist, hospital, taught PE and coached  Social Needs  . Financial resource strain: Not hard at all  . Food insecurity    Worry: Never true    Inability: Never true  . Transportation needs    Medical: No    Non-medical: No  Tobacco Use  . Smoking status: Never Smoker  . Smokeless tobacco: Never Used  Substance and Sexual Activity  . Alcohol use: Yes    Alcohol/week: 0.0 standard drinks    Comment: 4 or more per week  . Drug use: No  . Sexual activity: Not on file  Lifestyle  . Physical activity    Days per week: Not on file    Minutes per session: Not on file  . Stress: Not at all  Relationships  . Social Herbalist on phone: Not on file    Gets together: Not on file    Attends religious service: Not on file    Active member of club or organization: Not on file    Attends meetings of clubs or organizations: Not on file    Relationship status: Not on file  . Intimate partner violence    Fear of current or ex partner: No    Emotionally abused: No    Physically abused: No    Forced sexual activity: No  Other Topics Concern  .  Not on file  Social History Narrative   Divorced father of 1. Retired Education officer, museum.   ~2 beers / day. Does not smoke.   Works out 6 days a weeks - cardio & weights. He used to run and play basketball prior to his hip surgery in 2007.   Currently dating.     Review of Systems    General:  No chills, fever, night sweats or weight changes.  Cardiovascular:  No chest pain, dyspnea on exertion, edema, orthopnea, palpitations, paroxysmal nocturnal dyspnea. Dermatological: No rash, lesions/masses Respiratory: No cough, dyspnea Urologic: No hematuria, dysuria Abdominal:   No nausea, vomiting, diarrhea, bright red blood per rectum, melena, or hematemesis Neurologic:  No visual changes, wkns, changes in mental status. All other systems reviewed and are otherwise negative except as noted above.  Physical Exam    VS:  BP (!) 148/90   Pulse 69   Ht 6\' 3"  (1.905 m)   Wt 254 lb 6.4 oz (115.4 kg)   SpO2 97%   BMI 31.80 kg/m  , BMI Body mass index is 31.8 kg/m. GEN: Well nourished, well developed, in no acute distress. HEENT: normal. Neck: Supple, no JVD, carotid bruits, or masses. Cardiac: Irregularly irregular, no murmurs, rubs, or gallops. No clubbing, cyanosis, bilateral lower extremity +1 pitting edema to mid shin.  Radials/DP/PT 2+ and equal bilaterally.  Respiratory:  Respirations regular and unlabored, clear to auscultation bilaterally. GI: Soft, nontender, nondistended, BS + x 4. MS: no deformity or atrophy. Skin: warm and dry, no rash. Neuro:  Strength and sensation are intact. Psych: Normal affect.  Accessory Clinical Findings    ECG personally reviewed by me today-atrial fibrillation with premature ventricular or aberrantly conducted complexes nonspecific ST abnormality 69 bpm- No acute changes  EKG 12/31/2017 Atrial fibrillation 79 bpm    Assessment & Plan   1.  Atrial fibrillation-chronic (CHA2DS2-VASc score 3) Continue Xarelto Continue carvedilol 12.5 mg twice  daily Avoid triggers caffeine, alcohol, stimulants  Essential hypertension-BP today 148/90.  Well-controlled at home. Continue valsartan 80 mg tablet daily Continue chlorthalidone 25 mg tablet daily Continue carvedilol 12.5 mg twice daily Heart healthy low-sodium diet salty 6 given Maintain physical activity  Order BMP today  NICM-history of reduced EF with atrial fibrillation in the past.  Had resolved according to 08/2008 echocardiogram which showed an EF up to 55%. Continue carvedilol 12.5 mg twice daily Repeat echocardiogram in the future-patient wishes to hold off for now Encourage daily weights  Venous stasis dermatitis-bilateral +1 pitting edema today. Encourage the use of lower extremity support stockings Heart healthy low-sodium diet-salty 6 given Elevate extremities when not active Furosemide 20 mg daily as needed up to 3 times per week.  Instructed to contact office if using more often.  Exertional dyspnea-better since switch from ACE to ARB.  Patient's cough has gone away and his exercise tolerance is much better. Continue to monitor  Disposition: Follow-up with Dr. Ellyn Hack in 6 months.  Jossie Ng. [facility] [address] Office [phone] Fax [phone]

## 2019-09-01 ENCOUNTER — Ambulatory Visit: Payer: Medicare Other | Admitting: General Practice

## 2019-09-01 ENCOUNTER — Other Ambulatory Visit: Payer: Self-pay

## 2019-09-01 ENCOUNTER — Encounter: Payer: Self-pay | Admitting: General Practice

## 2019-09-01 VITALS — BP 148/90 | HR 69 | Ht 75.0 in | Wt 254.4 lb

## 2019-09-01 DIAGNOSIS — I10 Essential (primary) hypertension: Secondary | ICD-10-CM | POA: Diagnosis not present

## 2019-09-01 DIAGNOSIS — R0600 Dyspnea, unspecified: Secondary | ICD-10-CM | POA: Diagnosis not present

## 2019-09-01 DIAGNOSIS — Z79899 Other long term (current) drug therapy: Secondary | ICD-10-CM

## 2019-09-01 DIAGNOSIS — I872 Venous insufficiency (chronic) (peripheral): Secondary | ICD-10-CM

## 2019-09-01 DIAGNOSIS — I482 Chronic atrial fibrillation, unspecified: Secondary | ICD-10-CM

## 2019-09-01 DIAGNOSIS — R0609 Other forms of dyspnea: Secondary | ICD-10-CM

## 2019-09-01 DIAGNOSIS — I428 Other cardiomyopathies: Secondary | ICD-10-CM

## 2019-09-01 LAB — BASIC METABOLIC PANEL
BUN/Creatinine Ratio: 11 (ref 10–24)
BUN: 10 mg/dL (ref 8–27)
CO2: 28 mmol/L (ref 20–29)
Calcium: 10.3 mg/dL — ABNORMAL HIGH (ref 8.6–10.2)
Chloride: 91 mmol/L — ABNORMAL LOW (ref 96–106)
Creatinine, Ser: 0.89 mg/dL (ref 0.76–1.27)
GFR calc Af Amer: 99 mL/min/{1.73_m2} (ref >59)
GFR calc non Af Amer: 86 mL/min/{1.73_m2} (ref >59)
Glucose: 104 mg/dL — ABNORMAL HIGH (ref 65–99)
Potassium: 4.3 mmol/L (ref 3.5–5.2)
Sodium: 134 mmol/L (ref 134–144)

## 2019-09-01 MED ORDER — FUROSEMIDE 20 MG PO TABS: 20.0000 mg | ORAL_TABLET | Freq: Every day | ORAL | 3 refills | Status: DC | PRN

## 2019-09-01 NOTE — Patient Instructions (Signed)
Medication Instructions:  START LASIX 20MG  AS-NEEDED FOR SWELLING UP TO 3 TIMES A WEEK IF YOU ARE NEEDING IT 3 TIMES A WEEK CALL AND LET us KNOW. If you need a refill on your cardiac medications before your next appointment, please call your pharmacy.  Labwork: BMET TODAY HERE IN OUR OFFICE AT [facility]   You will need to fast. DO NOT EAT OR DRINK PAST MIDNIGHT.     You will NOT need to fast   If you have labs (blood work) drawn today and your tests are completely normal, you will receive your results only by: Marland Kitchen MyChart Message (if you have MyChart) OR . A paper copy in the mail If you have any lab test that is abnormal or we need to change your treatment, we will call you to review the results.  Follow-Up: IN 6 months Please call our office 2 months in advance, [facility] 2021 to schedule this MAY 2021 appointment. Either In Person or Virtual You may see Glenetta Hew, MD Avanell Shackleton or one of the following Advanced Practice Providers on your designated Care Team:  Rosaria Ferries, PA-C  Jory Sims, DNP, ANP  Cadence Kathlen Mody, NP.    At [facility], you and your health needs are our priority.  As part of our continuing mission to provide you with exceptional heart care, we have created designated Provider Care Teams.  These Care Teams include your primary Cardiologist (physician) and Advanced Practice Providers (APPs -  Physician Assistants and Nurse Practitioners) who all work together to provide you with the care you need, when you need it.  Thank you for choosing [facility] at [facility]!!

## 2019-09-16 ENCOUNTER — Other Ambulatory Visit: Payer: Self-pay

## 2019-09-16 DIAGNOSIS — N138 Other obstructive and reflux uropathy: Secondary | ICD-10-CM

## 2019-09-17 ENCOUNTER — Other Ambulatory Visit: Payer: Medicare Other

## 2019-09-17 ENCOUNTER — Other Ambulatory Visit: Payer: Self-pay

## 2019-09-17 DIAGNOSIS — N138 Other obstructive and reflux uropathy: Secondary | ICD-10-CM

## 2019-09-17 DIAGNOSIS — N401 Enlarged prostate with lower urinary tract symptoms: Secondary | ICD-10-CM

## 2019-09-18 LAB — PSA: Prostate Specific Ag, Serum: 0.4 ng/mL (ref 0.0–4.0)

## 2019-09-24 ENCOUNTER — Ambulatory Visit: Payer: Medicare Other | Admitting: Urology

## 2019-10-06 NOTE — Progress Notes (Signed)
10/07/2019  11:15 AM   [name] [birth year] [id number]  Referring provider: Birdie Sons, [address] [place],  [place] [address]  Chief Complaint  Patient presents with  . Benign Prostatic Hypertrophy   HPI: Patient is a 73 y.o. male presenting for his annual exam. He has a history of erectile dysfunction (s/p penile prothesis placement on 12/21/2013) and BPH with LUTS.  Erectile dysfunction Penile prothesis in place and functioning properly.    BPH WITH LUTS His I-PSS score today is 5, which is mild lower urinary tract symptomatology. He is mixed with his quality life due to his urinary symptoms. His previous I-PSS score was 8/2.  Patient denies any gross hematuria, dysuria or suprapubic/flank pain.  Patient denies any fevers, chills, nausea or vomiting.     Component     Latest Ref Rng & Units 09/23/2015 09/20/2016 09/18/2017 09/16/2018  Prostate Specific Ag, Serum     0.0 - 4.0 ng/mL 0.6 0.7 0.6 0.5   Component     Latest Ref Rng & Units 09/17/2019  Prostate Specific Ag, Serum     0.0 - 4.0 ng/mL 0.4   IPSS    Row Name 10/07/19 1100         International Prostate Symptom Score   How often have you had the sensation of not emptying your bladder?  Not at All     How often have you had to urinate less than every two hours?  Less than 1 in 5 times     How often have you found you stopped and started again several times when you urinated?  Not at All     How often have you found it difficult to postpone urination?  Less than 1 in 5 times     How often have you had a weak urinary stream?  Less than 1 in 5 times     How often have you had to strain to start urination?  Not at All     How many times did you typically get up at night to urinate?  2 Times     Total IPSS Score  5       Quality of Life due to urinary symptoms   If you were to spend the rest of your life with your urinary condition just the way it is now how would you feel about that?   Mixed       PMH: Past Medical History:  Diagnosis Date  . BPH with obstruction/lower urinary tract symptoms   . Chronic atrial fibrillation ([place])   . Dislocation of shoulder, anterior, right, closed 07/24/2014  . Echocardiogram abnormal    2009 moderate to severely dilated left atrium  . Erectile dysfunction   . Essential hypertension   . Gout   . History of chicken pox   . History of measles   . Hyperglycemia   . Hypogonadism in male   . Nocturia   . Peyronie's disease   . Traumatic tear of right rotator cuff 07/24/2014  . Venous stasis    chronic   Surgical History: Past Surgical History:  Procedure Laterality Date  . CARDIAC CATHETERIZATION  03/28/2004   normal coronaries, reduced EF at 25-30% (Dr. Gerrie Nordmann)  . CARDIOVERSION, TEE guided  03/29/2004   Dr. Gerrie Nordmann   . COLONOSCOPY  05/28/2013   Dr. Rayann Heman  . HERNIA REPAIR  age 54  . KNEE SURGERY    . LUMBAR FUSION  age 78  .  MOHS SURGERY Right 08/20/2019   ALA done by Ishmael Holter, MD [facility]  . MYOVIEW CARDIOVASCULAR STRESS TEST  07/2003   anterior wall thinning, LV systolic function depressed at 33%  . PILONIDAL CYST EXCISION    . Right Eyebrow Tumor Removed  age 67   . SHOULDER ARTHROSCOPY WITH ROTATOR CUFF REPAIR AND SUBACROMIAL DECOMPRESSION Right 08/19/2014   Procedure: SHOULDER ARTHROSCOPY WITH ROTATOR CUFF REPAIR AND SUBACROMIAL DECOMPRESSION;  Surgeon: Lorn Junes, MD;  Location: [place];  Service: Orthopedics;  Laterality: Right;  . TOTAL HIP ARTHROPLASTY Right 03/25/2006   Dr. Wanda Plump. [place]  . TRANSTHORACIC ECHOCARDIOGRAM  08/2008   EF=>55%, mild-mod dilated; LA mod-severely dilated; RA mild-mod dilated; mild calcif of MV, borderline MVP, trace MR; trace TR; mild pulm valve regurg    Home Medications:  Allergies as of 10/07/2019      Reactions   Benazepril    COUGH       Medication List       Accurate as of October 07, 2019 11:15 AM. If you have any questions, ask your nurse or  doctor.        carvedilol 12.5 MG tablet Commonly known as: COREG TAKE 1 TABLET BY MOUTH TWICE A DAY   chlorthalidone 25 MG tablet Commonly known as: HYGROTON Take 1 tablet (25 mg total) by mouth daily. In the morning   furosemide 20 MG tablet Commonly known as: LASIX Take 1 tablet (20 mg total) by mouth daily as needed for edema (UP TO 3X QWEEK ONLY).   multivitamin tablet Take 1 tablet by mouth daily.   valsartan 80 MG tablet Commonly known as: Diovan Take 1 tablet (80 mg total) by mouth daily. In the evening.   Xarelto 20 MG Tabs tablet Generic drug: rivaroxaban TAKE 1 TABLET BY MOUTH ONCE DAILY WITH SUPPER      Allergies:  Allergies  Allergen Reactions  . Benazepril     COUGH     Family History: Family History  Problem Relation Age of Onset  . Hypertension Father   . Prostate cancer Father   . Bladder Cancer Father   . Hypertension Mother   . Stroke Maternal Grandfather   . Diabetes Paternal Grandfather   . Supraventricular tachycardia Brother   . Arthritis Sister        RA  . Diabetes Maternal Uncle        TYPE 2  . Supraventricular tachycardia Child   . Kidney disease Neg Hx   . Heart attack Neg Hx   . Colon cancer Neg Hx    Social History:  reports that he has never smoked. He has never used smokeless tobacco. He reports current alcohol use. He reports that he does not use drugs.  ROS: UROLOGY Frequent Urination?: No Hard to postpone urination?: No Burning/pain with urination?: No Get up at night to urinate?: No Leakage of urine?: No Urine stream starts and stops?: No Trouble starting stream?: No Do you have to strain to urinate?: No Blood in urine?: No Urinary tract infection?: No Sexually transmitted disease?: No Injury to kidneys or bladder?: No Painful intercourse?: No Weak stream?: No Erection problems?: No Penile pain?: No  Gastrointestinal Nausea?: No Vomiting?: No Indigestion/heartburn?: No Diarrhea?: No Constipation?:  No  Constitutional Fever: No Night sweats?: Yes Weight loss?: No Fatigue?: No  Skin Skin rash/lesions?: No Itching?: No  Eyes Blurred vision?: No Double vision?: No  Ears/Nose/Throat Sore throat?: No Sinus problems?: No  Hematologic/Lymphatic Swollen glands?: No Easy bruising?:  No  Cardiovascular Leg swelling?: No Chest pain?: No  Respiratory Cough?: No Shortness of breath?: No  Endocrine Excessive thirst?: No  Musculoskeletal Back pain?: No Joint pain?: No  Neurological Headaches?: No Dizziness?: No  Psychologic Depression?: No Anxiety?: No  Physical Exam: BP (!) 167/107   Pulse 75   Ht 6\' 3"  (1.905 m)   Wt 243 lb 14.4 oz (110.6 kg)   BMI 30.49 kg/m   Constitutional:  Well nourished. Alert and oriented, No acute distress. HEENT: [place] AT, moist mucus membranes.  Trachea midline, no masses. Cardiovascular: No clubbing, cyanosis, or edema. Respiratory: Normal respiratory effort, no increased work of breathing. GI: Abdomen is soft, non tender, non distended, no abdominal masses. Liver and spleen not palpable.  No hernias appreciated.  Stool sample for occult testing is not indicated.   GU: No CVA tenderness.  No bladder fullness or masses.  Patient with circumcised phallus.  Urethral meatus is patent.  No penile discharge. No penile lesions or rashes.  Prothesis cylinders in place.  Scrotum without lesions, cysts, rashes and/or edema.  Prothesis pump in place.  Testicles are located scrotally bilaterally. No masses are appreciated in the testicles. Left and right epididymis are normal. Rectal: Patient with  normal sphincter tone. Anus and perineum without scarring or rashes. No rectal masses are appreciated. Prostate is approximately 45 grams, could only palpate the apex and the midportion of the gland, no nodules are appreciated. Seminal vesicles could not be palpated.   Skin: No rashes, bruises or suspicious lesions. Lymph: No inguinal  adenopathy. Neurologic: Grossly intact, no focal deficits, moving all 4 extremities. Psychiatric: Normal mood and affect.  Laboratory Data: Lab Results  Component Value Date   WBC 6.8 10/06/2018   HGB 14.3 10/06/2018   HCT 40.4 10/06/2018   MCV 94 10/06/2018   PLT 300 10/06/2018   Lab Results  Component Value Date   CREATININE 0.89 09/01/2019   PSA History  0.7 ng/mL on 09/21/2013  0.5 ng/mL on 02/11/2014  0.7 ng/mL on 09/24/2014  0.6 ng/mL on 09/23/2015  0.7 ng/mL on 09/20/2016  0.6 ng/mL on 09/18/2017  0.5 ng/mL on 09/16/2018  I have reviewed the labs.  Assessment & Plan:    1. BPH with LUTS PSA is 0.4, it is stable IPSS score is 5/3, it is improved Continue conservative management, avoiding bladder irritants and timed voiding's RTC in 12 months for IPSS, PSA and exam   2.  ED Prothesis is working properly and is still satisfied   Return in about 1 year (around 10/06/2020) for IPSS, PSA and exam.  Laneta Simmers  [place] [address] [place], [place] [address] [phone]

## 2019-10-07 ENCOUNTER — Encounter: Payer: Self-pay | Admitting: Urology

## 2019-10-07 ENCOUNTER — Other Ambulatory Visit: Payer: Self-pay

## 2019-10-07 ENCOUNTER — Ambulatory Visit: Payer: Medicare Other | Admitting: Urology

## 2019-10-07 VITALS — BP 167/107 | HR 75 | Ht 75.0 in | Wt 243.9 lb

## 2019-10-07 DIAGNOSIS — N138 Other obstructive and reflux uropathy: Secondary | ICD-10-CM | POA: Diagnosis not present

## 2019-10-07 DIAGNOSIS — N401 Enlarged prostate with lower urinary tract symptoms: Secondary | ICD-10-CM

## 2019-10-07 DIAGNOSIS — N529 Male erectile dysfunction, unspecified: Secondary | ICD-10-CM

## 2019-10-14 NOTE — Progress Notes (Signed)
Subjective:   [name] is a 74 y.o. male who presents for Medicare Annual/Subsequent preventive examination.    This visit is being conducted through telemedicine due to the COVID-19 pandemic. This patient has given me verbal consent via doximity to conduct this visit, patient states they are participating from their home address. Some vital signs may be absent or patient reported.    Patient identification: identified by name, DOB, and current address  Review of Systems:  [name]  Cardiac Risk Factors include: advanced age (>66men, >57 women);hypertension;male gender     Objective:    Vitals: There were no vitals taken for this visit.  There is no height or weight on file to calculate BMI. Unable to obtain vitals due to visit being conducted via telephonically.   Advanced Directives 10/15/2019 10/03/2017 05/30/2015 08/19/2014 08/17/2014 07/23/2014  Does Patient Have a Medical Advance Directive? Yes No No No Yes No  Type of Paramedic of [place];Living will - - - Living will -  Does patient want to make changes to medical advance directive? - - - - No - Patient declined -  Copy of [place] in Chart? No - copy requested - - - - -  Would patient like information on creating a medical advance directive? - No - Patient declined No - patient declined information - - No - patient declined information    Tobacco Social History   Tobacco Use  Smoking Status Never Smoker  Smokeless Tobacco Never Used     Counseling given: Not Answered   Clinical Intake:  Pre-visit preparation completed: Yes  Pain : No/denies pain Pain Score: 0-No pain     Nutritional Risks: None Diabetes: No  How often do you need to have someone help you when you read instructions, pamphlets, or other written materials from your doctor or pharmacy?: 1 - Never  Interpreter Needed?: No  Information entered by :: [facility], LPN  Past Medical History:    Diagnosis Date  . BPH with obstruction/lower urinary tract symptoms   . Chronic atrial fibrillation ([place])   . Dislocation of shoulder, anterior, right, closed 07/24/2014  . Echocardiogram abnormal    2009 moderate to severely dilated left atrium  . Erectile dysfunction   . Essential hypertension   . Gout   . History of chicken pox   . History of measles   . Hyperglycemia   . Hypogonadism in male   . Nocturia   . Peyronie's disease   . Traumatic tear of right rotator cuff 07/24/2014  . Venous stasis    chronic   Past Surgical History:  Procedure Laterality Date  . CARDIAC CATHETERIZATION  03/28/2004   normal coronaries, reduced EF at 25-30% (Dr. Gerrie Nordmann)  . CARDIOVERSION, TEE guided  03/29/2004   Dr. Gerrie Nordmann   . COLONOSCOPY  05/28/2013   Dr. Rayann Heman  . HERNIA REPAIR  age 8  . KNEE SURGERY    . LUMBAR FUSION  age 80  . MOHS SURGERY Right 08/20/2019   ALA done by Ishmael Holter, MD [facility]  . MYOVIEW CARDIOVASCULAR STRESS TEST  07/2003   anterior wall thinning, LV systolic function depressed at 33%  . PILONIDAL CYST EXCISION    . Right Eyebrow Tumor Removed  age 56   . SHOULDER ARTHROSCOPY WITH ROTATOR CUFF REPAIR AND SUBACROMIAL DECOMPRESSION Right 08/19/2014   Procedure: SHOULDER ARTHROSCOPY WITH ROTATOR CUFF REPAIR AND SUBACROMIAL DECOMPRESSION;  Surgeon: Lorn Junes, MD;  Location: [facility];  Service: Orthopedics;  Laterality: Right;  . TOTAL HIP ARTHROPLASTY Right 03/25/2006   Dr. Wanda Plump. [place]  . TRANSTHORACIC ECHOCARDIOGRAM  08/2008   EF=>55%, mild-mod dilated; LA mod-severely dilated; RA mild-mod dilated; mild calcif of MV, borderline MVP, trace MR; trace TR; mild pulm valve regurg    Family History  Problem Relation Age of Onset  . Hypertension Father   . Prostate cancer Father   . Bladder Cancer Father   . Hypertension Mother   . Stroke Maternal Grandfather   . Diabetes Paternal Grandfather   . Supraventricular tachycardia Brother   .  Arthritis Sister        RA  . Diabetes Maternal Uncle        TYPE 2  . Supraventricular tachycardia Child   . Kidney disease Neg Hx   . Heart attack Neg Hx   . Colon cancer Neg Hx    Social History   Socioeconomic History  . Marital status: Single    Spouse name: Not on file  . Number of children: 1  . Years of education: Not on file  . Highest education level: Master's degree (e.g., MA, MS, MEng, MEd, MSW, MBA)  Occupational History  . Occupation: retired Pharmacist, hospital, taught PE and coached  Tobacco Use  . Smoking status: Never Smoker  . Smokeless tobacco: Never Used  Substance and Sexual Activity  . Alcohol use: Yes    Alcohol/week: 4.0 standard drinks    Types: 4 Cans of beer per week  . Drug use: No  . Sexual activity: Not on file  Other Topics Concern  . Not on file  Social History Narrative   Divorced father of 1. Retired Education officer, museum.   ~2 beers / day. Does not smoke.   Works out 6 days a weeks - cardio & weights. He used to run and play basketball prior to his hip surgery in 2007.   Currently dating.   Social Determinants of Health   Financial Resource Strain: Low Risk   . Difficulty of Paying Living Expenses: Not hard at all  Food Insecurity: No Food Insecurity  . Worried About Charity fundraiser in the Last Year: Never true  . Ran Out of Food in the Last Year: Never true  Transportation Needs: No Transportation Needs  . Lack of Transportation (Medical): No  . Lack of Transportation (Non-Medical): No  Physical Activity: Insufficiently Active  . Days of Exercise per Week: 3 days  . Minutes of Exercise per Session: 40 min  Stress: No Stress Concern Present  . Feeling of Stress : Not at all  Social Connections: Moderately Isolated  . Frequency of Communication with Friends and Family: Twice a week  . Frequency of Social Gatherings with Friends and Family: More than three times a week  . Attends Religious Services: Never  . Active Member of Clubs or  Organizations: No  . Attends Archivist Meetings: Never  . Marital Status: Divorced    Outpatient Encounter Medications as of 10/15/2019  Medication Sig  . carvedilol (COREG) 12.5 MG tablet TAKE 1 TABLET BY MOUTH TWICE A DAY  . chlorthalidone (HYGROTON) 25 MG tablet Take 1 tablet (25 mg total) by mouth daily. In the morning  . furosemide (LASIX) 20 MG tablet Take 1 tablet (20 mg total) by mouth daily as needed for edema (UP TO 3X QWEEK ONLY).  . Multiple Vitamin (MULTIVITAMIN) tablet Take 1 tablet by mouth daily.    . valsartan (DIOVAN) 80 MG tablet  Take 1 tablet (80 mg total) by mouth daily. In the evening.  Alveda Reasons 20 MG TABS tablet TAKE 1 TABLET BY MOUTH ONCE DAILY WITH SUPPER   No facility-administered encounter medications on file as of 10/15/2019.    Activities of Daily Living In your present state of health, do you have any difficulty performing the following activities: 10/15/2019  Hearing? N  Vision? N  Difficulty concentrating or making decisions? N  Walking or climbing stairs? Y  Comment Due to a-fib and causing SOB.  Dressing or bathing? N  Doing errands, shopping? N  Preparing Food and eating ? N  Using the Toilet? N  In the past six months, have you accidently leaked urine? N  Do you have problems with loss of bowel control? N  Managing your Medications? N  Managing your Finances? N  Housekeeping or managing your Housekeeping? N  Some recent data might be hidden    Patient Care Team: Birdie Sons, MD as PCP - General (Family Medicine) Leonie Man, MD as PCP - Cardiology (Cardiology) Dasher, Rayvon Char, MD (Dermatology) Laneta Simmers as Physician Assistant (Urology)   Assessment:   This is a routine wellness examination for [place].  Exercise Activities and Dietary recommendations Current Exercise Habits: Structured exercise class, Type of exercise: strength training/weights;walking, Time (Minutes): 45, Frequency (Times/Week): 3(to 4  days), Weekly Exercise (Minutes/Week): 135, Intensity: Moderate, Exercise limited by: None identified  Goals    . DIET - INCREASE WATER INTAKE     Recommend increasing water intake to 3-4 glasses a day.        Fall Risk: Fall Risk  10/15/2019 10/06/2018 10/03/2017 09/26/2016 09/26/2015  Falls in the past year? 1 1 No No Yes  Number falls in past yr: 0 1 - - 1  Injury with Fall? 0 0 - - No  Follow up - Falls prevention discussed - - Falls evaluation completed    FALL RISK PREVENTION PERTAINING TO THE HOME:  Any stairs in or around the home? Yes  If so, are there any without handrails? No   Home free of loose throw rugs in walkways, pet beds, electrical cords, etc? Yes  Adequate lighting in your home to reduce risk of falls? Yes   ASSISTIVE DEVICES UTILIZED TO PREVENT FALLS:  Life alert? Yes  Use of a cane, walker or w/c? No  Grab bars in the bathroom? No  Shower chair or bench in shower? No  Elevated toilet seat or a handicapped toilet? Yes   TIMED UP AND GO:  Was the test performed? No .    Depression Screen PHQ 2/9 Scores 10/15/2019 10/15/2019 10/06/2018 10/03/2017  PHQ - 2 Score 0 0 0 0  PHQ- 9 Score - - 0 0    Cognitive Function: Declined today.         Immunization History  Administered Date(s) Administered  . Fluad Quad(high Dose 65+) 06/22/2019  . Influenza-Unspecified 06/25/2017, 06/22/2018  . Pneumococcal Conjugate-13 09/20/2014  . Pneumococcal Polysaccharide-23 09/26/2015  . Tdap 03/27/2011  . Zoster 03/24/2010  . Zoster Recombinat (Shingrix) 10/14/2018, 12/22/2018    Qualifies for Shingles Vaccine? Completed series  Tdap: Up to date  Flu Vaccine: Up to date  Pneumococcal Vaccine: Completed series  Screening Tests Health Maintenance  Topic Date Due  . TETANUS/TDAP  03/26/2021  . COLONOSCOPY  05/29/2023  . INFLUENZA VACCINE  Completed  . Hepatitis C Screening  Completed  . PNA vac Low Risk Adult  Completed   Cancer  Screenings:  Colorectal Screening: Completed 05/28/13. Repeat every 10 years.   Lung Cancer Screening: (Low Dose CT Chest recommended if Age 74-80 years, 30 pack-year currently smoking OR have quit w/in 15years.) does not qualify.   Additional Screening:  Hepatitis C Screening: Up to date  Vision Screening: Recommended annual ophthalmology exams for early detection of glaucoma and other disorders of the eye.  Dental Screening: Recommended annual dental exams for proper oral hygiene  Community Resource Referral:  CRR required this visit?  No        Plan:  I have personally reviewed and addressed the Medicare Annual Wellness questionnaire and have noted the following in the patient's chart:  A. Medical and social history B. Use of alcohol, tobacco or illicit drugs  C. Current medications and supplements D. Functional ability and status E.  Nutritional status F.  Physical activity G. Advance directives H. List of other physicians I.  Hospitalizations, surgeries, and ER visits in previous 12 months J.  [place] such as hearing and vision if needed, cognitive and depression L. Referrals and appointments   In addition, I have reviewed and discussed with patient certain preventive protocols, quality metrics, and best practice recommendations. A written personalized care plan for preventive services as well as general preventive health recommendations were provided to patient.   Glendora Score, LPN  [id number] Nurse Health Advisor   Nurse Notes: None.

## 2019-10-15 ENCOUNTER — Encounter: Payer: Medicare Other | Admitting: Family Medicine

## 2019-10-15 ENCOUNTER — Ambulatory Visit (INDEPENDENT_AMBULATORY_CARE_PROVIDER_SITE_OTHER): Payer: Medicare PPO

## 2019-10-15 ENCOUNTER — Other Ambulatory Visit: Payer: Self-pay

## 2019-10-15 DIAGNOSIS — Z0000 Encounter for general adult medical examination without abnormal findings: Secondary | ICD-10-CM | POA: Diagnosis not present

## 2019-10-15 NOTE — Patient Instructions (Addendum)
[name] , Thank you for taking time to come for your Medicare Wellness Visit. I appreciate your ongoing commitment to your health goals. Please review the following plan we discussed and let me know if I can assist you in the future.   Screening recommendations/referrals: Colonoscopy: Up to date, due 05/2023 Recommended yearly ophthalmology/optometry visit for glaucoma screening and checkup Recommended yearly dental visit for hygiene and checkup  Vaccinations: Influenza vaccine: Up to date Pneumococcal vaccine: Completed series Tdap vaccine: Up to date, due 72/2022 Shingles vaccine: Completed series    Advanced directives: Please bring a copy of your POA (Power of [name]) and/or Living Will to your next appointment.   Conditions/risks identified: Recommend to increase water intake to 6-8 8 oz glasses a day.   Next appointment: 11/18/19 @ 9:00 AM with Dr Caryn Section. Declined scheduling an AWV for 2022 at this time.   Preventive Care 72 Years and Older, Male Preventive care refers to lifestyle choices and visits with your health care provider that can promote health and wellness. What does preventive care include?  A yearly physical exam. This is also called an annual well check.  Dental exams once or twice a year.  Routine eye exams. Ask your health care provider how often you should have your eyes checked.  Personal lifestyle choices, including:  Daily care of your teeth and gums.  Regular physical activity.  Eating a healthy diet.  Avoiding tobacco and drug use.  Limiting alcohol use.  Practicing safe sex.  Taking low doses of aspirin every day.  Taking vitamin and mineral supplements as recommended by your health care provider. What happens during an annual well check? The services and screenings done by your health care provider during your annual well check will depend on your age, overall health, lifestyle risk factors, and family history of disease. Counseling    Your health care provider may ask you questions about your:  Alcohol use.  Tobacco use.  Drug use.  Emotional well-being.  Home and relationship well-being.  Sexual activity.  Eating habits.  History of falls.  Memory and ability to understand (cognition).  Work and work Statistician. Screening  You may have the following tests or measurements:  Height, weight, and BMI.  Blood pressure.  Lipid and cholesterol levels. These may be checked every 5 years, or more frequently if you are over 55 years old.  Skin check.  Lung cancer screening. You may have this screening every year starting at age 72 if you have a 30-pack-year history of smoking and currently smoke or have quit within the past 15 years.  Fecal occult blood test (FOBT) of the stool. You may have this test every year starting at age 72.  Flexible sigmoidoscopy or colonoscopy. You may have a sigmoidoscopy every 5 years or a colonoscopy every 10 years starting at age 72.  Prostate cancer screening. Recommendations will vary depending on your family history and other risks.  Hepatitis C blood test.  Hepatitis B blood test.  Sexually transmitted disease (STD) testing.  Diabetes screening. This is done by checking your blood sugar (glucose) after you have not eaten for a while (fasting). You may have this done every 1-3 years.  Abdominal aortic aneurysm (AAA) screening. You may need this if you are a current or former smoker.  Osteoporosis. You may be screened starting at age 72 if you are at high risk. Talk with your health care provider about your test results, treatment options, and if necessary, the need for  more tests. Vaccines  Your health care provider may recommend certain vaccines, such as:  Influenza vaccine. This is recommended every year.  Tetanus, diphtheria, and acellular pertussis (Tdap, Td) vaccine. You may need a Td booster every 10 years.  Zoster vaccine. You may need this after age  77.  Pneumococcal 13-valent conjugate (PCV13) vaccine. One dose is recommended after age 60.  Pneumococcal polysaccharide (PPSV23) vaccine. One dose is recommended after age 45. Talk to your health care provider about which screenings and vaccines you need and how often you need them. This information is not intended to replace advice given to you by your health care provider. Make sure you discuss any questions you have with your health care provider. Document Released: 10/21/2015 Document Revised: 06/13/2016 Document Reviewed: 07/26/2015 Elsevier Interactive Patient Education  2017 [employer] Prevention in the Home Falls can cause injuries. They can happen to people of all ages. There are many things you can do to make your home safe and to help prevent falls. What can I do on the outside of my home?  Regularly fix the edges of walkways and driveways and fix any cracks.  Remove anything that might make you trip as you walk through a door, such as a raised step or threshold.  Trim any bushes or trees on the path to your home.  Use bright outdoor lighting.  Clear any walking paths of anything that might make someone trip, such as rocks or tools.  Regularly check to see if handrails are loose or broken. Make sure that both sides of any steps have handrails.  Any raised decks and porches should have guardrails on the edges.  Have any leaves, snow, or ice cleared regularly.  Use sand or salt on walking paths during winter.  Clean up any spills in your garage right away. This includes oil or grease spills. What can I do in the bathroom?  Use night lights.  Install grab bars by the toilet and in the tub and shower. Do not use towel bars as grab bars.  Use non-skid mats or decals in the tub or shower.  If you need to sit down in the shower, use a plastic, non-slip stool.  Keep the floor dry. Clean up any water that spills on the floor as soon as it happens.  Remove  soap buildup in the tub or shower regularly.  Attach bath mats securely with double-sided non-slip rug tape.  Do not have throw rugs and other things on the floor that can make you trip. What can I do in the bedroom?  Use night lights.  Make sure that you have a light by your bed that is easy to reach.  Do not use any sheets or blankets that are too big for your bed. They should not hang down onto the floor.  Have a firm chair that has side arms. You can use this for support while you get dressed.  Do not have throw rugs and other things on the floor that can make you trip. What can I do in the kitchen?  Clean up any spills right away.  Avoid walking on wet floors.  Keep items that you use a lot in easy-to-reach places.  If you need to reach something above you, use a strong step stool that has a grab bar.  Keep electrical cords out of the way.  Do not use floor polish or wax that makes floors slippery. If you must use wax, use non-skid  floor wax.  Do not have throw rugs and other things on the floor that can make you trip. What can I do with my stairs?  Do not leave any items on the stairs.  Make sure that there are handrails on both sides of the stairs and use them. Fix handrails that are broken or loose. Make sure that handrails are as long as the stairways.  Check any carpeting to make sure that it is firmly attached to the stairs. Fix any carpet that is loose or worn.  Avoid having throw rugs at the top or bottom of the stairs. If you do have throw rugs, attach them to the floor with carpet tape.  Make sure that you have a light switch at the top of the stairs and the bottom of the stairs. If you do not have them, ask someone to add them for you. What else can I do to help prevent falls?  Wear shoes that:  Do not have high heels.  Have rubber bottoms.  Are comfortable and fit you well.  Are closed at the toe. Do not wear sandals.  If you use a  stepladder:  Make sure that it is fully opened. Do not climb a closed stepladder.  Make sure that both sides of the stepladder are locked into place.  Ask someone to hold it for you, if possible.  Clearly mark and make sure that you can see:  Any grab bars or handrails.  First and last steps.  Where the edge of each step is.  Use tools that help you move around (mobility aids) if they are needed. These include:  Canes.  Walkers.  Scooters.  Crutches.  Turn on the lights when you go into a dark area. Replace any light bulbs as soon as they burn out.  Set up your furniture so you have a clear path. Avoid moving your furniture around.  If any of your floors are uneven, fix them.  If there are any pets around you, be aware of where they are.  Review your medicines with your doctor. Some medicines can make you feel dizzy. This can increase your chance of falling. Ask your doctor what other things that you can do to help prevent falls. This information is not intended to replace advice given to you by your health care provider. Make sure you discuss any questions you have with your health care provider. Document Released: 07/21/2009 Document Revised: 03/01/2016 Document Reviewed: 10/29/2014 Elsevier Interactive Patient Education  2017 [employer].

## 2019-11-03 ENCOUNTER — Other Ambulatory Visit: Payer: Self-pay | Admitting: Cardiology

## 2019-11-03 DIAGNOSIS — I10 Essential (primary) hypertension: Secondary | ICD-10-CM

## 2019-11-17 NOTE — Progress Notes (Signed)
Patient: [name], Male    DOB: 1945-11-15, 74 y.o.   MRN: [id number] Visit Date: 11/18/2019  Today's Provider: [name], MD   Chief Complaint  Patient presents with  . Annual Exam   Subjective:     Patient had a AWE with [facility] on 10/15/2019   Annual physical exam [name] is a 74 y.o. male who presents today for health maintenance and complete physical. He feels well. He reports exercising 3-4 times weekly. He reports he is sleeping well.  -----------------------------------------------------------------  Hypertension, follow-up:  BP Readings from Last 3 Encounters:  11/18/19 122/70  10/07/19 (!) 167/107  09/01/19 (!) 148/90    He was last seen for hypertension 1 years ago.  BP at that visit was 126/86. Management changes since that visit include no change. He reports good compliance with treatment. He is not having side effects.  He is exercising. He is adherent to low salt diet.   Outside blood pressures are not checked. He is experiencing none.  Patient denies chest pain, chest pressure/discomfort, irregular heart beat and palpitations.   Cardiovascular risk factors include advanced age (older than 46 for men, 26 for women), hypertension and male gender.  Use of agents associated with hypertension: none.     Weight trend: stable Wt Readings from Last 3 Encounters:  11/18/19 253 lb (114.8 kg)  10/07/19 243 lb 14.4 oz (110.6 kg)  09/01/19 254 lb 6.4 oz (115.4 kg)    Current diet: well balanced  ------------------------------------------------------------------------   Hyperglycemia, Follow-up:   Lab Results  Component Value Date   HGBA1C 5.6 10/06/2018   HGBA1C 5.6 09/20/2014   GLUCOSE 104 (H) 09/01/2019   GLUCOSE 118 (H) 03/03/2019   GLUCOSE 118 (H) 02/24/2019    Last seen for for this more than 1 year ago.  Management since then includes no changes. Current symptoms include none and have been stable.  Weight trend:  increasing steadily Prior visit with dietician: no Current diet: well balanced Current exercise: structured class and weight lifting  Pertinent Labs:    Component Value Date/Time   CHOL 173 03/03/2019 1154   CHOL 166 10/06/2018 0958   TRIG 91 03/03/2019 1154   TRIG 65 01/06/2009 0000   CHOLHDL 3.9 03/03/2019 1154   CREATININE 0.89 09/01/2019 1009    Wt Readings from Last 3 Encounters:  11/18/19 253 lb (114.8 kg)  10/07/19 243 lb 14.4 oz (110.6 kg)  09/01/19 254 lb 6.4 oz (115.4 kg)   He continues to follow up regularly with Dr. Ellyn Hack for atrial fibrillation gets a little short of breath when he climbs stairs. Is doing well with Xarelto with no abnormal bleeding or bruising.   He is also due for follow up of previously ultrasound showing left renal cyst and hepatic steatosis. He does still drink a couple of beers 2-3 nights a week. Denies any abdominal pain.    Review of Systems  Constitutional: Negative.  Negative for appetite change, chills, fatigue and fever.  HENT: Negative.  Negative for congestion, ear pain, hearing loss, nosebleeds and trouble swallowing.   Eyes: Negative.  Negative for pain and visual disturbance.  Respiratory: Negative.  Negative for cough, chest tightness and shortness of breath.   Cardiovascular: Negative.  Negative for chest pain, palpitations and leg swelling.  Gastrointestinal: Negative.  Negative for abdominal pain, blood in stool, constipation, diarrhea, nausea and vomiting.  Endocrine: Negative.  Negative for polydipsia, polyphagia and polyuria.  Genitourinary: Negative.  Negative  for dysuria and flank pain.  Musculoskeletal: Negative.  Negative for arthralgias, back pain, joint swelling, myalgias and neck stiffness.  Skin: Negative.  Negative for color change, rash and wound.  Allergic/Immunologic: Negative.   Neurological: Negative.  Negative for dizziness, tremors, seizures, speech difficulty, weakness, light-headedness and headaches.    Hematological: Negative.   Psychiatric/Behavioral: Negative.  Negative for behavioral problems, confusion, decreased concentration, dysphoric mood and sleep disturbance. The patient is not nervous/anxious.   All other systems reviewed and are negative.   Social History      He  reports that he has never smoked. He has never used smokeless tobacco. He reports current alcohol use of about 4.0 standard drinks of alcohol per week. He reports that he does not use drugs.       Social History   Socioeconomic History  . Marital status: Single    Spouse name: Not on file  . Number of children: 1  . Years of education: Not on file  . Highest education level: Master's degree (e.g., MA, MS, MEng, MEd, MSW, MBA)  Occupational History  . Occupation: retired Pharmacist, hospital, taught PE and coached  Tobacco Use  . Smoking status: Never Smoker  . Smokeless tobacco: Never Used  Substance and Sexual Activity  . Alcohol use: Yes    Alcohol/week: 4.0 standard drinks    Types: 4 Cans of beer per week  . Drug use: No  . Sexual activity: Not on file  Other Topics Concern  . Not on file  Social History Narrative   Divorced father of 1. Retired Education officer, museum.   ~2 beers / day. Does not smoke.   Works out 6 days a weeks - cardio & weights. He used to run and play basketball prior to his hip surgery in 2007.   Currently dating.   Social Determinants of Health   Financial Resource Strain: Low Risk   . Difficulty of Paying Living Expenses: Not hard at all  Food Insecurity: No Food Insecurity  . Worried About Charity fundraiser in the Last Year: Never true  . Ran Out of Food in the Last Year: Never true  Transportation Needs: No Transportation Needs  . Lack of Transportation (Medical): No  . Lack of Transportation (Non-Medical): No  Physical Activity: Insufficiently Active  . Days of Exercise per Week: 3 days  . Minutes of Exercise per Session: 40 min  Stress: No Stress Concern Present  . Feeling of  Stress : Not at all  Social Connections: Moderately Isolated  . Frequency of Communication with Friends and Family: Twice a week  . Frequency of Social Gatherings with Friends and Family: More than three times a week  . Attends Religious Services: Never  . Active Member of Clubs or Organizations: No  . Attends Archivist Meetings: Never  . Marital Status: Divorced    Past Medical History:  Diagnosis Date  . BPH with obstruction/lower urinary tract symptoms   . Chronic atrial fibrillation ([place])   . Dislocation of shoulder, anterior, right, closed 07/24/2014  . Echocardiogram abnormal    2009 moderate to severely dilated left atrium  . Erectile dysfunction   . Essential hypertension   . Gout   . History of chicken pox   . History of measles   . Hyperglycemia   . Hypogonadism in male   . Nocturia   . Peyronie's disease   . Traumatic tear of right rotator cuff 07/24/2014  . Venous stasis    chronic  Patient Active Problem List   Diagnosis Date Noted  . History of basal cell carcinoma (BCC) 08/24/2019  . Exertional dyspnea 02/24/2019  . Cough-new onset, nonproductive 02/23/2019  . Complex renal cyst, left 04/22/2017  . Fatty liver 04/22/2017  . NICM (nonischemic cardiomyopathy) ([place]) 08/21/2016  . Exercise intolerance 12/09/2015  . BPH with obstruction/lower urinary tract symptoms 09/27/2015  . Erectile dysfunction of organic origin 09/27/2015  . Arthralgia of right hand 09/23/2015  . Basal cell carcinoma of right lower extremity 03/24/2015  . Hyperglycemia 08/16/2014  . Dislocation of shoulder, anterior, right, closed 07/24/2014  . Traumatic tear of right rotator cuff 07/24/2014  . Venous stasis dermatitis 11/02/2013  . Long term current use of anticoagulant therapy 12/24/2012  . Gouty arthropathy 03/24/2010  . Peripheral vascular disease ([place]) 12/19/2009  . Essential hypertension 11/18/2009  . Chronic atrial fibrillation (HCC) - CHA2DS2-VASc Score 3; On  Xarelto 11/18/2009    Class: Chronic    Past Surgical History:  Procedure Laterality Date  . CARDIAC CATHETERIZATION  03/28/2004   normal coronaries, reduced EF at 25-30% (Dr. Gerrie Nordmann)  . CARDIOVERSION, TEE guided  03/29/2004   Dr. Gerrie Nordmann   . COLONOSCOPY  05/28/2013   Dr. Rayann Heman  . HERNIA REPAIR  age 34  . KNEE SURGERY    . LUMBAR FUSION  age 80  . MOHS SURGERY Right 08/20/2019   ALA done by Ishmael Holter, MD [facility]  . MYOVIEW CARDIOVASCULAR STRESS TEST  07/2003   anterior wall thinning, LV systolic function depressed at 33%  . PILONIDAL CYST EXCISION    . Right Eyebrow Tumor Removed  age 48   . SHOULDER ARTHROSCOPY WITH ROTATOR CUFF REPAIR AND SUBACROMIAL DECOMPRESSION Right 08/19/2014   Procedure: SHOULDER ARTHROSCOPY WITH ROTATOR CUFF REPAIR AND SUBACROMIAL DECOMPRESSION;  Surgeon: Lorn Junes, MD;  Location: [place];  Service: Orthopedics;  Laterality: Right;  . TOTAL HIP ARTHROPLASTY Right 03/25/2006   Dr. Wanda Plump. [place]  . TRANSTHORACIC ECHOCARDIOGRAM  08/2008   EF=>55%, mild-mod dilated; LA mod-severely dilated; RA mild-mod dilated; mild calcif of MV, borderline MVP, trace MR; trace TR; mild pulm valve regurg     Family History        Family Status  Relation Name Status  . Father  Deceased at age 37  . Mother  Alive       has back problems, borderline Diabetic  . MGF  Deceased at age 47       stroke at 41  . PGF  Deceased at age 32       diabetes  . Brother  Alive  . MGM  Deceased at age 72  . PGM  Deceased at age 76  . Sister  Alive  . Mat Uncle  Deceased  . Brother  Alive  . Sister  Alive  . Child  (Not Specified)  . Neg Hx  (Not Specified)        His family history includes Arthritis in his sister; Bladder Cancer in his father; Diabetes in his maternal uncle and paternal grandfather; Hypertension in his father and mother; Prostate cancer in his father; Stroke in his maternal grandfather; Supraventricular tachycardia in his brother and  child. There is no history of Kidney disease, Heart attack, or Colon cancer.      Allergies  Allergen Reactions  . Benazepril     COUGH      Current Outpatient Medications:  .  carvedilol (COREG) 12.5 MG tablet, TAKE 1 TABLET BY MOUTH TWICE  A DAY, Disp: 180 tablet, Rfl: 0 .  chlorthalidone (HYGROTON) 25 MG tablet, Take 1 tablet (25 mg total) by mouth daily. In the morning, Disp: 90 tablet, Rfl: 3 .  furosemide (LASIX) 20 MG tablet, Take 1 tablet (20 mg total) by mouth daily as needed for edema (UP TO 3X QWEEK ONLY)., Disp: 20 tablet, Rfl: 3 .  Multiple Vitamin (MULTIVITAMIN) tablet, Take 1 tablet by mouth daily.  , Disp: , Rfl:  .  valsartan (DIOVAN) 80 MG tablet, Take 1 tablet (80 mg total) by mouth daily. In the evening., Disp: 90 tablet, Rfl: 3 .  XARELTO 20 MG TABS tablet, TAKE 1 TABLET BY MOUTH ONCE DAILY WITH SUPPER, Disp: 90 tablet, Rfl: 1   Patient Care Team: Birdie Sons, MD as PCP - General (Family Medicine) Leonie Man, MD as PCP - Cardiology (Cardiology) Dasher, Rayvon Char, MD (Dermatology) Nori Riis, PA-C as Physician Assistant (Urology)    Objective:    Vitals: BP 122/70 (BP Location: Left Arm, Patient Position: Sitting, Cuff Size: Large)   Pulse (!) 51   Temp (!) 96.6 F (35.9 C) (Temporal)   Resp 18   Ht 6\' 3"  (1.905 m)   Wt 253 lb (114.8 kg)   SpO2 99% Comment: room air  BMI 31.62 kg/m    Vitals:   11/18/19 0850  BP: 122/70  Pulse: (!) 51  Resp: 18  Temp: (!) 96.6 F (35.9 C)  TempSrc: Temporal  SpO2: 99%  Weight: 253 lb (114.8 kg)  Height: 6\' 3"  (1.905 m)     Physical Exam   General Appearance:    Obese male. Alert, cooperative, in no acute distress, appears stated age  Head:    Normocephalic, without obvious abnormality, atraumatic  Eyes:    PERRL, conjunctiva/corneas clear, EOM's intact, fundi    benign, both eyes       Ears:    Normal TM's and external ear canals, both ears  Nose:   Nares normal, septum midline, mucosa  normal, no drainage   or sinus tenderness  Throat:   Lips, mucosa, and tongue normal; teeth and gums normal  Neck:   Supple, symmetrical, trachea midline, no adenopathy;       thyroid:  No enlargement/tenderness/nodules; no carotid   bruit or JVD  Back:     Symmetric, no curvature, ROM normal, no CVA tenderness  Lungs:     Clear to auscultation bilaterally, respirations unlabored  Chest wall:    No tenderness or deformity  Heart:    Bradycardic. Irregularly irregular rhythm. No murmurs, rubs, or gallops.  S1 and S2 normal  Abdomen:     Soft, non-tender, bowel sounds active all four quadrants,    no masses, no organomegaly  Genitalia:    deferred  Rectal:    deferred  Extremities:   All extremities are intact. No cyanosis or edema  Pulses:   2+ and symmetric all extremities  Skin:   Skin color, texture, turgor normal, no rashes or lesions  Lymph nodes:   Cervical, supraclavicular, and axillary nodes normal  Neurologic:   CNII-XII intact. Normal strength, sensation and reflexes      throughout    Depression Screen PHQ 2/9 Scores 10/15/2019 10/15/2019 10/06/2018 10/03/2017  PHQ - 2 Score 0 0 0 0  PHQ- 9 Score - - 0 0       Assessment & Plan:     Routine Health Maintenance and Physical Exam  Exercise Activities and Dietary recommendations Goals    .  DIET - INCREASE WATER INTAKE     Recommend increasing water intake to 3-4 glasses a day.        Immunization History  Administered Date(s) Administered  . Fluad Quad(high Dose 65+) 06/22/2019  . Influenza-Unspecified 06/25/2017, 06/22/2018  . Pneumococcal Conjugate-13 09/20/2014  . Pneumococcal Polysaccharide-23 09/26/2015  . Tdap 03/27/2011  . Zoster 03/24/2010  . Zoster Recombinat (Shingrix) 10/14/2018, 12/22/2018    Health Maintenance  Topic Date Due  . TETANUS/TDAP  03/26/2021  . COLONOSCOPY  05/29/2023  . INFLUENZA VACCINE  Completed  . Hepatitis C Screening  Completed  . PNA vac Low Risk Adult  Completed      Discussed health benefits of physical activity, and encouraged him to engage in regular exercise appropriate for his age and condition.    --------------------------------------------------------------------   1. Annual physical exam Generally doing well.   2. Fatty liver  - US Abdomen Complete; Future  3. Complex renal cyst, left  - US Abdomen Complete; Future  4. Long term current use of anticoagulant therapy   5. 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   The entirety of the information documented in the History of Present Illness, Review of Systems and Physical Exam were personally obtained by me. Portions of this information were initially documented by Meyer Cory, CMA and reviewed by me for thoroughness and accuracy.    [name], MD  [facility]

## 2019-11-18 ENCOUNTER — Encounter: Payer: Self-pay | Admitting: Family Medicine

## 2019-11-18 ENCOUNTER — Ambulatory Visit (INDEPENDENT_AMBULATORY_CARE_PROVIDER_SITE_OTHER): Payer: Medicare PPO | Admitting: Family Medicine

## 2019-11-18 ENCOUNTER — Other Ambulatory Visit: Payer: Self-pay

## 2019-11-18 VITALS — BP 122/70 | HR 51 | Temp 96.6°F | Resp 18 | Ht 75.0 in | Wt 253.0 lb

## 2019-11-18 DIAGNOSIS — N281 Cyst of kidney, acquired: Secondary | ICD-10-CM

## 2019-11-18 DIAGNOSIS — K760 Fatty (change of) liver, not elsewhere classified: Secondary | ICD-10-CM

## 2019-11-18 DIAGNOSIS — D6869 Other thrombophilia: Secondary | ICD-10-CM | POA: Insufficient documentation

## 2019-11-18 DIAGNOSIS — Z0000 Encounter for general adult medical examination without abnormal findings: Secondary | ICD-10-CM

## 2019-11-18 DIAGNOSIS — Z7901 Long term (current) use of anticoagulants: Secondary | ICD-10-CM | POA: Diagnosis not present

## 2019-11-18 NOTE — Patient Instructions (Addendum)
.   Please review the attached list of medications and notify my office if there are any errors.   . Please bring all of your medications to every appointment so we can make sure that our medication list is the same as yours.   . Please contact your eyecare professional to schedule a routine eye exam    

## 2019-11-26 ENCOUNTER — Ambulatory Visit: Payer: Medicare PPO

## 2019-12-08 ENCOUNTER — Other Ambulatory Visit: Payer: Self-pay | Admitting: Student

## 2019-12-08 DIAGNOSIS — K760 Fatty (change of) liver, not elsewhere classified: Secondary | ICD-10-CM

## 2019-12-16 ENCOUNTER — Other Ambulatory Visit: Payer: Self-pay

## 2019-12-16 ENCOUNTER — Ambulatory Visit
Admission: RE | Admit: 2019-12-16 | Discharge: 2019-12-16 | Disposition: A | Discharge status: Home / Self Care | Payer: Medicare PPO | Source: Ambulatory Visit | Attending: Student | Admitting: Student

## 2019-12-16 DIAGNOSIS — K760 Fatty (change of) liver, not elsewhere classified: Secondary | ICD-10-CM | POA: Insufficient documentation

## 2019-12-21 ENCOUNTER — Other Ambulatory Visit: Payer: Self-pay | Admitting: Cardiology

## 2020-01-21 ENCOUNTER — Ambulatory Visit: Payer: Medicare PPO

## 2020-01-25 ENCOUNTER — Other Ambulatory Visit: Payer: Self-pay | Admitting: Cardiology

## 2020-02-02 ENCOUNTER — Other Ambulatory Visit: Payer: Self-pay | Admitting: Cardiology

## 2020-02-02 DIAGNOSIS — I10 Essential (primary) hypertension: Secondary | ICD-10-CM

## 2020-02-12 ENCOUNTER — Other Ambulatory Visit: Payer: Self-pay | Admitting: Internal Medicine

## 2020-02-12 ENCOUNTER — Telehealth: Payer: Self-pay | Admitting: Cardiology

## 2020-02-12 DIAGNOSIS — I10 Essential (primary) hypertension: Secondary | ICD-10-CM

## 2020-02-12 MED ORDER — CARVEDILOL 12.5 MG PO TABS: 12.5000 mg | ORAL_TABLET | Freq: Two times a day (BID) | ORAL | 0 refills | Status: DC

## 2020-02-12 MED ORDER — RIVAROXABAN 20 MG PO TABS: 20.0000 mg | ORAL_TABLET | Freq: Every day | ORAL | 1 refills | Status: DC

## 2020-02-12 NOTE — Telephone Encounter (Signed)
Pt c/o medication issue:  1. Name of Medication: carvedilol (COREG) 12.5 MG tablet and XARELTO 20 MG TABS tablet  2. How are you currently taking this medication (dosage and times per day)? As directed but out of carvedilol  3. Are you having a reaction (difficulty breathing--STAT)? no  4. What is your medication issue? Gerald Stabs from [facility] called and states that he sent over a request for refill on Carvedilol on 02/02/20 but got no response. He states that the patient is now out of medication. He just sent in a request for refill on Xarelto today as well. I sent a refill request to the refill team on these medications already but figured I would send to triage too just to make sure the medication gets refilled for patient today.

## 2020-02-12 NOTE — Telephone Encounter (Signed)
Carvedilol refilled  Xarelto refill request sent to [facility] to review

## 2020-02-12 NOTE — Telephone Encounter (Signed)
*  STAT* If patient is at the pharmacy, call can be transferred to refill team.   1. Which medications need to be refilled? (please list name of each medication and dose if known) carvedilol (COREG) 12.5 MG tablet and XARELTO 20 MG TABS tablet  2. Which pharmacy/location (including street and city if local pharmacy) is medication to be sent to? [place], [place]  3. Do they need a 30 day or 90 day supply? 90   Patient is out of medication

## 2020-03-01 ENCOUNTER — Other Ambulatory Visit: Payer: Self-pay

## 2020-03-01 ENCOUNTER — Ambulatory Visit: Payer: Medicare PPO | Admitting: Cardiology

## 2020-03-01 VITALS — BP 138/80 | HR 66 | Temp 95.0°F | Ht 75.0 in | Wt 253.0 lb

## 2020-03-01 DIAGNOSIS — R05 Cough: Secondary | ICD-10-CM

## 2020-03-01 DIAGNOSIS — I4821 Permanent atrial fibrillation: Secondary | ICD-10-CM | POA: Diagnosis not present

## 2020-03-01 DIAGNOSIS — I10 Essential (primary) hypertension: Secondary | ICD-10-CM

## 2020-03-01 DIAGNOSIS — Z7901 Long term (current) use of anticoagulants: Secondary | ICD-10-CM | POA: Diagnosis not present

## 2020-03-01 DIAGNOSIS — I428 Other cardiomyopathies: Secondary | ICD-10-CM | POA: Diagnosis not present

## 2020-03-01 DIAGNOSIS — R059 Cough, unspecified: Secondary | ICD-10-CM

## 2020-03-01 NOTE — Patient Instructions (Signed)
Medication Instructions:  no changes  May use over the counter allergies medication - like  Allegra, Mucinex  Or Claritin rr there generic  ( just plain )  *If you need a refill on your cardiac medications before your next appointment, please call your pharmacy*   Lab Work: Not needed    Testing/Procedures: Not needed  Follow-Up: At [facility], you and your health needs are our priority.  As part of our continuing mission to provide you with exceptional heart care, we have created designated Provider Care Teams.  These Care Teams include your primary Cardiologist (physician) and Advanced Practice Providers (APPs -  Physician Assistants and Nurse Practitioners) who all work together to provide you with the care you need, when you need it.  We recommend signing up for the patient portal called "MyChart".  Sign up information is provided on this After Visit Summary.  MyChart is used to connect with patients for Virtual Visits (Telemedicine).  Patients are able to view lab/test results, encounter notes, upcoming appointments, etc.  Non-urgent messages can be sent to your provider as well.   To learn more about what you can do with MyChart, go to [email or web address].    Your next appointment:   12 month(s)- May 2022  The format for your next appointment:   In Person  Provider:   Glenetta Hew, MD   Other Instructions Discuss with primary about your allergies

## 2020-03-01 NOTE — Progress Notes (Signed)
Primary Care Provider: Birdie Sons, MD Cardiologist: Glenetta Hew, MD Electrophysiologist: None  Clinic Note: Chief Complaint  Patient presents with  . Follow-up    6 months.  . Atrial Fibrillation     HPI:    [name] is a 74 y.o. male with a PMH notable for permanent A. fib with history of nonischemic cardiomyopathy with EF improved to baseline 55%, bilateral venous stasis swelling who presents today for annual follow-up.  [name] was last seen by me on January 24, 2019 via telemedicine--he noted a pretty severe dry cough not associate with any fever or chills.  This had been going on for couple weeks, and seem to get worse with exertion.  He noted that he would get dizzy with some chest discomfort with coughing but not really anginal pain.  Was noting some exertional dyspnea climbing stairs but no orthopnea or PND.  Chronic edema that was well controlled.  Converted from ACE inhibitor to ARB, added as chlorthalidone.  Echo ordered, but never followed through on because BNP level was only 160  Chest x-ray ordered  He was actually seen by Odie Sera, NP back in November 2020.  He said that he has episodes of swelling about 2-3 times a year.  No pain associated with it.  Very active at the [facility] 2 to 3 days a week and walks 4 days a week for 20 minutes at a time.  Recent Hospitalizations: None  Reviewed  CV studies:    . Echo ordered in May 2020 not done.: . CXR Feb 25, 2019: Mild peribronchial thickening with mild bibasilar atelectasis and trace pleural effusions.  No pulmonary edema.   Interval History:   [name] returns here today overall doing fairly well.  He still has intermittent cough and over the last for 5 days been having some congestion wheezing headache.  Has a constant need to clear his throat.  He did stated switching from ACE inhibitor to ARB last year notably helped his dry cough at that time.  He is not having any real heart  failure symptoms of PND, orthopnea or change in edema.  But he does get little short of breath climbing up stairs.  No chest pain or pressure associated with this.  Otherwise he seems to doing pretty well with no real sensation of being in or out of A. fib.  His swelling is pretty well controlled taking Lasix may be once or twice a month for mild ankle edema.  CV Review of Symptoms (Summary) Cardiovascular ROS: positive for - dyspnea on exertion and Well-controlled swelling negative for - chest pain, irregular heartbeat, orthopnea, palpitations, paroxysmal nocturnal dyspnea, rapid heart rate, shortness of breath or Syncope/near syncope, TIA/amaurosis fugax, claudication  The patient does not have symptoms concerning for COVID-19 infection (fever, chills, cough, or new shortness of breath).  The patient is practicing social distancing & Masking.    REVIEWED OF SYSTEMS   Review of Systems  Constitutional: Negative for malaise/fatigue and weight loss (He is actually gained a little bit more weight now with being more sedentary.).  HENT: Positive for congestion. Negative for nosebleeds and sinus pain.   Respiratory: Positive for cough (Occasional 84 for 5 times a day.), shortness of breath and wheezing. Negative for sputum production (But feels a postnasal drip).   Cardiovascular: Positive for leg swelling (Stable).  Gastrointestinal: Negative for abdominal pain, blood in stool and melena.  Genitourinary: Negative for hematuria.  Musculoskeletal: Positive for joint  pain. Negative for falls.  Neurological: Positive for dizziness (When he gets really congested). Negative for weakness.  Endo/Heme/Allergies: Positive for environmental allergies.  Psychiatric/Behavioral: Negative for memory loss. The patient is not nervous/anxious and does not have insomnia.      I have reviewed and (if needed) personally updated the patient's problem list, medications, allergies, past medical and surgical history,  social and family history.   PAST MEDICAL HISTORY   Past Medical History:  Diagnosis Date  . Basal cell carcinoma of right lower extremity 03/24/2015  . BPH with obstruction/lower urinary tract symptoms   . Chronic atrial fibrillation ([place])   . Dislocation of shoulder, anterior, right, closed 07/24/2014  . Echocardiogram abnormal    2009 moderate to severely dilated left atrium  . Erectile dysfunction   . Essential hypertension   . Gout   . History of chicken pox   . History of measles   . Hyperglycemia   . Hypogonadism in male   . Nocturia   . Peyronie's disease   . Traumatic tear of right rotator cuff 07/24/2014  . Venous stasis    chronic    PAST SURGICAL HISTORY   Past Surgical History:  Procedure Laterality Date  . CARDIAC CATHETERIZATION  03/28/2004   normal coronaries, reduced EF at 25-30% (Dr. Gerrie Nordmann)  . CARDIOVERSION, TEE guided  03/29/2004   Dr. Gerrie Nordmann   . COLONOSCOPY  05/28/2013   Dr. Rayann Heman  . HERNIA REPAIR  age 21  . KNEE SURGERY    . LUMBAR FUSION  age 31  . MOHS SURGERY Right 08/20/2019   ALA done by Ishmael Holter, MD [facility]  . MYOVIEW CARDIOVASCULAR STRESS TEST  07/2003   anterior wall thinning, LV systolic function depressed at 33%  . PILONIDAL CYST EXCISION    . Right Eyebrow Tumor Removed  age 42   . SHOULDER ARTHROSCOPY WITH ROTATOR CUFF REPAIR AND SUBACROMIAL DECOMPRESSION Right 08/19/2014   Procedure: SHOULDER ARTHROSCOPY WITH ROTATOR CUFF REPAIR AND SUBACROMIAL DECOMPRESSION;  Surgeon: Lorn Junes, MD;  Location: [place];  Service: Orthopedics;  Laterality: Right;  . TOTAL HIP ARTHROPLASTY Right 03/25/2006   Dr. Wanda Plump. [place]  . TRANSTHORACIC ECHOCARDIOGRAM  08/2008   EF=>55%, mild-mod dilated; LA mod-severely dilated; RA mild-mod dilated; mild calcif of MV, borderline MVP, trace MR; trace TR; mild pulm valve regurg     MEDICATIONS/ALLERGIES   Current Meds  Medication Sig  . carvedilol (COREG) 12.5 MG tablet Take 1  tablet (12.5 mg total) by mouth 2 (two) times daily.  . chlorthalidone (HYGROTON) 25 MG tablet TAKE 1 TABLET BY MOUTH ONCE DAILY IN THEMORNING  . furosemide (LASIX) 20 MG tablet Take 1 tablet (20 mg total) by mouth daily as needed for edema (UP TO 3X QWEEK ONLY).  . Multiple Vitamin (MULTIVITAMIN) tablet Take 1 tablet by mouth daily.    . valsartan (DIOVAN) 80 MG tablet Take 1 tablet (80 mg total) by mouth daily.  Alveda Reasons 20 MG TABS tablet TAKE 1 TABLET BY MOUTH ONCE DAILY WITH SUPPER    Allergies  Allergen Reactions  . Benazepril     COUGH     SOCIAL HISTORY/FAMILY HISTORY   Reviewed in Epic:  Pertinent findings: No change  OBJCTIVE -PE, EKG, labs   Wt Readings from Last 3 Encounters:  03/01/20 253 lb (114.8 kg)  11/18/19 253 lb (114.8 kg)  10/07/19 243 lb 14.4 oz (110.6 kg)    Physical Exam: BP 138/80 (BP Location: Left Arm, Patient  Position: Sitting, Cuff Size: Normal)   Pulse 66   Temp (!) 95 F (35 C)   Ht 6\' 3"  (1.905 m)   Wt 253 lb (114.8 kg)   BMI 31.62 kg/m  Physical Exam  Constitutional: He appears well-developed and well-nourished. No distress (Healthy-appearing).  Mildly obese gentleman.  Well-groomed.  Dark tan.  HENT:  Head: Normocephalic and atraumatic.  Neck: No JVD present.  Cardiovascular: Normal rate, normal heart sounds and intact distal pulses. An irregularly irregular rhythm present. PMI is not displaced. Exam reveals decreased pulses (Mildly decreased pedal pulses because of edema.). Exam reveals no gallop and no friction rub.  No murmur heard. Pulmonary/Chest: Effort normal. No respiratory distress. He has wheezes (Mild late expiratory). He has no rales. He exhibits no tenderness.  Abdominal: Soft. Bowel sounds are normal. He exhibits no distension. There is no abdominal tenderness.  No HSM  Musculoskeletal:        General: Edema (Trace to 1+ bilateral LE) present. Normal range of motion.     Cervical back: Normal range of motion and neck  supple.  Neurological: He is alert.  Psychiatric: He has a normal mood and affect. His behavior is normal. Judgment and thought content normal.  Vitals reviewed.    Adult ECG Report  Rate: 66 ;  Rhythm: atrial fibrillation; nonspecific IVCD otherwise normal axis, intervals durations.  Narrative Interpretation: Stable  Recent Labs: Due for labs to be checked by PCP here in June. Lab Results  Component Value Date   CHOL 173 03/03/2019   HDL 44 03/03/2019   LDLCALC 111 (H) 03/03/2019   TRIG 91 03/03/2019   CHOLHDL 3.9 03/03/2019   Lab Results  Component Value Date   CREATININE 0.89 09/01/2019   BUN 10 09/01/2019   NA 134 09/01/2019   K 4.3 09/01/2019   CL 91 (L) 09/01/2019   CO2 28 09/01/2019   Lab Results  Component Value Date   TSH 3.330 04/11/2017    ASSESSMENT/PLAN    Problem List Items Addressed This Visit    Permanent atrial fibrillation (HCC) CHA2DS2-VASc Score 3; On Xarelto - Primary (Chronic)    Permanent A. fib, rate controlled with carvedilol.  Pretty much asymptomatic at this point.  Anticoagulated with Xarelto.  No bleeding issues.      Relevant Orders   EKG 12-Lead   Essential hypertension (Chronic)    Blood pressure is borderline high today.  He is on chlorthalidone for carvedilol and valsartan.  If pressures continue to be elevated, would probably need to titrate valsartan further.      Relevant Orders   EKG 12-Lead   Long term current use of anticoagulant therapy (Chronic)    Remains on Xarelto with no bleeding issues. CHA2DS2-VASc Score 3,  Okay to hold Xarelto for procedures or surgeries.-->  For neurologic or spinal procedures with hold for 3 days otherwise 1 to 2 days depending on risk of procedure.       Relevant Orders   EKG 12-Lead   NICM (nonischemic cardiomyopathy) (HCC) (Chronic)    Prior history of reduced EF associated being in rapid A. fib.  However EF improved back to baseline in 2009.  I think his current cough is probably  more related to allergies.  However if symptoms were to get worse and he were to have more exertional dyspnea, would have a low threshold to check 2D echo.  Continue ARB and carvedilol.  As needed Lasix      Cough-new onset, nonproductive  Interestingly, his cough got better with switching to ARB last year, but this this cough is more associated with postnasal drip and congestion which suggest this is probably related to allergies.  May use over the counter allergies medication - like  Allegra, Mucinex  Or Claritin rr there generic  ( just plain ) Avoid Sudafed, pseudoephedrine, ephedrine          COVID-19 Education: The signs and symptoms of COVID-19 were discussed with the patient and how to seek care for testing (follow up with PCP or arrange E-visit).   The importance of social distancing and COVID-19 vaccination was discussed today.  I spent a total of 87minutes with the patient. >  50% of the time was spent in direct patient consultation.  Additional time spent with chart review  / charting (studies, outside notes, etc): 8 Total Time: 31 min   Current medicines are reviewed at length with the patient today.  (+/- concerns) none  Notice: This dictation was prepared with Dragon dictation along with smaller phrase technology. Any transcriptional errors that result from this process are unintentional and may not be corrected upon review.  Patient Instructions / Medication Changes & Studies & Tests Ordered   Patient Instructions  Medication Instructions:  no changes  May use over the counter allergies medication - like  Allegra, Mucinex  Or Claritin rr there generic  ( just plain )  *If you need a refill on your cardiac medications before your next appointment, please call your pharmacy*   Lab Work: Not needed    Testing/Procedures: Not needed  Follow-Up: At [facility], you and your health needs are our priority.  As part of our continuing mission to provide you  with exceptional heart care, we have created designated Provider Care Teams.  These Care Teams include your primary Cardiologist (physician) and Advanced Practice Providers (APPs -  Physician Assistants and Nurse Practitioners) who all work together to provide you with the care you need, when you need it.  We recommend signing up for the patient portal called "MyChart".  Sign up information is provided on this After Visit Summary.  MyChart is used to connect with patients for Virtual Visits (Telemedicine).  Patients are able to view lab/test results, encounter notes, upcoming appointments, etc.  Non-urgent messages can be sent to your provider as well.   To learn more about what you can do with MyChart, go to [email or web address].    Your next appointment:   12 month(s)- May 2022  The format for your next appointment:   In Person  Provider:   Glenetta Hew, MD   Other Instructions Discuss with primary about your allergies     Studies Ordered:   Orders Placed This Encounter  Procedures  . EKG 12-Lead     Glenetta Hew, M.D., M.S. Interventional Cardiologist   Pager # [phone] Phone # [phone] [address] [place], [place] [address]   Thank you for choosing Heartcare at [facility]!!

## 2020-03-08 ENCOUNTER — Encounter: Payer: Self-pay | Admitting: Cardiology

## 2020-03-08 NOTE — Assessment & Plan Note (Signed)
Blood pressure is borderline high today.  He is on chlorthalidone for carvedilol and valsartan.  If pressures continue to be elevated, would probably need to titrate valsartan further.

## 2020-03-08 NOTE — Assessment & Plan Note (Signed)
Remains on Xarelto with no bleeding issues. CHA2DS2-VASc Score 3,  Okay to hold Xarelto for procedures or surgeries.-->  For neurologic or spinal procedures with hold for 3 days otherwise 1 to 2 days depending on risk of procedure.

## 2020-03-08 NOTE — Assessment & Plan Note (Signed)
Permanent A. fib, rate controlled with carvedilol.  Pretty much asymptomatic at this point.  Anticoagulated with Xarelto.  No bleeding issues.

## 2020-03-08 NOTE — Assessment & Plan Note (Signed)
Interestingly, his cough got better with switching to ARB last year, but this this cough is more associated with postnasal drip and congestion which suggest this is probably related to allergies.  May use over the counter allergies medication - like  Allegra, Mucinex  Or Claritin rr there generic  ( just plain ) Avoid Sudafed, pseudoephedrine, ephedrine

## 2020-03-08 NOTE — Assessment & Plan Note (Signed)
Prior history of reduced EF associated being in rapid A. fib.  However EF improved back to baseline in 2009.  I think his current cough is probably more related to allergies.  However if symptoms were to get worse and he were to have more exertional dyspnea, would have a low threshold to check 2D echo.  Continue ARB and carvedilol.  As needed Lasix

## 2020-04-20 ENCOUNTER — Other Ambulatory Visit: Payer: Self-pay | Admitting: Cardiology

## 2020-04-20 DIAGNOSIS — I10 Essential (primary) hypertension: Secondary | ICD-10-CM

## 2020-06-15 ENCOUNTER — Other Ambulatory Visit: Payer: Self-pay | Admitting: Cardiology

## 2020-09-26 ENCOUNTER — Other Ambulatory Visit: Payer: Self-pay | Admitting: Family Medicine

## 2020-09-26 DIAGNOSIS — N401 Enlarged prostate with lower urinary tract symptoms: Secondary | ICD-10-CM

## 2020-09-28 ENCOUNTER — Other Ambulatory Visit: Payer: Medicare PPO

## 2020-09-28 ENCOUNTER — Encounter: Payer: Self-pay | Admitting: Urology

## 2020-09-29 ENCOUNTER — Other Ambulatory Visit: Payer: Medicare Other

## 2020-10-06 ENCOUNTER — Other Ambulatory Visit: Payer: Medicare PPO

## 2020-10-06 ENCOUNTER — Ambulatory Visit: Payer: Medicare Other | Admitting: Urology

## 2020-10-06 ENCOUNTER — Other Ambulatory Visit: Payer: Self-pay

## 2020-10-06 DIAGNOSIS — N138 Other obstructive and reflux uropathy: Secondary | ICD-10-CM

## 2020-10-07 LAB — PSA: Prostate Specific Ag, Serum: 0.5 ng/mL (ref 0.0–4.0)

## 2020-10-11 ENCOUNTER — Other Ambulatory Visit: Payer: Self-pay | Admitting: Cardiology

## 2020-10-12 NOTE — Progress Notes (Signed)
10/13/2020  11:39 AM   [name] 16-Jul-1946 [id number]  Referring provider: Birdie Sons, MD [address] [place],  [place] [address]  Chief Complaint  Patient presents with   Benign Prostatic Hypertrophy   HPI: Patient is a 75 y.o. male presenting for his annual exam. He has a history of erectile dysfunction (s/p penile prothesis placement on 12/21/2013) and BPH with LUTS.  Erectile dysfunction Penile prothesis in place and functioning properly.    BPH WITH LUTS His I-PSS score today is 8, which is moderate ower urinary tract symptomatology. He is pleased with his quality life due to his urinary symptoms. His previous I-PSS score was 5/3.    Patient denies any modifying or aggravating factors.  Patient denies any gross hematuria, dysuria or suprapubic/flank pain.  Patient denies any fevers, chills, nausea or vomiting.    IPSS    Row Name 10/13/20 1100         International Prostate Symptom Score   How often have you had the sensation of not emptying your bladder? Less than 1 in 5     How often have you had to urinate less than every two hours? Less than 1 in 5 times     How often have you found you stopped and started again several times when you urinated? Less than 1 in 5 times     How often have you found it difficult to postpone urination? Less than 1 in 5 times     How often have you had a weak urinary stream? Less than 1 in 5 times     How often have you had to strain to start urination? Less than 1 in 5 times     How many times did you typically get up at night to urinate? 2 Times     Total IPSS Score 8           Quality of Life due to urinary symptoms   If you were to spend the rest of your life with your urinary condition just the way it is now how would you feel about that? Pleased            Score:  1-7 Mild 8-19 Moderate 20-35 Severe  PMH: Past Medical History:  Diagnosis Date   Basal cell carcinoma of right lower extremity  03/24/2015   BPH with obstruction/lower urinary tract symptoms    Chronic atrial fibrillation (HCC)    Dislocation of shoulder, anterior, right, closed 07/24/2014   Echocardiogram abnormal    2009 moderate to severely dilated left atrium   Erectile dysfunction    Essential hypertension    Gout    History of chicken pox    History of measles    Hyperglycemia    Hypogonadism in male    Nocturia    Peyronie's disease    Traumatic tear of right rotator cuff 07/24/2014   Venous stasis    chronic   Surgical History: Past Surgical History:  Procedure Laterality Date   CARDIAC CATHETERIZATION  03/28/2004   normal coronaries, reduced EF at 25-30% (Dr. Gerrie Nordmann)   CARDIOVERSION, TEE guided  03/29/2004   Dr. Gerrie Nordmann    COLONOSCOPY  05/28/2013   Dr. Rayann Heman   HERNIA REPAIR  age 33   KNEE SURGERY     LUMBAR FUSION  age 11   MOHS SURGERY Right 08/20/2019   ALA done by Ishmael Holter, MD [name]   MYOVIEW CARDIOVASCULAR STRESS TEST  07/2003  anterior wall thinning, LV systolic function depressed at 33%   PILONIDAL CYST EXCISION     Right Eyebrow Tumor Removed  age 50    SHOULDER ARTHROSCOPY WITH ROTATOR CUFF REPAIR AND SUBACROMIAL DECOMPRESSION Right 08/19/2014   Procedure: SHOULDER ARTHROSCOPY WITH ROTATOR CUFF REPAIR AND SUBACROMIAL DECOMPRESSION;  Surgeon: Lorn Junes, MD;  Location: [place];  Service: Orthopedics;  Laterality: Right;   TOTAL HIP ARTHROPLASTY Right 03/25/2006   Dr. Wanda Plump. [place]   TRANSTHORACIC ECHOCARDIOGRAM  08/2008   EF=>55%, mild-mod dilated; LA mod-severely dilated; RA mild-mod dilated; mild calcif of MV, borderline MVP, trace MR; trace TR; mild pulm valve regurg    Home Medications:  Allergies as of 10/13/2020      Reactions   Benazepril    COUGH       Medication List       Accurate as of October 13, 2020 11:39 AM. If you have any questions, ask your nurse or doctor.        carvedilol 12.5 MG tablet Commonly  known as: COREG TAKE 1 TABLET BY MOUTH TWICE A DAY   chlorthalidone 25 MG tablet Commonly known as: HYGROTON TAKE 1 TABLET BY MOUTH ONCE DAILY IN THEMORNING   furosemide 20 MG tablet Commonly known as: LASIX Take 1 tablet (20 mg total) by mouth daily as needed for edema (UP TO 3X QWEEK ONLY).   multivitamin tablet Take 1 tablet by mouth daily.   valsartan 80 MG tablet Commonly known as: DIOVAN Take 1 tablet (80 mg total) by mouth daily.   Xarelto 20 MG Tabs tablet Generic drug: rivaroxaban TAKE 1 TABLET BY MOUTH ONCE DAILY WITH SUPPER      Allergies:  Allergies  Allergen Reactions   Benazepril     COUGH     Family History: Family History  Problem Relation Age of Onset   Hypertension Father    Prostate cancer Father    Bladder Cancer Father    Hypertension Mother    Stroke Maternal Grandfather    Diabetes Paternal Grandfather    Supraventricular tachycardia Brother    Arthritis Sister        RA   Diabetes Maternal Uncle        TYPE 2   Supraventricular tachycardia Child    Kidney disease Neg Hx    Heart attack Neg Hx    Colon cancer Neg Hx    Social History:  reports that he has never smoked. He has never used smokeless tobacco. He reports current alcohol use of about 4.0 standard drinks of alcohol per week. He reports that he does not use drugs.  Pertinent ROS in HPI  Physical Exam: BP (!) 163/77    Pulse 65    Ht 6\' 3"  (1.905 m)    Wt 245 lb (111.1 kg)    BMI 30.62 kg/m   Constitutional:  Well nourished. Alert and oriented, No acute distress. HEENT: [place] AT, mask in place.  Trachea midline Cardiovascular: No clubbing, cyanosis, or edema. Respiratory: Normal respiratory effort, no increased work of breathing. GU: No CVA tenderness.  No bladder fullness or masses.  Patient with circumcised phallus. Urethral meatus is patent.  No penile discharge. No penile lesions or rashes. Prosthetic cylinders in place.  No evidence of erosion.  Scrotum  without lesions, cysts, rashes and/or edema.  Prosthetic pump is palpated in the left hemiscrotum.  Testicles are located scrotally bilaterally. No masses are appreciated in the testicles. Left and right epididymis are normal. Rectal:  Patient with  normal sphincter tone. Anus and perineum without scarring or rashes. No rectal masses are appreciated. Prostate is approximately 55 grams, nonodules are appreciated. Seminal vesicles could not be palpated Neurologic: Grossly intact, no focal deficits, moving all 4 extremities. Psychiatric: Normal mood and affect.  Laboratory Data:  PSA History  0.7 ng/mL on 09/21/2013  0.5 ng/mL on 02/11/2014  0.7 ng/mL on 09/24/2014  0.6 ng/mL on 09/23/2015  Component     Latest Ref Rng & Units 09/20/2016 09/18/2017 09/16/2018 09/17/2019  Prostate Specific Ag, Serum     0.0 - 4.0 ng/mL 0.7 0.6 0.5 0.4   Component     Latest Ref Rng & Units 10/06/2020  Prostate Specific Ag, Serum     0.0 - 4.0 ng/mL 0.5   I have reviewed the labs.  Assessment & Plan:    1. BPH with LUTS IPSS score is 8/1, it is worsening Continue conservative management, avoiding bladder irritants and timed voiding's RTC in 12 months for IPSS, PSA and exam   2.  ED Prothesis is working properly and is still satisfied   Return in about 1 year (around 10/13/2021) for IPSS, PSA and exam.  Hulan Fray  [facility] [address] [place], [place] [address] [phone]

## 2020-10-12 NOTE — Telephone Encounter (Signed)
Prescription refill request for Xarelto received.  Indication:atrial fibrillation Last office visit:5/21 [place] Weight:114.8 kg Age:75 Scr: 0.8 11/20  Needs labs CrCl:135.53 ml/min  Prescription refilled

## 2020-10-13 ENCOUNTER — Encounter: Payer: Self-pay | Admitting: Urology

## 2020-10-13 ENCOUNTER — Ambulatory Visit: Payer: Medicare PPO | Admitting: Urology

## 2020-10-13 ENCOUNTER — Other Ambulatory Visit: Payer: Self-pay

## 2020-10-13 VITALS — BP 163/77 | HR 65 | Ht 75.0 in | Wt 245.0 lb

## 2020-10-13 DIAGNOSIS — N529 Male erectile dysfunction, unspecified: Secondary | ICD-10-CM

## 2020-10-13 DIAGNOSIS — N138 Other obstructive and reflux uropathy: Secondary | ICD-10-CM | POA: Diagnosis not present

## 2020-10-13 DIAGNOSIS — N401 Enlarged prostate with lower urinary tract symptoms: Secondary | ICD-10-CM | POA: Diagnosis not present

## 2020-10-14 LAB — URINALYSIS, COMPLETE
Bilirubin, UA: NEGATIVE
Glucose, UA: NEGATIVE
Ketones, UA: NEGATIVE
Leukocytes,UA: NEGATIVE
Nitrite, UA: NEGATIVE
Protein,UA: NEGATIVE
RBC, UA: NEGATIVE
Specific Gravity, UA: 1.02 (ref 1.005–1.030)
Urobilinogen, Ur: 0.2 mg/dL (ref 0.2–1.0)
pH, UA: 6.5 (ref 5.0–7.5)

## 2020-10-14 LAB — MICROSCOPIC EXAMINATION: Bacteria, UA: NONE SEEN

## 2020-10-17 ENCOUNTER — Other Ambulatory Visit: Payer: Self-pay

## 2020-10-17 ENCOUNTER — Encounter: Payer: Self-pay | Admitting: Cardiology

## 2020-10-17 ENCOUNTER — Ambulatory Visit: Payer: Medicare PPO | Admitting: Cardiology

## 2020-10-17 VITALS — BP 152/90 | HR 62 | Ht 75.0 in | Wt 247.6 lb

## 2020-10-17 DIAGNOSIS — I4821 Permanent atrial fibrillation: Secondary | ICD-10-CM | POA: Diagnosis not present

## 2020-10-17 DIAGNOSIS — I428 Other cardiomyopathies: Secondary | ICD-10-CM

## 2020-10-17 DIAGNOSIS — I872 Venous insufficiency (chronic) (peripheral): Secondary | ICD-10-CM

## 2020-10-17 DIAGNOSIS — I10 Essential (primary) hypertension: Secondary | ICD-10-CM | POA: Diagnosis not present

## 2020-10-17 DIAGNOSIS — Z7901 Long term (current) use of anticoagulants: Secondary | ICD-10-CM

## 2020-10-17 MED ORDER — RIVAROXABAN 20 MG PO TABS: ORAL_TABLET | ORAL | 2 refills | Status: DC

## 2020-10-17 MED ORDER — VALSARTAN 160 MG PO TABS: 160.0000 mg | ORAL_TABLET | Freq: Every day | ORAL | 3 refills | Status: DC

## 2020-10-17 NOTE — Progress Notes (Signed)
Primary Care Provider: Birdie Sons, MD Cardiologist: Glenetta Hew, MD Electrophysiologist: None  Clinic Note: Chief Complaint  Patient presents with  . Atrial Fibrillation    Stable  . Leg Swelling    Stable  . Follow-up    Delayed 63-month    Problem List Items Addressed This Visit    Permanent atrial fibrillation (HCC) CHA2DS2-VASc Score 3; On Xarelto - Primary (Chronic)    Heart rate controlled A. fib.  On carvedilol. Relatively asymptomatic.  No signs of chronotropic incompetence.  No bleeding issues on Xarelto. Refill prescription for 90 days.      Relevant Medications   valsartan (DIOVAN) 160 MG tablet   rivaroxaban (XARELTO) 20 MG TABS tablet   Other Relevant Orders   EKG 12-Lead (Completed)   Essential hypertension (Chronic)    Still elevated blood pressure..  We are adding chlorthalidone last visit.  Plan for now we will increase valsartan to 160 mg      Relevant Medications   valsartan (DIOVAN) 160 MG tablet   rivaroxaban (XARELTO) 20 MG TABS tablet   Venous stasis dermatitis (Chronic)    Follow-up at [facility] for severe deep venous insufficiency.  Continue to wear compression stockings and elevate feet.  PRN Lasix.  Thankfully, he is on Xarelto therefore alleviating risk for DVT.      Long term current use of anticoagulant therapy (Chronic)    CHA2DS2-VASc score is 3: Age, hypertension,      NICM (nonischemic cardiomyopathy) (HCC) (Chronic)    Resolved follow-up echocardiogram.  No active CHF symptoms besides chronic edema.  Plan: Continue carvedilol, increase valsartan dose to 160 mg based on hypertension.  Continue as needed Lasix.      Relevant Medications   valsartan (DIOVAN) 160 MG tablet   rivaroxaban (XARELTO) 20 MG TABS tablet   Other Relevant Orders   EKG 12-Lead (Completed)      HPI:    [name] is a 75 y.o. male with a PMH notable for permanent A. fib (history of nonischemic cardiomyopathy-resolved  back to EF of 55%), along with bilateral venous stasis swelling who presents today for 85-month follow-up.  BRIANT ARIF was last seen on Mar 01, 2020-doing well.  Intermittent cough for 5 days.  Switched from ACE inhibitor to ARB.  That helped cough.  No chest pain.  Mild dyspnea walking up stairs.  Otherwise no real sensation of being in A. fib.  Taking Lasix maybe once or twice a month for a ankle edema. => No new orders  Recent Hospitalizations: None  Reviewed  CV studies:    The following studies were reviewed today: (if available, images/films reviewed: From Epic Chart or Care Everywhere) . None:   Interval History:   NYXON SHERE returns today again doing fairly well.  He says he is doing "wonderful".  Says that he may get short of breath walking up hills or up a few flights of steps, but otherwise no real chest pain or pressure.  Usually when he gets short of breath having to stop to catch his breath and then keeps on walking.  No chest pain or pressure associated.  No sensation of irregular heartbeats or palpitations.  No tachycardia symptoms.  No heart failure symptoms of PND, orthopnea but does have chronic mild edema.  His legs do feels a little tired and achy at the end of the day, and swelling does go down a little bit.  He says he does not  wear support stockings off and on but not all the time.  CV Review of Symptoms (Summary): no chest pain or dyspnea on exertion positive for - edema negative for - irregular heartbeat, orthopnea, palpitations, paroxysmal nocturnal dyspnea, rapid heart rate, shortness of breath or Lightheadedness or dizziness, syncope/near syncope or TIA/amaurosis fugax, claudication  The patient does not have symptoms concerning for COVID-19 infection (fever, chills, cough, or new shortness of breath).   REVIEWED OF SYSTEMS   Review of Systems  Constitutional: Negative for malaise/fatigue and weight loss.  HENT: Negative for congestion (Off-and-on but  not now).   Respiratory: Positive for wheezing (With allergies). Negative for cough (Occasional) and shortness of breath.   Cardiovascular: Positive for leg swelling (Stable).  Gastrointestinal: Negative for abdominal pain, blood in stool and melena.  Genitourinary: Negative for hematuria.  Neurological: Negative for dizziness and weakness.  Endo/Heme/Allergies: Positive for environmental allergies. Bruises/bleeds easily.  Psychiatric/Behavioral: Negative.    I have reviewed and (if needed) personally updated the patient's problem list, medications, allergies, past medical and surgical history, social and family history.   PAST MEDICAL HISTORY   Past Medical History:  Diagnosis Date  . Basal cell carcinoma of right lower extremity 03/24/2015  . BPH with obstruction/lower urinary tract symptoms   . Chronic atrial fibrillation ([place])   . Dislocation of shoulder, anterior, right, closed 07/24/2014  . Echocardiogram abnormal    2009 moderate to severely dilated left atrium  . Erectile dysfunction   . Essential hypertension   . Gout   . History of chicken pox   . History of measles   . Hyperglycemia   . Hypogonadism in male   . Nocturia   . Peyronie's disease   . Traumatic tear of right rotator cuff 07/24/2014  . Venous stasis    chronic    PAST SURGICAL HISTORY   Past Surgical History:  Procedure Laterality Date  . CARDIAC CATHETERIZATION  03/28/2004   normal coronaries, reduced EF at 25-30% (Dr. Gerrie Nordmann)  . CARDIOVERSION, TEE guided  03/29/2004   Dr. Gerrie Nordmann   . COLONOSCOPY  05/28/2013   Dr. Rayann Heman  . HERNIA REPAIR  age 30  . KNEE SURGERY    . LUMBAR FUSION  age 73  . MOHS SURGERY Right 08/20/2019   ALA done by Ishmael Holter, MD [facility]  . MYOVIEW CARDIOVASCULAR STRESS TEST  07/2003   anterior wall thinning, LV systolic function depressed at 33%  . PILONIDAL CYST EXCISION    . Right Eyebrow Tumor Removed  age 72   . SHOULDER ARTHROSCOPY WITH ROTATOR CUFF REPAIR AND  SUBACROMIAL DECOMPRESSION Right 08/19/2014   Procedure: SHOULDER ARTHROSCOPY WITH ROTATOR CUFF REPAIR AND SUBACROMIAL DECOMPRESSION;  Surgeon: Lorn Junes, MD;  Location: [facility];  Service: Orthopedics;  Laterality: Right;  . TOTAL HIP ARTHROPLASTY Right 03/25/2006   Dr. Wanda Plump. [place]  . TRANSTHORACIC ECHOCARDIOGRAM  08/2008   EF=>55%, mild-mod dilated; LA mod-severely dilated; RA mild-mod dilated; mild calcif of MV, borderline MVP, trace MR; trace TR; mild pulm valve regurg     Immunization History  Administered Date(s) Administered  . Fluad Quad(high Dose 65+) 06/22/2019  . Influenza-Unspecified 06/25/2017, 06/22/2018  . Pneumococcal Conjugate-13 09/20/2014  . Pneumococcal Polysaccharide-23 09/26/2015  . Tdap 03/27/2011  . Zoster 03/24/2010  . Zoster Recombinat (Shingrix) 10/14/2018, 12/22/2018    MEDICATIONS/ALLERGIES   Current Meds  Medication Sig  . carvedilol (COREG) 12.5 MG tablet TAKE 1 TABLET BY MOUTH TWICE A DAY  . chlorthalidone (HYGROTON) 25  MG tablet TAKE 1 TABLET BY MOUTH ONCE DAILY IN THEMORNING  . furosemide (LASIX) 20 MG tablet Take 1 tablet (20 mg total) by mouth daily as needed for edema (UP TO 3X QWEEK ONLY).  . Multiple Vitamin (MULTIVITAMIN) tablet Take 1 tablet by mouth daily.  . valsartan (DIOVAN) 160 MG tablet Take 1 tablet (160 mg total) by mouth daily.  . [DISCONTINUED] valsartan (DIOVAN) 80 MG tablet Take 1 tablet (80 mg total) by mouth daily.  . [DISCONTINUED] XARELTO 20 MG TABS tablet TAKE 1 TABLET BY MOUTH ONCE DAILY WITH SUPPER    Allergies  Allergen Reactions  . Benazepril     COUGH     SOCIAL HISTORY/FAMILY HISTORY   Reviewed in Epic:  Pertinent findings:  Social History   Tobacco Use  . Smoking status: Never Smoker  . Smokeless tobacco: Never Used  Vaping Use  . Vaping Use: Never used  Substance Use Topics  . Alcohol use: Yes    Alcohol/week: 4.0 standard drinks    Types: 4 Cans of beer per week  . Drug use: No    Social History   Social History Narrative   Divorced father of 1. Retired Education officer, [employer].   ~2 beers / day. Does not smoke.   Works out 6 days a weeks - cardio & weights. He used to run and play basketball prior to his hip surgery in 2007.   Currently dating.    OBJCTIVE -PE, EKG, labs   Wt Readings from Last 3 Encounters:  10/17/20 247 lb 9.6 oz (112.3 kg)  10/13/20 245 lb (111.1 kg)  03/01/20 253 lb (114.8 kg)    Physical Exam: BP (!) 152/90   Pulse 62   Ht 6\' 3"  (1.905 m)   Wt 247 lb 9.6 oz (112.3 kg)   BMI 30.95 kg/m  Physical Exam Vitals reviewed.  Constitutional:      General: He is not in acute distress.    Appearance: Normal appearance. He is obese. He is not ill-appearing or toxic-appearing.  HENT:     Head: Normocephalic and atraumatic.  Neck:     Vascular: No carotid bruit or JVD.  Cardiovascular:     Rate and Rhythm: Normal rate. Rhythm irregularly irregular.     Chest Wall: PMI is not displaced.     Pulses: Decreased pulses (Mildly decreased pedal pulses-partially because of edema.).     Heart sounds: Normal heart sounds. No murmur heard. No friction rub. No S4 sounds.   Pulmonary:     Effort: Pulmonary effort is normal. No respiratory distress.     Breath sounds: Normal breath sounds.  Chest:     Chest wall: No tenderness.  Musculoskeletal:        General: Swelling (Trace to 1+ bilateral LE edema to mid calf.) present.     Cervical back: Normal range of motion and neck supple.  Skin:    General: Skin is warm and dry.     Comments: Venous excoriations mild spider veins and varicose veins.  Neurological:     General: No focal deficit present.     Mental Status: He is alert and oriented to person, place, and time. Mental status is at baseline.  Psychiatric:        Mood and Affect: Mood normal.        Behavior: Behavior normal.        Thought Content: Thought content normal.        Judgment: Judgment normal.  Adult ECG Report  Rate: 62 ;   Rhythm: atrial fibrillation and Nonspecific IVCD.  Left axis deviation (-41 ).  Stable EKG.;   Narrative Interpretation: stable  Recent Labs: no new labs  Lab Results  Component Value Date   CHOL 173 03/03/2019   HDL 44 03/03/2019   LDLCALC 111 (H) 03/03/2019   TRIG 91 03/03/2019   CHOLHDL 3.9 03/03/2019   Lab Results  Component Value Date   CREATININE 0.89 09/01/2019   BUN 10 09/01/2019   NA 134 09/01/2019   K 4.3 09/01/2019   CL 91 (L) 09/01/2019   CO2 28 09/01/2019   CBC Latest Ref Rng & Units 10/06/2018 05/06/2018 04/11/2017  WBC 3.4 - 10.8 x10E3/uL 6.8 6.2 6.3  Hemoglobin 13.0 - 17.7 g/dL 14.3 14.2 15.3  Hematocrit 37.5 - 51.0 % 40.4 41.1 43.5  Platelets 150 - 450 x10E3/uL 300 343 292    Lab Results  Component Value Date   TSH 3.330 04/11/2017    ASSESSMENT/PLAN    Problem List Items Addressed This Visit    Permanent atrial fibrillation (HCC) CHA2DS2-VASc Score 3; On Xarelto - Primary (Chronic)    Heart rate controlled A. fib.  On carvedilol. Relatively asymptomatic.  No signs of chronotropic incompetence.  No bleeding issues on Xarelto. Refill prescription for 90 days.      Relevant Medications   valsartan (DIOVAN) 160 MG tablet   rivaroxaban (XARELTO) 20 MG TABS tablet   Other Relevant Orders   EKG 12-Lead (Completed)   Essential hypertension (Chronic)    Still elevated blood pressure..  We are adding chlorthalidone last visit.  Plan for now we will increase valsartan to 160 mg      Relevant Medications   valsartan (DIOVAN) 160 MG tablet   rivaroxaban (XARELTO) 20 MG TABS tablet   Venous stasis dermatitis (Chronic)    Follow-up at [facility] for severe deep venous insufficiency.  Continue to wear compression stockings and elevate feet.  PRN Lasix.  Thankfully, he is on Xarelto therefore alleviating risk for DVT.      Long term current use of anticoagulant therapy (Chronic)    CHA2DS2-VASc score is 3: Age, hypertension,      NICM  (nonischemic cardiomyopathy) (HCC) (Chronic)    Resolved follow-up echocardiogram.  No active CHF symptoms besides chronic edema.  Plan: Continue carvedilol, increase valsartan dose to 160 mg based on hypertension.  Continue as needed Lasix.      Relevant Medications   valsartan (DIOVAN) 160 MG tablet   rivaroxaban (XARELTO) 20 MG TABS tablet   Other Relevant Orders   EKG 12-Lead (Completed)       COVID-19 Education: The signs and symptoms of COVID-19 were discussed with the patient and how to seek care for testing (follow up with PCP or arrange E-visit).   The importance of social distancing and COVID-19 vaccination was discussed today.  The patient is practicing social distancing & Masking.   I spent a total of 22 minutes with the patient spent in direct patient consultation.  Additional time spent with chart review  / charting (studies, outside notes, etc): 12 min Total Time: 34 min   Current medicines are reviewed at length with the patient today.  (+/- concerns) n/a  This visit occurred during the SARS-CoV-2 public health emergency.  Safety protocols were in place, including screening questions prior to the visit, additional usage of staff PPE, and extensive cleaning of exam room while observing appropriate contact time as indicated for  disinfecting solutions.  Notice: This dictation was prepared with Dragon dictation along with smaller phrase technology. Any transcriptional errors that result from this process are unintentional and may not be corrected upon review.  Patient Instructions / Medication Changes & Studies & Tests Ordered   Patient Instructions  Medication Instructions:   increase Valsartan to 160 mg one tablet daily   *If you need a refill on your cardiac medications before your next appointment, please call your pharmacy*   Lab Work: Not needed     Testing/Procedures: Not needed   Follow-Up: At [facility], you and your health needs are our  priority.  As part of our continuing mission to provide you with exceptional heart care, we have created designated Provider Care Teams.  These Care Teams include your primary Cardiologist (physician) and Advanced Practice Providers (APPs -  Physician Assistants and Nurse Practitioners) who all work together to provide you with the care you need, when you need it.  We recommend signing up for the patient portal called "MyChart".  Sign up information is provided on this After Visit Summary.  MyChart is used to connect with patients for Virtual Visits (Telemedicine).  Patients are able to view lab/test results, encounter notes, upcoming appointments, etc.  Non-urgent messages can be sent to your provider as well.   To learn more about what you can do with MyChart, go to [email or web address].    Your next appointment:   12 month(s)  The format for your next appointment:   In Person  Provider:   Glenetta Hew, MD       Studies Ordered:   Orders Placed This Encounter  Procedures  . EKG 12-Lead     Glenetta Hew, M.D., M.S. Interventional Cardiologist   Pager # [phone] Phone # [phone] [address] [place], [place] [address]   Thank you for choosing Heartcare at [facility]!!

## 2020-10-17 NOTE — Patient Instructions (Signed)
Medication Instructions:   increase Valsartan to 160 mg one tablet daily   *If you need a refill on your cardiac medications before your next appointment, please call your pharmacy*   Lab Work: Not needed     Testing/Procedures: Not needed   Follow-Up: At [facility], you and your health needs are our priority.  As part of our continuing mission to provide you with exceptional heart care, we have created designated Provider Care Teams.  These Care Teams include your primary Cardiologist (physician) and Advanced Practice Providers (APPs -  Physician Assistants and Nurse Practitioners) who all work together to provide you with the care you need, when you need it.  We recommend signing up for the patient portal called "MyChart".  Sign up information is provided on this After Visit Summary.  MyChart is used to connect with patients for Virtual Visits (Telemedicine).  Patients are able to view lab/test results, encounter notes, upcoming appointments, etc.  Non-urgent messages can be sent to your provider as well.   To learn more about what you can do with MyChart, go to [email or web address].    Your next appointment:   12 month(s)  The format for your next appointment:   In Person  Provider:   Glenetta Hew, MD

## 2020-10-25 NOTE — Assessment & Plan Note (Signed)
Follow-up at [facility] for severe deep venous insufficiency.  Continue to wear compression stockings and elevate feet.  PRN Lasix.  Thankfully, he is on Xarelto therefore alleviating risk for DVT.

## 2020-10-25 NOTE — Assessment & Plan Note (Signed)
Still elevated blood pressure..  We are adding chlorthalidone last visit.  Plan for now we will increase valsartan to 160 mg

## 2020-10-25 NOTE — Assessment & Plan Note (Signed)
CHA2DS2-VASc score is 3: Age, hypertension,

## 2020-10-25 NOTE — Assessment & Plan Note (Signed)
Resolved follow-up echocardiogram.  No active CHF symptoms besides chronic edema.  Plan: Continue carvedilol, increase valsartan dose to 160 mg based on hypertension.  Continue as needed Lasix.

## 2020-10-25 NOTE — Assessment & Plan Note (Addendum)
Heart rate controlled A. fib.  On carvedilol. Relatively asymptomatic.  No signs of chronotropic incompetence.  No bleeding issues on Xarelto. Refill prescription for 90 days.

## 2020-11-03 ENCOUNTER — Other Ambulatory Visit: Payer: Self-pay | Admitting: Cardiology

## 2020-11-03 DIAGNOSIS — I10 Essential (primary) hypertension: Secondary | ICD-10-CM

## 2020-11-15 NOTE — Progress Notes (Signed)
Subjective:   ALVEN ALVERIO is a 75 y.o. male who presents for Medicare Annual/Subsequent preventive examination.  I connected with Shela Nevin today by telephone and verified that I am speaking with the correct person using two identifiers. Location patient: home Location provider: work Persons participating in the virtual visit: patient, provider.   I discussed the limitations, risks, security and privacy concerns of performing an evaluation and management service by telephone and the availability of in person appointments. I also discussed with the patient that there may be a patient responsible charge related to this service. The patient expressed understanding and verbally consented to this telephonic visit.    Interactive audio and video telecommunications were attempted between this provider and patient, however failed, due to patient having technical difficulties OR patient did not have access to video capability.  We continued and completed visit with audio only.   Review of Systems    [name]  Cardiac Risk Factors include: advanced age (>70men, >44 women);male gender;hypertension     Objective:    There were no vitals filed for this visit. There is no height or weight on file to calculate BMI.  Advanced Directives 11/16/2020 10/15/2019 10/03/2017 05/30/2015 08/19/2014 08/17/2014 07/23/2014  Does Patient Have a Medical Advance Directive? Yes Yes No No No Yes No  Type of Paramedic of [place];Living will [place];Living will - - - Living will -  Does patient want to make changes to medical advance directive? - - - - - No - Patient declined -  Copy of [place] in Chart? No - copy requested No - copy requested - - - - -  Would patient like information on creating a medical advance directive? - - No - Patient declined No - patient declined information - - No - patient declined information    Current Medications  (verified) Outpatient Encounter Medications as of 11/16/2020  Medication Sig  . carvedilol (COREG) 12.5 MG tablet TAKE 1 TABLET BY MOUTH TWICE A DAY  . chlorthalidone (HYGROTON) 25 MG tablet TAKE 1 TABLET BY MOUTH ONCE DAILY IN THEMORNING  . furosemide (LASIX) 20 MG tablet Take 1 tablet (20 mg total) by mouth daily as needed for edema (UP TO 3X QWEEK ONLY).  . Multiple Vitamin (MULTIVITAMIN) tablet Take 1 tablet by mouth daily.  . rivaroxaban (XARELTO) 20 MG TABS tablet TAKE 1 TABLET BY MOUTH ONCE DAILY WITH SUPPER  . valsartan (DIOVAN) 160 MG tablet Take 1 tablet (160 mg total) by mouth daily.   No facility-administered encounter medications on file as of 11/16/2020.    Allergies (verified) Benazepril   History: Past Medical History:  Diagnosis Date  . Basal cell carcinoma of right lower extremity 03/24/2015  . BPH with obstruction/lower urinary tract symptoms   . Chronic atrial fibrillation ([place])   . Dislocation of shoulder, anterior, right, closed 07/24/2014  . Echocardiogram abnormal    2009 moderate to severely dilated left atrium  . Erectile dysfunction   . Essential hypertension   . Gout   . History of chicken pox   . History of measles   . Hyperglycemia   . Hypogonadism in male   . Nocturia   . Peyronie's disease   . Traumatic tear of right rotator cuff 07/24/2014  . Venous stasis    chronic   Past Surgical History:  Procedure Laterality Date  . CARDIAC CATHETERIZATION  03/28/2004   normal coronaries, reduced EF at 25-30% (Dr. Gerrie Nordmann)  .  CARDIOVERSION, TEE guided  03/29/2004   Dr. Gerrie Nordmann   . COLONOSCOPY  05/28/2013   Dr. Rayann Heman  . HERNIA REPAIR  age 62  . KNEE SURGERY    . LUMBAR FUSION  age 28  . MOHS SURGERY Right 08/20/2019   ALA done by Ishmael Holter, MD [facility]  . MYOVIEW CARDIOVASCULAR STRESS TEST  07/2003   anterior wall thinning, LV systolic function depressed at 33%  . PILONIDAL CYST EXCISION    . Right Eyebrow Tumor Removed  age 21   . SHOULDER  ARTHROSCOPY WITH ROTATOR CUFF REPAIR AND SUBACROMIAL DECOMPRESSION Right 08/19/2014   Procedure: SHOULDER ARTHROSCOPY WITH ROTATOR CUFF REPAIR AND SUBACROMIAL DECOMPRESSION;  Surgeon: Lorn Junes, MD;  Location: [facility];  Service: Orthopedics;  Laterality: Right;  . TOTAL HIP ARTHROPLASTY Right 03/25/2006   Dr. Wanda Plump. [place]  . TRANSTHORACIC ECHOCARDIOGRAM  08/2008   EF=>55%, mild-mod dilated; LA mod-severely dilated; RA mild-mod dilated; mild calcif of MV, borderline MVP, trace MR; trace TR; mild pulm valve regurg    Family History  Problem Relation Age of Onset  . Hypertension Father   . Prostate cancer Father   . Bladder Cancer Father   . Hypertension Mother   . Stroke Maternal Grandfather   . Diabetes Paternal Grandfather   . Supraventricular tachycardia Brother   . Arthritis Sister        RA  . Diabetes Maternal Uncle        TYPE 2  . Supraventricular tachycardia Child   . Kidney disease Neg Hx   . Heart attack Neg Hx   . Colon cancer Neg Hx    Social History   Socioeconomic History  . Marital status: Divorced    Spouse name: Not on file  . Number of children: 1  . Years of education: Not on file  . Highest education level: Master's degree (e.g., MA, MS, MEng, MEd, MSW, MBA)  Occupational History  . Occupation: retired Pharmacist, hospital, taught PE and coached  Tobacco Use  . Smoking status: Never Smoker  . Smokeless tobacco: Never Used  Vaping Use  . Vaping Use: Never used  Substance and Sexual Activity  . Alcohol use: Yes    Alcohol/week: 4.0 standard drinks    Types: 4 Cans of beer per week  . Drug use: No  . Sexual activity: Not on file  Other Topics Concern  . Not on file  Social History Narrative   Divorced father of 1. Retired Education officer, museum.   ~2 beers / day. Does not smoke.   Works out 6 days a weeks - cardio & weights. He used to run and play basketball prior to his hip surgery in 2007.   Currently dating.   Social Determinants of Health    Financial Resource Strain: Low Risk   . Difficulty of Paying Living Expenses: Not hard at all  Food Insecurity: No Food Insecurity  . Worried About Charity fundraiser in the Last Year: Never true  . Ran Out of Food in the Last Year: Never true  Transportation Needs: No Transportation Needs  . Lack of Transportation (Medical): No  . Lack of Transportation (Non-Medical): No  Physical Activity: Insufficiently Active  . Days of Exercise per Week: 2 days  . Minutes of Exercise per Session: 30 min  Stress: No Stress Concern Present  . Feeling of Stress : Not at all  Social Connections: Moderately Isolated  . Frequency of Communication with Friends and Family: More than three  times a week  . Frequency of Social Gatherings with Friends and Family: More than three times a week  . Attends Religious Services: Never  . Active Member of Clubs or Organizations: No  . Attends Archivist Meetings: Never  . Marital Status: Living with partner    Tobacco Counseling Counseling given: Not Answered   Clinical Intake:  Pre-visit preparation completed: Yes  Pain : No/denies pain     Nutritional Risks: None Diabetes: No  How often do you need to have someone help you when you read instructions, pamphlets, or other written materials from your doctor or pharmacy?: 1 - Never  Diabetic? No  Interpreter Needed?: No  Information entered by :: [facility], LPN   Activities of Daily Living In your present state of health, do you have any difficulty performing the following activities: 11/16/2020  Hearing? N  Vision? N  Difficulty concentrating or making decisions? N  Walking or climbing stairs? Y  Comment Due to a-fib.  Dressing or bathing? N  Doing errands, shopping? N  Preparing Food and eating ? N  Using the Toilet? N  In the past six months, have you accidently leaked urine? N  Do you have problems with loss of bowel control? N  Managing your Medications? N  Managing your  Finances? N  Housekeeping or managing your Housekeeping? N  Some recent data might be hidden    Patient Care Team: Birdie Sons, MD as PCP - General (Family Medicine) Leonie Man, MD as PCP - Cardiology (Cardiology) Dasher, Rayvon Char, MD (Dermatology) Nori Riis, PA-C as Physician Assistant (Urology) System, Provider Not In  Indicate any recent Medical Services you may have received from other than [facility] providers in the past year (date may be approximate).     Assessment:   This is a routine wellness examination for Wilma.  Hearing/Vision screen No exam data present  Dietary issues and exercise activities discussed: Current Exercise Habits: Structured exercise class, Type of exercise: treadmill;walking;strength training/weights, Time (Minutes): 30 (to 45 minutes), Frequency (Times/Week): 2, Weekly Exercise (Minutes/Week): 60, Intensity: Moderate, Exercise limited by: None identified  Goals    . DIET - INCREASE WATER INTAKE     Recommend increasing water intake to 6-8 8 oz glasses a day.       Depression Screen PHQ 2/9 Scores 11/16/2020 10/15/2019 10/15/2019 10/06/2018 10/03/2017 10/03/2017 09/26/2016  PHQ - 2 Score 0 0 0 0 0 0 0  PHQ- 9 Score - - - 0 0 - -    Fall Risk Fall Risk  11/16/2020 10/15/2019 10/06/2018 10/03/2017 09/26/2016  Falls in the past year? 0 1 1 No No  Number falls in past yr: 0 0 1 - -  Injury with Fall? 0 0 0 - -  Follow up - - Falls prevention discussed - -    FALL RISK PREVENTION PERTAINING TO THE HOME:  Any stairs in or around the home? Yes  If so, are there any without handrails? No  Home free of loose throw rugs in walkways, pet beds, electrical cords, etc? Yes  Adequate lighting in your home to reduce risk of falls? Yes   ASSISTIVE DEVICES UTILIZED TO PREVENT FALLS:  Life alert? No  Use of a cane, walker or w/c? No  Grab bars in the bathroom? No  Shower chair or bench in shower? No  Elevated toilet seat or a handicapped toilet?  Yes    Cognitive Function: Normal cognitive status assessed by observation by this Nurse  Health Advisor. No abnormalities found.          Immunizations Immunization History  Administered Date(s) Administered  . Fluad Quad(high Dose 65+) 06/22/2019  . Influenza, High Dose Seasonal PF 06/08/2020  . Influenza-Unspecified 06/25/2017, 06/22/2018  . PFIZER(Purple Top)SARS-COV-2 Vaccination 11/23/2019, 12/14/2019  . Pneumococcal Conjugate-13 09/20/2014  . Pneumococcal Polysaccharide-23 09/26/2015  . Tdap 03/27/2011  . Zoster 03/24/2010  . Zoster Recombinat (Shingrix) 10/14/2018, 12/22/2018    TDAP status: Up to date  Flu Vaccine status: Up to date  Pneumococcal vaccine status: Up to date  Covid-19 vaccine status: Completed vaccines  Qualifies for Shingles Vaccine? Yes   Zostavax completed Yes   Shingrix Completed?: Yes  Screening Tests Health Maintenance  Topic Date Due  . COVID-19 Vaccine (3 - Pfizer risk 4-dose series) 01/11/2020  . TETANUS/TDAP  03/26/2021  . COLONOSCOPY (Pts 45-60yrs Insurance coverage will need to be confirmed)  05/29/2023  . INFLUENZA VACCINE  Completed  . Hepatitis C Screening  Completed  . PNA vac Low Risk Adult  Completed    Health Maintenance  Health Maintenance Due  Topic Date Due  . COVID-19 Vaccine (3 - Pfizer risk 4-dose series) 01/11/2020    Colorectal cancer screening: Type of screening: Colonoscopy. Completed 05/28/13. Repeat every 10 years  Lung Cancer Screening: (Low Dose CT Chest recommended if Age 77-80 years, 30 pack-year currently smoking OR have quit w/in 15years.) does not qualify.   Additional Screening:  Hepatitis C Screening: Up to date  Vision Screening: Recommended annual ophthalmology exams for early detection of glaucoma and other disorders of the eye. Is the patient up to date with their annual eye exam?  Yes  Who is the provider or what is the name of the office in which the patient attends annual eye exams?  [facility] If pt is not established with a provider, would they like to be referred to a provider to establish care? No .   Dental Screening: Recommended annual dental exams for proper oral hygiene  Community Resource Referral / Chronic Care Management: CRR required this visit?  No   CCM required this visit?  No      Plan:     I have personally reviewed and noted the following in the patient's chart:   . Medical and social history . Use of alcohol, tobacco or illicit drugs  . Current medications and supplements . Functional ability and status . Nutritional status . Physical activity . Advanced directives . List of other physicians . Hospitalizations, surgeries, and ER visits in previous 12 months . Vitals . Screenings to include cognitive, depression, and falls . Referrals and appointments  In addition, I have reviewed and discussed with patient certain preventive protocols, quality metrics, and best practice recommendations. A written personalized care plan for preventive services as well as general preventive health recommendations were provided to patient.     Kenyata Guess [place], [place]   10/16/3788   Nurse Notes: None.

## 2020-11-16 ENCOUNTER — Ambulatory Visit (INDEPENDENT_AMBULATORY_CARE_PROVIDER_SITE_OTHER): Payer: Medicare PPO

## 2020-11-16 ENCOUNTER — Other Ambulatory Visit: Payer: Self-pay

## 2020-11-16 DIAGNOSIS — Z0000 Encounter for general adult medical examination without abnormal findings: Secondary | ICD-10-CM | POA: Diagnosis not present

## 2020-11-16 NOTE — Patient Instructions (Signed)
[name] , Thank you for taking time to come for your Medicare Wellness Visit. I appreciate your ongoing commitment to your health goals. Please review the following plan we discussed and let me know if I can assist you in the future.   Screening recommendations/referrals: Colonoscopy: Up to date, due 05/2023 Recommended yearly ophthalmology/optometry visit for glaucoma screening and checkup Recommended yearly dental visit for hygiene and checkup  Vaccinations: Influenza vaccine: Done 06/2020 Pneumococcal vaccine: Completed series Tdap vaccine: Up to date, due 03/2021 Shingles vaccine: Completed series    Advanced directives: Please bring a copy of your POA (Power of [place]) and/or Living Will to your next appointment.   Conditions/risks identified: Recommend increasing water intake to 6-8 8 oz glasses a day.   Next appointment: 11/18/20 @ 10:00 AM with Dr Caryn Section   Preventive Care 75 Years and Older, Male Preventive care refers to lifestyle choices and visits with your health care provider that can promote health and wellness. What does preventive care include?  A yearly physical exam. This is also called an annual well check.  Dental exams once or twice a year.  Routine eye exams. Ask your health care provider how often you should have your eyes checked.  Personal lifestyle choices, including:  Daily care of your teeth and gums.  Regular physical activity.  Eating a healthy diet.  Avoiding tobacco and drug use.  Limiting alcohol use.  Practicing safe sex.  Taking low doses of aspirin every day.  Taking vitamin and mineral supplements as recommended by your health care provider. What happens during an annual well check? The services and screenings done by your health care provider during your annual well check will depend on your age, overall health, lifestyle risk factors, and family history of disease. Counseling  Your health care provider may ask you questions  about your:  Alcohol use.  Tobacco use.  Drug use.  Emotional well-being.  Home and relationship well-being.  Sexual activity.  Eating habits.  History of falls.  Memory and ability to understand (cognition).  Work and work Statistician. Screening  You may have the following tests or measurements:  Height, weight, and BMI.  Blood pressure.  Lipid and cholesterol levels. These may be checked every 5 years, or more frequently if you are over 28 years old.  Skin check.  Lung cancer screening. You may have this screening every year starting at age 29 if you have a 30-pack-year history of smoking and currently smoke or have quit within the past 15 years.  Fecal occult blood test (FOBT) of the stool. You may have this test every year starting at age 12.  Flexible sigmoidoscopy or colonoscopy. You may have a sigmoidoscopy every 5 years or a colonoscopy every 10 years starting at age 5.  Prostate cancer screening. Recommendations will vary depending on your family history and other risks.  Hepatitis C blood test.  Hepatitis B blood test.  Sexually transmitted disease (STD) testing.  Diabetes screening. This is done by checking your blood sugar (glucose) after you have not eaten for a while (fasting). You may have this done every 1-3 years.  Abdominal aortic aneurysm (AAA) screening. You may need this if you are a current or former smoker.  Osteoporosis. You may be screened starting at age 48 if you are at high risk. Talk with your health care provider about your test results, treatment options, and if necessary, the need for more tests. Vaccines  Your health care provider may recommend certain vaccines,  such as:  Influenza vaccine. This is recommended every year.  Tetanus, diphtheria, and acellular pertussis (Tdap, Td) vaccine. You may need a Td booster every 10 years.  Zoster vaccine. You may need this after age 38.  Pneumococcal 13-valent conjugate (PCV13)  vaccine. One dose is recommended after age 29.  Pneumococcal polysaccharide (PPSV23) vaccine. One dose is recommended after age 37. Talk to your health care provider about which screenings and vaccines you need and how often you need them. This information is not intended to replace advice given to you by your health care provider. Make sure you discuss any questions you have with your health care provider. Document Released: 10/21/2015 Document Revised: 06/13/2016 Document Reviewed: 07/26/2015 Elsevier Interactive Patient Education  2017 [place] Prevention in the Home Falls can cause injuries. They can happen to people of all ages. There are many things you can do to make your home safe and to help prevent falls. What can I do on the outside of my home?  Regularly fix the edges of walkways and driveways and fix any cracks.  Remove anything that might make you trip as you walk through a door, such as a raised step or threshold.  Trim any bushes or trees on the path to your home.  Use bright outdoor lighting.  Clear any walking paths of anything that might make someone trip, such as rocks or tools.  Regularly check to see if handrails are loose or broken. Make sure that both sides of any steps have handrails.  Any raised decks and porches should have guardrails on the edges.  Have any leaves, snow, or ice cleared regularly.  Use sand or salt on walking paths during winter.  Clean up any spills in your garage right away. This includes oil or grease spills. What can I do in the bathroom?  Use night lights.  Install grab bars by the toilet and in the tub and shower. Do not use towel bars as grab bars.  Use non-skid mats or decals in the tub or shower.  If you need to sit down in the shower, use a plastic, non-slip stool.  Keep the floor dry. Clean up any water that spills on the floor as soon as it happens.  Remove soap buildup in the tub or shower  regularly.  Attach bath mats securely with double-sided non-slip rug tape.  Do not have throw rugs and other things on the floor that can make you trip. What can I do in the bedroom?  Use night lights.  Make sure that you have a light by your bed that is easy to reach.  Do not use any sheets or blankets that are too big for your bed. They should not hang down onto the floor.  Have a firm chair that has side arms. You can use this for support while you get dressed.  Do not have throw rugs and other things on the floor that can make you trip. What can I do in the kitchen?  Clean up any spills right away.  Avoid walking on wet floors.  Keep items that you use a lot in easy-to-reach places.  If you need to reach something above you, use a strong step stool that has a grab bar.  Keep electrical cords out of the way.  Do not use floor polish or wax that makes floors slippery. If you must use wax, use non-skid floor wax.  Do not have throw rugs and other things on  the floor that can make you trip. What can I do with my stairs?  Do not leave any items on the stairs.  Make sure that there are handrails on both sides of the stairs and use them. Fix handrails that are broken or loose. Make sure that handrails are as long as the stairways.  Check any carpeting to make sure that it is firmly attached to the stairs. Fix any carpet that is loose or worn.  Avoid having throw rugs at the top or bottom of the stairs. If you do have throw rugs, attach them to the floor with carpet tape.  Make sure that you have a light switch at the top of the stairs and the bottom of the stairs. If you do not have them, ask someone to add them for you. What else can I do to help prevent falls?  Wear shoes that:  Do not have high heels.  Have rubber bottoms.  Are comfortable and fit you well.  Are closed at the toe. Do not wear sandals.  If you use a stepladder:  Make sure that it is fully  opened. Do not climb a closed stepladder.  Make sure that both sides of the stepladder are locked into place.  Ask someone to hold it for you, if possible.  Clearly mark and make sure that you can see:  Any grab bars or handrails.  First and last steps.  Where the edge of each step is.  Use tools that help you move around (mobility aids) if they are needed. These include:  Canes.  Walkers.  Scooters.  Crutches.  Turn on the lights when you go into a dark area. Replace any light bulbs as soon as they burn out.  Set up your furniture so you have a clear path. Avoid moving your furniture around.  If any of your floors are uneven, fix them.  If there are any pets around you, be aware of where they are.  Review your medicines with your doctor. Some medicines can make you feel dizzy. This can increase your chance of falling. Ask your doctor what other things that you can do to help prevent falls. This information is not intended to replace advice given to you by your health care provider. Make sure you discuss any questions you have with your health care provider. Document Released: 07/21/2009 Document Revised: 03/01/2016 Document Reviewed: 10/29/2014 Elsevier Interactive Patient Education  2017 [employer].

## 2020-11-18 ENCOUNTER — Other Ambulatory Visit: Payer: Self-pay

## 2020-11-18 ENCOUNTER — Encounter: Payer: Self-pay | Admitting: Family Medicine

## 2020-11-18 ENCOUNTER — Ambulatory Visit (INDEPENDENT_AMBULATORY_CARE_PROVIDER_SITE_OTHER): Payer: Medicare PPO | Admitting: Family Medicine

## 2020-11-18 VITALS — BP 134/71 | HR 55 | Temp 97.3°F | Resp 16 | Ht 75.0 in | Wt 252.4 lb

## 2020-11-18 DIAGNOSIS — D6869 Other thrombophilia: Secondary | ICD-10-CM | POA: Diagnosis not present

## 2020-11-18 DIAGNOSIS — Z0000 Encounter for general adult medical examination without abnormal findings: Secondary | ICD-10-CM

## 2020-11-18 DIAGNOSIS — I10 Essential (primary) hypertension: Secondary | ICD-10-CM | POA: Diagnosis not present

## 2020-11-18 DIAGNOSIS — N281 Cyst of kidney, acquired: Secondary | ICD-10-CM | POA: Diagnosis not present

## 2020-11-18 DIAGNOSIS — R739 Hyperglycemia, unspecified: Secondary | ICD-10-CM | POA: Diagnosis not present

## 2020-11-18 NOTE — Patient Instructions (Addendum)
.   Please review the attached list of medications and notify my office if there are any errors.   You are due for a Tdap (tetanus-diptheria-pertussis vaccine) which protects you from tetanus and whooping cough. Please check with your insurance plan or pharmacy regarding coverage for this vaccine.

## 2020-11-18 NOTE — Progress Notes (Signed)
Complete physical exam   Patient: [name]   DOB: January 11, 1946   75 y.o. Male  MRN: [id number] Visit Date: 11/18/2020  Today's healthcare provider: [name], MD   Chief Complaint  Patient presents with  . Annual Exam   Subjective    [name] is a 75 y.o. male who presents today for a complete physical exam.  He reports consuming a general diet. Home exercise routine includes walking and weight lifting exercise machines. He generally feels fairly well. He reports sleeping fairly well. He does not have additional problems to discuss today.   Had AWV with [facility] on 11/16/2020.   HPI     Past Medical History:  Diagnosis Date  . Basal cell carcinoma of right lower extremity 03/24/2015  . BPH with obstruction/lower urinary tract symptoms   . Chronic atrial fibrillation ([place])   . Dislocation of shoulder, anterior, right, closed 07/24/2014  . Echocardiogram abnormal    2009 moderate to severely dilated left atrium  . Erectile dysfunction   . Essential hypertension   . Gout   . History of chicken pox   . History of measles   . Hyperglycemia   . Hypogonadism in male   . Nocturia   . Peyronie's disease   . Traumatic tear of right rotator cuff 07/24/2014  . Venous stasis    chronic   Past Surgical History:  Procedure Laterality Date  . CARDIAC CATHETERIZATION  03/28/2004   normal coronaries, reduced EF at 25-30% (Dr. Gerrie Nordmann)  . CARDIOVERSION, TEE guided  03/29/2004   Dr. Gerrie Nordmann   . COLONOSCOPY  05/28/2013   Dr. Rayann Heman  . HERNIA REPAIR  age 37  . KNEE SURGERY    . LUMBAR FUSION  age 86  . MOHS SURGERY Right 08/20/2019   ALA done by Ishmael Holter, MD [facility]  . MYOVIEW CARDIOVASCULAR STRESS TEST  07/2003   anterior wall thinning, LV systolic function depressed at 33%  . PILONIDAL CYST EXCISION    . Right Eyebrow Tumor Removed  age 52   . SHOULDER ARTHROSCOPY WITH ROTATOR CUFF REPAIR AND SUBACROMIAL DECOMPRESSION Right 08/19/2014   Procedure:  SHOULDER ARTHROSCOPY WITH ROTATOR CUFF REPAIR AND SUBACROMIAL DECOMPRESSION;  Surgeon: Lorn Junes, MD;  Location: [place];  Service: Orthopedics;  Laterality: Right;  . TOTAL HIP ARTHROPLASTY Right 03/25/2006   Dr. Wanda Plump. [place]  . TRANSTHORACIC ECHOCARDIOGRAM  08/2008   EF=>55%, mild-mod dilated; LA mod-severely dilated; RA mild-mod dilated; mild calcif of MV, borderline MVP, trace MR; trace TR; mild pulm valve regurg    Social History   Socioeconomic History  . Marital status: Divorced    Spouse name: Not on file  . Number of children: 1  . Years of education: Not on file  . Highest education level: Master's degree (e.g., MA, MS, MEng, MEd, MSW, MBA)  Occupational History  . Occupation: retired Pharmacist, hospital, taught PE and coached  Tobacco Use  . Smoking status: Never Smoker  . Smokeless tobacco: Never Used  Vaping Use  . Vaping Use: Never used  Substance and Sexual Activity  . Alcohol use: Yes    Alcohol/week: 4.0 standard drinks    Types: 4 Cans of beer per week  . Drug use: No  . Sexual activity: Not on file  Other Topics Concern  . Not on file  Social History Narrative   Divorced father of 1. Retired Education officer, museum.   ~2 beers / day. Does not smoke.  Works out 6 days a weeks - cardio & weights. He used to run and play basketball prior to his hip surgery in 2007.   Currently dating.   Social Determinants of Health   Financial Resource Strain: Low Risk   . Difficulty of Paying Living Expenses: Not hard at all  Food Insecurity: No Food Insecurity  . Worried About Charity fundraiser in the Last Year: Never true  . Ran Out of Food in the Last Year: Never true  Transportation Needs: No Transportation Needs  . Lack of Transportation (Medical): No  . Lack of Transportation (Non-Medical): No  Physical Activity: Insufficiently Active  . Days of Exercise per Week: 2 days  . Minutes of Exercise per Session: 30 min  Stress: No Stress Concern Present  .  Feeling of Stress : Not at all  Social Connections: Moderately Isolated  . Frequency of Communication with Friends and Family: More than three times a week  . Frequency of Social Gatherings with Friends and Family: More than three times a week  . Attends Religious Services: Never  . Active Member of Clubs or Organizations: No  . Attends Archivist Meetings: Never  . Marital Status: Living with partner  Intimate Partner Violence: Not At Risk  . Fear of Current or Ex-Partner: No  . Emotionally Abused: No  . Physically Abused: No  . Sexually Abused: No   Family Status  Relation Name Status  . Father  Deceased at age 72  . Mother  Alive       has back problems, borderline Diabetic  . MGF  Deceased at age 66       stroke at 37  . PGF  Deceased at age 39       diabetes  . Brother  Alive  . MGM  Deceased at age 28  . PGM  Deceased at age 59  . Sister  Alive  . Mat Uncle  Deceased  . Brother  Alive  . Sister  Alive  . Child  (Not Specified)  . Neg Hx  (Not Specified)   Family History  Problem Relation Age of Onset  . Hypertension Father   . Prostate cancer Father   . Bladder Cancer Father   . Hypertension Mother   . Stroke Maternal Grandfather   . Diabetes Paternal Grandfather   . Supraventricular tachycardia Brother   . Arthritis Sister        RA  . Diabetes Maternal Uncle        TYPE 2  . Supraventricular tachycardia Child   . Kidney disease Neg Hx   . Heart attack Neg Hx   . Colon cancer Neg Hx    Allergies  Allergen Reactions  . Benazepril     COUGH     Patient Care Team: Birdie Sons, MD as PCP - General (Family Medicine) Leonie Man, MD as PCP - Cardiology (Cardiology) Dasher, Rayvon Char, MD (Dermatology) Nori Riis PA-C as Physician Assistant (Urology) System, Provider Not In   Medications: Outpatient Medications Prior to Visit  Medication Sig  . carvedilol (COREG) 12.5 MG tablet TAKE 1 TABLET BY MOUTH TWICE A DAY  .  chlorthalidone (HYGROTON) 25 MG tablet TAKE 1 TABLET BY MOUTH ONCE DAILY IN THEMORNING  . Multiple Vitamin (MULTIVITAMIN) tablet Take 1 tablet by mouth daily.  . rivaroxaban (XARELTO) 20 MG TABS tablet TAKE 1 TABLET BY MOUTH ONCE DAILY WITH SUPPER  . valsartan (DIOVAN) 160 MG tablet Take 1  tablet (160 mg total) by mouth daily.  . furosemide (LASIX) 20 MG tablet Take 1 tablet (20 mg total) by mouth daily as needed for edema (UP TO 3X QWEEK ONLY).   No facility-administered medications prior to visit.    Review of Systems  Constitutional: Negative for appetite change, chills, fatigue and fever.  HENT: Negative for congestion, ear pain, hearing loss, nosebleeds and trouble swallowing.   Eyes: Negative for pain and visual disturbance.  Respiratory: Negative for cough, chest tightness and shortness of breath.   Cardiovascular: Negative for chest pain, palpitations and leg swelling.  Gastrointestinal: Negative for abdominal pain, blood in stool, constipation, diarrhea, nausea and vomiting.  Endocrine: Negative for polydipsia, polyphagia and polyuria.  Genitourinary: Negative for dysuria and flank pain.  Musculoskeletal: Negative for arthralgias, back pain, joint swelling, myalgias and neck stiffness.  Skin: Negative for color change, rash and wound.  Neurological: Negative for dizziness, tremors, seizures, speech difficulty, weakness, light-headedness and headaches.  Psychiatric/Behavioral: Negative for behavioral problems, confusion, decreased concentration, dysphoric mood and sleep disturbance. The patient is not nervous/anxious.   All other systems reviewed and are negative.    Objective    BP 134/71 (BP Location: Left Arm, Patient Position: Sitting, Cuff Size: Large)   Pulse (!) 55   Temp (!) 97.3 F (36.3 C) (Temporal)   Resp 16   Ht 6\' 3"  (1.905 m)   Wt 252 lb 6.4 oz (114.5 kg)   BMI 31.55 kg/m   Physical Exam   General Appearance:    Mildly obese male. Alert, cooperative, in  no acute distress, appears stated age  Head:    Normocephalic, without obvious abnormality, atraumatic  Eyes:    PERRL, conjunctiva/corneas clear, EOM's intact, fundi    benign, both eyes       Ears:    Normal TM's and external ear canals, both ears  Neck:   Supple, symmetrical, trachea midline, no adenopathy;       thyroid:  No enlargement/tenderness/nodules; no carotid   bruit or JVD  Back:     Symmetric, no curvature, ROM normal, no CVA tenderness  Lungs:     Clear to auscultation bilaterally, respirations unlabored  Chest wall:    No tenderness or deformity  Heart:    Bradycardic. Irregularly irregular rhythm. No murmurs, rubs, or gallops.  S1 and S2 normal  Abdomen:     Soft, non-tender, bowel sounds active all four quadrants,    no masses, no organomegaly  Genitalia:    deferred  Rectal:    deferred  Extremities:   All extremities are intact. No cyanosis or edema  Pulses:   2+ and symmetric all extremities  Skin:   Skin color, texture, turgor normal, no rashes or lesions  Lymph nodes:   Cervical, supraclavicular, and axillary nodes normal  Neurologic:   CNII-XII intact. Normal strength, sensation and reflexes      throughout     Last depression screening scores PHQ 2/9 Scores 11/16/2020 10/15/2019 10/15/2019  PHQ - 2 Score 0 0 0  PHQ- 9 Score - - -   Last fall risk screening Fall Risk  11/16/2020  Falls in the past year? 0  Number falls in past yr: 0  Injury with Fall? 0  Follow up -   Last Audit-C alcohol use screening Alcohol Use Disorder Test (AUDIT) 11/16/2020  1. How often do you have a drink containing alcohol? 3  2. How many drinks containing alcohol do you have on a typical day when you  are drinking? 0  3. How often do you have six or more drinks on one occasion? 0  AUDIT-C Score 3  4. How often during the last year have you found that you were not able to stop drinking once you had started? 0  5. How often during the last year have you failed to do what was normally  expected from you because of drinking? 0  6. How often during the last year have you needed a first drink in the morning to get yourself going after a heavy drinking session? 0  7. How often during the last year have you had a feeling of guilt of remorse after drinking? 0  8. How often during the last year have you been unable to remember what happened the night before because you had been drinking? 0  9. Have you or someone else been injured as a result of your drinking? 0  10. Has a relative or friend or a doctor or another health worker been concerned about your drinking or suggested you cut down? 0  Alcohol Use Disorder Identification Test Final Score (AUDIT) 3  Alcohol Brief Interventions/Follow-up AUDIT Score <7 follow-up not indicated   A score of 3 or more in women, and 4 or more in men indicates increased risk for alcohol abuse, EXCEPT if all of the points are from question 1   No results found for any visits on 11/18/20.  Assessment & Plan    Routine Health Maintenance and Physical Exam  Exercise Activities and Dietary recommendations Goals    . DIET - INCREASE WATER INTAKE     Recommend increasing water intake to 6-8 8 oz glasses a day.        Immunization History  Administered Date(s) Administered  . Fluad Quad(high Dose 65+) 06/22/2019  . Influenza, High Dose Seasonal PF 06/08/2020  . Influenza-Unspecified 06/25/2017, 06/22/2018  . PFIZER(Purple Top)SARS-COV-2 Vaccination 11/23/2019, 12/14/2019, 07/19/2020  . Pneumococcal Conjugate-13 09/20/2014  . Pneumococcal Polysaccharide-23 09/26/2015  . Tdap 03/27/2011  . Zoster 03/24/2010  . Zoster Recombinat (Shingrix) 10/14/2018, 12/22/2018    Health Maintenance  Topic Date Due  . COVID-19 Vaccine (4 - Booster for Pfizer series) 01/17/2021  . TETANUS/TDAP  03/26/2021  . COLONOSCOPY (Pts 45-61yrs Insurance coverage will need to be confirmed)  05/29/2023  . INFLUENZA VACCINE  Completed  . Hepatitis C Screening  Completed   . PNA vac Low Risk Adult  Completed    Discussed health benefits of physical activity, and encouraged him to engage in regular exercise appropriate for his age and condition.  Annual skin exam by Dr. Evorn Gong done recently.  Up to date on annual eye exam.  Reminded he is due for tetanus vaccine this year.   1. Acquired thrombophilia ([place]) Doing well on NOAC  2. Essential hypertension Well controlled.  Continue current medications.  Tolerating recent increase in valsartan dose.  - Comprehensive metabolic panel - Lipid panel - TSH  3. Hyperglycemia  - Hemoglobin A1c  4. Complex renal cyst, left Not mentioned on follow up ultrasound 12/2019   No follow-ups on file.     The entirety of the information documented in the History of Present Illness, Review of Systems and Physical Exam were personally obtained by me. Portions of this information were initially documented by the CMA and reviewed by me for thoroughness and accuracy.      [name], MD  [facility] [phone] (phone) [phone] (fax)  [place]

## 2020-11-19 LAB — LIPID PANEL
Chol/HDL Ratio: 3.3 ratio (ref 0.0–5.0)
Cholesterol, Total: 195 mg/dL (ref 100–199)
HDL: 60 mg/dL (ref >39)
LDL Chol Calc (NIH): 118 mg/dL — ABNORMAL HIGH (ref 0–99)
Triglycerides: 96 mg/dL (ref 0–149)
VLDL Cholesterol Cal: 17 mg/dL (ref 5–40)

## 2020-11-19 LAB — COMPREHENSIVE METABOLIC PANEL
ALT: 24 IU/L (ref 0–44)
AST: 20 IU/L (ref 0–40)
Albumin/Globulin Ratio: 1.6 (ref 1.2–2.2)
Albumin: 4.7 g/dL (ref 3.7–4.7)
Alkaline Phosphatase: 88 IU/L (ref 44–121)
BUN/Creatinine Ratio: 14 (ref 10–24)
BUN: 15 mg/dL (ref 8–27)
Bilirubin Total: 0.7 mg/dL (ref 0.0–1.2)
CO2: 24 mmol/L (ref 20–29)
Calcium: 10.4 mg/dL — ABNORMAL HIGH (ref 8.6–10.2)
Chloride: 101 mmol/L (ref 96–106)
Creatinine, Ser: 1.06 mg/dL (ref 0.76–1.27)
GFR calc Af Amer: 81 mL/min/{1.73_m2} (ref >59)
GFR calc non Af Amer: 70 mL/min/{1.73_m2} (ref >59)
Globulin, Total: 3 g/dL (ref 1.5–4.5)
Glucose: 129 mg/dL — ABNORMAL HIGH (ref 65–99)
Potassium: 4.4 mmol/L (ref 3.5–5.2)
Sodium: 144 mmol/L (ref 134–144)
Total Protein: 7.7 g/dL (ref 6.0–8.5)

## 2020-11-19 LAB — HEMOGLOBIN A1C
Est. average glucose Bld gHb Est-mCnc: 117 mg/dL
Hgb A1c MFr Bld: 5.7 % — ABNORMAL HIGH (ref 4.8–5.6)

## 2020-11-19 LAB — TSH: TSH: 4.45 u[IU]/mL (ref 0.450–4.500)

## 2021-01-24 DIAGNOSIS — K760 Fatty (change of) liver, not elsewhere classified: Secondary | ICD-10-CM | POA: Diagnosis not present

## 2021-02-16 ENCOUNTER — Telehealth: Payer: Self-pay | Admitting: Cardiology

## 2021-02-16 NOTE — Telephone Encounter (Signed)
Spoke to [employer] who report they have a Medication therapy management program through [facility] and insurance require most recent BP. Pharmacy advised are most recent BP is from 10/17/20. Pharmacy verbalized understanding.

## 2021-02-16 NOTE — Telephone Encounter (Signed)
Landon from [place] is calling in regards to obtaining Mr. Morgans most recent BP reading.

## 2021-02-23 ENCOUNTER — Telehealth: Payer: Self-pay | Admitting: Cardiology

## 2021-02-23 DIAGNOSIS — I872 Venous insufficiency (chronic) (peripheral): Secondary | ICD-10-CM

## 2021-02-23 MED ORDER — FUROSEMIDE 20 MG PO TABS: 20.0000 mg | ORAL_TABLET | Freq: Every day | ORAL | 3 refills | Status: DC | PRN

## 2021-02-23 NOTE — Addendum Note (Signed)
Addended by: Hinton Dyer on: 02/23/2021 04:14 PM   Modules accepted: Orders

## 2021-02-23 NOTE — Telephone Encounter (Signed)
*  STAT* If patient is at the pharmacy, call can be transferred to refill team.   1. Which medications need to be refilled? (please list name of each medication and dose if known) furosemide (LASIX) 20 MG tablet(Expired)  2. Which pharmacy/location (including street and city if local pharmacy) is medication to be sent to?   3. Do they need a 30 day or 90 day supply? 90 day supply  PT is out of this medication. Script has expired

## 2021-04-04 DIAGNOSIS — L308 Other specified dermatitis: Secondary | ICD-10-CM | POA: Diagnosis not present

## 2021-04-04 DIAGNOSIS — D485 Neoplasm of uncertain behavior of skin: Secondary | ICD-10-CM | POA: Diagnosis not present

## 2021-04-04 DIAGNOSIS — B372 Candidiasis of skin and nail: Secondary | ICD-10-CM | POA: Diagnosis not present

## 2021-04-04 DIAGNOSIS — X32XXXA Exposure to sunlight, initial encounter: Secondary | ICD-10-CM | POA: Diagnosis not present

## 2021-04-04 DIAGNOSIS — L570 Actinic keratosis: Secondary | ICD-10-CM | POA: Diagnosis not present

## 2021-04-04 DIAGNOSIS — C44722 Squamous cell carcinoma of skin of right lower limb, including hip: Secondary | ICD-10-CM | POA: Diagnosis not present

## 2021-04-04 DIAGNOSIS — D0471 Carcinoma in situ of skin of right lower limb, including hip: Secondary | ICD-10-CM | POA: Diagnosis not present

## 2021-04-04 DIAGNOSIS — D0462 Carcinoma in situ of skin of left upper limb, including shoulder: Secondary | ICD-10-CM | POA: Diagnosis not present

## 2021-04-27 ENCOUNTER — Ambulatory Visit: Payer: Medicare PPO | Admitting: Cardiology

## 2021-04-27 ENCOUNTER — Other Ambulatory Visit: Payer: Self-pay

## 2021-04-27 ENCOUNTER — Encounter: Payer: Self-pay | Admitting: Cardiology

## 2021-04-27 VITALS — BP 122/70 | HR 56 | Ht 75.0 in | Wt 254.4 lb

## 2021-04-27 DIAGNOSIS — E785 Hyperlipidemia, unspecified: Secondary | ICD-10-CM

## 2021-04-27 DIAGNOSIS — I4821 Permanent atrial fibrillation: Secondary | ICD-10-CM

## 2021-04-27 DIAGNOSIS — I739 Peripheral vascular disease, unspecified: Secondary | ICD-10-CM

## 2021-04-27 DIAGNOSIS — Z7901 Long term (current) use of anticoagulants: Secondary | ICD-10-CM

## 2021-04-27 DIAGNOSIS — D6869 Other thrombophilia: Secondary | ICD-10-CM

## 2021-04-27 DIAGNOSIS — I872 Venous insufficiency (chronic) (peripheral): Secondary | ICD-10-CM | POA: Diagnosis not present

## 2021-04-27 DIAGNOSIS — I10 Essential (primary) hypertension: Secondary | ICD-10-CM

## 2021-04-27 NOTE — Progress Notes (Signed)
Primary Care Provider: Birdie Sons, MD Cardiologist: Glenetta Hew, MD Electrophysiologist: None  Clinic Note: Chief Complaint  Patient presents with   Follow-up    6 months.  Doing well.   Atrial Fibrillation    No real signs of irregular heartbeats.  Sometimes he feels heart rate going fast.  Still notes exertional dyspnea with walking uphill or significant exertion i.e. carrying something upstairs).   ===================================  ASSESSMENT/PLAN   Problem List Items Addressed This Visit     Permanent atrial fibrillation (HCC) CHA2DS2-VASc Score 3; On Xarelto - Primary (Chronic)    We have simply chosen to treat with rate control.  He is only symptomatic with exertion.  No real signs of chronotropic incompetence.  Continue carvedilol Toprol 5 mg twice daily. Continue rivaroxaban for DOAC anticoagulation.       Relevant Orders   EKG 12-Lead (Completed)   Essential hypertension (Chronic)   Venous stasis dermatitis (Chronic)    Venostasis dermatitis both legs.  He has venous Berry well-controlled with his current dose of Lasix which he maybe takes to 3 days off and on.  We talked about using support stockings which he cannot do in the summertime for this too hot.  Hopefully he will wear it in the wintertime, but also make sure he uses foot elevation.       Acquired thrombophilia ([place])    Not really true.  Atrial ablation necessarily leads a stagnant blood flow and therefore potential for thrombus.  It is not thrombophilia.  Continue DOAC.       Peripheral vascular disease (HCC) (Chronic)    No real claudication symptoms continue cardiovascular risk factor modification with blood pressure and lipid control. He is asked not on statin. Last LDL was 118.  May benefit from statin.  Will defer to PCP.       Long term current use of anticoagulant therapy (Chronic)    Now on Xarelto for maintenance.  No bleeding issues.  We discussed that he can hold Xarelto  for necessary procedures -> usually 2 days of holding Xarelto sufficient for most procedures, but for more high risk procedures 3 days.        Relevant Orders   EKG 12-Lead (Completed)   Hyperlipidemia with target LDL less than 100 (Chronic)    Last LDL was 118.  I suspect he probably will need a statin.  He has been reluctant to do so in the past.  Would anticipate discussion at next visit.        ===================================  HPI:    [name] is a 75 y.o. male with a Pertinent PMH notable for Permanent A. 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  [name] was last seen on October 25, 2020.  He says he is actually feeling well.  Doing "wonderful.  Still gets short of breath walking up hills or if he flights of steps but this has been his baseline.  No chest pain or pressure.  He is usually able to continue going when he gets short of breath, just has to slow down to catch his breath.  No PND orthopnea or edema.  No real palpitations.  Recent Hospitalizations: None  Reviewed  CV studies:    The following studies were reviewed today: (if available, images/films reviewed: From Epic Chart or Care Everywhere)  None  Interval History:   [name] is  here for routine follow-up overall doing pretty well.  He says really he only gets short of breathIf he is going up an incline or upstairs. The only time he notes any palpitations when he is exerting self.He denies any chest pain or pressure with rest or exertion.  No PND, orthopnea or edema.  Trivial edema, but stable.  He does chores at [employer] off and on, and denies any exertional dyspnea or chest pain.  He has noted intermittent edema where his legs are swelling up but he will take Lasix for about 2 to 3 days may be 1 burst every week but otherwise edema is pretty well controlled.  CV Review of Symptoms  (Summary) Cardiovascular ROS: positive for - edema and This is only occasional and not routine.  Pretty well controlled with current dose of furosemide having to increase it once or twice a week. negative for - chest pain, edema, irregular heartbeat, orthopnea, paroxysmal nocturnal dyspnea, rapid heart rate, shortness of breath, or Syncope/near syncope or TIA/amaurosis fugax, claudication  REVIEWED OF SYSTEMS   Review of Systems  Constitutional:  Positive for malaise/fatigue (Has not been able to be as active as he used to be, but multifactorial.). Negative for weight loss.  HENT:  Negative for nosebleeds.   Respiratory:  Positive for shortness of breath (Baseline exertional dyspnea).   Cardiovascular:  Positive for leg swelling (Stable mild swelling). Negative for claudication.  Gastrointestinal:  Negative for blood in stool and melena.  Genitourinary:  Negative for dysuria and hematuria.  Musculoskeletal:  Positive for joint pain.  Neurological:  Positive for dizziness (Off-and-on) and focal weakness. Negative for loss of consciousness and weakness.  Endo/Heme/Allergies:  Positive for environmental allergies. Bruises/bleeds easily.  Psychiatric/Behavioral:  Negative for depression and memory loss. The patient is not nervous/anxious and does not have insomnia.    I have reviewed and (if needed) personally updated the patient's problem list, medications, allergies, past medical and surgical history, social and family history.   PAST MEDICAL HISTORY   Past Medical History:  Diagnosis Date   Basal cell carcinoma of right lower extremity 03/24/2015   BPH with obstruction/lower urinary tract symptoms    Chronic atrial fibrillation (HCC)    Dislocation of shoulder, anterior, right, closed 07/24/2014   Echocardiogram abnormal    2009 moderate to severely dilated left atrium   Erectile dysfunction    Essential hypertension    Gout    History of chicken pox    History of measles     Hyperglycemia    Hypogonadism in male    Nocturia    Peyronie's disease    Traumatic tear of right rotator cuff 07/24/2014   Venous stasis    chronic    PAST SURGICAL HISTORY   Past Surgical History:  Procedure Laterality Date   CARDIAC CATHETERIZATION  03/28/2004   normal coronaries, reduced EF at 25-30% (Dr. Gerrie Nordmann)   [place], TEE guided  03/29/2004   Dr. Gerrie Nordmann    COLONOSCOPY  05/28/2013   Dr. Rayann Heman   HERNIA REPAIR  age 38   [place]  age 80   MOHS SURGERY Right 08/20/2019   ALA done by Ishmael Holter, MD [facility]   [place]OVIEW CARDIOVASCULAR STRESS TEST  07/2003   anterior wall thinning, LV systolic function depressed at 33%   PILONIDAL CYST EXCISION     Right Eyebrow Tumor Removed  age 43    SHOULDER ARTHROSCOPY WITH ROTATOR CUFF REPAIR AND SUBACROMIAL  DECOMPRESSION Right 08/19/2014   Procedure: SHOULDER ARTHROSCOPY WITH ROTATOR CUFF REPAIR AND SUBACROMIAL DECOMPRESSION;  Surgeon: Lorn Junes, MD;  Location: [place];  Service: Orthopedics;  Laterality: Right;   TOTAL HIP ARTHROPLASTY Right 03/25/2006   Dr. Wanda Plump. [place]   TRANSTHORACIC ECHOCARDIOGRAM  08/2008   EF=>55%, mild-mod dilated; LA mod-severely dilated; RA mild-mod dilated; mild calcif of MV, borderline MVP, trace MR; trace TR; mild pulm valve regurg     Immunization History  Administered Date(s) Administered   Fluad Quad(high Dose 65+) 06/22/2019   Influenza, High Dose Seasonal PF 06/08/2020   Influenza-Unspecified 06/25/2017, 06/22/2018   PFIZER(Purple Top)SARS-COV-2 Vaccination 11/23/2019, 12/14/2019, 07/19/2020   Pneumococcal Conjugate-13 09/20/2014   Pneumococcal Polysaccharide-23 09/26/2015   Tdap 03/27/2011   Zoster Recombinat (Shingrix) 10/14/2018, 12/22/2018   Zoster, Live 03/24/2010    MEDICATIONS/ALLERGIES   Current Meds  Medication Sig   carvedilol (COREG) 12.5 MG tablet TAKE 1 TABLET BY MOUTH TWICE A DAY   chlorthalidone (HYGROTON) 25 MG tablet  TAKE 1 TABLET BY MOUTH ONCE DAILY IN THEMORNING   furosemide (LASIX) 20 MG tablet Take 1 tablet (20 mg total) by mouth daily as needed for edema (UP TO 3X QWEEK ONLY).   Multiple Vitamin (MULTIVITAMIN) tablet Take 1 tablet by mouth daily.   rivaroxaban (XARELTO) 20 MG TABS tablet TAKE 1 TABLET BY MOUTH ONCE DAILY WITH SUPPER   valsartan (DIOVAN) 160 MG tablet Take 1 tablet (160 mg total) by mouth daily.    Allergies  Allergen Reactions   Benazepril     COUGH     SOCIAL HISTORY/FAMILY HISTORY   Reviewed in Epic:  Pertinent findings:  Social History   Tobacco Use   Smoking status: Never   Smokeless tobacco: Never  Vaping Use   Vaping Use: Never used  Substance Use Topics   Alcohol use: Yes    Alcohol/week: 4.0 standard drinks    Types: 4 Cans of beer per week   Drug use: No   Social History   Social History Narrative   Divorced father of 1. Retired Education officer, museum.   ~2 beers / day. Does not smoke.   Works out 6 days a weeks - cardio & weights. He used to run and play basketball prior to his hip surgery in 2007.   Currently dating.    OBJCTIVE -PE, EKG, labs   Wt Readings from Last 3 Encounters:  04/27/21 254 lb 6.4 oz (115.4 kg)  11/18/20 252 lb 6.4 oz (114.5 kg)  10/17/20 247 lb 9.6 oz (112.3 kg)    Physical Exam: BP 122/70   Pulse (!) 56   Ht 6\' 3"  (1.905 m)   Wt 254 lb 6.4 oz (115.4 kg)   SpO2 99%   BMI 31.80 kg/m  Physical Exam Vitals reviewed.  Constitutional:      General: He is not in acute distress.    Appearance: Normal appearance. He is obese. He is not ill-appearing or toxic-appearing.  HENT:     Head: Normocephalic and atraumatic.  Neck:     Vascular: No carotid bruit, hepatojugular reflux or JVD.  Cardiovascular:     Rate and Rhythm: Normal rate. Rhythm irregularly irregular. No extrasystoles are present.    Chest Wall: PMI is not displaced.     Pulses: Decreased pulses (Mildly reduced pedal pulses).     Heart sounds: No murmur heard.    No gallop.  Pulmonary:     Effort: Pulmonary effort is normal. No respiratory distress.  Breath sounds: Normal breath sounds. No wheezing, rhonchi or rales.  Chest:     Chest wall: No tenderness.  Musculoskeletal:        General: Swelling (Trivial) present. No tenderness.     Cervical back: Normal range of motion and neck supple.  Skin:    General: Skin is warm.     Coloration: Skin is not pale.  Neurological:     General: No focal deficit present.     Mental Status: He is alert and oriented to person, place, and time.  Psychiatric:        Mood and Affect: Mood normal.        Behavior: Behavior normal.        Thought Content: Thought content normal.        Judgment: Judgment normal.     Adult ECG Report  Rate: 56 ;  Rhythm: atrial fibrillation and slow ventricular response.  Left atrial abnormality, nonspecific IVCD ;   Narrative Interpretation: Stable  Recent Labs: Reviewed Lab Results  Component Value Date   CHOL 195 11/18/2020   HDL 60 11/18/2020   LDLCALC 118 (H) 11/18/2020   TRIG 96 11/18/2020   CHOLHDL 3.3 11/18/2020   Lab Results  Component Value Date   CREATININE 1.06 11/18/2020   BUN 15 11/18/2020   NA 144 11/18/2020   K 4.4 11/18/2020   CL 101 11/18/2020   CO2 24 11/18/2020   CBC Latest Ref Rng & Units 10/06/2018 05/06/2018 04/11/2017  WBC 3.4 - 10.8 x10E3/uL 6.8 6.2 6.3  Hemoglobin 13.0 - 17.7 g/dL 14.3 14.2 15.3  Hematocrit 37.5 - 51.0 % 40.4 41.1 43.5  Platelets 150 - 450 x10E3/uL 300 343 292    Lab Results  Component Value Date   TSH 4.450 11/18/2020    ==================================================  COVID-19 Education: The signs and symptoms of COVID-19 were discussed with the patient and how to seek care for testing (follow up with PCP or arrange E-visit).    I spent a total of 25 minutes with the patient spent in direct patient consultation.  Additional time spent with chart review  / charting (studies, outside notes, etc): 17 min.   Lab results reviewed.  We discussed his blood pressure-CMA was unable to check blood pressure. Total Time: 42 min  Current medicines are reviewed at length with the patient today.  (+/- concerns) none  This visit occurred during the SARS-CoV-2 public health emergency.  Safety protocols were in place, including screening questions prior to the visit, additional usage of staff PPE, and extensive cleaning of exam room while observing appropriate contact time as indicated for disinfecting solutions.  Notice: This dictation was prepared with Dragon dictation along with smaller phrase technology. Any transcriptional errors that result from this process are unintentional and may not be corrected upon review.  Patient Instructions / Medication Changes & Studies & Tests Ordered   Patient Instructions  Medication Instructions:  No changes  *If you need a refill on your cardiac medications before your next appointment, please call your pharmacy*   Lab Work: Not needed If you have labs (blood work) drawn today and your tests are completely normal, you will receive your results only by: MyChart Message (if you have MyChart) OR A paper copy in the mail If you have any lab test that is abnormal or we need to change your treatment, we will call you to review the results.   Testing/Procedures:  Not needed  Follow-Up: At [facility], you and your health  needs are our priority.  As part of our continuing mission to provide you with exceptional heart care, we have created designated Provider Care Teams.  These Care Teams include your primary Cardiologist (physician) and Advanced Practice Providers (APPs -  Physician Assistants and Nurse Practitioners) who all work together to provide you with the care you need, when you need it.  We recommend signing up for the patient portal called "MyChart".  Sign up information is provided on this After Visit Summary.  MyChart is used to connect with patients for  Virtual Visits (Telemedicine).  Patients are able to view lab/test results, encounter notes, upcoming appointments, etc.  Non-urgent messages can be sent to your provider as well.   To learn more about what you can do with MyChart, go to [email or web address].    Your next appointment:   12 month(s)  The format for your next appointment:   In Person  Provider:   Glenetta Hew, MD      Studies Ordered:   Orders Placed This Encounter  Procedures   EKG 12-Lead      Glenetta Hew, M.D., M.S. Interventional Cardiologist   Pager # [phone] Phone # [phone] [address] [place], [place] [address]   Thank you for choosing Heartcare at [facility]!!

## 2021-04-27 NOTE — Patient Instructions (Signed)
Medication Instructions:  No changes *If you need a refill on your cardiac medications before your next appointment, please call your pharmacy*   Lab Work: Not needed If you have labs (blood work) drawn today and your tests are completely normal, you will receive your results only by: . MyChart Message (if you have MyChart) OR . A paper copy in the mail If you have any lab test that is abnormal or we need to change your treatment, we will call you to review the results.   Testing/Procedures: Not needed   Follow-Up: At [facility], you and your health needs are our priority.  As part of our continuing mission to provide you with exceptional heart care, we have created designated Provider Care Teams.  These Care Teams include your primary Cardiologist (physician) and Advanced Practice Providers (APPs -  Physician Assistants and Nurse Practitioners) who all work together to provide you with the care you need, when you need it.  We recommend signing up for the patient portal called "MyChart".  Sign up information is provided on this After Visit Summary.  MyChart is used to connect with patients for Virtual Visits (Telemedicine).  Patients are able to view lab/test results, encounter notes, upcoming appointments, etc.  Non-urgent messages can be sent to your provider as well.   To learn more about what you can do with MyChart, go to [email or web address].    Your next appointment:   12 month(s)  The format for your next appointment:   In Person  Provider:   David [name], MD     

## 2021-05-06 ENCOUNTER — Encounter: Payer: Self-pay | Admitting: Cardiology

## 2021-05-07 ENCOUNTER — Encounter: Payer: Self-pay | Admitting: Cardiology

## 2021-05-07 NOTE — Assessment & Plan Note (Signed)
Not really true.  Atrial ablation necessarily leads a stagnant blood flow and therefore potential for thrombus.  It is not thrombophilia.  Continue DOAC.

## 2021-05-07 NOTE — Assessment & Plan Note (Signed)
Last LDL was 118.  I suspect he probably will need a statin.  He has been reluctant to do so in the past.  Would anticipate discussion at next visit.

## 2021-05-07 NOTE — Assessment & Plan Note (Signed)
No real claudication symptoms continue cardiovascular risk factor modification with blood pressure and lipid control. He is asked not on statin. Last LDL was 118.  May benefit from statin.  Will defer to PCP.

## 2021-05-07 NOTE — Assessment & Plan Note (Signed)
Now on Xarelto for maintenance.  No bleeding issues.  We discussed that he can hold Xarelto for necessary procedures -> usually 2 days of holding Xarelto sufficient for most procedures, but for more high risk procedures 3 days.

## 2021-05-07 NOTE — Assessment & Plan Note (Signed)
We have simply chosen to treat with rate control.  He is only symptomatic with exertion.  No real signs of chronotropic incompetence.  Continue carvedilol Toprol 5 mg twice daily. Continue rivaroxaban for DOAC anticoagulation.

## 2021-05-07 NOTE — Assessment & Plan Note (Signed)
Venostasis dermatitis both legs.  He has venous Berry well-controlled with his current dose of Lasix which he maybe takes to 3 days off and on.  We talked about using support stockings which he cannot do in the summertime for this too hot.  Hopefully he will wear it in the wintertime, but also make sure he uses foot elevation.

## 2021-05-11 ENCOUNTER — Other Ambulatory Visit: Payer: Self-pay | Admitting: Cardiology

## 2021-05-30 DIAGNOSIS — L0101 Non-bullous impetigo: Secondary | ICD-10-CM | POA: Diagnosis not present

## 2021-05-30 DIAGNOSIS — D0462 Carcinoma in situ of skin of left upper limb, including shoulder: Secondary | ICD-10-CM | POA: Diagnosis not present

## 2021-06-13 DIAGNOSIS — L97219 Non-pressure chronic ulcer of right calf with unspecified severity: Secondary | ICD-10-CM | POA: Diagnosis not present

## 2021-06-13 DIAGNOSIS — L97829 Non-pressure chronic ulcer of other part of left lower leg with unspecified severity: Secondary | ICD-10-CM | POA: Diagnosis not present

## 2021-06-13 DIAGNOSIS — L97819 Non-pressure chronic ulcer of other part of right lower leg with unspecified severity: Secondary | ICD-10-CM | POA: Diagnosis not present

## 2021-08-08 DIAGNOSIS — D045 Carcinoma in situ of skin of trunk: Secondary | ICD-10-CM | POA: Diagnosis not present

## 2021-08-08 DIAGNOSIS — D485 Neoplasm of uncertain behavior of skin: Secondary | ICD-10-CM | POA: Diagnosis not present

## 2021-08-08 DIAGNOSIS — C44629 Squamous cell carcinoma of skin of left upper limb, including shoulder: Secondary | ICD-10-CM | POA: Diagnosis not present

## 2021-08-08 DIAGNOSIS — D2272 Melanocytic nevi of left lower limb, including hip: Secondary | ICD-10-CM | POA: Diagnosis not present

## 2021-08-08 DIAGNOSIS — L570 Actinic keratosis: Secondary | ICD-10-CM | POA: Diagnosis not present

## 2021-08-08 DIAGNOSIS — D225 Melanocytic nevi of trunk: Secondary | ICD-10-CM | POA: Diagnosis not present

## 2021-08-08 DIAGNOSIS — C44519 Basal cell carcinoma of skin of other part of trunk: Secondary | ICD-10-CM | POA: Diagnosis not present

## 2021-08-08 DIAGNOSIS — D2271 Melanocytic nevi of right lower limb, including hip: Secondary | ICD-10-CM | POA: Diagnosis not present

## 2021-08-08 DIAGNOSIS — D2262 Melanocytic nevi of left upper limb, including shoulder: Secondary | ICD-10-CM | POA: Diagnosis not present

## 2021-08-08 DIAGNOSIS — D2261 Melanocytic nevi of right upper limb, including shoulder: Secondary | ICD-10-CM | POA: Diagnosis not present

## 2021-08-08 DIAGNOSIS — L821 Other seborrheic keratosis: Secondary | ICD-10-CM | POA: Diagnosis not present

## 2021-08-08 DIAGNOSIS — L97821 Non-pressure chronic ulcer of other part of left lower leg limited to breakdown of skin: Secondary | ICD-10-CM | POA: Diagnosis not present

## 2021-08-24 ENCOUNTER — Other Ambulatory Visit: Payer: Self-pay

## 2021-08-24 ENCOUNTER — Encounter: Payer: Medicare PPO | Attending: Physician Assistant | Admitting: Physician Assistant

## 2021-08-24 DIAGNOSIS — C44729 Squamous cell carcinoma of skin of left lower limb, including hip: Secondary | ICD-10-CM | POA: Diagnosis not present

## 2021-08-24 DIAGNOSIS — L97822 Non-pressure chronic ulcer of other part of left lower leg with fat layer exposed: Secondary | ICD-10-CM | POA: Diagnosis not present

## 2021-08-24 DIAGNOSIS — I10 Essential (primary) hypertension: Secondary | ICD-10-CM | POA: Diagnosis not present

## 2021-08-24 DIAGNOSIS — I480 Paroxysmal atrial fibrillation: Secondary | ICD-10-CM | POA: Insufficient documentation

## 2021-08-24 DIAGNOSIS — I872 Venous insufficiency (chronic) (peripheral): Secondary | ICD-10-CM | POA: Diagnosis not present

## 2021-08-25 NOTE — Progress Notes (Signed)
JAVORIS, STAR ([id number]) Visit Report for 08/24/2021 Chief Complaint Document Details Patient Name: ADYNN, CASERES. Date of Service: 08/24/2021 8:45 AM Medical Record Number: [id number] Patient Account Number: [id number] Date of Birth/Sex: [birth year] (75 y.o. M) Treating RN: Carlene Coria Primary Care Provider: [name] Other Clinician: Referring Provider: Evorn Gong, DAVID Treating Provider/Extender: Skipper Cliche in Treatment: 0 Information Obtained from: Patient Chief Complaint Left LE Ulcer Electronic Signature(s) Signed: 08/24/2021 9:52:08 AM By: [name] PA-C Entered By: [name] on 08/24/2021 09:52:08 JULEON, NARANG ([id number]) -------------------------------------------------------------------------------- Debridement Details Patient Name: WITTEN, CERTAIN R. Date of Service: 08/24/2021 8:45 AM Medical Record Number: [id number] Patient Account Number: [id number] Date of Birth/Sex: [birth year] (75 y.o. M) Treating RN: Carlene Coria Primary Care Provider: [name] Other Clinician: Referring Provider: Evorn Gong, DAVID Treating Provider/Extender: Skipper Cliche in Treatment: 0 Debridement Performed for Wound #3 Left,Medial Lower Leg Assessment: Performed By: Physician Tommie Sams., PA-C Debridement Type: Debridement Level of Consciousness (Pre- Awake and Alert procedure): Pre-procedure Verification/Time Out Yes - 09:50 Taken: Start Time: 09:50 Pain Control: Lidocaine 4% Topical Solution Total Area Debrided (L x W): 1.5 (cm) x 2.2 (cm) = 3.3 (cm) Tissue and other material Viable, Non-Viable, Slough, Subcutaneous, Skin: Dermis , Skin: Epidermis, Slough debrided: Level: Skin/Subcutaneous Tissue Debridement Description: Excisional Instrument: Curette Bleeding: Moderate Hemostasis Achieved: Pressure End Time: 10:02 Procedural Pain: 0 Post Procedural Pain: 0 Response to Treatment: Procedure was tolerated well Level of Consciousness  (Post- Awake and Alert procedure): Post Debridement Measurements of Total Wound Length: (cm) 1.5 Width: (cm) 2.2 Depth: (cm) 0.2 Volume: (cm) 0.518 Character of Wound/Ulcer Post Debridement: Improved Post Procedure Diagnosis Same as Pre-procedure Electronic Signature(s) Signed: 08/24/2021 5:41:09 PM By: [name] PA-C Signed: 08/25/2021 10:13:49 AM By: Carlene Coria RN Entered By: Carlene Coria on 08/24/2021 10:01:29 [name] ([id number]) -------------------------------------------------------------------------------- HPI Details Patient Name: BARRIE, WALE R. Date of Service: 08/24/2021 8:45 AM Medical Record Number: [id number] Patient Account Number: [id number] Date of Birth/Sex: Mar 06, 1946 (75 y.o. M) Treating RN: Carlene Coria Primary Care Provider: [name] Other Clinician: Referring Provider: Evorn Gong, DAVID Treating Provider/Extender: Skipper Cliche in Treatment: 0 History of Present Illness HPI Description: 75 year old male who has a past medical history of essential hypertension, chronic atrial fibrillation, peripheral vascular disease, nonischemic cardiomyopathy,venous stasis dermatitis, gouty arthropathy, basal cell carcinoma of the right lower extremity, benign prostatic hypertrophy, long-term use of anticoagulation therapy, hyperglycemia and exercise intolerance has never been a smoker. the patient has had a vascular workup over 7 years ago and said everything was normal at that stage. He does not have any chronic problems except for cardiac issues which he sees a cardiologist in [place]. 08/15/2017 -- arterial and venous duplex studies still pending. 08/23/2017 -- venous reflux studies done on 08/13/2017 shows venous incompetence throughout the left lower extremity deep system and focally at the left saphenofemoral junction. 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 No evidence of SVT or DVT in bilateral lower extremities The  patient has an appointment at the end of the month to get his arterial duplex study done 09/05/2017 -- the patient was seen at the vein and vascular office yesterday by Angelena Form. ABI studies were notable for medial calcification and the toe brachial indices were normal and bilateral ankle-brachial) waveforms were normal with triphasic flow. After review of his venous studies he was not a candidate for laser ablation and his lymphedema was to be treated with compression stockings and lymphedema pump pumps 09/12/2017 --  had a low arterial study done at the [facility] -- unable to obtain reliable ABI is due to medial calcification. Bilateral toe indices were normal with the right being 1.01 and the left being 0.92 and the waveforms were triphasic bilaterally. he did get hold of 30-40 mm compression stockings but is unable to put these on. We will try and get him alternative compression stockings. 09/26/17- he is here in follow up evaluation of a right lower extremity ulcer;he is compliant in wearing compression stocking; ulcer almost epithelialized , anticipate healing next appointment Readmission: 11/17 point upon evaluation patient's wound currently that he is seeing [place] for today is a skin cancerous lesion that was cleared away by his dermatologist on the left medial calf region. He tells me that this is a very similar thing to what he had done previously in fact the last time he saw him in 2018 this was also what was going on at that point. Nonetheless he feels that based on what he seeing currently that this is just having a lot of harder time healing although it is much closer to the surface than what he is experienced in the past. He notes that the initial removal was in June 2022 which was this year this is now November and still has not closed. He does have some edema and definitely I think that there is some venous component to his slow healing here. Also think that  we can do something better than Vaseline to try to help with getting this to clear up as quickly as possible. He does have a history of atrial fibrillation and is on Eliquis otherwise he really has no major medical problems that would affect wound healing. Electronic Signature(s) Signed: 08/24/2021 10:28:02 AM By: [name] PA-C Entered By: [name] on 08/24/2021 10:28:02 IANN, RODIER ([id number]) -------------------------------------------------------------------------------- Physical Exam Details Patient Name: AMROM, ORE R. Date of Service: 08/24/2021 8:45 AM Medical Record Number: [id number] Patient Account Number: [id number] Date of Birth/Sex: [birth year] (75 y.o. M) Treating RN: Carlene Coria Primary Care Provider: [name] Other Clinician: Referring Provider: Evorn Gong, DAVID Treating Provider/Extender: Skipper Cliche in Treatment: 0 Constitutional patient is hypertensive.. pulse regular and within target range for patient.Marland Kitchen respirations regular, non-labored and within target range for patient.Marland Kitchen temperature within target range for patient.. Well-nourished and well-hydrated in no acute distress. Eyes conjunctiva clear no eyelid edema noted. pupils equal round and reactive to light and accommodation. Ears, Nose, Mouth, and Throat no gross abnormality of ear auricles or external auditory canals. normal hearing noted during conversation. mucus membranes moist. Respiratory normal breathing without difficulty. Cardiovascular 1+ dorsalis pedis/posterior tibialis pulses. 1+ pitting edema of the bilateral lower extremities. Musculoskeletal normal gait and posture. no significant deformity or arthritic changes, no loss or range of motion, no clubbing. Psychiatric this patient is able to make decisions and demonstrates good insight into disease process. Alert and Oriented x 3. pleasant and cooperative. Notes Upon inspection patient's wound bed actually showed signs  of some slough on the surface of the wound. Fortunately there does not appear to be any evidence of infection currently which is great news I do think cleaning this away with gently with a curette would be of benefit. I discussed that with him today and he is in agreement with proceeding as such. Nonetheless I did perform debridement to clear away the necrotic debris he tolerated that without complication postdebridement the wound bed appears to be doing much better. Electronic Signature(s)  Signed: 08/24/2021 10:28:34 AM By: [name] PA-C Entered By: [name] on 08/24/2021 10:28:33 [name] ([id number]) -------------------------------------------------------------------------------- Physician Orders Details Patient Name: URIJAH, ARKO R. Date of Service: 08/24/2021 8:45 AM Medical Record Number: [id number] Patient Account Number: [id number] Date of Birth/Sex: Feb 12, 1946 (75 y.o. 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 o Other: - next week Bathing/ Shower/ Hygiene o May shower with wound dressing protected with water repellent cover or cast protector. Edema Control - Lymphedema / Segmental Compressive Device / Other o Elevate, Exercise Daily and Avoid Standing for Long Periods of Time. o Elevate legs to the level of the heart and pump ankles as often as possible o Elevate leg(s) parallel to the floor when sitting. Wound Treatment Wound #3 - Lower Leg  Wound Laterality: Left, Medial Cleanser: Wound Cleanser 2 x Per Week Discharge Instructions: Wash your hands with soap and water. Remove old dressing, discard into plastic bag and place into trash. Cleanse the wound with Wound Cleanser prior to applying a clean dressing using gauze sponges, not tissues or cotton balls. Do not scrub or use excessive force. Pat dry using gauze sponges, not tissue or cotton balls. Primary Dressing: Prisma 4.34 (in) 2 x Per Week Discharge Instructions: Moisten w/normal saline or sterile water; Cover wound as directed. Do not remove from wound bed. Secondary Dressing: ABD Pad 5x9 (in/in) 2 x Per Week Discharge Instructions: Cover with ABD pad Compression Wrap: Profore Lite LF 3 Multilayer Compression Bandaging System 2 x Per Week Discharge Instructions: Apply 3 multi-layer wrap as prescribed. Electronic Signature(s) Signed: 08/24/2021 5:41:09 PM By: [name] PA-C Signed: 08/25/2021 10:13:49 AM By: Carlene Coria RN Entered By: Carlene Coria on 08/24/2021 10:03:48 DERRICH, GABY ([id number]) -------------------------------------------------------------------------------- Problem List Details Patient Name: DAVION, FLANNERY R. Date of Service: 08/24/2021 8:45 AM Medical Record Number: [id number] Patient Account Number: [id number] Date of Birth/Sex: [birth year] (75 y.o. 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 Date of Service: 08/24/2021 8:45 AM Medical Record Number: [id number] Patient Account Number: [id number] Date of Birth/Sex: 1946-04-26 (75 y.o. M) Treating RN: Carlene Coria Primary Care Provider: [name] Other Clinician: Referring Provider: Evorn Gong, DAVID Treating Provider/Extender: Skipper Cliche in Treatment: 0 Subjective Chief Complaint Information obtained from Patient Left LE Ulcer History of Present Illness (HPI) 75 year old male who has a past medical history of essential hypertension, chronic atrial fibrillation, peripheral vascular disease, nonischemic cardiomyopathy,venous stasis dermatitis, gouty arthropathy, basal cell carcinoma  of the right lower extremity, benign prostatic hypertrophy, long- term use of anticoagulation therapy, hyperglycemia and exercise intolerance has never been a smoker. the patient has had a vascular workup over 7 years ago and said everything was normal at that stage. He does not have any chronic problems except for cardiac issues which he sees a cardiologist in [place]. 08/15/2017 -- arterial and venous duplex studies still pending. 08/23/2017 -- venous reflux studies done on 08/13/2017 shows venous incompetence throughout the left lower extremity deep system and focally at the left saphenofemoral junction. 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 No evidence of SVT or DVT in bilateral lower extremities The patient has an appointment at the end of the month to get his arterial duplex study done 09/05/2017 -- the patient was seen at the vein and vascular office yesterday by Angelena Form. ABI studies were notable for  medial calcification and the toe brachial indices were normal and bilateral ankle-brachial) waveforms were normal with triphasic flow. After review of his venous studies he was not a candidate for laser ablation and his lymphedema was to be treated with compression stockings and lymphedema pump pumps 09/12/2017 -- had a low arterial study done at the [facility] -- unable to obtain reliable ABI is due to medial calcification. Bilateral toe indices were normal with the right being 1.01 and the left being 0.92 and the waveforms were triphasic bilaterally. he did get hold of 30-40 mm compression stockings but is unable to put these on. We will try and get him alternative compression stockings. 09/26/17- he is here in follow up evaluation of a right lower extremity ulcer;he is compliant in wearing compression stocking; ulcer almost epithelialized , anticipate healing next appointment Readmission: 11/17 point upon evaluation patient's wound currently that he is seeing [place] for today is a skin cancerous lesion that was cleared away by his dermatologist on the left medial calf region. He tells me that this is a very similar thing to what he had done previously in fact the last time he saw him in 2018 this was also what was going on at that point. Nonetheless he feels that based on what he seeing currently that this is just having a lot of harder time healing although it is much closer to the surface than what he is experienced in the past. He notes that the initial removal was in June 2022 which was this year this is now November and still has not closed. He does have some edema and definitely I think that there is some venous component to his slow healing here. Also think that we can do something better than Vaseline to try to help with getting this to clear up as quickly as possible. He does have a history of atrial fibrillation and is on Eliquis otherwise he really has no major  medical problems that would affect wound healing. Patient History Information obtained from Patient. Allergies No Known Allergies Family History Cancer - Father, Diabetes - Paternal Grandparents, Heart Disease - Father, Hypertension - Mother,Father,Siblings, No family history of Hereditary Spherocytosis, Kidney Disease, Lung Disease, Seizures, Stroke, Thyroid Problems, Tuberculosis. Social History Never smoker, Marital Status - Divorced, Alcohol Use - Rarely, Drug Use - No History, Caffeine Use - Never. Medical History Cardiovascular Patient has history of Arrhythmia - a-fib, Hypertension Endocrine Patient has history of Type II Diabetes Musculoskeletal Patient has history of Gout Review of Systems (ROS) Rish, Peters ([id number]) Integumentary (Skin) Complains or has symptoms  of Wounds. Objective Constitutional patient is hypertensive.. pulse regular and within target range for patient.Marland Kitchen respirations regular, non-labored and within target range for patient.Marland Kitchen temperature within target range for patient.. Well-nourished and well-hydrated in no acute distress. Vitals Time Taken: 9:06 AM, Height: 75 in, Source: Stated, Weight: 245 lbs, Source: Measured, BMI: 30.6, Temperature: 98.2 F, Pulse: 71 bpm, Respiratory Rate: 18 breaths/min, Blood Pressure: 156/65 mmHg. Eyes conjunctiva clear no eyelid edema noted. pupils equal round and reactive to light and accommodation. Ears, Nose, Mouth, and Throat no gross abnormality of ear auricles or external auditory canals. normal hearing noted during conversation. mucus membranes moist. Respiratory normal breathing without difficulty. Cardiovascular 1+ dorsalis pedis/posterior tibialis pulses. 1+ pitting edema of the bilateral lower extremities. Musculoskeletal normal gait and posture. no significant deformity or arthritic changes, no loss or range of motion, no clubbing. Psychiatric this patient is able to make decisions and demonstrates  good insight into disease process. Alert and Oriented x 3. pleasant and cooperative. General Notes: Upon inspection patient's wound bed actually showed signs of some slough on the surface of the wound. Fortunately there does not appear to be any evidence of infection currently which is great news I do think cleaning this away with gently with a curette would be of benefit. I discussed that with him today and he is in agreement with proceeding as such. Nonetheless I did perform debridement to clear away the necrotic debris he tolerated that without complication postdebridement the wound bed appears to be doing much better. Integumentary (Hair, Skin) Wound #3 status is Open. Original cause of wound was Surgical Injury. The date acquired was: 04/02/2021. The wound is located on the Left,Medial Lower Leg. The wound measures 1.5cm length x 2.2cm width x 0.2cm depth; 2.592cm^2 area and 0.518cm^3 volume. There is Fat Layer (Subcutaneous Tissue) exposed. There is no tunneling or undermining noted. There is a medium amount of serosanguineous drainage noted. There is medium (34-66%) red, pink granulation within the wound bed. There is a medium (34-66%) amount of necrotic tissue within the wound bed including Adherent Slough. 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 There was a Excisional Skin/Subcutaneous Tissue Debridement with a total area of 3.3 sq cm performed by Tommie Sams., PA-C. With the following instrument(s): Curette to remove Viable and ASHTON, BELOTE. ([id number]) Non-Viable tissue/material. Material removed includes Subcutaneous Tissue, Slough, Skin: Dermis, and Skin: Epidermis  after achieving pain control using Lidocaine 4% Topical Solution. No specimens were taken. A time out was conducted at 09:50, prior to the start of the procedure. A Moderate amount of bleeding was controlled with Pressure. The procedure was tolerated well with a pain level of 0 throughout and a pain level of 0 following the procedure. Post Debridement Measurements: 1.5cm length x 2.2cm width x 0.2cm depth; 0.518cm^3 volume. Character of Wound/Ulcer Post Debridement is improved. Post procedure Diagnosis Wound #3: Same as Pre-Procedure Plan Follow-up Appointments: Return Appointment in 2 weeks. Other: - next week Bathing/ Shower/ Hygiene: May shower with wound dressing protected with water repellent cover or cast protector. Edema Control - Lymphedema / Segmental Compressive Device / Other: Elevate, Exercise Daily and Avoid Standing for Long Periods of Time. Elevate legs to the level of the heart and pump  ankles as often as possible Elevate leg(s) parallel to the floor when sitting. WOUND #3: - Lower Leg Wound Laterality: Left, Medial Cleanser: Wound Cleanser 2 x Per Week/ Discharge Instructions: Wash your hands with soap and water. Remove old dressing, discard into plastic bag and place into trash. Cleanse the wound with Wound Cleanser prior to applying a clean dressing using gauze sponges, not tissues or cotton balls. Do not scrub or use excessive force. Pat dry using gauze sponges, not tissue or cotton balls. Primary Dressing: Prisma 4.34 (in) 2 x Per Week/ Discharge Instructions: Moisten w/normal saline or sterile water; Cover wound as directed. Do not remove from wound bed. Secondary Dressing: ABD Pad 5x9 (in/in) 2 x Per Week/ Discharge Instructions: Cover with ABD pad Compression Wrap: Profore Lite LF 3 Multilayer Compression Bandaging System 2 x Per Week/ Discharge Instructions: Apply 3 multi-layer wrap as prescribed. 1. I would recommend that we go ahead and initiate treatment with a  silver collagen dressing which I think is good to be a good option for him currently. 2. I am also can recommend at this time that we have the patient continue with a compression wrap which she previously did well with. This will be a 3 layer compression wrap. 3. I am also going to recommend he continue to elevate his legs much as possible to help with edema control. We will see patient back for reevaluation in 1 week here in the clinic. If anything worsens or changes patient will contact our office for additional recommendations. Electronic Signature(s) Signed: 08/24/2021 10:29:21 AM By: [name] PA-C Entered By: [name] on 08/24/2021 10:29:21 VIPUL, CAFARELLI ([id number]) -------------------------------------------------------------------------------- ROS/PFSH Details Patient Name: KENYETTA, FIFE R. Date of Service: 08/24/2021 8:45 AM Medical Record Number: [id number] Patient Account Number: [id number] Date of Birth/Sex: 1945/11/13 (75 y.o. M) Treating RN: Carlene Coria Primary Care Provider: [name] Other Clinician: Referring Provider: Evorn Gong, DAVID Treating Provider/Extender: Skipper Cliche in Treatment: 0 Information Obtained From Patient Integumentary (Skin) Complaints and Symptoms: Positive for: Wounds Cardiovascular Medical History: Positive for: Arrhythmia - a-fib; Hypertension Endocrine Medical History: Positive for: Type II Diabetes Treated with: Diet Musculoskeletal Medical History: Positive for: Gout Immunizations Pneumococcal Vaccine: Received Pneumococcal Vaccination: Yes Received Pneumococcal Vaccination On or After 60th Birthday: Yes Implantable Devices No devices added Family and Social History Cancer: Yes - Father; Diabetes: Yes - Paternal Grandparents; Heart Disease: Yes - Father; Hereditary Spherocytosis: No; Hypertension: Yes - Mother,Father,Siblings; Kidney Disease: No; Lung Disease: No; Seizures: No; Stroke: No; Thyroid  Problems: No; Tuberculosis: No; Never smoker; Marital Status - Divorced; Alcohol Use: Rarely; Drug Use: No History; Caffeine Use: Never; Financial Concerns: No; Food, Clothing or Shelter Needs: No; Support System Lacking: No; Transportation Concerns: No Electronic Signature(s) Signed: 08/24/2021 5:41:09 PM By: [name] PA-C Signed: 08/25/2021 10:13:49 AM By: Carlene Coria RN Entered By: Carlene Coria on 08/24/2021 09:10:31 HEBER, HOOG ([id number]) -------------------------------------------------------------------------------- SuperBill Details Patient Name: KWABENA, STRUTZ R. Date of Service: 08/24/2021 Medical Record Number: [id number] Patient Account Number: [id number] Date of Birth/Sex: 19-Dec-1945 (75 y.o. M) Treating RN: Carlene Coria Primary Care Provider: [name] Other Clinician: Referring Provider: Evorn Gong, DAVID Treating Provider/Extender: Skipper Cliche in Treatment: 0 Diagnosis Coding ICD-10 Codes Code Description [phone] Squamous cell carcinoma of skin of left lower limb, including hip L97.822 Non-pressure chronic ulcer of other part of left lower leg with fat layer exposed I48.0 Paroxysmal atrial fibrillation I10 Essential (primary) hypertension Facility Procedures CPT4 Code: 37902409 Description: [place]  TISSUE 20 SQ CM/< Modifier: Quantity: 1 CPT4 Code: Description: ICD-10 Diagnosis Description L97.822 Non-pressure chronic ulcer of other part of left lower leg with fat layer Modifier: exposed Quantity: Physician Procedures CPT4 Code: 5449201 Description: 00712 - WC PHYS SUBQ TISS 20 SQ CM Modifier: Quantity: 1 CPT4 Code: Description: ICD-10 Diagnosis Description L97.822 Non-pressure chronic ulcer of other part of left lower leg with fat layer Modifier: exposed Quantity: Electronic Signature(s) Signed: 08/24/2021 10:29:43 AM By: [name] PA-C Entered By: [name] on 08/24/2021 10:29:43

## 2021-08-25 NOTE — Progress Notes (Signed)
VAMSI, BITER ([id number]) Visit Report for 08/24/2021 Allergy List Details Patient Name: [name], [name] Date of Service: 08/24/2021 8:45 AM Medical Record Number: [id number] Patient Account Number: [id number] Date of Birth/Sex: [birth year] (75 y.o. M) Treating RN: Yevonne Pax Primary Care Argusta Mcgann: [name] Other Clinician: Referring Taja Pentland: Adolphus Birchwood, DAVID Treating Luetta Piazza/Extender: Allen Derry Weeks in Treatment: 0 Allergies Active Allergies No Known Allergies Allergy Notes Electronic Signature(s) Signed: 08/25/2021 10:13:49 AM By: Yevonne Pax RN Entered By: Yevonne Pax on 08/24/2021 09:07:42 [name], [name] -------------------------------------------------------------------------------- Arrival Information Details Patient Name: [name], [name] Date of Service: 08/24/2021 8:45 AM Medical Record Number: [id number] Patient Account Number: [id number] Date of Birth/Sex: [birth year] (75 y.o. M) Treating RN: Yevonne Pax Primary Care Kilea Mccarey: [name] Other Clinician: Referring Clotilde Loth: Adolphus Birchwood, DAVID Treating Quierra Silverio/Extender: Rowan Blase in Treatment: 0 Visit Information Patient Arrived: Ambulatory Arrival Time: 09:06 Accompanied By: self Transfer Assistance: None Patient Identification Verified: Yes Secondary Verification Process Completed: Yes Patient Requires Transmission-Based No Precautions: Patient Has Alerts: Yes Patient Alerts: Patient on Blood Thinner non compressable History Since Last Visit All ordered tests and consults were completed: No Added or deleted any medications: No Any new allergies or adverse reactions: No Had a fall or experienced change in activities of daily living that may affect risk of falls: No Signs or symptoms of abuse/neglect since last visito No Hospitalized since last visit: No Implantable device outside of the clinic excluding cellular tissue based products placed in the center since last  visit: No Has Dressing in Place as Prescribed: Yes Electronic Signature(s) Signed: 08/25/2021 10:13:49 AM By: Yevonne Pax RN Entered By: Yevonne Pax on 08/24/2021 09:32:22 [name] ([id number]) -------------------------------------------------------------------------------- Clinic Level of Care Assessment Details Patient Name: [name], [name] R. Date of Service: 08/24/2021 8:45 AM Medical Record Number: [id number] Patient Account Number: [id number] Date of Birth/Sex: December 23, 1945 (75 y.o. M) Treating RN: Yevonne Pax Primary Care Nashaun Hillmer: [name] Other Clinician: Referring Reola Buckles: Adolphus Birchwood, DAVID Treating Kalden Wanke/Extender: Rowan Blase in Treatment: 0 Clinic Level of Care Assessment Items TOOL 1 Quantity Score X - Use when EandM and Procedure is performed on INITIAL visit 1 0 ASSESSMENTS - Nursing Assessment / Reassessment X - General Physical Exam (combine w/ comprehensive assessment (listed just below) when performed on new 1 20 pt. evals) X- 1 25 Comprehensive Assessment (HX, ROS, Risk Assessments, Wounds Hx, etc.) ASSESSMENTS - Wound and Skin Assessment / Reassessment []  - Dermatologic / Skin Assessment (not related to wound area) 0 ASSESSMENTS - Ostomy and/or Continence Assessment and Care []  - Incontinence Assessment and Management 0 []  - 0 Ostomy Care Assessment and Management (repouching, etc.) PROCESS - Coordination of Care X - Simple Patient / Family Education for ongoing care 1 15 []  - 0 Complex (extensive) Patient / Family Education for ongoing care X- 1 10 Staff obtains Chiropractor, Records, Test Results / Process Orders []  - 0 Staff telephones HHA, Nursing Homes / Clarify orders / etc []  - 0 Routine Transfer to another Facility (non-emergent condition) []  - 0 Routine Hospital Admission (non-emergent condition) X- 1 15 New Admissions / Manufacturing engineer / Ordering NPWT, Apligraf, etc. []  - 0 Emergency Hospital Admission (emergent  condition) PROCESS - Special Needs []  - Pediatric / Minor Patient Management 0 []  - 0 Isolation Patient Management []  - 0 Hearing / Language / Visual special needs []  - 0 Assessment of Community assistance (transportation, [name], etc.) []  - 0 Additional assistance / Altered mentation []  - 0 Support Surface(s) Assessment (bed, cushion, seat,  etc.) INTERVENTIONS - Miscellaneous []  - External ear exam 0 []  - 0 Patient Transfer (multiple staff / Nurse, adult / Similar devices) []  - 0 Simple Staple / Suture removal (25 or less) []  - 0 Complex Staple / Suture removal (26 or more) []  - 0 Hypo/Hyperglycemic Management (do not check if billed separately) X- 1 15 Ankle / Brachial Index (ABI) - do not check if billed separately Has the patient been seen at the hospital within the last three years: Yes Total Score: 100 Level Of Care: New/Established - Level 3 [name], [name] ([id number]) Electronic Signature(s) Signed: 08/25/2021 10:13:49 AM By: Yevonne Pax RN Entered By: Yevonne Pax on 08/24/2021 10:13:39 [name] ([id number]) -------------------------------------------------------------------------------- Encounter Discharge Information Details Patient Name: [name], [name] R. Date of Service: 08/24/2021 8:45 AM Medical Record Number: [id number] Patient Account Number: [id number] Date of Birth/Sex: 12/28/1945 (75 y.o. M) Treating RN: Yevonne Pax Primary Care Merlene Dante: [name] Other Clinician: Referring Camree Wigington: Adolphus Birchwood, DAVID Treating Josefita Weissmann/Extender: Rowan Blase in Treatment: 0 Encounter Discharge Information Items Post Procedure Vitals Discharge Condition: Stable Temperature (F): 98.2 Ambulatory Status: Ambulatory Pulse (bpm): 71 Discharge Destination: Home Respiratory Rate (breaths/min): 18 Transportation: Private Auto Blood Pressure (mmHg): 156/65 Accompanied By: self Schedule Follow-up Appointment: Yes Clinical Summary of Care: Patient  Declined Electronic Signature(s) Signed: 08/25/2021 10:13:49 AM By: Yevonne Pax RN Entered By: Yevonne Pax on 08/24/2021 10:15:08 [name] ([id number]) -------------------------------------------------------------------------------- Lower Extremity Assessment Details Patient Name: [name], [name] R. Date of Service: 08/24/2021 8:45 AM Medical Record Number: [id number] Patient Account Number: [id number] Date of Birth/Sex: [birth year] (75 y.o. M) Treating RN: Yevonne Pax Primary Care Janeya Deyo: [name] Other Clinician: Referring Adiba Fargnoli: Adolphus Birchwood, DAVID Treating Lileigh Fahringer/Extender: Rowan Blase in Treatment: 0 Edema Assessment Assessed: [Left: No] [Right: No] [Left: Edema] [Right: :] Calf Left: Right: Point of Measurement: 40 cm From Medial Instep 41 cm Ankle Left: Right: Point of Measurement: 12 cm From Medial Instep 24 cm Knee To Floor Left: Right: From Medial Instep 50 cm Vascular Assessment Pulses: Dorsalis Pedis Palpable: [Left:Yes Yes] Notes non compressable Electronic Signature(s) Signed: 08/25/2021 10:13:49 AM By: Yevonne Pax RN Entered By: Yevonne Pax on 08/24/2021 09:31:26 [name] ([id number]) -------------------------------------------------------------------------------- Multi Wound Chart Details Patient Name: [name] Knapp R. Date of Service: 08/24/2021 8:45 AM Medical Record Number: [id number] Patient Account Number: [id number] Date of Birth/Sex: 06-17-1946 (75 y.o. M) Treating RN: Yevonne Pax Primary Care Letty Salvi: [name] Other Clinician: Referring Rori Goar: Adolphus Birchwood, DAVID Treating Josian Lanese/Extender: Rowan Blase in Treatment: 0 Vital Signs Height(in): 75 Pulse(bpm): 71 Weight(lbs): 245 Blood Pressure(mmHg): 156/65 Body Mass Index(BMI): 31 Temperature(F): 98.2 Respiratory Rate(breaths/min): 18 Photos: [[name]:[name]] Wound Location: Left, Medial Lower Leg [name] [name] Wounding Event: Surgical Injury [name] [name] Primary  Etiology: Atypical [name] [name] Comorbid History: Arrhythmia, Hypertension, Type II [name] [name] Diabetes, Gout Date Acquired: 04/02/2021 [name] [name] Weeks of Treatment: 0 [name] [name] Wound Status: Open [name] [name] Measurements L x W x D (cm) 1.5x2.2x0.2 [name] [name] Area (cm) : 2.592 [name] [name] Volume (cm) : 0.518 [name] [name] Classification: Full Thickness Without Exposed [name] [name] Support Structures Exudate Amount: Medium [name] [name] Exudate Type: Serosanguineous [name] [name] Exudate Color: red, brown [name] [name] Granulation Amount: Medium (34-66%) [name] [name] Granulation Quality: Red, Pink [name] [name] Necrotic Amount: Medium (34-66%) [name] [name] Exposed Structures: Fat Layer (Subcutaneous Tissue): [name] [name] Yes Fascia: No Tendon: No Muscle: No Joint: No Bone: No Epithelialization: None [name] [name] Treatment Notes Electronic Signature(s) Signed: 08/25/2021 10:13:49 AM By: Yevonne Pax RN Entered By: Jettie Pagan  Carrie on 08/24/2021 09:54:58 [name], [name] ([id number]) -------------------------------------------------------------------------------- Multi-Disciplinary Care Plan Details Patient Name: [name], VOGELER. Date of Service: 08/24/2021 8:45 AM Medical Record Number: [id number] Patient Account Number: [id number] Date of Birth/Sex: 26-Dec-1945 (75 y.o. M) Treating RN: Yevonne Pax Primary Care Tiernan Millikin: [name] Other Clinician: Referring Jakya Dovidio: Adolphus Birchwood, DAVID Treating Lovely Kerins/Extender: Rowan Blase in Treatment: 0 Active Inactive Wound/Skin Impairment Nursing Diagnoses: Knowledge deficit related to ulceration/compromised skin integrity Goals: Patient/caregiver will verbalize understanding of skin care regimen Date Initiated: 08/24/2021 Target Resolution Date: 09/23/2021 Goal Status: Active Ulcer/skin breakdown will have a volume reduction of 30% by week 4 Date Initiated: 08/24/2021 Target Resolution Date: 10/24/2021 Goal Status: Active Ulcer/skin breakdown will have a volume reduction of 50% by week 8 Date  Initiated: 08/24/2021 Target Resolution Date: 11/24/2021 Goal Status: Active Ulcer/skin breakdown will have a volume reduction of 80% by week 12 Date Initiated: 08/24/2021 Target Resolution Date: 12/22/2021 Goal Status: Active Ulcer/skin breakdown will heal within 14 weeks Date Initiated: 08/24/2021 Target Resolution Date: 01/22/2022 Goal Status: Active Interventions: Assess patient/caregiver ability to obtain necessary supplies Assess patient/caregiver ability to perform ulcer/skin care regimen upon admission and as needed Assess ulceration(s) every visit Notes: Electronic Signature(s) Signed: 08/25/2021 10:13:49 AM By: Yevonne Pax RN Entered By: Yevonne Pax on 08/24/2021 09:54:27 [name], [name] ([id number]) -------------------------------------------------------------------------------- Pain Assessment Details Patient Name: [name], [name] R. Date of Service: 08/24/2021 8:45 AM Medical Record Number: [id number] Patient Account Number: [id number] Date of Birth/Sex: Apr 30, 1946 (75 y.o. M) Treating RN: Yevonne Pax Primary Care Deionna Marcantonio: [name] Other Clinician: Referring Felissa Blouch: Adolphus Birchwood, DAVID Treating Esperansa Sarabia/Extender: Rowan Blase in Treatment: 0 Active Problems Location of Pain Severity and Description of Pain Patient Has Paino No Site Locations Pain Management and Medication Current Pain Management: Electronic Signature(s) Signed: 08/25/2021 10:13:49 AM By: Yevonne Pax RN Entered By: Yevonne Pax on 08/24/2021 09:06:44 [name] ([id number]) -------------------------------------------------------------------------------- Patient/Caregiver Education Details Patient Name: [name] StevensonO, RIDENHOUR. Date of Service: 08/24/2021 8:45 AM Medical Record Number: [id number] Patient Account Number: [id number] Date of Birth/Gender: 08/26/1946 (75 y.o. M) Treating RN: Yevonne Pax Primary Care Physician: [name] Other Clinician: Referring Physician: Adolphus Birchwood,  DAVID Treating Physician/Extender: Rowan Blase in Treatment: 0 Education Assessment Education Provided To: Patient Education Topics Provided Wound/Skin Impairment: Methods: Explain/Verbal Responses: State content correctly Electronic Signature(s) Signed: 08/25/2021 10:13:49 AM By: Yevonne Pax RN Entered By: Yevonne Pax on 08/24/2021 10:14:07 [name] ([id number]) -------------------------------------------------------------------------------- Wound Assessment Details Patient Name: MINNIE, CRISANTO R. Date of Service: 08/24/2021 8:45 AM Medical Record Number: [id number] Patient Account Number: [id number] Date of Birth/Sex: June 19, 1946 (75 y.o. M) Treating RN: Yevonne Pax Primary Care Aldena Worm: [name] Other Clinician: Referring Clytee Heinrich: Adolphus Birchwood, DAVID Treating Ladene Allocca/Extender: Rowan Blase in Treatment: 0 Wound Status Wound Number: 3 Primary Etiology: Atypical Wound Location: Left, Medial Lower Leg Wound Status: Open Wounding Event: Surgical Injury Comorbid History: Arrhythmia, Hypertension, Type II Diabetes, Gout Date Acquired: 04/02/2021 Weeks Of Treatment: 0 Clustered Wound: No Photos Wound Measurements Length: (cm) 1.5 Width: (cm) 2.2 Depth: (cm) 0.2 Area: (cm) 2.592 Volume: (cm) 0.518 % Reduction in Area: % Reduction in Volume: Epithelialization: None Tunneling: No Undermining: No Wound Description Classification: Full Thickness Without Exposed Support Structu Exudate Amount: Medium Exudate Type: Serosanguineous Exudate Color: red, brown res Foul Odor After Cleansing: No Wound Bed Granulation Amount: Medium (34-66%) Exposed Structure Granulation Quality: Red, Pink Fascia Exposed: No Necrotic Amount: Medium (34-66%) Fat Layer (Subcutaneous Tissue) Exposed: Yes Necrotic Quality: Adherent Slough Tendon Exposed: No Muscle Exposed: No Joint Exposed:  No Bone Exposed: No Electronic Signature(s) Signed: 08/25/2021 10:13:49 AM By:  Yevonne Pax RN Entered By: Yevonne Pax on 08/24/2021 09:30:23 REDDING, HAY ([id number]) -------------------------------------------------------------------------------- Vitals Details Patient Name: QIAN, AHRENS R. Date of Service: 08/24/2021 8:45 AM Medical Record Number: [id number] Patient Account Number: [id number] Date of Birth/Sex: 01/28/1946 (75 y.o. M) Treating RN: Yevonne Pax Primary Care Dominion Kathan: [name] Other Clinician: Referring Nekisha Mcdiarmid: Adolphus Birchwood, DAVID Treating Lemya Greenwell/Extender: Rowan Blase in Treatment: 0 Vital Signs Time Taken: 09:06 Temperature (F): 98.2 Height (in): 75 Pulse (bpm): 71 Source: Stated Respiratory Rate (breaths/min): 18 Weight (lbs): 245 Blood Pressure (mmHg): 156/65 Source: Measured Reference Range: 80 - 120 mg / dl Body Mass Index (BMI): 30.6 Electronic Signature(s) Signed: 08/25/2021 10:13:49 AM By: Yevonne Pax RN Entered By: Yevonne Pax on 08/24/2021 09:07:20

## 2021-08-25 NOTE — Progress Notes (Signed)
[name], [name] ([id number]) Visit Report for 08/24/2021 Abuse/Suicide Risk Screen Details Patient Name: [name], [name] Date of Service: 08/24/2021 8:45 AM Medical Record Number: [id number] Patient Account Number: [id number] Date of Birth/Sex: 1946/10/08 (75 y.o. M) Treating RN: Carlene Coria Primary Care Xylon Croom: [name] Other Clinician: Referring Shterna Laramee: Evorn Gong, DAVID Treating Vear Staton/Extender: Skipper Cliche in Treatment: 0 Abuse/Suicide Risk Screen Items Answer ABUSE RISK SCREEN: Has anyone close to you tried to hurt or harm you recentlyo No Do you feel uncomfortable with anyone in your familyo No Has anyone forced you do things that you didnot want to doo No Electronic Signature(s) Signed: 08/25/2021 10:13:49 AM By: Carlene Coria RN Entered By: Carlene Coria on 08/24/2021 09:10:37 [name] ([id number]) -------------------------------------------------------------------------------- Activities of Daily Living Details Patient Name: [name], [name] Date of Service: 08/24/2021 8:45 AM Medical Record Number: [id number] Patient Account Number: [id number] Date of Birth/Sex: April 27, 1946 (75 y.o. M) Treating RN: Carlene Coria Primary Care Teagen Bucio: [name] Other Clinician: Referring Sheridan Gettel: Evorn Gong, DAVID Treating Bing Duffey/Extender: Skipper Cliche in Treatment: 0 Activities of Daily Living Items Answer Activities of Daily Living (Please select one for each item) Drive Automobile Completely Able Take Medications Completely Able Use Telephone Completely Able Care for Appearance Completely Able Use Toilet Completely Able Bath / Shower Completely Able Dress Self Completely Able Feed Self Completely Able Walk Completely Able Get In / Out Bed Completely Able Housework Completely Able Prepare Meals Completely [place] for Self Completely Able Electronic Signature(s) Signed: 08/25/2021 10:13:49 AM By: Carlene Coria  RN Entered By: Carlene Coria on 08/24/2021 [place], [place]. ([id number]) -------------------------------------------------------------------------------- Education Screening Details Patient Name: [name], [name] R. Date of Service: 08/24/2021 8:45 AM Medical Record Number: [id number] Patient Account Number: [id number] Date of Birth/Sex: 10-Sep-1946 (75 y.o. M) Treating RN: Carlene Coria Primary Care Joyice Magda: [name] Other Clinician: Referring Shallen Luedke: Evorn Gong, DAVID Treating Danika Kluender/Extender: Skipper Cliche in Treatment: 0 Primary Learner Assessed: Patient Learning Preferences/Education Level/Primary Language Learning Preference: Explanation Highest Education Level: College or Above Preferred Language: English Cognitive Barrier Language Barrier: No Translator Needed: No Memory Deficit: No Emotional Barrier: No Cultural/Religious Beliefs Affecting Medical Care: No Physical Barrier Impaired Vision: No Impaired Hearing: No Decreased Hand dexterity: No Knowledge/Comprehension Knowledge Level: Medium Comprehension Level: High Ability to understand written instructions: High Ability to understand verbal instructions: High Motivation Anxiety Level: Anxious Cooperation: Cooperative Education Importance: Acknowledges Need Interest in Health Problems: Asks Questions Perception: Coherent Willingness to Engage in Self-Management High Activities: Readiness to Engage in Self-Management High Activities: Electronic Signature(s) Signed: 08/25/2021 10:13:49 AM By: Carlene Coria RN Entered By: Carlene Coria on 08/24/2021 09:12:05 [name] ([id number]) -------------------------------------------------------------------------------- Fall Risk Assessment Details Patient Name: [name] Date of Service: 08/24/2021 8:45 AM Medical Record Number: [id number] Patient Account Number: [id number] Date of Birth/Sex: 03/10/1946 (75 y.o. M) Treating RN: Carlene Coria Primary Care Pavielle Biggar: [name] Other Clinician: Referring Krystyna Cleckley: Evorn Gong, DAVID Treating Raelin Pixler/Extender: Skipper Cliche in Treatment: 0 Fall Risk Assessment Items Have you had 2 or more falls in the last 12 monthso 0 No Have you had any fall that resulted in injury in the last 12 monthso 0 No FALLS RISK SCREEN History of falling - immediate or within 3 months 0 No Secondary diagnosis (Do you have 2 or more medical diagnoseso) 0 No Ambulatory aid None/bed rest/wheelchair/nurse 0 No Crutches/cane/walker 0 No Furniture 0 No Intravenous therapy Access/Saline/Heparin Lock 0 No Gait/Transferring Normal/ bed rest/ wheelchair 0 No Weak (short steps with  or without shuffle, stooped but able to lift head while walking, may 0 No seek support from furniture) Impaired (short steps with shuffle, may have difficulty arising from chair, head down, impaired 0 No balance) Mental Status Oriented to own ability 0 No Electronic Signature(s) Signed: 08/25/2021 10:13:49 AM By: Carlene Coria RN Entered By: Carlene Coria on 08/24/2021 09:12:25 [name] ([id number]) -------------------------------------------------------------------------------- Foot Assessment Details Patient Name: [name], [name] R. Date of Service: 08/24/2021 8:45 AM Medical Record Number: [id number] Patient Account Number: [id number] Date of Birth/Sex: 28-Feb-1946 (75 y.o. M) Treating RN: Carlene Coria Primary Care Maddyson Keil: [name] Other Clinician: Referring Kahlea Cobert: Evorn Gong, DAVID Treating Gaytha Raybourn/Extender: Skipper Cliche in Treatment: 0 Foot Assessment Items Site Locations + = Sensation present, - = Sensation absent, C = Callus, U = Ulcer R = Redness, W = Warmth, M = Maceration, PU = Pre-ulcerative lesion F = Fissure, S = Swelling, D = Dryness Assessment Right: Left: Other Deformity: No No Prior Foot Ulcer: No No Prior Amputation: No No Charcot Joint: No No Ambulatory Status:  Ambulatory Without Help Gait: Steady Electronic Signature(s) Signed: 08/25/2021 10:13:49 AM By: Carlene Coria RN Entered By: Carlene Coria on 08/24/2021 [place], [place]. ([id number]) -------------------------------------------------------------------------------- Nutrition Risk Screening Details Patient Name: [name], [name] R. Date of Service: 08/24/2021 8:45 AM Medical Record Number: [id number] Patient Account Number: [id number] Date of Birth/Sex: 1946/03/30 (75 y.o. M) Treating RN: Carlene Coria Primary Care Tyann Niehaus: [name] Other Clinician: Referring Daila Elbert: Evorn Gong, DAVID Treating Felicie Kocher/Extender: Skipper Cliche in Treatment: 0 Height (in): 75 Weight (lbs): 245 Body Mass Index (BMI): 30.6 Nutrition Risk Screening Items Score Screening NUTRITION RISK SCREEN: I have an illness or condition that made me change the kind and/or amount of food I eat 0 No I eat fewer than two meals per day 0 No I eat few fruits and vegetables, or milk products 0 No I have three or more drinks of beer, liquor or wine almost every day 0 No I have tooth or mouth problems that make it hard for me to eat 0 No I don't always have enough money to buy the food I need 0 No I eat alone most of the time 0 No I take three or more different prescribed or over-the-counter drugs a day 1 Yes Without wanting to, I have lost or gained 10 pounds in the last six months 0 No I am not always physically able to shop, cook and/or feed myself 0 No Nutrition Protocols Good Risk Protocol 0 No interventions needed Moderate Risk Protocol High Risk Proctocol Risk Level: Good Risk Score: 1 Electronic Signature(s) Signed: 08/25/2021 10:13:49 AM By: Carlene Coria RN Entered By: Carlene Coria on 08/24/2021 09:12:47

## 2021-08-29 ENCOUNTER — Other Ambulatory Visit: Payer: Self-pay

## 2021-08-29 DIAGNOSIS — C44729 Squamous cell carcinoma of skin of left lower limb, including hip: Secondary | ICD-10-CM | POA: Diagnosis not present

## 2021-08-29 DIAGNOSIS — L97822 Non-pressure chronic ulcer of other part of left lower leg with fat layer exposed: Secondary | ICD-10-CM | POA: Diagnosis not present

## 2021-08-29 DIAGNOSIS — I480 Paroxysmal atrial fibrillation: Secondary | ICD-10-CM | POA: Diagnosis not present

## 2021-08-29 DIAGNOSIS — I10 Essential (primary) hypertension: Secondary | ICD-10-CM | POA: Diagnosis not present

## 2021-08-29 NOTE — Progress Notes (Signed)
ZEPHAN, DROBNICK ([id number]) Visit Report for 08/29/2021 Arrival Information Details Patient Name: [name], [name] Date of Service: 08/29/2021 3:45 PM Medical Record Number: [id number] Patient Account Number: [id number] Date of Birth/Sex: 1946-04-14 (75 y.o. M) Treating RN: Yevonne Pax Primary Care Oanh Devivo: [name] Other Clinician: Referring Conan Mcmanaway: [name] Treating Maxamillian Tienda/Extender: Rowan Blase in Treatment: 0 Visit Information History Since Last Visit All ordered tests and consults were completed: No Patient Arrived: Ambulatory Added or deleted any medications: No Arrival Time: 13:50 Any new allergies or adverse reactions: No Accompanied By: self Had a fall or experienced change in No Transfer Assistance: None activities of daily living that may affect Patient Identification Verified: Yes risk of falls: Secondary Verification Process Completed: Yes Signs or symptoms of abuse/neglect since last visito No Patient Requires Transmission-Based No Hospitalized since last visit: No Precautions: Implantable device outside of the clinic excluding No Patient Has Alerts: Yes cellular tissue based products placed in the center Patient Alerts: Patient on Blood since last visit: Thinner Has Dressing in Place as Prescribed: Yes non compressable Pain Present Now: No Electronic Signature(s) Signed: 08/29/2021 4:13:41 PM By: Yevonne Pax RN Entered By: Yevonne Pax on 08/29/2021 16:13:41 [name], [name] ([id number]) -------------------------------------------------------------------------------- Clinic Level of Care Assessment Details Patient Name: [name], [name] Date of Service: 08/29/2021 3:45 PM Medical Record Number: [id number] Patient Account Number: [id number] Date of Birth/Sex: Aug 17, 1946 (75 y.o. M) Treating RN: Yevonne Pax Primary Care Akili Corsetti: [name] Other Clinician: Referring Leida Luton: [name] Treating Jett Fukuda/Extender:  Rowan Blase in Treatment: 0 Clinic Level of Care Assessment Items TOOL 1 Quantity Score []  - Use when EandM and Procedure is performed on INITIAL visit 0 ASSESSMENTS - Nursing Assessment / Reassessment []  - General Physical Exam (combine w/ comprehensive assessment (listed just below) when performed on new 0 pt. evals) []  - 0 Comprehensive Assessment (HX, ROS, Risk Assessments, Wounds Hx, etc.) ASSESSMENTS - Wound and Skin Assessment / Reassessment []  - Dermatologic / Skin Assessment (not related to wound area) 0 ASSESSMENTS - Ostomy and/or Continence Assessment and Care []  - Incontinence Assessment and Management 0 []  - 0 Ostomy Care Assessment and Management (repouching, etc.) PROCESS - Coordination of Care []  - Simple Patient / Family Education for ongoing care 0 []  - 0 Complex (extensive) Patient / Family Education for ongoing care []  - 0 Staff obtains Chiropractor, Records, Test Results / Process Orders []  - 0 Staff telephones HHA, Nursing Homes / Clarify orders / etc []  - 0 Routine Transfer to another Facility (non-emergent condition) []  - 0 Routine Hospital Admission (non-emergent condition) []  - 0 New Admissions / Manufacturing engineer / Ordering NPWT, Apligraf, etc. []  - 0 Emergency Hospital Admission (emergent condition) PROCESS - Special Needs []  - Pediatric / Minor Patient Management 0 []  - 0 Isolation Patient Management []  - 0 Hearing / Language / Visual special needs []  - 0 Assessment of Community assistance (transportation, [name], etc.) []  - 0 Additional assistance / Altered mentation []  - 0 Support Surface(s) Assessment (bed, cushion, seat, etc.) INTERVENTIONS - Miscellaneous []  - External ear exam 0 []  - 0 Patient Transfer (multiple staff / Nurse, adult / Similar devices) []  - 0 Simple Staple / Suture removal (25 or less) []  - 0 Complex Staple / Suture removal (26 or more) []  - 0 Hypo/Hyperglycemic Management (do not check if billed  separately) []  - 0 Ankle / Brachial Index (ABI) - do not check if billed separately Has the patient been seen at the hospital within the  last three years: Yes Total Score: 0 Level Of Care: ____ [name] ([id number]) Electronic Signature(s) Unsigned Entered By: Yevonne Pax on 08/29/2021 16:15:31 Signature(s): Date(s): [name], [name] ([id number]) -------------------------------------------------------------------------------- Compression Therapy Details Patient Name: [name], [name] Date of Service: 08/29/2021 3:45 PM Medical Record Number: [id number] Patient Account Number: [id number] Date of Birth/Sex: 1946-04-20 (75 y.o. M) Treating RN: Yevonne Pax Primary Care Everet Flagg: [name] Other Clinician: Referring Saksham Akkerman: [name] Treating Neftali Thurow/Extender: Rowan Blase in Treatment: 0 Compression Therapy Performed for Wound Assessment: Wound #3 Left,Medial Lower Leg Performed By: Clinician Yevonne Pax, RN Compression Type: Three Layer Electronic Signature(s) Signed: 08/29/2021 4:14:30 PM By: Yevonne Pax RN Entered By: Yevonne Pax on 08/29/2021 16:14:30 [name] -------------------------------------------------------------------------------- Encounter Discharge Information Details Patient Name: [name], [name] R. Date of Service: 08/29/2021 3:45 PM Medical Record Number: [id number] Patient Account Number: [id number] Date of Birth/Sex: 1946-02-01 (75 y.o. M) Treating RN: Yevonne Pax Primary Care Iyanni Hepp: [name] Other Clinician: Referring Curley Hogen: [name] Treating Marilynn Ekstein/Extender: Rowan Blase in Treatment: 0 Encounter Discharge Information Items Discharge Condition: Stable Ambulatory Status: Ambulatory Discharge Destination: Home Transportation: Private Auto Accompanied By: self Schedule Follow-up Appointment: Yes Clinical Summary of Care: Patient Declined Electronic Signature(s) Signed:  08/29/2021 4:15:15 PM By: Yevonne Pax RN Entered By: Yevonne Pax on 08/29/2021 16:15:15 [name] ([id number]) -------------------------------------------------------------------------------- Wound Assessment Details Patient Name: [name], [name] R. Date of Service: 08/29/2021 3:45 PM Medical Record Number: [id number] Patient Account Number: [id number] Date of Birth/Sex: 1946-03-28 (75 y.o. M) Treating RN: Yevonne Pax Primary Care Nyana Haren: [name] Other Clinician: Referring Daymein Nunnery: [name] Treating Sheriff Rodenberg/Extender: Rowan Blase in Treatment: 0 Wound Status Wound Number: 3 Primary Etiology: Open Surgical Wound Wound Location: Left, Medial Lower Leg Wound Status: Open Wounding Event: Surgical Injury Comorbid History: Arrhythmia, Hypertension, Type II Diabetes, Gout Date Acquired: 04/02/2021 Weeks Of Treatment: 0 Clustered Wound: No Wound Measurements Length: (cm) 1.5 Width: (cm) 2.2 Depth: (cm) 0.2 Area: (cm) 2.592 Volume: (cm) 0.518 % Reduction in Area: 0% % Reduction in Volume: 0% Epithelialization: None Tunneling: No Undermining: No Wound Description Classification: Full Thickness Without Exposed Support Structures Exudate Amount: Medium Exudate Type: Serosanguineous Exudate Color: red, brown Foul Odor After Cleansing: No Wound Bed Granulation Amount: Medium (34-66%) Exposed Structure Granulation Quality: Red, Pink Fascia Exposed: No Necrotic Amount: Medium (34-66%) Fat Layer (Subcutaneous Tissue) Exposed: Yes Necrotic Quality: Adherent Slough Tendon Exposed: No Muscle Exposed: No Joint Exposed: No Bone Exposed: No Treatment Notes Wound #3 (Lower Leg) Wound Laterality: Left, Medial Cleanser Wound Cleanser Discharge Instruction: Wash your hands with soap and water. Remove old dressing, discard into plastic bag and place into trash. Cleanse the wound with Wound Cleanser prior to applying a clean dressing using gauze sponges, not  tissues or cotton balls. Do not scrub or use excessive force. Pat dry using gauze sponges, not tissue or cotton balls. Peri-Wound Care Topical Primary Dressing Prisma 4.34 (in) Discharge Instruction: Moisten w/normal saline or sterile water; Cover wound as directed. Do not remove from wound bed. Secondary Dressing ABD Pad 5x9 (in/in) Discharge Instruction: Cover with ABD pad Secured With Compression Wrap Profore Lite LF 3 Multilayer Compression [address] [name], [name] ([id number]) Discharge Instruction: Apply 3 multi-layer wrap as prescribed. Compression Stockings Add-Ons Electronic Signature(s) Signed: 08/29/2021 4:14:09 PM By: Yevonne Pax RN Entered By: Yevonne Pax on 08/29/2021 16:14:09

## 2021-09-07 ENCOUNTER — Other Ambulatory Visit: Payer: Self-pay

## 2021-09-07 ENCOUNTER — Encounter: Payer: Medicare PPO | Attending: Physician Assistant | Admitting: Physician Assistant

## 2021-09-07 DIAGNOSIS — Y838 Other surgical procedures as the cause of abnormal reaction of the patient, or of later complication, without mention of misadventure at the time of the procedure: Secondary | ICD-10-CM | POA: Insufficient documentation

## 2021-09-07 DIAGNOSIS — T8189XA Other complications of procedures, not elsewhere classified, initial encounter: Secondary | ICD-10-CM | POA: Insufficient documentation

## 2021-09-07 DIAGNOSIS — Z85828 Personal history of other malignant neoplasm of skin: Secondary | ICD-10-CM | POA: Diagnosis not present

## 2021-09-07 DIAGNOSIS — L97822 Non-pressure chronic ulcer of other part of left lower leg with fat layer exposed: Secondary | ICD-10-CM | POA: Diagnosis not present

## 2021-09-07 DIAGNOSIS — I10 Essential (primary) hypertension: Secondary | ICD-10-CM | POA: Insufficient documentation

## 2021-09-07 DIAGNOSIS — I480 Paroxysmal atrial fibrillation: Secondary | ICD-10-CM | POA: Diagnosis not present

## 2021-09-07 NOTE — Progress Notes (Addendum)
[name], [name] ([id number]) Visit Report for 09/07/2021 Chief Complaint Document Details Patient Name: [name], [name] Date of Service: 09/07/2021 9:15 AM Medical Record Number: [id number] Patient Account Number: [id number] Date of Birth/Sex: 1946-07-18 (75 y.o. M) Treating RN: Donnamarie Poag Primary Care Provider: [name] Other Clinician: Referring Provider: [name] Treating Provider/Extender: Skipper Cliche in Treatment: 2 Information Obtained from: Patient Chief Complaint Left LE Ulcer Electronic Signature(s) Signed: 09/07/2021 9:48:29 AM By: [name] PA-C Entered By: [name] on 09/07/2021 09:48:29 [name], [name] ([id number]) -------------------------------------------------------------------------------- HPI Details Patient Name: [name], [name] Date of Service: 09/07/2021 9:15 AM Medical Record Number: [id number] Patient Account Number: [id number] Date of Birth/Sex: [birth year] (75 y.o. M) Treating RN: Donnamarie Poag Primary Care Provider: [name] Other Clinician: Referring Provider: [name] Treating Provider/Extender: Skipper Cliche in Treatment: 2 History of Present Illness HPI Description: 75 year old male who has a past medical history of essential hypertension, chronic atrial fibrillation, peripheral vascular disease, nonischemic cardiomyopathy,venous stasis dermatitis, gouty arthropathy, basal cell carcinoma of the right lower extremity, benign prostatic hypertrophy, long-term use of anticoagulation therapy, hyperglycemia and exercise intolerance has never been a smoker. the patient has had a vascular workup over 7 years ago and said everything was normal at that stage. He does not have any chronic problems except for cardiac issues which he sees a cardiologist in [place]. 08/15/2017 -- arterial and venous duplex studies still pending. 08/23/2017 -- venous reflux studies done on 08/13/2017 shows venous incompetence  throughout the left lower extremity deep system and focally at the left saphenofemoral junction. 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 No evidence of SVT or DVT in bilateral lower extremities The patient has an appointment at the end of the month to get his arterial duplex study done 09/05/2017 -- the patient was seen at the vein and vascular office yesterday by Angelena Form. ABI studies were notable for medial calcification and the toe brachial indices were normal and bilateral ankle-brachial) waveforms were normal with triphasic flow. After review of his venous studies he was not a candidate for laser ablation and his lymphedema was to be treated with compression stockings and lymphedema pump pumps 09/12/2017 -- had a low arterial study done at the [facility] -- unable to obtain reliable ABI is due to medial calcification. Bilateral toe indices were normal with the right being 1.01 and the left being 0.92 and the waveforms were triphasic bilaterally. he did get hold of 30-40 mm compression stockings but is unable to put these on. We will try and get him alternative compression stockings. 09/26/17- he is here in follow up evaluation of a right lower extremity ulcer;he is compliant in wearing compression stocking; ulcer almost epithelialized , anticipate healing next appointment Readmission: 11/17 point upon evaluation patient's wound currently that he is seeing [place] for today is a skin cancerous lesion that was cleared away by his dermatologist on the left medial calf region. He tells me that this is a very similar thing to what he had done previously in fact the last time he saw him in 2018 this was also what was going on at that point. Nonetheless he feels that based on what he seeing currently that this is just having a lot of harder time healing although it is much closer to the surface than what he is experienced in the past. He notes that the  initial removal was in June 2022 which was this year this is now  November and still has not closed. He does have some edema and definitely I think that there is some venous component to his slow healing here. Also think that we can do something better than Vaseline to try to help with getting this to clear up as quickly as possible. He does have a history of atrial fibrillation and is on Eliquis otherwise he really has no major medical problems that would affect wound healing. 09/07/2021 upon evaluation today patient actually appears to be doing significantly better after having wrapped him last week. Overall I think that this is making significant improvements at this time which is great news. I do not see any evidence of infection which is great news as well. No fevers, chills, nausea, vomiting, or diarrhea. Electronic Signature(s) Signed: 09/07/2021 5:12:17 PM By: [name] PA-C Entered By: [name] on 09/07/2021 17:12:17 [name], [name] ([id number]) -------------------------------------------------------------------------------- Physical Exam Details Patient Name: [name], [name] R. Date of Service: 09/07/2021 9:15 AM Medical Record Number: [id number] Patient Account Number: [id number] Date of Birth/Sex: 20-Oct-1945 (75 y.o. M) Treating RN: Donnamarie Poag Primary Care Provider: [name] Other Clinician: Referring Provider: [name] Treating Provider/Extender: Skipper Cliche in Treatment: 2 Constitutional Well-nourished and well-hydrated in no acute distress. Respiratory normal breathing without difficulty. Psychiatric this patient is able to make decisions and demonstrates good insight into disease process. Alert and Oriented x 3. pleasant and cooperative. Notes Upon inspection patient's wound bed showed signs of good granulation and epithelization at this point. There was a little bit of slough noted on the surface of the wound I was able to clear this away with  saline and gauze no sharp debridement necessary. Electronic Signature(s) Signed: 09/07/2021 5:12:34 PM By: [name] PA-C Entered By: [name] on 09/07/2021 17:12:34 [name], [name] ([id number]) -------------------------------------------------------------------------------- Physician Orders Details Patient Name: [name], [name] Date of Service: 09/07/2021 9:15 AM Medical Record Number: [id number] Patient Account Number: [id number] Date of Birth/Sex: [birth year] (75 y.o. 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 o Nurse Visit as needed Bathing/ Shower/ Hygiene o May shower with wound dressing protected with water repellent cover or cast protector. o No tub bath. Anesthetic (Use 'Patient Medications' Section for Anesthetic Order Entry) o Lidocaine applied to wound bed Edema Control - Lymphedema / Segmental Compressive Device / Other o Elevate, Exercise Daily and Avoid Standing for Long Periods of Time. o Elevate legs to the level of the heart and pump ankles as often as possible o Elevate leg(s) parallel to the floor when sitting. Additional Orders / Instructions o Follow Nutritious Diet and Increase Protein Intake Wound Treatment Wound #3 - Lower Leg Wound Laterality: Left, Medial Cleanser: Soap and Water 1 x Per Week/15 Days Discharge Instructions: Gently cleanse wound with antibacterial soap, rinse and pat dry prior to dressing wounds Cleanser:  Wound Cleanser 1 x Per Week/15 Days Discharge Instructions: Wash your hands with soap and water. Remove old dressing, discard into plastic bag and place into trash. Cleanse the wound with Wound Cleanser prior to applying a clean dressing using gauze sponges, not tissues  or cotton balls. Do not scrub or use excessive force. Pat dry using gauze sponges, not tissue or cotton balls. Topical: Triamcinolone Acetonide Cream, 0.1%, 15 (g) tube 1 x Per Week/15 Days Discharge Instructions: PERI-WOUND to leg Apply as directed by provider. Topical: Theraderm Moisture Lotion 1 x Per Week/15 Days Discharge Instructions: apply to dry skin of leg Primary Dressing: Prisma 4.34 (in) 1 x Per Week/15 Days Discharge Instructions: Moisten w/normal saline or sterile water; Cover wound as directed. Do not remove from wound bed. Secondary Dressing: ABD Pad 5x9 (in/in) 1 x Per Week/15 Days Discharge Instructions: Cover with ABD pad Compression Wrap: Profore Lite LF 3 Multilayer Compression Bandaging System 1 x Per Week/15 Days Discharge Instructions: Apply 3 multi-layer wrap as prescribed. [name], COVELLI ([id number]) Electronic Signature(s) Signed: 09/07/2021 3:46:30 PM By: Donnamarie Poag Signed: 09/07/2021 5:34:12 PM By: [name] PA-C Entered By: Donnamarie Poag on 09/07/2021 10:21:19 [name], [name] ([id number]) -------------------------------------------------------------------------------- Problem List Details Patient Name: [name], [name] Date of Service: 09/07/2021 9:15 AM Medical Record Number: [id number] Patient Account Number: [id number] Date of Birth/Sex: [birth year] (75 y.o. M) Treating RN: Donnamarie Poag Primary Care Provider: [name] Other Clinician: Referring Provider: [name] Treating Provider/Extender: Skipper Cliche in Treatment: 2 Active Problems ICD-10 Encounter Code Description Active Date MDM Diagnosis C44.729 Squamous cell carcinoma of skin of left lower limb, including  hip 08/24/2021 No Yes L97.822 Non-pressure chronic ulcer of other part of left lower leg with fat layer 08/24/2021 No Yes exposed I48.0 Paroxysmal atrial fibrillation 08/24/2021 No Yes I10 Essential (primary) hypertension 08/24/2021 No Yes Inactive Problems Resolved Problems Electronic Signature(s) Signed: 09/07/2021 9:48:17 AM By: [name] PA-C Entered By: [name] on 09/07/2021 09:48:16 Darden, Marlou Starks ([id number]) -------------------------------------------------------------------------------- Progress Note Details Patient Name: [name] Nevin R. Date of Service: 09/07/2021 9:15 AM Medical Record Number: [id number] Patient Account Number: [id number] Date of Birth/Sex: 09/23/1946 (75 y.o. M) Treating RN: Donnamarie Poag Primary Care Provider: [name] Other Clinician: Referring Provider: [name] Treating Provider/Extender: Skipper Cliche in Treatment: 2 Subjective Chief Complaint Information obtained from Patient Left LE Ulcer History of Present Illness (HPI) 75 year old male who has a past medical history of essential hypertension, chronic atrial fibrillation, peripheral vascular disease, nonischemic cardiomyopathy,venous stasis dermatitis, gouty arthropathy, basal cell carcinoma of the right lower extremity, benign prostatic hypertrophy, long- term use of anticoagulation therapy, hyperglycemia and exercise intolerance has never been a smoker. the patient has had a vascular workup over 7 years ago and said everything was normal at that stage. He does not have any chronic problems except for cardiac issues which he sees a cardiologist in [place]. 08/15/2017 -- arterial and venous duplex studies still pending. 08/23/2017 -- venous reflux studies done on 08/13/2017 shows venous incompetence throughout the left lower extremity deep system and focally at the left saphenofemoral junction. 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 No  evidence of SVT or DVT in bilateral lower extremities The patient has an appointment at the end of the month to get his arterial duplex study done 09/05/2017 -- the patient was seen at the vein and vascular office yesterday by Angelena Form. ABI studies were notable for medial calcification and the toe brachial indices were normal and bilateral ankle-brachial) waveforms were normal with triphasic flow. After review of his venous studies he was not a candidate for laser ablation and his lymphedema was to be treated with compression stockings and lymphedema pump pumps 09/12/2017 -- had a low arterial study  done at the [facility] -- unable to obtain reliable ABI is due to medial calcification. Bilateral toe indices were normal with the right being 1.01 and the left being 0.92 and the waveforms were triphasic bilaterally. he did get hold of 30-40 mm compression stockings but is unable to put these on. We will try and get him alternative compression stockings. 09/26/17- he is here in follow up evaluation of a right lower extremity ulcer;he is compliant in wearing compression stocking; ulcer almost epithelialized , anticipate healing next appointment Readmission: 11/17 point upon evaluation patient's wound currently that he is seeing [place] for today is a skin cancerous lesion that was cleared away by his dermatologist on the left medial calf region. He tells me that this is a very similar thing to what he had done previously in fact the last time he saw him in 2018 this was also what was going on at that point. Nonetheless he feels that based on what he seeing currently that this is just having a lot of harder time healing although it is much closer to the surface than what he is experienced in the past. He notes that the initial removal was in June 2022 which was this year this is now November and still has not closed. He does have some edema and definitely I think that there is  some venous component to his slow healing here. Also think that we can do something better than Vaseline to try to help with getting this to clear up as quickly as possible. He does have a history of atrial fibrillation and is on Eliquis otherwise he really has no major medical problems that would affect wound healing. 09/07/2021 upon evaluation today patient actually appears to be doing significantly better after having wrapped him last week. Overall I think that this is making significant improvements at this time which is great news. I do not see any evidence of infection which is great news as well. No fevers, chills, nausea, vomiting, or diarrhea. Objective Constitutional Well-nourished and well-hydrated in no acute distress. Vitals Time Taken: 9:27 AM, Height: 75 in, Weight: 245 lbs, BMI: 30.6, Temperature: 98.3 F, Pulse: 73 bpm, Respiratory Rate: 16 breaths/min, Blood Pressure: 169/95 mmHg. Respiratory [name], [name] R. ([id number]) normal breathing without difficulty. Psychiatric this patient is able to make decisions and demonstrates good insight into disease process. Alert and Oriented x 3. pleasant and cooperative. General Notes: Upon inspection patient's wound bed showed signs of good granulation and epithelization at this point. There was a little bit of slough noted on the surface of the wound I was able to clear this away with saline and gauze no sharp debridement necessary. Integumentary (Hair, Skin) Wound #3 status is Open. Original cause of wound was Surgical Injury. The date acquired was: 04/02/2021. The wound has been in treatment 2 weeks. The wound is located on the Left,Medial Lower Leg. The wound measures 1.5cm length x 1.7cm width x 0.1cm depth; 2.003cm^2 area and 0.2cm^3 volume. There is Fat Layer (Subcutaneous Tissue) exposed. There is no tunneling or undermining noted. There is a medium amount of serosanguineous drainage noted. There is large (67-100%) red, pink, hyper  - granulation within the wound bed. There is a small (1-33%) amount of necrotic tissue within the wound bed including Adherent Slough. 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 There was a Three Layer Compression Therapy Procedure by Donnamarie Poag, RN. Post procedure Diagnosis Wound #3: Same as Pre-Procedure Plan Follow-up Appointments: Return Appointment in 1 week. Nurse Visit as needed Bathing/ Shower/ Hygiene: May shower with wound dressing protected with water repellent cover or cast protector. No tub bath. Anesthetic (Use 'Patient Medications' Section for Anesthetic Order Entry): Lidocaine applied to wound bed Edema Control - Lymphedema / Segmental Compressive Device / Other: Elevate, Exercise Daily and Avoid Standing for Long Periods of Time. Elevate legs to the level of the heart and pump ankles as often as possible Elevate leg(s) parallel to the floor when sitting. Additional Orders / Instructions: Follow Nutritious Diet and Increase Protein Intake WOUND #3: - Lower Leg Wound Laterality: Left, Medial Cleanser: Soap and Water 1 x Per Week/15 Days Discharge Instructions: Gently cleanse wound with antibacterial soap, rinse and pat dry prior to dressing wounds Cleanser: Wound Cleanser 1 x Per Week/15 Days Discharge Instructions: Wash your hands with soap and water. Remove old dressing, discard into plastic bag and place into trash. Cleanse the wound with Wound Cleanser prior to applying a clean dressing using gauze sponges, not tissues or cotton balls. Do not scrub or use excessive force. Pat dry using gauze sponges, not tissue or cotton balls. Topical:  Triamcinolone Acetonide Cream, 0.1%, 15 (g) tube 1 x Per Week/15 Days Discharge Instructions: PERI-WOUND to leg Apply as directed by provider. Topical: Theraderm Moisture Lotion 1 x Per Week/15 Days Discharge Instructions: apply to dry skin of leg Primary Dressing: Prisma 4.34 (in) 1 x Per Week/15 Days Discharge Instructions: Moisten w/normal saline or sterile water; Cover wound as directed. Do not remove from wound bed. CAVAN, BEARDEN ([id number]) Secondary Dressing: ABD Pad 5x9 (in/in) 1 x Per Week/15 Days Discharge Instructions: Cover with ABD pad Compression Wrap: Profore Lite LF 3 Multilayer Compression Bandaging System 1 x Per Week/15 Days Discharge Instructions: Apply 3 multi-layer wrap as prescribed. 1. Would recommend currently that we continue with the silver collagen which I think is doing well. We are seeing some additional granulation which is good news. 2. I am also can recommend that we have the patient continue with the ABD pad to cover which I think is doing good. 3. I am also can recommend that we continue with the 3 layer compression wrap which I think is doing a good job. We will see patient back for reevaluation in 1 week here in the clinic. If anything worsens or changes patient will contact our office for additional recommendations. Electronic Signature(s) Signed: 09/07/2021 5:13:20 PM By: [name] PA-C Entered By: [name] on 09/07/2021 17:13:19 Elkhatib, [place] ([id number]) -------------------------------------------------------------------------------- SuperBill Details Patient Name: WONG, STEADHAM R. Date of Service: 09/07/2021 Medical Record Number: [id number] Patient Account Number: [id number] Date of Birth/Sex: 03-Mar-1946 (75 y.o. 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, including hip L97.822 Non-pressure chronic ulcer of other part of left lower leg with fat layer exposed I48.0 Paroxysmal atrial fibrillation I10 Essential (primary) hypertension Facility Procedures CPT4 Code: [id number] Description: (Facility Use Only) [id number] - [facility] COM[facility] LWR LT LEG Modifier: Quantity: 1 Physician Procedures CPT4 Code: 9678938 Description: 10175 - WC PHYS  LEVEL 3 - EST PT Modifier: Quantity: 1 CPT4 Code: Description: ICD-10 Diagnosis Description C44.729 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

## 2021-09-07 NOTE — Progress Notes (Signed)
Therapy Details Patient Name: [name], [name] Date of Service: 09/07/2021 9:15 AM Medical Record Number: [id number] Patient Account Number: [id number] Date of Birth/Sex: 1946-05-03 (75 y.o. M) Treating RN: Donnamarie Poag Primary Care Yash Cacciola: [name] Other Clinician: Referring Sona Nations: [name] Treating Soua Lenk/Extender: Skipper Cliche in Treatment: 2 Compression Therapy Performed for Wound Assessment: Wound #3 Left,Medial Lower Leg Performed By: Clinician Donnamarie Poag, RN Compression Type: Three Layer Post Procedure Diagnosis Same as Pre-procedure Electronic Signature(s) Signed: 09/07/2021 3:46:30 PM By: Donnamarie Poag Entered By: Donnamarie Poag on 09/07/2021 10:18:38 [name] ([id number]) -------------------------------------------------------------------------------- Encounter Discharge Information Details Patient Name: [name], [name] R. Date of Service: 09/07/2021 9:15 AM Medical Record Number: [id number] Patient Account Number: [id number] Date of Birth/Sex: 1946/08/03 (75 y.o. M) Treating RN: Donnamarie Poag Primary Care Pearly Apachito: [name] Other Clinician: Referring Dhalia Zingaro: [name] Treating Aleene Swanner/Extender: Skipper Cliche in Treatment: 2 Encounter Discharge Information Items Discharge Condition: Stable Ambulatory Status: Ambulatory Discharge Destination: Home Transportation: Private Auto Accompanied By: self Schedule Follow-up Appointment: Yes Clinical Summary of Care: Electronic Signature(s) Signed: 09/07/2021 3:46:30 PM By: Donnamarie Poag Entered By: Donnamarie Poag on 09/07/2021 10:39:31 [name] ([id number]) -------------------------------------------------------------------------------- Lower Extremity  Assessment Details Patient Name: [name], [name] R. Date of Service: 09/07/2021 9:15 AM Medical Record Number: [id number] Patient Account Number: [id number] Date of Birth/Sex: November 26, 1945 (75 y.o. M) Treating RN: Donnamarie Poag Primary Care Yehoshua Vitelli: [name] Other Clinician: Referring Xenia Nile: [name] Treating Giovanni Bath/Extender: Skipper Cliche in Treatment: 2 Edema Assessment Assessed: Shirlyn Goltz: Yes] [Right: No] Edema: [Left: N] [Right: o] Calf Left: Right: Point of Measurement: 40 cm From Medial Instep 39.5 cm Ankle Left: Right: Point of Measurement: 12 cm From Medial Instep 24 cm Knee To Floor Left: Right: From Medial Instep 50 cm Vascular Assessment Pulses: Dorsalis Pedis Palpable: [Left:Yes] Electronic Signature(s) Signed: 09/07/2021 3:46:30 PM By: Donnamarie Poag Entered By: Donnamarie Poag on 09/07/2021 09:35:47 Coldren, Marlou Starks ([id number]) -------------------------------------------------------------------------------- Multi Wound Chart Details Patient Name: [name] Nevin R. Date of Service: 09/07/2021 9:15 AM Medical Record Number: [id number] Patient Account Number: [id number] Date of Birth/Sex: [birth year] (75 y.o. M) Treating RN: Donnamarie Poag Primary Care Makyi Ledo: [name] Other Clinician: Referring Ladaja Yusupov: [name] Treating Hudsyn Barich/Extender: Skipper Cliche in Treatment: 2 Vital Signs Height(in): 75 Pulse(bpm): 17 Weight(lbs): 245 Blood Pressure(mmHg): 169/95 Body Mass Index(BMI): 31 Temperature(F): 98.3 Respiratory Rate(breaths/min): 16 Photos: [[name]:[name]] Wound Location: Left, Medial Lower Leg [name] [name] Wounding Event: Surgical Injury [name] [name] Primary Etiology: Open Surgical Wound [name] [name] Comorbid History: Arrhythmia, Hypertension, Type II [name] [name] Diabetes, Gout Date Acquired: 04/02/2021 [name] [name] Weeks of Treatment: 2 [name] [name] Wound Status: Open [name] [name] Measurements L x W x D (cm) 1.5x1.7x0.1 [name] [name] Area (cm) : 2.003 [name] [name] Volume  (cm) : 0.2 [name] [name] % Reduction in Area: 22.70% [name] [name] % Reduction in Volume: 61.40% [name] [name] Classification: Full Thickness Without Exposed [name] [name] Support Structures Exudate Amount: Medium [name] [name] Exudate Type: Serosanguineous [name] [name] Exudate Color: red, brown [name] [name] Granulation Amount: Large (67-100%) [name] [name] Granulation Quality: Red, Pink, Hyper-granulation [name] [name] Necrotic Amount: Small (1-33%) [name] [name] Exposed Structures: Fat Layer (Subcutaneous Tissue): [name] [name] Yes Fascia: No Tendon: No Muscle: No Joint: No Bone: No Epithelialization: None [name] [name] Treatment Notes Electronic Signature(s) Signed: 09/07/2021 3:46:30 PM By: Donnamarie Poag Entered By: Donnamarie Poag on 09/07/2021 09:36:29 Finlay, Marlou Starks ([id number]) -------------------------------------------------------------------------------- [place] Details Patient Name: [name], [name] R. Date of Service: 09/07/2021 9:15 AM Medical Record Number: [id number] Patient Account  Number: [id number] Date of Birth/Sex: [birth year] (75 y.o. M) Treating RN: Donnamarie Poag Primary Care Aubrea Meixner: [name] Other Clinician: Referring Conlee Sliter: [name] Treating Laisa Larrick/Extender: Skipper Cliche in Treatment: 2 Active Inactive Wound/Skin Impairment Nursing Diagnoses: Knowledge deficit related to ulceration/compromised skin integrity Goals: Patient/caregiver will verbalize understanding of skin care regimen Date Initiated: 08/24/2021 Date Inactivated: 09/07/2021 Target Resolution Date: 09/23/2021 Goal Status: Met Ulcer/skin breakdown will have a volume reduction of 30% by week 4 Date Initiated: 08/24/2021 Target Resolution Date: 10/24/2021 Goal Status: Active Ulcer/skin breakdown will have a volume reduction of 50% by week 8 Date Initiated: 08/24/2021 Target Resolution Date: 11/24/2021 Goal Status: Active Ulcer/skin breakdown will have a volume reduction of 80% by week 12 Date Initiated:  08/24/2021 Target Resolution Date: 12/22/2021 Goal Status: Active Ulcer/skin breakdown will heal within 14 weeks Date Initiated: 08/24/2021 Target Resolution Date: 01/22/2022 Goal Status: Active Interventions: Assess patient/caregiver ability to obtain necessary supplies Assess patient/caregiver ability to perform ulcer/skin care regimen upon admission and as needed Assess ulceration(s) every visit Notes: Electronic Signature(s) Signed: 09/07/2021 3:46:30 PM By: Donnamarie Poag Entered By: Donnamarie Poag on 09/07/2021 09:36:07 Elicker, Marlou Starks ([id number]) -------------------------------------------------------------------------------- Pain Assessment Details Patient Name: [name], [name] R. Date of Service: 09/07/2021 9:15 AM Medical Record Number: [id number] Patient Account Number: [id number] Date of Birth/Sex: 26-Sep-1946 (75 y.o. M) Treating RN: Donnamarie Poag Primary Care Ryin Ambrosius: [name] Other Clinician: Referring Senan Urey: [name] Treating Lennin Osmond/Extender: Skipper Cliche in Treatment: 2 Active Problems Location of Pain Severity and Description of Pain Patient Has Paino No Site Locations Rate the pain. Current Pain Level: 0 Pain Management and Medication Current Pain Management: Electronic Signature(s) Signed: 09/07/2021 3:46:30 PM By: Donnamarie Poag Entered By: Donnamarie Poag on 09/07/2021 09:28:08 [name] ([id number]) -------------------------------------------------------------------------------- Patient/Caregiver Education Details Patient Name: [name], [name] R. Date of Service: 09/07/2021 9:15 AM Medical Record Number: [id number] Patient Account Number: [id number] Date of Birth/Gender: [birth year] (75 y.o. M) Treating RN: Donnamarie Poag Primary Care Physician: [name] Other Clinician: Referring Physician: [name] Treating Physician/Extender: Skipper Cliche in Treatment: 2 Education Assessment Education Provided To: Patient Education Topics  Provided Wound/Skin Impairment: Electronic Signature(s) Signed: 09/07/2021 3:46:30 PM By: Donnamarie Poag Entered By: Donnamarie Poag on 09/07/2021 10:35:50 [name] ([id number]) -------------------------------------------------------------------------------- Wound Assessment Details Patient Name: [name], [name] R. Date of Service: 09/07/2021 9:15 AM Medical Record Number: [id number] Patient Account Number: [id number] Date of Birth/Sex: [birth year] (75 y.o. M) Treating RN: Donnamarie Poag Primary Care Ahrianna Siglin: [name] Other Clinician: Referring Jareth Pardee: [name] Treating Theoden Mauch/Extender: Skipper Cliche in Treatment: 2 Wound Status Wound Number: 3 Primary Etiology: Open Surgical Wound Wound Location: Left, Medial Lower Leg Wound Status: Open Wounding Event: Surgical Injury Comorbid History: Arrhythmia, Hypertension, Type II Diabetes, Gout Date Acquired: 04/02/2021 Weeks Of Treatment: 2 Clustered Wound: No Photos Wound Measurements Length: (cm) 1.5 Width: (cm) 1.7 Depth: (cm) 0.1 Area: (cm) 2.003 Volume: (cm) 0.2 % Reduction in Area: 22.7% % Reduction in Volume: 61.4% Epithelialization: None Tunneling: No Undermining: No Wound Description Classification: Full Thickness Without Exposed Support Structures Exudate Amount: Medium Exudate Type: Serosanguineous Exudate Color: red, brown Foul Odor After Cleansing: No Slough/Fibrino Yes Wound Bed Granulation Amount: Large (67-100%) Exposed Structure Granulation Quality: Red, Pink, Hyper-granulation Fascia Exposed: No Necrotic Amount: Small (1-33%) Fat Layer (Subcutaneous Tissue) Exposed: Yes Necrotic Quality: Adherent Slough Tendon Exposed: No Muscle Exposed: No Joint Exposed: No Bone Exposed: No Treatment Notes Wound #3 (Lower Leg) Wound Laterality: Left, Medial Cleanser Soap and Water Discharge Instruction: Gently cleanse wound  Number: [id number] Date of Birth/Sex: [birth year] (75 y.o. M) Treating RN: Donnamarie Poag Primary Care Aubrea Meixner: [name] Other Clinician: Referring Conlee Sliter: [name] Treating Laisa Larrick/Extender: Skipper Cliche in Treatment: 2 Active Inactive Wound/Skin Impairment Nursing Diagnoses: Knowledge deficit related to ulceration/compromised skin integrity Goals: Patient/caregiver will verbalize understanding of skin care regimen Date Initiated: 08/24/2021 Date Inactivated: 09/07/2021 Target Resolution Date: 09/23/2021 Goal Status: Met Ulcer/skin breakdown will have a volume reduction of 30% by week 4 Date Initiated: 08/24/2021 Target Resolution Date: 10/24/2021 Goal Status: Active Ulcer/skin breakdown will have a volume reduction of 50% by week 8 Date Initiated: 08/24/2021 Target Resolution Date: 11/24/2021 Goal Status: Active Ulcer/skin breakdown will have a volume reduction of 80% by week 12 Date Initiated:  08/24/2021 Target Resolution Date: 12/22/2021 Goal Status: Active Ulcer/skin breakdown will heal within 14 weeks Date Initiated: 08/24/2021 Target Resolution Date: 01/22/2022 Goal Status: Active Interventions: Assess patient/caregiver ability to obtain necessary supplies Assess patient/caregiver ability to perform ulcer/skin care regimen upon admission and as needed Assess ulceration(s) every visit Notes: Electronic Signature(s) Signed: 09/07/2021 3:46:30 PM By: Donnamarie Poag Entered By: Donnamarie Poag on 09/07/2021 09:36:07 Elicker, Marlou Starks ([id number]) -------------------------------------------------------------------------------- Pain Assessment Details Patient Name: [name], [name] R. Date of Service: 09/07/2021 9:15 AM Medical Record Number: [id number] Patient Account Number: [id number] Date of Birth/Sex: 26-Sep-1946 (75 y.o. M) Treating RN: Donnamarie Poag Primary Care Ryin Ambrosius: [name] Other Clinician: Referring Senan Urey: [name] Treating Lennin Osmond/Extender: Skipper Cliche in Treatment: 2 Active Problems Location of Pain Severity and Description of Pain Patient Has Paino No Site Locations Rate the pain. Current Pain Level: 0 Pain Management and Medication Current Pain Management: Electronic Signature(s) Signed: 09/07/2021 3:46:30 PM By: Donnamarie Poag Entered By: Donnamarie Poag on 09/07/2021 09:28:08 [name] ([id number]) -------------------------------------------------------------------------------- Patient/Caregiver Education Details Patient Name: [name], [name] R. Date of Service: 09/07/2021 9:15 AM Medical Record Number: [id number] Patient Account Number: [id number] Date of Birth/Gender: [birth year] (75 y.o. M) Treating RN: Donnamarie Poag Primary Care Physician: [name] Other Clinician: Referring Physician: [name] Treating Physician/Extender: Skipper Cliche in Treatment: 2 Education Assessment Education Provided To: Patient Education Topics  Provided Wound/Skin Impairment: Electronic Signature(s) Signed: 09/07/2021 3:46:30 PM By: Donnamarie Poag Entered By: Donnamarie Poag on 09/07/2021 10:35:50 [name] ([id number]) -------------------------------------------------------------------------------- Wound Assessment Details Patient Name: [name], [name] R. Date of Service: 09/07/2021 9:15 AM Medical Record Number: [id number] Patient Account Number: [id number] Date of Birth/Sex: [birth year] (75 y.o. M) Treating RN: Donnamarie Poag Primary Care Ahrianna Siglin: [name] Other Clinician: Referring Jareth Pardee: [name] Treating Theoden Mauch/Extender: Skipper Cliche in Treatment: 2 Wound Status Wound Number: 3 Primary Etiology: Open Surgical Wound Wound Location: Left, Medial Lower Leg Wound Status: Open Wounding Event: Surgical Injury Comorbid History: Arrhythmia, Hypertension, Type II Diabetes, Gout Date Acquired: 04/02/2021 Weeks Of Treatment: 2 Clustered Wound: No Photos Wound Measurements Length: (cm) 1.5 Width: (cm) 1.7 Depth: (cm) 0.1 Area: (cm) 2.003 Volume: (cm) 0.2 % Reduction in Area: 22.7% % Reduction in Volume: 61.4% Epithelialization: None Tunneling: No Undermining: No Wound Description Classification: Full Thickness Without Exposed Support Structures Exudate Amount: Medium Exudate Type: Serosanguineous Exudate Color: red, brown Foul Odor After Cleansing: No Slough/Fibrino Yes Wound Bed Granulation Amount: Large (67-100%) Exposed Structure Granulation Quality: Red, Pink, Hyper-granulation Fascia Exposed: No Necrotic Amount: Small (1-33%) Fat Layer (Subcutaneous Tissue) Exposed: Yes Necrotic Quality: Adherent Slough Tendon Exposed: No Muscle Exposed: No Joint Exposed: No Bone Exposed: No Treatment Notes Wound #3 (Lower Leg) Wound Laterality: Left, Medial Cleanser Soap and Water Discharge Instruction: Gently cleanse wound  with antibacterial soap, rinse and pat dry prior to dressing wounds Wound  Cleanser Discharge Instruction: Wash your hands with soap and water. Remove old dressing, discard into plastic bag and place into trash. Cleanse the wound with Wound Cleanser prior to applying a clean dressing using gauze sponges, not tissues or cotton balls. Do not [name], [name] R. ([id number]) scrub or use excessive force. Pat dry using gauze sponges, not tissue or cotton balls. Peri-Wound Care Topical Triamcinolone Acetonide Cream, 0.1%, 15 (g) tube Discharge Instruction: PERI-WOUND to leg Apply as directed by Magdelene Ruark. Theraderm Moisture Lotion Discharge Instruction: apply to dry skin of leg Primary Dressing Prisma 4.34 (in) Discharge Instruction: Moisten w/normal saline or sterile water; Cover wound as directed. Do not remove from wound bed. Secondary Dressing ABD Pad 5x9 (in/in) Discharge Instruction: Cover with ABD pad Secured With Compression Wrap Profore Lite LF 3 Multilayer Compression Bandaging System Discharge Instruction: Apply 3 multi-layer wrap as prescribed. Compression Stockings Add-Ons Electronic Signature(s) Signed: 09/07/2021 3:46:30 PM By: Donnamarie Poag Entered By: Donnamarie Poag on 09/07/2021 09:34:29 [name] ([id number]) -------------------------------------------------------------------------------- Vitals Details Patient Name: [name] Nevin R. Date of Service: 09/07/2021 9:15 AM Medical Record Number: [id number] Patient Account Number: [id number] Date of Birth/Sex: November 04, 1945 (75 y.o. M) Treating RN: Donnamarie Poag Primary Care Keah Lamba: [name] Other Clinician: Referring Ali Mclaurin: [name] Treating Thanvi Blincoe/Extender: Skipper Cliche in Treatment: 2 Vital Signs Time Taken: 09:27 Temperature (F): 98.3 Height (in): 75 Pulse (bpm): 73 Weight (lbs): 245 Respiratory Rate (breaths/min): 16 Body Mass Index (BMI): 30.6 Blood Pressure (mmHg): 169/95 Reference Range: 80 - 120 mg / dl Electronic Signature(s) Signed: 09/07/2021 3:46:30 PM  By: Donnamarie Poag Entered ByDonnamarie Poag on 09/07/2021 09:27:59

## 2021-09-14 ENCOUNTER — Other Ambulatory Visit: Payer: Self-pay

## 2021-09-14 ENCOUNTER — Encounter: Payer: Medicare PPO | Admitting: Physician Assistant

## 2021-09-14 DIAGNOSIS — L97822 Non-pressure chronic ulcer of other part of left lower leg with fat layer exposed: Secondary | ICD-10-CM | POA: Diagnosis not present

## 2021-09-14 DIAGNOSIS — Z85828 Personal history of other malignant neoplasm of skin: Secondary | ICD-10-CM | POA: Diagnosis not present

## 2021-09-14 DIAGNOSIS — I10 Essential (primary) hypertension: Secondary | ICD-10-CM | POA: Diagnosis not present

## 2021-09-14 DIAGNOSIS — I480 Paroxysmal atrial fibrillation: Secondary | ICD-10-CM | POA: Diagnosis not present

## 2021-09-14 DIAGNOSIS — T8189XA Other complications of procedures, not elsewhere classified, initial encounter: Secondary | ICD-10-CM | POA: Diagnosis not present

## 2021-09-14 NOTE — Progress Notes (Signed)
been seen at the hospital within the last three years: Yes Total Score: 0 Level Of Care: ____ [name] ([id number]) Electronic Signature(s) Signed: 09/14/2021 4:34:48 PM By: Carlene Coria RN Entered By: Carlene Coria on 09/14/2021 14:47:04 [name] ([id number]) -------------------------------------------------------------------------------- Compression Therapy Details Patient Name: [name], [name] Date of Service: 09/14/2021 2:00 PM Medical Record Number: [id number] Patient Account Number: [id number] Date of Birth/Sex: 11-16-1945 (75 y.o. M) Treating RN: Carlene Coria Primary Care Shahzain Kiester: [name] Other Clinician: Referring Erdine Hulen: [name] Treating Pavneet Markwood/Extender: Skipper Cliche in Treatment: 3 Compression Therapy Performed for Wound Assessment: Wound #3 Left,Medial Lower Leg Performed By: Clinician Carlene Coria, RN Compression Type: Three Layer Post Procedure Diagnosis Same as Pre-procedure Electronic Signature(s) Signed: 09/14/2021 4:34:48 PM By: Carlene Coria RN Entered By: Carlene Coria on 09/14/2021 14:45:17 [name] ([id number]) -------------------------------------------------------------------------------- Encounter Discharge Information Details Patient Name: [name], [name] R. Date of Service: 09/14/2021 2:00 PM Medical Record Number: [id number] Patient Account Number: [id number] Date of Birth/Sex: Jun 07, 1946 (75 y.o. M) Treating RN: Carlene Coria Primary Care Jatasia Gundrum: [name] Other Clinician: Referring Latravia Southgate: [name] Treating Marchello Rothgeb/Extender: Skipper Cliche in Treatment: 3 Encounter Discharge Information Items Discharge Condition: Stable Ambulatory Status: Ambulatory Discharge Destination: Home Transportation: Private Auto Accompanied By: self Schedule Follow-up  Appointment: Yes Clinical Summary of Care: Patient Declined Electronic Signature(s) Signed: 09/14/2021 4:34:48 PM By: Carlene Coria RN Entered By: Carlene Coria on 09/14/2021 14:58:11 [name] ([id number]) -------------------------------------------------------------------------------- Lower Extremity Assessment Details Patient Name: [name], [name] R. Date of Service: 09/14/2021 2:00 PM Medical Record Number: [id number] Patient Account Number: [id number] Date of Birth/Sex: 02/24/1946 (75 y.o. M) Treating RN: Carlene Coria Primary Care Fionn Stracke: [name] Other Clinician: Referring Ghassan Coggeshall: [name] Treating Tiaira Arambula/Extender: Skipper Cliche in Treatment: 3 Edema Assessment Assessed: [Left: No] [Right: No] Edema: [Left: Ye] [Right: s] Calf Left: Right: Point of Measurement: 40 cm From Medial Instep 38 cm Ankle Left: Right: Point of Measurement: 12 cm From Medial Instep 23 cm Vascular Assessment Pulses: Dorsalis Pedis Palpable: [Left:Yes] Electronic Signature(s) Signed: 09/14/2021 4:34:48 PM By: Carlene Coria RN Entered By: Carlene Coria on 09/14/2021 14:20:10 [name] ([id number]) -------------------------------------------------------------------------------- Multi Wound Chart Details Patient Name: [name] Nevin R. Date of Service: 09/14/2021 2:00 PM Medical Record Number: [id number] Patient Account Number: [id number] Date of Birth/Sex: [birth year] (75 y.o. M) Treating RN: Carlene Coria Primary Care Kailon Treese: [name] Other Clinician: Referring Imoni Kohen: [name] Treating Denica Web/Extender: Skipper Cliche in Treatment: 3 Vital Signs Height(in): 75 Pulse(bpm): 27 Weight(lbs): 245 Blood Pressure(mmHg): 187/84 Body Mass Index(BMI): 31 Temperature(F): 98.2 Respiratory Rate(breaths/min): 18 Photos: [[name]:[name]] Wound Location: Left, Medial Lower Leg [name] [name] Wounding Event: Surgical Injury [name] [name] Primary Etiology: Open Surgical Wound  [name] [name] Comorbid History: Arrhythmia, Hypertension, Type II [name] [name] Diabetes, Gout Date Acquired: 04/02/2021 [name] [name] Weeks of Treatment: 3 [name] [name] Wound Status: Open [name] [name] Measurements L x W x D (cm) 1.2x1.7x0.1 [name] [name] Area (cm) : 1.602 [name] [name] Volume (cm) : 0.16 [name] [name] % Reduction in Area: 38.20% [name] [name] % Reduction in Volume: 69.10% [name] [name] Classification: Full Thickness Without Exposed [name] [name] Support Structures Exudate Amount: Medium [name] [name] Exudate Type: Serosanguineous [name] [name] Exudate Color: red, brown [name] [name] Granulation Amount: Large (67-100%) [name] [name] Granulation Quality: Red, Pink, Hyper-granulation [name] [name] Necrotic Amount: Small (1-33%) [name] [name] Exposed Structures: Fat Layer (Subcutaneous Tissue): [name] [name] Yes Fascia: No Tendon: No Muscle: No Joint: No Bone: No Epithelialization: Small (1-33%) [name] [name] Treatment Notes Electronic  Signature(s) Signed: 09/14/2021 4:34:48 PM By: Carlene Coria RN Entered By: Carlene Coria on 09/14/2021 14:45:01 [name] ([id number]) -------------------------------------------------------------------------------- [place] Details Patient Name: [name], BARGE. Date of Service: 09/14/2021 2:00 PM Medical Record Number: [id number] Patient Account Number: [id number] Date of Birth/Sex: 1946-04-30 (75 y.o. M) Treating RN: Carlene Coria Primary Care Yazlyn Wentzel: [name] Other Clinician: Referring Mette Southgate: [name] Treating Marte Celani/Extender: Skipper Cliche in Treatment: 3 Active Inactive Wound/Skin Impairment Nursing Diagnoses: Knowledge deficit related to ulceration/compromised skin integrity Goals: Patient/caregiver will verbalize understanding of skin care regimen Date Initiated: 08/24/2021 Date Inactivated: 09/07/2021 Target Resolution Date: 09/23/2021 Goal Status: Met Ulcer/skin breakdown will have a volume reduction of 30% by week 4 Date Initiated: 08/24/2021 Target Resolution Date:  10/24/2021 Goal Status: Active Ulcer/skin breakdown will have a volume reduction of 50% by week 8 Date Initiated: 08/24/2021 Target Resolution Date: 11/24/2021 Goal Status: Active Ulcer/skin breakdown will have a volume reduction of 80% by week 12 Date Initiated: 08/24/2021 Target Resolution Date: 12/22/2021 Goal Status: Active Ulcer/skin breakdown will heal within 14 weeks Date Initiated: 08/24/2021 Target Resolution Date: 01/22/2022 Goal Status: Active Interventions: Assess patient/caregiver ability to obtain necessary supplies Assess patient/caregiver ability to perform ulcer/skin care regimen upon admission [name] as needed Assess ulceration(s) every visit Notes: Electronic Signature(s) Signed: 09/14/2021 4:34:48 PM By: Carlene Coria RN Entered By: Carlene Coria on 09/14/2021 14:44:48 [place], Marlou Starks ([id number]) -------------------------------------------------------------------------------- Pain Assessment Details Patient Name: [name], [name] R. Date of Service: 09/14/2021 2:00 PM Medical Record Number: [id number] Patient Account Number: [id number] Date of Birth/Sex: [birth year] (75 y.o. M) Treating RN: Carlene Coria Primary Care Merland Holness: [name] Other Clinician: Referring Jess Toney: [name] Treating Jodey Burbano/Extender: Skipper Cliche in Treatment: 3 Active Problems Location of Pain Severity [name] Description of Pain Patient Has Paino No Site Locations Pain Management [name] Medication Current Pain Management: Electronic Signature(s) Signed: 09/14/2021 4:34:48 PM By: Carlene Coria RN Entered By: Carlene Coria on 09/14/2021 14:12:54 [name] ([id number]) -------------------------------------------------------------------------------- Patient/Caregiver Education Details Patient Name: [name], [name] Date of Service: 09/14/2021 2:00 PM Medical Record Number: [id number] Patient Account Number: [id number] Date of Birth/Gender: Mar 04, 1946 (75 y.o. M) Treating RN:  Carlene Coria Primary Care Physician: [name] Other Clinician: Referring Physician: [name] Treating Physician/Extender: Skipper Cliche in Treatment: 3 Education Assessment Education Provided To: Patient Education Topics Provided Wound/Skin Impairment: Methods: Explain/Verbal Responses: State content correctly Electronic Signature(s) Signed: 09/14/2021 4:34:48 PM By: Carlene Coria RN Entered By: Carlene Coria on 09/14/2021 14:57:31 [place], Marlou Starks ([id number]) -------------------------------------------------------------------------------- Wound Assessment Details Patient Name: LUDWIN, [name] R. Date of Service: 09/14/2021 2:00 PM Medical Record Number: [id number] Patient Account Number: [id number] Date of Birth/Sex: 06-20-1946 (75 y.o. M) Treating RN: Carlene Coria Primary Care Zan Orlick: [name] Other Clinician: Referring Zayvon Alicea: [name] Treating Lamarr Feenstra/Extender: Skipper Cliche in Treatment: 3 Wound Status Wound Number: 3 Primary Etiology: Open Surgical Wound Wound Location: Left, Medial Lower Leg Wound Status: Open Wounding Event: Surgical Injury Comorbid History: Arrhythmia, Hypertension, Type II Diabetes, Gout Date Acquired: 04/02/2021 Weeks Of Treatment: 3 Clustered Wound: No Photos Wound Measurements Length: (cm) 1.2 Width: (cm) 1.7 Depth: (cm) 0.1 Area: (cm) 1.602 Volume: (cm) 0.16 % Reduction in Area: 38.2% % Reduction in Volume: 69.1% Epithelialization: Small (1-33%) Tunneling: No Undermining: No Wound Description Classification: Full Thickness Without Exposed Support Structures Exudate Amount: Medium Exudate Type: Serosanguineous Exudate Color: red, brown Foul Odor After Cleansing: No Slough/Fibrino Yes Wound Bed Granulation Amount: Large (67-100%) Exposed Structure Granulation Quality: Red, Pink, Hyper-granulation Fascia Exposed: No  Signature(s) Signed: 09/14/2021 4:34:48 PM By: Carlene Coria RN Entered By: Carlene Coria on 09/14/2021 14:45:01 [name] ([id number]) -------------------------------------------------------------------------------- [place] Details Patient Name: [name], BARGE. Date of Service: 09/14/2021 2:00 PM Medical Record Number: [id number] Patient Account Number: [id number] Date of Birth/Sex: 1946-04-30 (75 y.o. M) Treating RN: Carlene Coria Primary Care Yazlyn Wentzel: [name] Other Clinician: Referring Mette Southgate: [name] Treating Marte Celani/Extender: Skipper Cliche in Treatment: 3 Active Inactive Wound/Skin Impairment Nursing Diagnoses: Knowledge deficit related to ulceration/compromised skin integrity Goals: Patient/caregiver will verbalize understanding of skin care regimen Date Initiated: 08/24/2021 Date Inactivated: 09/07/2021 Target Resolution Date: 09/23/2021 Goal Status: Met Ulcer/skin breakdown will have a volume reduction of 30% by week 4 Date Initiated: 08/24/2021 Target Resolution Date:  10/24/2021 Goal Status: Active Ulcer/skin breakdown will have a volume reduction of 50% by week 8 Date Initiated: 08/24/2021 Target Resolution Date: 11/24/2021 Goal Status: Active Ulcer/skin breakdown will have a volume reduction of 80% by week 12 Date Initiated: 08/24/2021 Target Resolution Date: 12/22/2021 Goal Status: Active Ulcer/skin breakdown will heal within 14 weeks Date Initiated: 08/24/2021 Target Resolution Date: 01/22/2022 Goal Status: Active Interventions: Assess patient/caregiver ability to obtain necessary supplies Assess patient/caregiver ability to perform ulcer/skin care regimen upon admission [name] as needed Assess ulceration(s) every visit Notes: Electronic Signature(s) Signed: 09/14/2021 4:34:48 PM By: Carlene Coria RN Entered By: Carlene Coria on 09/14/2021 14:44:48 [place], Marlou Starks ([id number]) -------------------------------------------------------------------------------- Pain Assessment Details Patient Name: [name], [name] R. Date of Service: 09/14/2021 2:00 PM Medical Record Number: [id number] Patient Account Number: [id number] Date of Birth/Sex: [birth year] (75 y.o. M) Treating RN: Carlene Coria Primary Care Merland Holness: [name] Other Clinician: Referring Jess Toney: [name] Treating Jodey Burbano/Extender: Skipper Cliche in Treatment: 3 Active Problems Location of Pain Severity [name] Description of Pain Patient Has Paino No Site Locations Pain Management [name] Medication Current Pain Management: Electronic Signature(s) Signed: 09/14/2021 4:34:48 PM By: Carlene Coria RN Entered By: Carlene Coria on 09/14/2021 14:12:54 [name] ([id number]) -------------------------------------------------------------------------------- Patient/Caregiver Education Details Patient Name: [name], [name] Date of Service: 09/14/2021 2:00 PM Medical Record Number: [id number] Patient Account Number: [id number] Date of Birth/Gender: Mar 04, 1946 (75 y.o. M) Treating RN:  Carlene Coria Primary Care Physician: [name] Other Clinician: Referring Physician: [name] Treating Physician/Extender: Skipper Cliche in Treatment: 3 Education Assessment Education Provided To: Patient Education Topics Provided Wound/Skin Impairment: Methods: Explain/Verbal Responses: State content correctly Electronic Signature(s) Signed: 09/14/2021 4:34:48 PM By: Carlene Coria RN Entered By: Carlene Coria on 09/14/2021 14:57:31 [place], Marlou Starks ([id number]) -------------------------------------------------------------------------------- Wound Assessment Details Patient Name: LUDWIN, [name] R. Date of Service: 09/14/2021 2:00 PM Medical Record Number: [id number] Patient Account Number: [id number] Date of Birth/Sex: 06-20-1946 (75 y.o. M) Treating RN: Carlene Coria Primary Care Zan Orlick: [name] Other Clinician: Referring Zayvon Alicea: [name] Treating Lamarr Feenstra/Extender: Skipper Cliche in Treatment: 3 Wound Status Wound Number: 3 Primary Etiology: Open Surgical Wound Wound Location: Left, Medial Lower Leg Wound Status: Open Wounding Event: Surgical Injury Comorbid History: Arrhythmia, Hypertension, Type II Diabetes, Gout Date Acquired: 04/02/2021 Weeks Of Treatment: 3 Clustered Wound: No Photos Wound Measurements Length: (cm) 1.2 Width: (cm) 1.7 Depth: (cm) 0.1 Area: (cm) 1.602 Volume: (cm) 0.16 % Reduction in Area: 38.2% % Reduction in Volume: 69.1% Epithelialization: Small (1-33%) Tunneling: No Undermining: No Wound Description Classification: Full Thickness Without Exposed Support Structures Exudate Amount: Medium Exudate Type: Serosanguineous Exudate Color: red, brown Foul Odor After Cleansing: No Slough/Fibrino Yes Wound Bed Granulation Amount: Large (67-100%) Exposed Structure Granulation Quality: Red, Pink, Hyper-granulation Fascia Exposed: No  AQUIL, DUHE ([id number]) Visit Report for 09/14/2021 Arrival Information Details Patient Name: [name], [name] Date of Service: 09/14/2021 2:00 PM Medical Record Number: [id number] Patient Account Number: [id number] Date of Birth/Sex: 1946/05/10 (75 y.o. M) Treating RN: Carlene Coria Primary Care Kidada Ging: [name] Other Clinician: Referring Taraneh Metheney: [name] Treating Terricka Onofrio/Extender: Skipper Cliche in Treatment: 3 Visit Information History Since Last Visit All ordered tests [name] consults were completed: No Patient Arrived: Ambulatory Added or deleted any medications: No Arrival Time: 14:06 Any new allergies or adverse reactions: No Accompanied By: self Had a fall or experienced change in No Transfer Assistance: None activities of daily living that may affect Patient Identification Verified: Yes risk of falls: Secondary Verification Process Completed: Yes Signs or symptoms of abuse/neglect since last visito No Patient Requires Transmission-Based No Hospitalized since last visit: No Precautions: Implantable device outside of the clinic excluding No Patient Has Alerts: Yes cellular tissue based products placed in the center Patient Alerts: Patient on Blood since last visit: Thinner Has Dressing in Place as Prescribed: Yes non compressable Has Compression in Place as Prescribed: Yes Pain Present Now: No Electronic Signature(s) Signed: 09/14/2021 4:34:48 PM By: Carlene Coria RN Entered By: Carlene Coria on 09/14/2021 14:11:30 [name] ([id number]) -------------------------------------------------------------------------------- Clinic Level of Care Assessment Details Patient Name: COLSTON, PYLE R. Date of Service: 09/14/2021 2:00 PM Medical Record Number: [id number] Patient Account Number: [id number] Date of Birth/Sex: 1946/08/12 (75 y.o. M) Treating RN: Carlene Coria Primary Care Jonatha Gagen: [name] Other Clinician: Referring Jessa Stinson: [name] Treating Alisandra Son/Extender: Skipper Cliche in Treatment: 3 Clinic Level of Care Assessment Items TOOL 1 Quantity Score _0  - Use when EandM [name] Procedure is performed on INITIAL visit 0 ASSESSMENTS - Nursing Assessment / Reassessment _1  - General Physical Exam (combine w/ comprehensive assessment (listed just below) when performed on new 0 pt. evals) _2  - 0 Comprehensive Assessment (HX, ROS, Risk Assessments, Wounds Hx, etc.) ASSESSMENTS - Wound [name] Skin Assessment / Reassessment _3  - Dermatologic / Skin Assessment (not related to wound area) 0 ASSESSMENTS - Ostomy [name]/or Continence Assessment [name] Care _4  - Incontinence Assessment [name] Management 0 _5  - 0 Ostomy Care Assessment [name] Management (repouching, etc.) PROCESS - Coordination of Care _6  - Simple Patient / Family Education for ongoing care 0 _7  - 0 Complex (extensive) Patient / Family Education for ongoing care _8  - 0 Staff obtains Programmer, systems, Records, Test Results / Process Orders _9  - 0 Staff telephones HHA, Nursing Homes / Clarify orders / etc _10  - 0 Routine Transfer to another Facility (non-emergent condition) _11  - 0 Routine Hospital Admission (non-emergent condition) _12  - 0 New Admissions / Biomedical engineer / Ordering NPWT, Apligraf, etc. _13  - 0 Emergency Hospital Admission (emergent condition) PROCESS - Special Needs _14  - Pediatric / Minor Patient Management 0 _15  - 0 Isolation Patient Management _16  - 0 Hearing / Language / Visual special needs _17  - 0 Assessment of Community assistance (transportation, [name], etc.) _18  - 0 Additional assistance / Altered mentation _19  - 0 Support Surface(s) Assessment (bed, cushion, seat, etc.) INTERVENTIONS - Miscellaneous _20  - External ear exam 0 _21  - 0 Patient Transfer (multiple staff / Civil Service fast streamer / Similar devices) _22  - 0 Simple Staple / Suture removal (25 or less) _23  - 0 Complex Staple / Suture removal (26 or more) _24  - 0 Hypo/Hyperglycemic  Management (do not check if billed separately) _25  - 0 Ankle / Brachial Index (ABI) - do not check if billed separately Has the patient

## 2021-09-14 NOTE — Progress Notes (Addendum)
EDEL, RIVERO ([id number]) Visit Report for 09/14/2021 Chief Complaint Document Details Patient Name: [name], [name] Date of Service: 09/14/2021 2:00 PM Medical Record Number: [id number] Patient Account Number: [id number] Date of Birth/Sex: 1945-12-04 (75 y.o. M) Treating RN: Carlene Coria Primary Care Provider: [name] Other Clinician: Referring Provider: [name] Treating Provider/Extender: Skipper Cliche in Treatment: 3 Information Obtained from: Patient Chief Complaint Left LE Ulcer Electronic Signature(s) Signed: 09/14/2021 2:32:59 PM By: [name] PA-C Entered By: [name] on 09/14/2021 14:32:58 Recinos, Marlou Starks ([id number]) -------------------------------------------------------------------------------- HPI Details Patient Name: [name], [name] Date of Service: 09/14/2021 2:00 PM Medical Record Number: [id number] Patient Account Number: [id number] Date of Birth/Sex: [birth year] (75 y.o. M) Treating RN: Carlene Coria Primary Care Provider: [name] Other Clinician: Referring Provider: [name] Treating Provider/Extender: Skipper Cliche in Treatment: 3 History of Present Illness HPI Description: 75 year old male who has a past medical history of essential hypertension, chronic atrial fibrillation, peripheral vascular disease, nonischemic cardiomyopathy,venous stasis dermatitis, gouty arthropathy, basal cell carcinoma of the right lower extremity, benign prostatic hypertrophy, long-term use of anticoagulation therapy, hyperglycemia and exercise intolerance has never been a smoker. the patient has had a vascular workup over 7 years ago and said everything was normal at that stage. He does not have any chronic problems except for cardiac issues which he sees a cardiologist in [place]. 08/15/2017 -- arterial and venous duplex studies still pending. 08/23/2017 -- venous reflux studies done on 08/13/2017 shows venous incompetence  throughout the left lower extremity deep system and focally at the left saphenofemoral junction. 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 No evidence of SVT or DVT in bilateral lower extremities The patient has an appointment at the end of the month to get his arterial duplex study done 09/05/2017 -- the patient was seen at the vein and vascular office yesterday by Angelena Form. ABI studies were notable for medial calcification and the toe brachial indices were normal and bilateral ankle-brachial) waveforms were normal with triphasic flow. After review of his venous studies he was not a candidate for laser ablation and his lymphedema was to be treated with compression stockings and lymphedema pump pumps 09/12/2017 -- had a low arterial study done at the [facility] [facility] -- unable to obtain reliable ABI is due to medial calcification. Bilateral toe indices were normal with the right being 1.01 and the left being 0.92 and the waveforms were triphasic bilaterally. he did get hold of 30-40 mm compression stockings but is unable to put these on. We will try and get him alternative compression stockings. 09/26/17- he is here in follow up evaluation of a right lower extremity ulcer;he is compliant in wearing compression stocking; ulcer almost epithelialized , anticipate healing next appointment Readmission: 11/17 point upon evaluation patient's wound currently that he is seeing [place] for today is a skin cancerous lesion that was cleared away by his dermatologist on the left medial calf region. He tells me that this is a very similar thing to what he had done previously in fact the last time he saw him in 2018 this was also what was going on at that point. Nonetheless he feels that based on what he seeing currently that this is just having a lot of harder time healing although it is much closer to the surface than what he is experienced in the past. He notes that the  initial removal was in June 2022 which was this year this is now  November and still has not closed. He does have some edema and definitely I think that there is some venous component to his slow healing here. Also think that we can do something better than Vaseline to try to help with getting this to clear up as quickly as possible. He does have a history of atrial fibrillation and is on Eliquis otherwise he really has no major medical problems that would affect wound healing. 09/07/2021 upon evaluation today patient actually appears to be doing significantly better after having wrapped him last week. Overall I think that this is making significant improvements at this time which is great news. I do not see any evidence of infection which is great news as well. No fevers, chills, nausea, vomiting, or diarrhea. 09/14/2021 upon evaluation today patient appears to be doing well with regard to his leg ulcer. He has been tolerating the dressing changes and overall I think that he is making excellent progress. I do not see any signs of active infection at this time. Electronic Signature(s) Signed: 09/14/2021 5:42:14 PM By: [name] PA-C Entered By: [name] on 09/14/2021 17:42:14 [name], [name] ([id number]) -------------------------------------------------------------------------------- Physical Exam Details Patient Name: [name], [name] R. Date of Service: 09/14/2021 2:00 PM Medical Record Number: [id number] Patient Account Number: [id number] Date of Birth/Sex: May 26, 1946 (75 y.o. M) Treating RN: Carlene Coria Primary Care Provider: [name] Other Clinician: Referring Provider: [name] Treating Provider/Extender: Skipper Cliche in Treatment: 3 Constitutional Well-nourished and well-hydrated in no acute distress. Respiratory normal breathing without difficulty. Psychiatric this patient is able to make decisions and demonstrates good insight into disease process. Alert  and Oriented x 3. pleasant and cooperative. Notes Patient's wound bed showed evidence of good granulation epithelization I think that we are significantly smaller even compared to last week and from the beginning we have made a lot of progress. I think we are headed in the right track and I would recommend that such that we continue with the plan. Electronic Signature(s) Signed: 09/14/2021 5:42:31 PM By: [name] PA-C Entered By: [name] on 09/14/2021 17:42:30 [name], [name] ([id number]) -------------------------------------------------------------------------------- Physician Orders Details Patient Name: [name], [name] Date of Service: 09/14/2021 2:00 PM Medical Record Number: [id number] Patient Account Number: [id number] Date of Birth/Sex: 1946-06-10 (75 y.o. 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 o Nurse Visit as needed Bathing/ Shower/ Hygiene o May shower with wound dressing protected with water repellent cover or cast protector. o No tub bath. Anesthetic (Use 'Patient Medications' Section for Anesthetic Order Entry) o Lidocaine applied to wound bed Edema Control - Lymphedema / Segmental Compressive Device / Other o Elevate, Exercise Daily and Avoid Standing for Long Periods of Time. o Elevate legs to the level of the heart and pump ankles as often as possible o Elevate leg(s) parallel to the floor when sitting. Additional Orders /  Instructions o Follow Nutritious Diet and Increase Protein Intake Wound Treatment Wound #3 - Lower Leg Wound Laterality: Left, Medial Cleanser: Soap and Water 1 x Per Week/15 Days Discharge Instructions: Gently cleanse wound with antibacterial  soap, rinse and pat dry prior to dressing wounds Cleanser: Wound Cleanser 1 x Per Week/15 Days Discharge Instructions: Wash your hands with soap and water. Remove old dressing, discard into plastic bag and place into trash. Cleanse the wound with Wound Cleanser prior to applying a clean dressing using gauze sponges, not tissues or cotton balls. Do not scrub or use excessive force. Pat dry using gauze sponges, not tissue or cotton balls. Topical: Triamcinolone Acetonide Cream, 0.1%, 15 (g) tube 1 x Per Week/15 Days Discharge Instructions: PERI-WOUND to leg Apply as directed by provider. Topical: Theraderm Moisture Lotion 1 x Per Week/15 Days Discharge Instructions: apply to dry skin of leg Primary Dressing: Prisma 4.34 (in) 1 x Per Week/15 Days Discharge Instructions: Moisten w/normal saline or sterile water; Cover wound as directed. Do not remove from wound bed. Secondary Dressing: ABD Pad 5x9 (in/in) 1 x Per Week/15 Days Discharge Instructions: Cover with ABD pad Compression Wrap: Profore Lite LF 3 Multilayer Compression Bandaging System 1 x Per Week/15 Days Discharge Instructions: Apply 3 multi-layer wrap as prescribed. [name], [name] ([id number]) Electronic Signature(s) Signed: 09/14/2021 4:34:48 PM By: Carlene Coria RN Signed: 09/14/2021 6:23:09 PM By: [name] PA-C Entered By: Carlene Coria on 09/14/2021 14:46:18 [name], [name] ([id number]) -------------------------------------------------------------------------------- Problem List Details Patient Name: [name], [name] Date of Service: 09/14/2021 2:00 PM Medical Record Number: [id number] Patient Account Number: [id number] Date of Birth/Sex: [birth year] (75 y.o. 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 Date of Service: 09/14/2021 2:00 PM Medical Record Number: [id number] Patient Account Number: [id number] Date of Birth/Sex: 1945/10/25 (75 y.o. M) Treating RN: Carlene Coria Primary Care Provider: [name] Other Clinician: Referring Provider: [name] Treating Provider/Extender: Skipper Cliche in Treatment: 3 Subjective Chief Complaint Information obtained from Patient Left LE Ulcer History of Present Illness (HPI) 75 year old male who has a past medical history of essential hypertension, chronic atrial fibrillation, peripheral vascular disease, nonischemic cardiomyopathy,venous stasis dermatitis, gouty arthropathy, basal cell carcinoma of the right lower extremity, benign prostatic hypertrophy, long- term use of anticoagulation therapy, hyperglycemia and exercise intolerance has never been a smoker. the patient has had a vascular workup over 7 years ago and said everything was normal at that stage. He does not have any chronic problems except for cardiac issues which he sees a cardiologist in  [place]. 08/15/2017 -- arterial and venous duplex studies still pending. 08/23/2017 -- venous reflux studies done on 08/13/2017 shows venous incompetence throughout the left lower extremity deep system and focally at the left saphenofemoral junction. 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 No evidence of SVT or DVT in bilateral lower extremities The patient has an appointment at the end of the month to get his arterial duplex study done 09/05/2017 -- the patient was seen at the vein and vascular office yesterday by Angelena Form. ABI studies were  notable for medial calcification and the toe brachial indices were normal and bilateral ankle-brachial) waveforms were normal with triphasic flow. After review of his venous studies he was not a candidate for laser ablation and his lymphedema was to be treated with compression stockings and lymphedema pump pumps 09/12/2017 -- had a low arterial study done at the [place] [facility] -- unable to obtain reliable ABI is due to medial calcification. Bilateral toe indices were normal with the right being 1.01 and the left being 0.92 and the waveforms were triphasic bilaterally. he did get hold of 30-40 mm compression stockings but is unable to put these on. We will try and get him alternative compression stockings. 09/26/17- he is here in follow up evaluation of a right lower extremity ulcer;he is compliant in wearing compression stocking; ulcer almost epithelialized , anticipate healing next appointment Readmission: 11/17 point upon evaluation patient's wound currently that he is seeing [place] for today is a skin cancerous lesion that was cleared away by his dermatologist on the left medial calf region. He tells me that this is a very similar thing to what he had done previously in fact the last time he saw him in 2018 this was also what was going on at that point. Nonetheless he feels that based on what he seeing currently  that this is just having a lot of harder time healing although it is much closer to the surface than what he is experienced in the past. He notes that the initial removal was in June 2022 which was this year this is now November and still has not closed. He does have some edema and definitely I think that there is some venous component to his slow healing here. Also think that we can do something better than Vaseline to try to help with getting this to clear up as quickly as possible. He does have a history of atrial fibrillation and is on Eliquis otherwise he really has no major medical problems that would affect wound healing. 09/07/2021 upon evaluation today patient actually appears to be doing significantly better after having wrapped him last week. Overall I think that this is making significant improvements at this time which is great news. I do not see any evidence of infection which is great news as well. No fevers, chills, nausea, vomiting, or diarrhea. 09/14/2021 upon evaluation today patient appears to be doing well with regard to his leg ulcer. He has been tolerating the dressing changes and overall I think that he is making excellent progress. I do not see any signs of active infection at this time. Objective Constitutional Well-nourished and well-hydrated in no acute distress. Vitals Time Taken: 2:12 PM, Height: 75 in, Weight: 245 lbs, BMI: 30.6, Temperature: 98.2 F, Pulse: 49 bpm, Respiratory Rate: 18 breaths/min, Blood Pressure: 187/84 mmHg. [name], [name] R. ([id number]) Respiratory normal breathing without difficulty. Psychiatric this patient is able to make decisions and demonstrates good insight into disease process. Alert and Oriented x 3. pleasant and cooperative. General Notes: Patient's wound bed showed evidence of good granulation epithelization I think that we are significantly smaller even compared to last week and from the beginning we have made a lot of progress. I  think we are headed in the right track and I would recommend that such that we continue with the plan. Integumentary (Hair, Skin) Wound #3 status is Open. Original cause of wound was Surgical Injury. The date acquired was: 04/02/2021. The wound has been in treatment 3 weeks. The  wound is located on the Left,Medial Lower Leg. The wound measures 1.2cm length x 1.7cm width x 0.1cm depth; 1.602cm^2 area and 0.16cm^3 volume. There is Fat Layer (Subcutaneous Tissue) exposed. There is no tunneling or undermining noted. There is a medium amount of serosanguineous drainage noted. There is large (67-100%) red, pink, hyper - granulation within the wound bed. There is a small (1-33%) amount of necrotic tissue within the wound bed including Adherent Slough. 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 There was a Three Layer Compression Therapy Procedure by Carlene Coria, RN. Post procedure Diagnosis Wound #3: Same as Pre-Procedure Plan Follow-up Appointments: Return Appointment in 1 week. Nurse Visit as needed Bathing/ Shower/ Hygiene: May shower with wound dressing protected with water repellent cover or cast protector. No tub bath. Anesthetic (Use 'Patient Medications' Section for Anesthetic Order Entry): Lidocaine applied to wound bed Edema Control - Lymphedema / Segmental Compressive Device / Other: Elevate, Exercise Daily and Avoid Standing for Long Periods of Time. Elevate legs to the level of the heart and pump ankles as often as possible Elevate leg(s) parallel to the floor when sitting. Additional Orders / Instructions: Follow Nutritious Diet and Increase Protein  Intake WOUND #3: - Lower Leg Wound Laterality: Left, Medial Cleanser: Soap and Water 1 x Per Week/15 Days Discharge Instructions: Gently cleanse wound with antibacterial soap, rinse and pat dry prior to dressing wounds Cleanser: Wound Cleanser 1 x Per Week/15 Days Discharge Instructions: Wash your hands with soap and water. Remove old dressing, discard into plastic bag and place into trash. Cleanse the wound with Wound Cleanser prior to applying a clean dressing using gauze sponges, not tissues or cotton balls. Do not scrub or use excessive force. Pat dry using gauze sponges, not tissue or cotton balls. Topical: Triamcinolone Acetonide Cream, 0.1%, 15 (g) tube 1 x Per Week/15 Days Discharge Instructions: PERI-WOUND to leg Apply as directed by provider. YOAN, SALLADE ([id number]) Topical: Theraderm Moisture Lotion 1 x Per Week/15 Days Discharge Instructions: apply to dry skin of leg Primary Dressing: Prisma 4.34 (in) 1 x Per Week/15 Days Discharge Instructions: Moisten w/normal saline or sterile water; Cover wound as directed. Do not remove from wound bed. Secondary Dressing: ABD Pad 5x9 (in/in) 1 x Per Week/15 Days Discharge Instructions: Cover with ABD pad Compression Wrap: Profore Lite LF 3 Multilayer Compression Bandaging System 1 x Per Week/15 Days Discharge Instructions: Apply 3 multi-layer wrap as prescribed. 1. Number recommend him going continue with the wound care measures as before and the patient is in agreement with plan. This includes the use of the silver collagen dressing. 2. We will continue with ABD pad to cover followed by 3 layer compression wrap. We will see patient back for reevaluation in 1 week here in the clinic. If anything worsens or changes patient will contact our office for additional recommendations. Electronic Signature(s) Signed: 09/14/2021 5:42:46 PM By: [name] PA-C Entered By: [name] on 09/14/2021 17:42:46 [place], [place].  ([id number]) -------------------------------------------------------------------------------- SuperBill Details Patient Name: MOSE, COLAIZZI R. Date of Service: 09/14/2021 Medical Record Number: [id number] Patient Account Number: [id number] Date of Birth/Sex: [birth year] (75 y.o. 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, including hip L97.822 Non-pressure chronic ulcer of other part of left lower leg with fat layer exposed I48.0 Paroxysmal atrial fibrillation I10 Essential (primary) hypertension Facility Procedures CPT4 Code: 23935940 Description: (Facility Use Only) [phone] - [place] LWR LT LEG Modifier: Quantity: 1 Physician Procedures CPT4 Code: 1548845 Description: 73344 - WC PHYS LEVEL 3 - EST PT Modifier: Quantity: 1 CPT4 Code: Description: ICD-10 Diagnosis Description C44.729 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

## 2021-09-21 ENCOUNTER — Encounter: Payer: Medicare PPO | Admitting: Physician Assistant

## 2021-09-21 ENCOUNTER — Other Ambulatory Visit: Payer: Self-pay

## 2021-09-21 DIAGNOSIS — I480 Paroxysmal atrial fibrillation: Secondary | ICD-10-CM | POA: Diagnosis not present

## 2021-09-21 DIAGNOSIS — L97822 Non-pressure chronic ulcer of other part of left lower leg with fat layer exposed: Secondary | ICD-10-CM | POA: Diagnosis not present

## 2021-09-21 DIAGNOSIS — Z85828 Personal history of other malignant neoplasm of skin: Secondary | ICD-10-CM | POA: Diagnosis not present

## 2021-09-21 DIAGNOSIS — I10 Essential (primary) hypertension: Secondary | ICD-10-CM | POA: Diagnosis not present

## 2021-09-21 DIAGNOSIS — T8189XA Other complications of procedures, not elsewhere classified, initial encounter: Secondary | ICD-10-CM | POA: Diagnosis not present

## 2021-09-21 NOTE — Progress Notes (Addendum)
SHAKUR, LEMBO ([id number]) Visit Report for 09/21/2021 Chief Complaint Document Details Patient Name: [name], [name] Date of Service: 09/21/2021 11:15 AM Medical Record Number: [id number] Patient Account Number: [id number] Date of Birth/Sex: 01-01-1946 (75 y.o. M) Treating RN: Donnamarie Poag Primary Care Provider: [name] Other Clinician: Referring Provider: [name] Treating Provider/Extender: Skipper Cliche in Treatment: 4 Information Obtained from: Patient Chief Complaint Left LE Ulcer Electronic Signature(s) Signed: 09/21/2021 12:14:04 PM By: [name] PA-C Entered By: [name] on 09/21/2021 12:14:03 [name] ([id number]) -------------------------------------------------------------------------------- HPI Details Patient Name: [name], [name] Date of Service: 09/21/2021 11:15 AM Medical Record Number: [id number] Patient Account Number: [id number] Date of Birth/Sex: August 11, 1946 (75 y.o. M) Treating RN: Donnamarie Poag Primary Care Provider: [name] Other Clinician: Referring Provider: [name] Treating Provider/Extender: Skipper Cliche in Treatment: 4 History of Present Illness HPI Description: 75 year old male who has a past medical history of essential hypertension, chronic atrial fibrillation, peripheral vascular disease, nonischemic cardiomyopathy,venous stasis dermatitis, gouty arthropathy, basal cell carcinoma of the right lower extremity, benign prostatic hypertrophy, long-term use of anticoagulation therapy, hyperglycemia and exercise intolerance has never been a smoker. the patient has had a vascular workup over 7 years ago and said everything was normal at that stage. He does not have any chronic problems except for cardiac issues which he sees a cardiologist in [place]. 08/15/2017 -- arterial and venous duplex studies still pending. 08/23/2017 -- venous reflux studies done on 08/13/2017 shows venous incompetence  throughout the left lower extremity deep system and focally at the left saphenofemoral junction. 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 No evidence of SVT or DVT in bilateral lower extremities The patient has an appointment at the end of the month to get his arterial duplex study done 09/05/2017 -- the patient was seen at the vein and vascular office yesterday by Angelena Form. ABI studies were notable for medial calcification and the toe brachial indices were normal and bilateral ankle-brachial) waveforms were normal with triphasic flow. After review of his venous studies he was not a candidate for laser ablation and his lymphedema was to be treated with compression stockings and lymphedema pump pumps 09/12/2017 -- had a low arterial study done at the  vein and vascular surgery -- unable to obtain reliable ABI is due to medial calcification. Bilateral toe indices were normal with the right being 1.01 and the left being 0.92 and the waveforms were triphasic bilaterally. he did get hold of 30-40 mm compression stockings but is unable to put these on. We will try and get him alternative compression stockings. 09/26/17- he is here in follow up evaluation of a right lower extremity ulcer;he is compliant in wearing compression stocking; ulcer almost epithelialized , anticipate healing next appointment Readmission: 11/17 point upon evaluation patient's wound currently that he is seeing [place] for today is a skin cancerous lesion that was cleared away by his dermatologist on the left medial calf region. He tells me that this is a very similar thing to what he had done previously in fact the last time he saw him in 2018 this was also what was going on at that point. Nonetheless he feels that based on what he seeing currently that this is just having a lot of harder time healing although it is much closer to the surface than what he is experienced in the past. He notes that the  initial removal was in June 2022 which was this year this is now  November and still has not closed. He does have some edema and definitely I think that there is some venous component to his slow healing here. Also think that we can do something better than Vaseline to try to help with getting this to clear up as quickly as possible. He does have a history of atrial fibrillation and is on Eliquis otherwise he really has no major medical problems that would affect wound healing. 09/07/2021 upon evaluation today patient actually appears to be doing significantly better after having wrapped him last week. Overall I think that this is making significant improvements at this time which is great news. I do not see any evidence of infection which is great news as well. No fevers, chills, nausea, vomiting, or diarrhea. 09/14/2021 upon evaluation today patient appears to be doing well with regard to his leg ulcer. He has been tolerating the dressing changes and overall I think that he is making excellent progress. I do not see any signs of active infection at this time. 09/21/2021 upon evaluation today patient actually appears to be making good progress with regard to his wound this is again measuring smaller today no debridement seems to be necessary. We have been using a silver collagen dressing and I think that is doing an awesome job. Electronic Signature(s) Signed: 09/21/2021 6:16:59 PM By: [name] PA-C Entered By: [name] on 09/21/2021 18:16:58 [name], [name] ([id number]) -------------------------------------------------------------------------------- Physical Exam Details Patient Name: [name], [name] Date of Service: 09/21/2021 11:15 AM Medical Record Number: [id number] Patient Account Number: [id number] Date of Birth/Sex: [birth year] (75 y.o. M) Treating RN: Donnamarie Poag Primary Care Provider: [name] Other Clinician: Referring Provider: [name] Treating  Provider/Extender: Skipper Cliche in Treatment: 4 Constitutional Well-nourished and well-hydrated in no acute distress. Respiratory normal breathing without difficulty. Psychiatric this patient is able to make decisions and demonstrates good insight into disease process. Alert and Oriented x 3. pleasant and cooperative. Notes Patient's wound bed showed signs of good granulation epithelization the only issue is he really would like for this to be moving along with a faster I completely understand. Nonetheless I believe that he is doing decently well and I think we are making headway towards getting this completely closed and just take some time. Electronic Signature(s) Signed: 09/21/2021 6:17:18 PM By: [name] PA-C Entered By: [name] on 09/21/2021 18:17:17 [name], [name] ([id number]) -------------------------------------------------------------------------------- Physician Orders Details Patient Name: [name], [name] Date of Service: 09/21/2021 11:15 AM Medical Record Number: [id number] Patient Account Number: [id number] Date of Birth/Sex: [birth year] (75 y.o. 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 o Nurse Visit as needed Bathing/ Shower/ Hygiene o May shower with wound dressing protected with water repellent cover or cast protector. o No tub bath. Anesthetic (Use 'Patient Medications' Section for Anesthetic Order Entry) o Lidocaine applied to  wound bed Edema Control - Lymphedema / Segmental Compressive Device / Other o Elevate, Exercise Daily and Avoid Standing for Long Periods of Time. o Elevate legs to the level of the heart and pump ankles as often as possible   o Elevate leg(s) parallel to the floor when sitting. Additional Orders / Instructions o Follow Nutritious Diet and Increase Protein Intake Wound Treatment Wound #3 - Lower Leg Wound Laterality: Left, Medial Cleanser: Soap and Water 1 x Per Week/15 Days Discharge Instructions: Gently cleanse wound with antibacterial soap, rinse and pat dry prior to dressing wounds Cleanser: Wound Cleanser 1 x Per Week/15 Days Discharge Instructions: Wash your hands with soap and water. Remove old dressing, discard into plastic bag and place into trash. Cleanse the wound with Wound Cleanser prior to applying a clean dressing using gauze sponges, not tissues or cotton balls. Do not scrub or use excessive force. Pat dry using gauze sponges, not tissue or cotton balls. Topical: Theraderm Moisture Lotion 1 x Per Week/15 Days Discharge Instructions: apply to dry skin of leg Primary Dressing: Prisma 4.34 (in) 1 x Per Week/15 Days Discharge Instructions: Moisten w/normal saline or sterile water; Cover wound as directed. Do not remove from wound bed. Secondary Dressing: ABD Pad 5x9 (in/in) 1 x Per Week/15 Days Discharge Instructions: Cover with ABD pad Compression Wrap: Profore Lite LF 3 Multilayer Compression Bandaging System 1 x Per Week/15 Days Discharge Instructions: Apply 3 multi-layer wrap as prescribed. Electronic Signature(s) Signed: 09/21/2021 7:26:01 PM By: [name] PA-C Signed: 09/25/2021 1:14:10 PM By: Gretta Cool BSN, RN, CWS, Kim RN, BSN [address] [place] ([id number]) Entered By: Gretta Cool, BSN, RN, CWS, Kim on 09/21/2021 12:17:19 [name], [name] ([id number]) -------------------------------------------------------------------------------- Problem List Details Patient  Name: [name], [name] Date of Service: 09/21/2021 11:15 AM Medical Record Number: [id number] Patient Account Number: [id number] Date of Birth/Sex: Oct 05, 1946 (75 y.o. 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, including hip 08/24/2021 No Yes L97.822 Non-pressure chronic ulcer of other part of left lower leg with fat layer 08/24/2021 No Yes exposed I48.0 Paroxysmal atrial fibrillation 08/24/2021 No Yes I10 Essential (primary) hypertension 08/24/2021 No Yes Inactive Problems Resolved Problems Electronic Signature(s) Signed: 09/21/2021 11:35:47 AM By: [name] PA-C Entered By: [name] on 09/21/2021 11:35:47 [name], [name] ([id number]) -------------------------------------------------------------------------------- Progress Note Details Patient Name: [name] Nevin [name] Date of Service: 09/21/2021 11:15 AM Medical Record Number: [id number] Patient Account Number: [id number] Date of Birth/Sex: [birth year] (75 y.o. M) Treating RN: Donnamarie Poag Primary Care Provider: [name] Other Clinician: Referring Provider: [name] Treating Provider/Extender: Skipper Cliche in Treatment: 4 Subjective Chief Complaint Information obtained from Patient Left LE Ulcer History of Present Illness (HPI) 75 year old male who has a past medical history of essential hypertension, chronic atrial fibrillation, peripheral vascular disease, nonischemic cardiomyopathy,venous stasis dermatitis, gouty arthropathy, basal cell carcinoma of the right lower extremity, benign prostatic hypertrophy, long- term use of anticoagulation therapy, hyperglycemia and exercise intolerance has never been a smoker. the patient has  had a vascular workup over 7 years ago and said everything was normal at that stage. He does not have any chronic problems except for cardiac issues which he sees a cardiologist in [place]. 08/15/2017 -- arterial and venous duplex studies still pending. 08/23/2017 -- venous reflux studies done on 08/13/2017 shows venous incompetence throughout the left lower extremity deep system and focally at the left saphenofemoral junction. 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 No evidence of SVT or DVT in bilateral lower extremities The patient has an appointment  at the end of the month to get his arterial duplex study done 09/05/2017 -- the patient was seen at the vein and vascular office yesterday by Angelena Form. ABI studies were notable for medial calcification and the toe brachial indices were normal and bilateral ankle-brachial) waveforms were normal with triphasic flow. After review of his venous studies he was not a candidate for laser ablation and his lymphedema was to be treated with compression stockings and lymphedema pump pumps 09/12/2017 -- had a low arterial study done at the [facility] -- unable to obtain reliable ABI is due to medial calcification. Bilateral toe indices were normal with the right being 1.01 and the left being 0.92 and the waveforms were triphasic bilaterally. he did get hold of 30-40 mm compression stockings but is unable to put these on. We will try and get him alternative compression stockings. 09/26/17- he is here in follow up evaluation of a right lower extremity ulcer;he is compliant in wearing compression stocking; ulcer almost epithelialized , anticipate healing next appointment Readmission: 11/17 point upon evaluation patient's wound currently that he is seeing [place] for today is a skin cancerous lesion that was cleared away by his dermatologist on the left medial calf region. He tells me that this is a very similar  thing to what he had done previously in fact the last time he saw him in 2018 this was also what was going on at that point. Nonetheless he feels that based on what he seeing currently that this is just having a lot of harder time healing although it is much closer to the surface than what he is experienced in the past. He notes that the initial removal was in June 2022 which was this year this is now November and still has not closed. He does have some edema and definitely I think that there is some venous component to his slow healing here. Also think that we can do something better than Vaseline to try to help with getting this to clear up as quickly as possible. He does have a history of atrial fibrillation and is on Eliquis otherwise he really has no major medical problems that would affect wound healing. 09/07/2021 upon evaluation today patient actually appears to be doing significantly better after having wrapped him last week. Overall I think that this is making significant improvements at this time which is great news. I do not see any evidence of infection which is great news as well. No fevers, chills, nausea, vomiting, or diarrhea. 09/14/2021 upon evaluation today patient appears to be doing well with regard to his leg ulcer. He has been tolerating the dressing changes and overall I think that he is making excellent progress. I do not see any signs of active infection at this time. 09/21/2021 upon evaluation today patient actually appears to be making good progress with regard to his wound this is again measuring smaller today no debridement seems to be necessary. We have been using a silver collagen dressing and I think that is doing an awesome job. Objective Constitutional Well-nourished and well-hydrated in no acute distress. [place] ([id number]) Vitals Time Taken: 11:58 AM, Height: 75 in, Weight: 245 lbs, BMI: 30.6, Temperature: 98.1 F, Pulse: 65 bpm, Respiratory Rate: 18  breaths/min, Blood Pressure: 162/88 mmHg. Respiratory normal breathing without difficulty. Psychiatric this patient is able to make decisions and demonstrates good insight into disease process. Alert and Oriented x 3. pleasant and cooperative. General Notes: Patient's wound bed showed  signs of good granulation epithelization the only issue is he really would like for this to be moving along with a faster I completely understand. Nonetheless I believe that he is doing decently well and I think we are making headway towards getting this completely closed and just take some time. Integumentary (Hair, Skin) Wound #3 status is Open. Original cause of wound was Surgical Injury. The date acquired was: 04/02/2021. The wound has been in treatment 4 weeks. The wound is located on the Left,Medial Lower Leg. The wound measures 1.2cm length x 1.6cm width x 0.1cm depth; 1.508cm^2 area and 0.151cm^3 volume. There is Fat Layer (Subcutaneous Tissue) exposed. There is no tunneling or undermining noted. There is a medium amount of serosanguineous drainage noted. There is large (67-100%) red, pink, hyper - granulation within the wound bed. There is a small (1-33%) amount of necrotic tissue within the wound bed including Adherent Slough. 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 There was a Three Layer Compression Therapy Procedure by Cornell Barman, RN. Post procedure Diagnosis Wound #3: Same as Pre-Procedure Plan Follow-up Appointments: Return Appointment in 1 week. Nurse Visit as needed Bathing/ Shower/ Hygiene: May shower with wound dressing protected with water repellent cover or cast  protector. No tub bath. Anesthetic (Use 'Patient Medications' Section for Anesthetic Order Entry): Lidocaine applied to wound bed Edema Control - Lymphedema / Segmental Compressive Device / Other: Elevate, Exercise Daily and Avoid Standing for Long Periods of Time. Elevate legs to the level of the heart and pump ankles as often as possible Elevate leg(s) parallel to the floor when sitting. Additional Orders / Instructions: Follow Nutritious Diet and Increase Protein Intake WOUND #3: - Lower Leg Wound Laterality: Left, Medial Cleanser: Soap and Water 1 x Per Week/15 Days Discharge Instructions: Gently cleanse wound with antibacterial soap, rinse and pat dry prior to dressing wounds Cleanser: Wound Cleanser 1 x Per Week/15 Days Discharge Instructions: Wash your hands with soap and water. Remove old dressing, discard into plastic bag and place into trash. Cleanse the wound with Wound Cleanser prior to applying a clean dressing using gauze sponges, not tissues or cotton balls. Do not scrub or use [name], [name] ([id number]) excessive force. Pat dry using gauze sponges, not tissue or cotton balls. Topical: Theraderm Moisture Lotion 1 x Per Week/15 Days Discharge Instructions: apply to dry skin of leg Primary Dressing: Prisma 4.34 (in) 1 x Per Week/15 Days Discharge Instructions: Moisten w/normal saline or sterile water; Cover wound as directed. Do not remove from wound bed. Secondary Dressing: ABD Pad 5x9 (in/in) 1 x Per Week/15 Days Discharge Instructions: Cover with ABD pad Compression Wrap: Profore Lite LF 3 Multilayer Compression Bandaging System 1 x Per Week/15 Days Discharge Instructions: Apply 3 multi-layer wrap as prescribed. 1. Would recommend currently that we going to continue with the wound care measures as before with the silver collagen. 2. We will also continue with the 3 layer compression wrap. 3. He should also let me know if he has any increased pain or other complications  obviously active indication of infection I see nothing right now that is a problem but we need to keep a close eye on this until we get it completely closed. We will  see patient back for reevaluation in 1 week here in the clinic. If anything worsens or changes patient will contact our office for additional recommendations. Electronic Signature(s) Signed: 09/21/2021 6:17:47 PM By: [name] PA-C Entered By: [name] on 09/21/2021 18:17:46 [name], [name] ([id number]) -------------------------------------------------------------------------------- SuperBill Details Patient Name: [name], LUCIER [name] Date of Service: 09/21/2021 Medical Record Number: [id number] Patient Account Number: [id number] Date of Birth/Sex: June 09, 1946 (75 y.o. 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, including hip L97.822 Non-pressure chronic ulcer of other part of left lower leg with fat layer exposed I48.0 Paroxysmal atrial fibrillation I10 Essential (primary) hypertension Facility Procedures CPT4 Code: [id number] Description: (Facility Use Only) [phone] - [place] JQZESP LWR LT LEG Modifier: Quantity: 1 Physician Procedures CPT4 Code: [id number] Description: 22633 - WC PHYS LEVEL 3 - EST PT Modifier: Quantity: 1 CPT4 Code: Description: ICD-10 Diagnosis Description C44.729 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

## 2021-09-25 NOTE — Progress Notes (Signed)
Wound Assessment Details Patient Name: [name], [name] Date of Service:  09/21/2021 11:15 AM Medical Record Number: [id number] Patient Account Number: [id number] Date of Birth/Sex: Jan 28, 1946 (75 y.o. M) Treating RN: Carlene Coria Primary Care Letanya Froh: [name] Other Clinician: Referring Sian Joles: [name] Treating Sylvie Mifsud/Extender: Skipper Cliche in Treatment: 4 Wound Status Wound Number: 3 Primary Etiology: Open Surgical Wound Wound Location: Left, Medial Lower Leg Wound Status: Open Wounding Event: Surgical Injury Comorbid History: Arrhythmia, Hypertension, Type II Diabetes, Gout Date Acquired: 04/02/2021 Weeks Of Treatment: 4 Clustered Wound: No Wound Measurements Length: (cm) 1.2 Width: (cm) 1.6 Depth: (cm) 0.1 Area: (cm) 1.508 Volume: (cm) 0.151 % Reduction in Area: 41.8% % Reduction in Volume: 70.8% Epithelialization: Small (1-33%) Tunneling: No Undermining: No Wound Description Classification: Full Thickness Without Exposed Support Structures Exudate Amount: Medium Exudate Type: Serosanguineous Exudate Color: red, brown Foul Odor After Cleansing: No Slough/Fibrino Yes Wound Bed Granulation Amount: Large (67-100%) Exposed Structure Granulation Quality: Red, Pink, Hyper-granulation Fascia Exposed: No Necrotic Amount: Small (1-33%) Fat Layer (Subcutaneous Tissue) Exposed: Yes Necrotic Quality: Adherent Slough Tendon Exposed: No Muscle Exposed: No Joint Exposed: No Bone Exposed: No Treatment Notes Wound #3 (Lower Leg) Wound Laterality: Left, Medial Cleanser Soap and Water Discharge Instruction: Gently cleanse wound with antibacterial soap, rinse and pat dry prior to dressing wounds Wound Cleanser Discharge Instruction: Wash your hands with soap and water. Remove old dressing, discard into plastic bag and place into trash. Cleanse the wound with Wound Cleanser prior to applying a clean dressing using gauze sponges, not tissues or cotton balls. Do not scrub or use excessive force. Pat dry using gauze sponges, not  tissue or cotton balls. Peri-Wound Care Topical Theraderm Moisture Lotion Discharge Instruction: apply to dry skin of leg Primary Dressing Prisma 4.34 (in) Discharge Instruction: Moisten w/normal saline or sterile water; Cover wound as directed. Do not remove from wound bed. Secondary Dressing ABD Pad 5x9 (in/in) [name], [name] ([id number]) Discharge Instruction: Cover with ABD pad Secured With Compression Wrap Profore Lite LF 3 Multilayer Compression Bandaging System Discharge Instruction: Apply 3 multi-layer wrap as prescribed. Compression Stockings Add-Ons Electronic Signature(s) Signed: 09/22/2021 4:49:51 PM By: Carlene Coria RN Entered By: Carlene Coria on 09/21/2021 12:00:21 [name], [name] ([id number]) -------------------------------------------------------------------------------- Vitals Details Patient Name: [name], [name] R. Date of Service: 09/21/2021 11:15 AM Medical Record Number: [id number] Patient Account Number: [id number] Date of Birth/Sex: 08-May-1946 (75 y.o. M) Treating RN: Carlene Coria Primary Care Lexi Conaty: [name] Other Clinician: Referring Nivan Melendrez: [name] Treating Larcenia Holaday/Extender: Skipper Cliche in Treatment: 4 Vital Signs Time Taken: 11:58 Temperature (F): 98.1 Height (in): 75 Pulse (bpm): 65 Weight (lbs): 245 Respiratory Rate (breaths/min): 18 Body Mass Index (BMI): 30.6 Blood Pressure (mmHg): 162/88 Reference Range: 80 - 120 mg / dl Electronic Signature(s) Signed: 09/22/2021 4:49:51 PM By: Carlene Coria RN Entered By: Carlene Coria on 09/21/2021 11:59:22  KAI, CALICO ([id number]) Visit Report for 09/21/2021 Arrival Information Details Patient Name: LAIKEN, NOHR. Date of Service: 09/21/2021 11:15 AM Medical Record Number: [id number] Patient Account Number: [id number] Date of Birth/Sex: 1945-10-22 (75 y.o. M) Treating RN: Carlene Coria Primary Care Rashon Rezek: [name] Other Clinician: Referring Willis Kuipers: [name] Treating Kendel Bessey/Extender: Skipper Cliche in Treatment: 4 Visit Information History Since Last Visit All ordered tests and consults were completed: No Patient Arrived: Ambulatory Added or deleted any medications: No Arrival Time: 11:58 Any new allergies or adverse reactions: No Accompanied By: self Had a fall or experienced change in No Transfer Assistance: None activities of daily living that may affect Patient Identification Verified: Yes risk of falls: Secondary Verification Process Completed: Yes Signs or symptoms of abuse/neglect since last visito No Patient Requires Transmission-Based No Hospitalized since last visit: No Precautions: Implantable device outside of the clinic excluding No Patient Has Alerts: Yes cellular tissue based products placed in the center Patient Alerts: Patient on Blood since last visit: Thinner Has Dressing in Place as Prescribed: Yes non compressable Has Compression in Place as Prescribed: Yes Pain Present Now: No Electronic Signature(s) Signed: 09/22/2021 4:49:51 PM By: Carlene Coria RN Entered By: Carlene Coria on 09/21/2021 11:58:50 [name], [name] ([id number]) -------------------------------------------------------------------------------- Compression Therapy Details Patient Name: [name], [name] R. Date of Service: 09/21/2021 11:15 AM Medical Record Number: [id number] Patient Account Number: [id number] Date of Birth/Sex: 1946/07/01 (75 y.o. M) Treating RN: Cornell Barman Primary Care Jeda Pardue: [name] Other Clinician: Referring Zakia Sainato: [name] Treating Orman Matsumura/Extender: Skipper Cliche in Treatment: 4 Compression Therapy Performed for Wound Assessment: Wound #3 Left,Medial Lower Leg Performed By: Clinician Cornell Barman, RN Compression Type: Three Layer Post Procedure Diagnosis Same as Pre-procedure Electronic Signature(s) Signed: 09/25/2021 1:14:10 PM By: Gretta Cool, BSN, RN, CWS, Kim RN, BSN Entered By: Gretta Cool, BSN, RN, CWS, Kim on 09/21/2021 12:16:37 [name] ([id number]) -------------------------------------------------------------------------------- Encounter Discharge Information Details Patient Name: [name], SURRATT. Date of Service: 09/21/2021 11:15 AM Medical Record Number: [id number] Patient Account Number: [id number] Date of Birth/Sex: 06/16/1946 (75 y.o. M) Treating RN: Donnamarie Poag Primary Care Vallie Fayette: [name] Other Clinician: Referring Andriea Hasegawa: [name] Treating Annlouise Gerety/Extender: Skipper Cliche in Treatment: 4 Encounter Discharge Information Items Discharge Condition: Stable Ambulatory Status: Ambulatory Discharge Destination: Home Transportation: Private Auto Accompanied By: self Schedule Follow-up Appointment: Yes Clinical Summary of Care: Electronic Signature(s) Signed: 09/21/2021 4:42:55 PM By: Donnamarie Poag Entered By: Donnamarie Poag on 09/21/2021 12:28:56 [name] ([id number]) -------------------------------------------------------------------------------- Lower Extremity Assessment Details Patient Name: [name], [name] R. Date of Service: 09/21/2021 11:15 AM Medical Record Number: [id number] Patient Account Number: [id number] Date of Birth/Sex: [birth year] (75 y.o. M) Treating RN: Carlene Coria Primary Care Lakeita Panther: [name] Other Clinician: Referring Yama Nielson: [name] Treating Zeke Aker/Extender: Skipper Cliche in Treatment: 4 Edema Assessment Assessed: Shirlyn Goltz: No] Patrice Paradise: No] Edema: [Left: Ye] [Right: s] Calf Left: Right: Point of Measurement:  40 cm From Medial Instep 36 cm Ankle Left: Right: Point of Measurement: 12 cm From Medial Instep 24 cm Vascular Assessment Pulses: Dorsalis Pedis Palpable: [Left:Yes] Electronic Signature(s) Signed: 09/22/2021 4:49:51 PM By: Carlene Coria RN Entered By: Carlene Coria on 09/21/2021 12:00:59 [name] ([id number]) -------------------------------------------------------------------------------- Multi Wound Chart Details Patient Name: [name] Nevin R. Date of Service: 09/21/2021 11:15 AM Medical Record Number: [id number] Patient Account Number: [id number] Date of Birth/Sex: Mar 02, 1946 (75 y.o. M) Treating RN: Cornell Barman Primary Care Saffron Busey: [name] Other Clinician: Referring Kellan Raffield: [name] Treating Denman Pichardo/Extender: Skipper Cliche in Treatment: 4  Wound Assessment Details Patient Name: [name], [name] Date of Service:  09/21/2021 11:15 AM Medical Record Number: [id number] Patient Account Number: [id number] Date of Birth/Sex: Jan 28, 1946 (75 y.o. M) Treating RN: Carlene Coria Primary Care Letanya Froh: [name] Other Clinician: Referring Sian Joles: [name] Treating Sylvie Mifsud/Extender: Skipper Cliche in Treatment: 4 Wound Status Wound Number: 3 Primary Etiology: Open Surgical Wound Wound Location: Left, Medial Lower Leg Wound Status: Open Wounding Event: Surgical Injury Comorbid History: Arrhythmia, Hypertension, Type II Diabetes, Gout Date Acquired: 04/02/2021 Weeks Of Treatment: 4 Clustered Wound: No Wound Measurements Length: (cm) 1.2 Width: (cm) 1.6 Depth: (cm) 0.1 Area: (cm) 1.508 Volume: (cm) 0.151 % Reduction in Area: 41.8% % Reduction in Volume: 70.8% Epithelialization: Small (1-33%) Tunneling: No Undermining: No Wound Description Classification: Full Thickness Without Exposed Support Structures Exudate Amount: Medium Exudate Type: Serosanguineous Exudate Color: red, brown Foul Odor After Cleansing: No Slough/Fibrino Yes Wound Bed Granulation Amount: Large (67-100%) Exposed Structure Granulation Quality: Red, Pink, Hyper-granulation Fascia Exposed: No Necrotic Amount: Small (1-33%) Fat Layer (Subcutaneous Tissue) Exposed: Yes Necrotic Quality: Adherent Slough Tendon Exposed: No Muscle Exposed: No Joint Exposed: No Bone Exposed: No Treatment Notes Wound #3 (Lower Leg) Wound Laterality: Left, Medial Cleanser Soap and Water Discharge Instruction: Gently cleanse wound with antibacterial soap, rinse and pat dry prior to dressing wounds Wound Cleanser Discharge Instruction: Wash your hands with soap and water. Remove old dressing, discard into plastic bag and place into trash. Cleanse the wound with Wound Cleanser prior to applying a clean dressing using gauze sponges, not tissues or cotton balls. Do not scrub or use excessive force. Pat dry using gauze sponges, not  tissue or cotton balls. Peri-Wound Care Topical Theraderm Moisture Lotion Discharge Instruction: apply to dry skin of leg Primary Dressing Prisma 4.34 (in) Discharge Instruction: Moisten w/normal saline or sterile water; Cover wound as directed. Do not remove from wound bed. Secondary Dressing ABD Pad 5x9 (in/in) [name], [name] ([id number]) Discharge Instruction: Cover with ABD pad Secured With Compression Wrap Profore Lite LF 3 Multilayer Compression Bandaging System Discharge Instruction: Apply 3 multi-layer wrap as prescribed. Compression Stockings Add-Ons Electronic Signature(s) Signed: 09/22/2021 4:49:51 PM By: Carlene Coria RN Entered By: Carlene Coria on 09/21/2021 12:00:21 [name], [name] ([id number]) -------------------------------------------------------------------------------- Vitals Details Patient Name: [name], [name] R. Date of Service: 09/21/2021 11:15 AM Medical Record Number: [id number] Patient Account Number: [id number] Date of Birth/Sex: 08-May-1946 (75 y.o. M) Treating RN: Carlene Coria Primary Care Lexi Conaty: [name] Other Clinician: Referring Nivan Melendrez: [name] Treating Larcenia Holaday/Extender: Skipper Cliche in Treatment: 4 Vital Signs Time Taken: 11:58 Temperature (F): 98.1 Height (in): 75 Pulse (bpm): 65 Weight (lbs): 245 Respiratory Rate (breaths/min): 18 Body Mass Index (BMI): 30.6 Blood Pressure (mmHg): 162/88 Reference Range: 80 - 120 mg / dl Electronic Signature(s) Signed: 09/22/2021 4:49:51 PM By: Carlene Coria RN Entered By: Carlene Coria on 09/21/2021 11:59:22

## 2021-09-28 ENCOUNTER — Other Ambulatory Visit: Payer: Self-pay

## 2021-09-28 ENCOUNTER — Encounter: Payer: Medicare PPO | Admitting: Physician Assistant

## 2021-09-28 DIAGNOSIS — Z85828 Personal history of other malignant neoplasm of skin: Secondary | ICD-10-CM | POA: Diagnosis not present

## 2021-09-28 DIAGNOSIS — L97822 Non-pressure chronic ulcer of other part of left lower leg with fat layer exposed: Secondary | ICD-10-CM | POA: Diagnosis not present

## 2021-09-28 DIAGNOSIS — I10 Essential (primary) hypertension: Secondary | ICD-10-CM | POA: Diagnosis not present

## 2021-09-28 DIAGNOSIS — I480 Paroxysmal atrial fibrillation: Secondary | ICD-10-CM | POA: Diagnosis not present

## 2021-09-28 DIAGNOSIS — T8189XA Other complications of procedures, not elsewhere classified, initial encounter: Secondary | ICD-10-CM | POA: Diagnosis not present

## 2021-09-28 NOTE — Progress Notes (Addendum)
BRAISON, SNOKE ([id number]) Visit Report for 09/28/2021 Chief Complaint Document Details Patient Name: [name], [name] Date of Service: 09/28/2021 11:15 AM Medical Record Number: [id number] Patient Account Number: [id number] Date of Birth/Sex: 1946/05/21 (75 y.o. M) Treating RN: Donnamarie Poag Primary Care Provider: [name] Other Clinician: Referring Provider: [name] Treating Provider/Extender: Skipper Cliche in Treatment: 5 Information Obtained from: Patient Chief Complaint Left LE Ulcer Electronic Signature(s) Signed: 09/28/2021 11:39:44 AM By: [name] PA-C Entered By: [name] on 09/28/2021 11:39:44 Hilgers, Marlou Starks ([id number]) -------------------------------------------------------------------------------- HPI Details Patient Name: [name] Date of Service: 09/28/2021 11:15 AM Medical Record Number: [id number] Patient Account Number: [id number] Date of Birth/Sex: 1945/11/16 (75 y.o. M) Treating RN: Donnamarie Poag Primary Care Provider: [name] Other Clinician: Referring Provider: [name] Treating Provider/Extender: Skipper Cliche in Treatment: 5 History of Present Illness HPI Description: 75 year old male who has a past medical history of essential hypertension, chronic atrial fibrillation, peripheral vascular disease, nonischemic cardiomyopathy,venous stasis dermatitis, gouty arthropathy, basal cell carcinoma of the right lower extremity, benign prostatic hypertrophy, long-term use of anticoagulation therapy, hyperglycemia and exercise intolerance has never been a smoker. the patient has had a vascular workup over 7 years ago and said everything was normal at that stage. He does not have any chronic problems except for cardiac issues which he sees a cardiologist in [place]. 08/15/2017 -- arterial and venous duplex studies still pending. 08/23/2017 -- venous reflux studies done on 08/13/2017 shows venous incompetence  throughout the left lower extremity deep system and focally at the left saphenofemoral junction. 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 No evidence of SVT or DVT in bilateral lower extremities The patient has an appointment at the end of the month to get his arterial duplex study done 09/05/2017 -- the patient was seen at the vein and vascular office yesterday by Angelena Form. ABI studies were notable for medial calcification and the toe brachial indices were normal and bilateral ankle-brachial) waveforms were normal with triphasic flow. After review of his venous studies he was not a candidate for laser ablation and his lymphedema was to be treated with compression stockings and lymphedema pump pumps 09/12/2017 -- had a low arterial study done at the [facility] -- unable to obtain reliable ABI is due to medial calcification. Bilateral toe indices were normal with the right being 1.01 and the left being 0.92 and the waveforms were triphasic bilaterally. he did get hold of 30-40 mm compression stockings but is unable to put these on. We will try and get him alternative compression stockings. 09/26/17- he is here in follow up evaluation of a right lower extremity ulcer;he is compliant in wearing compression stocking; ulcer almost epithelialized , anticipate healing next appointment Readmission: 11/17 point upon evaluation patient's wound currently that he is seeing [place] for today is a skin cancerous lesion that was cleared away by his dermatologist on the left medial calf region. He tells me that this is a very similar thing to what he had done previously in fact the last time he saw him in 2018 this was also what was going on at that point. Nonetheless he feels that based on what he seeing currently that this is just having a lot of harder time healing although it is much closer to the surface than what he is experienced in the past. He notes that the  initial removal was in June 2022 which was this year this is now  November and still has not closed. He does have some edema and definitely I think that there is some venous component to his slow healing here. Also think that we can do something better than Vaseline to try to help with getting this to clear up as quickly as possible. He does have a history of atrial fibrillation and is on Eliquis otherwise he really has no major medical problems that would affect wound healing. 09/07/2021 upon evaluation today patient actually appears to be doing significantly better after having wrapped him last week. Overall I think that this is making significant improvements at this time which is great news. I do not see any evidence of infection which is great news as well. No fevers, chills, nausea, vomiting, or diarrhea. 09/14/2021 upon evaluation today patient appears to be doing well with regard to his leg ulcer. He has been tolerating the dressing changes and overall I think that he is making excellent progress. I do not see any signs of active infection at this time. 09/21/2021 upon evaluation today patient actually appears to be making good progress with regard to his wound this is again measuring smaller today no debridement seems to be necessary. We have been using a silver collagen dressing and I think that is doing an awesome job. 09/28/2021 upon evaluation today patient appears to be doing well with regard to his leg currently. I do not see any signs of active infection at this time which is great news. No fevers, chills, nausea, vomiting, or diarrhea. I think this wound is very close to complete resolution. Electronic Signature(s) Signed: 09/28/2021 5:51:55 PM By: [name] PA-C Entered By: [name] on 09/28/2021 17:51:54 Pietrzyk, Jennings RMarland Kitchen ([id number]) -------------------------------------------------------------------------------- Physical Exam Details Patient Name: [name], DWAN  R. Date of Service: 09/28/2021 11:15 AM Medical Record Number: [id number] Patient Account Number: [id number] Date of Birth/Sex: 09/21/1946 (75 y.o. M) Treating RN: Donnamarie Poag Primary Care Provider: [name] Other Clinician: Referring Provider: [name] Treating Provider/Extender: Skipper Cliche in Treatment: 5 Constitutional Well-nourished and well-hydrated in no acute distress. Respiratory normal breathing without difficulty. Psychiatric this patient is able to make decisions and demonstrates good insight into disease process. Alert and Oriented x 3. pleasant and cooperative. Notes Upon inspection patient's wound bed showed signs of good granulation epithelization at this point. I see very little that appears to be even open at all and I think were very close to being able to discontinue wound care services altogether. Electronic Signature(s) Signed: 09/28/2021 5:52:08 PM By: [name] PA-C Entered By: [name] on 09/28/2021 17:52:08 DESMEN, SCHOFFSTALL ([id number]) -------------------------------------------------------------------------------- Physician Orders Details Patient Name: RAWLEY, HARJU. Date of Service: 09/28/2021 11:15 AM Medical Record Number: [id number] Patient Account Number: [id number] Date of Birth/Sex: 09-12-1946 (75 y.o. 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 - provider week of 1/3 o Nurse Visit as needed - week of  12/26 Bathing/ Shower/ Hygiene o May shower with wound dressing protected with water repellent cover or cast protector. o No tub bath. Anesthetic (Use 'Patient Medications' Section for Anesthetic Order Entry)   o Lidocaine applied to wound bed Edema Control - Lymphedema / Segmental Compressive Device / Other o Elevate, Exercise Daily and Avoid Standing for Long Periods of Time. o Elevate legs to the level of the heart and pump ankles as often as possible o Elevate leg(s) parallel to the floor when sitting. Additional Orders / Instructions o Follow Nutritious Diet and Increase Protein Intake Wound Treatment Wound #3 - Lower Leg Wound Laterality: Left, Medial Cleanser: Soap and Water 1 x Per Week/15 Days Discharge Instructions: Gently cleanse wound with antibacterial soap, rinse and pat dry prior to dressing wounds Cleanser: Wound Cleanser 1 x Per Week/15 Days Discharge Instructions: Wash your hands with soap and water. Remove old dressing, discard into plastic bag and place into trash. Cleanse the wound with Wound Cleanser prior to applying a clean dressing using gauze sponges, not tissues or cotton balls. Do not scrub or use excessive force. Pat dry using gauze sponges, not tissue or cotton balls. Topical: Theraderm Moisture Lotion 1 x Per Week/15 Days Discharge Instructions: apply to dry skin of leg Primary Dressing: [employer] Oil Emulsion Dressings, 3x8 (in/in) 1 x Per Week/15 Days Secondary Dressing: Gauze 1 x Per Week/15 Days Compression Wrap: Profore Lite LF 3 Multilayer Compression Bandaging System 1 x Per Week/15 Days Discharge Instructions: Apply 3 multi-layer wrap as prescribed. Electronic Signature(s) Signed: 09/28/2021 3:38:35 PM By: Donnamarie Poag Signed: 09/28/2021 6:19:38 PM By: [name] PA-C Entered By: Donnamarie Poag on 09/28/2021 12:21:26 AUNDREA, HIGGINBOTHAM ([id number], BARTNICK  ([id number]) -------------------------------------------------------------------------------- Problem List Details Patient Name: IRIS, TATSCH. Date of Service: 09/28/2021 11:15 AM Medical Record Number: [id number] Patient Account Number: [id number] Date of Birth/Sex: Nov 01, 1945 (75 y.o. 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 Date of Service: 09/28/2021 11:15 AM Medical Record Number: [id number] Patient Account Number: [id number] Date of Birth/Sex: 09/17/1946 (75 y.o. M) Treating RN: Donnamarie Poag Primary Care Provider: [name] Other Clinician: Referring Provider: [name] Treating Provider/Extender: Skipper Cliche in Treatment: 5 Subjective Chief Complaint Information obtained from Patient Left LE Ulcer History of Present Illness (HPI) 74 year old male who has a past medical history of essential hypertension, chronic atrial fibrillation, peripheral vascular disease, nonischemic cardiomyopathy,venous stasis dermatitis, gouty arthropathy, basal cell carcinoma of the right lower extremity, benign prostatic hypertrophy,  long- term use of anticoagulation therapy, hyperglycemia and exercise intolerance has never been a smoker. the patient has had a vascular workup over 7 years ago and said everything was normal at that stage. He does not have any chronic problems except for cardiac issues which he sees a cardiologist in [place]. 08/15/2017 -- arterial and venous duplex studies still pending. 08/23/2017 -- venous reflux studies done on 08/13/2017 shows venous incompetence throughout the left lower extremity deep system and focally at the left saphenofemoral junction. 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 No evidence of  SVT or DVT in bilateral lower extremities The patient has an appointment at the end of the month to get his arterial duplex study done 09/05/2017 -- the patient was seen at the vein and vascular office yesterday by Angelena Form. ABI studies were notable for medial calcification and the toe brachial indices were normal and bilateral ankle-brachial) waveforms were normal with triphasic flow. After review of his venous studies he was not a candidate for laser ablation and his lymphedema was to be treated with compression stockings and lymphedema pump pumps 09/12/2017 -- had a low arterial study done at the [facility] -- unable to obtain reliable ABI is due to medial calcification. Bilateral toe indices were normal with the right being 1.01 and the left being 0.92 and the waveforms were triphasic bilaterally. he did get hold of 30-40 mm compression stockings but is unable to put these on. We will try and get him alternative compression stockings. 09/26/17- he is here in follow up evaluation of a right lower extremity ulcer;he is compliant in wearing compression stocking; ulcer almost epithelialized , anticipate healing next appointment Readmission: 11/17 point upon evaluation patient's wound currently that he is seeing [place] for today is a skin cancerous  lesion that was cleared away by his dermatologist on the left medial calf region. He tells me that this is a very similar thing to what he had done previously in fact the last time he saw him in 2018 this was also what was going on at that point. Nonetheless he feels that based on what he seeing currently that this is just having a lot of harder time healing although it is much closer to the surface than what he is experienced in the past. He notes that the initial removal was in June 2022 which was this year this is now November and still has not closed. He does have some edema and definitely I think that there is some venous component to his slow healing here. Also think that we can do something better than Vaseline to try to help with getting this to clear up as quickly as possible. He does have a history of atrial fibrillation and is on Eliquis otherwise he really has no major medical problems that would affect wound healing. 09/07/2021 upon evaluation today patient actually appears to be doing significantly better after having wrapped him last week. Overall I think that this is making significant improvements at this time which is great news. I do not see any evidence of infection which is great news as well. No fevers, chills, nausea, vomiting, or diarrhea. 09/14/2021 upon evaluation today patient appears to be doing well with regard to his leg ulcer. He has been tolerating the dressing changes and overall I think that he is making excellent progress. I do not see any signs of active infection at this time. 09/21/2021 upon evaluation today patient actually appears to be making good progress with regard to his wound this is again measuring smaller today no debridement seems to be necessary. We have been using a silver collagen dressing and I think that is doing an awesome job. 09/28/2021 upon evaluation today patient appears to be doing well with regard to his leg currently. I do not see any signs  of active infection at this time which is great news. No fevers, chills, nausea, vomiting, or diarrhea. I think this wound is very close to complete resolution. Objective SALEM, LEMBKE R. ([id number]) Constitutional Well-nourished and well-hydrated in no acute distress.  Vitals Time Taken: 12:02 PM, Height: 75 in, Weight: 245 lbs, BMI: 30.6, Temperature: 98.2 F, Pulse: 70 bpm, Respiratory Rate: 16 breaths/min, Blood Pressure: 169/75 mmHg. Respiratory normal breathing without difficulty. Psychiatric this patient is able to make decisions and demonstrates good insight into disease process. Alert and Oriented x 3. pleasant and cooperative. General Notes: Upon inspection patient's wound bed showed signs of good granulation epithelization at this point. I see very little that appears to be even open at all and I think were very close to being able to discontinue wound care services altogether. Integumentary (Hair, Skin) Wound #3 status is Open. Original cause of wound was Surgical Injury. The date acquired was: 04/02/2021. The wound has been in treatment 5 weeks. The wound is located on the Left,Medial Lower Leg. The wound measures 0.6cm length x 0.7cm width x 0.1cm depth; 0.33cm^2 area and 0.033cm^3 volume. There is Fat Layer (Subcutaneous Tissue) exposed. There is no tunneling or undermining noted. There is a medium amount of serosanguineous drainage noted. There is large (67-100%) red, pink, hyper - granulation within the wound bed. There is a small (1-33%) amount of necrotic tissue within the wound bed including Adherent Slough. 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  There was a Three Layer Compression Therapy Procedure by Donnamarie Poag, RN. Post procedure Diagnosis Wound #3: Same as Pre-Procedure Plan Follow-up Appointments: Return Appointment in 1 week. - provider week of 1/3 Nurse Visit as needed - week of 12/26 Bathing/ Shower/ Hygiene: May shower with wound dressing protected with water repellent cover or cast protector. No tub bath. Anesthetic (Use 'Patient Medications' Section for Anesthetic Order Entry): Lidocaine applied to wound bed Edema Control - Lymphedema / Segmental Compressive Device / Other: Elevate, Exercise Daily and Avoid Standing for Long Periods of Time. Elevate legs to the level of the heart and pump ankles as often as possible Elevate leg(s) parallel to the floor when sitting. Additional Orders / Instructions: Follow Nutritious Diet and Increase Protein Intake WOUND #3: - Lower Leg Wound Laterality: Left, Medial Cleanser: Soap and Water 1 x Per Week/15 Days Discharge Instructions: Gently cleanse wound with antibacterial soap, rinse and pat dry prior to dressing wounds Cleanser: Wound Cleanser 1 x Per [address] LEXANDER, TREMBLAY ([id number]) Discharge Instructions: Wash your hands with soap and water. Remove old dressing, discard into plastic bag and place into trash. Cleanse the wound with Wound Cleanser prior to applying a clean dressing using gauze sponges, not tissues or cotton balls. Do not scrub or use excessive force. Pat dry using gauze sponges, not tissue or cotton balls. Topical: Theraderm Moisture Lotion 1 x Per Week/15 Days Discharge Instructions: apply to dry skin of leg Primary Dressing: [facility] Oil Emulsion Dressings, 3x8 (in/in) 1 x Per Week/15 Days Secondary Dressing: Gauze 1 x Per Week/15 Days Compression Wrap: Profore Lite LF 3 Multilayer Compression Bandaging System 1 x Per Week/15 Days Discharge Instructions: Apply 3 multi-layer wrap as prescribed. 1. Would recommend that we go ahead and continue  with the wound care measures as before the patient is in agreement with plan. This includes the use of the compression wrap which I think has been beneficial for him this is a 3 layer compression wrap. 2.  I am also can recommend that we have the patient continue with a oil emulsion dressing over top of the wound to help prevent it from sticking as again this is looking to be healed he just needs protection while we continue compression. We will see patient back for reevaluation in 1 week here in the clinic. If anything worsens or changes patient will contact our office for additional recommendations. This will be a nurse visit we will see him in 2 weeks for provider visit. Electronic Signature(s) Signed: 09/28/2021 5:52:50 PM By: [name] PA-C Entered By: [name] on 09/28/2021 17:52:49 Donaghue, Moville R. ([id number]) -------------------------------------------------------------------------------- SuperBill Details Patient Name: AMEET, SANDY. Date of Service: 09/28/2021 Medical Record Number: [id number] Patient Account Number: [id number] Date of Birth/Sex: 1946-03-19 (75 y.o. 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, including hip L97.822 Non-pressure chronic ulcer of other part of left lower leg with fat layer exposed I48.0 Paroxysmal atrial fibrillation I10 Essential (primary) hypertension Facility Procedures CPT4 Code: 13244010 Description: (Facility Use Only) [phone] - [facility] LWR LT LEG Modifier: Quantity: 1 Physician Procedures CPT4 Code: 4403474 Description: 25956 - WC PHYS LEVEL 3 - EST PT Modifier: Quantity: 1 CPT4 Code: Description: ICD-10 Diagnosis Description C44.729 Squamous cell carcinoma of skin  of left lower limb, including hip L97.822 Non-pressure chronic ulcer of other part of left lower leg with fat lay I48.0 Paroxysmal atrial fibrillation I10 Essential  (primary) hypertension Modifier: er exposed Quantity: Electronic Signature(s) Signed: 09/28/2021 5:54:53 PM By: [name] PA-C Previous Signature: 09/28/2021 3:38:35 PM Version By: Donnamarie Poag Entered By: [name] on 09/28/2021 17:54:53

## 2021-09-28 NOTE — Progress Notes (Addendum)
[name] ([id number]) Visit Report for 09/28/2021 Arrival Information Details Patient Name: [name], [name] Date of Service: 09/28/2021 11:15 AM Medical Record Number: [id number] Patient Account Number: [id number] Date of Birth/Sex: 1945-10-22 (75 y.o. M) Treating RN: [name] Primary Care Jevaughn Degollado: [name] Other Clinician: Referring Kymoni Lesperance: [name] Treating Adynn Caseres/Extender: Skipper Cliche in Treatment: 5 Visit Information History Since Last Visit Added or deleted any medications: No Patient Arrived: Ambulatory Had a fall or experienced change in No Arrival Time: 11:58 activities of daily living that may affect Accompanied By: self risk of falls: Transfer Assistance: None Hospitalized since last visit: No Patient Identification Verified: Yes Has Dressing in Place as Prescribed: Yes Secondary Verification Process Completed: Yes Pain Present Now: No Patient Requires Transmission-Based No Precautions: Patient Has Alerts: Yes Patient Alerts: Patient on Blood Thinner non compressable Electronic Signature(s) Signed: 09/28/2021 3:38:35 PM By: [name] Entered By: [name] on 09/28/2021 12:00:22 [name] ([id number]) -------------------------------------------------------------------------------- Compression Therapy Details Patient Name: [name] Date of Service: 09/28/2021 11:15 AM Medical Record Number: [id number] Patient Account Number: [id number] Date of Birth/Sex: March 14, 1946 (75 y.o. M) Treating RN: [name] Primary Care Jozsef Wescoat: [name] Other Clinician: Referring Alisha Bacus: [name] Treating Caralynn Gelber/Extender: Skipper Cliche in Treatment: 5 Compression Therapy Performed for Wound Assessment: Wound #3 Left,Medial Lower Leg Performed By: Clinician [name] Poag, RN Compression Type: Three Layer Post Procedure Diagnosis Same as Pre-procedure Electronic Signature(s) Signed: 09/28/2021 3:38:35 PM By:  [name] Entered By: [name] on 09/28/2021 12:19:44 [name] ([id number]) -------------------------------------------------------------------------------- Encounter Discharge Information Details Patient Name: [name], [name] Date of Service: 09/28/2021 11:15 AM Medical Record Number: [id number] Patient Account Number: [id number] Date of Birth/Sex: [birth year] (75 y.o. M) Treating RN: [name] Primary Care Dionta Larke: [name] Other Clinician: Referring Aariah Godette: [name] Treating Analena Gama/Extender: Skipper Cliche in Treatment: 5 Encounter Discharge Information Items Discharge Condition: Stable Ambulatory Status: Ambulatory Discharge Destination: Home Transportation: Private Auto Accompanied By: self Schedule Follow-up Appointment: Yes Clinical Summary of Care: Electronic Signature(s) Signed: 09/28/2021 3:38:35 PM By: [name] Entered By: [name] on 09/28/2021 12:22:23 [name] ([id number]) -------------------------------------------------------------------------------- Lower Extremity Assessment Details Patient Name: [name], [name] Date of Service: 09/28/2021 11:15 AM Medical Record Number: [id number] Patient Account Number: [id number] Date of Birth/Sex: [birth year] (75 y.o. M) Treating RN: [name] Primary Care [name]: [name] Other Clinician: Referring [name]: [name] Treating Neizan Debruhl/Extender: Skipper Cliche in Treatment: 5 Edema Assessment Assessed: [name]: No] [name]: No] [Left: Edema] [Right: :] Calf Left: Right: Point of Measurement: 40 cm From Medial Instep 36.5 cm Ankle Left: Right: Point of Measurement: 12 cm From Medial Instep 23.5 cm Vascular Assessment Pulses: Dorsalis Pedis Palpable: [Left:Yes] Electronic Signature(s) Signed: 09/28/2021 3:38:35 PM By: [name] Entered By: [name] on 09/28/2021 12:10:51 [name]  ([id number]) -------------------------------------------------------------------------------- Multi Wound Chart Details Patient Name: [name] Date of Service: 09/28/2021 11:15 AM Medical Record Number: [id number] Patient Account Number: [id number] Date of Birth/Sex: 10-Apr-1946 (75 y.o. M) Treating RN: [name] Primary Care [name]: [name] Other Clinician: Referring [name]: [name] Treating [name]/Extender: Skipper Cliche in Treatment: 5 Vital Signs Height(in): 75 Pulse(bpm): 50 Weight(lbs): 245 Blood Pressure(mmHg): 169/75 Body Mass Index(BMI): 31 Temperature(F): 98.2 Respiratory Rate(breaths/min): 16 Photos: [[name]:[name]] Wound Location: Left, Medial Lower Leg [name] [name] Wounding Event: Surgical Injury [name] [name] Primary Etiology: Open Surgical Wound [name] [name] Comorbid History: Arrhythmia, Hypertension, Type II [name] [name] Diabetes, Gout Date Acquired: 04/02/2021 [name] [name] Weeks of Treatment:  [name] ([id number]) Visit Report for 09/28/2021 Arrival Information Details Patient Name: [name], [name] Date of Service: 09/28/2021 11:15 AM Medical Record Number: [id number] Patient Account Number: [id number] Date of Birth/Sex: 1945-10-22 (75 y.o. M) Treating RN: [name] Primary Care Jevaughn Degollado: [name] Other Clinician: Referring Kymoni Lesperance: [name] Treating Adynn Caseres/Extender: Skipper Cliche in Treatment: 5 Visit Information History Since Last Visit Added or deleted any medications: No Patient Arrived: Ambulatory Had a fall or experienced change in No Arrival Time: 11:58 activities of daily living that may affect Accompanied By: self risk of falls: Transfer Assistance: None Hospitalized since last visit: No Patient Identification Verified: Yes Has Dressing in Place as Prescribed: Yes Secondary Verification Process Completed: Yes Pain Present Now: No Patient Requires Transmission-Based No Precautions: Patient Has Alerts: Yes Patient Alerts: Patient on Blood Thinner non compressable Electronic Signature(s) Signed: 09/28/2021 3:38:35 PM By: [name] Entered By: [name] on 09/28/2021 12:00:22 [name] ([id number]) -------------------------------------------------------------------------------- Compression Therapy Details Patient Name: [name] Date of Service: 09/28/2021 11:15 AM Medical Record Number: [id number] Patient Account Number: [id number] Date of Birth/Sex: March 14, 1946 (75 y.o. M) Treating RN: [name] Primary Care Jozsef Wescoat: [name] Other Clinician: Referring Alisha Bacus: [name] Treating Caralynn Gelber/Extender: Skipper Cliche in Treatment: 5 Compression Therapy Performed for Wound Assessment: Wound #3 Left,Medial Lower Leg Performed By: Clinician [name] Poag, RN Compression Type: Three Layer Post Procedure Diagnosis Same as Pre-procedure Electronic Signature(s) Signed: 09/28/2021 3:38:35 PM By:  [name] Entered By: [name] on 09/28/2021 12:19:44 [name] ([id number]) -------------------------------------------------------------------------------- Encounter Discharge Information Details Patient Name: [name], [name] Date of Service: 09/28/2021 11:15 AM Medical Record Number: [id number] Patient Account Number: [id number] Date of Birth/Sex: [birth year] (75 y.o. M) Treating RN: [name] Primary Care Dionta Larke: [name] Other Clinician: Referring Aariah Godette: [name] Treating Analena Gama/Extender: Skipper Cliche in Treatment: 5 Encounter Discharge Information Items Discharge Condition: Stable Ambulatory Status: Ambulatory Discharge Destination: Home Transportation: Private Auto Accompanied By: self Schedule Follow-up Appointment: Yes Clinical Summary of Care: Electronic Signature(s) Signed: 09/28/2021 3:38:35 PM By: [name] Entered By: [name] on 09/28/2021 12:22:23 [name] ([id number]) -------------------------------------------------------------------------------- Lower Extremity Assessment Details Patient Name: [name], [name] Date of Service: 09/28/2021 11:15 AM Medical Record Number: [id number] Patient Account Number: [id number] Date of Birth/Sex: [birth year] (75 y.o. M) Treating RN: [name] Primary Care [name]: [name] Other Clinician: Referring [name]: [name] Treating Neizan Debruhl/Extender: Skipper Cliche in Treatment: 5 Edema Assessment Assessed: [name]: No] [name]: No] [Left: Edema] [Right: :] Calf Left: Right: Point of Measurement: 40 cm From Medial Instep 36.5 cm Ankle Left: Right: Point of Measurement: 12 cm From Medial Instep 23.5 cm Vascular Assessment Pulses: Dorsalis Pedis Palpable: [Left:Yes] Electronic Signature(s) Signed: 09/28/2021 3:38:35 PM By: [name] Entered By: [name] on 09/28/2021 12:10:51 [name]  ([id number]) -------------------------------------------------------------------------------- Multi Wound Chart Details Patient Name: [name] Date of Service: 09/28/2021 11:15 AM Medical Record Number: [id number] Patient Account Number: [id number] Date of Birth/Sex: 10-Apr-1946 (75 y.o. M) Treating RN: [name] Primary Care [name]: [name] Other Clinician: Referring [name]: [name] Treating [name]/Extender: Skipper Cliche in Treatment: 5 Vital Signs Height(in): 75 Pulse(bpm): 50 Weight(lbs): 245 Blood Pressure(mmHg): 169/75 Body Mass Index(BMI): 31 Temperature(F): 98.2 Respiratory Rate(breaths/min): 16 Photos: [[name]:[name]] Wound Location: Left, Medial Lower Leg [name] [name] Wounding Event: Surgical Injury [name] [name] Primary Etiology: Open Surgical Wound [name] [name] Comorbid History: Arrhythmia, Hypertension, Type II [name] [name] Diabetes, Gout Date Acquired: 04/02/2021 [name] [name] Weeks of Treatment:  Etiology: Open Surgical Wound Wound Location: Left, Medial Lower Leg Wound Status: Open Wounding Event: Surgical Injury Comorbid History: Arrhythmia, Hypertension,  Type II Diabetes, Gout Date Acquired: 04/02/2021 Weeks Of Treatment: 5 Clustered Wound: No Photos Wound Measurements Length: (cm) 0.6 Width: (cm) 0.7 Depth: (cm) 0.1 Area: (cm) 0.33 Volume: (cm) 0.033 % Reduction in Area: 87.3% % Reduction in Volume: 93.6% Epithelialization: Small (1-33%) Tunneling: No Undermining: No Wound Description Classification: Full Thickness Without Exposed Support Structures Exudate Amount: Medium Exudate Type: Serosanguineous Exudate Color: red, brown Foul Odor After Cleansing: No Slough/Fibrino Yes Wound Bed Granulation Amount: Large (67-100%) Exposed Structure Granulation Quality: Red, Pink, Hyper-granulation Fascia Exposed: No Necrotic Amount: Small (1-33%) Fat Layer (Subcutaneous Tissue) Exposed: Yes Necrotic Quality: Adherent Slough Tendon Exposed: No Muscle Exposed: No Joint Exposed: No Bone Exposed: No Treatment Notes Wound #3 (Lower Leg) Wound Laterality: Left, Medial Cleanser Soap and Water Discharge Instruction: Gently cleanse wound with antibacterial soap, rinse and pat dry prior to dressing wounds Wound Cleanser Discharge Instruction: Wash your hands with soap and water. Remove old dressing, discard into plastic bag and place into trash. Cleanse the wound with Wound Cleanser prior to applying a clean dressing using gauze sponges, not tissues or cotton balls. Do not TEOFIL, MANIACI [name] ([id number]) scrub or use excessive force. Pat dry using gauze sponges, not tissue or cotton balls. Peri-Wound Care Topical Theraderm Moisture Lotion Discharge Instruction: apply to dry skin of leg Primary Dressing [facility] Oil Emulsion Dressings, 3x8 (in/in) Secondary Dressing Gauze Secured With Compression Wrap Profore Lite LF 3 Multilayer Compression Bandaging System Discharge Instruction: Apply 3 multi-layer wrap as prescribed. Compression Stockings Add-Ons Electronic Signature(s) Signed: 09/28/2021 3:38:35 PM By: [name] Entered  By: [name] on 09/28/2021 12:08:49 [name] ([id number]) -------------------------------------------------------------------------------- [place] Details Patient Name: [name] Date of Service: 09/28/2021 11:15 AM Medical Record Number: [id number] Patient Account Number: [id number] Date of Birth/Sex: [birth year] (75 y.o. M) Treating RN: [name] Primary Care Andrewjames Weirauch: [name] Other Clinician: Referring Hartley Wyke: [name] Treating Zale Marcotte/Extender: Skipper Cliche in Treatment: 5 Vital Signs Time Taken: 12:02 Temperature (F): 98.2 Height (in): 75 Pulse (bpm): 70 Weight (lbs): 245 Respiratory Rate (breaths/min): 16 Body Mass Index (BMI): 30.6 Blood Pressure (mmHg): 169/75 Reference Range: 80 - 120 mg / dl Electronic Signature(s) Signed: 09/28/2021 3:38:35 PM By: [name] Entered ByDonnamarie [name] on 09/28/2021 12:04:12

## 2021-10-04 ENCOUNTER — Ambulatory Visit: Payer: Medicare PPO

## 2021-10-05 ENCOUNTER — Other Ambulatory Visit: Payer: Self-pay

## 2021-10-05 DIAGNOSIS — I10 Essential (primary) hypertension: Secondary | ICD-10-CM | POA: Diagnosis not present

## 2021-10-05 DIAGNOSIS — I480 Paroxysmal atrial fibrillation: Secondary | ICD-10-CM | POA: Diagnosis not present

## 2021-10-05 DIAGNOSIS — Z85828 Personal history of other malignant neoplasm of skin: Secondary | ICD-10-CM | POA: Diagnosis not present

## 2021-10-05 DIAGNOSIS — L97822 Non-pressure chronic ulcer of other part of left lower leg with fat layer exposed: Secondary | ICD-10-CM | POA: Diagnosis not present

## 2021-10-05 DIAGNOSIS — T8189XA Other complications of procedures, not elsewhere classified, initial encounter: Secondary | ICD-10-CM | POA: Diagnosis not present

## 2021-10-05 NOTE — Progress Notes (Signed)
on 10/05/2021 08:54:50 PERLE, GIBBON ([id number]) -------------------------------------------------------------------------------- Compression Therapy Details Patient Name: [name], [name] Date of Service: 10/05/2021 8:30 AM Medical Record Number: [id number] Patient Account Number: [id number] Date of Birth/Sex: Jan 27, 1946 (75 y.o. M) Treating RN: Levora Dredge Primary Care Morgana Rowley: [name] Other Clinician: Referring Jeannie Mallinger: [name] Treating Orley Lawry/Extender: Tito Dine in Treatment: 6 Compression Therapy Performed for Wound Assessment: Wound #3 Left,Medial Lower Leg Performed By: Clinician Levora Dredge, RN Compression Type: Three Layer Notes applied to left lower extremity Electronic Signature(s) Signed: 10/05/2021 10:53:54 AM By: Levora Dredge Entered By: Levora Dredge on 10/05/2021 08:53:23 [name], [name] ([id number]) -------------------------------------------------------------------------------- Encounter Discharge Information Details Patient Name: [name], [name] R. Date of Service: 10/05/2021 8:30 AM Medical Record Number: [id number] Patient Account Number: [id number] Date of Birth/Sex: 24-Jun-1946 (75 y.o. M) Treating RN: Levora Dredge Primary Care Chia Rock: [name] Other Clinician: Referring Josafat Enrico: [name] Treating Jafar Poffenberger/Extender: Tito Dine in Treatment: 6 Encounter Discharge Information Items Discharge Condition: Stable Ambulatory Status: Ambulatory Discharge Destination: Home Transportation: Private Auto Accompanied By: self Schedule Follow-up Appointment: Yes Clinical Summary of Care: Electronic Signature(s) Signed: 10/05/2021 10:53:54 AM By: Levora Dredge Entered By: Levora Dredge on 10/05/2021  08:54:43 [name], [name] ([id number]) -------------------------------------------------------------------------------- Wound Assessment Details Patient Name: [name], [name] R. Date of Service: 10/05/2021 8:30 AM Medical Record Number: [id number] Patient Account Number: [id number] Date of Birth/Sex: Jun 12, 1946 (75 y.o. M) Treating RN: Levora Dredge Primary Care Abdikadir Fohl: [name] Other Clinician: Referring Lindsee Labarre: [name] Treating Rylie Limburg/Extender: Tito Dine in Treatment: 6 Wound Status Wound Number: 3 Primary Etiology: Open Surgical Wound Wound Location: Left, Medial Lower Leg Wound Status: Open Wounding Event: Surgical Injury Comorbid History: Arrhythmia, Hypertension, Type II Diabetes, Gout Date Acquired: 04/02/2021 Weeks Of Treatment: 6 Clustered Wound: No Wound Measurements Length: (cm) 0.6 Width: (cm) 0.7 Depth: (cm) 0.1 Area: (cm) 0.33 Volume: (cm) 0.033 % Reduction in Area: 87.3% % Reduction in Volume: 93.6% Epithelialization: Small (1-33%) Tunneling: No Undermining: No Wound Description Classification: Full Thickness Without Exposed Support Structu Exudate Amount: Medium Exudate Type: Serosanguineous Exudate Color: red, brown res Foul Odor After Cleansing: No Slough/Fibrino Yes Wound Bed Granulation Amount: None Present (0%) Exposed Structure Necrotic Amount: Large (67-100%) Fascia Exposed: No Necrotic Quality: Eschar Fat Layer (Subcutaneous Tissue) Exposed: No Tendon Exposed: No Muscle Exposed: No Joint Exposed: No Bone Exposed: No Assessment Notes wound washed, large scab, no drainage noted. Dressing placed as ordered. Treatment Notes Wound #3 (Lower Leg) Wound Laterality: Left, Medial Cleanser Soap and Water Discharge Instruction: Gently cleanse wound with antibacterial soap, rinse and pat dry prior to dressing wounds Wound Cleanser Discharge Instruction: Wash your hands with soap and water. Remove old dressing,  discard into plastic bag and place into trash. Cleanse the wound with Wound Cleanser prior to applying a clean dressing using gauze sponges, not tissues or cotton balls. Do not scrub or use excessive force. Pat dry using gauze sponges, not tissue or cotton balls. Peri-Wound Care Topical Theraderm Moisture Lotion Discharge Instruction: apply to dry skin of leg Primary Dressing [facility] Oil Emulsion Dressings, 3x8 (in/in) Secondary Dressing [name], [name] R. ([id number]) Gauze Secured With Compression Wrap Profore Lite LF 3 Multilayer Compression Bandaging System Discharge Instruction: Apply 3 multi-layer wrap as prescribed. Compression Stockings Add-Ons Electronic Signature(s) Signed: 10/05/2021 10:53:54 AM By: Levora Dredge Entered By: Levora Dredge on 10/05/2021 08:52:48  [name], [name] ([id number]) Visit Report for 10/05/2021 Arrival Information Details Patient Name: [name], [name] Date of Service: 10/05/2021 8:30 AM Medical Record Number: [id number] Patient Account Number: [id number] Date of Birth/Sex: 06-04-1946 (75 y.o. M) Treating RN: Levora Dredge Primary Care Huriel Matt: [name] Other Clinician: Referring Avrey Flanagin: [name] Treating Cerinity Zynda/Extender: Tito Dine in Treatment: 6 Visit Information History Since Last Visit Added or deleted any medications: No Patient Arrived: Ambulatory Any new allergies or adverse reactions: No Arrival Time: 08:26 Had a fall or experienced change in No Accompanied By: self activities of daily living that may affect Transfer Assistance: None risk of falls: Patient Identification Verified: Yes Hospitalized since last visit: No Secondary Verification Process Completed: Yes Has Dressing in Place as Prescribed: Yes Patient Requires Transmission-Based No Has Compression in Place as Prescribed: Yes Precautions: Pain Present Now: No Patient Has Alerts: Yes Patient Alerts: Patient on Blood Thinner non compressable Electronic Signature(s) Signed: 10/05/2021 10:53:54 AM By: Levora Dredge Entered By: Levora Dredge on 10/05/2021 08:30:45 [name] ([id number]) -------------------------------------------------------------------------------- Clinic Level of Care Assessment Details Patient Name: [name], [name] R. Date of Service: 10/05/2021 8:30 AM Medical Record Number: [id number] Patient Account Number: [id number] Date of Birth/Sex: [birth year] (75 y.o. M) Treating RN: Levora Dredge Primary Care Camey Edell: [name] Other Clinician: Referring Sumiye Hirth: [name] Treating Crespin Forstrom/Extender: Tito Dine in Treatment: 6 Clinic Level of Care Assessment Items TOOL 1 Quantity Score []  - Use when EandM and Procedure is performed on INITIAL visit  0 ASSESSMENTS - Nursing Assessment / Reassessment []  - General Physical Exam (combine w/ comprehensive assessment (listed just below) when performed on new 0 pt. evals) []  - 0 Comprehensive Assessment (HX, ROS, Risk Assessments, Wounds Hx, etc.) ASSESSMENTS - Wound and Skin Assessment / Reassessment []  - Dermatologic / Skin Assessment (not related to wound area) 0 ASSESSMENTS - Ostomy and/or Continence Assessment and Care []  - Incontinence Assessment and Management 0 []  - 0 Ostomy Care Assessment and Management (repouching, etc.) PROCESS - Coordination of Care []  - Simple Patient / Family Education for ongoing care 0 []  - 0 Complex (extensive) Patient / Family Education for ongoing care []  - 0 Staff obtains Programmer, systems, Records, Test Results / Process Orders []  - 0 Staff telephones HHA, Nursing Homes / Clarify orders / etc []  - 0 Routine Transfer to another Facility (non-emergent condition) []  - 0 Routine Hospital Admission (non-emergent condition) []  - 0 New Admissions / Biomedical engineer / Ordering NPWT, Apligraf, etc. []  - 0 Emergency Hospital Admission (emergent condition) PROCESS - Special Needs []  - Pediatric / Minor Patient Management 0 []  - 0 Isolation Patient Management []  - 0 Hearing / Language / Visual special needs []  - 0 Assessment of Community assistance (transportation, [name], etc.) []  - 0 Additional assistance / Altered mentation []  - 0 Support Surface(s) Assessment (bed, cushion, seat, etc.) INTERVENTIONS - Miscellaneous []  - External ear exam 0 []  - 0 Patient Transfer (multiple staff / Civil Service fast streamer / Similar devices) []  - 0 Simple Staple / Suture removal (25 or less) []  - 0 Complex Staple / Suture removal (26 or more) []  - 0 Hypo/Hyperglycemic Management (do not check if billed separately) []  - 0 Ankle / Brachial Index (ABI) - do not check if billed separately Has the patient been seen at the hospital within the last three years:  Yes Total Score: 0 Level Of Care: ____ [name] ([id number]) Electronic Signature(s) Signed: 10/05/2021 10:53:54 AM By: Levora Dredge Entered By: Levora Dredge

## 2021-10-06 DIAGNOSIS — H43812 Vitreous degeneration, left eye: Secondary | ICD-10-CM | POA: Diagnosis not present

## 2021-10-06 NOTE — Progress Notes (Signed)
[name], [name] ([id number]) Visit Report for 10/05/2021 Physician Orders Details Patient Name: [name], [name] Date of Service: 10/05/2021 8:30 AM Medical Record Number: [id number] Patient Account Number: [id number] Date of Birth/Sex: June 23, 1946 (75 y.o. M) Treating RN: Levora Dredge Primary Care Provider: [name] Other Clinician: Referring Provider: [name] Treating Provider/Extender: Tito Dine in Treatment: 6 Verbal / Phone Orders: No Diagnosis Coding Follow-up Appointments o Return Appointment in 1 week. - provider week of 1/3 o Nurse Visit as needed Bathing/ Shower/ Hygiene o May shower with wound dressing protected with water repellent cover or cast protector. o No tub bath. Anesthetic (Use 'Patient Medications' Section for Anesthetic Order Entry) o Lidocaine applied to wound bed Edema Control - Lymphedema / Segmental Compressive Device / Other o Elevate, Exercise Daily and Avoid Standing for Long Periods of Time. o Elevate legs to the level of the heart and pump ankles as often as possible o Elevate leg(s) parallel to the floor when sitting. Additional Orders / Instructions o Follow Nutritious Diet and Increase Protein Intake Wound Treatment Wound #3 - Lower Leg Wound Laterality: Left, Medial Cleanser: Soap and Water 1 x Per Week/15 Days Discharge Instructions: Gently cleanse wound with antibacterial soap, rinse and pat dry prior to dressing wounds Cleanser: Wound Cleanser 1 x Per Week/15 Days Discharge Instructions: Wash your hands with soap and water. Remove old dressing, discard into plastic bag and place into trash. Cleanse the wound with Wound Cleanser prior to applying a clean dressing using gauze sponges, not tissues or cotton balls. Do not scrub or use excessive force. Pat dry using gauze sponges, not tissue or cotton balls. Topical: Theraderm Moisture Lotion 1 x Per Week/15 Days Discharge Instructions: apply to dry  skin of leg Primary Dressing: [employer] Oil Emulsion Dressings, 3x8 (in/in) 1 x Per Week/15 Days Secondary Dressing: Gauze 1 x Per Week/15 Days Compression Wrap: Profore Lite LF 3 Multilayer Compression Bandaging System 1 x Per Week/15 Days Discharge Instructions: Apply 3 multi-layer wrap as prescribed. Electronic Signature(s) Signed: 10/05/2021 10:53:54 AM By: Levora Dredge Signed: 10/06/2021 4:08:28 PM By: Linton Ham MD Entered By: Levora Dredge on 10/05/2021 08:54:14 [name], [name] ([id number]) -------------------------------------------------------------------------------- SuperBill Details Patient Name: [name], [name] Date of Service: 10/05/2021 Medical Record Number: [id number] Patient Account Number: [id number] Date of Birth/Sex: 02-Jun-1946 (75 y.o. 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

## 2021-10-11 ENCOUNTER — Other Ambulatory Visit: Payer: Self-pay

## 2021-10-11 DIAGNOSIS — N138 Other obstructive and reflux uropathy: Secondary | ICD-10-CM

## 2021-10-12 ENCOUNTER — Other Ambulatory Visit: Payer: Self-pay

## 2021-10-12 ENCOUNTER — Encounter: Payer: Medicare PPO | Attending: Physician Assistant | Admitting: Physician Assistant

## 2021-10-12 DIAGNOSIS — Z7901 Long term (current) use of anticoagulants: Secondary | ICD-10-CM | POA: Insufficient documentation

## 2021-10-12 DIAGNOSIS — E1151 Type 2 diabetes mellitus with diabetic peripheral angiopathy without gangrene: Secondary | ICD-10-CM | POA: Insufficient documentation

## 2021-10-12 DIAGNOSIS — T8189XA Other complications of procedures, not elsewhere classified, initial encounter: Secondary | ICD-10-CM | POA: Diagnosis not present

## 2021-10-12 DIAGNOSIS — C44729 Squamous cell carcinoma of skin of left lower limb, including hip: Secondary | ICD-10-CM | POA: Diagnosis not present

## 2021-10-12 DIAGNOSIS — L97822 Non-pressure chronic ulcer of other part of left lower leg with fat layer exposed: Secondary | ICD-10-CM | POA: Diagnosis not present

## 2021-10-12 DIAGNOSIS — I872 Venous insufficiency (chronic) (peripheral): Secondary | ICD-10-CM | POA: Diagnosis not present

## 2021-10-12 DIAGNOSIS — I10 Essential (primary) hypertension: Secondary | ICD-10-CM | POA: Insufficient documentation

## 2021-10-12 DIAGNOSIS — I480 Paroxysmal atrial fibrillation: Secondary | ICD-10-CM | POA: Diagnosis not present

## 2021-10-12 DIAGNOSIS — E11622 Type 2 diabetes mellitus with other skin ulcer: Secondary | ICD-10-CM | POA: Insufficient documentation

## 2021-10-12 DIAGNOSIS — I428 Other cardiomyopathies: Secondary | ICD-10-CM | POA: Insufficient documentation

## 2021-10-12 NOTE — Progress Notes (Addendum)
[name] ([id number]) Visit Report for 10/12/2021 Chief Complaint Document Details Patient Name: [name], [name] Date of Service: 10/12/2021 10:30 AM Medical Record Number: [id number] Patient Account Number: [id number] Date of Birth/Sex: [birth year] (76 y.o. M) Treating RN: Levora Dredge Primary Care Provider: [name] Other Clinician: Referring Provider: [name] Treating Provider/Extender: Skipper Cliche in Treatment: 7 Information Obtained from: Patient Chief Complaint Left LE Ulcer Electronic Signature(s) Signed: 10/12/2021 10:55:05 AM By: [name] PA-C Entered By: [name] on 10/12/2021 10:55:05 [name] ([id number]) -------------------------------------------------------------------------------- HPI Details Patient Name: [name], [name] Date of Service: 10/12/2021 10:30 AM Medical Record Number: [id number] Patient Account Number: [id number] Date of Birth/Sex: July 06, 1946 (76 y.o. 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 the patient has had a vascular workup over 7 years ago and said everything was normal at that stage. He does not have any chronic problems except for cardiac issues which he sees a cardiologist in [place]. 08/15/2017 -- arterial and venous duplex studies still pending. 08/23/2017 -- venous reflux studies done on 08/13/2017 shows venous incompetence  throughout the left lower extremity deep system and focally at the left saphenofemoral junction. 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 No evidence of SVT or DVT in bilateral lower extremities The patient has an appointment at the end of the month to get his arterial duplex study done 09/05/2017 -- the patient was seen at the vein and vascular office yesterday by Angelena Form. ABI studies were notable for medial calcification and the toe brachial indices were normal and bilateral ankle-brachial) waveforms were normal with triphasic flow. After review of his venous studies he was not a candidate for laser ablation and his lymphedema was to be treated with compression stockings and lymphedema pump pumps 09/12/2017 -- had a low arterial study done at the [facility] -- unable to obtain reliable ABI is due to medial calcification. Bilateral toe indices were normal with the right being 1.01 and the left being 0.92 and the waveforms were triphasic bilaterally. he did get hold of 30-40 mm compression stockings but is unable to put these on. We will try and get him alternative compression stockings. 09/26/17- he is here in follow up evaluation of a right lower extremity ulcer;he is compliant in wearing compression stocking; ulcer almost epithelialized , anticipate healing next appointment Readmission: 11/17 point upon evaluation patient's wound currently that he is seeing [place] for today is a skin cancerous lesion that was cleared away by his dermatologist on the left medial calf region. He tells me that this is a very similar thing to what he had done previously in fact the last time he saw him in 2018 this was also what was going on at that point. Nonetheless he feels that based on what he seeing currently that this is just having a lot of harder time healing although it is much closer to the surface than what he is experienced in the past. He notes that the  initial removal was in June 2022 which was this year this is now  November and still has not closed. He does have some edema and definitely I think that there is some venous component to his slow healing here. Also think that we can do something better than Vaseline to try to help with getting this to clear up as quickly as possible. He does have a history of atrial fibrillation and is on Eliquis otherwise he really has no major medical problems that would affect wound healing. 09/07/2021 upon evaluation today patient actually appears to be doing significantly better after having wrapped him last week. Overall I think that this is making significant improvements at this time which is great news. I do not see any evidence of infection which is great news as well. No fevers, chills, nausea, vomiting, or diarrhea. 09/14/2021 upon evaluation today patient appears to be doing well with regard to his leg ulcer. He has been tolerating the dressing changes and overall I think that he is making excellent progress. I do not see any signs of active infection at this time. 09/21/2021 upon evaluation today patient actually appears to be making good progress with regard to his wound this is again measuring smaller today no debridement seems to be necessary. We have been using a silver collagen dressing and I think that is doing an awesome job. 09/28/2021 upon evaluation today patient appears to be doing well with regard to his leg currently. I do not see any signs of active infection at this time which is great news. No fevers, chills, nausea, vomiting, or diarrhea. I think this wound is very close to complete resolution. 10/12/2021 upon evaluation today patient actually appears to be doing awesome in regard to his leg ulcer. In fact this appears to be completely healed based on what I am seeing currently. I do not see any evidence of active infection locally nor systemically at this time which is also great news. No  fevers, chills, nausea, vomiting, or diarrhea. Electronic Signature(s) Signed: 10/13/2021 10:52:42 AM By: [name] PA-C Entered By: [name] on 10/13/2021 10:52:42 [name], [name] ([id number]) -------------------------------------------------------------------------------- Physical Exam Details Patient Name: EXODUS, KUTZER [name] Date of Service: 10/12/2021 10:30 AM Medical Record Number: [id number] Patient Account Number: [id number] Date of Birth/Sex: November 09, 1945 (76 y.o. M) Treating RN: Levora Dredge Primary Care Provider: [name] Other Clinician: Referring Provider: [name] Treating Provider/Extender: Skipper Cliche in Treatment: 7 Constitutional Well-nourished and well-hydrated in no acute distress. Respiratory normal breathing without difficulty. Psychiatric this patient is able to make decisions and demonstrates good insight into disease process. Alert and Oriented x 3. pleasant and cooperative. Notes Upon inspection patient's wound bed showed signs of good epithelization at this point. Fortunately I do not see any evidence of active infection locally nor systemically which is great news and overall I am extremely pleased with where we stand. I think he is ready for discharge. Electronic Signature(s) Signed: 10/13/2021 10:53:03 AM By: [name] PA-C Entered By: [name] on 10/13/2021 10:53:03 [name], [name] ([id number]) -------------------------------------------------------------------------------- Physician Orders Details Patient Name: [name], [name] Date of Service: 10/12/2021 10:30 AM Medical Record Number: [id number] Patient Account Number: [id number] Date of Birth/Sex: 1946-01-04 (76 y.o. 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 o Elevate legs to the level of the heart and pump ankles as often as possible o Elevate leg(s) parallel to the floor when sitting. Additional Orders / Instructions o Follow Nutritious Diet and Increase Protein Intake o Other: - keep protective bandage on that was placed in office for a week, if gets wet please do change and cover with a band aid for protection. Tubie grip for a week then switch to compression socks. Electronic Signature(s) Signed: 10/13/2021 5:21:10 PM By: [name] PA-C Signed: 10/16/2021 9:27:28 AM By: Levora Dredge Entered By: Levora Dredge on 10/12/2021 11:27:32 ESCHOL, AUXIER ([id number]) -------------------------------------------------------------------------------- Problem List Details Patient Name: [name], [name] Date of Service: 10/12/2021 10:30 AM Medical Record Number: [id number] Patient Account Number: [id number] Date of Birth/Sex: [birth year] (76 y.o. 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 Date of Service: 10/12/2021 10:30 AM Medical Record Number: [id number] Patient Account Number: [id number] Date of Birth/Sex: 29-Mar-1946 (76 y.o. M) Treating RN: Levora Dredge Primary Care Provider: [name] Other Clinician: Referring Provider: [name] Treating Provider/Extender: Skipper Cliche in Treatment: 7 Subjective Chief Complaint Information obtained from Patient Left LE Ulcer History of Present Illness (HPI) 76 year old male who has a past medical history of essential hypertension, chronic atrial fibrillation, peripheral vascular disease, nonischemic cardiomyopathy,venous stasis dermatitis, gouty arthropathy, basal cell carcinoma of the right lower extremity, benign prostatic hypertrophy, long- term use of anticoagulation therapy, hyperglycemia and exercise intolerance has never been a smoker. the patient has had a vascular workup over 7 years ago and said everything was normal at that stage. He does not have any chronic problems except for cardiac issues which he sees a cardiologist in [place]. 08/15/2017 -- arterial and venous duplex studies still pending. 08/23/2017 -- venous reflux studies done on 08/13/2017 shows venous incompetence throughout the left lower extremity deep system and focally at the left saphenofemoral junction. 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 No evidence of SVT or DVT in bilateral lower extremities The patient  has an appointment at the end of the month to get his arterial duplex study done 09/05/2017 -- the patient was seen at the vein and vascular office yesterday by Angelena Form. ABI studies were notable for medial calcification and the toe brachial indices were normal and bilateral ankle-brachial) waveforms were normal with triphasic flow. After review of his venous studies he was not a candidate for laser ablation and his lymphedema was to be treated with compression stockings  and lymphedema pump pumps 09/12/2017 -- had a low arterial study done at the [facility] -- unable to obtain reliable ABI is due to medial calcification. Bilateral toe indices were normal with the right being 1.01 and the left being 0.92 and the waveforms were triphasic bilaterally. he did get hold of 30-40 mm compression stockings but is unable to put these on. We will try and get him alternative compression stockings. 09/26/17- he is here in follow up evaluation of a right lower extremity ulcer;he is compliant in wearing compression stocking; ulcer almost epithelialized , anticipate healing next appointment Readmission: 11/17 point upon evaluation patient's wound currently that he is seeing [place] for today is a skin cancerous lesion that was cleared away by his dermatologist on the left medial calf region. He tells me that this is a very similar thing to what he had done previously in fact the last time he saw him in 2018 this was also what was going on at that point. Nonetheless he feels that based on what he seeing currently that this is just having a lot of harder time healing although it is much closer to the surface than what he is experienced in the past. He notes that the initial removal was in June 2022 which was this year this is now November and still has not closed. He does have some edema and definitely I think that there is some venous component to his slow healing here. Also think that we can  do something better than Vaseline to try to help with getting this to clear up as quickly as possible. He does have a history of atrial fibrillation and is on Eliquis otherwise he really has no major medical problems that would affect wound healing. 09/07/2021 upon evaluation today patient actually appears to be doing significantly better after having wrapped him last week. Overall I think that this is making significant improvements at this time which is great news. I do not see any evidence of infection which is great news as well. No fevers, chills, nausea, vomiting, or diarrhea. 09/14/2021 upon evaluation today patient appears to be doing well with regard to his leg ulcer. He has been tolerating the dressing changes and overall I think that he is making excellent progress. I do not see any signs of active infection at this time. 09/21/2021 upon evaluation today patient actually appears to be making good progress with regard to his wound this is again measuring smaller today no debridement seems to be necessary. We have been using a silver collagen dressing and I think that is doing an awesome job. 09/28/2021 upon evaluation today patient appears to be doing well with regard to his leg currently. I do not see any signs of active infection at this time which is great news. No fevers, chills, nausea, vomiting, or diarrhea. I think this wound is very close to complete resolution. 10/12/2021 upon evaluation today patient actually appears to be doing awesome in regard to his leg ulcer. In fact this appears to be completely healed based on what I am seeing currently. I do not see any evidence of active infection locally nor systemically at this time which is also great news. No fevers, chills, nausea, vomiting, or diarrhea. [name], [name] ([id number]) Objective Constitutional Well-nourished and well-hydrated in no acute distress. Vitals Time Taken: 10:48 AM, Height: 75 in, Weight: 245 lbs, BMI: 30.6,  Temperature: 98 F, Pulse: 69 bpm, Respiratory Rate: 18 breaths/min, Blood Pressure: 160/75 mmHg. Respiratory normal  breathing without difficulty. Psychiatric this patient is able to make decisions and demonstrates good insight into disease process. Alert and Oriented x 3. pleasant and cooperative. General Notes: Upon inspection patient's wound bed showed signs of good epithelization at this point. Fortunately I do not see any evidence of active infection locally nor systemically which is great news and overall I am extremely pleased with where we stand. I think he is ready for discharge. Integumentary (Hair, Skin) Wound #3 status is Healed - Epithelialized. Original cause of wound was Surgical Injury. The date acquired was: 04/02/2021. The wound has been in treatment 7 weeks. The wound is located on the Left,Medial Lower Leg. The wound measures 0cm length x 0cm width x 0cm depth; 0cm^2 area and 0cm^3 volume. There is no tunneling or undermining noted. There is a medium amount of serosanguineous drainage noted. There is no granulation within the wound bed. There is a large (67-100%) amount of necrotic tissue within the wound bed including Eschar. 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 Elevate legs to the level of the heart and pump ankles as often as possible Elevate leg(s) parallel to the floor when sitting. Additional Orders / Instructions: Follow  Nutritious Diet and Increase Protein Intake Other: - keep protective bandage on that was placed in office for a week, if gets wet please do change and cover with a band aid for protection. Tubie grip for a week then switch to compression socks. 1. Patient appears to be completely healed and this is also news. I do not see any signs of infection and overall I think he is doing quite well. 2. I am also can recommend that we have the patient continue with the compression socks at home which I think will help keep the edema under control he is in agreement with that plan as well. We will see him back for follow-up visit as needed. Electronic Signature(s) Signed: 10/13/2021 10:53:52 AM By: [name] PA-C Entered By: [name] on 10/13/2021 10:53:52 Shuping, Derby. ([id number]) MANNIX, KROEKER ([id number]) -------------------------------------------------------------------------------- SuperBill Details Patient Name: JAKYREN, FLUEGGE. Date of Service: 10/12/2021 Medical Record Number: [id number] Patient Account Number: [id number] Date of Birth/Sex: 06-May-1946 (76 y.o. 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, including hip L97.822 Non-pressure chronic ulcer of other part of left lower leg with fat layer exposed I48.0 Paroxysmal atrial fibrillation I10 Essential (primary) hypertension Facility Procedures CPT4 Code: [id number] Description: [phone] - WOUND CARE VISIT-LEV 2 EST PT Modifier: Quantity: 1 Physician Procedures CPT4 Code: 5277824 Description: 23536 - WC PHYS LEVEL 3 - EST PT Modifier: Quantity: 1 CPT4 Code: Description: ICD-10 Diagnosis Description C44.729 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

## 2021-10-13 ENCOUNTER — Encounter: Payer: Self-pay | Admitting: Urology

## 2021-10-13 ENCOUNTER — Other Ambulatory Visit: Payer: Self-pay

## 2021-10-16 NOTE — Progress Notes (Signed)
[name], [name] ([id number]) Visit Report for 10/12/2021 Arrival Information Details Patient Name: [name], [name] Date of Service: 10/12/2021 10:30 AM Medical Record Number: [id number] Patient Account Number: [id number] Date of Birth/Sex: Sep 21, 1946 (76 y.o. M) Treating RN: Levora Dredge Primary Care Pratyush Ammon: [name] Other Clinician: Referring Labella Zahradnik: [name] Treating Melannie Metzner/Extender: Skipper Cliche in Treatment: 7 Visit Information History Since Last Visit Added or deleted any medications: No Patient Arrived: Ambulatory Any new allergies or adverse reactions: No Arrival Time: 10:47 Had a fall or experienced change in No Accompanied By: self activities of daily living that may affect Transfer Assistance: None risk of falls: Patient Identification Verified: Yes Hospitalized since last visit: No Secondary Verification Process Completed: Yes Has Dressing in Place as Prescribed: Yes Patient Requires Transmission-Based No Pain Present Now: No Precautions: Patient Has Alerts: Yes Patient Alerts: Patient on Blood Thinner non compressable Electronic Signature(s) Signed: 10/16/2021 9:27:28 AM By: Levora Dredge Entered By: Levora Dredge on 10/12/2021 10:48:04 [name] ([id number]) -------------------------------------------------------------------------------- Clinic Level of Care Assessment Details Patient Name: [name], [name] Date of Service: 10/12/2021 10:30 AM Medical Record Number: [id number] Patient Account Number: [id number] Date of Birth/Sex: [birth year] (76 y.o. M) Treating RN: Levora Dredge Primary Care Varnell Donate: [name] Other Clinician: Referring Yaeli Hartung: [name] Treating Jaidalyn Schillo/Extender: Skipper Cliche in Treatment: 7 Clinic Level of Care Assessment Items TOOL 4 Quantity Score X - Use when only an EandM is performed on FOLLOW-UP visit 1 0 ASSESSMENTS - Nursing Assessment / Reassessment []  - Reassessment of  Co-morbidities (includes updates in patient status) 0 []  - 0 Reassessment of Adherence to Treatment Plan ASSESSMENTS - Wound and Skin Assessment / Reassessment X - Simple Wound Assessment / Reassessment - one wound 1 5 []  - 0 Complex Wound Assessment / Reassessment - multiple wounds []  - 0 Dermatologic / Skin Assessment (not related to wound area) ASSESSMENTS - Focused Assessment []  - Circumferential Edema Measurements - multi extremities 0 []  - 0 Nutritional Assessment / Counseling / Intervention []  - 0 Lower Extremity Assessment (monofilament, tuning fork, pulses) []  - 0 Peripheral Arterial Disease Assessment (using hand held doppler) ASSESSMENTS - Ostomy and/or Continence Assessment and Care []  - Incontinence Assessment and Management 0 []  - 0 Ostomy Care Assessment and Management (repouching, etc.) PROCESS - Coordination of Care X - Simple Patient / Family Education for ongoing care 1 15 []  - 0 Complex (extensive) Patient / Family Education for ongoing care []  - 0 Staff obtains Programmer, systems, Records, Test Results / Process Orders []  - 0 Staff telephones HHA, Nursing Homes / Clarify orders / etc []  - 0 Routine Transfer to another Facility (non-emergent condition) []  - 0 Routine Hospital Admission (non-emergent condition) []  - 0 New Admissions / Biomedical engineer / Ordering NPWT, Apligraf, etc. []  - 0 Emergency Hospital Admission (emergent condition) []  - 0 Simple Discharge Coordination []  - 0 Complex (extensive) Discharge Coordination PROCESS - Special Needs []  - Pediatric / Minor Patient Management 0 []  - 0 Isolation Patient Management []  - 0 Hearing / Language / Visual special needs []  - 0 Assessment of Community assistance (transportation, [name], etc.) X- 1 15 Additional assistance / Altered mentation []  - 0 Support Surface(s) Assessment (bed, cushion, seat, etc.) INTERVENTIONS - Wound Cleansing / Measurement Paxson, Kalub R. ([id number]) X- 1  5 Simple Wound Cleansing - one wound []  - 0 Complex Wound Cleansing - multiple wounds X- 1 5 Wound Imaging (photographs - any number of wounds) []  - 0 Wound Tracing (instead of photographs)  [name], [name] ([id number]) Visit Report for 10/12/2021 Arrival Information Details Patient Name: [name], [name] Date of Service: 10/12/2021 10:30 AM Medical Record Number: [id number] Patient Account Number: [id number] Date of Birth/Sex: Sep 21, 1946 (76 y.o. M) Treating RN: Levora Dredge Primary Care Pratyush Ammon: [name] Other Clinician: Referring Labella Zahradnik: [name] Treating Melannie Metzner/Extender: Skipper Cliche in Treatment: 7 Visit Information History Since Last Visit Added or deleted any medications: No Patient Arrived: Ambulatory Any new allergies or adverse reactions: No Arrival Time: 10:47 Had a fall or experienced change in No Accompanied By: self activities of daily living that may affect Transfer Assistance: None risk of falls: Patient Identification Verified: Yes Hospitalized since last visit: No Secondary Verification Process Completed: Yes Has Dressing in Place as Prescribed: Yes Patient Requires Transmission-Based No Pain Present Now: No Precautions: Patient Has Alerts: Yes Patient Alerts: Patient on Blood Thinner non compressable Electronic Signature(s) Signed: 10/16/2021 9:27:28 AM By: Levora Dredge Entered By: Levora Dredge on 10/12/2021 10:48:04 [name] ([id number]) -------------------------------------------------------------------------------- Clinic Level of Care Assessment Details Patient Name: [name], [name] Date of Service: 10/12/2021 10:30 AM Medical Record Number: [id number] Patient Account Number: [id number] Date of Birth/Sex: [birth year] (76 y.o. M) Treating RN: Levora Dredge Primary Care Varnell Donate: [name] Other Clinician: Referring Yaeli Hartung: [name] Treating Jaidalyn Schillo/Extender: Skipper Cliche in Treatment: 7 Clinic Level of Care Assessment Items TOOL 4 Quantity Score X - Use when only an EandM is performed on FOLLOW-UP visit 1 0 ASSESSMENTS - Nursing Assessment / Reassessment []  - Reassessment of  Co-morbidities (includes updates in patient status) 0 []  - 0 Reassessment of Adherence to Treatment Plan ASSESSMENTS - Wound and Skin Assessment / Reassessment X - Simple Wound Assessment / Reassessment - one wound 1 5 []  - 0 Complex Wound Assessment / Reassessment - multiple wounds []  - 0 Dermatologic / Skin Assessment (not related to wound area) ASSESSMENTS - Focused Assessment []  - Circumferential Edema Measurements - multi extremities 0 []  - 0 Nutritional Assessment / Counseling / Intervention []  - 0 Lower Extremity Assessment (monofilament, tuning fork, pulses) []  - 0 Peripheral Arterial Disease Assessment (using hand held doppler) ASSESSMENTS - Ostomy and/or Continence Assessment and Care []  - Incontinence Assessment and Management 0 []  - 0 Ostomy Care Assessment and Management (repouching, etc.) PROCESS - Coordination of Care X - Simple Patient / Family Education for ongoing care 1 15 []  - 0 Complex (extensive) Patient / Family Education for ongoing care []  - 0 Staff obtains Programmer, systems, Records, Test Results / Process Orders []  - 0 Staff telephones HHA, Nursing Homes / Clarify orders / etc []  - 0 Routine Transfer to another Facility (non-emergent condition) []  - 0 Routine Hospital Admission (non-emergent condition) []  - 0 New Admissions / Biomedical engineer / Ordering NPWT, Apligraf, etc. []  - 0 Emergency Hospital Admission (emergent condition) []  - 0 Simple Discharge Coordination []  - 0 Complex (extensive) Discharge Coordination PROCESS - Special Needs []  - Pediatric / Minor Patient Management 0 []  - 0 Isolation Patient Management []  - 0 Hearing / Language / Visual special needs []  - 0 Assessment of Community assistance (transportation, [name], etc.) X- 1 15 Additional assistance / Altered mentation []  - 0 Support Surface(s) Assessment (bed, cushion, seat, etc.) INTERVENTIONS - Wound Cleansing / Measurement Paxson, Kalub R. ([id number]) X- 1  5 Simple Wound Cleansing - one wound []  - 0 Complex Wound Cleansing - multiple wounds X- 1 5 Wound Imaging (photographs - any number of wounds) []  - 0 Wound Tracing (instead of photographs)  X- 1 5 Simple Wound Measurement - one wound []  - 0 Complex Wound Measurement - multiple wounds INTERVENTIONS - Wound Dressings X - Small Wound Dressing one or multiple wounds 1 10 []  - 0 Medium Wound Dressing one or multiple wounds []  - 0 Large Wound Dressing one or multiple wounds []  - 0 Application of Medications - topical []  - 0 Application of Medications - injection INTERVENTIONS - Miscellaneous []  - External ear exam 0 []  - 0 Specimen Collection (cultures, biopsies, blood, body fluids, etc.) []  - 0 Specimen(s) / Culture(s) sent or taken to Lab for analysis []  - 0 Patient Transfer (multiple staff / Civil Service fast streamer / Similar devices) []  - 0 Simple Staple / Suture removal (25 or less) []  - 0 Complex Staple / Suture removal (26 or more) []  - 0 Hypo / Hyperglycemic Management (close monitor of Blood Glucose) []  - 0 Ankle / Brachial Index (ABI) - do not check if billed separately X- 1 5 Vital Signs Has the patient been seen at the hospital within the last three years: Yes Total Score: 65 Level Of Care: New/Established - Level 2 Electronic Signature(s) Signed: 10/16/2021 9:27:28 AM By: Levora Dredge Entered By: Levora Dredge on 10/12/2021 11:28:18 [name] ([id number]) -------------------------------------------------------------------------------- Encounter Discharge Information Details Patient Name: SHJON, LIZARRAGA R. Date of Service: 10/12/2021 10:30 AM Medical Record Number: [id number] Patient Account Number: [id number] Date of Birth/Sex: 02/10/1946 (76 y.o. M) Treating RN: Levora Dredge Primary Care Tashai Catino: [name] Other Clinician: Referring Guyla Bless: [name] Treating Donzell Coller/Extender: Skipper Cliche in Treatment: 7 Encounter Discharge Information  Items Discharge Condition: Stable Ambulatory Status: Ambulatory Discharge Destination: Home Transportation: Private Auto Accompanied By: self Schedule Follow-up Appointment: No Clinical Summary of Care: Electronic Signature(s) Signed: 10/16/2021 9:27:28 AM By: Levora Dredge Entered By: Levora Dredge on 10/12/2021 11:29:43 [name] ([id number]) -------------------------------------------------------------------------------- Lower Extremity Assessment Details Patient Name: KENLY, XIAO R. Date of Service: 10/12/2021 10:30 AM Medical Record Number: [id number] Patient Account Number: [id number] Date of Birth/Sex: [birth year] (76 y.o. M) Treating RN: Levora Dredge Primary Care Saia Derossett: [name] Other Clinician: Referring Gradie Ohm: [name] Treating Karel Mowers/Extender: Skipper Cliche in Treatment: 7 Edema Assessment Assessed: Shirlyn Goltz: No] Patrice Paradise: No] [Left: Edema] [Right: :] Calf Left: Right: Point of Measurement: 40 cm From Medial Instep 40 cm Ankle Left: Right: Point of Measurement: 12 cm From Medial Instep 24 cm Vascular Assessment Pulses: Dorsalis Pedis Palpable: [Left:Yes] Posterior Tibial Doppler Audible: [Left:Yes] Electronic Signature(s) Signed: 10/16/2021 9:27:28 AM By: Levora Dredge Entered By: Levora Dredge on 10/12/2021 11:04:36 [name] ([id number]) -------------------------------------------------------------------------------- Multi Wound Chart Details Patient Name: [name] Date of Service: 10/12/2021 10:30 AM Medical Record Number: [id number] Patient Account Number: [id number] Date of Birth/Sex: 1946/06/07 (77 y.o. M) Treating RN: Levora Dredge Primary Care Jannell Franta: [name] Other Clinician: Referring Quintavia Rogstad: [name] Treating Arya Luttrull/Extender: Skipper Cliche in Treatment: 7 Vital Signs Height(in): 75 Pulse(bpm): 53 Weight(lbs): 245 Blood Pressure(mmHg): 160/75 Body Mass Index(BMI):  31 Temperature(F): 98 Respiratory Rate(breaths/min): 18 Photos: [[name]:[name]] Wound Location: Left, Medial Lower Leg [name] [name] Wounding Event: Surgical Injury [name] [name] Primary Etiology: Open Surgical Wound [name] [name] Comorbid History: Arrhythmia, Hypertension, Type II [name] [name] Diabetes, Gout Date Acquired: 04/02/2021 [name] [name] Weeks of Treatment: 7 [name] [name] Wound Status: Open [name] [name] Measurements L x W x D (cm) 0.8x1.2x0.1 [name] [name] Area (cm) : 0.754 [name] [name] Volume (cm) : 0.075 [name] [name] % Reduction in Area: 70.90% [name] [name] % Reduction in Volume: 85.50% [name] [name] Classification: Full  11:20:02 LEDFORD, GOODSON ([id number]) -------------------------------------------------------------------------------- Vitals Details Patient Name: ZADOK, HOLAWAY. Date of Service: 10/12/2021 10:30 AM Medical Record Number: [id number] Patient Account Number: [id number] Date of Birth/Sex: 26-Oct-1945 (76 y.o. M) Treating RN: Levora Dredge Primary Care Wendy Mikles: [name] Other Clinician: Referring Dirk Vanaman: [name] Treating Claudine Stallings/Extender: Skipper Cliche in Treatment: 7 Vital Signs Time Taken: 10:48 Temperature (F): 98 Height (in): 75 Pulse (bpm): 69 Weight (lbs): 245 Respiratory Rate (breaths/min): 18 Body Mass Index (BMI): 30.6 Blood Pressure (mmHg): 160/75 Reference Range: 80 - 120 mg / dl Electronic Signature(s) Signed: 10/16/2021 9:27:28 AM By: Levora Dredge Entered By: Levora Dredge on 10/12/2021 10:49:50

## 2021-10-17 NOTE — Progress Notes (Signed)
10/18/2021  11:29 AM   [name] Jun 02, 1946 [id number]  Referring provider: Birdie Sons, [address] [place],  [place] [address]  Chief Complaint  Patient presents with   Benign Prostatic Hypertrophy   Urological history 1. ED -Contributing factors of age, BPH, hypertension, CHF, testosterone deficiency, Peyronie's disease and prediabetes -IPP 2015  2. BPH with LU TS -PSA pending -I PSS 8/2   HPI: [name]  is a 76 y.o. male presenting for his annual exam.   He denies any urinary issues at this time.  He gets up two times a night.  Patient denies any modifying or aggravating factors.  Patient denies any gross hematuria, dysuria or suprapubic/flank pain.  Patient denies any fevers, chills, nausea or vomiting.    IPSS     Row Name 10/18/21 1100         International Prostate Symptom Score   How often have you had the sensation of not emptying your bladder? Less than 1 in 5     How often have you had to urinate less than every two hours? Less than 1 in 5 times     How often have you found you stopped and started again several times when you urinated? Less than 1 in 5 times     How often have you found it difficult to postpone urination? Less than 1 in 5 times     How often have you had a weak urinary stream? Less than 1 in 5 times     How often have you had to strain to start urination? Less than 1 in 5 times     How many times did you typically get up at night to urinate? 2 Times     Total IPSS Score 8       Quality of Life due to urinary symptoms   If you were to spend the rest of your life with your urinary condition just the way it is now how would you feel about that? Mostly Satisfied               Score:  1-7 Mild 8-19 Moderate 20-35 Severe  PMH: Past Medical History:  Diagnosis Date   Basal cell carcinoma of right lower extremity 03/24/2015   BPH with obstruction/lower urinary tract symptoms    Chronic atrial  fibrillation (HCC)    Dislocation of shoulder, anterior, right, closed 07/24/2014   Echocardiogram abnormal    2009 moderate to severely dilated left atrium   Erectile dysfunction    Essential hypertension    Gout    History of chicken pox    History of measles    Hyperglycemia    Hypogonadism in male    Nocturia    Peyronie's disease    Traumatic tear of right rotator cuff 07/24/2014   Venous stasis    chronic   Surgical History: Past Surgical History:  Procedure Laterality Date   CARDIAC CATHETERIZATION  03/28/2004   normal coronaries, reduced EF at 25-30% (Dr. Gerrie Nordmann)   CARDIOVERSION, TEE guided  03/29/2004   Dr. Gerrie Nordmann    COLONOSCOPY  05/28/2013   Dr. Rayann Heman   HERNIA REPAIR  age 26   KNEE SURGERY     LUMBAR FUSION  age 30   MOHS SURGERY Right 08/20/2019   ALA done by Ishmael Holter, MD [facility]   [place] TEST  07/2003   anterior wall thinning, LV systolic function depressed at 33%  PILONIDAL CYST EXCISION     Right Eyebrow Tumor Removed  age 39    SHOULDER ARTHROSCOPY WITH ROTATOR CUFF REPAIR AND SUBACROMIAL DECOMPRESSION Right 08/19/2014   Procedure: SHOULDER ARTHROSCOPY WITH ROTATOR CUFF REPAIR AND SUBACROMIAL DECOMPRESSION;  Surgeon: Lorn Junes, MD;  Location: [place];  Service: Orthopedics;  Laterality: Right;   TOTAL HIP ARTHROPLASTY Right 03/25/2006   Dr. Wanda Plump. [place]   TRANSTHORACIC ECHOCARDIOGRAM  08/2008   EF=>55%, mild-mod dilated; LA mod-severely dilated; RA mild-mod dilated; mild calcif of MV, borderline MVP, trace MR; trace TR; mild pulm valve regurg    Home Medications:  Allergies as of 10/18/2021       Reactions   Benazepril    COUGH         Medication List        Accurate as of October 18, 2021 11:29 AM. If you have any questions, ask your nurse or doctor.          carvedilol 12.5 MG tablet Commonly known as: COREG TAKE 1 TABLET BY MOUTH TWICE A DAY   chlorthalidone 25 MG tablet Commonly  known as: HYGROTON TAKE 1 TABLET BY MOUTH ONCE DAILY IN THEMORNING   furosemide 20 MG tablet Commonly known as: LASIX Take 1 tablet (20 mg total) by mouth daily as needed for edema (UP TO 3X QWEEK ONLY).   multivitamin tablet Take 1 tablet by mouth daily.   rivaroxaban 20 MG Tabs tablet Commonly known as: Xarelto TAKE 1 TABLET BY MOUTH ONCE DAILY WITH SUPPER   valsartan 160 MG tablet Commonly known as: DIOVAN Take 1 tablet (160 mg total) by mouth daily.       Allergies:  Allergies  Allergen Reactions   Benazepril     COUGH     Family History: Family History  Problem Relation Age of Onset   Hypertension Father    Prostate cancer Father    Bladder Cancer Father    Hypertension Mother    Stroke Maternal Grandfather    Diabetes Paternal Grandfather    Supraventricular tachycardia Brother    Arthritis Sister        RA   Diabetes Maternal Uncle        TYPE 2   Supraventricular tachycardia Child    Kidney disease Neg Hx    Heart attack Neg Hx    Colon cancer Neg Hx    Social History:  reports that he has never smoked. He has never used smokeless tobacco. He reports current alcohol use of about 4.0 standard drinks per week. He reports that he does not use drugs.  Pertinent ROS in HPI  Physical Exam: BP (!) 161/94    Pulse 76    Ht 6\' 3"  (1.905 m)    Wt 256 lb (116.1 kg)    BMI 32.00 kg/m   Constitutional:  Well nourished. Alert and oriented, No acute distress. HEENT:  AT, mask in place.  Trachea midline Cardiovascular: No clubbing, cyanosis, or edema. Respiratory: Normal respiratory effort, no increased work of breathing. GU: No CVA tenderness.  No bladder fullness or masses.  Patient with circumcised phallus.  Urethral meatus is patent.  No penile discharge. No penile lesions or rashes. Cylinders in place.  Scrotum without lesions, cysts, rashes and/or edema.  Pump in place.  Testicles are located scrotally bilaterally. No masses are appreciated in the testicles.  Left and right epididymis are normal. Rectal: Patient with  normal sphincter tone. Anus and perineum without scarring or rashes. No rectal  masses are appreciated. Prostate is approximately 45 grams, no nodules are appreciated. Seminal vesicles could not be palpated.   Neurologic: Grossly intact, no focal deficits, moving all 4 extremities. Psychiatric: Normal mood and affect.   Laboratory Data: WBC (White Blood Cell Count) 4.1 - 10.2 103/uL 5.9   RBC (Red Blood Cell Count) 4.69 - 6.13 106/uL 4.30 Low    Hemoglobin 14.1 - 18.1 gm/dL 14.0 Low    Hematocrit 40.0 - 52.0 % 40.5   MCV (Mean Corpuscular Volume) 80.0 - 100.0 fl 94.2   MCH (Mean Corpuscular Hemoglobin) 27.0 - 31.2 pg 32.6 High    MCHC (Mean Corpuscular Hemoglobin Concentration) 32.0 - 36.0 gm/dL 34.6   Platelet Count 150 - 450 103/uL 250   RDW-CV (Red Cell Distribution Width) 11.6 - 14.8 % 12.8   MPV (Mean Platelet Volume) 9.4 - 12.4 fl 9.5   Neutrophils 1.50 - 7.80 103/uL 3.93   Lymphocytes 1.00 - 3.60 103/uL 1.04   Monocytes 0.00 - 1.50 103/uL 0.60   Eosinophils 0.00 - 0.55 103/uL 0.22   Basophils 0.00 - 0.09 103/uL 0.05   Neutrophil % 32.0 - 70.0 % 67.0   Lymphocyte % 10.0 - 50.0 % 17.8   Monocyte % 4.0 - 13.0 % 10.3   Eosinophil % 1.0 - 5.0 % 3.8   Basophil% 0.0 - 2.0 % 0.9   Immature Granulocyte % <=0.7 % 0.2   Immature Granulocyte Count <=0.06 10^3/L 0.01   Resulting Agency  [facility]  Specimen Collected: 01/24/21 12:02 Last Resulted: 01/24/21 12:36  Received From: [facility]  Result Received: 02/16/21 11:15   Component     Latest Ref Rng & Units 11/18/2020  Hemoglobin A1C     4.8 - 5.6 % 5.7 (H)  Est. average glucose Bld gHb Est-mCnc     mg/dL 117   Component     Latest Ref Rng & Units 11/18/2020  Cholesterol, Total     100 - 199 mg/dL 195  Triglycerides     0 - 149 mg/dL 96  HDL Cholesterol     >39 mg/dL 60  VLDL Cholesterol Cal     5 - 40 mg/dL 17  LDL Chol  Calc (NIH)     0 - 99 mg/dL 118 (H)  Total CHOL/HDL Ratio     0.0 - 5.0 ratio 3.3   Component     Latest Ref Rng & Units 11/18/2020  Glucose     65 - 99 mg/dL 129 (H)  BUN     8 - 27 mg/dL 15  Creatinine     0.76 - 1.27 mg/dL 1.06  GFR, Est Non African American     >59 mL/min/1.73 70  GFR, Est African American     >59 mL/min/1.73 81  BUN/Creatinine Ratio     10 - 24 14  Sodium     134 - 144 mmol/L 144  Potassium     3.5 - 5.2 mmol/L 4.4  Chloride     96 - 106 mmol/L 101  CO2     20 - 29 mmol/L 24  Calcium     8.6 - 10.2 mg/dL 10.4 (H)  Total Protein     6.0 - 8.5 g/dL 7.7  Albumin     3.7 - 4.7 g/dL 4.7  Globulin, Total     1.5 - 4.5 g/dL 3.0  Albumin/Globulin Ratio     1.2 - 2.2 1.6  Total Bilirubin     0.0 -  1.2 mg/dL 0.7  Alkaline Phosphatase     44 - 121 IU/L 88  AST     0 - 40 IU/L 20  ALT     0 - 44 IU/L 24  I have reviewed the labs.  Assessment & Plan:    1. BPH with LUTS -PSA pending -DRE benign -continue conservative management, avoiding bladder irritants and timed voiding's  2. ED -IPP in place  -Functioning properly  Return in about 1 year (around 10/18/2022) for IPSS, PSA and exam.  Laneta Simmers  [address] [place], [place] [address] [phone]

## 2021-10-18 ENCOUNTER — Other Ambulatory Visit: Payer: Self-pay

## 2021-10-18 ENCOUNTER — Ambulatory Visit: Payer: Medicare PPO | Admitting: Urology

## 2021-10-18 ENCOUNTER — Encounter: Payer: Self-pay | Admitting: Urology

## 2021-10-18 VITALS — BP 161/94 | HR 76 | Ht 75.0 in | Wt 256.0 lb

## 2021-10-18 DIAGNOSIS — N529 Male erectile dysfunction, unspecified: Secondary | ICD-10-CM

## 2021-10-18 DIAGNOSIS — N401 Enlarged prostate with lower urinary tract symptoms: Secondary | ICD-10-CM

## 2021-10-18 DIAGNOSIS — N138 Other obstructive and reflux uropathy: Secondary | ICD-10-CM

## 2021-10-19 ENCOUNTER — Encounter: Payer: Medicare PPO | Admitting: Physician Assistant

## 2021-10-19 LAB — PSA: Prostate Specific Ag, Serum: 0.4 ng/mL (ref 0.0–4.0)

## 2021-10-21 ENCOUNTER — Other Ambulatory Visit: Payer: Self-pay | Admitting: Cardiology

## 2021-10-21 DIAGNOSIS — I10 Essential (primary) hypertension: Secondary | ICD-10-CM

## 2021-10-27 ENCOUNTER — Other Ambulatory Visit: Payer: Self-pay | Admitting: Cardiology

## 2021-11-14 DIAGNOSIS — D485 Neoplasm of uncertain behavior of skin: Secondary | ICD-10-CM | POA: Diagnosis not present

## 2021-11-14 DIAGNOSIS — L905 Scar conditions and fibrosis of skin: Secondary | ICD-10-CM | POA: Diagnosis not present

## 2021-11-14 DIAGNOSIS — C44629 Squamous cell carcinoma of skin of left upper limb, including shoulder: Secondary | ICD-10-CM | POA: Diagnosis not present

## 2021-11-22 ENCOUNTER — Ambulatory Visit (INDEPENDENT_AMBULATORY_CARE_PROVIDER_SITE_OTHER): Payer: Medicare PPO

## 2021-11-22 ENCOUNTER — Ambulatory Visit (INDEPENDENT_AMBULATORY_CARE_PROVIDER_SITE_OTHER): Payer: Medicare PPO | Admitting: Family Medicine

## 2021-11-22 ENCOUNTER — Encounter: Payer: Self-pay | Admitting: Family Medicine

## 2021-11-22 ENCOUNTER — Telehealth: Payer: Self-pay

## 2021-11-22 ENCOUNTER — Other Ambulatory Visit: Payer: Self-pay

## 2021-11-22 VITALS — BP 133/65 | HR 63 | Temp 98.0°F | Resp 14 | Ht 75.0 in | Wt 252.0 lb

## 2021-11-22 DIAGNOSIS — I4821 Permanent atrial fibrillation: Secondary | ICD-10-CM

## 2021-11-22 DIAGNOSIS — I10 Essential (primary) hypertension: Secondary | ICD-10-CM | POA: Diagnosis not present

## 2021-11-22 DIAGNOSIS — D689 Coagulation defect, unspecified: Secondary | ICD-10-CM

## 2021-11-22 DIAGNOSIS — Z23 Encounter for immunization: Secondary | ICD-10-CM | POA: Diagnosis not present

## 2021-11-22 DIAGNOSIS — R739 Hyperglycemia, unspecified: Secondary | ICD-10-CM

## 2021-11-22 DIAGNOSIS — Z0000 Encounter for general adult medical examination without abnormal findings: Secondary | ICD-10-CM

## 2021-11-22 MED ORDER — TETANUS-DIPHTH-ACELL PERTUSSIS 5-2.5-18.5 LF-MCG/0.5 IM SUSY: 0.5000 mL | PREFILLED_SYRINGE | Freq: Once | INTRAMUSCULAR | 0 refills | Status: AC

## 2021-11-22 NOTE — Progress Notes (Signed)
I,Roshena L Chambers,acting as a scribe for [name], MD.,have documented all relevant documentation on the behalf of [name], MD,as directed by  [name], MD while in the presence of [name], MD.    Complete physical exam   Patient: [name]   DOB: 01/21/1946   76 y.o. Male  MRN: [id number] Visit Date: 11/22/2021  Today's healthcare provider: [name], MD   Chief Complaint  Patient presents with   Annual Exam   Hypertension   Hyperglycemia   Subjective    [name] is a 76 y.o. male who presents today for a complete physical exam.  He reports consuming a general diet.  Walks 4-5 times a week.  He generally feels fairly well. He reports sleeping well. He does not have additional problems to discuss today.   Had AWV with [facility] today at 8:20am.  HPI  Prediabetes, Follow-up  Lab Results  Component Value Date   HGBA1C 5.7 (H) 11/18/2020   HGBA1C 5.6 10/06/2018   HGBA1C 5.6 09/20/2014   GLUCOSE 129 (H) 11/18/2020   GLUCOSE 104 (H) 09/01/2019   GLUCOSE 118 (H) 03/03/2019    Last seen for for this1  year  ago.  Management since that visit includes continuing lifestyle modifications. Current symptoms include none and have been stable.  Prior visit with dietician: no Current diet: well balanced Current exercise: walking  Pertinent Labs:    Component Value Date/Time   CHOL 195 11/18/2020 1048   TRIG 96 11/18/2020 1048   TRIG 65 01/06/2009 0000   CHOLHDL 3.3 11/18/2020 1048   CHOLHDL 3.9 03/03/2019 1154   CREATININE 1.06 11/18/2020 1048    Wt Readings from Last 3 Encounters:  11/22/21 252 lb (114.3 kg)  10/18/21 256 lb (116.1 kg)  04/27/21 254 lb 6.4 oz (115.4 kg)    -----------------------------------------------------------------------------------------   Hypertension, follow-up  BP Readings from Last 3 Encounters:  11/22/21 133/65  10/18/21 (!) 161/94  04/27/21 122/70   Wt Readings from Last 3 Encounters:   11/22/21 252 lb (114.3 kg)  10/18/21 256 lb (116.1 kg)  04/27/21 254 lb 6.4 oz (115.4 kg)     He was last seen for hypertension 1  year  ago.  BP at that visit was 134/71. Management since that visit includes continuing same medication.  He reports good compliance with treatment. He is not having side effects.  He is following a Regular diet. He is exercising. He does not smoke.  Use of agents associated with hypertension: none.   Outside blood pressures are not checked. Symptoms: No chest pain No chest pressure  No palpitations No syncope  No dyspnea No orthopnea  No paroxysmal nocturnal dyspnea No lower extremity edema   Pertinent labs: Lab Results  Component Value Date   CHOL 195 11/18/2020   HDL 60 11/18/2020   LDLCALC 118 (H) 11/18/2020   TRIG 96 11/18/2020   CHOLHDL 3.3 11/18/2020   Lab Results  Component Value Date   NA 144 11/18/2020   K 4.4 11/18/2020   CREATININE 1.06 11/18/2020   GFRNONAA 70 11/18/2020   GLUCOSE 129 (H) 11/18/2020   TSH 4.450 11/18/2020     The 10-year ASCVD risk score (Arnett DK, et al., 2019) is: 24.9%   ---------------------------------------------------------------------------------------------------   Past Medical History:  Diagnosis Date   Basal cell carcinoma of right lower extremity 03/24/2015   BPH with obstruction/lower urinary tract symptoms    Chronic atrial fibrillation (HCC)    Dislocation of shoulder,  anterior, right, closed 07/24/2014   Echocardiogram abnormal    2009 moderate to severely dilated left atrium   Erectile dysfunction    Essential hypertension    Gout    History of chicken pox    History of measles    Hyperglycemia    Hypogonadism in male    Nocturia    Peyronie's disease    Traumatic tear of right rotator cuff 07/24/2014   Venous stasis    chronic   Past Surgical History:  Procedure Laterality Date   CARDIAC CATHETERIZATION  03/28/2004   normal coronaries, reduced EF at 25-30% (Dr. Gerrie Nordmann)   [place], TEE guided  03/29/2004   Dr. Gerrie Nordmann    COLONOSCOPY  05/28/2013   Dr. Rayann Heman   HERNIA REPAIR  age 26   [place]  age 55   MOHS SURGERY Right 08/20/2019   ALA done by Ishmael Holter, MD [facility]   MYOVIEW CARDIOVASCULAR STRESS TEST  07/2003   anterior wall thinning, LV systolic function depressed at 33%   PILONIDAL CYST EXCISION     Right Eyebrow Tumor Removed  age 43    SHOULDER ARTHROSCOPY WITH ROTATOR CUFF REPAIR AND SUBACROMIAL DECOMPRESSION Right 08/19/2014   Procedure: SHOULDER ARTHROSCOPY WITH ROTATOR CUFF REPAIR AND SUBACROMIAL DECOMPRESSION;  Surgeon: Lorn Junes, MD;  Location: [place];  Service: Orthopedics;  Laterality: Right;   TOTAL HIP ARTHROPLASTY Right 03/25/2006   Dr. Wanda Plump. [place]   TRANSTHORACIC ECHOCARDIOGRAM  08/2008   EF=>55%, mild-mod dilated; LA mod-severely dilated; RA mild-mod dilated; mild calcif of MV, borderline MVP, trace MR; trace TR; mild pulm valve regurg    Social History   Socioeconomic History   Marital status: Divorced    Spouse name: Not on file   Number of children: 1   Years of education: Not on file   Highest education level: Master's degree (e.g., MA, MS, MEng, MEd, MSW, MBA)  Occupational History   Occupation: retired Pharmacist, hospital, taught PE and coached  Tobacco Use   Smoking status: Never   Smokeless tobacco: Never  Vaping Use   Vaping Use: Never used  Substance and Sexual Activity   Alcohol use: Yes    Alcohol/week: 4.0 standard drinks    Types: 4 Cans of beer per week   Drug use: No   Sexual activity: Not on file  Other Topics Concern   Not on file  Social History Narrative   Divorced father of 1. Retired Education officer, museum.   ~2 beers / day. Does not smoke.   Works out 6 days a weeks - cardio & weights. He used to run and play basketball prior to his hip surgery in 2007.   Currently dating.   Social Determinants of Health   Financial Resource Strain: Low Risk     Difficulty of Paying Living Expenses: Not hard at all  Food Insecurity: No Food Insecurity   Worried About Charity fundraiser in the Last Year: Never true   [place] in the Last Year: Never true  Transportation Needs: No Transportation Needs   Lack of Transportation (Medical): No   Lack of Transportation (Non-Medical): No  Physical Activity: Insufficiently Active   Days of Exercise per Week: 3 days   Minutes of Exercise per Session: 30 min  Stress: No Stress Concern Present   Feeling of Stress : Not at all  Social Connections: Moderately Isolated   Frequency of Communication with Friends and  Family: More than three times a week   Frequency of Social Gatherings with Friends and Family: More than three times a week   Attends Religious Services: Never   Marine scientist or Organizations: No   Attends Archivist Meetings: Never   Marital Status: Living with partner  Intimate Partner Violence: Not At Risk   Fear of Current or Ex-Partner: No   Emotionally Abused: No   Physically Abused: No   Sexually Abused: No   Family Status  Relation Name Status   Father  Deceased at age 5   Mother  Alive       has back problems, borderline Diabetic   MGF  Deceased at age 64       stroke at 36   PGF  Deceased at age 25       diabetes   Brother  Alive   MGM  Deceased at age 41   [place]  Deceased at age 34   Sister  Alive   Mat Byron  Deceased   Brother  Alive   Sister  Alive   Child  (Not Specified)   Neg Hx  (Not Specified)   Family History  Problem Relation Age of Onset   Hypertension Father    Prostate cancer Father    Bladder Cancer Father    Hypertension Mother    Stroke Maternal Grandfather    Diabetes Paternal Grandfather    Supraventricular tachycardia Brother    Arthritis Sister        RA   Diabetes Maternal Uncle        TYPE 2   Supraventricular tachycardia Child    Kidney disease Neg Hx    Heart attack Neg Hx    Colon cancer Neg Hx     Allergies  Allergen Reactions   Benazepril     COUGH     Patient Care Team: Birdie Sons, MD as PCP - General (Family Medicine) Leonie Man, MD as PCP - Cardiology (Cardiology) Dasher, Rayvon Char, MD (Dermatology) Nori Riis, PA-C as Physician Assistant (Urology) System, Provider Not In   Medications: Outpatient Medications Prior to Visit  Medication Sig   carvedilol (COREG) 12.5 MG tablet TAKE 1 TABLET BY MOUTH TWICE A DAY   chlorthalidone (HYGROTON) 25 MG tablet TAKE 1 TABLET BY MOUTH ONCE DAILY IN THEMORNING   Multiple Vitamin (MULTIVITAMIN) tablet Take 1 tablet by mouth daily.   rivaroxaban (XARELTO) 20 MG TABS tablet TAKE 1 TABLET BY MOUTH ONCE DAILY WITH SUPPER   valsartan (DIOVAN) 160 MG tablet TAKE 1 TABLET BY MOUTH ONCE DAILY   furosemide (LASIX) 20 MG tablet Take 1 tablet (20 mg total) by mouth daily as needed for edema (UP TO 3X QWEEK ONLY).   No facility-administered medications prior to visit.    Review of Systems  Constitutional:  Negative for appetite change, chills, fatigue and fever.  HENT:  Negative for congestion, ear pain, hearing loss, nosebleeds and trouble swallowing.   Eyes:  Negative for pain and visual disturbance.  Respiratory:  Negative for cough, chest tightness, shortness of breath and wheezing.   Cardiovascular:  Negative for chest pain, palpitations and leg swelling.  Gastrointestinal:  Negative for abdominal pain, blood in stool, constipation, diarrhea, nausea and vomiting.  Endocrine: Negative for polydipsia, polyphagia and polyuria.  Genitourinary:  Negative for dysuria and flank pain.  Musculoskeletal:  Negative for arthralgias, back pain, joint swelling, myalgias and neck stiffness.  Skin:  Negative for color change,  rash and wound.  Neurological:  Negative for dizziness, tremors, seizures, speech difficulty, weakness, light-headedness and headaches.  Psychiatric/Behavioral:  Negative for behavioral problems, confusion,  decreased concentration, dysphoric mood and sleep disturbance. The patient is not nervous/anxious.   All other systems reviewed and are negative.    Objective    BP 133/65 (BP Location: Right Arm, Patient Position: Sitting, Cuff Size: Large)    Pulse 63    Temp 98 F (36.7 C) (Oral)    Resp 14    Ht 6\' 3"  (1.905 m)    Wt 252 lb (114.3 kg)    SpO2 98% Comment: room air   BMI 31.50 kg/m     General Appearance:    Mildly obese male. Alert, cooperative, in no acute distress, appears stated age  Head:    Normocephalic, without obvious abnormality, atraumatic  Eyes:    PERRL, conjunctiva/corneas clear, EOM's intact, fundi    benign, both eyes       Ears:    Normal TM's and external ear canals, both ears  Nose:   Nares normal, septum midline, mucosa normal, no drainage   or sinus tenderness  Back:     Symmetric, no curvature, ROM normal, no CVA tenderness  Lungs:     Clear to auscultation bilaterally, respirations unlabored  Chest wall:    No tenderness or deformity  Heart:    Normal heart rate. Irregularly irregular rhythm. No murmurs, rubs, or gallops.  S1 and S2 normal  Abdomen:     Soft, non-tender, bowel sounds active all four quadrants,    no masses, no organomegaly  Genitalia:    deferred  Rectal:    deferred  Extremities:   All extremities are intact. No cyanosis or edema  Pulses:   2+ and symmetric all extremities  Skin:   Skin color, texture, turgor normal, no rashes or lesions  Lymph nodes:   Cervical, supraclavicular, and axillary nodes normal  Neurologic:   CNII-XII intact. Normal strength, sensation and reflexes      throughout     Last depression screening scores PHQ 2/9 Scores 11/22/2021 11/16/2020 10/15/2019  PHQ - 2 Score 0 0 0  PHQ- 9 Score - - -   Last fall risk screening Fall Risk  11/22/2021  Falls in the past year? 1  Number falls in past yr: 0  Injury with Fall? 0  Risk for fall due to : History of fall(s)  Follow up Falls prevention discussed   Last Audit-C  alcohol use screening Alcohol Use Disorder Test (AUDIT) 11/22/2021  1. How often do you have a drink containing alcohol? 4  2. How many drinks containing alcohol do you have on a typical day when you are drinking? 0  3. How often do you have six or more drinks on one occasion? 0  AUDIT-C Score 4  4. How often during the last year have you found that you were not able to stop drinking once you had started? 0  5. How often during the last year have you failed to do what was normally expected from you because of drinking? 0  6. How often during the last year have you needed a first drink in the morning to get yourself going after a heavy drinking session? 0  7. How often during the last year have you had a feeling of guilt of remorse after drinking? 0  8. How often during the last year have you been unable to remember what happened the night before  because you had been drinking? 0  9. Have you or someone else been injured as a result of your drinking? 0  10. Has a relative or friend or a doctor or another health worker been concerned about your drinking or suggested you cut down? 0  Alcohol Use Disorder Identification Test Final Score (AUDIT) 4  Alcohol Brief Interventions/Follow-up -   A score of 3 or more in women, and 4 or more in men indicates increased risk for alcohol abuse, EXCEPT if all of the points are from question 1   No results found for any visits on 11/22/21.  Assessment & Plan    Routine Health Maintenance and Physical Exam  Exercise Activities and Dietary recommendations  Goals      DIET - EAT MORE FRUITS AND VEGETABLES     DIET - INCREASE WATER INTAKE     Recommend increasing water intake to 6-8 8 oz glasses a day.         Immunization History  Administered Date(s) Administered   Fluad Quad(high Dose 65+) 06/22/2019   Influenza, High Dose Seasonal PF 06/08/2020, 07/07/2021   Influenza-Unspecified 06/25/2017, 06/22/2018   PFIZER(Purple Top)SARS-COV-2 Vaccination  11/23/2019, 12/14/2019, 07/19/2020   Pneumococcal Conjugate-13 09/20/2014   Pneumococcal Polysaccharide-23 09/26/2015   Tdap 03/27/2011   Zoster Recombinat (Shingrix) 10/14/2018, 12/22/2018   Zoster, Live 03/24/2010    Health Maintenance  Topic Date Due   COVID-19 Vaccine (4 - Booster for Pfizer series) 09/13/2020   TETANUS/TDAP  03/26/2021   COLONOSCOPY (Pts 45-72yrs Insurance coverage will need to be confirmed)  05/29/2023   Pneumonia Vaccine 9+ Years old  Completed   INFLUENZA VACCINE  Completed   Hepatitis C Screening  Completed   Zoster Vaccines- Shingrix  Completed   HPV VACCINES  Aged Out    Discussed health benefits of physical activity, and encouraged him to engage in regular exercise appropriate for his age and condition.  1. Annual physical exam Doing well.   2. Essential hypertension Well controlled.  Continue current medications.   - CBC - Comprehensive metabolic panel - Lipid panel  3. 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 Permanent atrial fibrillation (HCC) CHA2DS2-VASc Score 3; On Xarelto Rate controlled, on DOAC. Continue routine cardiology follow up.   5. Hyperglycemia  - Hemoglobin A1c  6. Prescription for Tdap. Vaccine not administered in office.   - Tdap ([place]) 5-2.5-18.5 LF-MCG/0.5 injection; Inject 0.5 mLs into the muscle once for 1 dose.  Dispense: 0.5 mL; Refill: 0       The entirety of the information documented in the History of Present Illness, Review of Systems and Physical Exam were personally obtained by me. Portions of this information were initially documented by the CMA and reviewed by me for thoroughness and accuracy.     [name], MD  [facility] [phone] (phone) [phone] (fax)  [place]

## 2021-11-22 NOTE — Patient Instructions (Signed)
[name] , Thank you for taking time to come for your Medicare Wellness Visit. I appreciate your ongoing commitment to your health goals. Please review the following plan we discussed and let me know if I can assist you in the future.   Screening recommendations/referrals: Colonoscopy: 04/16/17 Recommended yearly ophthalmology/optometry visit for glaucoma screening and checkup Recommended yearly dental visit for hygiene and checkup  Vaccinations: Influenza vaccine: had at local pharmacy Pneumococcal vaccine: 09/20/14 Tdap vaccine: 03/27/11 Shingles vaccine: Shingrix 10/14/18, 12/22/18  zostavax 03/24/10 Covid-19: 11/23/19, 12/14/19, 07/19/20  Advanced directives: no  Conditions/risks identified: none  Next appointment: Follow up in one year for your annual wellness visit. 11/26/22 @ 9am in person  Preventive Care 65 Years and Older, Male Preventive care refers to lifestyle choices and visits with your health care provider that can promote health and wellness. What does preventive care include? A yearly physical exam. This is also called an annual well check. Dental exams once or twice a year. Routine eye exams. Ask your health care provider how often you should have your eyes checked. Personal lifestyle choices, including: Daily care of your teeth and gums. Regular physical activity. Eating a healthy diet. Avoiding tobacco and drug use. Limiting alcohol use. Practicing safe sex. Taking low doses of aspirin every day. Taking vitamin and mineral supplements as recommended by your health care provider. What happens during an annual well check? The services and screenings done by your health care provider during your annual well check will depend on your age, overall health, lifestyle risk factors, and family history of disease. Counseling  Your health care provider may ask you questions about your: Alcohol use. Tobacco use. Drug use. Emotional well-being. Home and relationship  well-being. Sexual activity. Eating habits. History of falls. Memory and ability to understand (cognition). Work and work Statistician. Screening  You may have the following tests or measurements: Height, weight, and BMI. Blood pressure. Lipid and cholesterol levels. These may be checked every 5 years, or more frequently if you are over 31 years old. Skin check. Lung cancer screening. You may have this screening every year starting at age 55 if you have a 30-pack-year history of smoking and currently smoke or have quit within the past 15 years. Fecal occult blood test (FOBT) of the stool. You may have this test every year starting at age 52. Flexible sigmoidoscopy or colonoscopy. You may have a sigmoidoscopy every 5 years or a colonoscopy every 10 years starting at age 9. Prostate cancer screening. Recommendations will vary depending on your family history and other risks. Hepatitis C blood test. Hepatitis B blood test. Sexually transmitted disease (STD) testing. Diabetes screening. This is done by checking your blood sugar (glucose) after you have not eaten for a while (fasting). You may have this done every 1-3 years. Abdominal aortic aneurysm (AAA) screening. You may need this if you are a current or former smoker. Osteoporosis. You may be screened starting at age 49 if you are at high risk. Talk with your health care provider about your test results, treatment options, and if necessary, the need for more tests. Vaccines  Your health care provider may recommend certain vaccines, such as: Influenza vaccine. This is recommended every year. Tetanus, diphtheria, and acellular pertussis (Tdap, Td) vaccine. You may need a Td booster every 10 years. Zoster vaccine. You may need this after age 48. Pneumococcal 13-valent conjugate (PCV13) vaccine. One dose is recommended after age 18. Pneumococcal polysaccharide (PPSV23) vaccine. One dose is recommended after age 77.  Talk to your health care  provider about which screenings and vaccines you need and how often you need them. This information is not intended to replace advice given to you by your health care provider. Make sure you discuss any questions you have with your health care provider. Document Released: 10/21/2015 Document Revised: 06/13/2016 Document Reviewed: 07/26/2015 Elsevier Interactive Patient Education  2017 [place] Prevention in the Home Falls can cause injuries. They can happen to people of all ages. There are many things you can do to make your home safe and to help prevent falls. What can I do on the outside of my home? Regularly fix the edges of walkways and driveways and fix any cracks. Remove anything that might make you trip as you walk through a door, such as a raised step or threshold. Trim any bushes or trees on the path to your home. Use bright outdoor lighting. Clear any walking paths of anything that might make someone trip, such as rocks or tools. Regularly check to see if handrails are loose or broken. Make sure that both sides of any steps have handrails. Any raised decks and porches should have guardrails on the edges. Have any leaves, snow, or ice cleared regularly. Use sand or salt on walking paths during winter. Clean up any spills in your garage right away. This includes oil or grease spills. What can I do in the bathroom? Use night lights. Install grab bars by the toilet and in the tub and shower. Do not use towel bars as grab bars. Use non-skid mats or decals in the tub or shower. If you need to sit down in the shower, use a plastic, non-slip stool. Keep the floor dry. Clean up any water that spills on the floor as soon as it happens. Remove soap buildup in the tub or shower regularly. Attach bath mats securely with double-sided non-slip rug tape. Do not have throw rugs and other things on the floor that can make you trip. What can I do in the bedroom? Use night lights. Make  sure that you have a light by your bed that is easy to reach. Do not use any sheets or blankets that are too big for your bed. They should not hang down onto the floor. Have a firm chair that has side arms. You can use this for support while you get dressed. Do not have throw rugs and other things on the floor that can make you trip. What can I do in the kitchen? Clean up any spills right away. Avoid walking on wet floors. Keep items that you use a lot in easy-to-reach places. If you need to reach something above you, use a strong step stool that has a grab bar. Keep electrical cords out of the way. Do not use floor polish or wax that makes floors slippery. If you must use wax, use non-skid floor wax. Do not have throw rugs and other things on the floor that can make you trip. What can I do with my stairs? Do not leave any items on the stairs. Make sure that there are handrails on both sides of the stairs and use them. Fix handrails that are broken or loose. Make sure that handrails are as long as the stairways. Check any carpeting to make sure that it is firmly attached to the stairs. Fix any carpet that is loose or worn. Avoid having throw rugs at the top or bottom of the stairs. If you do have throw  rugs, attach them to the floor with carpet tape. Make sure that you have a light switch at the top of the stairs and the bottom of the stairs. If you do not have them, ask someone to add them for you. What else can I do to help prevent falls? Wear shoes that: Do not have high heels. Have rubber bottoms. Are comfortable and fit you well. Are closed at the toe. Do not wear sandals. If you use a stepladder: Make sure that it is fully opened. Do not climb a closed stepladder. Make sure that both sides of the stepladder are locked into place. Ask someone to hold it for you, if possible. Clearly mark and make sure that you can see: Any grab bars or handrails. First and last steps. Where the  edge of each step is. Use tools that help you move around (mobility aids) if they are needed. These include: Canes. Walkers. Scooters. Crutches. Turn on the lights when you go into a dark area. Replace any light bulbs as soon as they burn out. Set up your furniture so you have a clear path. Avoid moving your furniture around. If any of your floors are uneven, fix them. If there are any pets around you, be aware of where they are. Review your medicines with your doctor. Some medicines can make you feel dizzy. This can increase your chance of falling. Ask your doctor what other things that you can do to help prevent falls. This information is not intended to replace advice given to you by your health care provider. Make sure you discuss any questions you have with your health care provider. Document Released: 07/21/2009 Document Revised: 03/01/2016 Document Reviewed: 10/29/2014 Elsevier Interactive Patient Education  2017 [employer].

## 2021-11-22 NOTE — Telephone Encounter (Signed)
VM was full so I couldn't leave a message

## 2021-11-22 NOTE — Progress Notes (Signed)
Subjective:   [name] is a 76 y.o. male who presents for Medicare Annual/Subsequent preventive examination.  Review of Systems           Objective:    There were no vitals filed for this visit. There is no height or weight on file to calculate BMI.  Advanced Directives 11/16/2020 10/15/2019 10/03/2017 05/30/2015 08/19/2014 08/17/2014 07/23/2014  Does Patient Have a Medical Advance Directive? Yes Yes No No No Yes No  Type of Paramedic of [place];Living will [place];Living will - - - Living will -  Does patient want to make changes to medical advance directive? - - - - - No - Patient declined -  Copy of [place] in Chart? No - copy requested No - copy requested - - - - -  Would patient like information on creating a medical advance directive? - - No - Patient declined No - patient declined information - - No - patient declined information    Current Medications (verified) Outpatient Encounter Medications as of 11/22/2021  Medication Sig   carvedilol (COREG) 12.5 MG tablet TAKE 1 TABLET BY MOUTH TWICE A DAY   chlorthalidone (HYGROTON) 25 MG tablet TAKE 1 TABLET BY MOUTH ONCE DAILY IN THEMORNING   furosemide (LASIX) 20 MG tablet Take 1 tablet (20 mg total) by mouth daily as needed for edema (UP TO 3X QWEEK ONLY).   Multiple Vitamin (MULTIVITAMIN) tablet Take 1 tablet by mouth daily.   rivaroxaban (XARELTO) 20 MG TABS tablet TAKE 1 TABLET BY MOUTH ONCE DAILY WITH SUPPER   valsartan (DIOVAN) 160 MG tablet TAKE 1 TABLET BY MOUTH ONCE DAILY   No facility-administered encounter medications on file as of 11/22/2021.    Allergies (verified) Benazepril   History: Past Medical History:  Diagnosis Date   Basal cell carcinoma of right lower extremity 03/24/2015   BPH with obstruction/lower urinary tract symptoms    Chronic atrial fibrillation (HCC)    Dislocation of shoulder, anterior, right, closed 07/24/2014    Echocardiogram abnormal    2009 moderate to severely dilated left atrium   Erectile dysfunction    Essential hypertension    Gout    History of chicken pox    History of measles    Hyperglycemia    Hypogonadism in male    Nocturia    Peyronie's disease    Traumatic tear of right rotator cuff 07/24/2014   Venous stasis    chronic   Past Surgical History:  Procedure Laterality Date   CARDIAC CATHETERIZATION  03/28/2004   normal coronaries, reduced EF at 25-30% (Dr. Gerrie Nordmann)   [place], TEE guided  03/29/2004   Dr. Gerrie Nordmann    COLONOSCOPY  05/28/2013   Dr. Rayann Heman   HERNIA REPAIR  age 61   [place]  age 54   MOHS SURGERY Right 08/20/2019   ALA done by Ishmael Holter, MD [facility]   MYOVIEW CARDIOVASCULAR STRESS TEST  07/2003   anterior wall thinning, LV systolic function depressed at 33%   PILONIDAL CYST EXCISION     Right Eyebrow Tumor Removed  age 88    SHOULDER ARTHROSCOPY WITH ROTATOR CUFF REPAIR AND SUBACROMIAL DECOMPRESSION Right 08/19/2014   Procedure: SHOULDER ARTHROSCOPY WITH ROTATOR CUFF REPAIR AND SUBACROMIAL DECOMPRESSION;  Surgeon: Lorn Junes, MD;  Location: [facility];  Service: Orthopedics;  Laterality: Right;   TOTAL HIP ARTHROPLASTY Right 03/25/2006   Dr. Wanda Plump.  [place]   TRANSTHORACIC ECHOCARDIOGRAM  08/2008   EF=>55%, mild-mod dilated; LA mod-severely dilated; RA mild-mod dilated; mild calcif of MV, borderline MVP, trace MR; trace TR; mild pulm valve regurg    Family History  Problem Relation Age of Onset   Hypertension Father    Prostate cancer Father    Bladder Cancer Father    Hypertension Mother    Stroke Maternal Grandfather    Diabetes Paternal Grandfather    Supraventricular tachycardia Brother    Arthritis Sister        RA   Diabetes Maternal Uncle        TYPE 2   Supraventricular tachycardia Child    Kidney disease Neg Hx    Heart attack Neg Hx    Colon cancer Neg Hx    Social History    Socioeconomic History   Marital status: Divorced    Spouse name: Not on file   Number of children: 1   Years of education: Not on file   Highest education level: Master's degree (e.g., MA, MS, MEng, MEd, MSW, MBA)  Occupational History   Occupation: retired Pharmacist, hospital, taught PE and coached  Tobacco Use   Smoking status: Never   Smokeless tobacco: Never  Vaping Use   Vaping Use: Never used  Substance and Sexual Activity   Alcohol use: Yes    Alcohol/week: 4.0 standard drinks    Types: 4 Cans of beer per week   Drug use: No   Sexual activity: Not on file  Other Topics Concern   Not on file  Social History Narrative   Divorced father of 1. Retired Education officer, museum.   ~2 beers / day. Does not smoke.   Works out 6 days a weeks - cardio & weights. He used to run and play basketball prior to his hip surgery in 2007.   Currently dating.   Social Determinants of Health   Financial Resource Strain: Not on file  Food Insecurity: Not on file  Transportation Needs: Not on file  Physical Activity: Not on file  Stress: Not on file  Social Connections: Not on file    Tobacco Counseling Counseling given: Not Answered   Clinical Intake:  Pre-visit preparation completed: Yes  Pain : No/denies pain     Diabetes: No  How often do you need to have someone help you when you read instructions, pamphlets, or other written materials from your doctor or pharmacy?: 1 - Never  Diabetic?no  Interpreter Needed?: No  Information entered by :: Kirke Shaggy, LPN   Activities of Daily Living No flowsheet data found.  Patient Care Team: Birdie Sons, MD as PCP - General (Family Medicine) Leonie Man, MD as PCP - Cardiology (Cardiology) Dasher, Rayvon Char, MD (Dermatology) Nori Riis, PA-C as Physician Assistant (Urology) System, Provider Not In  Indicate any recent Medical Services you may have received from other than [facility] providers in the past year (date may be  approximate).     Assessment:   This is a routine wellness examination for [name]  Hearing/Vision screen No results found.  Dietary issues and exercise activities discussed:     Goals Addressed   None    Depression Screen PHQ 2/9 Scores 11/16/2020 10/15/2019 10/15/2019 10/06/2018 10/03/2017 10/03/2017 09/26/2016  PHQ - 2 Score 0 0 0 0 0 0 0  PHQ- 9 Score - - - 0 0 - -    Fall Risk Fall Risk  11/16/2020 10/15/2019 10/06/2018 10/03/2017 09/26/2016  Falls in the  past year? 0 1 1 No No  Number falls in past yr: 0 0 1 - -  Injury with Fall? 0 0 0 - -  Follow up - - Falls prevention discussed - -    FALL RISK PREVENTION PERTAINING TO THE HOME:  Any stairs in or around the home? No  If so, are there any without handrails? No  Home free of loose throw rugs in walkways, pet beds, electrical cords, etc? Yes  Adequate lighting in your home to reduce risk of falls? Yes   ASSISTIVE DEVICES UTILIZED TO PREVENT FALLS:  Life alert? No  Use of a cane, walker or w/c? No  Grab bars in the bathroom? No  Shower chair or bench in shower? No  Elevated toilet seat or a handicapped toilet? Yes   TIMED UP AND GO:  Was the test performed? Yes .  Length of time to ambulate 10 feet: 4 sec.   Gait steady and fast without use of assistive device  Cognitive Function:        Immunizations Immunization History  Administered Date(s) Administered   Fluad Quad(high Dose 65+) 06/22/2019   Influenza, High Dose Seasonal PF 06/08/2020   Influenza-Unspecified 06/25/2017, 06/22/2018   PFIZER(Purple Top)SARS-COV-2 Vaccination 11/23/2019, 12/14/2019, 07/19/2020   Pneumococcal Conjugate-13 09/20/2014   Pneumococcal Polysaccharide-23 09/26/2015   Tdap 03/27/2011   Zoster Recombinat (Shingrix) 10/14/2018, 12/22/2018   Zoster, Live 03/24/2010    TDAP status: Due, Education has been provided regarding the importance of this vaccine. Advised may receive this vaccine at local pharmacy or Health Dept. Aware  to provide a copy of the vaccination record if obtained from local pharmacy or Health Dept. Verbalized acceptance and understanding.  Flu Vaccine status: Up to date  Pneumococcal vaccine status: Up to date  Covid-19 vaccine status: Completed vaccines  Qualifies for Shingles Vaccine? Yes   Zostavax completed Yes   Shingrix Completed?: Yes  Screening Tests Health Maintenance  Topic Date Due   COVID-19 Vaccine (4 - Booster for Pfizer series) 09/13/2020   TETANUS/TDAP  03/26/2021   INFLUENZA VACCINE  05/08/2021   COLONOSCOPY (Pts 45-27yrs Insurance coverage will need to be confirmed)  05/29/2023   Pneumonia Vaccine 46+ Years old  Completed   Hepatitis C Screening  Completed   Zoster Vaccines- Shingrix  Completed   HPV VACCINES  Aged Out    Health Maintenance  Health Maintenance Due  Topic Date Due   COVID-19 Vaccine (4 - Booster for Pfizer series) 09/13/2020   TETANUS/TDAP  03/26/2021   INFLUENZA VACCINE  05/08/2021    Colorectal cancer screening: Type of screening: Colonoscopy. Completed 04/16/17. Repeat every 10 years- aged out  Lung Cancer Screening: (Low Dose CT Chest recommended if Age 64-80 years, 30 pack-year currently smoking OR have quit w/in 15years.) does not qualify.    Additional Screening:  Hepatitis C Screening:  qualify; Completed 6/21/13does  Vision Screening: Recommended annual ophthalmology exams for early detection of glaucoma and other disorders of the eye. Is the patient up to date with their annual eye exam?  Yes  Who is the provider or what is the name of the office in which the patient attends annual eye exams? [facility] If pt is not established with a provider, would they like to be referred to a provider to establish care? No .   Dental Screening: Recommended annual dental exams for proper oral hygiene  Community Resource Referral / Chronic Care Management: CRR required this visit?  No   CCM required  this visit?  No      Plan:      I have personally reviewed and noted the following in the patients chart:   Medical and social history Use of alcohol, tobacco or illicit drugs  Current medications and supplements including opioid prescriptions. Patient is not currently taking opioid prescriptions. Functional ability and status Nutritional status Physical activity Advanced directives List of other physicians Hospitalizations, surgeries, and ER visits in previous 12 months Vitals Screenings to include cognitive, depression, and falls Referrals and appointments  In addition, I have reviewed and discussed with patient certain preventive protocols, quality metrics, and best practice recommendations. A written personalized care plan for preventive services as well as general preventive health recommendations were provided to patient.     Dionisio David, LPN   [id number]/46/8032   Nurse Notes: none

## 2021-11-23 LAB — LIPID PANEL
Chol/HDL Ratio: 2.7 ratio (ref 0.0–5.0)
Cholesterol, Total: 160 mg/dL (ref 100–199)
HDL: 60 mg/dL (ref >39)
LDL Chol Calc (NIH): 87 mg/dL (ref 0–99)
Triglycerides: 68 mg/dL (ref 0–149)
VLDL Cholesterol Cal: 13 mg/dL (ref 5–40)

## 2021-11-23 LAB — COMPREHENSIVE METABOLIC PANEL
ALT: 17 IU/L (ref 0–44)
AST: 19 IU/L (ref 0–40)
Albumin/Globulin Ratio: 1.8 (ref 1.2–2.2)
Albumin: 4.6 g/dL (ref 3.7–4.7)
Alkaline Phosphatase: 101 IU/L (ref 44–121)
BUN/Creatinine Ratio: 12 (ref 10–24)
BUN: 11 mg/dL (ref 8–27)
Bilirubin Total: 1 mg/dL (ref 0.0–1.2)
CO2: 27 mmol/L (ref 20–29)
Calcium: 10.3 mg/dL — ABNORMAL HIGH (ref 8.6–10.2)
Chloride: 94 mmol/L — ABNORMAL LOW (ref 96–106)
Creatinine, Ser: 0.95 mg/dL (ref 0.76–1.27)
Globulin, Total: 2.6 g/dL (ref 1.5–4.5)
Glucose: 124 mg/dL — ABNORMAL HIGH (ref 70–99)
Potassium: 4.6 mmol/L (ref 3.5–5.2)
Sodium: 139 mmol/L (ref 134–144)
Total Protein: 7.2 g/dL (ref 6.0–8.5)
eGFR: 85 mL/min/{1.73_m2} (ref >59)

## 2021-11-23 LAB — CBC
Hematocrit: 40.5 % (ref 37.5–51.0)
Hemoglobin: 14.3 g/dL (ref 13.0–17.7)
MCH: 32.4 pg (ref 26.6–33.0)
MCHC: 35.3 g/dL (ref 31.5–35.7)
MCV: 92 fL (ref 79–97)
Platelets: 266 10*3/uL (ref 150–450)
RBC: 4.42 x10E6/uL (ref 4.14–5.80)
RDW: 11.7 % (ref 11.6–15.4)
WBC: 7.7 10*3/uL (ref 3.4–10.8)

## 2021-11-23 LAB — HEMOGLOBIN A1C
Est. average glucose Bld gHb Est-mCnc: 120 mg/dL
Hgb A1c MFr Bld: 5.8 % — ABNORMAL HIGH (ref 4.8–5.6)

## 2021-11-28 DIAGNOSIS — L905 Scar conditions and fibrosis of skin: Secondary | ICD-10-CM | POA: Diagnosis not present

## 2021-11-28 DIAGNOSIS — C44629 Squamous cell carcinoma of skin of left upper limb, including shoulder: Secondary | ICD-10-CM | POA: Diagnosis not present

## 2021-12-07 ENCOUNTER — Encounter: Payer: Medicare PPO | Attending: Physician Assistant | Admitting: Physician Assistant

## 2021-12-07 ENCOUNTER — Other Ambulatory Visit (INDEPENDENT_AMBULATORY_CARE_PROVIDER_SITE_OTHER): Payer: Self-pay | Admitting: Physician Assistant

## 2021-12-07 ENCOUNTER — Other Ambulatory Visit: Payer: Self-pay

## 2021-12-07 DIAGNOSIS — I10 Essential (primary) hypertension: Secondary | ICD-10-CM | POA: Diagnosis not present

## 2021-12-07 DIAGNOSIS — L97812 Non-pressure chronic ulcer of other part of right lower leg with fat layer exposed: Secondary | ICD-10-CM | POA: Insufficient documentation

## 2021-12-07 DIAGNOSIS — E1151 Type 2 diabetes mellitus with diabetic peripheral angiopathy without gangrene: Secondary | ICD-10-CM | POA: Insufficient documentation

## 2021-12-07 DIAGNOSIS — Z7901 Long term (current) use of anticoagulants: Secondary | ICD-10-CM | POA: Diagnosis not present

## 2021-12-07 DIAGNOSIS — L97822 Non-pressure chronic ulcer of other part of left lower leg with fat layer exposed: Secondary | ICD-10-CM | POA: Insufficient documentation

## 2021-12-07 DIAGNOSIS — I480 Paroxysmal atrial fibrillation: Secondary | ICD-10-CM | POA: Diagnosis not present

## 2021-12-07 DIAGNOSIS — I872 Venous insufficiency (chronic) (peripheral): Secondary | ICD-10-CM | POA: Diagnosis not present

## 2021-12-07 DIAGNOSIS — E11622 Type 2 diabetes mellitus with other skin ulcer: Secondary | ICD-10-CM | POA: Insufficient documentation

## 2021-12-07 NOTE — Progress Notes (Addendum)
[name], [name] ([id number]) Visit Report for 12/07/2021 Allergy List Details Patient Name: [name], [name] Date of Service: 12/07/2021 10:00 AM Medical Record Number: [id number] Patient Account Number: [id number] Date of Birth/Sex: 10/15/1945 (76 y.o. M) Treating RN: Levora Dredge Primary Care Cabot Cromartie: [name] Other Clinician: Referring Ona Roehrs: [name] Treating Tarris Delbene/Extender: Jeri Cos Weeks in Treatment: 0 Allergies Active Allergies No Known Allergies Allergy Notes Electronic Signature(s) Signed: 12/07/2021 4:37:42 PM By: Levora Dredge Entered By: Levora Dredge on 12/07/2021 10:12:55 [name], [name] -------------------------------------------------------------------------------- Arrival Information Details Patient Name: [name], [name] Date of Service: 12/07/2021 10:00 AM Medical Record Number: [id number] Patient Account Number: [id number] Date of Birth/Sex: [birth year] (76 y.o. M) Treating RN: Levora Dredge Primary Care Ortencia Askari: [name] Other Clinician: Referring Honorio Devol: [name] Treating Ariah Mower/Extender: Skipper Cliche in Treatment: 0 Visit Information Patient Arrived: Ambulatory Arrival Time: 10:09 Accompanied By: self Transfer Assistance: None Patient Identification Verified: Yes Secondary Verification Process Completed: Yes Patient Requires Transmission-Based No Precautions: Patient Has Alerts: Yes Patient Alerts: Patient on Blood Thinner NOT diabetic Xarelto AVVS ABI 12/13/21 [place] TBI L 0.72; R 0.75 History Since Last Visit Added or deleted any medications: No Any new allergies or adverse reactions: No Had a fall or experienced change in activities of daily living that may affect risk of falls: No Hospitalized since last visit: No Has Dressing in Place as Prescribed: No Electronic Signature(s) Signed: 12/13/2021 3:11:20 PM By: Donnamarie Poag Previous Signature: 12/07/2021 10:53:04 AM Version By: Donnamarie Poag Entered By: Donnamarie Poag on 12/13/2021 15:11:19 [name] ([id number]) -------------------------------------------------------------------------------- Clinic Level of Care Assessment Details Patient Name: [name] Nevin R. Date of Service: 12/07/2021 10:00 AM Medical Record Number: [id number] Patient Account Number: [id number] Date of Birth/Sex: 07-17-1946 (76 y.o. M) Treating RN: Donnamarie Poag Primary Care Ana Woodroof: [name] Other Clinician: Referring Antiono Ettinger: [name] Treating Manvi Guilliams/Extender: Skipper Cliche in Treatment: 0 Clinic Level of Care Assessment Items TOOL 2 Quantity Score []  - Use when only an EandM is performed on the INITIAL visit 0 ASSESSMENTS - Nursing Assessment / Reassessment []  - General Physical Exam (combine w/ comprehensive assessment (listed just below) when performed on new 0 pt. evals) []  - 0 Comprehensive Assessment (HX, ROS, Risk Assessments, Wounds Hx, etc.) ASSESSMENTS - Wound and Skin Assessment / Reassessment []  - Simple Wound Assessment / Reassessment - one wound 0 X- 3 5 Complex Wound Assessment / Reassessment - multiple wounds []  - 0 Dermatologic / Skin Assessment (not related to wound area) ASSESSMENTS - Ostomy and/or Continence Assessment and Care []  - Incontinence Assessment and Management 0 []  - 0 Ostomy Care Assessment and Management (repouching, etc.) PROCESS - Coordination of Care X - Simple Patient / Family Education for ongoing care 1 15 []  - 0 Complex (extensive) Patient / Family Education for ongoing care []  - 0 Staff obtains Programmer, systems, Records, Test Results / Process Orders []  - 0 Staff telephones HHA, Nursing Homes / Clarify orders / etc []  - 0 Routine Transfer to another Facility (non-emergent condition) []  - 0 Routine Hospital Admission (non-emergent condition) X- 1 15 New Admissions / Biomedical engineer / Ordering NPWT, Apligraf, etc. []  - 0 Emergency Hospital Admission (emergent  condition) X- 1 10 Simple Discharge Coordination []  - 0 Complex (extensive) Discharge Coordination PROCESS - Special Needs []  - Pediatric / Minor Patient Management 0 []  - 0 Isolation Patient Management []  - 0 Hearing / Language / Visual special needs []  - 0 Assessment of Community assistance (transportation, [name], etc.) []  -  [name], [name] ([id number]) Visit Report for 12/07/2021 Allergy List Details Patient Name: [name], [name] Date of Service: 12/07/2021 10:00 AM Medical Record Number: [id number] Patient Account Number: [id number] Date of Birth/Sex: 10/15/1945 (76 y.o. M) Treating RN: Levora Dredge Primary Care [place]: [name] Other Clinician: Referring Ona Roehrs: [name] Treating Tarris Delbene/Extender: Jeri Cos Weeks in Treatment: 0 Allergies Active Allergies No Known Allergies Allergy Notes Electronic Signature(s) Signed: 12/07/2021 4:37:42 PM By: Levora Dredge Entered By: Levora Dredge on 12/07/2021 10:12:55 [name], [name] -------------------------------------------------------------------------------- Arrival Information Details Patient Name: [name], [name] Date of Service: 12/07/2021 10:00 AM Medical Record Number: [id number] Patient Account Number: [id number] Date of Birth/Sex: [birth year] (76 y.o. M) Treating RN: Levora Dredge Primary Care Ortencia Askari: [name] Other Clinician: Referring Honorio Devol: [name] Treating Ariah Mower/Extender: Skipper Cliche in Treatment: 0 Visit Information Patient Arrived: Ambulatory Arrival Time: 10:09 Accompanied By: self Transfer Assistance: None Patient Identification Verified: Yes Secondary Verification Process Completed: Yes Patient Requires Transmission-Based No Precautions: Patient Has Alerts: Yes Patient Alerts: Patient on Blood Thinner NOT diabetic Xarelto AVVS ABI 12/13/21 [place] TBI L 0.72; R 0.75 History Since Last Visit Added or deleted any medications: No Any new allergies or adverse reactions: No Had a fall or experienced change in activities of daily living that may affect risk of falls: No Hospitalized since last visit: No Has Dressing in Place as Prescribed: No Electronic Signature(s) Signed: 12/13/2021 3:11:20 PM By: Donnamarie Poag Previous Signature: 12/07/2021 10:53:04 AM Version By: Donnamarie Poag Entered By: Donnamarie Poag on 12/13/2021 15:11:19 [name] ([id number]) -------------------------------------------------------------------------------- Clinic Level of Care Assessment Details Patient Name: [name] Nevin R. Date of Service: 12/07/2021 10:00 AM Medical Record Number: [id number] Patient Account Number: [id number] Date of Birth/Sex: 07-17-1946 (76 y.o. M) Treating RN: Donnamarie Poag Primary Care Ana Woodroof: [name] Other Clinician: Referring Antiono Ettinger: [name] Treating Manvi Guilliams/Extender: Skipper Cliche in Treatment: 0 Clinic Level of Care Assessment Items TOOL 2 Quantity Score []  - Use when only an EandM is performed on the INITIAL visit 0 ASSESSMENTS - Nursing Assessment / Reassessment []  - General Physical Exam (combine w/ comprehensive assessment (listed just below) when performed on new 0 pt. evals) []  - 0 Comprehensive Assessment (HX, ROS, Risk Assessments, Wounds Hx, etc.) ASSESSMENTS - Wound and Skin Assessment / Reassessment []  - Simple Wound Assessment / Reassessment - one wound 0 X- 3 5 Complex Wound Assessment / Reassessment - multiple wounds []  - 0 Dermatologic / Skin Assessment (not related to wound area) ASSESSMENTS - Ostomy and/or Continence Assessment and Care []  - Incontinence Assessment and Management 0 []  - 0 Ostomy Care Assessment and Management (repouching, etc.) PROCESS - Coordination of Care X - Simple Patient / Family Education for ongoing care 1 15 []  - 0 Complex (extensive) Patient / Family Education for ongoing care []  - 0 Staff obtains Programmer, systems, Records, Test Results / Process Orders []  - 0 Staff telephones HHA, Nursing Homes / Clarify orders / etc []  - 0 Routine Transfer to another Facility (non-emergent condition) []  - 0 Routine Hospital Admission (non-emergent condition) X- 1 15 New Admissions / Biomedical engineer / Ordering NPWT, Apligraf, etc. []  - 0 Emergency Hospital Admission (emergent  condition) X- 1 10 Simple Discharge Coordination []  - 0 Complex (extensive) Discharge Coordination PROCESS - Special Needs []  - Pediatric / Minor Patient Management 0 []  - 0 Isolation Patient Management []  - 0 Hearing / Language / Visual special needs []  - 0 Assessment of Community assistance (transportation, [name], etc.) []  -  [name], [name] ([id number]) Visit Report for 12/07/2021 Allergy List Details Patient Name: [name], [name] Date of Service: 12/07/2021 10:00 AM Medical Record Number: [id number] Patient Account Number: [id number] Date of Birth/Sex: 10/15/1945 (76 y.o. M) Treating RN: Levora Dredge Primary Care [place]: [name] Other Clinician: Referring Ona Roehrs: [name] Treating Tarris Delbene/Extender: Jeri Cos Weeks in Treatment: 0 Allergies Active Allergies No Known Allergies Allergy Notes Electronic Signature(s) Signed: 12/07/2021 4:37:42 PM By: Levora Dredge Entered By: Levora Dredge on 12/07/2021 10:12:55 [name], [name] -------------------------------------------------------------------------------- Arrival Information Details Patient Name: [name], [name] Date of Service: 12/07/2021 10:00 AM Medical Record Number: [id number] Patient Account Number: [id number] Date of Birth/Sex: [birth year] (76 y.o. M) Treating RN: Levora Dredge Primary Care Ortencia Askari: [name] Other Clinician: Referring Honorio Devol: [name] Treating Ariah Mower/Extender: Skipper Cliche in Treatment: 0 Visit Information Patient Arrived: Ambulatory Arrival Time: 10:09 Accompanied By: self Transfer Assistance: None Patient Identification Verified: Yes Secondary Verification Process Completed: Yes Patient Requires Transmission-Based No Precautions: Patient Has Alerts: Yes Patient Alerts: Patient on Blood Thinner NOT diabetic Xarelto AVVS ABI 12/13/21 [place] TBI L 0.72; R 0.75 History Since Last Visit Added or deleted any medications: No Any new allergies or adverse reactions: No Had a fall or experienced change in activities of daily living that may affect risk of falls: No Hospitalized since last visit: No Has Dressing in Place as Prescribed: No Electronic Signature(s) Signed: 12/13/2021 3:11:20 PM By: Donnamarie Poag Previous Signature: 12/07/2021 10:53:04 AM Version By: Donnamarie Poag Entered By: Donnamarie Poag on 12/13/2021 15:11:19 [name] ([id number]) -------------------------------------------------------------------------------- Clinic Level of Care Assessment Details Patient Name: [name] Nevin R. Date of Service: 12/07/2021 10:00 AM Medical Record Number: [id number] Patient Account Number: [id number] Date of Birth/Sex: 07-17-1946 (76 y.o. M) Treating RN: Donnamarie Poag Primary Care Ana Woodroof: [name] Other Clinician: Referring Antiono Ettinger: [name] Treating Manvi Guilliams/Extender: Skipper Cliche in Treatment: 0 Clinic Level of Care Assessment Items TOOL 2 Quantity Score []  - Use when only an EandM is performed on the INITIAL visit 0 ASSESSMENTS - Nursing Assessment / Reassessment []  - General Physical Exam (combine w/ comprehensive assessment (listed just below) when performed on new 0 pt. evals) []  - 0 Comprehensive Assessment (HX, ROS, Risk Assessments, Wounds Hx, etc.) ASSESSMENTS - Wound and Skin Assessment / Reassessment []  - Simple Wound Assessment / Reassessment - one wound 0 X- 3 5 Complex Wound Assessment / Reassessment - multiple wounds []  - 0 Dermatologic / Skin Assessment (not related to wound area) ASSESSMENTS - Ostomy and/or Continence Assessment and Care []  - Incontinence Assessment and Management 0 []  - 0 Ostomy Care Assessment and Management (repouching, etc.) PROCESS - Coordination of Care X - Simple Patient / Family Education for ongoing care 1 15 []  - 0 Complex (extensive) Patient / Family Education for ongoing care []  - 0 Staff obtains Programmer, systems, Records, Test Results / Process Orders []  - 0 Staff telephones HHA, Nursing Homes / Clarify orders / etc []  - 0 Routine Transfer to another Facility (non-emergent condition) []  - 0 Routine Hospital Admission (non-emergent condition) X- 1 15 New Admissions / Biomedical engineer / Ordering NPWT, Apligraf, etc. []  - 0 Emergency Hospital Admission (emergent  condition) X- 1 10 Simple Discharge Coordination []  - 0 Complex (extensive) Discharge Coordination PROCESS - Special Needs []  - Pediatric / Minor Patient Management 0 []  - 0 Isolation Patient Management []  - 0 Hearing / Language / Visual special needs []  - 0 Assessment of Community assistance (transportation, [name], etc.) []  -  Primary Etiology: Venous Leg Ulcer Wound Location: Left, Distal, Medial Lower Leg Wound Status: Open Wounding Event: Gradually Appeared Comorbid History: Arrhythmia, Hypertension, Type II Diabetes, Gout Date Acquired: 11/09/2021 Weeks Of Treatment: 0 Clustered Wound: No Photos Wound Measurements Length: (cm) Width: (cm) Depth: (cm) Area: (cm) Volume: (cm) 0.6 % Reduction in Area: 0.3 % Reduction in Volume: 0.1 0.141 0.014 Wound Description Classification: Full Thickness Without Exposed Support Structures Exudate Amount: Medium Exudate Type: Serosanguineous Exudate Color: red, brown Foul Odor After Cleansing: No Slough/Fibrino Yes Wound Bed Granulation Amount: Small (1-33%) Exposed Structure Granulation Quality: Red Fascia Exposed: No Necrotic Amount: Large (67-100%) Fat Layer (Subcutaneous Tissue) Exposed: Yes Necrotic Quality: Eschar, Adherent Slough Tendon Exposed: No Muscle Exposed: No Joint Exposed: No Bone Exposed: No Treatment Notes Wound #5 (Lower Leg) Wound Laterality: Left, Medial, Distal Cleanser Soap and Water Discharge Instruction: Gently cleanse wound with antibacterial soap, rinse and pat dry prior to dressing wounds Wound Cleanser Discharge Instruction: Wash your hands with soap and water. Remove old dressing, discard into plastic bag and place into trash. Cleanse the wound with Wound Cleanser prior to applying a clean dressing using gauze  sponges, not tissues or cotton balls. Do not ALASDAIR, KLEVE R. ([id number]) scrub or use excessive force. Pat dry using gauze sponges, not tissue or cotton balls. Peri-Wound Care Topical Primary Dressing Silvercel Small 2x2 (in/in) Discharge Instruction: Apply Silvercel Small 2x2 (in/in) as instructed Secondary Dressing Zetuvit Plus Silicone Non-bordered 5x5 (in/in) Secured With Compression Wrap Profore Lite LF 3 Multilayer Compression Bandaging System Discharge Instruction: Apply 3 multi-layer wrap as prescribed. Compression Stockings Add-Ons Electronic Signature(s) Signed: 12/07/2021 3:50:59 PM By: Donnamarie Poag Entered By: Donnamarie Poag on 12/07/2021 10:34:17 [name] ([id number]) -------------------------------------------------------------------------------- Wound Assessment Details Patient Name: MATEO, OVERBECK R. Date of Service: 12/07/2021 10:00 AM Medical Record Number: [id number] Patient Account Number: [id number] Date of Birth/Sex: 16-Apr-1946 (76 y.o. M) Treating RN: Donnamarie Poag Primary Care Arline Ketter: [name] Other Clinician: Referring Kanyah Matsushima: [name] Treating Zoanne Newill/Extender: Skipper Cliche in Treatment: 0 Wound Status Wound Number: 6 Primary Etiology: Venous Leg Ulcer Wound Location: Left, Proximal, Medial Lower Leg Wound Status: Open Wounding Event: Gradually Appeared Comorbid History: Arrhythmia, Hypertension, Type II Diabetes, Gout Date Acquired: 11/09/2021 Weeks Of Treatment: 0 Clustered Wound: No Photos Wound Measurements Length: (cm) 2 Width: (cm) 2.3 Depth: (cm) 0.1 Area: (cm) 3.613 Volume: (cm) 0.361 % Reduction in Area: % Reduction in Volume: Tunneling: No Undermining: No Wound Description Classification: Full Thickness Without Exposed Support Structu Exudate Amount: Medium Exudate Type: Serosanguineous Exudate Color: red, brown res Foul Odor After Cleansing: No Slough/Fibrino Yes Wound Bed Granulation Amount: Small  (1-33%) Exposed Structure Granulation Quality: Red Fascia Exposed: No Necrotic Amount: Large (67-100%) Fat Layer (Subcutaneous Tissue) Exposed: Yes Necrotic Quality: Eschar, Adherent Slough Tendon Exposed: No Muscle Exposed: No Joint Exposed: No Bone Exposed: No Electronic Signature(s) Signed: 12/07/2021 3:50:59 PM By: Donnamarie Poag Entered By: Donnamarie Poag on 12/07/2021 10:36:26 [name], [name] ([id number]) -------------------------------------------------------------------------------- [place] Details Patient Name: [name] Nevin R. Date of Service: 12/07/2021 10:00 AM Medical Record Number: [id number] Patient Account Number: [id number] Date of Birth/Sex: [birth year] (76 y.o. M) Treating RN: Levora Dredge Primary Care Raeli Wiens: [name] Other Clinician: Referring Duana Benedict: [name] Treating Darnelle Derrick/Extender: Skipper Cliche in Treatment: 0 Vital Signs Time Taken: 10:10 Temperature (F): 98.2 Height (in): 75 Pulse (bpm): 51 Source: Stated Respiratory Rate (breaths/min): 18 Weight (lbs): 245 Blood Pressure (mmHg): 133/83 Source: Stated Reference Range: 80 - 120 mg / dl Body Mass Index (  Date of Service: 12/07/2021 10:00 AM Medical Record Number: [id number] Patient Account Number: [id number] Date of Birth/Sex: [birth year] (76 y.o. M) Treating RN: Levora Dredge Primary Care Jena Tegeler: [name] Other  Clinician: Referring Luis Sami: [name] Treating Fong Mccarry/Extender: Skipper Cliche in Treatment: 0 Active Problems Location of Pain Severity and Description of Pain Patient Has Paino No Site Locations Rate the pain. Current Pain Level: 0 Pain Management and Medication Current Pain Management: Electronic Signature(s) Signed: 12/07/2021 4:37:42 PM By: Levora Dredge Entered By: Levora Dredge on 12/07/2021 10:10:40 [name], [name] ([id number]) -------------------------------------------------------------------------------- Patient/Caregiver Education Details Patient Name: FADIL, MACMASTER R. Date of Service: 12/07/2021 10:00 AM Medical Record Number: [id number] Patient Account Number: [id number] Date of Birth/Gender: 10/28/1945 (76 y.o. M) Treating RN: Donnamarie Poag Primary Care Physician: [name] Other Clinician: Referring Physician: [name] Treating Physician/Extender: Skipper Cliche in Treatment: 0 Education Assessment Education Provided To: Patient Education Topics Provided Basic Hygiene: Welcome [facility]: Wound Debridement: Wound/Skin Impairment: Electronic Signature(s) Signed: 12/07/2021 3:50:59 PM By: Donnamarie Poag Entered By: Donnamarie Poag on 12/07/2021 11:08:16 [name], [name] ([id number]) -------------------------------------------------------------------------------- Wound Assessment Details Patient Name: [name], [name] R. Date of Service: 12/07/2021 10:00 AM Medical Record Number: [id number] Patient Account Number: [id number] Date of Birth/Sex: 04/10/1946 (76 y.o. M) Treating RN: Donnamarie Poag Primary Care Kaylani Fromme: [name] Other Clinician: Referring Mattalyn Anderegg: [name] Treating Aneliese Beaudry/Extender: Skipper Cliche in Treatment: 0 Wound Status Wound Number: 4 Primary Etiology: Venous Leg Ulcer Wound Location: Right, Medial Lower Leg Wound Status: Open Wounding Event: Gradually Appeared Comorbid History: Arrhythmia,  Hypertension, Type II Diabetes, Gout Date Acquired: 11/09/2021 Weeks Of Treatment: 0 Clustered Wound: No Photos Wound Measurements Length: (cm) 0.7 Width: (cm) 0.8 Depth: (cm) 0.1 Area: (cm) 0.44 Volume: (cm) 0.044 % Reduction in Area: % Reduction in Volume: Tunneling: No Undermining: No Wound Description Classification: Full Thickness Without Exposed Support Structures Exudate Amount: Medium Exudate Type: Serosanguineous Exudate Color: red, brown Foul Odor After Cleansing: No Wound Bed Granulation Amount: Small (1-33%) Exposed Structure Granulation Quality: Red Fascia Exposed: No Necrotic Amount: Large (67-100%) Fat Layer (Subcutaneous Tissue) Exposed: Yes Necrotic Quality: Eschar, Adherent Slough Tendon Exposed: No Muscle Exposed: No Joint Exposed: No Bone Exposed: No Treatment Notes Wound #4 (Lower Leg) Wound Laterality: Right, Medial Cleanser Soap and Water Discharge Instruction: Gently cleanse wound with antibacterial soap, rinse and pat dry prior to dressing wounds Wound Cleanser Discharge Instruction: Wash your hands with soap and water. Remove old dressing, discard into plastic bag and place into trash. Cleanse the wound with Wound Cleanser prior to applying a clean dressing using gauze sponges, not tissues or cotton balls. Do not [name], [name] R. ([id number]) scrub or use excessive force. Pat dry using gauze sponges, not tissue or cotton balls. Peri-Wound Care Topical Primary Dressing Silvercel Small 2x2 (in/in) Discharge Instruction: Apply Silvercel Small 2x2 (in/in) as instructed Secondary Dressing Zetuvit Plus Silicone Non-bordered 5x5 (in/in) Secured With Compression Wrap Profore Lite LF 3 Multilayer Compression Bandaging System Discharge Instruction: Apply 3 multi-layer wrap as prescribed. Compression Stockings Add-Ons Electronic Signature(s) Signed: 12/07/2021 3:50:59 PM By: Donnamarie Poag Entered By: Donnamarie Poag on 12/07/2021 10:32:53 [name], [name] ([id number]) -------------------------------------------------------------------------------- Wound Assessment Details Patient Name: [name], SNELSON R. Date of Service: 12/07/2021 10:00 AM Medical Record Number: [id number] Patient Account Number: [id number] Date of Birth/Sex: 03/12/1946 (76 y.o. M) Treating RN: Donnamarie Poag Primary Care Renu Asby: [name] Other Clinician: Referring Sydny Schnitzler: [name] Treating Ilario Dhaliwal/Extender: Skipper Cliche in Treatment: 0 Wound Status Wound Number: 5  [name], [name] ([id number]) Visit Report for 12/07/2021 Allergy List Details Patient Name: [name], [name] Date of Service: 12/07/2021 10:00 AM Medical Record Number: [id number] Patient Account Number: [id number] Date of Birth/Sex: 10/15/1945 (76 y.o. M) Treating RN: Levora Dredge Primary Care [place]ot Crom[place]ie: [name] Other Clinician: Referring Ona Roehrs: [name] Treating Tarris Delbene/Extender: Jeri Cos Weeks in Treatment: 0 Allergies Active Allergies No Known Allergies Allergy Notes Electronic Signature(s) Signed: 12/07/2021 4:37:42 PM By: Levora Dredge Entered By: Levora Dredge on 12/07/2021 10:12:55 [name], [name] -------------------------------------------------------------------------------- Arrival Information Details Patient Name: [name], [name] Date of Service: 12/07/2021 10:00 AM Medical Record Number: [id number] Patient Account Number: [id number] Date of Birth/Sex: [birth year] (76 y.o. M) Treating RN: Levora Dredge Primary Care Ortencia Askari: [name] Other Clinician: Referring Honorio Devol: [name] Treating Ariah Mower/Extender: Skipper Cliche in Treatment: 0 Visit Information Patient Arrived: Ambulatory Arrival Time: 10:09 Accompanied By: self Transfer Assistance: None Patient Identification Verified: Yes Secondary Verification Process Completed: Yes Patient Requires Transmission-Based No Precautions: Patient Has Alerts: Yes Patient Alerts: Patient on Blood Thinner NOT diabetic Xarelto AVVS ABI 12/13/21 [place] TBI L 0.72; R 0.75 History Since Last Visit Added or deleted any medications: No Any new allergies or adverse reactions: No Had a fall or experienced change in activities of daily living that may affect risk of falls: No Hospitalized since last visit: No Has Dressing in Place as Prescribed: No Electronic Signature(s) Signed: 12/13/2021 3:11:20 PM By: Donnamarie Poag Previous Signature: 12/07/2021 10:53:04 AM Version By: Donnamarie Poag Entered By: Donnamarie Poag on 12/13/2021 15:11:19 [name] ([id number]) -------------------------------------------------------------------------------- Clinic Level of Care Assessment Details Patient Name: [name] Nevin R. Date of Service: 12/07/2021 10:00 AM Medical Record Number: [id number] Patient Account Number: [id number] Date of Birth/Sex: 07-17-1946 (76 y.o. M) Treating RN: Donnamarie Poag Primary Care Ana Woodroof: [name] Other Clinician: Referring Antiono Ettinger: [name] Treating Manvi Guilliams/Extender: Skipper Cliche in Treatment: 0 Clinic Level of Care Assessment Items TOOL 2 Quantity Score []  - Use when only an EandM is performed on the INITIAL visit 0 ASSESSMENTS - Nursing Assessment / Reassessment []  - General Physical Exam (combine w/ comprehensive assessment (listed just below) when performed on new 0 pt. evals) []  - 0 Comprehensive Assessment (HX, ROS, Risk Assessments, Wounds Hx, etc.) ASSESSMENTS - Wound and Skin Assessment / Reassessment []  - Simple Wound Assessment / Reassessment - one wound 0 X- 3 5 Complex Wound Assessment / Reassessment - multiple wounds []  - 0 Dermatologic / Skin Assessment (not related to wound area) ASSESSMENTS - Ostomy and/or Continence Assessment and Care []  - Incontinence Assessment and Management 0 []  - 0 Ostomy Care Assessment and Management (repouching, etc.) PROCESS - Coordination of Care X - Simple Patient / Family Education for ongoing care 1 15 []  - 0 Complex (extensive) Patient / Family Education for ongoing care []  - 0 Staff obtains Programmer, systems, Records, Test Results / Process Orders []  - 0 Staff telephones HHA, Nursing Homes / Clarify orders / etc []  - 0 Routine Transfer to another Facility (non-emergent condition) []  - 0 Routine Hospital Admission (non-emergent condition) X- 1 15 New Admissions / Biomedical engineer / Ordering NPWT, Apligraf, etc. []  - 0 Emergency Hospital Admission (emergent  condition) X- 1 10 Simple Discharge Coordination []  - 0 Complex (extensive) Discharge Coordination PROCESS - Special Needs []  - Pediatric / Minor Patient Management 0 []  - 0 Isolation Patient Management []  - 0 Hearing / Language / Visual special needs []  - 0 Assessment of Community assistance (transportation, [name], etc.) []  -

## 2021-12-07 NOTE — Progress Notes (Signed)
[name], [name] ([id number]) ?Visit Report for 12/07/2021 ?Abuse Risk Screen Details ?Patient Name: [name], [name] ?Date of Service: 12/07/2021 10:00 AM ?Medical Record Number: [id number] ?Patient Account Number: [id number] ?Date of Birth/Sex: 1946/08/26 (76 y.o. M) ?Treating RN: Angelina Pih ?Primary Care Elai Vanwyk: [name] Other Clinician: ?Referring Kaspar Albornoz: [name] ?Treating Danyeal Akens/Extender: Allen Derry ?Weeks in Treatment: 0 ?Abuse Risk Screen Items ?Answer ?ABUSE RISK SCREEN: ?Has anyone close to you tried to hurt or harm you recentlyo No ?Do you feel uncomfortable with anyone in your familyo No ?Has anyone forced you do things that you didnot want to doo No ?Electronic Signature(s) ?Signed: 12/07/2021 4:37:42 PM By: Angelina Pih ?Entered By: Angelina Pih on 12/07/2021 10:16:15 ?CARLENS, MERKER R. ([id number]) ?-------------------------------------------------------------------------------- ?Activities of Daily Living Details ?Patient Name: [name], [name] ?Date of Service: 12/07/2021 10:00 AM ?Medical Record Number: [id number] ?Patient Account Number: [id number] ?Date of Birth/Sex: 03/20/1946 (76 y.o. M) ?Treating RN: Angelina Pih ?Primary Care Taleisha Kaczynski: [name] Other Clinician: ?Referring Heavan Francom: [name] ?Treating Liylah Najarro/Extender: Allen Derry ?Weeks in Treatment: 0 ?Activities of Daily Living Items ?Answer ?Activities of Daily Living (Please select one for each item) ?Drive Automobile Completely Able ?Take Medications Completely Able ?Use Telephone Completely Able ?Care for Appearance Completely Able ?Use Toilet Completely Able ?Bath / Shower Completely Able ?Dress Self Completely Able ?Feed Self Completely Able ?Walk Completely Able ?Get In / Out Bed Completely Able ?Housework Completely Able ?Prepare Meals Completely Able ?Handle Money Completely Able ?Shop for Self Completely Able ?Electronic Signature(s) ?Signed: 12/07/2021 4:37:42 PM By: Angelina Pih ?Entered By:  Angelina Pih on 12/07/2021 10:16:38 ?EBBIE, BOURBON R. ([id number]) ?-------------------------------------------------------------------------------- ?Education Screening Details ?Patient Name: [name], [name] ?Date of Service: 12/07/2021 10:00 AM ?Medical Record Number: [id number] ?Patient Account Number: [id number] ?Date of Birth/Sex: Dec 10, 1945 (76 y.o. M) ?Treating RN: Angelina Pih ?Primary Care Akesha Uresti: [name] Other Clinician: ?Referring Mariabelen Pressly: [name] ?Treating Zackariah Vanderpol/Extender: Allen Derry ?Weeks in Treatment: 0 ?Learning Preferences/Education Level/Primary Language ?Learning Preference: Explanation, Demonstration, Video, Communication Board, Printed Material ?Highest Education Level: College or Above ?Preferred Language: English ?Cognitive Barrier ?Language Barrier: No ?Translator Needed: No ?Memory Deficit: No ?Emotional Barrier: No ?Cultural/Religious Beliefs Affecting Medical Care: No ?Physical Barrier ?Impaired Vision: No ?Impaired Hearing: No ?Decreased Hand dexterity: No ?Knowledge/Comprehension ?Knowledge Level: High ?Comprehension Level: High ?Ability to understand written instructions: High ?Ability to understand verbal instructions: High ?Motivation ?Anxiety Level: Calm ?Cooperation: Cooperative ?Education Importance: Acknowledges Need ?Interest in Health Problems: Asks Questions ?Perception: Coherent ?Willingness to Engage in Self-Management ?High ?Activities: ?Readiness to Engage in Self-Management ?High ?Activities: ?Electronic Signature(s) ?Signed: 12/07/2021 4:37:42 PM By: Angelina Pih ?Entered By: Angelina Pih on 12/07/2021 10:17:01 ?WILFERD, KAWALEC R. ([id number]) ?-------------------------------------------------------------------------------- ?Fall Risk Assessment Details ?Patient Name: [name], [name] ?Date of Service: 12/07/2021 10:00 AM ?Medical Record Number: [id number] ?Patient Account Number: [id number] ?Date of Birth/Sex: [birth year] (76 y.o. M) ?Treating RN:  Angelina Pih ?Primary Care Cris Gibby: [name] Other Clinician: ?Referring Marja Adderley: [name] ?Treating Miracle Mongillo/Extender: Allen Derry ?Weeks in Treatment: 0 ?Fall Risk Assessment Items ?Have you had 2 or more falls in the last 12 monthso 0 No ?Have you had any fall that resulted in injury in the last 12 monthso 0 No ?FALLS RISK SCREEN ?History of falling - immediate or within 3 months 0 No ?Secondary diagnosis (Do you have 2 or more medical diagnoseso) 0 No ?Ambulatory aid ?None/bed rest/wheelchair/nurse 0 Yes ?Crutches/cane/walker 0 No ?Furniture 0 No ?Intravenous therapy Access/Saline/Heparin Lock 0 No ?Gait/Transferring ?Normal/ bed rest/ wheelchair 0 Yes ?Weak (short steps with or  without shuffle, stooped but able to lift head while walking, may ?0 No ?seek support from furniture) ?Impaired (short steps with shuffle, may have difficulty arising from chair, head down, impaired ?0 No ?balance) ?Mental Status ?Oriented to own ability 0 Yes ?Electronic Signature(s) ?Signed: 12/07/2021 4:37:42 PM By: Angelina Pih ?Entered By: Angelina Pih on 12/07/2021 10:17:18 ?DELON, LEN R. ([id number]) ?-------------------------------------------------------------------------------- ?Nutrition Risk Screening Details ?Patient Name: [name], [name] ?Date of Service: 12/07/2021 10:00 AM ?Medical Record Number: [id number] ?Patient Account Number: [id number] ?Date of Birth/Sex: [birth year] (76 y.o. M) ?Treating RN: Angelina Pih ?Primary Care Garyn Waguespack: [name] Other Clinician: ?Referring Chauncey Bruno: [name] ?Treating Chirsty Armistead/Extender: Allen Derry ?Weeks in Treatment: 0 ?Height (in): 75 ?Weight (lbs): 245 ?Body Mass Index (BMI): 30.6 ?Nutrition Risk Screening Items ?Score Screening ?NUTRITION RISK SCREEN: ?I have an illness or condition that made me change the kind and/or amount of food I eat 0 No ?I eat fewer than two meals per day 0 No ?I eat few fruits and vegetables, or milk products 0 No ?I have  three or more drinks of beer, liquor or wine almost every day 0 No ?I have tooth or mouth problems that make it hard for me to eat 0 No ?I don't always have enough money to buy the food I need 0 No ?I eat alone most of the time 0 No ?I take three or more different prescribed or over-the-counter drugs a day 0 No ?Without wanting to, I have lost or gained 10 pounds in the last six months 0 No ?I am not always physically able to shop, cook and/or feed myself 0 No ?Nutrition Protocols ?Good Risk Protocol 0 No interventions needed ?Moderate Risk Protocol ?High Risk Proctocol ?Risk Level: Good Risk ?Score: 0 ?Electronic Signature(s) ?Signed: 12/07/2021 4:37:42 PM By: Angelina Pih ?Entered By: Angelina Pih on 12/07/2021 10:17:34 ?

## 2021-12-07 NOTE — Progress Notes (Addendum)
GALILEO, COLELLO ([id number]) Visit Report for 12/07/2021 Chief Complaint Document Details Patient Name: [name], WILSEY. Date of Service: 12/07/2021 10:00 AM Medical Record Number: [id number] Patient Account Number: [id number] Date of Birth/Sex: Oct 08, 1946 (76 y.o. M) Treating RN: Donnamarie Poag Primary Care Provider: [name] Other Clinician: Referring Provider: [name] Treating Provider/Extender: Skipper Cliche in Treatment: 0 Information Obtained from: Patient Chief Complaint Bilateral LE Ulcer Electronic Signature(s) Signed: 12/07/2021 11:16:50 AM By: [name] PA-C Entered By: [name] on 12/07/2021 11:16:49 [name] ([id number]) -------------------------------------------------------------------------------- HPI Details Patient Name: KAMON, FAHR. Date of Service: 12/07/2021 10:00 AM Medical Record Number: [id number] Patient Account Number: [id number] Date of Birth/Sex: Dec 16, 1945 (76 y.o. M) Treating RN: Donnamarie Poag Primary Care Provider: [name] Other Clinician: Referring Provider: [name] Treating Provider/Extender: Skipper Cliche in Treatment: 0 History of Present Illness HPI Description: 76 year old male who has a past medical history of essential hypertension, chronic atrial fibrillation, peripheral vascular disease, nonischemic cardiomyopathy,venous stasis dermatitis, gouty arthropathy, basal cell carcinoma of the right lower extremity, benign prostatic hypertrophy, long-term use of anticoagulation therapy, hyperglycemia and exercise intolerance has never been a smoker. the patient has had a vascular workup over 7 years ago and said everything was normal at that stage. He does not have any chronic problems except for cardiac issues which he sees a cardiologist in [place]. 08/15/2017 -- arterial and venous duplex studies still pending. 08/23/2017 -- venous reflux studies done on 08/13/2017 shows venous incompetence  throughout the left lower extremity deep system and focally at the left saphenofemoral junction. 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 No evidence of SVT or DVT in bilateral lower extremities The patient has an appointment at the end of the month to get his arterial duplex study done 09/05/2017 -- the patient was seen at the vein and vascular office yesterday by Angelena Form. ABI studies were notable for medial calcification and the toe brachial indices were normal and bilateral ankle-brachial) waveforms were normal with triphasic flow. After review of his venous studies he was not a candidate for laser ablation and his lymphedema was to be treated with compression stockings and lymphedema pump pumps 09/12/2017 -- had a low arterial study done at the [facility] -- unable to obtain reliable ABI is due to medial calcification. Bilateral toe indices were normal with the right being 1.01 and the left being 0.92 and the waveforms were triphasic bilaterally. he did get hold of 30-40 mm compression stockings but is unable to put these on. We will try and get him alternative compression stockings. 09/26/17- he is here in follow up evaluation of a right lower extremity ulcer;he is compliant in wearing compression stocking; ulcer almost epithelialized , anticipate healing next appointment Readmission: 11/17 point upon evaluation patient's wound currently that he is seeing [place] for today is a skin cancerous lesion that was cleared away by his dermatologist on the left medial calf region. He tells me that this is a very similar thing to what he had done previously in fact the last time he saw him in 2018 this was also what was going on at that point. Nonetheless he feels that based on what he seeing currently that this is just having a lot of harder time healing although it is much closer to the surface than what he is experienced in the past. He notes that the  initial removal was in June 2022 which was this year this is now  November and still has not closed. He does have some edema and definitely I think that there is some venous component to his slow healing here. Also think that we can do something better than Vaseline to try to help with getting this to clear up as quickly as possible. He does have a history of atrial fibrillation and is on Eliquis otherwise he really has no major medical problems that would affect wound healing. 09/07/2021 upon evaluation today patient actually appears to be doing significantly better after having wrapped him last week. Overall I think that this is making significant improvements at this time which is great news. I do not see any evidence of infection which is great news as well. No fevers, chills, nausea, vomiting, or diarrhea. 09/14/2021 upon evaluation today patient appears to be doing well with regard to his leg ulcer. He has been tolerating the dressing changes and overall I think that he is making excellent progress. I do not see any signs of active infection at this time. 09/21/2021 upon evaluation today patient actually appears to be making good progress with regard to his wound this is again measuring smaller today no debridement seems to be necessary. We have been using a silver collagen dressing and I think that is doing an awesome job. 09/28/2021 upon evaluation today patient appears to be doing well with regard to his leg currently. I do not see any signs of active infection at this time which is great news. No fevers, chills, nausea, vomiting, or diarrhea. I think this wound is very close to complete resolution. 10/12/2021 upon evaluation today patient actually appears to be doing awesome in regard to his leg ulcer. In fact this appears to be completely healed based on what I am seeing currently. I do not see any evidence of active infection locally nor systemically at this time which is also great news. No  fevers, chills, nausea, vomiting, or diarrhea. Readmission: 12/07/2021 upon evaluation today patient presents for readmission here in the clinic. He was discharged on 10/12/2021 is completely healed. Unfortunately this has reopened at this point and he is having continual issues with new blisters over both lower extremities. This is even worse than what we previously saw. Nonetheless we did actually check his ABIs today and it did reveal that his ABIs were 0.55 on the left and 0.57 on the right. Subsequently this is a definite change from his last arterial study which showed that he did have good blood flow at 1.01 on the right and 0.92 on the left and that was right at the beginning of 2019. Nonetheless based on what we see currently I do think he tolerated the 3 layer compression wrap but I do believe that we probably need to get him tested for his arterial flow in order to see where things stand and if there is something we can do there that would help prevent this from continue to be an ongoing issue. He did not utilize compression socks in the interim from when he was last here till this time. That something is probably going to need lifelong going forward as well. JAQUES, MINEER ([id number]) Electronic Signature(s) Signed: 12/07/2021 2:43:35 PM By: [name] PA-C Entered By: [name] on 12/07/2021 14:43:35 REYNOLDO, MAINER ([id number]) -------------------------------------------------------------------------------- Physical Exam Details Patient Name: VANG, KRAEGER. Date of Service: 12/07/2021 10:00 AM Medical Record Number: [id number] Patient Account Number: [id number] Date of Birth/Sex: 29-Jul-1946 (76 y.o. M) Treating RN: Donnamarie Poag Primary Care Provider: Caryn Section,  Elenore Rota Other Clinician: Referring Provider: [name] Treating Provider/Extender: Skipper Cliche in Treatment: 0 Constitutional sitting or standing blood pressure is within target range for patient.. pulse  regular and within target range for patient.Marland Kitchen respirations regular, non- labored and within target range for patient.Marland Kitchen temperature within target range for patient.. Well-nourished and well-hydrated in no acute distress. Eyes conjunctiva clear no eyelid edema noted. pupils equal round and reactive to light and accommodation. Ears, Nose, Mouth, and Throat no gross abnormality of ear auricles or external auditory canals. normal hearing noted during conversation. mucus membranes moist. Respiratory normal breathing without difficulty. Cardiovascular 1+ dorsalis pedis/posterior tibialis pulses. 1+ pitting edema of the bilateral lower extremities. Musculoskeletal normal gait and posture. no significant deformity or arthritic changes, no loss or range of motion, no clubbing. Psychiatric this patient is able to make decisions and demonstrates good insight into disease process. Alert and Oriented x 3. pleasant and cooperative. Notes Upon inspection patient's wound bed actually showed signs of not being too deep he was fairly superficial but still fat layer exposed at both locations. With that being said I do believe that based on what we are seeing he is going to require some intervention here to try to get this under control I do believe a 3 layer compression wrap would be appropriate he tolerated that previously and I think that should be just fine. He is in agreement with that plan. His capillary refill is good with less than 3-second capillary refill he has edema however is 1+ pitting bilaterally. Electronic Signature(s) Signed: 12/07/2021 2:44:09 PM By: [name] PA-C Entered By: [name] on 12/07/2021 14:44:08 JALEN, DALUZ ([id number]) -------------------------------------------------------------------------------- Physician Orders Details Patient Name: LINCOLN, GINLEY. Date of Service: 12/07/2021 10:00 AM Medical Record Number: [id number] Patient Account Number: [id number] Date  of Birth/Sex: 1946-09-20 (76 y.o. M) Treating RN: Donnamarie Poag Primary Care Provider: [name] Other Clinician: Referring Provider: [name] Treating Provider/Extender: Skipper Cliche in Treatment: 0 Verbal / Phone Orders: No Diagnosis Coding ICD-10 Coding Code Description I87.2 Venous insufficiency (chronic) (peripheral) I73.89 Other specified peripheral vascular diseases L97.822 Non-pressure chronic ulcer of other part of left lower leg with fat layer exposed L97.812 Non-pressure chronic ulcer of other part of right lower leg with fat layer exposed I48.0 Paroxysmal atrial fibrillation I10 Essential (primary) hypertension Follow-up Appointments o Return Appointment in 1 week. o Nurse Visit as needed Bathing/ Shower/ Hygiene o May shower with wound dressing protected with water repellent cover or cast protector. o No tub bath. Anesthetic (Use 'Patient Medications' Section for Anesthetic Order Entry) o Lidocaine applied to wound bed Edema Control - Lymphedema / Segmental Compressive Device / Other o Optional: One layer of unna paste to top of compression wrap (to act as an anchor). o Elevate, Exercise Daily and Avoid Standing for Long Periods of Time. o Elevate legs to the level of the heart and pump ankles as often as possible o Elevate leg(s) parallel to the floor when sitting. Additional Orders / Instructions o Follow Nutritious Diet and Increase Protein Intake Wound Treatment Wound #4 - Lower Leg Wound Laterality: Right, Medial Cleanser: Soap and Water 1 x Per Week/15 Days Discharge Instructions: Gently cleanse wound with antibacterial soap, rinse and pat dry prior to dressing wounds Cleanser: Wound Cleanser 1 x Per Week/15 Days Discharge Instructions: Wash your hands with soap and water. Remove old dressing, discard into plastic bag and place into trash. Cleanse the wound with Wound Cleanser prior to applying a clean  dressing using gauze  sponges, not tissues or cotton balls. Do not scrub or use excessive force. Pat dry using gauze sponges, not tissue or cotton balls. Primary Dressing: Silvercel Small 2x2 (in/in) 1 x Per Week/15 Days Discharge Instructions: Apply Silvercel Small 2x2 (in/in) as instructed Secondary Dressing: (NON-BORDER) Zetuvit Plus Silicone NON-BORDER 5x5 (in/in) 1 x Per Week/15 Days Compression Wrap: 3-LAYER WRAP - Profore Lite LF 3 Multilayer Compression Bandaging System 1 x Per Week/15 Days Discharge Instructions: Apply 3 multi-layer wrap as prescribed. Wound #5 - Lower Leg Wound Laterality: Left, Medial, Distal Cleanser: Soap and Water 1 x Per Week/15 Days Discharge Instructions: Gently cleanse wound with antibacterial soap, rinse and pat dry prior to dressing wounds Cleanser: Wound Cleanser 1 x Per [address] KREGG, CIHLAR ([id number]) Discharge Instructions: Wash your hands with soap and water. Remove old dressing, discard into plastic bag and place into trash. Cleanse the wound with Wound Cleanser prior to applying a clean dressing using gauze sponges, not tissues or cotton balls. Do not scrub or use excessive force. Pat dry using gauze sponges, not tissue or cotton balls. Primary Dressing: Silvercel Small 2x2 (in/in) 1 x Per Week/15 Days Discharge Instructions: Apply Silvercel Small 2x2 (in/in) as instructed Secondary Dressing: (NON-BORDER) Zetuvit Plus Silicone NON-BORDER 5x5 (in/in) 1 x Per Week/15 Days Compression Wrap: 3-LAYER WRAP - Profore Lite LF 3 Multilayer Compression Bandaging System 1 x Per Week/15 Days Discharge Instructions: Apply 3 multi-layer wrap as prescribed. Wound #6 - Lower Leg Wound Laterality: Left, Medial, Proximal Cleanser: Soap and Water 1 x Per Week/15 Days Discharge Instructions: Gently cleanse wound with antibacterial soap, rinse and pat dry prior to dressing wounds Cleanser: Wound Cleanser 1 x Per Week/15 Days Discharge Instructions: Wash your hands with soap and  water. Remove old dressing, discard into plastic bag and place into trash. Cleanse the wound with Wound Cleanser prior to applying a clean dressing using gauze sponges, not tissues or cotton balls. Do not scrub or use excessive force. Pat dry using gauze sponges, not tissue or cotton balls. Primary Dressing: Silvercel Small 2x2 (in/in) 1 x Per Week/15 Days Discharge Instructions: Apply Silvercel Small 2x2 (in/in) as instructed Secondary Dressing: (NON-BORDER) Zetuvit Plus Silicone NON-BORDER 5x5 (in/in) 1 x Per Week/15 Days Compression Wrap: 3-LAYER WRAP - Profore Lite LF 3 Multilayer Compression Bandaging System 1 x Per Week/15 Days Discharge Instructions: Apply 3 multi-layer wrap as prescribed. Consults o Vascular - ABI/TBI Electronic Signature(s) Signed: 12/12/2021 7:37:05 PM By: [name] PA-C Signed: 12/13/2021 12:55:43 PM By: Donnamarie Poag Previous Signature: 12/07/2021 3:50:59 PM Version By: Donnamarie Poag Previous Signature: 12/07/2021 5:22:07 PM Version By: [name] PA-C Entered By: Donnamarie Poag on 12/12/2021 15:40:47 [place], Marlou Starks ([id number]) -------------------------------------------------------------------------------- Problem List Details Patient Name: ANIL, HAVARD R. Date of Service: 12/07/2021 10:00 AM Medical Record Number: [id number] Patient Account Number: [id number] Date of Birth/Sex: 1945-11-26 (76 y.o. M) Treating RN: Donnamarie Poag Primary Care Provider: [name] Other Clinician: Referring Provider: [name] Treating Provider/Extender: Skipper Cliche in Treatment: 0 Active Problems ICD-10 Encounter Code Description Active Date MDM Diagnosis I87.2 Venous insufficiency (chronic) (peripheral) 12/07/2021 No Yes I73.89 Other specified peripheral vascular diseases 12/07/2021 No Yes L97.822 Non-pressure chronic ulcer of other part of left lower leg with fat layer 12/07/2021 No Yes exposed L97.812 Non-pressure chronic ulcer of other part of right lower leg  with fat layer 12/07/2021 No Yes exposed I48.0 Paroxysmal atrial fibrillation 12/07/2021 No Yes I10 Essential (primary) hypertension 12/07/2021 No Yes Inactive Problems Resolved  Problems Electronic Signature(s) Signed: 12/07/2021 11:06:21 AM By: [name] PA-C Entered By: [name] on 12/07/2021 11:06:21 SKYLIER, KRETSCHMER ([id number]) -------------------------------------------------------------------------------- Progress Note Details Patient Name: MARKANTHONY, GEDNEY R. Date of Service: 12/07/2021 10:00 AM Medical Record Number: [id number] Patient Account Number: [id number] Date of Birth/Sex: 1946-09-05 (76 y.o. M) Treating RN: Donnamarie Poag Primary Care Provider: [name] Other Clinician: Referring Provider: [name] Treating Provider/Extender: Skipper Cliche in Treatment: 0 Subjective Chief Complaint Information obtained from Patient Bilateral LE Ulcer History of Present Illness (HPI) 76 year old male who has a past medical history of essential hypertension, chronic atrial fibrillation, peripheral vascular disease, nonischemic cardiomyopathy,venous stasis dermatitis, gouty arthropathy, basal cell carcinoma of the right lower extremity, benign prostatic hypertrophy, long- term use of anticoagulation therapy, hyperglycemia and exercise intolerance has never been a smoker. the patient has had a vascular workup over 7 years ago and said everything was normal at that stage. He does not have any chronic problems except for cardiac issues which he sees a cardiologist in [place]. 08/15/2017 -- arterial and venous duplex studies still pending. 08/23/2017 -- venous reflux studies done on 08/13/2017 shows venous incompetence throughout the left lower extremity deep system and focally at the left saphenofemoral junction. 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 No evidence of SVT or DVT in bilateral lower extremities The patient has an appointment at the end  of the month to get his arterial duplex study done 09/05/2017 -- the patient was seen at the vein and vascular office yesterday by Angelena Form. ABI studies were notable for medial calcification and the toe brachial indices were normal and bilateral ankle-brachial) waveforms were normal with triphasic flow. After review of his venous studies he was not a candidate for laser ablation and his lymphedema was to be treated with compression stockings and lymphedema pump pumps 09/12/2017 -- had a low arterial study done at the [facility] -- unable to obtain reliable ABI is due to medial calcification. Bilateral toe indices were normal with the right being 1.01 and the left being 0.92 and the waveforms were triphasic bilaterally. he did get hold of 30-40 mm compression stockings but is unable to put these on. We will try and get him alternative compression stockings. 09/26/17- he is here in follow up evaluation of a right lower extremity ulcer;he is compliant in wearing compression stocking; ulcer almost epithelialized , anticipate healing next appointment Readmission: 11/17 point upon evaluation patient's wound currently that he is seeing [place] for today is a skin cancerous lesion that was cleared away by his dermatologist on the left medial calf region. He tells me that this is a very similar thing to what he had done previously in fact the last time he saw him in 2018 this was also what was going on at that point. Nonetheless he feels that based on what he seeing currently that this is just having a lot of harder time healing although it is much closer to the surface than what he is experienced in the past. He notes that the initial removal was in June 2022 which was this year this is now November and still has not closed. He does have some edema and definitely I think that there is some venous component to his slow healing here. Also think that we can do something better than  Vaseline to try to help with getting this to clear up as quickly as possible. He does have a history of atrial fibrillation  and is on Eliquis otherwise he really has no major medical problems that would affect wound healing. 09/07/2021 upon evaluation today patient actually appears to be doing significantly better after having wrapped him last week. Overall I think that this is making significant improvements at this time which is great news. I do not see any evidence of infection which is great news as well. No fevers, chills, nausea, vomiting, or diarrhea. 09/14/2021 upon evaluation today patient appears to be doing well with regard to his leg ulcer. He has been tolerating the dressing changes and overall I think that he is making excellent progress. I do not see any signs of active infection at this time. 09/21/2021 upon evaluation today patient actually appears to be making good progress with regard to his wound this is again measuring smaller today no debridement seems to be necessary. We have been using a silver collagen dressing and I think that is doing an awesome job. 09/28/2021 upon evaluation today patient appears to be doing well with regard to his leg currently. I do not see any signs of active infection at this time which is great news. No fevers, chills, nausea, vomiting, or diarrhea. I think this wound is very close to complete resolution. 10/12/2021 upon evaluation today patient actually appears to be doing awesome in regard to his leg ulcer. In fact this appears to be completely healed based on what I am seeing currently. I do not see any evidence of active infection locally nor systemically at this time which is also great news. No fevers, chills, nausea, vomiting, or diarrhea. Readmission: 12/07/2021 upon evaluation today patient presents for readmission here in the clinic. He was discharged on 10/12/2021 is completely healed. Unfortunately this has reopened at this point and he is  having continual issues with new blisters over both lower extremities. This is even worse than what we previously saw. Nonetheless we did actually check his ABIs today and it did reveal that his ABIs were 0.55 on the left and 0.57 on the right. Subsequently this is a definite change from his last arterial study which showed that he did have good blood flow at 1.01 on the right and 0.92 on the left and that was right at the beginning of 2019. Nonetheless based on what we see currently I do think he tolerated the 3 layer Bovee, Lowery R. ([id number]) compression wrap but I do believe that we probably need to get him tested for his arterial flow in order to see where things stand and if there is something we can do there that would help prevent this from continue to be an ongoing issue. He did not utilize compression socks in the interim from when he was last here till this time. That something is probably going to need lifelong going forward as well. Patient History Information obtained from Patient. Allergies No Known Allergies Family History Cancer - Father, Diabetes - Paternal Grandparents, Heart Disease - Father, Hypertension - Mother,Father,Siblings, No family history of Hereditary Spherocytosis, Kidney Disease, Lung Disease, Seizures, Stroke, Thyroid Problems, Tuberculosis. Social History Never smoker, Marital Status - Divorced, Alcohol Use - Rarely, Drug Use - No History, Caffeine Use - Never. Medical History Cardiovascular Patient has history of Arrhythmia - a-fib, Hypertension Endocrine Patient has history of Type II Diabetes Musculoskeletal Patient has history of Gout Medical And Surgical History Notes Endocrine pt states never told he was a diabetic Review of Systems (ROS) Constitutional Symptoms (General Health) Denies complaints or symptoms of Fatigue, Fever, Chills, Marked Weight  Change. Eyes Denies complaints or symptoms of Dry Eyes, Vision Changes, Glasses /  Contacts. Ear/Nose/Mouth/Throat Denies complaints or symptoms of Difficult clearing ears, Sinusitis. Hematologic/Lymphatic Denies complaints or symptoms of Bleeding / Clotting Disorders, Human Immunodeficiency Virus. Respiratory Denies complaints or symptoms of Chronic or frequent coughs, Shortness of Breath. Cardiovascular Complains or has symptoms of LE edema. Gastrointestinal Denies complaints or symptoms of Frequent diarrhea, Nausea, Vomiting. Genitourinary Denies complaints or symptoms of Kidney failure/ Dialysis, Incontinence/dribbling. Immunological Denies complaints or symptoms of Hives, Itching. Integumentary (Skin) Complains or has symptoms of Wounds - wound re opened. Neurologic Denies complaints or symptoms of Numbness/parasthesias, Focal/Weakness. Psychiatric Denies complaints or symptoms of Anxiety, Claustrophobia. Objective Constitutional sitting or standing blood pressure is within target range for patient.. pulse regular and within target range for patient.Marland Kitchen respirations regular, non- labored and within target range for patient.Marland Kitchen temperature within target range for patient.. Well-nourished and well-hydrated in no acute distress. Vitals Time Taken: 10:10 AM, Height: 75 in, Source: Stated, Weight: 245 lbs, Source: Stated, BMI: 30.6, Temperature: 98.2 F, Pulse: 51 bpm, Respiratory Rate: 18 breaths/min, Blood Pressure: 133/83 mmHg. Eyes conjunctiva clear no eyelid edema noted. pupils equal round and reactive to light and accommodation. NEWTON, FRUTIGER R. ([id number]) Ears, Nose, Mouth, and Throat no gross abnormality of ear auricles or external auditory canals. normal hearing noted during conversation. mucus membranes moist. Respiratory normal breathing without difficulty. Cardiovascular 1+ dorsalis pedis/posterior tibialis pulses. 1+ pitting edema of the bilateral lower extremities. Musculoskeletal normal gait and posture. no significant deformity or arthritic  changes, no loss or range of motion, no clubbing. Psychiatric this patient is able to make decisions and demonstrates good insight into disease process. Alert and Oriented x 3. pleasant and cooperative. General Notes: Upon inspection patient's wound bed actually showed signs of not being too deep he was fairly superficial but still fat layer exposed at both locations. With that being said I do believe that based on what we are seeing he is going to require some intervention here to try to get this under control I do believe a 3 layer compression wrap would be appropriate he tolerated that previously and I think that should be just fine. He is in agreement with that plan. His capillary refill is good with less than 3-second capillary refill he has edema however is 1+ pitting bilaterally. Integumentary (Hair, Skin) Wound #4 status is Open. Original cause of wound was Gradually Appeared. The date acquired was: 11/09/2021. The wound is located on the Right,Medial Lower Leg. The wound measures 0.7cm length x 0.8cm width x 0.1cm depth; 0.44cm^2 area and 0.044cm^3 volume. There is Fat Layer (Subcutaneous Tissue) exposed. There is no tunneling or undermining noted. There is a medium amount of serosanguineous drainage noted. There is small (1-33%) red granulation within the wound bed. There is a large (67-100%) amount of necrotic tissue within the wound bed including Eschar and Adherent Slough. Wound #5 status is Open. Original cause of wound was Gradually Appeared. The date acquired was: 11/09/2021. The wound is located on the Left,Distal,Medial Lower Leg. The wound measures 0.6cm length x 0.3cm width x 0.1cm depth; 0.141cm^2 area and 0.014cm^3 volume. There is Fat Layer (Subcutaneous Tissue) exposed. There is a medium amount of serosanguineous drainage noted. There is small (1-33%) red granulation within the wound bed. There is a large (67-100%) amount of necrotic tissue within the wound bed including Eschar  and Adherent Slough. Wound #6 status is Open. Original cause of wound was Gradually Appeared. The date acquired was: 11/09/2021.  The wound is located on the Left,Proximal,Medial Lower Leg. The wound measures 2cm length x 2.3cm width x 0.1cm depth; 3.613cm^2 area and 0.361cm^3 volume. There is Fat Layer (Subcutaneous Tissue) exposed. There is no tunneling or undermining noted. There is a medium amount of serosanguineous drainage noted. There is small (1-33%) red granulation within the wound bed. There is a large (67-100%) amount of necrotic tissue within the wound bed including Eschar and Adherent Slough. Assessment Active Problems ICD-10 Venous insufficiency (chronic) (peripheral) Other specified peripheral vascular diseases Non-pressure chronic ulcer of other part of left lower leg with fat layer exposed Non-pressure chronic ulcer of other part of right lower leg with fat layer exposed Paroxysmal atrial fibrillation Essential (primary) hypertension Procedures Wound #4 Pre-procedure diagnosis of Wound #4 is a Venous Leg Ulcer located on the Right,Medial Lower Leg . There was a Three Layer Compression Therapy Procedure by Donnamarie Poag, RN. Post procedure Diagnosis Wound #4: Same as Pre-Procedure Wound #5 Pre-procedure diagnosis of Wound #5 is a Venous Leg Ulcer located on the Left,Distal,Medial Lower Leg . There was a Three Layer Compression Therapy Procedure by Donnamarie Poag, RN. Post procedure Diagnosis Wound #5: Same as Pre-Procedure ZYLEN, WENIG ([id number]) Plan Follow-up Appointments: Return Appointment in 1 week. Nurse Visit as needed Bathing/ Shower/ Hygiene: May shower with wound dressing protected with water repellent cover or cast protector. No tub bath. Anesthetic (Use 'Patient Medications' Section for Anesthetic Order Entry): Lidocaine applied to wound bed Edema Control - Lymphedema / Segmental Compressive Device / Other: Optional: One layer of unna paste to top of  compression wrap (to act as an anchor). Elevate, Exercise Daily and Avoid Standing for Long Periods of Time. Elevate legs to the level of the heart and pump ankles as often as possible Elevate leg(s) parallel to the floor when sitting. Additional Orders / Instructions: Follow Nutritious Diet and Increase Protein Intake WOUND #4: - Lower Leg Wound Laterality: Right, Medial Cleanser: Soap and Water 1 x Per Week/15 Days Discharge Instructions: Gently cleanse wound with antibacterial soap, rinse and pat dry prior to dressing wounds Cleanser: Wound Cleanser 1 x Per Week/15 Days Discharge Instructions: Wash your hands with soap and water. Remove old dressing, discard into plastic bag and place into trash. Cleanse the wound with Wound Cleanser prior to applying a clean dressing using gauze sponges, not tissues or cotton balls. Do not scrub or use excessive force. Pat dry using gauze sponges, not tissue or cotton balls. Primary Dressing: Silvercel Small 2x2 (in/in) 1 x Per Week/15 Days Discharge Instructions: Apply Silvercel Small 2x2 (in/in) as instructed Secondary Dressing: Zetuvit Plus Silicone Non-bordered 5x5 (in/in) 1 x Per Week/15 Days Compression Wrap: Profore Lite LF 3 Multilayer Compression Bandaging System 1 x Per Week/15 Days Discharge Instructions: Apply 3 multi-layer wrap as prescribed. WOUND #5: - Lower Leg Wound Laterality: Left, Medial, Distal Cleanser: Soap and Water 1 x Per Week/15 Days Discharge Instructions: Gently cleanse wound with antibacterial soap, rinse and pat dry prior to dressing wounds Cleanser: Wound Cleanser 1 x Per Week/15 Days Discharge Instructions: Wash your hands with soap and water. Remove old dressing, discard into plastic bag and place into trash. Cleanse the wound with Wound Cleanser prior to applying a clean dressing using gauze sponges, not tissues or cotton balls. Do not scrub or use excessive force. Pat dry using gauze sponges, not tissue or cotton  balls. Primary Dressing: Silvercel Small 2x2 (in/in) 1 x Per Week/15 Days Discharge Instructions: Apply Silvercel Small 2x2 (in/in) as instructed Secondary  Dressing: Zetuvit Plus Silicone Non-bordered 5x5 (in/in) 1 x Per Week/15 Days Compression Wrap: Profore Lite LF 3 Multilayer Compression Bandaging System 1 x Per Week/15 Days Discharge Instructions: Apply 3 multi-layer wrap as prescribed. WOUND #6: - Lower Leg Wound Laterality: Left, Medial, Proximal Cleanser: Soap and Water 1 x Per Week/15 Days Discharge Instructions: Gently cleanse wound with antibacterial soap, rinse and pat dry prior to dressing wounds Cleanser: Wound Cleanser 1 x Per Week/15 Days Discharge Instructions: Wash your hands with soap and water. Remove old dressing, discard into plastic bag and place into trash. Cleanse the wound with Wound Cleanser prior to applying a clean dressing using gauze sponges, not tissues or cotton balls. Do not scrub or use excessive force. Pat dry using gauze sponges, not tissue or cotton balls. Primary Dressing: Silvercel Small 2x2 (in/in) 1 x Per Week/15 Days Discharge Instructions: Apply Silvercel Small 2x2 (in/in) as instructed Secondary Dressing: Zetuvit Plus Silicone Non-bordered 5x5 (in/in) 1 x Per Week/15 Days Compression Wrap: Profore Lite LF 3 Multilayer Compression Bandaging System 1 x Per Week/15 Days Discharge Instructions: Apply 3 multi-layer wrap as prescribed. 1. Would recommend currently that we going to continue with the wound care measures as before with regard to the dressings I think we will use the silver alginate dressing which is probably good to be the best way to go currently. 2. Also, using Zetuvit to cover. 3. We will use a 3 layer compression wrap to see where things stand. Obviously I do not want to do anything any stronger until we get the arterial studies done at least. 4. We are going to order an arterial study with ABI and TBI. We will see patient back for  reevaluation in 1 week here in the clinic. If anything worsens or changes patient will contact our office for additional recommendations. Electronic Signature(s) Signed: 12/07/2021 2:44:45 PM By: [name] PA-C Entered By: [name] on 12/07/2021 14:44:44 HELMUT, HENNON R. ([id number]) -------------------------------------------------------------------------------- ROS/PFSH Details Patient Name: KEIGHAN, AMEZCUA R. Date of Service: 12/07/2021 10:00 AM Medical Record Number: [id number] Patient Account Number: [id number] Date of Birth/Sex: 09/16/1946 (76 y.o. M) Treating RN: Levora Dredge Primary Care Provider: [name] Other Clinician: Referring Provider: [name] Treating Provider/Extender: Skipper Cliche in Treatment: 0 Information Obtained From Patient Constitutional Symptoms (General Health) Complaints and Symptoms: Negative for: Fatigue; Fever; Chills; Marked Weight Change Eyes Complaints and Symptoms: Negative for: Dry Eyes; Vision Changes; Glasses / Contacts Ear/Nose/Mouth/Throat Complaints and Symptoms: Negative for: Difficult clearing ears; Sinusitis Hematologic/Lymphatic Complaints and Symptoms: Negative for: Bleeding / Clotting Disorders; Human Immunodeficiency Virus Respiratory Complaints and Symptoms: Negative for: Chronic or frequent coughs; Shortness of Breath Cardiovascular Complaints and Symptoms: Positive for: LE edema Medical History: Positive for: Arrhythmia - a-fib; Hypertension Gastrointestinal Complaints and Symptoms: Negative for: Frequent diarrhea; Nausea; Vomiting Genitourinary Complaints and Symptoms: Negative for: Kidney failure/ Dialysis; Incontinence/dribbling Immunological Complaints and Symptoms: Negative for: Hives; Itching Integumentary (Skin) Complaints and Symptoms: Positive for: Wounds - wound re opened Neurologic CYRUSS, ARATA R. ([id number]) Complaints and Symptoms: Negative for: Numbness/parasthesias;  Focal/Weakness Psychiatric Complaints and Symptoms: Negative for: Anxiety; Claustrophobia Endocrine Medical History: Positive for: Type II Diabetes Past Medical History Notes: pt states never told he was a diabetic Treated with: Diet Musculoskeletal Medical History: Positive for: Gout Oncologic Immunizations Pneumococcal Vaccine: Received Pneumococcal Vaccination: Yes Received Pneumococcal Vaccination On or After 60th Birthday: Yes Implantable Devices None Family and Social History Cancer: Yes - Father; Diabetes: Yes - Paternal Grandparents; Heart Disease: Yes - Father;  Hereditary Spherocytosis: No; Hypertension: Yes - Mother,Father,Siblings; Kidney Disease: No; Lung Disease: No; Seizures: No; Stroke: No; Thyroid Problems: No; Tuberculosis: No; Never smoker; Marital Status - Divorced; Alcohol Use: Rarely; Drug Use: No History; Caffeine Use: Never; Financial Concerns: No; Food, Clothing or Shelter Needs: No; Support System Lacking: No; Transportation Concerns: No Electronic Signature(s) Signed: 12/07/2021 4:37:42 PM By: Levora Dredge Signed: 12/07/2021 5:22:07 PM By: [name] PA-C Entered By: Levora Dredge on 12/07/2021 10:16:06 AMOUR, CUTRONE ([id number]) -------------------------------------------------------------------------------- SuperBill Details Patient Name: BRAEDIN, MILLHOUSE R. Date of Service: 12/07/2021 Medical Record Number: [id number] Patient Account Number: [id number] Date of Birth/Sex: 24-Mar-1946 (76 y.o. M) Treating RN: Donnamarie Poag Primary Care Provider: [name] Other Clinician: Referring Provider: [name] Treating Provider/Extender: Skipper Cliche in Treatment: 0 Diagnosis Coding ICD-10 Codes Code Description I87.2 Venous insufficiency (chronic) (peripheral) I73.89 Other specified peripheral vascular diseases L97.822 Non-pressure chronic ulcer of other part of left lower leg with fat layer exposed L97.812 Non-pressure chronic ulcer of  other part of right lower leg with fat layer exposed I48.0 Paroxysmal atrial fibrillation I10 Essential (primary) hypertension Facility Procedures CPT4: Description Modifier Quantity Code [id number] - WOUND CARE VISIT-LEV 4 EST PT 1 CPT4: [id number] BILATERAL: Application of multi-layer venous compression system; leg (below knee), including 1 ankle and foot. Physician Procedures CPT4 Code: [id number] Description: 10626 - WC PHYS LEVEL 4 - EST PT Modifier: Quantity: 1 CPT4 Code: Description: ICD-10 Diagnosis Description I87.2 Venous insufficiency (chronic) (peripheral) I73.89 Other specified peripheral vascular diseases L97.822 Non-pressure chronic ulcer of other part of left lower leg with fat lay L97.812 Non-pressure chronic  ulcer of other part of right lower leg with fat la Modifier: er exposed yer exposed Quantity: Electronic Signature(s) Signed: 12/07/2021 2:44:56 PM By: [name] PA-C Entered By: [name] on 12/07/2021 14:44:56

## 2021-12-12 DIAGNOSIS — D045 Carcinoma in situ of skin of trunk: Secondary | ICD-10-CM | POA: Diagnosis not present

## 2021-12-12 DIAGNOSIS — C44612 Basal cell carcinoma of skin of right upper limb, including shoulder: Secondary | ICD-10-CM | POA: Diagnosis not present

## 2021-12-13 ENCOUNTER — Ambulatory Visit (INDEPENDENT_AMBULATORY_CARE_PROVIDER_SITE_OTHER): Payer: Medicare PPO

## 2021-12-13 ENCOUNTER — Other Ambulatory Visit: Payer: Self-pay

## 2021-12-13 DIAGNOSIS — L97812 Non-pressure chronic ulcer of other part of right lower leg with fat layer exposed: Secondary | ICD-10-CM

## 2021-12-13 DIAGNOSIS — L97822 Non-pressure chronic ulcer of other part of left lower leg with fat layer exposed: Secondary | ICD-10-CM | POA: Diagnosis not present

## 2021-12-14 ENCOUNTER — Encounter (HOSPITAL_BASED_OUTPATIENT_CLINIC_OR_DEPARTMENT_OTHER): Payer: Medicare PPO | Admitting: Internal Medicine

## 2021-12-14 DIAGNOSIS — L97812 Non-pressure chronic ulcer of other part of right lower leg with fat layer exposed: Secondary | ICD-10-CM | POA: Diagnosis not present

## 2021-12-14 DIAGNOSIS — E1151 Type 2 diabetes mellitus with diabetic peripheral angiopathy without gangrene: Secondary | ICD-10-CM | POA: Diagnosis not present

## 2021-12-14 DIAGNOSIS — I480 Paroxysmal atrial fibrillation: Secondary | ICD-10-CM | POA: Diagnosis not present

## 2021-12-14 DIAGNOSIS — L97822 Non-pressure chronic ulcer of other part of left lower leg with fat layer exposed: Secondary | ICD-10-CM | POA: Diagnosis not present

## 2021-12-14 DIAGNOSIS — E11622 Type 2 diabetes mellitus with other skin ulcer: Secondary | ICD-10-CM | POA: Diagnosis not present

## 2021-12-14 DIAGNOSIS — Z7901 Long term (current) use of anticoagulants: Secondary | ICD-10-CM | POA: Diagnosis not present

## 2021-12-14 DIAGNOSIS — I872 Venous insufficiency (chronic) (peripheral): Secondary | ICD-10-CM | POA: Diagnosis not present

## 2021-12-14 DIAGNOSIS — I10 Essential (primary) hypertension: Secondary | ICD-10-CM | POA: Diagnosis not present

## 2021-12-14 NOTE — Progress Notes (Signed)
JESUA, TAMBLYN ([id number]) Visit Report for 12/14/2021 Chief Complaint Document Details Patient Name: [name], [name] Date of Service: 12/14/2021 1:00 PM Medical Record Number: [id number] Patient Account Number: [id number] Date of Birth/Sex: [birth year] (76 y.o. M) Treating RN: Levora Dredge Primary Care Provider: [name] Other Clinician: Referring Provider: [name] Treating Provider/Extender: Yaakov Guthrie in Treatment: 1 Information Obtained from: Patient Chief Complaint Bilateral LE Ulcer Electronic Signature(s) Signed: 12/14/2021 1:58:35 PM By: Kalman Shan DO Entered By: Kalman Shan on 12/14/2021 13:44:20 [name] ([id number]) -------------------------------------------------------------------------------- Debridement Details Patient Name: [name], [name] R. Date of Service: 12/14/2021 1:00 PM Medical Record Number: [id number] Patient Account Number: [id number] Date of Birth/Sex: [birth year] (76 y.o. M) Treating RN: Levora Dredge Primary Care Provider: [name] Other Clinician: Referring Provider: [name] Treating Provider/Extender: Yaakov Guthrie in Treatment: 1 Debridement Performed for Wound #6 Left,Proximal,Medial Lower Leg Assessment: Performed By: Physician Kalman Shan, MD Debridement Type: Debridement Severity of Tissue Pre Debridement: Fat layer exposed Level of Consciousness (Pre- Awake and Alert procedure): Pre-procedure Verification/Time Out Yes - 13:31 Taken: Total Area Debrided (L x W): 2.3 (cm) x 2.2 (cm) = 5.06 (cm) Tissue and other material Viable, Non-Viable, Slough, Subcutaneous, Slough debrided: Level: Skin/Subcutaneous Tissue Debridement Description: Excisional Instrument: Curette Bleeding: Minimum Hemostasis Achieved: Pressure Response to Treatment: Procedure was tolerated well Level of Consciousness (Post- Awake and Alert procedure): Post Debridement Measurements of Total  Wound Length: (cm) 2.2 Width: (cm) 2.3 Depth: (cm) 0.1 Volume: (cm) 0.397 Character of Wound/Ulcer Post Debridement: Stable Severity of Tissue Post Debridement: Fat layer exposed Post Procedure Diagnosis Same as Pre-procedure Electronic Signature(s) Signed: 12/14/2021 1:58:35 PM By: Kalman Shan DO Signed: 12/14/2021 4:18:50 PM By: Levora Dredge Entered By: Levora Dredge on 12/14/2021 13:34:40 [name] ([id number]) -------------------------------------------------------------------------------- Debridement Details Patient Name: [name], [name] R. Date of Service: 12/14/2021 1:00 PM Medical Record Number: [id number] Patient Account Number: [id number] Date of Birth/Sex: 1946/03/06 (76 y.o. M) Treating RN: Levora Dredge Primary Care Provider: [name] Other Clinician: Referring Provider: [name] Treating Provider/Extender: Yaakov Guthrie in Treatment: 1 Debridement Performed for Wound #5 Left,Distal,Medial Lower Leg Assessment: Performed By: Physician Kalman Shan, MD Debridement Type: Debridement Severity of Tissue Pre Debridement: Fat layer exposed Level of Consciousness (Pre- Awake and Alert procedure): Pre-procedure Verification/Time Out Yes - 13:31 Taken: Total Area Debrided (L x W): 0.6 (cm) x 0.7 (cm) = 0.42 (cm) Tissue and other material Viable, Non-Viable, Slough, Subcutaneous, Slough debrided: Level: Skin/Subcutaneous Tissue Debridement Description: Excisional Instrument: Curette Bleeding: Minimum Hemostasis Achieved: Pressure Response to Treatment: Procedure was tolerated well Level of Consciousness (Post- Awake and Alert procedure): Post Debridement Measurements of Total Wound Length: (cm) 0.6 Width: (cm) 0.7 Depth: (cm) 0.1 Volume: (cm) 0.033 Character of Wound/Ulcer Post Debridement: Stable Severity of Tissue Post Debridement: Fat layer exposed Post Procedure Diagnosis Same as Pre-procedure Electronic  Signature(s) Signed: 12/14/2021 1:58:35 PM By: Kalman Shan DO Signed: 12/14/2021 4:18:50 PM By: Levora Dredge Entered By: Levora Dredge on 12/14/2021 13:35:21 [name], [name] ([id number]) -------------------------------------------------------------------------------- Debridement Details Patient Name: [name], [name] R. Date of Service: 12/14/2021 1:00 PM Medical Record Number: [id number] Patient Account Number: [id number] Date of Birth/Sex: Aug 11, 1946 (77 y.o. M) Treating RN: Levora Dredge Primary Care Provider: [name] Other Clinician: Referring Provider: [name] Treating Provider/Extender: Yaakov Guthrie in Treatment: 1 Debridement Performed for Wound #4 Right,Medial Lower Leg Assessment: Performed By: Physician Kalman Shan, MD Debridement Type: Debridement Severity of Tissue Pre Debridement: Fat layer exposed Level of Consciousness (Pre- Awake and Alert  procedure): Pre-procedure Verification/Time Out Yes - 13:31 Taken: Total Area Debrided (L x W): 2 (cm) x 1 (cm) = 2 (cm) Tissue and other material Viable, Non-Viable, Slough, Subcutaneous, Slough debrided: Level: Skin/Subcutaneous Tissue Debridement Description: Excisional Instrument: Curette Bleeding: Minimum Hemostasis Achieved: Pressure Response to Treatment: Procedure was tolerated well Level of Consciousness (Post- Awake and Alert procedure): Post Debridement Measurements of Total Wound Length: (cm) 2 Width: (cm) 1 Depth: (cm) 0.1 Volume: (cm) 0.157 Character of Wound/Ulcer Post Debridement: Stable Severity of Tissue Post Debridement: Fat layer exposed Post Procedure Diagnosis Same as Pre-procedure Electronic Signature(s) Signed: 12/14/2021 1:58:35 PM By: Kalman Shan DO Signed: 12/14/2021 4:18:50 PM By: Levora Dredge Entered By: Levora Dredge on 12/14/2021 13:35:57 [name]  ([id number]) -------------------------------------------------------------------------------- Debridement Details Patient Name: [name], [name] R. Date of Service: 12/14/2021 1:00 PM Medical Record Number: [id number] Patient Account Number: [id number] Date of Birth/Sex: 06-30-1946 (76 y.o. M) Treating RN: Levora Dredge Primary Care Provider: [name] Other Clinician: Referring Provider: [name] Treating Provider/Extender: Yaakov Guthrie in Treatment: 1 Debridement Performed for Wound #7 Right,Proximal Lower Leg Assessment: Performed By: Physician Kalman Shan, MD Debridement Type: Debridement Severity of Tissue Pre Debridement: Fat layer exposed Level of Consciousness (Pre- Awake and Alert procedure): Pre-procedure Verification/Time Out Yes - 13:31 Taken: Total Area Debrided (L x W): 0.5 (cm) x 0.5 (cm) = 0.25 (cm) Tissue and other material Viable, Non-Viable, Slough, Subcutaneous, Slough debrided: Level: Skin/Subcutaneous Tissue Debridement Description: Excisional Instrument: Curette Bleeding: Minimum Hemostasis Achieved: Pressure Response to Treatment: Procedure was tolerated well Level of Consciousness (Post- Awake and Alert procedure): Post Debridement Measurements of Total Wound Length: (cm) 0.5 Width: (cm) 0.5 Depth: (cm) 0.1 Volume: (cm) 0.02 Character of Wound/Ulcer Post Debridement: Stable Severity of Tissue Post Debridement: Fat layer exposed Post Procedure Diagnosis Same as Pre-procedure Electronic Signature(s) Signed: 12/14/2021 1:58:35 PM By: Kalman Shan DO Signed: 12/14/2021 4:18:50 PM By: Levora Dredge Entered By: Levora Dredge on 12/14/2021 13:36:35 JABARI, SWOVELAND ([id number]) -------------------------------------------------------------------------------- HPI Details Patient Name: [name], [name] R. Date of Service: 12/14/2021 1:00 PM Medical Record Number: [id number] Patient Account Number: [id number] Date of  Birth/Sex: Nov 24, 1945 (76 y.o. M) Treating RN: Levora Dredge Primary Care Provider: [name] Other Clinician: Referring Provider: [name] Treating Provider/Extender: Yaakov Guthrie in Treatment: 1 History of Present Illness HPI Description: 76 year old male who has a past medical history of essential hypertension, chronic atrial fibrillation, peripheral vascular disease, nonischemic cardiomyopathy,venous stasis dermatitis, gouty arthropathy, basal cell carcinoma of the right lower extremity, benign prostatic hypertrophy, long-term use of anticoagulation therapy, hyperglycemia and exercise intolerance has never been a smoker. the patient has had a vascular workup over 7 years ago and said everything was normal at that stage. He does not have any chronic problems except for cardiac issues which he sees a cardiologist in [place]. 08/15/2017 -- arterial and venous duplex studies still pending. 08/23/2017 -- venous reflux studies done on 08/13/2017 shows venous incompetence throughout the left lower extremity deep system and focally at the left saphenofemoral junction. 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 No evidence of SVT or DVT in bilateral lower extremities The patient has an appointment at the end of the month to get his arterial duplex study done 09/05/2017 -- the patient was seen at the vein and vascular office yesterday by Angelena Form. ABI studies were notable for medial calcification and the toe brachial indices were normal and bilateral ankle-brachial) waveforms were normal with triphasic flow. After review of his venous studies he  was not a candidate for laser ablation and his lymphedema was to be treated with compression stockings and lymphedema pump pumps 09/12/2017 -- had a low arterial study done at the [facility] -- unable to obtain reliable ABI is due to medial calcification. Bilateral toe indices were  normal with the right being 1.01 and the left being 0.92 and the waveforms were triphasic bilaterally. he did get hold of 30-40 mm compression stockings but is unable to put these on. We will try and get him alternative compression stockings. 09/26/17- he is here in follow up evaluation of a right lower extremity ulcer;he is compliant in wearing compression stocking; ulcer almost epithelialized , anticipate healing next appointment Readmission: 11/17 point upon evaluation patient's wound currently that he is seeing [place] for today is a skin cancerous lesion that was cleared away by his dermatologist on the left medial calf region. He tells me that this is a very similar thing to what he had done previously in fact the last time he saw him in 2018 this was also what was going on at that point. Nonetheless he feels that based on what he seeing currently that this is just having a lot of harder time healing although it is much closer to the surface than what he is experienced in the past. He notes that the initial removal was in June 2022 which was this year this is now November and still has not closed. He does have some edema and definitely I think that there is some venous component to his slow healing here. Also think that we can do something better than Vaseline to try to help with getting this to clear up as quickly as possible. He does have a history of atrial fibrillation and is on Eliquis otherwise he really has no major medical problems that would affect wound healing. 09/07/2021 upon evaluation today patient actually appears to be doing significantly better after having wrapped him last week. Overall I think that this is making significant improvements at this time which is great news. I do not see any evidence of infection which is great news as well. No fevers, chills, nausea, vomiting, or diarrhea. 09/14/2021 upon evaluation today patient appears to be doing well with regard to his leg ulcer.  He has been tolerating the dressing changes and overall I think that he is making excellent progress. I do not see any signs of active infection at this time. 09/21/2021 upon evaluation today patient actually appears to be making good progress with regard to his wound this is again measuring smaller today no debridement seems to be necessary. We have been using a silver collagen dressing and I think that is doing an awesome job. 09/28/2021 upon evaluation today patient appears to be doing well with regard to his leg currently. I do not see any signs of active infection at this time which is great news. No fevers, chills, nausea, vomiting, or diarrhea. I think this wound is very close to complete resolution. 10/12/2021 upon evaluation today patient actually appears to be doing awesome in regard to his leg ulcer. In fact this appears to be completely healed based on what I am seeing currently. I do not see any evidence of active infection locally nor systemically at this time which is also great news. No fevers, chills, nausea, vomiting, or diarrhea. Readmission: 12/07/2021 upon evaluation today patient presents for readmission here in the clinic. He was discharged on 10/12/2021 is completely healed. Unfortunately this has reopened  at this point and he is having continual issues with new blisters over both lower extremities. This is even worse than what we previously saw. Nonetheless we did actually check his ABIs today and it did reveal that his ABIs were 0.55 on the left and 0.57 on the right. Subsequently this is a definite change from his last arterial study which showed that he did have good blood flow at 1.01 on the right and 0.92 on the left and that was right at the beginning of 2019. Nonetheless based on what we see currently I do think he tolerated the 3 layer compression wrap but I do believe that we probably need to get him tested for his arterial flow in order to see where things stand and if  there is something we can do there that would help prevent this from continue to be an ongoing issue. He did not utilize compression socks in the interim from when he was last here till this time. That something is probably going to need lifelong going forward as well. [name], [name] ([id number]) 3/9; patient presents for follow-up. He has no issues or complaints today. He tolerated the compression wrap well. He had ABIs with TBI's done. He denies signs of infection. Electronic Signature(s) Signed: 12/14/2021 1:58:35 PM By: Kalman Shan DO Entered By: Kalman Shan on 12/14/2021 13:44:47 [name], [name] ([id number]) -------------------------------------------------------------------------------- Physical Exam Details Patient Name: [name], [name] R. Date of Service: 12/14/2021 1:00 PM Medical Record Number: [id number] Patient Account Number: [id number] Date of Birth/Sex: Mar 26, 1946 (76 y.o. M) Treating RN: Levora Dredge Primary Care Provider: [name] Other Clinician: Referring Provider: [name] Treating Provider/Extender: Yaakov Guthrie in Treatment: 1 Constitutional . Cardiovascular . Psychiatric . Notes Bilateral lower extremity wounds with granulation tissue and nonviable tissue. No surrounding signs of infection. Good edema control. Hemosiderin staining legs bilaterally. Electronic Signature(s) Signed: 12/14/2021 1:58:35 PM By: Kalman Shan DO Entered By: Kalman Shan on 12/14/2021 13:46:05 Cisar, [name] ([id number]) -------------------------------------------------------------------------------- Physician Orders Details Patient Name: [name], [name] R. Date of Service: 12/14/2021 1:00 PM Medical Record Number: [id number] Patient Account Number: [id number] Date of Birth/Sex: 1946/06/14 (76 y.o. M) Treating RN: Levora Dredge Primary Care Provider: [name] Other Clinician: Referring Provider: [name] Treating  Provider/Extender: Yaakov Guthrie in Treatment: 1 Verbal / Phone Orders: No Diagnosis Coding Follow-up Appointments o Return Appointment in 1 week. o Nurse Visit as needed Bathing/ Shower/ Hygiene o May shower with wound dressing protected with water repellent cover or cast protector. o No tub bath. Anesthetic (Use 'Patient Medications' Section for Anesthetic Order Entry) o Lidocaine applied to wound bed Edema Control - Lymphedema / Segmental Compressive Device / Other o Optional: One layer of unna paste to top of compression wrap (to act as an anchor). o Elevate, Exercise Daily and Avoid Standing for Long Periods of Time. o Elevate legs to the level of the heart and pump ankles as often as possible o Elevate leg(s) parallel to the floor when sitting. Additional Orders / Instructions o Follow Nutritious Diet and Increase Protein Intake Wound Treatment Wound #4 - Lower Leg Wound Laterality: Right, Medial Cleanser: Soap and Water 1 x Per Week/15 Days Discharge Instructions: Gently cleanse wound with antibacterial soap, rinse and pat dry prior to dressing wounds Cleanser: Wound Cleanser 1 x Per Week/15 Days Discharge Instructions: Wash your hands with soap and water. Remove old dressing, discard into plastic bag and place into trash. Cleanse the wound with Wound Cleanser prior to applying a clean dressing  using gauze sponges, not tissues or cotton balls. Do not scrub or use excessive force. Pat dry using gauze sponges, not tissue or cotton balls. Topical: Santyl Collagenase Ointment, 30 (gm), tube 1 x Per Week/15 Days Discharge Instructions: apply nickel thick to wound bed only Primary Dressing: Silvercel Small 2x2 (in/in) 1 x Per Week/15 Days Discharge Instructions: Apply Silvercel Small 2x2 (in/in) as instructed Secondary Dressing: (NON-BORDER) Zetuvit Plus Silicone NON-BORDER 5x5 (in/in) 1 x Per Week/15 Days Compression Wrap: 3-LAYER WRAP - Profore Lite LF  3 Multilayer Compression Bandaging System 1 x Per Week/15 Days Discharge Instructions: Apply 3 multi-layer wrap as prescribed. Wound #5 - Lower Leg Wound Laterality: Left, Medial, Distal Cleanser: Soap and Water 1 x Per Week/15 Days Discharge Instructions: Gently cleanse wound with antibacterial soap, rinse and pat dry prior to dressing wounds Cleanser: Wound Cleanser 1 x Per Week/15 Days Discharge Instructions: Wash your hands with soap and water. Remove old dressing, discard into plastic bag and place into trash. Cleanse the wound with Wound Cleanser prior to applying a clean dressing using gauze sponges, not tissues or cotton balls. Do not scrub or use excessive force. Pat dry using gauze sponges, not tissue or cotton balls. Topical: Santyl Collagenase Ointment, 30 (gm), tube 1 x Per Week/15 Days Discharge Instructions: apply nickel thick to wound bed only Primary Dressing: Silvercel Small 2x2 (in/in) 1 x Per Week/15 Days Discharge Instructions: Apply Silvercel Small 2x2 (in/in) as instructed LINDBERG, ZENON ([id number]) Secondary Dressing: (NON-BORDER) Zetuvit Plus Silicone NON-BORDER 5x5 (in/in) 1 x Per Week/15 Days Compression Wrap: 3-LAYER WRAP - Profore Lite LF 3 Multilayer Compression Bandaging System 1 x Per Week/15 Days Discharge Instructions: Apply 3 multi-layer wrap as prescribed. Wound #6 - Lower Leg Wound Laterality: Left, Medial, Proximal Cleanser: Soap and Water 1 x Per Week/15 Days Discharge Instructions: Gently cleanse wound with antibacterial soap, rinse and pat dry prior to dressing wounds Cleanser: Wound Cleanser 1 x Per Week/15 Days Discharge Instructions: Wash your hands with soap and water. Remove old dressing, discard into plastic bag and place into trash. Cleanse the wound with Wound Cleanser prior to applying a clean dressing using gauze sponges, not tissues or cotton balls. Do not scrub or use excessive force. Pat dry using gauze sponges, not tissue or cotton  balls. Topical: Santyl Collagenase Ointment, 30 (gm), tube 1 x Per Week/15 Days Discharge Instructions: apply nickel thick to wound bed only Primary Dressing: Silvercel Small 2x2 (in/in) 1 x Per Week/15 Days Discharge Instructions: Apply Silvercel Small 2x2 (in/in) as instructed Secondary Dressing: (NON-BORDER) Zetuvit Plus Silicone NON-BORDER 5x5 (in/in) 1 x Per Week/15 Days Compression Wrap: 3-LAYER WRAP - Profore Lite LF 3 Multilayer Compression Bandaging System 1 x Per Week/15 Days Discharge Instructions: Apply 3 multi-layer wrap as prescribed. Wound #7 - Lower Leg Wound Laterality: Right, Proximal Cleanser: Soap and Water 1 x Per Week/15 Days Discharge Instructions: Gently cleanse wound with antibacterial soap, rinse and pat dry prior to dressing wounds Cleanser: Wound Cleanser 1 x Per Week/15 Days Discharge Instructions: Wash your hands with soap and water. Remove old dressing, discard into plastic bag and place into trash. Cleanse the wound with Wound Cleanser prior to applying a clean dressing using gauze sponges, not tissues or cotton balls. Do not scrub or use excessive force. Pat dry using gauze sponges, not tissue or cotton balls. Topical: Santyl Collagenase Ointment, 30 (gm), tube 1 x Per Week/15 Days Discharge Instructions: apply nickel thick to wound bed only Primary Dressing: Silvercel Small 2x2 (in/in)  1 x Per Week/15 Days Discharge Instructions: Apply Silvercel Small 2x2 (in/in) as instructed Secondary Dressing: (NON-BORDER) Zetuvit Plus Silicone NON-BORDER 5x5 (in/in) 1 x Per Week/15 Days Compression Wrap: 3-LAYER WRAP - Profore Lite LF 3 Multilayer Compression Bandaging System 1 x Per Week/15 Days Discharge Instructions: Apply 3 multi-layer wrap as prescribed. Electronic Signature(s) Signed: 12/14/2021 2:37:11 PM By: Kalman Shan DO Signed: 12/14/2021 4:18:50 PM By: Levora Dredge Previous Signature: 12/14/2021 1:58:35 PM Version By: Kalman Shan DO Entered By:  Levora Dredge on 12/14/2021 14:24:51 COTTON, BECKLEY ([id number]) -------------------------------------------------------------------------------- Problem List Details Patient Name: ADELFO, DIEBEL R. Date of Service: 12/14/2021 1:00 PM Medical Record Number: [id number] Patient Account Number: [id number] Date of Birth/Sex: 12-26-1945 (76 y.o. M) Treating RN: Levora Dredge Primary Care Provider: [name] Other Clinician: Referring Provider: [name] Treating Provider/Extender: Yaakov Guthrie in Treatment: 1 Active Problems ICD-10 Encounter Code Description Active Date MDM Diagnosis I87.2 Venous insufficiency (chronic) (peripheral) 12/07/2021 No Yes I73.89 Other specified peripheral vascular diseases 12/07/2021 No Yes L97.822 Non-pressure chronic ulcer of other part of left lower leg with fat layer 12/07/2021 No Yes exposed L97.812 Non-pressure chronic ulcer of other part of right lower leg with fat layer 12/07/2021 No Yes exposed I48.0 Paroxysmal atrial fibrillation 12/07/2021 No Yes I10 Essential (primary) hypertension 12/07/2021 No Yes Inactive Problems Resolved Problems Electronic Signature(s) Signed: 12/14/2021 1:58:35 PM By: Kalman Shan DO Entered By: Kalman Shan on 12/14/2021 13:44:10 Mikels, [name] ([id number]) -------------------------------------------------------------------------------- Progress Note Details Patient Name: [name] Date of Service: 12/14/2021 1:00 PM Medical Record Number: [id number] Patient Account Number: [id number] Date of Birth/Sex: Feb 13, 1946 (76 y.o. M) Treating RN: Levora Dredge Primary Care Provider: [name] Other Clinician: Referring Provider: [name] Treating Provider/Extender: Yaakov Guthrie in Treatment: 1 Subjective Chief Complaint Information obtained from Patient Bilateral LE Ulcer History of Present Illness (HPI) 76 year old male who has a past medical history of essential  hypertension, chronic atrial fibrillation, peripheral vascular disease, nonischemic cardiomyopathy,venous stasis dermatitis, gouty arthropathy, basal cell carcinoma of the right lower extremity, benign prostatic hypertrophy, long- term use of anticoagulation therapy, hyperglycemia and exercise intolerance has never been a smoker. the patient has had a vascular workup over 7 years ago and said everything was normal at that stage. He does not have any chronic problems except for cardiac issues which he sees a cardiologist in [place]. 08/15/2017 -- arterial and venous duplex studies still pending. 08/23/2017 -- venous reflux studies done on 08/13/2017 shows venous incompetence throughout the left lower extremity deep system and focally at the left saphenofemoral junction. 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 No evidence of SVT or DVT in bilateral lower extremities The patient has an appointment at the end of the month to get his arterial duplex study done 09/05/2017 -- the patient was seen at the vein and vascular office yesterday by Angelena Form. ABI studies were notable for medial calcification and the toe brachial indices were normal and bilateral ankle-brachial) waveforms were normal with triphasic flow. After review of his venous studies he was not a candidate for laser ablation and his lymphedema was to be treated with compression stockings and lymphedema pump pumps 09/12/2017 -- had a low arterial study done at the [facility] -- unable to obtain reliable ABI is due to medial calcification. Bilateral toe indices were normal with the right being 1.01 and the left being 0.92 and the waveforms were triphasic bilaterally. he did get hold of 30-40 mm compression stockings but is  unable to put these on. We will try and get him alternative compression stockings. 09/26/17- he is here in follow up evaluation of a right lower extremity ulcer;he is  compliant in wearing compression stocking; ulcer almost epithelialized , anticipate healing next appointment Readmission: 11/17 point upon evaluation patient's wound currently that he is seeing [place] for today is a skin cancerous lesion that was cleared away by his dermatologist on the left medial calf region. He tells me that this is a very similar thing to what he had done previously in fact the last time he saw him in 2018 this was also what was going on at that point. Nonetheless he feels that based on what he seeing currently that this is just having a lot of harder time healing although it is much closer to the surface than what he is experienced in the past. He notes that the initial removal was in June 2022 which was this year this is now November and still has not closed. He does have some edema and definitely I think that there is some venous component to his slow healing here. Also think that we can do something better than Vaseline to try to help with getting this to clear up as quickly as possible. He does have a history of atrial fibrillation and is on Eliquis otherwise he really has no major medical problems that would affect wound healing. 09/07/2021 upon evaluation today patient actually appears to be doing significantly better after having wrapped him last week. Overall I think that this is making significant improvements at this time which is great news. I do not see any evidence of infection which is great news as well. No fevers, chills, nausea, vomiting, or diarrhea. 09/14/2021 upon evaluation today patient appears to be doing well with regard to his leg ulcer. He has been tolerating the dressing changes and overall I think that he is making excellent progress. I do not see any signs of active infection at this time. 09/21/2021 upon evaluation today patient actually appears to be making good progress with regard to his wound this is again measuring smaller today no debridement seems  to be necessary. We have been using a silver collagen dressing and I think that is doing an awesome job. 09/28/2021 upon evaluation today patient appears to be doing well with regard to his leg currently. I do not see any signs of active infection at this time which is great news. No fevers, chills, nausea, vomiting, or diarrhea. I think this wound is very close to complete resolution. 10/12/2021 upon evaluation today patient actually appears to be doing awesome in regard to his leg ulcer. In fact this appears to be completely healed based on what I am seeing currently. I do not see any evidence of active infection locally nor systemically at this time which is also great news. No fevers, chills, nausea, vomiting, or diarrhea. Readmission: 12/07/2021 upon evaluation today patient presents for readmission here in the clinic. He was discharged on 10/12/2021 is completely healed. Unfortunately this has reopened at this point and he is having continual issues with new blisters over both lower extremities. This is even worse than what we previously saw. Nonetheless we did actually check his ABIs today and it did reveal that his ABIs were 0.55 on the left and 0.57 on the right. Subsequently this is a definite change from his last arterial study which showed that he did have good blood flow at 1.01 on the right and 0.92 on the  left and that was right at the beginning of 2019. Nonetheless based on what we see currently I do think he tolerated the 3 layer Lawler, Alijah R. ([id number]) compression wrap but I do believe that we probably need to get him tested for his arterial flow in order to see where things stand and if there is something we can do there that would help prevent this from continue to be an ongoing issue. He did not utilize compression socks in the interim from when he was last here till this time. That something is probably going to need lifelong going forward as well. 3/9; patient presents for  follow-up. He has no issues or complaints today. He tolerated the compression wrap well. He had ABIs with TBI's done. He denies signs of infection. Objective Constitutional Vitals Time Taken: 1:03 PM, Height: 75 in, Weight: 245 lbs, BMI: 30.6, Temperature: 97.9 F, Pulse: 60 bpm, Respiratory Rate: 18 breaths/min, Blood Pressure: 174/71 mmHg. General Notes: Bilateral lower extremity wounds with granulation tissue and nonviable tissue. No surrounding signs of infection. Good edema control. Hemosiderin staining legs bilaterally. Integumentary (Hair, Skin) Wound #4 status is Open. Original cause of wound was Gradually Appeared. The date acquired was: 11/09/2021. The wound has been in treatment 1 weeks. The wound is located on the Right,Medial Lower Leg. The wound measures 2cm length x 1cm width x 0.1cm depth; 1.571cm^2 area and 0.157cm^3 volume. There is Fat Layer (Subcutaneous Tissue) exposed. There is no tunneling or undermining noted. There is a medium amount of serosanguineous drainage noted. There is small (1-33%) red granulation within the wound bed. There is a large (67-100%) amount of necrotic tissue within the wound bed including Eschar and Adherent Slough. Wound #5 status is Open. Original cause of wound was Gradually Appeared. The date acquired was: 11/09/2021. The wound has been in treatment 1 weeks. The wound is located on the Left,Distal,Medial Lower Leg. The wound measures 0.6cm length x 0.7cm width x 0.1cm depth; 0.33cm^2 area and 0.033cm^3 volume. There is Fat Layer (Subcutaneous Tissue) exposed. There is no tunneling or undermining noted. There is a medium amount of serosanguineous drainage noted. There is small (1-33%) red granulation within the wound bed. There is a large (67-100%) amount of necrotic tissue within the wound bed including Eschar and Adherent Slough. Wound #6 status is Open. Original cause of wound was Gradually Appeared. The date acquired was: 11/09/2021. The wound has  been in treatment 1 weeks. The wound is located on the Left,Proximal,Medial Lower Leg. The wound measures 2.3cm length x 2.2cm width x 0.1cm depth; 3.974cm^2 area and 0.397cm^3 volume. There is Fat Layer (Subcutaneous Tissue) exposed. There is no tunneling or undermining noted. There is a medium amount of serosanguineous drainage noted. There is small (1-33%) red granulation within the wound bed. There is a large (67- 100%) amount of necrotic tissue within the wound bed including Eschar and Adherent Slough. Wound #7 status is Open. Original cause of wound was Gradually Appeared. The date acquired was: 12/14/2021. The wound is located on the Right,Proximal Lower Leg. The wound measures 0.5cm length x 0.5cm width x 0.1cm depth; 0.196cm^2 area and 0.02cm^3 volume. There is Fat Layer (Subcutaneous Tissue) exposed. There is no tunneling or undermining noted. There is a medium amount of serosanguineous drainage noted. There is small (1-33%) red granulation within the wound bed. There is a medium (34-66%) amount of necrotic tissue within the wound bed including Adherent Slough. Assessment Active Problems ICD-10 Venous insufficiency (chronic) (peripheral) Other specified peripheral vascular diseases  Non-pressure chronic ulcer of other part of left lower leg with fat layer exposed Non-pressure chronic ulcer of other part of right lower leg with fat layer exposed Paroxysmal atrial fibrillation Essential (primary) hypertension Patient's wounds are stable. No signs of surrounding infection. I debrided nonviable tissue. I recommended Santyl to help with further debridement along with silver alginate under 3 layer compression. ABIs were results were discussed with patient. ABIs were noncompressible. Doppler waveforms and normal TBI's did not suggest any significant decrease in arterial perfusion to the lower extremities bilaterally. GUILHERME, SCHWENKE ([id number]) Procedures Wound #4 Pre-procedure diagnosis  of Wound #4 is a Venous Leg Ulcer located on the Right,Medial Lower Leg .Severity of Tissue Pre Debridement is: Fat layer exposed. There was a Excisional Skin/Subcutaneous Tissue Debridement with a total area of 2 sq cm performed by Kalman Shan, MD. With the following instrument(s): Curette to remove Viable and Non-Viable tissue/material. Material removed includes Subcutaneous Tissue and Slough and. No specimens were taken. A time out was conducted at 13:31, prior to the start of the procedure. A Minimum amount of bleeding was controlled with Pressure. The procedure was tolerated well. Post Debridement Measurements: 2cm length x 1cm width x 0.1cm depth; 0.157cm^3 volume. Character of Wound/Ulcer Post Debridement is stable. Severity of Tissue Post Debridement is: Fat layer exposed. Post procedure Diagnosis Wound #4: Same as Pre-Procedure Wound #5 Pre-procedure diagnosis of Wound #5 is a Venous Leg Ulcer located on the Left,Distal,Medial Lower Leg .Severity of Tissue Pre Debridement is: Fat layer exposed. There was a Excisional Skin/Subcutaneous Tissue Debridement with a total area of 0.42 sq cm performed by Kalman Shan, MD. With the following instrument(s): Curette to remove Viable and Non-Viable tissue/material. Material removed includes Subcutaneous Tissue and Slough and. No specimens were taken. A time out was conducted at 13:31, prior to the start of the procedure. A Minimum amount of bleeding was controlled with Pressure. The procedure was tolerated well. Post Debridement Measurements: 0.6cm length x 0.7cm width x 0.1cm depth; 0.033cm^3 volume. Character of Wound/Ulcer Post Debridement is stable. Severity of Tissue Post Debridement is: Fat layer exposed. Post procedure Diagnosis Wound #5: Same as Pre-Procedure Wound #6 Pre-procedure diagnosis of Wound #6 is a Venous Leg Ulcer located on the Left,Proximal,Medial Lower Leg .Severity of Tissue Pre Debridement is: Fat layer exposed.  There was a Excisional Skin/Subcutaneous Tissue Debridement with a total area of 5.06 sq cm performed by Kalman Shan, MD. With the following instrument(s): Curette to remove Viable and Non-Viable tissue/material. Material removed includes Subcutaneous Tissue and Slough and. No specimens were taken. A time out was conducted at 13:31, prior to the start of the procedure. A Minimum amount of bleeding was controlled with Pressure. The procedure was tolerated well. Post Debridement Measurements: 2.2cm length x 2.3cm width x 0.1cm depth; 0.397cm^3 volume. Character of Wound/Ulcer Post Debridement is stable. Severity of Tissue Post Debridement is: Fat layer exposed. Post procedure Diagnosis Wound #6: Same as Pre-Procedure Wound #7 Pre-procedure diagnosis of Wound #7 is a Venous Leg Ulcer located on the Right,Proximal Lower Leg .Severity of Tissue Pre Debridement is: Fat layer exposed. There was a Excisional Skin/Subcutaneous Tissue Debridement with a total area of 0.25 sq cm performed by Kalman Shan, MD. With the following instrument(s): Curette to remove Viable and Non-Viable tissue/material. Material removed includes Subcutaneous Tissue and Slough and. No specimens were taken. A time out was conducted at 13:31, prior to the start of the procedure. A Minimum amount of bleeding was controlled with Pressure. The procedure was  tolerated well. Post Debridement Measurements: 0.5cm length x 0.5cm width x 0.1cm depth; 0.02cm^3 volume. Character of Wound/Ulcer Post Debridement is stable. Severity of Tissue Post Debridement is: Fat layer exposed. Post procedure Diagnosis Wound #7: Same as Pre-Procedure Plan Follow-up Appointments: Return Appointment in 1 week. Nurse Visit as needed Bathing/ Shower/ Hygiene: May shower with wound dressing protected with water repellent cover or cast protector. No tub bath. Anesthetic (Use 'Patient Medications' Section for Anesthetic Order Entry): Lidocaine applied  to wound bed Edema Control - Lymphedema / Segmental Compressive Device / Other: Optional: One layer of unna paste to top of compression wrap (to act as an anchor). Elevate, Exercise Daily and Avoid Standing for Long Periods of Time. Elevate legs to the level of the heart and pump ankles as often as possible Elevate leg(s) parallel to the floor when sitting. Additional Orders / Instructions: Follow Nutritious Diet and Increase Protein Intake WOUND #4: - Lower Leg Wound Laterality: Right, Medial Cleanser: Soap and Water 1 x Per Week/15 Days Discharge Instructions: Gently cleanse wound with antibacterial soap, rinse and pat dry prior to dressing wounds Cleanser: Wound Cleanser 1 x Per Week/15 Days Discharge Instructions: Wash your hands with soap and water. Remove old dressing, discard into plastic bag and place into trash. Cleanse the wound with Wound Cleanser prior to applying a clean dressing using gauze sponges, not tissues or cotton balls. Do not scrub or use excessive force. Pat dry using gauze sponges, not tissue or cotton balls. Topical: Santyl Collagenase Ointment, 30 (gm), tube 1 x Per Week/15 Days Discharge Instructions: apply nickel thick to wound bed only Primary Dressing: Silvercel Small 2x2 (in/in) 1 x Per Week/15 Days OVID, WITMAN ([id number]) Discharge Instructions: Apply Silvercel Small 2x2 (in/in) as instructed Secondary Dressing: (NON-BORDER) Zetuvit Plus Silicone NON-BORDER 5x5 (in/in) 1 x Per Week/15 Days Compression Wrap: 3-LAYER WRAP - Profore Lite LF 3 Multilayer Compression Bandaging System 1 x Per Week/15 Days Discharge Instructions: Apply 3 multi-layer wrap as prescribed. WOUND #5: - Lower Leg Wound Laterality: Left, Medial, Distal Cleanser: Soap and Water 1 x Per Week/15 Days Discharge Instructions: Gently cleanse wound with antibacterial soap, rinse and pat dry prior to dressing wounds Cleanser: Wound Cleanser 1 x Per Week/15 Days Discharge Instructions:  Wash your hands with soap and water. Remove old dressing, discard into plastic bag and place into trash. Cleanse the wound with Wound Cleanser prior to applying a clean dressing using gauze sponges, not tissues or cotton balls. Do not scrub or use excessive force. Pat dry using gauze sponges, not tissue or cotton balls. Topical: Santyl Collagenase Ointment, 30 (gm), tube 1 x Per Week/15 Days Discharge Instructions: apply nickel thick to wound bed only Primary Dressing: Silvercel Small 2x2 (in/in) 1 x Per Week/15 Days Discharge Instructions: Apply Silvercel Small 2x2 (in/in) as instructed Secondary Dressing: (NON-BORDER) Zetuvit Plus Silicone NON-BORDER 5x5 (in/in) 1 x Per Week/15 Days Compression Wrap: 3-LAYER WRAP - Profore Lite LF 3 Multilayer Compression Bandaging System 1 x Per Week/15 Days Discharge Instructions: Apply 3 multi-layer wrap as prescribed. WOUND #6: - Lower Leg Wound Laterality: Left, Medial, Proximal Cleanser: Soap and Water 1 x Per Week/15 Days Discharge Instructions: Gently cleanse wound with antibacterial soap, rinse and pat dry prior to dressing wounds Cleanser: Wound Cleanser 1 x Per Week/15 Days Discharge Instructions: Wash your hands with soap and water. Remove old dressing, discard into plastic bag and place into trash. Cleanse the wound with Wound Cleanser prior to applying a clean dressing using gauze sponges,  not tissues or cotton balls. Do not scrub or use excessive force. Pat dry using gauze sponges, not tissue or cotton balls. Topical: Santyl Collagenase Ointment, 30 (gm), tube 1 x Per Week/15 Days Discharge Instructions: apply nickel thick to wound bed only Primary Dressing: Silvercel Small 2x2 (in/in) 1 x Per Week/15 Days Discharge Instructions: Apply Silvercel Small 2x2 (in/in) as instructed Secondary Dressing: (NON-BORDER) Zetuvit Plus Silicone NON-BORDER 5x5 (in/in) 1 x Per Week/15 Days Compression Wrap: 3-LAYER WRAP - Profore Lite LF 3 Multilayer  Compression Bandaging System 1 x Per Week/15 Days Discharge Instructions: Apply 3 multi-layer wrap as prescribed. WOUND #7: - Lower Leg Wound Laterality: Right, Proximal Cleanser: Soap and Water 1 x Per Week/15 Days Discharge Instructions: Gently cleanse wound with antibacterial soap, rinse and pat dry prior to dressing wounds Cleanser: Wound Cleanser 1 x Per Week/15 Days Discharge Instructions: Wash your hands with soap and water. Remove old dressing, discard into plastic bag and place into trash. Cleanse the wound with Wound Cleanser prior to applying a clean dressing using gauze sponges, not tissues or cotton balls. Do not scrub or use excessive force. Pat dry using gauze sponges, not tissue or cotton balls. Topical: Santyl Collagenase Ointment, 30 (gm), tube 1 x Per Week/15 Days Discharge Instructions: apply nickel thick to wound bed only Primary Dressing: Silvercel Small 2x2 (in/in) 1 x Per Week/15 Days Discharge Instructions: Apply Silvercel Small 2x2 (in/in) as instructed Secondary Dressing: (NON-BORDER) Zetuvit Plus Silicone NON-BORDER 5x5 (in/in) 1 x Per Week/15 Days Compression Wrap: 3-LAYER WRAP - Profore Lite LF 3 Multilayer Compression Bandaging System 1 x Per Week/15 Days Discharge Instructions: Apply 3 multi-layer wrap as prescribed. 1. In office sharp debridement 2. Silver alginate with Santyl under 3 layer compression 3. Follow-up in 1 week Electronic Signature(s) Signed: 12/14/2021 1:58:35 PM By: Kalman Shan DO Entered By: Kalman Shan on 12/14/2021 13:56:07 [place], [place] ([id number]) -------------------------------------------------------------------------------- SuperBill Details Patient Name: RAHMIR, BEEVER R. Date of Service: 12/14/2021 Medical Record Number: [id number] Patient Account Number: [id number] Date of Birth/Sex: [birth year] (76 y.o. M) Treating RN: Levora Dredge Primary Care Provider: [name] Other Clinician: Referring Provider: [name] Treating Provider/Extender: Yaakov Guthrie in Treatment: 1 Diagnosis Coding ICD-10 Codes Code Description I87.2 Venous insufficiency (chronic) (peripheral) I73.89 Other specified peripheral vascular diseases L97.822 Non-pressure chronic ulcer of other part of left lower leg with fat layer exposed L97.812 Non-pressure chronic ulcer of other part of right lower leg with fat layer exposed I48.0 Paroxysmal atrial fibrillation I10 Essential (primary) hypertension Facility Procedures CPT4 Code: 28638177 Description: 11657 - DEB SUBQ TISSUE 20 SQ CM/< Modifier: Quantity: 1 CPT4 Code: Description: ICD-10 Diagnosis Description L97.822 Non-pressure chronic ulcer of other part of left lower leg with fat laye L97.812 Non-pressure chronic ulcer of other part of right lower leg with fat lay Modifier: r exposed er exposed Quantity: Physician Procedures CPT4 Code: 9038333 Description: 11042 - WC PHYS SUBQ TISS 20 SQ CM Modifier: Quantity: 1 CPT4 Code: Description: ICD-10 Diagnosis Description L97.822 Non-pressure chronic ulcer of other part of left lower leg with fat laye L97.812 Non-pressure chronic ulcer of other part of right lower leg with fat lay Modifier: r exposed er exposed Quantity: Electronic Signature(s) Signed: 12/14/2021 1:58:35 PM By: Kalman Shan DO Entered By: Kalman Shan on 12/14/2021 13:56:26

## 2021-12-14 NOTE — Progress Notes (Signed)
OCTAVION, MOLLENKOPF ([id number]) Visit Report for 12/14/2021 Arrival Information Details Patient Name: [name], [name] Date of Service: 12/14/2021 1:00 PM Medical Record Number: [id number] Patient Account Number: [id number] Date of Birth/Sex: November 21, 1945 (76 y.o. M) Treating RN: Levora Dredge Primary Care Fannye Myer: [name] Other Clinician: Referring Vint Pola: [name] Treating Dotti Busey/Extender: Yaakov Guthrie in Treatment: 1 Visit Information History Since Last Visit Added or deleted any medications: No Patient Arrived: Ambulatory Any new allergies or adverse reactions: No Arrival Time: 13:04 Had a fall or experienced change in No Accompanied By: self activities of daily living that may affect Transfer Assistance: None risk of falls: Patient Identification Verified: Yes Hospitalized since last visit: No Secondary Verification Process Completed: Yes Has Dressing in Place as Prescribed: Yes Patient Requires Transmission-Based No Has Compression in Place as Prescribed: Yes Precautions: Pain Present Now: No Patient Has Alerts: Yes Patient Alerts: Patient on Blood Thinner NOT diabetic Xarelto AVVS ABI 12/13/21 [place] TBI L 0.72; [name] 0.75 Electronic Signature(s) Signed: 12/14/2021 4:18:50 PM By: Levora Dredge Entered By: Levora Dredge on 12/14/2021 13:04:19 [name] ([id number]) -------------------------------------------------------------------------------- Clinic Level of Care Assessment Details Patient Name: [name], [name] Date of Service: 12/14/2021 1:00 PM Medical Record Number: [id number] Patient Account Number: [id number] Date of Birth/Sex: 1945/11/16 (76 y.o. M) Treating RN: Levora Dredge Primary Care Renate Danh: [name] Other Clinician: Referring Sacred Roa: [name] Treating Tanaiya Kolarik/Extender: Yaakov Guthrie in Treatment: 1 Clinic Level of Care Assessment Items TOOL 1 Quantity Score '[]'$  - Use when EandM and Procedure is  performed on INITIAL visit 0 ASSESSMENTS - Nursing Assessment / Reassessment '[]'$  - General Physical Exam (combine w/ comprehensive assessment (listed just below) when performed on new 0 pt. evals) '[]'$  - 0 Comprehensive Assessment (HX, ROS, Risk Assessments, Wounds Hx, etc.) ASSESSMENTS - Wound and Skin Assessment / Reassessment '[]'$  - Dermatologic / Skin Assessment (not related to wound area) 0 ASSESSMENTS - Ostomy and/or Continence Assessment and Care '[]'$  - Incontinence Assessment and Management 0 '[]'$  - 0 Ostomy Care Assessment and Management (repouching, etc.) PROCESS - Coordination of Care '[]'$  - Simple Patient / Family Education for ongoing care 0 '[]'$  - 0 Complex (extensive) Patient / Family Education for ongoing care '[]'$  - 0 Staff obtains Programmer, systems, Records, Test Results / Process Orders '[]'$  - 0 Staff telephones HHA, Nursing Homes / Clarify orders / etc '[]'$  - 0 Routine Transfer to another Facility (non-emergent condition) '[]'$  - 0 Routine Hospital Admission (non-emergent condition) '[]'$  - 0 New Admissions / Biomedical engineer / Ordering NPWT, Apligraf, etc. '[]'$  - 0 Emergency Hospital Admission (emergent condition) PROCESS - Special Needs '[]'$  - Pediatric / Minor Patient Management 0 '[]'$  - 0 Isolation Patient Management '[]'$  - 0 Hearing / Language / Visual special needs '[]'$  - 0 Assessment of Community assistance (transportation, [name], etc.) '[]'$  - 0 Additional assistance / Altered mentation '[]'$  - 0 Support Surface(s) Assessment (bed, cushion, seat, etc.) INTERVENTIONS - Miscellaneous '[]'$  - External ear exam 0 '[]'$  - 0 Patient Transfer (multiple staff / Civil Service fast streamer / Similar devices) '[]'$  - 0 Simple Staple / Suture removal (25 or less) '[]'$  - 0 Complex Staple / Suture removal (26 or more) '[]'$  - 0 Hypo/Hyperglycemic Management (do not check if billed separately) '[]'$  - 0 Ankle / Brachial Index (ABI) - do not check if billed separately Has the patient been seen at the hospital within  the last three years: Yes Total Score: 0 Level Of Care: ____ [name] ([id number]) Electronic Signature(s) Signed: 12/14/2021 4:18:50  PM By: Levora Dredge Entered By: Levora Dredge on 12/14/2021 14:25:01 [name] ([id number]) -------------------------------------------------------------------------------- Encounter Discharge Information Details Patient Name: [name], [name] Date of Service: 12/14/2021 1:00 PM Medical Record Number: [id number] Patient Account Number: [id number] Date of Birth/Sex: 1946/07/21 (76 y.o. M) Treating RN: Levora Dredge Primary Care Kensey Luepke: [name] Other Clinician: Referring Ayo Guarino: [name] Treating Aishi Courts/Extender: Yaakov Guthrie in Treatment: 1 Encounter Discharge Information Items Post Procedure Vitals Discharge Condition: Stable Temperature (F): 97.9 Ambulatory Status: Ambulatory Pulse (bpm): 60 Discharge Destination: Home Respiratory Rate (breaths/min): 18 Transportation: Private Auto Blood Pressure (mmHg): 174/71 Accompanied By: self Schedule Follow-up Appointment: No Clinical Summary of Care: Electronic Signature(s) Signed: 12/14/2021 2:27:35 PM By: Levora Dredge Entered By: Levora Dredge on 12/14/2021 14:27:34 [name] ([id number]) -------------------------------------------------------------------------------- Lower Extremity Assessment Details Patient Name: [name] Date of Service: 12/14/2021 1:00 PM Medical Record Number: [id number] Patient Account Number: [id number] Date of Birth/Sex: [birth year] (76 y.o. M) Treating RN: Levora Dredge Primary Care Jesus Poplin: [name] Other Clinician: Referring Andjela Wickes: [name] Treating Tarvares Lant/Extender: Yaakov Guthrie in Treatment: 1 Edema Assessment Assessed: Shirlyn Goltz: No] Patrice Paradise: No] Edema: [Left: Yes] [Right: Yes] Calf Left: Right: Point of Measurement: 35 cm From Medial Instep 39 cm 36.5 cm Ankle Left:  Right: Point of Measurement: 13 cm From Medial Instep 24.6 cm 24.6 cm Vascular Assessment Pulses: Dorsalis Pedis Palpable: [Left:Yes] [Right:Yes] Electronic Signature(s) Signed: 12/14/2021 4:18:50 PM By: Levora Dredge Entered By: Levora Dredge on 12/14/2021 13:25:04 [name] ([id number]) -------------------------------------------------------------------------------- Multi Wound Chart Details Patient Name: Shela Nevin [name] Date of Service: 12/14/2021 1:00 PM Medical Record Number: [id number] Patient Account Number: [id number] Date of Birth/Sex: 02-11-1946 (76 y.o. 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 Comorbid History: Arrhythmia, Hypertension, Type II Arrhythmia, Hypertension, Type II Arrhythmia, Hypertension, Type II Diabetes, Gout Diabetes, Gout Diabetes, Gout Date Acquired: 11/09/2021 11/09/2021 11/09/2021 Weeks of Treatment: '1 1 1 '$ Wound Status: Open Open Open Wound Recurrence: No No No Measurements L x W x D (cm) 2x1x0.1 0.6x0.7x0.1 2.3x2.2x0.1 Area (cm) : 1.571 0.33 3.974 Volume (cm) : 0.157 0.033 0.397 % Reduction in Area: -257.00% -134.00% -10.00% % Reduction in Volume: -256.80% -135.70% -10.00% Classification: Full Thickness Without Exposed Full Thickness Without Exposed Full Thickness Without Exposed Support Structures Support Structures Support Structures Exudate Amount: Medium Medium  Medium Exudate Type: Serosanguineous Serosanguineous Serosanguineous Exudate Color: red, brown red, brown red, brown Granulation Amount: Small (1-33%) Small (1-33%) Small (1-33%) Granulation Quality: Red Red Red Necrotic Amount: Large (67-100%) Large (67-100%) Large (67-100%) Necrotic Tissue: Eschar, Adherent Slough Eschar, Adherent Carlton Exposed Structures: Fat Layer (Subcutaneous Tissue): Fat Layer (Subcutaneous Tissue): Fat Layer (Subcutaneous Tissue): Yes Yes Yes Fascia: No Fascia: No Fascia: No Tendon: No Tendon: No Tendon: No Muscle: No Muscle: No Muscle: No Joint: No Joint: No Joint: No Bone: No Bone: No Bone: No Epithelialization: None None None Debridement: Debridement - Excisional Debridement - Excisional Debridement - Excisional Pre-procedure Verification/Time 13:31 13:31 13:31 Out Taken: Tissue Debrided: Subcutaneous, Slough Subcutaneous, Slough Subcutaneous, Slough Level: Skin/Subcutaneous Tissue Skin/Subcutaneous Tissue Skin/Subcutaneous Tissue Debridement Area (  No Slough/Fibrino Yes Wound Bed Granulation Amount: Small (1-33%) Exposed Structure Granulation Quality: Red Fascia Exposed: No Necrotic Amount: Large (67-100%) Fat Layer (Subcutaneous Tissue) Exposed: Yes Necrotic Quality: Eschar, Adherent Slough Tendon Exposed: No Muscle Exposed: No Joint Exposed: No Bone Exposed: No Treatment Notes Wound #5 (Lower Leg) Wound Laterality: Left, Medial, Distal Cleanser Soap and Water Discharge Instruction: Gently cleanse wound with antibacterial soap, rinse and pat dry prior to dressing wounds Wound Cleanser Discharge Instruction: Wash your hands with soap and water. Remove old dressing, discard into plastic bag and place into trash. Cleanse the wound with Wound Cleanser prior to applying a clean dressing using gauze sponges, not tissues or cotton balls. Do not JONAEL, PARADISO [name] ([id number]) scrub or use excessive force. Pat dry using gauze sponges, not tissue or cotton balls. Peri-Wound Care Topical Santyl Collagenase Ointment, 30 (gm), tube Discharge Instruction: apply nickel thick to wound bed only Primary Dressing Silvercel Small 2x2 (in/in) Discharge Instruction: Apply Silvercel Small 2x2 (in/in) as instructed Secondary Dressing (NON-BORDER) Zetuvit Plus Silicone NON-BORDER 5x5 (in/in) Secured With Compression Wrap 3-LAYER WRAP - Profore Lite LF 3 Multilayer Compression Bandaging System Discharge  Instruction: Apply 3 multi-layer wrap as prescribed. Compression Stockings Add-Ons Electronic Signature(s) Signed: 12/14/2021 4:18:50 PM By: Levora Dredge Entered By: Levora Dredge on 12/14/2021 13:22:58 CASHTON, HOSLEY ([id number]) -------------------------------------------------------------------------------- Wound Assessment Details Patient Name: YU, PEGGS [name] Date of Service: 12/14/2021 1:00 PM Medical Record Number: [id number] Patient Account Number: [id number] Date of Birth/Sex: Jul 20, 1946 (76 y.o. M) Treating RN: Levora Dredge Primary Care Lukas Pelcher: [name] Other Clinician: Referring Kalaysia Demonbreun: [name] Treating Vesna Kable/Extender: Yaakov Guthrie in Treatment: 1 Wound Status Wound Number: 6 Primary Etiology: Venous Leg Ulcer Wound Location: Left, Proximal, Medial Lower Leg Wound Status: Open Wounding Event: Gradually Appeared Comorbid History: Arrhythmia, Hypertension, Type II Diabetes, Gout Date Acquired: 11/09/2021 Weeks Of Treatment: 1 Clustered Wound: No Photos Wound Measurements Length: (cm) 2.3 Width: (cm) 2.2 Depth: (cm) 0.1 Area: (cm) 3.974 Volume: (cm) 0.397 % Reduction in Area: -10% % Reduction in Volume: -10% Epithelialization: None Tunneling: No Undermining: No Wound Description Classification: Full Thickness Without Exposed Support Structures Exudate Amount: Medium Exudate Type: Serosanguineous Exudate Color: red, brown Foul Odor After Cleansing: No Slough/Fibrino Yes Wound Bed Granulation Amount: Small (1-33%) Exposed Structure Granulation Quality: Red Fascia Exposed: No Necrotic Amount: Large (67-100%) Fat Layer (Subcutaneous Tissue) Exposed: Yes Necrotic Quality: Eschar, Adherent Slough Tendon Exposed: No Muscle Exposed: No Joint Exposed: No Bone Exposed: No Treatment Notes Wound #6 (Lower Leg) Wound Laterality: Left, Medial, Proximal Cleanser Soap and Water Discharge Instruction: Gently cleanse wound with  antibacterial soap, rinse and pat dry prior to dressing wounds Wound Cleanser Discharge Instruction: Wash your hands with soap and water. Remove old dressing, discard into plastic bag and place into trash. Cleanse the wound with Wound Cleanser prior to applying a clean dressing using gauze sponges, not tissues or cotton balls. Do not EULON, ALLNUTT [name] ([id number]) scrub or use excessive force. Pat dry using gauze sponges, not tissue or cotton balls. Peri-Wound Care Topical Santyl Collagenase Ointment, 30 (gm), tube Discharge Instruction: apply nickel thick to wound bed only Primary Dressing Silvercel Small 2x2 (in/in) Discharge Instruction: Apply Silvercel Small 2x2 (in/in) as instructed Secondary Dressing (NON-BORDER) Zetuvit Plus Silicone NON-BORDER 5x5 (in/in) Secured With Compression Wrap 3-LAYER WRAP - Profore Lite LF 3 Multilayer Compression Bandaging System Discharge Instruction: Apply 3 multi-layer wrap as prescribed. Compression Stockings Add-Ons Electronic Signature(s) Signed: 12/14/2021 4:18:50 PM By: Levora Dredge Entered By: Levora Dredge on  OCTAVION, MOLLENKOPF ([id number]) Visit Report for 12/14/2021 Arrival Information Details Patient Name: [name], [name] Date of Service: 12/14/2021 1:00 PM Medical Record Number: [id number] Patient Account Number: [id number] Date of Birth/Sex: November 21, 1945 (76 y.o. M) Treating RN: Levora Dredge Primary Care Fannye Myer: [name] Other Clinician: Referring Vint Pola: [name] Treating Dotti Busey/Extender: Yaakov Guthrie in Treatment: 1 Visit Information History Since Last Visit Added or deleted any medications: No Patient Arrived: Ambulatory Any new allergies or adverse reactions: No Arrival Time: 13:04 Had a fall or experienced change in No Accompanied By: self activities of daily living that may affect Transfer Assistance: None risk of falls: Patient Identification Verified: Yes Hospitalized since last visit: No Secondary Verification Process Completed: Yes Has Dressing in Place as Prescribed: Yes Patient Requires Transmission-Based No Has Compression in Place as Prescribed: Yes Precautions: Pain Present Now: No Patient Has Alerts: Yes Patient Alerts: Patient on Blood Thinner NOT diabetic Xarelto AVVS ABI 12/13/21 [place] TBI L 0.72; [name] 0.75 Electronic Signature(s) Signed: 12/14/2021 4:18:50 PM By: Levora Dredge Entered By: Levora Dredge on 12/14/2021 13:04:19 [name] ([id number]) -------------------------------------------------------------------------------- Clinic Level of Care Assessment Details Patient Name: [name], [name] Date of Service: 12/14/2021 1:00 PM Medical Record Number: [id number] Patient Account Number: [id number] Date of Birth/Sex: 1945/11/16 (76 y.o. M) Treating RN: Levora Dredge Primary Care Renate Danh: [name] Other Clinician: Referring Sacred Roa: [name] Treating Tanaiya Kolarik/Extender: Yaakov Guthrie in Treatment: 1 Clinic Level of Care Assessment Items TOOL 1 Quantity Score '[]'$  - Use when EandM and Procedure is  performed on INITIAL visit 0 ASSESSMENTS - Nursing Assessment / Reassessment '[]'$  - General Physical Exam (combine w/ comprehensive assessment (listed just below) when performed on new 0 pt. evals) '[]'$  - 0 Comprehensive Assessment (HX, ROS, Risk Assessments, Wounds Hx, etc.) ASSESSMENTS - Wound and Skin Assessment / Reassessment '[]'$  - Dermatologic / Skin Assessment (not related to wound area) 0 ASSESSMENTS - Ostomy and/or Continence Assessment and Care '[]'$  - Incontinence Assessment and Management 0 '[]'$  - 0 Ostomy Care Assessment and Management (repouching, etc.) PROCESS - Coordination of Care '[]'$  - Simple Patient / Family Education for ongoing care 0 '[]'$  - 0 Complex (extensive) Patient / Family Education for ongoing care '[]'$  - 0 Staff obtains Programmer, systems, Records, Test Results / Process Orders '[]'$  - 0 Staff telephones HHA, Nursing Homes / Clarify orders / etc '[]'$  - 0 Routine Transfer to another Facility (non-emergent condition) '[]'$  - 0 Routine Hospital Admission (non-emergent condition) '[]'$  - 0 New Admissions / Biomedical engineer / Ordering NPWT, Apligraf, etc. '[]'$  - 0 Emergency Hospital Admission (emergent condition) PROCESS - Special Needs '[]'$  - Pediatric / Minor Patient Management 0 '[]'$  - 0 Isolation Patient Management '[]'$  - 0 Hearing / Language / Visual special needs '[]'$  - 0 Assessment of Community assistance (transportation, [name], etc.) '[]'$  - 0 Additional assistance / Altered mentation '[]'$  - 0 Support Surface(s) Assessment (bed, cushion, seat, etc.) INTERVENTIONS - Miscellaneous '[]'$  - External ear exam 0 '[]'$  - 0 Patient Transfer (multiple staff / Civil Service fast streamer / Similar devices) '[]'$  - 0 Simple Staple / Suture removal (25 or less) '[]'$  - 0 Complex Staple / Suture removal (26 or more) '[]'$  - 0 Hypo/Hyperglycemic Management (do not check if billed separately) '[]'$  - 0 Ankle / Brachial Index (ABI) - do not check if billed separately Has the patient been seen at the hospital within  the last three years: Yes Total Score: 0 Level Of Care: ____ [name] ([id number]) Electronic Signature(s) Signed: 12/14/2021 4:18:50  PM By: Levora Dredge Entered By: Levora Dredge on 12/14/2021 14:25:01 [name] ([id number]) -------------------------------------------------------------------------------- Encounter Discharge Information Details Patient Name: [name], [name] Date of Service: 12/14/2021 1:00 PM Medical Record Number: [id number] Patient Account Number: [id number] Date of Birth/Sex: 1946/07/21 (76 y.o. M) Treating RN: Levora Dredge Primary Care Kensey Luepke: [name] Other Clinician: Referring Ayo Guarino: [name] Treating Aishi Courts/Extender: Yaakov Guthrie in Treatment: 1 Encounter Discharge Information Items Post Procedure Vitals Discharge Condition: Stable Temperature (F): 97.9 Ambulatory Status: Ambulatory Pulse (bpm): 60 Discharge Destination: Home Respiratory Rate (breaths/min): 18 Transportation: Private Auto Blood Pressure (mmHg): 174/71 Accompanied By: self Schedule Follow-up Appointment: No Clinical Summary of Care: Electronic Signature(s) Signed: 12/14/2021 2:27:35 PM By: Levora Dredge Entered By: Levora Dredge on 12/14/2021 14:27:34 [name] ([id number]) -------------------------------------------------------------------------------- Lower Extremity Assessment Details Patient Name: [name] Date of Service: 12/14/2021 1:00 PM Medical Record Number: [id number] Patient Account Number: [id number] Date of Birth/Sex: [birth year] (76 y.o. M) Treating RN: Levora Dredge Primary Care Jesus Poplin: [name] Other Clinician: Referring Andjela Wickes: [name] Treating Tarvares Lant/Extender: Yaakov Guthrie in Treatment: 1 Edema Assessment Assessed: Shirlyn Goltz: No] Patrice Paradise: No] Edema: [Left: Yes] [Right: Yes] Calf Left: Right: Point of Measurement: 35 cm From Medial Instep 39 cm 36.5 cm Ankle Left:  Right: Point of Measurement: 13 cm From Medial Instep 24.6 cm 24.6 cm Vascular Assessment Pulses: Dorsalis Pedis Palpable: [Left:Yes] [Right:Yes] Electronic Signature(s) Signed: 12/14/2021 4:18:50 PM By: Levora Dredge Entered By: Levora Dredge on 12/14/2021 13:25:04 [name] ([id number]) -------------------------------------------------------------------------------- Multi Wound Chart Details Patient Name: Shela Nevin [name] Date of Service: 12/14/2021 1:00 PM Medical Record Number: [id number] Patient Account Number: [id number] Date of Birth/Sex: 02-11-1946 (76 y.o. 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 Comorbid History: Arrhythmia, Hypertension, Type II Arrhythmia, Hypertension, Type II Arrhythmia, Hypertension, Type II Diabetes, Gout Diabetes, Gout Diabetes, Gout Date Acquired: 11/09/2021 11/09/2021 11/09/2021 Weeks of Treatment: '1 1 1 '$ Wound Status: Open Open Open Wound Recurrence: No No No Measurements L x W x D (cm) 2x1x0.1 0.6x0.7x0.1 2.3x2.2x0.1 Area (cm) : 1.571 0.33 3.974 Volume (cm) : 0.157 0.033 0.397 % Reduction in Area: -257.00% -134.00% -10.00% % Reduction in Volume: -256.80% -135.70% -10.00% Classification: Full Thickness Without Exposed Full Thickness Without Exposed Full Thickness Without Exposed Support Structures Support Structures Support Structures Exudate Amount: Medium Medium  Medium Exudate Type: Serosanguineous Serosanguineous Serosanguineous Exudate Color: red, brown red, brown red, brown Granulation Amount: Small (1-33%) Small (1-33%) Small (1-33%) Granulation Quality: Red Red Red Necrotic Amount: Large (67-100%) Large (67-100%) Large (67-100%) Necrotic Tissue: Eschar, Adherent Slough Eschar, Adherent Carlton Exposed Structures: Fat Layer (Subcutaneous Tissue): Fat Layer (Subcutaneous Tissue): Fat Layer (Subcutaneous Tissue): Yes Yes Yes Fascia: No Fascia: No Fascia: No Tendon: No Tendon: No Tendon: No Muscle: No Muscle: No Muscle: No Joint: No Joint: No Joint: No Bone: No Bone: No Bone: No Epithelialization: None None None Debridement: Debridement - Excisional Debridement - Excisional Debridement - Excisional Pre-procedure Verification/Time 13:31 13:31 13:31 Out Taken: Tissue Debrided: Subcutaneous, Slough Subcutaneous, Slough Subcutaneous, Slough Level: Skin/Subcutaneous Tissue Skin/Subcutaneous Tissue Skin/Subcutaneous Tissue Debridement Area (  No Slough/Fibrino Yes Wound Bed Granulation Amount: Small (1-33%) Exposed Structure Granulation Quality: Red Fascia Exposed: No Necrotic Amount: Large (67-100%) Fat Layer (Subcutaneous Tissue) Exposed: Yes Necrotic Quality: Eschar, Adherent Slough Tendon Exposed: No Muscle Exposed: No Joint Exposed: No Bone Exposed: No Treatment Notes Wound #5 (Lower Leg) Wound Laterality: Left, Medial, Distal Cleanser Soap and Water Discharge Instruction: Gently cleanse wound with antibacterial soap, rinse and pat dry prior to dressing wounds Wound Cleanser Discharge Instruction: Wash your hands with soap and water. Remove old dressing, discard into plastic bag and place into trash. Cleanse the wound with Wound Cleanser prior to applying a clean dressing using gauze sponges, not tissues or cotton balls. Do not JONAEL, PARADISO [name] ([id number]) scrub or use excessive force. Pat dry using gauze sponges, not tissue or cotton balls. Peri-Wound Care Topical Santyl Collagenase Ointment, 30 (gm), tube Discharge Instruction: apply nickel thick to wound bed only Primary Dressing Silvercel Small 2x2 (in/in) Discharge Instruction: Apply Silvercel Small 2x2 (in/in) as instructed Secondary Dressing (NON-BORDER) Zetuvit Plus Silicone NON-BORDER 5x5 (in/in) Secured With Compression Wrap 3-LAYER WRAP - Profore Lite LF 3 Multilayer Compression Bandaging System Discharge  Instruction: Apply 3 multi-layer wrap as prescribed. Compression Stockings Add-Ons Electronic Signature(s) Signed: 12/14/2021 4:18:50 PM By: Levora Dredge Entered By: Levora Dredge on 12/14/2021 13:22:58 CASHTON, HOSLEY ([id number]) -------------------------------------------------------------------------------- Wound Assessment Details Patient Name: YU, PEGGS [name] Date of Service: 12/14/2021 1:00 PM Medical Record Number: [id number] Patient Account Number: [id number] Date of Birth/Sex: Jul 20, 1946 (76 y.o. M) Treating RN: Levora Dredge Primary Care Lukas Pelcher: [name] Other Clinician: Referring Kalaysia Demonbreun: [name] Treating Vesna Kable/Extender: Yaakov Guthrie in Treatment: 1 Wound Status Wound Number: 6 Primary Etiology: Venous Leg Ulcer Wound Location: Left, Proximal, Medial Lower Leg Wound Status: Open Wounding Event: Gradually Appeared Comorbid History: Arrhythmia, Hypertension, Type II Diabetes, Gout Date Acquired: 11/09/2021 Weeks Of Treatment: 1 Clustered Wound: No Photos Wound Measurements Length: (cm) 2.3 Width: (cm) 2.2 Depth: (cm) 0.1 Area: (cm) 3.974 Volume: (cm) 0.397 % Reduction in Area: -10% % Reduction in Volume: -10% Epithelialization: None Tunneling: No Undermining: No Wound Description Classification: Full Thickness Without Exposed Support Structures Exudate Amount: Medium Exudate Type: Serosanguineous Exudate Color: red, brown Foul Odor After Cleansing: No Slough/Fibrino Yes Wound Bed Granulation Amount: Small (1-33%) Exposed Structure Granulation Quality: Red Fascia Exposed: No Necrotic Amount: Large (67-100%) Fat Layer (Subcutaneous Tissue) Exposed: Yes Necrotic Quality: Eschar, Adherent Slough Tendon Exposed: No Muscle Exposed: No Joint Exposed: No Bone Exposed: No Treatment Notes Wound #6 (Lower Leg) Wound Laterality: Left, Medial, Proximal Cleanser Soap and Water Discharge Instruction: Gently cleanse wound with  antibacterial soap, rinse and pat dry prior to dressing wounds Wound Cleanser Discharge Instruction: Wash your hands with soap and water. Remove old dressing, discard into plastic bag and place into trash. Cleanse the wound with Wound Cleanser prior to applying a clean dressing using gauze sponges, not tissues or cotton balls. Do not EULON, ALLNUTT [name] ([id number]) scrub or use excessive force. Pat dry using gauze sponges, not tissue or cotton balls. Peri-Wound Care Topical Santyl Collagenase Ointment, 30 (gm), tube Discharge Instruction: apply nickel thick to wound bed only Primary Dressing Silvercel Small 2x2 (in/in) Discharge Instruction: Apply Silvercel Small 2x2 (in/in) as instructed Secondary Dressing (NON-BORDER) Zetuvit Plus Silicone NON-BORDER 5x5 (in/in) Secured With Compression Wrap 3-LAYER WRAP - Profore Lite LF 3 Multilayer Compression Bandaging System Discharge Instruction: Apply 3 multi-layer wrap as prescribed. Compression Stockings Add-Ons Electronic Signature(s) Signed: 12/14/2021 4:18:50 PM By: Levora Dredge Entered By: Levora Dredge on

## 2021-12-21 ENCOUNTER — Other Ambulatory Visit: Payer: Self-pay

## 2021-12-21 ENCOUNTER — Encounter: Payer: Medicare PPO | Admitting: Physician Assistant

## 2021-12-21 DIAGNOSIS — I480 Paroxysmal atrial fibrillation: Secondary | ICD-10-CM | POA: Diagnosis not present

## 2021-12-21 DIAGNOSIS — E1151 Type 2 diabetes mellitus with diabetic peripheral angiopathy without gangrene: Secondary | ICD-10-CM | POA: Diagnosis not present

## 2021-12-21 DIAGNOSIS — Z7901 Long term (current) use of anticoagulants: Secondary | ICD-10-CM | POA: Diagnosis not present

## 2021-12-21 DIAGNOSIS — I872 Venous insufficiency (chronic) (peripheral): Secondary | ICD-10-CM | POA: Diagnosis not present

## 2021-12-21 DIAGNOSIS — E11622 Type 2 diabetes mellitus with other skin ulcer: Secondary | ICD-10-CM | POA: Diagnosis not present

## 2021-12-21 DIAGNOSIS — L97822 Non-pressure chronic ulcer of other part of left lower leg with fat layer exposed: Secondary | ICD-10-CM | POA: Diagnosis not present

## 2021-12-21 DIAGNOSIS — L97812 Non-pressure chronic ulcer of other part of right lower leg with fat layer exposed: Secondary | ICD-10-CM | POA: Diagnosis not present

## 2021-12-21 DIAGNOSIS — I10 Essential (primary) hypertension: Secondary | ICD-10-CM | POA: Diagnosis not present

## 2021-12-21 NOTE — Progress Notes (Addendum)
[name] ([id number]) ?Visit Report for 12/21/2021 ?Chief Complaint Document Details ?Patient Name: [name], [name] ?Date of Service: 12/21/2021 10:30 AM ?Medical Record Number: [id number] ?Patient Account Number: [id number] ?Date of Birth/Sex: 17-Jun-1946 (76 y.o. M) ?Treating RN: Levora Dredge ?Primary Care Provider: [name] Other Clinician: ?Referring Provider: [name] ?Treating Provider/Extender: Jeri Cos ?Weeks in Treatment: 2 ?Information Obtained from: Patient ?Chief Complaint ?Bilateral LE Ulcer ?Electronic Signature(s) ?Signed: 12/21/2021 10:34:20 AM By: [name] PA-C ?Entered By: [name] on 12/21/2021 10:34:20 ?SIMMIE, CAMERER R. ([id number]) ?-------------------------------------------------------------------------------- ?Debridement Details ?Patient Name: [name] ?Date of Service: 12/21/2021 10:30 AM ?Medical Record Number: [id number] ?Patient Account Number: [id number] ?Date of Birth/Sex: August 20, 1946 (76 y.o. M) ?Treating RN: Levora Dredge ?Primary Care Provider: [name] Other Clinician: ?Referring Provider: [name] ?Treating Provider/Extender: Jeri Cos ?Weeks in Treatment: 2 ?Debridement Performed for ?Wound #7 Right,Proximal Lower Leg ?Assessment: ?Performed By: Physician Tommie Sams., PA-C ?Debridement Type: Debridement ?Severity of Tissue Pre Debridement: Fat layer exposed ?Level of Consciousness (Pre- ?Awake and Alert ?procedure): ?Pre-procedure Verification/Time Out ?Yes - 10:54 ?Taken: ?Total Area Debrided (L x W): 0.8 (cm) x 0.4 (cm) = 0.32 (cm?) ?Tissue and other material ?Viable, Non-Viable, Slough, Subcutaneous, Biofilm, Slough ?debrided: ?Level: Skin/Subcutaneous Tissue ?Debridement Description: Excisional ?Instrument: Curette ?Bleeding: Moderate ?Hemostasis Achieved: Pressure ?Response to Treatment: Procedure was tolerated well ?Level of Consciousness (Post- ?Awake and Alert ?procedure): ?Post Debridement Measurements of Total  Wound ?Length: (cm) 0.8 ?Width: (cm) 0.4 ?Depth: (cm) 0.1 ?Volume: (cm?) 0.025 ?Character of Wound/Ulcer Post Debridement: Stable ?Severity of Tissue Post Debridement: Fat layer exposed ?Post Procedure Diagnosis ?Same as Pre-procedure ?Electronic Signature(s) ?Signed: 12/21/2021 4:54:52 PM By: Levora Dredge ?Signed: 12/22/2021 5:45:40 PM By: [name] PA-C ?Entered By: Levora Dredge on 12/21/2021 10:55:41 ?CASTEN, FLOREN R. ([id number]) ?-------------------------------------------------------------------------------- ?Debridement Details ?Patient Name: [name] ?Date of Service: 12/21/2021 10:30 AM ?Medical Record Number: [id number] ?Patient Account Number: [id number] ?Date of Birth/Sex: 08-23-1946 (76 y.o. M) ?Treating RN: Levora Dredge ?Primary Care Provider: [name] Other Clinician: ?Referring Provider: [name] ?Treating Provider/Extender: Jeri Cos ?Weeks in Treatment: 2 ?Debridement Performed for ?Wound #4 Right,Medial Lower Leg ?Assessment: ?Performed By: Physician Tommie Sams., PA-C ?Debridement Type: Debridement ?Severity of Tissue Pre Debridement: Fat layer exposed ?Level of Consciousness (Pre- ?Awake and Alert ?procedure): ?Pre-procedure Verification/Time Out ?Yes - 10:54 ?Taken: ?Total Area Debrided (L x W): 0.5 (cm) x 0.7 (cm) = 0.35 (cm?) ?Tissue and other material ?Viable, Non-Viable, Slough, Subcutaneous, Biofilm, Slough ?debrided: ?Level: Skin/Subcutaneous Tissue ?Debridement Description: Excisional ?Instrument: Curette ?Bleeding: Moderate ?Hemostasis Achieved: Pressure ?Response to Treatment: Procedure was tolerated well ?Level of Consciousness (Post- ?Awake and Alert ?procedure): ?Post Debridement Measurements of Total Wound ?Length: (cm) 0.5 ?Width: (cm) 0.7 ?Depth: (cm) 0.1 ?Volume: (cm?) 0.027 ?Character of Wound/Ulcer Post Debridement: Stable ?Severity of Tissue Post Debridement: Fat layer exposed ?Post Procedure Diagnosis ?Same as Pre-procedure ?Electronic  Signature(s) ?Signed: 12/21/2021 4:54:52 PM By: Levora Dredge ?Signed: 12/22/2021 5:45:40 PM By: [name] PA-C ?Entered By: Levora Dredge on 12/21/2021 10:56:25 ?CHAMAR, BROUGHTON R. ([id number]) ?-------------------------------------------------------------------------------- ?Debridement Details ?Patient Name: [name] ?Date of Service: 12/21/2021 10:30 AM ?Medical Record Number: [id number] ?Patient Account Number: [id number] ?Date of Birth/Sex: 1946-05-20 (76 y.o. M) ?Treating RN: Levora Dredge ?Primary Care Provider: [name] Other Clinician: ?Referring Provider: [name] ?Treating Provider/Extender: Jeri Cos ?Weeks in Treatment: 2 ?Debridement Performed for ?Wound #6 Left,Proximal,Medial Lower Leg ?Assessment: ?Performed By: Physician Tommie Sams., PA-C ?Debridement Type: Debridement ?Severity of Tissue Pre Debridement: Fat  layer exposed ?Level of Consciousness (Pre- ?Awake and Alert ?procedure): ?Pre-procedure Verification/Time Out ?Yes - 10:54 ?Taken: ?Total Area Debrided (L x W): 1.6 (cm) x 1.7 (cm) = 2.72 (cm?) ?Tissue and other material ?Viable, Non-Viable, Slough, Subcutaneous, Biofilm, Slough ?debrided: ?Level: Skin/Subcutaneous Tissue ?Debridement Description: Excisional ?Instrument: Curette ?Bleeding: Moderate ?Hemostasis Achieved: Pressure ?Response to Treatment: Procedure was tolerated well ?Level of Consciousness (Post- ?Awake and Alert ?procedure): ?Post Debridement Measurements of Total Wound ?Length: (cm) 1.6 ?Width: (cm) 1.7 ?Depth: (cm) 0.1 ?Volume: (cm?) 0.214 ?Character of Wound/Ulcer Post Debridement: Stable ?Severity of Tissue Post Debridement: Fat layer exposed ?Post Procedure Diagnosis ?Same as Pre-procedure ?Electronic Signature(s) ?Signed: 12/21/2021 4:54:52 PM By: Levora Dredge ?Signed: 12/22/2021 5:45:40 PM By: [name] PA-C ?Entered By: Levora Dredge on 12/21/2021 10:57:05 ?OLON, RUSS R.  ([id number]) ?-------------------------------------------------------------------------------- ?Debridement Details ?Patient Name: [name] ?Date of Service: 12/21/2021 10:30 AM ?Medical Record Number: [id number] ?Patient Account Number: [id number] ?Date of Birth/Sex: [birth year] (76 y.o. M) ?Treating RN: Levora Dredge ?Primary Care Provider: [name] Other Clinician: ?Referring Provider: [name] ?Treating Provider/Extender: Jeri Cos ?Weeks in Treatment: 2 ?Debridement Performed for ?Wound #5 Left,Distal,Medial Lower Leg ?Assessment: ?Performed By: Physician Tommie Sams., PA-C ?Debridement Type: Debridement ?Severity of Tissue Pre Debridement: Fat layer exposed ?Level of Consciousness (Pre- ?Awake and Alert ?procedure): ?Pre-procedure Verification/Time Out ?Yes - 10:54 ?Taken: ?Total Area Debrided (L x W): 0.9 (cm) x 0.7 (cm) = 0.63 (cm?) ?Tissue and other material ?Viable, Non-Viable, Slough, Subcutaneous, Biofilm, Slough ?debrided: ?Level: Skin/Subcutaneous Tissue ?Debridement Description: Excisional ?Instrument: Curette ?Bleeding: Moderate ?Hemostasis Achieved: Pressure ?Response to Treatment: Procedure was tolerated well ?Level of Consciousness (Post- ?Awake and Alert ?procedure): ?Post Debridement Measurements of Total Wound ?Length: (cm) 0.9 ?Width: (cm) 0.7 ?Depth: (cm) 0.1 ?Volume: (cm?) 0.049 ?Character of Wound/Ulcer Post Debridement: Stable ?Severity of Tissue Post Debridement: Fat layer exposed ?Post Procedure Diagnosis ?Same as Pre-procedure ?Electronic Signature(s) ?Signed: 12/21/2021 4:54:52 PM By: Levora Dredge ?Signed: 12/22/2021 5:45:40 PM By: [name] PA-C ?Entered By: Levora Dredge on 12/21/2021 10:57:43 ?ALVERTO, SHEDD ([id number]) ?-------------------------------------------------------------------------------- ?HPI Details ?Patient Name: REA, RESER ?Date of Service: 12/21/2021 10:30 AM ?Medical Record Number: [id number] ?Patient Account Number:  [id number] ?Date of Birth/Sex: Aug 01, 1946 (76 y.o. M) ?Treating RN: Levora Dredge ?Primary Care Provider: [name] Other Clinician: ?Referring Provider: [name] ?Treating Provider/Extender: Jeri Cos ?Weeks in Treatment: 2 ?History of Present Illness ?HPI D

## 2021-12-21 NOTE — Progress Notes (Signed)
[name] ([id number]) ?Visit Report for 12/21/2021 ?Arrival Information Details ?Patient Name: [name], [name] ?Date of Service: 12/21/2021 10:30 AM ?Medical Record Number: [id number] ?Patient Account Number: [id number] ?Date of Birth/Sex: [birth year] (76 y.o. M) ?Treating RN: Angelina Pih ?Primary Care Murel Shenberger: [name] Other Clinician: ?Referring Dalonte Hardage: [name] ?Treating Ascher Schroepfer/Extender: Allen Derry ?Weeks in Treatment: 2 ?Visit Information History Since Last Visit ?Added or deleted any medications: No ?Patient Arrived: Ambulatory ?Any new allergies or adverse reactions: No ?Arrival Time: 10:23 ?Had a fall or experienced change in No ?Accompanied By: self ?activities of daily living that may affect ?Transfer Assistance: None ?risk of falls: ?Patient Identification Verified: Yes ?Hospitalized since last visit: No ?Secondary Verification Process Completed: Yes ?Has Dressing in Place as Prescribed: Yes ?Patient Requires Transmission-Based No ?Has Compression in Place as Prescribed: Yes ?Precautions: ?Pain Present Now: No ?Patient Has Alerts: Yes ?Patient Alerts: Patient on Blood ?Thinner ?NOT diabetic ?Xarelto ?AVVS ABI 12/13/21 [place] ?TBI L 0.72; R 0.75 ?Electronic Signature(s) ?Signed: 12/21/2021 4:54:52 PM By: Angelina Pih ?Entered By: Angelina Pih on 12/21/2021 10:25:11 ?HALEN, KILBY R. ([id number]) ?-------------------------------------------------------------------------------- ?Clinic Level of Care Assessment Details ?Patient Name: [name] ?Date of Service: 12/21/2021 10:30 AM ?Medical Record Number: [id number] ?Patient Account Number: [id number] ?Date of Birth/Sex: Oct 15, 1945 (76 y.o. M) ?Treating RN: Angelina Pih ?Primary Care Stanely Sexson: [name] Other Clinician: ?Referring Bionca Mckey: [name] ?Treating Kani Chauvin/Extender: Allen Derry ?Weeks in Treatment: 2 ?Clinic Level of Care Assessment Items ?TOOL 1 Quantity Score ?[]  - Use when EandM and Procedure is  performed on INITIAL visit 0 ?ASSESSMENTS - Nursing Assessment / Reassessment ?[]  - General Physical Exam (combine w/ comprehensive assessment (listed just below) when performed on new ?0 ?pt. evals) ?[]  - 0 ?Comprehensive Assessment (HX, ROS, Risk Assessments, Wounds Hx, etc.) ?ASSESSMENTS - Wound and Skin Assessment / Reassessment ?[]  - Dermatologic / Skin Assessment (not related to wound area) 0 ?ASSESSMENTS - Ostomy and/or Continence Assessment and Care ?[]  - Incontinence Assessment and Management 0 ?[]  - 0 ?Ostomy Care Assessment and Management (repouching, etc.) ?PROCESS - Coordination of Care ?[]  - Simple Patient / Family Education for ongoing care 0 ?[]  - 0 ?Complex (extensive) Patient / Family Education for ongoing care ?[]  - 0 ?Staff obtains Consents, Records, Test Results / Process Orders ?[]  - 0 ?Staff telephones HHA, Nursing Homes / Clarify orders / etc ?[]  - 0 ?Routine Transfer to another Facility (non-emergent condition) ?[]  - 0 ?Routine Hospital Admission (non-emergent condition) ?[]  - 0 ?New Admissions / Manufacturing engineer / Ordering NPWT, Apligraf, etc. ?[]  - 0 ?Emergency Hospital Admission (emergent condition) ?PROCESS - Special Needs ?[]  - Pediatric / Minor Patient Management 0 ?[]  - 0 ?Isolation Patient Management ?[]  - 0 ?Hearing / Language / Visual special needs ?[]  - 0 ?Assessment of Community assistance (transportation, [name], etc.) ?[]  - 0 ?Additional assistance / Altered mentation ?[]  - 0 ?Support Surface(s) Assessment (bed, cushion, seat, etc.) ?INTERVENTIONS - Miscellaneous ?[]  - External ear exam 0 ?[]  - 0 ?Patient Transfer (multiple staff / Nurse, adult / Similar devices) ?[]  - 0 ?Simple Staple / Suture removal (25 or less) ?[]  - 0 ?Complex Staple / Suture removal (26 or more) ?[]  - 0 ?Hypo/Hyperglycemic Management (do not check if billed separately) ?[]  - 0 ?Ankle / Brachial Index (ABI) - do not check if billed separately ?Has the patient been seen at the hospital within  the last three years: Yes ?Total Score: 0 ?Level Of Care: ____ ?DERRIOUS, RENNELS ([id number]) ?Electronic Signature(s) ?Signed: 12/21/2021 4:54:52  PM By: Angelina Pih ?Entered By: Angelina Pih on 12/21/2021 10:58:45 ?ALMANZO, REDLER R. ([id number]) ?-------------------------------------------------------------------------------- ?Encounter Discharge Information Details ?Patient Name: [name] ?Date of Service: 12/21/2021 10:30 AM ?Medical Record Number: [id number] ?Patient Account Number: [id number] ?Date of Birth/Sex: [birth year] (76 y.o. M) ?Treating RN: Angelina Pih ?Primary Care Dierre Crevier: [name] Other Clinician: ?Referring Brayant Dorr: [name] ?Treating Deantae Shackleton/Extender: Allen Derry ?Weeks in Treatment: 2 ?Encounter Discharge Information Items Post Procedure Vitals ?Discharge Condition: Stable ?Temperature (?F): 97.4 ?Ambulatory Status: Ambulatory ?Pulse (bpm): 78 ?Discharge Destination: Home ?Respiratory Rate (breaths/min): 18 ?Transportation: Private Auto ?Blood Pressure (mmHg): 177/90 ?Accompanied By: self ?Schedule Follow-up Appointment: Yes ?Clinical Summary of Care: ?Electronic Signature(s) ?Signed: 12/21/2021 4:54:52 PM By: Angelina Pih ?Entered By: Angelina Pih on 12/21/2021 11:15:10 ?RIGBY, LAQUE R. ([id number]) ?-------------------------------------------------------------------------------- ?Lower Extremity Assessment Details ?Patient Name: [name] ?Date of Service: 12/21/2021 10:30 AM ?Medical Record Number: [id number] ?Patient Account Number: [id number] ?Date of Birth/Sex: 20-Jul-1946 (76 y.o. M) ?Treating RN: Angelina Pih ?Primary Care Emmani Lesueur: [name] Other Clinician: ?Referring Idrees Quam: [name] ?Treating Adelee Hannula/Extender: Allen Derry ?Weeks in Treatment: 2 ?Edema Assessment ?Assessed: [Left: No] [Right: No] ?Edema: [Left: Yes] [Right: Yes] ?Calf ?Left: Right: ?Point of Measurement: 35 cm From Medial Instep 38 cm 34.8 cm ?Ankle ?Left:  Right: ?Point of Measurement: 13 cm From Medial Instep 23.5 cm 23.5 cm ?Vascular Assessment ?Pulses: ?Dorsalis Pedis ?Palpable: [Left:Yes] [Right:Yes] ?Electronic Signature(s) ?Signed: 12/21/2021 4:54:52 PM By: Angelina Pih ?Entered By: Angelina Pih on 12/21/2021 10:43:05 ?DEMETRION, BLUNK R. ([id number]) ?-------------------------------------------------------------------------------- ?Multi Wound Chart Details ?Patient Name: JANSON, SICOTTE ?Date of Service: 12/21/2021 10:30 AM ?Medical Record Number: [id number] ?Patient Account Number: [id number] ?Date of Birth/Sex: 1946/02/24 (76 y.o. 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 Leg Ulcer ?Comorbid History: Arrhythmia, Hypertension, Type II Arrhythmia, Hypertension, Type II Arrhythmia, Hypertension, Type II ?Diabetes, Gout Diabetes, Gout Diabetes, Gout ?Date Acquired: 11/09/2021 11/09/2021 11/09/2021 ?Weeks of Treatment: 2 2 2  ?Wound Status: Open Open Open ?Wound Recurrence: No No No ?Measurements L x W x D (cm) 0.5x0.7x0.1 0.9x0.7x0.1 1.6x1.7x0.1 ?Area (cm?) : 0.275 0.495 2.136 ?Volume (cm?) : 0.027 0.049 0.214 ?% Reduction in Area: 37.50% -251.10% 40.90% ?% Reduction in Volume: 38.60% -250.00% 40.70% ?Classification: Full Thickness Without Exposed Full Thickness Without Exposed Full Thickness Without Exposed ?Support Structures Support Structures Support Structures ?Exudate Amount: Medium Medium  Medium ?Exudate Type: Serosanguineous Serosanguineous Serosanguineous ?Exudate Color: red, brown red, brown red, brown ?Granulation Amount: Medium (34-66%) Medium (34-66%) Small (1-33%) ?Granulation Quality: Red Red Red ?Necroti

## 2021-12-26 ENCOUNTER — Other Ambulatory Visit: Payer: Self-pay | Admitting: Cardiology

## 2021-12-28 ENCOUNTER — Encounter: Payer: Medicare PPO | Admitting: Physician Assistant

## 2021-12-28 ENCOUNTER — Other Ambulatory Visit: Payer: Self-pay

## 2021-12-28 DIAGNOSIS — I10 Essential (primary) hypertension: Secondary | ICD-10-CM | POA: Diagnosis not present

## 2021-12-28 DIAGNOSIS — E11622 Type 2 diabetes mellitus with other skin ulcer: Secondary | ICD-10-CM | POA: Diagnosis not present

## 2021-12-28 DIAGNOSIS — L97822 Non-pressure chronic ulcer of other part of left lower leg with fat layer exposed: Secondary | ICD-10-CM | POA: Diagnosis not present

## 2021-12-28 DIAGNOSIS — E1151 Type 2 diabetes mellitus with diabetic peripheral angiopathy without gangrene: Secondary | ICD-10-CM | POA: Diagnosis not present

## 2021-12-28 DIAGNOSIS — L97812 Non-pressure chronic ulcer of other part of right lower leg with fat layer exposed: Secondary | ICD-10-CM | POA: Diagnosis not present

## 2021-12-28 DIAGNOSIS — I480 Paroxysmal atrial fibrillation: Secondary | ICD-10-CM | POA: Diagnosis not present

## 2021-12-28 DIAGNOSIS — I872 Venous insufficiency (chronic) (peripheral): Secondary | ICD-10-CM | POA: Diagnosis not present

## 2021-12-28 DIAGNOSIS — Z7901 Long term (current) use of anticoagulants: Secondary | ICD-10-CM | POA: Diagnosis not present

## 2021-12-28 NOTE — Progress Notes (Signed)
[name], [name] ([id number]) ?Visit Report for 12/28/2021 ?Arrival Information Details ?Patient Name: [name], [name] ?Date of Service: 12/28/2021 1:00 PM ?Medical Record Number: [id number] ?Patient Account Number: [id number] ?Date of Birth/Sex: 15-Nov-1945 (76 y.o. M) ?Treating RN: Hansel Feinstein ?Primary Care Addysen Louth: [name] Other Clinician: ?Referring Vidhi Delellis: [name] ?Treating Kyeshia Zinn/Extender: Allen Derry ?Weeks in Treatment: 3 ?Visit Information History Since Last Visit ?Added or deleted any medications: No ?Patient Arrived: Ambulatory ?Had a fall or experienced change in No ?Arrival Time: 13:03 ?activities of daily living that may affect ?Accompanied By: self ?risk of falls: ?Transfer Assistance: None ?Hospitalized since last visit: No ?Patient Identification Verified: Yes ?Has Dressing in Place as Prescribed: Yes ?Secondary Verification Process Completed: Yes ?Has Compression in Place as Prescribed: Yes ?Patient Requires Transmission-Based No ?Pain Present Now: No ?Precautions: ?Patient Has Alerts: Yes ?Patient Alerts: Patient on Blood ?Thinner ?NOT diabetic ?Xarelto ?[facility] ABI 12/13/21 [place] ?TBI L 0.72; R 0.75 ?Electronic Signature(s) ?Signed: 12/28/2021 4:16:55 PM By: Hansel Feinstein ?Entered ByHansel Feinstein on 12/28/2021 13:06:22 ?[name], [name] R. ([id number]) ?-------------------------------------------------------------------------------- ?Compression Therapy Details ?Patient Name: [name], [name] ?Date of Service: 12/28/2021 1:00 PM ?Medical Record Number: [id number] ?Patient Account Number: [id number] ?Date of Birth/Sex: 10-10-1945 (76 y.o. M) ?Treating RN: Hansel Feinstein ?Primary Care Nadine Ryle: [name] Other Clinician: ?Referring Yordan Martindale: [name] ?Treating Pearlean Sabina/Extender: Allen Derry ?Weeks in Treatment: 3 ?Compression Therapy Performed for Wound Assessment: Wound #6 Left,Proximal,Medial Lower Leg ?Performed By: Clinician Hansel Feinstein, RN ?Compression Type: Three Layer ?Post Procedure  Diagnosis ?Same as Pre-procedure ?Electronic Signature(s) ?Signed: 12/28/2021 4:16:55 PM By: Hansel Feinstein ?Entered ByHansel Feinstein on 12/28/2021 13:30:38 ?[name], [name] R. ([id number]) ?-------------------------------------------------------------------------------- ?Compression Therapy Details ?Patient Name: [name], [name] ?Date of Service: 12/28/2021 1:00 PM ?Medical Record Number: [id number] ?Patient Account Number: [id number] ?Date of Birth/Sex: 06-08-1946 (76 y.o. M) ?Treating RN: Hansel Feinstein ?Primary Care Evalene Vath: [name] Other Clinician: ?Referring Rondey Fallen: [name] ?Treating Jaala Bohle/Extender: Allen Derry ?Weeks in Treatment: 3 ?Compression Therapy Performed for Wound Assessment: Wound #4 Right,Medial Lower Leg ?Performed By: Clinician Hansel Feinstein, RN ?Compression Type: Three Layer ?Post Procedure Diagnosis ?Same as Pre-procedure ?Electronic Signature(s) ?Signed: 12/28/2021 4:16:55 PM By: Hansel Feinstein ?Entered ByHansel Feinstein on 12/28/2021 13:30:38 ?[name], [name] R. ([id number]) ?-------------------------------------------------------------------------------- ?Encounter Discharge Information Details ?Patient Name: [name], [name] ?Date of Service: 12/28/2021 1:00 PM ?Medical Record Number: [id number] ?Patient Account Number: [id number] ?Date of Birth/Sex: Jul 24, 1946 (76 y.o. M) ?Treating RN: Hansel Feinstein ?Primary Care Rahcel Shutes: [name] Other Clinician: ?Referring [name] Caples: [name] ?Treating Lexandra Rettke/Extender: Allen Derry ?Weeks in Treatment: 3 ?Encounter Discharge Information Items ?Discharge Condition: Stable ?Ambulatory Status: Ambulatory ?Discharge Destination: Home ?Transportation: Private Auto ?Accompanied By: self ?Schedule Follow-up Appointment: Yes ?Clinical Summary of Care: ?Electronic Signature(s) ?Signed: 12/28/2021 4:16:55 PM By: Hansel Feinstein ?Entered ByHansel Feinstein on 12/28/2021 13:33:21 ?[name], [name] R.  ([id number]) ?-------------------------------------------------------------------------------- ?Lower Extremity Assessment Details ?Patient Name: [name], [name] ?Date of Service: 12/28/2021 1:00 PM ?Medical Record Number: [id number] ?Patient Account Number: [id number] ?Date of Birth/Sex: [birth year] (76 y.o. M) ?Treating RN: Hansel Feinstein ?Primary Care Lirio Bach: [name] Other Clinician: ?Referring Emmalia Heyboer: [name] ?Treating Jessey Stehlin/Extender: Allen Derry ?Weeks in Treatment: 3 ?Edema Assessment ?Assessed: [Left: Yes] [Right: Yes] ?[Left: Edema] [Right: :] ?Calf ?Left: Right: ?Point of Measurement: 35 cm From Medial Instep 38 cm 36 cm ?Ankle ?Left: Right: ?Point of Measurement: 13 cm From Medial Instep 23.5 cm 23.5 cm ?Vascular Assessment ?Pulses: ?Dorsalis Pedis ?Palpable: [Left:Yes] [Right:Yes] ?Electronic Signature(s) ?Signed: 12/28/2021 4:16:55 PM By: Hansel Feinstein ?Entered By: Hansel Feinstein  on 12/28/2021 13:21:15 ?[name], [name] R. ([id number]) ?-------------------------------------------------------------------------------- ?Multi Wound Chart Details ?Patient Name: [name], COUNTESS ?Date of Service: 12/28/2021 1:00 PM ?Medical Record Number: [id number] ?Patient Account Number: [id number] ?Date of Birth/Sex: Jun 27, 1946 (76 y.o. 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 ?Comorbid History: Arrhythmia, Hypertension, Type II Arrhythmia,  Hypertension, Type II Arrhythmia, Hypertension, Type II ?Diabetes, Gout Diabetes, Gout Diabetes, Gout ?Date Acquired: 11/09/2021 11/09/2021 11/09/2021 ?Weeks of Treatment: 3 3 3  ?Wound Status: Open Open Open ?Wound Recurrence: No No No ?Measurements L x W x D (cm) 0.4x0.3x0.1 0.2x0.1x0.1 1.3x1.3x0.1 ?Area (cm?) : 0.094 0.016 1.327 ?Volume (cm?) : 0.009 0.002 0.133 ?% Reduction in Area: 78.60% 88.70% 63.30% ?% Reduction in Volume: 79.50% 85.70% 63.20% ?Classification: Full Thickness Without Exposed Full Thickness Without Exposed Full Thickness Without Exposed ?Support Structures Support Structures Support Structures ?Exudate Amount: Medium Medium Medium ?Exudate Type: Serosanguineous Serosanguineous Serosanguineous ?Exudate Color: red, brown red, brown red, brown ?Granulation Amount: Large (67-100%) Medium (34-66%) Large (67-100%) ?Granulation Quality: Red Red Red, Hyper-granulation ?Necrotic Amount: Small (1-33%) Medium (34-66%) Small (1-33%) ?Necrotic Tissue: Eschar, Adherent [employer], Adherent [employer] ?Exposed Structures: ?Fat Layer (Subcutaneous Tissue): ?Fat Layer (Subcutaneous Tissue): Fat Layer (Subcutaneous Tissue): ?Yes Yes Yes ?Fascia: No ?Fascia: No ?Fascia: No ?Tendon: No ?Tendon: No ?Tendon: No ?Muscle: No ?Muscle: No ?Muscle: No ?Joint: No ?Joint: No ?Joint: No ?Bone: No ?Bone: No ?Bone: No ?Epithelialization: None None None ?Wound Number: 7 [name] [name] ?Photos: ?[name] [name] ?TYRIAN, INFANTE ([id number]) ?Wound Location: Right, Proximal Lower Leg [name] [name] ?Wounding Event: Gradually Appeared [name] [name] ?Primary Etiology: Venous Leg Ulcer [name] [name] ?Comorbid History: Arrhythmia, Hypertension, Type II [name] [name] ?Diabetes, Gout ?Date Acquired: 12/14/2021 [name] [name] ?Weeks of Treatment: 2 [name] [name] ?Wound Status: Open [name] [name] ?Wound Recurrence: No [name] [name] ?Measurements L x W x D (cm) 0.3x0.3x0.1 [name] [name] ?Area (cm?) : 0.071 [name] [name] ?Volume (cm?) : 0.007 [name] [name] ?% Reduction in Area: 63.80% [name] [name] ?% Reduction in  Volume: 65.00% [name] [name] ?Classification: Full Thickness Without Exposed [name] [name] ?Support Structures ?Exudate Amount: Medium [name] [name] ?Exudate Type: Serosanguineous [name] [name] ?Exudate Color: red, brown [name] [name] ?Granulation Amount: Medium (34-66%) [name] [name] ?Granulation Quality: Red [name] N/

## 2021-12-28 NOTE — Progress Notes (Addendum)
[name], [name] ([id number]) ?Visit Report for 12/28/2021 ?Chief Complaint Document Details ?Patient Name: [name], [name] ?Date of Service: 12/28/2021 1:00 PM ?Medical Record Number: [id number] ?Patient Account Number: [id number] ?Date of Birth/Sex: 17-Apr-1946 (76 y.o. M) ?Treating RN: Donnamarie Poag ?Primary Care Provider: [name] Other Clinician: ?Referring Provider: [name] ?Treating Provider/Extender: Jeri Cos ?Weeks in Treatment: 3 ?Information Obtained from: Patient ?Chief Complaint ?Bilateral LE Ulcer ?Electronic Signature(s) ?Signed: 12/28/2021 1:26:28 PM By: [name] PA-C ?Entered By: [name] on 12/28/2021 13:26:28 ?[name], [name] ([id number]) ?-------------------------------------------------------------------------------- ?HPI Details ?Patient Name: [name], [name] ?Date of Service: 12/28/2021 1:00 PM ?Medical Record Number: [id number] ?Patient Account Number: [id number] ?Date of Birth/Sex: 1946-07-24 (76 y.o. M) ?Treating RN: Donnamarie Poag ?Primary Care Provider: [name] Other Clinician: ?Referring Provider: [name] ?Treating Provider/Extender: Jeri Cos ?Weeks in Treatment: 3 ?History of Present Illness ?HPI Description: 76 year old male who has a past medical history of essential hypertension, chronic atrial fibrillation, peripheral vascular ?disease, nonischemic cardiomyopathy,venous stasis dermatitis, gouty arthropathy, basal cell carcinoma of the right lower extremity, benign ?prostatic hypertrophy, long-term use of anticoagulation therapy, hyperglycemia and exercise intolerance has never been a smoker. ?the patient has had a vascular workup over 7 years ago and said everything was normal at that stage. He does not have any chronic problems ?except for cardiac issues which he sees a cardiologist in [place]. ?08/15/2017 -- arterial and venous duplex studies still pending. ?08/23/2017 -- venous reflux studies done on 08/13/2017 shows venous incompetence  throughout the left lower extremity deep system and ?focally at the left saphenofemoral junction. 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 No evidence of SVT or DVT in bilateral ?lower extremities ?The patient has an appointment at the end of the month to get his arterial duplex study done ?09/05/2017 -- the patient was seen at the vein and vascular office yesterday by Angelena Form. ABI studies were notable for medial calcification ?and the toe brachial indices were normal and bilateral ankle-brachial) waveforms were normal with triphasic flow. After review of his venous ?studies he was not a candidate for laser ablation and his lymphedema was to be treated with compression stockings and lymphedema pump ?pumps ?09/12/2017 -- had a low arterial study done at the [facility] -- unable to obtain reliable ABI is due to medial ?calcification. Bilateral toe indices were normal with the right being 1.01 and the left being 0.92 and the waveforms were triphasic bilaterally. ?he did get hold of 30-40 mm compression stockings but is unable to put these on. We will try and get him alternative compression stockings. ?09/26/17- he is here in follow up evaluation of a right lower extremity ulcer;he is compliant in wearing compression stocking; ulcer almost ?epithelialized , anticipate healing next appointment ?Readmission: ?11/17 point upon evaluation patient's wound currently that he is seeing [place] for today is a skin cancerous lesion that was cleared away by his ?dermatologist on the left medial calf region. He tells me that this is a very similar thing to what he had done previously in fact the last time he ?saw him in 2018 this was also what was going on at that point. Nonetheless he feels that based on what he seeing currently that this is just ?having a lot of harder time healing although it is much closer to the surface than what he is experienced in the past. He notes that the  initial ?removal was in June 2022 which was this year this is now  November and still has not closed. He does have some edema and definitely I think that ?there is some venous component to his slow healing here. Also think that we can do something better than Vaseline to try to help with getting this ?to clear up as quickly as possible. He does have a history of atrial fibrillation and is on Eliquis otherwise he really has no major medical problems ?that would affect wound healing. ?09/07/2021 upon evaluation today patient actually appears to be doing significantly better after having wrapped him last week. Overall I think that ?this is making significant improvements at this time which is great news. I do not see any evidence of infection which is great news as well. No ?fevers, chills, nausea, vomiting, or diarrhea. ?09/14/2021 upon evaluation today patient appears to be doing well with regard to his leg ulcer. He has been tolerating the dressing changes and ?overall I think that he is making excellent progress. I do not see any signs of active infection at this time. ?09/21/2021 upon evaluation today patient actually appears to be making good progress with regard to his wound this is again measuring smaller ?today no debridement seems to be necessary. We have been using a silver collagen dressing and I think that is doing an awesome job. ?09/28/2021 upon evaluation today patient appears to be doing well with regard to his leg currently. I do not see any signs of active infection at ?this time which is great news. No fevers, chills, nausea, vomiting, or diarrhea. I think this wound is very close to complete resolution. ?10/12/2021 upon evaluation today patient actually appears to be doing awesome in regard to his leg ulcer. In fact this appears to be completely ?healed based on what I am seeing currently. I do not see any evidence of active infection locally nor systemically at this time which is also great ?news. No  fevers, chills, nausea, vomiting, or diarrhea. ?Readmission: ?12/07/2021 upon evaluation today patient presents for readmission here in the clinic. He was discharged on 10/12/2021 is completely healed. ?Unfortunately this has reopened at this point and he is having continual issues with new blisters over both lower extremities. This is even worse ?than what we previously saw. Nonetheless we did actually check his ABIs today and it did reveal that his ABIs were 0.55 on the left and 0.57 on ?the right. Subsequently this is a definite change from his last arterial study which showed that he did have good blood flow at 1.01 on the right ?and 0.92 on the left and that was right at the beginning of 2019. Nonetheless based on what we see currently I do think he tolerated the 3 layer ?compression wrap but I do believe that we probably need to get him tested for his arterial flow in order to see where things stand and if there is ?something we can do there that would help prevent this from continue to be an ongoing issue. He did not utilize compression socks in the interim ?from when he was last here till this time. That something is probably going to need lifelong going forward as well. ?3/9; patient presents for follow-up. He has no issues or complaints today. He tolerated the compression wrap well. He had ABIs with TBI's done. [name], [name] ([id number]) ?He denies signs of infection. ?12/21/2021 upon evaluation today patient appears to be doing well with regard to the wounds on his legs. Both are showing signs of significant ?improvement which is great news although I do  believe some sharp debridement would be of benefit here as well. ?12/28/2021 upon evaluation today patient appears to be doing well with regard to his wounds. Everything is showing signs of excellent ?improvement which I am very pleased about. I think that we are headed in the right direction here. Fortunately there does not appear to be any ?evidence of  infection which is great news there is a little bit of hypergranulation. ?Electronic Signature(s) ?Signed: 12/28/2021 1:38:50 PM By: [name] PA-C ?Entered By: [name] on 12/28/2021 13:38:50 ?[name]

## 2022-01-04 ENCOUNTER — Encounter: Payer: Medicare PPO | Admitting: Physician Assistant

## 2022-01-04 DIAGNOSIS — I480 Paroxysmal atrial fibrillation: Secondary | ICD-10-CM | POA: Diagnosis not present

## 2022-01-04 DIAGNOSIS — I872 Venous insufficiency (chronic) (peripheral): Secondary | ICD-10-CM | POA: Diagnosis not present

## 2022-01-04 DIAGNOSIS — E11622 Type 2 diabetes mellitus with other skin ulcer: Secondary | ICD-10-CM | POA: Diagnosis not present

## 2022-01-04 DIAGNOSIS — L97822 Non-pressure chronic ulcer of other part of left lower leg with fat layer exposed: Secondary | ICD-10-CM | POA: Diagnosis not present

## 2022-01-04 DIAGNOSIS — Z7901 Long term (current) use of anticoagulants: Secondary | ICD-10-CM | POA: Diagnosis not present

## 2022-01-04 DIAGNOSIS — E1151 Type 2 diabetes mellitus with diabetic peripheral angiopathy without gangrene: Secondary | ICD-10-CM | POA: Diagnosis not present

## 2022-01-04 DIAGNOSIS — I10 Essential (primary) hypertension: Secondary | ICD-10-CM | POA: Diagnosis not present

## 2022-01-04 DIAGNOSIS — L97812 Non-pressure chronic ulcer of other part of right lower leg with fat layer exposed: Secondary | ICD-10-CM | POA: Diagnosis not present

## 2022-01-04 NOTE — Progress Notes (Addendum)
VIR, WHETSTINE ([id number]) ?Visit Report for 01/04/2022 ?Chief Complaint Document Details ?Patient Name: [name], [name] ?Date of Service: 01/04/2022 1:45 PM ?Medical Record Number: [id number] ?Patient Account Number: [id number] ?Date of Birth/Sex: 12-01-1945 (76 y.o. M) ?Treating RN: Donnamarie Poag ?Primary Care Provider: [name] Other Clinician: ?Referring Provider: [name] ?Treating Provider/Extender: Jeri Cos ?Weeks in Treatment: 4 ?Information Obtained from: Patient ?Chief Complaint ?Bilateral LE Ulcer ?Electronic Signature(s) ?Signed: 01/04/2022 1:57:14 PM By: [name] PA-C ?Entered By: [name] on 01/04/2022 13:57:14 ?[name], [name] ([id number]) ?-------------------------------------------------------------------------------- ?HPI Details ?Patient Name: [name], [name] ?Date of Service: 01/04/2022 1:45 PM ?Medical Record Number: [id number] ?Patient Account Number: [id number] ?Date of Birth/Sex: [birth year] (76 y.o. M) ?Treating RN: Donnamarie Poag ?Primary Care Provider: [name] Other Clinician: ?Referring Provider: [name] ?Treating Provider/Extender: Jeri Cos ?Weeks in Treatment: 4 ?History of Present Illness ?HPI Description: 76 year old male who has a past medical history of essential hypertension, chronic atrial fibrillation, peripheral vascular ?disease, nonischemic cardiomyopathy,venous stasis dermatitis, gouty arthropathy, basal cell carcinoma of the right lower extremity, benign ?prostatic hypertrophy, long-term use of anticoagulation therapy, hyperglycemia and exercise intolerance has never been a smoker. ?the patient has had a vascular workup over 7 years ago and said everything was normal at that stage. He does not have any chronic problems ?except for cardiac issues which he sees a cardiologist in [place]. ?08/15/2017 -- arterial and venous duplex studies still pending. ?08/23/2017 -- venous reflux studies done on 08/13/2017 shows venous incompetence  throughout the left lower extremity deep system and ?focally at the left saphenofemoral junction. 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 No evidence of SVT or DVT in bilateral ?lower extremities ?The patient has an appointment at the end of the month to get his arterial duplex study done ?09/05/2017 -- the patient was seen at the vein and vascular office yesterday by Angelena Form. ABI studies were notable for medial calcification ?and the toe brachial indices were normal and bilateral ankle-brachial) waveforms were normal with triphasic flow. After review of his venous ?studies he was not a candidate for laser ablation and his lymphedema was to be treated with compression stockings and lymphedema pump ?pumps ?09/12/2017 -- had a low arterial study done at the [facility] -- unable to obtain reliable ABI is due to medial ?calcification. Bilateral toe indices were normal with the right being 1.01 and the left being 0.92 and the waveforms were triphasic bilaterally. ?he did get hold of 30-40 mm compression stockings but is unable to put these on. We will try and get him alternative compression stockings. ?09/26/17- he is here in follow up evaluation of a right lower extremity ulcer;he is compliant in wearing compression stocking; ulcer almost ?epithelialized , anticipate healing next appointment ?Readmission: ?11/17 point upon evaluation patient's wound currently that he is seeing [place] for today is a skin cancerous lesion that was cleared away by his ?dermatologist on the left medial calf region. He tells me that this is a very similar thing to what he had done previously in fact the last time he ?saw him in 2018 this was also what was going on at that point. Nonetheless he feels that based on what he seeing currently that this is just ?having a lot of harder time healing although it is much closer to the surface than what he is experienced in the past. He notes that the  initial ?removal was in June 2022 which was this year this is now  November and still has not closed. He does have some edema and definitely I think that ?there is some venous component to his slow healing here. Also think that we can do something better than Vaseline to try to help with getting this ?to clear up as quickly as possible. He does have a history of atrial fibrillation and is on Eliquis otherwise he really has no major medical problems ?that would affect wound healing. ?09/07/2021 upon evaluation today patient actually appears to be doing significantly better after having wrapped him last week. Overall I think that ?this is making significant improvements at this time which is great news. I do not see any evidence of infection which is great news as well. No ?fevers, chills, nausea, vomiting, or diarrhea. ?09/14/2021 upon evaluation today patient appears to be doing well with regard to his leg ulcer. He has been tolerating the dressing changes and ?overall I think that he is making excellent progress. I do not see any signs of active infection at this time. ?09/21/2021 upon evaluation today patient actually appears to be making good progress with regard to his wound this is again measuring smaller ?today no debridement seems to be necessary. We have been using a silver collagen dressing and I think that is doing an awesome job. ?09/28/2021 upon evaluation today patient appears to be doing well with regard to his leg currently. I do not see any signs of active infection at ?this time which is great news. No fevers, chills, nausea, vomiting, or diarrhea. I think this wound is very close to complete resolution. ?10/12/2021 upon evaluation today patient actually appears to be doing awesome in regard to his leg ulcer. In fact this appears to be completely ?healed based on what I am seeing currently. I do not see any evidence of active infection locally nor systemically at this time which is also great ?news. No  fevers, chills, nausea, vomiting, or diarrhea. ?Readmission: ?12/07/2021 upon evaluation today patient presents for readmission here in the clinic. He was discharged on 10/12/2021 is completely healed. ?Unfortunately this has reopened at this point and he is having continual issues with new blisters over both lower extremities. This is even worse ?than what we previously saw. Nonetheless we did actually check his ABIs today and it did reveal that his ABIs were 0.55 on the left and 0.57 on ?the right. Subsequently this is a definite change from his last arterial study which showed that he did have good blood flow at 1.01 on the right ?and 0.92 on the left and that was right at the beginning of 2019. Nonetheless based on what we see currently I do think he tolerated the 3 layer ?compression wrap but I do believe that we probably need to get him tested for his arterial flow in order to see where things stand and if there is ?something we can do there that would help prevent this from continue to be an ongoing issue. He did not utilize compression socks in the interim ?from when he was last here till this time. That something is probably going to need lifelong going forward as well. ?3/9; patient presents for follow-up. He has no issues or complaints today. He tolerated the compression wrap well. He had ABIs with TBI's done. [name], [name] ([id number]) ?He denies signs of infection. ?12/21/2021 upon evaluation today patient appears to be doing well with regard to the wounds on his legs. Both are showing signs of significant ?improvement which is great news although I do  believe some sharp debridement would be of benefit here as well. ?12/28/2021 upon evaluation today patient appears to be doing well with regard to his wounds. Everything is showing signs of excellent ?improvement which I am very pleased about. I think that we are headed in the right direction here. Fortunately there does not appear to be any ?evidence of  infection which is great news there is a little bit of hypergranulation. ?01/04/2022 upon evaluation today patient appears to be doing well with regard to his wounds 2 of them are healed 1 is almost so and the o

## 2022-01-04 NOTE — Progress Notes (Signed)
DEMORION, ONDO ([id number]) ?Visit Report for 01/04/2022 ?Arrival Information Details ?Patient Name: [name], [name] ?Date of Service: 01/04/2022 1:45 PM ?Medical Record Number: [id number] ?Patient Account Number: [id number] ?Date of Birth/Sex: 03/28/1946 (76 y.o. M) ?Treating RN: Hansel Feinstein ?Primary Care [name]: [name] Other Clinician: ?Referring [name]: [name] ?Treating Alba Kriesel/Extender: Allen Derry ?Weeks in Treatment: 4 ?Visit Information History Since Last Visit ?Added or deleted any medications: No ?Patient Arrived: Ambulatory ?Had a fall or experienced change in No ?Arrival Time: 13:47 ?activities of daily living that may affect ?Accompanied By: self ?risk of falls: ?Transfer Assistance: None ?Hospitalized since last visit: No ?Patient Identification Verified: Yes ?Has Dressing in Place as Prescribed: Yes ?Secondary Verification Process Completed: Yes ?Has Compression in Place as Prescribed: Yes ?Patient Requires Transmission-Based No ?Pain Present Now: No ?Precautions: ?Patient Has Alerts: Yes ?Patient Alerts: Patient on Blood ?Thinner ?NOT diabetic ?Xarelto ?AVVS ABI 12/13/21 [place] ?TBI L 0.72; R 0.75 ?Electronic Signature(s) ?Signed: 01/04/2022 2:59:43 PM By: Hansel Feinstein ?Entered ByHansel Feinstein on 01/04/2022 13:48:59 ?[name], [name] ([id number]) ?-------------------------------------------------------------------------------- ?Compression Therapy Details ?Patient Name: [name], [name] ?Date of Service: 01/04/2022 1:45 PM ?Medical Record Number: [id number] ?Patient Account Number: [id number] ?Date of Birth/Sex: Sep 22, 1946 (76 y.o. M) ?Treating RN: Hansel Feinstein ?Primary Care [name]: [name] Other Clinician: ?Referring [name]: [name] ?Treating Jameon [name]/Extender: Allen Derry ?Weeks in Treatment: 4 ?Compression Therapy Performed for Wound Assessment: Wound #6 Left,Proximal,Medial Lower Leg ?Performed By: Clinician Hansel Feinstein, RN ?Compression Type: Three Layer ?Post Procedure  Diagnosis ?Same as Pre-procedure ?Electronic Signature(s) ?Signed: 01/04/2022 2:59:43 PM By: Hansel Feinstein ?Entered ByHansel Feinstein on 01/04/2022 14:07:38 ?[name], [name] ([id number]) ?-------------------------------------------------------------------------------- ?Encounter Discharge Information Details ?Patient Name: [name], [name] ?Date of Service: 01/04/2022 1:45 PM ?Medical Record Number: [id number] ?Patient Account Number: [id number] ?Date of Birth/Sex: [birth year] (76 y.o. M) ?Treating RN: Hansel Feinstein ?Primary Care [name]: [name] Other Clinician: ?Referring [name]: [name] ?Treating Ruven Corradi/Extender: Allen Derry ?Weeks in Treatment: 4 ?Encounter Discharge Information Items ?Discharge Condition: Stable ?Ambulatory Status: Ambulatory ?Discharge Destination: Home ?Transportation: Private Auto ?Accompanied By: self ?Schedule Follow-up Appointment: Yes ?Clinical Summary of Care: ?Electronic Signature(s) ?Signed: 01/04/2022 2:59:43 PM By: Hansel Feinstein ?Entered ByHansel Feinstein on 01/04/2022 14:41:05 ?[name], [name] ([id number]) ?-------------------------------------------------------------------------------- ?Lower Extremity Assessment Details ?Patient Name: [name], [name] ?Date of Service: 01/04/2022 1:45 PM ?Medical Record Number: [id number] ?Patient Account Number: [id number] ?Date of Birth/Sex: 1946-03-01 (76 y.o. M) ?Treating RN: Hansel Feinstein ?Primary Care Aerie Donica: [name] Other Clinician: ?Referring [name]: [name] ?Treating Shailey Butterbaugh/Extender: Allen Derry ?Weeks in Treatment: 4 ?Edema Assessment ?Assessed: [Left: Yes] [Right: Yes] ?[Left: Edema] [Right: :] ?Calf ?Left: Right: ?Point of Measurement: From Medial Instep 39 cm 34.5 cm ?Ankle ?Left: Right: ?Point of Measurement: From Medial Instep 23.5 cm 24 cm ?Electronic Signature(s) ?Signed: 01/04/2022 2:59:43 PM By: Hansel Feinstein ?Entered ByHansel Feinstein on 01/04/2022 14:05:45 ?[name], [name]  ([id number]) ?-------------------------------------------------------------------------------- ?Multi Wound Chart Details ?Patient Name: [name], [name] ?Date of Service: 01/04/2022 1:45 PM ?Medical Record Number: [id number] ?Patient Account Number: [id number] ?Date of Birth/Sex: Apr 21, 1946 (76 y.o. M) ?Treating RN: Hansel Feinstein ?Primary Care [name]: [name] Other Clinician: ?Referring [name]: [name] ?Treating Lin Glazier/Extender: Allen Derry ?Weeks in Treatment: 4 ?Vital Signs ?Height(in): 75 ?Pulse(bpm): 75 ?Weight(lbs): 245 ?Blood Pressure(mmHg): 164/75 ?Body Mass Index(BMI): 30.6 ?Temperature(??F): 98.2 ?Respiratory Rate(breaths/min): 16 ?Photos: ?Wound Location: Right, Medial Lower Leg Left, Distal, Medial Lower Leg Left, Proximal, Medial Lower Leg ?Wounding Event: Gradually Appeared Gradually Appeared Gradually Appeared ?Primary Etiology: Venous Leg Ulcer Venous  Leg Ulcer Venous Leg Ulcer ?Comorbid History: Arrhythmia, Hypertension, Type II Arrhythmia, Hypertension, Type II Arrhythmia, Hypertension, Type II ?Diabetes, Gout Diabetes, Gout Diabetes, Gout ?Date Acquired: 11/09/2021 11/09/2021 11/09/2021 ?Weeks of Treatment: 4 4 4  ?Wound Status: Healed - Epithelialized Healed - Epithelialized Open ?Wound Recurrence: No No No ?Measurements L x W x D (cm) 0x0x0 0x0x0 1x1x0.1 ?Area (cm?) : 0 0 0.785 ?Volume (cm?) : 0 0 0.079 ?% Reduction in Area: 100.00% 100.00% 78.30% ?% Reduction in Volume: 100.00% 100.00% 78.10% ?Classification: Full Thickness Without Exposed Full Thickness Without Exposed Full Thickness Without Exposed ?Support Structures Support Structures Support Structures ?Exudate Amount: None Present None Present Medium ?Exudate Type: [name] [name] Serosanguineous ?Exudate Color: [name] [name] red, brown ?Granulation Amount: None Present (0%) None Present (0%) Large (67-100%) ?Granulation Quality: [name] [name] Red, Hyper-granulation ?Necrotic Amount: None Present (0%) None Present (0%) Small (1-33%) ?Exposed  Structures: ?Fascia: No ?Fascia: No Fat Layer (Subcutaneous Tissue): ?Fat Layer (Subcutaneous Tissue): ?Fat Layer (Subcutaneous Tissue): Yes ?No No Fascia: No ?Tendon: No ?Tendon: No ?Tendon: No ?Muscle: No ?Muscle: No ?Muscle: No ?Joint: No ?Joint: No ?Joint: No ?Bone: No ?Bone: No ?Bone: No ?Epithelialization: None None None ?Wound Number: 7 [name] [name] ?Photos: ?[name] [name] ?[name], [name] ([id number]) ?Wound Location: Right, Proximal Lower Leg [name] [name] ?Wounding Event: Gradually Appeared [name] [name] ?Primary Etiology: Venous Leg Ulcer [name] [name] ?Comorbid History: Arrhythmia, Hypertension, Type II [name] [name] ?Diabetes, Gout ?Date Acquired: 12/14/2021 [name] [name] ?Weeks of Treatment: 3 [name] [name] ?Wound Status: Open [name] [name] ?Wound Recurrence: No [name] [name] ?Measurements L x W x D (cm) 0.1x0.1x0.1 [name] [name] ?Area (cm?) : 0.008 [name] [name] ?Volume (cm?) : 0.001 [name] [name] ?% Reduction in Area: 95.90% [name] [name] ?% Reduction in Volume: 95.00% [name] [name] ?Classification: Full Thickness Without Exposed [name] [name] ?Support Structures ?Exudate Amount: None Present [name] [name] ?Exudate Type: [name] [name] [name] ?Exudate Color: [name] [name] [name] ?Granulation Amount: Large (67-100%) [name] [name] ?Granulation Quality: Pink [name] [name] ?Necrotic Amount: None Present (0%) [name] [name] ?Exposed Structures: ?Fascia: No [name] [name] ?Fat Layer (Subcutaneous Tissue): ?No ?Tendon: No ?Muscle: No ?Joint: No ?Bone: No ?Epithelialization: None [name] [name] ?Treatment Notes ?Electronic Signature(s) ?Signed: 01/04/2022 2:59:43 PM By: Hansel Feinstein ?Entered ByHansel Feinstein on 01/04/2022 14:06:39 ?[name], [name] R. ([id number]) ?-------------------------------------------------------------------------------- ?Multi-Disciplinary Care Plan Details ?Patient Name: [name], [name] ?Date of Service: 01/04/2022 1:45 PM ?Medical Record Number: [id number] ?Patient Account Number: [id number] ?Date of Birth/Sex: [birth year] (76 y.o. M) ?Treating RN: Hansel Feinstein ?Primary Care Eean Buss: [name] Other Clinician: ?Referring Maudine Kluesner:  [name] ?Treating Abryana Lykens/Extender: Allen Derry ?Weeks in Treatment: 4 ?Active Inactive ?Electronic Signature(s) ?Signed: 01/04/2022 2:59:43 PM By: Hansel Feinstein ?Entered ByHansel Feinstein on 01/04/2022 14:05:59 ?[name], [name] R. ([id number]) ?----

## 2022-01-16 ENCOUNTER — Encounter: Payer: Medicare PPO | Attending: Physician Assistant | Admitting: Physician Assistant

## 2022-01-16 DIAGNOSIS — E1151 Type 2 diabetes mellitus with diabetic peripheral angiopathy without gangrene: Secondary | ICD-10-CM | POA: Insufficient documentation

## 2022-01-16 DIAGNOSIS — L97822 Non-pressure chronic ulcer of other part of left lower leg with fat layer exposed: Secondary | ICD-10-CM | POA: Insufficient documentation

## 2022-01-16 DIAGNOSIS — I482 Chronic atrial fibrillation, unspecified: Secondary | ICD-10-CM | POA: Diagnosis not present

## 2022-01-16 DIAGNOSIS — Z7901 Long term (current) use of anticoagulants: Secondary | ICD-10-CM | POA: Diagnosis not present

## 2022-01-16 DIAGNOSIS — M109 Gout, unspecified: Secondary | ICD-10-CM | POA: Insufficient documentation

## 2022-01-16 DIAGNOSIS — I872 Venous insufficiency (chronic) (peripheral): Secondary | ICD-10-CM | POA: Diagnosis not present

## 2022-01-16 DIAGNOSIS — I10 Essential (primary) hypertension: Secondary | ICD-10-CM | POA: Diagnosis not present

## 2022-01-16 DIAGNOSIS — Z85828 Personal history of other malignant neoplasm of skin: Secondary | ICD-10-CM | POA: Insufficient documentation

## 2022-01-16 DIAGNOSIS — L97812 Non-pressure chronic ulcer of other part of right lower leg with fat layer exposed: Secondary | ICD-10-CM | POA: Diagnosis not present

## 2022-01-16 NOTE — Progress Notes (Addendum)
EHAB, HUMBER ([id number]) ?Visit Report for 01/16/2022 ?Chief Complaint Document Details ?Patient Name: [name], [name] ?Date of Service: 01/16/2022 2:00 PM ?Medical Record Number: [id number] ?Patient Account Number: [id number] ?Date of Birth/Sex: May 09, 1946 (76 y.o. M) ?Treating RN: Carlene Coria ?Primary Care Provider: [name] Other Clinician: ?Referring Provider: [name] ?Treating Provider/Extender: Jeri Cos ?Weeks in Treatment: 5 ?Information Obtained from: Patient ?Chief Complaint ?Bilateral LE Ulcer ?Electronic Signature(s) ?Signed: 01/16/2022 2:21:23 PM By: [name] PA-C ?Entered By: [name] on 01/16/2022 14:21:22 ?ANDDY, WINGERT R. ([id number]) ?-------------------------------------------------------------------------------- ?Debridement Details ?Patient Name: [name], [name] ?Date of Service: 01/16/2022 2:00 PM ?Medical Record Number: [id number] ?Patient Account Number: [id number] ?Date of Birth/Sex: 12-Sep-1946 (76 y.o. M) ?Treating RN: Carlene Coria ?Primary Care Provider: [name] Other Clinician: ?Referring Provider: [name] ?Treating Provider/Extender: Jeri Cos ?Weeks in Treatment: 5 ?Debridement Performed for ?Wound #6 Left,Proximal,Medial Lower Leg ?Assessment: ?Performed By: Physician Tommie Sams., PA-C ?Debridement Type: Debridement ?Severity of Tissue Pre Debridement: Fat layer exposed ?Level of Consciousness (Pre- ?Awake and Alert ?procedure): ?Pre-procedure Verification/Time Out ?Yes - 14:42 ?Taken: ?Start Time: 14:42 ?Total Area Debrided (L x W): 0.5 (cm) x 0.5 (cm) = 0.25 (cm?) ?Tissue and other material ?Viable, Non-Viable ?debrided: ?Level: Non-Viable Tissue ?Debridement Description: Selective/Open Wound ?Instrument: Curette ?Bleeding: Minimum ?Hemostasis Achieved: Pressure ?End Time: 14:45 ?Procedural Pain: 0 ?Post Procedural Pain: 0 ?Response to Treatment: Procedure was tolerated well ?Level of Consciousness (Post- ?Awake and  Alert ?procedure): ?Post Debridement Measurements of Total Wound ?Length: (cm) 0.1 ?Width: (cm) 0.1 ?Depth: (cm) 0.1 ?Volume: (cm?) 0.001 ?Character of Wound/Ulcer Post Debridement: Improved ?Severity of Tissue Post Debridement: Fat layer exposed ?Post Procedure Diagnosis ?Same as Pre-procedure ?Electronic Signature(s) ?Signed: 01/16/2022 5:16:21 PM By: [name] PA-C ?Signed: 01/23/2022 9:16:22 AM By: Carlene Coria RN ?Entered By: Carlene Coria on 01/16/2022 14:42:55 ?[name], [name] ([id number]) ?-------------------------------------------------------------------------------- ?HPI Details ?Patient Name: [name], [name] ?Date of Service: 01/16/2022 2:00 PM ?Medical Record Number: [id number] ?Patient Account Number: [id number] ?Date of Birth/Sex: [birth year] (76 y.o. 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 ?the patient has had a vascular workup over 7 years ago and said everything was normal at that stage. He does not have any chronic problems ?except for cardiac issues which he sees a cardiologist in [place]. ?08/15/2017 -- arterial and venous duplex studies still pending. ?08/23/2017 -- venous reflux studies done on 08/13/2017 shows venous incompetence throughout the left lower extremity deep system and ?focally at the left saphenofemoral junction. 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 No evidence of SVT or DVT  in bilateral ?lower extremities ?The patient has an appointment at the end of the month to get his arterial duplex study done ?09/05/2017 -- the patient was seen at the vein and vascular office yesterday by Angelena Form. ABI studies were notable for medial calcification ?and the toe brachial indices were normal and bilateral ankle-brachial) waveforms were normal with triphasic flow. After review of his venous ?studies he was not a candidate for laser ablation and his lymphedema was to be treated with compression stockings and lymphedema  pump ?pumps ?09/12/2017 -- had a low arterial study done at the [facility] -- unable to obtain reliable ABI is due to medial ?calcification. Bilateral toe indices were normal with the right being 1.01 and the left being 0.92 and the waveforms were triphasic bilaterally. ?he did get hold of 30-40 mm compression stockings but is unable to put these on. We will try and get him alternative compression stockings. ?09/26/17- he is here in follow up evaluation of a right lower extremity ulcer;he is compliant in wearing compression stocking; ulcer almost ?epithelialized , anticipate healing next appointment ?Readmission: ?11/17 point upon evaluation patient's wound currently that he is seeing [place] for today is a skin cancerous lesion that was cleared away by his ?dermatologist on the left medial calf region. He tells me that this is a very similar thing to what he had done previously in fact the last time he ?saw him in 2018 this was also what was going on at that point. Nonetheless he feels that based on what he seeing currently that this is just ?having a lot of harder time healing although it is much closer to the surface than what he is experienced in the past. He notes that the initial ?removal was in June 2022 which was this year this is now November and still has not closed. He does have some edema and definitely I think that ?there is some venous component to  his slow healing here. Also think that we can do something better than Vaseline to try to help with getting this ?to clear up as quickly as possible. He does have a history of atrial fibrillation and is on Eliquis otherwise he really has no major medical problems ?that would affect wound healing. ?09/07/2021 upon evaluation today patient actually appears to be doing significantly better after having wrapped him last week. Overall I think that ?this is making significant improvements at this time which is great news. I do not see any evidence of infection which is great news as well. No ?fevers, chills, nausea, vomiting, or diarrhea. ?09/14/2021 upon evaluation today patient appears to be doing well with regard to his leg ulcer. He has been tolerating the dressing changes and ?overall I think that he is making excellent progress. I do not see any signs of active infection at this time. ?09/21/2021 upon evaluation today patient actually appears to be making good progress with regard to his wound this is again measuring smaller ?today no debridement seems to be necessary. We have been using a silver collagen dressing and I think that is doing an awesome job. ?09/28/2021 upon evaluation today patient appears to be doing well with regard to his leg currently. I do not see any signs of active infection at ?this time which is great news. No fevers, chills, nausea, vomiting, or diarrhea. I think this wound is very close to complete resolution. ?10/12/2021 upon evaluation today patient actually appears to be doing awesome in regard to his leg ulcer. In fact this appears to be completely ?healed based on what I am seeing currently. I do not see any evidence of active infection locally nor systemically at this time which is also great ?news. No fevers, chills, nausea, vomiting, or diarrhea. ?Readmission: ?12/07/2021 upon evaluation today patient presents for readmission here in the clinic. He was discharged on 10/12/2021 is completely  healed. ?Unfortunately this has reopened at this point and he is having continual issues with new blisters over both lower extremities. This is even  worse ?than what we previously saw. Nonetheless we did actually check hi

## 2022-01-23 ENCOUNTER — Encounter: Payer: Medicare PPO | Admitting: Physician Assistant

## 2022-01-23 DIAGNOSIS — L97822 Non-pressure chronic ulcer of other part of left lower leg with fat layer exposed: Secondary | ICD-10-CM | POA: Diagnosis not present

## 2022-01-23 DIAGNOSIS — I10 Essential (primary) hypertension: Secondary | ICD-10-CM | POA: Diagnosis not present

## 2022-01-23 DIAGNOSIS — I872 Venous insufficiency (chronic) (peripheral): Secondary | ICD-10-CM | POA: Diagnosis not present

## 2022-01-23 DIAGNOSIS — L97812 Non-pressure chronic ulcer of other part of right lower leg with fat layer exposed: Secondary | ICD-10-CM | POA: Diagnosis not present

## 2022-01-23 DIAGNOSIS — E1151 Type 2 diabetes mellitus with diabetic peripheral angiopathy without gangrene: Secondary | ICD-10-CM | POA: Diagnosis not present

## 2022-01-23 DIAGNOSIS — Z7901 Long term (current) use of anticoagulants: Secondary | ICD-10-CM | POA: Diagnosis not present

## 2022-01-23 DIAGNOSIS — M109 Gout, unspecified: Secondary | ICD-10-CM | POA: Diagnosis not present

## 2022-01-23 DIAGNOSIS — I482 Chronic atrial fibrillation, unspecified: Secondary | ICD-10-CM | POA: Diagnosis not present

## 2022-01-23 DIAGNOSIS — Z85828 Personal history of other malignant neoplasm of skin: Secondary | ICD-10-CM | POA: Diagnosis not present

## 2022-01-23 NOTE — Progress Notes (Addendum)
KINGSTEN, ENFIELD ([id number]) ?Visit Report for 01/23/2022 ?Chief Complaint Document Details ?Patient Name: [name], [name] ?Date of Service: 01/23/2022 8:45 AM ?Medical Record Number: [id number] ?Patient Account Number: [id number] ?Date of Birth/Sex: [birth year] (76 y.o. M) ?Treating RN: Cornell Barman ?Primary Care Provider: [name] Other Clinician: ?Referring Provider: [name] ?Treating Provider/Extender: Jeri Cos ?Weeks in Treatment: 6 ?Information Obtained from: Patient ?Chief Complaint ?Bilateral LE Ulcer ?Electronic Signature(s) ?Signed: 01/23/2022 8:42:16 AM By: [name] PA-C ?Entered By: [name] on 01/23/2022 08:42:16 ?[name], [name] ([id number]) ?-------------------------------------------------------------------------------- ?HPI Details ?Patient Name: [name], [name] ?Date of Service: 01/23/2022 8:45 AM ?Medical Record Number: [id number] ?Patient Account Number: [id number] ?Date of Birth/Sex: [birth year] (76 y.o. M) ?Treating RN: Cornell Barman ?Primary Care Provider: [name] Other Clinician: ?Referring Provider: [name] ?Treating Provider/Extender: Jeri Cos ?Weeks in Treatment: 6 ?History of Present Illness ?HPI Description: 76 year old male who has a past medical history of essential hypertension, chronic atrial fibrillation, peripheral vascular ?disease, nonischemic cardiomyopathy,venous stasis dermatitis, gouty arthropathy, basal cell carcinoma of the right lower extremity, benign ?prostatic hypertrophy, long-term use of anticoagulation therapy, hyperglycemia and exercise intolerance has never been a smoker. ?the patient has had a vascular workup over 7 years ago and said everything was normal at that stage. He does not have any chronic problems ?except for cardiac issues which he sees a cardiologist in [place]. ?08/15/2017 -- arterial and venous duplex studies still pending. ?08/23/2017 -- venous reflux studies done on 08/13/2017 shows venous incompetence  throughout the left lower extremity deep system and ?focally at the left saphenofemoral junction. 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 No evidence of SVT or DVT in bilateral ?lower extremities ?The patient has an appointment at the end of the month to get his arterial duplex study done ?09/05/2017 -- the patient was seen at the vein and vascular office yesterday by Angelena Form. ABI studies were notable for medial calcification ?and the toe brachial indices were normal and bilateral ankle-brachial) waveforms were normal with triphasic flow. After review of his venous ?studies he was not a candidate for laser ablation and his lymphedema was to be treated with compression stockings and lymphedema pump ?pumps ?09/12/2017 -- had a low arterial study done at the [facility] -- unable to obtain reliable ABI is due to medial ?calcification. Bilateral toe indices were normal with the right being 1.01 and the left being 0.92 and the waveforms were triphasic bilaterally. ?he did get hold of 30-40 mm compression stockings but is unable to put these on. We will try and get him alternative compression stockings. ?09/26/17- he is here in follow up evaluation of a right lower extremity ulcer;he is compliant in wearing compression stocking; ulcer almost ?epithelialized , anticipate healing next appointment ?Readmission: ?11/17 point upon evaluation patient's wound currently that he is seeing [place] for today is a skin cancerous lesion that was cleared away by his ?dermatologist on the left medial calf region. He tells me that this is a very similar thing to what he had done previously in fact the last time he ?saw him in 2018 this was also what was going on at that point. Nonetheless he feels that based on what he seeing currently that this is just ?having a lot of harder time healing although it is much closer to the surface than what he is experienced in the past. He notes that the  initial ?removal was in June 2022 which was this year this is now  November and still has not closed. He does have some edema and definitely I think that ?there is some venous component to his slow healing here. Also think that we can do something better than Vaseline to try to help with getting this ?to clear up as quickly as possible. He does have a history of atrial fibrillation and is on Eliquis otherwise he really has no major medical problems ?that would affect wound healing. ?09/07/2021 upon evaluation today patient actually appears to be doing significantly better after having wrapped him last week. Overall I think that ?this is making significant improvements at this time which is great news. I do not see any evidence of infection which is great news as well. No ?fevers, chills, nausea, vomiting, or diarrhea. ?09/14/2021 upon evaluation today patient appears to be doing well with regard to his leg ulcer. He has been tolerating the dressing changes and ?overall I think that he is making excellent progress. I do not see any signs of active infection at this time. ?09/21/2021 upon evaluation today patient actually appears to be making good progress with regard to his wound this is again measuring smaller ?today no debridement seems to be necessary. We have been using a silver collagen dressing and I think that is doing an awesome job. ?09/28/2021 upon evaluation today patient appears to be doing well with regard to his leg currently. I do not see any signs of active infection at ?this time which is great news. No fevers, chills, nausea, vomiting, or diarrhea. I think this wound is very close to complete resolution. ?10/12/2021 upon evaluation today patient actually appears to be doing awesome in regard to his leg ulcer. In fact this appears to be completely ?healed based on what I am seeing currently. I do not see any evidence of active infection locally nor systemically at this time which is also great ?news. No  fevers, chills, nausea, vomiting, or diarrhea. ?Readmission: ?12/07/2021 upon evaluation today patient presents for readmission here in the clinic. He was discharged on 10/12/2021 is completely healed. ?Unfortunately this has reopened at this point and he is having continual issues with new blisters over both lower extremities. This is even worse ?than what we previously saw. Nonetheless we did actually check his ABIs today and it did reveal that his ABIs were 0.55 on the left and 0.57 on ?the right. Subsequently this is a definite change from his last arterial study which showed that he did have good blood flow at 1.01 on the right ?and 0.92 on the left and that was right at the beginning of 2019. Nonetheless based on what we see currently I do think he tolerated the 3 layer ?compression wrap but I do believe that we probably need to get him tested for his arterial flow in order to see where things stand and if there is ?something we can do there that would help prevent this from continue to be an ongoing issue. He did not utilize compression socks in the interim ?from when he was last here till this time. That something is probably going to need lifelong going forward as well. ?3/9; patient presents for follow-up. He has no issues or complaints today. He tolerated the compression wrap well. He had ABIs with TBI's done. [name], [name] ([id number]) ?He denies signs of infection. ?12/21/2021 upon evaluation today patient appears to be doing well with regard to the wounds on his legs. Both are showing signs of significant ?improvement which is great news although I do  believe some sharp debridement would be of benefit here as well. ?12/28/2021 upon evaluation today patient appears to be doing well with regard to his wounds. Everything is showing signs of excellent ?improvement which I am very pleased about. I think that we are headed in the right direction here. Fortunately there does not appear to be any ?evidence of  infection which is great news there is a little bit of hypergranulation. ?01/04/2022 upon evaluation today patient appears to be doing well with regard to his wounds 2 of them are healed 1 is almost so and the oth

## 2022-01-23 NOTE — Progress Notes (Addendum)
KOHAN, DENBO ([id number]) ?Visit Report for 01/23/2022 ?Arrival Information Details ?Patient Name: [name], [name] ?Date of Service: 01/23/2022 8:45 AM ?Medical Record Number: [id number] ?Patient Account Number: [id number] ?Date of Birth/Sex: 07/30/1946 (76 y.o. M) ?Treating RN: Huel Coventry ?Primary Care Cannie Muckle: [name] Other Clinician: ?Referring Alanmichael Barmore: [name] ?Treating Verlaine Embry/Extender: Allen Derry ?Weeks in Treatment: 6 ?Visit Information History Since Last Visit ?Added or deleted any medications: No ?Patient Arrived: Ambulatory ?Has Dressing in Place as Prescribed: Yes ?Arrival Time: 08:47 ?Has Compression in Place as Prescribed: Yes ?Transfer Assistance: None ?Pain Present Now: No ?Patient Identification Verified: Yes ?Secondary Verification Process Completed: Yes ?Patient Requires Transmission-Based No ?Precautions: ?Patient Has Alerts: Yes ?Patient Alerts: Patient on Blood ?Thinner ?NOT diabetic ?Xarelto ?AVVS ABI 12/13/21 [place] ?TBI L 0.72; R 0.75 ?Electronic Signature(s) ?Signed: 01/23/2022 3:20:38 PM By: Elliot Gurney, BSN, RN, CWS, Kim RN, BSN ?Entered By: Elliot Gurney, BSN, RN, CWS, Kim on 01/23/2022 08:49:42 ?JOCSAN, RAUP R. ([id number]) ?-------------------------------------------------------------------------------- ?Compression Therapy Details ?Patient Name: [name], [name] ?Date of Service: 01/23/2022 8:45 AM ?Medical Record Number: [id number] ?Patient Account Number: [id number] ?Date of Birth/Sex: 02-17-1946 (76 y.o. M) ?Treating RN: Huel Coventry ?Primary Care Syniah Berne: [name] Other Clinician: ?Referring Afton Mikelson: [name] ?Treating Tava Peery/Extender: Allen Derry ?Weeks in Treatment: 6 ?Compression Therapy Performed for Wound Assessment: Non-Wound Location ?Performed By: Clinician Huel Coventry, RN ?Compression Type: Three Layer ?Location: Lower Extremity, Left ?Post Procedure Diagnosis ?Same as Pre-procedure ?Notes ?12/13/2021 TBI 0.72 ?Electronic Signature(s) ?Signed: 01/23/2022 9:50:52 AM  By: Elliot Gurney, BSN, RN, CWS, Kim RN, BSN ?Entered By: Elliot Gurney, BSN, RN, CWS, Kim on 01/23/2022 09:50:52 ?MATIN, HOLK R. ([id number]) ?-------------------------------------------------------------------------------- ?Encounter Discharge Information Details ?Patient Name: [name], [name] ?Date of Service: 01/23/2022 8:45 AM ?Medical Record Number: [id number] ?Patient Account Number: [id number] ?Date of Birth/Sex: 1946/03/20 (76 y.o. M) ?Treating RN: Huel Coventry ?Primary Care Demian Maisel: [name] Other Clinician: ?Referring Kadelyn Dimascio: [name] ?Treating Cola Gane/Extender: Allen Derry ?Weeks in Treatment: 6 ?Encounter Discharge Information Items ?Discharge Condition: Stable ?Ambulatory Status: Ambulatory ?Discharge Destination: Home ?Transportation: Private Auto ?Accompanied By: self ?Schedule Follow-up Appointment: Yes ?Clinical Summary of Care: ?Electronic Signature(s) ?Signed: 01/23/2022 10:26:29 AM By: Elliot Gurney, BSN, RN, CWS, Kim RN, BSN ?Entered By: Elliot Gurney, BSN, RN, CWS, Kim on 01/23/2022 75:64:33 ?[name], [name] R. ([id number]) ?-------------------------------------------------------------------------------- ?Lower Extremity Assessment Details ?Patient Name: [name], [name] ?Date of Service: 01/23/2022 8:45 AM ?Medical Record Number: [id number] ?Patient Account Number: [id number] ?Date of Birth/Sex: [birth year] (76 y.o. M) ?Treating RN: Huel Coventry ?Primary Care Crawford Tamura: [name] Other Clinician: ?Referring Abdiel Blackerby: [name] ?Treating Jamie Belger/Extender: Allen Derry ?Weeks in Treatment: 6 ?Edema Assessment ?Assessed: [Left: No] [Right: No] ?[Left: Edema] [Right: :] ?Calf ?Left: Right: ?Point of Measurement: 35 cm From Medial Instep 38 cm ?Ankle ?Left: Right: ?Point of Measurement: 13 cm From Medial Instep 23.8 cm ?Knee To Floor ?Left: Right: ?From Medial Instep 50 cm ?Vascular Assessment ?Pulses: ?Dorsalis Pedis ?Palpable: [Left:Yes] [Right:Yes] ?Electronic Signature(s) ?Signed: 01/23/2022 3:20:38 PM By:  Elliot Gurney, BSN, RN, CWS, Kim RN, BSN ?Entered By: Elliot Gurney, BSN, RN, CWS, Kim on 01/23/2022 09:01:59 ?[name], [name] R. ([id number]) ?-------------------------------------------------------------------------------- ?Multi Wound Chart Details ?Patient Name: [name], [name] ?Date of Service: 01/23/2022 8:45 AM ?Medical Record Number: [id number] ?Patient Account Number: [id number] ?Date of Birth/Sex: [birth year] (76 y.o. M) ?Treating RN: Huel Coventry ?Primary Care Armany Mano: [name] Other Clinician: ?Referring Frayda Egley: [name] ?Treating Josemaria Brining/Extender: Allen Derry ?Weeks in Treatment: 6 ?Vital Signs ?Height(in): 75 ?Pulse(bpm): 72 ?Weight(lbs): 245 ?Blood Pressure(mmHg): 200/80 ?Body Mass Index(BMI): 30.6 ?Temperature(??F): 97.8 ?Respiratory Rate(breaths/min): 18 ?Photos: [6:No  Photos] [7R:No Photos] [8:No Photos] ?Wound Location: [6:Left, Proximal, Medial Lower Leg] [7R:Right, Proximal Lower Leg] [8:Right, Medial, Anterior Lower Leg] ?Wounding Event: [6:Gradually Appeared] [7R:Gradually Appeared] [8:Gradually Appeared] ?Primary Etiology: [6:Venous Leg Ulcer] [7R:Venous Leg Ulcer] [8:Venous Leg Ulcer] ?Date Acquired: [6:11/09/2021] [7R:12/14/2021] [8:01/22/2022] ?Weeks of Treatment: [6:6] [7R:5] [8:0] ?Wound Status: [6:Healed - Epithelialized] [7R:Open] [8:Open] ?Wound Recurrence: [6:No] [7R:Yes] [8:No] ?Measurements L x W x D (cm) [6:0x0x0] [7R:0.6x0.6x0.1] [8:0.6x0.5x0.1] ?Area (cm?) : [6:0] [7R:0.283] [8:0.236] ?Volume (cm?) : [6:0] [7R:0.028] [8:0.024] ?% Reduction in Area: [6:100.00%] [7R:-44.40%] [8:[name]] ?% Reduction in Volume: [6:100.00%] [7R:-40.00%] [8:[name]] ?Classification: [6:Full Thickness Without Exposed Support Structures] [7R:Full Thickness Without Exposed Support Structures] [8:[name]] ?Exudate Amount: [6:Medium] [7R:Medium] [8:[name]] ?Exudate Type: [6:Serosanguineous red, brown] [7R:Serosanguineous red, brown] [8:[name] [name]] ?Treatment Notes ?Electronic Signature(s) ?Signed: 01/23/2022 3:20:38 PM By: Elliot Gurney, BSN,  RN, CWS, Kim RN, BSN ?Entered By: Elliot Gurney, BSN, RN, CWS, Kim on 01/23/2022 09:27:17 ?[name], [name] R. ([id number]) ?-------------------------------------------------------------------------------- ?Multi-Disciplinary Care Plan Details ?Patient Name: [name], [name] ?Date of Service: 01/23/2022 8:45 AM ?Medical Record Number: [id number] ?Patient Account Number: [id number] ?Date of Birth/Sex: [birth year] (76 y.o. M) ?Treating RN: Huel Coventry ?Primary Care Rochester Serpe: [name] Other Clinician: ?Referring Chantry Headen: [name] ?Treating Bernetta Sutley/Extender: Allen Derry ?Weeks in Treatment: 6 ?Active Inactive ?Wound/Skin Impairment ?Nursing Diagnoses: ?Impaired tissue integrity ?Knowledge deficit related to smoking impact on wound healing ?Knowledge deficit related to ulceration/compromised skin integrity ?Goals: ?Patient/caregiver will verbalize understanding of skin care regimen ?Date Initiated: 01/23/2022 ?Target Resolution Date: 01/23/2022 ?Goal Status: Active ?Ulcer/skin breakdown will have a volume reduction of 30% by week 4 ?Date Initiated: 01/23/2022 ?Target Resolution Date: 02/20/2022 ?Goal Status: Active ?Interventions: ?Assess patient/caregiver ability to obtain necessary supplies ?Assess patient/caregiver ability to perform ulcer/skin care regimen upon admission and as needed ?Assess ulceration(s) every visit ?Notes: ?Electronic Signature(s) ?Signed: 01/23/2022 3:20:38 PM By: Elliot Gurney, BSN, RN, CWS, Kim RN, BSN ?Entered By: Elliot Gurney, BSN, RN, CWS, Kim on 01/23/2022 09:27:04 ?[name], [name] R. ([id number]) ?-------------------------------------------------------------------------------- ?Pain Assessment Details ?Patient Name: [name], [name] ?Date of Service: 01/23/2022 8:45 AM ?Medical Record Number: [id number] ?Patient Account Number: [id number] ?Date of Birth/Sex: 1946-03-26 (76 y.o. M) ?Treating RN: Huel Coventry ?Primary Care Chelesa Weingartner: [name] Other Clinician: ?Referring Jaunice Mirza: [name] ?Treating  Tosca Pletz/Extender: Allen Derry ?Weeks in Treatment: 6 ?Active Problems ?Location of Pain Severity and Description of Pain ?Patient Has Paino No ?Site Locations ?Pain Management and Medication ?Current Pain Management

## 2022-01-23 NOTE — Progress Notes (Signed)
AMANDO, MASSER ([id number]) ?Visit Report for 01/16/2022 ?Arrival Information Details ?Patient Name: [name], [name] ?Date of Service: 01/16/2022 2:00 PM ?Medical Record Number: [id number] ?Patient Account Number: [id number] ?Date of Birth/Sex: Feb 24, 1946 (76 y.o. M) ?Treating RN: Yevonne Pax ?Primary Care Ervine Witucki: [name] Other Clinician: ?Referring Kenwood Rosiak: [name] ?Treating Alasia Enge/Extender: Allen Derry ?Weeks in Treatment: 5 ?Visit Information History Since Last Visit ?All ordered tests and consults were completed: No ?Patient Arrived: Ambulatory ?Added or deleted any medications: No ?Arrival Time: 14:06 ?Any new allergies or adverse reactions: No ?Accompanied By: self ?Had a fall or experienced change in No ?Transfer Assistance: None ?activities of daily living that may affect ?Patient Identification Verified: Yes ?risk of falls: ?Secondary Verification Process Completed: Yes ?Signs or symptoms of abuse/neglect since last visito No ?Patient Requires Transmission-Based No ?Hospitalized since last visit: No ?Precautions: ?Implantable device outside of the clinic excluding No ?Patient Has Alerts: Yes ?cellular tissue based products placed in the center ?Patient Alerts: Patient on Blood ?since last visit: ?Thinner ?Has Dressing in Place as Prescribed: Yes ?NOT diabetic ?Has Compression in Place as Prescribed: Yes ?Xarelto ?AVVS ABI 12/13/21 [place] Pain Present Now: No ?TBI L 0.72; R 0.75 ?Electronic Signature(s) ?Signed: 01/23/2022 9:16:22 AM By: Yevonne Pax RN ?Entered By: Yevonne Pax on 01/16/2022 14:09:52 ?[name], [name] R. ([id number]) ?-------------------------------------------------------------------------------- ?Clinic Level of Care Assessment Details ?Patient Name: [name], [name] ?Date of Service: 01/16/2022 2:00 PM ?Medical Record Number: [id number] ?Patient Account Number: [id number] ?Date of Birth/Sex: [birth year] (76 y.o. M) ?Treating RN: Yevonne Pax ?Primary Care Casmira Cramer: [name]  Other Clinician: ?Referring Loraina Stauffer: [name] ?Treating Kycen Spalla/Extender: Allen Derry ?Weeks in Treatment: 5 ?Clinic Level of Care Assessment Items ?TOOL 1 Quantity Score ?[]  - Use when EandM and Procedure is performed on INITIAL visit 0 ?ASSESSMENTS - Nursing Assessment / Reassessment ?[]  - General Physical Exam (combine w/ comprehensive assessment (listed just below) when performed on new ?0 ?pt. evals) ?[]  - 0 ?Comprehensive Assessment (HX, ROS, Risk Assessments, Wounds Hx, etc.) ?ASSESSMENTS - Wound and Skin Assessment / Reassessment ?[]  - Dermatologic / Skin Assessment (not related to wound area) 0 ?ASSESSMENTS - Ostomy and/or Continence Assessment and Care ?[]  - Incontinence Assessment and Management 0 ?[]  - 0 ?Ostomy Care Assessment and Management (repouching, etc.) ?PROCESS - Coordination of Care ?[]  - Simple Patient / Family Education for ongoing care 0 ?[]  - 0 ?Complex (extensive) Patient / Family Education for ongoing care ?[]  - 0 ?Staff obtains Consents, Records, Test Results / Process Orders ?[]  - 0 ?Staff telephones HHA, Nursing Homes / Clarify orders / etc ?[]  - 0 ?Routine Transfer to another Facility (non-emergent condition) ?[]  - 0 ?Routine Hospital Admission (non-emergent condition) ?[]  - 0 ?New Admissions / Manufacturing engineer / Ordering NPWT, Apligraf, etc. ?[]  - 0 ?Emergency Hospital Admission (emergent condition) ?PROCESS - Special Needs ?[]  - Pediatric / Minor Patient Management 0 ?[]  - 0 ?Isolation Patient Management ?[]  - 0 ?Hearing / Language / Visual special needs ?[]  - 0 ?Assessment of Community assistance (transportation, [name], etc.) ?[]  - 0 ?Additional assistance / Altered mentation ?[]  - 0 ?Support Surface(s) Assessment (bed, cushion, seat, etc.) ?INTERVENTIONS - Miscellaneous ?[]  - External ear exam 0 ?[]  - 0 ?Patient Transfer (multiple staff / Nurse, adult / Similar devices) ?[]  - 0 ?Simple Staple / Suture removal (25 or less) ?[]  - 0 ?Complex Staple / Suture  removal (26 or more) ?[]  - 0 ?Hypo/Hyperglycemic Management (do not check if billed separately) ?[]  - 0 ?Ankle / Brachial Index (ABI) -  do not check if billed separately ?Has the patient been seen at the hospital within the last three years: Yes ?Total Score: 0 ?Level Of Care: ____ ?[name], [name] ([id number]) ?Electronic Signature(s) ?Signed: 01/23/2022 9:16:22 AM By: Yevonne Pax RN ?Entered By: Yevonne Pax on 01/16/2022 16:12:34 ?[name], [name] R. ([id number]) ?-------------------------------------------------------------------------------- ?Encounter Discharge Information Details ?Patient Name: [name], [name] ?Date of Service: 01/16/2022 2:00 PM ?Medical Record Number: [id number] ?Patient Account Number: [id number] ?Date of Birth/Sex: December 30, 1945 (76 y.o. M) ?Treating RN: Yevonne Pax ?Primary Care Oberon Hehir: [name] Other Clinician: ?Referring Fernado Brigante: [name] ?Treating Devonna Oboyle/Extender: Allen Derry ?Weeks in Treatment: 5 ?Encounter Discharge Information Items Post Procedure Vitals ?Discharge Condition: Stable ?Temperature (?F): 98 ?Ambulatory Status: Ambulatory ?Pulse (bpm): 68 ?Discharge Destination: Home ?Respiratory Rate (breaths/min): 18 ?Transportation: Private Auto ?Blood Pressure (mmHg): 200/84 ?Accompanied By: self ?Schedule Follow-up Appointment: Yes ?Clinical Summary of Care: Patient Declined ?Electronic Signature(s) ?Signed: 01/16/2022 4:16:19 PM By: Yevonne Pax RN ?Entered By: Yevonne Pax on 01/16/2022 16:16:19 ?[name], [name] R. ([id number]) ?-------------------------------------------------------------------------------- ?Lower Extremity Assessment Details ?Patient Name: [name], [name] ?Date of Service: 01/16/2022 2:00 PM ?Medical Record Number: [id number] ?Patient Account Number: [id number] ?Date of Birth/Sex: [birth year] (76 y.o. M) ?Treating RN: Yevonne Pax ?Primary Care Annalis Kaczmarczyk: [name] Other Clinician: ?Referring Veleda Mun: [name] ?Treating Karee Forge/Extender:  Allen Derry ?Weeks in Treatment: 5 ?Edema Assessment ?Assessed: [Left: No] [Right: No] ?Edema: [Left: N] [Right: o] ?Calf ?Left: Right: ?Point of Measurement: From Medial Instep 40 cm ?Ankle ?Left: Right: ?Point of Measurement: From Medial Instep 23 cm ?Vascular Assessment ?Pulses: ?Dorsalis Pedis ?Palpable: [Left:Yes] ?Electronic Signature(s) ?Signed: 01/23/2022 9:16:22 AM By: Yevonne Pax RN ?Entered By: Yevonne Pax on 01/16/2022 14:17:24 ?[name], [name] R. ([id number]) ?-------------------------------------------------------------------------------- ?Multi Wound Chart Details ?Patient Name: [name], [name] ?Date of Service: 01/16/2022 2:00 PM ?Medical Record Number: [id number] ?Patient Account Number: [id number] ?Date of Birth/Sex: 10/08/1946 (76 y.o. M) ?Treating RN: Yevonne Pax ?Primary Care Silvina Hackleman: [name] Other Clinician: ?Referring Kendell Gammon: [name] ?Treating Amelia Macken/Extender: Allen Derry ?Weeks in Treatment: 5 ?Vital Signs ?Height(in): 75 ?Pulse(bpm): 68 ?Weight(lbs): 245 ?Blood Pressure(mmHg): 200/84 ?Body Mass Index(BMI): 30.6 ?Temperature(??F): 98 ?Respiratory Rate(breaths/min): 18 ?Photos: [[name]:[name]] ?Wound Location: Left, Proximal, Medial Lower Leg [name] [name] ?Wounding Event: Gradually Appeared [name] [name] ?Primary Etiology: Venous Leg Ulcer [name] [name] ?Comorbid History: Arrhythmia, Hypertension, Type II [name] [name] ?Diabetes, Gout ?Date Acquired: 11/09/2021 [name] [name] ?Weeks of Treatment: 5 [name] [name] ?Wound Status: Open [name] [name] ?Wound Recurrence: No [name] [name] ?Measurements L x W x D (cm) 0.1x0.1x0.1 [name] [name] ?Area (cm?) : 0.008 [name] [name] ?Volume (cm?) : 0.001 [name] [name] ?% Reduction in Area: 99.80% [name] [name] ?% Reduction in Volume: 99.70% [name] [name] ?Classification: Full Thickness Without Exposed [name] [name] ?Support Structures ?Exudate Amount: Medium [name] [name] ?Exudate Type: Serosanguineous [name] [name] ?Exudate Color: red, brown [name] [name] ?Granulation Amount: None Present (0%) [name] [name] ?Necrotic Amount: Large (67-100%) [name]  [name] ?Necrotic Tissue: Eschar [name] [name] ?Exposed Structures: ?Fat Layer (Subcutaneous Tissue): [name] [name] ?Yes ?Fascia: No ?Tendon: No ?Muscle: No ?Joint: No ?Bone: No ?Epithelialization: None [name] [name] ?Treatment Notes ?Electro

## 2022-01-30 ENCOUNTER — Other Ambulatory Visit: Payer: Self-pay | Admitting: Physician Assistant

## 2022-01-30 ENCOUNTER — Encounter: Payer: Medicare PPO | Admitting: Physician Assistant

## 2022-01-30 DIAGNOSIS — I872 Venous insufficiency (chronic) (peripheral): Secondary | ICD-10-CM | POA: Diagnosis not present

## 2022-01-30 DIAGNOSIS — E1151 Type 2 diabetes mellitus with diabetic peripheral angiopathy without gangrene: Secondary | ICD-10-CM | POA: Diagnosis not present

## 2022-01-30 DIAGNOSIS — Z7901 Long term (current) use of anticoagulants: Secondary | ICD-10-CM | POA: Diagnosis not present

## 2022-01-30 DIAGNOSIS — I482 Chronic atrial fibrillation, unspecified: Secondary | ICD-10-CM | POA: Diagnosis not present

## 2022-01-30 DIAGNOSIS — L97822 Non-pressure chronic ulcer of other part of left lower leg with fat layer exposed: Secondary | ICD-10-CM | POA: Diagnosis not present

## 2022-01-30 DIAGNOSIS — Z85828 Personal history of other malignant neoplasm of skin: Secondary | ICD-10-CM | POA: Diagnosis not present

## 2022-01-30 DIAGNOSIS — L97812 Non-pressure chronic ulcer of other part of right lower leg with fat layer exposed: Secondary | ICD-10-CM | POA: Diagnosis not present

## 2022-01-30 DIAGNOSIS — C44799 Other specified malignant neoplasm of skin of left lower limb, including hip: Secondary | ICD-10-CM | POA: Diagnosis not present

## 2022-01-30 DIAGNOSIS — M109 Gout, unspecified: Secondary | ICD-10-CM | POA: Diagnosis not present

## 2022-01-30 NOTE — Progress Notes (Addendum)
RIVAAN, KENDALL ([id number]) ?Visit Report for 01/30/2022 ?Biopsy Details ?Patient Name: [name], [name] ?Date of Service: 01/30/2022 9:45 AM ?Medical Record Number: [id number] ?Patient Account Number: [id number] ?Date of Birth/Sex: [birth year] (76 y.o. M) ?Treating RN: Levora Dredge ?Primary Care Provider: [name] Other Clinician: ?Referring Provider: [name] ?Treating Provider/Extender: Jeri Cos ?Weeks in Treatment: 7 ?Biopsy Performed for: Wound #7R Right, Medial Lower Leg ?Location(s): Wound Margin ?Performed By: Physician Tommie Sams., PA-C ?Tissue Punch: Yes ?Size (mm): 5 ?Number of Specimens Taken: 1 ?Specimen Sent To Pathology: Yes ?Level of Consciousness (Pre-procedure): Awake and Alert ?Pre-procedure Verification/Time-Out Taken: Yes - 10:27 ?Pain Control: Lidocaine Injectable ?Lidocaine Percent: 2% ?Instrument: ?Other: punch biopsy ?Bleeding: Moderate ?Hemostasis Achieved: Silver Nitrate ?Procedural Pain: 0 ?Post Procedural Pain: 0 ?Response to Treatment: Procedure was tolerated well ?Level of Consciousness (Post-procedure): Awake and Alert ?Post Procedure Diagnosis ?Same as Pre-procedure ?Notes ?1 stick silver nitrate used ?Electronic Signature(s) ?Signed: 01/30/2022 4:54:44 PM By: Levora Dredge ?Signed: 01/30/2022 6:11:27 PM By: [name] PA-C ?Entered By: Levora Dredge on 01/30/2022 10:32:35 ?[name], [name] R. ([id number]) ?-------------------------------------------------------------------------------- ?Chief Complaint Document Details ?Patient Name: [name], [name] ?Date of Service: 01/30/2022 9:45 AM ?Medical Record Number: [id number] ?Patient Account Number: [id number] ?Date of Birth/Sex: 12-15-1945 (76 y.o. M) ?Treating RN: Levora Dredge ?Primary Care Provider: [name] Other Clinician: ?Referring Provider: [name] ?Treating Provider/Extender: Jeri Cos ?Weeks in Treatment: 7 ?Information Obtained from: Patient ?Chief Complaint ?Bilateral LE Ulcer ?Electronic  Signature(s) ?Signed: 01/30/2022 10:10:08 AM By: [name] PA-C ?Entered By: [name] on 01/30/2022 10:10:08 ?[name], [name] ([id number]) ?-------------------------------------------------------------------------------- ?HPI Details ?Patient Name: [name], [name] ?Date of Service: 01/30/2022 9:45 AM ?Medical Record Number: [id number] ?Patient Account Number: [id number] ?Date of Birth/Sex: 06/16/1946 (76 y.o. 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 ?the patient has had a vascular workup over 7 years ago and said everything was normal at that stage. He does not have any chronic problems ?except for cardiac issues which he sees a cardiologist in [place]. ?08/15/2017 -- arterial and venous duplex studies still pending. ?08/23/2017 -- venous reflux studies done on 08/13/2017 shows venous incompetence throughout the left lower extremity deep system and ?focally at the left saphenofemoral junction. 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 No evidence of SVT or DVT in bilateral ?lower extremities ?The patient has an appointment at the end of the month to get his arterial duplex study done ?09/05/2017 -- the patient was seen at the vein and vascular office yesterday by Angelena Form. ABI studies were notable for medial calcification ?and the toe brachial indices were normal and bilateral ankle-brachial)  waveforms were normal with triphasic flow. After review of his venous ?studies he was not a candidate for laser ablation and his lymphedema was to be treated with compression stockings and lymphedema pump ?pumps ?09/12/2017 -- had a low arterial study done at the [facility] -- unable to obtain reliable ABI is due to medial ?calcification. Bilateral toe indices were normal with the right being 1.01 and the left being 0.92 and the waveforms  were triphasic bilaterally. ?he did get hold of 30-40 mm compression stockings but is unable to put these on. We will try and get him alternative compression stockings. ?09/26/17- he is here in follow up evaluation of a right lower extremity ulcer;he is compliant in wearing compression stocking; ulcer almost ?epithelialized , anticipate healing next appointment ?Readmission: ?11/17 point upon evaluation patient's wound currently that he is seeing [place] for today is a skin cancerous lesion that was cleared away by his ?dermatologist on the left medial calf region. He tells me that this is a very similar thing to what he had done previously in fact the last time he ?saw him in 2018 this was also what was going on at that point. Nonetheless he feels that based on what he seeing currently that this is just ?having a lot of harder time healing although it is much closer to the surface than what he is experienced in the past. He notes that the initial ?removal was in June 2022 which was this year this is now November and still has not closed. He does have some edema and definitely I think that ?there is some venous component to his slow healing here. Also think that we can do something better than Vaseline to try to help with getting this ?to clear up as quickly as possible. He does have a history of atrial fibrillation and is on Eliquis otherwise he really has no major medical problems ?that would affect wound healing. ?09/07/2021 upon evaluation today patient  actually appears to be doing significantly better after having wrapped him last week. Overall I think that ?this is making significant improvements at this time which is great news. I do not see any evidence of infection which is great news as well. No ?fevers, chills, nausea, vomiting, or diarrhea. ?09/14/2021 upon evaluation today patient appears to be doing well with regard to his leg ulcer. He has been tolerating the dressing changes and ?overall I think that he is making excellent progress. I do not see any signs of active infection at this time. ?09/21/2021 upon evaluation today patient actually appears to be making good progress with regard to his wound this is again measuring smaller ?today no debridement seems to be necessary. We have been using a silver collagen dressing and I think that is doing an awesome job. ?09/28/2021 upon evaluation today patient appears to be doing well with regard to his leg currently. I do not see any signs of active infection at ?this time which is great news. No fevers, chills, nausea, vomiting, or diarrhea. I think this wound is very close to complete resolution. ?10/12/2021 upon evaluation today patient actually appears to be doing awesome in regard to his leg ulcer. In fact this appears to be completely ?healed based on what I am seeing currently. I do not see any evidence of active infection locally nor systemically at this time which is also great ?news. No fevers, chills, nausea, vomiting, or diarrhea. ?Readmission: ?12/07/2021 upon evaluation today patient presents for readmission here in the clinic. He was discharged on 10/12/2021 is completely healed. ?Unfortunately this has reopened at this point and he is having continual issues with new blisters over both lower extremities. This is even worse ?than what we previously saw. Nonetheless we did actually check his ABIs today and it did reveal that his ABIs were 0.55 on the left and 0.57 on ?the right. Subsequently this is a  definite change from his last arterial study which showed that he  did have good blood flow at 1.01 on the right ?and 0.92 on the left and that was right at the beginning of 2019. Nonetheless based on what we see curr

## 2022-01-31 NOTE — Progress Notes (Signed)
**Note [name]-Identified via Obfuscation** JABIN, KALICH ([id number]) ?Visit Report for 01/30/2022 ?Arrival Information Details ?Patient Name: [name], [name] ?Date of Service: 01/30/2022 9:45 AM ?Medical Record Number: [id number] ?Patient Account Number: [id number] ?Date of Birth/Sex: 02-17-1946 (76 y.o. M) ?Treating RN: Angelina Pih ?Primary Care Tamieka Rancourt: [name] Other Clinician: ?Referring Liviya Santini: [name] ?Treating Brodrick Curran/Extender: Allen Derry ?Weeks in Treatment: 7 ?Visit Information History Since Last Visit ?Added or deleted any medications: No ?Patient Arrived: Ambulatory ?Any new allergies or adverse reactions: No ?Arrival Time: 09:58 ?Had a fall or experienced change in No ?Accompanied By: self ?activities of daily living that may affect ?Transfer Assistance: None ?risk of falls: ?Patient Identification Verified: Yes ?Signs or symptoms of abuse/neglect since last visito No ?Secondary Verification Process Completed: Yes ?Hospitalized since last visit: No ?Patient Requires Transmission-Based No ?Has Dressing in Place as Prescribed: Yes ?Precautions: ?Has Compression in Place as Prescribed: Yes ?Patient Has Alerts: Yes ?Pain Present Now: No ?Patient Alerts: Patient on Blood ?Thinner ?NOT diabetic ?Xarelto ?AVVS ABI 12/13/21 [place] ?TBI L 0.72; R 0.75 ?Electronic Signature(s) ?Signed: 01/30/2022 4:54:44 PM By: Angelina Pih ?Entered By: Angelina Pih on 01/30/2022 10:00:37 ?BRINLEY, LEINER R. ([id number]) ?-------------------------------------------------------------------------------- ?Clinic Level of Care Assessment Details ?Patient Name: [name] ?Date of Service: 01/30/2022 9:45 AM ?Medical Record Number: [id number] ?Patient Account Number: [id number] ?Date of Birth/Sex: December 04, 1945 (76 y.o. M) ?Treating RN: Angelina Pih ?Primary Care Chantrell Apsey: [name] Other Clinician: ?Referring Nechama Escutia: [name] ?Treating Cosette Prindle/Extender: Allen Derry ?Weeks in Treatment: 7 ?Clinic Level of Care Assessment Items ?TOOL 1  Quantity Score ?[]  - Use when EandM and Procedure is performed on INITIAL visit 0 ?ASSESSMENTS - Nursing Assessment / Reassessment ?[]  - General Physical Exam (combine w/ comprehensive assessment (listed just below) when performed on new ?0 ?pt. evals) ?[]  - 0 ?Comprehensive Assessment (HX, ROS, Risk Assessments, Wounds Hx, etc.) ?ASSESSMENTS - Wound and Skin Assessment / Reassessment ?[]  - Dermatologic / Skin Assessment (not related to wound area) 0 ?ASSESSMENTS - Ostomy and/or Continence Assessment and Care ?[]  - Incontinence Assessment and Management 0 ?[]  - 0 ?Ostomy Care Assessment and Management (repouching, etc.) ?PROCESS - Coordination of Care ?[]  - Simple Patient / Family Education for ongoing care 0 ?[]  - 0 ?Complex (extensive) Patient / Family Education for ongoing care ?[]  - 0 ?Staff obtains Consents, Records, Test Results / Process Orders ?[]  - 0 ?Staff telephones HHA, Nursing Homes / Clarify orders / etc ?[]  - 0 ?Routine Transfer to another Facility (non-emergent condition) ?[]  - 0 ?Routine Hospital Admission (non-emergent condition) ?[]  - 0 ?New Admissions / Manufacturing engineer / Ordering NPWT, Apligraf, etc. ?[]  - 0 ?Emergency Hospital Admission (emergent condition) ?PROCESS - Special Needs ?[]  - Pediatric / Minor Patient Management 0 ?[]  - 0 ?Isolation Patient Management ?[]  - 0 ?Hearing / Language / Visual special needs ?[]  - 0 ?Assessment of Community assistance (transportation, [name], etc.) ?[]  - 0 ?Additional assistance / Altered mentation ?[]  - 0 ?Support Surface(s) Assessment (bed, cushion, seat, etc.) ?INTERVENTIONS - Miscellaneous ?[]  - External ear exam 0 ?[]  - 0 ?Patient Transfer (multiple staff / Nurse, adult / Similar devices) ?[]  - 0 ?Simple Staple / Suture removal (25 or less) ?[]  - 0 ?Complex Staple / Suture removal (26 or more) ?[]  - 0 ?Hypo/Hyperglycemic Management (do not check if billed separately) ?[]  - 0 ?Ankle / Brachial Index (ABI) - do not check if billed  separately ?Has the patient been seen at the hospital within the last three years: Yes ?Total Score: 0 ?Level Of Care: ____ ?  JAMOL, REIM ([id number]) ?Electronic Signature(s) ?Signed: 01/30/2022 4:54:44 PM By: Angelina Pih ?Entered By: Angelina Pih on 01/30/2022 16:51:12 ?[name], SRADER R. ([id number]) ?-------------------------------------------------------------------------------- ?Encounter Discharge Information Details ?Patient Name: [name] ?Date of Service: 01/30/2022 9:45 AM ?Medical Record Number: [id number] ?Patient Account Number: [id number] ?Date of Birth/Sex: 12-Mar-1946 (76 y.o. M) ?Treating RN: Angelina Pih ?Primary Care Anamari Galeas: [name] Other Clinician: ?Referring Sham Alviar: [name] ?Treating Sansa Alkema/Extender: Allen Derry ?Weeks in Treatment: 7 ?Encounter Discharge Information Items Post Procedure Vitals ?Discharge Condition: Stable ?Temperature (?F): 97.5 ?Ambulatory Status: Ambulatory ?Pulse (bpm): 56 ?Discharge Destination: Home ?Respiratory Rate (breaths/min): 18 ?Transportation: Private Auto ?Blood Pressure (mmHg): 157/73 ?Accompanied By: self ?Schedule Follow-up Appointment: Yes ?Clinical Summary of Care: Patient Declined ?Electronic Signature(s) ?Signed: 01/30/2022 4:53:11 PM By: Angelina Pih ?Entered By: Angelina Pih on 01/30/2022 16:53:11 ?AMED, PRETTY R. ([id number]) ?-------------------------------------------------------------------------------- ?Lower Extremity Assessment Details ?Patient Name: [name] ?Date of Service: 01/30/2022 9:45 AM ?Medical Record Number: [id number] ?Patient Account Number: [id number] ?Date of Birth/Sex: 28-Feb-1946 (76 y.o. M) ?Treating RN: Angelina Pih ?Primary Care Norm Wray: [name] Other Clinician: ?Referring Crosby Oriordan: [name] ?Treating Ryker Sudbury/Extender: Allen Derry ?Weeks in Treatment: 7 ?Edema Assessment ?Assessed: [Left: No] [Right: No] ?Edema: [Left: Yes] [Right: Yes] ?Calf ?Left: Right: ?Point of  Measurement: 35 cm From Medial Instep 36.5 cm 38 cm ?Ankle ?Left: Right: ?Point of Measurement: 13 cm From Medial Instep 23.4 cm 26 cm ?Vascular Assessment ?Pulses: ?Dorsalis Pedis ?Palpable: [Left:Yes] [Right:Yes] ?Electronic Signature(s) ?Signed: 01/30/2022 4:54:44 PM By: Angelina Pih ?Entered By: Angelina Pih on 01/30/2022 10:17:49 ?KAVEH, BLOWERS R. ([id number]) ?-------------------------------------------------------------------------------- ?Multi Wound Chart Details ?Patient Name: RONRICO, MANAHAN ?Date of Service: 01/30/2022 9:45 AM ?Medical Record Number: [id number] ?Patient Account Number: [id number] ?Date of Birth/Sex: May 20, 1946 (76 y.o. M) ?Treating RN: Angelina Pih ?Primary Care Eternity Dexter: [name] Other Clinician: ?Referring Irving Lubbers: [name] ?Treating Jhostin Epps/Extender: Allen Derry ?Weeks in Treatment: 7 ?Vital Signs ?Height(in): 75 ?Pulse(bpm): 56 ?Weight(lbs): 245 ?Blood Pressure(mmHg): 157/73 ?Body Mass Index(BMI): 30.6 ?Temperature(??F): 97.5 ?Respiratory Rate(breaths/min): 18 ?Photos: [[name]:[name]] ?Wound Location: Right, Medial Lower Leg Right, Medial, Anterior Lower Leg [name] ?Wounding Event: Gradually Appeared Gradually Appeared [name] ?Primary Etiology: Venous Leg Ulcer Venous Leg Ulcer [name] ?Comorbid History: Arrhythmia, Hypertension, Type II Arrhythmia, Hypertension, Type II [name] ?Diabetes, Gout Diabetes, Gout ?Date Acquired: 12/14/2021 01/22/2022 [name] ?Weeks of Treatment: 6 1 [name] ?Wound Status: Open Open [name] ?Wound Recurrence: Yes No [name] ?Measurements L x W x D (cm) 0.6x0.7x0.1 0.7x0.5x0.1 [name] ?Area (cm?) : 0.33 0.275 [name] ?Volume (cm?) : 0.033 0.027 [name] ?% Reduction in Area: -68.40% -16.50% [name] ?% Reduction in Volume: -65.00% -12.50% [name] ?Classification: Full Thickness Without Exposed Full Thickness Without Exposed [name] ?Support Structures Support Structures ?Exudate Amount: Medium Medium [name] ?Exudate Type: Serosanguineous Serosanguineous [name] ?Exudate Color: red, brown red, brown  [name] ?Granulation Amount: Medium (34-66%) Medium (34-66%) [name] ?Granulation Quality: Red, Pink Red, Pink [name] ?Necrotic Amount: Medium (34-66%) Medium (34-66%) [name] ?Exposed Structures: ?Fat Layer (Subcutaneous Tissue): ?Fat La

## 2022-02-01 LAB — SURGICAL PATHOLOGY

## 2022-02-08 DIAGNOSIS — L821 Other seborrheic keratosis: Secondary | ICD-10-CM | POA: Diagnosis not present

## 2022-02-08 DIAGNOSIS — L570 Actinic keratosis: Secondary | ICD-10-CM | POA: Diagnosis not present

## 2022-02-08 DIAGNOSIS — D225 Melanocytic nevi of trunk: Secondary | ICD-10-CM | POA: Diagnosis not present

## 2022-02-08 DIAGNOSIS — L853 Xerosis cutis: Secondary | ICD-10-CM | POA: Diagnosis not present

## 2022-02-08 DIAGNOSIS — Z85828 Personal history of other malignant neoplasm of skin: Secondary | ICD-10-CM | POA: Diagnosis not present

## 2022-02-09 ENCOUNTER — Encounter: Payer: Medicare PPO | Attending: Physician Assistant | Admitting: Physician Assistant

## 2022-02-09 DIAGNOSIS — I428 Other cardiomyopathies: Secondary | ICD-10-CM | POA: Diagnosis not present

## 2022-02-09 DIAGNOSIS — E11622 Type 2 diabetes mellitus with other skin ulcer: Secondary | ICD-10-CM | POA: Diagnosis not present

## 2022-02-09 DIAGNOSIS — Z85828 Personal history of other malignant neoplasm of skin: Secondary | ICD-10-CM | POA: Diagnosis not present

## 2022-02-09 DIAGNOSIS — L97812 Non-pressure chronic ulcer of other part of right lower leg with fat layer exposed: Secondary | ICD-10-CM | POA: Diagnosis not present

## 2022-02-09 DIAGNOSIS — L97822 Non-pressure chronic ulcer of other part of left lower leg with fat layer exposed: Secondary | ICD-10-CM | POA: Insufficient documentation

## 2022-02-09 DIAGNOSIS — E1151 Type 2 diabetes mellitus with diabetic peripheral angiopathy without gangrene: Secondary | ICD-10-CM | POA: Diagnosis not present

## 2022-02-09 DIAGNOSIS — E1165 Type 2 diabetes mellitus with hyperglycemia: Secondary | ICD-10-CM | POA: Diagnosis not present

## 2022-02-09 DIAGNOSIS — I70248 Atherosclerosis of native arteries of left leg with ulceration of other part of lower left leg: Secondary | ICD-10-CM | POA: Diagnosis not present

## 2022-02-09 DIAGNOSIS — I7389 Other specified peripheral vascular diseases: Secondary | ICD-10-CM | POA: Diagnosis not present

## 2022-02-09 DIAGNOSIS — I872 Venous insufficiency (chronic) (peripheral): Secondary | ICD-10-CM | POA: Insufficient documentation

## 2022-02-09 DIAGNOSIS — I10 Essential (primary) hypertension: Secondary | ICD-10-CM | POA: Diagnosis not present

## 2022-02-09 DIAGNOSIS — Z7901 Long term (current) use of anticoagulants: Secondary | ICD-10-CM | POA: Insufficient documentation

## 2022-02-09 DIAGNOSIS — I480 Paroxysmal atrial fibrillation: Secondary | ICD-10-CM | POA: Diagnosis not present

## 2022-02-09 NOTE — Progress Notes (Signed)
NAT, WALLMAN ([id number]) ?Visit Report for 02/09/2022 ?Arrival Information Details ?Patient Name: [name], [name] ?Date of Service: 02/09/2022 9:15 AM ?Medical Record Number: [id number] ?Patient Account Number: [id number] ?Date of Birth/Sex: May 28, 1946 (76 y.o. M) ?Treating RN: Hansel Feinstein ?Primary Care Lylliana Kitamura: [name] Other Clinician: ?Referring Alyas Creary: [name] ?Treating Rees Matura/Extender: Allen Derry ?Weeks in Treatment: 9 ?Visit Information History Since Last Visit ?Added or deleted any medications: No ?Patient Arrived: Ambulatory ?Had a fall or experienced change in No ?Arrival Time: 09:31 ?activities of daily living that may affect ?Accompanied By: self ?risk of falls: ?Transfer Assistance: None ?Hospitalized since last visit: No ?Patient Identification Verified: Yes ?Has Dressing in Place as Prescribed: Yes ?Secondary Verification Process Completed: Yes ?Has Compression in Place as Prescribed: Yes ?Patient Requires Transmission-Based No ?Pain Present Now: No ?Precautions: ?Patient Has Alerts: Yes ?Patient Alerts: Patient on Blood ?Thinner ?NOT diabetic ?Xarelto ?AVVS ABI 12/13/21 [place] ?TBI L 0.72; R 0.75 ?Electronic Signature(s) ?Signed: 02/09/2022 1:28:17 PM By: Hansel Feinstein ?Entered ByHansel Feinstein on 02/09/2022 09:34:16 ?CYE, EINSTEIN R. ([id number]) ?-------------------------------------------------------------------------------- ?Clinic Level of Care Assessment Details ?Patient Name: [name] ?Date of Service: 02/09/2022 9:15 AM ?Medical Record Number: [id number] ?Patient Account Number: [id number] ?Date of Birth/Sex: July 19, 1946 (76 y.o. M) ?Treating RN: Hansel Feinstein ?Primary Care Bravery Ketcham: [name] Other Clinician: ?Referring Carlyn Lemke: [name] ?Treating Krystyl Cannell/Extender: Allen Derry ?Weeks in Treatment: 9 ?Clinic Level of Care Assessment Items ?TOOL 4 Quantity Score ?[]  - Use when only an EandM is performed on FOLLOW-UP visit 0 ?ASSESSMENTS - Nursing Assessment /  Reassessment ?[]  - Reassessment of Co-morbidities (includes updates in patient status) 0 ?[]  - 0 ?Reassessment of Adherence to Treatment Plan ?ASSESSMENTS - Wound and Skin Assessment / Reassessment ?[]  - Simple Wound Assessment / Reassessment - one wound 0 ?X- 2 5 ?Complex Wound Assessment / Reassessment - multiple wounds ?[]  - 0 ?Dermatologic / Skin Assessment (not related to wound area) ?ASSESSMENTS - Focused Assessment ?[]  - Circumferential Edema Measurements - multi extremities 0 ?[]  - 0 ?Nutritional Assessment / Counseling / Intervention ?[]  - 0 ?Lower Extremity Assessment (monofilament, tuning fork, pulses) ?[]  - 0 ?Peripheral Arterial Disease Assessment (using hand held doppler) ?ASSESSMENTS - Ostomy and/or Continence Assessment and Care ?[]  - Incontinence Assessment and Management 0 ?[]  - 0 ?Ostomy Care Assessment and Management (repouching, etc.) ?PROCESS - Coordination of Care ?X - Simple Patient / Family Education for ongoing care 1 15 ?[]  - 0 ?Complex (extensive) Patient / Family Education for ongoing care ?[]  - 0 ?Staff obtains Consents, Records, Test Results / Process Orders ?[]  - 0 ?Staff telephones HHA, Nursing Homes / Clarify orders / etc ?[]  - 0 ?Routine Transfer to another Facility (non-emergent condition) ?[]  - 0 ?Routine Hospital Admission (non-emergent condition) ?[]  - 0 ?New Admissions / Manufacturing engineer / Ordering NPWT, Apligraf, etc. ?[]  - 0 ?Emergency Hospital Admission (emergent condition) ?X- 1 10 ?Simple Discharge Coordination ?[]  - 0 ?Complex (extensive) Discharge Coordination ?PROCESS - Special Needs ?[]  - Pediatric / Minor Patient Management 0 ?[]  - 0 ?Isolation Patient Management ?[]  - 0 ?Hearing / Language / Visual special needs ?[]  - 0 ?Assessment of Community assistance (transportation, [name], etc.) ?[]  - 0 ?Additional assistance / Altered mentation ?[]  - 0 ?Support Surface(s) Assessment (bed, cushion, seat, etc.) ?INTERVENTIONS - Wound Cleansing /  Measurement ?JOCELYN, PIPITONE ([id number]) ?[]  - 0 ?Simple Wound Cleansing - one wound ?X- 2 5 ?Complex Wound Cleansing - multiple wounds ?X- 1 5 ?Wound Imaging (photographs - any number of wounds) ?[]  -  0 ?Wound Tracing (instead of photographs) ?[]  - 0 ?Simple Wound Measurement - one wound ?X- 2 5 ?Complex Wound Measurement - multiple wounds ?INTERVENTIONS - Wound Dressings ?X - Small Wound Dressing one or multiple wounds 2 10 ?[]  - 0 ?Medium Wound Dressing one or multiple wounds ?[]  - 0 ?Large Wound Dressing one or multiple wounds ?X- 1 5 ?Application of Medications - topical ?[]  - 0 ?Application of Medications - injection ?INTERVENTIONS - Miscellaneous ?[]  - External ear exam 0 ?[]  - 0 ?Specimen Collection (cultures, biopsies, blood, body fluids, etc.) ?[]  - 0 ?Specimen(s) / Culture(s) sent or taken to Lab for analysis ?[]  - 0 ?Patient Transfer (multiple staff / Nurse, adult / Similar devices) ?[]  - 0 ?Simple Staple / Suture removal (25 or less) ?[]  - 0 ?Complex Staple / Suture removal (26 or more) ?[]  - 0 ?Hypo / Hyperglycemic Management (close monitor of Blood Glucose) ?[]  - 0 ?Ankle / Brachial Index (ABI) - do not check if billed separately ?X- 1 5 ?Vital Signs ?Has the patient been seen at the hospital within the last three years: Yes ?Total Score: 90 ?Level Of Care: New/Established - Level ?3 ?Electronic Signature(s) ?Signed: 02/09/2022 1:28:17 PM By: Hansel Feinstein ?Entered ByHansel Feinstein on 02/09/2022 10:06:01 ?ORBAN, MCCRUDDEN R. ([id number]) ?-------------------------------------------------------------------------------- ?Encounter Discharge Information Details ?Patient Name: [name] ?Date of Service: 02/09/2022 9:15 AM ?Medical Record Number: [id number] ?Patient Account Number: [id number] ?Date of Birth/Sex: 10/24/1945 (76 y.o. M) ?Treating RN: Hansel Feinstein ?Primary Care Chandlor Noecker: [name] Other Clinician: ?Referring Haifa Hatton: [name] ?Treating Katherinne Mofield/Extender: Allen Derry ?Weeks in  Treatment: 9 ?Encounter Discharge Information Items ?Discharge Condition: Stable ?Ambulatory Status: Ambulatory ?Discharge Destination: Home ?Transportation: Private Auto ?Accompanied By: self ?Schedule Follow-up Appointment: Yes ?Clinical Summary of Care: ?Electronic Signature(s) ?Signed: 02/09/2022 1:28:17 PM By: Hansel Feinstein ?Entered ByHansel Feinstein on 02/09/2022 10:10:27 ?KERNIE, GERRING R. ([id number]) ?-------------------------------------------------------------------------------- ?Lower Extremity Assessment Details ?Patient Name: MARKON, TARDIO ?Date of Service: 02/09/2022 9:15 AM ?Medical Record Number: [id number] ?Patient Account Number: [id number] ?Date of Birth/Sex: 24-Feb-1946 (76 y.o. M) ?Treating RN: Hansel Feinstein ?Primary Care Krystel Fletchall: [name] Other Clinician: ?Referring Traniya Prichett: [name] ?Treating Rubie Ficco/Extender: Allen Derry ?Weeks in Treatment: 9 ?Edema Assessment ?Assessed: [Left: Yes] [Right: Yes] ?Edema: [Left: Yes] [Right: Yes] ?Calf ?Left: Right: ?Point of Measurement: 35 cm From Medial Instep 37 cm 38 cm ?Ankle ?Left: Right: ?Point of Measurement: 13 cm From Medial Instep 27 cm 26.5 cm ?Electronic Signature(s) ?Signed: 02/09/2022 1:28:17 PM By: Hansel Feinstein ?Entered ByHansel Feinstein on 02/09/2022 09:44:00 ?TEJAS, HARIRI R. ([id number]) ?-------------------------------------------------------------------------------- ?Multi Wound Chart Details ?Patient Name: JERMICHAEL, MAYVILLE ?Date of Service: 02/09/2022 9:15 AM ?Medical Record Number: [id number] ?Patient Account Number: [id number] ?Date of Birth/Sex: 11/20/1945 (76 y.o. M) ?Treating RN: Hansel Feinstein ?Primary Care Monigue Spraggins: [name] Other Clinician: ?Referring Stanislaus Kaltenbach: [name] ?Treating Bari Leib/Extender: Allen Derry ?Weeks in Treatment: 9 ?Vital Signs ?Height(in): 75 ?Pulse(bpm): 71 ?Weight(lbs): 245 ?Blood Pressure(mmHg): 148/80 ?Body Mass Index(BMI): 30.6 ?Temperature(??F): 968.3 ?Respiratory Rate(breaths/min): 16 ?Photos:  [[name]:[name]] ?Wound Location: Right, Medial Lower Leg Right, Medial, Anterior Lower Leg [name] ?Wounding Event: Gradually Appeared Gradually Appeared [name] ?Primary Etiology: Venous Leg Ulcer Venous Leg Ulcer [name] ?Comorbid History: Arrhythmia, Hyperten

## 2022-02-09 NOTE — Progress Notes (Addendum)
AUBERT, CHOYCE ([id number]) ?Visit Report for 02/09/2022 ?Chief Complaint Document Details ?Patient Name: [name], [name] ?Date of Service: 02/09/2022 9:15 AM ?Medical Record Number: [id number] ?Patient Account Number: [id number] ?Date of Birth/Sex: 1945-11-19 (76 y.o. M) ?Treating RN: Donnamarie Poag ?Primary Care Provider: [name] Other Clinician: ?Referring Provider: [name] ?Treating Provider/Extender: Jeri Cos ?Weeks in Treatment: 9 ?Information Obtained from: Patient ?Chief Complaint ?Bilateral LE Ulcer ?Electronic Signature(s) ?Signed: 02/09/2022 9:48:16 AM By: [name] PA-C ?Entered By: [name] on 02/09/2022 09:48:16 ?LOURDES, MANNING ([id number]) ?-------------------------------------------------------------------------------- ?HPI Details ?Patient Name: [name] ?Date of Service: 02/09/2022 9:15 AM ?Medical Record Number: [id number] ?Patient Account Number: [id number] ?Date of Birth/Sex: 02-09-1946 (76 y.o. 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 ?the patient has had a vascular workup over 7 years ago and said everything was normal at that stage. He does not have any chronic problems ?except for cardiac issues which he sees a cardiologist in [place]. ?08/15/2017 -- arterial and venous duplex studies still pending. ?08/23/2017 -- venous reflux studies done on 08/13/2017 shows venous incompetence  throughout the left lower extremity deep system and ?focally at the left saphenofemoral junction. 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 No evidence of SVT or DVT in bilateral ?lower extremities ?The patient has an appointment at the end of the month to get his arterial duplex study done ?09/05/2017 -- the patient was seen at the vein and vascular office yesterday by Angelena Form. ABI studies were notable for medial calcification ?and the toe brachial indices were normal and bilateral ankle-brachial) waveforms were normal with triphasic flow. After review of his venous ?studies he was not a candidate for laser ablation and his lymphedema was to be treated with compression stockings and lymphedema pump ?pumps ?09/12/2017 -- had a low arterial study done at [facility] -- unable to obtain reliable ABI is due to medial ?calcification. Bilateral toe indices were normal with the right being 1.01 and the left being 0.92 and the waveforms were triphasic bilaterally. ?he did get hold of 30-40 mm compression stockings but is unable to put these on. We will try and get him alternative compression stockings. ?09/26/17- he is here in follow up evaluation of a right lower extremity ulcer;he is compliant in wearing compression stocking; ulcer almost ?epithelialized , anticipate healing next appointment ?Readmission: ?11/17 point upon evaluation patient's wound currently that he is seeing [place] for today is a skin cancerous lesion that was cleared away by his ?dermatologist on the left medial calf region. He tells me that this is a very similar thing to what he had done previously in fact the last time he ?saw him in 2018 this was also what was going on at that point. Nonetheless he feels that based on what he seeing currently that this is just ?having a lot of harder time healing although it is much closer to the surface than what he is experienced in the past. He notes that the  initial ?removal was in June 2022 which was this year this is now  November and still has not closed. He does have some edema and definitely I think that ?there is some venous component to his slow healing here. Also think that we can do something better than Vaseline to try to help with getting this ?to clear up as quickly as possible. He does have a history of atrial fibrillation and is on Eliquis otherwise he really has no major medical problems ?that would affect wound healing. ?09/07/2021 upon evaluation today patient actually appears to be doing significantly better after having wrapped him last week. Overall I think that ?this is making significant improvements at this time which is great news. I do not see any evidence of infection which is great news as well. No ?fevers, chills, nausea, vomiting, or diarrhea. ?09/14/2021 upon evaluation today patient appears to be doing well with regard to his leg ulcer. He has been tolerating the dressing changes and ?overall I think that he is making excellent progress. I do not see any signs of active infection at this time. ?09/21/2021 upon evaluation today patient actually appears to be making good progress with regard to his wound this is again measuring smaller ?today no debridement seems to be necessary. We have been using a silver collagen dressing and I think that is doing an awesome job. ?09/28/2021 upon evaluation today patient appears to be doing well with regard to his leg currently. I do not see any signs of active infection at ?this time which is great news. No fevers, chills, nausea, vomiting, or diarrhea. I think this wound is very close to complete resolution. ?10/12/2021 upon evaluation today patient actually appears to be doing awesome in regard to his leg ulcer. In fact this appears to be completely ?healed based on what I am seeing currently. I do not see any evidence of active infection locally nor systemically at this time which is also great ?news. No  fevers, chills, nausea, vomiting, or diarrhea. ?Readmission: ?12/07/2021 upon evaluation today patient presents for readmission here in the clinic. He was discharged on 10/12/2021 is completely healed. ?Unfortunately this has reopened at this point and he is having continual issues with new blisters over both lower extremities. This is even worse ?than what we previously saw. Nonetheless we did actually check his ABIs today and it did reveal that his ABIs were 0.55 on the left and 0.57 on ?the right. Subsequently this is a definite change from his last arterial study which showed that he did have good blood flow at 1.01 on the right ?and 0.92 on the left and that was right at the beginning of 2019. Nonetheless based on what we see currently I do think he tolerated the 3 layer ?compression wrap but I do believe that we probably need to get him tested for his arterial flow in order to see where things stand and if there is ?something we can do there that would help prevent this from continue to be an ongoing issue. He did not utilize compression socks in the interim ?from when he was last here till this time. That something is probably going to need lifelong going forward as well. ?3/9; patient presents for follow-up. He has no issues or complaints today. He tolerated the compression wrap well. He had ABIs with TBI's done. FARRELL, PANTALEO ([id number]) ?He denies signs of infection. ?12/21/2021 upon evaluation today patient appears to be doing well with regard to the wounds on his legs. Both are showing signs of significant ?improvement which is great news although I do  believe some sharp debridement would be of benefit here as well. ?12/28/2021 upon evaluation today patient appears to be doing well with regard to his wounds. Everything is showing signs of excellent ?improvement which I am very pleased about. I think that we are headed in the right direction here. Fortunately there does not appear to be any ?evidence of  infection which is great news there is a little bit of hypergranulation. ?01/04/2022 upon evaluation today patient appears to be doing well with regard to his wounds 2 of them are healed 1 is almost so and the other

## 2022-03-01 DIAGNOSIS — C44722 Squamous cell carcinoma of skin of right lower limb, including hip: Secondary | ICD-10-CM | POA: Diagnosis not present

## 2022-03-02 ENCOUNTER — Ambulatory Visit: Payer: Medicare PPO | Admitting: Physician Assistant

## 2022-03-08 ENCOUNTER — Ambulatory Visit: Payer: Medicare PPO | Admitting: Physician Assistant

## 2022-03-08 DIAGNOSIS — Z85828 Personal history of other malignant neoplasm of skin: Secondary | ICD-10-CM | POA: Diagnosis not present

## 2022-03-15 DIAGNOSIS — Z85828 Personal history of other malignant neoplasm of skin: Secondary | ICD-10-CM | POA: Diagnosis not present

## 2022-03-16 ENCOUNTER — Other Ambulatory Visit: Payer: Self-pay | Admitting: Cardiology

## 2022-03-16 DIAGNOSIS — I872 Venous insufficiency (chronic) (peripheral): Secondary | ICD-10-CM

## 2022-03-29 DIAGNOSIS — Z85828 Personal history of other malignant neoplasm of skin: Secondary | ICD-10-CM | POA: Diagnosis not present

## 2022-05-17 DIAGNOSIS — Z85828 Personal history of other malignant neoplasm of skin: Secondary | ICD-10-CM | POA: Diagnosis not present

## 2022-05-29 ENCOUNTER — Ambulatory Visit: Payer: Medicare PPO | Admitting: Family Medicine

## 2022-06-19 DIAGNOSIS — L97821 Non-pressure chronic ulcer of other part of left lower leg limited to breakdown of skin: Secondary | ICD-10-CM | POA: Diagnosis not present

## 2022-06-19 DIAGNOSIS — D2271 Melanocytic nevi of right lower limb, including hip: Secondary | ICD-10-CM | POA: Diagnosis not present

## 2022-06-19 DIAGNOSIS — D225 Melanocytic nevi of trunk: Secondary | ICD-10-CM | POA: Diagnosis not present

## 2022-06-19 DIAGNOSIS — L821 Other seborrheic keratosis: Secondary | ICD-10-CM | POA: Diagnosis not present

## 2022-06-19 DIAGNOSIS — L89329 Pressure ulcer of left buttock, unspecified stage: Secondary | ICD-10-CM | POA: Diagnosis not present

## 2022-06-19 DIAGNOSIS — L98499 Non-pressure chronic ulcer of skin of other sites with unspecified severity: Secondary | ICD-10-CM | POA: Diagnosis not present

## 2022-06-19 DIAGNOSIS — D2272 Melanocytic nevi of left lower limb, including hip: Secondary | ICD-10-CM | POA: Diagnosis not present

## 2022-06-19 DIAGNOSIS — D2261 Melanocytic nevi of right upper limb, including shoulder: Secondary | ICD-10-CM | POA: Diagnosis not present

## 2022-06-19 DIAGNOSIS — L97811 Non-pressure chronic ulcer of other part of right lower leg limited to breakdown of skin: Secondary | ICD-10-CM | POA: Diagnosis not present

## 2022-06-19 DIAGNOSIS — D2262 Melanocytic nevi of left upper limb, including shoulder: Secondary | ICD-10-CM | POA: Diagnosis not present

## 2022-06-28 DIAGNOSIS — Z85828 Personal history of other malignant neoplasm of skin: Secondary | ICD-10-CM | POA: Diagnosis not present

## 2022-07-17 ENCOUNTER — Ambulatory Visit: Payer: Medicare PPO | Admitting: Cardiology

## 2022-07-31 ENCOUNTER — Ambulatory Visit: Payer: Medicare PPO | Admitting: Physician Assistant

## 2022-08-02 ENCOUNTER — Encounter: Payer: Medicare PPO | Attending: Physician Assistant | Admitting: Physician Assistant

## 2022-08-02 DIAGNOSIS — L97812 Non-pressure chronic ulcer of other part of right lower leg with fat layer exposed: Secondary | ICD-10-CM | POA: Diagnosis not present

## 2022-08-02 DIAGNOSIS — I428 Other cardiomyopathies: Secondary | ICD-10-CM | POA: Diagnosis not present

## 2022-08-02 DIAGNOSIS — L97822 Non-pressure chronic ulcer of other part of left lower leg with fat layer exposed: Secondary | ICD-10-CM | POA: Diagnosis not present

## 2022-08-02 DIAGNOSIS — I87333 Chronic venous hypertension (idiopathic) with ulcer and inflammation of bilateral lower extremity: Secondary | ICD-10-CM | POA: Insufficient documentation

## 2022-08-02 DIAGNOSIS — D485 Neoplasm of uncertain behavior of skin: Secondary | ICD-10-CM | POA: Diagnosis not present

## 2022-08-02 DIAGNOSIS — M109 Gout, unspecified: Secondary | ICD-10-CM | POA: Diagnosis not present

## 2022-08-02 DIAGNOSIS — Z7901 Long term (current) use of anticoagulants: Secondary | ICD-10-CM | POA: Diagnosis not present

## 2022-08-02 DIAGNOSIS — I739 Peripheral vascular disease, unspecified: Secondary | ICD-10-CM | POA: Diagnosis not present

## 2022-08-02 DIAGNOSIS — I480 Paroxysmal atrial fibrillation: Secondary | ICD-10-CM | POA: Diagnosis not present

## 2022-08-02 DIAGNOSIS — I872 Venous insufficiency (chronic) (peripheral): Secondary | ICD-10-CM | POA: Insufficient documentation

## 2022-08-02 DIAGNOSIS — I7389 Other specified peripheral vascular diseases: Secondary | ICD-10-CM | POA: Diagnosis not present

## 2022-08-02 DIAGNOSIS — N400 Enlarged prostate without lower urinary tract symptoms: Secondary | ICD-10-CM | POA: Diagnosis not present

## 2022-08-02 DIAGNOSIS — I10 Essential (primary) hypertension: Secondary | ICD-10-CM | POA: Insufficient documentation

## 2022-08-02 DIAGNOSIS — I87332 Chronic venous hypertension (idiopathic) with ulcer and inflammation of left lower extremity: Secondary | ICD-10-CM | POA: Diagnosis not present

## 2022-08-02 NOTE — Progress Notes (Signed)
ABDIHAFID, LANDMARK R ([id number]) [id number] Page 1 of 11 Visit Report for 08/02/2022 Allergy List Details Patient Name: Date of Service: [name] 08/02/2022 1:00 PM Medical Record Number: [id number] Patient Account Number: [id number] Date of Birth/Sex: Treating RN: 21-Jul-1946 (76 y.o. [name] Primary Care Kendall Arnell: [name] Other Clinician: Referring Tapanga Ottaway: Treating Nochum Fenter/Extender: Rosaland Lao in Treatment: 0 Allergies Active Allergies No Known Allergies Allergy Notes Electronic Signature(s) Signed: 08/02/2022 4:52:35 PM By: [name] MSN RN CNS WTA Entered By: [name] on 08/02/2022 13:48:08 -------------------------------------------------------------------------------- Arrival Information Details Patient Name: Date of Service: [name], [name] 08/02/2022 1:00 PM Medical Record Number: [id number] Patient Account Number: [id number] Date of Birth/Sex: Treating RN: 1946/10/07 (76 y.o. 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 L)0.72 Earlyne Iba; NOT diabetic HISTORY OF SKIN CANCERS POET, STUTE ([id number]) History Since Last Visit All ordered tests and consults were completed: No Added or deleted any medications: No Any new allergies or adverse reactions: No Had a fall or experienced change in activities of daily living that may affect risk of falls: No Signs or symptoms of abuse/neglect since last visito No Hospitalized since last  visit: No Implantable device outside of the clinic excluding cellular tissue based products placed in the center since last visit: No Pain Present Now: No [id number] Page 2 of 11 Electronic Signature(s) Signed: 08/02/2022 1:50:56 PM By: Elliot Gurney, BSN, RN, CWS, Kim RN, BSN Previous Signature: 08/02/2022 1:49:36 PM Version By: Elliot Gurney, BSN, RN, CWS, Kim RN, BSN Entered By: Elliot Gurney, BSN, RN, CWS, Kim on 08/02/2022 13:50:56 -------------------------------------------------------------------------------- Lower Extremity Assessment Details Patient Name: Date of Service: [name], [name] 08/02/2022 1:00 PM Medical Record Number: [id number] Patient Account Number: [id number] Date of Birth/Sex: Treating RN: 1946/07/31 (76 y.o. Loel Lofty, Selena Batten Primary Care Kendan Cornforth: [name] Other Clinician: Referring Paulyne Mooty: Treating Whalen Trompeter/Extender: Rosaland Lao in Treatment: 0 Edema Assessment Assessed: Kyra Searles: No] Franne Forts: No] [Left: Edema] [Right: :] Calf Left: Right: Point of Measurement: 37 cm From Medial Instep 39.5 cm 43 cm Ankle Left: Right: Point of Measurement: 13 cm From Medial Instep 26 cm 27 cm Vascular Assessment Pulses: Dorsalis Pedis Palpable: [Left:Yes] [Right:Yes] Doppler Audible: [Left:Yes] [Right:Yes] Posterior Tibial Palpable: [Left:Yes Yes] [Right:Yes Yes] Notes +3 pitting edema in both legs. Patient states he has fluid pills but has not taken them. Per patient he can take them daily for 3 days at a time. Patient states he will take them when he gets home, PA notified. Electronic Signature(s) Signed: 08/02/2022 4:06:10 PM By: Elliot Gurney, BSN, RN, CWS, Kim RN, BSN Signed: 08/02/2022 4:52:35 PM By: [name] MSN RN CNS WTA Previous Signature: 08/02/2022 1:44:05 PM Version By: Elliot Gurney BSN, RN, CWS, Kim RN, BSN Entered By: [name] on 08/02/2022 13:48:03 TOBIE, HEMPHILL R ([id number]) [id number] Page 3 of  11 -------------------------------------------------------------------------------- Multi Wound Chart Details Patient Name: Date of Service: [name] 08/02/2022 1:00 PM Medical Record Number: [id number] Patient Account Number: [id number] Date of Birth/Sex: Treating RN: 08-10-1946 (76 y.o. [name] Primary Care Makenzy Krist: [name] Other Clinician: Referring Dallas Torok: Treating Kiondra Caicedo/Extender: Rosaland Lao in  Treatment: 0 Vital Signs Height(in): 75 Pulse(bpm): 50 Weight(lbs): 225 Blood Pressure(mmHg): 180/86 Body Mass Index(BMI): 28.1 Temperature(F): 98.0 Respiratory Rate(breaths/min): 18 [10:Photos:] Left, Medial Lower Leg Left, Posterior Lower Leg Right, Medial Lower Leg Wound Location: Gradually Appeared Gradually Appeared Gradually Appeared 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 Primary Etiology: Arrhythmia, Hypertension, Gout Arrhythmia, Hypertension, Gout Arrhythmia, Hypertension, Gout Comorbid History: 01/20/2022 01/20/2022 01/22/2022 Date Acquired: 0 0 0 Weeks of Treatment: Open Open Open Wound Status: No No No Wound Recurrence: 1.3x1.5x0.2 0.7x0.7x0.1 1.9x1.2x0.1 Measurements L x W x D (cm) 1.532 0.385 1.791 A (cm) : rea 0.306 0.038 0.179 Volume (cm) : Full Thickness Without Exposed Full Thickness Without Exposed Full Thickness Without Exposed Classification: Support Structures Support Structures Support Structures Medium Medium Large Exudate Amount: Serous Serous Serous Exudate Type: Psychologist, forensic Exudate Color: [name] Distinct, outline attached [name] Wound Margin: Small (1-33%) Small (1-33%) Small (1-33%) Granulation Amount: Red, Pink Red Red Granulation Quality: None Present (0%) None Present (0%) Large (67-100%) Necrotic Amount: Fat Layer (Subcutaneous Tissue): Yes Fat Layer (Subcutaneous Tissue): Yes Fascia: No Exposed Structures: Fascia: No Fascia: No Fat Layer  (Subcutaneous Tissue): No Tendon: No Tendon: No Tendon: No Muscle: No Muscle: No Muscle: No Joint: No Joint: No Joint: No Bone: No Bone: No Bone: No None Small (1-33%) None Epithelialization: Wound Number: [id number]  Photos: Right, Midline Lower Leg Right, Lateral Lower Leg Left, Lateral Lower Leg Wound Location: Gradually Appeared Gradually Appeared Gradually Appeared 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 Primary Etiology: Arrhythmia, Hypertension, Gout Arrhythmia, Hypertension, Gout Arrhythmia, Hypertension, Gout Comorbid History: 01/22/2022 01/20/2022 01/20/2022 Date Acquired: 0 0 0 Weeks of Treatment: Open Open Open Wound Status: No No No Wound Recurrence: 1x0.8x0.1 0.8x0.8x0.2 5.2x4.3x0.2 Measurements L x W x D (cm) 0.628 0.503 17.562 A (cm) : rea 0.063 0.101 3.512 Volume (cm) : Full Thickness Without Exposed Full Thickness Without Exposed Full Thickness Without Exposed Classification: Support Structures Support Structures Support Structures Large Medium Large Exudate AmountHAROL, MASONER ([id number]) [id number] Page 4 of 11 Serous Serous Serosanguineous Exudate Type: amber amber red, brown Exudate Color: [name] [name] Thickened Wound Margin: Small (1-33%) Small (1-33%) Small (1-33%) Granulation Amount: Red Red Red, Hyper-granulation Granulation Quality: Large (67-100%) [name] None Present (0%) Necrotic Amount: Fat Layer (Subcutaneous Tissue): Yes Fat Layer (Subcutaneous Tissue): Yes Fat Layer (Subcutaneous Tissue): Yes Exposed Structures: Fascia: No Fascia: No Fascia: No Tendon: No Tendon: No Tendon: No Muscle: No Muscle: No Muscle: No Joint: No Joint: No Joint: No Bone: No Bone: No Bone: No None Small (1-33%) Small (1-33%) Epithelialization: Treatment Notes Electronic Signature(s) Signed: 08/02/2022 4:52:35 PM By: [name] MSN RN CNS WTA Entered By: [name] on 08/02/2022  14:19:50 -------------------------------------------------------------------------------- Multi-Disciplinary Care Plan Details Patient Name: Date of Service: [name], [name] 08/02/2022 1:00 PM Medical Record Number: [id number] Patient Account Number: [id number] Date of Birth/Sex: Treating RN: Nov 13, 1945 (76 y.o. [name] Primary Care Neliah Cuyler: [name] Other Clinician: Referring Yoshi Mancillas: Treating Atharv Barriere/Extender: Rosaland Lao in Treatment: 0 Active Inactive Electronic Signature(s) Signed: 08/02/2022 4:52:35 PM By: [name] MSN RN CNS WTA Entered By: [name] on 08/02/2022 13:49:42 -------------------------------------------------------------------------------- Pain Assessment Details Patient Name: Date of Service: [name], [name] 08/02/2022 1:00 PM Medical Record Number: [id number] Patient Account Number: [id number] Date of Birth/Sex: Treating RN: Dec 18, 1945 (76 y.o. [name] Primary Care Willaim Mode: [name] Other Clinician: Referring Fadia Marlar: Treating Garald Rhew/Extender: Allen Derry Dasher,  Reece Packer in Treatment: [address] [place], RHAME R ([id number]) [id number] Page 5 of 11 Location of Pain Severity and Description of Pain Patient Has Paino No Site Locations Rate the pain. Current Pain Level: 0 Pain Management and Medication Current Pain Management: Electronic Signature(s) Signed: 08/02/2022 4:52:35 PM By: [name] MSN RN CNS WTA Entered By: [name] on 08/02/2022 13:13:51 -------------------------------------------------------------------------------- Wound Assessment Details Patient Name: Date of Service: [name], [name] 08/02/2022 1:00 PM Medical Record Number: [id number] Patient Account Number: [id number] Date of Birth/Sex: Treating RN: 05-06-1946 (76 y.o. [name] Primary Care Reyah Streeter: [name] Other Clinician: Referring  Olu[place]wa Kinyon: Treating Jalynne Persico/Extender: Rosaland Lao in Treatment: 0 Wound Status Wound Number: 10 Primary Etiology: Venous Leg Ulcer Wound Location: Left, Medial Lower Leg Wound Status: Open Wounding Event: Gradually Appeared Comorbid History: Arrhythmia, Hypertension, Gout Date Acquired: 01/20/2022 Weeks Of Treatment: 0 Clustered Wound: No Photos EYTAN, KRAVETS R ([id number]) [phone].pdf Page 6 of 11 Wound Measurements Length: (cm) 1.3 Width: (cm) 1.5 Depth: (cm) 0.2 Area: (cm) 1.532 Volume: (cm) 0.306 % Reduction in Area: % Reduction in Volume: Epithelialization: None Tunneling: No Undermining: No Wound Description Classification: Full Thickness Without Exposed Support Structures Exudate Amount: Medium Exudate Type: Serous Exudate Color: amber Foul Odor After Cleansing: No Slough/Fibrino No Wound Bed Granulation Amount: Small (1-33%) Exposed Structure Granulation Quality: Red, Pink Fascia Exposed: No Necrotic Amount: None Present (0%) Fat Layer (Subcutaneous Tissue) Exposed: Yes Tendon Exposed: No Muscle Exposed: No Joint Exposed: No Bone Exposed: No Electronic Signature(s) Signed: 08/02/2022 4:52:35 PM By: [name] MSN RN CNS WTA Entered By: [name] on 08/02/2022 13:43:37 -------------------------------------------------------------------------------- Wound Assessment Details Patient Name: Date of Service: [name], [name] 08/02/2022 1:00 PM Medical Record Number: [id number] Patient Account Number: [id number] Date of Birth/Sex: Treating RN: 1945/12/14 (76 y.o. [name] Primary Care Eleaner Dibartolo: [name] Other Clinician: Referring Tali Cleaves: Treating Jaima Janney/Extender: Rosaland Lao in Treatment: 0 Wound Status Wound Number: 11 Primary Etiology: Venous Leg Ulcer Wound Location: Left, Posterior Lower Leg Wound Status: Open Wounding Event: Gradually Appeared Comorbid  History: Arrhythmia, Hypertension, Gout Date Acquired: 01/20/2022 Weeks Of Treatment: 0 Clustered Wound: No Photos Wound Measurements Length: (cm) 0.7 KINGSLY, SCURRY R ([id number]) Width: (cm) 0.7 Depth: (cm) 0.1 Area: (cm) 0.385 Volume: (cm) 0.038 % Reduction in Area: [id number] Page 7 of 11 % Reduction in Volume: Epithelialization: Small (1-33%) Wound Description Classification: Full Thickness Without Exposed Suppor Wound Margin: Distinct, outline attached Exudate Amount: Medium Exudate Type: Serous Exudate Color: amber t Structures Foul Odor After Cleansing: No Slough/Fibrino No Wound Bed Granulation Amount: Small (1-33%) Exposed Structure Granulation Quality: Red Fascia Exposed: No Necrotic Amount: None Present (0%) Fat Layer (Subcutaneous Tissue) Exposed: Yes Tendon Exposed: No Muscle Exposed: No Joint Exposed: No Bone Exposed: No Electronic Signature(s) Signed: 08/02/2022 4:52:35 PM By: [name] MSN RN CNS WTA Entered By: [name] on 08/02/2022 13:44:27 -------------------------------------------------------------------------------- Wound Assessment Details Patient Name: Date of Service: [name], [name] 08/02/2022 1:00 PM Medical Record Number: [id number] Patient Account Number: [id number] Date of Birth/Sex: Treating RN: 12/14/1945 (76 y.o. Arthur Holms Primary Care Shea Kapur: [name] Other Clinician: Referring Kamari Buch: Treating Maurisio Ruddy/Extender: Rosaland Lao in Treatment: 0 Wound Status Wound Number: 12 Primary Etiology: Venous Leg Ulcer Wound Location: Right, Medial Lower Leg Wound Status: Open Wounding Event: Gradually Appeared Comorbid History: Arrhythmia, Hypertension, Gout Date Acquired: 01/22/2022 Weeks Of Treatment: 0 Clustered Wound: No Photos Wound  Measurements Length: (cm) 1.9 Width: (cm) 1.2 ALBEN, KOSIER R ([id number]) Depth: (cm) 0.1 Area: (cm) 1.791 Volume: (cm)  0.179 % Reduction in Area: % Reduction in Volume: [id number].pdf Page 8 of 11 Epithelialization: None Tunneling: No Undermining: No Wound Description Classification: Full Thickness Without Exposed Suppor Exudate Amount: Large Exudate Type: Serous Exudate Color: amber t Structures Foul Odor After Cleansing: No Slough/Fibrino Yes Wound Bed Granulation Amount: Small (1-33%) Exposed Structure Granulation Quality: Red Fascia Exposed: No Necrotic Amount: Large (67-100%) Fat Layer (Subcutaneous Tissue) Exposed: No Tendon Exposed: No Muscle Exposed: No Joint Exposed: No Bone Exposed: No Electronic Signature(s) Signed: 08/02/2022 4:06:10 PM By: Elliot Gurney, BSN, RN, CWS, Kim RN, BSN Signed: 08/02/2022 4:52:35 PM By: [name] MSN RN CNS WTA Previous Signature: 08/02/2022 1:39:28 PM Version By: Elliot Gurney, BSN, RN, CWS, Kim RN, BSN Entered By: [name] on 08/02/2022 13:45:47 -------------------------------------------------------------------------------- Wound Assessment Details Patient Name: Date of Service: [name], [name] 08/02/2022 1:00 PM Medical Record Number: [id number] Patient Account Number: [id number] Date of Birth/Sex: Treating RN: 1946/09/01 (76 y.o. Loel Lofty, Selena Batten Primary Care Croix Presley: [name] Other Clinician: Referring Shundra Wirsing: Treating Dorsie Burich/Extender: Rosaland Lao in Treatment: 0 Wound Status Wound Number: 13 Primary Etiology: Venous Leg Ulcer Wound Location: Right, Midline Lower Leg Wound Status: Open Wounding Event: Gradually Appeared Comorbid History: Arrhythmia, Hypertension, Gout Date Acquired: 01/22/2022 Weeks Of Treatment: 0 Clustered Wound: No Photos Wound Measurements Length: (cm) 1 Width: (cm) 0.8 Depth: (cm) 0.1 JAYRON, BOCCIO R ([id number]) Area: (cm) 0.628 Volume: (cm) 0.063 % Reduction in Area: % Reduction in Volume: Epithelialization: None [id number] Page 9  of 11 Tunneling: No Undermining: No Wound Description Classification: Full Thickness Without Exposed Support Structures Exudate Amount: Large Exudate Type: Serous Exudate Color: amber Foul Odor After Cleansing: No Slough/Fibrino Yes Wound Bed Granulation Amount: Small (1-33%) Exposed Structure Granulation Quality: Red Fascia Exposed: No Necrotic Amount: Large (67-100%) Fat Layer (Subcutaneous Tissue) Exposed: Yes Necrotic Quality: Adherent Slough Tendon Exposed: No Muscle Exposed: No Joint Exposed: No Bone Exposed: No Electronic Signature(s) Signed: 08/02/2022 4:06:10 PM By: Elliot Gurney, BSN, RN, CWS, Kim RN, BSN Signed: 08/02/2022 4:52:35 PM By: [name] MSN RN CNS WTA Previous Signature: 08/02/2022 1:40:56 PM Version By: Elliot Gurney, BSN, RN, CWS, Kim RN, BSN Entered By: [name] on 08/02/2022 13:46:43 -------------------------------------------------------------------------------- Wound Assessment Details Patient Name: Date of Service: [name], [name] 08/02/2022 1:00 PM Medical Record Number: [id number] Patient Account Number: [id number] Date of Birth/Sex: Treating RN: 01/10/1946 (76 y.o. [name] Primary Care Shayma Pfefferle: [name] Other Clinician: Referring Benyamin Jeff: Treating Tereza Gilham/Extender: Rosaland Lao in Treatment: 0 Wound Status Wound Number: 14 Primary Etiology: Venous Leg Ulcer Wound Location: Right, Lateral Lower Leg Wound Status: Open Wounding Event: Gradually Appeared Comorbid History: Arrhythmia, Hypertension, Gout Date Acquired: 01/20/2022 Weeks Of Treatment: 0 Clustered Wound: No Photos Wound Measurements Length: (cm) 0.8 Width: (cm) 0.8 Depth: (cm) 0.2 Area: (cm) 0.503 Derryberry, Ryosuke R ([id number]) Volume: (cm) 0.101 % Reduction in Area: % Reduction in Volume: Epithelialization: Small (1-33%) [id number] Page 10 of 11 Wound Description Classification: Full Thickness Without Exposed  Suppor Exudate Amount: Medium Exudate Type: Serous Exudate Color: amber t Structures Foul Odor After Cleansing: No Slough/Fibrino No Wound Bed Granulation Amount: Small (1-33%) Exposed Structure Granulation Quality: Red Fascia Exposed: No Fat Layer (Subcutaneous Tissue) Exposed: Yes Tendon Exposed: No Muscle Exposed: No Joint Exposed: No Bone Exposed: No Electronic Signature(s) Signed: 08/02/2022 4:52:35 PM By: [name] MSN RN CNS WTA Entered  By: [name] on 08/02/2022 13:47:14 -------------------------------------------------------------------------------- Wound Assessment Details Patient Name: Date of Service: [name] 08/02/2022 1:00 PM Medical Record Number: [id number] Patient Account Number: [id number] Date of Birth/Sex: Treating RN: 08/22/1946 (76 y.o. [name] Primary Care Ivylynn Hoppes: [name] Other Clinician: Referring Aryanna Shaver: Treating Mande Auvil/Extender: Rosaland Lao in Treatment: 0 Wound Status Wound Number: 9 Primary Etiology: Venous Leg Ulcer Wound Location: Left, Lateral Lower Leg Wound Status: Open Wounding Event: Gradually Appeared Comorbid History: Arrhythmia, Hypertension, Gout Date Acquired: 01/20/2022 Weeks Of Treatment: 0 Clustered Wound: No Photos Wound Measurements Length: (cm) 5.2 Width: (cm) 4.3 Depth: (cm) 0.2 Area: (cm) 17.562 Volume: (cm) 3.512 AWS, MALLARE R ([id number]) Wound Description Classification: Full Thickness Without Exposed Suppor Wound Margin: Thickened Exudate Amount: Large Exudate Type: Serosanguineous Exudate Color: red, brown t Structures Foul Odor After Cleansing: Slough/Fibrino % Reduction in Area: % Reduction in Volume: Epithelialization: Small (1-33%) [id number] Page 11 of 11 No Yes Wound Bed Granulation Amount: Small (1-33%) Exposed Structure Granulation Quality: Red, Hyper-granulation Fascia Exposed: No Necrotic Amount: None  Present (0%) Fat Layer (Subcutaneous Tissue) Exposed: Yes Tendon Exposed: No Muscle Exposed: No Joint Exposed: No Bone Exposed: No Electronic Signature(s) Signed: 08/02/2022 4:52:35 PM By: [name] MSN RN CNS WTA Entered By: [name] on 08/02/2022 14:27:00 -------------------------------------------------------------------------------- Vitals Details Patient Name: Date of Service: [name], [name] 08/02/2022 1:00 PM Medical Record Number: [id number] Patient Account Number: [id number] Date of Birth/Sex: Treating RN: May 31, 1946 (75 y.o. [name] Primary Care Amenah Tucci: [name] Other Clinician: Referring Joseline Mccampbell: Treating Alania Overholt/Extender: Rosaland Lao in Treatment: 0 Vital Signs Time Taken: 13:13 Temperature (F): 98.0 Height (in): 75 Pulse (bpm): 50 Source: Stated Respiratory Rate (breaths/min): 18 Weight (lbs): 225 Blood Pressure (mmHg): 180/86 Source: Stated Reference Range: 80 - 120 mg / dl Body Mass Index (BMI): 28.1 Electronic Signature(s) Signed: 08/02/2022 4:52:35 PM By: [name] MSN RN CNS WTA Entered By: [name] on 08/02/2022 13:17:01

## 2022-08-02 NOTE — Progress Notes (Signed)
[name] ([id number]) [id number] Page 1 of 5 Visit Report for 08/02/2022 Abuse Risk Screen Details Patient Name: Date of Service: [name] 08/02/2022 1:00 PM Medical Record Number: [id number] Patient Account Number: [id number] Date of Birth/Sex: Treating RN: [birth year] (76 y.o. [name] Primary Care Avia Merkley: [name] Other Clinician: Referring Rhylan Gross: Treating Karanvir Balderston/Extender: Rosaland Lao in Treatment: 0 Abuse Risk Screen Items Answer ABUSE RISK SCREEN: Has anyone close to you tried to hurt or harm you recentlyo No Do you feel uncomfortable with anyone in your familyo No Has anyone forced you do things that you didnt want to doo No Electronic Signature(s) Signed: 08/02/2022 4:52:35 PM By: [name] MSN RN CNS WTA Entered By: [name] on 08/02/2022 13:22:15 -------------------------------------------------------------------------------- Activities of Daily Living Details Patient Name: Date of Service: [name] 08/02/2022 1:00 PM Medical Record Number: [id number] Patient Account Number: [id number] Date of Birth/Sex: Treating RN: 07-25-1946 (75 y.o. [name] Primary Care Fatimata Talsma: [name] Other Clinician: Referring Emani Taussig: Treating Abdinasir Spadafore/Extender: Ros[place] Lao in Treatment: 0 Activities of Daily Living Items Answer Activities of Daily Living (Please select one for each item) Drive Automobile Completely Able T Medications ake Completely Able Use T elephone Completely Able Care for Appearance Completely Able Use T oilet Completely Able Bath / Shower Completely Able Dress Self Completely Able Feed Self Completely Able Walk Completely Able Get In / Out Bed Completely Able Housework Completely [name] ([id number]) [phone] [id number] Page 2 of 5 Prepare Meals Completely Able Handle Money  Completely Able Shop for Self Completely Able Electronic Signature(s) Signed: 08/02/2022 4:52:35 PM By: [name] MSN RN CNS WTA Entered By: [name] on 08/02/2022 13:22:37 -------------------------------------------------------------------------------- Education Screening Details Patient Name: Date of Service: [name] Edison, [name] 08/02/2022 1:00 PM Medical Record Number: [id number] Patient Account Number: [id number] Date of Birth/Sex: Treating RN: 31-Oct-1945 (76 y.o. [name] Primary Care Shelia Magallon: [name] Other Clinician: Referring Jacquel Mccamish: Treating Sirenity Shew/Extender: Rosaland Lao in Treatment: 0 Learning Preferences/Education Level/Primary Language Learning Preference: Explanation, Demonstration Preferred Language: English Cognitive Barrier Language Barrier: No Translator Needed: No Memory Deficit: No Emotional Barrier: No Cultural/Religious Beliefs Affecting Medical Care: No Physical Barrier Impaired Vision: No Impaired Hearing: No Decreased Hand dexterity: No Knowledge/Comprehension Knowledge Level: High Comprehension Level: High Ability to understand written instructions: High Ability to understand verbal instructions: High Motivation Anxiety Level: Calm Cooperation: Cooperative Education Importance: Acknowledges Need Interest in Health Problems: Asks Questions Perception: Coherent Willingness to Engage in Self-Management High Activities: Readiness to Engage in Self-Management High Activities: Electronic Signature(s) Signed: 08/02/2022 4:52:35 PM By: [name] MSN RN CNS WTA Entered By: [name] on 08/02/2022 13:23:29 [name] ([id number]) [phone] Page 3 of 5 -------------------------------------------------------------------------------- Fall Risk Assessment Details Patient Name: Date of Service: [name] 08/02/2022 1:00 PM Medical Record Number:  [id number] Patient Account Number: [id number] Date of Birth/Sex: Treating RN: [birth year] (76 y.o. [name] Primary Care Terril Chestnut: [name] Other Clinician: Referring Lawayne Hartig: Treating Hodges Treiber/Extender: Ros[place] Lao in Treatment: 0 Fall Risk Assessment Items Have you had 2 or more falls in the last 12 monthso 0 No Have you had any fall that resulted in injury in the last 12 monthso 0 No FALLS RISK SCREEN History of falling - immediate or within 3 months 0 No Secondary diagnosis (Do you have 2 or more medical diagnoseso) 0 No  Ambulatory aid None/bed rest/wheelchair/nurse 0 No Crutches/cane/walker 0 No Furniture 0 No Intravenous therapy Access/Saline/Heparin Lock 0 No Gait/Transferring Normal/ bed rest/ wheelchair 0 No Weak (short steps with or without shuffle, stooped but able to lift head while walking, may seek 0 No support from furniture) Impaired (short steps with shuffle, may have difficulty arising from chair, head down, impaired 0 No balance) Mental Status Oriented to own ability 0 Yes Electronic Signature(s) Signed: 08/02/2022 4:52:35 PM By: [name] MSN RN CNS WTA Entered By: [name] on 08/02/2022 13:24:14 -------------------------------------------------------------------------------- Foot Assessment Details Patient Name: Date of Service: [name] N, [name] 08/02/2022 1:00 PM Medical Record Number: [id number] Patient Account Number: [id number] Date of Birth/Sex: Treating RN: 09-22-1946 (76 y.o. [name] Primary Care Liborio Saccente: [name] Other Clinician: Referring Latima Hamza: Treating Liisa Picone/Extender: Ros[place] Lao in Treatment: 0 Foot Assessment Items Site Locations [place], [place] [name] ([id number]) [phone] [id number] Page 4 of 5 + = Sensation present, - = Sensation absent, C = Callus, U = Ulcer [name] = Redness, W = Warmth, M = Maceration, PU = Pre-ulcerative lesion F =  Fissure, S = Swelling, D = Dryness Assessment Right: Left: Other Deformity: No No Prior Foot Ulcer: No No Prior Amputation: No No Charcot Joint: No No Ambulatory Status: Gait: Electronic Signature(s) Signed: 08/02/2022 4:52:35 PM By: [name] MSN RN CNS WTA Entered By: [name] on 08/02/2022 13:28:52 -------------------------------------------------------------------------------- Nutrition Risk Screening Details Patient Name: Date of Service: [name] N, [name] 08/02/2022 1:00 PM Medical Record Number: [id number] Patient Account Number: [id number] Date of Birth/Sex: Treating RN: August 14, 1946 (76 y.o. [name] Primary Care Reef Achterberg: [name] Other Clinician: Referring Kyliegh Jester: Treating Jacobi Nile/Extender: Ros[place] Lao in Treatment: 0 Height (in): 75 Weight (lbs): 225 Body Mass Index (BMI): 28.1 Nutrition Risk Screening Items Score Screening NUTRITION RISK SCREEN: I have an illness or condition that made me change the kind and/or amount of food I eat 0 No I eat fewer than two meals per day 0 No I eat few fruits and vegetables, or milk products 0 No I have three or more drinks of beer, liquor or wine almost every day 0 No I have tooth or mouth problems that make it hard for me to eat 0 No I don't always have enough money to buy the food I need 0 No BROK, WILEMON [name] ([id number]) [phone] [id number] Page 5 of 5 I eat alone most of the time 0 No I take three or more different prescribed or over-the-counter drugs a day 0 No Without wanting to, I have lost or gained 10 pounds in the last six months 0 No I am not always physically able to shop, cook and/or feed myself 0 No Nutrition Protocols Good Risk Protocol 0 No interventions needed Moderate Risk Protocol High Risk Proctocol Risk Level: Good Risk Score: 0 Electronic Signature(s) Signed: 08/02/2022 4:52:35 PM By: [name] MSN RN CNS WTA Entered By: [name] on 08/02/2022 13:24:47

## 2022-08-02 NOTE — Progress Notes (Signed)
[name], [name] ([id number]) [id number] Page 1 of 12 Visit Report for 08/02/2022 Biopsy Details Patient Name: Date of Service: [name] [name] 08/02/2022 1:00 PM Medical Record Number: [id number] Patient Account Number: [id number] Date of Birth/Sex: Treating RN: 1946/03/10 (76 y.o. [name] Primary Care Provider: [name] Other Clinician: Referring Provider: Treating Provider/Extender: York Spaniel in Treatment: 0 Biopsy Performed for: Wound #9 Left, Lateral Lower Leg Location(s): Wound Margin Performed By: Physician Tommie Sams., PA-C Tissue Punch: Yes Size (mm): 3 Number of Specimens T aken: 1 Specimen Sent T Pathology: o Yes Level of Consciousness (Pre-procedure): Awake and Alert Pre-procedure Verification/Time-Out Taken: Yes - 14:15 Pain Control: Lidocaine Injectable Lidocaine Percent: 2% Instrument: Forceps, Scissors Hemostasis Achieved: Silver Nitrate Response to Treatment: Procedure was tolerated well Level of Consciousness (Post-procedure): Awake and Alert Post Procedure Diagnosis Same as Pre-procedure Notes 2 silver nitrate sticks used. Electronic Signature(s) Signed: 08/02/2022 4:52:35 PM By: [name] MSN RN CNS WTA Signed: 08/02/2022 5:00:04 PM By: [name] PA-C Entered By: [name] on 08/02/2022 14:21:36 -------------------------------------------------------------------------------- Chief Complaint Document Details Patient Name: Date of Service: [name], [name] 08/02/2022 1:00 PM Medical Record Number: [id number] Patient Account Number: [id number] Date of Birth/Sex: Treating RN: [birth year] (76 y.o. [name] Primary Care Provider: [name] Other Clinician: Referring Provider: Treating Provider/Extender: York Spaniel in Treatment: [place], [place] ([id number]) [id number] Page 2 of 12 Information Obtained from:  Patient Chief Complaint Bilateral LE Ulcers Electronic Signature(s) Signed: 08/02/2022 1:59:09 PM By: [name] PA-C Previous Signature: 08/02/2022 1:56:01 PM Version By: [name] PA-C Entered By: [name] on 08/02/2022 13:59:08 -------------------------------------------------------------------------------- HPI Details Patient Name: Date of Service: [name], [name] 08/02/2022 1:00 PM Medical Record Number: [id number] Patient Account Number: [id number] Date of Birth/Sex: Treating RN: [birth year] (76 y.o. [name], [name] Primary Care Provider: [name] Other Clinician: Referring Provider: Treating Provider/Extender: York Spaniel in Treatment: 0 History of Present Illness HPI Description: 76 year old male who has [name] past medical history of essential hypertension, chronic atrial fibrillation, peripheral vascular disease, nonischemic cardiomyopathy,venous stasis dermatitis, gouty arthropathy, basal cell carcinoma of the right lower extremity, benign prostatic hypertrophy, long- term use of anticoagulation therapy, hyperglycemia and exercise intolerance has never been [name] smoker. the patient has had [name] vascular workup over 7 years ago and said everything was normal at that stage. He does not have any chronic problems except for cardiac issues which he sees [name] cardiologist in [place]. 08/15/2017 -- arterial and venous duplex studies still pending. 08/23/2017 -- venous reflux studies done on 08/13/2017 shows venous incompetence throughout the left lower extremity deep system and focally at the left saphenofemoral junction. 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 No evidence of SVT or DVT in bilateral lower extremities The patient has an appointment at the end of the month to get his arterial duplex study done 09/05/2017 -- the patient was seen at the vein and vascular office yesterday by Angelena Form. ABI studies were notable for  medial calcification and the toe brachial indices were normal and bilateral ankle-brachial) waveforms were normal with triphasic flow. After review of his venous studies he was not [name] candidate for laser ablation and his lymphedema was to be treated with compression stockings and lymphedema pump pumps 09/12/2017 -- had [name] low arterial study done at the [facility] -- unable to obtain reliable ABI  is due to medial calcification. Bilateral toe indices were normal with the right being 1.01 and the left being 0.92 and the waveforms were triphasic bilaterally. he did get hold of 30-40 mm compression stockings but is unable to put these on. We will try and get him alternative compression stockings. 09/26/17- he is here in follow up evaluation of [name] right lower extremity ulcer;he is compliant in wearing compression stocking; ulcer almost epithelialized , anticipate healing next appointment Readmission: 11/17 point upon evaluation patient's wound currently that he is seeing [place] for today is [name] skin cancerous lesion that was cleared away by his dermatologist on the left medial calf region. He tells me that this is [name] very similar thing to what he had done previously in fact the last time he saw him in 2018 this was also what was going on at that point. Nonetheless he feels that based on what he seeing currently that this is just having [name] lot of harder time healing although it is much closer to the surface than what he is experienced in the past. He notes that the initial removal was in June 2022 which was this year this is now November and still has not closed. He does have some edema and definitely I think that there is some venous component to his slow healing here. Also think that we can do something better than Vaseline to try to help with getting this to clear up as quickly as possible. He does have [name] history of atrial fibrillation and is on Eliquis otherwise he really has no major  medical problems that would affect wound healing. 09/07/2021 upon evaluation today patient actually appears to be doing significantly better after having wrapped him last week. Overall I think that this is making significant improvements at this time which is great news. I do not see any evidence of infection which is great news as well. No fevers, chills, nausea, vomiting, or diarrhea. 09/14/2021 upon evaluation today patient appears to be doing well with regard to his leg ulcer. He has been tolerating the dressing changes and overall I think that he is making excellent progress. I do not see any signs of active infection at this time. 09/21/2021 upon evaluation today patient actually appears to be making good progress with regard to his wound this is again measuring smaller today no debridement seems to be necessary. We have been using [name] silver collagen dressing and I think that is doing an awesome job. 09/28/2021 upon evaluation today patient appears to be doing well with regard to his leg currently. I do not see any signs of active infection at this time which is great news. No fevers, chills, nausea, vomiting, or diarrhea. I think this wound is very close to complete resolution. 10/12/2021 upon evaluation today patient actually appears to be doing awesome in regard to his leg ulcer. In fact this appears to be completely healed based on what I am seeing currently. I do not see any evidence of active infection locally nor systemically at this time which is also great news. No fevers, chills, [name], [name] ([id number]) [id number] Page 3 of 12 nausea, vomiting, or diarrhea. Readmission: 12/07/2021 upon evaluation today patient presents for readmission here in the clinic. He was discharged on 10/12/2021 is completely healed. Unfortunately this has reopened at this point and he is having continual issues with new blisters over both lower extremities. This is even worse than what  we previously saw. Nonetheless we did actually check his ABIs today  and it did reveal that his ABIs were 0.55 on the left and 0.57 on the right. Subsequently this is [name] definite change from his last arterial study which showed that he did have good blood flow at 1.01 on the right and 0.92 on the left and that was right at the beginning of 2019. Nonetheless based on what we see currently I do think he tolerated the 3 layer compression wrap but I do believe that we probably need to get him tested for his arterial flow in order to see where things stand and if there is something we can do there that would help prevent this from continue to be an ongoing issue. He did not utilize compression socks in the interim from when he was last here till this time. That something is probably going to need lifelong going forward as well. 3/9; patient presents for follow-up. He has no issues or complaints today. He tolerated the compression wrap well. He had ABIs with TBI's done. He denies signs of infection. 12/21/2021 upon evaluation today patient appears to be doing well with regard to the wounds on his legs. Both are showing signs of significant improvement which is great news although I do believe some sharp debridement would be of benefit here as well. 12/28/2021 upon evaluation today patient appears to be doing well with regard to his wounds. Everything is showing signs of excellent improvement which I am very pleased about. I think that we are headed in the right direction here. Fortunately there does not appear to be any evidence of infection which is great news there is [name] little bit of hypergranulation. 01/04/2022 upon evaluation today patient appears to be doing well with regard to his wounds 2 of them are healed 1 is almost so and the other 1 is significantly better. Overall I am extremely pleased with where we stand and I think that he is making excellent progress here. I do not see any evidence of active  infection locally nor systemically at this time. 01-16-2022 upon evaluation today patient's wound on the left leg is showing signs of doing quite well. Has not completely cleared at this point but it is much improved. Fortunately I do not see any signs of infection at this time. No fevers, chills, nausea, vomiting, or diarrhea. 01-23-2022 upon evaluation today patient's wound of the left leg actually appears to be pretty much completely healed which is great news. I do not see any signs of active infection locally or systemically which is excellent. With that being said on the right leg what wound is measuring smaller the other 1 is [name] new wound that just showed up fortunately its not too bad. Has been using Xeroform here and that seems to be doing decently well which is great news. Unfortunately his blood pressure is significantly high we gave him the readings for the past 4-5 visits as well as [name] recommendation to make an appointment to go discuss this with his primary care provider patient states that he is going to look into doing this. 01-30-2022 upon evaluation today patient appears to be doing well with regard to his left leg everything appears to be healed. On the right leg the more anterior wound is healed the more medial wound that I been concerned about [name] possible skin cancer unfortunately still does not look great to me. I do believe that we should probably do [name] biopsy I have talked about it with him [name] few times I think though it is  probably time to go ahead and do this at this point. 02-09-2022 upon evaluation today patient appears to be doing well with regard to his legs. On the left this appears to be completely healed. On the right he does have 2 areas and be perfectly honest one of them is [name] skin cancer that he is going to the [facility] for the other seems to be healing nicely. Readmission: 08-02-2022 upon evaluation today patient appears for reevaluation here in our clinic  concerning issues that he has been having with wounds over the bilateral lower extremities. I last saw him in May 2023 and at that point we had him completely healed. Unfortunately he is tells me this has broken down to some degree since that point. 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 He tells me this 1 seems to just be getting bigger and bigger not improving. Fortunately he is not having any significant pain which is good news he does have quite [name] bit of swelling and he tells me that his fluid pills are not recommended for him to take daily but just in 3-day intervals here and there. Electronic Signature(s) Signed: 08/02/2022 4:48:41 PM By: [name] PA-C Entered By: [name] on 08/02/2022 16:48:41 -------------------------------------------------------------------------------- Physical Exam Details Patient Name: Date of Service: [name] Tyn[place], [name] 08/02/2022 1:00 PM Medical Record Number: [id number] Patient Account Number: [id number] Date of Birth/Sex: Treating RN: 1945-12-04 (76 y.o. [name] Primary Care Provider: [name] Other Clinician: Referring Provider: Treating Provider/Extender: York Spaniel in Treatment: 0 Constitutional Well-nourished and well-hydrated in no acute distress. Respiratory normal breathing without difficulty. [name], [name] ([id number]) [id number] Page 4 of 12 Psychiatric this patient is able to make decisions and demonstrates good insight into disease process. Alert and Oriented x 3. pleasant and cooperative. Notes Upon inspection patient's wound bed actually showed signs of good granulation and epithelization at this point. Fortunately I do not see any evidence of infection  locally or systemically which is great news and overall I am extremely pleased with where we stand today. I did not perform any debridement though I did take [name] biopsy from the left lateral leg at the largest wound site that I [name] send for pathology. Electronic Signature(s) Signed: 08/02/2022 4:49:09 PM By: [name] PA-C Entered By: [name] on 08/02/2022 16:49:09 -------------------------------------------------------------------------------- Physician Orders Details Patient Name: Date of Service: [name] [place], [name] 08/02/2022 1:00 PM Medical Record Number: [id number] Patient Account Number: [id number] Date of Birth/Sex: Treating RN: 05-Feb-1946 (76 y.o. 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 Nurse Visit as needed Bathing/ Shower/ Hygiene May shower with wound dressing protected with water repellent cover or cast protector. No tub bath.  Anesthetic (Use 'Patient Medications' Section for Anesthetic Order Entry) Wound #9 Left,Lateral Lower Leg Injectable lidocaine applied before procedure. Edema Control - Lymphedema / Segmental Compressive Device / Other Bilateral Lower Extremities Elevate, Exercise Daily and [name] void Standing for Long Periods of Time. Elevate legs to the level of the heart and pump ankles as  often as possible Elevate leg(s) parallel to the floor when sitting. DO YOUR BEST to sleep in the bed at night. DO NOT sleep in your recliner. Long hours of sitting in [name] recliner leads to swelling of the legs and/or potential wounds on your backside. Additional Orders / Instructions Follow Nutritious Diet and Increase Protein Intake Wound Treatment Wound #10 - Lower Leg Wound Laterality: Left, Medial Cleanser: Soap and Water 1 x Per Week/15 Days Discharge Instructions: Gently cleanse wound with antibacterial soap, rinse and pat dry prior to dressing wounds [name], [name] ([id number]) [id number] Page 5 of 12 Peri-Wound Care: Moisturizing Lotion 1 x Per Week/15 Days Discharge Instructions: Suggestions: Theraderm, Eucerin, Cetaphil, or patient preference. Prim Dressing: Silvercel Small 2x2 (in/in) (Generic) 1 x Per Week/15 Days ary Discharge Instructions: Apply Silvercel Small 2x2 (in/in) as instructed Secondary Dressing: ABD Pad 5x9 (in/in) 1 x Per Week/15 Days Discharge Instructions: Cover with ABD pad Compression Wrap: 3-LAYER WRAP - Profore Lite LF 3 Multilayer Compression Bandaging System 1 x Per Week/15 Days Discharge Instructions: Apply 3 multi-layer wrap as prescribed. Wound #11 - Lower Leg Wound Laterality: Left, Posterior Cleanser: Soap and Water 1 x Per Week/15 Days Discharge Instructions: Gently cleanse wound with antibacterial soap, rinse and pat dry prior to dressing wounds Peri-Wound Care: Moisturizing Lotion 1 x Per Week/15 Days Discharge Instructions: Suggestions: Theraderm, Eucerin, Cetaphil, or patient preference. Prim Dressing: Silvercel Small 2x2 (in/in) (Generic) 1 x Per Week/15 Days ary Discharge Instructions: Apply Silvercel Small 2x2 (in/in) as instructed Secondary Dressing: ABD Pad 5x9 (in/in) 1 x Per Week/15 Days Discharge Instructions: Cover with ABD pad Compression Wrap: 3-LAYER WRAP - Profore Lite LF 3 Multilayer Compression  Bandaging System 1 x Per Week/15 Days Discharge Instructions: Apply 3 multi-layer wrap as prescribed. Wound #12 - Lower Leg Wound Laterality: Right, Medial Cleanser: Soap and Water 1 x Per Week/15 Days Discharge Instructions: Gently cleanse wound with antibacterial soap, rinse and pat dry prior to dressing wounds Peri-Wound Care: Moisturizing Lotion 1 x Per Week/15 Days Discharge Instructions: Suggestions: Theraderm, Eucerin, Cetaphil, or patient preference. Prim Dressing: Silvercel Small 2x2 (in/in) (Generic) 1 x Per Week/15 Days ary Discharge Instructions: Apply Silvercel Small 2x2 (in/in) as instructed Secondary Dressing: ABD Pad 5x9 (in/in) 1 x Per Week/15 Days Discharge Instructions: Cover with ABD pad Compression Wrap: 3-LAYER WRAP - Profore Lite LF 3 Multilayer Compression Bandaging System 1 x Per Week/15 Days Discharge Instructions: Apply 3 multi-layer wrap as prescribed. Wound #13 - Lower Leg Wound Laterality: Right, Midline Cleanser: Soap and Water 1 x Per Week/15 Days Discharge Instructions: Gently cleanse wound with antibacterial soap, rinse and pat dry prior to dressing wounds Peri-Wound Care: Moisturizing Lotion 1 x Per Week/15 Days Discharge Instructions: Suggestions: Theraderm, Eucerin, Cetaphil, or patient preference. Prim Dressing: Silvercel Small 2x2 (in/in) (Generic) 1 x Per Week/15 Days ary Discharge Instructions: Apply Silvercel Small 2x2 (in/in) as instructed Secondary Dressing: ABD Pad 5x9 (in/in) 1 x Per Week/15 Days Discharge Instructions: Cover with ABD pad Compression Wrap: 3-LAYER WRAP - Profore Lite LF 3 Multilayer Compression Bandaging System 1 x Per Week/15 Days Discharge Instructions: Apply 3 multi-layer wrap as prescribed. Wound #14 - Lower Leg Wound  Laterality: Right, Lateral Cleanser: Soap and Water 1 x Per Week/15 Days Discharge Instructions: Gently cleanse wound with antibacterial soap, rinse and pat dry prior to dressing wounds Peri-Wound Care:  Moisturizing Lotion 1 x Per Week/15 Days Discharge Instructions: Suggestions: Theraderm, Eucerin, Cetaphil, or patient preference. Prim Dressing: Silvercel Small 2x2 (in/in) (Generic) 1 x Per Week/15 Days ary Discharge Instructions: Apply Silvercel Small 2x2 (in/in) as instructed Secondary Dressing: ABD Pad 5x9 (in/in) 1 x Per Week/15 Days Discharge Instructions: Cover with ABD pad Compression Wrap: 3-LAYER WRAP - Profore Lite LF 3 Multilayer Compression Bandaging System 1 x Per [address] [name], [name] ([id number]) [id number] Page 6 of 12 Discharge Instructions: Apply 3 multi-layer wrap as prescribed. Wound #9 - Lower Leg Wound Laterality: Left, Lateral Cleanser: Soap and Water 1 x Per Week/15 Days Discharge Instructions: Gently cleanse wound with antibacterial soap, rinse and pat dry prior to dressing wounds Peri-Wound Care: Moisturizing Lotion 1 x Per Week/15 Days Discharge Instructions: Suggestions: Theraderm, Eucerin, Cetaphil, or patient preference. Prim Dressing: Silvercel Small 2x2 (in/in) (Generic) 1 x Per Week/15 Days ary Discharge Instructions: Apply Silvercel Small 2x2 (in/in) as instructed Secondary Dressing: Zetuvit Plus 4x8 (in/in) 1 x Per Week/15 Days Compression Wrap: 3-LAYER WRAP - Profore Lite LF 3 Multilayer Compression Bandaging System 1 x Per Week/15 Days Discharge Instructions: Apply 3 multi-layer wrap as prescribed. Electronic Signature(s) Signed: 08/02/2022 4:52:35 PM By: [name] MSN RN CNS WTA Signed: 08/02/2022 5:00:04 PM By: [name] PA-C Entered By: [name] on 08/02/2022 14:25:10 -------------------------------------------------------------------------------- Problem List Details Patient Name: Date of Service: [name] [place], [name] 08/02/2022 1:00 PM Medical Record Number: [id number] Patient Account Number: [id number] Date of Birth/Sex: Treating RN: Jul 03, 1946 (76 y.o. [name], [name] Primary Care Provider:  [name] Other Clinician: Referring Provider: Treating Provider/Extender: York Spaniel in Treatment: 0 Active Problems ICD-10 Encounter Code Description Active Date MDM Diagnosis [phone] Chronic venous hypertension (idiopathic) with ulcer and inflammation of 08/02/2022 No Yes bilateral lower extremity L97.822 Non-pressure chronic ulcer of other part of left lower leg with fat layer 08/02/2022 No Yes exposed L97.812 Non-pressure chronic ulcer of other part of right lower leg with fat layer 08/02/2022 No Yes exposed I73.89 Other specified peripheral vascular diseases 08/02/2022 No Yes I48.0 Paroxysmal atrial fibrillation 08/02/2022 No Yes [place], MERGENTHALER [name] ([id number]) [id number] Page 7 of 12 I10 Essential (primary) hypertension 08/02/2022 No Yes Inactive Problems Resolved Problems Electronic Signature(s) Signed: 08/02/2022 1:58:50 PM By: [name] PA-C Previous Signature: 08/02/2022 1:58:09 PM Version By: [name] PA-C Previous Signature: 08/02/2022 1:55:55 PM Version By: [name] PA-C Entered By: [name] on 08/02/2022 13:58:49 -------------------------------------------------------------------------------- Progress Note Details Patient Name: Date of Service: [name] [place], [name] 08/02/2022 1:00 PM Medical Record Number: [id number] Patient Account Number: [id number] Date of Birth/Sex: Treating RN: 06/23/1946 (76 y.o. [name], [name] Primary Care Provider: [name] Other Clinician: Referring Provider: Treating Provider/Extender: York Spaniel in Treatment: 0 Subjective Chief Complaint Information obtained from Patient Bilateral LE Ulcers History of Present Illness (HPI) 76 year old male who has [name] past medical history of essential hypertension, chronic atrial fibrillation, peripheral vascular disease, nonischemic cardiomyopathy,venous stasis dermatitis, gouty arthropathy,  basal cell carcinoma of the right lower extremity, benign prostatic hypertrophy, long-term use of anticoagulation therapy, hyperglycemia and exercise intolerance has never been [name] smoker. the patient has had [name] vascular workup over 7 years ago and said everything was normal at that stage. He does not have  any chronic problems except for cardiac issues which he sees [name] cardiologist in [place]. 08/15/2017 -- arterial and venous duplex studies still pending. 08/23/2017 -- venous reflux studies done on 08/13/2017 shows venous incompetence throughout the left lower extremity deep system and focally at the left saphenofemoral junction. 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 No evidence of SVT or DVT in bilateral lower extremities The patient has an appointment at the end of the month to get his arterial duplex study done 09/05/2017 -- the patient was seen at the vein and vascular office yesterday by Angelena Form. ABI studies were notable for medial calcification and the toe brachial indices were normal and bilateral ankle-brachial) waveforms were normal with triphasic flow. After review of his venous studies he was not [name] candidate for laser ablation and his lymphedema was to be treated with compression stockings and lymphedema pump pumps 09/12/2017 -- had [name] low arterial study done at the [facility] -- unable to obtain reliable ABI is due to medial calcification. Bilateral toe indices were normal with the right being 1.01 and the left being 0.92 and the waveforms were triphasic bilaterally. he did get hold of 30-40 mm compression stockings but is unable to put these on. We will try and get him alternative compression stockings. 09/26/17- he is here in follow up evaluation of [name] right lower extremity ulcer;he is compliant in wearing compression stocking; ulcer almost epithelialized , anticipate healing next appointment Readmission: 11/17 point upon evaluation  patient's wound currently that he is seeing [place] for today is [name] skin cancerous lesion that was cleared away by his dermatologist on the left medial calf region. He tells me that this is [name] very similar thing to what he had done previously in fact the last time he saw him in 2018 this was also what was going on at that point. Nonetheless he feels that based on what he seeing currently that this is just having [name] lot of harder time healing although it is much closer to the surface than what he is experienced in the past. He notes that the initial removal was in June 2022 which was this year this is now November and still has not closed. He does have some edema and definitely I think that there is some venous component to his slow healing here. Also think that we can do something better than Vaseline to try to help with getting this to clear up as quickly as possible. He does have [name] history of atrial fibrillation and is on Eliquis otherwise he really has no major medical problems that would affect wound healing. 09/07/2021 upon evaluation today patient actually appears to be doing significantly better after having wrapped him last week. Overall I think that this is making significant improvements at this time which is great news. I do not see any evidence of infection which is great news as well. No fevers, chills, nausea, vomiting, or diarrhea. [name], JANI [name] ([id number]) [id number] Page 8 of 12 09/14/2021 upon evaluation today patient appears to be doing well with regard to his leg ulcer. He has been tolerating the dressing changes and overall I think that he is making excellent progress. I do not see any signs of active infection at this time. 09/21/2021 upon evaluation today patient actually appears to be making good progress with regard to his wound this is again measuring smaller today no debridement seems to be necessary. We have been using [name] silver collagen dressing and I  think that is doing an awesome job. 09/28/2021 upon evaluation today patient appears to be doing well with regard to his leg currently. I do not see any signs of active infection at this time which is great news. No fevers, chills, nausea, vomiting, or diarrhea. I think this wound is very close to complete resolution. 10/12/2021 upon evaluation today patient actually appears to be doing awesome in regard to his leg ulcer. In fact this appears to be completely healed based on what I am seeing currently. I do not see any evidence of active infection locally nor systemically at this time which is also great news. No fevers, chills, nausea, vomiting, or diarrhea. Readmission: 12/07/2021 upon evaluation today patient presents for readmission here in the clinic. He was discharged on 10/12/2021 is completely healed. Unfortunately this has reopened at this point and he is having continual issues with new blisters over both lower extremities. This is even worse than what we previously saw. Nonetheless we did actually check his ABIs today and it did reveal that his ABIs were 0.55 on the left and 0.57 on the right. Subsequently this is [name] definite change from his last arterial study which showed that he did have good blood flow at 1.01 on the right and 0.92 on the left and that was right at the beginning of 2019. Nonetheless based on what we see currently I do think he tolerated the 3 layer compression wrap but I do believe that we probably need to get him tested for his arterial flow in order to see where things stand and if there is something we can do there that would help prevent this from continue to be an ongoing issue. He did not utilize compression socks in the interim from when he was last here till this time. That something is probably going to need lifelong going forward as well. 3/9; patient presents for follow-up. He has no issues or complaints today. He tolerated the compression wrap well. He had ABIs  with TBI's done. He denies signs of infection. 12/21/2021 upon evaluation today patient appears to be doing well with regard to the wounds on his legs. Both are showing signs of significant improvement which is great news although I do believe some sharp debridement would be of benefit here as well. 12/28/2021 upon evaluation today patient appears to be doing well with regard to his wounds. Everything is showing signs of excellent improvement which I am very pleased about. I think that we are headed in the right direction here. Fortunately there does not appear to be any evidence of infection which is great news there is [name] little bit of hypergranulation. 01/04/2022 upon evaluation today patient appears to be doing well with regard to his wounds 2 of them are healed 1 is almost so and the other 1 is significantly better. Overall I am extremely pleased with where we stand and I think that he is making excellent progress here. I do not see any evidence of active infection locally nor systemically at this time. 01-16-2022 upon evaluation today patient's wound on the left leg is showing signs of doing quite well. Has not completely cleared at this point but it is much improved. Fortunately I do not see any signs of infection at this time. No fevers, chills, nausea, vomiting, or diarrhea. 01-23-2022 upon evaluation today patient's wound of the left leg actually appears to be pretty much completely healed which is great news. I do not see any signs of active infection locally or  systemically which is excellent. With that being said on the right leg what wound is measuring smaller the other 1 is [name] new wound that just showed up fortunately its not too bad. Has been using Xeroform here and that seems to be doing decently well which is great news. Unfortunately his blood pressure is significantly high we gave him the readings for the past 4-5 visits as well as [name] recommendation to make an appointment to go discuss  this with his primary care provider patient states that he is going to look into doing this. 01-30-2022 upon evaluation today patient appears to be doing well with regard to his left leg everything appears to be healed. On the right leg the more anterior wound is healed the more medial wound that I been concerned about [name] possible skin cancer unfortunately still does not look great to me. I do believe that we should probably do [name] biopsy I have talked about it with him [name] few times I think though it is probably time to go ahead and do this at this point. 02-09-2022 upon evaluation today patient appears to be doing well with regard to his legs. On the left this appears to be completely healed. On the right he does have 2 areas and be perfectly honest one of them is [name] skin cancer that he is going to the [facility] for the other seems to be healing nicely. Readmission: 08-02-2022 upon evaluation today patient appears for reevaluation here in our clinic concerning issues that he has been having with wounds over the bilateral lower extremities. I last saw him in May 2023 and at that point we had him completely healed. Unfortunately he is tells me this has broken down to some degree since that point. 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 He tells me this 1 seems to just be getting bigger and bigger not improving. Fortunately he is not having any significant pain which is good news he does have quite [name] bit of swelling and he tells me that his fluid pills are not recommended for him to take daily but just in 3-day intervals here and there. Patient History Information obtained from Patient. Allergies No Known Allergies Family History Cancer - Father, Diabetes - Paternal Grandparents, Heart Disease -  Father, Hypertension - Mother,Father,Siblings, No family history of Hereditary Spherocytosis, Kidney Disease, Lung Disease, Seizures, Stroke, Thyroid Problems, Tuberculosis. Social History Never smoker, Marital Status - Divorced, Alcohol Use - Rarely, Drug Use - No History, Caffeine Use - Never. Medical History Cardiovascular Patient has history of Arrhythmia - [name]-fib, Hypertension Denies history of Angina Endocrine Denies history of Type II Diabetes Musculoskeletal Patient has history of Gout Medical [name] Surgical History Notes nd Endocrine pt states never told he was [name] diabetic [name], [name] ([id number]) [id number] Page 9 of 12 Objective Constitutional Well-nourished and well-hydrated in no acute distress. Vitals Time Taken: 1:13 PM, Height: 75 in, Source: Stated, Weight: 225 lbs, Source: Stated, BMI: 28.1, Temperature: 98.0 F, Pulse: 50 bpm, Respiratory Rate: 18 breaths/min, Blood Pressure: 180/86 mmHg. Respiratory normal breathing without difficulty. Psychiatric this patient is able to make decisions and demonstrates good insight into disease process. Alert and Oriented x 3. pleasant and cooperative. General Notes: Upon inspection patient's wound bed actually showed signs  of good granulation and epithelization at this point. Fortunately I do not see any evidence of infection locally or systemically which is great news and overall I am extremely pleased with where we stand today. I did not perform any debridement though I did take [name] biopsy from the left lateral leg at the largest wound site that I [place] send for pathology. Integumentary (Hair, Skin) Wound #10 status is Open. Original cause of wound was Gradually Appeared. The date acquired was: 01/20/2022. The wound is located on the Left,Medial Lower Leg. The wound measures 1.3cm length x 1.5cm width x 0.2cm depth; 1.532cm^2 area and 0.306cm^3 volume. There is Fat Layer (Subcutaneous Tissue) exposed.  There is no tunneling or undermining noted. There is [name] medium amount of serous drainage noted. There is small (1-33%) red, pink granulation within the wound bed. There is no necrotic tissue within the wound bed. Wound #11 status is Open. Original cause of wound was Gradually Appeared. The date acquired was: 01/20/2022. The wound is located on the Left,Posterior Lower Leg. The wound measures 0.7cm length x 0.7cm width x 0.1cm depth; 0.385cm^2 area and 0.038cm^3 volume. There is Fat Layer (Subcutaneous Tissue) exposed. There is [name] medium amount of serous drainage noted. The wound margin is distinct with the outline attached to the wound base. There is small (1-33%) red granulation within the wound bed. There is no necrotic tissue within the wound bed. Wound #12 status is Open. Original cause of wound was Gradually Appeared. The date acquired was: 01/22/2022. The wound is located on the Right,Medial Lower Leg. The wound measures 1.9cm length x 1.2cm width x 0.1cm depth; 1.791cm^2 area and 0.179cm^3 volume. There is no tunneling or undermining noted. There is [name] large amount of serous drainage noted. There is small (1-33%) red granulation within the wound bed. There is [name] large (67-100%) amount of necrotic tissue within the wound bed. Wound #13 status is Open. Original cause of wound was Gradually Appeared. The date acquired was: 01/22/2022. The wound is located on the Right,Midline Lower Leg. The wound measures 1cm length x 0.8cm width x 0.1cm depth; 0.628cm^2 area and 0.063cm^3 volume. There is Fat Layer (Subcutaneous Tissue) exposed. There is no tunneling or undermining noted. There is [name] large amount of serous drainage noted. There is small (1-33%) red granulation within the wound bed. There is [name] large (67-100%) amount of necrotic tissue within the wound bed including Adherent Slough. Wound #14 status is Open. Original cause of wound was Gradually Appeared. The date acquired was: 01/20/2022. The wound is  located on the Right,Lateral Lower Leg. The wound measures 0.8cm length x 0.8cm width x 0.2cm depth; 0.503cm^2 area and 0.101cm^3 volume. There is Fat Layer (Subcutaneous Tissue) exposed. There is [name] medium amount of serous drainage noted. There is small (1-33%) red granulation within the wound bed. Wound #9 status is Open. Original cause of wound was Gradually Appeared. The date acquired was: 01/20/2022. The wound is located on the Left,Lateral Lower Leg. The wound measures 5.2cm length x 4.3cm width x 0.2cm depth; 17.562cm^2 area and 3.512cm^3 volume. There is Fat Layer (Subcutaneous Tissue) exposed. There is [name] large amount of serosanguineous drainage noted. The wound margin is thickened. There is small (1-33%) red, hyper - granulation within the wound bed. There is no necrotic tissue within the wound bed. 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 There was [name] biopsy performed by Tommie Sams., PA-C. There was [name] biopsy performed on Wound Margin. The skin was cleansed and prepped with anti-septic followed by pain control using Lidocaine Injectable: 2%. Utilizing [name] 3 mm tissue punch, tissue was removed at its base with the following instrument(s): Forceps and Scissors and sent to pathology. [name] time out was conducted at 14:15, prior to the start of the procedure. The procedure was tolerated well. Post procedure Diagnosis Wound #9: Same as Pre-Procedure General Notes: 2 silver nitrate sticks used.Marland Kitchen [name], [name]  ([id number]) [id number] Page 10 of 12 Plan Follow-up Appointments: Return Appointment in 1 week. Nurse Visit as needed Bathing/ Shower/ Hygiene: May shower with wound dressing protected with water repellent cover or cast protector. No tub bath. Anesthetic (Use 'Patient Medications' Section for Anesthetic Order Entry): Wound #9 Left,Lateral Lower Leg: Injectable lidocaine applied before procedure. Edema Control - Lymphedema / Segmental Compressive Device / Other: Elevate, Exercise Daily and Avoid Standing for Long Periods of Time. Elevate legs to the level of the heart and pump ankles as often as possible Elevate leg(s) parallel to the floor when sitting. DO YOUR BEST to sleep in the bed at night. DO NOT sleep in your recliner. Long hours of sitting in [name] recliner leads to swelling of the legs and/or potential wounds on your backside. Additional Orders / Instructions: Follow Nutritious Diet and Increase Protein Intake WOUND #10: - Lower Leg Wound Laterality: Left, Medial Cleanser: Soap and Water 1 x Per Week/15 Days Discharge Instructions: Gently cleanse wound with antibacterial soap, rinse and pat dry prior to dressing wounds Peri-Wound Care: Moisturizing Lotion 1 x Per Week/15 Days Discharge Instructions: Suggestions: Theraderm, Eucerin, Cetaphil, or patient preference. Prim Dressing: Silvercel Small 2x2 (in/in) (Generic) 1 x Per Week/15 Days ary Discharge Instructions: Apply Silvercel Small 2x2 (in/in) as instructed Secondary Dressing: ABD Pad 5x9 (in/in) 1 x Per Week/15 Days Discharge Instructions: Cover with ABD pad Com pression Wrap: 3-LAYER WRAP - Profore Lite LF 3 Multilayer Compression Bandaging System 1 x Per Week/15 Days Discharge Instructions: Apply 3 multi-layer wrap as prescribed. WOUND #11: - Lower Leg Wound Laterality: Left, Posterior Cleanser: Soap and Water 1 x Per Week/15 Days Discharge Instructions: Gently cleanse wound with antibacterial  soap, rinse and pat dry prior to dressing wounds Peri-Wound Care: Moisturizing Lotion 1 x Per Week/15 Days Discharge Instructions: Suggestions: Theraderm, Eucerin, Cetaphil, or patient preference. Prim Dressing: Silvercel Small 2x2 (in/in) (Generic) 1 x Per Week/15 Days ary Discharge Instructions: Apply Silvercel Small 2x2 (in/in) as instructed Secondary Dressing: ABD Pad 5x9 (in/in) 1 x Per Week/15 Days Discharge Instructions: Cover with ABD pad Com pression Wrap: 3-LAYER WRAP - Profore Lite LF 3 Multilayer Compression Bandaging System 1 x Per Week/15 Days Discharge Instructions: Apply 3 multi-layer wrap as prescribed. WOUND #12: - Lower Leg Wound Laterality: Right, Medial Cleanser: Soap and Water 1 x Per Week/15 Days Discharge Instructions: Gently cleanse wound with antibacterial soap, rinse and pat dry prior to dressing wounds Peri-Wound Care: Moisturizing Lotion 1 x Per Week/15 Days Discharge Instructions: Suggestions: Theraderm, Eucerin, Cetaphil, or patient preference. Prim Dressing: Silvercel Small 2x2 (in/in) (Generic) 1 x Per Week/15 Days ary Discharge Instructions: Apply Silvercel Small 2x2 (in/in) as instructed Secondary Dressing: ABD Pad 5x9 (  in/in) 1 x Per Week/15 Days Discharge Instructions: Cover with ABD pad Com pression Wrap: 3-LAYER WRAP - Profore Lite LF 3 Multilayer Compression Bandaging System 1 x Per Week/15 Days Discharge Instructions: Apply 3 multi-layer wrap as prescribed. WOUND #13: - Lower Leg Wound Laterality: Right, Midline Cleanser: Soap and Water 1 x Per Week/15 Days Discharge Instructions: Gently cleanse wound with antibacterial soap, rinse and pat dry prior to dressing wounds Peri-Wound Care: Moisturizing Lotion 1 x Per Week/15 Days Discharge Instructions: Suggestions: Theraderm, Eucerin, Cetaphil, or patient preference. Prim Dressing: Silvercel Small 2x2 (in/in) (Generic) 1 x Per Week/15 Days ary Discharge Instructions: Apply Silvercel Small 2x2 (in/in)  as instructed Secondary Dressing: ABD Pad 5x9 (in/in) 1 x Per Week/15 Days Discharge Instructions: Cover with ABD pad Com pression Wrap: 3-LAYER WRAP - Profore Lite LF 3 Multilayer Compression Bandaging System 1 x Per Week/15 Days Discharge Instructions: Apply 3 multi-layer wrap as prescribed. WOUND #14: - Lower Leg Wound Laterality: Right, Lateral Cleanser: Soap and Water 1 x Per Week/15 Days Discharge Instructions: Gently cleanse wound with antibacterial soap, rinse and pat dry prior to dressing wounds Peri-Wound Care: Moisturizing Lotion 1 x Per Week/15 Days Discharge Instructions: Suggestions: Theraderm, Eucerin, Cetaphil, or patient preference. Prim Dressing: Silvercel Small 2x2 (in/in) (Generic) 1 x Per Week/15 Days ary Discharge Instructions: Apply Silvercel Small 2x2 (in/in) as instructed Secondary Dressing: ABD Pad 5x9 (in/in) 1 x Per Week/15 Days Discharge Instructions: Cover with ABD pad Com pression Wrap: 3-LAYER WRAP - Profore Lite LF 3 Multilayer Compression Bandaging System 1 x Per Week/15 Days Discharge Instructions: Apply 3 multi-layer wrap as prescribed. WOUND #9: - Lower Leg Wound Laterality: Left, Lateral Cleanser: Soap and Water 1 x Per Week/15 Days Discharge Instructions: Gently cleanse wound with antibacterial soap, rinse and pat dry prior to dressing wounds Peri-Wound Care: Moisturizing Lotion 1 x Per Week/15 Days Discharge Instructions: Suggestions: Theraderm, Eucerin, Cetaphil, or patient preference. Prim Dressing: Silvercel Small 2x2 (in/in) (Generic) 1 x Per Week/15 Days ary Discharge Instructions: Apply Silvercel Small 2x2 (in/in) as instructed Secondary Dressing: Zetuvit Plus 4x8 (in/in) 1 x Per Week/15 Days Com pression Wrap: 3-LAYER WRAP - Profore Lite LF 3 Multilayer Compression Bandaging System 1 x Per Week/15 Days Discharge Instructions: Apply 3 multi-layer wrap as prescribed. AYINDE, SWIM [name] ([id number]) [id number] Page 11  of 12 1. I am going to recommend based on what I am seeing currently that we have the patient continue with similar treatment compared to what we had before. I Minna suggest [name] silver alginate dressing to all open areas. 2. Also can recommend that we have the patient use the ABD pads to cover most areas although the largest one will use some Zetuvit on the left leg. 3 we will get initiate [name] 3 layer compression wrap bilaterally. 4. I am also going to continue to monitor for any signs of worsening or infection obviously if anything changes he should let me know. 5. I am going to also evaluate the pathology when that returns we will make any adjustments in care as needed at that point. We will see patient back for reevaluation in 1 week here in the clinic. If anything worsens or changes patient will contact our office for additional recommendations. Electronic Signature(s) Signed: 08/02/2022 4:50:07 PM By: [name] PA-C Entered By: [name] on 08/02/2022 16:50:07 -------------------------------------------------------------------------------- ROS/PFSH Details Patient Name: Date of Service: [name] [place] [name] 08/02/2022 1:00 PM Medical Record Number: [id number] Patient Account Number: [id number] Date of Birth/Sex:  Treating RN: 11-10-1945 (76 y.o. [name] Primary Care Provider: [name] Other Clinician: Referring Provider: Treating Provider/Extender: York Spaniel in Treatment: 0 Information Obtained From Patient Cardiovascular Medical History: Positive for: Arrhythmia - [name]-fib; Hypertension Negative for: Angina Endocrine Medical History: Negative for: Type II Diabetes Past Medical History Notes: pt states never told he was [name] diabetic Musculoskeletal Medical History: Positive for: Gout Immunizations Pneumococcal Vaccine: Received Pneumococcal Vaccination: Yes Received Pneumococcal Vaccination On or After 60th Birthday: Yes Implantable  Devices None Family and Social History Cancer: Yes - Father; Diabetes: Yes - Paternal Grandparents; Heart Disease: Yes - Father; Hereditary Spherocytosis: No; Hypertension: Yes - Mother,Father,Siblings; Kidney Disease: No; Lung Disease: No; Seizures: No; Stroke: No; Thyroid Problems: No; Tuberculosis: No; Never smoker; Marital Status - Divorced; Alcohol Use: Rarely; Drug Use: No History; Caffeine Use: Never; Financial Concerns: No; Food, Clothing or Shelter Needs: No; Support System Lacking: No; Transportation Concerns: No Electronic Signature(s) BEXLEY, MCLESTER [name] ([id number]) [id number] Page 12 of 12 Signed: 08/02/2022 4:52:35 PM By: [name] MSN RN CNS WTA Signed: 08/02/2022 5:00:04 PM By: [name] PA-C Entered By: [name] on 08/02/2022 13:21:46 -------------------------------------------------------------------------------- SuperBill Details Patient Name: Date of Service: [name] [name], [name] 08/02/2022 Medical Record Number: [id number] Patient Account Number: [id number] Date of Birth/Sex: Treating RN: 12-Feb-1946 (76 y.o. 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 I73.89 Other specified peripheral vascular diseases I48.0 Paroxysmal atrial fibrillation I10 Essential (primary) hypertension Physician Procedures : CPT4 Code Description Modifier [id number] - WC PHYS LEVEL 3 - EST PT ICD-10 Diagnosis Description I87.333 Chronic venous hypertension (idiopathic) with ulcer and inflammation of  bilateral lower extremity L97.822 Non-pressure chronic ulcer of other  part of left lower leg with fat layer exposed L97.812 Non-pressure chronic ulcer of other part of right lower leg with fat layer exposed I73.89 Other specified peripheral vascular diseases Quantity: 1 Electronic Signature(s) Signed: 08/02/2022 4:50:27 PM By: [name] PA-C Entered By: [name] on 08/02/2022 16:50:26

## 2022-08-07 LAB — SURGICAL PATHOLOGY

## 2022-08-09 ENCOUNTER — Encounter: Payer: Medicare PPO | Attending: Physician Assistant | Admitting: Physician Assistant

## 2022-08-09 ENCOUNTER — Encounter (HOSPITAL_COMMUNITY): Payer: Self-pay | Admitting: Emergency Medicine

## 2022-08-09 ENCOUNTER — Other Ambulatory Visit: Payer: Self-pay

## 2022-08-09 ENCOUNTER — Telehealth: Payer: Self-pay | Admitting: Cardiology

## 2022-08-09 ENCOUNTER — Emergency Department (HOSPITAL_COMMUNITY)
Admission: EM | Admit: 2022-08-09 | Discharge: 2022-08-10 | Disposition: A | Discharge status: Home / Self Care | Payer: Medicare PPO | Attending: Emergency Medicine | Admitting: Emergency Medicine

## 2022-08-09 ENCOUNTER — Ambulatory Visit: Payer: Medicare PPO | Admitting: Physician Assistant

## 2022-08-09 ENCOUNTER — Encounter: Payer: Self-pay | Admitting: Physician Assistant

## 2022-08-09 VITALS — BP 205/117 | HR 65 | Temp 98.3°F | Resp 18

## 2022-08-09 DIAGNOSIS — R2243 Localized swelling, mass and lump, lower limb, bilateral: Secondary | ICD-10-CM | POA: Insufficient documentation

## 2022-08-09 DIAGNOSIS — I10 Essential (primary) hypertension: Secondary | ICD-10-CM | POA: Insufficient documentation

## 2022-08-09 DIAGNOSIS — L97822 Non-pressure chronic ulcer of other part of left lower leg with fat layer exposed: Secondary | ICD-10-CM | POA: Diagnosis not present

## 2022-08-09 DIAGNOSIS — R918 Other nonspecific abnormal finding of lung field: Secondary | ICD-10-CM

## 2022-08-09 DIAGNOSIS — R059 Cough, unspecified: Secondary | ICD-10-CM | POA: Diagnosis not present

## 2022-08-09 DIAGNOSIS — Z85828 Personal history of other malignant neoplasm of skin: Secondary | ICD-10-CM | POA: Diagnosis not present

## 2022-08-09 DIAGNOSIS — I4821 Permanent atrial fibrillation: Secondary | ICD-10-CM

## 2022-08-09 DIAGNOSIS — I872 Venous insufficiency (chronic) (peripheral): Secondary | ICD-10-CM

## 2022-08-09 DIAGNOSIS — Z7901 Long term (current) use of anticoagulants: Secondary | ICD-10-CM | POA: Diagnosis not present

## 2022-08-09 DIAGNOSIS — I739 Peripheral vascular disease, unspecified: Secondary | ICD-10-CM

## 2022-08-09 DIAGNOSIS — D6869 Other thrombophilia: Secondary | ICD-10-CM | POA: Diagnosis not present

## 2022-08-09 DIAGNOSIS — L97812 Non-pressure chronic ulcer of other part of right lower leg with fat layer exposed: Secondary | ICD-10-CM | POA: Insufficient documentation

## 2022-08-09 DIAGNOSIS — M7989 Other specified soft tissue disorders: Secondary | ICD-10-CM

## 2022-08-09 DIAGNOSIS — R7989 Other specified abnormal findings of blood chemistry: Secondary | ICD-10-CM | POA: Diagnosis not present

## 2022-08-09 DIAGNOSIS — I87333 Chronic venous hypertension (idiopathic) with ulcer and inflammation of bilateral lower extremity: Secondary | ICD-10-CM | POA: Diagnosis not present

## 2022-08-09 DIAGNOSIS — R600 Localized edema: Secondary | ICD-10-CM | POA: Insufficient documentation

## 2022-08-09 DIAGNOSIS — R053 Chronic cough: Secondary | ICD-10-CM

## 2022-08-09 DIAGNOSIS — R0602 Shortness of breath: Secondary | ICD-10-CM

## 2022-08-09 DIAGNOSIS — I480 Paroxysmal atrial fibrillation: Secondary | ICD-10-CM | POA: Insufficient documentation

## 2022-08-09 DIAGNOSIS — R739 Hyperglycemia, unspecified: Secondary | ICD-10-CM

## 2022-08-09 DIAGNOSIS — I7389 Other specified peripheral vascular diseases: Secondary | ICD-10-CM | POA: Diagnosis not present

## 2022-08-09 DIAGNOSIS — Z79899 Other long term (current) drug therapy: Secondary | ICD-10-CM | POA: Diagnosis not present

## 2022-08-09 LAB — CBC WITH DIFFERENTIAL/PLATELET
Abs Immature Granulocytes: 0.01 10*3/uL (ref 0.00–0.07)
Basophils Absolute: 0.1 10*3/uL (ref 0.0–0.1)
Basophils Relative: 1 %
Eosinophils Absolute: 0.3 10*3/uL (ref 0.0–0.5)
Eosinophils Relative: 3 %
HCT: 39.3 % (ref 39.0–52.0)
Hemoglobin: 13 g/dL (ref 13.0–17.0)
Immature Granulocytes: 0 %
Lymphocytes Relative: 16 %
Lymphs Abs: 1.3 10*3/uL (ref 0.7–4.0)
MCH: 31.9 pg (ref 26.0–34.0)
MCHC: 33.1 g/dL (ref 30.0–36.0)
MCV: 96.6 fL (ref 80.0–100.0)
Monocytes Absolute: 0.9 10*3/uL (ref 0.1–1.0)
Monocytes Relative: 11 %
Neutro Abs: 5.8 10*3/uL (ref 1.7–7.7)
Neutrophils Relative %: 69 %
Platelets: 285 10*3/uL (ref 150–400)
RBC: 4.07 MIL/uL — ABNORMAL LOW (ref 4.22–5.81)
RDW: 13.6 % (ref 11.5–15.5)
WBC: 8.3 10*3/uL (ref 4.0–10.5)
nRBC: 0 % (ref 0.0–0.2)

## 2022-08-09 LAB — BASIC METABOLIC PANEL
Anion gap: 10 (ref 5–15)
BUN: 7 mg/dL — ABNORMAL LOW (ref 8–23)
CO2: 27 mmol/L (ref 22–32)
Calcium: 9.6 mg/dL (ref 8.9–10.3)
Chloride: 102 mmol/L (ref 98–111)
Creatinine, Ser: 0.89 mg/dL (ref 0.61–1.24)
GFR, Estimated: >60 mL/min (ref >60)
Glucose, Bld: 116 mg/dL — ABNORMAL HIGH (ref 70–99)
Potassium: 3.5 mmol/L (ref 3.5–5.1)
Sodium: 139 mmol/L (ref 135–145)

## 2022-08-09 NOTE — Progress Notes (Addendum)
[name] ([id number]) [id number] Page 1 of 15 Visit Report for 08/09/2022 Arrival Information Details Patient Name: Date of Service: [name] 08/09/2022 11:30 A M Medical Record Number: [id number] Patient Account Number: [id number] Date of Birth/Sex: Treating RN: [birth year] (76 y.o. [name] Primary Care Meliya Mcconahy: [name] Other Clinician: Referring Jezebelle Ledwell: Treating Arrow Emmerich/Extender: [name] in Treatment: 1 Visit Information History Since Last Visit Added or deleted any medications: No Patient Arrived: Ambulatory Had a fall or experienced change in No Arrival Time: 11:55 activities of daily living that may affect Accompanied By: sister risk of falls: Transfer Assistance: None Hospitalized since last visit: No Patient Identification Verified: Yes Pain Present Now: No Secondary Verification Process Completed: Yes Patient Requires Transmission-Based No Precautions: Patient Has Alerts: Yes Patient Alerts: Patient on Blood Thinner 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 08/09/2022 11:30 A M Medical Record Number: [id number] Patient Account Number: [id number] Date of Birth/Sex: Treating RN: 07-01-1946 (76 y.o. [name] Primary Care Kameka Whan: [name] Other Clinician: Referring Blade Scheff: Treating Jaci Desanto/Extender: [name] in Treatment: 1 Clinic Level of Care Assessment Items TOOL 4 Quantity Score X- 1 0 Use when only an EandM is performed on FOLLOW-UP visit ASSESSMENTS - Nursing Assessment / Reassessment X- 1  10 Reassessment of Co-morbidities (includes updates in patient status) X- 1 5 Reassessment of Adherence to Treatment Plan ALHASSAN, EVERINGHAM ([id number]) [id number] Page 2 of 15 ASSESSMENTS - Wound and Skin A ssessment / Reassessment '[]'$  - 0 Simple Wound Assessment / Reassessment - one wound X- 6 5 Complex Wound Assessment / Reassessment - multiple wounds '[]'$  - 0 Dermatologic / Skin Assessment (not related to wound area) ASSESSMENTS - Focused Assessment '[]'$  - 0 Circumferential Edema Measurements - multi extremities '[]'$  - 0 Nutritional Assessment / Counseling / Intervention '[]'$  - 0 Lower Extremity Assessment (monofilament, tuning fork, pulses) '[]'$  - 0 Peripheral Arterial Disease Assessment (using hand held doppler) ASSESSMENTS - Ostomy and/or Continence Assessment and Care '[]'$  - 0 Incontinence Assessment and Management '[]'$  - 0 Ostomy Care Assessment and Management (repouching, etc.) PROCESS - Coordination of Care '[]'$  - 0 Simple Patient / Family Education for ongoing care X- 1 20 Complex (extensive) Patient / Family Education for ongoing care X- 1 10 Staff obtains Programmer, systems, Records, T Results / Process Orders est '[]'$  - 0 Staff telephones HHA, Nursing Homes / Clarify orders / etc '[]'$  - 0 Routine Transfer to another Facility (non-emergent condition) '[]'$  - 0 Routine Hospital Admission (non-emergent condition) '[]'$  - 0 New Admissions / Biomedical engineer / Ordering NPWT Apligraf, etc. , '[]'$  - 0 Emergency Hospital Admission (emergent condition) '[]'$  - 0 Simple Discharge Coordination X- 1 15 Complex (extensive) Discharge Coordination PROCESS - Special Needs '[]'$  - 0 Pediatric / Minor Patient Management '[]'$  - 0 Isolation Patient Management '[]'$  - 0 Hearing / Language / Visual special needs '[]'$  - 0 Assessment of Community assistance (transportation, [name], etc.) '[]'$  - 0 Additional assistance / Altered mentation '[]'$  - 0 Support Surface(s) Assessment (bed,  cushion, seat, etc.) INTERVENTIONS - Wound Cleansing / Measurement '[]'$  - 0 Simple Wound Cleansing - one wound X- 6 5 Complex Wound Cleansing - multiple wounds '[]'$  - 0 Wound Imaging (photographs -  [name] ([id number]) [id number] Page 1 of 15 Visit Report for 08/09/2022 Arrival Information Details Patient Name: Date of Service: [name] 08/09/2022 11:30 A M Medical Record Number: [id number] Patient Account Number: [id number] Date of Birth/Sex: Treating RN: [birth year] (76 y.o. [name] Primary Care Meliya Mcconahy: [name] Other Clinician: Referring Jezebelle Ledwell: Treating Arrow Emmerich/Extender: [name] in Treatment: 1 Visit Information History Since Last Visit Added or deleted any medications: No Patient Arrived: Ambulatory Had a fall or experienced change in No Arrival Time: 11:55 activities of daily living that may affect Accompanied By: sister risk of falls: Transfer Assistance: None Hospitalized since last visit: No Patient Identification Verified: Yes Pain Present Now: No Secondary Verification Process Completed: Yes Patient Requires Transmission-Based No Precautions: Patient Has Alerts: Yes Patient Alerts: Patient on Blood Thinner 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 08/09/2022 11:30 A M Medical Record Number: [id number] Patient Account Number: [id number] Date of Birth/Sex: Treating RN: 07-01-1946 (76 y.o. [name] Primary Care Kameka Whan: [name] Other Clinician: Referring Blade Scheff: Treating Jaci Desanto/Extender: [name] in Treatment: 1 Clinic Level of Care Assessment Items TOOL 4 Quantity Score X- 1 0 Use when only an EandM is performed on FOLLOW-UP visit ASSESSMENTS - Nursing Assessment / Reassessment X- 1  10 Reassessment of Co-morbidities (includes updates in patient status) X- 1 5 Reassessment of Adherence to Treatment Plan ALHASSAN, EVERINGHAM ([id number]) [id number] Page 2 of 15 ASSESSMENTS - Wound and Skin A ssessment / Reassessment '[]'$  - 0 Simple Wound Assessment / Reassessment - one wound X- 6 5 Complex Wound Assessment / Reassessment - multiple wounds '[]'$  - 0 Dermatologic / Skin Assessment (not related to wound area) ASSESSMENTS - Focused Assessment '[]'$  - 0 Circumferential Edema Measurements - multi extremities '[]'$  - 0 Nutritional Assessment / Counseling / Intervention '[]'$  - 0 Lower Extremity Assessment (monofilament, tuning fork, pulses) '[]'$  - 0 Peripheral Arterial Disease Assessment (using hand held doppler) ASSESSMENTS - Ostomy and/or Continence Assessment and Care '[]'$  - 0 Incontinence Assessment and Management '[]'$  - 0 Ostomy Care Assessment and Management (repouching, etc.) PROCESS - Coordination of Care '[]'$  - 0 Simple Patient / Family Education for ongoing care X- 1 20 Complex (extensive) Patient / Family Education for ongoing care X- 1 10 Staff obtains Programmer, systems, Records, T Results / Process Orders est '[]'$  - 0 Staff telephones HHA, Nursing Homes / Clarify orders / etc '[]'$  - 0 Routine Transfer to another Facility (non-emergent condition) '[]'$  - 0 Routine Hospital Admission (non-emergent condition) '[]'$  - 0 New Admissions / Biomedical engineer / Ordering NPWT Apligraf, etc. , '[]'$  - 0 Emergency Hospital Admission (emergent condition) '[]'$  - 0 Simple Discharge Coordination X- 1 15 Complex (extensive) Discharge Coordination PROCESS - Special Needs '[]'$  - 0 Pediatric / Minor Patient Management '[]'$  - 0 Isolation Patient Management '[]'$  - 0 Hearing / Language / Visual special needs '[]'$  - 0 Assessment of Community assistance (transportation, [name], etc.) '[]'$  - 0 Additional assistance / Altered mentation '[]'$  - 0 Support Surface(s) Assessment (bed,  cushion, seat, etc.) INTERVENTIONS - Wound Cleansing / Measurement '[]'$  - 0 Simple Wound Cleansing - one wound X- 6 5 Complex Wound Cleansing - multiple wounds '[]'$  - 0 Wound Imaging (photographs -  Weeks in Treatment: 1 Education Assessment Education Provided To: Patient and Caregiver Education Topics Provided Wound/Skin Impairment: Handouts: Caring for Your Ulcer Methods: Explain/Verbal Responses: State content correctly Electronic Signature(s) Signed: 08/13/2022 2:21:51 PM By: [name] MSN RN CNS WTA Entered By: [name] on 08/09/2022 12:51:58 -------------------------------------------------------------------------------- Wound Assessment Details Patient Name: Date of Service: [name] [name], [name] 08/09/2022 11:30 A M Medical Record Number: [id number] Patient Account Number: [id number] Date of Birth/Sex: Treating RN: [birth year] (76 y.o. [name] Primary Care  Jordyne Poehlman: [name] Other Clinician: Referring Devery Odwyer: Treating Bryton Waight/Extender: [name] in Treatment: 1 Wound Status Wound Number: 10 Primary Etiology: Venous Leg Ulcer Wound Location: Left, Medial Lower Leg Wound Status: Open Wounding Event: Gradually Appeared Date Acquired: 01/20/2022 Weeks Of Treatment: 1 Clustered Wound: No Photos BRAIN, HONEYCUTT [name] ([id number]) [id number] Page 8 of 15 Photo Uploaded By: [name] on 08/09/2022 12:13:49 Wound Measurements Length: (cm) 1 Width: (cm) 1.6 Depth: (cm) 0.2 Area: (cm) 1.257 Volume: (cm) 0.251 % Reduction in Area: 18% % Reduction in Volume: 18% Wound Description Classification: Full Thickness Without Exposed Support Exudate Amount: Medium Exudate Type: Serous Exudate Color: amber Structures Electronic Signature(s) Signed: 08/13/2022 2:21:51 PM By: [name] MSN RN CNS WTA Entered By: [name] on 08/09/2022 12:10:02 -------------------------------------------------------------------------------- Wound Assessment Details Patient Name: Date of Service: [name] [name], [name] 08/09/2022 11:30 A M Medical Record Number: [id number] Patient Account Number: [id number] Date of Birth/Sex: Treating RN: 22-Feb-1946 (76 y.o. [name] Primary Care Vassie Kugel: [name] Other Clinician: Referring Airabella Barley: Treating Zebulin Siegel/Extender: [name] in Treatment: 1 Wound Status Wound Number: 11 Primary Etiology: Venous Leg Ulcer Wound Location: Left, Posterior Lower Leg Wound Status: Open Wounding Event: Gradually Appeared Comorbid History: Arrhythmia, Hypertension, Gout Date Acquired: 01/20/2022 Weeks Of Treatment: 1 Clustered Wound: No Photos FREDRIC, SLABACH [name] ([id number]) [id number] Page 9 of 15 Wound Measurements Length: (cm) 0.7 Width: (cm) 0.7 Depth: (cm) 0.1 Area: (cm) 0.385 Volume: (cm) 0.038 %  Reduction in Area: 0% % Reduction in Volume: 0% Epithelialization: Small (1-33%) Wound Description Classification: Full Thickness Without Exposed Support Structures Wound Margin: Distinct, outline attached Exudate Amount: Medium Exudate Type: Serous Exudate Color: amber Foul Odor After Cleansing: No Slough/Fibrino No Wound Bed Granulation Amount: Small (1-33%) Exposed Structure Granulation Quality: Red Fascia Exposed: No Necrotic Amount: None Present (0%) Fat Layer (Subcutaneous Tissue) Exposed: Yes Tendon Exposed: No Muscle Exposed: No Joint Exposed: No Bone Exposed: No Treatment Notes Wound #11 (Lower Leg) Wound Laterality: Left, Posterior Cleanser Soap and Water Discharge Instruction: Gently cleanse wound with antibacterial soap, rinse and pat dry prior to dressing wounds Peri-Wound Care Moisturizing Lotion Discharge Instruction: Suggestions: Theraderm, Eucerin, Cetaphil, or patient preference. Topical Primary Dressing Silvercel Small 2x2 (in/in) Discharge Instruction: Apply Silvercel Small 2x2 (in/in) as instructed Secondary Dressing ABD Pad 5x9 (in/in) Discharge Instruction: Cover with ABD pad Secured With Compression Wrap 3-LAYER WRAP - Profore Lite LF 3 Multilayer Compression Bandaging System Discharge Instruction: Apply 3 multi-layer wrap as prescribed. Compression Stockings Add-Ons Electronic Signature(s) Signed: 08/13/2022 2:21:51 PM By: [name] MSN RN CNS WTA Entered By: [name] on 08/09/2022 12:15:41 KAIDENCE, CALLAWAY [name] ([id number]) [id number] Page 10 of 15 -------------------------------------------------------------------------------- Wound Assessment Details Patient Name: Date of Service: [name] 08/09/2022 11:30 A M Medical Record Number: [id number] Patient Account Number: [id number] Date of Birth/Sex: Treating RN: [birth year] (76 y.o. [name] Primary Care Jahne Krukowski: [name] Other  Clinician: Referring Zaim Nitta: Treating  [name] ([id number]) [id number] Page 1 of 15 Visit Report for 08/09/2022 Arrival Information Details Patient Name: Date of Service: [name] 08/09/2022 11:30 A M Medical Record Number: [id number] Patient Account Number: [id number] Date of Birth/Sex: Treating RN: [birth year] (76 y.o. [name] Primary Care Meliya Mcconahy: [name] Other Clinician: Referring Jezebelle Ledwell: Treating Arrow Emmerich/Extender: [name] in Treatment: 1 Visit Information History Since Last Visit Added or deleted any medications: No Patient Arrived: Ambulatory Had a fall or experienced change in No Arrival Time: 11:55 activities of daily living that may affect Accompanied By: sister risk of falls: Transfer Assistance: None Hospitalized since last visit: No Patient Identification Verified: Yes Pain Present Now: No Secondary Verification Process Completed: Yes Patient Requires Transmission-Based No Precautions: Patient Has Alerts: Yes Patient Alerts: Patient on Blood Thinner 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 08/09/2022 11:30 A M Medical Record Number: [id number] Patient Account Number: [id number] Date of Birth/Sex: Treating RN: 07-01-1946 (76 y.o. [name] Primary Care Kameka Whan: [name] Other Clinician: Referring Blade Scheff: Treating Jaci Desanto/Extender: [name] in Treatment: 1 Clinic Level of Care Assessment Items TOOL 4 Quantity Score X- 1 0 Use when only an EandM is performed on FOLLOW-UP visit ASSESSMENTS - Nursing Assessment / Reassessment X- 1  10 Reassessment of Co-morbidities (includes updates in patient status) X- 1 5 Reassessment of Adherence to Treatment Plan ALHASSAN, EVERINGHAM ([id number]) [id number] Page 2 of 15 ASSESSMENTS - Wound and Skin A ssessment / Reassessment '[]'$  - 0 Simple Wound Assessment / Reassessment - one wound X- 6 5 Complex Wound Assessment / Reassessment - multiple wounds '[]'$  - 0 Dermatologic / Skin Assessment (not related to wound area) ASSESSMENTS - Focused Assessment '[]'$  - 0 Circumferential Edema Measurements - multi extremities '[]'$  - 0 Nutritional Assessment / Counseling / Intervention '[]'$  - 0 Lower Extremity Assessment (monofilament, tuning fork, pulses) '[]'$  - 0 Peripheral Arterial Disease Assessment (using hand held doppler) ASSESSMENTS - Ostomy and/or Continence Assessment and Care '[]'$  - 0 Incontinence Assessment and Management '[]'$  - 0 Ostomy Care Assessment and Management (repouching, etc.) PROCESS - Coordination of Care '[]'$  - 0 Simple Patient / Family Education for ongoing care X- 1 20 Complex (extensive) Patient / Family Education for ongoing care X- 1 10 Staff obtains Programmer, systems, Records, T Results / Process Orders est '[]'$  - 0 Staff telephones HHA, Nursing Homes / Clarify orders / etc '[]'$  - 0 Routine Transfer to another Facility (non-emergent condition) '[]'$  - 0 Routine Hospital Admission (non-emergent condition) '[]'$  - 0 New Admissions / Biomedical engineer / Ordering NPWT Apligraf, etc. , '[]'$  - 0 Emergency Hospital Admission (emergent condition) '[]'$  - 0 Simple Discharge Coordination X- 1 15 Complex (extensive) Discharge Coordination PROCESS - Special Needs '[]'$  - 0 Pediatric / Minor Patient Management '[]'$  - 0 Isolation Patient Management '[]'$  - 0 Hearing / Language / Visual special needs '[]'$  - 0 Assessment of Community assistance (transportation, [name], etc.) '[]'$  - 0 Additional assistance / Altered mentation '[]'$  - 0 Support Surface(s) Assessment (bed,  cushion, seat, etc.) INTERVENTIONS - Wound Cleansing / Measurement '[]'$  - 0 Simple Wound Cleansing - one wound X- 6 5 Complex Wound Cleansing - multiple wounds '[]'$  - 0 Wound Imaging (photographs -  [name] ([id number]) [id number] Page 1 of 15 Visit Report for 08/09/2022 Arrival Information Details Patient Name: Date of Service: [name] 08/09/2022 11:30 A M Medical Record Number: [id number] Patient Account Number: [id number] Date of Birth/Sex: Treating RN: [birth year] (76 y.o. [name] Primary Care Meliya Mcconahy: [name] Other Clinician: Referring Jezebelle Ledwell: Treating Arrow Emmerich/Extender: [name] in Treatment: 1 Visit Information History Since Last Visit Added or deleted any medications: No Patient Arrived: Ambulatory Had a fall or experienced change in No Arrival Time: 11:55 activities of daily living that may affect Accompanied By: sister risk of falls: Transfer Assistance: None Hospitalized since last visit: No Patient Identification Verified: Yes Pain Present Now: No Secondary Verification Process Completed: Yes Patient Requires Transmission-Based No Precautions: Patient Has Alerts: Yes Patient Alerts: Patient on Blood Thinner 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 08/09/2022 11:30 A M Medical Record Number: [id number] Patient Account Number: [id number] Date of Birth/Sex: Treating RN: 07-01-1946 (76 y.o. [name] Primary Care Kameka Whan: [name] Other Clinician: Referring Blade Scheff: Treating Jaci Desanto/Extender: [name] in Treatment: 1 Clinic Level of Care Assessment Items TOOL 4 Quantity Score X- 1 0 Use when only an EandM is performed on FOLLOW-UP visit ASSESSMENTS - Nursing Assessment / Reassessment X- 1  10 Reassessment of Co-morbidities (includes updates in patient status) X- 1 5 Reassessment of Adherence to Treatment Plan ALHASSAN, EVERINGHAM ([id number]) [id number] Page 2 of 15 ASSESSMENTS - Wound and Skin A ssessment / Reassessment '[]'$  - 0 Simple Wound Assessment / Reassessment - one wound X- 6 5 Complex Wound Assessment / Reassessment - multiple wounds '[]'$  - 0 Dermatologic / Skin Assessment (not related to wound area) ASSESSMENTS - Focused Assessment '[]'$  - 0 Circumferential Edema Measurements - multi extremities '[]'$  - 0 Nutritional Assessment / Counseling / Intervention '[]'$  - 0 Lower Extremity Assessment (monofilament, tuning fork, pulses) '[]'$  - 0 Peripheral Arterial Disease Assessment (using hand held doppler) ASSESSMENTS - Ostomy and/or Continence Assessment and Care '[]'$  - 0 Incontinence Assessment and Management '[]'$  - 0 Ostomy Care Assessment and Management (repouching, etc.) PROCESS - Coordination of Care '[]'$  - 0 Simple Patient / Family Education for ongoing care X- 1 20 Complex (extensive) Patient / Family Education for ongoing care X- 1 10 Staff obtains Programmer, systems, Records, T Results / Process Orders est '[]'$  - 0 Staff telephones HHA, Nursing Homes / Clarify orders / etc '[]'$  - 0 Routine Transfer to another Facility (non-emergent condition) '[]'$  - 0 Routine Hospital Admission (non-emergent condition) '[]'$  - 0 New Admissions / Biomedical engineer / Ordering NPWT Apligraf, etc. , '[]'$  - 0 Emergency Hospital Admission (emergent condition) '[]'$  - 0 Simple Discharge Coordination X- 1 15 Complex (extensive) Discharge Coordination PROCESS - Special Needs '[]'$  - 0 Pediatric / Minor Patient Management '[]'$  - 0 Isolation Patient Management '[]'$  - 0 Hearing / Language / Visual special needs '[]'$  - 0 Assessment of Community assistance (transportation, [name], etc.) '[]'$  - 0 Additional assistance / Altered mentation '[]'$  - 0 Support Surface(s) Assessment (bed,  cushion, seat, etc.) INTERVENTIONS - Wound Cleansing / Measurement '[]'$  - 0 Simple Wound Cleansing - one wound X- 6 5 Complex Wound Cleansing - multiple wounds '[]'$  - 0 Wound Imaging (photographs -  No Slough/Fibrino No [id number] Page 13 of 15 Wound Bed Granulation Amount: Medium (34-66%) Exposed Structure Granulation Quality: Red Fascia Exposed: No Necrotic Amount: Medium (34-66%) Fat Layer (Subcutaneous Tissue) Exposed: Yes Necrotic Quality: Adherent Slough Tendon Exposed: No Muscle Exposed: No Joint Exposed: No Bone Exposed: No Treatment Notes Wound #14 (Lower Leg) Wound Laterality: Right, Lateral Cleanser Soap and Water Discharge Instruction: Gently cleanse wound with antibacterial soap, rinse and pat dry prior to dressing wounds Peri-Wound Care Moisturizing Lotion Discharge Instruction: Suggestions: Theraderm, Eucerin, Cetaphil, or patient preference. Topical Primary Dressing Silvercel Small 2x2 (in/in) Discharge Instruction: Apply Silvercel Small 2x2 (in/in) as instructed Secondary Dressing ABD Pad 5x9 (in/in) Discharge Instruction: Cover with ABD pad Secured With Compression Wrap 3-LAYER WRAP - Profore Lite LF 3 Multilayer Compression Bandaging System Discharge Instruction: Apply 3 multi-layer wrap as prescribed. Compression Stockings Add-Ons Electronic Signature(s) Signed: 08/13/2022 2:21:51 PM By: [name] MSN RN CNS WTA Entered  By: [name] on 08/09/2022 12:16:31 -------------------------------------------------------------------------------- Wound Assessment Details Patient Name: Date of Service: [name] [name], [name] 08/09/2022 11:30 A M Medical Record Number: [id number] Patient Account Number: [id number] Date of Birth/Sex: Treating RN: 1945/10/23 (76 y.o. [name] Primary Care Woodrow Drab: [name] Other Clinician: Referring Hansford Hirt: Treating Jamaya Sleeth/Extender: [name] in Treatment: 1 Wound Status Wound Number: 9 Primary Etiology: Malignant Wound Wound Location: Left, Lateral Lower Leg Wound Status: Open Wounding Event: Gradually Appeared Comorbid History: Arrhythmia, Hypertension, Gout Date Acquired: 01/20/2022 [facility] Of Treatment: [place], [place] ([id number]) [id number] Page 14 of 15 Clustered Wound: No Photos Wound Measurements Length: (cm) 5.5 Width: (cm) 4.3 Depth: (cm) 0.1 Area: (cm) 18.575 Volume: (cm) 1.857 % Reduction in Area: -5.8% % Reduction in Volume: 47.1% Epithelialization: Small (1-33%) Wound Description Classification: Full Thickness Without Exposed Support Structures Wound Margin: Thickened Exudate Amount: Large Exudate Type: Serosanguineous Exudate Color: red, brown Foul Odor After Cleansing: No Slough/Fibrino Yes Wound Bed Granulation Amount: Medium (34-66%) Exposed Structure Granulation Quality: Red, Hyper-granulation Fascia Exposed: No Necrotic Amount: Medium (34-66%) Fat Layer (Subcutaneous Tissue) Exposed: Yes Necrotic Quality: Adherent Slough Tendon Exposed: No Muscle Exposed: No Joint Exposed: No Bone Exposed: No Treatment Notes Wound #9 (Lower Leg) Wound Laterality: Left, Lateral Cleanser Soap and Water Discharge Instruction: Gently cleanse wound with antibacterial soap, rinse and pat dry prior to dressing wounds Peri-Wound Care Moisturizing Lotion Discharge Instruction: Suggestions:  Theraderm, Eucerin, Cetaphil, or patient preference. Topical Primary Dressing Silvercel Small 2x2 (in/in) Discharge Instruction: Apply Silvercel Small 2x2 (in/in) as instructed Secondary Dressing Zetuvit Plus 4x8 (in/in) Secured With Compression Wrap 3-LAYER WRAP - Profore Lite LF 3 Multilayer Compression Bandaging System Discharge Instruction: Apply 3 multi-layer wrap as prescribed. Compression Stockings Add-Ons Electronic Signature(s) Signed: 08/13/2022 2:21:51 PM By: [name] MSN RN CNS WTA Entered By: [name] on 08/09/2022 12:16:55 ZEPH, RIEBEL [name] ([id number]) [id number] Page 15 of 15 -------------------------------------------------------------------------------- Vitals Details Patient Name: Date of Service: [name] 08/09/2022 11:30 A M Medical Record Number: [id number] Patient Account Number: [id number] Date of Birth/Sex: Treating RN: 12/26/1945 (76 y.o. [name] Primary Care Ena Demary: [name] Other Clinician: Referring Cathrine Krizan: Treating Wakisha Alberts/Extender: [name] in Treatment: 1 Vital Signs Time Taken: 11:56 Temperature (F): 97.9 Height (in): 75 Pulse (bpm): 58 Weight (lbs): 225 Respiratory Rate (breaths/min): 16 Body Mass Index (BMI): 28.1 Reference Range: 80 - 120 mg / dl Electronic Signature(s) Signed: 08/13/2022 2:21:51 PM By: [name] MSN RN CNS WTA Entered By: [name] on 08/09/2022  No Slough/Fibrino No [id number] Page 13 of 15 Wound Bed Granulation Amount: Medium (34-66%) Exposed Structure Granulation Quality: Red Fascia Exposed: No Necrotic Amount: Medium (34-66%) Fat Layer (Subcutaneous Tissue) Exposed: Yes Necrotic Quality: Adherent Slough Tendon Exposed: No Muscle Exposed: No Joint Exposed: No Bone Exposed: No Treatment Notes Wound #14 (Lower Leg) Wound Laterality: Right, Lateral Cleanser Soap and Water Discharge Instruction: Gently cleanse wound with antibacterial soap, rinse and pat dry prior to dressing wounds Peri-Wound Care Moisturizing Lotion Discharge Instruction: Suggestions: Theraderm, Eucerin, Cetaphil, or patient preference. Topical Primary Dressing Silvercel Small 2x2 (in/in) Discharge Instruction: Apply Silvercel Small 2x2 (in/in) as instructed Secondary Dressing ABD Pad 5x9 (in/in) Discharge Instruction: Cover with ABD pad Secured With Compression Wrap 3-LAYER WRAP - Profore Lite LF 3 Multilayer Compression Bandaging System Discharge Instruction: Apply 3 multi-layer wrap as prescribed. Compression Stockings Add-Ons Electronic Signature(s) Signed: 08/13/2022 2:21:51 PM By: [name] MSN RN CNS WTA Entered  By: [name] on 08/09/2022 12:16:31 -------------------------------------------------------------------------------- Wound Assessment Details Patient Name: Date of Service: [name] [name], [name] 08/09/2022 11:30 A M Medical Record Number: [id number] Patient Account Number: [id number] Date of Birth/Sex: Treating RN: 1945/10/23 (76 y.o. [name] Primary Care Woodrow Drab: [name] Other Clinician: Referring Hansford Hirt: Treating Jamaya Sleeth/Extender: [name] in Treatment: 1 Wound Status Wound Number: 9 Primary Etiology: Malignant Wound Wound Location: Left, Lateral Lower Leg Wound Status: Open Wounding Event: Gradually Appeared Comorbid History: Arrhythmia, Hypertension, Gout Date Acquired: 01/20/2022 [facility] Of Treatment: [place], [place] ([id number]) [id number] Page 14 of 15 Clustered Wound: No Photos Wound Measurements Length: (cm) 5.5 Width: (cm) 4.3 Depth: (cm) 0.1 Area: (cm) 18.575 Volume: (cm) 1.857 % Reduction in Area: -5.8% % Reduction in Volume: 47.1% Epithelialization: Small (1-33%) Wound Description Classification: Full Thickness Without Exposed Support Structures Wound Margin: Thickened Exudate Amount: Large Exudate Type: Serosanguineous Exudate Color: red, brown Foul Odor After Cleansing: No Slough/Fibrino Yes Wound Bed Granulation Amount: Medium (34-66%) Exposed Structure Granulation Quality: Red, Hyper-granulation Fascia Exposed: No Necrotic Amount: Medium (34-66%) Fat Layer (Subcutaneous Tissue) Exposed: Yes Necrotic Quality: Adherent Slough Tendon Exposed: No Muscle Exposed: No Joint Exposed: No Bone Exposed: No Treatment Notes Wound #9 (Lower Leg) Wound Laterality: Left, Lateral Cleanser Soap and Water Discharge Instruction: Gently cleanse wound with antibacterial soap, rinse and pat dry prior to dressing wounds Peri-Wound Care Moisturizing Lotion Discharge Instruction: Suggestions:  Theraderm, Eucerin, Cetaphil, or patient preference. Topical Primary Dressing Silvercel Small 2x2 (in/in) Discharge Instruction: Apply Silvercel Small 2x2 (in/in) as instructed Secondary Dressing Zetuvit Plus 4x8 (in/in) Secured With Compression Wrap 3-LAYER WRAP - Profore Lite LF 3 Multilayer Compression Bandaging System Discharge Instruction: Apply 3 multi-layer wrap as prescribed. Compression Stockings Add-Ons Electronic Signature(s) Signed: 08/13/2022 2:21:51 PM By: [name] MSN RN CNS WTA Entered By: [name] on 08/09/2022 12:16:55 ZEPH, RIEBEL [name] ([id number]) [id number] Page 15 of 15 -------------------------------------------------------------------------------- Vitals Details Patient Name: Date of Service: [name] 08/09/2022 11:30 A M Medical Record Number: [id number] Patient Account Number: [id number] Date of Birth/Sex: Treating RN: 12/26/1945 (76 y.o. [name] Primary Care Ena Demary: [name] Other Clinician: Referring Cathrine Krizan: Treating Wakisha Alberts/Extender: [name] in Treatment: 1 Vital Signs Time Taken: 11:56 Temperature (F): 97.9 Height (in): 75 Pulse (bpm): 58 Weight (lbs): 225 Respiratory Rate (breaths/min): 16 Body Mass Index (BMI): 28.1 Reference Range: 80 - 120 mg / dl Electronic Signature(s) Signed: 08/13/2022 2:21:51 PM By: [name] MSN RN CNS WTA Entered By: [name] on 08/09/2022

## 2022-08-09 NOTE — Progress Notes (Addendum)
BOWMAN, HIGBIE [name] ([id number]) [id number] Page 1 of 11 Visit Report for 08/09/2022 Chief Complaint Document Details Patient Name: Date of Service: [name] 08/09/2022 11:30 A M Medical Record Number: [id number] Patient Account Number: [id number] Date of Birth/Sex: Treating RN: 05/12/1946 (76 y.o. [name] Primary Care Provider: [name] Other Clinician: Referring Provider: Treating Provider/Extender: [name] in Treatment: 1 Information Obtained from: Patient Chief Complaint Bilateral LE Ulcers Electronic Signature(s) Signed: 08/09/2022 11:30:11 AM By: [name] PA-C Entered By: [name] on 08/09/2022 11:30:10 -------------------------------------------------------------------------------- HPI Details Patient Name: Date of Service: [name], [name] 08/09/2022 11:30 A M Medical Record Number: [id number] Patient Account Number: [id number] Date of Birth/Sex: Treating RN: [birth year] (76 y.o. [name] Primary Care Provider: [name] Other Clinician: Referring Provider: Treating Provider/Extender: [name] in Treatment: 1 History of Present Illness HPI Description: 76 year old male who has a past medical history of essential hypertension, chronic atrial fibrillation, peripheral vascular disease, nonischemic cardiomyopathy,venous stasis dermatitis, gouty arthropathy, basal cell carcinoma of the right lower extremity, benign prostatic hypertrophy, long- term use of anticoagulation therapy, hyperglycemia and exercise intolerance has never been a smoker. the patient has had a vascular workup over 7 years ago and said everything was normal at that stage. He does not have any chronic problems except for cardiac issues which he sees a cardiologist in [place]. 08/15/2017 -- arterial and venous duplex studies still pending. 08/23/2017 -- venous reflux studies done on  08/13/2017 shows venous incompetence throughout the left lower extremity deep system and focally at the left saphenofemoral junction. 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 No evidence of SVT or DVT in bilateral lower extremities The patient has an appointment at the end of the month to get his arterial duplex study done 09/05/2017 -- the patient was seen at the vein and vascular office yesterday by Angelena Form. ABI studies were notable for medial calcification and the toe brachial indices were normal and bilateral ankle-brachial) waveforms were normal with triphasic flow. After review of his venous studies he was not a candidate for laser ablation and his lymphedema was to be treated with compression stockings and lymphedema pump pumps 09/12/2017 -- had a low arterial study done at the [facility] -- unable to obtain reliable ABI is due to medial calcification. Bilateral toe [name], [name] ([id number]) [id number] Page 2 of 11 indices were normal with the right being 1.01 and the left being 0.92 and the waveforms were triphasic bilaterally. he did get hold of 30-40 mm compression stockings but is unable to put these on. We will try and get him alternative compression stockings. 09/26/17- he is here in follow up evaluation of a right lower extremity ulcer;he is compliant in wearing compression stocking; ulcer almost epithelialized , anticipate healing next appointment Readmission: 11/17 point upon evaluation patient's wound currently that he is seeing [place] for today is a skin cancerous lesion that was cleared away by his dermatologist on the left medial calf region. He tells me that this is a very similar thing to what he had done previously in fact the last time he saw him in 2018 this was also what was going on at that point. Nonetheless he feels that based on what he seeing currently that this is just having a lot of harder  time healing although it is much closer to the surface than what he is experienced in  the past. He notes that the initial removal was in June 2022 which was this year this is now November and still has not closed. He does have some edema and definitely I think that there is some venous component to his slow healing here. Also think that we can do something better than Vaseline to try to help with getting this to clear up as quickly as possible. He does have a history of atrial fibrillation and is on Eliquis otherwise he really has no major medical problems that would affect wound healing. 09/07/2021 upon evaluation today patient actually appears to be doing significantly better after having wrapped him last week. Overall I think that this is making significant improvements at this time which is great news. I do not see any evidence of infection which is great news as well. No fevers, chills, nausea, vomiting, or diarrhea. 09/14/2021 upon evaluation today patient appears to be doing well with regard to his leg ulcer. He has been tolerating the dressing changes and overall I think that he is making excellent progress. I do not see any signs of active infection at this time. 09/21/2021 upon evaluation today patient actually appears to be making good progress with regard to his wound this is again measuring smaller today no debridement seems to be necessary. We have been using a silver collagen dressing and I think that is doing an awesome job. 09/28/2021 upon evaluation today patient appears to be doing well with regard to his leg currently. I do not see any signs of active infection at this time which is great news. No fevers, chills, nausea, vomiting, or diarrhea. I think this wound is very close to complete resolution. 10/12/2021 upon evaluation today patient actually appears to be doing awesome in regard to his leg ulcer. In fact this appears to be completely healed based on what I am seeing currently. I  do not see any evidence of active infection locally nor systemically at this time which is also great news. No fevers, chills, nausea, vomiting, or diarrhea. Readmission: 12/07/2021 upon evaluation today patient presents for readmission here in the clinic. He was discharged on 10/12/2021 is completely healed. Unfortunately this has reopened at this point and he is having continual issues with new blisters over both lower extremities. This is even worse than what we previously saw. Nonetheless we did actually check his ABIs today and it did reveal that his ABIs were 0.55 on the left and 0.57 on the right. Subsequently this is a definite change from his last arterial study which showed that he did have good blood flow at 1.01 on the right and 0.92 on the left and that was right at the beginning of 2019. Nonetheless based on what we see currently I do think he tolerated the 3 layer compression wrap but I do believe that we probably need to get him tested for his arterial flow in order to see where things stand and if there is something we can do there that would help prevent this from continue to be an ongoing issue. He did not utilize compression socks in the interim from when he was last here till this time. That something is probably going to need lifelong going forward as well. 3/9; patient presents for follow-up. He has no issues or complaints today. He tolerated the compression wrap well. He had ABIs with TBI's done. He denies signs of infection. 12/21/2021 upon evaluation today patient appears to be doing well with regard to the wounds on his  legs. Both are showing signs of significant improvement which is great news although I do believe some sharp debridement would be of benefit here as well. 12/28/2021 upon evaluation today patient appears to be doing well with regard to his wounds. Everything is showing signs of excellent improvement which I am very pleased about. I think that we are headed in the  right direction here. Fortunately there does not appear to be any evidence of infection which is great news there is a little bit of hypergranulation. 01/04/2022 upon evaluation today patient appears to be doing well with regard to his wounds 2 of them are healed 1 is almost so and the other 1 is significantly better. Overall I am extremely pleased with where we stand and I think that he is making excellent progress here. I do not see any evidence of active infection locally nor systemically at this time. 01-16-2022 upon evaluation today patient's wound on the left leg is showing signs of doing quite well. Has not completely cleared at this point but it is much improved. Fortunately I do not see any signs of infection at this time. No fevers, chills, nausea, vomiting, or diarrhea. 01-23-2022 upon evaluation today patient's wound of the left leg actually appears to be pretty much completely healed which is great news. I do not see any signs of active infection locally or systemically which is excellent. With that being said on the right leg what wound is measuring smaller the other 1 is a new wound that just showed up fortunately its not too bad. Has been using Xeroform here and that seems to be doing decently well which is great news. Unfortunately his blood pressure is significantly high we gave him the readings for the past 4-5 visits as well as a recommendation to make an appointment to go discuss this with his primary care provider patient states that he is going to look into doing this. 01-30-2022 upon evaluation today patient appears to be doing well with regard to his left leg everything appears to be healed. On the right leg the more anterior wound is healed the more medial wound that I been concerned about a possible skin cancer unfortunately still does not look great to me. I do believe that we should probably do a biopsy I have talked about it with him a few times I think though it is probably  time to go ahead and do this at this point. 02-09-2022 upon evaluation today patient appears to be doing well with regard to his legs. On the left this appears to be completely healed. On the right he does have 2 areas and be perfectly honest one of them is a skin cancer that he is going to the Mohs surgery clinic for the other seems to be healing nicely. Readmission: 08-02-2022 upon evaluation today patient appears for reevaluation here in our clinic concerning issues that he has been having with wounds over the bilateral lower extremities. I last saw him in May 2023 and at that point we had him completely healed. Unfortunately he is tells me this has broken down to some degree since that point. 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 He tells me this 1 seems to just be getting bigger and bigger not improving. Fortunately he is  not having any significant pain which is good news he does have quite a bit of swelling and he tells me that his fluid pills are not recommended for him to take daily but just in 3-day intervals here and there. 08-09-2022 upon evaluation today patient appears to be doing still somewhat poorly in regard to his legs although in general he does not appear to be feeling as good as he has been. Fortunately there does not appear to be any signs of infection which is good news. With that being said he is having some issues here with having and overall poor feeling in general which again is good I think going to be the biggest complicating factor. He actually seems to be coughing I do not hear any wheezing right now I did listen to his chest he did not have good airflow down low however makes me suspicious for bronchitis or even possibly pneumonia which could be part of what is going on  here as well. Fortunately I do not see any evidence of anything worsening in regard to his legs but I definitely believe that he needs to continue with the compression wraps he took them off yesterday to shower has not had anything on for 24 hours that is why his legs are so swollen today. With regard to his pathology report I did review that it showed some squamous abnormality but no signs of distinct carcinoma. With that being said it was [name], [name] ([id number]) [id number] Page 3 of 11 saying that it could be adjacent to a squamous cell carcinoma nonetheless my suggestion is can be that we have the patient take copy of this report and give it to his Mohs surgeon in order for them to see if there is anything they feel like needs to be done further. With that being said right now I feel like the primary thing is going to be for [place] to try to get his swelling down and keep that down into that hand since he is having so much drainage I believe we can have to bring him in for dressing changes twice a week doing a nurse visit on Mondays. Electronic Signature(s) Signed: 08/09/2022 1:11:27 PM By: [name] PA-C Entered By: [name] on 08/09/2022 13:11:27 -------------------------------------------------------------------------------- Physical Exam Details Patient Name: Date of Service: [name] Elgin, [name] 08/09/2022 11:30 A M Medical Record Number: [id number] Patient Account Number: [id number] Date of Birth/Sex: Treating RN: 06-16-1946 (76 y.o. [name] Primary Care Provider: [name] Other Clinician: Referring Provider: Treating Provider/Extender: [name] in Treatment: 1 Constitutional Well-nourished and well-hydrated in no acute distress. Respiratory Patient appeared to be taking somewhat labored breaths with him feeling like he was going to cough each time he took a really deep breath. Nonetheless he did not  seem to be struggling to get enough oxygen as his saturation was 97%.. I did not note any active wheezing right now in the bilateral lung fields though he does seem to have very few easily auscultated breath sounds in the lower lung fields which does have me a little bit more concerned about the possibility of a significant bronchitis and/or pneumonia.. Cardiovascular regular rate and rhythm with normal S1, S2. 2+ dorsalis pedis/posterior tibialis pulses. 1+ pitting edema of the bilateral lower extremities. Musculoskeletal normal gait and posture. Psychiatric this patient is able to make decisions and demonstrates good insight into disease process. Alert and Oriented x 3. pleasant and cooperative. Notes Upon  inspection patient's wounds again are all showing signs of being a little bit smaller which is good news. Fortunately I do not see any evidence of infection which is great news and overall I think this is good. With that being said I am concerned about the fact that the patient is experiencing quite a bit of drainage which is causing him some issues here as well. He tells me that it was really starting to smell and that was really bothering him which I completely understand. I think there is a few things we can do to try to help out in that regard. Electronic Signature(s) Signed: 08/09/2022 1:19:58 PM By: [name] PA-C Entered By: [name] on 08/09/2022 13:19:58 Physician Orders Details -------------------------------------------------------------------------------- [name] ([id number]) [id number] Page 4 of 11 Patient Name: Date of Service: [name] [name] Scot [name] 08/09/2022 11:30 A M Medical Record Number: [id number] Patient Account Number: [id number] Date of Birth/Sex: Treating RN: [birth year] (76 y.o. [name] Primary Care Provider: [name] Other Clinician: Referring Provider: Treating Provider/Extender: [name] in Treatment: 1 Verbal / Phone Orders: No Diagnosis Coding ICD-10 Coding Code Description [phone] Chronic venous hypertension (idiopathic) with ulcer and inflammation of bilateral lower extremity L97.822 Non-pressure chronic ulcer of other part of left lower leg with fat layer exposed L97.812 Non-pressure chronic ulcer of other part of right lower leg with fat layer exposed I73.89 Other specified peripheral vascular diseases I48.0 Paroxysmal atrial fibrillation I10 Essential (primary) hypertension Follow-up Appointments ppointment in: - Monday nurse visit Return A Nurse Visit as needed Bathing/ Shower/ Hygiene May shower with wound dressing protected with water repellent cover or cast protector. No tub bath. Anesthetic (Use 'Patient Medications' Section for Anesthetic Order Entry) Wound #9 Left,Lateral Lower Leg Injectable lidocaine applied before procedure. Edema Control - Lymphedema / Segmental Compressive Device / Other Bilateral Lower Extremities Elevate, Exercise Daily and A void Standing for Long Periods of Time. Elevate legs to the level of the heart and pump ankles as often as possible Elevate leg(s) parallel to the floor when sitting. DO YOUR BEST to sleep in the bed at night. DO NOT sleep in your recliner. Long hours of sitting in a recliner leads to swelling of the legs and/or potential wounds on your backside. Additional Orders / Instructions Follow Nutritious Diet and Increase Protein Intake Wound Treatment Wound #10 - Lower Leg Wound Laterality: Left, Medial Cleanser: Soap and Water 1 x Per Week/15 Days Discharge Instructions: Gently cleanse wound with antibacterial soap, rinse and pat dry prior to dressing wounds Peri-Wound Care: Moisturizing Lotion 1 x Per Week/15 Days Discharge Instructions: Suggestions: Theraderm, Eucerin, Cetaphil, or patient preference. Prim Dressing: Silvercel Small 2x2 (in/in) (Generic) 1 x Per Week/15 Days ary Discharge  Instructions: Apply Silvercel Small 2x2 (in/in) as instructed Secondary Dressing: ABD Pad 5x9 (in/in) 1 x Per Week/15 Days Discharge Instructions: Cover with ABD pad Compression Wrap: 3-LAYER WRAP - Profore Lite LF 3 Multilayer Compression Bandaging System 1 x Per Week/15 Days Discharge Instructions: Apply 3 multi-layer wrap as prescribed. Wound #11 - Lower Leg Wound Laterality: Left, Posterior Cleanser: Soap and Water 1 x Per Week/15 Days Discharge Instructions: Gently cleanse wound with antibacterial soap, rinse and pat dry prior to dressing wounds Peri-Wound Care: Moisturizing Lotion 1 x Per Week/15 Days Discharge Instructions: Suggestions: Theraderm, Eucerin, Cetaphil, or patient preference. Prim Dressing: Silvercel Small 2x2 (in/in) (Generic) 1 x Per Week/15 Days ary Discharge Instructions: Apply Silvercel Small 2x2 (in/in) as instructed Secondary  Dressing: ABD Pad 5x9 (in/in) 1 x Per Week/15 Days Discharge Instructions: Cover with ABD pad Compression Wrap: 3-LAYER WRAP - Profore Lite LF 3 Multilayer Compression Bandaging System 1 x Per Week/15 Days Discharge Instructions: Apply 3 multi-layer wrap as prescribed. [name], [name] ([id number]) [id number] Page 5 of 11 Wound #12 - Lower Leg Wound Laterality: Right, Medial Cleanser: Soap and Water 1 x Per Week/15 Days Discharge Instructions: Gently cleanse wound with antibacterial soap, rinse and pat dry prior to dressing wounds Peri-Wound Care: Moisturizing Lotion 1 x Per Week/15 Days Discharge Instructions: Suggestions: Theraderm, Eucerin, Cetaphil, or patient preference. Prim Dressing: Silvercel Small 2x2 (in/in) (Generic) 1 x Per Week/15 Days ary Discharge Instructions: Apply Silvercel Small 2x2 (in/in) as instructed Secondary Dressing: ABD Pad 5x9 (in/in) 1 x Per Week/15 Days Discharge Instructions: Cover with ABD pad Compression Wrap: 3-LAYER WRAP - Profore Lite LF 3 Multilayer Compression Bandaging  System 1 x Per Week/15 Days Discharge Instructions: Apply 3 multi-layer wrap as prescribed. Wound #13 - Lower Leg Wound Laterality: Right, Midline Cleanser: Soap and Water 1 x Per Week/15 Days Discharge Instructions: Gently cleanse wound with antibacterial soap, rinse and pat dry prior to dressing wounds Peri-Wound Care: Moisturizing Lotion 1 x Per Week/15 Days Discharge Instructions: Suggestions: Theraderm, Eucerin, Cetaphil, or patient preference. Prim Dressing: Silvercel Small 2x2 (in/in) (Generic) 1 x Per Week/15 Days ary Discharge Instructions: Apply Silvercel Small 2x2 (in/in) as instructed Secondary Dressing: ABD Pad 5x9 (in/in) 1 x Per Week/15 Days Discharge Instructions: Cover with ABD pad Compression Wrap: 3-LAYER WRAP - Profore Lite LF 3 Multilayer Compression Bandaging System 1 x Per Week/15 Days Discharge Instructions: Apply 3 multi-layer wrap as prescribed. Wound #14 - Lower Leg Wound Laterality: Right, Lateral Cleanser: Soap and Water 1 x Per Week/15 Days Discharge Instructions: Gently cleanse wound with antibacterial soap, rinse and pat dry prior to dressing wounds Peri-Wound Care: Moisturizing Lotion 1 x Per Week/15 Days Discharge Instructions: Suggestions: Theraderm, Eucerin, Cetaphil, or patient preference. Prim Dressing: Silvercel Small 2x2 (in/in) (Generic) 1 x Per Week/15 Days ary Discharge Instructions: Apply Silvercel Small 2x2 (in/in) as instructed Secondary Dressing: ABD Pad 5x9 (in/in) 1 x Per Week/15 Days Discharge Instructions: Cover with ABD pad Compression Wrap: 3-LAYER WRAP - Profore Lite LF 3 Multilayer Compression Bandaging System 1 x Per Week/15 Days Discharge Instructions: Apply 3 multi-layer wrap as prescribed. Wound #9 - Lower Leg Wound Laterality: Left, Lateral Cleanser: Soap and Water 1 x Per Week/15 Days Discharge Instructions: Gently cleanse wound with antibacterial soap, rinse and pat dry prior to dressing wounds Peri-Wound Care: Moisturizing  Lotion 1 x Per Week/15 Days Discharge Instructions: Suggestions: Theraderm, Eucerin, Cetaphil, or patient preference. Prim Dressing: Silvercel Small 2x2 (in/in) (Generic) 1 x Per Week/15 Days ary Discharge Instructions: Apply Silvercel Small 2x2 (in/in) as instructed Secondary Dressing: Zetuvit Plus 4x8 (in/in) 1 x Per Week/15 Days Compression Wrap: 3-LAYER WRAP - Profore Lite LF 3 Multilayer Compression Bandaging System 1 x Per Week/15 Days Discharge Instructions: Apply 3 multi-layer wrap as prescribed. Electronic Signature(s) Signed: 08/09/2022 5:42:41 PM By: [name] PA-C Signed: 08/13/2022 2:21:51 PM By: [name] MSN RN CNS WTA Entered By: [name] on 08/09/2022 12:50:08 KIYOTO, SLOMSKI [name] ([id number]) [id number] Page 6 of 11 -------------------------------------------------------------------------------- Problem List Details Patient Name: Date of Service: [name] [name] Scot [name] 08/09/2022 11:30 A M Medical Record Number: [id number] Patient Account Number: [id number] Date of Birth/Sex: Treating RN: [birth year] (76 y.o. [name] Primary Care Provider: [name] Other Clinician: Referring Provider:  Treating Provider/Extender: [name] in Treatment: 1 Active Problems ICD-10 Encounter Code Description Active Date MDM Diagnosis I87.333 Chronic venous hypertension (idiopathic) with ulcer and inflammation of 08/02/2022 No Yes bilateral lower extremity L97.822 Non-pressure chronic ulcer of other part of left lower leg with fat layer 08/02/2022 No Yes exposed L97.812 Non-pressure chronic ulcer of other part of right lower leg with fat layer 08/02/2022 No Yes exposed I73.89 Other specified peripheral vascular diseases 08/02/2022 No Yes I48.0 Paroxysmal atrial fibrillation 08/02/2022 No Yes I10 Essential (primary) hypertension 08/02/2022 No Yes Inactive Problems Resolved Problems Electronic Signature(s) Signed:  08/09/2022 11:30:05 AM By: [name] PA-C Entered By: [name] on 08/09/2022 11:30:05 Shela Nevin [name] ([id number]) [id number] Page 7 of 11 -------------------------------------------------------------------------------- Progress Note Details Patient Name: Date of Service: [name] [name] Scot [name] 08/09/2022 11:30 A M Medical Record Number: [id number] Patient Account Number: [id number] Date of Birth/Sex: Treating RN: Jan 16, 1946 (76 y.o. 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 the patient has had a vascular workup over 7 years ago and said everything was normal at that stage. He does not have any chronic problems except for cardiac issues which he sees a cardiologist in [place]. 08/15/2017 -- arterial and venous duplex studies still pending. 08/23/2017 -- venous reflux studies done on 08/13/2017 shows venous incompetence throughout the left lower extremity deep system and focally at the left saphenofemoral junction. 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 No evidence of SVT or DVT in bilateral lower extremities The patient has an appointment at the end of the month to get his arterial duplex study done 09/05/2017 -- the patient was seen at the vein and vascular office yesterday by Angelena Form. ABI studies  were notable for medial calcification and the toe brachial indices were normal and bilateral ankle-brachial) waveforms were normal with triphasic flow. After review of his venous studies he was not a candidate for laser ablation and his lymphedema was to be treated with compression stockings and lymphedema pump pumps 09/12/2017 -- had a low arterial study done at the [facility] -- unable to obtain reliable ABI is due to medial calcification. Bilateral toe indices were normal with the right being 1.01 and the left being 0.92 and the waveforms were triphasic bilaterally. he did get hold of 30-40 mm compression stockings but is unable to put these on. We will try and get him alternative compression stockings. 09/26/17- he is here in follow up evaluation of a right lower extremity ulcer;he is compliant in wearing compression stocking; ulcer almost epithelialized , anticipate healing next appointment Readmission: 11/17 point upon evaluation patient's wound currently that he is seeing [place] for today is a skin cancerous lesion that was cleared away by his dermatologist on the left medial calf region. He tells me that this is a very similar thing to what he had done previously in fact the last time he saw him in 2018 this was also what was going on at that point. Nonetheless he feels that based on  what he seeing currently that this is just having a lot of harder time healing although it is much closer to the surface than what he is experienced in the past. He notes that the initial removal was in June 2022 which was this year this is now November and still has not closed. He does have some edema and definitely I think that there is some venous component to his slow healing here. Also think that we can do something better than Vaseline to try to help with getting this to clear up as quickly as possible. He does have a history of atrial fibrillation and is on Eliquis otherwise he really  has no major medical problems that would affect wound healing. 09/07/2021 upon evaluation today patient actually appears to be doing significantly better after having wrapped him last week. Overall I think that this is making significant improvements at this time which is great news. I do not see any evidence of infection which is great news as well. No fevers, chills, nausea, vomiting, or diarrhea. 09/14/2021 upon evaluation today patient appears to be doing well with regard to his leg ulcer. He has been tolerating the dressing changes and overall I think that he is making excellent progress. I do not see any signs of active infection at this time. 09/21/2021 upon evaluation today patient actually appears to be making good progress with regard to his wound this is again measuring smaller today no debridement seems to be necessary. We have been using a silver collagen dressing and I think that is doing an awesome job. 09/28/2021 upon evaluation today patient appears to be doing well with regard to his leg currently. I do not see any signs of active infection at this time which is great news. No fevers, chills, nausea, vomiting, or diarrhea. I think this wound is very close to complete resolution. 10/12/2021 upon evaluation today patient actually appears to be doing awesome in regard to his leg ulcer. In fact this appears to be completely healed based on what I am seeing currently. I do not see any evidence of active infection locally nor systemically at this time which is also great news. No fevers, chills, nausea, vomiting, or diarrhea. Readmission: 12/07/2021 upon evaluation today patient presents for readmission here in the clinic. He was discharged on 10/12/2021 is completely healed. Unfortunately this has reopened at this point and he is having continual issues with new blisters over both lower extremities. This is even worse than what we previously saw. Nonetheless we did actually check his ABIs today  and it did reveal that his ABIs were 0.55 on the left and 0.57 on the right. Subsequently this is a definite change from his last arterial study which showed that he did have good blood flow at 1.01 on the right and 0.92 on the left and that was right at the beginning of 2019. Nonetheless based on what we see currently I do think he tolerated the 3 layer compression wrap but I do believe that we probably need to get him tested for his arterial flow in order to see where things stand and if there is something we can do there that would help prevent this from continue to be an [name], [name] ([id number]) [id number] Page 8 of 11 ongoing issue. He did not utilize compression socks in the interim from when he was last here till this time. That something is probably going to need lifelong going forward as well. 3/9; patient presents for follow-up. He has  no issues or complaints today. He tolerated the compression wrap well. He had ABIs with TBI's done. He denies signs of infection. 12/21/2021 upon evaluation today patient appears to be doing well with regard to the wounds on his legs. Both are showing signs of significant improvement which is great news although I do believe some sharp debridement would be of benefit here as well. 12/28/2021 upon evaluation today patient appears to be doing well with regard to his wounds. Everything is showing signs of excellent improvement which I am very pleased about. I think that we are headed in the right direction here. Fortunately there does not appear to be any evidence of infection which is great news there is a little bit of hypergranulation. 01/04/2022 upon evaluation today patient appears to be doing well with regard to his wounds 2 of them are healed 1 is almost so and the other 1 is significantly better. Overall I am extremely pleased with where we stand and I think that he is making excellent progress here. I do not see any evidence  of active infection locally nor systemically at this time. 01-16-2022 upon evaluation today patient's wound on the left leg is showing signs of doing quite well. Has not completely cleared at this point but it is much improved. Fortunately I do not see any signs of infection at this time. No fevers, chills, nausea, vomiting, or diarrhea. 01-23-2022 upon evaluation today patient's wound of the left leg actually appears to be pretty much completely healed which is great news. I do not see any signs of active infection locally or systemically which is excellent. With that being said on the right leg what wound is measuring smaller the other 1 is a new wound that just showed up fortunately its not too bad. Has been using Xeroform here and that seems to be doing decently well which is great news. Unfortunately his blood pressure is significantly high we gave him the readings for the past 4-5 visits as well as a recommendation to make an appointment to go discuss this with his primary care provider patient states that he is going to look into doing this. 01-30-2022 upon evaluation today patient appears to be doing well with regard to his left leg everything appears to be healed. On the right leg the more anterior wound is healed the more medial wound that I been concerned about a possible skin cancer unfortunately still does not look great to me. I do believe that we should probably do a biopsy I have talked about it with him a few times I think though it is probably time to go ahead and do this at this point. 02-09-2022 upon evaluation today patient appears to be doing well with regard to his legs. On the left this appears to be completely healed. On the right he does have 2 areas and be perfectly honest one of them is a skin cancer that he is going to the Mohs surgery clinic for the other seems to be healing nicely. Readmission: 08-02-2022 upon evaluation today patient appears for reevaluation here in our  clinic concerning issues that he has been having with wounds over the bilateral lower extremities. I last saw him in May 2023 and at that point we had him completely healed. Unfortunately he is tells me this has broken down to some degree since that point. 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 He tells me this 1 seems to just be getting bigger and bigger not improving. Fortunately he is not having any significant pain which is good news he does have quite a bit of swelling and he tells me that his fluid pills are not recommended for him to take daily but just in 3-day intervals here and there. 08-09-2022 upon evaluation today patient appears to be doing still somewhat poorly in regard to his legs although in general he does not appear to be feeling as good as he has been. Fortunately there does not appear to be any signs of infection which is good news. With that being said he is having some issues here with having and overall poor feeling in general which again is good I think going to be the biggest complicating factor. He actually seems to be coughing I do not hear any wheezing right now I did listen to his chest he did not have good airflow down low however makes me suspicious for bronchitis or even possibly pneumonia which could be part of what is going on here as well. Fortunately I do not see any evidence of anything worsening in regard to his legs but I definitely believe that he needs to continue with the compression wraps he took them off yesterday to shower has not had anything on for 24 hours that is why his legs are so swollen today. With regard to his pathology report I did review that it showed some squamous abnormality but no signs of distinct carcinoma. With that being said it  was saying that it could be adjacent to a squamous cell carcinoma nonetheless my suggestion is can be that we have the patient take copy of this report and give it to his Mohs surgeon in order for them to see if there is anything they feel like needs to be done further. With that being said right now I feel like the primary thing is going to be for [place] to try to get his swelling down and keep that down into that hand since he is having so much drainage I believe we can have to bring him in for dressing changes twice a week doing a nurse visit on Mondays. Objective Constitutional Well-nourished and well-hydrated in no acute distress. Vitals Time Taken: 11:56 AM, Height: 75 in, Weight: 225 lbs, BMI: 28.1, Temperature: 97.9 F, Pulse: 58 bpm, Respiratory Rate: 16 breaths/min. Respiratory Patient appeared to be taking somewhat labored breaths with him feeling like he was going to cough each time he took a really deep breath. Nonetheless he did not seem to be struggling to get enough oxygen as his saturation was 97%.. I did not note any active wheezing right now in the bilateral lung fields though he does seem to have very few easily auscultated breath sounds in the lower lung fields which does have me a little bit more concerned about the possibility of a significant bronchitis and/or pneumonia.. Cardiovascular regular rate and rhythm with normal S1, S2. 2+ dorsalis pedis/posterior tibialis pulses. 1+ pitting edema of the bilateral lower extremities. Musculoskeletal normal gait and posture. Psychiatric this patient is able to make decisions and demonstrates good insight into disease process. Alert and Oriented x 3. pleasant and cooperative. General Notes: Upon inspection patient's wounds again are all showing signs of being a little bit smaller which is good news. Fortunately I do not  see any evidence of infection which is great news and overall I think this is good. With that being said I am  concerned about the fact that the patient is experiencing quite a bit of drainage which is causing him some issues here as well. He tells me that it was really starting to smell and that was really bothering him which [name], [name] ([id number]) [id number] Page 9 of 11 completely understand. I think there is a few things we can do to try to help out in that regard. Integumentary (Hair, Skin) Wound #10 status is Open. Original cause of wound was Gradually Appeared. The date acquired was: 01/20/2022. The wound has been in treatment 1 weeks. The wound is located on the Left,Medial Lower Leg. The wound measures 1cm length x 1.6cm width x 0.2cm depth; 1.257cm^2 area and 0.251cm^3 volume. There is a medium amount of serous drainage noted. Wound #11 status is Open. Original cause of wound was Gradually Appeared. The date acquired was: 01/20/2022. The wound has been in treatment 1 weeks. The wound is located on the Left,Posterior Lower Leg. The wound measures 0.7cm length x 0.7cm width x 0.1cm depth; 0.385cm^2 area and 0.038cm^3 volume. There is Fat Layer (Subcutaneous Tissue) exposed. There is a medium amount of serous drainage noted. The wound margin is distinct with the outline attached to the wound base. There is small (1-33%) red granulation within the wound bed. There is no necrotic tissue within the wound bed. Wound #12 status is Open. Original cause of wound was Gradually Appeared. The date acquired was: 01/22/2022. The wound has been in treatment 1 weeks. The wound is located on the Right,Medial Lower Leg. The wound measures 1cm length x 1cm width x 0.1cm depth; 0.785cm^2 area and 0.079cm^3 volume. There is a large amount of serous drainage noted. There is medium (34-66%) red granulation within the wound bed. There is a medium (34-66%) amount of necrotic tissue within the wound bed including Adherent Slough. Wound #13 status is Open. Original cause of wound was Gradually  Appeared. The date acquired was: 01/22/2022. The wound has been in treatment 1 weeks. The wound is located on the Right,Midline Lower Leg. The wound measures 0.3cm length x 0.2cm width x 0.1cm depth; 0.047cm^2 area and 0.005cm^3 volume. There is a large amount of serous drainage noted. Wound #14 status is Open. Original cause of wound was Gradually Appeared. The date acquired was: 01/20/2022. The wound has been in treatment 1 weeks. The wound is located on the Right,Lateral Lower Leg. The wound measures 1cm length x 1.2cm width x 0.1cm depth; 0.942cm^2 area and 0.094cm^3 volume. There is Fat Layer (Subcutaneous Tissue) exposed. There is no tunneling or undermining noted. There is a medium amount of serous drainage noted. There is medium (34-66%) red granulation within the wound bed. There is a medium (34-66%) amount of necrotic tissue within the wound bed including Adherent Slough. Wound #9 status is Open. Original cause of wound was Gradually Appeared. The date acquired was: 01/20/2022. The wound has been in treatment 1 weeks. The wound is located on the Left,Lateral Lower Leg. The wound measures 5.5cm length x 4.3cm width x 0.1cm depth; 18.575cm^2 area and 1.857cm^3 volume. There is Fat Layer (Subcutaneous Tissue) exposed. There is a large amount of serosanguineous drainage noted. The wound margin is thickened. There is medium (34-66%) red, hyper - granulation within the wound bed. There is a medium (34-66%) amount of necrotic tissue within the wound bed including Adherent Slough. 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 No tub bath. Anesthetic (Use 'Patient Medications' Section for Anesthetic Order Entry): Wound #9 Left,Lateral Lower Leg: Injectable lidocaine applied before procedure. Edema Control - Lymphedema / Segmental Compressive Device / Other: Elevate, Exercise Daily and Avoid Standing for Long Periods of Time. Elevate legs to the level of the heart and pump ankles as often as possible Elevate leg(s) parallel to the floor when sitting. DO YOUR BEST to sleep in the bed at night. DO NOT sleep in your recliner. Long hours of sitting in a recliner leads to swelling of the legs and/or potential wounds on your backside. Additional Orders / Instructions: Follow Nutritious Diet and Increase Protein Intake WOUND #10: - Lower Leg Wound Laterality: Left, Medial Cleanser: Soap and Water 1 x Per Week/15 Days Discharge Instructions: Gently cleanse wound with antibacterial soap, rinse and pat dry prior to dressing wounds Peri-Wound Care: Moisturizing Lotion 1 x Per Week/15 Days Discharge Instructions: Suggestions: Theraderm, Eucerin, Cetaphil, or patient preference. Prim Dressing: Silvercel Small 2x2 (in/in) (Generic) 1 x Per Week/15 Days ary Discharge Instructions: Apply Silvercel Small 2x2 (in/in) as instructed Secondary Dressing: ABD Pad 5x9 (in/in) 1 x Per Week/15 Days Discharge Instructions: Cover with ABD pad Com pression Wrap: 3-LAYER WRAP - Profore Lite LF 3 Multilayer Compression Bandaging System 1 x Per Week/15 Days Discharge Instructions: Apply 3 multi-layer wrap as prescribed. WOUND #11: - Lower Leg Wound Laterality: Left, Posterior Cleanser: Soap and Water 1 x Per Week/15 Days Discharge Instructions: Gently cleanse wound with antibacterial soap, rinse and pat dry prior to dressing wounds Peri-Wound Care:  Moisturizing Lotion 1 x Per Week/15 Days Discharge Instructions: Suggestions: Theraderm, Eucerin, Cetaphil, or patient preference. Prim Dressing: Silvercel Small 2x2 (in/in) (Generic) 1 x Per Week/15 Days ary Discharge Instructions: Apply Silvercel Small 2x2 (in/in) as instructed Secondary Dressing: ABD Pad 5x9 (in/in) 1 x Per [address] [name], [name] ([id number]) [id number] Page 10 of 11 Discharge Instructions: Cover with ABD pad Com pression Wrap: 3-LAYER WRAP - Profore Lite LF 3 Multilayer Compression Bandaging System 1 x Per Week/15 Days Discharge Instructions: Apply 3 multi-layer wrap as prescribed. WOUND #12: - Lower Leg Wound Laterality: Right, Medial Cleanser: Soap and Water 1 x Per Week/15 Days Discharge Instructions: Gently cleanse wound with antibacterial soap, rinse and pat dry prior to dressing wounds Peri-Wound Care: Moisturizing Lotion 1 x Per Week/15 Days Discharge Instructions: Suggestions: Theraderm, Eucerin, Cetaphil, or patient preference. Prim Dressing: Silvercel Small 2x2 (in/in) (Generic) 1 x Per Week/15 Days ary Discharge Instructions: Apply Silvercel Small 2x2 (in/in) as instructed Secondary Dressing: ABD Pad 5x9 (in/in) 1 x Per Week/15 Days Discharge Instructions: Cover with ABD pad Com pression Wrap: 3-LAYER WRAP - Profore Lite LF 3 Multilayer Compression Bandaging System 1 x Per Week/15 Days Discharge Instructions: Apply 3 multi-layer wrap as prescribed. WOUND #13: - Lower Leg Wound Laterality: Right, Midline Cleanser: Soap and Water 1 x Per Week/15 Days Discharge Instructions: Gently cleanse wound with antibacterial soap, rinse and pat dry prior to dressing wounds Peri-Wound Care: Moisturizing Lotion 1 x Per Week/15  Days Discharge Instructions: Suggestions: Theraderm, Eucerin, Cetaphil, or patient preference. Prim Dressing: Silvercel Small 2x2 (in/in) (Generic) 1 x Per Week/15 Days ary Discharge Instructions: Apply Silvercel  Small 2x2 (in/in) as instructed Secondary Dressing: ABD Pad 5x9 (in/in) 1 x Per Week/15 Days Discharge Instructions: Cover with ABD pad Com pression Wrap: 3-LAYER WRAP - Profore Lite LF 3 Multilayer Compression Bandaging System 1 x Per Week/15 Days Discharge Instructions: Apply 3 multi-layer wrap as prescribed. WOUND #14: - Lower Leg Wound Laterality: Right, Lateral Cleanser: Soap and Water 1 x Per Week/15 Days Discharge Instructions: Gently cleanse wound with antibacterial soap, rinse and pat dry prior to dressing wounds Peri-Wound Care: Moisturizing Lotion 1 x Per Week/15 Days Discharge Instructions: Suggestions: Theraderm, Eucerin, Cetaphil, or patient preference. Prim Dressing: Silvercel Small 2x2 (in/in) (Generic) 1 x Per Week/15 Days ary Discharge Instructions: Apply Silvercel Small 2x2 (in/in) as instructed Secondary Dressing: ABD Pad 5x9 (in/in) 1 x Per Week/15 Days Discharge Instructions: Cover with ABD pad Com pression Wrap: 3-LAYER WRAP - Profore Lite LF 3 Multilayer Compression Bandaging System 1 x Per Week/15 Days Discharge Instructions: Apply 3 multi-layer wrap as prescribed. WOUND #9: - Lower Leg Wound Laterality: Left, Lateral Cleanser: Soap and Water 1 x Per Week/15 Days Discharge Instructions: Gently cleanse wound with antibacterial soap, rinse and pat dry prior to dressing wounds Peri-Wound Care: Moisturizing Lotion 1 x Per Week/15 Days Discharge Instructions: Suggestions: Theraderm, Eucerin, Cetaphil, or patient preference. Prim Dressing: Silvercel Small 2x2 (in/in) (Generic) 1 x Per Week/15 Days ary Discharge Instructions: Apply Silvercel Small 2x2 (in/in) as instructed Secondary Dressing: Zetuvit Plus 4x8 (in/in) 1 x Per Week/15 Days Com pression Wrap: 3-LAYER WRAP - Profore Lite LF 3 Multilayer Compression Bandaging System 1 x Per Week/15 Days Discharge Instructions: Apply 3 multi-layer wrap as prescribed. 1. I would recommend based on what I am seeing currently  that we have the patient go ahead and initiate a continuation of treatment with the silver cell to the wounds followed by CarboFlex on the wounds that are draining more significantly. 2. Also can continue with ABD pads to cover followed by the 3 layer compression wrap which I think is good to do a good job as far as helping to keep the edema under good control. 3. I am also going to recommend the patient should continue to try to elevate his legs much as possible. 4. Actually did contact the patient's primary care provider office in order to see if we can get an appointment for further evaluation. I was actually able to get an appointment with him today and he is going to be going over there at 140 for evaluation of one of the PAs with Dr. Caryn Section as Dr. Caryn Section did not have any specific opening himself. Nonetheless I think this will still be a good option here and we can get things checked out and make sure that he does not indeed have pneumonia I think at minimum he probably is having some bronchitis going on. We will see patient back for reevaluation in 1 week here in the clinic. If anything worsens or changes patient will contact our office for additional recommendations. Electronic Signature(s) Signed: 08/09/2022 1:21:14 PM By: [name] PA-C Entered By: [name] on 08/09/2022 13:21:14 -------------------------------------------------------------------------------- SuperBill Details Patient Name: Date of Service: [name] [name], [name] 08/09/2022 Medical Record Number: [id number] Patient Account Number: [id number] Date of Birth/Sex: Treating RN: 08/08/1946 (76 y.o. Alexus, Michael, Heber [name] ([id number]) [id number] Page 11 of  11 Primary Care Provider: [name] Other Clinician: Referring Provider: Treating Provider/Extender: [name] in Treatment: 1 Diagnosis Coding ICD-10 Codes Code Description [phone] Chronic  venous hypertension (idiopathic) with ulcer and inflammation of bilateral lower extremity L97.822 Non-pressure chronic ulcer of other part of left lower leg with fat layer exposed L97.812 Non-pressure chronic ulcer of other part of right lower leg with fat layer exposed I73.89 Other specified peripheral vascular diseases I48.0 Paroxysmal atrial fibrillation I10 Essential (primary) hypertension Facility Procedures : CPT4 Code: [id number] Description: 09233 - WOUND CARE VISIT-LEV 5 EST PT Modifier: Quantity: 1 Physician Procedures : CPT4 Code Description Modifier [id number] - WC PHYS LEVEL 4 - EST PT ICD-10 Diagnosis Description 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

## 2022-08-09 NOTE — Telephone Encounter (Signed)
Pt c/o BP issue: STAT if pt c/o blurred vision, one-sided weakness or slurred speech  1. What are your last 5 BP readings? Around 200/100, some went to 217/100's  2. Are you having any other symptoms (ex. Dizziness, headache, blurred vision, passed out)? Cough, SOB but does not sound SOB  3. What is your BP issue? Patient's BP has been high  Pt c/o swelling: STAT is pt has developed SOB within 24 hours  If swelling, where is the swelling located? Ankles, knees, and feet  How much weight have you gained and in what time span? Does not think he has gained weight  Have you gained 3 pounds in a day or 5 pounds in a week? no  Do you have a log of your daily weights (if so, list)? no  Are you currently taking a fluid pill? yes  Are you currently SOB? Gets SOB does not sound SOB on phone  Have you traveled recently? No   Patient states he has had high BP and swelling. He states he has also had a cough and gets SOB. Patient did not sound SOB on the phone. Offered patient an appointment Monday with APP, but sister declined.

## 2022-08-09 NOTE — Progress Notes (Signed)
I,Roshena L Chambers,acting as a Education administrator for [employer], PA-C.,have documented all relevant documentation on the behalf of Mardene Speak, PA-C,as directed by  [employer], PA-C while in the presence of [employer], PA-C.   Established patient visit   Patient: [name]   DOB: 03-11-1946   76 y.o. Male  MRN: [id number] Visit Date: 08/09/2022  Today's healthcare provider: Mardene Speak, PA-C   Chief Complaint  Patient presents with   Cough   Hypertension   Subjective    Cough This is a chronic problem. Episode onset: 1 month ago. The problem has been waxing and waning. The cough is Non-productive. Associated symptoms include postnasal drip, shortness of breath (with exertion) and wheezing. Pertinent negatives include no chest pain, chills, fever or headaches. Associated symptoms comments: lethargic. Treatments tried: Mucinex. The treatment provided no relief.     Medications: Outpatient Medications Prior to Visit  Medication Sig   carvedilol (COREG) 12.5 MG tablet TAKE 1 TABLET BY MOUTH TWICE A DAY   chlorthalidone (HYGROTON) 25 MG tablet TAKE 1 TABLET BY MOUTH ONCE DAILY IN THEMORNING   furosemide (LASIX) 20 MG tablet TAKE 1 TABLET BY MOUTH ONCE A DAY FOR EDEMA (DO NOT TAKE MORE THAN THREE TABS A WEEK)   Multiple Vitamin (MULTIVITAMIN) tablet Take 1 tablet by mouth daily.   valsartan (DIOVAN) 160 MG tablet TAKE 1 TABLET BY MOUTH ONCE DAILY   XARELTO 20 MG TABS tablet TAKE 1 TABLET BY MOUTH ONCE DAILY WITH SUPPER   No facility-administered medications prior to visit.    Review of Systems  Constitutional:  Positive for fatigue. Negative for appetite change, chills, diaphoresis and fever.  HENT:  Positive for postnasal drip.   Respiratory:  Positive for cough, shortness of breath (with exertion) and wheezing. Negative for chest tightness.   Cardiovascular:  Negative for chest pain and palpitations.  Gastrointestinal:  Negative for abdominal pain, nausea and  vomiting.  Neurological:  Negative for headaches.       Objective    BP (!) 205/117 (BP Location: Right Arm, Patient Position: Sitting, Cuff Size: Large)   Pulse 65   Temp 98.3 F (36.8 C) (Oral)   Resp 18   SpO2 100% Comment: room air   Today's Vitals   08/09/22 1354 08/09/22 1402  BP: (!) 202/98 (!) 205/117  Pulse: 66 65  Resp: 18   Temp: 98.3 F (36.8 C)   TempSrc: Oral   SpO2: 100%    There is no height or weight on file to calculate BMI.   Physical Exam Vitals reviewed.  Constitutional:      General: He is not in acute distress.    Appearance: Normal appearance. He is not diaphoretic.  HENT:     Head: Normocephalic and atraumatic.  Eyes:     General: No scleral icterus.    Conjunctiva/sclera: Conjunctivae normal.  Cardiovascular:     Rate and Rhythm: Normal rate. Rhythm irregular.     Pulses: Normal pulses.     Heart sounds: Normal heart sounds. No murmur heard. Pulmonary:     Effort: Pulmonary effort is normal. No respiratory distress.     Breath sounds: Rales (occasional, mostly at the base of lungs) present. No wheezing or rhonchi.  Musculoskeletal:     Cervical back: Neck supple.     Right lower leg: No edema.     Left lower leg: No edema.  Lymphadenopathy:     Cervical: No cervical adenopathy.  Skin:  General: Skin is warm and dry.     Findings: No rash.  Neurological:     Mental Status: He is alert and oriented to person, place, and time. Mental status is at baseline.  Psychiatric:        Mood and Affect: Mood normal.        Behavior: Behavior normal.      No results found for any visits on 08/09/22.  Assessment & Plan    Essential hypertension BP today was more than 174 -  systolic and more than 944  - diastolic This morning, pt took only valsartan  '160mg'$  and carvedilol 12.'5mg'$  His vital other than BP are WNL Neuro exam was unremarkable today  No complaints on CP Increased the dose of valsartan to 320 mg daily and dose of  chlorthlidone to 50 mg daily, continue to take his other antihypertensive medications: carvedilol and lasix as prescribed Advised to measure his BP at home Needs to proceed with labs, EKG Needs FU next week with Dr. Caryn Section or Cardiology Explained that if he will experience any signs of stroke or heart attack, he needs to proceed to ER Pt decided against it and left . We tried to reach him and convey all management changes with his sister. See note above.  Pt's sister mentioned that pt has problems with transportation and cannot do imaging/labs.  Due to concerns of his compliance and problems with transportation, pt was explained on what type of evaluation he would currently need and left to his discretion to decide if he wants to be seen by cardiology/Dr. Caryn Section next Monday and start on different medication regimen today or proceed to ER for IV management.   SOB (shortness of breath) X1 mo Chronic and worsening? Reports feeling lethargic? Abnormal lung exam - DG Chest 2 View; Future to rule out pneumonia or pleural effusion or HF Presence of leg swelling in the setting of venous insuffiencey, stasis dermatitis Will need to do some labs for initial workup  Persistent dry cough New problem x 1 week Some scattered bibasilar crackles noted - DG Chest 2 View; Future to r/o pneumonia Not witnessed by me  Per pt, wound specialist worried about him having pneumonia Pt was explained that we will addressed the need for abx after CXR results  Lung field abnormal finding on examination Could be related to respiratory infection vs volume overload vs HF - DG Chest 2 View; Future Will reassess after the CXR results And labs  Leg swelling Peripheral vascular disease (HCC) In the setting of venous insufficiency and stasis dermatitis Was evaluated for chronic leg ulcers at the wound clinic this morning Per pt, the wounds healing well? He did not take lasix today as pt did not notices any changes  in the leg swelling He is taking lasix only if his weight change and swelling worsens Per chart review, he has been seeing wound clinic regularly for the past year.  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 Continue to take Xarelto as prescribed  Hyperglycemia Last A1c was Advised to repeat his labs today Will monitor     The patient was advised to call back or seek an in-person evaluation if the symptoms worsen or if the condition fails to improve as anticipated.  I discussed the assessment and treatment plan with the patient. The patient was provided an opportunity to ask questions and all were answered. The patient agreed with the  plan and demonstrated an understanding of the instructions.  The entirety of the information documented in the History of Present Illness, Review of Systems and Physical Exam were personally obtained by me. Portions of this information were initially documented by the CMA and reviewed by me for thoroughness and accuracy.  Portions of this note were created using dictation software and may contain typographical errors.     Mardene Speak, PA-C  [facility] [phone] (phone) [phone] (fax)  [place]

## 2022-08-09 NOTE — ED Provider Triage Note (Signed)
Emergency Medicine Provider Triage Evaluation Note  [name] , a 76 y.o. male  was evaluated in triage.  Pt complains of high blood pressure.  Patient has wounds on his lower extremities that were being managed by wound care in [place].  States that while there, found that blood pressure was very high.  He was sent to the emergency department for this.  No chest pain or shortness of breath.  No abdominal pain.  Review of Systems  Positive: Lower extremity wounds, chronic Negative: Chest pain, shortness of breath, abdominal pain  Physical Exam  BP (!) 213/97 (BP Location: Right Arm)   Pulse 85   Temp 98.1 F (36.7 C) (Oral)   Resp 20   SpO2 97%  Gen:   Awake, no distress   Resp:  Normal effort  MSK:   Moves extremities without difficulty  Other:  Regular rhythm  Medical Decision Making  Medically screening exam initiated at 7:10 PM.  Appropriate orders placed.  [name] was informed that the remainder of the evaluation will be completed by another provider, this initial triage assessment does not replace that evaluation, and the importance of remaining in the ED until their evaluation is complete.     Carlisle Cater, PA-C 08/09/22 1913

## 2022-08-09 NOTE — ED Triage Notes (Signed)
Patient reports elevated blood pressure at home 210/120 this evening , denies any symptoms , takes antihypertensive medications .

## 2022-08-09 NOTE — Telephone Encounter (Signed)
Mailbox is full. Cannot leave a message.

## 2022-08-10 ENCOUNTER — Emergency Department (HOSPITAL_COMMUNITY): Payer: Medicare PPO

## 2022-08-10 DIAGNOSIS — R600 Localized edema: Secondary | ICD-10-CM | POA: Diagnosis not present

## 2022-08-10 DIAGNOSIS — I10 Essential (primary) hypertension: Secondary | ICD-10-CM | POA: Diagnosis not present

## 2022-08-10 LAB — HEPATIC FUNCTION PANEL
ALT: 19 U/L (ref 0–44)
AST: 24 U/L (ref 15–41)
Albumin: 3.9 g/dL (ref 3.5–5.0)
Alkaline Phosphatase: 94 U/L (ref 38–126)
Bilirubin, Direct: 0.3 mg/dL — ABNORMAL HIGH (ref 0.0–0.2)
Indirect Bilirubin: 0.9 mg/dL (ref 0.3–0.9)
Total Bilirubin: 1.2 mg/dL (ref 0.3–1.2)
Total Protein: 7.7 g/dL (ref 6.5–8.1)

## 2022-08-10 LAB — BRAIN NATRIURETIC PEPTIDE: B Natriuretic Peptide: 564.2 pg/mL — ABNORMAL HIGH (ref 0.0–100.0)

## 2022-08-10 LAB — TROPONIN I (HIGH SENSITIVITY): Troponin I (High Sensitivity): 45 ng/L — ABNORMAL HIGH (ref <18)

## 2022-08-10 MED ORDER — DOXYCYCLINE HYCLATE 100 MG PO CAPS: 100.0000 mg | ORAL_CAPSULE | Freq: Two times a day (BID) | ORAL | 0 refills | Status: DC

## 2022-08-10 MED ORDER — FUROSEMIDE 10 MG/ML IJ SOLN
40.0000 mg | Freq: Once | INTRAMUSCULAR | Status: AC
  Administered 2022-08-10: 40 mg via INTRAVENOUS
  Filled 2022-08-10: qty 4

## 2022-08-10 MED ORDER — FUROSEMIDE 20 MG PO TABS: 20.0000 mg | ORAL_TABLET | Freq: Every day | ORAL | 0 refills | Status: DC

## 2022-08-10 MED ORDER — POTASSIUM CHLORIDE CRYS ER 20 MEQ PO TBCR: 40.0000 meq | EXTENDED_RELEASE_TABLET | Freq: Every day | ORAL | 0 refills | Status: DC

## 2022-08-10 NOTE — ED Provider Notes (Signed)
[facility] EMERGENCY DEPARTMENT Provider Note   CSN: [id number] Arrival date & time: 08/09/22  1853     History  Chief Complaint  Patient presents with   Hypertension    [name] is a 76 y.o. male.  76 yo M with a chief complaints of high blood pressure.  This was noted at his doctor's visit this morning.  He was being evaluated for chronic leg wounds.  He felt like they had gotten a bit worse and the one on his left leg had become a bit more swollen and he had had more drainage that was foul-smelling.  These were wrapped at the wound clinic and when he went to his family doctor's office and had his blood pressure medications changed.  He was concerned about his blood pressure and so then checked into the emergency department here for evaluation.  He has had some shortness of breath on exertion for some time.  Does not feel like that is gotten any worse.  Has been coughing for the past few days to a week.  No fevers.   Hypertension       Home Medications Prior to Admission medications   Medication Sig Start Date End Date Taking? Authorizing Provider  potassium chloride SA (KLOR-CON M) 20 MEQ tablet Take 2 tablets (40 mEq total) by mouth daily for 5 days. 08/10/22 08/15/22 Yes Tyrone Nine, Jaelen Gellerman, DO  carvedilol (COREG) 12.5 MG tablet TAKE 1 TABLET BY MOUTH TWICE A DAY 10/23/21   Leonie Man, MD  chlorthalidone (HYGROTON) 25 MG tablet TAKE 1 TABLET BY MOUTH ONCE DAILY IN [facility] 05/11/21   Leonie Man, MD  doxycycline (VIBRAMYCIN) 100 MG capsule Take 1 capsule (100 mg total) by mouth 2 (two) times daily. One po bid x 7 days 08/10/22   Deno Etienne, DO  furosemide (LASIX) 20 MG tablet Take 1 tablet (20 mg total) by mouth daily. 08/10/22 09/09/22  Deno Etienne, DO  Multiple Vitamin (MULTIVITAMIN) tablet Take 1 tablet by mouth daily.    [provider]  valsartan (DIOVAN) 160 MG tablet TAKE 1 TABLET BY MOUTH ONCE DAILY 10/27/21   Leonie Man, MD  XARELTO  20 MG TABS tablet TAKE 1 TABLET BY MOUTH ONCE DAILY WITH SUPPER 12/26/21   Leonie Man, MD      Allergies    Benazepril    Review of Systems   Review of Systems  Physical Exam Updated Vital Signs BP (!) 206/88 (BP Location: Left Arm)   Pulse 73   Temp 97.9 F (36.6 C) (Oral)   Resp 18   SpO2 99%  Physical Exam Vitals and nursing note reviewed.  Constitutional:      Appearance: He is well-developed.  HENT:     Head: Normocephalic and atraumatic.  Eyes:     Pupils: Pupils are equal, round, and reactive to light.  Neck:     Vascular: No JVD.  Cardiovascular:     Rate and Rhythm: Normal rate and regular rhythm.     Heart sounds: No murmur heard.    No friction rub. No gallop.  Pulmonary:     Effort: No respiratory distress.     Breath sounds: No wheezing.  Abdominal:     General: There is no distension.     Tenderness: There is no abdominal tenderness. There is no guarding or rebound.  Musculoskeletal:        General: Normal range of motion.     Cervical back: Normal  range of motion and neck supple.     Comments: Bilateral lower extremity edema with chronic appearing skin changes.  Ulceration through the outer layer of the skin to the lateral aspect of the left lower leg.  No obvious induration or fluctuance no appreciable drainage.  Skin:    Coloration: Skin is not pale.     Findings: No rash.  Neurological:     Mental Status: He is alert and oriented to person, place, and time.     Comments: No obvious neurologic deficit on exam  Psychiatric:        Behavior: Behavior normal.     ED Results / Procedures / Treatments   Labs (all labs ordered are listed, but only abnormal results are displayed) Labs Reviewed  CBC WITH DIFFERENTIAL/PLATELET - Abnormal; Notable for the following components:      Result Value   RBC 4.07 (*)    All other components within normal limits  BASIC METABOLIC PANEL - Abnormal; Notable for the following components:   Glucose, Bld 116  (*)    BUN 7 (*)    All other components within normal limits  HEPATIC FUNCTION PANEL - Abnormal; Notable for the following components:   Bilirubin, Direct 0.3 (*)    All other components within normal limits  BRAIN NATRIURETIC PEPTIDE - Abnormal; Notable for the following components:   B Natriuretic Peptide 564.2 (*)    All other components within normal limits  TROPONIN I (HIGH SENSITIVITY) - Abnormal; Notable for the following components:   Troponin I (High Sensitivity) 45 (*)    All other components within normal limits    EKG EKG Interpretation  Date/Time:  Thursday August 09 2022 19:29:55 EDT Ventricular Rate:  66 PR Interval:    QRS Duration: 122 QT Interval:  444 QTC Calculation: 465 R Axis:   -33 Text Interpretation: Atrial fibrillation Left axis deviation Septal infarct , age undetermined Abnormal ECG No significant change since last tracing Confirmed by Deno Etienne [id number] on 08/10/2022 12:18:39 AM  Radiology DG Chest Port 1 View  Result Date: 08/10/2022 CLINICAL DATA:  Hypertension, leg edema EXAM: PORTABLE CHEST 1 VIEW COMPARISON:  02/24/2019 FINDINGS: Heart is mildly enlarged. No confluent airspace opacities, effusions or edema. No acute bony abnormality. IMPRESSION: No active disease. Electronically Signed   By: Rolm Baptise M.D.   On: 08/10/2022 01:37    Procedures Procedures    Medications Ordered in ED Medications  furosemide (LASIX) injection 40 mg (40 mg Intravenous Given 08/10/22 0211)    ED Course/ Medical Decision Making/ A&P                           Medical Decision Making Amount and/or Complexity of Data Reviewed Labs: ordered. Radiology: ordered.  Risk Prescription drug management.   76 yo M with a chief complaint of high blood pressure.  The patient has already been seen twice this morning for cough as well as leg swelling.  He is now here because he was concerned about his blood pressure being elevated.  He is telling me that his leg  swelling is quite a bit worse than it has been.  Does not appear to be obviously infected on my exam.  No leukocytosis.  No significant electrolyte abnormality.  Will obtain a chest x-ray BNP troponin.  Chest x-ray independently interpreted by me without focal infiltrate or pneumothorax.  No obvious signs of edema for me.  Patient's BNP is slightly above  his baseline.  His troponin is also mildly elevated.  He has no obvious acute anginal symptoms.  I suspect its unlikely that he has an acute PE or aortic dissection.  I think he is mildly fluid overloaded.  Will give him a bolus dose of Lasix here.  Have him increase his Lasix at home.  Have him call his cardiologist in the morning to try and set up an urgent appointment.  We will have him return for worsening.  3:00 AM:  I have discussed the diagnosis/risks/treatment options with the patient and family.  Evaluation and diagnostic testing in the emergency department does not suggest an emergent condition requiring admission or immediate intervention beyond what has been performed at this time.  They will follow up with PCP, cards. We also discussed returning to the ED immediately if new or worsening sx occur. We discussed the sx which are most concerning (e.g., sudden worsening sob, chest pain, rapid spreading redness, fever, inability to tolerate by mouth) that necessitate immediate return. Medications administered to the patient during their visit and any new prescriptions provided to the patient are listed below.  Medications given during this visit Medications  furosemide (LASIX) injection 40 mg (40 mg Intravenous Given 08/10/22 0211)     The patient appears reasonably screen and/or stabilized for discharge and I doubt any other medical condition or other [facility] requiring further screening, evaluation, or treatment in the ED at this time prior to discharge.          Final Clinical Impression(s) / ED Diagnoses Final diagnoses:  Bilateral leg  edema  Cough in adult patient    Rx / DC Orders ED Discharge Orders          Ordered    Ambulatory referral to Cardiology       Comments: If you have not heard from the Cardiology office within the next 72 hours please call [phone].   08/10/22 0255    doxycycline (VIBRAMYCIN) 100 MG capsule  2 times daily,   Status:  Discontinued        08/10/22 0256    furosemide (LASIX) 20 MG tablet  Daily       Note to Pharmacy: PATIENT MUST SCHEDULE ANNUAL APPOINTMENT FOR FUTURE REFILLS.   08/10/22 0256    doxycycline (VIBRAMYCIN) 100 MG capsule  2 times daily        08/10/22 0257    potassium chloride SA (KLOR-CON M) 20 MEQ tablet  Daily        08/10/22 0300              Deno Etienne, DO 08/10/22 0300

## 2022-08-10 NOTE — ED Notes (Signed)
Lab to run add on labs

## 2022-08-10 NOTE — ED Notes (Signed)
Dr. Floyd at bedside. 

## 2022-08-10 NOTE — Discharge Instructions (Signed)
I want you to take your Lasix every day until you are able to be seen by your cardiologist.  Please take the doxycycline as prescribed.  Return for worsening difficulty breathing if you develop a fever or you have rapid spreading redness to your legs.

## 2022-08-10 NOTE — ED Notes (Signed)
Dr. Tyrone Nine notified of pt's BP

## 2022-08-13 DIAGNOSIS — I7389 Other specified peripheral vascular diseases: Secondary | ICD-10-CM | POA: Diagnosis not present

## 2022-08-13 DIAGNOSIS — L97822 Non-pressure chronic ulcer of other part of left lower leg with fat layer exposed: Secondary | ICD-10-CM | POA: Diagnosis not present

## 2022-08-13 DIAGNOSIS — I480 Paroxysmal atrial fibrillation: Secondary | ICD-10-CM | POA: Diagnosis not present

## 2022-08-13 DIAGNOSIS — L97812 Non-pressure chronic ulcer of other part of right lower leg with fat layer exposed: Secondary | ICD-10-CM | POA: Diagnosis not present

## 2022-08-13 DIAGNOSIS — I10 Essential (primary) hypertension: Secondary | ICD-10-CM | POA: Diagnosis not present

## 2022-08-13 DIAGNOSIS — Z85828 Personal history of other malignant neoplasm of skin: Secondary | ICD-10-CM | POA: Diagnosis not present

## 2022-08-13 DIAGNOSIS — I87333 Chronic venous hypertension (idiopathic) with ulcer and inflammation of bilateral lower extremity: Secondary | ICD-10-CM | POA: Diagnosis not present

## 2022-08-13 NOTE — Progress Notes (Unsigned)
Cardiology Office Note    Date:  08/15/2022   ID:  [name], [name] 05/16/1946, MRN [id number]  PCP:  Birdie Sons, MD  Cardiologist:  Glenetta Hew, MD  Electrophysiologist:  None   Chief Complaint: swelling, elevated blood pressure  History of Present Illness:   [name] is a 76 y.o. male with history of permanent atrial fibrillation, chronic HFpEF with prior remote LV dysfunction, essential HTN, venous stasis dermatitis, PAD by noninvasive testing (12/2021 noncompressible LE arteries but no significant decrease in perfusion), HLD, mild carotid plaque 2006, hypogonadism who is seen for ER follow-up. He has a hx of Afib dating back to 2000. Around 2005 he had a cath for abnormal nuc/LVEF 30% which showed no significant CAD and no RAS. He had undergone TEE/DCCV around that time. However, he had recurrent Afib over the years and has been treated with rate control strategy. His last echo in 2009 showed  EF >55%, mild-moderate RV dilation, moderate-severe LAE, mild-moderate RAE, borderline MVP, trace TR, mild aortic sclerosis, mild pulm regurgitation, RVSP normal. He follows with the [facility] wound clinic for lower extremity wounds. He saw PCP 08/09/22 with foul smelling leg wound discharge with increased BLE edema, severe HTN, cough, and worsening SOB. There were some proposed med changes but he ultimately went to the ER as suggested. CXR showed NAD. Labs outlined below with elevated BNP/troponin. He received dose of IV Lasix with plan for increased home dosing and early follow-up. He was also started on doxycycline.  He returns for follow-up today with his son who notes that around the time of the recent events they had uncovered that the patient had not been taking his blood pressure medicines as prescribed for at least a week. They are now helping him get back on track and he's been doing much better. Prior to recent exacerbation he was on Lasix only PRN. Now he is on '20mg'$  daily. They  report marked improvement in LE edema back to baseline. He denies any orthopnea or change on chronic DOE. He has generalized fatigue. He adamantly denies any recent chest pain. Initial BP 158/72 with recheck 138/72, took BP meds about 1.5 hours ago.   Labwork independently reviewed: 08/09/22 BNP 564, troponin 45, LFTs ok, K 3.5, Cr 0.89, Hgb 13, plt wnl 11/2021 A1C 5.8, LDL 87, trig 68 11/2020 TSH wnl  Cardiology Studies:   Studies reviewed are outlined and summarized above. Reports included below if pertinent.   ABI 12/2021  Summary:  Bilateral: Resting bilateral ankle-brachial indexes indicate  noncompressible lower extremity arteries. Doppler waveforms and normal  TBIs do not suggest a significant decrease in arterial perfusion to the  bilateral lower extremities.    *See table(s) above for measurements and observations.     2D echo 2009 EF >55%, mild-moderate RV dilation, moderate-severe LAE, mild-moderate RAE, borderline MVP, trace TR, mild aortic sclerosis, mild pulm regurgitation, RVSP normal.  Carotid 2006   1.     Slight plaque formation is noted at the carotid bifurcation on the  RIGHT.  2.     No stenosis is identified on either side.  3.     Antegrade flow is noted in both vertebrals.   2005 TEE DCCV     DESCRIPTION OF OPERATION:  After obtaining informed written consent, the  patient was brought to the endoscopy suite in the fasting state.  The  patient underwent successful noncomplicated transesophageal echocardiogram.    There was moderate LV dilatation with moderate to severely  depressed LV  systolic function.  Estimated EF is approximately 30%.    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    Obstruction of  the mitral valve with mild mitral regurgitation.    Obstruction of the tricuspid valve with mild tricuspid regurgitation.    No evidence of intracardiac mass or thrombus noted.    There was a moderate amount of spontaneous echo contrast noted in the  left  atrium and left atrial appendage as well as LV consistent with sluggish  flow.    Normal descending thoracic aorta.    Upon conclusion of the TEE, the patient then was given 125 mg of Pentothal  by Dr. Maryjean Morn.  The patient then received one biphasic shock at 150 joules  with restoration to sinus bradycardia.  He awoke and was transferred to the  recovery room in satisfactory condition.    CONCLUSION:  1. Successful transesophageal echocardiogram with findings noted above.  2. Successful DC cardioversion to sinus rhythm.    Cath 2005 DATE OF PROCEDURE:  03/28/2004  DATE OF DISCHARGE:                               CARDIAC CATHETERIZATION    PROCEDURES:  1. Left heart catheterization.  2. Coronary angiography.  3. Left ventriculogram.  4. Abdominal aortogram.    ATTENDING PHYSICIAN:  Octavia Heir, M.D.    COMPLICATIONS:  None.    INDICATIONS:  Mr. Gallery is a 76 year old male patient of Dr. [name]  with history of atrial fibrillation dated back to 2000, history of  moderately depressed EF by echo at approximately 30%, history of abnormal  Cardiolite scan suggesting a possible anterior wall scar who is now brought  for Coumadin to heparin crossover with subsequent cardiac catheterization,  TEE guided cardioversion and lipoma excision from the right forehead.    DESCRIPTION OF OPERATION:  After obtaining informed written consent, the  patient was brought to the cardiac catheterization laboratory.  Right and  left groin were  shaved and prepped and draped in the usual sterile fashion.  ECG monitor was established.  Using the modified Seldinger technique, a 6  French arterial sheath was inserted in the right femoral artery. A 6 French  diagnostic catheter was then used to performed diagnostic angiography.  This  reveals a large left main with no significant disease.  The LAD is a large  vessel that coursed to the apex and gives rise to two diagonal branches.   The LAD has no significant disease.  The first and second diagonal are  medium size vessels with no significant disease.    The left coronary artery also gives rise to a medium size ramus intermedius  and bifurcates in the mid segment and has no significant disease.    Left circumflex is a large vessel which courses in AV groove and goes to one  large bifurcating obtuse marginal.  The AV groove circumflex has no  significant disease.  The first OM is a large vessel that bifurcates in the  mid segment and has no significant disease.    The right coronary artery is a large vessel which is dominant.  Gives rise  to both PDA as well as posterior lateral branch.  There is no significant  disease in the RCA, PDA or posterior lateral branch.    LEFT VENTRICULOGRAM:  Left ventriculogram reveals a severely depressed EF at  25-30%.  There is  global hypokinesis.    ABDOMINAL AORTOGRAM:  Reveals no evidence of renal artery stenosis.    HEMODYNAMICS:  Systemic arterial pressure 140/84, LV pressure 130/10, LVEDP  of 14.    CONCLUSIONS:  1. No significant coronary artery disease.  2. Moderate to severely depressed left ventricular systolic function.  3. No evidence of renal artery stenosis.  4. Systemic hypertension.                                                  Octavia Heir, M.D.      Past Medical History:  Diagnosis Date   Basal cell carcinoma of right lower extremity 03/24/2015   BPH with obstruction/lower urinary tract symptoms    Chronic atrial fibrillation (HCC)    Chronic heart failure with preserved ejection fraction (HFpEF) (HCC)    Dislocation of shoulder, anterior, right, closed 07/24/2014   Echocardiogram abnormal    2009 moderate to severely dilated left atrium   Erectile dysfunction    Essential hypertension    Gout    History of chicken pox    History of measles    Hyperglycemia    Hypogonadism in male    Nocturia    Peyronie's disease    Traumatic tear of  right rotator cuff 07/24/2014   Venous stasis    chronic    Past Surgical History:  Procedure Laterality Date   CARDIAC CATHETERIZATION  03/28/2004   normal coronaries, reduced EF at 25-30% (Dr. Gerrie Nordmann)   CARDIOVERSION, TEE guided  03/29/2004   Dr. Gerrie Nordmann    COLONOSCOPY  05/28/2013   Dr. Rayann Heman   HERNIA REPAIR  age 38   KNEE SURGERY     LUMBAR FUSION  age 75   MOHS SURGERY Right 08/20/2019   ALA done by Ishmael Holter, MD [facility]   MYOVIEW CARDIOVASCULAR STRESS TEST  07/2003   anterior wall thinning, LV systolic function depressed at 33%   PILONIDAL CYST EXCISION     Right Eyebrow Tumor Removed  age 58    SHOULDER ARTHROSCOPY WITH ROTATOR CUFF REPAIR AND SUBACROMIAL DECOMPRESSION Right 08/19/2014   Procedure: SHOULDER ARTHROSCOPY WITH ROTATOR CUFF REPAIR AND SUBACROMIAL DECOMPRESSION;  Surgeon: Lorn Junes, MD;  Location: [facility];  Service: Orthopedics;  Laterality: Right;   TOTAL HIP ARTHROPLASTY Right 03/25/2006   Dr. Wanda Plump. [place]   TRANSTHORACIC ECHOCARDIOGRAM  08/2008   EF=>55%, mild-mod dilated; LA mod-severely dilated; RA mild-mod dilated; mild calcif of MV, borderline MVP, trace MR; trace TR; mild pulm valve regurg     Current Medications: Current Meds  Medication Sig   carvedilol (COREG) 12.5 MG tablet TAKE 1 TABLET BY MOUTH TWICE A DAY   chlorthalidone (HYGROTON) 25 MG tablet Take 1 tablet (25 mg total) by mouth daily.   doxycycline (VIBRAMYCIN) 100 MG capsule Take 1 capsule (100 mg total) by mouth 2 (two) times daily. One po bid x 7 days   furosemide (LASIX) 20 MG tablet Take 1 tablet (20 mg total) by mouth daily.   Multiple Vitamin (MULTIVITAMIN) tablet Take 1 tablet by mouth daily.   potassium chloride SA (KLOR-CON M) 20 MEQ tablet Take 2 tablets (40 mEq total) by mouth daily for 5 days.   valsartan (DIOVAN) 160 MG tablet TAKE 1 TABLET BY MOUTH ONCE DAILY   XARELTO 20 MG TABS tablet TAKE 1 TABLET  BY MOUTH ONCE DAILY WITH SUPPER       Allergies:   Benazepril   Social History   Socioeconomic History   Marital status: Divorced    Spouse name: Not on file   Number of children: 1   Years of education: Not on file   Highest education level: Master's degree (e.g., MA, MS, MEng, MEd, MSW, MBA)  Occupational History   Occupation: retired Pharmacist, hospital, taught PE and coached  Tobacco Use   Smoking status: Never   Smokeless tobacco: Never  Vaping Use   Vaping Use: Never used  Substance and Sexual Activity   Alcohol use: Yes    Alcohol/week: 4.0 standard drinks of alcohol    Types: 4 Cans of beer per week   Drug use: No   Sexual activity: Not on file  Other Topics Concern   Not on file  Social History Narrative   Divorced father of 1. Retired Education officer, museum.   ~2 beers / day. Does not smoke.   Works out 6 days a weeks - cardio & weights. He used to run and play basketball prior to his hip surgery in 2007.   Currently dating.   Social Determinants of Health   Financial Resource Strain: Low Risk  (11/22/2021)   Overall Financial Resource Strain (CARDIA)    Difficulty of Paying Living Expenses: Not hard at all  Food Insecurity: No Food Insecurity (11/22/2021)   Hunger Vital Sign    Worried About Running Out of Food in the Last Year: Never true    Ran Out of Food in the Last Year: Never true  Transportation Needs: No Transportation Needs (11/22/2021)   PRAPARE - Hydrologist (Medical): No    Lack of Transportation (Non-Medical): No  Physical Activity: Insufficiently Active (11/22/2021)   Exercise Vital Sign    Days of Exercise per Week: 3 days    Minutes of Exercise per Session: 30 min  Stress: No Stress Concern Present (11/22/2021)   [place]    Feeling of Stress : Not at all  Social Connections: Moderately Isolated (11/22/2021)   Social Connection and Isolation Panel [NHANES]    Frequency of Communication with Friends  and Family: More than three times a week    Frequency of Social Gatherings with Friends and Family: More than three times a week    Attends Religious Services: Never    Marine scientist or Organizations: No    Attends Music therapist: Never    Marital Status: Living with partner     Family History:  The patient's family history includes Arthritis in his sister; Bladder Cancer in his father; Diabetes in his maternal uncle and paternal grandfather; Hypertension in his father and mother; Prostate cancer in his father; Stroke in his maternal grandfather; Supraventricular tachycardia in his brother and child. There is no history of Kidney disease, Heart attack, or Colon cancer.  ROS:   Please see the history of present illness.  All other systems are reviewed and otherwise negative.    EKG(s)/Additional Labs   EKG:  EKG is not ordered today but reviewed from recent ER visit 08/10/22 atrial fib 66bpm, LAD, possible prior septal infarct, nonspecific STTW Changes  Recent Labs: 08/09/2022: ALT 19; B Natriuretic Peptide 564.2; BUN 7; Creatinine, Ser 0.89; Hemoglobin 13.0; Platelets 285; Potassium 3.5; Sodium 139  Recent Lipid Panel    Component Value Date/Time   CHOL 160 11/22/2021 1150  TRIG 68 11/22/2021 1150   TRIG 65 01/06/2009 0000   HDL 60 11/22/2021 1150   CHOLHDL 2.7 11/22/2021 1150   CHOLHDL 3.9 03/03/2019 1154   VLDL 18 03/03/2019 1154   LDLCALC 87 11/22/2021 1150    PHYSICAL EXAM:    VS:  BP (!) 158/78   Pulse (!) 55   Ht '6\' 3"'$  (1.905 m)   Wt 227 lb 3.2 oz (103.1 kg)   SpO2 95%   BMI 28.40 kg/m   BMI: Body mass index is 28.4 kg/m.  GEN: Well nourished, well developed male in no acute distress HEENT: normocephalic, atraumatic Neck: no JVD, carotid bruits, or masses Cardiac: irregularly irregular; no murmurs, rubs, or gallops, mild LE edema with BLE extremities wrapped with dressing (follows with wound clinic) Respiratory:  clear to auscultation  bilaterally, normal work of breathing GI: soft, nontender, nondistended, + BS MS: no deformity or atrophy Skin: warm and dry, no rash Neuro:  Alert and Oriented x 3, Strength and sensation are intact, follows commands Psych: euthymic mood, full affect  Wt Readings from Last 3 Encounters:  08/15/22 227 lb 3.2 oz (103.1 kg)  08/14/22 228 lb (103.4 kg)  11/22/21 252 lb (114.3 kg)     ASSESSMENT & PLAN:   1. Acute on chronic HFpEF with prior RV dilation, mild pulm regurgitation, essential HTN - suspect recent exacerbation driven by being out of antihypertensives for at least a week. He was given IV Lasix in ER with robust response and started on Lasix '20mg'$  daily with improvement back to baseline. Though we often do not have patients on both thiazide diuretic and Lasix, he is doing much better on this regimen so I would continue for now but check labs today to ensure K, Cr stable. We will proceed with 2D echocardiogram to help guide additional med changes. If there is significant hypokalemia found or LV dysfunction, would give consideration to changing chlorthalidone to spironolactone +/- changing ARB to Entresto. We'll employ a stepwise approach. Reviewed 2g sodium restriction, 2L fluid restriction, daily weights with patient. We also reviewed following BP at least 3 hours after AM meds.  2. Permanent atrial fibrillation - rate controlled on carvedilol. Recent Hgb stable. Dose of Xarelto remains appropriate.  3. Elevated troponin - isolated value of 45 obtained in ER, not felt to reflect ACS. Suspect demand ischemia. He has not had any chest pain. We will obtain echocardiogram as above. If LV dysfunction is present or develops accelerating symptoms, would consider updating ischemic evaluation.  4. Mild carotid disease, HLD - will obtain updated carotid dopplers. He is not currently on a statin. Lipids historically managed by PCP, would consider initiation based on duplex results - but would prefer  to change meds in stepwise fashion to avoid med confusion in case of any intolerances.  5. Suspected PAD - had abnormal ABIs 12/2021, being treated for chronic LE wounds by [facility] wound center. Will arrange LE arterial duplex for a closer look at the vasculature. If abnormal, patient/son agreeable to [facility] referral within [facility].     Disposition: F/u with Dr. Ellyn Hack as previously scheduled on 09/26/22.   Medication Adjustments/Labs and Tests Ordered: Current medicines are reviewed at length with the patient today.  Concerns regarding medicines are outlined above. Medication changes, Labs and Tests ordered today are summarized above and listed in the Patient Instructions accessible in Encounters.   Signed, Charlie Pitter, PA-C  08/15/2022 9:19 AM    [place] Phone: [phone]; Fax: [phone]  

## 2022-08-14 ENCOUNTER — Telehealth: Payer: Self-pay | Admitting: *Deleted

## 2022-08-14 ENCOUNTER — Encounter: Payer: Self-pay | Admitting: Physician Assistant

## 2022-08-14 ENCOUNTER — Ambulatory Visit: Payer: Medicare PPO | Admitting: Family Medicine

## 2022-08-14 ENCOUNTER — Encounter: Payer: Self-pay | Admitting: Family Medicine

## 2022-08-14 VITALS — BP 147/53 | HR 48 | Temp 98.3°F | Resp 70 | Wt 228.0 lb

## 2022-08-14 DIAGNOSIS — R0602 Shortness of breath: Secondary | ICD-10-CM

## 2022-08-14 DIAGNOSIS — I10 Essential (primary) hypertension: Secondary | ICD-10-CM | POA: Diagnosis not present

## 2022-08-14 DIAGNOSIS — I509 Heart failure, unspecified: Secondary | ICD-10-CM | POA: Diagnosis not present

## 2022-08-14 DIAGNOSIS — R7989 Other specified abnormal findings of blood chemistry: Secondary | ICD-10-CM

## 2022-08-14 DIAGNOSIS — I872 Venous insufficiency (chronic) (peripheral): Secondary | ICD-10-CM | POA: Diagnosis not present

## 2022-08-14 DIAGNOSIS — R531 Weakness: Secondary | ICD-10-CM

## 2022-08-14 DIAGNOSIS — I4821 Permanent atrial fibrillation: Secondary | ICD-10-CM | POA: Diagnosis not present

## 2022-08-14 MED ORDER — CHLORTHALIDONE 25 MG PO TABS: 25.0000 mg | ORAL_TABLET | Freq: Every day | ORAL | 2 refills | Status: DC

## 2022-08-14 NOTE — Progress Notes (Signed)
I,Roshena L Chambers,acting as a scribe for [name], MD.,have documented all relevant documentation on the behalf of [name], MD,as directed by  [name], MD while in the presence of [name], MD.    Established patient visit   Patient: [name]   DOB: 03/30/1946   76 y.o. Male  MRN: [id number] Visit Date: 08/14/2022  Today's healthcare provider: [name], MD   Chief Complaint  Patient presents with   Follow-up   Subjective    HPI  Follow up ER visit  Patient was seen in ER for elevated blood pressure on 08/09/2022. He was treated for bilateral leg edema, cough and venous stasis dermatitis of both lower extremities. Treatment for this included furosemide and antibiotics. He was also referred to follow up with his regular cardiologist Dr. Ellyn Hack due to elevated Troponin I and BNP. Has cardiology follow up scheduled tomorrow.  He reports good compliance with treatment. Was seen by wound center yesterday. He reports this condition is Improved.  -----------------------------------------------------------------------------------------   Medications: Outpatient Medications Prior to Visit  Medication Sig   carvedilol (COREG) 12.5 MG tablet TAKE 1 TABLET BY MOUTH TWICE A DAY   doxycycline (VIBRAMYCIN) 100 MG capsule Take 1 capsule (100 mg total) by mouth 2 (two) times daily. One po bid x 7 days   furosemide (LASIX) 20 MG tablet Take 1 tablet (20 mg total) by mouth daily.   Multiple Vitamin (MULTIVITAMIN) tablet Take 1 tablet by mouth daily.   potassium chloride SA (KLOR-CON M) 20 MEQ tablet Take 2 tablets (40 mEq total) by mouth daily for 5 days.   valsartan (DIOVAN) 160 MG tablet TAKE 1 TABLET BY MOUTH ONCE DAILY   XARELTO 20 MG TABS tablet TAKE 1 TABLET BY MOUTH ONCE DAILY WITH SUPPER   chlorthalidone (HYGROTON) 25 MG tablet TAKE 1 TABLET BY MOUTH ONCE DAILY IN THEMORNING (Patient not taking: Reported on 08/14/2022)   No facility-administered  medications prior to visit.    Review of Systems  Constitutional:  Negative for appetite change, chills and fever.  Respiratory:  Positive for cough and shortness of breath (during exertion). Negative for chest tightness and wheezing.   Cardiovascular:  Positive for leg swelling. Negative for chest pain and palpitations.  Gastrointestinal:  Positive for constipation. Negative for abdominal pain, nausea and vomiting.  Musculoskeletal:  Positive for back pain and gait problem.       Objective    BP (!) 147/53 (BP Location: Left Arm, Patient Position: Sitting, Cuff Size: Large)   Pulse (!) 48   Temp 98.3 F (36.8 C) (Oral)   Resp (!) 70   Wt 228 lb (103.4 kg)   SpO2 99% Comment: room air  BMI 28.50 kg/m    Today's Vitals   08/14/22 1037 08/14/22 1044  BP: (!) 154/82 (!) 147/53  Pulse: (!) 59 (!) 48  Resp: (!) 70   Temp: 98.3 F (36.8 C)   TempSrc: Oral   SpO2: 99%   Weight: 228 lb (103.4 kg)    Body mass index is 28.5 kg/m.   Physical Exam   General: Appearance:    Well developed, well nourished male in no acute distress  Eyes:    PERRL, conjunctiva/corneas clear, EOM's intact       Lungs:     Clear to auscultation bilaterally, respirations unlabored  Heart:    Bradycardic. Irregularly irregular rhythm. No murmurs, rubs, or gallops.    MS:   All extremities are intact.  1+  edema both feet, both legs wrapped, managed at wound clinic.   Neurologic:   Awake, alert, oriented x 3. No apparent focal neurological defect.         Assessment & Plan     1. Essential hypertension Continue current medications.  BP was very high at recent ER visit. Dispense history looks like chlorthalidone has out. Refilled chlorthalidone '25mg'$  today. Will get some help with medication management from [place].   2. Permanent atrial fibrillation (HCC) CHA2DS2-VASc Score 3; On Xarelto On DOAC, rate controlled   3. SOB (shortness of breath) Likely due to CHF as below.   4. Elevated brain  natriuretic peptide (BNP) level   5. Elevated troponin   6. Congestive heart failure, unspecified HF chronicity, unspecified heart failure type ([place]) Follow up cardiology tomorrow as scheduled.   - Ambulatory referral to Home Health  7. Weakness  - Ambulatory referral to Home Health  8. Venous stasis dermatitis of both lower extremities ABI done 12/2021 ordered by wound clinic revealed non-compressible arteries. No felt to have significant vascular perfusion impairment. Family is requesting additional vascular evaluation.     Addressed extensive list of chronic and acute medical problems today requiring 45 minutes reviewing his medical record, counseling patient regarding his conditions and coordination of care.        The entirety of the information documented in the History of Present Illness, Review of Systems and Physical Exam were personally obtained by me. Portions of this information were initially documented by the CMA and reviewed by me for thoroughness and accuracy.     [name], MD  [facility] [phone] (phone) [phone] (fax)  [place]

## 2022-08-14 NOTE — Patient Outreach (Addendum)
  Care Coordination TOC Note Transition Care Management Unsuccessful Follow-up Telephone Call  Date of discharge and from where:  [facility]  [phone]  Attempts:  1st Attempt  Reason for unsuccessful TCM follow-up call:  Unable to leave message Per person answering the phone stated that there is no Ruben Mahler that lives there.  [place] [facility] [phone]

## 2022-08-14 NOTE — Patient Instructions (Signed)
.   Please review the attached list of medications and notify my office if there are any errors.   . Please bring all of your medications to every appointment so we can make sure that our medication list is the same as yours.   

## 2022-08-15 ENCOUNTER — Telehealth: Payer: Self-pay | Admitting: *Deleted

## 2022-08-15 ENCOUNTER — Encounter: Payer: Self-pay | Admitting: Physician Assistant

## 2022-08-15 ENCOUNTER — Ambulatory Visit: Payer: Medicare PPO | Attending: Physician Assistant | Admitting: Physician Assistant

## 2022-08-15 VITALS — BP 138/72 | HR 55 | Ht 75.0 in | Wt 227.2 lb

## 2022-08-15 DIAGNOSIS — R7989 Other specified abnormal findings of blood chemistry: Secondary | ICD-10-CM | POA: Diagnosis not present

## 2022-08-15 DIAGNOSIS — I5033 Acute on chronic diastolic (congestive) heart failure: Secondary | ICD-10-CM

## 2022-08-15 DIAGNOSIS — I779 Disorder of arteries and arterioles, unspecified: Secondary | ICD-10-CM | POA: Diagnosis not present

## 2022-08-15 DIAGNOSIS — E785 Hyperlipidemia, unspecified: Secondary | ICD-10-CM | POA: Diagnosis not present

## 2022-08-15 DIAGNOSIS — I739 Peripheral vascular disease, unspecified: Secondary | ICD-10-CM

## 2022-08-15 DIAGNOSIS — I10 Essential (primary) hypertension: Secondary | ICD-10-CM

## 2022-08-15 DIAGNOSIS — I4821 Permanent atrial fibrillation: Secondary | ICD-10-CM

## 2022-08-15 NOTE — Progress Notes (Signed)
[name] ([id number]) [id number] Page 1 of 2 Visit Report for 08/13/2022 Physician Orders Details Patient Name: Date of Service: [name] 08/13/2022 11:15 A M Medical Record Number: [id number] Patient Account Number: [id number] Date of Birth/Sex: Treating RN: 18-May-1946 (76 y.o. [name] Primary Care Provider: [name] Other Clinician: Massie Kluver Referring Provider: Treating Provider/Extender: Eldridge Dace, MICHA EL Annye English in Treatment: 1 Verbal / Phone Orders: No Diagnosis Coding Follow-up Appointments ppointment in: - Monday nurse visit Return A Nurse Visit as needed Bathing/ Shower/ Hygiene May shower with wound dressing protected with water repellent cover or cast protector. No tub bath. Anesthetic (Use 'Patient Medications' Section for Anesthetic Order Entry) Wound #9 Left,Lateral Lower Leg Injectable lidocaine applied before procedure. Edema Control - Lymphedema / Segmental Compressive Device / Other Bilateral Lower Extremities Elevate, Exercise Daily and A void Standing for Long Periods of Time. Elevate legs to the level of the heart and pump ankles as often as possible Elevate leg(s) parallel to the floor when sitting. DO YOUR BEST to sleep in the bed at night. DO NOT sleep in your recliner. Long hours of sitting in a recliner leads to swelling of the legs and/or potential wounds on your backside. Additional Orders / Instructions Follow Nutritious Diet and Increase Protein Intake Wound Treatment Wound #10 - Lower Leg Wound Laterality: Left, Medial Cleanser: Soap and Water 1 x Per Week/15 Days Discharge Instructions: Gently cleanse wound with antibacterial soap, rinse and pat dry prior to dressing wounds Peri-Wound Care: Moisturizing Lotion 1 x Per Week/15 Days Discharge Instructions: Suggestions: Theraderm, Eucerin, Cetaphil, or patient preference. Prim Dressing: Silvercel Small 2x2 (in/in)  (Generic) 1 x Per Week/15 Days ary Discharge Instructions: Apply Silvercel Small 2x2 (in/in) as instructed Secondary Dressing: ABD Pad 5x9 (in/in) 1 x Per Week/15 Days Discharge Instructions: Cover with ABD pad Compression Wrap: 3-LAYER WRAP - Profore Lite LF 3 Multilayer Compression Bandaging System 1 x Per Week/15 Days Discharge Instructions: Apply 3 multi-layer wrap as prescribed. Wound #12 - Lower Leg Wound Laterality: Right, Medial Cleanser: Soap and Water 1 x Per Week/15 Days Discharge Instructions: Gently cleanse wound with antibacterial soap, rinse and pat dry prior to dressing wounds Peri-Wound Care: Moisturizing Lotion 1 x Per Week/15 Days Discharge Instructions: Suggestions: Theraderm, Eucerin, Cetaphil, or patient preference. Prim Dressing: Silvercel Small 2x2 (in/in) (Generic) 1 x Per Week/15 Days ary Discharge Instructions: Apply Silvercel Small 2x2 (in/in) as instructed [name], [name] ([id number]) [id number] Page 2 of 2 Secondary Dressing: ABD Pad 5x9 (in/in) 1 x Per Week/15 Days Discharge Instructions: Cover with ABD pad Compression Wrap: 3-LAYER WRAP - Profore Lite LF 3 Multilayer Compression Bandaging System 1 x Per Week/15 Days Discharge Instructions: Apply 3 multi-layer wrap as prescribed. Electronic Signature(s) Signed: 08/13/2022 4:14:14 PM By: Linton Ham MD Signed: 08/14/2022 5:15:48 PM By: Massie Kluver Entered By: Massie Kluver on 08/13/2022 12:14:18 -------------------------------------------------------------------------------- SuperBill Details Patient Name: Date of Service: [name] N, [name] 08/13/2022 Medical Record Number: [id number] Patient Account Number: [id number] Date of Birth/Sex: Treating RN: 1946-06-07 (76 y.o. [name] Primary Care Provider: [name] Other Clinician: Massie Kluver Referring Provider: Treating Provider/Extender: Eldridge Dace, MICHA EL Annye English in Treatment: 1 Diagnosis  Coding ICD-10 Codes Code Description [phone] Chronic venous hypertension (idiopathic) with ulcer and inflammation of bilateral lower extremity L97.822 Non-pressure chronic ulcer of other part of left lower leg with fat layer exposed L97.812 Non-pressure chronic ulcer of other part of  right lower leg with fat layer exposed I73.89 Other specified peripheral vascular diseases I48.0 Paroxysmal atrial fibrillation I10 Essential (primary) hypertension Facility Procedures : CPT4: Code [id number] 29 fo Description: 361 BILATERAL: Application of multi-layer venous compression system; leg (below knee), including ankle and ot. ICD-10 Diagnosis Description I87.333 Chronic venous hypertension (idiopathic) with ulcer and inflammation of bilateral lower ex Modifier: tremity Quantity: 1 Electronic Signature(s) Signed: 08/13/2022 4:14:14 PM By: Linton Ham MD Signed: 08/14/2022 5:15:48 PM By: Massie Kluver Entered By: Massie Kluver on 08/13/2022 12:15:11

## 2022-08-15 NOTE — Patient Outreach (Signed)
  Care Coordination [facility] Note Transition Care Management Follow-up Telephone Call Date of discharge and from where: [facility] [phone] How have you been since you were released from the hospital? Doing much better. The swelling in my legs have went down tremendously Any questions or concerns? No  Items Reviewed: Did the pt receive and understand the discharge instructions provided? Yes  Medications obtained and verified? Yes  Other? No  Any new allergies since your discharge? No  Dietary orders reviewed? No Do you have support at home? Yes   Home Care and Equipment/Supplies: Were home health services ordered? not applicable If so, what is the name of the agency? [name]  Has the agency set up a time to come to the patient's home? not applicable Were any new equipment or medical supplies ordered?  No What is the name of the medical supply agency? [name] Were you able to get the supplies/equipment? not applicable Do you have any questions related to the use of the equipment or supplies? No  Functional Questionnaire: (I = Independent and D = Dependent) ADLs: I  Bathing/Dressing- I  Meal Prep- I  Eating- I  Maintaining continence- I  Transferring/Ambulation- I  Managing Meds- I  Follow up appointments reviewed:  PCP Hospital f/u appt confirmed? Yes  Dr Alm Bustard [id number]: 10:00 [facility] f/u appt confirmed? Yes  Melina Copa PA  8:50 Are transportation arrangements needed? No  If their condition worsens, is the pt aware to call PCP or go to the Emergency Dept.? Yes Was the patient provided with contact information for the PCP's office or ED? Yes Was to pt encouraged to call back with questions or concerns? Yes  SDOH assessments and interventions completed:   Yes  Care Coordination Interventions Activated:  Yes   Care Coordination Interventions:  No Care Coordination interventions needed at this time.   Encounter Outcome:  Pt. Visit Completed    [facility] [phone]

## 2022-08-15 NOTE — Telephone Encounter (Signed)
Patient seen in office today by Melina Copa, PA-C.  Thanks!

## 2022-08-15 NOTE — Patient Instructions (Addendum)
Medication Instructions:   Your physician recommends that you continue on your current medications as directed. Please refer to the Current Medication list given to you today.  *If you need a refill on your cardiac medications before your next appointment, please call your pharmacy*   Lab Work:  BMET BNP AND TSH TODAY   If you have labs (blood work) drawn today and your tests are completely normal, you will receive your results only by: [place] (if you have MyChart) OR A paper copy in the mail If you have any lab test that is abnormal or we need to change your treatment, we will call you to review the results.   Testing/Procedures: Your physician has requested that you have an echocardiogram. Echocardiography is a painless test that uses sound waves to create images of your heart. It provides your doctor with information about the size and shape of your heart and how well your heart's chambers and valves are working. This procedure takes approximately one hour. There are no restrictions for this procedure. Please do NOT wear cologne, perfume, aftershave, or lotions (deodorant is allowed). Please arrive 15 minutes prior to your appointment time.   Your physician has requested that you have a carotid duplex. This test is an ultrasound of the carotid arteries in your neck. It looks at blood flow through these arteries that supply the brain with blood. Allow one hour for this exam. There are no restrictions or special instructions.   Your physician has requested that you have a lower or upper extremity arterial duplex. This test is an ultrasound of the arteries in the legs or arms. It looks at arterial blood flow in the legs and arms. Allow one hour for Lower and Upper Arterial scans. There are no restrictions or special instructions .   Follow-Up: At [facility], you and your health needs are our priority.  As part of our continuing mission to provide you with exceptional  heart care, we have created designated Provider Care Teams.  These Care Teams include your primary Cardiologist (physician) and Advanced Practice Providers (APPs -  Physician Assistants and Nurse Practitioners) who all work together to provide you with the care you need, when you need it.  We recommend signing up for the patient portal called "MyChart".  Sign up information is provided on this After Visit Summary.  MyChart is used to connect with patients for Virtual Visits (Telemedicine).  Patients are able to view lab/test results, encounter notes, upcoming appointments, etc.  Non-urgent messages can be sent to your provider as well.   To learn more about what you can do with MyChart, go to [email or web address].    Your next appointment:  AS SCHEDULED    The format for your next appointment:   In Person   Provider:  DR. [name]    Other Instructions   Important Information About Sugar      For patients with congestive heart failure, we give them these special instructions:  1. Follow a low-salt diet - you should be eating no more than 2,'000mg'$  of sodium per day. This does not necessarily just apply to the salt you put on top of prepared food, but the sodium already in food. Processed food, frozen meals, canned goods, deli meat, and bread can have a surprising amount of sodium per serving so be sure to track this daily. 2. Watch your fluid intake. In general, you should not be taking in more than 2 liters of fluid per day (close  to 64 oz of fluid per day). This includes sources of water in foods like soup, coffee, tea, milk, etc. It's important to stay hydrated but NOT to excess. 2. Weigh yourself on the same scale at same time of day and keep a log. 3. Call your doctor: (Anytime you feel any of the following symptoms)  - 3lb weight gain overnight or 5lb within a few days - Shortness of breath, with or without a dry hacking cough  - Swelling in the hands, feet or stomach  - If you  have to sleep on extra pillows at night in order to breathe   IT IS IMPORTANT TO LET YOUR DOCTOR KNOW EARLY ON IF YOU ARE HAVING SYMPTOMS SO WE CAN HELP YOU!

## 2022-08-15 NOTE — Progress Notes (Signed)
Brachial Index (ABI) - do not check if billed separately Has the patient been seen at the hospital within the last three years: Yes Total Score: 0 Level Of Care: ____ Electronic Signature(s) Signed: 08/14/2022 5:15:48 PM By: Massie Kluver Entered By: Massie Kluver on 08/13/2022 12:14:47 -------------------------------------------------------------------------------- Compression Therapy Details Patient Name: Date of Service: [name], [name] 08/13/2022 11:15 [name] M Medical Record Number: [id number] Patient Account Number: [id number] Date of Birth/Sex: Treating RN: Jun 08, 1946 (76 y.o. [name] Primary Care Kynzli Rease: [name] Other Clinician: Massie Kluver Referring Kionna Brier: Treating Jacksyn Beeks/Extender: Eldridge Dace, Arbuckle EL Annye English in Treatment: [place] ([id number]) [id number] Page 3 of 6 Compression Therapy Performed for Wound Assessment: Wound #10 Left,Medial Lower Leg Performed By: Lenice Pressman, Angie, Compression Type: Three Layer Pre Treatment ABI: 0.8 Electronic Signature(s) Signed: 08/14/2022 5:15:48 PM By: Massie Kluver Entered By: Massie Kluver on 08/13/2022 12:13:23 -------------------------------------------------------------------------------- Compression Therapy Details Patient Name: Date of Service: [name], [name] 08/13/2022 11:15 [name] M Medical Record Number: [id number] Patient Account Number: [id number] Date of Birth/Sex: Treating RN: Jun 06, 1946 (76 y.o. [name] Primary Care T[place]ia Harral: [name] Other Clinician: Massie Kluver Referring Sharese Manrique: Treating Kalilah Barua/Extender: [name], MICHA EL Annye English in Treatment: 1 Compression Therapy Performed for Wound Assessment: Wound #12 Right,Medial Lower Leg Performed By: Lenice Pressman, Angie, Compression Type: Three Layer Pre  Treatment ABI: 0.8 Electronic Signature(s) Signed: 08/14/2022 5:15:48 PM By: Massie Kluver Entered By: Massie Kluver on 08/13/2022 12:13:59 -------------------------------------------------------------------------------- Encounter Discharge Information Details Patient Name: Date of Service: [name], [name] 08/13/2022 11:15 [name] M Medical Record Number: [id number] Patient Account Number: [id number] Date of Birth/Sex: Treating RN: Aug 26, 1946 (76 y.o. [name] Primary Care Chester Romero: [name] Other Clinician: Massie Kluver Referring Teandra Harlan: Treating Dayshaun Whobrey/Extender: [name], MICHA EL Annye English in Treatment: 1 Encounter Discharge Information Items Discharge Condition: Stable Ambulatory Status: Ambulatory Discharge Destination: Home Transportation: Private Auto Accompanied By: wife Schedule Follow-up Appointment: Yes Clinical Summary of Care: Electronic Signature(s) [name], [name] ([id number]) [id number] Page 4 of 6 Signed: 08/14/2022 5:15:48 PM By: Massie Kluver Entered By: Massie Kluver on 08/13/2022 12:14:41 -------------------------------------------------------------------------------- Wound Assessment Details Patient Name: Date of Service: [name] Ready [name] 08/13/2022 11:15 [name] M Medical Record Number: [id number] Patient Account Number: [id number] Date of Birth/Sex: Treating RN: 03/24/1946 (76 y.o. [name], [name] Primary Care Karsin Pesta: [name] Other Clinician: Massie Kluver Referring Aaran Enberg: Treating Tayla Panozzo/Extender: Eldridge Dace, MICHA EL Annye English in Treatment: 1 Wound Status Wound Number: 10 Primary Etiology: Venous Leg Ulcer Wound Location: Left, Medial Lower Leg Wound Status: Open Wounding Event: Gradually Appeared Comorbid History: Arrhythmia, Hypertension, Gout Date Acquired: 01/20/2022 Weeks Of Treatment: 1 Clustered Wound: No Wound Measurements Length: (cm) 1 Width: (cm)  1.6 Depth: (cm) 0.2 Area: (cm) 1.257 Volume: (cm) 0.251 % Reduction in Area: 18% % Reduction in Volume: 18% Wound Description Classification: Full Thickness Without Exposed Suppor Exudate Amount: Medium Exudate Type: Serous Exudate Color: amber t Structures Treatment Notes Wound #10 (Lower Leg) Wound Laterality: Left, Medial Cleanser Soap and Water Discharge Instruction: Gently cleanse wound with antibacterial soap, rinse and pat dry prior to dressing wounds [place] Discharge Instruction: Suggestions: Theraderm, Eucerin, Cetaphil, or patient preference. Topical Primary Dressing Silvercel Small 2x2 (in/in) Discharge Instruction: Apply Silvercel Small 2x2 (in/in) as instructed Secondary Dressing ABD Pad 5x9 (in/in) Discharge Instruction: Cover with ABD pad Secured With  Brachial Index (ABI) - do not check if billed separately Has the patient been seen at the hospital within the last three years: Yes Total Score: 0 Level Of Care: ____ Electronic Signature(s) Signed: 08/14/2022 5:15:48 PM By: Massie Kluver Entered By: Massie Kluver on 08/13/2022 12:14:47 -------------------------------------------------------------------------------- Compression Therapy Details Patient Name: Date of Service: [name], [name] 08/13/2022 11:15 [name] M Medical Record Number: [id number] Patient Account Number: [id number] Date of Birth/Sex: Treating RN: Jun 08, 1946 (76 y.o. [name] Primary Care Kynzli Rease: [name] Other Clinician: Massie Kluver Referring Kionna Brier: Treating Jacksyn Beeks/Extender: Eldridge Dace, Arbuckle EL Annye English in Treatment: [place] ([id number]) [id number] Page 3 of 6 Compression Therapy Performed for Wound Assessment: Wound #10 Left,Medial Lower Leg Performed By: Lenice Pressman, Angie, Compression Type: Three Layer Pre Treatment ABI: 0.8 Electronic Signature(s) Signed: 08/14/2022 5:15:48 PM By: Massie Kluver Entered By: Massie Kluver on 08/13/2022 12:13:23 -------------------------------------------------------------------------------- Compression Therapy Details Patient Name: Date of Service: [name], [name] 08/13/2022 11:15 [name] M Medical Record Number: [id number] Patient Account Number: [id number] Date of Birth/Sex: Treating RN: Jun 06, 1946 (76 y.o. [name] Primary Care [place]: [name] Other Clinician: Massie Kluver Referring Sharese Manrique: Treating [place]il[place] Barua/Extender: [name], MICHA EL Annye English in Treatment: 1 Compression Therapy Performed for Wound Assessment: Wound #12 Right,Medial Lower Leg Performed By: Lenice Pressman, Angie, Compression Type: Three Layer Pre  Treatment ABI: 0.8 Electronic Signature(s) Signed: 08/14/2022 5:15:48 PM By: Massie Kluver Entered By: Massie Kluver on 08/13/2022 12:13:59 -------------------------------------------------------------------------------- Encounter Discharge Information Details Patient Name: Date of Service: [name], [name] 08/13/2022 11:15 [name] M Medical Record Number: [id number] Patient Account Number: [id number] Date of Birth/Sex: Treating RN: Aug 26, 1946 (76 y.o. [name] Primary Care Chester Romero: [name] Other Clinician: Massie Kluver Referring Teandra Harlan: Treating Dayshaun Whobrey/Extender: [name], MICHA EL Annye English in Treatment: 1 Encounter Discharge Information Items Discharge Condition: Stable Ambulatory Status: Ambulatory Discharge Destination: Home Transportation: Private Auto Accompanied By: wife Schedule Follow-up Appointment: Yes Clinical Summary of Care: Electronic Signature(s) [name], [name] ([id number]) [id number] Page 4 of 6 Signed: 08/14/2022 5:15:48 PM By: Massie Kluver Entered By: Massie Kluver on 08/13/2022 12:14:41 -------------------------------------------------------------------------------- Wound Assessment Details Patient Name: Date of Service: [name] Ready [name] 08/13/2022 11:15 [name] M Medical Record Number: [id number] Patient Account Number: [id number] Date of Birth/Sex: Treating RN: 03/24/1946 (76 y.o. [name], [name] Primary Care Karsin Pesta: [name] Other Clinician: Massie Kluver Referring Aaran Enberg: Treating Tayla Panozzo/Extender: Eldridge Dace, MICHA EL Annye English in Treatment: 1 Wound Status Wound Number: 10 Primary Etiology: Venous Leg Ulcer Wound Location: Left, Medial Lower Leg Wound Status: Open Wounding Event: Gradually Appeared Comorbid History: Arrhythmia, Hypertension, Gout Date Acquired: 01/20/2022 Weeks Of Treatment: 1 Clustered Wound: No Wound Measurements Length: (cm) 1 Width: (cm)  1.6 Depth: (cm) 0.2 Area: (cm) 1.257 Volume: (cm) 0.251 % Reduction in Area: 18% % Reduction in Volume: 18% Wound Description Classification: Full Thickness Without Exposed Suppor Exudate Amount: Medium Exudate Type: Serous Exudate Color: amber t Structures Treatment Notes Wound #10 (Lower Leg) Wound Laterality: Left, Medial Cleanser Soap and Water Discharge Instruction: Gently cleanse wound with antibacterial soap, rinse and pat dry prior to dressing wounds [place] Discharge Instruction: Suggestions: Theraderm, Eucerin, Cetaphil, or patient preference. Topical Primary Dressing Silvercel Small 2x2 (in/in) Discharge Instruction: Apply Silvercel Small 2x2 (in/in) as instructed Secondary Dressing ABD Pad 5x9 (in/in) Discharge Instruction: Cover with ABD pad Secured With  JONELL, KRONTZ [name] ([id number]) [id number] Page 1 of 6 Visit Report for 08/13/2022 Arrival Information Details Patient Name: Date of Service: [name] 08/13/2022 11:15 [name] M Medical Record Number: [id number] Patient Account Number: [id number] Date of Birth/Sex: Treating RN: 06-Mar-1946 (76 y.o. [name], [name] Primary Care Maxene Byington: [name] Other Clinician: Massie Kluver Referring Sabiha Sura: Treating Griselle Rufer/Extender: Eldridge Dace, MICHA EL Annye English in Treatment: 1 Visit Information History Since Last Visit All ordered tests and consults were completed: No Patient Arrived: Ambulatory Added or deleted any medications: No Arrival Time: 11:35 Any new allergies or adverse reactions: No Transfer Assistance: None Had [name] fall or experienced change in No Patient Requires Transmission-Based No activities of daily living that may affect Precautions: risk of falls: Patient Has Alerts: Yes Signs or symptoms of abuse/neglect since last visito No Patient Alerts: Patient on Blood Thinner Hospitalized since last visit: No 12/13/21 TBI [name])0.75 L)0.72 Lauree Chandler; NOT diabetic Implantable device outside of the clinic excluding No HISTORY OF SKIN cellular tissue based products placed in the center CANCERS since last visit: Pain Present Now: No Electronic Signature(s) Signed: 08/14/2022 5:15:48 PM By: Massie Kluver Entered By: Massie Kluver on 08/13/2022 12:11:27 -------------------------------------------------------------------------------- Clinic Level of Care Assessment Details Patient Name: Date of Service: [name] 08/13/2022 11:15 [name] M Medical Record Number: [id number] Patient Account Number: [id number] Date of Birth/Sex: Treating RN: [birth year] (76 y.o. [name] Primary Care Colburn Asper: [name] Other Clinician: Massie Kluver Referring Vir Whetstine: Treating Esmirna Ravan/Extender: [name], MICHA EL Annye English in  Treatment: 1 Clinic Level of Care Assessment Items TOOL 1 Quantity Score '[]'$  - 0 Use when EandM and Procedure is performed on INITIAL visit ASSESSMENTS - Nursing Assessment / Reassessment '[]'$  - 0 General Physical Exam (combine w/ comprehensive assessment (listed just below) when performed on new pt. evals) '[]'$  - 0 Comprehensive Assessment (HX, ROS, Risk Assessments, Wounds Hx, etc.) AKIF, WELDY [name] ([id number]) [id number] Page 2 of 6 ASSESSMENTS - Wound and Skin Assessment / Reassessment '[]'$  - 0 Dermatologic / Skin Assessment (not related to wound area) ASSESSMENTS - Ostomy and/or Continence Assessment and Care '[]'$  - 0 Incontinence Assessment and Management '[]'$  - 0 Ostomy Care Assessment and Management (repouching, etc.) PROCESS - Coordination of Care '[]'$  - 0 Simple Patient / Family Education for ongoing care '[]'$  - 0 Complex (extensive) Patient / Family Education for ongoing care '[]'$  - 0 Staff obtains Programmer, systems, Records, T Results / Process Orders est '[]'$  - 0 Staff telephones HHA, Nursing Homes / Clarify orders / etc '[]'$  - 0 Routine Transfer to another Facility (non-emergent condition) '[]'$  - 0 Routine Hospital Admission (non-emergent condition) '[]'$  - 0 New Admissions / Biomedical engineer / Ordering NPWT Apligraf, etc. , '[]'$  - 0 Emergency Hospital Admission (emergent condition) PROCESS - Special Needs '[]'$  - 0 Pediatric / Minor Patient Management '[]'$  - 0 Isolation Patient Management '[]'$  - 0 Hearing / Language / Visual special needs '[]'$  - 0 Assessment of Community assistance (transportation, [name], etc.) '[]'$  - 0 Additional assistance / Altered mentation '[]'$  - 0 Support Surface(s) Assessment (bed, cushion, seat, etc.) INTERVENTIONS - Miscellaneous '[]'$  - 0 External ear exam '[]'$  - 0 Patient Transfer (multiple staff / Civil Service fast streamer / Similar devices) '[]'$  - 0 Simple Staple / Suture removal (25 or less) '[]'$  - 0 Complex Staple / Suture removal (26 or  more) '[]'$  - 0 Hypo/Hyperglycemic Management (do not check if billed separately) '[]'$  - 0 Ankle /

## 2022-08-16 ENCOUNTER — Encounter: Payer: Medicare PPO | Admitting: Internal Medicine

## 2022-08-16 DIAGNOSIS — Z85828 Personal history of other malignant neoplasm of skin: Secondary | ICD-10-CM | POA: Diagnosis not present

## 2022-08-16 DIAGNOSIS — L97812 Non-pressure chronic ulcer of other part of right lower leg with fat layer exposed: Secondary | ICD-10-CM | POA: Diagnosis not present

## 2022-08-16 DIAGNOSIS — I87333 Chronic venous hypertension (idiopathic) with ulcer and inflammation of bilateral lower extremity: Secondary | ICD-10-CM | POA: Diagnosis not present

## 2022-08-16 DIAGNOSIS — I480 Paroxysmal atrial fibrillation: Secondary | ICD-10-CM | POA: Diagnosis not present

## 2022-08-16 DIAGNOSIS — S81802A Unspecified open wound, left lower leg, initial encounter: Secondary | ICD-10-CM | POA: Diagnosis not present

## 2022-08-16 DIAGNOSIS — I7389 Other specified peripheral vascular diseases: Secondary | ICD-10-CM | POA: Diagnosis not present

## 2022-08-16 DIAGNOSIS — S81801A Unspecified open wound, right lower leg, initial encounter: Secondary | ICD-10-CM | POA: Diagnosis not present

## 2022-08-16 DIAGNOSIS — I10 Essential (primary) hypertension: Secondary | ICD-10-CM | POA: Diagnosis not present

## 2022-08-16 DIAGNOSIS — L97822 Non-pressure chronic ulcer of other part of left lower leg with fat layer exposed: Secondary | ICD-10-CM | POA: Diagnosis not present

## 2022-08-16 LAB — BASIC METABOLIC PANEL
BUN/Creatinine Ratio: 16 (ref 10–24)
BUN: 15 mg/dL (ref 8–27)
CO2: 28 mmol/L (ref 20–29)
Calcium: 10.3 mg/dL — ABNORMAL HIGH (ref 8.6–10.2)
Chloride: 103 mmol/L (ref 96–106)
Creatinine, Ser: 0.93 mg/dL (ref 0.76–1.27)
Glucose: 93 mg/dL (ref 70–99)
Potassium: 4.3 mmol/L (ref 3.5–5.2)
Sodium: 143 mmol/L (ref 134–144)
eGFR: 86 mL/min/{1.73_m2} (ref >59)

## 2022-08-16 LAB — TSH: TSH: 5.05 u[IU]/mL — ABNORMAL HIGH (ref 0.450–4.500)

## 2022-08-16 LAB — PRO B NATRIURETIC PEPTIDE: NT-Pro BNP: 1336 pg/mL — ABNORMAL HIGH (ref 0–376)

## 2022-08-16 NOTE — Progress Notes (Addendum)
[name], [name] ([id number]) [id number] Page 1 of 9 Visit Report for 08/16/2022 Chief Complaint Document Details Patient Name: Date of Service: [name] 08/16/2022 10:45 [name] M Medical Record Number: [id number] Patient Account Number: [id number] Date of Birth/Sex: Treating RN: 1946/05/23 (76 y.o. [name] Primary Care Provider: [name] Other Clinician: Referring Provider: Treating Provider/Extender: [name], MICHA EL Annye English in Treatment: 2 Information Obtained from: Patient Chief Complaint Bilateral LE Ulcers Electronic Signature(s) Signed: 08/16/2022 4:18:14 PM By: [name] MSN RN CNS WTA Signed: 08/17/2022 12:57:19 PM By: Linton Ham MD Entered By: [name] on 08/16/2022 16:18:14 -------------------------------------------------------------------------------- HPI Details Patient Name: Date of Service: [name], [name] 08/16/2022 10:45 [name] M Medical Record Number: [id number] Patient Account Number: [id number] Date of Birth/Sex: Treating RN: 1946-04-08 (76 y.o. [name] Primary Care Provider: [name] Other Clinician: Referring Provider: Treating Provider/Extender: Eldridge Dace, MICHA EL Annye English in Treatment: 2 History of Present Illness HPI Description: 76 year old male who has [name] past medical history of essential hypertension, chronic atrial fibrillation, peripheral vascular disease, nonischemic cardiomyopathy,venous stasis dermatitis, gouty arthropathy, basal cell carcinoma of the right lower extremity, benign prostatic hypertrophy, long- term use of anticoagulation therapy, hyperglycemia and exercise intolerance has never been [name] smoker. the patient has had [name] vascular workup over 7 years ago and said everything was normal at that stage. He does not have any chronic problems except for cardiac issues which he sees [name] cardiologist in [place]. 08/15/2017 -- arterial and venous  duplex studies still pending. 08/23/2017 -- venous reflux studies done on 08/13/2017 shows venous incompetence throughout the left lower extremity deep system and focally at the left saphenofemoral junction. 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 No evidence of SVT or DVT in bilateral lower extremities The patient has an appointment at the end of the month to get his arterial duplex study done 09/05/2017 -- the patient was seen at the vein and vascular office yesterday by Angelena Form. ABI studies were notable for medial calcification and the toe brachial indices were normal and bilateral ankle-brachial) waveforms were normal with triphasic flow. After review of his venous studies he was not [name] candidate for laser ablation and his lymphedema was to be treated with compression stockings and lymphedema pump pumps JSOEPH, PODESTA [name] ([id number]) [id number] Page 2 of 9 09/12/2017 -- had [name] low arterial study done at the [facility] -- unable to obtain reliable ABI is due to medial calcification. Bilateral toe indices were normal with the right being 1.01 and the left being 0.92 and the waveforms were triphasic bilaterally. he did get hold of 30-40 mm compression stockings but is unable to put these on. We will try and get him alternative compression stockings. 09/26/17- he is here in follow up evaluation of [name] right lower extremity ulcer;he is compliant in wearing compression stocking; ulcer almost epithelialized , anticipate healing next appointment Readmission: 11/17 point upon evaluation patient's wound currently that he is seeing [place] for today is [name] skin cancerous lesion that was cleared away by his dermatologist on the left medial calf region. He tells me that this is [name] very similar thing to what he had done previously in fact the last time he saw him in 2018 this was also what was going on at that point. Nonetheless he feels that  based on what he seeing currently that this is just having [name] lot of  harder time healing although it is much closer to the surface than what he is experienced in the past. He notes that the initial removal was in June 2022 which was this year this is now November and still has not closed. He does have some edema and definitely I think that there is some venous component to his slow healing here. Also think that we can do something better than Vaseline to try to help with getting this to clear up as quickly as possible. He does have [name] history of atrial fibrillation and is on Eliquis otherwise he really has no major medical problems that would affect wound healing. 09/07/2021 upon evaluation today patient actually appears to be doing significantly better after having wrapped him last week. Overall I think that this is making significant improvements at this time which is great news. I do not see any evidence of infection which is great news as well. No fevers, chills, nausea, vomiting, or diarrhea. 09/14/2021 upon evaluation today patient appears to be doing well with regard to his leg ulcer. He has been tolerating the dressing changes and overall I think that he is making excellent progress. I do not see any signs of active infection at this time. 09/21/2021 upon evaluation today patient actually appears to be making good progress with regard to his wound this is again measuring smaller today no debridement seems to be necessary. We have been using [name] silver collagen dressing and I think that is doing an awesome job. 09/28/2021 upon evaluation today patient appears to be doing well with regard to his leg currently. I do not see any signs of active infection at this time which is great news. No fevers, chills, nausea, vomiting, or diarrhea. I think this wound is very close to complete resolution. 10/12/2021 upon evaluation today patient actually appears to be doing awesome in regard to his leg ulcer. In fact  this appears to be completely healed based on what I am seeing currently. I do not see any evidence of active infection locally nor systemically at this time which is also great news. No fevers, chills, nausea, vomiting, or diarrhea. Readmission: 12/07/2021 upon evaluation today patient presents for readmission here in the clinic. He was discharged on 10/12/2021 is completely healed. Unfortunately this has reopened at this point and he is having continual issues with new blisters over both lower extremities. This is even worse than what we previously saw. Nonetheless we did actually check his ABIs today and it did reveal that his ABIs were 0.55 on the left and 0.57 on the right. Subsequently this is [name] definite change from his last arterial study which showed that he did have good blood flow at 1.01 on the right and 0.92 on the left and that was right at the beginning of 2019. Nonetheless based on what we see currently I do think he tolerated the 3 layer compression wrap but I do believe that we probably need to get him tested for his arterial flow in order to see where things stand and if there is something we can do there that would help prevent this from continue to be an ongoing issue. He did not utilize compression socks in the interim from when he was last here till this time. That something is probably going to need lifelong going forward as well. 3/9; patient presents for follow-up. He has no issues or complaints today. He tolerated the compression wrap well. He had ABIs with TBI's done. He denies signs of infection.  12/21/2021 upon evaluation today patient appears to be doing well with regard to the wounds on his legs. Both are showing signs of significant improvement which is great news although I do believe some sharp debridement would be of benefit here as well. 12/28/2021 upon evaluation today patient appears to be doing well with regard to his wounds. Everything is showing signs of excellent  improvement which I am very pleased about. I think that we are headed in the right direction here. Fortunately there does not appear to be any evidence of infection which is great news there is [name] little bit of hypergranulation. 01/04/2022 upon evaluation today patient appears to be doing well with regard to his wounds 2 of them are healed 1 is almost so and the other 1 is significantly better. Overall I am extremely pleased with where we stand and I think that he is making excellent progress here. I do not see any evidence of active infection locally nor systemically at this time. 01-16-2022 upon evaluation today patient's wound on the left leg is showing signs of doing quite well. Has not completely cleared at this point but it is much improved. Fortunately I do not see any signs of infection at this time. No fevers, chills, nausea, vomiting, or diarrhea. 01-23-2022 upon evaluation today patient's wound of the left leg actually appears to be pretty much completely healed which is great news. I do not see any signs of active infection locally or systemically which is excellent. With that being said on the right leg what wound is measuring smaller the other 1 is [name] new wound that just showed up fortunately its not too bad. Has been using Xeroform here and that seems to be doing decently well which is great news. Unfortunately his blood pressure is significantly high we gave him the readings for the past 4-5 visits as well as [name] recommendation to make an appointment to go discuss this with his primary care provider patient states that he is going to look into doing this. 01-30-2022 upon evaluation today patient appears to be doing well with regard to his left leg everything appears to be healed. On the right leg the more anterior wound is healed the more medial wound that I been concerned about [name] possible skin cancer unfortunately still does not look great to me. I do believe that we should probably do [name]  biopsy I have talked about it with him [name] few times I think though it is probably time to go ahead and do this at this point. 02-09-2022 upon evaluation today patient appears to be doing well with regard to his legs. On the left this appears to be completely healed. On the right he does have 2 areas and be perfectly honest one of them is [name] skin cancer that he is going to the Mohs surgery clinic for the other seems to be healing nicely. Readmission: 08-02-2022 upon evaluation today patient appears for reevaluation here in our clinic concerning issues that he has been having with wounds over the bilateral lower extremities. I last saw him in May 2023 and at that point we had him completely healed. Unfortunately he is tells me this has broken down to some degree since that point. 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 He  tells me this 1 seems to just be getting bigger and bigger not improving. Fortunately he is not having any significant pain which is good news he does have quite [name] bit of swelling and he tells me that his fluid pills are not recommended for him to take daily but just in 3-day intervals here and there. 08-09-2022 upon evaluation today patient appears to be doing still somewhat poorly in regard to his legs although in general he does not appear to be feeling as good as he has been. Fortunately there does not appear to be any signs of infection which is good news. With that being said he is having some issues here with having and overall poor feeling in general which again is good I think going to be the biggest complicating factor. He actually seems to be coughing I do not hear any wheezing right now I did listen to his chest he did not have good airflow down low however makes me suspicious  for bronchitis or even possibly pneumonia which could be part of what is going on here as well. Fortunately I do not see any evidence of anything worsening in regard to his legs but I definitely believe that he needs to continue with the compression wraps he took them off yesterday to shower has not had anything on for 24 hours that is why his legs are so swollen today. [name], [name] ([id number]) [id number] Page 3 of 9 With regard to his pathology report I did review that it showed some squamous abnormality but no signs of distinct carcinoma. With that being said it was saying that it could be adjacent to [name] squamous cell carcinoma nonetheless my suggestion is can be that we have the patient take copy of this report and give it to his Mohs surgeon in order for them to see if there is anything they feel like needs to be done further. With that being said right now I feel like the primary thing is going to be for [place] to try to get his swelling down and keep that down into that hand since he is having so much drainage I believe we can have to bring him in for dressing changes twice [name] week doing [name] nurse visit on Mondays. 11/9; since the patient was last here he spent the night in the emergency room he received IV Lasix. Also received antibiotics although he was not discharged on either 1 of these. He also saw his cardiology office who put him on regular Lasix 20 mg [previously on as needed Lasix 20 mg]. Per our intake nurse the swelling in his legs is remarkably better but he still has bilateral lower extremity wounds. He still has wounds on the bilateral lower extremities most problematically on the left lateral calf. He has been using silver alginate under 3 layer compression. Electronic Signature(s) Signed: 08/16/2022 3:56:39 PM By: Linton Ham MD Entered By: Linton Ham on 08/16/2022  11:52:06 -------------------------------------------------------------------------------- Physical Exam Details Patient Name: Date of Service: [name], [name] 08/16/2022 10:45 [name] M Medical Record Number: [id number] Patient Account Number: [id number] Date of Birth/Sex: Treating RN: November 04, 1945 (76 y.o. [name] Primary Care Provider: [name] Other Clinician: Referring Provider: Treating Provider/Extender: [name], MICHA EL Annye English in Treatment: 2 Constitutional Patient is hypertensive.. Pulse regular and within target range for patient.Marland Kitchen Respirations regular, non-labored and within target range.. Temperature is normal and within the target range for the patient.Marland Kitchen appears in no distress.  Cardiovascular . Notes Wound exam; the patient's largest areas on the left lateral calf under illumination it looks fairly stable I did not debride this today. There is smaller areas on the left anterior 2 small areas on the right as well. No debridement was done. The edema control in his legs is [name] lot better he still has very dry scaly skin bilaterally. Edema control is much better wound exam; his legs look [name] lot better although these still have dry fissured flaking skin. Wounds look dry Electronic Signature(s) Signed: 08/16/2022 3:56:39 PM By: Linton Ham MD Entered By: Linton Ham on 08/16/2022 11:58:55 -------------------------------------------------------------------------------- Physician Orders Details Patient Name: Date of Service: [name], [name] 08/16/2022 10:45 [name] M Medical Record Number: [id number] Patient Account Number: [id number] Date of Birth/Sex: Treating RN: 07-May-1946 (76 y.o. 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 No tub bath. Anesthetic (Use 'Patient Medications' Section for Anesthetic Order Entry) Wound #9 Left,Lateral Lower Leg Injectable lidocaine applied before procedure. Edema Control - Lymphedema / Segmental Compressive Device / Other Bilateral Lower Extremities Elevate, Exercise Daily and [name] void Standing for Long Periods of Time. Elevate legs to the level of the heart and pump ankles as often as possible Elevate leg(s) parallel to the floor when sitting. DO YOUR BEST to sleep in the bed at night. DO NOT sleep in your recliner. Long hours of sitting in [name] recliner leads to swelling of the legs and/or potential wounds on your backside. Additional Orders / Instructions Follow Nutritious Diet and Increase Protein Intake Wound Treatment Wound #10 - Lower Leg Wound Laterality: Left, Medial Cleanser: Soap and Water 1 x Per Week/15 Days Discharge Instructions: Gently cleanse wound with antibacterial soap, rinse and pat dry prior to dressing wounds Peri-Wound Care: Moisturizing Lotion 1 x Per Week/15 Days Discharge Instructions: Suggestions: Theraderm, Eucerin, Cetaphil, or patient preference. Prim Dressing: Silvercel Small 2x2 (in/in) (Generic) 1 x Per Week/15 Days ary Discharge Instructions: Apply Silvercel Small 2x2  (in/in) as instructed Secondary Dressing: ABD Pad 5x9 (in/in) 1 x Per Week/15 Days Discharge Instructions: Cover with ABD pad Compression Wrap: 3-LAYER WRAP - Profore Lite LF 3 Multilayer Compression Bandaging System 1 x Per Week/15 Days Discharge Instructions: Apply 3 multi-layer wrap as prescribed. Wound #12 - Lower Leg Wound Laterality: Right, Medial Cleanser: Soap and Water 1 x Per Week/15 Days Discharge Instructions: Gently cleanse wound with antibacterial soap, rinse and pat dry prior to dressing wounds Peri-Wound Care: Moisturizing Lotion 1 x Per Week/15 Days Discharge Instructions: Suggestions: Theraderm, Eucerin, Cetaphil, or patient preference. Prim Dressing: Silvercel Small 2x2 (in/in) (Generic) 1 x Per Week/15 Days ary Discharge Instructions: Apply Silvercel Small 2x2 (in/in) as instructed Secondary Dressing: ABD Pad 5x9 (in/in) 1 x Per Week/15 Days Discharge Instructions: Cover with ABD  pad Compression Wrap: 3-LAYER WRAP - Profore Lite LF 3 Multilayer Compression Bandaging System 1 x Per Week/15 Days Discharge Instructions: Apply 3 multi-layer wrap as prescribed. Electronic Signature(s) Signed: 08/16/2022 4:15:36 PM By: [name] MSN RN CNS WTA Signed: 08/17/2022 12:57:19 PM By: Linton Ham MD [name] ([id number]) [id number] Page 5 of 9 Entered By: [name] on 08/16/2022 16:15:35 -------------------------------------------------------------------------------- Problem List Details Patient Name: Date of Service: [name] 08/16/2022 10:45 [name] M Medical Record Number: [id number] Patient Account Number: [id number] Date of Birth/Sex: Treating RN: [birth year] (76 y.o. [name] Primary Care Provider: [name] Other Clinician: Referring Provider: Treating Provider/Extender: Eldridge Dace, MICHA EL Annye English in Treatment: 2 Active Problems ICD-10 Encounter Code Description Active Date  MDM Diagnosis I87.333 Chronic venous hypertension (idiopathic) with ulcer and inflammation of 08/02/2022 No Yes bilateral lower extremity L97.822 Non-pressure chronic ulcer of other part of left lower leg with fat layer 08/02/2022 No Yes exposed L97.812 Non-pressure chronic ulcer of other part of right lower leg with fat layer 08/02/2022 No Yes exposed I73.89 Other specified peripheral vascular diseases 08/02/2022 No Yes I48.0 Paroxysmal atrial fibrillation 08/02/2022 No Yes I10 Essential (primary) hypertension 08/02/2022 No Yes Inactive Problems Resolved Problems Electronic Signature(s) Signed: 08/16/2022 4:18:04 PM By: [name] MSN RN CNS WTA Signed: 08/17/2022 12:57:19 PM By: Linton Ham MD Previous Signature: 08/16/2022 3:56:39 PM Version By: Linton Ham MD Entered By: [name] on 08/16/2022 Wisdom, [place] ([id number]) [id number] Page 6 of 9 -------------------------------------------------------------------------------- Progress Note Details Patient Name: Date of Service: [name] 08/16/2022 10:45 [name] M Medical Record Number: [id number] Patient Account Number: [id number] Date of Birth/Sex: Treating RN: September 17, 1946 (76 y.o. [name] Primary Care Provider: [name] Other Clinician: Referring Provider: Treating Provider/Extender: Eldridge Dace, MICHA EL Annye English in Treatment: 2 Subjective History of Present Illness (HPI) 76 year old male who has [name] past medical history of essential hypertension, chronic atrial fibrillation, peripheral vascular disease, nonischemic cardiomyopathy,venous stasis dermatitis, gouty arthropathy, basal cell carcinoma of the right lower extremity, benign prostatic hypertrophy, long-term use of anticoagulation therapy, hyperglycemia and exercise intolerance has never been [name] smoker. the patient has had [name] vascular workup over 7 years ago and said everything was normal at that  stage. He does not have any chronic problems except for cardiac issues which he sees [name] cardiologist in [place]. 08/15/2017 -- arterial and venous duplex studies still pending. 08/23/2017 -- venous reflux studies done on 08/13/2017 shows venous incompetence throughout the left lower extremity deep system and focally at the left saphenofemoral junction. 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 No evidence of SVT or DVT in bilateral lower extremities The patient has an appointment at the end of the month to get his arterial duplex study done 09/05/2017 -- the patient was seen at the vein and vascular office yesterday by Angelena Form. ABI studies were notable for medial calcification and the toe brachial indices were normal and bilateral ankle-brachial) waveforms were normal with triphasic flow. After review of his venous studies he was not [name] candidate for laser ablation and his lymphedema was to be treated with compression stockings and lymphedema pump pumps 09/12/2017 -- had [name] low arterial study done at the [facility] -- unable to obtain reliable ABI is due to medial calcification. Bilateral toe indices were normal with the right being 1.01 and the left being 0.92 and the waveforms were triphasic  bilaterally. he did get hold of 30-40 mm compression stockings but is unable to put these on. We will try and get him alternative compression stockings. 09/26/17- he is here in follow up evaluation of [name] right lower extremity ulcer;he is compliant in wearing compression stocking; ulcer almost epithelialized , anticipate healing next appointment Readmission: 11/17 point upon evaluation patient's wound currently that he is seeing [place] for today is [name] skin cancerous lesion that was cleared away by his dermatologist on the left medial calf region. He tells me that this is [name] very similar thing to what he had done previously in fact the last time he saw him in 2018 this  was also what was going on at that point. Nonetheless he feels that based on what he seeing currently that this is just having [name] lot of harder time healing although it is much closer to the surface than what he is experienced in the past. He notes that the initial removal was in June 2022 which was this year this is now November and still has not closed. He does have some edema and definitely I think that there is some venous component to his slow healing here. Also think that we can do something better than Vaseline to try to help with getting this to clear up as quickly as possible. He does have [name] history of atrial fibrillation and is on Eliquis otherwise he really has no major medical problems that would affect wound healing. 09/07/2021 upon evaluation today patient actually appears to be doing significantly better after having wrapped him last week. Overall I think that this is making significant improvements at this time which is great news. I do not see any evidence of infection which is great news as well. No fevers, chills, nausea, vomiting, or diarrhea. 09/14/2021 upon evaluation today patient appears to be doing well with regard to his leg ulcer. He has been tolerating the dressing changes and overall I think that he is making excellent progress. I do not see any signs of active infection at this time. 09/21/2021 upon evaluation today patient actually appears to be making good progress with regard to his wound this is again measuring smaller today no debridement seems to be necessary. We have been using [name] silver collagen dressing and I think that is doing an awesome job. 09/28/2021 upon evaluation today patient appears to be doing well with regard to his leg currently. I do not see any signs of active infection at this time which is great news. No fevers, chills, nausea, vomiting, or diarrhea. I think this wound is very close to complete resolution. 10/12/2021 upon evaluation today patient  actually appears to be doing awesome in regard to his leg ulcer. In fact this appears to be completely healed based on what I am seeing currently. I do not see any evidence of active infection locally nor systemically at this time which is also great news. No fevers, chills, nausea, vomiting, or diarrhea. Readmission: 12/07/2021 upon evaluation today patient presents for readmission here in the clinic. He was discharged on 10/12/2021 is completely healed. Unfortunately this has reopened at this point and he is having continual issues with new blisters over both lower extremities. This is even worse than what we previously saw. Nonetheless we did actually check his ABIs today and it did reveal that his ABIs were 0.55 on the left and 0.57 on the right. Subsequently this is [name] definite change from his last arterial study which showed that he did have  good blood flow at 1.01 on the right and 0.92 on the left and that was right at the beginning of 2019. Nonetheless based on what we see currently I do think he tolerated the 3 layer compression wrap but I do believe that we probably need to get him tested for his arterial flow in order to see where things stand and if there is something we can do there that would help prevent this from continue to be an ongoing issue. He did not utilize compression socks in the interim from when he was last here till this time. That something is probably going to need lifelong going forward as well. 3/9; patient presents for follow-up. He has no issues or complaints today. He tolerated the compression wrap well. He had ABIs with TBI's done. He denies signs of infection. [name], [name] ([id number]) [id number] Page 7 of 9 12/21/2021 upon evaluation today patient appears to be doing well with regard to the wounds on his legs. Both are showing signs of significant improvement which is great news although I do believe some sharp debridement would be of  benefit here as well. 12/28/2021 upon evaluation today patient appears to be doing well with regard to his wounds. Everything is showing signs of excellent improvement which I am very pleased about. I think that we are headed in the right direction here. Fortunately there does not appear to be any evidence of infection which is great news there is [name] little bit of hypergranulation. 01/04/2022 upon evaluation today patient appears to be doing well with regard to his wounds 2 of them are healed 1 is almost so and the other 1 is significantly better. Overall I am extremely pleased with where we stand and I think that he is making excellent progress here. I do not see any evidence of active infection locally nor systemically at this time. 01-16-2022 upon evaluation today patient's wound on the left leg is showing signs of doing quite well. Has not completely cleared at this point but it is much improved. Fortunately I do not see any signs of infection at this time. No fevers, chills, nausea, vomiting, or diarrhea. 01-23-2022 upon evaluation today patient's wound of the left leg actually appears to be pretty much completely healed which is great news. I do not see any signs of active infection locally or systemically which is excellent. With that being said on the right leg what wound is measuring smaller the other 1 is [name] new wound that just showed up fortunately its not too bad. Has been using Xeroform here and that seems to be doing decently well which is great news. Unfortunately his blood pressure is significantly high we gave him the readings for the past 4-5 visits as well as [name] recommendation to make an appointment to go discuss this with his primary care provider patient states that he is going to look into doing this. 01-30-2022 upon evaluation today patient appears to be doing well with regard to his left leg everything appears to be healed. On the right leg the more anterior wound is healed the more  medial wound that I been concerned about [name] possible skin cancer unfortunately still does not look great to me. I do believe that we should probably do [name] biopsy I have talked about it with him [name] few times I think though it is probably time to go ahead and do this at this point. 02-09-2022 upon evaluation today patient appears to be doing well with regard to  his legs. On the left this appears to be completely healed. On the right he does have 2 areas and be perfectly honest one of them is [name] skin cancer that he is going to the Mohs surgery clinic for the other seems to be healing nicely. Readmission: 08-02-2022 upon evaluation today patient appears for reevaluation here in our clinic concerning issues that he has been having with wounds over the bilateral lower extremities. I last saw him in May 2023 and at that point we had him completely healed. Unfortunately he is tells me this has broken down to some degree since that point. 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 He tells me this 1 seems to just be getting bigger and bigger not improving. Fortunately he is not having any significant pain which is good news he does have quite [name] bit of swelling and he tells me that his fluid pills are not recommended for him to take daily but just in 3-day intervals here and there. 08-09-2022 upon evaluation today patient appears to be doing still somewhat poorly in regard to his legs although in general he does not appear to be feeling as good as he has been. Fortunately there does not appear to be any signs of infection which is good news. With that being said he is having some issues here with having and overall poor feeling in general which again is good I think going to be the biggest complicating factor. He  actually seems to be coughing I do not hear any wheezing right now I did listen to his chest he did not have good airflow down low however makes me suspicious for bronchitis or even possibly pneumonia which could be part of what is going on here as well. Fortunately I do not see any evidence of anything worsening in regard to his legs but I definitely believe that he needs to continue with the compression wraps he took them off yesterday to shower has not had anything on for 24 hours that is why his legs are so swollen today. With regard to his pathology report I did review that it showed some squamous abnormality but no signs of distinct carcinoma. With that being said it was saying that it could be adjacent to [name] squamous cell carcinoma nonetheless my suggestion is can be that we have the patient take copy of this report and give it to his Mohs surgeon in order for them to see if there is anything they feel like needs to be done further. With that being said right now I feel like the primary thing is going to be for [place] to try to get his swelling down and keep that down into that hand since he is having so much drainage I believe we can have to bring him in for dressing changes twice [name] week doing [name] nurse visit on Mondays. 11/9; since the patient was last here he spent the night in the emergency room he received IV Lasix. Also received antibiotics although he was not discharged on either 1 of these. He also saw his cardiology office who put him on regular Lasix 20 mg [previously on as needed Lasix 20 mg]. Per our intake nurse the swelling in his legs is remarkably better but he still has bilateral  lower extremity wounds. He still has wounds on the bilateral lower extremities most problematically on the left lateral calf. He has been using silver alginate under 3 layer compression. Objective Constitutional Patient is hypertensive.. Pulse regular and within target range for patient.Marland Kitchen Respirations  regular, non-labored and within target range.. Temperature is normal and within the target range for the patient.Marland Kitchen appears in no distress. Vitals Time Taken: 10:57 AM, Height: 75 in, Weight: 225 lbs, BMI: 28.1, Temperature: 98.3 F, Pulse: 66 bpm, Respiratory Rate: 16 breaths/min, Blood Pressure: 160/94 mmHg. General Notes: Wound exam; the patient's largest areas on the left lateral calf under illumination it looks fairly stable I did not debride this today. There is smaller areas on the left anterior 2 small areas on the right as well. No debridement was done. The edema control in his legs is [name] lot better he still has very dry scaly skin bilaterally. Edema control is much better wound exam; his legs look [name] lot better although these still have dry fissured flaking skin. Wounds look dry Integumentary (Hair, Skin) Wound #10 status is Open. Original cause of wound was Gradually Appeared. The date acquired was: 01/20/2022. The wound has been in treatment 2 weeks. The wound is located on the Left,Medial Lower Leg. The wound measures 1cm length x 1.3cm width x 0.1cm depth; 1.021cm^2 area and 0.102cm^3 volume. There is Fat Layer (Subcutaneous Tissue) exposed. There is [name] medium amount of serous drainage noted. Wound #11 status is Open. Original cause of wound was Gradually Appeared. The date acquired was: 01/20/2022. The wound has been in treatment 2 weeks. The wound is located on the Left,Posterior Lower Leg. The wound measures 0.7cm length x 0.6cm width x 0.1cm depth; 0.33cm^2 area and 0.033cm^3 volume. There is Fat Layer (Subcutaneous Tissue) exposed. There is [name] medium amount of serous drainage noted. The wound margin is distinct with the outline attached to the wound base. There is small (1-33%) red granulation within the wound bed. There is no necrotic tissue within the wound bed. Wound #12 status is Open. Original cause of wound was Gradually Appeared. The date acquired was: 01/22/2022. The wound has been  in treatment 2 weeks. [name], [name] ([id number]) [id number] Page 8 of 9 The wound is located on the Right,Medial Lower Leg. The wound measures 1.6cm length x 1.4cm width x 0.1cm depth; 1.759cm^2 area and 0.176cm^3 volume. There is [name] large amount of serous drainage noted. There is medium (34-66%) red granulation within the wound bed. There is [name] medium (34-66%) amount of necrotic tissue within the wound bed including Adherent Slough. Wound #13 status is Open. Original cause of wound was Gradually Appeared. The date acquired was: 01/22/2022. The wound has been in treatment 2 weeks. The wound is located on the Right,Midline Lower Leg. The wound measures 0.5cm length x 0.6cm width x 0.2cm depth; 0.236cm^2 area and 0.047cm^3 volume. There is [name] large amount of serous drainage noted. Wound #14 status is Open. Original cause of wound was Gradually Appeared. The date acquired was: 01/20/2022. The wound has been in treatment 2 weeks. The wound is located on the Right,Lateral Lower Leg. The wound measures 1cm length x 1.3cm width x 0.1cm depth; 1.021cm^2 area and 0.102cm^3 volume. There is Fat Layer (Subcutaneous Tissue) exposed. There is [name] medium amount of serous drainage noted. There is medium (34-66%) red granulation within the wound bed. There is [name] medium (34-66%) amount of necrotic tissue within the wound bed including Adherent Slough. Wound #9 status is Open. Original cause of  wound was Gradually Appeared. The date acquired was: 01/20/2022. The wound has been in treatment 2 weeks. The wound is located on the Left,Lateral Lower Leg. The wound measures 4.8cm length x 4cm width x 0.1cm depth; 15.08cm^2 area and 1.508cm^3 volume. There is Fat Layer (Subcutaneous Tissue) exposed. There is [name] large amount of serosanguineous drainage noted. The wound margin is thickened. There is medium (34- 66%) red, hyper - granulation within the wound bed. There is [name] medium (34-66%) amount of necrotic  tissue within the wound bed including Adherent Slough. 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 We reapplied silver alginate to the wounds for now. TCA and moisturizer to his skin on the lower legs diffusely still under 3 layer compression. 2. I did not do any debridement today. 3. Looking through his records it appears that he received 20 mg of IV Lasix in the ER and was on 20 mg of as needed p.o. Lasix at home he probably needs to take this routinely once [name] day and we talked about this Electronic Signature(s) Signed: 08/16/2022 3:56:39 PM By: Linton Ham MD Entered By: Linton Ham on 08/16/2022 11:59:58 -------------------------------------------------------------------------------- SuperBill Details Patient Name: Date of Service: [name], [name] 08/16/2022 Medical Record Number: [id number] Patient Account Number: [id number] Date of Birth/Sex: Treating RN: [birth year] (76 y.o. [name] Primary Care Provider: [name] Other Clinician: Referring Provider: Treating Provider/Extender: Eldridge Dace, Parker EL Annye English in Treatment: 2 Diagnosis Coding ICD-10 Codes Code Description [phone] Chronic venous hypertension (idiopathic) with ulcer and inflammation of bilateral lower extremity L97.822 Non-pressure chronic ulcer of other part of left lower leg with fat layer exposed AKSHAY, SPANG [name] ([id number]) [id number] Page 9 of 9 L97.812 Non-pressure chronic ulcer of other part of right lower leg with fat layer exposed I73.89 Other specified peripheral vascular  diseases I48.0 Paroxysmal atrial fibrillation I10 Essential (primary) hypertension Facility Procedures : CPT4: Code [id number] Description: 81 BILATERAL: Application of multi-layer venous compression system; leg (below knee), including ankle and t. Modifier: Quantity: 1 Physician Procedures : CPT4 Code Description Modifier [id number] - WC PHYS LEVEL 3 - EST PT ICD-10 Diagnosis Description L97.822 Non-pressure chronic ulcer of other part of left lower leg with fat layer exposed L97.812 Non-pressure chronic ulcer of other part of right  lower leg with fat layer exposed 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 Quantity: 1 Electronic Signature(s) Signed: 08/16/2022 4:16:37 PM By: [name] MSN RN CNS WTA Signed: 08/17/2022 12:57:19 PM By: Linton Ham MD Previous Signature: 08/16/2022 3:56:39 PM Version By: Linton Ham MD Entered By: [name] on 08/16/2022 16:16:36

## 2022-08-17 NOTE — Progress Notes (Signed)
[name] 08/16/2022 10:45 [name] M Medical Record Number: [id number] Patient Account Number: [id number] Date of Birth/Sex: Treating RN: 1946-01-09 (76 y.o. [name] Primary Care Dauntae Derusha: [name] Other Clinician: Referring Meridee Branum: Treating Rogene [name]/Extender: [name], MICHA EL Annye English in Treatment: 2 Compression Therapy Performed for Wound Assessment: Wound #14 Right,Lateral Lower Leg Performed By: Clinician [name], RN Compression Type: Three Layer Post Procedure Diagnosis Same as Pre-procedure Electronic Signature(s) Signed: 08/16/2022 4:15:09 PM By: [name] MSN RN CNS WTA Entered By: [name] on 08/16/2022 16:15:09 -------------------------------------------------------------------------------- Encounter Discharge Information Details Patient Name: Date of Service: [name], [name] 08/16/2022 10:45 [name] M Medical Record Number: [id number] Patient Account Number: [id number] Date of Birth/Sex: Treating RN: 07/26/1946 (76 y.o. [name] Primary Care Athziry Millican: [name] Other  Clinician: Referring Deane Melick: Treating Selam Pietsch/Extender: [name], MICHA EL Annye English in Treatment: 2 Encounter Discharge Information Items Discharge Condition: Stable Ambulatory Status: [place] Discharge Destination: Home Transportation: Private Auto Schedule Follow-up Appointment: Yes Clinical Summary of Care: Electronic Signature(s) Signed: 08/16/2022 4:19:06 PM By: [name] MSN RN CNS WTA Entered By: [name] on 08/16/2022 16:19:05 Shela Nevin [name] ([id number]) [id number] Page 6 of 17 -------------------------------------------------------------------------------- Lower Extremity Assessment Details Patient Name: Date of Service: [name] [name] Scot [name] 08/16/2022 10:45 [name] M Medical Record Number: [id number] Patient Account Number: [id number] Date of Birth/Sex: Treating RN: 06/27/1946 (76 y.o. [name] Primary Care Boykin Baetz: [name] Other Clinician: Referring Lolly Glaus: Treating Fields Oros/Extender: Eldridge Dace, MICHA EL Annye English in Treatment: 2 Edema Assessment Assessed: [Left: No] [Right: No] [Left: Edema] [Right: :] Calf Left: Right: Point of Measurement: 37 cm From Medial Instep 37.5 cm 36 cm Ankle Left: Right: Point of Measurement: 13 cm From Medial Instep 24 cm 25 cm Vascular Assessment Pulses: Dorsalis Pedis Palpable: [Left:Yes] [Right:Yes] Electronic Signature(s) Signed: 08/16/2022 1:51:56 PM By: [name] MSN RN CNS WTA Entered By: [name] on 08/16/2022 13:51:56 -------------------------------------------------------------------------------- Multi Wound Chart Details Patient Name: Date of Service: [name], [name] 08/16/2022 10:45 [name] M Medical Record Number: [id number] Patient Account Number: [id number] Date of Birth/Sex: Treating RN: 12/09/1945 (76 y.o. [name] Primary Care Melisa Donofrio: [name] Other Clinician: Referring Homer Miller: Treating Kyian Obst/Extender: Eldridge Dace, MICHA  EL Annye English in Treatment: 2 Vital Signs Height(in): 75 Pulse(bpm): 66 Weight(lbs): 225 Blood Pressure(mmHg): 160/94 Body Mass Index(BMI): 28.1 Temperature(F): 98.3 Respiratory Rate(breaths/min): [address] [place] ([id number]) [id number] Page 7 of 17 [10:Photos:] Left, Medial Lower Leg Left, Posterior Lower Leg Right, Medial Lower Leg Wound Location: Gradually Appeared Gradually Appeared Gradually Appeared 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 Primary Etiology: Arrhythmia, Hypertension, Gout Arrhythmia, Hypertension, Gout Arrhythmia, Hypertension, Gout Comorbid History: 01/20/2022 01/20/2022 01/22/2022 Date Acquired: '2 2 2 '$ Weeks of Treatment: Open Open Open Wound Status: No No No Wound Recurrence: 1x1.3x0.1 0.7x0.6x0.1 1.6x1.4x0.1 Measurements L x W x D (cm) 1.021 0.33 1.759 [name] (cm) : rea 0.102 0.033 0.176 Volume (cm) : 33.40% 14.30% 1.80% % Reduction in [name] rea: 66.70% 13.20% 1.70% % Reduction in Volume: Full Thickness Without Exposed Full Thickness Without Exposed Full Thickness Without Exposed Classification: Support Structures Support Structures Support Structures Medium Medium Large Exudate Amount: Serous Serous Serous Exudate Type: Geophysical data processor Exudate Color: N/[name] Distinct, outline attached N/[name] Wound Margin: N/[name] Small (1-33%) Medium (34-66%) Granulation Amount: N/[name] Red Red Granulation Quality: N/[name] None Present (0%) Medium (34-66%) Necrotic Amount:  TEO, MOEDE [name] ([id number]) [id number] Page 1 of 17 Visit Report for 08/16/2022 Arrival Information Details Patient Name: Date of Service: [name] 08/16/2022 10:45 [name] M Medical Record Number: [id number] Patient Account Number: [id number] Date of Birth/Sex: Treating RN: 09/12/1946 (76 y.o. [name] Primary Care Chantalle Defilippo: [name] Other Clinician: Referring Derica Leiber: Treating Tippi Mccrae/Extender: Eldridge Dace, MICHA EL Annye English in Treatment: 2 Visit Information History Since Last Visit Added or deleted any medications: No Patient Arrived: Ambulatory Any new allergies or adverse reactions: No Arrival Time: 10:55 Had [name] fall or experienced change in No Accompanied By: self activities of daily living that may affect Transfer Assistance: None risk of falls: Patient Requires Transmission-Based No Hospitalized since last visit: No Precautions: Pain Present Now: No Patient Has Alerts: Yes Patient Alerts: Patient on Blood Thinner 12/13/21 TBI [name]; NOT diabetic HISTORY OF SKIN CANCERS Electronic Signature(s) Signed: 08/16/2022 1:50:02 PM By: [name] MSN RN CNS WTA Entered By: [name] on 08/16/2022 13:50:01 -------------------------------------------------------------------------------- Clinic Level of Care Assessment Details Patient Name: Date of Service: [name] [name] Scot [name] 08/16/2022 10:45 [name] M Medical Record Number: [id number] Patient Account Number: [id number] Date of Birth/Sex: Treating RN: 06/07/1946 (76 y.o. [name] Primary Care Ramanda Paules: [name] Other Clinician: Referring Ymani Porcher: Treating Hoorain Kozakiewicz/Extender: [name], MICHA EL Annye English in Treatment: 2 Clinic Level of Care Assessment Items TOOL 1 Quantity Score '[]'$  - 0 Use when EandM and Procedure is performed on INITIAL visit ASSESSMENTS - Nursing Assessment / Reassessment '[]'$  - 0 General Physical Exam (combine  w/ comprehensive assessment (listed just below) when performed on new pt. evals) '[]'$  - 0 Comprehensive Assessment (HX, ROS, Risk Assessments, Wounds Hx, etc.) ASSESSMENTS - Wound and Skin Assessment / Reassessment [name], [name] [id number] Page 2 of 17 '[]'$  - 0 Dermatologic / Skin Assessment (not related to wound area) ASSESSMENTS - Ostomy and/or Continence Assessment and Care '[]'$  - 0 Incontinence Assessment and Management '[]'$  - 0 Ostomy Care Assessment and Management (repouching, etc.) PROCESS - Coordination of Care '[]'$  - 0 Simple Patient / Family Education for ongoing care '[]'$  - 0 Complex (extensive) Patient / Family Education for ongoing care '[]'$  - 0 Staff obtains Programmer, systems, Records, T Results / Process Orders est '[]'$  - 0 Staff telephones HHA, Nursing Homes / Clarify orders / etc '[]'$  - 0 Routine Transfer to another Facility (non-emergent condition) '[]'$  - 0 Routine Hospital Admission (non-emergent condition) '[]'$  - 0 New Admissions / Biomedical engineer / Ordering NPWT Apligraf, etc. , '[]'$  - 0 Emergency Hospital Admission (emergent condition) PROCESS - Special Needs '[]'$  - 0 Pediatric / Minor Patient Management '[]'$  - 0 Isolation Patient Management '[]'$  - 0 Hearing / Language / Visual special needs '[]'$  - 0 Assessment of Community assistance (transportation, [name], etc.) '[]'$  - 0 Additional assistance / Altered mentation '[]'$  - 0 Support Surface(s) Assessment (bed, cushion, seat, etc.) INTERVENTIONS - Miscellaneous '[]'$  - 0 External ear exam '[]'$  - 0 Patient Transfer (multiple staff / Civil Service fast streamer / Similar devices) '[]'$  - 0 Simple Staple / Suture removal (25 or less) '[]'$  - 0 Complex Staple / Suture removal (26 or more) '[]'$  - 0 Hypo/Hyperglycemic Management (do not check if billed separately) '[]'$  - 0 Ankle / Brachial Index (ABI) - do not check if billed separately Has the patient been seen at the hospital within the last three years:  Yes Total Score: 0 Level Of Care: ____ Electronic  Signature(s) Signed: 08/16/2022 4:19:44 PM By: [name] MSN RN CNS WTA Entered By: [name] on 08/16/2022 16:15:52 -------------------------------------------------------------------------------- Compression Therapy Details Patient Name: Date of Service: [name], [name] 08/16/2022 10:45 [name] M Medical Record Number: [id number] Patient Account Number: [id number] Date of Birth/Sex: Treating RN: [birth year] (76 y.o. [name] Primary Care Azalee Weimer: [name] Other Clinician: Referring Shawnetta Lein: Treating Jeany Seville/Extender: [name], MICHA EL Annye English in Treatment: 2 Compression Therapy Performed for Wound Assessment: Wound #9 Left,Lateral Lower Leg [name], [name] ([id number]) [id number] Page 3 of 17 Performed By: Clinician [name], RN Compression Type: Three Layer Post Procedure Diagnosis Same as Pre-procedure Electronic Signature(s) Signed: 08/16/2022 4:15:07 PM By: [name] MSN RN CNS WTA Entered By: [name] on 08/16/2022 16:15:07 -------------------------------------------------------------------------------- Compression Therapy Details Patient Name: Date of Service: [name], [name] 08/16/2022 10:45 [name] M Medical Record Number: [id number] Patient Account Number: [id number] Date of Birth/Sex: Treating RN: 03/15/1946 (76 y.o. [name] Primary Care Janielle Mittelstadt: [name] Other Clinician: Referring Carling Liberman: Treating Ruger Saxer/Extender: [name], MICHA EL Annye English in Treatment: 2 Compression Therapy Performed for Wound Assessment: Wound #10 Left,Medial Lower Leg Performed By: Clinician [name], RN Compression Type: Three Layer Post Procedure Diagnosis Same as Pre-procedure Electronic Signature(s) Signed: 08/16/2022 4:15:08 PM By: [name] MSN RN CNS WTA Entered By: [name] on 08/16/2022  16:15:07 -------------------------------------------------------------------------------- Compression Therapy Details Patient Name: Date of Service: [name], [name] 08/16/2022 10:45 [name] M Medical Record Number: [id number] Patient Account Number: [id number] Date of Birth/Sex: Treating RN: March 18, 1946 (76 y.o. [name] Primary Care Channon Ambrosini: [name] Other Clinician: Referring Machael Raine: Treating Haevyn Ury/Extender: [name], MICHA EL Annye English in Treatment: 2 Compression Therapy Performed for Wound Assessment: Wound #11 Left,Posterior Lower Leg Performed By: Clinician [name], RN Compression Type: Three Layer Post Procedure Diagnosis Same as Pre-procedure [name], [name] ([id number]) [id number] Page 4 of 17 Electronic Signature(s) Signed: 08/16/2022 4:15:08 PM By: [name] MSN RN CNS WTA Entered By: [name] on 08/16/2022 16:15:08 -------------------------------------------------------------------------------- Compression Therapy Details Patient Name: Date of Service: [name], [name] 08/16/2022 10:45 [name] M Medical Record Number: [id number] Patient Account Number: [id number] Date of Birth/Sex: Treating RN: 1945-12-19 (76 y.o. [name] Primary Care Anicia Leuthold: [name] Other Clinician: Referring Keahi Mccarney: Treating Daveah Varone/Extender: [name], MICHA EL Annye English in Treatment: 2 Compression Therapy Performed for Wound Assessment: Wound #12 Right,Medial Lower Leg Performed By: Clinician [name], RN Compression Type: Three Layer Post Procedure Diagnosis Same as Pre-procedure Electronic Signature(s) Signed: 08/16/2022 4:15:08 PM By: [name] MSN RN CNS WTA Entered By: [name] on 08/16/2022 16:15:08 -------------------------------------------------------------------------------- Compression Therapy Details Patient Name: Date of Service: [name], [name] 08/16/2022 10:45 [name]  M Medical Record Number: [id number] Patient Account Number: [id number] Date of Birth/Sex: Treating RN: 07-Jun-1946 (76 y.o. [name] Primary Care Antania Hoefling: [name] Other Clinician: Referring Encarnacion Bole: Treating Merian Wroe/Extender: [name], MICHA EL Annye English in Treatment: 2 Compression Therapy Performed for Wound Assessment: Wound #13 Right,Midline Lower Leg Performed By: Clinician [name], RN Compression Type: Three Layer Post Procedure Diagnosis Same as Pre-procedure Electronic Signature(s) Signed: 08/16/2022 4:15:09 PM By: [name] MSN RN CNS WTA Entered By: [name] on 08/16/2022 [place], [place] ([id number]) [id number] Page 5 of 17 -------------------------------------------------------------------------------- Compression Therapy Details Patient Name: Date of Service: [name] [place],  Signature(s) Signed: 08/16/2022 4:19:44 PM By: [name] MSN RN CNS WTA Entered By: [name] on 08/16/2022 16:15:52 -------------------------------------------------------------------------------- Compression Therapy Details Patient Name: Date of Service: [name], [name] 08/16/2022 10:45 [name] M Medical Record Number: [id number] Patient Account Number: [id number] Date of Birth/Sex: Treating RN: [birth year] (76 y.o. [name] Primary Care Azalee Weimer: [name] Other Clinician: Referring Shawnetta Lein: Treating Jeany Seville/Extender: [name], MICHA EL Annye English in Treatment: 2 Compression Therapy Performed for Wound Assessment: Wound #9 Left,Lateral Lower Leg [name], [name] ([id number]) [id number] Page 3 of 17 Performed By: Clinician [name], RN Compression Type: Three Layer Post Procedure Diagnosis Same as Pre-procedure Electronic Signature(s) Signed: 08/16/2022 4:15:07 PM By: [name] MSN RN CNS WTA Entered By: [name] on 08/16/2022 16:15:07 -------------------------------------------------------------------------------- Compression Therapy Details Patient Name: Date of Service: [name], [name] 08/16/2022 10:45 [name] M Medical Record Number: [id number] Patient Account Number: [id number] Date of Birth/Sex: Treating RN: 03/15/1946 (76 y.o. [name] Primary Care Janielle Mittelstadt: [name] Other Clinician: Referring Carling Liberman: Treating Ruger Saxer/Extender: [name], MICHA EL Annye English in Treatment: 2 Compression Therapy Performed for Wound Assessment: Wound #10 Left,Medial Lower Leg Performed By: Clinician [name], RN Compression Type: Three Layer Post Procedure Diagnosis Same as Pre-procedure Electronic Signature(s) Signed: 08/16/2022 4:15:08 PM By: [name] MSN RN CNS WTA Entered By: [name] on 08/16/2022  16:15:07 -------------------------------------------------------------------------------- Compression Therapy Details Patient Name: Date of Service: [name], [name] 08/16/2022 10:45 [name] M Medical Record Number: [id number] Patient Account Number: [id number] Date of Birth/Sex: Treating RN: March 18, 1946 (76 y.o. [name] Primary Care Channon Ambrosini: [name] Other Clinician: Referring Machael Raine: Treating Haevyn Ury/Extender: [name], MICHA EL Annye English in Treatment: 2 Compression Therapy Performed for Wound Assessment: Wound #11 Left,Posterior Lower Leg Performed By: Clinician [name], RN Compression Type: Three Layer Post Procedure Diagnosis Same as Pre-procedure [name], [name] ([id number]) [id number] Page 4 of 17 Electronic Signature(s) Signed: 08/16/2022 4:15:08 PM By: [name] MSN RN CNS WTA Entered By: [name] on 08/16/2022 16:15:08 -------------------------------------------------------------------------------- Compression Therapy Details Patient Name: Date of Service: [name], [name] 08/16/2022 10:45 [name] M Medical Record Number: [id number] Patient Account Number: [id number] Date of Birth/Sex: Treating RN: 1945-12-19 (76 y.o. [name] Primary Care Anicia Leuthold: [name] Other Clinician: Referring Keahi Mccarney: Treating Daveah Varone/Extender: [name], MICHA EL Annye English in Treatment: 2 Compression Therapy Performed for Wound Assessment: Wound #12 Right,Medial Lower Leg Performed By: Clinician [name], RN Compression Type: Three Layer Post Procedure Diagnosis Same as Pre-procedure Electronic Signature(s) Signed: 08/16/2022 4:15:08 PM By: [name] MSN RN CNS WTA Entered By: [name] on 08/16/2022 16:15:08 -------------------------------------------------------------------------------- Compression Therapy Details Patient Name: Date of Service: [name], [name] 08/16/2022 10:45 [name]  M Medical Record Number: [id number] Patient Account Number: [id number] Date of Birth/Sex: Treating RN: 07-Jun-1946 (76 y.o. [name] Primary Care Antania Hoefling: [name] Other Clinician: Referring Encarnacion Bole: Treating Merian Wroe/Extender: [name], MICHA EL Annye English in Treatment: 2 Compression Therapy Performed for Wound Assessment: Wound #13 Right,Midline Lower Leg Performed By: Clinician [name], RN Compression Type: Three Layer Post Procedure Diagnosis Same as Pre-procedure Electronic Signature(s) Signed: 08/16/2022 4:15:09 PM By: [name] MSN RN CNS WTA Entered By: [name] on 08/16/2022 [place], [place] ([id number]) [id number] Page 5 of 17 -------------------------------------------------------------------------------- Compression Therapy Details Patient Name: Date of Service: [name] [place],  [name] 08/16/2022 10:45 [name] M Medical Record Number: [id number] Patient Account Number: [id number] Date of Birth/Sex: Treating RN: 1946-01-09 (76 y.o. [name] Primary Care Dauntae Derusha: [name] Other Clinician: Referring Meridee Branum: Treating Rogene [name]/Extender: [name], MICHA EL Annye English in Treatment: 2 Compression Therapy Performed for Wound Assessment: Wound #14 Right,Lateral Lower Leg Performed By: Clinician [name], RN Compression Type: Three Layer Post Procedure Diagnosis Same as Pre-procedure Electronic Signature(s) Signed: 08/16/2022 4:15:09 PM By: [name] MSN RN CNS WTA Entered By: [name] on 08/16/2022 16:15:09 -------------------------------------------------------------------------------- Encounter Discharge Information Details Patient Name: Date of Service: [name], [name] 08/16/2022 10:45 [name] M Medical Record Number: [id number] Patient Account Number: [id number] Date of Birth/Sex: Treating RN: 07/26/1946 (76 y.o. [name] Primary Care Athziry Millican: [name] Other  Clinician: Referring Deane Melick: Treating Selam Pietsch/Extender: [name], MICHA EL Annye English in Treatment: 2 Encounter Discharge Information Items Discharge Condition: Stable Ambulatory Status: [place] Discharge Destination: Home Transportation: Private Auto Schedule Follow-up Appointment: Yes Clinical Summary of Care: Electronic Signature(s) Signed: 08/16/2022 4:19:06 PM By: [name] MSN RN CNS WTA Entered By: [name] on 08/16/2022 16:19:05 Shela Nevin [name] ([id number]) [id number] Page 6 of 17 -------------------------------------------------------------------------------- Lower Extremity Assessment Details Patient Name: Date of Service: [name] [name] Scot [name] 08/16/2022 10:45 [name] M Medical Record Number: [id number] Patient Account Number: [id number] Date of Birth/Sex: Treating RN: 06/27/1946 (76 y.o. [name] Primary Care Boykin Baetz: [name] Other Clinician: Referring Lolly Glaus: Treating Fields Oros/Extender: Eldridge Dace, MICHA EL Annye English in Treatment: 2 Edema Assessment Assessed: [Left: No] [Right: No] [Left: Edema] [Right: :] Calf Left: Right: Point of Measurement: 37 cm From Medial Instep 37.5 cm 36 cm Ankle Left: Right: Point of Measurement: 13 cm From Medial Instep 24 cm 25 cm Vascular Assessment Pulses: Dorsalis Pedis Palpable: [Left:Yes] [Right:Yes] Electronic Signature(s) Signed: 08/16/2022 1:51:56 PM By: [name] MSN RN CNS WTA Entered By: [name] on 08/16/2022 13:51:56 -------------------------------------------------------------------------------- Multi Wound Chart Details Patient Name: Date of Service: [name], [name] 08/16/2022 10:45 [name] M Medical Record Number: [id number] Patient Account Number: [id number] Date of Birth/Sex: Treating RN: 12/09/1945 (76 y.o. [name] Primary Care Melisa Donofrio: [name] Other Clinician: Referring Homer Miller: Treating Kyian Obst/Extender: Eldridge Dace, MICHA  EL Annye English in Treatment: 2 Vital Signs Height(in): 75 Pulse(bpm): 66 Weight(lbs): 225 Blood Pressure(mmHg): 160/94 Body Mass Index(BMI): 28.1 Temperature(F): 98.3 Respiratory Rate(breaths/min): [address] [place] ([id number]) [id number] Page 7 of 17 [10:Photos:] Left, Medial Lower Leg Left, Posterior Lower Leg Right, Medial Lower Leg Wound Location: Gradually Appeared Gradually Appeared Gradually Appeared 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 Primary Etiology: Arrhythmia, Hypertension, Gout Arrhythmia, Hypertension, Gout Arrhythmia, Hypertension, Gout Comorbid History: 01/20/2022 01/20/2022 01/22/2022 Date Acquired: '2 2 2 '$ Weeks of Treatment: Open Open Open Wound Status: No No No Wound Recurrence: 1x1.3x0.1 0.7x0.6x0.1 1.6x1.4x0.1 Measurements L x W x D (cm) 1.021 0.33 1.759 [name] (cm) : rea 0.102 0.033 0.176 Volume (cm) : 33.40% 14.30% 1.80% % Reduction in [name] rea: 66.70% 13.20% 1.70% % Reduction in Volume: Full Thickness Without Exposed Full Thickness Without Exposed Full Thickness Without Exposed Classification: Support Structures Support Structures Support Structures Medium Medium Large Exudate Amount: Serous Serous Serous Exudate Type: Geophysical data processor Exudate Color: N/[name] Distinct, outline attached N/[name] Wound Margin: N/[name] Small (1-33%) Medium (34-66%) Granulation Amount: N/[name] Red Red Granulation Quality: N/[name] None Present (0%) Medium (34-66%) Necrotic Amount:  TEO, MOEDE [name] ([id number]) [id number] Page 1 of 17 Visit Report for 08/16/2022 Arrival Information Details Patient Name: Date of Service: [name] 08/16/2022 10:45 [name] M Medical Record Number: [id number] Patient Account Number: [id number] Date of Birth/Sex: Treating RN: 09/12/1946 (76 y.o. [name] Primary Care Chantalle Defilippo: [name] Other Clinician: Referring Derica Leiber: Treating Tippi Mccrae/Extender: Eldridge Dace, MICHA EL Annye English in Treatment: 2 Visit Information History Since Last Visit Added or deleted any medications: No Patient Arrived: Ambulatory Any new allergies or adverse reactions: No Arrival Time: 10:55 Had [name] fall or experienced change in No Accompanied By: self activities of daily living that may affect Transfer Assistance: None risk of falls: Patient Requires Transmission-Based No Hospitalized since last visit: No Precautions: Pain Present Now: No Patient Has Alerts: Yes Patient Alerts: Patient on Blood Thinner 12/13/21 TBI [name]; NOT diabetic HISTORY OF SKIN CANCERS Electronic Signature(s) Signed: 08/16/2022 1:50:02 PM By: [name] MSN RN CNS WTA Entered By: [name] on 08/16/2022 13:50:01 -------------------------------------------------------------------------------- Clinic Level of Care Assessment Details Patient Name: Date of Service: [name] [name] Scot [name] 08/16/2022 10:45 [name] M Medical Record Number: [id number] Patient Account Number: [id number] Date of Birth/Sex: Treating RN: 06/07/1946 (76 y.o. [name] Primary Care Ramanda Paules: [name] Other Clinician: Referring Ymani Porcher: Treating Hoorain Kozakiewicz/Extender: [name], MICHA EL Annye English in Treatment: 2 Clinic Level of Care Assessment Items TOOL 1 Quantity Score '[]'$  - 0 Use when EandM and Procedure is performed on INITIAL visit ASSESSMENTS - Nursing Assessment / Reassessment '[]'$  - 0 General Physical Exam (combine  w/ comprehensive assessment (listed just below) when performed on new pt. evals) '[]'$  - 0 Comprehensive Assessment (HX, ROS, Risk Assessments, Wounds Hx, etc.) ASSESSMENTS - Wound and Skin Assessment / Reassessment [name], [name] [id number] Page 2 of 17 '[]'$  - 0 Dermatologic / Skin Assessment (not related to wound area) ASSESSMENTS - Ostomy and/or Continence Assessment and Care '[]'$  - 0 Incontinence Assessment and Management '[]'$  - 0 Ostomy Care Assessment and Management (repouching, etc.) PROCESS - Coordination of Care '[]'$  - 0 Simple Patient / Family Education for ongoing care '[]'$  - 0 Complex (extensive) Patient / Family Education for ongoing care '[]'$  - 0 Staff obtains Programmer, systems, Records, T Results / Process Orders est '[]'$  - 0 Staff telephones HHA, Nursing Homes / Clarify orders / etc '[]'$  - 0 Routine Transfer to another Facility (non-emergent condition) '[]'$  - 0 Routine Hospital Admission (non-emergent condition) '[]'$  - 0 New Admissions / Biomedical engineer / Ordering NPWT Apligraf, etc. , '[]'$  - 0 Emergency Hospital Admission (emergent condition) PROCESS - Special Needs '[]'$  - 0 Pediatric / Minor Patient Management '[]'$  - 0 Isolation Patient Management '[]'$  - 0 Hearing / Language / Visual special needs '[]'$  - 0 Assessment of Community assistance (transportation, [name], etc.) '[]'$  - 0 Additional assistance / Altered mentation '[]'$  - 0 Support Surface(s) Assessment (bed, cushion, seat, etc.) INTERVENTIONS - Miscellaneous '[]'$  - 0 External ear exam '[]'$  - 0 Patient Transfer (multiple staff / Civil Service fast streamer / Similar devices) '[]'$  - 0 Simple Staple / Suture removal (25 or less) '[]'$  - 0 Complex Staple / Suture removal (26 or more) '[]'$  - 0 Hypo/Hyperglycemic Management (do not check if billed separately) '[]'$  - 0 Ankle / Brachial Index (ABI) - do not check if billed separately Has the patient been seen at the hospital within the last three years:  Yes Total Score: 0 Level Of Care: ____ Electronic  Signature(s) Signed: 08/16/2022 4:19:44 PM By: [name] MSN RN CNS WTA Entered By: [name] on 08/16/2022 16:15:52 -------------------------------------------------------------------------------- Compression Therapy Details Patient Name: Date of Service: [name], [name] 08/16/2022 10:45 [name] M Medical Record Number: [id number] Patient Account Number: [id number] Date of Birth/Sex: Treating RN: [birth year] (76 y.o. [name] Primary Care Azalee Weimer: [name] Other Clinician: Referring Shawnetta Lein: Treating Jeany Seville/Extender: [name], MICHA EL Annye English in Treatment: 2 Compression Therapy Performed for Wound Assessment: Wound #9 Left,Lateral Lower Leg [name], [name] ([id number]) [id number] Page 3 of 17 Performed By: Clinician [name], RN Compression Type: Three Layer Post Procedure Diagnosis Same as Pre-procedure Electronic Signature(s) Signed: 08/16/2022 4:15:07 PM By: [name] MSN RN CNS WTA Entered By: [name] on 08/16/2022 16:15:07 -------------------------------------------------------------------------------- Compression Therapy Details Patient Name: Date of Service: [name], [name] 08/16/2022 10:45 [name] M Medical Record Number: [id number] Patient Account Number: [id number] Date of Birth/Sex: Treating RN: 03/15/1946 (76 y.o. [name] Primary Care Janielle Mittelstadt: [name] Other Clinician: Referring Carling Liberman: Treating Ruger Saxer/Extender: [name], MICHA EL Annye English in Treatment: 2 Compression Therapy Performed for Wound Assessment: Wound #10 Left,Medial Lower Leg Performed By: Clinician [name], RN Compression Type: Three Layer Post Procedure Diagnosis Same as Pre-procedure Electronic Signature(s) Signed: 08/16/2022 4:15:08 PM By: [name] MSN RN CNS WTA Entered By: [name] on 08/16/2022  16:15:07 -------------------------------------------------------------------------------- Compression Therapy Details Patient Name: Date of Service: [name], [name] 08/16/2022 10:45 [name] M Medical Record Number: [id number] Patient Account Number: [id number] Date of Birth/Sex: Treating RN: March 18, 1946 (76 y.o. [name] Primary Care Channon Ambrosini: [name] Other Clinician: Referring Machael Raine: Treating Haevyn Ury/Extender: [name], MICHA EL Annye English in Treatment: 2 Compression Therapy Performed for Wound Assessment: Wound #11 Left,Posterior Lower Leg Performed By: Clinician [name], RN Compression Type: Three Layer Post Procedure Diagnosis Same as Pre-procedure [name], [name] ([id number]) [id number] Page 4 of 17 Electronic Signature(s) Signed: 08/16/2022 4:15:08 PM By: [name] MSN RN CNS WTA Entered By: [name] on 08/16/2022 16:15:08 -------------------------------------------------------------------------------- Compression Therapy Details Patient Name: Date of Service: [name], [name] 08/16/2022 10:45 [name] M Medical Record Number: [id number] Patient Account Number: [id number] Date of Birth/Sex: Treating RN: 1945-12-19 (76 y.o. [name] Primary Care Anicia Leuthold: [name] Other Clinician: Referring Keahi Mccarney: Treating Daveah Varone/Extender: [name], MICHA EL Annye English in Treatment: 2 Compression Therapy Performed for Wound Assessment: Wound #12 Right,Medial Lower Leg Performed By: Clinician [name], RN Compression Type: Three Layer Post Procedure Diagnosis Same as Pre-procedure Electronic Signature(s) Signed: 08/16/2022 4:15:08 PM By: [name] MSN RN CNS WTA Entered By: [name] on 08/16/2022 16:15:08 -------------------------------------------------------------------------------- Compression Therapy Details Patient Name: Date of Service: [name], [name] 08/16/2022 10:45 [name]  M Medical Record Number: [id number] Patient Account Number: [id number] Date of Birth/Sex: Treating RN: 07-Jun-1946 (76 y.o. [name] Primary Care Antania Hoefling: [name] Other Clinician: Referring Encarnacion Bole: Treating Merian Wroe/Extender: [name], MICHA EL Annye English in Treatment: 2 Compression Therapy Performed for Wound Assessment: Wound #13 Right,Midline Lower Leg Performed By: Clinician [name], RN Compression Type: Three Layer Post Procedure Diagnosis Same as Pre-procedure Electronic Signature(s) Signed: 08/16/2022 4:15:09 PM By: [name] MSN RN CNS WTA Entered By: [name] on 08/16/2022 [place], [place] ([id number]) [id number] Page 5 of 17 -------------------------------------------------------------------------------- Compression Therapy Details Patient Name: Date of Service: [name] [place],

## 2022-08-18 DIAGNOSIS — S63641A Sprain of metacarpophalangeal joint of right thumb, initial encounter: Secondary | ICD-10-CM | POA: Diagnosis not present

## 2022-08-18 DIAGNOSIS — R2231 Localized swelling, mass and lump, right upper limb: Secondary | ICD-10-CM | POA: Diagnosis not present

## 2022-08-18 DIAGNOSIS — M79644 Pain in right finger(s): Secondary | ICD-10-CM | POA: Diagnosis not present

## 2022-08-23 ENCOUNTER — Telehealth: Payer: Self-pay | Admitting: Physician Assistant

## 2022-08-23 ENCOUNTER — Other Ambulatory Visit: Payer: Self-pay | Admitting: *Deleted

## 2022-08-23 ENCOUNTER — Ambulatory Visit: Payer: Medicare PPO | Admitting: Physician Assistant

## 2022-08-23 ENCOUNTER — Encounter: Payer: Medicare PPO | Admitting: Physician Assistant

## 2022-08-23 DIAGNOSIS — I7389 Other specified peripheral vascular diseases: Secondary | ICD-10-CM | POA: Diagnosis not present

## 2022-08-23 DIAGNOSIS — Z85828 Personal history of other malignant neoplasm of skin: Secondary | ICD-10-CM | POA: Diagnosis not present

## 2022-08-23 DIAGNOSIS — I87333 Chronic venous hypertension (idiopathic) with ulcer and inflammation of bilateral lower extremity: Secondary | ICD-10-CM | POA: Diagnosis not present

## 2022-08-23 DIAGNOSIS — I480 Paroxysmal atrial fibrillation: Secondary | ICD-10-CM | POA: Diagnosis not present

## 2022-08-23 DIAGNOSIS — L97812 Non-pressure chronic ulcer of other part of right lower leg with fat layer exposed: Secondary | ICD-10-CM | POA: Diagnosis not present

## 2022-08-23 DIAGNOSIS — C7652 Malignant neoplasm of left lower limb: Secondary | ICD-10-CM | POA: Diagnosis not present

## 2022-08-23 DIAGNOSIS — I5033 Acute on chronic diastolic (congestive) heart failure: Secondary | ICD-10-CM

## 2022-08-23 DIAGNOSIS — R2231 Localized swelling, mass and lump, right upper limb: Secondary | ICD-10-CM | POA: Diagnosis not present

## 2022-08-23 DIAGNOSIS — I10 Essential (primary) hypertension: Secondary | ICD-10-CM | POA: Diagnosis not present

## 2022-08-23 DIAGNOSIS — L97822 Non-pressure chronic ulcer of other part of left lower leg with fat layer exposed: Secondary | ICD-10-CM | POA: Diagnosis not present

## 2022-08-23 DIAGNOSIS — R7989 Other specified abnormal findings of blood chemistry: Secondary | ICD-10-CM

## 2022-08-23 MED ORDER — POTASSIUM CHLORIDE CRYS ER 20 MEQ PO TBCR: 40.0000 meq | EXTENDED_RELEASE_TABLET | Freq: Every day | ORAL | 3 refills | Status: DC

## 2022-08-23 NOTE — Progress Notes (Addendum)
[name] Primary Care Judah Carchi: [name] Other Clinician: Referring [name]: Treating Terah Robey/Extender: [name] in Treatment: 3 Compression Therapy Performed for Wound Assessment: Wound #9 Left,Lateral Lower Leg Performed By: Clinician [name] Loud, RN Compression Type: [place] ([id number]) [id number] Page 5 of 17 Post Procedure Diagnosis Same as Pre-procedure Electronic Signature(s) Signed: 08/23/2022 12:19:05 PM By: [name] Loud MSN RN CNS WTA Entered By: [name] on 08/23/2022 12:19:05 -------------------------------------------------------------------------------- Encounter Discharge Information Details Patient Name: Date of Service: [name], [name] 08/23/2022 10:45 [name] M Medical Record Number: [id number] Patient Account Number: [id number] Date of Birth/Sex: Treating RN: 1946-10-06 (76 y.o. [name]: [name] Other Clinician: Referring [name]: Treating [name]/Extender: [name] in Treatment: 3 Encounter Discharge Information Items Discharge Condition: Stable Ambulatory Status: Ambulatory Discharge Destination: Home Transportation: Private Auto Accompanied By: sister Schedule Follow-up Appointment: Yes Clinical Summary of Care: Electronic  Signature(s) Signed: 08/23/2022 12:24:42 PM By: [name] Loud MSN RN CNS WTA Entered By: [name] on 08/23/2022 12:24:42 -------------------------------------------------------------------------------- Lower Extremity Assessment Details Patient Name: Date of Service: [name] 08/23/2022 10:45 [name] M Medical Record Number: [id number] Patient Account Number: [id number] Date of Birth/Sex: Treating RN: 1946/05/08 (76 y.o. [name] Primary Care Shunna Mikaelian: [name] Other Clinician: Referring Leyana Whidden: Treating [name]/Extender: [name] in Treatment: 3 Edema Assessment Assessed: [name]: Yes] [name]: Yes] Edema: [Left: No] [Right: No] Calf Left: Right: [name], [name] ([id number]) [id number] Page 6 of 17 Point of Measurement: From Medial Instep 36 cm 35 cm Ankle Left: Right: Point of Measurement: From Medial Instep 23 cm 25 cm Vascular Assessment Pulses: Dorsalis Pedis Palpable: [Left:Yes] [Right:Yes] Electronic Signature(s) Signed: 08/23/2022 4:56:08 PM By: [name] Loud MSN RN CNS WTA Entered By: [name] on 08/23/2022 11:29:55 -------------------------------------------------------------------------------- Multi Wound Chart Details Patient Name: Date of Service: [name] 08/23/2022 10:45 [name] M Medical Record Number: [id number] Patient Account Number: [id number] Date of Birth/Sex: Treating RN: 05-Oct-1946 (76 y.o. 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 Primary Etiology: Arrhythmia, Hypertension, Gout Arrhythmia, Hypertension, Gout Arrhythmia, Hypertension, Gout Comorbid History: 01/20/2022 01/20/2022 01/22/2022 Date Acquired: '3 3 3 '$ Weeks of Treatment: Open Healed - Epithelialized Healed - Epithelialized Wound Status: No No No Wound Recurrence: 1x1x0.1 0x0x0 0x0x0 Measurements L x W x D (cm) 0.785 0 0 [name] (cm) : rea 0.079 0 0 Volume (cm) : 48.80% 100.00% 100.00% % Reduction in [name] rea: 74.20% 100.00% 100.00% % Reduction in Volume: Full Thickness Without Exposed Full Thickness Without Exposed Full Thickness Without Exposed Classification: Support Structures Support Structures Support Structures Medium Medium Large Exudate Amount: Serous Serous Serous Exudate Type: [name], [name] ([id number]) [id number] Page 7 of 17 N/[name] Distinct, outline attached N/[name] Wound Margin: N/[name] Small (1-33%) Medium (34-66%) Granulation Amount: N/[name] Red Red Granulation Quality: N/[name] None Present (0%) Medium (34-66%) Necrotic Amount: Fat Layer (Subcutaneous Tissue): Yes Fat Layer (Subcutaneous Tissue): Yes Fascia: No Exposed Structures: Fascia: No Fascia: No Fat Layer (Subcutaneous Tissue): No Tendon: No Tendon: No  Tendon: No Muscle: No Muscle: No Muscle: No Joint: No Joint: No Joint: No Bone: No Bone: No Bone: No N/[name] Small (1-33%) None Epithelialization: Wound Number: '13 14 9 '$ Photos: Right, Midline Lower Leg Right, Lateral Lower Leg Left, Lateral Lower Leg Wound Location: Gradually Appeared Gradually Appeared Gradually Appeared 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 Malignant Wound Primary Etiology: Arrhythmia, Hypertension, Gout Arrhythmia, Hypertension, Gout Arrhythmia, Hypertension, Gout Comorbid History: 01/22/2022 01/20/2022 01/20/2022 Date Acquired: '3 3 3 '$ Weeks of  Treatment: Open Open Open Wound Status: No No No Wound Recurrence: 0.6x0.5x0.1 0.4x0.4x0.21 5x3.4x0.1 Measurements L x W x D (cm) 0.236 0.126 13.352 [name] (cm) : rea 0.024 0.026 1.335 Volume (cm) : 62.40% 75.00% 24.00% % Reduction in [name] rea: 61.90% 74.30% 62.00% % Reduction in Volume: Full Thickness Without Exposed Full Thickness Without Exposed Full Thickness Without Exposed Classification: Support Structures Support Structures Support Structures Large Medium Large Exudate Amount: Serous Serous Serosanguineous Exudate Type: amber amber red, brown Exudate Color: N/[name] N/[name] Thickened Wound Margin: N/[name] Medium (34-66%) Medium (34-66%) Granulation Amount: N/[name] Red Red, Hyper-granulation Granulation Quality: N/[name] Medium (34-66%) Medium (34-66%) Necrotic Amount: N/[name] Fat Layer (Subcutaneous Tissue): Yes Fat Layer (Subcutaneous Tissue): Yes Exposed Structures: Fascia: No Fascia: No Tendon: No Tendon: No Muscle: No Muscle: No Joint: No Joint: No Bone: No Bone: No N/[name] Small (1-33%) Medium (34-66%) Epithelialization: Treatment Notes Electronic Signature(s) Signed: 08/23/2022 4:56:08 PM By: [name] Loud MSN RN CNS WTA Entered By: [name] on 08/23/2022 11:30:17 -------------------------------------------------------------------------------- Multi-Disciplinary Care Plan Details Patient Name: Date of Service: [name] 08/23/2022 10:45 [name] M Medical Record Number: [id number] Patient Account Number: [id number] Date of Birth/Sex: Treating RN: 1946/09/10 (76 y.o. [name] Primary Care Justo Hengel: [name] Other Clinician: Referring Gurinder Toral: Treating Derian Pfost/Extender: Laurier, Jasperson, [place] ([id number]) [phone].pdf Page 8 of 17 Weeks in Treatment: 3 Active Inactive Electronic Signature(s) Signed: 08/23/2022 4:56:08 PM By: [name] Loud MSN RN CNS WTA Entered By: [name] on 08/23/2022  11:30:07 -------------------------------------------------------------------------------- Pain Assessment Details Patient Name: Date of Service: [name], [name] 08/23/2022 10:45 [name] M Medical Record Number: [id number] Patient Account Number: [id number] Date of Birth/Sex: Treating RN: June 29, 1946 (76 y.o. [name] Primary Care Burgess Sheriff: [name] Other Clinician: Referring Brysun Eschmann: Treating Aeson Sawyers/Extender: [name] in Treatment: 3 Active Problems Location of Pain Severity and Description of Pain Patient Has Paino No Site Locations Pain Management and Medication Current Pain Management: Electronic Signature(s) Signed: 08/23/2022 4:56:08 PM By: [name] Loud MSN RN CNS WTA Entered By: [name] on 08/23/2022 10:54:45 [name], [name] ([id number]) [id number].pdf Page 9 of 17 -------------------------------------------------------------------------------- Patient/Caregiver Education Details Patient Name: Date of Service: [name] 11/16/2023andnbsp10:45 [name] M Medical Record Number: [id number] Patient Account Number: [id number] Date of Birth/Gender: Treating RN: [birth year] (76 y.o. [name] Primary Care Physician: [name] Other Clinician: Referring Physician: Treating Physician/Extender: [name] in Treatment: 3 Education Assessment Education Provided To: Patient and Caregiver Education Topics Provided Wound/Skin Impairment: Handouts: Caring for Your Ulcer Methods: Explain/Verbal Responses: State content correctly Electronic Signature(s) Signed: 08/23/2022 4:56:08 PM By: [name] Loud MSN RN CNS WTA Entered By: [name] on 08/23/2022 12:22:40 -------------------------------------------------------------------------------- Wound Assessment Details Patient Name: Date of Service: [name], [name] 08/23/2022 10:45 [name] M Medical Record Number: [id number] Patient  Account Number: [id number] Date of Birth/Sex: Treating RN: [birth year] (76 y.o. [name] Primary Care Margaretta Chittum: [name] Other Clinician:  Treatment: 3 Clustered Wound: No Photos Wound Measurements Length: (cm) Width: (cm) Depth: (cm) Area: (cm) Volume: (cm) 0 % Reduction in Area: 100% 0 % Reduction in Volume: 100% 0 Epithelialization: None 0 0 Wound Description Classification: Full Thickness Without Exposed Support Structures Exudate Amount: Large Exudate Type: Serous Exudate Color: amber IVAR, [name] ([id number]) Wound Bed Granulation Amount: Medium (34-66%) Granulation Quality: Red Necrotic Amount: Medium (34-66%) Foul Odor After Cleansing: No Slough/Fibrino Yes [id number] Page 13 of 17 Exposed Structure Fascia Exposed: No Fat Layer (Subcutaneous Tissue) Exposed: No Tendon Exposed: No Muscle Exposed: No Joint Exposed: No Bone Exposed: No Treatment Notes Wound #12 (Lower Leg) Wound Laterality: Right, Medial Cleanser Peri-Wound Care Topical Primary Dressing Secondary Dressing Secured With Compression Wrap Compression Stockings Add-Ons Electronic Signature(s) Signed: 08/23/2022 4:56:08 PM By: [name] Loud MSN RN CNS  WTA Entered By: [name] on 08/23/2022 11:29:49 -------------------------------------------------------------------------------- Wound Assessment Details Patient Name: Date of Service: [name], [name] 08/23/2022 10:45 [name] M Medical Record Number: [id number] Patient Account Number: [id number] Date of Birth/Sex: Treating RN: [birth year] (76 y.o. [name] Primary Care Alonnah Lampkins: [name] Other Clinician: Referring Ronin Rehfeldt: Treating Christien Frankl/Extender: [name] in Treatment: 3 Wound Status Wound Number: 13 Primary Etiology: Venous Leg Ulcer Wound Location: Right, Midline Lower Leg Wound Status: Open Wounding Event: Gradually Appeared Comorbid History: Arrhythmia, Hypertension, Gout Date Acquired: 01/22/2022 Weeks Of Treatment: 3 Clustered Wound: No Photos [name], [name] ([id number]) [id number] Page 14 of 17 Wound Measurements Length: (cm) 0.6 Width: (cm) 0.5 Depth: (cm) 0.1 Area: (cm) 0.236 Volume: (cm) 0.024 % Reduction in Area: 62.4% % Reduction in Volume: 61.9% Wound Description Classification: Full Thickness Without Exposed Support Exudate Amount: Large Exudate Type: Serous Exudate Color: amber Structures Treatment Notes Wound #13 (Lower Leg) Wound Laterality: Right, Midline Cleanser Peri-Wound Care Topical Primary Dressing Secondary Dressing Secured With Compression Wrap Compression Stockings Add-Ons Electronic Signature(s) Signed: 08/23/2022 4:56:08 PM By: [name] Loud MSN RN CNS WTA Entered By: [name] on 08/23/2022 11:15:53 -------------------------------------------------------------------------------- Wound Assessment Details Patient Name: Date of Service: [name], [name] 08/23/2022 10:45 [name] M Medical Record Number: [id number] Patient Account Number: [id number] Date of Birth/Sex: Treating RN: 1945/11/23 (76 y.o. [name] Primary Care Weronika Birch: [name] Other  Clinician: Referring Levante Simones: Treating Jacobi Nile/Extender: [name] in Treatment: 3 Wound Status Wound Number: 14 Primary Etiology: Venous Leg Ulcer [name], [name] ([id number]) [id number] Page 15 of 17 Wound Location: Right, Lateral Lower Leg Wound Status: Open Wounding Event: Gradually Appeared Comorbid History: Arrhythmia, Hypertension, Gout Date Acquired: 01/20/2022 Weeks Of Treatment: 3 Clustered Wound: No Photos Wound Measurements Length: (cm) 0.4 Width: (cm) 0.4 Depth: (cm) 0.21 Area: (cm) 0.126 Volume: (cm) 0.026 % Reduction in Area: 75% % Reduction in Volume: 74.3% Epithelialization: Small (1-33%) Wound Description Classification: Full Thickness Without Exposed Suppor Exudate Amount: Medium Exudate Type: Serous Exudate Color: amber t Structures Foul Odor After Cleansing: No Slough/Fibrino No Wound Bed Granulation Amount: Medium (34-66%) Exposed Structure Granulation Quality: Red Fascia Exposed: No Necrotic Amount: Medium (34-66%) Fat Layer (Subcutaneous Tissue) Exposed: Yes Necrotic Quality: Adherent Slough Tendon Exposed: No Muscle Exposed: No Joint Exposed: No Bone Exposed: No Treatment Notes Wound #14 (Lower Leg) Wound Laterality: Right, Lateral Cleanser Peri-Wound Care Topical Primary Dressing Secondary Dressing Secured With Compression Wrap Compression Stockings Add-Ons Electronic Signature(s) Signed: 08/23/2022 4:56:08 PM By: [name] Loud MSN RN CNS WTA Entered By: [name] on 08/23/2022 11:15:21 [name] ([id number]) [id number] Page 16  of 17 -------------------------------------------------------------------------------- Wound Assessment Details Patient Name: Date of Service: [name] 08/23/2022 10:45 [name] M Medical Record Number: [id number] Patient Account Number: [id number] Date of Birth/Sex: Treating RN: 03/04/1946 (76 y.o. [name] Primary Care Louna Rothgeb: [name] Other Clinician: Referring Rilea Arutyunyan: Treating Josilyn Shippee/Extender: [name] in Treatment: 3 Wound Status Wound Number: 9 Primary Etiology: Malignant Wound Wound Location: Left, Lateral Lower Leg Wound Status: Open Wounding Event: Gradually Appeared Comorbid History: Arrhythmia, Hypertension, Gout Date Acquired: 01/20/2022 Weeks Of Treatment: 3 Clustered Wound: No Photos Wound Measurements Length: (cm) 5 Width: (cm) 3.4 Depth: (cm) 0.1 Area: (cm) 13.352 Volume: (cm) 1.335 % Reduction in Area: 24% % Reduction in Volume: 62% Epithelialization: Medium (34-66%) Tunneling: No Undermining: No Wound Description Classification: Full Thickness Without Exposed Suppor Wound Margin: Thickened Exudate Amount: Large Exudate Type: Serosanguineous Exudate Color: red, brown t Structures Foul Odor After Cleansing: No Slough/Fibrino Yes Wound Bed Granulation Amount: Medium (34-66%) Exposed Structure Granulation Quality: Red, Hyper-granulation Fascia Exposed: No Necrotic Amount: Medium (34-66%) Fat Layer (Subcutaneous Tissue) Exposed: Yes Necrotic Quality: Adherent Slough Tendon Exposed: No Muscle Exposed: No Joint Exposed: No Bone Exposed: No Treatment Notes Wound #9 (Lower Leg) Wound Laterality: Left, Lateral Cleanser Peri-Wound Care [name], [name] ([id number]) [id number] Page 17 of 17 Topical Primary Dressing Hydrofera Blue Ready Transfer Foam, 4x5 (in/in) Discharge Instruction: Apply Hydrofera Blue Ready to wound bed as directed Secondary Dressing Zetuvit Plus 4x8 (in/in) Secured With [employer] Sterile or Non-Sterile 6-ply 4.5x4 (yd/yd) Discharge Instruction: Apply Kerlix as directed Compression Wrap 3-LAYER WRAP - Profore Lite LF 3 Multilayer Compression Bandaging System Discharge Instruction: Apply 3 multi-layer wrap as prescribed. Compression  Stockings Add-Ons Electronic Signature(s) Signed: 08/23/2022 4:56:08 PM By: [name] Loud MSN RN CNS WTA Entered By: [name] on 08/23/2022 11:12:44 -------------------------------------------------------------------------------- Vitals Details Patient Name: Date of Service: [name], [name] 08/23/2022 10:45 [name] M Medical Record Number: [id number] Patient Account Number: [id number] Date of Birth/Sex: Treating RN: 10-30-1945 (76 y.o. [name] Primary Care Loralye Loberg: [name] Other Clinician: Referring Colbey Wirtanen: Treating Jahquez Steffler/Extender: [name] in Treatment: 3 Vital Signs Time Taken: 10:52 Temperature (F): 97.6 Height (in): 75 Pulse (bpm): 80 Weight (lbs): 225 Respiratory Rate (breaths/min): 16 Body Mass Index (BMI): 28.1 Blood Pressure (mmHg): 131/80 Reference Range: 80 - 120 mg / dl Electronic Signature(s) Signed: 08/23/2022 4:56:08 PM By: [name] Loud MSN RN CNS WTA Entered By: [name] on 08/23/2022 10:54:38  Tendon: No Muscle: No Muscle: No Muscle: No Joint: No Joint: No Joint: No Bone: No Bone: No Bone: No N/[name] Small (1-33%) None Epithelialization: Wound Number: '13 14 9 '$ Photos: Right, Midline Lower Leg Right, Lateral Lower Leg Left, Lateral Lower Leg Wound Location: Gradually Appeared Gradually Appeared Gradually Appeared 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 Malignant Wound Primary Etiology: Arrhythmia, Hypertension, Gout Arrhythmia, Hypertension, Gout Arrhythmia, Hypertension, Gout Comorbid History: 01/22/2022 01/20/2022 01/20/2022 Date Acquired: '3 3 3 '$ Weeks of  Treatment: Open Open Open Wound Status: No No No Wound Recurrence: 0.6x0.5x0.1 0.4x0.4x0.21 5x3.4x0.1 Measurements L x W x D (cm) 0.236 0.126 13.352 [name] (cm) : rea 0.024 0.026 1.335 Volume (cm) : 62.40% 75.00% 24.00% % Reduction in [name] rea: 61.90% 74.30% 62.00% % Reduction in Volume: Full Thickness Without Exposed Full Thickness Without Exposed Full Thickness Without Exposed Classification: Support Structures Support Structures Support Structures Large Medium Large Exudate Amount: Serous Serous Serosanguineous Exudate Type: amber amber red, brown Exudate Color: N/[name] N/[name] Thickened Wound Margin: N/[name] Medium (34-66%) Medium (34-66%) Granulation Amount: N/[name] Red Red, Hyper-granulation Granulation Quality: N/[name] Medium (34-66%) Medium (34-66%) Necrotic Amount: N/[name] Fat Layer (Subcutaneous Tissue): Yes Fat Layer (Subcutaneous Tissue): Yes Exposed Structures: Fascia: No Fascia: No Tendon: No Tendon: No Muscle: No Muscle: No Joint: No Joint: No Bone: No Bone: No N/[name] Small (1-33%) Medium (34-66%) Epithelialization: Treatment Notes Electronic Signature(s) Signed: 08/23/2022 4:56:08 PM By: [name] Loud MSN RN CNS WTA Entered By: [name] on 08/23/2022 11:30:17 -------------------------------------------------------------------------------- Multi-Disciplinary Care Plan Details Patient Name: Date of Service: [name] 08/23/2022 10:45 [name] M Medical Record Number: [id number] Patient Account Number: [id number] Date of Birth/Sex: Treating RN: 1946/09/10 (76 y.o. [name] Primary Care Justo Hengel: [name] Other Clinician: Referring Gurinder Toral: Treating Derian Pfost/Extender: Laurier, Jasperson, [place] ([id number]) [phone].pdf Page 8 of 17 Weeks in Treatment: 3 Active Inactive Electronic Signature(s) Signed: 08/23/2022 4:56:08 PM By: [name] Loud MSN RN CNS WTA Entered By: [name] on 08/23/2022  11:30:07 -------------------------------------------------------------------------------- Pain Assessment Details Patient Name: Date of Service: [name], [name] 08/23/2022 10:45 [name] M Medical Record Number: [id number] Patient Account Number: [id number] Date of Birth/Sex: Treating RN: June 29, 1946 (76 y.o. [name] Primary Care Burgess Sheriff: [name] Other Clinician: Referring Brysun Eschmann: Treating Aeson Sawyers/Extender: [name] in Treatment: 3 Active Problems Location of Pain Severity and Description of Pain Patient Has Paino No Site Locations Pain Management and Medication Current Pain Management: Electronic Signature(s) Signed: 08/23/2022 4:56:08 PM By: [name] Loud MSN RN CNS WTA Entered By: [name] on 08/23/2022 10:54:45 [name], [name] ([id number]) [id number].pdf Page 9 of 17 -------------------------------------------------------------------------------- Patient/Caregiver Education Details Patient Name: Date of Service: [name] 11/16/2023andnbsp10:45 [name] M Medical Record Number: [id number] Patient Account Number: [id number] Date of Birth/Gender: Treating RN: [birth year] (76 y.o. [name] Primary Care Physician: [name] Other Clinician: Referring Physician: Treating Physician/Extender: [name] in Treatment: 3 Education Assessment Education Provided To: Patient and Caregiver Education Topics Provided Wound/Skin Impairment: Handouts: Caring for Your Ulcer Methods: Explain/Verbal Responses: State content correctly Electronic Signature(s) Signed: 08/23/2022 4:56:08 PM By: [name] Loud MSN RN CNS WTA Entered By: [name] on 08/23/2022 12:22:40 -------------------------------------------------------------------------------- Wound Assessment Details Patient Name: Date of Service: [name], [name] 08/23/2022 10:45 [name] M Medical Record Number: [id number] Patient  Account Number: [id number] Date of Birth/Sex: Treating RN: [birth year] (76 y.o. [name] Primary Care Margaretta Chittum: [name] Other Clinician:  wounds X- 1 5 Wound Imaging (photographs - any number of wounds) '[]'$  - 0 Wound Tracing (instead of photographs) '[]'$  - 0 Simple Wound Measurement - one wound X- 4 5 Complex Wound Measurement - multiple wounds INTERVENTIONS - Wound Dressings '[]'$  - 0 Small Wound Dressing one or multiple wounds X- 4 15 Medium Wound Dressing one or multiple wounds '[]'$  - 0 Large Wound Dressing one or multiple wounds '[]'$  - 0 Application of Medications - topical '[]'$  - 0 Application of Medications - injection INTERVENTIONS - Miscellaneous '[]'$  - 0 External ear exam [name], [name] ([id number]) [id number] Page 3 of 17 '[]'$  - 0 Specimen Collection (cultures, biopsies, blood, body fluids, etc.) '[]'$  - 0 Specimen(s) / Culture(s) sent or taken to Lab for analysis '[]'$  - 0 Patient Transfer (multiple staff / Harrel Lemon Lift / Similar devices) '[]'$  - 0 Simple Staple / Suture removal (25 or less) '[]'$  - 0 Complex Staple / Suture removal (26 or more) '[]'$  - 0 Hypo / Hyperglycemic Management (close monitor of Blood Glucose) '[]'$  - 0 Ankle / Brachial Index (ABI) - do not check if billed separately X- 1 5 Vital Signs Has the patient been seen at the hospital within the last three years: Yes Total Score: 170 Level Of Care: New/Established - Level 5 Electronic Signature(s) Signed: 08/23/2022 4:56:08 PM By: [name] Loud MSN RN CNS WTA Entered By: [name] on 08/23/2022 12:21:47 -------------------------------------------------------------------------------- Compression Therapy Details Patient Name: Date of Service: [name], [name] 08/23/2022 10:45 [name] M Medical Record Number: [id number] Patient Account Number: [id number] Date of Birth/Sex: Treating RN: [birth year] (76 y.o. [name] Primary Care  Kahne Helfand: [name] Other Clinician: Referring Jannett Schmall: Treating Cyndi Montejano/Extender: [name] in Treatment: 3 Compression Therapy Performed for Wound Assessment: Wound #10 Left,Medial Lower Leg Performed By: Clinician [name] Loud, RN Compression Type: Three Layer Post Procedure Diagnosis Same as Pre-procedure Electronic Signature(s) Signed: 08/23/2022 12:19:05 PM By: [name] Loud MSN RN CNS WTA Entered By: [name] on 08/23/2022 12:19:04 -------------------------------------------------------------------------------- Compression Therapy Details Patient Name: Date of Service: [name], [name] 08/23/2022 10:45 [name] M Medical Record Number: [id number] Patient Account Number: [id number] Date of Birth/Sex: Treating RN: [birth year] (76 y.o. [place], [place], [place] [name] ([id number]) [phone].pdf Page 4 of 17 Primary Care Zayana Salvador: [name] Other Clinician: Referring Brinsley Wence: Treating Shereka Lafortune/Extender: [name] in Treatment: 3 Compression Therapy Performed for Wound Assessment: Wound #13 Right,Midline Lower Leg Performed By: Clinician [name] Loud, RN Compression Type: Three Layer Post Procedure Diagnosis Same as Pre-procedure Electronic Signature(s) Signed: 08/23/2022 12:19:05 PM By: [name] Loud MSN RN CNS WTA Entered By: [name] on 08/23/2022 12:19:05 -------------------------------------------------------------------------------- Compression Therapy Details Patient Name: Date of Service: [name], [name] 08/23/2022 10:45 [name] M Medical Record Number: [id number] Patient Account Number: [id number] Date of Birth/Sex: Treating RN: 03/15/1946 (76 y.o. [name] Primary Care Ravan Schlemmer: [name] Other Clinician: Referring Wylder Macomber: Treating Kadeen Sroka/Extender: [name] in Treatment: 3 Compression Therapy Performed for Wound Assessment: Wound #14  Right,Lateral Lower Leg Performed By: Clinician [name] Loud, RN Compression Type: Three Layer Post Procedure Diagnosis Same as Pre-procedure Electronic Signature(s) Signed: 08/23/2022 12:19:05 PM By: [name] Loud MSN RN CNS WTA Entered By: [name] on 08/23/2022 12:19:05 -------------------------------------------------------------------------------- Compression Therapy Details Patient Name: Date of Service: [name], [name] 08/23/2022 10:45 [name] M Medical Record Number: [id number] Patient Account Number: [id number] Date of Birth/Sex: Treating RN: 1945/10/25 (76 y.o. M)  Treatment: 3 Clustered Wound: No Photos Wound Measurements Length: (cm) Width: (cm) Depth: (cm) Area: (cm) Volume: (cm) 0 % Reduction in Area: 100% 0 % Reduction in Volume: 100% 0 Epithelialization: None 0 0 Wound Description Classification: Full Thickness Without Exposed Support Structures Exudate Amount: Large Exudate Type: Serous Exudate Color: amber IVAR, [name] ([id number]) Wound Bed Granulation Amount: Medium (34-66%) Granulation Quality: Red Necrotic Amount: Medium (34-66%) Foul Odor After Cleansing: No Slough/Fibrino Yes [id number] Page 13 of 17 Exposed Structure Fascia Exposed: No Fat Layer (Subcutaneous Tissue) Exposed: No Tendon Exposed: No Muscle Exposed: No Joint Exposed: No Bone Exposed: No Treatment Notes Wound #12 (Lower Leg) Wound Laterality: Right, Medial Cleanser Peri-Wound Care Topical Primary Dressing Secondary Dressing Secured With Compression Wrap Compression Stockings Add-Ons Electronic Signature(s) Signed: 08/23/2022 4:56:08 PM By: [name] Loud MSN RN CNS  WTA Entered By: [name] on 08/23/2022 11:29:49 -------------------------------------------------------------------------------- Wound Assessment Details Patient Name: Date of Service: [name], [name] 08/23/2022 10:45 [name] M Medical Record Number: [id number] Patient Account Number: [id number] Date of Birth/Sex: Treating RN: [birth year] (76 y.o. [name] Primary Care Alonnah Lampkins: [name] Other Clinician: Referring Ronin Rehfeldt: Treating Christien Frankl/Extender: [name] in Treatment: 3 Wound Status Wound Number: 13 Primary Etiology: Venous Leg Ulcer Wound Location: Right, Midline Lower Leg Wound Status: Open Wounding Event: Gradually Appeared Comorbid History: Arrhythmia, Hypertension, Gout Date Acquired: 01/22/2022 Weeks Of Treatment: 3 Clustered Wound: No Photos [name], [name] ([id number]) [id number] Page 14 of 17 Wound Measurements Length: (cm) 0.6 Width: (cm) 0.5 Depth: (cm) 0.1 Area: (cm) 0.236 Volume: (cm) 0.024 % Reduction in Area: 62.4% % Reduction in Volume: 61.9% Wound Description Classification: Full Thickness Without Exposed Support Exudate Amount: Large Exudate Type: Serous Exudate Color: amber Structures Treatment Notes Wound #13 (Lower Leg) Wound Laterality: Right, Midline Cleanser Peri-Wound Care Topical Primary Dressing Secondary Dressing Secured With Compression Wrap Compression Stockings Add-Ons Electronic Signature(s) Signed: 08/23/2022 4:56:08 PM By: [name] Loud MSN RN CNS WTA Entered By: [name] on 08/23/2022 11:15:53 -------------------------------------------------------------------------------- Wound Assessment Details Patient Name: Date of Service: [name], [name] 08/23/2022 10:45 [name] M Medical Record Number: [id number] Patient Account Number: [id number] Date of Birth/Sex: Treating RN: 1945/11/23 (76 y.o. [name] Primary Care Weronika Birch: [name] Other  Clinician: Referring Levante Simones: Treating Jacobi Nile/Extender: [name] in Treatment: 3 Wound Status Wound Number: 14 Primary Etiology: Venous Leg Ulcer [name], [name] ([id number]) [id number].pdf Page 15 of 17 Wound Location: Right, Lateral Lower Leg Wound Status: Open Wounding Event: Gradually Appeared Comorbid History: Arrhythmia, Hypertension, Gout Date Acquired: 01/20/2022 Weeks Of Treatment: 3 Clustered Wound: No Photos Wound Measurements Length: (cm) 0.4 Width: (cm) 0.4 Depth: (cm) 0.21 Area: (cm) 0.126 Volume: (cm) 0.026 % Reduction in Area: 75% % Reduction in Volume: 74.3% Epithelialization: Small (1-33%) Wound Description Classification: Full Thickness Without Exposed Suppor Exudate Amount: Medium Exudate Type: Serous Exudate Color: amber t Structures Foul Odor After Cleansing: No Slough/Fibrino No Wound Bed Granulation Amount: Medium (34-66%) Exposed Structure Granulation Quality: Red Fascia Exposed: No Necrotic Amount: Medium (34-66%) Fat Layer (Subcutaneous Tissue) Exposed: Yes Necrotic Quality: Adherent Slough Tendon Exposed: No Muscle Exposed: No Joint Exposed: No Bone Exposed: No Treatment Notes Wound #14 (Lower Leg) Wound Laterality: Right, Lateral Cleanser Peri-Wound Care Topical Primary Dressing Secondary Dressing Secured With Compression Wrap Compression Stockings Add-Ons Electronic Signature(s) Signed: 08/23/2022 4:56:08 PM By: [name] Loud MSN RN CNS WTA Entered By: [name] on 08/23/2022 11:15:21 [name] ([id number]) [id number] Page 16

## 2022-08-23 NOTE — Progress Notes (Addendum)
[name], [name] ([id number]) [id number] Page 1 of 10 Visit Report for 08/23/2022 Chief Complaint Document Details Patient Name: Date of Service: [name] 08/23/2022 10:45 [name] Medical Record Number: [id number] Patient Account Number: [id number] Date of Birth/Sex: Treating RN: 1946-05-15 (76 y.o. [name] Primary Care Provider: [name] Other Clinician: Referring Provider: Treating Provider/Extender: [name] in Treatment: 3 Information Obtained from: Patient Chief Complaint Bilateral LE Ulcers Electronic Signature(s) Signed: 08/23/2022 12:23:23 PM By: [name] MSN RN CNS WTA Signed: 08/24/2022 2:32:55 PM By: [name] PA-C Previous Signature: 08/23/2022 10:37:48 AM Version By: [name] PA-C Entered By: [name] on 08/23/2022 12:23:23 -------------------------------------------------------------------------------- HPI Details Patient Name: Date of Service: [name], [name] 08/23/2022 10:45 [name] Medical Record Number: [id number] Patient Account Number: [id number] Date of Birth/Sex: Treating RN: 01-Jun-1946 (76 y.o. [name] Primary Care Provider: [name] Other Clinician: Referring Provider: Treating Provider/Extender: [name] in Treatment: 3 History of Present Illness HPI Description: 76 year old male who has [name] past medical history of essential hypertension, chronic atrial fibrillation, peripheral vascular disease, nonischemic cardiomyopathy,venous stasis dermatitis, gouty arthropathy, basal cell carcinoma of the right lower extremity, benign prostatic hypertrophy, long- term use of anticoagulation therapy, hyperglycemia and exercise intolerance has never been [name] smoker. the patient has had [name] vascular workup over 7 years ago and said everything was normal at that stage. He does not have any chronic problems except for cardiac issues which he sees [name]  cardiologist in [place]. 08/15/2017 -- arterial and venous duplex studies still pending. 08/23/2017 -- venous reflux studies done on 08/13/2017 shows venous incompetence throughout the left lower extremity deep system and focally at the left saphenofemoral junction. 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 No evidence of SVT or DVT in bilateral lower extremities The patient has an appointment at the end of the month to get his arterial duplex study done 09/05/2017 -- the patient was seen at the vein and vascular office yesterday by Angelena Form. ABI studies were notable for medial calcification and the toe brachial indices were normal and bilateral ankle-brachial) waveforms were normal with triphasic flow. After review of his venous studies he was not [name] candidate for laser ablation and his lymphedema was to be treated with compression stockings and lymphedema pump pumps [name] ([id number]) [id number] Page 2 of 10 09/12/2017 -- had [name] low arterial study done at the  vein and vascular surgery -- unable to obtain reliable ABI is due to medial calcification. Bilateral toe indices were normal with the right being 1.01 and the left being 0.92 and the waveforms were triphasic bilaterally. he did get hold of 30-40 mm compression stockings but is unable to put these on. We will try and get him alternative compression stockings. 09/26/17- he is here in follow up evaluation of [name] right lower extremity ulcer;he is compliant in wearing compression stocking; ulcer almost epithelialized , anticipate healing next appointment Readmission: 11/17 point upon evaluation patient's wound currently that he is seeing [place] for today is [name] skin cancerous lesion that was cleared away by his dermatologist on the left medial calf region. He tells me that this is [name] very similar thing to what he had done previously in fact the last time he saw him in 2018 this was  also what was going on at that point. Nonetheless he feels that based on what he seeing currently that this is just  having [name] lot of harder time healing although it is much closer to the surface than what he is experienced in the past. He notes that the initial removal was in June 2022 which was this year this is now November and still has not closed. He does have some edema and definitely I think that there is some venous component to his slow healing here. Also think that we can do something better than Vaseline to try to help with getting this to clear up as quickly as possible. He does have [name] history of atrial fibrillation and is on Eliquis otherwise he really has no major medical problems that would affect wound healing. 09/07/2021 upon evaluation today patient actually appears to be doing significantly better after having wrapped him last week. Overall I think that this is making significant improvements at this time which is great news. I do not see any evidence of infection which is great news as well. No fevers, chills, nausea, vomiting, or diarrhea. 09/14/2021 upon evaluation today patient appears to be doing well with regard to his leg ulcer. He has been tolerating the dressing changes and overall I think that he is making excellent progress. I do not see any signs of active infection at this time. 09/21/2021 upon evaluation today patient actually appears to be making good progress with regard to his wound this is again measuring smaller today no debridement seems to be necessary. We have been using [name] silver collagen dressing and I think that is doing an awesome job. 09/28/2021 upon evaluation today patient appears to be doing well with regard to his leg currently. I do not see any signs of active infection at this time which is great news. No fevers, chills, nausea, vomiting, or diarrhea. I think this wound is very close to complete resolution. 10/12/2021 upon evaluation today patient actually  appears to be doing awesome in regard to his leg ulcer. In fact this appears to be completely healed based on what I am seeing currently. I do not see any evidence of active infection locally nor systemically at this time which is also great news. No fevers, chills, nausea, vomiting, or diarrhea. Readmission: 12/07/2021 upon evaluation today patient presents for readmission here in the clinic. He was discharged on 10/12/2021 is completely healed. Unfortunately this has reopened at this point and he is having continual issues with new blisters over both lower extremities. This is even worse than what we previously saw. Nonetheless we did actually check his ABIs today and it did reveal that his ABIs were 0.55 on the left and 0.57 on the right. Subsequently this is [name] definite change from his last arterial study which showed that he did have good blood flow at 1.01 on the right and 0.92 on the left and that was right at the beginning of 2019. Nonetheless based on what we see currently I do think he tolerated the 3 layer compression wrap but I do believe that we probably need to get him tested for his arterial flow in order to see where things stand and if there is something we can do there that would help prevent this from continue to be an ongoing issue. He did not utilize compression socks in the interim from when he was last here till this time. That something is probably going to need lifelong going forward as well. 3/9; patient presents for follow-up. He has no issues or complaints today. He tolerated the compression wrap well. He had ABIs with TBI's done. He  denies signs of infection. 12/21/2021 upon evaluation today patient appears to be doing well with regard to the wounds on his legs. Both are showing signs of significant improvement which is great news although I do believe some sharp debridement would be of benefit here as well. 12/28/2021 upon evaluation today patient appears to be doing well with  regard to his wounds. Everything is showing signs of excellent improvement which I am very pleased about. I think that we are headed in the right direction here. Fortunately there does not appear to be any evidence of infection which is great news there is [name] little bit of hypergranulation. 01/04/2022 upon evaluation today patient appears to be doing well with regard to his wounds 2 of them are healed 1 is almost so and the other 1 is significantly better. Overall I am extremely pleased with where we stand and I think that he is making excellent progress here. I do not see any evidence of active infection locally nor systemically at this time. 01-16-2022 upon evaluation today patient's wound on the left leg is showing signs of doing quite well. Has not completely cleared at this point but it is much improved. Fortunately I do not see any signs of infection at this time. No fevers, chills, nausea, vomiting, or diarrhea. 01-23-2022 upon evaluation today patient's wound of the left leg actually appears to be pretty much completely healed which is great news. I do not see any signs of active infection locally or systemically which is excellent. With that being said on the right leg what wound is measuring smaller the other 1 is [name] new wound that just showed up fortunately its not too bad. Has been using Xeroform here and that seems to be doing decently well which is great news. Unfortunately his blood pressure is significantly high we gave him the readings for the past 4-5 visits as well as [name] recommendation to make an appointment to go discuss this with his primary care provider patient states that he is going to look into doing this. 01-30-2022 upon evaluation today patient appears to be doing well with regard to his left leg everything appears to be healed. On the right leg the more anterior wound is healed the more medial wound that I been concerned about [name] possible skin cancer unfortunately still does not  look great to me. I do believe that we should probably do [name] biopsy I have talked about it with him [name] few times I think though it is probably time to go ahead and do this at this point. 02-09-2022 upon evaluation today patient appears to be doing well with regard to his legs. On the left this appears to be completely healed. On the right he does have 2 areas and be perfectly honest one of them is [name] skin cancer that he is going to the Mohs surgery clinic for the other seems to be healing nicely. Readmission: 08-02-2022 upon evaluation today patient appears for reevaluation here in our clinic concerning issues that he has been having with wounds over the bilateral lower extremities. I last saw him in May 2023 and at that point we had him completely healed. Unfortunately he is tells me this has broken down to some degree since that point. 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 He tells me this 1 seems to just be getting bigger and bigger not improving. Fortunately he is not having any significant pain which is good news he does have quite [name] bit of swelling and he tells me that his fluid pills are not recommended for him to take daily but just in 3-day intervals here and there. 08-09-2022 upon evaluation today patient appears to be doing still somewhat poorly in regard to his legs although in general he does not appear to be feeling as good as he has been. Fortunately there does not appear to be any signs of infection which is good news. With that being said he is having some issues here with having and overall poor feeling in general which again is good I think going to be the biggest complicating factor. He actually seems to be coughing I do not hear any wheezing right now I did listen to his chest he did  not have good airflow down low however makes me suspicious for bronchitis or even possibly pneumonia which could be part of what is going on here as well. Fortunately I do not see any evidence of anything worsening in regard to his legs but I definitely believe that he needs to continue with the compression wraps he took them off yesterday to shower has not had anything on for 24 hours that is why his legs are so swollen today. [name], [name] ([id number]) [id number] Page 3 of 10 With regard to his pathology report I did review that it showed some squamous abnormality but no signs of distinct carcinoma. With that being said it was saying that it could be adjacent to [name] squamous cell carcinoma nonetheless my suggestion is can be that we have the patient take copy of this report and give it to his Mohs surgeon in order for them to see if there is anything they feel like needs to be done further. With that being said right now I feel like the primary thing is going to be for [place] to try to get his swelling down and keep that down into that hand since he is having so much drainage I believe we can have to bring him in for dressing changes twice [name] week doing [name] nurse visit on Mondays. 11/9; since the patient was last here he spent the night in the emergency room he received IV Lasix. Also received antibiotics although he was not discharged on either 1 of these. He also saw his cardiology office who put him on regular Lasix 20 mg [previously on as needed Lasix 20 mg]. Per our intake nurse the swelling in his legs is remarkably better but he still has bilateral lower extremity wounds. He still has wounds on the bilateral lower extremities most problematically on the left lateral calf. He has been using silver alginate under 3 layer compression. 08-23-2022 upon evaluation today patient appears to be doing much better than the last time I saw him 2 weeks ago. At that point I was very  concerned about how he was doing he did see Dr. Caryn Section his primary care provider they got him on some blood pressure medication in general his color and overall appearance looks to be doing much improved compared to the last time I saw him. Electronic Signature(s) Signed: 08/24/2022 2:19:55 PM By: [name] PA-C Entered By: [name] on 08/24/2022 14:19:55 -------------------------------------------------------------------------------- Physical Exam Details Patient Name: Date of Service: [name] [place], [name] 08/23/2022 10:45 [name]  [name] Medical Record Number: [id number] Patient Account Number: [id number] Date of Birth/Sex: Treating RN: [birth year] (76 y.o. [name] Primary Care Provider: [name] Other Clinician: Referring Provider: Treating Provider/Extender: [name] in Treatment: 3 Constitutional Well-nourished and well-hydrated in no acute distress. Respiratory normal breathing without difficulty. Psychiatric this patient is able to make decisions and demonstrates good insight into disease process. Alert and Oriented x 3. pleasant and cooperative. Notes Upon inspection patient's wounds on each of the legs actually appear to be doing much better in fact I think were pretty much about healed at most of the locations except for the left lateral leg which is still the largest the rest of these are for the most part either healed or so close to healed but I think they probably will be by next week. His breathing is also better. Electronic Signature(s) Signed: 08/24/2022 2:20:19 PM By: [name] PA-C Entered By: [name] on 08/24/2022 14:20:18 Physician Orders Details -------------------------------------------------------------------------------- [name] ([id number]) [id number] Page 4 of 10 Patient Name: Date of Service: [name] 08/23/2022 10:45 [name] Medical Record Number:  [id number] Patient Account Number: [id number] Date of Birth/Sex: Treating RN: October 07, 1946 (76 y.o. [name] Primary Care Provider: [name] Other Clinician: Referring Provider: Treating Provider/Extender: [name] in Treatment: 3 Verbal / Phone Orders: No Diagnosis Coding ICD-10 Coding Code Description [phone] Chronic venous hypertension (idiopathic) with ulcer and inflammation of bilateral lower extremity L97.822 Non-pressure chronic ulcer of other part of left lower leg with fat layer exposed L97.812 Non-pressure chronic ulcer of other part of right lower leg with fat layer exposed I73.89 Other specified peripheral vascular diseases I48.0 Paroxysmal atrial fibrillation I10 Essential (primary) hypertension Follow-up Appointments ppointment in: - Monday nurse visit Return [name] Nurse Visit as needed Bathing/ Shower/ Hygiene May shower with wound dressing protected with water repellent cover or cast protector. No tub bath. Anesthetic (Use 'Patient Medications' Section for Anesthetic Order Entry) Wound #9 Left,Lateral Lower Leg Injectable lidocaine applied before procedure. Edema Control - Lymphedema / Segmental Compressive Device / Other Bilateral Lower Extremities Elevate, Exercise Daily and [name] void Standing for Long Periods of Time. Elevate legs to the level of the heart and pump ankles as often as possible Elevate leg(s) parallel to the floor when sitting. DO YOUR BEST to sleep in the bed at night. DO NOT sleep in your recliner. Long hours of sitting in [name] recliner leads to swelling of the legs and/or potential wounds on your backside. Additional Orders / Instructions Follow Nutritious Diet and Increase Protein Intake Wound Treatment Wound #10 - Lower Leg Wound Laterality: Left, Medial Cleanser: Soap and Water 1 x Per Week/15 Days Discharge Instructions: Gently cleanse wound with antibacterial soap, rinse and pat dry prior to dressing  wounds Peri-Wound Care: Moisturizing Lotion 1 x Per Week/15 Days Discharge Instructions: Suggestions: Theraderm, Eucerin, Cetaphil, or patient preference. Prim Dressing: Silvercel Small 2x2 (in/in) (Generic) 1 x Per Week/15 Days ary Discharge Instructions: Apply Silvercel Small 2x2 (in/in) as instructed Secondary Dressing: ABD Pad 5x9 (in/in) 1 x Per Week/15 Days Discharge Instructions: Cover with ABD pad Compression Wrap: 3-LAYER WRAP - Profore Lite LF 3 Multilayer Compression Bandaging System 1 x Per Week/15 Days Discharge Instructions: Apply 3 multi-layer wrap as prescribed. Wound #9 - Lower Leg Wound Laterality: Left, Lateral Prim Dressing: Hydrofera Blue Ready Transfer Foam, 4x5 (in/in) 1 x Per Week/30 Days ary Discharge Instructions: Apply Hydrofera Blue Ready to wound  bed as directed Secondary Dressing: Zetuvit Plus 4x8 (in/in) 1 x Per Week/30 Days Secured With: [employer] Sterile or Non-Sterile 6-ply 4.5x4 (yd/yd) 1 x Per Week/30 Days Discharge Instructions: Apply Kerlix as directed Compression Wrap: 3-LAYER WRAP - Profore Lite LF 3 Multilayer Compression Bandaging System 1 x Per Week/30 Days Discharge Instructions: Apply 3 multi-layer wrap as prescribed. Electronic Signature(s) [name], [name] ([id number]) [id number] Page 5 of 10 Signed: 08/23/2022 4:56:08 PM By: [name] MSN RN CNS WTA Signed: 08/24/2022 2:32:55 PM By: [name] PA-C Entered By: [name] on 08/23/2022 12:02:27 -------------------------------------------------------------------------------- Problem List Details Patient Name: Date of Service: [name] [name], [name] 08/23/2022 10:45 [name] Medical Record Number: [id number] Patient Account Number: [id number] Date of Birth/Sex: Treating RN: [birth year] (76 y.o. [name] Primary Care Provider: [name] Other Clinician: Referring Provider: Treating Provider/Extender: [name] in Treatment:  3 Active Problems ICD-10 Encounter Code Description Active Date MDM Diagnosis [phone] Chronic venous hypertension (idiopathic) with ulcer and inflammation of 08/02/2022 No Yes bilateral lower extremity L97.822 Non-pressure chronic ulcer of other part of left lower leg with fat layer 08/02/2022 No Yes exposed L97.812 Non-pressure chronic ulcer of other part of right lower leg with fat layer 08/02/2022 No Yes exposed I73.89 Other specified peripheral vascular diseases 08/02/2022 No Yes I48.0 Paroxysmal atrial fibrillation 08/02/2022 No Yes I10 Essential (primary) hypertension 08/02/2022 No Yes Inactive Problems Resolved Problems Electronic Signature(s) Signed: 08/23/2022 12:23:10 PM By: [name] MSN RN CNS WTA Signed: 08/24/2022 2:32:55 PM By: [name] PA-C Previous Signature: 08/23/2022 10:37:44 AM Version By: [name] PA-C Entered By: [name] on 08/23/2022 12:23:10 [name], [name] ([id number]) [id number] Page 6 of 10 -------------------------------------------------------------------------------- Progress Note Details Patient Name: Date of Service: [name] 08/23/2022 10:45 [name] Medical Record Number: [id number] Patient Account Number: [id number] Date of Birth/Sex: Treating RN: Aug 16, 1946 (76 y.o. [name] Primary Care Provider: [name] Other Clinician: Referring Provider: Treating Provider/Extender: [name] in Treatment: 3 Subjective Chief Complaint Information obtained from Patient Bilateral LE Ulcers History of Present Illness (HPI) 76 year old male who has [name] past medical history of essential hypertension, chronic atrial fibrillation, peripheral vascular disease, nonischemic cardiomyopathy,venous stasis dermatitis, gouty arthropathy, basal cell carcinoma of the right lower extremity, benign prostatic hypertrophy, long-term use of anticoagulation therapy, hyperglycemia and  exercise intolerance has never been [name] smoker. the patient has had [name] vascular workup over 7 years ago and said everything was normal at that stage. He does not have any chronic problems except for cardiac issues which he sees [name] cardiologist in [place]. 08/15/2017 -- arterial and venous duplex studies still pending. 08/23/2017 -- venous reflux studies done on 08/13/2017 shows venous incompetence throughout the left lower extremity deep system and focally at the left saphenofemoral junction. 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 No evidence of SVT or DVT in bilateral lower extremities The patient has an appointment at the end of the month to get his arterial duplex study done 09/05/2017 -- the patient was seen at the vein and vascular office yesterday by Angelena Form. ABI studies were notable for medial calcification and the toe brachial indices were normal and bilateral ankle-brachial) waveforms were normal with triphasic flow. After review of his venous studies he was not [name] candidate for laser ablation and his lymphedema was to be treated with compression stockings and lymphedema pump pumps 09/12/2017 -- had [name] low arterial study done  at the [facility] [facility] -- unable to obtain reliable ABI is due to medial calcification. Bilateral toe indices were normal with the right being 1.01 and the left being 0.92 and the waveforms were triphasic bilaterally. he did get hold of 30-40 mm compression stockings but is unable to put these on. We will try and get him alternative compression stockings. 09/26/17- he is here in follow up evaluation of [name] right lower extremity ulcer;he is compliant in wearing compression stocking; ulcer almost epithelialized , anticipate healing next appointment Readmission: 11/17 point upon evaluation patient's wound currently that he is seeing [place] for today is [name] skin cancerous lesion that was cleared away by his dermatologist on the left  medial calf region. He tells me that this is [name] very similar thing to what he had done previously in fact the last time he saw him in 2018 this was also what was going on at that point. Nonetheless he feels that based on what he seeing currently that this is just having [name] lot of harder time healing although it is much closer to the surface than what he is experienced in the past. He notes that the initial removal was in June 2022 which was this year this is now November and still has not closed. He does have some edema and definitely I think that there is some venous component to his slow healing here. Also think that we can do something better than Vaseline to try to help with getting this to clear up as quickly as possible. He does have [name] history of atrial fibrillation and is on Eliquis otherwise he really has no major medical problems that would affect wound healing. 09/07/2021 upon evaluation today patient actually appears to be doing significantly better after having wrapped him last week. Overall I think that this is making significant improvements at this time which is great news. I do not see any evidence of infection which is great news as well. No fevers, chills, nausea, vomiting, or diarrhea. 09/14/2021 upon evaluation today patient appears to be doing well with regard to his leg ulcer. He has been tolerating the dressing changes and overall I think that he is making excellent progress. I do not see any signs of active infection at this time. 09/21/2021 upon evaluation today patient actually appears to be making good progress with regard to his wound this is again measuring smaller today no debridement seems to be necessary. We have been using [name] silver collagen dressing and I think that is doing an awesome job. 09/28/2021 upon evaluation today patient appears to be doing well with regard to his leg currently. I do not see any signs of active infection at this time which is great news. No  fevers, chills, nausea, vomiting, or diarrhea. I think this wound is very close to complete resolution. 10/12/2021 upon evaluation today patient actually appears to be doing awesome in regard to his leg ulcer. In fact this appears to be completely healed based on what I am seeing currently. I do not see any evidence of active infection locally nor systemically at this time which is also great news. No fevers, chills, nausea, vomiting, or diarrhea. Readmission: 12/07/2021 upon evaluation today patient presents for readmission here in the clinic. He was discharged on 10/12/2021 is completely healed. Unfortunately this has reopened at this point and he is having continual issues with new blisters over both lower extremities. This is even worse than what we previously saw. Nonetheless we did actually  check his ABIs today and it did reveal that his ABIs were 0.55 on the left and 0.57 on the right. Subsequently this is [name] definite change from his last arterial study which showed that he did have good blood flow at 1.01 on the right and 0.92 on the left and that was right at the beginning of 2019. Nonetheless based on what we see currently I do think he tolerated the 3 layer compression wrap but I do believe that we probably need to get him tested for his arterial flow in order to see where things stand and if there is something we can do there that would help prevent this from continue to be an [name], [name] ([id number]) [id number] Page 7 of 10 ongoing issue. He did not utilize compression socks in the interim from when he was last here till this time. That something is probably going to need lifelong going forward as well. 3/9; patient presents for follow-up. He has no issues or complaints today. He tolerated the compression wrap well. He had ABIs with TBI's done. He denies signs of infection. 12/21/2021 upon evaluation today patient appears to be doing well with regard to the  wounds on his legs. Both are showing signs of significant improvement which is great news although I do believe some sharp debridement would be of benefit here as well. 12/28/2021 upon evaluation today patient appears to be doing well with regard to his wounds. Everything is showing signs of excellent improvement which I am very pleased about. I think that we are headed in the right direction here. Fortunately there does not appear to be any evidence of infection which is great news there is [name] little bit of hypergranulation. 01/04/2022 upon evaluation today patient appears to be doing well with regard to his wounds 2 of them are healed 1 is almost so and the other 1 is significantly better. Overall I am extremely pleased with where we stand and I think that he is making excellent progress here. I do not see any evidence of active infection locally nor systemically at this time. 01-16-2022 upon evaluation today patient's wound on the left leg is showing signs of doing quite well. Has not completely cleared at this point but it is much improved. Fortunately I do not see any signs of infection at this time. No fevers, chills, nausea, vomiting, or diarrhea. 01-23-2022 upon evaluation today patient's wound of the left leg actually appears to be pretty much completely healed which is great news. I do not see any signs of active infection locally or systemically which is excellent. With that being said on the right leg what wound is measuring smaller the other 1 is [name] new wound that just showed up fortunately its not too bad. Has been using Xeroform here and that seems to be doing decently well which is great news. Unfortunately his blood pressure is significantly high we gave him the readings for the past 4-5 visits as well as [name] recommendation to make an appointment to go discuss this with his primary care provider patient states that he is going to look into doing this. 01-30-2022 upon evaluation today patient  appears to be doing well with regard to his left leg everything appears to be healed. On the right leg the more anterior wound is healed the more medial wound that I been concerned about [name] possible skin cancer unfortunately still does not look great to me. I do believe that we should probably do [name] biopsy I  have talked about it with him [name] few times I think though it is probably time to go ahead and do this at this point. 02-09-2022 upon evaluation today patient appears to be doing well with regard to his legs. On the left this appears to be completely healed. On the right he does have 2 areas and be perfectly honest one of them is [name] skin cancer that he is going to the Mohs surgery clinic for the other seems to be healing nicely. Readmission: 08-02-2022 upon evaluation today patient appears for reevaluation here in our clinic concerning issues that he has been having with wounds over the bilateral lower extremities. I last saw him in May 2023 and at that point we had him completely healed. Unfortunately he is tells me this has broken down to some degree since that point. 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 He tells me this 1 seems to just be getting bigger and bigger not improving. Fortunately he is not having any significant pain which is good news he does have quite [name] bit of swelling and he tells me that his fluid pills are not recommended for him to take daily but just in 3-day intervals here and there. 08-09-2022 upon evaluation today patient appears to be doing still somewhat poorly in regard to his legs although in general he does not appear to be feeling as good as he has been. Fortunately there does not appear to be any signs of infection which is good news. With that being said he is having  some issues here with having and overall poor feeling in general which again is good I think going to be the biggest complicating factor. He actually seems to be coughing I do not hear any wheezing right now I did listen to his chest he did not have good airflow down low however makes me suspicious for bronchitis or even possibly pneumonia which could be part of what is going on here as well. Fortunately I do not see any evidence of anything worsening in regard to his legs but I definitely believe that he needs to continue with the compression wraps he took them off yesterday to shower has not had anything on for 24 hours that is why his legs are so swollen today. With regard to his pathology report I did review that it showed some squamous abnormality but no signs of distinct carcinoma. With that being said it was saying that it could be adjacent to [name] squamous cell carcinoma nonetheless my suggestion is can be that we have the patient take copy of this report and give it to his Mohs surgeon in order for them to see if there is anything they feel like needs to be done further. With that being said right now I feel like the primary thing is going to be for [place] to try to get his swelling down and keep that down into that hand since he is having so much drainage I believe we can have to bring him in for dressing changes twice [name] week doing [name] nurse visit on Mondays. 11/9; since the patient was last here he spent the night in the emergency room he received IV Lasix. Also received antibiotics although he was not discharged on either 1 of these.  He also saw his cardiology office who put him on regular Lasix 20 mg [previously on as needed Lasix 20 mg]. Per our intake nurse the swelling in his legs is remarkably better but he still has bilateral lower extremity wounds. He still has wounds on the bilateral lower extremities most problematically on the left lateral calf. He has been using silver alginate under 3  layer compression. 08-23-2022 upon evaluation today patient appears to be doing much better than the last time I saw him 2 weeks ago. At that point I was very concerned about how he was doing he did see Dr. Caryn Section his primary care provider they got him on some blood pressure medication in general his color and overall appearance looks to be doing much improved compared to the last time I saw him. Objective Constitutional Well-nourished and well-hydrated in no acute distress. Vitals Time Taken: 10:52 AM, Height: 75 in, Weight: 225 lbs, BMI: 28.1, Temperature: 97.6 F, Pulse: 80 bpm, Respiratory Rate: 16 breaths/min, Blood Pressure: 131/80 mmHg. Respiratory normal breathing without difficulty. Psychiatric this patient is able to make decisions and demonstrates good insight into disease process. Alert and Oriented x 3. pleasant and cooperative. General Notes: Upon inspection patient's wounds on each of the legs actually appear to be doing much better in fact I think were pretty much about healed at [name], [name] ([id number]) [id number] Page 8 of 10 most of the locations except for the left lateral leg which is still the largest the rest of these are for the most part either healed or so close to healed but I think they probably will be by next week. His breathing is also better. Integumentary (Hair, Skin) Wound #10 status is Open. Original cause of wound was Gradually Appeared. The date acquired was: 01/20/2022. The wound has been in treatment 3 weeks. The wound is located on the Left,Medial Lower Leg. The wound measures 1cm length x 1cm width x 0.1cm depth; 0.785cm^2 area and 0.079cm^3 volume. There is Fat Layer (Subcutaneous Tissue) exposed. There is [name] medium amount of serous drainage noted. Wound #11 status is Healed - Epithelialized. Original cause of wound was Gradually Appeared. The date acquired was: 01/20/2022. The wound has been in treatment 3 weeks. The  wound is located on the Left,Posterior Lower Leg. The wound measures 0cm length x 0cm width x 0cm depth; 0cm^2 area and 0cm^3 volume. There is Fat Layer (Subcutaneous Tissue) exposed. There is [name] medium amount of serous drainage noted. The wound margin is distinct with the outline attached to the wound base. There is small (1-33%) red granulation within the wound bed. There is no necrotic tissue within the wound bed. Wound #12 status is Healed - Epithelialized. Original cause of wound was Gradually Appeared. The date acquired was: 01/22/2022. The wound has been in treatment 3 weeks. The wound is located on the Right,Medial Lower Leg. The wound measures 0cm length x 0cm width x 0cm depth; 0cm^2 area and 0cm^3 volume. There is [name] large amount of serous drainage noted. There is medium (34-66%) red granulation within the wound bed. There is [name] medium (34-66%) amount of necrotic tissue within the wound bed. Wound #13 status is Open. Original cause of wound was Gradually Appeared. The date acquired was: 01/22/2022. The wound has been in treatment 3 weeks. The wound is located on the Right,Midline Lower Leg. The wound measures 0.6cm length x 0.5cm width x 0.1cm depth; 0.236cm^2 area and 0.024cm^3 volume. There is [name] large amount of serous drainage noted. Wound #  14 status is Open. Original cause of wound was Gradually Appeared. The date acquired was: 01/20/2022. The wound has been in treatment 3 weeks. The wound is located on the Right,Lateral Lower Leg. The wound measures 0.4cm length x 0.4cm width x 0.21cm depth; 0.126cm^2 area and 0.026cm^3 volume. There is Fat Layer (Subcutaneous Tissue) exposed. There is [name] medium amount of serous drainage noted. There is medium (34-66%) red granulation within the wound bed. There is [name] medium (34-66%) amount of necrotic tissue within the wound bed including Adherent Slough. Wound #9 status is Open. Original cause of wound was Gradually Appeared. The date acquired was: 01/20/2022.  The wound has been in treatment 3 weeks. The wound is located on the Left,Lateral Lower Leg. The wound measures 5cm length x 3.4cm width x 0.1cm depth; 13.352cm^2 area and 1.335cm^3 volume. There is Fat Layer (Subcutaneous Tissue) exposed. There is no tunneling or undermining noted. There is [name] large amount of serosanguineous drainage noted. The wound margin is thickened. There is medium (34-66%) red, hyper - granulation within the wound bed. There is [name] medium (34-66%) amount of necrotic tissue within the wound bed including Adherent Slough. Assessment Active Problems ICD-10 Chronic venous hypertension (idiopathic) with ulcer and inflammation of bilateral lower extremity Non-pressure chronic ulcer of other part of left lower leg with fat layer exposed Non-pressure chronic ulcer of other part of right lower leg with fat layer exposed Other specified peripheral vascular diseases Paroxysmal atrial fibrillation Essential (primary) hypertension Procedures Wound #10 Pre-procedure diagnosis of Wound #10 is [name] Venous Leg Ulcer located on the Left,Medial Lower Leg . There was [name] Three Layer Compression Therapy Procedure by [name], RN. Post procedure Diagnosis Wound #10: Same as Pre-Procedure Wound #13 Pre-procedure diagnosis of Wound #13 is [name] Venous Leg Ulcer located on the Right,Midline Lower Leg . There was [name] Three Layer Compression Therapy Procedure by [name], RN. Post procedure Diagnosis Wound #13: Same as Pre-Procedure Wound #14 Pre-procedure diagnosis of Wound #14 is [name] Venous Leg Ulcer located on the Right,Lateral Lower Leg . There was [name] Three Layer Compression Therapy Procedure by [name], RN. Post procedure Diagnosis Wound #14: Same as Pre-Procedure Wound #9 Pre-procedure diagnosis of Wound #9 is [name] Malignant Wound located on the Left,Lateral Lower Leg . There was [name] Three Layer Compression Therapy Procedure by [name], RN. Post procedure Diagnosis Wound #9: Same as  Pre-Procedure Plan Follow-up Appointments: Return Appointment in: - Monday nurse visit Nurse Visit as needed [name], [name] ([id number]) [id number] Page 9 of 10 Bathing/ Shower/ Hygiene: May shower with wound dressing protected with water repellent cover or cast protector. No tub bath. Anesthetic (Use 'Patient Medications' Section for Anesthetic Order Entry): Wound #9 Left,Lateral Lower Leg: Injectable lidocaine applied before procedure. Edema Control - Lymphedema / Segmental Compressive Device / Other: Elevate, Exercise Daily and Avoid Standing for Long Periods of Time. Elevate legs to the level of the heart and pump ankles as often as possible Elevate leg(s) parallel to the floor when sitting. DO YOUR BEST to sleep in the bed at night. DO NOT sleep in your recliner. Long hours of sitting in [name] recliner leads to swelling of the legs and/or potential wounds on your backside. Additional Orders / Instructions: Follow Nutritious Diet and Increase Protein Intake WOUND #10: - Lower Leg Wound Laterality: Left, Medial Cleanser: Soap and Water 1 x Per Week/15 Days Discharge Instructions: Gently cleanse wound with antibacterial soap, rinse and pat dry prior to dressing wounds Peri-Wound Care: Moisturizing Lotion 1  x Per Week/15 Days Discharge Instructions: Suggestions: Theraderm, Eucerin, Cetaphil, or patient preference. Prim Dressing: Silvercel Small 2x2 (in/in) (Generic) 1 x Per Week/15 Days ary Discharge Instructions: Apply Silvercel Small 2x2 (in/in) as instructed Secondary Dressing: ABD Pad 5x9 (in/in) 1 x Per Week/15 Days Discharge Instructions: Cover with ABD pad Com pression Wrap: 3-LAYER WRAP - Profore Lite LF 3 Multilayer Compression Bandaging System 1 x Per Week/15 Days Discharge Instructions: Apply 3 multi-layer wrap as prescribed. WOUND #9: - Lower Leg Wound Laterality: Left, Lateral Prim Dressing: Hydrofera Blue Ready Transfer Foam, 4x5 (in/in) 1 x  Per Week/30 Days ary Discharge Instructions: Apply Hydrofera Blue Ready to wound bed as directed Secondary Dressing: Zetuvit Plus 4x8 (in/in) 1 x Per Week/30 Days Secured With: [employer] Sterile or Non-Sterile 6-ply 4.5x4 (yd/yd) 1 x Per Week/30 Days Discharge Instructions: Apply Kerlix as directed Com pression Wrap: 3-LAYER WRAP - Profore Lite LF 3 Multilayer Compression Bandaging System 1 x Per Week/30 Days Discharge Instructions: Apply 3 multi-layer wrap as prescribed. 1. Based on what I am seeing I do believe that the patient would benefit from [name] continuation of therapy with regard to the compression wraps I do think this is doing [name] good job for him. 2. I am also can recommend specifically the Hydrofera Blue followed by Zetuvit and the 3 layer compression wrap. 3. I would also suggest he continue to elevate his legs is much as possible. We will see patient back for reevaluation in 1 week here in the clinic. If anything worsens or changes patient will contact our office for additional recommendations. Electronic Signature(s) Signed: 08/24/2022 2:20:42 PM By: [name] PA-C Entered By: [name] on 08/24/2022 14:20:42 -------------------------------------------------------------------------------- SuperBill Details Patient Name: Date of Service: [name] [name], [name] 08/23/2022 Medical Record Number: [id number] Patient Account Number: [id number] Date of Birth/Sex: Treating RN: 27-Jan-1946 (77 y.o. [name] Primary Care Provider: [name] Other Clinician: Referring Provider: Treating Provider/Extender: [name] in Treatment: 3 Diagnosis Coding ICD-10 Codes Code Description [id number] Chronic venous hypertension (idiopathic) with ulcer and inflammation of bilateral lower extremity L97.822 Non-pressure chronic ulcer of other part of left lower leg with fat layer exposed L97.812 Non-pressure chronic ulcer of other part of right lower leg  with fat layer exposed I73.89 Other specified peripheral vascular diseases BLASE, BECKNER ([id number]) [id number] Page 10 of 10 I48.0 Paroxysmal atrial fibrillation I10 Essential (primary) hypertension Facility Procedures : CPT4: Code [id number] Description: 15 - WOUND CARE VISIT-LEV 5 EST PT Modifier: Quantity: 1 : CPT4: [id number] Description: 81 BILATERAL: Application of multi-layer venous compression system; leg (below knee), including ankle and t. Modifier: Quantity: 1 Physician Procedures : CPT4 Code Description Modifier [id number] - WC PHYS LEVEL 3 - EST PT ICD-10 Diagnosis Description 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 L97.822 Non-pressure chronic ulcer of other  part of left lower leg with fat layer exposed L97.812 Non-pressure chronic ulcer of other part of right lower leg with fat layer exposed I73.89 Other specified peripheral vascular diseases Quantity: 1 Electronic Signature(s) Signed: 08/24/2022 2:22:59 PM By: [name] PA-C Previous Signature: 08/23/2022 12:22:23 PM Version By: [name] MSN RN CNS WTA Entered By: [name] on 08/24/2022 14:22:59

## 2022-08-23 NOTE — Telephone Encounter (Signed)
See lab results ./cy

## 2022-08-23 NOTE — Telephone Encounter (Signed)
   Pt's son returning call to get lab results and medications

## 2022-08-29 ENCOUNTER — Other Ambulatory Visit: Payer: Self-pay | Admitting: Cardiology

## 2022-08-29 ENCOUNTER — Other Ambulatory Visit: Payer: Self-pay

## 2022-08-29 DIAGNOSIS — I7389 Other specified peripheral vascular diseases: Secondary | ICD-10-CM | POA: Diagnosis not present

## 2022-08-29 DIAGNOSIS — I872 Venous insufficiency (chronic) (peripheral): Secondary | ICD-10-CM

## 2022-08-29 DIAGNOSIS — I10 Essential (primary) hypertension: Secondary | ICD-10-CM | POA: Diagnosis not present

## 2022-08-29 DIAGNOSIS — L97812 Non-pressure chronic ulcer of other part of right lower leg with fat layer exposed: Secondary | ICD-10-CM | POA: Diagnosis not present

## 2022-08-29 DIAGNOSIS — I87333 Chronic venous hypertension (idiopathic) with ulcer and inflammation of bilateral lower extremity: Secondary | ICD-10-CM | POA: Diagnosis not present

## 2022-08-29 DIAGNOSIS — Z85828 Personal history of other malignant neoplasm of skin: Secondary | ICD-10-CM | POA: Diagnosis not present

## 2022-08-29 DIAGNOSIS — L97822 Non-pressure chronic ulcer of other part of left lower leg with fat layer exposed: Secondary | ICD-10-CM | POA: Diagnosis not present

## 2022-08-29 DIAGNOSIS — I480 Paroxysmal atrial fibrillation: Secondary | ICD-10-CM | POA: Diagnosis not present

## 2022-08-29 MED ORDER — FUROSEMIDE 20 MG PO TABS: 20.0000 mg | ORAL_TABLET | Freq: Every day | ORAL | 3 refills | Status: DC

## 2022-08-29 NOTE — Telephone Encounter (Signed)
Pt's medication was sent to pt's pharmacy as requested. Confirmation received.  °

## 2022-08-30 NOTE — Progress Notes (Signed)
[name], [name] ([id number]) [id number] Page 1 of 2 Visit Report for 08/29/2022 Physician Orders Details Patient Name: Date of Service: [name] [name] 08/29/2022 4:00 PM Medical Record Number: [id number] Patient Account Number: [id number] Date of Birth/Sex: Treating RN: 11-02-1945 (76 y.o. [name] Primary Care Provider: [name] Other Clinician: Referring Provider: Treating Provider/Extender: Jeffie Pollock in Treatment: 3 Verbal / Phone Orders: No Diagnosis Coding Follow-up Appointments Return Appointment in 1 week. Bathing/ Shower/ Hygiene May shower with wound dressing protected with water repellent cover or cast protector. No tub bath. Anesthetic (Use 'Patient Medications' Section for Anesthetic Order Entry) Wound #9 Left,Lateral Lower Leg Injectable lidocaine applied before procedure. Edema Control - Lymphedema / Segmental Compressive Device / Other Bilateral Lower Extremities Elevate, Exercise Daily and [name] void Standing for Long Periods of Time. Elevate legs to the level of the heart and pump ankles as often as possible Elevate leg(s) parallel to the floor when sitting. DO YOUR BEST to sleep in the bed at night. DO NOT sleep in your recliner. Long hours of sitting in [name] recliner leads to swelling of the legs and/or potential wounds on your backside. Additional Orders / Instructions Follow Nutritious Diet and Increase Protein Intake Wound Treatment Wound #10 - Lower Leg Wound Laterality: Left, Medial Cleanser: Soap and Water 1 x Per Week/15 Days Discharge Instructions: Gently cleanse wound with antibacterial soap, rinse and pat dry prior to dressing wounds Peri-Wound Care: Moisturizing Lotion 1 x Per Week/15 Days Discharge Instructions: Suggestions: Theraderm, Eucerin, Cetaphil, or patient preference. Prim Dressing: Silvercel Small 2x2 (in/in) (Generic) 1 x Per Week/15 Days ary Discharge Instructions:  Apply Silvercel Small 2x2 (in/in) as instructed Secondary Dressing: ABD Pad 5x9 (in/in) 1 x Per Week/15 Days Discharge Instructions: Cover with ABD pad Compression Wrap: 3-LAYER WRAP - Profore Lite LF 3 Multilayer Compression Bandaging System 1 x Per Week/15 Days Discharge Instructions: Apply 3 multi-layer wrap as prescribed. Wound #9 - Lower Leg Wound Laterality: Left, Lateral Prim Dressing: Hydrofera Blue Ready Transfer Foam, 4x5 (in/in) 1 x Per Week/30 Days ary Discharge Instructions: Apply Hydrofera Blue Ready to wound bed as directed Secondary Dressing: Zetuvit Plus 4x8 (in/in) 1 x Per Week/30 Days Secured With: [employer] Sterile or Non-Sterile 6-ply 4.5x4 (yd/yd) 1 x Per Week/30 Days Discharge Instructions: Apply Kerlix as directed Compression Wrap: 3-LAYER WRAP - Profore Lite LF 3 Multilayer Compression Bandaging System 1 x Per Week/30 Days [name], [name] ([id number]) [id number] Page 2 of 2 Discharge Instructions: Apply 3 multi-layer wrap as prescribed. Electronic Signature(s) Signed: 08/29/2022 4:52:27 PM By: [name] MSN RN CNS WTA Signed: 08/29/2022 4:53:17 PM By: Kalman Shan DO Previous Signature: 08/29/2022 4:46:01 PM Version By: [name] MSN RN CNS WTA Entered By: [name] on 08/29/2022 16:48:51 -------------------------------------------------------------------------------- [place] Details Patient Name: Date of Service: [name], [name] 08/29/2022 Medical Record Number: [id number] Patient Account Number: [id number] Date of Birth/Sex: Treating RN: 1945/12/09 (76 y.o. [name] Primary Care Provider: [name] Other Clinician: Referring Provider: Treating Provider/Extender: Jeffie Pollock in Treatment: 3 Diagnosis Coding ICD-10 Codes Code Description [phone] Chronic venous hypertension (idiopathic) with ulcer and inflammation of bilateral lower extremity L97.822 Non-pressure chronic  ulcer of other part of left lower leg with fat layer exposed L97.812 Non-pressure chronic ulcer of other part of right lower leg with fat layer exposed I73.89 Other specified peripheral vascular diseases I48.0 Paroxysmal atrial fibrillation I10 Essential (primary) hypertension Facility Procedures : CPT4: Code 99833825  29 fo Description: 779 BILATERAL: Application of multi-layer venous compression system; leg (below knee), including ankle and ot. Modifier: Quantity: 1 Electronic Signature(s) Signed: 08/29/2022 5:51:39 PM By: Gretta Cool, BSN, RN, CWS, Kim RN, BSN Previous Signature: 08/29/2022 4:49:25 PM Version By: [name] MSN RN CNS WTA Previous Signature: 08/29/2022 4:53:17 PM Version By: Kalman Shan DO Previous Signature: 08/29/2022 4:47:43 PM Version By: [name] MSN RN CNS WTA Entered By: Gretta Cool, BSN, RN, CWS, Kim on 08/29/2022 17:51:39

## 2022-08-30 NOTE — Progress Notes (Signed)
[name], [name] ([id number]) [id number] Page 1 of 9 Visit Report for 08/29/2022 Arrival Information Details Patient Name: Date of Service: MO Rhetta [name] 08/29/2022 4:00 PM Medical Record Number: [id number] Patient Account Number: [id number] Date of Birth/Sex: Treating RN: 1946/03/25 (76 y.o. [name] Primary Care [name]: [name] Other Clinician: Referring [name]: Treating [name]/Extender: [name] in Treatment: 3 Visit Information History Since Last Visit Added or deleted any medications: No Patient Arrived: Ambulatory Any new allergies or adverse reactions: No Arrival Time: 16:43 Had [name] fall or experienced change in No Accompanied By: sister activities of daily living that may affect Transfer Assistance: None risk of falls: Patient Identification Verified: Yes Hospitalized since last visit: No Secondary Verification Process Completed: Yes Pain Present Now: No Patient Requires Transmission-Based No Precautions: Patient Has Alerts: Yes Patient Alerts: Patient on Blood Thinner 12/13/21 TBI [name]; NOT diabetic HISTORY OF SKIN CANCERS Electronic Signature(s) Signed: 08/29/2022 4:44:13 PM By: [name] Loud MSN RN CNS WTA Entered By: [name] on 08/29/2022 16:44:13 -------------------------------------------------------------------------------- Clinic Level of Care Assessment Details Patient Name: Date of Service: [name] 08/29/2022 4:00 PM Medical Record Number: [id number] Patient Account Number: [id number] Date of Birth/Sex: Treating RN: 02-25-1946 (76 y.o. [name] Primary Care Ricquel Foulk: [name] Other Clinician: Referring Deontre Allsup: Treating Hameed Kolar/Extender: [name] in Treatment: 3 Clinic Level of Care Assessment Items TOOL 4 Quantity Score X- 1 0 Use when only an EandM is performed on FOLLOW-UP visit ASSESSMENTS -  Nursing Assessment / Reassessment '[]'$  - 0 Reassessment of Co-morbidities (includes updates in patient status) '[]'$  - 0 Reassessment of Adherence to Treatment Plan [name] ([id number]) [id number] Page 2 of 9 ASSESSMENTS - Wound and Skin [name] ssessment / Reassessment '[]'$  - 0 Simple Wound Assessment / Reassessment - one wound '[]'$  - 0 Complex Wound Assessment / Reassessment - multiple wounds '[]'$  - 0 Dermatologic / Skin Assessment (not related to wound area) ASSESSMENTS - Focused Assessment '[]'$  - 0 Circumferential Edema Measurements - multi extremities '[]'$  - 0 Nutritional Assessment / Counseling / Intervention '[]'$  - 0 Lower Extremity Assessment (monofilament, tuning fork, pulses) '[]'$  - 0 Peripheral Arterial Disease Assessment (using hand held doppler) ASSESSMENTS - Ostomy and/or Continence Assessment and Care '[]'$  - 0 Incontinence Assessment and Management '[]'$  - 0 Ostomy Care Assessment and Management (repouching, etc.) PROCESS - Coordination of Care '[]'$  - 0 Simple Patient / Family Education for ongoing care '[]'$  - 0 Complex (extensive) Patient / Family Education for ongoing care X- 1 10 Staff obtains Programmer, systems, Records, T Results / Process Orders est '[]'$  - 0 Staff telephones HHA, Nursing Homes / Clarify orders / etc '[]'$  - 0 Routine Transfer to another Facility (non-emergent condition) '[]'$  - 0 Routine Hospital Admission (non-emergent condition) '[]'$  - 0 New Admissions / Biomedical engineer / Ordering NPWT Apligraf, etc. , '[]'$  - 0 Emergency Hospital Admission (emergent condition) X- 1 10 Simple Discharge Coordination '[]'$  - 0 Complex (extensive) Discharge Coordination PROCESS - Special Needs '[]'$  - 0 Pediatric / Minor Patient Management '[]'$  - 0 Isolation Patient Management '[]'$  - 0 Hearing / Language / Visual special needs '[]'$  - 0 Assessment of Community assistance (transportation, [name], etc.) '[]'$  - 0 Additional assistance / Altered mentation '[]'$  -  0 Support Surface(s) Assessment (bed, cushion, seat, etc.) INTERVENTIONS - Wound Cleansing / Measurement '[]'$  - 0 Simple Wound Cleansing - one wound X- 4 5 Complex Wound Cleansing - multiple wounds '[]'$  -  Wound Assessment: Wound #9 Left,Lateral Lower Leg Performed By: Clinician [name] Loud, RN Compression Type: Three Hydrologist) Signed: 08/29/2022 4:48:31 PM By: [name] Loud MSN RN CNS WTA Entered By: [name] on 08/29/2022 16:48:31 [name], [name] ([id number]) [id number] Page 5 of 9 -------------------------------------------------------------------------------- Encounter Discharge Information Details Patient Name: Date of Service: [name] 08/29/2022 4:00 PM Medical Record Number: [id number] Patient Account Number: [id number] Date of Birth/Sex: Treating RN: 1946-08-31 (76 y.o. [name] Primary Care Kennie Snedden: [name] Other Clinician: Referring Mattox Schorr: Treating Jamieon Lannen/Extender: [name] in Treatment: 3 Encounter Discharge Information Items Discharge Condition: Stable Ambulatory Status: Ambulatory Discharge Destination: Home Transportation: Private Auto Accompanied By: sister Schedule Follow-up Appointment: Yes Clinical Summary of Care: Electronic Signature(s) Signed: 08/29/2022 4:46:32 PM By: [name] Loud MSN RN CNS WTA Entered By: [name] on 08/29/2022  16:46:32 -------------------------------------------------------------------------------- Wound Assessment Details Patient Name: Date of Service: [name], [name] 08/29/2022 4:00 PM Medical Record Number: [id number] Patient Account Number: [id number] Date of Birth/Sex: Treating RN: 08-11-1946 (76 y.o. [name] Primary Care Masiah Woody: [name] Other Clinician: Referring Ferron Ishmael: Treating Zaide Kardell/Extender: [name] in Treatment: 3 Wound Status Wound Number: 10 Primary Etiology: Venous Leg Ulcer Wound Location: Left, Medial Lower Leg Wound Status: Open Wounding Event: Gradually Appeared Comorbid History: Arrhythmia, Hypertension, Gout Date Acquired: 01/20/2022 Weeks Of Treatment: 3 Clustered Wound: No Wound Measurements Length: (cm) 1 Width: (cm) 1 Depth: (cm) 0.1 Area: (cm) 0.785 Volume: (cm) 0.079 % Reduction in Area: 48.8% % Reduction in Volume: 74.2% Wound Description Classification: Full Thickness Without Exposed Support Structures Wise ([id number]) Exudate Amount: Medium Exudate Type: Serous Exudate Color: amber [id number] Page 6 of 9 Wound Bed Exposed Structure Fascia Exposed: No Fat Layer (Subcutaneous Tissue) Exposed: Yes Tendon Exposed: No Muscle Exposed: No Joint Exposed: No Bone Exposed: No Treatment Notes Wound #10 (Lower Leg) Wound Laterality: Left, Medial Cleanser Soap and Water Discharge Instruction: Gently cleanse wound with antibacterial soap, rinse and pat dry prior to dressing wounds [place] Discharge Instruction: Suggestions: Theraderm, Eucerin, Cetaphil, or patient preference. Topical Primary Dressing Silvercel Small 2x2 (in/in) Discharge Instruction: Apply Silvercel Small 2x2 (in/in) as instructed Secondary Dressing ABD Pad 5x9 (in/in) Discharge Instruction: Cover with ABD pad Secured With Compression Wrap 3-LAYER WRAP -  Profore Lite LF 3 Multilayer Compression Bandaging System Discharge Instruction: Apply 3 multi-layer wrap as prescribed. Compression Stockings Add-Ons Electronic Signature(s) Signed: 08/29/2022 4:45:03 PM By: [name] Loud MSN RN CNS WTA Entered By: [name] on 08/29/2022 16:45:03 -------------------------------------------------------------------------------- Wound Assessment Details Patient Name: Date of Service: [name], [name] 08/29/2022 4:00 PM Medical Record Number: [id number] Patient Account Number: [id number] Date of Birth/Sex: Treating RN: 29-Nov-1945 (76 y.o. [name] Primary Care Adriano Bischof: [name] Other Clinician: Referring Genise Strack: Treating Melroy Bougher/Extender: [name] in Treatment: 3 Wound Status Wound Number: 13 Primary Etiology: Venous Leg Ulcer Wound Location: Right, Midline Lower Leg Wound Status: Open [name], [name] ([id number]) [id number] Page 7 of 9 Wounding Event: Gradually Appeared Comorbid History: Arrhythmia, Hypertension, Gout Date Acquired: 01/22/2022 Weeks Of Treatment: 3 Clustered Wound: No Wound Measurements Length: (cm) 0.6 Width: (cm) 0.5 Depth: (cm) 0.1 Area: (cm) 0.236 Volume: (cm) 0.024 % Reduction in Area: 62.4% % Reduction in Volume: 61.9% Wound Description Classification: Full Thickness Without Exposed Support Exudate Amount: Large Exudate Type: Serous Exudate Color: amber Structures Treatment Notes Wound #13 (Lower Leg) Wound Laterality: Right, Midline  Cleanser Peri-Wound Care Topical Primary Dressing Secondary Dressing Secured With Compression Wrap Compression Stockings Add-Ons Electronic Signature(s) Signed: 08/29/2022 4:45:08 PM By: [name] Loud MSN RN CNS WTA Entered By: [name] on 08/29/2022 16:45:08 -------------------------------------------------------------------------------- Wound Assessment Details Patient Name: Date of  Service: [name], [name] 08/29/2022 4:00 PM Medical Record Number: [id number] Patient Account Number: [id number] Date of Birth/Sex: Treating RN: 12-01-1945 (76 y.o. [name] Primary Care Amnah Breuer: [name] Other Clinician: Referring Salvatore Shear: Treating Teela Narducci/Extender: [name] in Treatment: 3 Wound Status Wound Number: 14 Primary Etiology: Venous Leg Ulcer Wound Location: Right, Lateral Lower Leg Wound Status: Open Wounding Event: Gradually Appeared Comorbid History: Arrhythmia, Hypertension, Gout Date Acquired: 01/20/2022 Weeks Of Treatment: 3 Clustered Wound: No Wound Measurements Length: (cm) 0.4 [name], [name] ([id number]) Width: (cm) 0.4 Depth: (cm) 0.2 Area: (cm) 0.1 Volume: (cm) 0.0 % Reduction in Area: 75% [id number] Page 8 of 9 % Reduction in Volume: 75.2% Epithelialization: Small (1-33%) 26 25 Wound Description Classification: Full Thickness Without Exposed Suppor Exudate Amount: Medium Exudate Type: Serous Exudate Color: amber t Structures Foul Odor After Cleansing: No Slough/Fibrino No Wound Bed Granulation Amount: Medium (34-66%) Exposed Structure Granulation Quality: Red Fascia Exposed: No Necrotic Amount: Medium (34-66%) Fat Layer (Subcutaneous Tissue) Exposed: Yes Necrotic Quality: Adherent Slough Tendon Exposed: No Muscle Exposed: No Joint Exposed: No Bone Exposed: No Treatment Notes Wound #14 (Lower Leg) Wound Laterality: Right, Lateral Cleanser Peri-Wound Care Topical Primary Dressing Secondary Dressing Secured With Compression Wrap Compression Stockings Add-Ons Electronic Signature(s) Signed: 08/29/2022 4:45:13 PM By: [name] Loud MSN RN CNS WTA Entered By: [name] on 08/29/2022 16:45:13 -------------------------------------------------------------------------------- Wound Assessment Details Patient Name: Date of Service: [place], [name]  08/29/2022 4:00 PM Medical Record Number: [id number] Patient Account Number: [id number] Date of Birth/Sex: Treating RN: 07/30/1946 (76 y.o. [name] Primary Care Amilah Greenspan: [name] Other Clinician: Referring Alyss Granato: Treating Taylan Mayhan/Extender: [name] in Treatment: 3 Wound Status Wound Number: 9 Primary Etiology: Malignant Wound Wound Location: Left, Lateral Lower Leg Wound Status: Open Wounding Event: Gradually Appeared Comorbid History: Arrhythmia, Hypertension, Gout Date Acquired: 01/20/2022 Weeks Of Treatment: 3 Clustered Wound: No [name], [name] ([id number]) [id number] Page 9 of 9 Wound Measurements Length: (cm) 5 Width: (cm) 3.4 Depth: (cm) 0.1 Area: (cm) 13.352 Volume: (cm) 1.335 % Reduction in Area: 24% % Reduction in Volume: 62% Epithelialization: Medium (34-66%) Wound Description Classification: Full Thickness Without Exposed Suppor Wound Margin: Thickened Exudate Amount: Large Exudate Type: Serosanguineous Exudate Color: red, brown t Structures Foul Odor After Cleansing: No Slough/Fibrino Yes Wound Bed Granulation Amount: Medium (34-66%) Exposed Structure Granulation Quality: Red, Hyper-granulation Fascia Exposed: No Necrotic Amount: Medium (34-66%) Fat Layer (Subcutaneous Tissue) Exposed: Yes Necrotic Quality: Adherent Slough Tendon Exposed: No Muscle Exposed: No Joint Exposed: No Bone Exposed: No Treatment Notes Wound #9 (Lower Leg) Wound Laterality: Left, Lateral Cleanser Peri-Wound Care Topical Primary Dressing Hydrofera Blue Ready Transfer Foam, 4x5 (in/in) Discharge Instruction: Apply Hydrofera Blue Ready to wound bed as directed Secondary Dressing Zetuvit Plus 4x8 (in/in) Secured With [employer] or Non-Sterile 6-ply 4.5x4 (yd/yd) Discharge Instruction: Apply Kerlix as directed Compression Wrap 3-LAYER WRAP - Profore Lite LF 3 Multilayer Compression Bandaging  System Discharge Instruction: Apply 3 multi-layer wrap as prescribed. Compression Stockings Add-Ons Electronic Signature(s) Signed: 08/29/2022 4:45:19 PM By: [name] Loud MSN RN CNS WTA Entered By: [name] on 08/29/2022 16:45:18  Wound Assessment: Wound #9 Left,Lateral Lower Leg Performed By: Clinician [name] Loud, RN Compression Type: Three Hydrologist) Signed: 08/29/2022 4:48:31 PM By: [name] Loud MSN RN CNS WTA Entered By: [name] on 08/29/2022 16:48:31 [name], [name] ([id number]) [id number] Page 5 of 9 -------------------------------------------------------------------------------- Encounter Discharge Information Details Patient Name: Date of Service: [name] 08/29/2022 4:00 PM Medical Record Number: [id number] Patient Account Number: [id number] Date of Birth/Sex: Treating RN: 1946-08-31 (76 y.o. [name] Primary Care Kennie Snedden: [name] Other Clinician: Referring [place] Schorr: Treating Jamieon Lannen/Extender: [name] in Treatment: 3 Encounter Discharge Information Items Discharge Condition: Stable Ambulatory Status: Ambulatory Discharge Destination: Home Transportation: Private Auto Accompanied By: sister Schedule Follow-up Appointment: Yes Clinical Summary of Care: Electronic Signature(s) Signed: 08/29/2022 4:46:32 PM By: [name] Loud MSN RN CNS WTA Entered By: [name] on 08/29/2022  16:46:32 -------------------------------------------------------------------------------- Wound Assessment Details Patient Name: Date of Service: [name], [name] 08/29/2022 4:00 PM Medical Record Number: [id number] Patient Account Number: [id number] Date of Birth/Sex: Treating RN: 08-11-1946 (76 y.o. [name] Primary Care Masiah Woody: [name] Other Clinician: Referring Ferron Ishmael: Treating Zaide Kardell/Extender: [name] in Treatment: 3 Wound Status Wound Number: 10 Primary Etiology: Venous Leg Ulcer Wound Location: Left, Medial Lower Leg Wound Status: Open Wounding Event: Gradually Appeared Comorbid History: Arrhythmia, Hypertension, Gout Date Acquired: 01/20/2022 Weeks Of Treatment: 3 Clustered Wound: No Wound Measurements Length: (cm) 1 Width: (cm) 1 Depth: (cm) 0.1 Area: (cm) 0.785 Volume: (cm) 0.079 % Reduction in Area: 48.8% % Reduction in Volume: 74.2% Wound Description Classification: Full Thickness Without Exposed Support Structures Wise ([id number]) Exudate Amount: Medium Exudate Type: Serous Exudate Color: amber [id number] Page 6 of 9 Wound Bed Exposed Structure Fascia Exposed: No Fat Layer (Subcutaneous Tissue) Exposed: Yes Tendon Exposed: No Muscle Exposed: No Joint Exposed: No Bone Exposed: No Treatment Notes Wound #10 (Lower Leg) Wound Laterality: Left, Medial Cleanser Soap and Water Discharge Instruction: Gently cleanse wound with antibacterial soap, rinse and pat dry prior to dressing wounds [place] Discharge Instruction: Suggestions: Theraderm, Eucerin, Cetaphil, or patient preference. Topical Primary Dressing Silvercel Small 2x2 (in/in) Discharge Instruction: Apply Silvercel Small 2x2 (in/in) as instructed Secondary Dressing ABD Pad 5x9 (in/in) Discharge Instruction: Cover with ABD pad Secured With Compression Wrap 3-LAYER WRAP -  Profore Lite LF 3 Multilayer Compression Bandaging System Discharge Instruction: Apply 3 multi-layer wrap as prescribed. Compression Stockings Add-Ons Electronic Signature(s) Signed: 08/29/2022 4:45:03 PM By: [name] Loud MSN RN CNS WTA Entered By: [name] on 08/29/2022 16:45:03 -------------------------------------------------------------------------------- Wound Assessment Details Patient Name: Date of Service: [name], [name] 08/29/2022 4:00 PM Medical Record Number: [id number] Patient Account Number: [id number] Date of Birth/Sex: Treating RN: 29-Nov-1945 (76 y.o. [name] Primary Care Adriano Bischof: [name] Other Clinician: Referring Genise Strack: Treating Melroy Bougher/Extender: [name] in Treatment: 3 Wound Status Wound Number: 13 Primary Etiology: Venous Leg Ulcer Wound Location: Right, Midline Lower Leg Wound Status: Open [name], [name] ([id number]) [id number] Page 7 of 9 Wounding Event: Gradually Appeared Comorbid History: Arrhythmia, Hypertension, Gout Date Acquired: 01/22/2022 Weeks Of Treatment: 3 Clustered Wound: No Wound Measurements Length: (cm) 0.6 Width: (cm) 0.5 Depth: (cm) 0.1 Area: (cm) 0.236 Volume: (cm) 0.024 % Reduction in Area: 62.4% % Reduction in Volume: 61.9% Wound Description Classification: Full Thickness Without Exposed Support Exudate Amount: Large Exudate Type: Serous Exudate Color: amber Structures Treatment Notes Wound #13 (Lower Leg) Wound Laterality: Right, Midline

## 2022-09-04 ENCOUNTER — Encounter: Payer: Medicare PPO | Admitting: Physician Assistant

## 2022-09-04 DIAGNOSIS — L97812 Non-pressure chronic ulcer of other part of right lower leg with fat layer exposed: Secondary | ICD-10-CM | POA: Diagnosis not present

## 2022-09-04 DIAGNOSIS — I7389 Other specified peripheral vascular diseases: Secondary | ICD-10-CM | POA: Diagnosis not present

## 2022-09-04 DIAGNOSIS — I10 Essential (primary) hypertension: Secondary | ICD-10-CM | POA: Diagnosis not present

## 2022-09-04 DIAGNOSIS — L97122 Non-pressure chronic ulcer of left thigh with fat layer exposed: Secondary | ICD-10-CM | POA: Diagnosis not present

## 2022-09-04 DIAGNOSIS — I480 Paroxysmal atrial fibrillation: Secondary | ICD-10-CM | POA: Diagnosis not present

## 2022-09-04 DIAGNOSIS — L97822 Non-pressure chronic ulcer of other part of left lower leg with fat layer exposed: Secondary | ICD-10-CM | POA: Diagnosis not present

## 2022-09-04 DIAGNOSIS — L97112 Non-pressure chronic ulcer of right thigh with fat layer exposed: Secondary | ICD-10-CM | POA: Diagnosis not present

## 2022-09-04 DIAGNOSIS — I87333 Chronic venous hypertension (idiopathic) with ulcer and inflammation of bilateral lower extremity: Secondary | ICD-10-CM | POA: Diagnosis not present

## 2022-09-04 DIAGNOSIS — Z85828 Personal history of other malignant neoplasm of skin: Secondary | ICD-10-CM | POA: Diagnosis not present

## 2022-09-04 NOTE — Progress Notes (Addendum)
Blue Ready Transfer Foam, 4x5 (in/in) 1 x Per Week/30 Days ary Discharge Instructions: Apply Hydrofera Blue Ready to wound bed as directed Secondary Dressing: Zetuvit Plus 4x8 (in/in) 1 x Per Week/30 Days Secured With: [employer] Sterile or Non-Sterile 6-ply 4.5x4 (yd/yd) 1 x Per Week/30 Days Discharge Instructions: Apply Kerlix as directed Compression Wrap: 3-LAYER WRAP - Profore Lite LF 3 Multilayer Compression Bandaging System 1 x Per Week/30 Days CORMICK, MOSS ([id number]) [id number] Page 5 of 10 Discharge Instructions: Apply 3 multi-layer wrap as prescribed. Patient Medications llergies: No Known Allergies A Notifications Medication Indication Start End 09/04/2022 Bactrim DS DOSE 1 - oral 800 mg-160 mg tablet - 1 tablet oral twice a day x 14 days Electronic Signature(s) Signed: 09/04/2022 1:04:13 PM By: [name] PA-C Entered By: [name] on 09/04/2022 13:04:13 -------------------------------------------------------------------------------- Problem List Details Patient Name: Date of Service: [place], [name]  09/04/2022 10:00 A M Medical Record Number: [id number] Patient Account Number: [id number] Date of Birth/Sex: Treating RN: 12/15/1945 (76 y.o. [name] Primary Care Provider: [name] Other Clinician: Referring Provider: Treating Provider/Extender: [name] in Treatment: 4 Active Problems ICD-10 Encounter Code Description Active Date MDM Diagnosis [phone] Chronic venous hypertension (idiopathic) with ulcer and inflammation of 08/02/2022 No Yes bilateral lower extremity L97.822 Non-pressure chronic ulcer of other part of left lower leg with fat layer 08/02/2022 No Yes exposed L97.812 Non-pressure chronic ulcer of other part of right lower leg with fat layer 08/02/2022 No Yes exposed I73.89 Other specified peripheral vascular diseases 08/02/2022 No Yes I48.0 Paroxysmal atrial fibrillation 08/02/2022 No Yes I10 Essential (primary) hypertension 08/02/2022 No Yes Inactive Problems HOSTEEN, KIENAST ([id number]) [id number] Page 6 of 10 Resolved Problems Electronic Signature(s) Signed: 09/04/2022 3:52:05 PM By: [name] MSN RN CNS WTA Signed: 09/04/2022 4:53:03 PM By: [name] PA-C Previous Signature: 09/04/2022 10:29:10 AM Version By: [name] PA-C Entered By: [name] on 09/04/2022 11:29:55 -------------------------------------------------------------------------------- Progress Note Details Patient Name: Date of Service: [place], [name] 09/04/2022 10:00 A M Medical Record Number: [id number] Patient Account Number: [id number] Date of Birth/Sex: Treating RN: 1946/01/17 (76 y.o. 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 the patient has had a vascular workup over 7 years ago and said everything was normal at that stage. He does not have any chronic problems except for cardiac issues which he sees a cardiologist in [place]. 08/15/2017 -- arterial and venous duplex studies still pending. 08/23/2017 -- venous reflux studies done on 08/13/2017 shows venous incompetence throughout the left lower extremity deep system and focally at the left saphenofemoral junction. 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 No evidence of SVT or DVT in bilateral lower extremities The patient has an appointment at the end of the month to get his arterial duplex study done 09/05/2017 -- the patient was seen at the vein and vascular office yesterday by Angelena Form. ABI studies were notable for medial calcification and the toe brachial indices were normal and bilateral  Blue Ready Transfer Foam, 4x5 (in/in) 1 x Per Week/30 Days ary Discharge Instructions: Apply Hydrofera Blue Ready to wound bed as directed Secondary Dressing: Zetuvit Plus 4x8 (in/in) 1 x Per Week/30 Days Secured With: [employer] Sterile or Non-Sterile 6-ply 4.5x4 (yd/yd) 1 x Per Week/30 Days Discharge Instructions: Apply Kerlix as directed Compression Wrap: 3-LAYER WRAP - Profore Lite LF 3 Multilayer Compression Bandaging System 1 x Per Week/30 Days CORMICK, MOSS ([id number]) [id number] Page 5 of 10 Discharge Instructions: Apply 3 multi-layer wrap as prescribed. Patient Medications llergies: No Known Allergies A Notifications Medication Indication Start End 09/04/2022 Bactrim DS DOSE 1 - oral 800 mg-160 mg tablet - 1 tablet oral twice a day x 14 days Electronic Signature(s) Signed: 09/04/2022 1:04:13 PM By: [name] PA-C Entered By: [name] on 09/04/2022 13:04:13 -------------------------------------------------------------------------------- Problem List Details Patient Name: Date of Service: [place], [name]  09/04/2022 10:00 A M Medical Record Number: [id number] Patient Account Number: [id number] Date of Birth/Sex: Treating RN: 12/15/1945 (76 y.o. [name] Primary Care Provider: [name] Other Clinician: Referring Provider: Treating Provider/Extender: [name] in Treatment: 4 Active Problems ICD-10 Encounter Code Description Active Date MDM Diagnosis [phone] Chronic venous hypertension (idiopathic) with ulcer and inflammation of 08/02/2022 No Yes bilateral lower extremity L97.822 Non-pressure chronic ulcer of other part of left lower leg with fat layer 08/02/2022 No Yes exposed L97.812 Non-pressure chronic ulcer of other part of right lower leg with fat layer 08/02/2022 No Yes exposed I73.89 Other specified peripheral vascular diseases 08/02/2022 No Yes I48.0 Paroxysmal atrial fibrillation 08/02/2022 No Yes I10 Essential (primary) hypertension 08/02/2022 No Yes Inactive Problems HOSTEEN, KIENAST ([id number]) [id number] Page 6 of 10 Resolved Problems Electronic Signature(s) Signed: 09/04/2022 3:52:05 PM By: [name] MSN RN CNS WTA Signed: 09/04/2022 4:53:03 PM By: [name] PA-C Previous Signature: 09/04/2022 10:29:10 AM Version By: [name] PA-C Entered By: [name] on 09/04/2022 11:29:55 -------------------------------------------------------------------------------- Progress Note Details Patient Name: Date of Service: [place], [name] 09/04/2022 10:00 A M Medical Record Number: [id number] Patient Account Number: [id number] Date of Birth/Sex: Treating RN: 1946/01/17 (76 y.o. 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 the patient has had a vascular workup over 7 years ago and said everything was normal at that stage. He does not have any chronic problems except for cardiac issues which he sees a cardiologist in [place]. 08/15/2017 -- arterial and venous duplex studies still pending. 08/23/2017 -- venous reflux studies done on 08/13/2017 shows venous incompetence throughout the left lower extremity deep system and focally at the left saphenofemoral junction. 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 No evidence of SVT or DVT in bilateral lower extremities The patient has an appointment at the end of the month to get his arterial duplex study done 09/05/2017 -- the patient was seen at the vein and vascular office yesterday by Angelena Form. ABI studies were notable for medial calcification and the toe brachial indices were normal and bilateral  CALEL, PISARSKI R ([id number]) [id number] Page 1 of 10 Visit Report for 09/04/2022 Chief Complaint Document Details Patient Name: Date of Service: MO Wandra Scot R. 09/04/2022 10:00 A M Medical Record Number: [id number] Patient Account Number: [id number] Date of Birth/Sex: Treating RN: 08-Mar-1946 (76 y.o. [name] Primary Care Provider: [name] Other Clinician: Referring Provider: Treating Provider/Extender: [name] in Treatment: 4 Information Obtained from: Patient Chief Complaint Bilateral LE Ulcers Electronic Signature(s) Signed: 09/04/2022 10:29:16 AM By: [name] PA-C Entered By: [name] on 09/04/2022 10:29:16 -------------------------------------------------------------------------------- HPI Details Patient Name: Date of Service: [place], [name] 09/04/2022 10:00 A M Medical Record Number: [id number] Patient Account Number: [id number] Date of Birth/Sex: Treating RN: Dec 30, 1945 (76 y.o. [name] Primary Care Provider: [name] Other Clinician: Referring Provider: Treating Provider/Extender: [name] in Treatment: 4 History of Present Illness HPI Description: 76 year old male who has a past medical history of essential hypertension, chronic atrial fibrillation, peripheral vascular disease, nonischemic cardiomyopathy,venous stasis dermatitis, gouty arthropathy, basal cell carcinoma of the right lower extremity, benign prostatic hypertrophy, long- term use of anticoagulation therapy, hyperglycemia and exercise intolerance has never been a smoker. the patient has had a vascular workup over 7 years ago and said everything was normal at that stage. He does not have any chronic problems except for cardiac issues which he sees a cardiologist in [place]. 08/15/2017 -- arterial and venous duplex studies still pending. 08/23/2017 -- venous reflux studies done on  08/13/2017 shows venous incompetence throughout the left lower extremity deep system and focally at the left saphenofemoral junction. 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 No evidence of SVT or DVT in bilateral lower extremities The patient has an appointment at the end of the month to get his arterial duplex study done 09/05/2017 -- the patient was seen at the vein and vascular office yesterday by Angelena Form. ABI studies were notable for medial calcification and the toe brachial indices were normal and bilateral ankle-brachial) waveforms were normal with triphasic flow. After review of his venous studies he was not a candidate for laser ablation and his lymphedema was to be treated with compression stockings and lymphedema pump pumps 09/12/2017 -- had a low arterial study done at the [facility] -- unable to obtain reliable ABI is due to medial calcification. Bilateral toe FISCHER, HALLEY ([id number]) [id number] Page 2 of 10 indices were normal with the right being 1.01 and the left being 0.92 and the waveforms were triphasic bilaterally. he did get hold of 30-40 mm compression stockings but is unable to put these on. We will try and get him alternative compression stockings. 09/26/17- he is here in follow up evaluation of a right lower extremity ulcer;he is compliant in wearing compression stocking; ulcer almost epithelialized , anticipate healing next appointment Readmission: 11/17 point upon evaluation patient's wound currently that he is seeing [place] for today is a skin cancerous lesion that was cleared away by his dermatologist on the left medial calf region. He tells me that this is a very similar thing to what he had done previously in fact the last time he saw him in 2018 this was also what was going on at that point. Nonetheless he feels that based on what he seeing currently that this is just having a lot of harder  time healing although it is much closer to the surface than what he is experienced in  CALEL, PISARSKI R ([id number]) [id number] Page 1 of 10 Visit Report for 09/04/2022 Chief Complaint Document Details Patient Name: Date of Service: MO Wandra Scot R. 09/04/2022 10:00 A M Medical Record Number: [id number] Patient Account Number: [id number] Date of Birth/Sex: Treating RN: 08-Mar-1946 (76 y.o. [name] Primary Care Provider: [name] Other Clinician: Referring Provider: Treating Provider/Extender: [name] in Treatment: 4 Information Obtained from: Patient Chief Complaint Bilateral LE Ulcers Electronic Signature(s) Signed: 09/04/2022 10:29:16 AM By: [name] PA-C Entered By: [name] on 09/04/2022 10:29:16 -------------------------------------------------------------------------------- HPI Details Patient Name: Date of Service: [place], [name] 09/04/2022 10:00 A M Medical Record Number: [id number] Patient Account Number: [id number] Date of Birth/Sex: Treating RN: Dec 30, 1945 (76 y.o. [name] Primary Care Provider: [name] Other Clinician: Referring Provider: Treating Provider/Extender: [name] in Treatment: 4 History of Present Illness HPI Description: 76 year old male who has a past medical history of essential hypertension, chronic atrial fibrillation, peripheral vascular disease, nonischemic cardiomyopathy,venous stasis dermatitis, gouty arthropathy, basal cell carcinoma of the right lower extremity, benign prostatic hypertrophy, long- term use of anticoagulation therapy, hyperglycemia and exercise intolerance has never been a smoker. the patient has had a vascular workup over 7 years ago and said everything was normal at that stage. He does not have any chronic problems except for cardiac issues which he sees a cardiologist in [place]. 08/15/2017 -- arterial and venous duplex studies still pending. 08/23/2017 -- venous reflux studies done on  08/13/2017 shows venous incompetence throughout the left lower extremity deep system and focally at the left saphenofemoral junction. 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 No evidence of SVT or DVT in bilateral lower extremities The patient has an appointment at the end of the month to get his arterial duplex study done 09/05/2017 -- the patient was seen at the vein and vascular office yesterday by Angelena Form. ABI studies were notable for medial calcification and the toe brachial indices were normal and bilateral ankle-brachial) waveforms were normal with triphasic flow. After review of his venous studies he was not a candidate for laser ablation and his lymphedema was to be treated with compression stockings and lymphedema pump pumps 09/12/2017 -- had a low arterial study done at the [facility] -- unable to obtain reliable ABI is due to medial calcification. Bilateral toe FISCHER, HALLEY ([id number]) [id number] Page 2 of 10 indices were normal with the right being 1.01 and the left being 0.92 and the waveforms were triphasic bilaterally. he did get hold of 30-40 mm compression stockings but is unable to put these on. We will try and get him alternative compression stockings. 09/26/17- he is here in follow up evaluation of a right lower extremity ulcer;he is compliant in wearing compression stocking; ulcer almost epithelialized , anticipate healing next appointment Readmission: 11/17 point upon evaluation patient's wound currently that he is seeing [place] for today is a skin cancerous lesion that was cleared away by his dermatologist on the left medial calf region. He tells me that this is a very similar thing to what he had done previously in fact the last time he saw him in 2018 this was also what was going on at that point. Nonetheless he feels that based on what he seeing currently that this is just having a lot of harder  time healing although it is much closer to the surface than what he is experienced in  Blue Ready Transfer Foam, 4x5 (in/in) 1 x Per Week/30 Days ary Discharge Instructions: Apply Hydrofera Blue Ready to wound bed as directed Secondary Dressing: Zetuvit Plus 4x8 (in/in) 1 x Per Week/30 Days Secured With: [employer] Sterile or Non-Sterile 6-ply 4.5x4 (yd/yd) 1 x Per Week/30 Days Discharge Instructions: Apply Kerlix as directed Compression Wrap: 3-LAYER WRAP - Profore Lite LF 3 Multilayer Compression Bandaging System 1 x Per Week/30 Days CORMICK, MOSS ([id number]) [id number] Page 5 of 10 Discharge Instructions: Apply 3 multi-layer wrap as prescribed. Patient Medications llergies: No Known Allergies A Notifications Medication Indication Start End 09/04/2022 Bactrim DS DOSE 1 - oral 800 mg-160 mg tablet - 1 tablet oral twice a day x 14 days Electronic Signature(s) Signed: 09/04/2022 1:04:13 PM By: [name] PA-C Entered By: [name] on 09/04/2022 13:04:13 -------------------------------------------------------------------------------- Problem List Details Patient Name: Date of Service: [place], [name]  09/04/2022 10:00 A M Medical Record Number: [id number] Patient Account Number: [id number] Date of Birth/Sex: Treating RN: 12/15/1945 (76 y.o. [name] Primary Care Provider: [name] Other Clinician: Referring Provider: Treating Provider/Extender: [name] in Treatment: 4 Active Problems ICD-10 Encounter Code Description Active Date MDM Diagnosis [phone] Chronic venous hypertension (idiopathic) with ulcer and inflammation of 08/02/2022 No Yes bilateral lower extremity L97.822 Non-pressure chronic ulcer of other part of left lower leg with fat layer 08/02/2022 No Yes exposed L97.812 Non-pressure chronic ulcer of other part of right lower leg with fat layer 08/02/2022 No Yes exposed I73.89 Other specified peripheral vascular diseases 08/02/2022 No Yes I48.0 Paroxysmal atrial fibrillation 08/02/2022 No Yes I10 Essential (primary) hypertension 08/02/2022 No Yes Inactive Problems HOSTEEN, KIENAST ([id number]) [id number] Page 6 of 10 Resolved Problems Electronic Signature(s) Signed: 09/04/2022 3:52:05 PM By: [name] MSN RN CNS WTA Signed: 09/04/2022 4:53:03 PM By: [name] PA-C Previous Signature: 09/04/2022 10:29:10 AM Version By: [name] PA-C Entered By: [name] on 09/04/2022 11:29:55 -------------------------------------------------------------------------------- Progress Note Details Patient Name: Date of Service: [place], [name] 09/04/2022 10:00 A M Medical Record Number: [id number] Patient Account Number: [id number] Date of Birth/Sex: Treating RN: 1946/01/17 (76 y.o. 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 the patient has had a vascular workup over 7 years ago and said everything was normal at that stage. He does not have any chronic problems except for cardiac issues which he sees a cardiologist in [place]. 08/15/2017 -- arterial and venous duplex studies still pending. 08/23/2017 -- venous reflux studies done on 08/13/2017 shows venous incompetence throughout the left lower extremity deep system and focally at the left saphenofemoral junction. 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 No evidence of SVT or DVT in bilateral lower extremities The patient has an appointment at the end of the month to get his arterial duplex study done 09/05/2017 -- the patient was seen at the vein and vascular office yesterday by Angelena Form. ABI studies were notable for medial calcification and the toe brachial indices were normal and bilateral  Blue Ready Transfer Foam, 4x5 (in/in) 1 x Per Week/30 Days ary Discharge Instructions: Apply Hydrofera Blue Ready to wound bed as directed Secondary Dressing: Zetuvit Plus 4x8 (in/in) 1 x Per Week/30 Days Secured With: [employer] Sterile or Non-Sterile 6-ply 4.5x4 (yd/yd) 1 x Per Week/30 Days Discharge Instructions: Apply Kerlix as directed Compression Wrap: 3-LAYER WRAP - Profore Lite LF 3 Multilayer Compression Bandaging System 1 x Per Week/30 Days CORMICK, MOSS ([id number]) [id number] Page 5 of 10 Discharge Instructions: Apply 3 multi-layer wrap as prescribed. Patient Medications llergies: No Known Allergies A Notifications Medication Indication Start End 09/04/2022 Bactrim DS DOSE 1 - oral 800 mg-160 mg tablet - 1 tablet oral twice a day x 14 days Electronic Signature(s) Signed: 09/04/2022 1:04:13 PM By: [name] PA-C Entered By: [name] on 09/04/2022 13:04:13 -------------------------------------------------------------------------------- Problem List Details Patient Name: Date of Service: [place], [name]  09/04/2022 10:00 A M Medical Record Number: [id number] Patient Account Number: [id number] Date of Birth/Sex: Treating RN: 12/15/1945 (76 y.o. [name] Primary Care Provider: [name] Other Clinician: Referring Provider: Treating Provider/Extender: [name] in Treatment: 4 Active Problems ICD-10 Encounter Code Description Active Date MDM Diagnosis [phone] Chronic venous hypertension (idiopathic) with ulcer and inflammation of 08/02/2022 No Yes bilateral lower extremity L97.822 Non-pressure chronic ulcer of other part of left lower leg with fat layer 08/02/2022 No Yes exposed L97.812 Non-pressure chronic ulcer of other part of right lower leg with fat layer 08/02/2022 No Yes exposed I73.89 Other specified peripheral vascular diseases 08/02/2022 No Yes I48.0 Paroxysmal atrial fibrillation 08/02/2022 No Yes I10 Essential (primary) hypertension 08/02/2022 No Yes Inactive Problems HOSTEEN, KIENAST ([id number]) [id number] Page 6 of 10 Resolved Problems Electronic Signature(s) Signed: 09/04/2022 3:52:05 PM By: [name] MSN RN CNS WTA Signed: 09/04/2022 4:53:03 PM By: [name] PA-C Previous Signature: 09/04/2022 10:29:10 AM Version By: [name] PA-C Entered By: [name] on 09/04/2022 11:29:55 -------------------------------------------------------------------------------- Progress Note Details Patient Name: Date of Service: [place], [name] 09/04/2022 10:00 A M Medical Record Number: [id number] Patient Account Number: [id number] Date of Birth/Sex: Treating RN: 1946/01/17 (76 y.o. 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 the patient has had a vascular workup over 7 years ago and said everything was normal at that stage. He does not have any chronic problems except for cardiac issues which he sees a cardiologist in [place]. 08/15/2017 -- arterial and venous duplex studies still pending. 08/23/2017 -- venous reflux studies done on 08/13/2017 shows venous incompetence throughout the left lower extremity deep system and focally at the left saphenofemoral junction. 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 No evidence of SVT or DVT in bilateral lower extremities The patient has an appointment at the end of the month to get his arterial duplex study done 09/05/2017 -- the patient was seen at the vein and vascular office yesterday by Angelena Form. ABI studies were notable for medial calcification and the toe brachial indices were normal and bilateral  Blue Ready Transfer Foam, 4x5 (in/in) 1 x Per Week/30 Days ary Discharge Instructions: Apply Hydrofera Blue Ready to wound bed as directed Secondary Dressing: Zetuvit Plus 4x8 (in/in) 1 x Per Week/30 Days Secured With: [employer] Sterile or Non-Sterile 6-ply 4.5x4 (yd/yd) 1 x Per Week/30 Days Discharge Instructions: Apply Kerlix as directed Compression Wrap: 3-LAYER WRAP - Profore Lite LF 3 Multilayer Compression Bandaging System 1 x Per Week/30 Days CORMICK, MOSS ([id number]) [id number] Page 5 of 10 Discharge Instructions: Apply 3 multi-layer wrap as prescribed. Patient Medications llergies: No Known Allergies A Notifications Medication Indication Start End 09/04/2022 Bactrim DS DOSE 1 - oral 800 mg-160 mg tablet - 1 tablet oral twice a day x 14 days Electronic Signature(s) Signed: 09/04/2022 1:04:13 PM By: [name] PA-C Entered By: [name] on 09/04/2022 13:04:13 -------------------------------------------------------------------------------- Problem List Details Patient Name: Date of Service: [place], [name]  09/04/2022 10:00 A M Medical Record Number: [id number] Patient Account Number: [id number] Date of Birth/Sex: Treating RN: 12/15/1945 (76 y.o. [name] Primary Care Provider: [name] Other Clinician: Referring Provider: Treating Provider/Extender: [name] in Treatment: 4 Active Problems ICD-10 Encounter Code Description Active Date MDM Diagnosis [phone] Chronic venous hypertension (idiopathic) with ulcer and inflammation of 08/02/2022 No Yes bilateral lower extremity L97.822 Non-pressure chronic ulcer of other part of left lower leg with fat layer 08/02/2022 No Yes exposed L97.812 Non-pressure chronic ulcer of other part of right lower leg with fat layer 08/02/2022 No Yes exposed I73.89 Other specified peripheral vascular diseases 08/02/2022 No Yes I48.0 Paroxysmal atrial fibrillation 08/02/2022 No Yes I10 Essential (primary) hypertension 08/02/2022 No Yes Inactive Problems HOSTEEN, KIENAST ([id number]) [id number] Page 6 of 10 Resolved Problems Electronic Signature(s) Signed: 09/04/2022 3:52:05 PM By: [name] MSN RN CNS WTA Signed: 09/04/2022 4:53:03 PM By: [name] PA-C Previous Signature: 09/04/2022 10:29:10 AM Version By: [name] PA-C Entered By: [name] on 09/04/2022 11:29:55 -------------------------------------------------------------------------------- Progress Note Details Patient Name: Date of Service: [place], [name] 09/04/2022 10:00 A M Medical Record Number: [id number] Patient Account Number: [id number] Date of Birth/Sex: Treating RN: 1946/01/17 (76 y.o. 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 the patient has had a vascular workup over 7 years ago and said everything was normal at that stage. He does not have any chronic problems except for cardiac issues which he sees a cardiologist in [place]. 08/15/2017 -- arterial and venous duplex studies still pending. 08/23/2017 -- venous reflux studies done on 08/13/2017 shows venous incompetence throughout the left lower extremity deep system and focally at the left saphenofemoral junction. 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 No evidence of SVT or DVT in bilateral lower extremities The patient has an appointment at the end of the month to get his arterial duplex study done 09/05/2017 -- the patient was seen at the vein and vascular office yesterday by Angelena Form. ABI studies were notable for medial calcification and the toe brachial indices were normal and bilateral  CALEL, PISARSKI R ([id number]) [id number] Page 1 of 10 Visit Report for 09/04/2022 Chief Complaint Document Details Patient Name: Date of Service: MO Wandra Scot R. 09/04/2022 10:00 A M Medical Record Number: [id number] Patient Account Number: [id number] Date of Birth/Sex: Treating RN: 08-Mar-1946 (76 y.o. [name] Primary Care Provider: [name] Other Clinician: Referring Provider: Treating Provider/Extender: [name] in Treatment: 4 Information Obtained from: Patient Chief Complaint Bilateral LE Ulcers Electronic Signature(s) Signed: 09/04/2022 10:29:16 AM By: [name] PA-C Entered By: [name] on 09/04/2022 10:29:16 -------------------------------------------------------------------------------- HPI Details Patient Name: Date of Service: [place], [name] 09/04/2022 10:00 A M Medical Record Number: [id number] Patient Account Number: [id number] Date of Birth/Sex: Treating RN: Dec 30, 1945 (76 y.o. [name] Primary Care Provider: [name] Other Clinician: Referring Provider: Treating Provider/Extender: [name] in Treatment: 4 History of Present Illness HPI Description: 76 year old male who has a past medical history of essential hypertension, chronic atrial fibrillation, peripheral vascular disease, nonischemic cardiomyopathy,venous stasis dermatitis, gouty arthropathy, basal cell carcinoma of the right lower extremity, benign prostatic hypertrophy, long- term use of anticoagulation therapy, hyperglycemia and exercise intolerance has never been a smoker. the patient has had a vascular workup over 7 years ago and said everything was normal at that stage. He does not have any chronic problems except for cardiac issues which he sees a cardiologist in [place]. 08/15/2017 -- arterial and venous duplex studies still pending. 08/23/2017 -- venous reflux studies done on  08/13/2017 shows venous incompetence throughout the left lower extremity deep system and focally at the left saphenofemoral junction. 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 No evidence of SVT or DVT in bilateral lower extremities The patient has an appointment at the end of the month to get his arterial duplex study done 09/05/2017 -- the patient was seen at the vein and vascular office yesterday by Angelena Form. ABI studies were notable for medial calcification and the toe brachial indices were normal and bilateral ankle-brachial) waveforms were normal with triphasic flow. After review of his venous studies he was not a candidate for laser ablation and his lymphedema was to be treated with compression stockings and lymphedema pump pumps 09/12/2017 -- had a low arterial study done at the [facility] -- unable to obtain reliable ABI is due to medial calcification. Bilateral toe FISCHER, HALLEY ([id number]) [id number] Page 2 of 10 indices were normal with the right being 1.01 and the left being 0.92 and the waveforms were triphasic bilaterally. he did get hold of 30-40 mm compression stockings but is unable to put these on. We will try and get him alternative compression stockings. 09/26/17- he is here in follow up evaluation of a right lower extremity ulcer;he is compliant in wearing compression stocking; ulcer almost epithelialized , anticipate healing next appointment Readmission: 11/17 point upon evaluation patient's wound currently that he is seeing [place] for today is a skin cancerous lesion that was cleared away by his dermatologist on the left medial calf region. He tells me that this is a very similar thing to what he had done previously in fact the last time he saw him in 2018 this was also what was going on at that point. Nonetheless he feels that based on what he seeing currently that this is just having a lot of harder  time healing although it is much closer to the surface than what he is experienced in  CALEL, PISARSKI R ([id number]) [id number] Page 1 of 10 Visit Report for 09/04/2022 Chief Complaint Document Details Patient Name: Date of Service: MO Wandra Scot R. 09/04/2022 10:00 A M Medical Record Number: [id number] Patient Account Number: [id number] Date of Birth/Sex: Treating RN: 08-Mar-1946 (76 y.o. [name] Primary Care Provider: [name] Other Clinician: Referring Provider: Treating Provider/Extender: [name] in Treatment: 4 Information Obtained from: Patient Chief Complaint Bilateral LE Ulcers Electronic Signature(s) Signed: 09/04/2022 10:29:16 AM By: [name] PA-C Entered By: [name] on 09/04/2022 10:29:16 -------------------------------------------------------------------------------- HPI Details Patient Name: Date of Service: [place], [name] 09/04/2022 10:00 A M Medical Record Number: [id number] Patient Account Number: [id number] Date of Birth/Sex: Treating RN: Dec 30, 1945 (76 y.o. [name] Primary Care Provider: [name] Other Clinician: Referring Provider: Treating Provider/Extender: [name] in Treatment: 4 History of Present Illness HPI Description: 76 year old male who has a past medical history of essential hypertension, chronic atrial fibrillation, peripheral vascular disease, nonischemic cardiomyopathy,venous stasis dermatitis, gouty arthropathy, basal cell carcinoma of the right lower extremity, benign prostatic hypertrophy, long- term use of anticoagulation therapy, hyperglycemia and exercise intolerance has never been a smoker. the patient has had a vascular workup over 7 years ago and said everything was normal at that stage. He does not have any chronic problems except for cardiac issues which he sees a cardiologist in [place]. 08/15/2017 -- arterial and venous duplex studies still pending. 08/23/2017 -- venous reflux studies done on  08/13/2017 shows venous incompetence throughout the left lower extremity deep system and focally at the left saphenofemoral junction. 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 No evidence of SVT or DVT in bilateral lower extremities The patient has an appointment at the end of the month to get his arterial duplex study done 09/05/2017 -- the patient was seen at the vein and vascular office yesterday by Angelena Form. ABI studies were notable for medial calcification and the toe brachial indices were normal and bilateral ankle-brachial) waveforms were normal with triphasic flow. After review of his venous studies he was not a candidate for laser ablation and his lymphedema was to be treated with compression stockings and lymphedema pump pumps 09/12/2017 -- had a low arterial study done at the [facility] -- unable to obtain reliable ABI is due to medial calcification. Bilateral toe FISCHER, HALLEY ([id number]) [id number] Page 2 of 10 indices were normal with the right being 1.01 and the left being 0.92 and the waveforms were triphasic bilaterally. he did get hold of 30-40 mm compression stockings but is unable to put these on. We will try and get him alternative compression stockings. 09/26/17- he is here in follow up evaluation of a right lower extremity ulcer;he is compliant in wearing compression stocking; ulcer almost epithelialized , anticipate healing next appointment Readmission: 11/17 point upon evaluation patient's wound currently that he is seeing [place] for today is a skin cancerous lesion that was cleared away by his dermatologist on the left medial calf region. He tells me that this is a very similar thing to what he had done previously in fact the last time he saw him in 2018 this was also what was going on at that point. Nonetheless he feels that based on what he seeing currently that this is just having a lot of harder  time healing although it is much closer to the surface than what he is experienced in  Blue Ready Transfer Foam, 4x5 (in/in) 1 x Per Week/30 Days ary Discharge Instructions: Apply Hydrofera Blue Ready to wound bed as directed Secondary Dressing: Zetuvit Plus 4x8 (in/in) 1 x Per Week/30 Days Secured With: [employer] Sterile or Non-Sterile 6-ply 4.5x4 (yd/yd) 1 x Per Week/30 Days Discharge Instructions: Apply Kerlix as directed Compression Wrap: 3-LAYER WRAP - Profore Lite LF 3 Multilayer Compression Bandaging System 1 x Per Week/30 Days CORMICK, MOSS ([id number]) [id number] Page 5 of 10 Discharge Instructions: Apply 3 multi-layer wrap as prescribed. Patient Medications llergies: No Known Allergies A Notifications Medication Indication Start End 09/04/2022 Bactrim DS DOSE 1 - oral 800 mg-160 mg tablet - 1 tablet oral twice a day x 14 days Electronic Signature(s) Signed: 09/04/2022 1:04:13 PM By: [name] PA-C Entered By: [name] on 09/04/2022 13:04:13 -------------------------------------------------------------------------------- Problem List Details Patient Name: Date of Service: [place], [name]  09/04/2022 10:00 A M Medical Record Number: [id number] Patient Account Number: [id number] Date of Birth/Sex: Treating RN: 12/15/1945 (76 y.o. [name] Primary Care Provider: [name] Other Clinician: Referring Provider: Treating Provider/Extender: [name] in Treatment: 4 Active Problems ICD-10 Encounter Code Description Active Date MDM Diagnosis [phone] Chronic venous hypertension (idiopathic) with ulcer and inflammation of 08/02/2022 No Yes bilateral lower extremity L97.822 Non-pressure chronic ulcer of other part of left lower leg with fat layer 08/02/2022 No Yes exposed L97.812 Non-pressure chronic ulcer of other part of right lower leg with fat layer 08/02/2022 No Yes exposed I73.89 Other specified peripheral vascular diseases 08/02/2022 No Yes I48.0 Paroxysmal atrial fibrillation 08/02/2022 No Yes I10 Essential (primary) hypertension 08/02/2022 No Yes Inactive Problems HOSTEEN, KIENAST ([id number]) [id number] Page 6 of 10 Resolved Problems Electronic Signature(s) Signed: 09/04/2022 3:52:05 PM By: [name] MSN RN CNS WTA Signed: 09/04/2022 4:53:03 PM By: [name] PA-C Previous Signature: 09/04/2022 10:29:10 AM Version By: [name] PA-C Entered By: [name] on 09/04/2022 11:29:55 -------------------------------------------------------------------------------- Progress Note Details Patient Name: Date of Service: [place], [name] 09/04/2022 10:00 A M Medical Record Number: [id number] Patient Account Number: [id number] Date of Birth/Sex: Treating RN: 1946/01/17 (76 y.o. 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 the patient has had a vascular workup over 7 years ago and said everything was normal at that stage. He does not have any chronic problems except for cardiac issues which he sees a cardiologist in [place]. 08/15/2017 -- arterial and venous duplex studies still pending. 08/23/2017 -- venous reflux studies done on 08/13/2017 shows venous incompetence throughout the left lower extremity deep system and focally at the left saphenofemoral junction. 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 No evidence of SVT or DVT in bilateral lower extremities The patient has an appointment at the end of the month to get his arterial duplex study done 09/05/2017 -- the patient was seen at the vein and vascular office yesterday by Angelena Form. ABI studies were notable for medial calcification and the toe brachial indices were normal and bilateral  Blue Ready Transfer Foam, 4x5 (in/in) 1 x Per Week/30 Days ary Discharge Instructions: Apply Hydrofera Blue Ready to wound bed as directed Secondary Dressing: Zetuvit Plus 4x8 (in/in) 1 x Per Week/30 Days Secured With: [employer] Sterile or Non-Sterile 6-ply 4.5x4 (yd/yd) 1 x Per Week/30 Days Discharge Instructions: Apply Kerlix as directed Compression Wrap: 3-LAYER WRAP - Profore Lite LF 3 Multilayer Compression Bandaging System 1 x Per Week/30 Days CORMICK, MOSS ([id number]) [id number] Page 5 of 10 Discharge Instructions: Apply 3 multi-layer wrap as prescribed. Patient Medications llergies: No Known Allergies A Notifications Medication Indication Start End 09/04/2022 Bactrim DS DOSE 1 - oral 800 mg-160 mg tablet - 1 tablet oral twice a day x 14 days Electronic Signature(s) Signed: 09/04/2022 1:04:13 PM By: [name] PA-C Entered By: [name] on 09/04/2022 13:04:13 -------------------------------------------------------------------------------- Problem List Details Patient Name: Date of Service: [place], [name]  09/04/2022 10:00 A M Medical Record Number: [id number] Patient Account Number: [id number] Date of Birth/Sex: Treating RN: 12/15/1945 (76 y.o. [name] Primary Care Provider: [name] Other Clinician: Referring Provider: Treating Provider/Extender: [name] in Treatment: 4 Active Problems ICD-10 Encounter Code Description Active Date MDM Diagnosis [phone] Chronic venous hypertension (idiopathic) with ulcer and inflammation of 08/02/2022 No Yes bilateral lower extremity L97.822 Non-pressure chronic ulcer of other part of left lower leg with fat layer 08/02/2022 No Yes exposed L97.812 Non-pressure chronic ulcer of other part of right lower leg with fat layer 08/02/2022 No Yes exposed I73.89 Other specified peripheral vascular diseases 08/02/2022 No Yes I48.0 Paroxysmal atrial fibrillation 08/02/2022 No Yes I10 Essential (primary) hypertension 08/02/2022 No Yes Inactive Problems HOSTEEN, KIENAST ([id number]) [id number] Page 6 of 10 Resolved Problems Electronic Signature(s) Signed: 09/04/2022 3:52:05 PM By: [name] MSN RN CNS WTA Signed: 09/04/2022 4:53:03 PM By: [name] PA-C Previous Signature: 09/04/2022 10:29:10 AM Version By: [name] PA-C Entered By: [name] on 09/04/2022 11:29:55 -------------------------------------------------------------------------------- Progress Note Details Patient Name: Date of Service: [place], [name] 09/04/2022 10:00 A M Medical Record Number: [id number] Patient Account Number: [id number] Date of Birth/Sex: Treating RN: 1946/01/17 (76 y.o. 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 the patient has had a vascular workup over 7 years ago and said everything was normal at that stage. He does not have any chronic problems except for cardiac issues which he sees a cardiologist in [place]. 08/15/2017 -- arterial and venous duplex studies still pending. 08/23/2017 -- venous reflux studies done on 08/13/2017 shows venous incompetence throughout the left lower extremity deep system and focally at the left saphenofemoral junction. 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 No evidence of SVT or DVT in bilateral lower extremities The patient has an appointment at the end of the month to get his arterial duplex study done 09/05/2017 -- the patient was seen at the vein and vascular office yesterday by Angelena Form. ABI studies were notable for medial calcification and the toe brachial indices were normal and bilateral  Blue Ready Transfer Foam, 4x5 (in/in) 1 x Per Week/30 Days ary Discharge Instructions: Apply Hydrofera Blue Ready to wound bed as directed Secondary Dressing: Zetuvit Plus 4x8 (in/in) 1 x Per Week/30 Days Secured With: [employer] Sterile or Non-Sterile 6-ply 4.5x4 (yd/yd) 1 x Per Week/30 Days Discharge Instructions: Apply Kerlix as directed Compression Wrap: 3-LAYER WRAP - Profore Lite LF 3 Multilayer Compression Bandaging System 1 x Per Week/30 Days CORMICK, MOSS ([id number]) [id number] Page 5 of 10 Discharge Instructions: Apply 3 multi-layer wrap as prescribed. Patient Medications llergies: No Known Allergies A Notifications Medication Indication Start End 09/04/2022 Bactrim DS DOSE 1 - oral 800 mg-160 mg tablet - 1 tablet oral twice a day x 14 days Electronic Signature(s) Signed: 09/04/2022 1:04:13 PM By: [name] PA-C Entered By: [name] on 09/04/2022 13:04:13 -------------------------------------------------------------------------------- Problem List Details Patient Name: Date of Service: [place], [name]  09/04/2022 10:00 A M Medical Record Number: [id number] Patient Account Number: [id number] Date of Birth/Sex: Treating RN: 12/15/1945 (76 y.o. [name] Primary Care Provider: [name] Other Clinician: Referring Provider: Treating Provider/Extender: [name] in Treatment: 4 Active Problems ICD-10 Encounter Code Description Active Date MDM Diagnosis [phone] Chronic venous hypertension (idiopathic) with ulcer and inflammation of 08/02/2022 No Yes bilateral lower extremity L97.822 Non-pressure chronic ulcer of other part of left lower leg with fat layer 08/02/2022 No Yes exposed L97.812 Non-pressure chronic ulcer of other part of right lower leg with fat layer 08/02/2022 No Yes exposed I73.89 Other specified peripheral vascular diseases 08/02/2022 No Yes I48.0 Paroxysmal atrial fibrillation 08/02/2022 No Yes I10 Essential (primary) hypertension 08/02/2022 No Yes Inactive Problems HOSTEEN, KIENAST ([id number]) [id number] Page 6 of 10 Resolved Problems Electronic Signature(s) Signed: 09/04/2022 3:52:05 PM By: [name] MSN RN CNS WTA Signed: 09/04/2022 4:53:03 PM By: [name] PA-C Previous Signature: 09/04/2022 10:29:10 AM Version By: [name] PA-C Entered By: [name] on 09/04/2022 11:29:55 -------------------------------------------------------------------------------- Progress Note Details Patient Name: Date of Service: [place], [name] 09/04/2022 10:00 A M Medical Record Number: [id number] Patient Account Number: [id number] Date of Birth/Sex: Treating RN: 1946/01/17 (76 y.o. 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 the patient has had a vascular workup over 7 years ago and said everything was normal at that stage. He does not have any chronic problems except for cardiac issues which he sees a cardiologist in [place]. 08/15/2017 -- arterial and venous duplex studies still pending. 08/23/2017 -- venous reflux studies done on 08/13/2017 shows venous incompetence throughout the left lower extremity deep system and focally at the left saphenofemoral junction. 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 No evidence of SVT or DVT in bilateral lower extremities The patient has an appointment at the end of the month to get his arterial duplex study done 09/05/2017 -- the patient was seen at the vein and vascular office yesterday by Angelena Form. ABI studies were notable for medial calcification and the toe brachial indices were normal and bilateral

## 2022-09-04 NOTE — Progress Notes (Signed)
[id number]) [id number] Page 9 of  17 -------------------------------------------------------------------------------- Patient/Caregiver Education Details Patient Name: Date of Service: [name] 11/28/2023andnbsp10:00 [name] Medical Record Number: [id number] Patient Account Number: [id number] Date of Birth/Gender: Treating RN: 1946/09/29 (76 y.o. [name] Primary Care Physician: [name] Other Clinician: Referring Physician: Treating Physician/Extender: [name] in Treatment: 4 Education Assessment Education Provided To: Patient Education Topics Provided Wound/Skin Impairment: Handouts: Caring for Your Ulcer Methods: Explain/Verbal Responses: State content correctly Electronic Signature(s) Signed: 09/04/2022 3:52:05 PM By: [name] Loud MSN RN CNS WTA Entered By: [name] on 09/04/2022 11:29:50 -------------------------------------------------------------------------------- Wound Assessment Details Patient Name: Date of Service: [name], [name] 09/04/2022 10:00 [name] Medical Record Number: [id number] Patient Account Number: [id number] Date of Birth/Sex: Treating RN: 1946-02-18 (76 y.o. [name] Primary Care Cobi Delph: [name] Other Clinician: Referring Chantille Navarrete: Treating Yaritzi Craun/Extender: [name] in Treatment: 4 Wound Status Wound Number: 10 Primary Etiology: Venous Leg Ulcer Wound Location: Left, Medial Lower Leg Wound Status: Healed - Epithelialized Wounding Event: Gradually Appeared Comorbid History: Arrhythmia, Hypertension, Gout Date Acquired: 01/20/2022 Weeks Of Treatment: 4 Clustered Wound: No Photos [name], [name] ([id number]) [id number] Page 10 of 17 Wound Measurements Length: (cm) Width: (cm) Depth: (cm) Area: (cm) Volume: (cm) 0 % Reduction in Area: 100% 0 % Reduction in Volume: 100% 0 0 0 Wound Description Classification: Full Thickness Without Exposed  Support Exudate Amount: Medium Exudate Type: Serous Exudate Color: amber Structures Wound Bed Exposed Structure Fascia Exposed: No Fat Layer (Subcutaneous Tissue) Exposed: Yes Tendon Exposed: No Muscle Exposed: No Joint Exposed: No Bone Exposed: No Treatment Notes Wound #10 (Lower Leg) Wound Laterality: Left, Medial Cleanser Peri-Wound Care Topical Primary Dressing Secondary Dressing Secured With Compression Wrap Compression Stockings Add-Ons Electronic Signature(s) Signed: 09/04/2022 3:52:05 PM By: [name] Loud MSN RN CNS WTA Entered By: [name] on 09/04/2022 11:26:50 Wound Assessment Details -------------------------------------------------------------------------------- [name] ([id number]) [id number] Page 11 of 17 Patient Name: Date of Service: [name] 09/04/2022 10:00 [name] Medical Record Number: [id number] Patient Account Number: [id number] Date of Birth/Sex: Treating RN: 01-09-1946 (76 y.o. [name] Primary Care Joakim Huesman: [name] Other Clinician: Referring Caidence Kaseman: Treating Raeshaun Simson/Extender: [name] in Treatment: 4 Wound Status Wound Number: 13 Primary Etiology: Venous Leg Ulcer Wound Location: Right, Midline Lower Leg Wound Status: Healed - Epithelialized Wounding Event: Gradually Appeared Comorbid History: Arrhythmia, Hypertension, Gout Date Acquired: 01/22/2022 Weeks Of Treatment: 4 Clustered Wound: No Photos Wound Measurements Length: (cm) 0 % Reduction in Area: 100% Width: (cm) 0 % Reduction in Volume: 100% Depth: (cm) 0 Area: (cm) 0 Volume: (cm) 0 Wound Description Classification: Full Thickness Without Exposed Support Structures Exudate Amount: Large Exudate Type: Serous Exudate Color: amber Wound Bed Granulation Amount: Medium (34-66%) Exposed Structure Granulation Quality: Red Fascia Exposed: No Necrotic Amount: None Present (0%) Fat Layer  (Subcutaneous Tissue) Exposed: Yes Tendon Exposed: No Muscle Exposed: No Joint Exposed: No Bone Exposed: No Treatment Notes Wound #13 (Lower Leg) Wound Laterality: Right, Midline Cleanser Peri-Wound Care Topical Primary Dressing Secondary Dressing Secured With Compression Wrap Compression Stockings Add-Ons Electronic Signature(s) Signed: 09/04/2022 3:52:05 PM By: [name] Loud MSN RN CNS WTA Entered By: [name] on 09/04/2022 11:26:50 [name] ([id number]) [id number] Page 12 of 17 -------------------------------------------------------------------------------- Wound Assessment Details Patient Name: Date of Service: [name] 09/04/2022 10:00 P[place] Record Number: [id number] Patient Account Number: [id number] Date  Of Treatment: 0 Clustered Wound: No Photos [name], [name] ([id number]) [id number] Page 15 of 17 Wound Measurements Length: (cm) 0.4 Width: (cm) 0.4 Depth: (cm) 0.1 Area: (cm) 0.126 Volume: (cm) 0.013 % Reduction in Area: % Reduction in Volume: Epithelialization: None Wound Description Classification: Unclassifiable Exudate Amount: Medium Exudate Type: Sanguinous Exudate Color: red Foul  Odor After Cleansing: No Slough/Fibrino No Wound Bed Granulation Amount: None Present (0%) Exposed Structure Necrotic Amount: None Present (0%) Fascia Exposed: No Fat Layer (Subcutaneous Tissue) Exposed: Yes Tendon Exposed: No Muscle Exposed: No Joint Exposed: No Bone Exposed: No Treatment Notes Wound #16 (Upper Leg) Wound Laterality: Right, Medial, Anterior Cleanser Peri-Wound Care Topical Primary Dressing Hydrofera Blue Ready Transfer Foam, 4x5 (in/in) Discharge Instruction: Apply Hydrofera Blue Ready to wound bed as directed Secondary Dressing Zetuvit Plus 4x8 (in/in) Secured With [employer] or Non-Sterile 6-ply 4.5x4 (yd/yd) Discharge Instruction: Apply Kerlix as directed Compression Wrap 3-LAYER WRAP - Profore Lite LF 3 Multilayer Compression Bandaging System Discharge Instruction: Apply 3 multi-layer wrap as prescribed. Compression Stockings Add-Ons Electronic Signature(s) Signed: 09/04/2022 3:52:05 PM By: [name] Loud MSN RN CNS WTA Entered By: [name] on 09/04/2022 10:30:31 [name], [name] ([id number]) [id number] Page 16 of 17 -------------------------------------------------------------------------------- Wound Assessment Details Patient Name: Date of Service: [name] 09/04/2022 10:00 [name] Medical Record Number: [id number] Patient Account Number: [id number] Date of Birth/Sex: Treating RN: July 20, 1946 (76 y.o. [name] Primary Care Ursula Dermody: [name] Other Clinician: Referring Quan Cybulski: Treating Cincere Deprey/Extender: [name] in Treatment: 4 Wound Status Wound Number: 9 Primary Etiology: Malignant Wound Wound Location: Left, Lateral Lower Leg Wound Status: Open Wounding Event: Gradually Appeared Comorbid History: Arrhythmia, Hypertension, Gout Date Acquired: 01/20/2022 Weeks Of Treatment: 4 Clustered Wound: No Photos Wound Measurements Length: (cm) 3.8 Width: (cm)  3 Depth: (cm) 0.1 Area: (cm) 8.954 Volume: (cm) 0.895 % Reduction in Area: 49% % Reduction in Volume: 74.5% Epithelialization: Medium (34-66%) Wound Description Classification: Full Thickness Without Exposed Suppor Wound Margin: Thickened Exudate Amount: Large Exudate Type: Serosanguineous Exudate Color: red, brown t Structures Foul Odor After Cleansing: No Slough/Fibrino Yes Wound Bed Granulation Amount: Medium (34-66%) Exposed Structure Granulation Quality: Red, Hyper-granulation Fascia Exposed: No Necrotic Amount: Medium (34-66%) Fat Layer (Subcutaneous Tissue) Exposed: Yes Necrotic Quality: Adherent Slough Tendon Exposed: No Muscle Exposed: No Joint Exposed: No Bone Exposed: No Treatment Notes Wound #9 (Lower Leg) Wound Laterality: Left, Lateral Cleanser Peri-Wound Care BREK, REECE [name] ([id number]) [id number] Page 17 of 17 Topical Primary Dressing Hydrofera Blue Ready Transfer Foam, 4x5 (in/in) Discharge Instruction: Apply Hydrofera Blue Ready to wound bed as directed Secondary Dressing Zetuvit Plus 4x8 (in/in) Secured With [employer] Sterile or Non-Sterile 6-ply 4.5x4 (yd/yd) Discharge Instruction: Apply Kerlix as directed Compression Wrap 3-LAYER WRAP - Profore Lite LF 3 Multilayer Compression Bandaging System Discharge Instruction: Apply 3 multi-layer wrap as prescribed. Compression Stockings Add-Ons Electronic Signature(s) Signed: 09/04/2022 3:52:05 PM By: [name] Loud MSN RN CNS WTA Entered By: [name] on 09/04/2022 10:32:51 -------------------------------------------------------------------------------- Vitals Details Patient Name: Date of Service: [name], [name] 09/04/2022 10:00 [name] Medical Record Number: [id number] Patient Account Number: [id number] Date of Birth/Sex: Treating RN: September 22, 1946 (76 y.o. [name] Primary Care Shakara Tweedy: [name] Other Clinician: Referring Eimy Plaza: Treating  Yaresly Menzel/Extender: [name] in Treatment: 4 Vital Signs Time Taken: 10:04 Temperature (F): 98.0 Height (in): 75 Pulse (bpm): 72 Weight (lbs): 225 Respiratory Rate (breaths/min): 16 Body Mass Index (BMI): 28.1 Blood Pressure (  [id number]) [id number] Page 9 of  17 -------------------------------------------------------------------------------- Patient/Caregiver Education Details Patient Name: Date of Service: [name] 11/28/2023andnbsp10:00 [name] Medical Record Number: [id number] Patient Account Number: [id number] Date of Birth/Gender: Treating RN: 1946/09/29 (76 y.o. [name] Primary Care Physician: [name] Other Clinician: Referring Physician: Treating Physician/Extender: [name] in Treatment: 4 Education Assessment Education Provided To: Patient Education Topics Provided Wound/Skin Impairment: Handouts: Caring for Your Ulcer Methods: Explain/Verbal Responses: State content correctly Electronic Signature(s) Signed: 09/04/2022 3:52:05 PM By: [name] Loud MSN RN CNS WTA Entered By: [name] on 09/04/2022 11:29:50 -------------------------------------------------------------------------------- Wound Assessment Details Patient Name: Date of Service: [name], [name] 09/04/2022 10:00 [name] Medical Record Number: [id number] Patient Account Number: [id number] Date of Birth/Sex: Treating RN: 1946-02-18 (76 y.o. [name] Primary Care Cobi Delph: [name] Other Clinician: Referring Chantille Navarrete: Treating Yaritzi Craun/Extender: [name] in Treatment: 4 Wound Status Wound Number: 10 Primary Etiology: Venous Leg Ulcer Wound Location: Left, Medial Lower Leg Wound Status: Healed - Epithelialized Wounding Event: Gradually Appeared Comorbid History: Arrhythmia, Hypertension, Gout Date Acquired: 01/20/2022 Weeks Of Treatment: 4 Clustered Wound: No Photos [name], [name] ([id number]) [id number] Page 10 of 17 Wound Measurements Length: (cm) Width: (cm) Depth: (cm) Area: (cm) Volume: (cm) 0 % Reduction in Area: 100% 0 % Reduction in Volume: 100% 0 0 0 Wound Description Classification: Full Thickness Without Exposed  Support Exudate Amount: Medium Exudate Type: Serous Exudate Color: amber Structures Wound Bed Exposed Structure Fascia Exposed: No Fat Layer (Subcutaneous Tissue) Exposed: Yes Tendon Exposed: No Muscle Exposed: No Joint Exposed: No Bone Exposed: No Treatment Notes Wound #10 (Lower Leg) Wound Laterality: Left, Medial Cleanser Peri-Wound Care Topical Primary Dressing Secondary Dressing Secured With Compression Wrap Compression Stockings Add-Ons Electronic Signature(s) Signed: 09/04/2022 3:52:05 PM By: [name] Loud MSN RN CNS WTA Entered By: [name] on 09/04/2022 11:26:50 Wound Assessment Details -------------------------------------------------------------------------------- [name] ([id number]) [id number] Page 11 of 17 Patient Name: Date of Service: [name] 09/04/2022 10:00 [name] Medical Record Number: [id number] Patient Account Number: [id number] Date of Birth/Sex: Treating RN: 01-09-1946 (76 y.o. [name] Primary Care Joakim Huesman: [name] Other Clinician: Referring Caidence Kaseman: Treating Raeshaun Simson/Extender: [name] in Treatment: 4 Wound Status Wound Number: 13 Primary Etiology: Venous Leg Ulcer Wound Location: Right, Midline Lower Leg Wound Status: Healed - Epithelialized Wounding Event: Gradually Appeared Comorbid History: Arrhythmia, Hypertension, Gout Date Acquired: 01/22/2022 Weeks Of Treatment: 4 Clustered Wound: No Photos Wound Measurements Length: (cm) 0 % Reduction in Area: 100% Width: (cm) 0 % Reduction in Volume: 100% Depth: (cm) 0 Area: (cm) 0 Volume: (cm) 0 Wound Description Classification: Full Thickness Without Exposed Support Structures Exudate Amount: Large Exudate Type: Serous Exudate Color: amber Wound Bed Granulation Amount: Medium (34-66%) Exposed Structure Granulation Quality: Red Fascia Exposed: No Necrotic Amount: None Present (0%) Fat Layer  (Subcutaneous Tissue) Exposed: Yes Tendon Exposed: No Muscle Exposed: No Joint Exposed: No Bone Exposed: No Treatment Notes Wound #13 (Lower Leg) Wound Laterality: Right, Midline Cleanser Peri-Wound Care Topical Primary Dressing Secondary Dressing Secured With Compression Wrap Compression Stockings Add-Ons Electronic Signature(s) Signed: 09/04/2022 3:52:05 PM By: [name] Loud MSN RN CNS WTA Entered By: [name] on 09/04/2022 11:26:50 [name] ([id number]) [id number].pdf Page 12 of 17 -------------------------------------------------------------------------------- Wound Assessment Details Patient Name: Date of Service: [name] 09/04/2022 10:00 Perrin Record Number: [id number] Patient Account Number: [id number] Date  of Birth/Sex: Treating RN: 02-Apr-1946 (76 y.o. [name] Primary Care Waynesha Rammel: [name] Other Clinician: Referring Kelsay Haggard: Treating Haile Toppins/Extender: [name] in Treatment: 4 Wound Status Wound Number: 14 Primary Etiology: Venous Leg Ulcer Wound Location: Right, Lateral Lower Leg Wound Status: Healed - Epithelialized Wounding Event: Gradually Appeared Comorbid History: Arrhythmia, Hypertension, Gout Date Acquired: 01/20/2022 Weeks Of Treatment: 4 Clustered Wound: No Photos Wound Measurements Length: (cm) Width: (cm) Depth: (cm) Area: (cm) Volume: (cm) 0 % Reduction in Area: 100% 0 % Reduction in Volume: 100% 0 Epithelialization: Small (1-33%) 0 0 Wound Description Classification: Full Thickness Without Exposed Suppor Exudate Amount: Medium Exudate Type: Serous Exudate Color: amber t Structures Foul Odor After Cleansing: No Slough/Fibrino No Wound Bed Granulation Amount: Medium (34-66%) Exposed Structure Granulation Quality: Red Fascia Exposed: No Necrotic Amount: Medium (34-66%) Fat Layer (Subcutaneous Tissue) Exposed: Yes Tendon Exposed: No Muscle  Exposed: No Joint Exposed: No Bone Exposed: No Treatment Notes Wound #14 (Lower Leg) Wound Laterality: Right, Lateral Cleanser Peri-Wound Care [name], [name] ([id number]) [id number] Page 13 of 17 Topical Primary Dressing Secondary Dressing Secured With Compression Wrap Compression Stockings Add-Ons Electronic Signature(s) Signed: 09/04/2022 3:52:05 PM By: [name] Loud MSN RN CNS WTA Entered By: [name] on 09/04/2022 11:26:50 -------------------------------------------------------------------------------- Wound Assessment Details Patient Name: Date of Service: [name], [name] 09/04/2022 10:00 [name] Medical Record Number: [id number] Patient Account Number: [id number] Date of Birth/Sex: Treating RN: January 17, 1946 (76 y.o. [name] Primary Care Nahzir Pohle: [name] Other Clinician: Referring Amelita Risinger: Treating Miguel Christiana/Extender: [name] in Treatment: 4 Wound Status Wound Number: 15 Primary Etiology: Lesion Wound Location: Left, Posterior Upper Leg Wound Status: Open Wounding Event: Gradually Appeared Comorbid History: Arrhythmia, Hypertension, Gout Date Acquired: 08/28/2022 Weeks Of Treatment: 0 Clustered Wound: No Photos Wound Measurements Length: (cm) 1.8 Width: (cm) 2 Depth: (cm) 0.01 Area: (cm) 2.827 Volume: (cm) 0.028 % Reduction in Area: % Reduction in Volume: Epithelialization: None Tunneling: No Undermining: No Wound Description Classification: Unclassifiable Exudate Amount: Medium Exudate Type: Sanguinous Exudate Color: red [name], [name] ([id number]) Wound Bed Granulation Amount: None Present (0%) Necrotic Amount: None Present (0%) Foul Odor After Cleansing: Yes Due to Product Use: No Slough/Fibrino No [id number].pdf Page 14 of 17 Exposed Structure Fascia Exposed: No Fat Layer (Subcutaneous Tissue) Exposed: Yes Tendon Exposed: No Muscle Exposed:  No Joint Exposed: No Bone Exposed: No Treatment Notes Wound #15 (Upper Leg) Wound Laterality: Left, Posterior Cleanser Peri-Wound Care Topical Primary Dressing Hydrofera Blue Ready Transfer Foam, 4x5 (in/in) Discharge Instruction: Apply Hydrofera Blue Ready to wound bed as directed Secondary Dressing Zetuvit Plus 4x8 (in/in) Secured With [employer] or Non-Sterile 6-ply 4.5x4 (yd/yd) Discharge Instruction: Apply Kerlix as directed Compression Wrap 3-LAYER WRAP - Profore Lite LF 3 Multilayer Compression [place] Discharge Instruction: Apply 3 multi-layer wrap as prescribed. Compression Stockings Add-Ons Electronic Signature(s) Signed: 09/04/2022 3:52:05 PM By: [name] Loud MSN RN CNS WTA Entered By: [name] on 09/04/2022 10:28:40 -------------------------------------------------------------------------------- Wound Assessment Details Patient Name: Date of Service: [name], [name] 09/04/2022 10:00 [name] Medical Record Number: [id number] Patient Account Number: [id number] Date of Birth/Sex: Treating RN: 1945/10/17 (76 y.o. [name] Primary Care Keshan Reha: [name] Other Clinician: Referring Kasyn Stouffer: Treating Justinn Welter/Extender: [name] in Treatment: 4 Wound Status Wound Number: 16 Primary Etiology: Lesion Wound Location: Right, Medial, Anterior Upper Leg Wound Status: Open Wounding Event: Gradually Appeared Comorbid History: Arrhythmia, Hypertension, Gout Date Acquired: 08/28/2022 [name]  [id number]) [id number] Page 9 of  17 -------------------------------------------------------------------------------- Patient/Caregiver Education Details Patient Name: Date of Service: [name] 11/28/2023andnbsp10:00 [name] Medical Record Number: [id number] Patient Account Number: [id number] Date of Birth/Gender: Treating RN: 1946/09/29 (76 y.o. [name] Primary Care Physician: [name] Other Clinician: Referring Physician: Treating Physician/Extender: [name] in Treatment: 4 Education Assessment Education Provided To: Patient Education Topics Provided Wound/Skin Impairment: Handouts: Caring for Your Ulcer Methods: Explain/Verbal Responses: State content correctly Electronic Signature(s) Signed: 09/04/2022 3:52:05 PM By: [name] Loud MSN RN CNS WTA Entered By: [name] on 09/04/2022 11:29:50 -------------------------------------------------------------------------------- Wound Assessment Details Patient Name: Date of Service: [name], [name] 09/04/2022 10:00 [name] Medical Record Number: [id number] Patient Account Number: [id number] Date of Birth/Sex: Treating RN: 1946-02-18 (76 y.o. [name] Primary Care Cobi Delph: [name] Other Clinician: Referring Chantille Navarrete: Treating Yaritzi Craun/Extender: [name] in Treatment: 4 Wound Status Wound Number: 10 Primary Etiology: Venous Leg Ulcer Wound Location: Left, Medial Lower Leg Wound Status: Healed - Epithelialized Wounding Event: Gradually Appeared Comorbid History: Arrhythmia, Hypertension, Gout Date Acquired: 01/20/2022 Weeks Of Treatment: 4 Clustered Wound: No Photos [name], [name] ([id number]) [id number] Page 10 of 17 Wound Measurements Length: (cm) Width: (cm) Depth: (cm) Area: (cm) Volume: (cm) 0 % Reduction in Area: 100% 0 % Reduction in Volume: 100% 0 0 0 Wound Description Classification: Full Thickness Without Exposed  Support Exudate Amount: Medium Exudate Type: Serous Exudate Color: amber Structures Wound Bed Exposed Structure Fascia Exposed: No Fat Layer (Subcutaneous Tissue) Exposed: Yes Tendon Exposed: No Muscle Exposed: No Joint Exposed: No Bone Exposed: No Treatment Notes Wound #10 (Lower Leg) Wound Laterality: Left, Medial Cleanser Peri-Wound Care Topical Primary Dressing Secondary Dressing Secured With Compression Wrap Compression Stockings Add-Ons Electronic Signature(s) Signed: 09/04/2022 3:52:05 PM By: [name] Loud MSN RN CNS WTA Entered By: [name] on 09/04/2022 11:26:50 Wound Assessment Details -------------------------------------------------------------------------------- [name] ([id number]) [id number] Page 11 of 17 Patient Name: Date of Service: [name] 09/04/2022 10:00 [name] Medical Record Number: [id number] Patient Account Number: [id number] Date of Birth/Sex: Treating RN: 01-09-1946 (76 y.o. [name] Primary Care Joakim Huesman: [name] Other Clinician: Referring Caidence Kaseman: Treating Raeshaun Simson/Extender: [name] in Treatment: 4 Wound Status Wound Number: 13 Primary Etiology: Venous Leg Ulcer Wound Location: Right, Midline Lower Leg Wound Status: Healed - Epithelialized Wounding Event: Gradually Appeared Comorbid History: Arrhythmia, Hypertension, Gout Date Acquired: 01/22/2022 Weeks Of Treatment: 4 Clustered Wound: No Photos Wound Measurements Length: (cm) 0 % Reduction in Area: 100% Width: (cm) 0 % Reduction in Volume: 100% Depth: (cm) 0 Area: (cm) 0 Volume: (cm) 0 Wound Description Classification: Full Thickness Without Exposed Support Structures Exudate Amount: Large Exudate Type: Serous Exudate Color: amber Wound Bed Granulation Amount: Medium (34-66%) Exposed Structure Granulation Quality: Red Fascia Exposed: No Necrotic Amount: None Present (0%) Fat Layer  (Subcutaneous Tissue) Exposed: Yes Tendon Exposed: No Muscle Exposed: No Joint Exposed: No Bone Exposed: No Treatment Notes Wound #13 (Lower Leg) Wound Laterality: Right, Midline Cleanser Peri-Wound Care Topical Primary Dressing Secondary Dressing Secured With Compression Wrap Compression Stockings Add-Ons Electronic Signature(s) Signed: 09/04/2022 3:52:05 PM By: [name] Loud MSN RN CNS WTA Entered By: [name] on 09/04/2022 11:26:50 [name] ([id number]) [id number].pdf Page 12 of 17 -------------------------------------------------------------------------------- Wound Assessment Details Patient Name: Date of Service: [name] 09/04/2022 10:00 P[place] Record Number: [id number] Patient Account Number: [id number] Date  of Birth/Sex: Treating RN: 02-Apr-1946 (76 y.o. [name] Primary Care Waynesha Rammel: [name] Other Clinician: Referring Kelsay Haggard: Treating Haile Toppins/Extender: [name] in Treatment: 4 Wound Status Wound Number: 14 Primary Etiology: Venous Leg Ulcer Wound Location: Right, Lateral Lower Leg Wound Status: Healed - Epithelialized Wounding Event: Gradually Appeared Comorbid History: Arrhythmia, Hypertension, Gout Date Acquired: 01/20/2022 Weeks Of Treatment: 4 Clustered Wound: No Photos Wound Measurements Length: (cm) Width: (cm) Depth: (cm) Area: (cm) Volume: (cm) 0 % Reduction in Area: 100% 0 % Reduction in Volume: 100% 0 Epithelialization: Small (1-33%) 0 0 Wound Description Classification: Full Thickness Without Exposed Suppor Exudate Amount: Medium Exudate Type: Serous Exudate Color: amber t Structures Foul Odor After Cleansing: No Slough/Fibrino No Wound Bed Granulation Amount: Medium (34-66%) Exposed Structure Granulation Quality: Red Fascia Exposed: No Necrotic Amount: Medium (34-66%) Fat Layer (Subcutaneous Tissue) Exposed: Yes Tendon Exposed: No Muscle  Exposed: No Joint Exposed: No Bone Exposed: No Treatment Notes Wound #14 (Lower Leg) Wound Laterality: Right, Lateral Cleanser Peri-Wound Care [name], [name] ([id number]) [id number] Page 13 of 17 Topical Primary Dressing Secondary Dressing Secured With Compression Wrap Compression Stockings Add-Ons Electronic Signature(s) Signed: 09/04/2022 3:52:05 PM By: [name] Loud MSN RN CNS WTA Entered By: [name] on 09/04/2022 11:26:50 -------------------------------------------------------------------------------- Wound Assessment Details Patient Name: Date of Service: [name], [name] 09/04/2022 10:00 [name] Medical Record Number: [id number] Patient Account Number: [id number] Date of Birth/Sex: Treating RN: January 17, 1946 (76 y.o. [name] Primary Care Nahzir Pohle: [name] Other Clinician: Referring Amelita Risinger: Treating Miguel Christiana/Extender: [name] in Treatment: 4 Wound Status Wound Number: 15 Primary Etiology: Lesion Wound Location: Left, Posterior Upper Leg Wound Status: Open Wounding Event: Gradually Appeared Comorbid History: Arrhythmia, Hypertension, Gout Date Acquired: 08/28/2022 Weeks Of Treatment: 0 Clustered Wound: No Photos Wound Measurements Length: (cm) 1.8 Width: (cm) 2 Depth: (cm) 0.01 Area: (cm) 2.827 Volume: (cm) 0.028 % Reduction in Area: % Reduction in Volume: Epithelialization: None Tunneling: No Undermining: No Wound Description Classification: Unclassifiable Exudate Amount: Medium Exudate Type: Sanguinous Exudate Color: red [name], [name] ([id number]) Wound Bed Granulation Amount: None Present (0%) Necrotic Amount: None Present (0%) Foul Odor After Cleansing: Yes Due to Product Use: No Slough/Fibrino No [id number] Page 14 of 17 Exposed Structure Fascia Exposed: No Fat Layer (Subcutaneous Tissue) Exposed: Yes Tendon Exposed: No Muscle Exposed:  No Joint Exposed: No Bone Exposed: No Treatment Notes Wound #15 (Upper Leg) Wound Laterality: Left, Posterior Cleanser Peri-Wound Care Topical Primary Dressing Hydrofera Blue Ready Transfer Foam, 4x5 (in/in) Discharge Instruction: Apply Hydrofera Blue Ready to wound bed as directed Secondary Dressing Zetuvit Plus 4x8 (in/in) Secured With [employer] or Non-Sterile 6-ply 4.5x4 (yd/yd) Discharge Instruction: Apply Kerlix as directed Compression Wrap 3-LAYER WRAP - Profore Lite LF 3 Multilayer Compression [place] Discharge Instruction: Apply 3 multi-layer wrap as prescribed. Compression Stockings Add-Ons Electronic Signature(s) Signed: 09/04/2022 3:52:05 PM By: [name] Loud MSN RN CNS WTA Entered By: [name] on 09/04/2022 10:28:40 -------------------------------------------------------------------------------- Wound Assessment Details Patient Name: Date of Service: [name], [name] 09/04/2022 10:00 [name] Medical Record Number: [id number] Patient Account Number: [id number] Date of Birth/Sex: Treating RN: 1945/10/17 (76 y.o. [name] Primary Care Keshan Reha: [name] Other Clinician: Referring Kasyn Stouffer: Treating Justinn Welter/Extender: [name] in Treatment: 4 Wound Status Wound Number: 16 Primary Etiology: Lesion Wound Location: Right, Medial, Anterior Upper Leg Wound Status: Open Wounding Event: Gradually Appeared Comorbid History: Arrhythmia, Hypertension, Gout Date Acquired: 08/28/2022 [name]  [id number]) [id number] Page 9 of  17 -------------------------------------------------------------------------------- Patient/Caregiver Education Details Patient Name: Date of Service: [name] 11/28/2023andnbsp10:00 [name] Medical Record Number: [id number] Patient Account Number: [id number] Date of Birth/Gender: Treating RN: 1946/09/29 (76 y.o. [name] Primary Care Physician: [name] Other Clinician: Referring Physician: Treating Physician/Extender: [name] in Treatment: 4 Education Assessment Education Provided To: Patient Education Topics Provided Wound/Skin Impairment: Handouts: Caring for Your Ulcer Methods: Explain/Verbal Responses: State content correctly Electronic Signature(s) Signed: 09/04/2022 3:52:05 PM By: [name] Loud MSN RN CNS WTA Entered By: [name] on 09/04/2022 11:29:50 -------------------------------------------------------------------------------- Wound Assessment Details Patient Name: Date of Service: [name], [name] 09/04/2022 10:00 [name] Medical Record Number: [id number] Patient Account Number: [id number] Date of Birth/Sex: Treating RN: 1946-02-18 (76 y.o. [name] Primary Care Cobi Delph: [name] Other Clinician: Referring Chantille Navarrete: Treating Yaritzi Craun/Extender: [name] in Treatment: 4 Wound Status Wound Number: 10 Primary Etiology: Venous Leg Ulcer Wound Location: Left, Medial Lower Leg Wound Status: Healed - Epithelialized Wounding Event: Gradually Appeared Comorbid History: Arrhythmia, Hypertension, Gout Date Acquired: 01/20/2022 Weeks Of Treatment: 4 Clustered Wound: No Photos [name], [name] ([id number]) [id number] Page 10 of 17 Wound Measurements Length: (cm) Width: (cm) Depth: (cm) Area: (cm) Volume: (cm) 0 % Reduction in Area: 100% 0 % Reduction in Volume: 100% 0 0 0 Wound Description Classification: Full Thickness Without Exposed  Support Exudate Amount: Medium Exudate Type: Serous Exudate Color: amber Structures Wound Bed Exposed Structure Fascia Exposed: No Fat Layer (Subcutaneous Tissue) Exposed: Yes Tendon Exposed: No Muscle Exposed: No Joint Exposed: No Bone Exposed: No Treatment Notes Wound #10 (Lower Leg) Wound Laterality: Left, Medial Cleanser Peri-Wound Care Topical Primary Dressing Secondary Dressing Secured With Compression Wrap Compression Stockings Add-Ons Electronic Signature(s) Signed: 09/04/2022 3:52:05 PM By: [name] Loud MSN RN CNS WTA Entered By: [name] on 09/04/2022 11:26:50 Wound Assessment Details -------------------------------------------------------------------------------- [name] ([id number]) [id number] Page 11 of 17 Patient Name: Date of Service: [name] 09/04/2022 10:00 [name] Medical Record Number: [id number] Patient Account Number: [id number] Date of Birth/Sex: Treating RN: 01-09-1946 (76 y.o. [name] Primary Care Joakim Huesman: [name] Other Clinician: Referring Caidence Kaseman: Treating Raeshaun Simson/Extender: [name] in Treatment: 4 Wound Status Wound Number: 13 Primary Etiology: Venous Leg Ulcer Wound Location: Right, Midline Lower Leg Wound Status: Healed - Epithelialized Wounding Event: Gradually Appeared Comorbid History: Arrhythmia, Hypertension, Gout Date Acquired: 01/22/2022 Weeks Of Treatment: 4 Clustered Wound: No Photos Wound Measurements Length: (cm) 0 % Reduction in Area: 100% Width: (cm) 0 % Reduction in Volume: 100% Depth: (cm) 0 Area: (cm) 0 Volume: (cm) 0 Wound Description Classification: Full Thickness Without Exposed Support Structures Exudate Amount: Large Exudate Type: Serous Exudate Color: amber Wound Bed Granulation Amount: Medium (34-66%) Exposed Structure Granulation Quality: Red Fascia Exposed: No Necrotic Amount: None Present (0%) Fat Layer  (Subcutaneous Tissue) Exposed: Yes Tendon Exposed: No Muscle Exposed: No Joint Exposed: No Bone Exposed: No Treatment Notes Wound #13 (Lower Leg) Wound Laterality: Right, Midline Cleanser Peri-Wound Care Topical Primary Dressing Secondary Dressing Secured With Compression Wrap Compression Stockings Add-Ons Electronic Signature(s) Signed: 09/04/2022 3:52:05 PM By: [name] Loud MSN RN CNS WTA Entered By: [name] on 09/04/2022 11:26:50 [name] ([id number]) [id number] Page 12 of 17 -------------------------------------------------------------------------------- Wound Assessment Details Patient Name: Date of Service: [name] 09/04/2022 10:00 [place] Record Number: [id number] Patient Account Number: [id number] Date  of wounds) '[]'$  - 0 Wound Tracing (instead of photographs) '[]'$  - 0 Simple Wound Measurement - one wound X- 3 5 Complex Wound Measurement - multiple wounds INTERVENTIONS - Wound Dressings '[]'$  - 0 Small Wound Dressing one or multiple wounds X- 3 15 Medium Wound Dressing one or multiple wounds '[]'$  - 0 Large Wound Dressing one or multiple wounds '[]'$  - 0 Application of Medications - topical '[]'$  - 0 Application of Medications - injection INTERVENTIONS - Miscellaneous '[]'$  - 0 External ear exam '[]'$  - 0 Specimen Collection (cultures, biopsies, blood, body fluids, etc.) [name], [name] ([id number]) [id number] Page 3 of 17 '[]'$  - 0 Specimen(s) / Culture(s) sent or taken to Lab for analysis '[]'$  - 0 Patient Transfer (multiple staff / [employer] fast streamer / Similar devices) '[]'$  - 0 Simple Staple / Suture removal (25 or less) '[]'$  - 0 Complex Staple / Suture removal (26 or more) '[]'$  - 0 Hypo / Hyperglycemic Management (close monitor of Blood Glucose) '[]'$  - 0 Ankle / Brachial Index (ABI) - do not check if billed separately X- 1 5 Vital Signs Has the patient been seen at the hospital within the last three years: Yes Total Score: 135 Level Of Care: New/Established - Level 4 Electronic Signature(s) Signed: 09/04/2022 3:52:05 PM By: [name] Loud MSN RN CNS WTA Entered By: [name] on 09/04/2022 11:29:24 -------------------------------------------------------------------------------- Compression Therapy Details Patient Name: Date of Service: [name], [name] 09/04/2022 10:00 [name] Medical Record Number: [id number] Patient Account Number: [id number] Date of Birth/Sex: Treating RN: 1945/12/30 (76 y.o. [name] Primary Care Emerson Barretto: [name] Other Clinician: Referring Anita Mcadory: Treating Clovis Warwick/Extender:  [name] in Treatment: 4 Compression Therapy Performed for Wound Assessment: Non-Wound Location Performed By: Clinician [name] Loud, RN Compression Type: Three Layer Location: Lower Extremity, Bilateral Post Procedure Diagnosis Same as Pre-procedure Electronic Signature(s) Signed: 09/04/2022 3:52:05 PM By: [name] Loud MSN RN CNS WTA Entered By: [name] on 09/04/2022 11:53:21 -------------------------------------------------------------------------------- Encounter Discharge Information Details Patient Name: Date of Service: [name], [name] 09/04/2022 10:00 [name] Medical Record Number: [id number] Patient Account Number: [id number] Date of Birth/Sex: Treating RN: June 07, 1946 (76 y.o. [name] Primary Care Mariona Scholes: [name] Other Clinician: PASHA, BROAD ([id number]) [id number] Page 4 of 17 Referring Thurma Priego: Treating Nedra Mcinnis/Extender: [name] in Treatment: 4 Encounter Discharge Information Items Discharge Condition: Stable Ambulatory Status: Ambulatory Discharge Destination: Home Transportation: Private Auto Accompanied By: sister Schedule Follow-up Appointment: Yes Clinical Summary of Care: Electronic Signature(s) Signed: 09/04/2022 3:52:05 PM By: [name] Loud MSN RN CNS WTA Entered By: [name] on 09/04/2022 11:30:29 -------------------------------------------------------------------------------- Lower Extremity Assessment Details Patient Name: Date of Service: [name] [place], [name] 09/04/2022 10:00 [name] Medical Record Number: [id number] Patient Account Number: [id number] Date of Birth/Sex: Treating RN: 20-Aug-1946 (76 y.o. [name] Primary Care Chandlar Guice: [name] Other Clinician: Referring Latara Micheli: Treating Glendell Schlottman/Extender: [name] in Treatment: 4 Edema Assessment Assessed: Shirlyn Goltz: Yes] Patrice Paradise: Yes] [Left: Edema] [Right:  :] Calf Left: Right: Point of Measurement: 32 cm From Medial Instep 36 cm 35 cm Ankle Left: Right: Point of Measurement: 12 cm From Medial Instep 23 cm 25 cm Vascular Assessment Pulses: Dorsalis Pedis Palpable: [Left:Yes] [Right:Yes] Electronic Signature(s) Signed: 09/04/2022 3:52:05 PM By: [name] Loud MSN RN CNS WTA Entered By: [name] on 09/04/2022 11:27:39 [name] ([id number]) [id number] Page 5 of 17 -------------------------------------------------------------------------------- Multi Wound Chart Details Patient Name: Date of Service: [name] RGA

## 2022-09-05 ENCOUNTER — Other Ambulatory Visit: Payer: Self-pay | Admitting: Family Medicine

## 2022-09-05 MED ORDER — RIVAROXABAN 20 MG PO TABS: 20.0000 mg | ORAL_TABLET | Freq: Every day | ORAL | 4 refills | Status: DC

## 2022-09-07 ENCOUNTER — Telehealth: Payer: Self-pay

## 2022-09-07 NOTE — Telephone Encounter (Signed)
Copied from [place] [phone]. Topic: General - Other >> Sep 07, 2022 12:13 PM Burman Freestone wrote: Reason for CRM: Merleen Nicely from [facility] call to advise that pt refused services for home health care

## 2022-09-10 ENCOUNTER — Encounter: Payer: Medicare PPO | Attending: Physician Assistant | Admitting: Physician Assistant

## 2022-09-10 DIAGNOSIS — L97212 Non-pressure chronic ulcer of right calf with fat layer exposed: Secondary | ICD-10-CM | POA: Diagnosis not present

## 2022-09-10 DIAGNOSIS — I428 Other cardiomyopathies: Secondary | ICD-10-CM | POA: Insufficient documentation

## 2022-09-10 DIAGNOSIS — I739 Peripheral vascular disease, unspecified: Secondary | ICD-10-CM | POA: Diagnosis not present

## 2022-09-10 DIAGNOSIS — Z7901 Long term (current) use of anticoagulants: Secondary | ICD-10-CM | POA: Insufficient documentation

## 2022-09-10 DIAGNOSIS — I10 Essential (primary) hypertension: Secondary | ICD-10-CM | POA: Diagnosis not present

## 2022-09-10 DIAGNOSIS — I872 Venous insufficiency (chronic) (peripheral): Secondary | ICD-10-CM | POA: Diagnosis not present

## 2022-09-10 DIAGNOSIS — L988 Other specified disorders of the skin and subcutaneous tissue: Secondary | ICD-10-CM | POA: Diagnosis not present

## 2022-09-10 DIAGNOSIS — L97822 Non-pressure chronic ulcer of other part of left lower leg with fat layer exposed: Secondary | ICD-10-CM | POA: Diagnosis not present

## 2022-09-10 DIAGNOSIS — I87333 Chronic venous hypertension (idiopathic) with ulcer and inflammation of bilateral lower extremity: Secondary | ICD-10-CM | POA: Diagnosis not present

## 2022-09-10 DIAGNOSIS — I480 Paroxysmal atrial fibrillation: Secondary | ICD-10-CM | POA: Insufficient documentation

## 2022-09-10 DIAGNOSIS — L97812 Non-pressure chronic ulcer of other part of right lower leg with fat layer exposed: Secondary | ICD-10-CM | POA: Insufficient documentation

## 2022-09-10 NOTE — Progress Notes (Addendum)
KIJUAN, [name] [name] ([id number]) [id number] Page 1 of 11 Visit Report for 09/10/2022 Chief Complaint Document Details Patient Name: Date of Service: [name] 09/10/2022 8:45 [name] M Medical Record Number: [id number] Patient Account Number: [id number] Date of Birth/Sex: Treating RN: 03/30/1946 (76 y.o. [name] Primary Care Provider: Lelon [name] Other Clinician: Referring Provider: Treating Provider/Extender: [name] in Treatment: 5 Information Obtained from: Patient Chief Complaint Bilateral LE Ulcers Electronic Signature(s) Signed: 09/10/2022 8:44:59 AM By: [name] Keeler PA-C Entered By: [name] on 09/10/2022 08:44:59 -------------------------------------------------------------------------------- HPI Details Patient Name: Date of Service: [name], [name] 09/10/2022 8:45 [name] M Medical Record Number: [id number] Patient Account Number: [id number] Date of Birth/Sex: Treating RN: 1946-03-22 (76 y.o. [name], [name] Primary Care Provider: Lelon [name] Other Clinician: Referring Provider: Treating Provider/Extender: [name] in Treatment: 5 History of Present Illness HPI Description: 76 year old male who has [name] past medical history of essential hypertension, chronic atrial fibrillation, peripheral vascular disease, nonischemic cardiomyopathy,venous stasis dermatitis, gouty arthropathy, basal cell carcinoma of the right lower extremity, benign prostatic hypertrophy, long- term use of anticoagulation therapy, hyperglycemia and exercise intolerance has never been [name] smoker. the patient has had [name] vascular workup over 7 years ago and said everything was normal at that stage. He does not have any chronic problems except for cardiac issues which he sees [name] cardiologist in [place]. 08/15/2017 -- arterial and venous duplex studies still pending. 08/23/2017 -- venous reflux studies done on 08/13/2017  shows venous incompetence throughout the left lower extremity deep system and focally at the left saphenofemoral junction. 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 No evidence of SVT or DVT in bilateral lower extremities The patient has an appointment at the end of the month to get his arterial duplex study done 09/05/2017 -- the patient was seen at the vein and vascular office yesterday by [name] ABI studies were notable for medial calcification and the toe brachial indices were normal and bilateral ankle-brachial) waveforms were normal with triphasic flow. After review of his venous studies he was not [name] candidate for laser ablation and his lymphedema was to be treated with compression stockings and lymphedema pump pumps 09/12/2017 -- had [name] low arterial study done at the [facility] -- unable to obtain reliable ABI is due to medial calcification. Bilateral toe [name], [name] ([id number]) [id number] Page 2 of 11 indices were normal with the right being 1.01 and the left being 0.92 and the waveforms were triphasic bilaterally. he did get hold of 30-40 mm compression stockings but is unable to put these on. We will try and get him alternative compression stockings. 09/26/17- he is here in follow up evaluation of [name] right lower extremity ulcer;he is compliant in wearing compression stocking; ulcer almost epithelialized , anticipate healing next appointment Readmission: 11/17 point upon evaluation patient's wound currently that he is seeing [place] for today is [name] skin cancerous lesion that was cleared away by his dermatologist on the left medial calf region. He tells me that this is [name] very similar thing to what he had done previously in fact the last time he saw him in 2018 this was also what was going on at that point. Nonetheless he feels that based on what he seeing currently that this is just having [name] lot of harder time  healing although it is much closer to the surface than what he is experienced in  the past. He notes that the initial removal was in June 2022 which was this year this is now November and still has not closed. He does have some edema and definitely I think that there is some venous component to his slow healing here. Also think that we can do something better than Vaseline to try to help with getting this to clear up as quickly as possible. He does have [name] history of atrial fibrillation and is on Eliquis otherwise he really has no major medical problems that would affect wound healing. 09/07/2021 upon evaluation today patient actually appears to be doing significantly better after having wrapped him last week. Overall I think that this is making significant improvements at this time which is great news. I do not see any evidence of infection which is great news as well. No fevers, chills, nausea, vomiting, or diarrhea. 09/14/2021 upon evaluation today patient appears to be doing well with regard to his leg ulcer. He has been tolerating the dressing changes and overall I think that he is making excellent progress. I do not see any signs of active infection at this time. 09/21/2021 upon evaluation today patient actually appears to be making good progress with regard to his wound this is again measuring smaller today no debridement seems to be necessary. We have been using [name] silver collagen dressing and I think that is doing an awesome job. 09/28/2021 upon evaluation today patient appears to be doing well with regard to his leg currently. I do not see any signs of active infection at this time which is great news. No fevers, chills, nausea, vomiting, or diarrhea. I think this wound is very close to complete resolution. 10/12/2021 upon evaluation today patient actually appears to be doing awesome in regard to his leg ulcer. In fact this appears to be completely healed based on what I am seeing currently. I do  not see any evidence of active infection locally nor systemically at this time which is also great news. No fevers, chills, nausea, vomiting, or diarrhea. Readmission: 12/07/2021 upon evaluation today patient presents for readmission here in the clinic. He was discharged on 10/12/2021 is completely healed. Unfortunately this has reopened at this point and he is having continual issues with new blisters over both lower extremities. This is even worse than what we previously saw. Nonetheless we did actually check his ABIs today and it did reveal that his ABIs were 0.55 on the left and 0.57 on the right. Subsequently this is [name] definite change from his last arterial study which showed that he did have good blood flow at 1.01 on the right and 0.92 on the left and that was right at the beginning of 2019. Nonetheless based on what we see currently I do think he tolerated the 3 layer compression wrap but I do believe that we probably need to get him tested for his arterial flow in order to see where things stand and if there is something we can do there that would help prevent this from continue to be an ongoing issue. He did not utilize compression socks in the interim from when he was last here till this time. That something is probably going to need lifelong going forward as well. 3/9; patient presents for follow-up. He has no issues or complaints today. He tolerated the compression wrap well. He had ABIs with TBI's done. He denies signs of infection. 12/21/2021 upon evaluation today patient appears to be doing well with regard to the wounds on his  legs. Both are showing signs of significant improvement which is great news although I do believe some sharp debridement would be of benefit here as well. 12/28/2021 upon evaluation today patient appears to be doing well with regard to his wounds. Everything is showing signs of excellent improvement which I am very pleased about. I think that we are headed in the  right direction here. Fortunately there does not appear to be any evidence of infection which is great news there is [name] little bit of hypergranulation. 01/04/2022 upon evaluation today patient appears to be doing well with regard to his wounds 2 of them are healed 1 is almost so and the other 1 is significantly better. Overall I am extremely pleased with where we stand and I think that he is making excellent progress here. I do not see any evidence of active infection locally nor systemically at this time. 01-16-2022 upon evaluation today patient's wound on the left leg is showing signs of doing quite well. Has not completely cleared at this point but it is much improved. Fortunately I do not see any signs of infection at this time. No fevers, chills, nausea, vomiting, or diarrhea. 01-23-2022 upon evaluation today patient's wound of the left leg actually appears to be pretty much completely healed which is great news. I do not see any signs of active infection locally or systemically which is excellent. With that being said on the right leg what wound is measuring smaller the other 1 is [name] new wound that just showed up fortunately its not too bad. Has been using Xeroform here and that seems to be doing decently well which is great news. Unfortunately his blood pressure is significantly high we gave him the readings for the past 4-5 visits as well as [name] recommendation to make an appointment to go discuss this with his primary care provider patient states that he is going to look into doing this. 01-30-2022 upon evaluation today patient appears to be doing well with regard to his left leg everything appears to be healed. On the right leg the more anterior wound is healed the more medial wound that I been concerned about [name] possible skin cancer unfortunately still does not look great to me. I do believe that we should probably do [name] biopsy I have talked about it with him [name] few times I think though it is probably  time to go ahead and do this at this point. 02-09-2022 upon evaluation today patient appears to be doing well with regard to his legs. On the left this appears to be completely healed. On the right he does have 2 areas and be perfectly honest one of them is [name] skin cancer that he is going to the Mohs surgery clinic for the other seems to be healing nicely. Readmission: 08-02-2022 upon evaluation today patient appears for reevaluation here in our clinic concerning issues that he has been having with wounds over the bilateral lower extremities. I last saw him in May 2023 and at that point we had him completely healed. Unfortunately he is tells me this has broken down to some degree since that point. 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 He tells me this 1 seems to just be getting bigger and bigger not improving. Fortunately he is  not having any significant pain which is good news he does have quite [name] bit of swelling and he tells me that his fluid pills are not recommended for him to take daily but just in 3-day intervals here and there. 08-09-2022 upon evaluation today patient appears to be doing still somewhat poorly in regard to his legs although in general he does not appear to be feeling as good as he has been. Fortunately there does not appear to be any signs of infection which is good news. With that being said he is having some issues here with having and overall poor feeling in general which again is good I think going to be the biggest complicating factor. He actually seems to be coughing I do not hear any wheezing right now I did listen to his chest he did not have good airflow down low however makes me suspicious for bronchitis or even possibly pneumonia which could be part of what is going on  here as well. Fortunately I do not see any evidence of anything worsening in regard to his legs but I definitely believe that he needs to continue with the compression wraps he took them off yesterday to shower has not had anything on for 24 hours that is why his legs are so swollen today. With regard to his pathology report I did review that it showed some squamous abnormality but no signs of distinct carcinoma. With that being said it was LUISCARLOS, KACZMARCZYK ([id number]) [id number] Page 3 of 11 saying that it could be adjacent to [name] squamous cell carcinoma nonetheless my suggestion is can be that we have the patient take copy of this report and give it to his Mohs surgeon in order for them to see if there is anything they feel like needs to be done further. With that being said right now I feel like the primary thing is going to be for [place] to try to get his swelling down and keep that down into that hand since he is having so much drainage I believe we can have to bring him in for dressing changes twice [name] week doing [name] nurse visit on Mondays. 11/9; since the patient was last here he spent the night in the emergency room he received IV Lasix. Also received antibiotics although he was not discharged on either 1 of these. He also saw his cardiology office who put him on regular Lasix 20 mg [previously on as needed Lasix 20 mg]. Per our intake nurse the swelling in his legs is remarkably better but he still has bilateral lower extremity wounds. He still has wounds on the bilateral lower extremities most problematically on the left lateral calf. He has been using silver alginate under 3 layer compression. 08-23-2022 upon evaluation today patient appears to be doing much better than the last time I saw him 2 weeks ago. At that point I was very concerned about how he was doing he did see Dr. Caryn Section his primary care provider they got him on some blood pressure medication in general his  color and overall appearance looks to be doing much improved compared to the last time I saw him. 09-04-2022 upon evaluation today patient appears to be doing well currently in regard to his wounds. Everything is showing signs of improvement which is great news. Fortunately there does not appear to be any signs of active infection locally or systemically at this time. No fevers, chills, nausea, vomiting, or diarrhea. 09-10-2022 upon evaluation today patient appears to  be doing better in regard to his wounds although the [facility] was extremely stuck to the wound bed. Fortunately there does not appear to be any signs of infection locally or systemically at this time which is great news. No fevers, chills, nausea, vomiting, or diarrhea. Electronic Signature(s) Signed: 09/10/2022 10:19:16 AM By: [name] Keeler PA-C Entered By: [name] on 09/10/2022 10:19:16 -------------------------------------------------------------------------------- Physical Exam Details Patient Name: Date of Service: [name] Perryman, [name] 09/10/2022 8:45 [name] M Medical Record Number: [id number] Patient Account Number: [id number] Date of Birth/Sex: Treating RN: 12-24-1945 (76 y.o. [name] Primary Care Provider: Lelon [name] Other Clinician: Referring Provider: Treating Provider/Extender: [name] in Treatment: 5 Constitutional Well-nourished and well-hydrated in no acute distress. Respiratory normal breathing without difficulty. Psychiatric this patient is able to make decisions and demonstrates good insight into disease process. Alert and Oriented x 3. pleasant and cooperative. Notes Upon inspection patient's wound bed actually showed signs again of no need for sharp debridement he had some good epithelial growth around the edges of the wound Hydrofera Blue however was extremely stuck to the wound bed. We have therefore switched over to [name] silver alginate dressing which I think may  be [name] better option going forward. The patient is in agreement with that plan. Electronic Signature(s) Signed: 09/10/2022 10:19:39 AM By: [name] Keeler PA-C Entered By: [name] on 09/10/2022 10:19:39 [name], [name] ([id number]) [id number] Page 4 of 11 -------------------------------------------------------------------------------- Physician Orders Details Patient Name: Date of Service: [name] Hollis. 09/10/2022 8:45 [name] M Medical Record Number: [id number] Patient Account Number: [id number] Date of Birth/Sex: Treating RN: [birth year] (76 y.o. [name], [name] Primary Care Provider: Lelon [name] Other Clinician: Referring Provider: Treating Provider/Extender: [name] in Treatment: 5 Verbal / Phone Orders: No Diagnosis Coding ICD-10 Coding Code Description [phone] Chronic venous hypertension (idiopathic) with ulcer and inflammation of bilateral lower extremity L97.822 Non-pressure chronic ulcer of other part of left lower leg with fat layer exposed L97.812 Non-pressure chronic ulcer of other part of right lower leg with fat layer exposed I73.89 Other specified peripheral vascular diseases I48.0 Paroxysmal atrial fibrillation I10 Essential (primary) hypertension Follow-up Appointments Return Appointment in 1 week. Bathing/ Shower/ Hygiene May shower with wound dressing protected with water repellent cover or cast protector. No tub bath. Anesthetic (Use 'Patient Medications' Section for Anesthetic Order Entry) Wound #9 Left,Lateral Lower Leg Lidocaine applied to wound bed Edema Control - Lymphedema / Segmental Compressive Device / Other Bilateral Lower Extremities Elevate, Exercise Daily and [name] void Standing for Long Periods of Time. Elevate legs to the level of the heart and pump ankles as often as possible Elevate leg(s) parallel to the floor when sitting. DO YOUR BEST to sleep in the bed at night. DO NOT sleep in your  recliner. Long hours of sitting in [name] recliner leads to swelling of the legs and/or potential wounds on your backside. Additional Orders / Instructions Follow Nutritious Diet and Increase Protein Intake Wound Treatment Wound #15 - Lower Leg Wound Laterality: Left, Posterior Prim Dressing: Silvercel Small 2x2 (in/in) 1 x Per Week/30 Days ary Discharge Instructions: Apply Silvercel Small 2x2 (in/in) as instructed Secondary Dressing: Zetuvit Plus 4x8 (in/in) 1 x Per Week/30 Days Secured With: [employer] Sterile or Non-Sterile 6-ply 4.5x4 (yd/yd) 1 x Per Week/30 Days Discharge Instructions: Apply Kerlix as directed Compression Wrap: 3-LAYER WRAP - Profore Lite LF 3 Multilayer Compression Bandaging System 1 x Per Week/30  Days Discharge Instructions: Apply 3 multi-layer wrap as prescribed. Wound #16 - Lower Leg Wound Laterality: Right, Medial, Anterior Prim Dressing: Silvercel Small 2x2 (in/in) 1 x Per Week/30 Days ary Discharge Instructions: Apply Silvercel Small 2x2 (in/in) as instructed Secondary Dressing: Zetuvit Plus 4x8 (in/in) 1 x Per Week/30 Days Secured With: [employer] Sterile or Non-Sterile 6-ply 4.5x4 (yd/yd) 1 x Per Week/30 Days [name], [name] ([id number]) [id number] Page 5 of 11 Discharge Instructions: Apply Kerlix as directed Compression Wrap: 3-LAYER WRAP - Profore Lite LF 3 Multilayer Compression Bandaging System 1 x Per Week/30 Days Discharge Instructions: Apply 3 multi-layer wrap as prescribed. Wound #17 - Lower Leg Wound Laterality: Right, Midline, Posterior Prim Dressing: Silvercel Small 2x2 (in/in) 1 x Per Week/30 Days ary Discharge Instructions: Apply Silvercel Small 2x2 (in/in) as instructed Secondary Dressing: Zetuvit Plus 4x8 (in/in) 1 x Per Week/30 Days Secured With: [employer] Sterile or Non-Sterile 6-ply 4.5x4 (yd/yd) 1 x Per Week/30 Days Discharge Instructions: Apply Kerlix as directed Compression Wrap: 3-LAYER WRAP - Profore Lite  LF 3 Multilayer Compression Bandaging System 1 x Per Week/30 Days Discharge Instructions: Apply 3 multi-layer wrap as prescribed. Wound #18 - Lower Leg Wound Laterality: Right, Lateral, Posterior Prim Dressing: Silvercel Small 2x2 (in/in) 1 x Per Week/30 Days ary Discharge Instructions: Apply Silvercel Small 2x2 (in/in) as instructed Secondary Dressing: Zetuvit Plus 4x8 (in/in) 1 x Per Week/30 Days Secured With: [employer] Sterile or Non-Sterile 6-ply 4.5x4 (yd/yd) 1 x Per Week/30 Days Discharge Instructions: Apply Kerlix as directed Compression Wrap: 3-LAYER WRAP - Profore Lite LF 3 Multilayer Compression Bandaging System 1 x Per Week/30 Days Discharge Instructions: Apply 3 multi-layer wrap as prescribed. Wound #9 - Lower Leg Wound Laterality: Left, Lateral Prim Dressing: Silvercel Small 2x2 (in/in) 1 x Per Week/30 Days ary Discharge Instructions: Apply Silvercel Small 2x2 (in/in) as instructed Secondary Dressing: Zetuvit Plus 4x8 (in/in) 1 x Per Week/30 Days Secured With: [employer] Sterile or Non-Sterile 6-ply 4.5x4 (yd/yd) 1 x Per Week/30 Days Discharge Instructions: Apply Kerlix as directed Compression Wrap: 3-LAYER WRAP - Profore Lite LF 3 Multilayer Compression Bandaging System 1 x Per Week/30 Days Discharge Instructions: Apply 3 multi-layer wrap as prescribed. Electronic Signature(s) Signed: 09/10/2022 2:54:45 PM By: Gretta Cool, BSN, RN, CWS, Kim RN, BSN Signed: 09/10/2022 4:48:10 PM By: [name] Keeler PA-C Entered By: Gretta Cool BSN, RN, CWS, Kim on 09/10/2022 09:31:15 -------------------------------------------------------------------------------- Problem List Details Patient Name: Date of Service: [name] [name], [name] 09/10/2022 8:45 [name] M Medical Record Number: [id number] Patient Account Number: [id number] Date of Birth/Sex: Treating RN: 1945/10/30 (76 y.o. [name] Primary Care Provider: Lelon [name] Other Clinician: Referring Provider: Treating Provider/Extender: [name] in Treatment: 5 Active Problems ICD-10 [name], [name] ([id number]) [id number] Page 6 of 11 Encounter Code Description Active Date MDM Diagnosis I87.333 Chronic venous hypertension (idiopathic) with ulcer and inflammation of 08/02/2022 No Yes bilateral lower extremity L97.822 Non-pressure chronic ulcer of other part of left lower leg with fat layer 08/02/2022 No Yes exposed L97.812 Non-pressure chronic ulcer of other part of right lower leg with fat layer 08/02/2022 No Yes exposed I73.89 Other specified peripheral vascular diseases 08/02/2022 No Yes I48.0 Paroxysmal atrial fibrillation 08/02/2022 No Yes I10 Essential (primary) hypertension 08/02/2022 No Yes Inactive Problems Resolved Problems Electronic Signature(s) Signed: 09/10/2022 8:44:54 AM By: [name] Keeler PA-C Entered By: [name] on 09/10/2022 08:44:53 -------------------------------------------------------------------------------- Progress Note Details Patient Name: Date of Service: [name] [place], [name] 09/10/2022 8:45 [name]  M Medical Record Number: [id number] Patient Account Number: [id number] Date of Birth/Sex: Treating RN: 19-Mar-1946 (76 y.o. [name], [name] Primary Care Provider: Lelon [name] Other Clinician: Referring Provider: Treating Provider/Extender: [name] in Treatment: 5 Subjective Chief Complaint Information obtained from Patient Bilateral LE Ulcers History of Present Illness (HPI) 76 year old male who has [name] past medical history of essential hypertension, chronic atrial fibrillation, peripheral vascular disease, nonischemic cardiomyopathy,venous stasis dermatitis, gouty arthropathy, basal cell carcinoma of the right lower extremity, benign prostatic hypertrophy, long-term use of anticoagulation therapy, hyperglycemia and exercise intolerance has never been [name] smoker. the patient has had [name] vascular workup over 7 years ago  and said everything was normal at that stage. He does not have any chronic problems except for cardiac issues which he sees [name] cardiologist in [place]. 08/15/2017 -- arterial and venous duplex studies still pending. 08/23/2017 -- venous reflux studies done on 08/13/2017 shows venous incompetence throughout the left lower extremity deep system and focally at the left [name], [name] ([id number]) [id number] Page 7 of 11 saphenofemoral junction. 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 No evidence of SVT or DVT in bilateral lower extremities The patient has an appointment at the end of the month to get his arterial duplex study done 09/05/2017 -- the patient was seen at the vein and vascular office yesterday by [name] ABI studies were notable for medial calcification and the toe brachial indices were normal and bilateral ankle-brachial) waveforms were normal with triphasic flow. After review of his venous studies he was not [name] candidate for laser ablation and his lymphedema was to be treated with compression stockings and lymphedema pump pumps 09/12/2017 -- had [name] low arterial study done at the [facility] -- unable to obtain reliable ABI is due to medial calcification. Bilateral toe indices were normal with the right being 1.01 and the left being 0.92 and the waveforms were triphasic bilaterally. he did get hold of 30-40 mm compression stockings but is unable to put these on. We will try and get him alternative compression stockings. 09/26/17- he is here in follow up evaluation of [name] right lower extremity ulcer;he is compliant in wearing compression stocking; ulcer almost epithelialized , anticipate healing next appointment Readmission: 11/17 point upon evaluation patient's wound currently that he is seeing [place] for today is [name] skin cancerous lesion that was cleared away by his dermatologist on the left medial calf region. He  tells me that this is [name] very similar thing to what he had done previously in fact the last time he saw him in 2018 this was also what was going on at that point. Nonetheless he feels that based on what he seeing currently that this is just having [name] lot of harder time healing although it is much closer to the surface than what he is experienced in the past. He notes that the initial removal was in June 2022 which was this year this is now November and still has not closed. He does have some edema and definitely I think that there is some venous component to his slow healing here. Also think that we can do something better than Vaseline to try to help with getting this to clear up as quickly as possible. He does have [name] history of atrial fibrillation and is on Eliquis otherwise he really has no major medical problems that would affect wound healing. 09/07/2021 upon evaluation today patient actually appears to be doing significantly better after  having wrapped him last week. Overall I think that this is making significant improvements at this time which is great news. I do not see any evidence of infection which is great news as well. No fevers, chills, nausea, vomiting, or diarrhea. 09/14/2021 upon evaluation today patient appears to be doing well with regard to his leg ulcer. He has been tolerating the dressing changes and overall I think that he is making excellent progress. I do not see any signs of active infection at this time. 09/21/2021 upon evaluation today patient actually appears to be making good progress with regard to his wound this is again measuring smaller today no debridement seems to be necessary. We have been using [name] silver collagen dressing and I think that is doing an awesome job. 09/28/2021 upon evaluation today patient appears to be doing well with regard to his leg currently. I do not see any signs of active infection at this time which is great news. No fevers, chills, nausea,  vomiting, or diarrhea. I think this wound is very close to complete resolution. 10/12/2021 upon evaluation today patient actually appears to be doing awesome in regard to his leg ulcer. In fact this appears to be completely healed based on what I am seeing currently. I do not see any evidence of active infection locally nor systemically at this time which is also great news. No fevers, chills, nausea, vomiting, or diarrhea. Readmission: 12/07/2021 upon evaluation today patient presents for readmission here in the clinic. He was discharged on 10/12/2021 is completely healed. Unfortunately this has reopened at this point and he is having continual issues with new blisters over both lower extremities. This is even worse than what we previously saw. Nonetheless we did actually check his ABIs today and it did reveal that his ABIs were 0.55 on the left and 0.57 on the right. Subsequently this is [name] definite change from his last arterial study which showed that he did have good blood flow at 1.01 on the right and 0.92 on the left and that was right at the beginning of 2019. Nonetheless based on what we see currently I do think he tolerated the 3 layer compression wrap but I do believe that we probably need to get him tested for his arterial flow in order to see where things stand and if there is something we can do there that would help prevent this from continue to be an ongoing issue. He did not utilize compression socks in the interim from when he was last here till this time. That something is probably going to need lifelong going forward as well. 3/9; patient presents for follow-up. He has no issues or complaints today. He tolerated the compression wrap well. He had ABIs with TBI's done. He denies signs of infection. 12/21/2021 upon evaluation today patient appears to be doing well with regard to the wounds on his legs. Both are showing signs of significant improvement which is great news although I do believe  some sharp debridement would be of benefit here as well. 12/28/2021 upon evaluation today patient appears to be doing well with regard to his wounds. Everything is showing signs of excellent improvement which I am very pleased about. I think that we are headed in the right direction here. Fortunately there does not appear to be any evidence of infection which is great news there is [name] little bit of hypergranulation. 01/04/2022 upon evaluation today patient appears to be doing well with regard to his wounds 2 of them are  healed 1 is almost so and the other 1 is significantly better. Overall I am extremely pleased with where we stand and I think that he is making excellent progress here. I do not see any evidence of active infection locally nor systemically at this time. 01-16-2022 upon evaluation today patient's wound on the left leg is showing signs of doing quite well. Has not completely cleared at this point but it is much improved. Fortunately I do not see any signs of infection at this time. No fevers, chills, nausea, vomiting, or diarrhea. 01-23-2022 upon evaluation today patient's wound of the left leg actually appears to be pretty much completely healed which is great news. I do not see any signs of active infection locally or systemically which is excellent. With that being said on the right leg what wound is measuring smaller the other 1 is [name] new wound that just showed up fortunately its not too bad. Has been using Xeroform here and that seems to be doing decently well which is great news. Unfortunately his blood pressure is significantly high we gave him the readings for the past 4-5 visits as well as [name] recommendation to make an appointment to go discuss this with his primary care provider patient states that he is going to look into doing this. 01-30-2022 upon evaluation today patient appears to be doing well with regard to his left leg everything appears to be healed. On the right leg the more  anterior wound is healed the more medial wound that I been concerned about [name] possible skin cancer unfortunately still does not look great to me. I do believe that we should probably do [name] biopsy I have talked about it with him [name] few times I think though it is probably time to go ahead and do this at this point. 02-09-2022 upon evaluation today patient appears to be doing well with regard to his legs. On the left this appears to be completely healed. On the right he does have 2 areas and be perfectly honest one of them is [name] skin cancer that he is going to the Mohs surgery clinic for the other seems to be healing nicely. Readmission: 08-02-2022 upon evaluation today patient appears for reevaluation here in our clinic concerning issues that he has been having with wounds over the bilateral lower extremities. I last saw him in May 2023 and at that point we had him completely healed. Unfortunately he is tells me this has broken down to some degree since that point. 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 He tells me this 1 seems to just be getting bigger and bigger not improving. Fortunately he is not having any significant pain which is good news he does have quite [name] bit of swelling and he tells me that his fluid pills are not recommended for him to take daily but just in 3-day intervals here and there. 08-09-2022 upon evaluation today patient appears to be doing still somewhat poorly in regard to his legs although in general he does not appear to be feeling as [name], [name] [id number] Page 8 of 11 good as he has been. Fortunately there does not appear to be any signs of infection which is good news. With that being said he is having some issues  here with having  and overall poor feeling in general which again is good I think going to be the biggest complicating factor. He actually seems to be coughing I do not hear any wheezing right now I did listen to his chest he did not have good airflow down low however makes me suspicious for bronchitis or even possibly pneumonia which could be part of what is going on here as well. Fortunately I do not see any evidence of anything worsening in regard to his legs but I definitely believe that he needs to continue with the compression wraps he took them off yesterday to shower has not had anything on for 24 hours that is why his legs are so swollen today. With regard to his pathology report I did review that it showed some squamous abnormality but no signs of distinct carcinoma. With that being said it was saying that it could be adjacent to [name] squamous cell carcinoma nonetheless my suggestion is can be that we have the patient take copy of this report and give it to his Mohs surgeon in order for them to see if there is anything they feel like needs to be done further. With that being said right now I feel like the primary thing is going to be for [place] to try to get his swelling down and keep that down into that hand since he is having so much drainage I believe we can have to bring him in for dressing changes twice [name] week doing [name] nurse visit on Mondays. 11/9; since the patient was last here he spent the night in the emergency room he received IV Lasix. Also received antibiotics although he was not discharged on either 1 of these. He also saw his cardiology office who put him on regular Lasix 20 mg [previously on as needed Lasix 20 mg]. Per our intake nurse the swelling in his legs is remarkably better but he still has bilateral lower extremity wounds. He still has wounds on the bilateral lower extremities most problematically on the left lateral calf. He has been using silver alginate under 3 layer compression. 08-23-2022  upon evaluation today patient appears to be doing much better than the last time I saw him 2 weeks ago. At that point I was very concerned about how he was doing he did see Dr. Caryn Section his primary care provider they got him on some blood pressure medication in general his color and overall appearance looks to be doing much improved compared to the last time I saw him. 09-04-2022 upon evaluation today patient appears to be doing well currently in regard to his wounds. Everything is showing signs of improvement which is great news. Fortunately there does not appear to be any signs of active infection locally or systemically at this time. No fevers, chills, nausea, vomiting, or diarrhea. 09-10-2022 upon evaluation today patient appears to be doing better in regard to his wounds although the [facility] was extremely stuck to the wound bed. Fortunately there does not appear to be any signs of infection locally or systemically at this time which is great news. No fevers, chills, nausea, vomiting, or diarrhea. Objective Constitutional Well-nourished and well-hydrated in no acute distress. Vitals Time Taken: 8:52 AM, Height: 75 in, Weight: 225 lbs, BMI: 28.1, Temperature: 98.2 F, Pulse: 60 bpm, Respiratory Rate: 16 breaths/min, Blood Pressure: 123/62 mmHg. Respiratory normal breathing without difficulty. Psychiatric this patient is able to make decisions and demonstrates good insight into disease process. Alert and Oriented x  3. pleasant and cooperative. General Notes: Upon inspection patient's wound bed actually showed signs again of no need for sharp debridement he had some good epithelial growth around the edges of the wound Hydrofera Blue however was extremely stuck to the wound bed. We have therefore switched over to [name] silver alginate dressing which I think may be [name] better option going forward. The patient is in agreement with that plan. Integumentary (Hair, Skin) Wound #15 status is Open.  Original cause of wound was Gradually Appeared. The date acquired was: 08/28/2022. The wound is located on the Left,Posterior Lower Leg. The wound measures 0.7cm length x 0.5cm width x 0.1cm depth; 0.275cm^2 area and 0.027cm^3 volume. There is [name] medium amount of sanguinous drainage noted. Wound #16 status is Open. Original cause of wound was Gradually Appeared. The date acquired was: 08/28/2022. The wound is located on the Right,Medial,Anterior Lower Leg. The wound measures 0.6cm length x 0.4cm width x 0.1cm depth; 0.188cm^2 area and 0.019cm^3 volume. There is [name] medium amount of sanguinous drainage noted. Wound #17 status is Open. Original cause of wound was Gradually Appeared. The date acquired was: 09/10/2022. The wound is located on the Right,Midline,Posterior Lower Leg. The wound measures 0.2cm length x 0.3cm width x 0.1cm depth; 0.047cm^2 area and 0.005cm^3 volume. Wound #18 status is Open. Original cause of wound was Gradually Appeared. The date acquired was: 09/10/2022. The wound is located on the Right,Lateral,Posterior Lower Leg. The wound measures 0.6cm length x 0.3cm width x 0.1cm depth; 0.141cm^2 area and 0.014cm^3 volume. Wound #9 status is Open. Original cause of wound was Gradually Appeared. The date acquired was: 01/20/2022. The wound has been in treatment 5 weeks. The wound is located on the Left,Lateral Lower Leg. The wound measures 3.9cm length x 2.2cm width x 0.1cm depth; 6.739cm^2 area and 0.674cm^3 volume. There is [name] large amount of serosanguineous drainage noted. Assessment Active Problems ICD-10 Chronic venous hypertension (idiopathic) with ulcer and inflammation of bilateral lower extremity Non-pressure chronic ulcer of other part of left lower leg with fat layer exposed Non-pressure chronic ulcer of other part of right lower leg with fat layer exposed Other specified peripheral vascular diseases [name], [name] ([id number]) [id number] Page 9 of  11 Paroxysmal atrial fibrillation Essential (primary) hypertension Procedures Wound #15 Pre-procedure diagnosis of Wound #15 is [name] Lesion located on the Left,Posterior Lower Leg . There was [name] Three Layer Compression Therapy Procedure with [name] pre-treatment ABI of 0.7 by Cornell Barman, RN. Post procedure Diagnosis Wound #15: Same as Pre-Procedure Wound #16 Pre-procedure diagnosis of Wound #16 is [name] Lesion located on the Right,Medial,Anterior Lower Leg . There was [name] Three Layer Compression Therapy Procedure with [name] pre-treatment ABI of 0.7 by Cornell Barman, RN. Post procedure Diagnosis Wound #16: Same as Pre-Procedure Wound #17 Pre-procedure diagnosis of Wound #17 is [name] Lesion located on the Right,Midline,Posterior Lower Leg . There was [name] Three Layer Compression Therapy Procedure with [name] pre-treatment ABI of 0.7 by Cornell Barman, RN. Post procedure Diagnosis Wound #17: Same as Pre-Procedure Wound #18 Pre-procedure diagnosis of Wound #18 is [name] Lesion located on the Right,Lateral,Posterior Lower Leg . There was [name] Three Layer Compression Therapy Procedure with [name] pre-treatment ABI of 0.7 by Cornell Barman, RN. Post procedure Diagnosis Wound #18: Same as Pre-Procedure Wound #9 Pre-procedure diagnosis of Wound #9 is [name] Malignant Wound located on the Left,Lateral Lower Leg . There was [name] Three Layer Compression Therapy Procedure with [name] pre-treatment ABI of 0.7 by Cornell Barman, RN. Post procedure Diagnosis Wound #9: Same  as Pre-Procedure Plan Follow-up Appointments: Return Appointment in 1 week. Bathing/ Shower/ Hygiene: May shower with wound dressing protected with water repellent cover or cast protector. No tub bath. Anesthetic (Use 'Patient Medications' Section for Anesthetic Order Entry): Wound #9 Left,Lateral Lower Leg: Lidocaine applied to wound bed Edema Control - Lymphedema / Segmental Compressive Device / Other: Elevate, Exercise Daily and Avoid Standing for Long Periods of Time. Elevate legs to the level  of the heart and pump ankles as often as possible Elevate leg(s) parallel to the floor when sitting. DO YOUR BEST to sleep in the bed at night. DO NOT sleep in your recliner. Long hours of sitting in [name] recliner leads to swelling of the legs and/or potential wounds on your backside. Additional Orders / Instructions: Follow Nutritious Diet and Increase Protein Intake WOUND #15: - Lower Leg Wound Laterality: Left, Posterior Prim Dressing: Silvercel Small 2x2 (in/in) 1 x Per Week/30 Days ary Discharge Instructions: Apply Silvercel Small 2x2 (in/in) as instructed Secondary Dressing: Zetuvit Plus 4x8 (in/in) 1 x Per Week/30 Days Secured With: [employer] Sterile or Non-Sterile 6-ply 4.5x4 (yd/yd) 1 x Per Week/30 Days Discharge Instructions: Apply Kerlix as directed Com pression Wrap: 3-LAYER WRAP - Profore Lite LF 3 Multilayer Compression Bandaging System 1 x Per Week/30 Days Discharge Instructions: Apply 3 multi-layer wrap as prescribed. WOUND #16: - Lower Leg Wound Laterality: Right, Medial, Anterior Prim Dressing: Silvercel Small 2x2 (in/in) 1 x Per Week/30 Days ary Discharge Instructions: Apply Silvercel Small 2x2 (in/in) as instructed Secondary Dressing: Zetuvit Plus 4x8 (in/in) 1 x Per Week/30 Days Secured With: [employer] Sterile or Non-Sterile 6-ply 4.5x4 (yd/yd) 1 x Per Week/30 Days Discharge Instructions: Apply Kerlix as directed Com pression Wrap: 3-LAYER WRAP - Profore Lite LF 3 Multilayer Compression Bandaging System 1 x Per Week/30 Days Discharge Instructions: Apply 3 multi-layer wrap as prescribed. WOUND #17: - Lower Leg Wound Laterality: Right, Midline, Posterior Prim Dressing: Silvercel Small 2x2 (in/in) 1 x Per Week/30 Days ary Discharge Instructions: Apply Silvercel Small 2x2 (in/in) as instructed Secondary Dressing: Zetuvit Plus 4x8 (in/in) 1 x Per Week/30 Days Secured With: [employer] Sterile or Non-Sterile 6-ply 4.5x4 (yd/yd) 1 x Per Week/30 Days Discharge  Instructions: Apply Kerlix as directed Com pression Wrap: 3-LAYER WRAP - Profore Lite LF 3 Multilayer Compression Bandaging System 1 x Per Week/30 Days Discharge Instructions: Apply 3 multi-layer wrap as prescribed. WOUND #18: - Lower Leg Wound Laterality: Right, Lateral, Posterior Prim Dressing: Silvercel Small 2x2 (in/in) 1 x Per Week/30 Days ary Discharge Instructions: Apply Silvercel Small 2x2 (in/in) as instructed Secondary Dressing: Zetuvit Plus 4x8 (in/in) 1 x Per Week/30 Days [name], [name] ([id number]) [phone].pdf Page 10 of 11 Secured With: [employer] or Non-Sterile 6-ply 4.5x4 (yd/yd) 1 x Per Week/30 Days Discharge Instructions: Apply Kerlix as directed Com pression Wrap: 3-LAYER WRAP - Profore Lite LF 3 Multilayer Compression Bandaging System 1 x Per Week/30 Days Discharge Instructions: Apply 3 multi-layer wrap as prescribed. WOUND #9: - Lower Leg Wound Laterality: Left, Lateral Prim Dressing: Silvercel Small 2x2 (in/in) 1 x Per Week/30 Days ary Discharge Instructions: Apply Silvercel Small 2x2 (in/in) as instructed Secondary Dressing: Zetuvit Plus 4x8 (in/in) 1 x Per Week/30 Days Secured With: [employer] Sterile or Non-Sterile 6-ply 4.5x4 (yd/yd) 1 x Per Week/30 Days Discharge Instructions: Apply Kerlix as directed Com pression Wrap: 3-LAYER WRAP - Profore Lite LF 3 Multilayer Compression Bandaging System 1 x Per Week/30 Days Discharge Instructions: Apply 3 multi-layer wrap as prescribed. 1. I am  going to suggest that we have the patient continue to monitor for any evidence of infection or worsening. Obviously if anything changes he can contact the office let me know. 2. Also can recommend that we continue with [name] silver alginate dressing will do this to all wound locations and subsequently cover this with [name] 3 layer compression wrap. We will see patient back for reevaluation in 1 week here in the clinic. If anything worsens or changes  patient will contact our office for additional recommendations. Electronic Signature(s) Signed: 09/10/2022 10:20:01 AM By: [name] Keeler PA-C Entered By: [name] on 09/10/2022 10:20:01 -------------------------------------------------------------------------------- SuperBill Details Patient Name: Date of Service: [name], [name] 09/10/2022 Medical Record Number: [id number] Patient Account Number: [id number] Date of Birth/Sex: Treating RN: 1945-12-18 (76 y.o. [name], [name] Primary Care Provider: Lelon [name] Other Clinician: Referring Provider: Treating Provider/Extender: [name] in Treatment: 5 Diagnosis Coding ICD-10 Codes Code Description [phone] Chronic venous hypertension (idiopathic) with ulcer and inflammation of bilateral lower extremity L97.822 Non-pressure chronic ulcer of other part of left lower leg with fat layer exposed L97.812 Non-pressure chronic ulcer of other part of right lower leg with fat layer exposed I73.89 Other specified peripheral vascular diseases I48.0 Paroxysmal atrial fibrillation I10 Essential (primary) hypertension Facility Procedures : CPT4: Code [id number] foot Description: 1 BILATERAL: Application of multi-layer venous compression system; leg (below knee), including ankle and . ICD-10 Diagnosis Description I87.333 Chronic venous hypertension (idiopathic) with ulcer and inflammation of bilateral lower extre  L97.822 Non-pressure chronic ulcer of other part of left lower leg with fat layer exposed L97.812 Non-pressure chronic ulcer of other part of right lower leg with fat layer exposed Modifier: mity Quantity: 1 Physician Procedures : CPT4 Code Description Modifier [id number] - WC PHYS LEVEL 3 - EST PT [name], [name] ([id number]) [id number] Page Quantity: 1 11 of 11 : ICD-10 Diagnosis Description I87.333 Chronic venous hypertension (idiopathic) with ulcer and inflammation of  bilateral lower extremity L97.822 Non-pressure chronic ulcer of other part of left lower leg with fat layer exposed L97.812 Non-pressure  chronic ulcer of other part of right lower leg with fat layer exposed I73.89 Other specified peripheral vascular diseases Quantity: Electronic Signature(s) Signed: 09/10/2022 10:20:38 AM By: [name] Keeler PA-C Entered By: [name] on 09/10/2022 10:20:37

## 2022-09-10 NOTE — Progress Notes (Addendum)
ABI: 0.7 Post Procedure Diagnosis Same as Pre-procedure Electronic Signature(s) Signed: 09/10/2022 2:54:45 PM By: Gretta Cool, BSN, RN, CWS, Kim RN, BSN Entered By: Gretta Cool, BSN, RN, CWS, Kim on 09/10/2022 09:48:31 -------------------------------------------------------------------------------- Compression Therapy Details Patient Name: Date of Service: [name], [name] LBERT R. 09/10/2022 8:45 [name] [name] Medical Record Number: [id number] Patient Account Number: [id number] Date of Birth/Sex: Treating RN: [birth year] (76 y.o. [name] Care Kotaro Buer: [name] Other Clinician: Referring [name]: Treating [name]/Extender: [name] in Treatment: 5 Compression Therapy Performed for Wound Assessment: Wound #9 Left,Lateral Lower Leg Performed By: Clinician [name] Barman, RN Compression Type: Three Layer Pre Treatment ABI: 0.7 Post Procedure Diagnosis Same as Pre-procedure Electronic Signature(s) Signed: 09/10/2022 2:54:45 PM By: Gretta Cool, BSN, RN, CWS, Kim RN, BSN Entered By: Gretta Cool, BSN, RN, CWS, Kim on 09/10/2022 09:48:31 [name] ([id number]) [id number] Page 4 of  16 -------------------------------------------------------------------------------- Encounter Discharge Information Details Patient Name: Date of Service: [name] 09/10/2022 8:45 [name] [name] Medical Record Number: [id number] Patient Account Number: [id number] Date of Birth/Sex: Treating RN: [birth year] (75 y.o. [name] Care Etan Vasudevan: [name] Other Clinician: Referring Douglass Dunshee: Treating [name]/Extender: [name] in Treatment: 5 Encounter Discharge Information Items Discharge Condition: Stable Ambulatory Status: Ambulatory Discharge Destination: Home Transportation: Private Auto Accompanied By: sisters Schedule Follow-up Appointment: Yes Clinical Summary of Care: Electronic Signature(s) Signed: 09/10/2022 2:54:45 PM By: Gretta Cool, BSN, RN, CWS, Kim RN, BSN Entered By: Gretta Cool, BSN, RN, CWS, Kim on 09/10/2022 09:53:46 -------------------------------------------------------------------------------- Lower Extremity Assessment Details Patient Name: Date of Service: [name], [name] LBERT R. 09/10/2022 8:45 [name] [name] Medical Record Number: [id number] Patient Account Number: [id number] Date of Birth/Sex: Treating RN: [birth year] (76 y.o. [name] Care Mercy Leppla: [name] Other Clinician: Referring Aws Shere: Treating Kru Allman/Extender: [name] in Treatment: 5 Edema Assessment Assessed: [name]: No] Patrice Paradise: No] [Left: Edema] [Right: :] Calf Left: Right: Point of Measurement: 32 cm From Medial Instep 36 cm 34.5 cm Ankle Left: Right: Point of Measurement: 12 cm From Medial Instep 23.5 cm 23.5 cm Vascular Assessment Pulses: [name], [name] ([id number]) Dorsalis Pedis Palpable: [Left:Yes] [Right:Yes] Electronic Signature(s) Signed: 09/10/2022 2:54:45 PM By: Gretta Cool, BSN, RN, CWS, Kim RN, BSN Entered By: Gretta Cool, BSN, RN, CWS, Kim on 09/10/2022  09:20:48 -------------------------------------------------------------------------------- Multi Wound Chart Details Patient Name: Date of Service: [name], [name] LBERT R. 09/10/2022 8:45 [name] [name] Medical Record Number: [id number] Patient Account Number: [id number] Date of Birth/Sex: Treating RN: August 12, 1946 (76 y.o. [name], [name] Care Dedra Matsuo: [name] Other Clinician: Referring [name]: Treating [name]/Extender: [name] in Treatment: 5 Vital Signs Height(in): 75 Pulse(bpm): 60 Weight(lbs): 225 Blood Pressure(mmHg): 123/62 Body Mass Index(BMI): 28.1 Temperature(F): 98.2 Respiratory Rate(breaths/min): 16 [15:Photos:] Left, Posterior Lower Leg Right, Medial, Anterior Lower Leg Right, Midline, Posterior Lower Leg Wound Location: Gradually Appeared Gradually Appeared Gradually Appeared Wounding Event: Lesion Lesion Lesion [name] Etiology: 08/28/2022 08/28/2022 09/10/2022 Date Acquired: 0 0 0 Weeks of Treatment: Open Open Open Wound Status: No No No Wound Recurrence: 0.7x0.5x0.1 0.6x0.4x0.1 0.2x0.3x0.1 Measurements L x W x D (cm) 0.275 0.188 0.047 [name] (cm) : rea 0.027 0.019 0.005 Volume (cm) : 90.30% -49.20% [name] % Reduction in [name] rea: 3.60% -46.20% [name] % Reduction in Volume: Unclassifiable Unclassifiable [name] Classification: Medium Medium [name] Exudate [name] mount: Sanguinous Sanguinous [name] Exudate Type: red red [name] Exudate Color: Wound Number: [id number] [name] Photos: [name] Right, Lateral, Posterior Lower Leg Left, Lateral  Lower Leg [name] Wound Location: SEDDRICK, [name] ([id number]) [id number] Page 6 of 16 Gradually Appeared Gradually Appeared [name] Wounding Event: Lesion Malignant Wound [name] Etiology: 09/10/2022 01/20/2022 [name] Date Acquired: 0 5 [name] Weeks of Treatment: Open Open [name] Wound Status: No No [name] Wound Recurrence: 0.6x0.3x0.1 3.9x2.2x0.1 [name] Measurements L x W x D (cm) 0.141 6.739 [name] [name] (cm)  : rea 0.014 0.674 [name] Volume (cm) : [name] 61.60% [name] % Reduction in [name] rea: [name] 80.80% [name] % Reduction in Volume: [name] Full Thickness Without Exposed [name] Classification: Support Structures [name] Large [name] Exudate Amount: [name] Serosanguineous [name] Exudate Type: [name] red, brown [name] Exudate Color: Treatment Notes Electronic Signature(s) Signed: 09/10/2022 2:54:45 PM By: Gretta Cool, BSN, RN, CWS, Kim RN, BSN Entered By: Gretta Cool, BSN, RN, CWS, Kim on 09/10/2022 09:29:04 -------------------------------------------------------------------------------- Multi-Disciplinary Care Plan Details Patient Name: Date of Service: [name], [name] LBERT R. 09/10/2022 8:45 [name] [name] Medical Record Number: [id number] Patient Account Number: [id number] Date of Birth/Sex: Treating RN: [birth year] (76 y.o. [name] Care Unita Detamore: [name] Other Clinician: Referring Ticia Virgo: Treating Jossalin Chervenak/Extender: [name] in Treatment: 5 Active Inactive Necrotic Tissue Nursing Diagnoses: Impaired tissue integrity related to necrotic/devitalized tissue Knowledge deficit related to management of necrotic/devitalized tissue Goals: Necrotic/devitalized tissue will be minimized in the wound bed Date Initiated: 09/04/2022 Target Resolution Date: 09/27/2022 Goal Status: Active Patient/caregiver will verbalize understanding of reason and process for debridement of necrotic tissue Date Initiated: 09/04/2022 Date Inactivated: 09/10/2022 Target Resolution Date: 09/27/2022 Goal Status: Met Interventions: Assess patient pain level pre-, during and post procedure and prior to discharge Provide education on necrotic tissue and debridement process Treatment Activities: Apply topical anesthetic as ordered : 09/04/2022 Excisional debridement : 09/04/2022 Notes: Venous Leg Ulcer ELOHIM, BRUNE R ([id number]) [id number] Page 7 of 16 Nursing Diagnoses: Actual venous Insuffiency  (use after diagnosis is confirmed) Knowledge deficit related to disease process and management Potential for venous Insuffiency (use before diagnosis confirmed) Goals: Non-invasive venous studies are completed as ordered Date Initiated: 09/04/2022 Target Resolution Date: 09/03/2022 Goal Status: Active Patient will maintain optimal edema control Date Initiated: 09/04/2022 Date Inactivated: 09/10/2022 Target Resolution Date: 10/04/2022 Goal Status: Met Patient/caregiver will verbalize understanding of disease process and disease management Date Initiated: 09/04/2022 Date Inactivated: 09/10/2022 Target Resolution Date: 10/04/2022 Goal Status: Met Verify adequate tissue perfusion prior to therapeutic compression application Date Initiated: 09/04/2022 Date Inactivated: 09/10/2022 Target Resolution Date: 10/04/2022 Goal Status: Met Interventions: Assess peripheral edema status every visit. Compression as ordered Provide education on venous insufficiency Treatment Activities: Therapeutic compression applied : 09/04/2022 Notes: Wound/Skin Impairment Nursing Diagnoses: Impaired tissue integrity Knowledge deficit related to smoking impact on wound healing Knowledge deficit related to ulceration/compromised skin integrity Goals: Patient/caregiver will verbalize understanding of skin care regimen Date Initiated: 09/04/2022 Date Inactivated: 09/10/2022 Target Resolution Date: 10/04/2022 Goal Status: Met Ulcer/skin breakdown will have [name] volume reduction of 30% by week 4 Date Initiated: 09/04/2022 Date Inactivated: 09/10/2022 Target Resolution Date: 10/04/2022 Goal Status: Unmet Unmet Reason: comorbidities Ulcer/skin breakdown will have [name] volume reduction of 50% by week 8 Date Initiated: 09/04/2022 Target Resolution Date: 11/04/2022 Goal Status: Active Ulcer/skin breakdown will have [name] volume reduction of 80% by week 12 Date Initiated: 09/04/2022 Target Resolution Date: 12/05/2022 Goal  Status: Active Ulcer/skin breakdown will heal within 14 weeks Date Initiated: 09/04/2022 Target Resolution Date: 12/12/2022 Goal Status: Active Interventions: Assess patient/caregiver ability to obtain necessary supplies Assess patient/caregiver ability to perform ulcer/skin care regimen upon admission and as needed Assess  [id number] Patient Account Number: [id number] Date of Birth/Sex: Treating RN: [birth year] (76 y.o. [name] Care Rhianon Zabawa: [name] Other Clinician: Referring Angeleigh Chiasson: Treating Shequila Neglia/Extender: [name] in Treatment: 5 Vital Signs Time Taken: 08:52 Temperature (F): 98.2 Height (in): 75 Pulse (bpm): 60 Weight (lbs): 225 Respiratory Rate (breaths/min): 16 Body Mass Index (BMI): 28.1 Blood Pressure (mmHg): 123/62 Reference Range: 80 - 120 mg / dl Electronic Signature(s) Signed: 09/10/2022 2:54:45 PM By: Gretta Cool, BSN, RN, CWS, Kim RN, BSN Entered By: Gretta Cool, BSN, RN, CWS, Kim on 09/10/2022 08:53:01  [id number] Patient Account Number: [id number] Date of Birth/Sex: Treating RN: [birth year] (76 y.o. [name] Care Rhianon Zabawa: [name] Other Clinician: Referring Angeleigh Chiasson: Treating Shequila Neglia/Extender: [name] in Treatment: 5 Vital Signs Time Taken: 08:52 Temperature (F): 98.2 Height (in): 75 Pulse (bpm): 60 Weight (lbs): 225 Respiratory Rate (breaths/min): 16 Body Mass Index (BMI): 28.1 Blood Pressure (mmHg): 123/62 Reference Range: 80 - 120 mg / dl Electronic Signature(s) Signed: 09/10/2022 2:54:45 PM By: Gretta Cool, BSN, RN, CWS, Kim RN, BSN Entered By: Gretta Cool, BSN, RN, CWS, Kim on 09/10/2022 08:53:01  ABI: 0.7 Post Procedure Diagnosis Same as Pre-procedure Electronic Signature(s) Signed: 09/10/2022 2:54:45 PM By: Gretta Cool, BSN, RN, CWS, Kim RN, BSN Entered By: Gretta Cool, BSN, RN, CWS, Kim on 09/10/2022 09:48:31 -------------------------------------------------------------------------------- Compression Therapy Details Patient Name: Date of Service: [name], [name] LBERT R. 09/10/2022 8:45 [name] [name] Medical Record Number: [id number] Patient Account Number: [id number] Date of Birth/Sex: Treating RN: [birth year] (76 y.o. [name] Care Kotaro Buer: [name] Other Clinician: Referring [name]: Treating [name]/Extender: [name] in Treatment: 5 Compression Therapy Performed for Wound Assessment: Wound #9 Left,Lateral Lower Leg Performed By: Clinician [name] Barman, RN Compression Type: Three Layer Pre Treatment ABI: 0.7 Post Procedure Diagnosis Same as Pre-procedure Electronic Signature(s) Signed: 09/10/2022 2:54:45 PM By: Gretta Cool, BSN, RN, CWS, Kim RN, BSN Entered By: Gretta Cool, BSN, RN, CWS, Kim on 09/10/2022 09:48:31 [name] ([id number]) [id number] Page 4 of  16 -------------------------------------------------------------------------------- Encounter Discharge Information Details Patient Name: Date of Service: [name] 09/10/2022 8:45 [name] [name] Medical Record Number: [id number] Patient Account Number: [id number] Date of Birth/Sex: Treating RN: [birth year] (75 y.o. [name] Care Etan Vasudevan: [name] Other Clinician: Referring Douglass Dunshee: Treating [name]/Extender: [name] in Treatment: 5 Encounter Discharge Information Items Discharge Condition: Stable Ambulatory Status: Ambulatory Discharge Destination: Home Transportation: Private Auto Accompanied By: sisters Schedule Follow-up Appointment: Yes Clinical Summary of Care: Electronic Signature(s) Signed: 09/10/2022 2:54:45 PM By: Gretta Cool, BSN, RN, CWS, Kim RN, BSN Entered By: Gretta Cool, BSN, RN, CWS, Kim on 09/10/2022 09:53:46 -------------------------------------------------------------------------------- Lower Extremity Assessment Details Patient Name: Date of Service: [name], [name] LBERT R. 09/10/2022 8:45 [name] [name] Medical Record Number: [id number] Patient Account Number: [id number] Date of Birth/Sex: Treating RN: [birth year] (76 y.o. [name] Care Mercy Leppla: [name] Other Clinician: Referring Aws Shere: Treating Kru Allman/Extender: [name] in Treatment: 5 Edema Assessment Assessed: [name]: No] Patrice Paradise: No] [Left: Edema] [Right: :] Calf Left: Right: Point of Measurement: 32 cm From Medial Instep 36 cm 34.5 cm Ankle Left: Right: Point of Measurement: 12 cm From Medial Instep 23.5 cm 23.5 cm Vascular Assessment Pulses: [name], [name] ([id number]) Dorsalis Pedis Palpable: [Left:Yes] [Right:Yes] Electronic Signature(s) Signed: 09/10/2022 2:54:45 PM By: Gretta Cool, BSN, RN, CWS, Kim RN, BSN Entered By: Gretta Cool, BSN, RN, CWS, Kim on 09/10/2022  09:20:48 -------------------------------------------------------------------------------- Multi Wound Chart Details Patient Name: Date of Service: [name], [name] LBERT R. 09/10/2022 8:45 [name] [name] Medical Record Number: [id number] Patient Account Number: [id number] Date of Birth/Sex: Treating RN: August 12, 1946 (76 y.o. [name], [name] Care Dedra Matsuo: [name] Other Clinician: Referring [name]: Treating [name]/Extender: [name] in Treatment: 5 Vital Signs Height(in): 75 Pulse(bpm): 60 Weight(lbs): 225 Blood Pressure(mmHg): 123/62 Body Mass Index(BMI): 28.1 Temperature(F): 98.2 Respiratory Rate(breaths/min): 16 [15:Photos:] Left, Posterior Lower Leg Right, Medial, Anterior Lower Leg Right, Midline, Posterior Lower Leg Wound Location: Gradually Appeared Gradually Appeared Gradually Appeared Wounding Event: Lesion Lesion Lesion [name] Etiology: 08/28/2022 08/28/2022 09/10/2022 Date Acquired: 0 0 0 Weeks of Treatment: Open Open Open Wound Status: No No No Wound Recurrence: 0.7x0.5x0.1 0.6x0.4x0.1 0.2x0.3x0.1 Measurements L x W x D (cm) 0.275 0.188 0.047 [name] (cm) : rea 0.027 0.019 0.005 Volume (cm) : 90.30% -49.20% [name] % Reduction in [name] rea: 3.60% -46.20% [name] % Reduction in Volume: Unclassifiable Unclassifiable [name] Classification: Medium Medium [name] Exudate [name] mount: Sanguinous Sanguinous [name] Exudate Type: red red [name] Exudate Color: Wound Number: [id number] [name] Photos: [name] Right, Lateral, Posterior Lower Leg Left, Lateral  ABI: 0.7 Post Procedure Diagnosis Same as Pre-procedure Electronic Signature(s) Signed: 09/10/2022 2:54:45 PM By: Gretta Cool, BSN, RN, CWS, Kim RN, BSN Entered By: Gretta Cool, BSN, RN, CWS, Kim on 09/10/2022 09:48:31 -------------------------------------------------------------------------------- Compression Therapy Details Patient Name: Date of Service: [name], [name] LBERT R. 09/10/2022 8:45 [name] [name] Medical Record Number: [id number] Patient Account Number: [id number] Date of Birth/Sex: Treating RN: [birth year] (76 y.o. [name] Care Kotaro Buer: [name] Other Clinician: Referring [name]: Treating [name]/Extender: [name] in Treatment: 5 Compression Therapy Performed for Wound Assessment: Wound #9 Left,Lateral Lower Leg Performed By: Clinician [name] Barman, RN Compression Type: Three Layer Pre Treatment ABI: 0.7 Post Procedure Diagnosis Same as Pre-procedure Electronic Signature(s) Signed: 09/10/2022 2:54:45 PM By: Gretta Cool, BSN, RN, CWS, Kim RN, BSN Entered By: Gretta Cool, BSN, RN, CWS, Kim on 09/10/2022 09:48:31 [name] ([id number]) [id number] Page 4 of  16 -------------------------------------------------------------------------------- Encounter Discharge Information Details Patient Name: Date of Service: [name] 09/10/2022 8:45 [name] [name] Medical Record Number: [id number] Patient Account Number: [id number] Date of Birth/Sex: Treating RN: [birth year] (75 y.o. [name] Care Etan Vasudevan: [name] Other Clinician: Referring Douglass Dunshee: Treating [name]/Extender: [name] in Treatment: 5 Encounter Discharge Information Items Discharge Condition: Stable Ambulatory Status: Ambulatory Discharge Destination: Home Transportation: Private Auto Accompanied By: sisters Schedule Follow-up Appointment: Yes Clinical Summary of Care: Electronic Signature(s) Signed: 09/10/2022 2:54:45 PM By: Gretta Cool, BSN, RN, CWS, Kim RN, BSN Entered By: Gretta Cool, BSN, RN, CWS, Kim on 09/10/2022 09:53:46 -------------------------------------------------------------------------------- Lower Extremity Assessment Details Patient Name: Date of Service: [name], [name] LBERT R. 09/10/2022 8:45 [name] [name] Medical Record Number: [id number] Patient Account Number: [id number] Date of Birth/Sex: Treating RN: [birth year] (76 y.o. [name] Care Mercy Leppla: [name] Other Clinician: Referring Aws Shere: Treating Kru Allman/Extender: [name] in Treatment: 5 Edema Assessment Assessed: [name]: No] Patrice Paradise: No] [Left: Edema] [Right: :] Calf Left: Right: Point of Measurement: 32 cm From Medial Instep 36 cm 34.5 cm Ankle Left: Right: Point of Measurement: 12 cm From Medial Instep 23.5 cm 23.5 cm Vascular Assessment Pulses: [name], [name] ([id number]) Dorsalis Pedis Palpable: [Left:Yes] [Right:Yes] Electronic Signature(s) Signed: 09/10/2022 2:54:45 PM By: Gretta Cool, BSN, RN, CWS, Kim RN, BSN Entered By: Gretta Cool, BSN, RN, CWS, Kim on 09/10/2022  09:20:48 -------------------------------------------------------------------------------- Multi Wound Chart Details Patient Name: Date of Service: [name], [name] LBERT R. 09/10/2022 8:45 [name] [name] Medical Record Number: [id number] Patient Account Number: [id number] Date of Birth/Sex: Treating RN: August 12, 1946 (76 y.o. [name], [name] Care Dedra Matsuo: [name] Other Clinician: Referring [name]: Treating [name]/Extender: [name] in Treatment: 5 Vital Signs Height(in): 75 Pulse(bpm): 60 Weight(lbs): 225 Blood Pressure(mmHg): 123/62 Body Mass Index(BMI): 28.1 Temperature(F): 98.2 Respiratory Rate(breaths/min): 16 [15:Photos:] Left, Posterior Lower Leg Right, Medial, Anterior Lower Leg Right, Midline, Posterior Lower Leg Wound Location: Gradually Appeared Gradually Appeared Gradually Appeared Wounding Event: Lesion Lesion Lesion [name] Etiology: 08/28/2022 08/28/2022 09/10/2022 Date Acquired: 0 0 0 Weeks of Treatment: Open Open Open Wound Status: No No No Wound Recurrence: 0.7x0.5x0.1 0.6x0.4x0.1 0.2x0.3x0.1 Measurements L x W x D (cm) 0.275 0.188 0.047 [name] (cm) : rea 0.027 0.019 0.005 Volume (cm) : 90.30% -49.20% [name] % Reduction in [name] rea: 3.60% -46.20% [name] % Reduction in Volume: Unclassifiable Unclassifiable [name] Classification: Medium Medium [name] Exudate [name] mount: Sanguinous Sanguinous [name] Exudate Type: red red [name] Exudate Color: Wound Number: [id number] [name] Photos: [name] Right, Lateral, Posterior Lower Leg Left, Lateral  [name], [name] Care Korey Prashad: [name] Other Clinician: Referring Suly Vukelich: Treating Lesley Atkin/Extender: [name] in Treatment: 5 Wound Status Wound Number: [id number] [name] Etiology: Venous Leg Ulcer Wound Location: Right, Lateral, Posterior Lower Leg Wound Status: Open Wounding Event: Gradually Appeared Comorbid History: Arrhythmia, Hypertension, Gout Date Acquired: 09/10/2022 Weeks Of Treatment: 0 Clustered Wound: No Photos Wound Measurements Length: (cm) 0.6 Width: (cm) 0.3 Depth: (cm) 0.1 Area: (cm) 0.141 Langseth, Kit R ([id number]) Volume: (cm) 0.014 % Reduction in Area: % Reduction in Volume: [id number] Page 14 of 16 Wound Description Classification: Full Thickness Without Exposed Suppor Exudate Amount: Medium Exudate Type: Serous Exudate Color: amber t Structures Foul Odor After Cleansing: No Slough/Fibrino Yes Wound Bed Granulation Amount: Large (67-100%) Exposed Structure Granulation Quality: Red Fat Layer (Subcutaneous Tissue) Exposed: Yes Necrotic Amount: None Present (0%) Treatment Notes Wound #18 (Lower Leg) Wound Laterality: Right, Lateral, Posterior Cleanser Peri-Wound Care Topical [name] Dressing Silvercel Small 2x2 (in/in) Discharge Instruction: Apply Silvercel Small 2x2 (in/in) as instructed Secondary Dressing Zetuvit Plus 4x8 (in/in) Secured With [employer] Sterile or Non-Sterile 6-ply 4.5x4 (yd/yd) Discharge Instruction: Apply Kerlix as directed Compression Wrap 3-LAYER WRAP - Profore Lite LF 3 Multilayer Compression Bandaging System Discharge Instruction: Apply 3 multi-layer wrap as prescribed. Compression Stockings Environmental education officer) Signed: 09/10/2022 2:46:21 PM By: Gretta Cool, BSN, RN, CWS, Kim RN, BSN Entered By: Gretta Cool, BSN, RN, CWS, Kim on 09/10/2022 14:46:21 -------------------------------------------------------------------------------- Wound  Assessment Details Patient Name: Date of Service: [name] Harlan, [name] LBERT R. 09/10/2022 8:45 [name] [name] Medical Record Number: [id number] Patient Account Number: [id number] Date of Birth/Sex: Treating RN: 1946/02/16 (76 y.o. [name] Care Angeligue Bowne: [name] Other Clinician: Referring Kaisyn Reinhold: Treating Aya Geisel/Extender: [name] in Treatment: 5 Wound Status Wound Number: 9 [name] Etiology: Venous Leg Ulcer Wound Location: Left, Lateral Lower Leg Wound Status: Open Wounding Event: Gradually Appeared Comorbid History: Arrhythmia, Hypertension, Gout Date Acquired: 01/20/2022 Weeks Of Treatment: 5 Clustered Wound: No LAMBERT, JEANTY R ([id number]) [phone].pdf Page 15 of 16 Photos Wound Measurements Length: (cm) 3.9 Width: (cm) 2.2 Depth: (cm) 0.1 Area: (cm) 6.739 Volume: (cm) 0.674 % Reduction in Area: 61.6% % Reduction in Volume: 80.8% Epithelialization: Medium (34-66%) Wound Description Classification: Full Thickness Without Exposed Suppor Wound Margin: Thickened Exudate Amount: Large Exudate Type: Serosanguineous Exudate Color: red, brown t Structures Foul Odor After Cleansing: No Slough/Fibrino Yes Wound Bed Granulation Amount: Medium (34-66%) Exposed Structure Granulation Quality: Red, Hyper-granulation, Friable Fascia Exposed: No Necrotic Amount: Medium (34-66%) Fat Layer (Subcutaneous Tissue) Exposed: Yes Necrotic Quality: Adherent Slough Tendon Exposed: No Muscle Exposed: No Joint Exposed: No Bone Exposed: No Treatment Notes Wound #9 (Lower Leg) Wound Laterality: Left, Lateral Cleanser Peri-Wound Care Topical [name] Dressing Silvercel Small 2x2 (in/in) Discharge Instruction: Apply Silvercel Small 2x2 (in/in) as instructed Secondary Dressing Zetuvit Plus 4x8 (in/in) Secured With [employer] Sterile or Non-Sterile 6-ply 4.5x4 (yd/yd) Discharge Instruction: Apply Kerlix as directed Compression  Wrap 3-LAYER WRAP - Profore Lite LF 3 Multilayer Compression Bandaging System Discharge Instruction: Apply 3 multi-layer wrap as prescribed. Compression Stockings Environmental education officer) Signed: 09/10/2022 2:47:03 PM By: Gretta Cool, BSN, RN, CWS, Kim RN, BSN Entered By: Gretta Cool, BSN, RN, CWS, Kim on 09/10/2022 [place], [place] ([id number]) [id number] Page 16 of 16 -------------------------------------------------------------------------------- Vitals Details Patient Name: Date of Service: [name] [place], [name] LBERT R. 09/10/2022 8:45 [name] [name] Medical Record Number:

## 2022-09-11 ENCOUNTER — Ambulatory Visit (HOSPITAL_COMMUNITY): Payer: Medicare PPO | Attending: Cardiovascular Disease

## 2022-09-11 DIAGNOSIS — I5033 Acute on chronic diastolic (congestive) heart failure: Secondary | ICD-10-CM | POA: Diagnosis not present

## 2022-09-11 HISTORY — PX: TRANSTHORACIC ECHOCARDIOGRAM: SHX275

## 2022-09-11 LAB — ECHOCARDIOGRAM COMPLETE
Area-P 1/2: 3.25 cm2
S' Lateral: 3.5 cm

## 2022-09-13 ENCOUNTER — Other Ambulatory Visit: Payer: Self-pay | Admitting: Physician Assistant

## 2022-09-13 ENCOUNTER — Other Ambulatory Visit: Payer: Self-pay

## 2022-09-13 ENCOUNTER — Encounter: Payer: Self-pay | Admitting: Physician Assistant

## 2022-09-13 DIAGNOSIS — I5033 Acute on chronic diastolic (congestive) heart failure: Secondary | ICD-10-CM

## 2022-09-13 DIAGNOSIS — R7989 Other specified abnormal findings of blood chemistry: Secondary | ICD-10-CM

## 2022-09-13 DIAGNOSIS — I739 Peripheral vascular disease, unspecified: Secondary | ICD-10-CM

## 2022-09-13 NOTE — Addendum Note (Signed)
Addended by: Bernestine Amass on: 09/13/2022 01:55 PM   Modules accepted: Orders

## 2022-09-14 ENCOUNTER — Ambulatory Visit (HOSPITAL_COMMUNITY)
Admission: RE | Admit: 2022-09-14 | Discharge: 2022-09-14 | Disposition: A | Discharge status: Home / Self Care | Payer: Medicare PPO | Source: Ambulatory Visit | Attending: Physician Assistant | Admitting: Physician Assistant

## 2022-09-14 ENCOUNTER — Ambulatory Visit (HOSPITAL_BASED_OUTPATIENT_CLINIC_OR_DEPARTMENT_OTHER)
Admission: RE | Admit: 2022-09-14 | Discharge: 2022-09-14 | Disposition: A | Discharge status: Home / Self Care | Payer: Medicare PPO | Source: Ambulatory Visit | Attending: Physician Assistant | Admitting: Physician Assistant

## 2022-09-14 DIAGNOSIS — I779 Disorder of arteries and arterioles, unspecified: Secondary | ICD-10-CM

## 2022-09-14 DIAGNOSIS — I739 Peripheral vascular disease, unspecified: Secondary | ICD-10-CM | POA: Diagnosis not present

## 2022-09-17 ENCOUNTER — Encounter: Payer: Medicare PPO | Admitting: Physician Assistant

## 2022-09-17 ENCOUNTER — Other Ambulatory Visit: Payer: Self-pay | Admitting: Physician Assistant

## 2022-09-17 DIAGNOSIS — I10 Essential (primary) hypertension: Secondary | ICD-10-CM | POA: Diagnosis not present

## 2022-09-17 DIAGNOSIS — I739 Peripheral vascular disease, unspecified: Secondary | ICD-10-CM | POA: Diagnosis not present

## 2022-09-17 DIAGNOSIS — L97222 Non-pressure chronic ulcer of left calf with fat layer exposed: Secondary | ICD-10-CM | POA: Diagnosis not present

## 2022-09-17 DIAGNOSIS — I872 Venous insufficiency (chronic) (peripheral): Secondary | ICD-10-CM | POA: Diagnosis not present

## 2022-09-17 DIAGNOSIS — I87332 Chronic venous hypertension (idiopathic) with ulcer and inflammation of left lower extremity: Secondary | ICD-10-CM | POA: Diagnosis not present

## 2022-09-17 DIAGNOSIS — I87333 Chronic venous hypertension (idiopathic) with ulcer and inflammation of bilateral lower extremity: Secondary | ICD-10-CM | POA: Diagnosis not present

## 2022-09-17 DIAGNOSIS — Z7901 Long term (current) use of anticoagulants: Secondary | ICD-10-CM | POA: Diagnosis not present

## 2022-09-17 DIAGNOSIS — I480 Paroxysmal atrial fibrillation: Secondary | ICD-10-CM | POA: Diagnosis not present

## 2022-09-17 DIAGNOSIS — L97812 Non-pressure chronic ulcer of other part of right lower leg with fat layer exposed: Secondary | ICD-10-CM | POA: Diagnosis not present

## 2022-09-17 DIAGNOSIS — L97822 Non-pressure chronic ulcer of other part of left lower leg with fat layer exposed: Secondary | ICD-10-CM | POA: Diagnosis not present

## 2022-09-17 DIAGNOSIS — I428 Other cardiomyopathies: Secondary | ICD-10-CM | POA: Diagnosis not present

## 2022-09-17 NOTE — Progress Notes (Addendum)
XAYNE, BRUMBAUGH R ([id number]) [id number] Page 1 of 11 Visit Report for 09/17/2022 Biopsy Details Patient Name: Date of Service: [name] 09/17/2022 9:45 A M Medical Record Number: [id number] Patient Account Number: [id number] Date of Birth/Sex: Treating RN: 07/07/1946 (76 y.o. Jerilynn Mages) Carlene Coria Primary Care Provider: [name] Other Clinician: Massie Kluver Referring Provider: Treating Provider/Extender: [name] in Treatment: 6 Biopsy Performed for: Wound #15 Left, Posterior Lower Leg Location(s): Wound Margin Performed By: Physician Tommie Sams., PA-C Tissue Punch: No Number of Specimens T aken: 2 Specimen Sent T Pathology: o Yes Level of Consciousness (Pre-procedure): Awake and Alert Pre-procedure Verification/Time-Out Taken: No Pain Control: Lidocaine 5% topical ointment Instrument: Blade, Forceps, Scissors Bleeding: Moderate Hemostasis Achieved: Silver Nitrate Response to Treatment: Procedure was tolerated well Level of Consciousness (Post-procedure): Awake and Alert Post Procedure Diagnosis Same as Pre-procedure Electronic Signature(s) Signed: 09/17/2022 1:22:59 PM By: [name] PA-C Signed: 09/25/2022 4:45:28 PM By: Massie Kluver Entered By: Massie Kluver on 09/17/2022 10:44:07 -------------------------------------------------------------------------------- Chief Complaint Document Details Patient Name: Date of Service: MO [name], [name] 09/17/2022 9:45 A M Medical Record Number: [id number] Patient Account Number: [id number] Date of Birth/Sex: Treating RN: Jun 14, 1946 (76 y.o. Jerilynn Mages) Carlene Coria Primary Care Provider: [name] Other Clinician: Massie Kluver Referring Provider: Treating Provider/Extender: [name] in Treatment: 6 Information Obtained from: Patient Chief Complaint Bilateral LE Ulcers YADIER, BRAMHALL ([id number])  [id number] Page 2 of 11 Electronic Signature(s) Signed: 09/17/2022 10:33:41 AM By: [name] PA-C Entered By: [name] on 09/17/2022 10:33:41 -------------------------------------------------------------------------------- HPI Details Patient Name: Date of Service: [name], [name] 09/17/2022 9:45 A M Medical Record Number: [id number] Patient Account Number: [id number] Date of Birth/Sex: Treating RN: 08/05/1946 (76 y.o. Jerilynn Mages) Carlene Coria Primary Care Provider: [name] Other Clinician: Massie Kluver Referring Provider: Treating Provider/Extender: [name] in Treatment: 6 History of Present Illness HPI Description: 76 year old male who has a past medical history of essential hypertension, chronic atrial fibrillation, peripheral vascular disease, nonischemic cardiomyopathy,venous stasis dermatitis, gouty arthropathy, basal cell carcinoma of the right lower extremity, benign prostatic hypertrophy, long- term use of anticoagulation therapy, hyperglycemia and exercise intolerance has never been a smoker. the patient has had a vascular workup over 7 years ago and said everything was normal at that stage. He does not have any chronic problems except for cardiac issues which he sees a cardiologist in [place]. 08/15/2017 -- arterial and venous duplex studies still pending. 08/23/2017 -- venous reflux studies done on 08/13/2017 shows venous incompetence throughout the left lower extremity deep system and focally at the left saphenofemoral junction. 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 No evidence of SVT or DVT in bilateral lower extremities The patient has an appointment at the end of the month to get his arterial duplex study done 09/05/2017 -- the patient was seen at the vein and vascular office yesterday by Angelena Form. ABI studies were notable for medial calcification and the toe brachial  indices were normal and bilateral ankle-brachial) waveforms were normal with triphasic flow. After review of his venous studies he was not a candidate for laser ablation and his lymphedema was to be treated with compression stockings and lymphedema pump pumps 09/12/2017 -- had a low arterial study done at the [facility] -- unable to obtain reliable ABI is due to medial calcification. Bilateral toe indices were normal with the right being 1.01  and the left being 0.92 and the waveforms were triphasic bilaterally. he did get hold of 30-40 mm compression stockings but is unable to put these on. We will try and get him alternative compression stockings. 09/26/17- he is here in follow up evaluation of a right lower extremity ulcer;he is compliant in wearing compression stocking; ulcer almost epithelialized , anticipate healing next appointment Readmission: 11/17 point upon evaluation patient's wound currently that he is seeing [place] for today is a skin cancerous lesion that was cleared away by his dermatologist on the left medial calf region. He tells me that this is a very similar thing to what he had done previously in fact the last time he saw him in 2018 this was also what was going on at that point. Nonetheless he feels that based on what he seeing currently that this is just having a lot of harder time healing although it is much closer to the surface than what he is experienced in the past. He notes that the initial removal was in June 2022 which was this year this is now November and still has not closed. He does have some edema and definitely I think that there is some venous component to his slow healing here. Also think that we can do something better than Vaseline to try to help with getting this to clear up as quickly as possible. He does have a history of atrial fibrillation and is on Eliquis otherwise he really has no major medical problems that would affect wound  healing. 09/07/2021 upon evaluation today patient actually appears to be doing significantly better after having wrapped him last week. Overall I think that this is making significant improvements at this time which is great news. I do not see any evidence of infection which is great news as well. No fevers, chills, nausea, vomiting, or diarrhea. 09/14/2021 upon evaluation today patient appears to be doing well with regard to his leg ulcer. He has been tolerating the dressing changes and overall I think that he is making excellent progress. I do not see any signs of active infection at this time. 09/21/2021 upon evaluation today patient actually appears to be making good progress with regard to his wound this is again measuring smaller today no debridement seems to be necessary. We have been using a silver collagen dressing and I think that is doing an awesome job. 09/28/2021 upon evaluation today patient appears to be doing well with regard to his leg currently. I do not see any signs of active infection at this time which is great news. No fevers, chills, nausea, vomiting, or diarrhea. I think this wound is very close to complete resolution. 10/12/2021 upon evaluation today patient actually appears to be doing awesome in regard to his leg ulcer. In fact this appears to be completely healed based on what I am seeing currently. I do not see any evidence of active infection locally nor systemically at this time which is also great news. No fevers, chills, nausea, vomiting, or diarrhea. Readmission: 12/07/2021 upon evaluation today patient presents for readmission here in the clinic. He was discharged on 10/12/2021 is completely healed. Unfortunately this has MARCIAL, PLESS ([id number]) [id number] Page 3 of 11 reopened at this point and he is having continual issues with new blisters over both lower extremities. This is even worse than what we previously saw. Nonetheless we did  actually check his ABIs today and it did reveal that his ABIs were 0.55 on the left and 0.57 on  the right. Subsequently this is a definite change from his last arterial study which showed that he did have good blood flow at 1.01 on the right and 0.92 on the left and that was right at the beginning of 2019. Nonetheless based on what we see currently I do think he tolerated the 3 layer compression wrap but I do believe that we probably need to get him tested for his arterial flow in order to see where things stand and if there is something we can do there that would help prevent this from continue to be an ongoing issue. He did not utilize compression socks in the interim from when he was last here till this time. That something is probably going to need lifelong going forward as well. 3/9; patient presents for follow-up. He has no issues or complaints today. He tolerated the compression wrap well. He had ABIs with TBI's done. He denies signs of infection. 12/21/2021 upon evaluation today patient appears to be doing well with regard to the wounds on his legs. Both are showing signs of significant improvement which is great news although I do believe some sharp debridement would be of benefit here as well. 12/28/2021 upon evaluation today patient appears to be doing well with regard to his wounds. Everything is showing signs of excellent improvement which I am very pleased about. I think that we are headed in the right direction here. Fortunately there does not appear to be any evidence of infection which is great news there is a little bit of hypergranulation. 01/04/2022 upon evaluation today patient appears to be doing well with regard to his wounds 2 of them are healed 1 is almost so and the other 1 is significantly better. Overall I am extremely pleased with where we stand and I think that he is making excellent progress here. I do not see any evidence of active infection locally nor systemically at  this time. 01-16-2022 upon evaluation today patient's wound on the left leg is showing signs of doing quite well. Has not completely cleared at this point but it is much improved. Fortunately I do not see any signs of infection at this time. No fevers, chills, nausea, vomiting, or diarrhea. 01-23-2022 upon evaluation today patient's wound of the left leg actually appears to be pretty much completely healed which is great news. I do not see any signs of active infection locally or systemically which is excellent. With that being said on the right leg what wound is measuring smaller the other 1 is a new wound that just showed up fortunately its not too bad. Has been using Xeroform here and that seems to be doing decently well which is great news. Unfortunately his blood pressure is significantly high we gave him the readings for the past 4-5 visits as well as a recommendation to make an appointment to go discuss this with his primary care provider patient states that he is going to look into doing this. 01-30-2022 upon evaluation today patient appears to be doing well with regard to his left leg everything appears to be healed. On the right leg the more anterior wound is healed the more medial wound that I been concerned about a possible skin cancer unfortunately still does not look great to me. I do believe that we should probably do a biopsy I have talked about it with him a few times I think though it is probably time to go ahead and do this at this point. 02-09-2022 upon evaluation today  patient appears to be doing well with regard to his legs. On the left this appears to be completely healed. On the right he does have 2 areas and be perfectly honest one of them is a skin cancer that he is going to the Mohs surgery clinic for the other seems to be healing nicely. Readmission: 08-02-2022 upon evaluation today patient appears for reevaluation here in our clinic concerning issues that he has been having with  wounds over the bilateral lower extremities. I last saw him in May 2023 and at that point we had him completely healed. Unfortunately he is tells me this has broken down to some degree since that point. 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 He tells me this 1 seems to just be getting bigger and bigger not improving. Fortunately he is not having any significant pain which is good news he does have quite a bit of swelling and he tells me that his fluid pills are not recommended for him to take daily but just in 3-day intervals here and there. 08-09-2022 upon evaluation today patient appears to be doing still somewhat poorly in regard to his legs although in general he does not appear to be feeling as good as he has been. Fortunately there does not appear to be any signs of infection which is good news. With that being said he is having some issues here with having and overall poor feeling in general which again is good I think going to be the biggest complicating factor. He actually seems to be coughing I do not hear any wheezing right now I did listen to his chest he did not have good airflow down low however makes me suspicious for bronchitis or even possibly pneumonia which could be part of what is going on here as well. Fortunately I do not see any evidence of anything worsening in regard to his legs but I definitely believe that he needs to continue with the compression wraps he took them off yesterday to shower has not had anything on for 24 hours that is why his legs are so swollen today. With regard to his pathology report I did review that it showed some squamous abnormality but no signs of distinct carcinoma. With that being said it was saying that it could be adjacent to a squamous cell  carcinoma nonetheless my suggestion is can be that we have the patient take copy of this report and give it to his Mohs surgeon in order for them to see if there is anything they feel like needs to be done further. With that being said right now I feel like the primary thing is going to be for [place] to try to get his swelling down and keep that down into that hand since he is having so much drainage I believe we can have to bring him in for dressing changes twice a week doing a nurse visit on Mondays. 11/9; since the patient was last here he spent the night in the emergency room he received IV Lasix. Also received antibiotics although he was not discharged on either 1 of these. He also saw his cardiology office who put him on regular Lasix 20 mg [previously on as needed Lasix 20 mg]. Per our intake nurse the swelling in  his legs is remarkably better but he still has bilateral lower extremity wounds. He still has wounds on the bilateral lower extremities most problematically on the left lateral calf. He has been using silver alginate under 3 layer compression. 08-23-2022 upon evaluation today patient appears to be doing much better than the last time I saw him 2 weeks ago. At that point I was very concerned about how he was doing he did see Dr. Caryn Section his primary care provider they got him on some blood pressure medication in general his color and overall appearance looks to be doing much improved compared to the last time I saw him. 09-04-2022 upon evaluation today patient appears to be doing well currently in regard to his wounds. Everything is showing signs of improvement which is great news. Fortunately there does not appear to be any signs of active infection locally or systemically at this time. No fevers, chills, nausea, vomiting, or diarrhea. 09-10-2022 upon evaluation today patient appears to be doing better in regard to his wounds although the [facility] was extremely stuck to the wound  bed. Fortunately there does not appear to be any signs of infection locally or systemically at this time which is great news. No fevers, chills, nausea, vomiting, or diarrhea. 09-17-2022 upon evaluation today patient appears to be doing well currently in regard to his wounds in general. The right leg actually showing signs of excellent improvement and very pleased with where things stand in that regard. Fortunately I do not see any evidence of infection locally or systemically at this time which is great news. No fevers, chills, nausea, vomiting, or diarrhea. Electronic Signature(s) Signed: 09/17/2022 10:53:24 AM By: [name] PA-C Entered By: [name] on 09/17/2022 10:53:24 WINFRED, IIAMS R ([id number]) [id number] Page 4 of 11 -------------------------------------------------------------------------------- Physical Exam Details Patient Name: Date of Service: MO Wandra Scot R. 09/17/2022 9:45 A M Medical Record Number: [id number] Patient Account Number: [id number] Date of Birth/Sex: Treating RN: 1946/05/08 (76 y.o. Jerilynn Mages) Carlene Coria Primary Care Provider: [name] Other Clinician: Massie Kluver Referring Provider: Treating Provider/Extender: [name] in Treatment: 6 Constitutional Well-nourished and well-hydrated in no acute distress. Respiratory normal breathing without difficulty. Psychiatric this patient is able to make decisions and demonstrates good insight into disease process. Alert and Oriented x 3. pleasant and cooperative. Notes Upon inspection patient's wound bed actually showed signs of good granulation epithelization at this point. Fortunately I see no evidence of active infection at this time which is great news and overall I am extremely pleased with where we stand. Electronic Signature(s) Signed: 09/17/2022 10:53:39 AM By: [name] PA-C Entered By: [name] on 09/17/2022  10:53:38 -------------------------------------------------------------------------------- Physician Orders Details Patient Name: Date of Service: MO Palmer, [name] 09/17/2022 9:45 A M Medical Record Number: [id number] Patient Account Number: [id number] Date of Birth/Sex: Treating RN: 1946-07-11 (76 y.o. 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 Bathing/ Shower/ Hygiene May shower with wound dressing protected with water repellent cover or cast protector. No tub bath. Anesthetic (Use 'Patient Medications' Section for Anesthetic Order Entry) Wound #9 Left,Lateral Lower Leg Lidocaine applied to wound bed Edema Control - Lymphedema / Segmental Compressive Device / Other Bilateral Lower Extremities 3 Layer Compression System for Lymphedema. - left lower leg Patient to wear own compression stockings. Remove compression stockings every night before going to bed and put on every morning when getting up. - right lower leg Elevate, Exercise Daily and A void Standing for Long Periods of Time. Elevate legs to the level of the heart and pump ankles as often as possible Elevate leg(s) parallel to the floor when sitting. DO YOUR BEST to  sleep in the bed at night. DO NOT sleep in your recliner. Long hours of sitting in a recliner leads to swelling of the legs and/or potential wounds on your backside. Additional Orders / Instructions Follow Nutritious Diet and Increase Protein Intake Wound Treatment Wound #15 - Lower Leg Wound Laterality: Left, Posterior Prim Dressing: Silvercel Small 2x2 (in/in) 1 x Per Week/30 Days ary Discharge Instructions: Apply Silvercel Small 2x2 (in/in) as instructed Secondary Dressing: Zetuvit Plus 4x8 (in/in) 1 x Per Week/30 Days Secured With: [employer] Sterile or Non-Sterile 6-ply 4.5x4 (yd/yd) 1 x Per Week/30 Days Discharge Instructions: Apply Kerlix as directed Compression Wrap: 3-LAYER WRAP - Profore Lite LF 3 Multilayer Compression Bandaging System 1 x Per Week/30 Days Discharge Instructions: Apply 3 multi-layer wrap as prescribed. Wound #9 - Lower Leg Wound Laterality: Left, Lateral Prim Dressing: Silvercel Small 2x2 (in/in) 1 x Per Week/30 Days ary Discharge Instructions: Apply Silvercel Small 2x2 (in/in) as instructed Secondary Dressing: Zetuvit Plus 4x8 (in/in) 1 x Per Week/30 Days Secured With: [employer] Sterile or Non-Sterile 6-ply 4.5x4 (yd/yd) 1 x Per Week/30 Days Discharge Instructions: Apply Kerlix as directed Compression Wrap: 3-LAYER WRAP - Profore Lite LF 3 Multilayer Compression Bandaging System 1 x Per Week/30 Days Discharge Instructions: Apply 3 multi-layer wrap as prescribed. Laboratory Bacteria identified in Tissue by Biopsy culture (MICRO) - biopsy obtained left posterior lower leg LOINC Code: 20474-3 Convenience Name: Biopsy specimen culture Electronic Signature(s) Signed: 09/17/2022 1:22:59 PM By: [name] PA-C Signed: 09/25/2022 4:45:28 PM By: Massie Kluver Entered By: Massie Kluver on 09/17/2022 10:52:31 JUNAID, WURZER R ([id number]) [id number] Page 6 of  11 -------------------------------------------------------------------------------- Problem List Details Patient Name: Date of Service: [place] Wandra Scot R. 09/17/2022 9:45 A M Medical Record Number: [id number] Patient Account Number: [id number] Date of Birth/Sex: Treating RN: 09/01/1946 (76 y.o. Jerilynn Mages) Carlene Coria Primary Care Provider: [name] Other Clinician: Massie Kluver Referring Provider: Treating Provider/Extender: [name] in Treatment: 6 Active Problems ICD-10 Encounter Code Description Active Date MDM Diagnosis I87.333 Chronic venous hypertension (idiopathic) with ulcer and inflammation of 08/02/2022 No Yes bilateral lower extremity L97.822 Non-pressure chronic ulcer of other part of left lower leg with fat layer 08/02/2022 No Yes exposed L97.812 Non-pressure chronic ulcer of other part of right lower leg with fat layer 08/02/2022 No Yes exposed I73.89 Other specified peripheral vascular diseases 08/02/2022 No Yes I48.0 Paroxysmal atrial fibrillation 08/02/2022 No Yes I10 Essential (primary) hypertension 08/02/2022 No Yes Inactive Problems Resolved Problems Electronic Signature(s) Signed: 09/17/2022 10:33:38 AM By: [name] PA-C Entered By: [name] on 09/17/2022 10:33:38 PHENIX, VANDERMEULEN R ([id number]) [id number] Page 7 of 11 -------------------------------------------------------------------------------- Progress Note Details Patient Name: Date of Service: MO RGA  Cheyenne Adas R. 09/17/2022 9:45 A M Medical Record Number: [id number] Patient Account Number: [id number] Date of Birth/Sex: Treating RN: 1946-04-22 (76 y.o. Jerilynn Mages) Carlene Coria Primary Care Provider: [name] Other Clinician: Massie Kluver Referring Provider: Treating Provider/Extender: [name] in Treatment: 6 Subjective Chief Complaint Information obtained from Patient Bilateral LE Ulcers History of  Present Illness (HPI) 76 year old male who has a past medical history of essential hypertension, chronic atrial fibrillation, peripheral vascular disease, nonischemic cardiomyopathy,venous stasis dermatitis, gouty arthropathy, basal cell carcinoma of the right lower extremity, benign prostatic hypertrophy, long-term use of anticoagulation therapy, hyperglycemia and exercise intolerance has never been a smoker. the patient has had a vascular workup over 7 years ago and said everything was normal at that stage. He does not have any chronic problems except for cardiac issues which he sees a cardiologist in [place]. 08/15/2017 -- arterial and venous duplex studies still pending. 08/23/2017 -- venous reflux studies done on 08/13/2017 shows venous incompetence throughout the left lower extremity deep system and focally at the left saphenofemoral junction. 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 No evidence of SVT or DVT in bilateral lower extremities The patient has an appointment at the end of the month to get his arterial duplex study done 09/05/2017 -- the patient was seen at the vein and vascular office yesterday by Angelena Form. ABI studies were notable for medial calcification and the toe brachial indices were normal and bilateral ankle-brachial) waveforms were normal with triphasic flow. After review of his venous studies he was not a candidate for laser ablation and his lymphedema was to be treated with compression stockings and lymphedema pump pumps 09/12/2017 -- had a low arterial study done at the [place] [facility] -- unable to obtain reliable ABI is due to medial calcification. Bilateral toe indices were normal with the right being 1.01 and the left being 0.92 and the waveforms were triphasic bilaterally. he did get hold of 30-40 mm compression stockings but is unable to put these on. We will try and get him alternative compression stockings. 09/26/17-  he is here in follow up evaluation of a right lower extremity ulcer;he is compliant in wearing compression stocking; ulcer almost epithelialized , anticipate healing next appointment Readmission: 11/17 point upon evaluation patient's wound currently that he is seeing [place] for today is a skin cancerous lesion that was cleared away by his dermatologist on the left medial calf region. He tells me that this is a very similar thing to what he had done previously in fact the last time he saw him in 2018 this was also what was going on at that point. Nonetheless he feels that based on what he seeing currently that this is just having a lot of harder time healing although it is much closer to the surface than what he is experienced in the past. He notes that the initial removal was in June 2022 which was this year this is now November and still has not closed. He does have some edema and definitely I think that there is some venous component to his slow healing here. Also think that we can do something better than Vaseline to try to help with getting this to clear up as quickly as possible. He does have a history of atrial fibrillation and is on Eliquis otherwise he really has no major medical problems that would affect wound healing. 09/07/2021 upon evaluation today patient actually appears to be doing significantly better after  having wrapped him last week. Overall I think that this is making significant improvements at this time which is great news. I do not see any evidence of infection which is great news as well. No fevers, chills, nausea, vomiting, or diarrhea. 09/14/2021 upon evaluation today patient appears to be doing well with regard to his leg ulcer. He has been tolerating the dressing changes and overall I think that he is making excellent progress. I do not see any signs of active infection at this time. 09/21/2021 upon evaluation today patient actually appears to be making good progress with regard  to his wound this is again measuring smaller today no debridement seems to be necessary. We have been using a silver collagen dressing and I think that is doing an awesome job. 09/28/2021 upon evaluation today patient appears to be doing well with regard to his leg currently. I do not see any signs of active infection at this time which is great news. No fevers, chills, nausea, vomiting, or diarrhea. I think this wound is very close to complete resolution. 10/12/2021 upon evaluation today patient actually appears to be doing awesome in regard to his leg ulcer. In fact this appears to be completely healed based on what I am seeing currently. I do not see any evidence of active infection locally nor systemically at this time which is also great news. No fevers, chills, nausea, vomiting, or diarrhea. Readmission: 12/07/2021 upon evaluation today patient presents for readmission here in the clinic. He was discharged on 10/12/2021 is completely healed. Unfortunately this has reopened at this point and he is having continual issues with new blisters over both lower extremities. This is even worse than what we previously saw. Nonetheless we did actually check his ABIs today and it did reveal that his ABIs were 0.55 on the left and 0.57 on the right. Subsequently this is a definite change from his last arterial study which showed that he did have good blood flow at 1.01 on the right and 0.92 on the left and that was right at the beginning of 2019. Nonetheless based on what we see currently I do think he tolerated the 3 layer compression wrap but I do believe that we probably need to get him tested for his arterial flow in order to see where things stand and if there is something we can do there that would help prevent this from continue to be an LUKEN, SHADOWENS ([id number]) [id number] Page 8 of 11 ongoing issue. He did not utilize compression socks in the interim from when he was last  here till this time. That something is probably going to need lifelong going forward as well. 3/9; patient presents for follow-up. He has no issues or complaints today. He tolerated the compression wrap well. He had ABIs with TBI's done. He denies signs of infection. 12/21/2021 upon evaluation today patient appears to be doing well with regard to the wounds on his legs. Both are showing signs of significant improvement which is great news although I do believe some sharp debridement would be of benefit here as well. 12/28/2021 upon evaluation today patient appears to be doing well with regard to his wounds. Everything is showing signs of excellent improvement which I am very pleased about. I think that we are headed in the right direction here. Fortunately there does not appear to be any evidence of infection which is great news there is a little bit of hypergranulation. 01/04/2022 upon evaluation today patient appears to be doing well  with regard to his wounds 2 of them are healed 1 is almost so and the other 1 is significantly better. Overall I am extremely pleased with where we stand and I think that he is making excellent progress here. I do not see any evidence of active infection locally nor systemically at this time. 01-16-2022 upon evaluation today patient's wound on the left leg is showing signs of doing quite well. Has not completely cleared at this point but it is much improved. Fortunately I do not see any signs of infection at this time. No fevers, chills, nausea, vomiting, or diarrhea. 01-23-2022 upon evaluation today patient's wound of the left leg actually appears to be pretty much completely healed which is great news. I do not see any signs of active infection locally or systemically which is excellent. With that being said on the right leg what wound is measuring smaller the other 1 is a new wound that just showed up fortunately its not too bad. Has been using Xeroform here and that  seems to be doing decently well which is great news. Unfortunately his blood pressure is significantly high we gave him the readings for the past 4-5 visits as well as a recommendation to make an appointment to go discuss this with his primary care provider patient states that he is going to look into doing this. 01-30-2022 upon evaluation today patient appears to be doing well with regard to his left leg everything appears to be healed. On the right leg the more anterior wound is healed the more medial wound that I been concerned about a possible skin cancer unfortunately still does not look great to me. I do believe that we should probably do a biopsy I have talked about it with him a few times I think though it is probably time to go ahead and do this at this point. 02-09-2022 upon evaluation today patient appears to be doing well with regard to his legs. On the left this appears to be completely healed. On the right he does have 2 areas and be perfectly honest one of them is a skin cancer that he is going to the Mohs surgery clinic for the other seems to be healing nicely. Readmission: 08-02-2022 upon evaluation today patient appears for reevaluation here in our clinic concerning issues that he has been having with wounds over the bilateral lower extremities. I last saw him in May 2023 and at that point we had him completely healed. Unfortunately he is tells me this has broken down to some degree since that point. 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 He tells me this 1 seems to just be getting bigger and bigger not improving. Fortunately he is not having any significant pain which is good news he does have quite a bit of swelling and he tells me that his fluid pills are not recommended for him to  take daily but just in 3-day intervals here and there. 08-09-2022 upon evaluation today patient appears to be doing still somewhat poorly in regard to his legs although in general he does not appear to be feeling as good as he has been. Fortunately there does not appear to be any signs of infection which is good news. With that being said he is having some  issues here with having and overall poor feeling in general which again is good I think going to be the biggest complicating factor. He actually seems to be coughing I do not hear any wheezing right now I did listen to his chest he did not have good airflow down low however makes me suspicious for bronchitis or even possibly pneumonia which could be part of what is going on here as well. Fortunately I do not see any evidence of anything worsening in regard to his legs but I definitely believe that he needs to continue with the compression wraps he took them off yesterday to shower has not had anything on for 24 hours that is why his legs are so swollen today. With regard to his pathology report I did review that it showed some squamous abnormality but no signs of distinct carcinoma. With that being said it was saying that it could be adjacent to a squamous cell carcinoma nonetheless my suggestion is can be that we have the patient take copy of this report and give it to his Mohs surgeon in order for them to see if there is anything they feel like needs to be done further. With that being said right now I feel like the primary thing is going to be for [place] to try to get his swelling down and keep that down into that hand since he is having so much drainage I believe we can have to bring him in for dressing changes twice a week doing a nurse visit on Mondays. 11/9; since the patient was last here he spent the night in the emergency room he received IV Lasix. Also received antibiotics although he was not discharged on either 1 of these. He also saw his  cardiology office who put him on regular Lasix 20 mg [previously on as needed Lasix 20 mg]. Per our intake nurse the swelling in his legs is remarkably better but he still has bilateral lower extremity wounds. He still has wounds on the bilateral lower extremities most problematically on the left lateral calf. He has been using silver alginate under 3 layer compression. 08-23-2022 upon evaluation today patient appears to be doing much better than the last time I saw him 2 weeks ago. At that point I was very concerned about how he was doing he did see Dr. Caryn Section his primary care provider they got him on some blood pressure medication in general his color and overall appearance looks to be doing much improved compared to the last time I saw him. 09-04-2022 upon evaluation today patient appears to be doing well currently in regard to his wounds. Everything is showing signs of improvement which is great news. Fortunately there does not appear to be any signs of active infection locally or systemically at this time. No fevers, chills, nausea, vomiting, or diarrhea. 09-10-2022 upon evaluation today patient appears to be doing better in regard to his wounds although the [facility] was extremely stuck to the wound bed. Fortunately there does not appear to be any signs of infection locally or systemically at this time which is great news. No fevers, chills, nausea, vomiting, or diarrhea. 09-17-2022 upon evaluation today patient appears to be doing well currently in regard to his wounds in general. The right leg actually showing signs of excellent improvement and very pleased with where things stand in that regard. Fortunately I do not see any evidence of infection locally or systemically at this time which is great news. No fevers, chills, nausea,  vomiting, or diarrhea. Objective Constitutional Well-nourished and well-hydrated in no acute distress. KYJUAN, GAUSE R ([id number])  [id number] Page 9 of 11 Vitals Time Taken: 9:54 AM, Height: 75 in, Weight: 225 lbs, BMI: 28.1, Temperature: 98.2 F, Pulse: 69 bpm, Respiratory Rate: 16 breaths/min, Blood Pressure: 106/56 mmHg. Respiratory normal breathing without difficulty. Psychiatric this patient is able to make decisions and demonstrates good insight into disease process. Alert and Oriented x 3. pleasant and cooperative. General Notes: Upon inspection patient's wound bed actually showed signs of good granulation epithelization at this point. Fortunately I see no evidence of active infection at this time which is great news and overall I am extremely pleased with where we stand. Integumentary (Hair, Skin) Wound #15 status is Open. Original cause of wound was Gradually Appeared. The date acquired was: 08/28/2022. The wound has been in treatment 1 weeks. The wound is located on the Left,Posterior Lower Leg. The wound measures 1cm length x 0.9cm width x 0.5cm depth; 0.707cm^2 area and 0.353cm^3 volume. There is Fat Layer (Subcutaneous Tissue) exposed. There is no tunneling or undermining noted. There is a medium amount of sanguinous drainage noted. Foul odor after cleansing was noted. There is no granulation within the wound bed. There is no necrotic tissue within the wound bed. Wound #16 status is Healed - Epithelialized. Original cause of wound was Gradually Appeared. The date acquired was: 08/28/2022. The wound has been in treatment 1 weeks. The wound is located on the Right,Medial,Anterior Lower Leg. The wound measures 0cm length x 0cm width x 0cm depth; 0cm^2 area and 0cm^3 volume. There is Fat Layer (Subcutaneous Tissue) exposed. There is a none present amount of drainage noted. There is no granulation within the wound bed. There is no necrotic tissue within the wound bed. Wound #17 status is Healed - Epithelialized. Original cause of wound was Gradually Appeared. The date acquired was:  09/10/2022. The wound has been in treatment 1 weeks. The wound is located on the Right,Midline,Posterior Lower Leg. The wound measures 0cm length x 0cm width x 0cm depth; 0cm^2 area and 0cm^3 volume. There is Fat Layer (Subcutaneous Tissue) exposed. There is a none present amount of drainage noted. There is no granulation within the wound bed. There is no necrotic tissue within the wound bed. Wound #18 status is Healed - Epithelialized. Original cause of wound was Gradually Appeared. The date acquired was: 09/10/2022. The wound has been in treatment 1 weeks. The wound is located on the Right,Lateral,Posterior Lower Leg. The wound measures 0cm length x 0cm width x 0cm depth; 0cm^2 area and 0cm^3 volume. There is Fat Layer (Subcutaneous Tissue) exposed. There is a none present amount of drainage noted. There is no granulation within the wound bed. There is no necrotic tissue within the wound bed. Wound #9 status is Open. Original cause of wound was Gradually Appeared. The date acquired was: 01/20/2022. The wound has been in treatment 6 weeks. The wound is located on the Left,Lateral Lower Leg. The wound measures 4cm length x 2cm width x 0.1cm depth; 6.283cm^2 area and 0.628cm^3 volume. There is Fat Layer (Subcutaneous Tissue) exposed. There is a large amount of serosanguineous drainage noted. The wound margin is thickened. There is medium (34- 66%) red, friable, hyper - granulation within the wound bed. There is a medium (34-66%) amount of necrotic tissue within the wound bed including Adherent Slough. Assessment Active Problems ICD-10 Chronic venous hypertension (idiopathic) with ulcer and inflammation of bilateral lower extremity Non-pressure chronic ulcer of other part of left lower  leg with fat layer exposed Non-pressure chronic ulcer of other part of right lower leg with fat layer exposed Other specified peripheral vascular diseases Paroxysmal atrial fibrillation Essential (primary)  hypertension Procedures Wound #15 Pre-procedure diagnosis of Wound #15 is a Venous Leg Ulcer located on the Left, Posterior Lower Leg . There was a biopsy performed by Tommie Sams., PA- C. There was a biopsy performed on Wound Margin. The skin was cleansed and prepped with anti-septic followed by pain control using Lidocaine 5% topical ointment. Tissue was removed at its base with the following instrument(s): Blade, Forceps, and Scissors and sent to pathology. A Moderate amount of bleeding was controlled with Silver Nitrate. The procedure was tolerated well. Post procedure Diagnosis Wound #15: Same as Pre-Procedure Plan Follow-up Appointments: Return Appointment in 1 week. Bathing/ Shower/ Hygiene: May shower with wound dressing protected with water repellent cover or cast protector. No tub bath. Anesthetic (Use 'Patient Medications' Section for Anesthetic Order Entry): Wound #9 Left,Lateral Lower Leg: Lidocaine applied to wound bed Edema Control - Lymphedema / Segmental Compressive Device / Other: 3 Layer Compression System for Lymphedema. - left lower leg Patient to wear own compression stockings. Remove compression stockings every night before going to bed and put on every morning when getting up. - right TORRENCE, HAMMACK ([id number]) [id number] Page 10 of 11 lower leg Elevate, Exercise Daily and Avoid Standing for Long Periods of Time. Elevate legs to the level of the heart and pump ankles as often as possible Elevate leg(s) parallel to the floor when sitting. DO YOUR BEST to sleep in the bed at night. DO NOT sleep in your recliner. Long hours of sitting in a recliner leads to swelling of the legs and/or potential wounds on your backside. Additional Orders / Instructions: Follow Nutritious Diet and Increase Protein Intake Laboratory ordered were: Biopsy specimen culture - biopsy obtained left posterior lower leg WOUND #15: - Lower Leg Wound Laterality:  Left, Posterior Prim Dressing: Silvercel Small 2x2 (in/in) 1 x Per Week/30 Days ary Discharge Instructions: Apply Silvercel Small 2x2 (in/in) as instructed Secondary Dressing: Zetuvit Plus 4x8 (in/in) 1 x Per Week/30 Days Secured With: [employer] Sterile or Non-Sterile 6-ply 4.5x4 (yd/yd) 1 x Per Week/30 Days Discharge Instructions: Apply Kerlix as directed Com pression Wrap: 3-LAYER WRAP - Profore Lite LF 3 Multilayer Compression Bandaging System 1 x Per Week/30 Days Discharge Instructions: Apply 3 multi-layer wrap as prescribed. WOUND #9: - Lower Leg Wound Laterality: Left, Lateral Prim Dressing: Silvercel Small 2x2 (in/in) 1 x Per Week/30 Days ary Discharge Instructions: Apply Silvercel Small 2x2 (in/in) as instructed Secondary Dressing: Zetuvit Plus 4x8 (in/in) 1 x Per Week/30 Days Secured With: [employer] Sterile or Non-Sterile 6-ply 4.5x4 (yd/yd) 1 x Per Week/30 Days Discharge Instructions: Apply Kerlix as directed Com pression Wrap: 3-LAYER WRAP - Profore Lite LF 3 Multilayer Compression Bandaging System 1 x Per Week/30 Days Discharge Instructions: Apply 3 multi-layer wrap as prescribed. 1. I am going to suggest that we have the patient continue to monitor for any signs of infection or worsening. Overall if things continue to improve as they are I think were going to be in good shape. With that being said I am concerned about the area on the medial aspect of his left leg near the top of the calf region. I did actually obtain a biopsy today which was surgically obtained using a scalpel and forceps. Will send this into the pathologist for review. Again my concern is that this could be a  skin cancer as he had multiple carcinomas previous on his legs. Everything else though it seems to be looking quite well. 2. I am also going to recommend that we continue with the silver alginate dressing as well as a 3 layer compression wrap. We will see patient back for reevaluation in 1 week here in  the clinic. If anything worsens or changes patient will contact our office for additional recommendations. Electronic Signature(s) Signed: 09/17/2022 10:54:38 AM By: [name] PA-C Entered By: [name] on 09/17/2022 10:54:38 -------------------------------------------------------------------------------- SuperBill Details Patient Name: Date of Service: [place] Wandra Scot R. 09/17/2022 Medical Record Number: [id number] Patient Account Number: [id number] Date of Birth/Sex: Treating RN: May 28, 1946 (76 y.o. Jerilynn Mages) Carlene Coria Primary Care Provider: [name] Other Clinician: Massie Kluver Referring Provider: Treating Provider/Extender: [name] in Treatment: 6 Diagnosis Coding ICD-10 Codes Code Description [phone] Chronic venous hypertension (idiopathic) with ulcer and inflammation of bilateral lower extremity L97.822 Non-pressure chronic ulcer of other part of left lower leg with fat layer exposed L97.812 Non-pressure chronic ulcer of other part of right lower leg with fat layer exposed I73.89 Other specified peripheral vascular diseases I48.0 Paroxysmal atrial fibrillation I10 Essential (primary) hypertension Facility Procedures : JASYAH, THEURER Code DescriptionROHAN JUENGER ([id number]) [id number]-Incisional biopsy of skin (e.g., wedge) (including simple closure, when performed) single lesion ICD-10 Diagnosis Description L97.822 Non-pressure chronic ulcer of other  part of left lower leg with fat layer exposed Modifier: [id number] 1 Quantity: Page 11 of 11 Physician Procedures : CPT4 Code Description Modifier [id number] - WC PHYS LEVEL 3 - EST PT 25 ICD-10 Diagnosis Description 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 L97.822 Non-pressure chronic ulcer of  other part of left lower leg with fat layer exposed L97.812 Non-pressure chronic ulcer of other  part of right lower leg with fat layer exposed I73.89 Other specified peripheral vascular diseases Quantity: 1 : 11106 Incisional biopsy of skin (e.g., wedge) (including simple closure, when performed) single lesion ICD-10 Diagnosis Description L97.822 Non-pressure chronic ulcer of other part of left lower leg with fat layer exposed Quantity: 1 Electronic Signature(s) Signed: 09/17/2022 10:54:58 AM By: [name] PA-C Entered By: [name] on 09/17/2022 10:54:58

## 2022-09-18 ENCOUNTER — Telehealth: Payer: Self-pay

## 2022-09-18 DIAGNOSIS — I739 Peripheral vascular disease, unspecified: Secondary | ICD-10-CM

## 2022-09-18 NOTE — Telephone Encounter (Signed)
I spoke with the pts son but unfortunately he is not on the [facility] and the pt is unavailable... I sent Dayna Dunn's notes to his My Chart and they will call back and the pt will be available to talk to and give a verbal okay to speak to his son.   I will place order for the [facility] referral and pt has appt with Dr Ellyn Hack 09/26/22.

## 2022-09-18 NOTE — Telephone Encounter (Signed)
-----   Message from Charlie Pitter, [place] sent at 09/14/2022  1:58 PM EST ----- This result note suffices for both LE arterial study and carotid.   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 LE vascular studies suggest calcification of the leg arteries with recommendation for PV consult. Recommendations: - refer to PV for LE PAD - would suggest having BP checked on R arm going forward - get bilateral BPs at f/u 12/20 with Dr. Ellyn Hack - he was going to have his overdue labs drawn at [facility] yesterday when he went for these studies but they did not result yet so please make sure he did get them drawn. These were originally intended for November but he had not yet returned for them.  - start rosuvastatin '20mg'$  daily with f/u fasting CMET/lipids in 2 months  Documentation only: BP noted to be 122/79 at recent wound visit, much improved.

## 2022-09-19 NOTE — Addendum Note (Signed)
Addended by: Stephani Police on: 09/19/2022 09:50 AM   Modules accepted: Orders

## 2022-09-20 DIAGNOSIS — E785 Hyperlipidemia, unspecified: Secondary | ICD-10-CM

## 2022-09-20 LAB — SURGICAL PATHOLOGY

## 2022-09-20 NOTE — Telephone Encounter (Signed)
If possible - we can check labs @ /fu visit. Can also refer to Dr. Myrene Galas.  [place]

## 2022-09-20 NOTE — Telephone Encounter (Signed)
If possible - we can check labs @ /fu visit. Can also refer to Dr. Myrene Galas.   [place]

## 2022-09-21 ENCOUNTER — Ambulatory Visit: Payer: Medicare PPO | Admitting: Family Medicine

## 2022-09-21 NOTE — Telephone Encounter (Signed)
Lpids, Lpa is fine  [facility]

## 2022-09-24 ENCOUNTER — Encounter: Payer: Medicare PPO | Admitting: Physician Assistant

## 2022-09-24 DIAGNOSIS — I87333 Chronic venous hypertension (idiopathic) with ulcer and inflammation of bilateral lower extremity: Secondary | ICD-10-CM | POA: Diagnosis not present

## 2022-09-24 DIAGNOSIS — I10 Essential (primary) hypertension: Secondary | ICD-10-CM | POA: Diagnosis not present

## 2022-09-24 DIAGNOSIS — I428 Other cardiomyopathies: Secondary | ICD-10-CM | POA: Diagnosis not present

## 2022-09-24 DIAGNOSIS — L97822 Non-pressure chronic ulcer of other part of left lower leg with fat layer exposed: Secondary | ICD-10-CM | POA: Diagnosis not present

## 2022-09-24 DIAGNOSIS — R0602 Shortness of breath: Secondary | ICD-10-CM | POA: Diagnosis not present

## 2022-09-24 DIAGNOSIS — R7989 Other specified abnormal findings of blood chemistry: Secondary | ICD-10-CM | POA: Diagnosis not present

## 2022-09-24 DIAGNOSIS — Z7901 Long term (current) use of anticoagulants: Secondary | ICD-10-CM | POA: Diagnosis not present

## 2022-09-24 DIAGNOSIS — I4821 Permanent atrial fibrillation: Secondary | ICD-10-CM | POA: Diagnosis not present

## 2022-09-24 DIAGNOSIS — R739 Hyperglycemia, unspecified: Secondary | ICD-10-CM | POA: Diagnosis not present

## 2022-09-24 DIAGNOSIS — I480 Paroxysmal atrial fibrillation: Secondary | ICD-10-CM | POA: Diagnosis not present

## 2022-09-24 DIAGNOSIS — D6869 Other thrombophilia: Secondary | ICD-10-CM | POA: Diagnosis not present

## 2022-09-24 DIAGNOSIS — I739 Peripheral vascular disease, unspecified: Secondary | ICD-10-CM | POA: Diagnosis not present

## 2022-09-24 DIAGNOSIS — L97812 Non-pressure chronic ulcer of other part of right lower leg with fat layer exposed: Secondary | ICD-10-CM | POA: Diagnosis not present

## 2022-09-24 DIAGNOSIS — R918 Other nonspecific abnormal finding of lung field: Secondary | ICD-10-CM | POA: Diagnosis not present

## 2022-09-24 DIAGNOSIS — I5033 Acute on chronic diastolic (congestive) heart failure: Secondary | ICD-10-CM | POA: Diagnosis not present

## 2022-09-24 DIAGNOSIS — M7989 Other specified soft tissue disorders: Secondary | ICD-10-CM | POA: Diagnosis not present

## 2022-09-24 DIAGNOSIS — E785 Hyperlipidemia, unspecified: Secondary | ICD-10-CM | POA: Diagnosis not present

## 2022-09-24 DIAGNOSIS — R053 Chronic cough: Secondary | ICD-10-CM | POA: Diagnosis not present

## 2022-09-24 DIAGNOSIS — I872 Venous insufficiency (chronic) (peripheral): Secondary | ICD-10-CM | POA: Diagnosis not present

## 2022-09-24 NOTE — Progress Notes (Signed)
ADYEN, BIFULCO R ([id number]) [id number] Page 1 of 2 Visit Report for 09/24/2022 Arrival Information Details Patient Name: Date of Service: [name] 09/24/2022 11:15 A M Medical Record Number: [id number] Patient Account Number: [id number] Date of Birth/Sex: Treating RN: Aug 21, 1946 (76 y.o. Male) [name] Primary Care Kjirsten Bloodgood: [name] Other Clinician: Massie Kluver Referring Senai Ramnath: Treating Wagner Tanzi/Extender: [name] in Treatment: 7 Visit Information History Since Last Visit Added or deleted any medications: No Patient Arrived: Ambulatory Any new allergies or adverse reactions: No Arrival Time: 11:18 Had a fall or experienced change in No Accompanied By: self activities of daily living that may affect Transfer Assistance: None risk of falls: Patient Identification Verified: Yes Hospitalized since last visit: No Secondary Verification Process Completed: Yes Pain Present Now: No Patient Requires Transmission-Based No Precautions: Patient Has Alerts: Yes Patient Alerts: Patient on Blood Thinner 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 09/24/2022 11:15 A M Medical Record Number: [id number] Patient Account Number: [id number] Date of Birth/Sex: Treating RN: 1946-07-16 (76 y.o. Male) [name] Primary Care Jayli Fogleman: [name] Other Clinician: Referring Rori Goar: Treating Axten Pascucci/Extender: [name] in Treatment: 7 Active Problems Location of Pain Severity and Description of Pain Patient Has Paino No Site Locations [place] ([id number]) [id number] Page 2 of 2 Pain  Management and Medication Current Pain Management: Electronic Signature(s) Unsigned Entered By: [name] on 09/24/2022 11:25:40 -------------------------------------------------------------------------------- Vitals Details Patient Name: Date of Service: [name] 09/24/2022 11:15 A M Medical Record Number: [id number] Patient Account Number: [id number] Date of Birth/Sex: Treating RN: [birth year] (76 y.o. Male) [name] Primary Care Lochlann Mastrangelo: [name] Other Clinician: Referring Chevis Weisensel: Treating Ashritha Desrosiers/Extender: [name] in Treatment: 7 Vital Signs Time Taken: 11:21 Temperature (F): 97.5 Height (in): 75 Pulse (bpm): 71 Weight (lbs): 225 Respiratory Rate (breaths/min): 16 Body Mass Index (BMI): 28.1 Blood Pressure (mmHg): 133/70 Reference Range: 80 - 120 mg / dl Electronic Signature(s) Unsigned Entered By: [name] on 09/24/2022 11:25:34 Signature(s): Date(s):

## 2022-09-24 NOTE — Progress Notes (Signed)
SOTERO, BRINKMEYER R ([id number]) [id number] Page 1 of 2 Visit Report for 09/24/2022 Chief Complaint Document Details Patient Name: Date of Service: [name] Wandra Scot R. 09/24/2022 11:15 A M Medical Record Number: [id number] Patient Account Number: [id number] Date of Birth/Sex: Treating RN: [birth year] (76 y.o. Male) Cornell Barman Primary Care Provider: [name] Other Clinician: Massie Kluver Referring Provider: Treating Provider/Extender: [name] in Treatment: 7 Information Obtained from: Patient Chief Complaint Bilateral LE Ulcers Electronic Signature(s) Signed: 09/24/2022 11:11:41 AM By: [name] PA-C Entered By: [name] on 09/24/2022 11:11:41 -------------------------------------------------------------------------------- Problem List Details Patient Name: Date of Service: [name] Wandra Scot R. 09/24/2022 11:15 A M Medical Record Number: [id number] Patient Account Number: [id number] Date of Birth/Sex: Treating RN: 03-Jul-1946 (76 y.o. 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 08/02/2022 No Yes bilateral lower extremity L97.822 Non-pressure chronic ulcer of other part of left lower leg with fat layer 08/02/2022 No Yes exposed L97.812 Non-pressure chronic ulcer of other part of right lower leg with fat layer 08/02/2022 No Yes exposed [name], [name] ([id number]) [id number] Page 2 of 2 I73.89 Other specified peripheral vascular diseases 08/02/2022 No Yes I48.0 Paroxysmal atrial fibrillation 08/02/2022 No Yes I10 Essential (primary) hypertension 08/02/2022 No Yes Inactive  Problems Resolved Problems Electronic Signature(s) Signed: 09/24/2022 11:11:37 AM By: [name] PA-C Entered By: [name] on 09/24/2022 11:11:37

## 2022-09-25 ENCOUNTER — Encounter: Payer: Self-pay | Admitting: Cardiovascular Disease

## 2022-09-25 ENCOUNTER — Ambulatory Visit: Payer: Medicare PPO | Attending: Cardiovascular Disease | Admitting: Cardiovascular Disease

## 2022-09-25 VITALS — BP 122/68 | HR 72 | Ht 75.0 in | Wt 231.8 lb

## 2022-09-25 DIAGNOSIS — E785 Hyperlipidemia, unspecified: Secondary | ICD-10-CM

## 2022-09-25 DIAGNOSIS — I4821 Permanent atrial fibrillation: Secondary | ICD-10-CM | POA: Diagnosis not present

## 2022-09-25 DIAGNOSIS — I739 Peripheral vascular disease, unspecified: Secondary | ICD-10-CM

## 2022-09-25 DIAGNOSIS — I5032 Chronic diastolic (congestive) heart failure: Secondary | ICD-10-CM

## 2022-09-25 DIAGNOSIS — I779 Disorder of arteries and arterioles, unspecified: Secondary | ICD-10-CM

## 2022-09-25 DIAGNOSIS — C44729 Squamous cell carcinoma of skin of left lower limb, including hip: Secondary | ICD-10-CM | POA: Diagnosis not present

## 2022-09-25 LAB — CALCIUM, IONIZED: Calcium, Ion: 5.4 mg/dL (ref 4.5–5.6)

## 2022-09-25 LAB — HEMOGLOBIN A1C
Est. average glucose Bld gHb Est-mCnc: 131 mg/dL
Hgb A1c MFr Bld: 6.2 % — ABNORMAL HIGH (ref 4.8–5.6)

## 2022-09-25 LAB — COMPREHENSIVE METABOLIC PANEL
ALT: 22 IU/L (ref 0–44)
AST: 23 IU/L (ref 0–40)
Albumin/Globulin Ratio: 1.5 (ref 1.2–2.2)
Albumin: 4.3 g/dL (ref 3.8–4.8)
Alkaline Phosphatase: 135 IU/L — ABNORMAL HIGH (ref 44–121)
BUN/Creatinine Ratio: 13 (ref 10–24)
BUN: 12 mg/dL (ref 8–27)
Bilirubin Total: 0.6 mg/dL (ref 0.0–1.2)
CO2: 25 mmol/L (ref 20–29)
Calcium: 10.6 mg/dL — ABNORMAL HIGH (ref 8.6–10.2)
Chloride: 98 mmol/L (ref 96–106)
Creatinine, Ser: 0.9 mg/dL (ref 0.76–1.27)
Globulin, Total: 2.9 g/dL (ref 1.5–4.5)
Glucose: 108 mg/dL — ABNORMAL HIGH (ref 70–99)
Potassium: 4 mmol/L (ref 3.5–5.2)
Sodium: 141 mmol/L (ref 134–144)
Total Protein: 7.2 g/dL (ref 6.0–8.5)
eGFR: 90 mL/min/{1.73_m2} (ref >59)

## 2022-09-25 LAB — BASIC METABOLIC PANEL
BUN/Creatinine Ratio: 14 (ref 10–24)
BUN: 13 mg/dL (ref 8–27)
CO2: 28 mmol/L (ref 20–29)
Calcium: 10.4 mg/dL — ABNORMAL HIGH (ref 8.6–10.2)
Chloride: 98 mmol/L (ref 96–106)
Creatinine, Ser: 0.93 mg/dL (ref 0.76–1.27)
Glucose: 109 mg/dL — ABNORMAL HIGH (ref 70–99)
Potassium: 4 mmol/L (ref 3.5–5.2)
Sodium: 141 mmol/L (ref 134–144)
eGFR: 86 mL/min/{1.73_m2} (ref >59)

## 2022-09-25 LAB — CBC WITH DIFFERENTIAL/PLATELET
Basophils Absolute: 0.1 10*3/uL (ref 0.0–0.2)
Basos: 1 %
EOS (ABSOLUTE): 0.2 10*3/uL (ref 0.0–0.4)
Eos: 4 %
Hematocrit: 37.8 % (ref 37.5–51.0)
Hemoglobin: 12.6 g/dL — ABNORMAL LOW (ref 13.0–17.7)
Immature Grans (Abs): 0 10*3/uL (ref 0.0–0.1)
Immature Granulocytes: 0 %
Lymphocytes Absolute: 1.2 10*3/uL (ref 0.7–3.1)
Lymphs: 20 %
MCH: 30.5 pg (ref 26.6–33.0)
MCHC: 33.3 g/dL (ref 31.5–35.7)
MCV: 92 fL (ref 79–97)
Monocytes Absolute: 0.6 10*3/uL (ref 0.1–0.9)
Monocytes: 10 %
Neutrophils Absolute: 4 10*3/uL (ref 1.4–7.0)
Neutrophils: 65 %
Platelets: 313 10*3/uL (ref 150–450)
RBC: 4.13 x10E6/uL — ABNORMAL LOW (ref 4.14–5.80)
RDW: 13.1 % (ref 11.6–15.4)
WBC: 6.1 10*3/uL (ref 3.4–10.8)

## 2022-09-25 LAB — LIPID PANEL
Chol/HDL Ratio: 3.2 ratio (ref 0.0–5.0)
Cholesterol, Total: 187 mg/dL (ref 100–199)
HDL: 58 mg/dL (ref >39)
LDL Chol Calc (NIH): 112 mg/dL — ABNORMAL HIGH (ref 0–99)
Triglycerides: 96 mg/dL (ref 0–149)
VLDL Cholesterol Cal: 17 mg/dL (ref 5–40)

## 2022-09-25 LAB — T3: T3, Total: 85 ng/dL (ref 71–180)

## 2022-09-25 LAB — TROPONIN T: Troponin T (Highly Sensitive): 24 ng/L (ref 0–22)

## 2022-09-25 LAB — TSH: TSH: 6.52 u[IU]/mL — ABNORMAL HIGH (ref 0.450–4.500)

## 2022-09-25 LAB — PRO B NATRIURETIC PEPTIDE: NT-Pro BNP: 854 pg/mL — ABNORMAL HIGH (ref 0–376)

## 2022-09-25 LAB — LIPOPROTEIN A (LPA): Lipoprotein (a): 51.8 nmol/L (ref <75.0)

## 2022-09-25 LAB — T4, FREE: Free T4: 0.91 ng/dL (ref 0.82–1.77)

## 2022-09-25 MED ORDER — ROSUVASTATIN CALCIUM 10 MG PO TABS: 10.0000 mg | ORAL_TABLET | Freq: Every day | ORAL | 3 refills | Status: DC

## 2022-09-25 NOTE — Patient Instructions (Addendum)
Medication Instructions:  START Rosuvastatin 10 mg once daily  *If you need a refill on your cardiac medications before your next appointment, please call your pharmacy*   Lab Work: Your provider would like for you to return in 2 months to have the following labs drawn: fasting Lipid and Liver. You do not need an appointment for the lab. Once in our office lobby there is a podium where you can sign in and ring the doorbell to alert [place] that you are here. The lab is open from 8:00 am to 4 pm; closed for lunch from 12:45pm-1:45pm.  You may also go to any of these [facility] locations:   [place] ([place]) - [address]   If you have labs (blood work) drawn today and your tests are completely normal, you will receive your results only by: [employer] (if you have MyChart) OR A paper copy in the mail If you have any lab test that is abnormal or we need to change your treatment, we will call you to review the results.   Testing/Procedures: None ordered   Follow-Up: At [facility], you and your health needs are our priority.  As part of our continuing mission to provide you with exceptional heart care, we have created designated Provider Care Teams.  These Care Teams include your primary Cardiologist (physician) and Advanced Practice Providers (APPs -  Physician Assistants and Nurse Practitioners) who all work together to provide you with the care you need, when you need it.  We recommend signing up for the patient portal called "MyChart".  Sign up information is provided on this After Visit Summary.  MyChart is used to connect with patients for Virtual Visits (Telemedicine).  Patients are able to view lab/test results, encounter notes, upcoming appointments, etc.  Non-urgent messages can be sent to your provider as well.   To learn more about what you can do with MyChart, go to  [email or web address].    Your next appointment:   6 month(s)  The format for your next appointment:   In Person  Provider:   Dr. Fletcher Anon

## 2022-09-25 NOTE — Progress Notes (Signed)
tissue integrity Knowledge deficit related to smoking impact on wound healing Knowledge deficit related to ulceration/compromised skin integrity Goals: Patient/caregiver will verbalize understanding of skin care regimen Date Initiated: 09/04/2022 Date Inactivated: 09/10/2022 Target Resolution Date: 10/04/2022 Goal Status: Met Ulcer/skin breakdown will have [name] volume reduction  of 30% by week 4 Date Initiated: 09/04/2022 Date Inactivated: 09/10/2022 Target Resolution Date: 10/04/2022 Goal Status: Unmet Unmet Reason: comorbidities Ulcer/skin breakdown will have [name] volume reduction of 50% by week 8 Date Initiated: 09/04/2022 Target Resolution Date: 11/04/2022 Goal Status: Active Ulcer/skin breakdown will have [name] volume reduction of 80% by week 12 Date Initiated: 09/04/2022 Target Resolution Date: 12/05/2022 Goal Status: Active Ulcer/skin breakdown will heal within 14 weeks Date Initiated: 09/04/2022 Target Resolution Date: 12/12/2022 Goal Status: Active Interventions: Assess patient/caregiver ability to obtain necessary supplies Assess patient/caregiver ability to perform ulcer/skin care regimen upon admission and as needed Assess ulceration(s) every visit Provide education on ulcer and skin care Notes: [name], [name] ([id number]) [id number] Page 7 of 14 Electronic Signature(s) Signed: 09/18/2022 4:34:43 PM By: [name] Coria RN Signed: 09/25/2022 4:45:28 PM By: [name] Entered By: [name] on 09/17/2022 10:50:20 -------------------------------------------------------------------------------- Pain Assessment Details Patient Name: Date of Service: [name], [name] 09/17/2022 9:45 [name] M Medical Record Number: [id number] Patient Account Number: [id number] Date of Birth/Sex: Treating RN: [birth year] (76 y.o. [name]) [name] Primary Care [name]: [name] Other Clinician: Massie [name] Referring [name]: Treating [name]/Extender: [name] in Treatment: 6 Active Problems Location of Pain Severity and Description of Pain Patient Has Paino No Site Locations Pain Management and Medication Current Pain Management: Electronic Signature(s) Signed: 09/18/2022 4:34:43 PM By: [name] Coria RN Signed: 09/25/2022 4:45:28 PM By: [name] Entered By: [name] on 09/17/2022  09:57:27 [name] ([id number]) [id number] Page 8 of 14 -------------------------------------------------------------------------------- Patient/Caregiver Education Details Patient Name: Date of Service: [name] 12/11/2023andnbsp9:45 [place] Record Number: [id number] Patient Account Number: [id number] Date of Birth/Gender: Treating RN: 1946-04-14 (76 y.o. [name]) [name] Primary Care Physician: [name] Other Clinician: Massie [name] Referring Physician: Treating Physician/Extender: [name] in Treatment: 6 Education Assessment Education Provided To: Patient Education Topics Provided Wound/Skin Impairment: Handouts: Other: continue wound care as directed Methods: Explain/Verbal Responses: State content correctly Electronic Signature(s) Signed: 09/25/2022 4:45:28 PM By: [name] Entered By: [name] on 09/17/2022 10:50:15 -------------------------------------------------------------------------------- Wound Assessment Details Patient Name: Date of Service: [name], [name] 09/17/2022 9:45 [name] M Medical Record Number: [id number] Patient Account Number: [id number] Date of Birth/Sex: Treating RN: [birth year] (76 y.o. [name]) [name] Primary Care [name]: [name] Other Clinician: Massie [name] Referring [name]: Treating [name]/Extender: [name] in Treatment: 6 Wound Status Wound Number: 15 Primary Etiology: Venous Leg Ulcer Wound Location: Left, Posterior Lower Leg Wound Status: Open Wounding Event: Gradually Appeared Comorbid History: Arrhythmia, Hypertension, Gout Date Acquired: 08/28/2022 Weeks Of Treatment: 1 Clustered Wound: No Photos Wound Measurements Length: (cm) 1 Riehle, [name] ([id number]) Width: (cm) 0.9 Depth: (cm) 0.5 Area: (cm) 0.707 Volume: (cm) 0.353 % Reduction in Area: 75% [id number] Page  9 of 14 % Reduction in Volume: -1160.7% Epithelialization: None Tunneling: No Undermining: No Wound Description Classification: Full Thickness Without Exposed Support Exudate Amount: Medium Exudate Type: Sanguinous Exudate Color: red Structures Foul Odor After Cleansing: Yes Due to Product Use: No Slough/Fibrino No Wound Bed Granulation Amount: None Present (0%) Exposed Structure Necrotic Amount: None Present (0%) Fascia Exposed: No Fat Layer (  Subcutaneous Tissue) Exposed: Yes Tendon Exposed: No Muscle Exposed: No Joint Exposed: No Bone Exposed: No Electronic Signature(s) Signed: 09/18/2022 4:34:43 PM By: [name] Coria RN Signed: 09/25/2022 4:45:28 PM By: [name] Entered By: [name] on 09/17/2022 10:13:21 -------------------------------------------------------------------------------- Wound Assessment Details Patient Name: Date of Service: [name], [name] 09/17/2022 9:45 [name] M Medical Record Number: [id number] Patient Account Number: [id number] Date of Birth/Sex: Treating RN: 1945-12-28 (76 y.o. [name]) [name] Primary Care Tiffanny Lamarche: [name] Other Clinician: Massie [name] Referring Charlie Seda: Treating Yann Biehn/Extender: [name] in Treatment: 6 Wound Status Wound Number: 16 Primary Etiology: Venous Leg Ulcer Wound Location: Right, Medial, Anterior Lower Leg Wound Status: Healed - Epithelialized Wounding Event: Gradually Appeared Comorbid History: Arrhythmia, Hypertension, Gout Date Acquired: 08/28/2022 Weeks Of Treatment: 1 Clustered Wound: No Photos Wound Measurements Length: (cm) 0 Width: (cm) 0 Depth: (cm) 0 Ing, [name] ([id number]) Area: (cm) 0 Volume: (cm) 0 % Reduction in Area: 100% % Reduction in Volume: 100% Epithelialization: Large (67-100%) [id number] Page 10 of 14 Wound Description Classification: Full Thickness Without Exposed Support Exudate Amount: None  Present Structures Foul Odor After Cleansing: No Slough/Fibrino No Wound Bed Granulation Amount: None Present (0%) Exposed Structure Necrotic Amount: None Present (0%) Fascia Exposed: No Fat Layer (Subcutaneous Tissue) Exposed: Yes Tendon Exposed: No Muscle Exposed: No Joint Exposed: No Bone Exposed: No Treatment Notes Wound #16 (Lower Leg) Wound Laterality: Right, Medial, Anterior Cleanser Peri-Wound Care Topical Primary Dressing Secondary Dressing Secured With Compression Wrap Compression Stockings Add-Ons Electronic Signature(s) Signed: 09/18/2022 4:34:43 PM By: [name] Coria RN Signed: 09/25/2022 4:45:28 PM By: [name] Entered By: [name] on 09/17/2022 10:37:10 -------------------------------------------------------------------------------- Wound Assessment Details Patient Name: Date of Service: [name], [name] 09/17/2022 9:45 [name] M Medical Record Number: [id number] Patient Account Number: [id number] Date of Birth/Sex: Treating RN: May 01, 1946 (76 y.o. [name]) [name] Primary Care Cuyler Vandyken: [name] Other Clinician: Massie [name] Referring Enes Wegener: Treating Nikitha Mode/Extender: [name] in Treatment: 6 Wound Status Wound Number: 17 Primary Etiology: Venous Leg Ulcer Wound Location: Right, Midline, Posterior Lower Leg Wound Status: Healed - Epithelialized Wounding Event: Gradually Appeared Comorbid History: Arrhythmia, Hypertension, Gout Date Acquired: 09/10/2022 Weeks Of Treatment: 1 Clustered Wound: No Photos [name], [name] ([id number]) [id number] Page 11 of 14 Wound Measurements Length: (cm) Width: (cm) Depth: (cm) Area: (cm) Volume: (cm) 0 % Reduction in Area: 100% 0 % Reduction in Volume: 100% 0 Epithelialization: Large (67-100%) 0 0 Wound Description Classification: Full Thickness Without Exposed Support Exudate Amount: None Present Structures Foul Odor After Cleansing:  No Slough/Fibrino No Wound Bed Granulation Amount: None Present (0%) Exposed Structure Necrotic Amount: None Present (0%) Fascia Exposed: No Fat Layer (Subcutaneous Tissue) Exposed: Yes Tendon Exposed: No Muscle Exposed: No Joint Exposed: No Bone Exposed: No Treatment Notes Wound #17 (Lower Leg) Wound Laterality: Right, Midline, Posterior Cleanser Peri-Wound Care Topical Primary Dressing Secondary Dressing Secured With Compression Wrap Compression Stockings Add-Ons Electronic Signature(s) Signed: 09/18/2022 4:34:43 PM By: [name] Coria RN Signed: 09/25/2022 4:45:28 PM By: [name] Entered By: [name] on 09/17/2022 10:37:43 -------------------------------------------------------------------------------- Wound Assessment Details Patient Name: Date of Service: [name], [name] 09/17/2022 9:45 [name], [name] ([id number]) [id number] Page 12 of 14 Medical Record Number: [id number] Patient Account Number: [id number] Date of Birth/Sex: Treating RN: 29-Mar-1946 (76 y.o. [name]) [name] Primary Care Jazyah Butsch: [name] Other Clinician: Massie [name] Referring Makara Lanzo: Treating Joshoa Shawler/Extender: [name] in Treatment: 6  tissue integrity Knowledge deficit related to smoking impact on wound healing Knowledge deficit related to ulceration/compromised skin integrity Goals: Patient/caregiver will verbalize understanding of skin care regimen Date Initiated: 09/04/2022 Date Inactivated: 09/10/2022 Target Resolution Date: 10/04/2022 Goal Status: Met Ulcer/skin breakdown will have [name] volume reduction  of 30% by week 4 Date Initiated: 09/04/2022 Date Inactivated: 09/10/2022 Target Resolution Date: 10/04/2022 Goal Status: Unmet Unmet Reason: comorbidities Ulcer/skin breakdown will have [name] volume reduction of 50% by week 8 Date Initiated: 09/04/2022 Target Resolution Date: 11/04/2022 Goal Status: Active Ulcer/skin breakdown will have [name] volume reduction of 80% by week 12 Date Initiated: 09/04/2022 Target Resolution Date: 12/05/2022 Goal Status: Active Ulcer/skin breakdown will heal within 14 weeks Date Initiated: 09/04/2022 Target Resolution Date: 12/12/2022 Goal Status: Active Interventions: Assess patient/caregiver ability to obtain necessary supplies Assess patient/caregiver ability to perform ulcer/skin care regimen upon admission and as needed Assess ulceration(s) every visit Provide education on ulcer and skin care Notes: [name], [name] ([id number]) [id number] Page 7 of 14 Electronic Signature(s) Signed: 09/18/2022 4:34:43 PM By: [name] Coria RN Signed: 09/25/2022 4:45:28 PM By: [name] Entered By: [name] on 09/17/2022 10:50:20 -------------------------------------------------------------------------------- Pain Assessment Details Patient Name: Date of Service: [name], [name] 09/17/2022 9:45 [name] M Medical Record Number: [id number] Patient Account Number: [id number] Date of Birth/Sex: Treating RN: [birth year] (76 y.o. [name]) [name] Primary Care [name]: [name] Other Clinician: Massie [name] Referring [name]: Treating [name]/Extender: [name] in Treatment: 6 Active Problems Location of Pain Severity and Description of Pain Patient Has Paino No Site Locations Pain Management and Medication Current Pain Management: Electronic Signature(s) Signed: 09/18/2022 4:34:43 PM By: [name] Coria RN Signed: 09/25/2022 4:45:28 PM By: [name] Entered By: [name] on 09/17/2022  09:57:27 [name] ([id number]) [id number] Page 8 of 14 -------------------------------------------------------------------------------- Patient/Caregiver Education Details Patient Name: Date of Service: [name] 12/11/2023andnbsp9:45 [place] Record Number: [id number] Patient Account Number: [id number] Date of Birth/Gender: Treating RN: 1946-04-14 (76 y.o. [name]) [name] Primary Care Physician: [name] Other Clinician: Massie [name] Referring Physician: Treating Physician/Extender: [name] in Treatment: 6 Education Assessment Education Provided To: Patient Education Topics Provided Wound/Skin Impairment: Handouts: Other: continue wound care as directed Methods: Explain/Verbal Responses: State content correctly Electronic Signature(s) Signed: 09/25/2022 4:45:28 PM By: [name] Entered By: [name] on 09/17/2022 10:50:15 -------------------------------------------------------------------------------- Wound Assessment Details Patient Name: Date of Service: [name], [name] 09/17/2022 9:45 [name] M Medical Record Number: [id number] Patient Account Number: [id number] Date of Birth/Sex: Treating RN: [birth year] (76 y.o. [name]) [name] Primary Care [name]: [name] Other Clinician: Massie [name] Referring [name]: Treating [name]/Extender: [name] in Treatment: 6 Wound Status Wound Number: 15 Primary Etiology: Venous Leg Ulcer Wound Location: Left, Posterior Lower Leg Wound Status: Open Wounding Event: Gradually Appeared Comorbid History: Arrhythmia, Hypertension, Gout Date Acquired: 08/28/2022 Weeks Of Treatment: 1 Clustered Wound: No Photos Wound Measurements Length: (cm) 1 Riehle, [name] ([id number]) Width: (cm) 0.9 Depth: (cm) 0.5 Area: (cm) 0.707 Volume: (cm) 0.353 % Reduction in Area: 75% [id number].pdf Page  9 of 14 % Reduction in Volume: -1160.7% Epithelialization: None Tunneling: No Undermining: No Wound Description Classification: Full Thickness Without Exposed Support Exudate Amount: Medium Exudate Type: Sanguinous Exudate Color: red Structures Foul Odor After Cleansing: Yes Due to Product Use: No Slough/Fibrino No Wound Bed Granulation Amount: None Present (0%) Exposed Structure Necrotic Amount: None Present (0%) Fascia Exposed: No Fat Layer (  Wound Status Wound Number: 18 Primary Etiology: Venous Leg Ulcer Wound Location: Right, Lateral, Posterior Lower Leg Wound Status: Healed - Epithelialized Wounding Event: Gradually Appeared Comorbid History: Arrhythmia, Hypertension, Gout Date Acquired: 09/10/2022 Weeks Of Treatment: 1 Clustered Wound: No Photos Wound Measurements Length: (cm) Width: (cm) Depth: (cm) Area: (cm) Volume: (cm) 0 % Reduction in Area: 100% 0 % Reduction in Volume: 100% 0 Epithelialization: Large (67-100%) 0 0 Wound Description Classification: Full Thickness Without Exposed Support Exudate Amount: None Present Structures Foul Odor After Cleansing: No Slough/Fibrino Yes Wound Bed Granulation  Amount: None Present (0%) Exposed Structure Necrotic Amount: None Present (0%) Fat Layer (Subcutaneous Tissue) Exposed: Yes Treatment Notes Wound #18 (Lower Leg) Wound Laterality: Right, Lateral, Posterior Cleanser Peri-Wound Care Topical Primary Dressing Secondary Dressing Secured With Compression Wrap Compression Stockings Add-Ons Electronic Signature(s) Signed: 09/18/2022 4:34:43 PM By: [name] Coria RN Signed: 09/25/2022 4:45:28 PM By: [name] Entered By: [name] on 09/17/2022 10:38:05 [name] ([id number]) [id number].pdf Page 13 of 14 -------------------------------------------------------------------------------- Wound Assessment Details Patient Name: Date of Service: [name] 09/17/2022 9:45 [name] M Medical Record Number: [id number] Patient Account Number: [id number] Date of Birth/Sex: Treating RN: 04-Dec-1945 (76 y.o. [name]) [name] Primary Care Klee Kolek: [name] Other Clinician: Massie [name] Referring Alyia Lacerte: Treating Nautia Lem/Extender: [name] in Treatment: 6 Wound Status Wound Number: 9 Primary Etiology: Venous Leg Ulcer Wound Location: Left, Lateral Lower Leg Wound Status: Open Wounding Event: Gradually Appeared Comorbid History: Arrhythmia, Hypertension, Gout Date Acquired: 01/20/2022 Weeks Of Treatment: 6 Clustered Wound: No Photos Wound Measurements Length: (cm) 4 Width: (cm) 2 Depth: (cm) 0.1 Area: (cm) 6.283 Volume: (cm) 0.628 % Reduction in Area: 64.2% % Reduction in Volume: 82.1% Epithelialization: Medium (34-66%) Wound Description Classification: Full Thickness Without Exposed Suppor Wound Margin: Thickened Exudate Amount: Large Exudate Type: Serosanguineous Exudate Color: red, brown t Structures Foul Odor After Cleansing: No Slough/Fibrino Yes Wound Bed Granulation Amount: Medium (34-66%) Exposed Structure Granulation Quality: Red, Hyper-granulation,  Friable Fascia Exposed: No Necrotic Amount: Medium (34-66%) Fat Layer (Subcutaneous Tissue) Exposed: Yes Necrotic Quality: Adherent Slough Tendon Exposed: No Muscle Exposed: No Joint Exposed: No Bone Exposed: No Electronic Signature(s) Signed: 09/18/2022 4:34:43 PM By: [name] Coria RN Signed: 09/25/2022 4:45:28 PM By: [name] Entered By: [name] on 09/17/2022 10:17:05 [name] ([id number]) [id number].pdf Page 14 of 14 -------------------------------------------------------------------------------- Vitals Details Patient Name: Date of Service: [name] 09/17/2022 9:45 [name] M Medical Record Number: [id number] Patient Account Number: [id number] Date of Birth/Sex: Treating RN: 1946-06-06 (76 y.o. [name]) [name] Primary Care Reginna Sermeno: [name] Other Clinician: Massie [name] Referring Edon Hoadley: Treating Jariah Jarmon/Extender: [name] in Treatment: 6 Vital Signs Time Taken: 09:54 Temperature (F): 98.2 Height (in): 75 Pulse (bpm): 69 Weight (lbs): 225 Respiratory Rate (breaths/min): 16 Body Mass Index (BMI): 28.1 Blood Pressure (mmHg): 106/56 Reference Range: 80 - 120 mg / dl Electronic Signature(s) Signed: 09/25/2022 4:45:28 PM By: [name] Entered By: [name] on 09/17/2022 09:57:24  34-66%) Necrotic Amount: Fat Layer (Subcutaneous Tissue): Yes Fat Layer (Subcutaneous Tissue): Yes Fat Layer (Subcutaneous Tissue): Yes Exposed Structures: Fascia: No Fascia: No Fascia: No Tendon: No Tendon: No Tendon: No Muscle: No Muscle: No Muscle: No Joint: No Joint: No Joint: No Bone: No Bone: No Bone: No None None [name] Epithelialization: Wound Number: [id number] [name] Photos: [name] [name], [name] ([id number]) [id number] Page 5 of 14 Right, Lateral, Posterior Lower Leg Left, Lateral Lower Leg [name] Wound Location: Gradually Appeared Gradually Appeared [name] 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 [name] Primary Etiology: Arrhythmia, Hypertension, Gout Arrhythmia, Hypertension, Gout [name] Comorbid History: 09/10/2022 01/20/2022 [name] Date Acquired: 1 6 [name] Weeks of Treatment: Open Open [name] Wound Status: No No [name] Wound Recurrence: 0.1x0.1x0.1 4x2x0.1 [name] Measurements L x W x D (cm) 0.008 6.283 [name] [name] (cm) : rea 0.001 0.628 [name] Volume (cm) : 94.30% 64.20% [name] % Reduction in [name] rea: 92.90% 82.10% [name] % Reduction in  Volume: Full Thickness Without Exposed Full Thickness Without Exposed [name] Classification: Support Structures Support Structures Medium Large [name] Exudate Amount: Serous Serosanguineous [name] Exudate Type: amber red, brown [name] Exudate Color: No No [name] Foul Odor [name] Cleansing: fter [name] Odor Anticipated Due to Product Use: [name] Thickened [name] Wound Margin: Large (67-100%) Medium (34-66%) [name] Granulation [name] mount: Red Red, Hyper-granulation, Friable [name] Granulation Quality: None Present (0%) Medium (34-66%) [name] Necrotic Amount: Fat Layer (Subcutaneous Tissue): Yes Fat Layer (Subcutaneous Tissue): Yes [name] Exposed Structures: Fascia: No Tendon: No Muscle: No Joint: No Bone: No [name] Medium (34-66%) [name] Epithelialization: Treatment Notes Electronic Signature(s) Signed: 09/25/2022 4:45:28 PM By: [name] Entered By: [name] on 09/17/2022 10:20:14 -------------------------------------------------------------------------------- Multi-Disciplinary Care Plan Details Patient Name: Date of Service: [name] 09/17/2022 9:45 [name] M Medical Record Number: [id number] Patient Account Number: [id number] Date of Birth/Sex: Treating RN: [birth year] (76 y.o. [name]) [name] Primary Care Ercie Eliasen: [name] Other Clinician: Massie [name] Referring Smriti Barkow: Treating Beryle Zeitz/Extender: [name] in Treatment: 6 Active Inactive Necrotic Tissue Nursing Diagnoses: Impaired tissue integrity related to necrotic/devitalized tissue Knowledge deficit related to management of necrotic/devitalized tissue Goals: Necrotic/devitalized tissue will be minimized in the wound bed Date Initiated: 09/04/2022 Target Resolution Date: 09/27/2022 AYOMIDE, PURDY ([id number]) [id number] Page 6 of 14 Goal Status: Active Patient/caregiver will verbalize understanding of reason and process for debridement of necrotic tissue Date  Initiated: 09/04/2022 Date Inactivated: 09/10/2022 Target Resolution Date: 09/27/2022 Goal Status: Met Interventions: Assess patient pain level pre-, during and post procedure and prior to discharge Provide education on necrotic tissue and debridement process Treatment Activities: Apply topical anesthetic as ordered : 09/04/2022 Excisional debridement : 09/04/2022 Notes: Venous Leg Ulcer Nursing Diagnoses: Actual venous Insuffiency (use after diagnosis is confirmed) Knowledge deficit related to disease process and management Potential for venous Insuffiency (use before diagnosis confirmed) Goals: Non-invasive venous studies are completed as ordered Date Initiated: 09/04/2022 Target Resolution Date: 09/03/2022 Goal Status: Active Patient will maintain optimal edema control Date Initiated: 09/04/2022 Date Inactivated: 09/10/2022 Target Resolution Date: 10/04/2022 Goal Status: Met Patient/caregiver will verbalize understanding of disease process and disease management Date Initiated: 09/04/2022 Date Inactivated: 09/10/2022 Target Resolution Date: 10/04/2022 Goal Status: Met Verify adequate tissue perfusion prior to therapeutic compression application Date Initiated: 09/04/2022 Date Inactivated: 09/10/2022 Target Resolution Date: 10/04/2022 Goal Status: Met Interventions: Assess peripheral edema status every visit. Compression as ordered Provide education on venous insufficiency Treatment Activities: Therapeutic compression applied : 09/04/2022 Notes: Wound/Skin Impairment Nursing Diagnoses: Impaired  Wound Status Wound Number: 18 Primary Etiology: Venous Leg Ulcer Wound Location: Right, Lateral, Posterior Lower Leg Wound Status: Healed - Epithelialized Wounding Event: Gradually Appeared Comorbid History: Arrhythmia, Hypertension, Gout Date Acquired: 09/10/2022 Weeks Of Treatment: 1 Clustered Wound: No Photos Wound Measurements Length: (cm) Width: (cm) Depth: (cm) Area: (cm) Volume: (cm) 0 % Reduction in Area: 100% 0 % Reduction in Volume: 100% 0 Epithelialization: Large (67-100%) 0 0 Wound Description Classification: Full Thickness Without Exposed Support Exudate Amount: None Present Structures Foul Odor After Cleansing: No Slough/Fibrino Yes Wound Bed Granulation  Amount: None Present (0%) Exposed Structure Necrotic Amount: None Present (0%) Fat Layer (Subcutaneous Tissue) Exposed: Yes Treatment Notes Wound #18 (Lower Leg) Wound Laterality: Right, Lateral, Posterior Cleanser Peri-Wound Care Topical Primary Dressing Secondary Dressing Secured With Compression Wrap Compression Stockings Add-Ons Electronic Signature(s) Signed: 09/18/2022 4:34:43 PM By: [name] Coria RN Signed: 09/25/2022 4:45:28 PM By: [name] Entered By: [name] on 09/17/2022 10:38:05 [name] ([id number]) [id number] Page 13 of 14 -------------------------------------------------------------------------------- Wound Assessment Details Patient Name: Date of Service: [name] 09/17/2022 9:45 [name] M Medical Record Number: [id number] Patient Account Number: [id number] Date of Birth/Sex: Treating RN: 04-Dec-1945 (76 y.o. [name]) [name] Primary Care Klee Kolek: [name] Other Clinician: Massie [name] Referring Alyia Lacerte: Treating Nautia Lem/Extender: [name] in Treatment: 6 Wound Status Wound Number: 9 Primary Etiology: Venous Leg Ulcer Wound Location: Left, Lateral Lower Leg Wound Status: Open Wounding Event: Gradually Appeared Comorbid History: Arrhythmia, Hypertension, Gout Date Acquired: 01/20/2022 Weeks Of Treatment: 6 Clustered Wound: No Photos Wound Measurements Length: (cm) 4 Width: (cm) 2 Depth: (cm) 0.1 Area: (cm) 6.283 Volume: (cm) 0.628 % Reduction in Area: 64.2% % Reduction in Volume: 82.1% Epithelialization: Medium (34-66%) Wound Description Classification: Full Thickness Without Exposed Suppor Wound Margin: Thickened Exudate Amount: Large Exudate Type: Serosanguineous Exudate Color: red, brown t Structures Foul Odor After Cleansing: No Slough/Fibrino Yes Wound Bed Granulation Amount: Medium (34-66%) Exposed Structure Granulation Quality: Red, Hyper-granulation,  Friable Fascia Exposed: No Necrotic Amount: Medium (34-66%) Fat Layer (Subcutaneous Tissue) Exposed: Yes Necrotic Quality: Adherent Slough Tendon Exposed: No Muscle Exposed: No Joint Exposed: No Bone Exposed: No Electronic Signature(s) Signed: 09/18/2022 4:34:43 PM By: [name] Coria RN Signed: 09/25/2022 4:45:28 PM By: [name] Entered By: [name] on 09/17/2022 10:17:05 [name] ([id number]) [id number] Page 14 of 14 -------------------------------------------------------------------------------- Vitals Details Patient Name: Date of Service: [name] 09/17/2022 9:45 [name] M Medical Record Number: [id number] Patient Account Number: [id number] Date of Birth/Sex: Treating RN: 1946-06-06 (76 y.o. [name]) [name] Primary Care Reginna Sermeno: [name] Other Clinician: Massie [name] Referring Edon Hoadley: Treating Jariah Jarmon/Extender: [name] in Treatment: 6 Vital Signs Time Taken: 09:54 Temperature (F): 98.2 Height (in): 75 Pulse (bpm): 69 Weight (lbs): 225 Respiratory Rate (breaths/min): 16 Body Mass Index (BMI): 28.1 Blood Pressure (mmHg): 106/56 Reference Range: 80 - 120 mg / dl Electronic Signature(s) Signed: 09/25/2022 4:45:28 PM By: [name] Entered By: [name] on 09/17/2022 09:57:24

## 2022-09-25 NOTE — Progress Notes (Signed)
Cardiology Office Note   Date:  09/25/2022   ID:  [name], [name], MRN [id number]  PCP:  Birdie Sons, MD  Cardiologist:  Dr. Ellyn Hack  No chief complaint on file.     History of Present Illness: [name] is a 76 y.o. male who was referred by Melina Copa for evaluation and management of peripheral arterial disease.  He has known history of permanent atrial fibrillation, chronic diastolic heart failure, essential hypertension, chronic venous insufficiency, hyperlipidemia and peripheral arterial disease.  Previous cardiac catheterization in 2005 showed no significant coronary artery disease. He had recent issues of volume overload in the setting of suboptimal blood pressure control.  He had an echocardiogram earlier this month which showed an EF of 50 to 55%, severe biatrial enlargement, mild mitral regurgitation and moderate pulmonary hypertension. Recent carotid Doppler showed mild nonobstructive disease bilaterally with antegrade flow in both vertebral arteries and disturbed flow in the left subclavian artery with no significant stenosis.  He is known to have chronic venous insufficiency with stasis dermatitis and chronic venous wounds that has been treated at the wound center over the last year.  The wounds are usually below the knee and above the ankle and start as blisters.  He had venous Doppler studies done in the past that showed evidence of reflux but not a good candidate for ablation.  He denies lower extremity pain and no ulcerations of the toes.  He underwent recent noninvasive vascular studies which showed noncompressible vessels bilaterally but normal toe pressure.  Duplex showed no significant disease in the common femoral, profunda, SFA or popliteal arteries.  However, there was one-vessel runoff below the knee on the right side and two-vessel runoff on the left side.   Past Medical History:  Diagnosis Date   Basal cell carcinoma of right lower  extremity 03/24/2015   BPH with obstruction/lower urinary tract symptoms    Chronic atrial fibrillation (HCC)    Chronic heart failure with preserved ejection fraction (HFpEF) (HCC)    Dislocation of shoulder, anterior, right, closed 07/24/2014   Echocardiogram abnormal    2009 moderate to severely dilated left atrium   Erectile dysfunction    Essential hypertension    Gout    History of chicken pox    History of measles    Hyperglycemia    Hypogonadism in male    Nocturia    Peyronie's disease    Traumatic tear of right rotator cuff 07/24/2014   Venous stasis    chronic    Past Surgical History:  Procedure Laterality Date   CARDIAC CATHETERIZATION  03/28/2004   normal coronaries, reduced EF at 25-30% (Dr. Gerrie Nordmann)   CARDIOVERSION, TEE guided  03/29/2004   Dr. Gerrie Nordmann    COLONOSCOPY  05/28/2013   Dr. Rayann Heman   HERNIA REPAIR  age 76   KNEE SURGERY     LUMBAR FUSION  age 74   MOHS SURGERY Right 08/20/2019   ALA done by Ishmael Holter, MD [facility]   [place] CARDIOVASCULAR STRESS TEST  07/2003   anterior wall thinning, LV systolic function depressed at 33%   PILONIDAL CYST EXCISION     Right Eyebrow Tumor Removed  age 67    SHOULDER ARTHROSCOPY WITH ROTATOR CUFF REPAIR AND SUBACROMIAL DECOMPRESSION Right 08/19/2014   Procedure: SHOULDER ARTHROSCOPY WITH ROTATOR CUFF REPAIR AND SUBACROMIAL DECOMPRESSION;  Surgeon: Lorn Junes, MD;  Location: [facility];  Service: Orthopedics;  Laterality: Right;   TOTAL  HIP ARTHROPLASTY Right 03/25/2006   Dr. Wanda Plump. [place]   TRANSTHORACIC ECHOCARDIOGRAM  08/2008   EF=>55%, mild-mod dilated; LA mod-severely dilated; RA mild-mod dilated; mild calcif of MV, borderline MVP, trace MR; trace TR; mild pulm valve regurg      Current Outpatient Medications  Medication Sig Dispense Refill   carvedilol (COREG) 12.5 MG tablet TAKE 1 TABLET BY MOUTH TWICE A DAY 180 tablet 3   chlorthalidone (HYGROTON) 25 MG tablet Take 1 tablet (25 mg  total) by mouth daily. 90 tablet 2   furosemide (LASIX) 20 MG tablet Take 1 tablet (20 mg total) by mouth daily. 30 tablet 0   furosemide (LASIX) 20 MG tablet Take 1 tablet (20 mg total) by mouth daily. 90 tablet 3   magnesium citrate SOLN Take 1 Bottle by mouth once.     Multiple Vitamin (MULTIVITAMIN) tablet Take 1 tablet by mouth daily.     potassium chloride SA (KLOR-CON M) 20 MEQ tablet Take 2 tablets (40 mEq total) by mouth daily. 180 tablet 3   rivaroxaban (XARELTO) 20 MG TABS tablet Take 1 tablet (20 mg total) by mouth daily with supper. 90 tablet 4   rosuvastatin (CRESTOR) 10 MG tablet Take 1 tablet (10 mg total) by mouth daily. 90 tablet 3   valsartan (DIOVAN) 160 MG tablet Take 1 tablet (160 mg total) by mouth daily. 90 tablet 3   doxycycline (VIBRAMYCIN) 100 MG capsule Take 1 capsule (100 mg total) by mouth 2 (two) times daily. One po bid x 7 days 14 capsule 0   No current facility-administered medications for this visit.    Allergies:   Benazepril    Social History:  The patient  reports that he has never smoked. He has never used smokeless tobacco. He reports current alcohol use of about 4.0 standard drinks of alcohol per week. He reports that he does not use drugs.   Family History:  The patient's family history includes Arthritis in his sister; Bladder Cancer in his father; Diabetes in his maternal uncle and paternal grandfather; Hypertension in his father and mother; Prostate cancer in his father; Stroke in his maternal grandfather; Supraventricular tachycardia in his brother and child.    ROS:  Please see the history of present illness.   Otherwise, review of systems are positive for none.   All other systems are reviewed and negative.    PHYSICAL EXAM: VS:  BP 122/68   Pulse 72   Ht '6\' 3"'$  (1.905 m)   Wt 231 lb 12.8 oz (105.1 kg)   SpO2 94%   BMI 28.97 kg/m  , BMI Body mass index is 28.97 kg/m. GEN: Well nourished, well developed, in no acute distress  HEENT:  normal  Neck: no JVD, carotid bruits, or masses Cardiac: Irregularly irregular; no murmurs, rubs, or gallops, trace bilateral leg edema with chronic stasis dermatitis Respiratory:  clear to auscultation bilaterally, normal work of breathing GI: soft, nontender, nondistended, + BS MS: no deformity or atrophy  Skin: warm and dry, no rash Neuro:  Strength and sensation are intact Psych: euthymic mood, full affect Vascular: Femoral pulses normal bilaterally.  Distal pulses are +1.   EKG:  EKG is not ordered today.    Recent Labs: 08/09/2022: B Natriuretic Peptide 564.2 09/24/2022: ALT 22; BUN 13; Creatinine, Ser 0.93; Hemoglobin 12.6; NT-Pro BNP 854; Platelets 313; Potassium 4.0; Sodium 141; TSH 6.520    Lipid Panel    Component Value Date/Time   CHOL 187 09/24/2022 0934   TRIG  96 09/24/2022 0934   TRIG 65 01/06/2009 0000   HDL 58 09/24/2022 0934   CHOLHDL 3.2 09/24/2022 0934   CHOLHDL 3.9 03/03/2019 1154   VLDL 18 03/03/2019 1154   LDLCALC 112 (H) 09/24/2022 0934      Wt Readings from Last 3 Encounters:  09/25/22 231 lb 12.8 oz (105.1 kg)  08/15/22 227 lb 3.2 oz (103.1 kg)  08/14/22 228 lb (103.4 kg)           No data to display            ASSESSMENT AND PLAN:  1.  Peripheral arterial disease: Patient has evidence of peripheral arterial disease with calcified noncompressible vessels and some tibial disease below the knee.  Nonetheless, he seems to have normal toe pressure and no significant compromise in arterial flow.  Thus, no indication for revascularization.  He is not diabetic and does not smoke.  2.  Chronic diastolic heart failure: Volume overload improved after optimizing his medications.  He appears to be euvolemic and blood pressure is controlled.  3.  Permanent atrial fibrillation: Ventricular rate is controlled on carvedilol.  4.  Carotid disease: Overall mild.  5.  Hyperlipidemia: Recent lipid profile showed an LDL of 107.  Given that he has  underlying peripheral arterial disease, I recommend treatment with a statin and targeting an LDL level below 70.  I started rosuvastatin 10 mg daily.  Recheck lipid and liver profile in 2 months.  6.  Chronic venous insufficiency with ulceration: The wounds in his lower extremity are due to chronic venous insufficiency and not arterial insufficiency given the distribution.  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  Continue follow-up with the wound center.    Disposition:   FU with me in 6 months  Signed,  Kathlyn Sacramento, MD  09/25/2022 9:43 AM    [place]

## 2022-09-26 ENCOUNTER — Ambulatory Visit: Payer: Medicare PPO | Admitting: Cardiology

## 2022-09-26 ENCOUNTER — Encounter: Payer: Self-pay | Admitting: Cardiology

## 2022-09-26 VITALS — BP 136/81 | HR 50 | Ht 75.0 in | Wt 235.2 lb

## 2022-09-26 DIAGNOSIS — I739 Peripheral vascular disease, unspecified: Secondary | ICD-10-CM | POA: Diagnosis not present

## 2022-09-26 DIAGNOSIS — Z7901 Long term (current) use of anticoagulants: Secondary | ICD-10-CM

## 2022-09-26 DIAGNOSIS — I10 Essential (primary) hypertension: Secondary | ICD-10-CM | POA: Diagnosis not present

## 2022-09-26 DIAGNOSIS — E785 Hyperlipidemia, unspecified: Secondary | ICD-10-CM

## 2022-09-26 DIAGNOSIS — I4821 Permanent atrial fibrillation: Secondary | ICD-10-CM

## 2022-09-26 DIAGNOSIS — I428 Other cardiomyopathies: Secondary | ICD-10-CM | POA: Diagnosis not present

## 2022-09-26 DIAGNOSIS — I872 Venous insufficiency (chronic) (peripheral): Secondary | ICD-10-CM

## 2022-09-26 NOTE — Patient Instructions (Signed)
Medication Instructions:   No changes   *If you need a refill on your cardiac medications before your next appointment, please call your pharmacy*   Lab Work:Feb 2024 Fasting lipid CMP If you have labs (blood work) drawn today and your tests are completely normal, you will receive your results only by: [place] (if you have MyChart) OR A paper copy in the mail If you have any lab test that is abnormal or we need to change your treatment, we will call you to review the results.   Testing/Procedures:   Not needed  Follow-Up: At [facility], you and your health needs are our priority.  As part of our continuing mission to provide you with exceptional heart care, we have created designated Provider Care Teams.  These Care Teams include your primary Cardiologist (physician) and Advanced Practice Providers (APPs -  Physician Assistants and Nurse Practitioners) who all work together to provide you with the care you need, when you need it.     Your next appointment:   4 month(s)  The format for your next appointment:   In Person  Provider:   Glenetta Hew, MD

## 2022-09-26 NOTE — Progress Notes (Signed)
Primary Care Provider: Birdie Sons, MD [place] Cardiologist: Glenetta Hew, MD Electrophysiologist: None  Clinic Note: Chief Complaint  Patient presents with   Follow-up    1 month => overall feeling better   Atrial Fibrillation    Does not feel he is in or out of A-fib.  Rate controlled.   Hypertension    BP more stable now that he is back on his meds.   Edema    Notably improved edema with less prominent venous stasis dermatitis and no signs of infection.   ===================================  ASSESSMENT/PLAN   Problem List Items Addressed This Visit       Cardiology Problems   Permanent atrial fibrillation (HCC) CHA2DS2-VASc Score 3; On Xarelto - Primary (Chronic)    Rate controlled on current dose of carvedilol.  No signs of chronotropic incompetence.  Pretty much asymptomatic with being in A-fib. Continue rivaroxaban/Xarelto for stroke prophylaxis.      Relevant Orders   EKG 12-Lead (Completed)   Lipid panel   Comprehensive metabolic panel   Essential hypertension (Chronic)    His BP is usually well-controlled when he is taking his meds.  I added chlorthalidone last time I saw him and his pressures improved on valsartan, carvedilol and chlorthalidone.  As long as he takes his medications, his blood pressure is usually well-controlled.  For now we will make any changes, but need to follow renal function and potassium levels on combination of chlorthalidone and Lasix.  Low threshold to consider switching from chlorthalidone to spironolactone.      Peripheral vascular disease ([name]) (Chronic)    Started on statin for lipids.  Blood pressure controlled as noted. Seen by Dr. Fletcher Anon, no plans for invasive evaluation at this time.      NICM (nonischemic cardiomyopathy) ( [place]) (Chronic)    Initial EF at been dramatically decreased back in 2005, now back up into the more normal range.  Most recent echo just showed EF of 50 to 55% with mild global  HK. Unable to assess diastolic function because of A-fib but suspect that it probably is some with severe biatrial enlargement.  Plan: Continue current dose of carvedilol 12.5 mg twice daily along with valsartan 160 mg daily.  He is also chlorthalidone and now on standing dose of Lasix. Labs checked today showed stable potassium level, so we will need to continue to monitor, if potassium does go down to I do agree with switching from chlorthalidone to spironolactone.      Relevant Orders   EKG 12-Lead (Completed)   Hyperlipidemia with target LDL less than 100 (Chronic)    Labs were just checked today.  Would like to see the LDL less than 100.  However, with him having his memory issues and not actively on claudication or signs of significant obstructive disease, will hold off on being more aggressive.  He is reluctant to start any new medications, and I do not t want to increase to higher dose of rosuvastatin with his memory issues.      Relevant Orders   Lipid panel   Comprehensive metabolic panel     Other   Venous stasis dermatitis (Chronic)    He does have bilateral venous stasis.  Recommend foot elevation and compression stockings.      Long term current use of anticoagulant therapy (Chronic)    CHA2DS2-VASc score still 3 however close to being 4.  He is on standing dose of Xarelto with no major bleeding issues.  Okay  to hold Xarelto 1 to 3 days preop for surgical procedures: 1 day for low risk procedures,-vascular, dental 2 days for intermediate procedure: Tissue biopsies, colonoscopy/endoscopy 3 days prior procedures (organ biopsies, spinal procedures or major surgeries)        ===================================  HPI:    DONEVAN BILLER is a 76 y.o. male with a PMH with Permanent A-Fib (rate controlled) and history of NICM (EF back to 50-55%) who presents today for ~ 1 month f/u.  He returns today accompanied by his son.  They are here at the request of Birdie Sons,  MD.  I last saw LAMARKUS NEBEL in July 2022: This was a routine follow-up and he was doing pretty well.  Noted getting short of breath walking up an incline or upstairs but otherwise not with routine activity.  He noted palpitations only with exertion.  No chest pain or pressure with rest or exertion.  No PND or orthopnea with mild chronic edema.  Able to do chores off-and-on at the church, he tends to do the maintenance and yard work for [employer] as well as his house and neighbors.  No exertional dyspnea.  Occasional dizziness.   => No changes made.  Recent Hospitalizations:  11/2-3/21023 [facility] ER: Presented with hypertension noted in his PCPs office earlier that day.  He was being evaluated for chronic leg wounds.  Apparently worse on the left leg.  Little more swollen with more drainage that was foul-smelling.  Wounds were dressed at the wound clinic, and he was have significant elevated BP at his PCPs office.  He did note some exertional dyspnea but not any worse than usual.  Complains of coughing but no fevers.Noted edema is much worse. Noted to have bilateral lower extremity edema chronic stasis.  Changes.  There is ulceration to the layer of skin but no induration or fluctuation. With IV Lasix and told to take daily Lasix.  Chrstopher was last seen on August 15, 2022 by Melina Copa, PA for hospital follow-up-accompanied by his son.  They have noted that he apparently had not been taking his medications (most notably blood pressure meds) as prescribed for about a week.  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  Sinus try to take over ensuring that I contracts.  Was noted to be notably improved being back on meds.  He had only been taking PRN Lasix prior to the hospital stay, and was started on home standing dose of 20 mg daily on discharge.  Still has generalized fatigue but no chest pain or pressure.  Recheck BP was 130 over 72 mmHg. Continued on home dose Lasix with  labs checked.-If potassium noted to be reduced, would potentially consider switching chlorthalidone to spironolactone.  Also discussed sodium and fluid restriction. 2D Echo and Carotid Dopplers along with ABIs ordered  He was just seen by Dr. Fletcher Anon yesterday in follow-up for noninvasive testing showed noncompressible vessels bilaterally but normal TBI's.  No significant disease in the common femoral and profunda as well as SFA popliteal arteries.  One-vessel runoff below the knee on the right side and 2 on the left.  No indication for attempted revascularization.  Reviewed  CV studies:    The following studies were reviewed today: (if available, images/films reviewed: From Epic Chart or Care Everywhere) TTE 09/11/2022 : LV EF 50 to 55%.  Normal function with no RWMA.  Unable to assess DD - Afib.  Mildly reduced RV function moderately enlarged RV and moderately  elevated PAP.  Severe biatrial enlargement.  Mild MR.  Normal RAP.:  Interval History:   ZARION OLIFF presents here today with his son.  Maggie is clearly not as clear of his history what medicines he is taking his he had the past.  Clearly showed some signs of early dementia.  He acknowledges that he probably had not been taking his blood pressure medicines correctly for the few weeks leading up to his ER visit.  Now that he is back on his medicines, his blood pressure is much better controlled.  His swelling is also notably improved although there is still some chronic stasis changes but symptoms seem to be healing up.  He has just completed his course of doxycycline.  He denies any PND, orthopnea to go along with edema.  No more than usual exertional dyspnea although he is somewhat more debilitated than he had been-still notes getting dyspneic going up stairs or incline.Marland Kitchen  He denies any chest pain or pressure with rest or exertion.  No sense of being in A-fib.  He still does his routine chores, but does not able to go as long as he used to  be able to without stopping.  No fevers or chills.  Cough is now gone.  No palpitations or irregular heartbeats.  No syncope or near syncope, no TIA or amaurosis fugax.  CV Review of Symptoms (Summary): positive for - dyspnea on exertion, edema, and overall improving symptoms.  Exertional dyspnea is back to baseline and edema notably improved. negative for - chest pain, irregular heartbeat, orthopnea, palpitations, paroxysmal nocturnal dyspnea, rapid heart rate, or syncope or near syncope, TIA/amaurosis fugax, or claudication.  REVIEWED OF SYSTEMS   Review of Systems  Constitutional:  Positive for malaise/fatigue (He does not have much energy that he used to have.  Slowing down some.  But still does his chores.). Negative for weight loss (Weight is actually up).  HENT:  Negative for congestion and nosebleeds.   Respiratory:  Positive for shortness of breath (Only with exertion). Negative for cough and wheezing.   Cardiovascular:        HPI  Gastrointestinal:  Negative for abdominal pain, blood in stool and melena.  Genitourinary:  Negative for hematuria.  Musculoskeletal:  Positive for joint pain. Negative for falls and myalgias.  Neurological:  Positive for dizziness (Some vertigo). Negative for focal weakness, weakness and headaches.  Endo/Heme/Allergies:  Positive for environmental allergies. Bruises/bleeds easily.  Psychiatric/Behavioral:  Positive for memory loss. Negative for depression. The patient is not nervous/anxious and does not have insomnia.   All other systems reviewed and are negative.  I have reviewed and (if needed) personally updated the patient's problem list, medications, allergies, past medical and surgical history, social and family history.   PAST MEDICAL HISTORY   Past Medical History:  Diagnosis Date   Basal cell carcinoma of right lower extremity 03/24/2015   BPH with obstruction/lower urinary tract symptoms    Chronic atrial fibrillation (HCC)    Chronic  heart failure with preserved ejection fraction (HFpEF) (HCC)    Dislocation of shoulder, anterior, right, closed 07/24/2014   Echocardiogram abnormal    2009 moderate to severely dilated left atrium   Erectile dysfunction    Essential hypertension    Gout    History of chicken pox    History of measles    Hyperglycemia    Hypogonadism in male    Nocturia    Peyronie's disease    Traumatic tear of right rotator  cuff 07/24/2014   Venous stasis    chronic    PAST SURGICAL HISTORY   Past Surgical History:  Procedure Laterality Date   CARDIAC CATHETERIZATION  03/28/2004   normal coronaries, reduced EF at 25-30% (Dr. Gerrie Nordmann)   CARDIOVERSION, TEE guided  03/29/2004   Dr. Gerrie Nordmann    COLONOSCOPY  05/28/2013   Dr. Rayann Heman   HERNIA REPAIR  age 73   [place]  age 42   MOHS SURGERY Right 08/20/2019   ALA done by Ishmael Holter, MD [facility]   MYOVIEW CARDIOVASCULAR STRESS TEST  07/2003   anterior wall thinning, LV systolic function depressed at 33%   PILONIDAL CYST EXCISION     Right Eyebrow Tumor Removed  age 88    SHOULDER ARTHROSCOPY WITH ROTATOR CUFF REPAIR AND SUBACROMIAL DECOMPRESSION Right 08/19/2014   Procedure: SHOULDER ARTHROSCOPY WITH ROTATOR CUFF REPAIR AND SUBACROMIAL DECOMPRESSION;  Surgeon: Lorn Junes, MD;  Location: [facility];  Service: Orthopedics;  Laterality: Right;   TOTAL HIP ARTHROPLASTY Right 03/25/2006   Dr. Wanda Plump. [place]   TRANSTHORACIC ECHOCARDIOGRAM  09/11/2022   LV EF 50 to 55%.  Normal function with no RWMA.  Unable to assess DD - Afib.  Mildly reduced RV function moderately enlarged RV and moderately elevated PAP.  Severe biatrial enlargement.  Mild MR.  Normal RAP.:    Immunization History  Administered Date(s) Administered   Fluad Quad(high Dose 65+) 06/22/2019   Influenza, High Dose Seasonal PF 06/08/2020, 07/07/2021   Influenza-Unspecified 06/25/2017, 06/22/2018   PFIZER(Purple Top)SARS-COV-2 Vaccination  11/23/2019, 12/14/2019, 07/19/2020   Pneumococcal Conjugate-13 09/20/2014   Pneumococcal Polysaccharide-23 09/26/2015   Tdap 03/27/2011   Zoster Recombinat (Shingrix) 10/14/2018, 12/22/2018   Zoster, Live 03/24/2010    MEDICATIONS/ALLERGIES   Current Meds  Medication Sig   carvedilol (COREG) 12.5 MG tablet TAKE 1 TABLET BY MOUTH TWICE A DAY   chlorthalidone (HYGROTON) 25 MG tablet Take 1 tablet (25 mg total) by mouth daily.   furosemide (LASIX) 20 MG tablet Take 1 tablet (20 mg total) by mouth daily.   Multiple Vitamin (MULTIVITAMIN) tablet Take 1 tablet by mouth daily.   potassium chloride SA (KLOR-CON M) 20 MEQ tablet Take 2 tablets (40 mEq total) by mouth daily.   rivaroxaban (XARELTO) 20 MG TABS tablet Take 1 tablet (20 mg total) by mouth daily with supper.   rosuvastatin (CRESTOR) 10 MG tablet Take 1 tablet (10 mg total) by mouth daily.   valsartan (DIOVAN) 160 MG tablet Take 1 tablet (160 mg total) by mouth daily.    Allergies  Allergen Reactions   Benazepril     COUGH     SOCIAL HISTORY/FAMILY HISTORY   Reviewed in Epic:  Pertinent findings:  Social History   Tobacco Use   Smoking status: Never   Smokeless tobacco: Never  Vaping Use   Vaping Use: Never used  Substance Use Topics   Alcohol use: Yes    Alcohol/week: 4.0 standard drinks of alcohol    Types: 4 Cans of beer per week   Drug use: No   Social History   Social History Narrative   Divorced father of 1. Retired Education officer, museum.   ~2 beers / day. Does not smoke.   Works out 6 days a weeks - cardio & weights. He used to run and play basketball prior to his hip surgery in 2007.   Currently dating.    OBJCTIVE -PE, EKG, labs   Wt Readings  from Last 3 Encounters:  09/26/22 235 lb 3.2 oz (106.7 kg)  09/25/22 231 lb 12.8 oz (105.1 kg)  08/15/22 227 lb 3.2 oz (103.1 kg)    Physical Exam: BP 136/81 (BP Location: Left Arm, Patient Position: Sitting, Cuff Size: Normal)   Pulse (!) 50   Ht '6\' 3"'$  (1.905  m)   Wt 235 lb 3.2 oz (106.7 kg)   SpO2 98%   BMI 29.40 kg/m  Physical Exam Vitals reviewed.  Constitutional:      General: He is not in acute distress.    Appearance: Normal appearance. He is obese. He is not ill-appearing or toxic-appearing.     Comments: Borderline obese but otherwise well-groomed.  Well-nourished.  HENT:     Head: Normocephalic and atraumatic.  Neck:     Vascular: No carotid bruit or JVD.  Cardiovascular:     Rate and Rhythm: Normal rate. Rhythm irregularly irregular.     Chest Wall: PMI is not displaced.     Pulses: Intact distal pulses.     Heart sounds: S1 normal and S2 normal. Heart sounds are distant. No murmur heard.    No friction rub. No gallop.  Musculoskeletal:     Cervical back: Normal range of motion and neck supple.  Skin:    General: Skin is warm and dry.     Findings: Erythema (Bilateral (left more than right venous stasis changes.  No rubor or calor.) present.  Neurological:     General: No focal deficit present.     Mental Status: He is alert and oriented to person, place, and time.     Motor: No weakness.     Gait: Gait abnormal.     Comments: Notably less coherent.  More unclear historian.  Psychiatric:        Mood and Affect: Mood normal.        Behavior: Behavior normal.     Comments: Does not is clear with his talking.  More slow referring to his son     Adult ECG Report  Rate: 50;  Rhythm: atrial fibrillation and slow ventricular response. ;  Nonspecific IVCD. CRO Ant MI, age indeterminate  Narrative Interpretation: Stable  Recent Labs: Drawn today-reviewed Lab Results  Component Value Date   CHOL 187 09/24/2022   HDL 58 09/24/2022   LDLCALC 112 (H) 09/24/2022   TRIG 96 09/24/2022   CHOLHDL 3.2 09/24/2022   Lab Results  Component Value Date   CREATININE 0.93 09/24/2022   BUN 13 09/24/2022   NA 141 09/24/2022   K 4.0 09/24/2022   CL 98 09/24/2022   CO2 28 09/24/2022      Latest Ref Rng & Units 09/24/2022    9:37  AM 08/09/2022    7:15 PM 11/22/2021   11:50 AM  CBC  WBC 3.4 - 10.8 x10E3/uL 6.1  8.3  7.7   Hemoglobin 13.0 - 17.7 g/dL 12.6  13.0  14.3   Hematocrit 37.5 - 51.0 % 37.8  39.3  40.5   Platelets 150 - 450 x10E3/uL 313  285  266     Lab Results  Component Value Date   HGBA1C 6.2 (H) 09/24/2022   Lab Results  Component Value Date   TSH 6.520 (H) 09/24/2022    ================================================== I spent a total of 33 minutes with the patient spent in direct patient consultation.  Additional time spent with chart review  / charting (studies, outside notes, etc): 14 min Total Time: 47 min  Current medicines are  reviewed at length with the patient today.  (+/- concerns) n/a  Notice: This dictation was prepared with Dragon dictation along with smart phrase technology. Any transcriptional errors that result from this process are unintentional and may not be corrected upon review.  Studies Ordered:   Orders Placed This Encounter  Procedures   Lipid panel   Comprehensive metabolic panel   EKG 37-DSKA   No orders of the defined types were placed in this encounter.   Patient Instructions / Medication Changes & Studies & Tests Ordered   Patient Instructions  Medication Instructions:   No changes   *If you need a refill on your cardiac medications before your next appointment, please call your pharmacy*   Lab Work:Feb 2024 Fasting lipid CMP If you have labs (blood work) drawn today and your tests are completely normal, you will receive your results only by: [place] (if you have MyChart) OR A paper copy in the mail If you have any lab test that is abnormal or we need to change your treatment, we will call you to review the results.   Testing/Procedures:   Not needed  Follow-Up: At [facility], you and your health needs are our priority.  As part of our continuing mission to provide you with exceptional heart care, we have created designated  Provider Care Teams.  These Care Teams include your primary Cardiologist (physician) and Advanced Practice Providers (APPs -  Physician Assistants and Nurse Practitioners) who all work together to provide you with the care you need, when you need it.     Your next appointment:   4 month(s)  The format for your next appointment:   In Person  Provider:   Glenetta Hew, MD       Leonie Man, MD, MS Glenetta Hew, M.D., M.S. Interventional Cardiologist  [place]  Pager # [phone] Phone # [phone] [address]. [place], [place] [address]   Thank you for choosing [facility]!!

## 2022-09-27 ENCOUNTER — Telehealth: Payer: Self-pay | Admitting: *Deleted

## 2022-09-27 NOTE — Telephone Encounter (Signed)
Left voicemail message to call back for review of results.  

## 2022-09-27 NOTE — Telephone Encounter (Signed)
-----   Message from Leonie Man, MD sent at 09/25/2022 11:25 PM EST ----- LDL is gone the wrong way.  Total cholesterol is back to what it was a year ago not 10 months ago.  We need to figure out what organ to do to treat cholesterol levels.  It looks like Dr. Velva Harman started you on rosuvastatin 10 mg daily.  Lets see how that does. We can recheck labs in 3-4 months.  [place]

## 2022-10-02 ENCOUNTER — Encounter: Payer: Self-pay | Admitting: Cardiology

## 2022-10-02 ENCOUNTER — Encounter: Payer: Medicare PPO | Admitting: Internal Medicine

## 2022-10-02 ENCOUNTER — Other Ambulatory Visit: Payer: Self-pay

## 2022-10-02 ENCOUNTER — Encounter: Payer: Self-pay | Admitting: Family Medicine

## 2022-10-02 ENCOUNTER — Telehealth: Payer: Self-pay | Admitting: Family Medicine

## 2022-10-02 DIAGNOSIS — E039 Hypothyroidism, unspecified: Secondary | ICD-10-CM

## 2022-10-02 DIAGNOSIS — L97822 Non-pressure chronic ulcer of other part of left lower leg with fat layer exposed: Secondary | ICD-10-CM | POA: Diagnosis not present

## 2022-10-02 DIAGNOSIS — S81802A Unspecified open wound, left lower leg, initial encounter: Secondary | ICD-10-CM | POA: Diagnosis not present

## 2022-10-02 DIAGNOSIS — I10 Essential (primary) hypertension: Secondary | ICD-10-CM | POA: Diagnosis not present

## 2022-10-02 DIAGNOSIS — I480 Paroxysmal atrial fibrillation: Secondary | ICD-10-CM | POA: Diagnosis not present

## 2022-10-02 DIAGNOSIS — I872 Venous insufficiency (chronic) (peripheral): Secondary | ICD-10-CM | POA: Diagnosis not present

## 2022-10-02 DIAGNOSIS — Z7901 Long term (current) use of anticoagulants: Secondary | ICD-10-CM | POA: Diagnosis not present

## 2022-10-02 DIAGNOSIS — I87333 Chronic venous hypertension (idiopathic) with ulcer and inflammation of bilateral lower extremity: Secondary | ICD-10-CM | POA: Diagnosis not present

## 2022-10-02 DIAGNOSIS — I428 Other cardiomyopathies: Secondary | ICD-10-CM | POA: Diagnosis not present

## 2022-10-02 DIAGNOSIS — L97812 Non-pressure chronic ulcer of other part of right lower leg with fat layer exposed: Secondary | ICD-10-CM | POA: Diagnosis not present

## 2022-10-02 DIAGNOSIS — Z79899 Other long term (current) drug therapy: Secondary | ICD-10-CM

## 2022-10-02 DIAGNOSIS — I739 Peripheral vascular disease, unspecified: Secondary | ICD-10-CM | POA: Diagnosis not present

## 2022-10-02 MED ORDER — LEVOTHYROXINE SODIUM 50 MCG PO TABS: 50.0000 ug | ORAL_TABLET | Freq: Every day | ORAL | 1 refills | Status: DC

## 2022-10-02 NOTE — Telephone Encounter (Signed)
Replied to patient on mychart

## 2022-10-02 NOTE — Telephone Encounter (Signed)
Left message to call back  

## 2022-10-02 NOTE — Progress Notes (Signed)
[name], [name] ([id number]) [id number] Page 1 of 8 Visit Report for 10/02/2022 HPI Details Patient Name: Date of Service: [name] 10/02/2022 11:30 [name] Medical Record Number: [id number] Patient Account Number: [id number] Date of Birth/Sex: Treating RN: 1946/04/03 (76 y.o. [name] Primary Care Provider: Lelon [name] Other Clinician: Referring Provider: Treating Provider/Extender: [name], [name] [name] in Treatment: 8 History of Present Illness HPI Description: 76 year old male who has [name] past medical history of essential hypertension, chronic atrial fibrillation, peripheral vascular disease, nonischemic cardiomyopathy,venous stasis dermatitis, gouty arthropathy, basal cell carcinoma of the right lower extremity, benign prostatic hypertrophy, long- term use of anticoagulation therapy, hyperglycemia and exercise intolerance has never been [name] smoker. the patient has had [name] vascular workup over 7 years ago and said everything was normal at that stage. He does not have any chronic problems except for cardiac issues which he sees [name] cardiologist in [place]. 08/15/2017 -- arterial and venous duplex studies still pending. 08/23/2017 -- venous reflux studies done on 08/13/2017 shows venous incompetence throughout the left lower extremity deep system and focally at the left saphenofemoral junction. 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 No evidence of SVT or DVT in bilateral lower extremities The patient has an appointment at the end of the month to get his arterial duplex study done 09/05/2017 -- the patient was seen at the vein and vascular office yesterday by [name] ABI studies were notable for medial calcification and the toe brachial indices were normal and bilateral ankle-brachial) waveforms were normal with triphasic flow. After review of his venous studies he was not [name] candidate for laser  ablation and his lymphedema was to be treated with compression stockings and lymphedema pump pumps 09/12/2017 -- had [name] low arterial study done at the [facility] -- unable to obtain reliable ABI is due to medial calcification. Bilateral toe indices were normal with the right being 1.01 and the left being 0.92 and the waveforms were triphasic bilaterally. he did get hold of 30-40 mm compression stockings but is unable to put these on. We will try and get him alternative compression stockings. 09/26/17- he is here in follow up evaluation of [name] right lower extremity ulcer;he is compliant in wearing compression stocking; ulcer almost epithelialized , anticipate healing next appointment Readmission: 11/17 point upon evaluation patient's wound currently that he is seeing [place] for today is [name] skin cancerous lesion that was cleared away by his dermatologist on the left medial calf region. He tells me that this is [name] very similar thing to what he had done previously in fact the last time he saw him in 2018 this was also what was going on at that point. Nonetheless he feels that based on what he seeing currently that this is just having [name] lot of harder time healing although it is much closer to the surface than what he is experienced in the past. He notes that the initial removal was in June 2022 which was this year this is now November and still has not closed. He does have some edema and definitely I think that there is some venous component to his slow healing here. Also think that we can do something better than Vaseline to try to help with getting this to clear up as quickly as possible. He does have [name] history of atrial fibrillation and is on Eliquis otherwise he really has no major medical problems that would affect  wound healing. 09/07/2021 upon evaluation today patient actually appears to be doing significantly better after having wrapped him last week. Overall I think that this is  making significant improvements at this time which is great news. I do not see any evidence of infection which is great news as well. No fevers, chills, nausea, vomiting, or diarrhea. 09/14/2021 upon evaluation today patient appears to be doing well with regard to his leg ulcer. He has been tolerating the dressing changes and overall I think that he is making excellent progress. I do not see any signs of active infection at this time. 09/21/2021 upon evaluation today patient actually appears to be making good progress with regard to his wound this is again measuring smaller today no debridement seems to be necessary. We have been using [name] silver collagen dressing and I think that is doing an awesome job. 09/28/2021 upon evaluation today patient appears to be doing well with regard to his leg currently. I do not see any signs of active infection at this time which is great news. No fevers, chills, nausea, vomiting, or diarrhea. I think this wound is very close to complete resolution. 10/12/2021 upon evaluation today patient actually appears to be doing awesome in regard to his leg ulcer. In fact this appears to be completely healed based on what I am seeing currently. I do not see any evidence of active infection locally nor systemically at this time which is also great news. No fevers, chills, nausea, vomiting, or diarrhea. Readmission: 12/07/2021 upon evaluation today patient presents for readmission here in the clinic. He was discharged on 10/12/2021 is completely healed. Unfortunately this has reopened at this point and he is having continual issues with new blisters over both lower extremities. This is even worse than what we previously saw. Nonetheless we did actually check his ABIs today and it did reveal that his ABIs were 0.55 on the left and 0.57 on the right. Subsequently this is [name] definite change from his last arterial study which showed that he did have good blood flow at 1.01 on the right and  0.92 on the left and that was right at the beginning of 2019. Nonetheless based on what we see currently I do think he tolerated the 3 layer compression wrap but I do believe that we probably need to get him tested for his arterial flow in order to see where things stand and if there is something we can do there that would help prevent this from continue to be an [name], [name] ([id number]) [id number] Page 2 of 8 ongoing issue. He did not utilize compression socks in the interim from when he was last here till this time. That something is probably going to need lifelong going forward as well. 3/9; patient presents for follow-up. He has no issues or complaints today. He tolerated the compression wrap well. He had ABIs with TBI's done. He denies signs of infection. 12/21/2021 upon evaluation today patient appears to be doing well with regard to the wounds on his legs. Both are showing signs of significant improvement which is great news although I do believe some sharp debridement would be of benefit here as well. 12/28/2021 upon evaluation today patient appears to be doing well with regard to his wounds. Everything is showing signs of excellent improvement which I am very pleased about. I think that we are headed in the right direction here. Fortunately there does not appear to be any evidence of infection which is great news there is  [name] little bit of hypergranulation. 01/04/2022 upon evaluation today patient appears to be doing well with regard to his wounds 2 of them are healed 1 is almost so and the other 1 is significantly better. Overall I am extremely pleased with where we stand and I think that he is making excellent progress here. I do not see any evidence of active infection locally nor systemically at this time. 01-16-2022 upon evaluation today patient's wound on the left leg is showing signs of doing quite well. Has not completely cleared at this point but it is  much improved. Fortunately I do not see any signs of infection at this time. No fevers, chills, nausea, vomiting, or diarrhea. 01-23-2022 upon evaluation today patient's wound of the left leg actually appears to be pretty much completely healed which is great news. I do not see any signs of active infection locally or systemically which is excellent. With that being said on the right leg what wound is measuring smaller the other 1 is [name] new wound that just showed up fortunately its not too bad. Has been using Xeroform here and that seems to be doing decently well which is great news. Unfortunately his blood pressure is significantly high we gave him the readings for the past 4-5 visits as well as [name] recommendation to make an appointment to go discuss this with his primary care provider patient states that he is going to look into doing this. 01-30-2022 upon evaluation today patient appears to be doing well with regard to his left leg everything appears to be healed. On the right leg the more anterior wound is healed the more medial wound that I been concerned about [name] possible skin cancer unfortunately still does not look great to me. I do believe that we should probably do [name] biopsy I have talked about it with him [name] few times I think though it is probably time to go ahead and do this at this point. 02-09-2022 upon evaluation today patient appears to be doing well with regard to his legs. On the left this appears to be completely healed. On the right he does have 2 areas and be perfectly honest one of them is [name] skin cancer that he is going to the Mohs surgery clinic for the other seems to be healing nicely. Readmission: 08-02-2022 upon evaluation today patient appears for reevaluation here in our clinic concerning issues that he has been having with wounds over the bilateral lower extremities. I last saw him in May 2023 and at that point we had him completely healed. Unfortunately he is tells me this has  broken down to some degree since that point. 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 He tells me this 1 seems to just be getting bigger and bigger not improving. Fortunately he is not having any significant pain which is good news he does have quite [name] bit of swelling and he tells me that his fluid pills are not recommended for him to take daily but just in 3-day intervals here and there. 08-09-2022 upon evaluation today patient appears to be doing still somewhat poorly in regard to his legs although in general he does not appear to be feeling as good as he has been. Fortunately there does not appear to be any  signs of infection which is good news. With that being said he is having some issues here with having and overall poor feeling in general which again is good I think going to be the biggest complicating factor. He actually seems to be coughing I do not hear any wheezing right now I did listen to his chest he did not have good airflow down low however makes me suspicious for bronchitis or even possibly pneumonia which could be part of what is going on here as well. Fortunately I do not see any evidence of anything worsening in regard to his legs but I definitely believe that he needs to continue with the compression wraps he took them off yesterday to shower has not had anything on for 24 hours that is why his legs are so swollen today. With regard to his pathology report I did review that it showed some squamous abnormality but no signs of distinct carcinoma. With that being said it was saying that it could be adjacent to [name] squamous cell carcinoma nonetheless my suggestion is can be that we have the patient take copy of this report and give it to his Mohs surgeon in order for them to see if there  is anything they feel like needs to be done further. With that being said right now I feel like the primary thing is going to be for [place] to try to get his swelling down and keep that down into that hand since he is having so much drainage I believe we can have to bring him in for dressing changes twice [name] week doing [name] nurse visit on Mondays. 11/9; since the patient was last here he spent the night in the emergency room he received IV Lasix. Also received antibiotics although he was not discharged on either 1 of these. He also saw his cardiology office who put him on regular Lasix 20 mg [previously on as needed Lasix 20 mg]. Per our intake nurse the swelling in his legs is remarkably better but he still has bilateral lower extremity wounds. He still has wounds on the bilateral lower extremities most problematically on the left lateral calf. He has been using silver alginate under 3 layer compression. 08-23-2022 upon evaluation today patient appears to be doing much better than the last time I saw him 2 weeks ago. At that point I was very concerned about how he was doing he did see Dr. Caryn Section his primary care provider they got him on some blood pressure medication in general his color and overall appearance looks to be doing much improved compared to the last time I saw him. 09-04-2022 upon evaluation today patient appears to be doing well currently in regard to his wounds. Everything is showing signs of improvement which is great news. Fortunately there does not appear to be any signs of active infection locally or systemically at this time. No fevers, chills, nausea, vomiting, or diarrhea. 09-10-2022 upon evaluation today patient appears to be doing better in regard to his wounds although the [facility] was extremely stuck to the wound bed. Fortunately there does not appear to be any signs of infection locally or systemically at this time which is great news. No fevers, chills, nausea, vomiting,  or diarrhea. 09-17-2022 upon evaluation today patient appears to be doing well currently in regard to his wounds in general. The right leg actually showing signs of excellent improvement and very pleased with where things stand in that regard. Fortunately I do not see any evidence  of infection locally or systemically at this time which is great news. No fevers, chills, nausea, vomiting, or diarrhea. 09-24-2022 upon evaluation today patient appears to be doing well currently in regard to his wounds. Things look to be doing quite well. 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 He is seeing his dermatologist tomorrow in that regard. With that being said other than that however he seems to really be making some pretty good progress here which is good news. No fevers, chills, nausea, vomiting, or diarrhea. 12/26; the patient has 2 open wounds remaining on the left leg. One is on the left anterior mid tibia and the other is on the right lateral knee just outside of the popliteal fossa. 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 The patient has been to see dermatology Dr. Evorn Gong who apparently is making him [name] referral to the [facility] Mohs surgery center. He does not yet have an appointment Electronic Signature(s) Signed: 10/02/2022 4:30:46 PM By: Linton Ham MD Entered By: Linton Ham on 10/02/2022 12:13:37 [name], [name] ([id number]) [id number] Page 3 of 8 -------------------------------------------------------------------------------- Physical Exam Details Patient Name: Date of Service: [name] [place]. 10/02/2022 11:30 [name] Medical Record Number: [id number] Patient Account Number: [id number] Date of Birth/Sex: Treating RN: 1945-10-27 (76 y.o. [name] Primary Care Provider: Lelon [name] Other Clinician: Referring Provider: Treating Provider/Extender: [name], [name] [name] in Treatment: 8 Constitutional Sitting or standing Blood Pressure is within target range for patient.. Pulse regular and within target range for patient.Marland Kitchen Respirations regular, non-labored and within target range.. Temperature is normal and within the target range for the patient.Marland Kitchen appears in no distress. Cardiovascular He has edema where the upper part of his Tubigrip what wrap would have been.. Notes Wound exam; the area anteriorly in the mid tibia is superficial. This looks like it will close. He has severe flaking dry skin on his left leg in its entirety. The area on the left medial knee area also looks healthy. This is where the malignancy was diagnosed by biopsy Electronic Signature(s) Signed: 10/02/2022 4:30:46 PM By: Linton Ham MD Entered By: Linton Ham on 10/02/2022 12:15:00 -------------------------------------------------------------------------------- Physician Orders Details Patient Name: Date of Service: [name] [name], [name] 10/02/2022 11:30 [name] Medical Record Number: [id number] Patient Account Number: [id number] Date of Birth/Sex: Treating RN: 07-29-1946 (76 y.o. [name] Primary Care Provider: Lelon [name] Other Clinician: Referring Provider: Treating Provider/Extender: [name], [name] [name] in Treatment: 8 Verbal / Phone Orders: No Diagnosis Coding ICD-10 Coding Code Description [phone] Chronic venous hypertension (idiopathic) with ulcer and inflammation of bilateral lower extremity L97.822 Non-pressure chronic ulcer of other part of left lower leg with fat layer exposed L97.812 Non-pressure chronic ulcer of other part of right lower leg with fat layer exposed I73.89 Other specified peripheral vascular diseases I48.0 Paroxysmal atrial fibrillation I10 Essential (primary) hypertension [name], [name] ([id number])  [id number] Page 4 of 8 Follow-up Appointments Return Appointment in 1 week. Bathing/ Shower/ Hygiene May shower with wound dressing protected with water repellent cover or cast protector. No tub bath. Anesthetic (Use 'Patient Medications' Section for Anesthetic Order Entry) Wound #9 Left,Lateral Lower Leg Lidocaine applied to wound bed Edema Control - Lymphedema / Segmental Compressive Device / Other Bilateral Lower Extremities  3 Layer Compression System for Lymphedema. - left lower leg Patient to wear own compression stockings. Remove compression stockings every night before going to bed and put on every morning when getting up. - right lower leg Elevate, Exercise Daily and [name] void Standing for Long Periods of Time. Elevate legs to the level of the heart and pump ankles as often as possible Elevate leg(s) parallel to the floor when sitting. DO YOUR BEST to sleep in the bed at night. DO NOT sleep in your recliner. Long hours of sitting in [name] recliner leads to swelling of the legs and/or potential wounds on your backside. Additional Orders / Instructions Follow Nutritious Diet and Increase Protein Intake Wound Treatment Wound #15 - Lower Leg Wound Laterality: Left, Posterior Prim Dressing: Silvercel Small 2x2 (in/in) 1 x Per Day/30 Days ary Discharge Instructions: Apply Silvercel Small 2x2 (in/in) as instructed Secondary Dressing: Coverlet Latex-Free Fabric Adhesive Dressings 1 x Per Day/30 Days Discharge Instructions: 1.5 x 2 Wound #9 - Lower Leg Wound Laterality: Left, Lateral Prim Dressing: Silvercel Small 2x2 (in/in) 1 x Per Week/30 Days ary Discharge Instructions: Apply Silvercel Small 2x2 (in/in) as instructed Secondary Dressing: Zetuvit Plus 4x8 (in/in) 1 x Per Week/30 Days Secured With: [employer] Sterile or Non-Sterile 6-ply 4.5x4 (yd/yd) 1 x Per Week/30 Days Discharge Instructions: Apply Kerlix as directed Compression Wrap: 3-LAYER WRAP -  Profore Lite LF 3 Multilayer Compression Bandaging System 1 x Per Week/30 Days Discharge Instructions: Apply 3 multi-layer wrap as prescribed. Electronic Signature(s) Signed: 10/02/2022 1:07:53 PM By: [name] MSN RN CNS WTA Signed: 10/02/2022 4:30:46 PM By: Linton Ham MD Entered By: [name] on 10/02/2022 13:07:53 -------------------------------------------------------------------------------- Problem List Details Patient Name: Date of Service: [name] [name], [name] 10/02/2022 11:30 [name] Medical Record Number: [id number] Patient Account Number: [id number] Date of Birth/Sex: Treating RN: 05/24/1946 (76 y.o. [name] Primary Care Provider: Lelon [name] Other Clinician: Referring Provider: Treating Provider/Extender: [name], [place] [name] [name] in Treatment: [address] [name], [name] ([id number]) [id number] Page 5 of 8 ICD-10 Encounter Code Description Active Date MDM Diagnosis I87.333 Chronic venous hypertension (idiopathic) with ulcer and inflammation of 08/02/2022 No Yes bilateral lower extremity L97.822 Non-pressure chronic ulcer of other part of left lower leg with fat layer 08/02/2022 No Yes exposed L97.812 Non-pressure chronic ulcer of other part of right lower leg with fat layer 08/02/2022 No Yes exposed I73.89 Other specified peripheral vascular diseases 08/02/2022 No Yes I48.0 Paroxysmal atrial fibrillation 08/02/2022 No Yes I10 Essential (primary) hypertension 08/02/2022 No Yes Inactive Problems Resolved Problems Electronic Signature(s) Signed: 10/02/2022 4:30:46 PM By: Linton Ham MD Entered By: Linton Ham on 10/02/2022 12:12:15 -------------------------------------------------------------------------------- Progress Note Details Patient Name: Date of Service: [name] [name], [name] 10/02/2022 11:30 [name] Medical Record Number: [id number] Patient Account Number: [id number] Date of Birth/Sex:  Treating RN: 12-Dec-1945 (76 y.o. [name] Primary Care Provider: Lelon [name] Other Clinician: Referring Provider: Treating Provider/Extender: [name], [name] [name] in Treatment: 8 Subjective History of Present Illness (HPI) 76 year old male who has [name] past medical history of essential hypertension, chronic atrial fibrillation, peripheral vascular disease, nonischemic cardiomyopathy,venous stasis dermatitis, gouty arthropathy, basal cell carcinoma of the right lower extremity, benign prostatic hypertrophy, long-term use of anticoagulation therapy, hyperglycemia and exercise intolerance has never been [name] smoker. the patient has had [name] vascular workup over 7 years ago and said everything was normal at that stage. He does not have any  chronic problems except for cardiac issues which he sees [name] cardiologist in [place]. 08/15/2017 -- arterial and venous duplex studies still pending. 08/23/2017 -- venous reflux studies done on 08/13/2017 shows venous incompetence throughout the left lower extremity deep system and focally at the left saphenofemoral junction. 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 No evidence of SVT or DVT in bilateral lower extremities The patient has an appointment at the end of the month to get his arterial duplex study done [name], [name] ([id number]) [id number] Page 6 of 8 09/05/2017 -- the patient was seen at the vein and vascular office yesterday by [name] ABI studies were notable for medial calcification and the toe brachial indices were normal and bilateral ankle-brachial) waveforms were normal with triphasic flow. After review of his venous studies he was not [name] candidate for laser ablation and his lymphedema was to be treated with compression stockings and lymphedema pump pumps 09/12/2017 -- had [name] low arterial study done at the [place] [facility] -- unable to obtain  reliable ABI is due to medial calcification. Bilateral toe indices were normal with the right being 1.01 and the left being 0.92 and the waveforms were triphasic bilaterally. he did get hold of 30-40 mm compression stockings but is unable to put these on. We will try and get him alternative compression stockings. 09/26/17- he is here in follow up evaluation of [name] right lower extremity ulcer;he is compliant in wearing compression stocking; ulcer almost epithelialized , anticipate healing next appointment Readmission: 11/17 point upon evaluation patient's wound currently that he is seeing [place] for today is [name] skin cancerous lesion that was cleared away by his dermatologist on the left medial calf region. He tells me that this is [name] very similar thing to what he had done previously in fact the last time he saw him in 2018 this was also what was going on at that point. Nonetheless he feels that based on what he seeing currently that this is just having [name] lot of harder time healing although it is much closer to the surface than what he is experienced in the past. He notes that the initial removal was in June 2022 which was this year this is now November and still has not closed. He does have some edema and definitely I think that there is some venous component to his slow healing here. Also think that we can do something better than Vaseline to try to help with getting this to clear up as quickly as possible. He does have [name] history of atrial fibrillation and is on Eliquis otherwise he really has no major medical problems that would affect wound healing. 09/07/2021 upon evaluation today patient actually appears to be doing significantly better after having wrapped him last week. Overall I think that this is making significant improvements at this time which is great news. I do not see any evidence of infection which is great news as well. No fevers, chills, nausea, vomiting, or diarrhea. 09/14/2021 upon  evaluation today patient appears to be doing well with regard to his leg ulcer. He has been tolerating the dressing changes and overall I think that he is making excellent progress. I do not see any signs of active infection at this time. 09/21/2021 upon evaluation today patient actually appears to be making good progress with regard to his wound this is again measuring smaller today no debridement seems to be necessary. We have been using [name] silver collagen dressing and I  think that is doing an awesome job. 09/28/2021 upon evaluation today patient appears to be doing well with regard to his leg currently. I do not see any signs of active infection at this time which is great news. No fevers, chills, nausea, vomiting, or diarrhea. I think this wound is very close to complete resolution. 10/12/2021 upon evaluation today patient actually appears to be doing awesome in regard to his leg ulcer. In fact this appears to be completely healed based on what I am seeing currently. I do not see any evidence of active infection locally nor systemically at this time which is also great news. No fevers, chills, nausea, vomiting, or diarrhea. Readmission: 12/07/2021 upon evaluation today patient presents for readmission here in the clinic. He was discharged on 10/12/2021 is completely healed. Unfortunately this has reopened at this point and he is having continual issues with new blisters over both lower extremities. This is even worse than what we previously saw. Nonetheless we did actually check his ABIs today and it did reveal that his ABIs were 0.55 on the left and 0.57 on the right. Subsequently this is [name] definite change from his last arterial study which showed that he did have good blood flow at 1.01 on the right and 0.92 on the left and that was right at the beginning of 2019. Nonetheless based on what we see currently I do think he tolerated the 3 layer compression wrap but I do believe that we probably need to get  him tested for his arterial flow in order to see where things stand and if there is something we can do there that would help prevent this from continue to be an ongoing issue. He did not utilize compression socks in the interim from when he was last here till this time. That something is probably going to need lifelong going forward as well. 3/9; patient presents for follow-up. He has no issues or complaints today. He tolerated the compression wrap well. He had ABIs with TBI's done. He denies signs of infection. 12/21/2021 upon evaluation today patient appears to be doing well with regard to the wounds on his legs. Both are showing signs of significant improvement which is great news although I do believe some sharp debridement would be of benefit here as well. 12/28/2021 upon evaluation today patient appears to be doing well with regard to his wounds. Everything is showing signs of excellent improvement which I am very pleased about. I think that we are headed in the right direction here. Fortunately there does not appear to be any evidence of infection which is great news there is [name] little bit of hypergranulation. 01/04/2022 upon evaluation today patient appears to be doing well with regard to his wounds 2 of them are healed 1 is almost so and the other 1 is significantly better. Overall I am extremely pleased with where we stand and I think that he is making excellent progress here. I do not see any evidence of active infection locally nor systemically at this time. 01-16-2022 upon evaluation today patient's wound on the left leg is showing signs of doing quite well. Has not completely cleared at this point but it is much improved. Fortunately I do not see any signs of infection at this time. No fevers, chills, nausea, vomiting, or diarrhea. 01-23-2022 upon evaluation today patient's wound of the left leg actually appears to be pretty much completely healed which is great news. I do not see any signs  of active infection locally  or systemically which is excellent. With that being said on the right leg what wound is measuring smaller the other 1 is [name] new wound that just showed up fortunately its not too bad. Has been using Xeroform here and that seems to be doing decently well which is great news. Unfortunately his blood pressure is significantly high we gave him the readings for the past 4-5 visits as well as [name] recommendation to make an appointment to go discuss this with his primary care provider patient states that he is going to look into doing this. 01-30-2022 upon evaluation today patient appears to be doing well with regard to his left leg everything appears to be healed. On the right leg the more anterior wound is healed the more medial wound that I been concerned about [name] possible skin cancer unfortunately still does not look great to me. I do believe that we should probably do [name] biopsy I have talked about it with him [name] few times I think though it is probably time to go ahead and do this at this point. 02-09-2022 upon evaluation today patient appears to be doing well with regard to his legs. On the left this appears to be completely healed. On the right he does have 2 areas and be perfectly honest one of them is [name] skin cancer that he is going to the Mohs surgery clinic for the other seems to be healing nicely. Readmission: 08-02-2022 upon evaluation today patient appears for reevaluation here in our clinic concerning issues that he has been having with wounds over the bilateral lower extremities. I last saw him in May 2023 and at that point we had him completely healed. Unfortunately he is tells me this has broken down to some degree since that point. 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 He  tells me this 1 seems to just be getting bigger and bigger not improving. Fortunately he is not having any significant pain which is good news he does have quite [name] bit of swelling and he tells me that his fluid pills are not recommended for him to take daily but just in 3-day intervals here and there. 08-09-2022 upon evaluation today patient appears to be doing still somewhat poorly in regard to his legs although in general he does not appear to be feeling as good as he has been. Fortunately there does not appear to be any signs of infection which is good news. With that being said he is having some issues here with having and overall poor feeling in general which again is good I think going to be the biggest complicating factor. He actually seems to be coughing I do not hear any wheezing right now I did listen to his chest he did not have good airflow down low however makes me suspicious for bronchitis or even possibly [name], [name] ([id number]) [id number] Page 7 of 8 pneumonia which could be part of what is going on here as well. Fortunately I do not see any evidence of anything worsening in regard to his legs but I definitely believe that he needs to continue with the compression wraps he took them off yesterday to shower has not had anything on for 24 hours that is why his legs are so swollen today. With regard  to his pathology report I did review that it showed some squamous abnormality but no signs of distinct carcinoma. With that being said it was saying that it could be adjacent to [name] squamous cell carcinoma nonetheless my suggestion is can be that we have the patient take copy of this report and give it to his Mohs surgeon in order for them to see if there is anything they feel like needs to be done further. With that being said right now I feel like the primary thing is going to be for [place] to try to get his swelling down and keep that down into that hand since he  is having so much drainage I believe we can have to bring him in for dressing changes twice [name] week doing [name] nurse visit on Mondays. 11/9; since the patient was last here he spent the night in the emergency room he received IV Lasix. Also received antibiotics although he was not discharged on either 1 of these. He also saw his cardiology office who put him on regular Lasix 20 mg [previously on as needed Lasix 20 mg]. Per our intake nurse the swelling in his legs is remarkably better but he still has bilateral lower extremity wounds. He still has wounds on the bilateral lower extremities most problematically on the left lateral calf. He has been using silver alginate under 3 layer compression. 08-23-2022 upon evaluation today patient appears to be doing much better than the last time I saw him 2 weeks ago. At that point I was very concerned about how he was doing he did see Dr. Caryn Section his primary care provider they got him on some blood pressure medication in general his color and overall appearance looks to be doing much improved compared to the last time I saw him. 09-04-2022 upon evaluation today patient appears to be doing well currently in regard to his wounds. Everything is showing signs of improvement which is great news. Fortunately there does not appear to be any signs of active infection locally or systemically at this time. No fevers, chills, nausea, vomiting, or diarrhea. 09-10-2022 upon evaluation today patient appears to be doing better in regard to his wounds although the [facility] was extremely stuck to the wound bed. Fortunately there does not appear to be any signs of infection locally or systemically at this time which is great news. No fevers, chills, nausea, vomiting, or diarrhea. 09-17-2022 upon evaluation today patient appears to be doing well currently in regard to his wounds in general. The right leg actually showing signs of excellent improvement and very pleased with where  things stand in that regard. Fortunately I do not see any evidence of infection locally or systemically at this time which is great news. No fevers, chills, nausea, vomiting, or diarrhea. 09-24-2022 upon evaluation today patient appears to be doing well currently in regard to his wounds. Things look to be doing quite well. 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 He is seeing his dermatologist tomorrow in that regard. With that being said other than that however he seems to really be making some pretty good progress here which is good news. No fevers, chills, nausea, vomiting, or diarrhea. 12/26; the patient has 2 open wounds remaining on the left leg. One is on the left anterior mid tibia and the other is on the right lateral knee  just outside of the popliteal fossa. 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 The patient has been to see dermatology Dr. Evorn Gong who apparently is making him [name] referral to the [facility] Mohs surgery center. He does not yet have an appointment Objective Constitutional Sitting or standing Blood Pressure is within target range for patient.. Pulse regular and within target range for patient.Marland Kitchen Respirations regular, non-labored and within target range.. Temperature is normal and within the target range for the patient.Marland Kitchen appears in no distress. Vitals Time Taken: 11:49 AM, Height: 75 in, Weight: 225 lbs, BMI: 28.1, Temperature: 98.4 F, Pulse: 58 bpm, Respiratory Rate: 16 breaths/min, Blood Pressure: 131/61 mmHg. Cardiovascular He has edema where the upper part of his Tubigrip what wrap would have been.. General Notes: Wound exam; the area anteriorly in the mid tibia is superficial. This looks like it will close. He has severe flaking dry skin on his left leg in its entirety. The area on the left medial  knee area also looks healthy. This is where the malignancy was diagnosed by biopsy Integumentary (Hair, Skin) Wound #15 status is Open. Original cause of wound was Gradually Appeared. The date acquired was: 08/28/2022. The wound has been in treatment 4 weeks. The wound is located on the Left,Posterior Lower Leg. The wound measures 1.3cm length x 1.7cm width x 0.1cm depth; 1.736cm^2 area and 0.174cm^3 volume. There is Fat Layer (Subcutaneous Tissue) exposed. There is [name] medium amount of sanguinous drainage noted. Foul odor after cleansing was noted. There is no granulation within the wound bed. There is no necrotic tissue within the wound bed. Wound #9 status is Open. Original cause of wound was Gradually Appeared. The date acquired was: 01/20/2022. The wound has been in treatment 8 weeks. The wound is located on the Left,Lateral Lower Leg. The wound measures 2cm length x 0.7cm width x 0.1cm depth; 1.1cm^2 area and 0.11cm^3 volume. There is Fat Layer (Subcutaneous Tissue) exposed. There is [name] large amount of serosanguineous drainage noted. The wound margin is thickened. There is medium (34- 66%) red, friable, hyper - granulation within the wound bed. There is [name] medium (34-66%) amount of necrotic tissue within the wound bed including Adherent Slough. 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 We are using silver alginate and Zetivot to the anterior lower leg wound and silver alginate to the area of the malignancy. 2. He will need to change the wound with  [name] squamous cell carcinoma himself I have elected to put the leg otherwise in 3 layer compression. 3. I have asked him to call the Mohs surgery center at [facility] next week if he has not been called about an appointment Electronic Signature(s) Signed: 10/02/2022 4:30:46 PM By: Linton Ham MD Entered By: Linton Ham on 10/02/2022 12:16:18 -------------------------------------------------------------------------------- SuperBill Details Patient Name: Date of Service: [name] 10/02/2022 Medical Record Number: [id number] Patient Account Number: [id number] Date of Birth/Sex: Treating RN: Feb 02, 1946 (76 y.o. [name] Primary Care Provider: Lelon [name] Other Clinician: Referring Provider: Treating Provider/Extender: [name] BSO Delane Ginger, [name] [name] in Treatment: 8 Diagnosis Coding ICD-10 Codes Code Description [phone] Chronic  venous hypertension (idiopathic) with ulcer and inflammation of bilateral lower extremity L97.822 Non-pressure chronic ulcer of other part of left lower leg with fat layer exposed L97.812 Non-pressure chronic ulcer of other part of right lower leg with fat layer exposed I73.89 Other specified peripheral vascular diseases I48.0 Paroxysmal atrial fibrillation I10 Essential (primary) hypertension Facility Procedures : CPT4 Code: [id number] Description: (Facility Use Only) [id number] - [place] LWR LT LEG Modifier: Quantity: 1 Physician Procedures : CPT4 Code Description Modifier [id number] - WC PHYS LEVEL 3 - EST PT ICD-10 Diagnosis Description 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 L97.822 Non-pressure chronic ulcer of other  part of left lower leg with fat layer exposed Quantity: 1 Electronic Signature(s) Signed: 10/02/2022 1:09:34 PM By: [name] MSN RN CNS WTA Signed: 10/02/2022 4:30:46 PM By: Linton Ham MD Entered By: [name] on 10/02/2022  13:09:33

## 2022-10-02 NOTE — Telephone Encounter (Signed)
Pt made aware of lab results along with MD's recommendations. Pt verbalized understanding. Repeat lab order placed

## 2022-10-02 NOTE — Telephone Encounter (Signed)
Please advise patient reviewed labs from his cardiologist and recommend starting a thyroid medication which should help his metabolism and swelling. Have sent prescription for levothyroxine to [facility], please schedule follow up office visit in 7-8 weeks.

## 2022-10-03 NOTE — Progress Notes (Signed)
Goal Status: Met Interventions: Assess peripheral edema status every visit. Compression as ordered Provide education on venous insufficiency [name], [name] ([id number]) [id number] Page 7 of 12 Treatment Activities: Therapeutic compression applied : 09/04/2022 Notes: Wound/Skin Impairment Nursing Diagnoses: Impaired tissue integrity Knowledge deficit related to smoking impact on wound healing Knowledge deficit related to ulceration/compromised skin integrity Goals: Patient/caregiver will verbalize understanding of skin care regimen Date Initiated: 09/04/2022 Date Inactivated: 09/10/2022 Target Resolution Date: 10/04/2022 Goal Status: Met Ulcer/skin breakdown will have [name] volume reduction of 30% by week 4 Date Initiated:  09/04/2022 Date Inactivated: 09/10/2022 Target Resolution Date: 10/04/2022 Goal Status: Unmet Unmet Reason: comorbidities Ulcer/skin breakdown will have [name] volume reduction of 50% by week 8 Date Initiated: 09/04/2022 Target Resolution Date: 11/04/2022 Goal Status: Active Ulcer/skin breakdown will have [name] volume reduction of 80% by week 12 Date Initiated: 09/04/2022 Target Resolution Date: 12/05/2022 Goal Status: Active Ulcer/skin breakdown will heal within 14 weeks Date Initiated: 09/04/2022 Target Resolution Date: 12/12/2022 Goal Status: Active Interventions: Assess patient/caregiver ability to obtain necessary supplies Assess patient/caregiver ability to perform ulcer/skin care regimen upon admission and as needed Assess ulceration(s) every visit Provide education on ulcer and skin care Notes: Electronic Signature(s) Signed: 10/02/2022 1:09:55 PM By: [name] MSN RN CNS WTA Entered By: [name] on 10/02/2022 13:09:55 -------------------------------------------------------------------------------- Pain Assessment Details Patient Name: Date of Service: [name] [name], [name] 10/02/2022 11:30 [name] M Medical Record Number: [id number] Patient Account Number: [id number] Date of Birth/Sex: Treating RN: 1946/05/17 (76 y.o. [name] Primary Care Tamaria Dunleavy: [name] Other Clinician: Referring Aaryav Hopfensperger: Treating [place] Vanscyoc/Extender: [name], MICHA EL Annye English in Treatment: 8 Active Problems Location of Pain Severity and Description of Pain Patient Has Paino No Site Locations [place] ([id number]) [phone].pdf Page 8 of 12 Pain Management and Medication Current Pain Management: Electronic Signature(s) Signed: 10/02/2022 1:06:08 PM By: [name] MSN RN CNS WTA Entered By: [name] on 10/02/2022 93:81:82 -------------------------------------------------------------------------------- Patient/Caregiver Education  Details Patient Name: Date of Service: [name] [name] 12/26/2023andnbsp11:30 [name] M Medical Record Number: [id number] Patient Account Number: [id number] Date of Birth/Gender: Treating RN: [birth year] (76 y.o. [name] Primary Care Physician: [name] Other Clinician: Referring Physician: Treating Physician/Extender: [name], [place] EL Annye English in Treatment: 8 Education Assessment Education Provided To: Patient Education Topics Provided Wound/Skin Impairment: Handouts: Caring for Your Ulcer Methods: Explain/Verbal Responses: State content correctly Electronic Signature(s) Signed: 10/02/2022 5:21:00 PM By: [name] MSN RN CNS WTA Entered By: [name] on 10/02/2022 13:09:50 [name], [name] ([id number]) [id number] Page 9 of 12 -------------------------------------------------------------------------------- Wound Assessment Details Patient Name: Date of Service: [name] [name] Scot [name] 10/02/2022 11:30 [name] M Medical Record Number: [id number] Patient Account Number: [id number] Date of Birth/Sex: Treating RN: 1946/07/22 (76 y.o. [name] Primary Care Margaret Cockerill: [name] Other Clinician: Referring Yeraldi Fidler: Treating [place] Dodgen/Extender: Eldridge Dace, MICHA EL Annye English in Treatment: 8 Wound Status Wound Number: 15 Primary Etiology: Venous Leg Ulcer Wound Location: Left, Posterior Lower Leg Wound Status: Open Wounding Event: Gradually Appeared Comorbid History: Arrhythmia, Hypertension, Gout Date Acquired: 08/28/2022 Weeks Of Treatment: 4 Clustered Wound: No Photos Wound Measurements Length: (cm) 1.3 Width: (cm) 1.7 Depth: (cm) 0.1 Area: (cm) 1.736 Volume: (cm) 0.174 % Reduction in Area: 38.6% % Reduction in Volume: -521.4% Epithelialization: None Wound Description Classification: Full Thickness Without Exposed Support Exudate Amount: Medium Exudate Type: Sanguinous Exudate Color:  red Structures Foul Odor After Cleansing:  Yes Due to Product Use: No Slough/Fibrino No Wound Bed Granulation Amount: None Present (0%) Exposed Structure Necrotic Amount: None Present (0%) Fascia Exposed: No Fat Layer (Subcutaneous Tissue) Exposed: Yes Tendon Exposed: No Muscle Exposed: No Joint Exposed: No Bone Exposed: No Treatment Notes Wound #15 (Lower Leg) Wound Laterality: Left, Posterior Cleanser Peri-Wound Care Topical BOYSIE, BONEBRAKE [name] ([id number]) [id number] Page 10 of 12 Primary Dressing Silvercel Small 2x2 (in/in) Discharge Instruction: Apply Silvercel Small 2x2 (in/in) as instructed Secondary Dressing Coverlet Latex-Free Fabric Adhesive Dressings Discharge Instruction: 1.5 x 2 Secured With Compression Wrap Compression Stockings Add-Ons Electronic Signature(s) Signed: 10/02/2022 5:21:00 PM By: [name] MSN RN CNS WTA Entered By: [name] on 10/02/2022 11:59:40 -------------------------------------------------------------------------------- Wound Assessment Details Patient Name: Date of Service: [name] [name], [name] 10/02/2022 11:30 [name] M Medical Record Number: [id number] Patient Account Number: [id number] Date of Birth/Sex: Treating RN: [birth year] (76 y.o. [name] Primary Care Nelli Swalley: [name] Other Clinician: Referring Jerolyn Flenniken: Treating Shataya Winkles/Extender: Eldridge Dace, MICHA EL Annye English in Treatment: 8 Wound Status Wound Number: 9 Primary Etiology: Venous Leg Ulcer Wound Location: Left, Lateral Lower Leg Wound Status: Open Wounding Event: Gradually Appeared Comorbid History: Arrhythmia, Hypertension, Gout Date Acquired: 01/20/2022 Weeks Of Treatment: 8 Clustered Wound: No Photos Wound Measurements Length: (cm) Width: (cm) Depth: (cm) Area: (cm) Volume: (cm) 2 % Reduction in Area: 93.7% 0.7 % Reduction in Volume: 96.9% 0.1 Epithelialization: Medium (34-66%) 1.1 0.11 Wound  Description Classification: Full Thickness Without Exposed Support Structures Wound Margin: Thickened [name], [name] ([id number]) Exudate Amount: Large Exudate Type: Serosanguineous Exudate Color: red, brown Foul Odor After Cleansing: No Slough/Fibrino Yes [id number].pdf Page 11 of 12 Wound Bed Granulation Amount: Medium (34-66%) Exposed Structure Granulation Quality: Red, Hyper-granulation, Friable Fascia Exposed: No Necrotic Amount: Medium (34-66%) Fat Layer (Subcutaneous Tissue) Exposed: Yes Necrotic Quality: Adherent Slough Tendon Exposed: No Muscle Exposed: No Joint Exposed: No Bone Exposed: No Treatment Notes Wound #9 (Lower Leg) Wound Laterality: Left, Lateral Cleanser Peri-Wound Care Topical Primary Dressing Silvercel Small 2x2 (in/in) Discharge Instruction: Apply Silvercel Small 2x2 (in/in) as instructed Secondary Dressing Zetuvit Plus 4x8 (in/in) Secured With [employer] Sterile or Non-Sterile 6-ply 4.5x4 (yd/yd) Discharge Instruction: Apply Kerlix as directed Compression Wrap 3-LAYER WRAP - Profore Lite LF 3 Multilayer Compression Bandaging System Discharge Instruction: Apply 3 multi-layer wrap as prescribed. Compression Stockings Add-Ons Electronic Signature(s) Signed: 10/02/2022 5:21:00 PM By: [name] MSN RN CNS WTA Entered By: [name] on 10/02/2022 12:00:10 -------------------------------------------------------------------------------- Vitals Details Patient Name: Date of Service: [name] [name], [name] 10/02/2022 11:30 [name] M Medical Record Number: [id number] Patient Account Number: [id number] Date of Birth/Sex: Treating RN: 11/06/1945 (76 y.o. [name] Primary Care Santi Troung: [name] Other Clinician: Referring Jazper Nikolai: Treating Neka Bise/Extender: [name], MICHA EL Annye English in Treatment: 8 Vital Signs Time Taken: 11:49 Temperature (F): 98.4 Height (in): 75 Pulse (bpm): 58 Weight (lbs):  225 Respiratory Rate (breaths/min): 16 Body Mass Index (BMI): 28.1 Blood Pressure (mmHg): 131/61 Reference Range: 80 - 120 mg / dl [name], [name] ([id number]) [id number].pdf Page 12 of 12 Electronic Signature(s) Signed: 10/02/2022 1:06:04 PM By: [name] MSN RN CNS WTA Entered By: [name] on 10/02/2022 13:06:04  Goal Status: Met Interventions: Assess peripheral edema status every visit. Compression as ordered Provide education on venous insufficiency [name], [name] ([id number]) [id number] Page 7 of 12 Treatment Activities: Therapeutic compression applied : 09/04/2022 Notes: Wound/Skin Impairment Nursing Diagnoses: Impaired tissue integrity Knowledge deficit related to smoking impact on wound healing Knowledge deficit related to ulceration/compromised skin integrity Goals: Patient/caregiver will verbalize understanding of skin care regimen Date Initiated: 09/04/2022 Date Inactivated: 09/10/2022 Target Resolution Date: 10/04/2022 Goal Status: Met Ulcer/skin breakdown will have [name] volume reduction of 30% by week 4 Date Initiated:  09/04/2022 Date Inactivated: 09/10/2022 Target Resolution Date: 10/04/2022 Goal Status: Unmet Unmet Reason: comorbidities Ulcer/skin breakdown will have [name] volume reduction of 50% by week 8 Date Initiated: 09/04/2022 Target Resolution Date: 11/04/2022 Goal Status: Active Ulcer/skin breakdown will have [name] volume reduction of 80% by week 12 Date Initiated: 09/04/2022 Target Resolution Date: 12/05/2022 Goal Status: Active Ulcer/skin breakdown will heal within 14 weeks Date Initiated: 09/04/2022 Target Resolution Date: 12/12/2022 Goal Status: Active Interventions: Assess patient/caregiver ability to obtain necessary supplies Assess patient/caregiver ability to perform ulcer/skin care regimen upon admission and as needed Assess ulceration(s) every visit Provide education on ulcer and skin care Notes: Electronic Signature(s) Signed: 10/02/2022 1:09:55 PM By: [name] MSN RN CNS WTA Entered By: [name] on 10/02/2022 13:09:55 -------------------------------------------------------------------------------- Pain Assessment Details Patient Name: Date of Service: [name] [name], [name] 10/02/2022 11:30 [name] M Medical Record Number: [id number] Patient Account Number: [id number] Date of Birth/Sex: Treating RN: 1946/05/17 (76 y.o. [name] Primary Care Tamaria Dunleavy: [name] Other Clinician: Referring Aaryav Hopfensperger: Treating [place] Vanscyoc/Extender: [name], MICHA EL Annye English in Treatment: 8 Active Problems Location of Pain Severity and Description of Pain Patient Has Paino No Site Locations [place] ([id number]) [phone].pdf Page 8 of 12 Pain Management and Medication Current Pain Management: Electronic Signature(s) Signed: 10/02/2022 1:06:08 PM By: [name] MSN RN CNS WTA Entered By: [name] on 10/02/2022 93:81:82 -------------------------------------------------------------------------------- Patient/Caregiver Education  Details Patient Name: Date of Service: [name] [name] 12/26/2023andnbsp11:30 [name] M Medical Record Number: [id number] Patient Account Number: [id number] Date of Birth/Gender: Treating RN: [birth year] (76 y.o. [name] Primary Care Physician: [name] Other Clinician: Referring Physician: Treating Physician/Extender: [name], [place] EL Annye English in Treatment: 8 Education Assessment Education Provided To: Patient Education Topics Provided Wound/Skin Impairment: Handouts: Caring for Your Ulcer Methods: Explain/Verbal Responses: State content correctly Electronic Signature(s) Signed: 10/02/2022 5:21:00 PM By: [name] MSN RN CNS WTA Entered By: [name] on 10/02/2022 13:09:50 [name], [name] ([id number]) [id number] Page 9 of 12 -------------------------------------------------------------------------------- Wound Assessment Details Patient Name: Date of Service: [name] [name] Scot [name] 10/02/2022 11:30 [name] M Medical Record Number: [id number] Patient Account Number: [id number] Date of Birth/Sex: Treating RN: 1946/07/22 (76 y.o. [name] Primary Care Margaret Cockerill: [name] Other Clinician: Referring Yeraldi Fidler: Treating [place] Dodgen/Extender: Eldridge Dace, MICHA EL Annye English in Treatment: 8 Wound Status Wound Number: 15 Primary Etiology: Venous Leg Ulcer Wound Location: Left, Posterior Lower Leg Wound Status: Open Wounding Event: Gradually Appeared Comorbid History: Arrhythmia, Hypertension, Gout Date Acquired: 08/28/2022 Weeks Of Treatment: 4 Clustered Wound: No Photos Wound Measurements Length: (cm) 1.3 Width: (cm) 1.7 Depth: (cm) 0.1 Area: (cm) 1.736 Volume: (cm) 0.174 % Reduction in Area: 38.6% % Reduction in Volume: -521.4% Epithelialization: None Wound Description Classification: Full Thickness Without Exposed Support Exudate Amount: Medium Exudate Type: Sanguinous Exudate Color:  red Structures Foul Odor After Cleansing:  Goal Status: Met Interventions: Assess peripheral edema status every visit. Compression as ordered Provide education on venous insufficiency [name], [name] ([id number]) [id number] Page 7 of 12 Treatment Activities: Therapeutic compression applied : 09/04/2022 Notes: Wound/Skin Impairment Nursing Diagnoses: Impaired tissue integrity Knowledge deficit related to smoking impact on wound healing Knowledge deficit related to ulceration/compromised skin integrity Goals: Patient/caregiver will verbalize understanding of skin care regimen Date Initiated: 09/04/2022 Date Inactivated: 09/10/2022 Target Resolution Date: 10/04/2022 Goal Status: Met Ulcer/skin breakdown will have [name] volume reduction of 30% by week 4 Date Initiated:  09/04/2022 Date Inactivated: 09/10/2022 Target Resolution Date: 10/04/2022 Goal Status: Unmet Unmet Reason: comorbidities Ulcer/skin breakdown will have [name] volume reduction of 50% by week 8 Date Initiated: 09/04/2022 Target Resolution Date: 11/04/2022 Goal Status: Active Ulcer/skin breakdown will have [name] volume reduction of 80% by week 12 Date Initiated: 09/04/2022 Target Resolution Date: 12/05/2022 Goal Status: Active Ulcer/skin breakdown will heal within 14 weeks Date Initiated: 09/04/2022 Target Resolution Date: 12/12/2022 Goal Status: Active Interventions: Assess patient/caregiver ability to obtain necessary supplies Assess patient/caregiver ability to perform ulcer/skin care regimen upon admission and as needed Assess ulceration(s) every visit Provide education on ulcer and skin care Notes: Electronic Signature(s) Signed: 10/02/2022 1:09:55 PM By: [name] MSN RN CNS WTA Entered By: [name] on 10/02/2022 13:09:55 -------------------------------------------------------------------------------- Pain Assessment Details Patient Name: Date of Service: [name] [name], [name] 10/02/2022 11:30 [name] M Medical Record Number: [id number] Patient Account Number: [id number] Date of Birth/Sex: Treating RN: 1946/05/17 (76 y.o. [name] Primary Care Tamaria Dunleavy: [name] Other Clinician: Referring Aaryav Hopfensperger: Treating [place] Vanscyoc/Extender: [name], MICHA EL Annye English in Treatment: 8 Active Problems Location of Pain Severity and Description of Pain Patient Has Paino No Site Locations [place] ([id number]) [phone].pdf Page 8 of 12 Pain Management and Medication Current Pain Management: Electronic Signature(s) Signed: 10/02/2022 1:06:08 PM By: [name] MSN RN CNS WTA Entered By: [name] on 10/02/2022 93:81:82 -------------------------------------------------------------------------------- Patient/Caregiver Education  Details Patient Name: Date of Service: [name] [name] 12/26/2023andnbsp11:30 [name] M Medical Record Number: [id number] Patient Account Number: [id number] Date of Birth/Gender: Treating RN: [birth year] (76 y.o. [name] Primary Care Physician: [name] Other Clinician: Referring Physician: Treating Physician/Extender: [name], [place] EL Annye English in Treatment: 8 Education Assessment Education Provided To: Patient Education Topics Provided Wound/Skin Impairment: Handouts: Caring for Your Ulcer Methods: Explain/Verbal Responses: State content correctly Electronic Signature(s) Signed: 10/02/2022 5:21:00 PM By: [name] MSN RN CNS WTA Entered By: [name] on 10/02/2022 13:09:50 [name], [name] ([id number]) [id number].pdf Page 9 of 12 -------------------------------------------------------------------------------- Wound Assessment Details Patient Name: Date of Service: [name] [name] Scot [name] 10/02/2022 11:30 [name] M Medical Record Number: [id number] Patient Account Number: [id number] Date of Birth/Sex: Treating RN: 1946/07/22 (76 y.o. [name] Primary Care Margaret Cockerill: [name] Other Clinician: Referring Yeraldi Fidler: Treating [place] Dodgen/Extender: Eldridge Dace, MICHA EL Annye English in Treatment: 8 Wound Status Wound Number: 15 Primary Etiology: Venous Leg Ulcer Wound Location: Left, Posterior Lower Leg Wound Status: Open Wounding Event: Gradually Appeared Comorbid History: Arrhythmia, Hypertension, Gout Date Acquired: 08/28/2022 Weeks Of Treatment: 4 Clustered Wound: No Photos Wound Measurements Length: (cm) 1.3 Width: (cm) 1.7 Depth: (cm) 0.1 Area: (cm) 1.736 Volume: (cm) 0.174 % Reduction in Area: 38.6% % Reduction in Volume: -521.4% Epithelialization: None Wound Description Classification: Full Thickness Without Exposed Support Exudate Amount: Medium Exudate Type: Sanguinous Exudate Color:  red Structures Foul Odor After Cleansing:  X- 1 5 Wound Imaging (photographs - any number of wounds) _0  - 0 Wound Tracing (instead of photographs) _1  - 0 Simple Wound Measurement - one wound X- 2 5 Complex Wound Measurement - multiple wounds INTERVENTIONS - Wound Dressings X - Small Wound Dressing one or multiple wounds 2 10 _2  - 0 Medium Wound Dressing one or multiple wounds _3  - 0 Large Wound Dressing one or multiple wounds <[id number]>_6<\/[id number]>_4  - 0 Application of Medications - topical <[id number]>_0<\/[id number]>_8  - 0 Application of Medications - injection INTERVENTIONS - Miscellaneous _6  - 0 External ear exam _7  - 0 Specimen Collection (cultures, biopsies, blood, body fluids, etc.) [name], [name] ([id number]) [id number].pdf Page 3 of 12 _8  - 0 Specimen(s) / Culture(s) sent or taken to Lab for analysis _9  - 0 Patient Transfer (multiple staff / Harrel Lemon Lift / Similar devices) _10  - 0 Simple Staple / Suture removal (25 or less) _11  - 0 Complex Staple / Suture removal (26 or more) _12  - 0 Hypo / Hyperglycemic Management (close monitor of Blood Glucose) _13  - 0 Ankle / Brachial Index (ABI) - do not check if billed separately X- 1 5 Vital Signs Has the patient been seen at the hospital within the last three years: Yes Total Score: 110 Level Of Care: New/Established - Level 3 Electronic Signature(s) Signed: 10/02/2022 5:21:00 PM By: [name] MSN RN CNS WTA Entered By: [name] on 10/02/2022 13:08:47 -------------------------------------------------------------------------------- Compression Therapy Details Patient Name: Date of Service: [name] [name], [name] 10/02/2022 11:30 [name] M Medical Record Number: [id number] Patient Account Number: [id number] Date of Birth/Sex: Treating RN: 08/10/1946 (76 y.o. [name] Primary Care Carnesha Maravilla: [name] Other Clinician: Referring Yatzil Clippinger: Treating  Lawerance Matsuo/Extender: [name], MICHA EL Annye English in Treatment: 8 Compression Therapy Performed for Wound Assessment: Wound #9 Left,Lateral Lower Leg Performed By: Clinician [name], RN Compression Type: Three Layer Post Procedure Diagnosis Same as Pre-procedure Electronic Signature(s) Signed: 10/02/2022 1:09:14 PM By: [name] MSN RN CNS WTA Entered By: [name] on 10/02/2022 13:09:14 -------------------------------------------------------------------------------- Encounter Discharge Information Details Patient Name: Date of Service: [name] [name], [name] 10/02/2022 11:30 [name] M Medical Record Number: [id number] Patient Account Number: [id number] Date of Birth/Sex: Treating RN: 1945/12/07 (76 y.o. [name] Primary Care Charmion Hapke: [name] Other Clinician: Referring Moss Berry: Treating Mikaeel Petrow/Extender: [address], [place], [place], [place] ([id number]) [id number].pdf Page 4 of 12 Weeks in Treatment: 8 Encounter Discharge Information Items Discharge Condition: Stable Ambulatory Status: Ambulatory Discharge Destination: Home Transportation: Private Auto Accompanied By: self Schedule Follow-up Appointment: Yes Clinical Summary of Care: Electronic Signature(s) Signed: 10/02/2022 1:10:35 PM By: [name] MSN RN CNS WTA Entered By: [name] on 10/02/2022 13:10:35 -------------------------------------------------------------------------------- Lower Extremity Assessment Details Patient Name: Date of Service: [name], [name] 10/02/2022 11:30 [name] M Medical Record Number: [id number] Patient Account Number: [id number] Date of Birth/Sex: Treating RN: September 15, 1946 (76 y.o. [name] Primary Care Breely Panik: [name] Other Clinician: Referring Koehn Salehi: Treating Saskia Simerson/Extender: Eldridge Dace, MICHA EL Annye English in Treatment: 8 Edema Assessment Assessed: [Left: No] [Right: No] Edema:  [Left: Ye] [Right: s] Calf Left: Right: Point of Measurement: From Medial Instep 35.4 cm Ankle Left: Right: Point of Measurement: From Medial Instep 22.6 cm Vascular Assessment Pulses: Dorsalis Pedis Palpable: [Left:Yes] Electronic Signature(s) Signed: 10/02/2022 1:06:16 PM By: [name] MSN RN CNS WTA Entered By: [name] on 10/02/2022 13:06:16 [name] ([id number]) [id number].pdf Page 5 of

## 2022-10-11 ENCOUNTER — Encounter: Payer: Self-pay | Admitting: Cardiology

## 2022-10-11 NOTE — Assessment & Plan Note (Signed)
He does have bilateral venous stasis.  Recommend foot elevation and compression stockings.

## 2022-10-11 NOTE — Assessment & Plan Note (Signed)
Started on statin for lipids.  Blood pressure controlled as noted. Seen by Dr. Fletcher Anon, no plans for invasive evaluation at this time.

## 2022-10-11 NOTE — Assessment & Plan Note (Signed)
CHA2DS2-VASc score still 3 however close to being 4.  He is on standing dose of Xarelto with no major bleeding issues.  Okay to hold Xarelto 1 to 3 days preop for surgical procedures: 1 day for low risk procedures,-vascular, dental 2 days for intermediate procedure: Tissue biopsies, colonoscopy/endoscopy 3 days prior procedures (organ biopsies, spinal procedures or major surgeries)

## 2022-10-11 NOTE — Assessment & Plan Note (Signed)
His BP is usually well-controlled when he is taking his meds.  I added chlorthalidone last time I saw him and his pressures improved on valsartan, carvedilol and chlorthalidone.  As long as he takes his medications, his blood pressure is usually well-controlled.  For now we will make any changes, but need to follow renal function and potassium levels on combination of chlorthalidone and Lasix.  Low threshold to consider switching from chlorthalidone to spironolactone.

## 2022-10-11 NOTE — Assessment & Plan Note (Signed)
Rate controlled on current dose of carvedilol.  No signs of chronotropic incompetence.  Pretty much asymptomatic with being in A-fib. Continue rivaroxaban/Xarelto for stroke prophylaxis.

## 2022-10-11 NOTE — Assessment & Plan Note (Signed)
Labs were just checked today.  Would like to see the LDL less than 100.  However, with him having his memory issues and not actively on claudication or signs of significant obstructive disease, will hold off on being more aggressive.  He is reluctant to start any new medications, and I do not t want to increase to higher dose of rosuvastatin with his memory issues.

## 2022-10-11 NOTE — Assessment & Plan Note (Signed)
Initial EF at been dramatically decreased back in 2005, now back up into the more normal range.  Most recent echo just showed EF of 50 to 55% with mild global HK. Unable to assess diastolic function because of A-fib but suspect that it probably is some with severe biatrial enlargement.  Plan: Continue current dose of carvedilol 12.5 mg twice daily along with valsartan 160 mg daily.  He is also chlorthalidone and now on standing dose of Lasix. Labs checked today showed stable potassium level, so we will need to continue to monitor, if potassium does go down to I do agree with switching from chlorthalidone to spironolactone.

## 2022-10-12 ENCOUNTER — Encounter: Payer: Medicare PPO | Attending: Physician Assistant | Admitting: Physician Assistant

## 2022-10-12 ENCOUNTER — Other Ambulatory Visit: Payer: Self-pay

## 2022-10-12 DIAGNOSIS — Z85828 Personal history of other malignant neoplasm of skin: Secondary | ICD-10-CM | POA: Insufficient documentation

## 2022-10-12 DIAGNOSIS — N138 Other obstructive and reflux uropathy: Secondary | ICD-10-CM

## 2022-10-12 DIAGNOSIS — I10 Essential (primary) hypertension: Secondary | ICD-10-CM | POA: Diagnosis not present

## 2022-10-12 DIAGNOSIS — I480 Paroxysmal atrial fibrillation: Secondary | ICD-10-CM | POA: Diagnosis not present

## 2022-10-12 DIAGNOSIS — I428 Other cardiomyopathies: Secondary | ICD-10-CM | POA: Diagnosis not present

## 2022-10-12 DIAGNOSIS — I7389 Other specified peripheral vascular diseases: Secondary | ICD-10-CM | POA: Diagnosis not present

## 2022-10-12 DIAGNOSIS — L97812 Non-pressure chronic ulcer of other part of right lower leg with fat layer exposed: Secondary | ICD-10-CM | POA: Insufficient documentation

## 2022-10-12 DIAGNOSIS — I87333 Chronic venous hypertension (idiopathic) with ulcer and inflammation of bilateral lower extremity: Secondary | ICD-10-CM | POA: Diagnosis not present

## 2022-10-12 DIAGNOSIS — N529 Male erectile dysfunction, unspecified: Secondary | ICD-10-CM

## 2022-10-12 DIAGNOSIS — L97822 Non-pressure chronic ulcer of other part of left lower leg with fat layer exposed: Secondary | ICD-10-CM | POA: Insufficient documentation

## 2022-10-12 DIAGNOSIS — S81802A Unspecified open wound, left lower leg, initial encounter: Secondary | ICD-10-CM | POA: Diagnosis not present

## 2022-10-12 DIAGNOSIS — Z7901 Long term (current) use of anticoagulants: Secondary | ICD-10-CM | POA: Diagnosis not present

## 2022-10-12 NOTE — Progress Notes (Addendum)
[name], [name] ([id number]) [id number] Page 1 of 9 Visit Report for 10/12/2022 Chief Complaint Document Details Patient Name: Date of Service: [name] [name] 10/12/2022 7:45 [name] Medical Record Number: [id number] Patient Account Number: [id number] Date of Birth/Sex: Treating RN: Jan 03, 1946 (77 y.o. [name] Primary Care Provider: [name] Other Clinician: Referring Provider: Treating Provider/Extender: [name] in Treatment: 10 Information Obtained from: Patient Chief Complaint Bilateral LE Ulcers Electronic Signature(s) Signed: 10/12/2022 12:11:06 PM By: [name] MSN RN CNS WTA Signed: 10/12/2022 1:38:11 PM By: [name] PA-C Previous Signature: 10/12/2022 8:04:50 AM Version By: [name] PA-C Entered By: [name] on 10/12/2022 12:11:06 -------------------------------------------------------------------------------- HPI Details Patient Name: Date of Service: [name], [name] 10/12/2022 7:45 [name] Medical Record Number: [id number] Patient Account Number: [id number] Date of Birth/Sex: Treating RN: [birth year] (77 y.o. [name] Primary Care Provider: [name] Other Clinician: Referring Provider: Treating Provider/Extender: [name] in Treatment: 43 History of Present Illness HPI Description: 77 year old male who has [name] past medical history of essential hypertension, chronic atrial fibrillation, peripheral vascular disease, nonischemic cardiomyopathy,venous stasis dermatitis, gouty arthropathy, basal cell carcinoma of the right lower extremity, benign prostatic hypertrophy, long- term use of anticoagulation therapy, hyperglycemia and exercise intolerance has never been [name] smoker. the patient has had [name] vascular workup over 7 years ago and said everything was normal at that stage. He does not have any chronic problems except for cardiac issues which he sees [name] cardiologist  in [place]. 08/15/2017 -- arterial and venous duplex studies still pending. 08/23/2017 -- venous reflux studies done on 08/13/2017 shows venous incompetence throughout the left lower extremity deep system and focally at the left saphenofemoral junction. 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 No evidence of SVT or DVT in bilateral lower extremities The patient has an appointment at the end of the month to get his arterial duplex study done 09/05/2017 -- the patient was seen at the vein and vascular office yesterday by Angelena Form. ABI studies were notable for medial calcification and the toe brachial indices were normal and bilateral ankle-brachial) waveforms were normal with triphasic flow. After review of his venous studies he was not [name] candidate for laser ablation and his lymphedema was to be treated with compression stockings and lymphedema pump pumps [name], [name] ([id number]) [id number] Page 2 of 9 09/12/2017 -- had [name] low arterial study done at the [facility] -- unable to obtain reliable ABI is due to medial calcification. Bilateral toe indices were normal with the right being 1.01 and the left being 0.92 and the waveforms were triphasic bilaterally. he did get hold of 30-40 mm compression stockings but is unable to put these on. We will try and get him alternative compression stockings. 09/26/17- he is here in follow up evaluation of [name] right lower extremity ulcer;he is compliant in wearing compression stocking; ulcer almost epithelialized , anticipate healing next appointment Readmission: 11/17 point upon evaluation patient's wound currently that he is seeing [place] for today is [name] skin cancerous lesion that was cleared away by his dermatologist on the left medial calf region. He tells me that this is [name] very similar thing to what he had done previously in fact the last time he saw him in 2018 this was also what was  going on at that point. Nonetheless he feels that based on what he seeing currently that this is just  having [name] lot of harder time healing although it is much closer to the surface than what he is experienced in the past. He notes that the initial removal was in June 2022 which was this year this is now November and still has not closed. He does have some edema and definitely I think that there is some venous component to his slow healing here. Also think that we can do something better than Vaseline to try to help with getting this to clear up as quickly as possible. He does have [name] history of atrial fibrillation and is on Eliquis otherwise he really has no major medical problems that would affect wound healing. 09/07/2021 upon evaluation today patient actually appears to be doing significantly better after having wrapped him last week. Overall I think that this is making significant improvements at this time which is great news. I do not see any evidence of infection which is great news as well. No fevers, chills, nausea, vomiting, or diarrhea. 09/14/2021 upon evaluation today patient appears to be doing well with regard to his leg ulcer. He has been tolerating the dressing changes and overall I think that he is making excellent progress. I do not see any signs of active infection at this time. 09/21/2021 upon evaluation today patient actually appears to be making good progress with regard to his wound this is again measuring smaller today no debridement seems to be necessary. We have been using [name] silver collagen dressing and I think that is doing an awesome job. 09/28/2021 upon evaluation today patient appears to be doing well with regard to his leg currently. I do not see any signs of active infection at this time which is great news. No fevers, chills, nausea, vomiting, or diarrhea. I think this wound is very close to complete resolution. 10/12/2021 upon evaluation today patient actually appears to be  doing awesome in regard to his leg ulcer. In fact this appears to be completely healed based on what I am seeing currently. I do not see any evidence of active infection locally nor systemically at this time which is also great news. No fevers, chills, nausea, vomiting, or diarrhea. Readmission: 12/07/2021 upon evaluation today patient presents for readmission here in the clinic. He was discharged on 10/12/2021 is completely healed. Unfortunately this has reopened at this point and he is having continual issues with new blisters over both lower extremities. This is even worse than what we previously saw. Nonetheless we did actually check his ABIs today and it did reveal that his ABIs were 0.55 on the left and 0.57 on the right. Subsequently this is [name] definite change from his last arterial study which showed that he did have good blood flow at 1.01 on the right and 0.92 on the left and that was right at the beginning of 2019. Nonetheless based on what we see currently I do think he tolerated the 3 layer compression wrap but I do believe that we probably need to get him tested for his arterial flow in order to see where things stand and if there is something we can do there that would help prevent this from continue to be an ongoing issue. He did not utilize compression socks in the interim from when he was last here till this time. That something is probably going to need lifelong going forward as well. 3/9; patient presents for follow-up. He has no issues or complaints today. He tolerated the compression wrap well. He had ABIs with TBI's done. He  denies signs of infection. 12/21/2021 upon evaluation today patient appears to be doing well with regard to the wounds on his legs. Both are showing signs of significant improvement which is great news although I do believe some sharp debridement would be of benefit here as well. 12/28/2021 upon evaluation today patient appears to be doing well with regard to his  wounds. Everything is showing signs of excellent improvement which I am very pleased about. I think that we are headed in the right direction here. Fortunately there does not appear to be any evidence of infection which is great news there is [name] little bit of hypergranulation. 01/04/2022 upon evaluation today patient appears to be doing well with regard to his wounds 2 of them are healed 1 is almost so and the other 1 is significantly better. Overall I am extremely pleased with where we stand and I think that he is making excellent progress here. I do not see any evidence of active infection locally nor systemically at this time. 01-16-2022 upon evaluation today patient's wound on the left leg is showing signs of doing quite well. Has not completely cleared at this point but it is much improved. Fortunately I do not see any signs of infection at this time. No fevers, chills, nausea, vomiting, or diarrhea. 01-23-2022 upon evaluation today patient's wound of the left leg actually appears to be pretty much completely healed which is great news. I do not see any signs of active infection locally or systemically which is excellent. With that being said on the right leg what wound is measuring smaller the other 1 is [name] new wound that just showed up fortunately its not too bad. Has been using Xeroform here and that seems to be doing decently well which is great news. Unfortunately his blood pressure is significantly high we gave him the readings for the past 4-5 visits as well as [name] recommendation to make an appointment to go discuss this with his primary care provider patient states that he is going to look into doing this. 01-30-2022 upon evaluation today patient appears to be doing well with regard to his left leg everything appears to be healed. On the right leg the more anterior wound is healed the more medial wound that I been concerned about [name] possible skin cancer unfortunately still does not look great to  me. I do believe that we should probably do [name] biopsy I have talked about it with him [name] few times I think though it is probably time to go ahead and do this at this point. 02-09-2022 upon evaluation today patient appears to be doing well with regard to his legs. On the left this appears to be completely healed. On the right he does have 2 areas and be perfectly honest one of them is [name] skin cancer that he is going to the Mohs surgery clinic for the other seems to be healing nicely. Readmission: 08-02-2022 upon evaluation today patient appears for reevaluation here in our clinic concerning issues that he has been having with wounds over the bilateral lower extremities. I last saw him in May 2023 and at that point we had him completely healed. Unfortunately he is tells me this has broken down to some degree since that point. 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 He tells me this 1 seems to just be getting bigger and bigger not improving. Fortunately he is not having any significant pain which is good news he does have quite [name] bit of swelling and he tells me that his fluid pills are not recommended for him to take daily but just in 3-day intervals here and there. 08-09-2022 upon evaluation today patient appears to be doing still somewhat poorly in regard to his legs although in general he does not appear to be feeling as good as he has been. Fortunately there does not appear to be any signs of infection which is good news. With that being said he is having some issues here with having and overall poor feeling in general which again is good I think going to be the biggest complicating factor. He actually seems to be coughing I do not hear any wheezing right now I did listen to his chest he did not have good  airflow down low however makes me suspicious for bronchitis or even possibly pneumonia which could be part of what is going on here as well. Fortunately I do not see any evidence of anything worsening in regard to his legs but I definitely believe that he needs to continue with the compression wraps he took them off yesterday to shower has not had anything on for 24 hours that is why his legs are so swollen today. [name], [name] ([id number]) [id number] Page 3 of 9 With regard to his pathology report I did review that it showed some squamous abnormality but no signs of distinct carcinoma. With that being said it was saying that it could be adjacent to [name] squamous cell carcinoma nonetheless my suggestion is can be that we have the patient take copy of this report and give it to his Mohs surgeon in order for them to see if there is anything they feel like needs to be done further. With that being said right now I feel like the primary thing is going to be for [place] to try to get his swelling down and keep that down into that hand since he is having so much drainage I believe we can have to bring him in for dressing changes twice [name] week doing [name] nurse visit on Mondays. 11/9; since the patient was last here he spent the night in the emergency room he received IV Lasix. Also received antibiotics although he was not discharged on either 1 of these. He also saw his cardiology office who put him on regular Lasix 20 mg [previously on as needed Lasix 20 mg]. Per our intake nurse the swelling in his legs is remarkably better but he still has bilateral lower extremity wounds. He still has wounds on the bilateral lower extremities most problematically on the left lateral calf. He has been using silver alginate under 3 layer compression. 08-23-2022 upon evaluation today patient appears to be doing much better than the last time I saw him 2 weeks ago. At that point I was very concerned  about how he was doing he did see Dr. Caryn Section his primary care provider they got him on some blood pressure medication in general his color and overall appearance looks to be doing much improved compared to the last time I saw him. 09-04-2022 upon evaluation today patient appears to be doing well currently in regard to his wounds. Everything is showing signs of improvement which is great news. Fortunately there does not appear to be any signs of active  infection locally or systemically at this time. No fevers, chills, nausea, vomiting, or diarrhea. 09-10-2022 upon evaluation today patient appears to be doing better in regard to his wounds although the [facility] was extremely stuck to the wound bed. Fortunately there does not appear to be any signs of infection locally or systemically at this time which is great news. No fevers, chills, nausea, vomiting, or diarrhea. 09-17-2022 upon evaluation today patient appears to be doing well currently in regard to his wounds in general. The right leg actually showing signs of excellent improvement and very pleased with where things stand in that regard. Fortunately I do not see any evidence of infection locally or systemically at this time which is great news. No fevers, chills, nausea, vomiting, or diarrhea. 09-24-2022 upon evaluation today patient appears to be doing well currently in regard to his wounds. Things look to be doing quite well. 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 He is seeing his dermatologist tomorrow in that regard. With that being said other than that however he seems to really be making some pretty good progress here which is good news. No fevers, chills, nausea, vomiting, or diarrhea. 12/26; the patient has 2 open wounds remaining on the left leg. One is on the left anterior mid tibia and the other is  on the right lateral knee just outside of the popliteal fossa. 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 The patient has been to see dermatology Dr. Evorn Gong who apparently is making him [name] referral to the [facility]. He does not yet have an appointment 10-12-2022 upon evaluation today patient appears to be doing well currently in regard to his wound. He has been tolerating the dressing changes without complication and overall feel like we are headed in the right direction. Fortunately I do not see any signs of infection locally or systemically at this time which is great news. No fevers, chills, nausea, vomiting, or diarrhea. Electronic Signature(s) Signed: 10/12/2022 8:38:08 AM By: [name] PA-C Entered By: [name] on 10/12/2022 08:38:08 -------------------------------------------------------------------------------- Physical Exam Details Patient Name: Date of Service: [name] Haralson, [name] 10/12/2022 7:45 [name] Medical Record Number: [id number] Patient Account Number: [id number] Date of Birth/Sex: Treating RN: [birth year] (77 y.o. [name] Primary Care Provider: [name] Other Clinician: Referring Provider: Treating Provider/Extender: [name] in Treatment: 48 Constitutional Well-nourished and well-hydrated in no acute distress. Respiratory normal breathing without difficulty. Psychiatric this patient is able to make decisions and demonstrates good insight into disease process. Alert and Oriented x 3. pleasant and cooperative. Notes Upon inspection patient's wound bed actually showed signs again of good granulation and epithelization the area of the skin cancer is good to be seeing the Mohs surgeon for on Thursday of next week we will see him for nurse visit on Friday. [name], [name] ([id number]) [id number] Page 4 of 9 Electronic Signature(s) Signed: 10/12/2022  8:38:22 AM By: [name] PA-C Entered By: [name] on 10/12/2022 08:38:22 -------------------------------------------------------------------------------- Physician Orders Details Patient Name: Date of Service: [name] [name] N, [name] 10/12/2022 7:45 [name] Medical Record Number: [id number] Patient Account Number: [id number] Date of Birth/Sex: Treating RN: [birth year] (77 y.o. [name] Primary Care Provider: [name] Other Clinician: Referring Provider: Treating Provider/Extender: Jeri Cos  Verdia Kuba in Treatment: 10 Verbal / Phone Orders: No Diagnosis Coding ICD-10 Coding Code Description 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 L97.822 Non-pressure chronic ulcer of other part of left lower leg with fat layer exposed L97.812 Non-pressure chronic ulcer of other part of right lower leg with fat layer exposed I73.89 Other specified peripheral vascular diseases I48.0 Paroxysmal atrial fibrillation I10 Essential (primary) hypertension Follow-up Appointments Return Appointment in 1 week. Bathing/ Shower/ Hygiene May shower with wound dressing protected with water repellent cover or cast protector. No tub bath. Anesthetic (Use 'Patient Medications' Section for Anesthetic Order Entry) Wound #9 Left,Lateral Lower Leg Lidocaine applied to wound bed Edema Control - Lymphedema / Segmental Compressive Device / Other Bilateral Lower Extremities 3 Layer Compression System for Lymphedema. - left lower leg Patient to wear own compression stockings. Remove compression stockings every night before going to bed and put on every morning when getting up. - right lower leg Elevate, Exercise Daily and [name] void Standing for Long Periods of Time. Elevate legs to the level of the heart and pump ankles as often as possible Elevate leg(s) parallel to the floor when sitting. DO YOUR BEST to sleep in the bed at  night. DO NOT sleep in your recliner. Long hours of sitting in [name] recliner leads to swelling of the legs and/or potential wounds on your backside. Additional Orders / Instructions Follow Nutritious Diet and Increase Protein Intake Wound Treatment Wound #15 - Lower Leg Wound Laterality: Left, Posterior Prim Dressing: Silvercel Small 2x2 (in/in) 1 x Per Day/30 Days ary Discharge Instructions: Apply Silvercel Small 2x2 (in/in) as instructed Secondary Dressing: Coverlet Latex-Free Fabric Adhesive Dressings 1 x Per Day/30 Days Discharge Instructions: 1.5 x 2 Wound #9 - Lower Leg Wound Laterality: Left, Lateral Prim Dressing: Silvercel Small 2x2 (in/in) 1 x Per Week/30 Days ary Discharge Instructions: Apply Silvercel Small 2x2 (in/in) as instructed [name], [name] ([id number]) [id number] Page 5 of 9 Secondary Dressing: Zetuvit Plus 4x8 (in/in) 1 x Per Week/30 Days Secured With: [employer] Sterile or Non-Sterile 6-ply 4.5x4 (yd/yd) 1 x Per Week/30 Days Discharge Instructions: Apply Kerlix as directed Compression Wrap: 3-LAYER WRAP - Profore Lite LF 3 Multilayer Compression Bandaging System 1 x Per Week/30 Days Discharge Instructions: Apply 3 multi-layer wrap as prescribed. Electronic Signature(s) Signed: 10/12/2022 1:38:11 PM By: [name] PA-C Signed: 10/12/2022 1:40:16 PM By: [name] MSN RN CNS WTA Entered By: [name] on 10/12/2022 12:10:14 -------------------------------------------------------------------------------- Problem List Details Patient Name: Date of Service: [name], [name] 10/12/2022 7:45 [name] Medical Record Number: [id number] Patient Account Number: [id number] Date of Birth/Sex: Treating RN: 10-12-1945 (77 y.o. [name] Primary Care Provider: [name] Other Clinician: Referring Provider: Treating Provider/Extender: [name] in Treatment: 10 Active Problems ICD-10 Encounter Code Description  Active Date MDM Diagnosis [phone] Chronic venous hypertension (idiopathic) with ulcer and inflammation of 08/02/2022 No Yes bilateral lower extremity L97.822 Non-pressure chronic ulcer of other part of left lower leg with fat layer 08/02/2022 No Yes exposed L97.812 Non-pressure chronic ulcer of other part of right lower leg with fat layer 08/02/2022 No Yes exposed I73.89 Other specified peripheral vascular diseases 08/02/2022 No Yes I48.0 Paroxysmal atrial fibrillation 08/02/2022 No Yes I10 Essential (primary) hypertension 08/02/2022 No Yes Inactive Problems Resolved Problems KAWIKA, BISCHOFF ([id number]) [id number] Page 6 of 9 Electronic Signature(s) Signed: 10/12/2022 12:11:01 PM By: [name] MSN RN CNS WTA Signed: 10/12/2022 1:38:11 PM By:  [name] PA-C Previous Signature: 10/12/2022 8:04:35 AM Version By: [name] PA-C Entered By: [name] on 10/12/2022 12:11:01 -------------------------------------------------------------------------------- Progress Note Details Patient Name: Date of Service: [name] 10/12/2022 7:45 [name] Medical Record Number: [id number] Patient Account Number: [id number] Date of Birth/Sex: Treating RN: 1946-05-13 (77 y.o. [name] Primary Care Provider: [name] Other Clinician: Referring Provider: Treating Provider/Extender: [name] in Treatment: 10 Subjective Chief Complaint Information obtained from Patient Bilateral LE Ulcers History of Present Illness (HPI) 77 year old male who has [name] past medical history of essential hypertension, chronic atrial fibrillation, peripheral vascular disease, nonischemic cardiomyopathy,venous stasis dermatitis, gouty arthropathy, basal cell carcinoma of the right lower extremity, benign prostatic hypertrophy, long-term use of anticoagulation therapy, hyperglycemia and exercise intolerance has never been [name] smoker. the patient has had  [name] vascular workup over 7 years ago and said everything was normal at that stage. He does not have any chronic problems except for cardiac issues which he sees [name] cardiologist in [place]. 08/15/2017 -- arterial and venous duplex studies still pending. 08/23/2017 -- venous reflux studies done on 08/13/2017 shows venous incompetence throughout the left lower extremity deep system and focally at the left saphenofemoral junction. 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 No evidence of SVT or DVT in bilateral lower extremities The patient has an appointment at the end of the month to get his arterial duplex study done 09/05/2017 -- the patient was seen at the vein and vascular office yesterday by Angelena Form. ABI studies were notable for medial calcification and the toe brachial indices were normal and bilateral ankle-brachial) waveforms were normal with triphasic flow. After review of his venous studies he was not [name] candidate for laser ablation and his lymphedema was to be treated with compression stockings and lymphedema pump pumps 09/12/2017 -- had [name] low arterial study done at the  vein and vascular surgery -- unable to obtain reliable ABI is due to medial calcification. Bilateral toe indices were normal with the right being 1.01 and the left being 0.92 and the waveforms were triphasic bilaterally. he did get hold of 30-40 mm compression stockings but is unable to put these on. We will try and get him alternative compression stockings. 09/26/17- he is here in follow up evaluation of [name] right lower extremity ulcer;he is compliant in wearing compression stocking; ulcer almost epithelialized , anticipate healing next appointment Readmission: 11/17 point upon evaluation patient's wound currently that he is seeing [place] for today is [name] skin cancerous lesion that was cleared away by his dermatologist on the left medial calf region. He tells me that this is [name] very similar thing to  what he had done previously in fact the last time he saw him in 2018 this was also what was going on at that point. Nonetheless he feels that based on what he seeing currently that this is just having [name] lot of harder time healing although it is much closer to the surface than what he is experienced in the past. He notes that the initial removal was in June 2022 which was this year this is now November and still has not closed. He does have some edema and definitely I think that there is some venous component to his slow healing here. Also think that we can do something better than Vaseline to try to help with getting this to clear up as quickly as possible. He does have [name] history of atrial  fibrillation and is on Eliquis otherwise he really has no major medical problems that would affect wound healing. 09/07/2021 upon evaluation today patient actually appears to be doing significantly better after having wrapped him last week. Overall I think that this is making significant improvements at this time which is great news. I do not see any evidence of infection which is great news as well. No fevers, chills, nausea, vomiting, or diarrhea. 09/14/2021 upon evaluation today patient appears to be doing well with regard to his leg ulcer. He has been tolerating the dressing changes and overall I think that he is making excellent progress. I do not see any signs of active infection at this time. 09/21/2021 upon evaluation today patient actually appears to be making good progress with regard to his wound this is again measuring smaller today no debridement seems to be necessary. We have been using [name] silver collagen dressing and I think that is doing an awesome job. 09/28/2021 upon evaluation today patient appears to be doing well with regard to his leg currently. I do not see any signs of active infection at this time which is great news. No fevers, chills, nausea, vomiting, or diarrhea. I think this wound is very  close to complete resolution. 10/12/2021 upon evaluation today patient actually appears to be doing awesome in regard to his leg ulcer. In fact this appears to be completely healed based on what I am seeing currently. I do not see any evidence of active infection locally nor systemically at this time which is also great news. No fevers, chills, [name], [name] ([id number]) [id number] Page 7 of 9 nausea, vomiting, or diarrhea. Readmission: 12/07/2021 upon evaluation today patient presents for readmission here in the clinic. He was discharged on 10/12/2021 is completely healed. Unfortunately this has reopened at this point and he is having continual issues with new blisters over both lower extremities. This is even worse than what we previously saw. Nonetheless we did actually check his ABIs today and it did reveal that his ABIs were 0.55 on the left and 0.57 on the right. Subsequently this is [name] definite change from his last arterial study which showed that he did have good blood flow at 1.01 on the right and 0.92 on the left and that was right at the beginning of 2019. Nonetheless based on what we see currently I do think he tolerated the 3 layer compression wrap but I do believe that we probably need to get him tested for his arterial flow in order to see where things stand and if there is something we can do there that would help prevent this from continue to be an ongoing issue. He did not utilize compression socks in the interim from when he was last here till this time. That something is probably going to need lifelong going forward as well. 3/9; patient presents for follow-up. He has no issues or complaints today. He tolerated the compression wrap well. He had ABIs with TBI's done. He denies signs of infection. 12/21/2021 upon evaluation today patient appears to be doing well with regard to the wounds on his legs. Both are showing signs of significant improvement which is  great news although I do believe some sharp debridement would be of benefit here as well. 12/28/2021 upon evaluation today patient appears to be doing well with regard to his wounds. Everything is showing signs of excellent improvement which I am very pleased about. I think that we are headed in the right direction here. Fortunately  there does not appear to be any evidence of infection which is great news there is [name] little bit of hypergranulation. 01/04/2022 upon evaluation today patient appears to be doing well with regard to his wounds 2 of them are healed 1 is almost so and the other 1 is significantly better. Overall I am extremely pleased with where we stand and I think that he is making excellent progress here. I do not see any evidence of active infection locally nor systemically at this time. 01-16-2022 upon evaluation today patient's wound on the left leg is showing signs of doing quite well. Has not completely cleared at this point but it is much improved. Fortunately I do not see any signs of infection at this time. No fevers, chills, nausea, vomiting, or diarrhea. 01-23-2022 upon evaluation today patient's wound of the left leg actually appears to be pretty much completely healed which is great news. I do not see any signs of active infection locally or systemically which is excellent. With that being said on the right leg what wound is measuring smaller the other 1 is [name] new wound that just showed up fortunately its not too bad. Has been using Xeroform here and that seems to be doing decently well which is great news. Unfortunately his blood pressure is significantly high we gave him the readings for the past 4-5 visits as well as [name] recommendation to make an appointment to go discuss this with his primary care provider patient states that he is going to look into doing this. 01-30-2022 upon evaluation today patient appears to be doing well with regard to his left leg everything appears to be  healed. On the right leg the more anterior wound is healed the more medial wound that I been concerned about [name] possible skin cancer unfortunately still does not look great to me. I do believe that we should probably do [name] biopsy I have talked about it with him [name] few times I think though it is probably time to go ahead and do this at this point. 02-09-2022 upon evaluation today patient appears to be doing well with regard to his legs. On the left this appears to be completely healed. On the right he does have 2 areas and be perfectly honest one of them is [name] skin cancer that he is going to the Mohs surgery clinic for the other seems to be healing nicely. Readmission: 08-02-2022 upon evaluation today patient appears for reevaluation here in our clinic concerning issues that he has been having with wounds over the bilateral lower extremities. I last saw him in May 2023 and at that point we had him completely healed. Unfortunately he is tells me this has broken down to some degree since that point. 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 He tells me this 1 seems to just be getting bigger and bigger not improving. Fortunately he is not having any significant pain which is good news he does have quite [name] bit of swelling and he tells me that his fluid pills are not recommended for him to take daily but just in 3-day intervals here and there. 08-09-2022 upon evaluation today patient appears to be doing still somewhat poorly in regard to his legs although in general he does not appear to be  feeling as good as he has been. Fortunately there does not appear to be any signs of infection which is good news. With that being said he is having some issues here with having and overall poor feeling in general which again is  good I think going to be the biggest complicating factor. He actually seems to be coughing I do not hear any wheezing right now I did listen to his chest he did not have good airflow down low however makes me suspicious for bronchitis or even possibly pneumonia which could be part of what is going on here as well. Fortunately I do not see any evidence of anything worsening in regard to his legs but I definitely believe that he needs to continue with the compression wraps he took them off yesterday to shower has not had anything on for 24 hours that is why his legs are so swollen today. With regard to his pathology report I did review that it showed some squamous abnormality but no signs of distinct carcinoma. With that being said it was saying that it could be adjacent to [name] squamous cell carcinoma nonetheless my suggestion is can be that we have the patient take copy of this report and give it to his Mohs surgeon in order for them to see if there is anything they feel like needs to be done further. With that being said right now I feel like the primary thing is going to be for [place] to try to get his swelling down and keep that down into that hand since he is having so much drainage I believe we can have to bring him in for dressing changes twice [name] week doing [name] nurse visit on Mondays. 11/9; since the patient was last here he spent the night in the emergency room he received IV Lasix. Also received antibiotics although he was not discharged on either 1 of these. He also saw his cardiology office who put him on regular Lasix 20 mg [previously on as needed Lasix 20 mg]. Per our intake nurse the swelling in his legs is remarkably better but he still has bilateral lower extremity wounds. He still has wounds on the bilateral lower extremities most problematically on the left lateral calf. He has been using silver alginate under 3 layer compression. 08-23-2022 upon evaluation today patient appears to be doing  much better than the last time I saw him 2 weeks ago. At that point I was very concerned about how he was doing he did see Dr. Caryn Section his primary care provider they got him on some blood pressure medication in general his color and overall appearance looks to be doing much improved compared to the last time I saw him. 09-04-2022 upon evaluation today patient appears to be doing well currently in regard to his wounds. Everything is showing signs of improvement which is great news. Fortunately there does not appear to be any signs of active infection locally or systemically at this time. No fevers, chills, nausea, vomiting, or diarrhea. 09-10-2022 upon evaluation today patient appears to be doing better in regard to his wounds although the [facility] was extremely stuck to the wound bed. Fortunately there does not appear to be any signs of infection locally or systemically at this time which is great news. No fevers, chills, nausea, vomiting, or diarrhea. 09-17-2022 upon evaluation today patient appears to be doing well currently in regard to his wounds in general. The right leg actually showing signs of excellent improvement and very  pleased with where things stand in that regard. Fortunately I do not see any evidence of infection locally or systemically at this time which is great news. No fevers, chills, nausea, vomiting, or diarrhea. 09-24-2022 upon evaluation today patient appears to be doing well currently in regard to his wounds. Things look to be doing quite well. 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 He is seeing his dermatologist tomorrow in that regard. With that being said other than that however he seems to really be making some pretty good progress here which is good news. No fevers, chills, nausea, vomiting, or diarrhea. [name], [name] ([id number])  [id number] Page 8 of 9 12/26; the patient has 2 open wounds remaining on the left leg. One is on the left anterior mid tibia and the other is on the right lateral knee just outside of the popliteal fossa. 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 The patient has been to see dermatology Dr. Evorn Gong who apparently is making him [name] referral to the [facility] Mohs surgery center. He does not yet have an appointment 10-12-2022 upon evaluation today patient appears to be doing well currently in regard to his wound. He has been tolerating the dressing changes without complication and overall feel like we are headed in the right direction. Fortunately I do not see any signs of infection locally or systemically at this time which is great news. No fevers, chills, nausea, vomiting, or diarrhea. Objective Constitutional Well-nourished and well-hydrated in no acute distress. Vitals Time Taken: 7:44 AM, Height: 75 in, Weight: 225 lbs, BMI: 28.1, Temperature: 98.0 F, Pulse: 47 bpm, Respiratory Rate: 16 breaths/min, Blood Pressure: 121/46 mmHg. Respiratory normal breathing without difficulty. Psychiatric this patient is able to make decisions and demonstrates good insight into disease process. Alert and Oriented x 3. pleasant and cooperative. General Notes: Upon inspection patient's wound bed actually showed signs again of good granulation and epithelization the area of the skin cancer is good to be seeing the Mohs surgeon for on Thursday of next week we will see him for nurse visit on Friday. Integumentary (Hair, Skin) Wound #15 status is Open. Original cause of wound was Gradually Appeared. The date acquired was: 08/28/2022. The wound has been in treatment 5 weeks. The wound is located on the Left,Posterior Lower Leg. The wound measures 0.6cm length x 2cm width x 0.1cm depth; 0.942cm^2 area and 0.094cm^3 volume. There is Fat Layer (Subcutaneous  Tissue) exposed. There is [name] medium amount of sanguinous drainage noted. Foul odor after cleansing was noted. There is no granulation within the wound bed. There is no necrotic tissue within the wound bed. Wound #9 status is Open. Original cause of wound was Gradually Appeared. The date acquired was: 01/20/2022. The wound has been in treatment 10 weeks. The wound is located on the Left,Lateral Lower Leg. The wound measures 3.1cm length x 2.5cm width x 0.1cm depth; 6.087cm^2 area and 0.609cm^3 volume. There is Fat Layer (Subcutaneous Tissue) exposed. There is [name] large amount of serosanguineous drainage noted. The wound margin is thickened. There is medium (34-66%) red, friable, hyper - granulation within the wound bed. There is [name] medium (34-66%) amount of necrotic tissue within the wound bed including Adherent Slough. 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 I would recommend currently that we have the patient continue to monitor for any signs of infection or worsening. Obviously if anything changes he knows he can contact the office and let me know. 2. Also can recommend that we have the patient continue to use [name] silver cell which does seem to be doing well currently. 3. I am also can recommend the patient should continue to elevate his legs much as possible is good using compression sock on the right leg and on the left leg he is using the compression wrapping obviously that were put in place. We will see patient back for reevaluation in 1 week here in the clinic. If anything worsens or changes patient will contact our office for  additional recommendations. Electronic Signature(s) Signed: 10/12/2022 8:55:48 AM By: [name] PA-C Entered By: [name] on 10/12/2022 08:55:47 [name], [name] ([id number]) [id number] Page 9 of 9 -------------------------------------------------------------------------------- SuperBill Details Patient Name: Date of Service: [name] [name] 10/12/2022 Medical Record Number: [id number] Patient Account Number: [id number] Date of Birth/Sex: Treating RN: [birth year] (77 y.o. [name] Primary Care Provider: [name] Other Clinician: Referring Provider: Treating Provider/Extender: [name] in Treatment: 10 Diagnosis Coding ICD-10 Codes Code Description [phone] Chronic venous hypertension (idiopathic) with ulcer and inflammation of bilateral lower extremity L97.822 Non-pressure chronic ulcer of other part of left lower leg with fat layer exposed L97.812 Non-pressure chronic ulcer of other part of right lower leg with fat layer exposed I73.89 Other specified peripheral vascular diseases I48.0 Paroxysmal atrial fibrillation I10 Essential (primary) hypertension Facility Procedures : CPT4 Code: [id number] Description: [place] VISIT-LEV 3 EST PT Modifier: Quantity: 1 : CPT4 Code: 80165537 Description: (Facility Use Only) [id number] - [place] LWR LT LEG Modifier: Quantity: 1 Physician Procedures : CPT4 Code Description Modifier [id number] - WC PHYS LEVEL 3 - EST PT ICD-10 Diagnosis Description 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 L97.822 Non-pressure chronic ulcer of other  part of left lower leg with fat layer exposed L97.812 Non-pressure chronic ulcer of other part of right lower leg with fat layer exposed I73.89 Other specified peripheral vascular diseases Quantity: 1 Electronic Signature(s) Signed: 10/12/2022 12:10:40  PM By: [name] MSN RN CNS WTA Signed: 10/12/2022 1:38:11 PM By: [name] PA-C Previous Signature: 10/12/2022 8:56:04 AM Version By: [name] PA-C Entered By: [name] on 10/12/2022 12:10:39

## 2022-10-12 NOTE — Progress Notes (Signed)
[name], COMELLA [name] ([id number]) [id number] Page 1 of 12 Visit Report for 10/12/2022 Arrival Information Details Patient Name: Date of Service: [name] 10/12/2022 7:45 A M Medical Record Number: [id number] Patient Account Number: [id number] Date of Birth/Sex: Treating RN: Sep 22, 1946 (77 y.o. [name] Primary Care Siraj Dermody: [name] Other Clinician: Referring Unice Vantassel: Treating Tadarrius Burch/Extender: [name] in Treatment: 10 Visit Information History Since Last Visit Added or deleted any medications: No Patient Arrived: Ambulatory Any new allergies or adverse reactions: No Arrival Time: 07:39 Had a fall or experienced change in No Accompanied By: self activities of daily living that may affect Transfer Assistance: None risk of falls: Patient Identification Verified: Yes Hospitalized since last visit: No Secondary Verification Process Completed: Yes Pain Present Now: No Patient Requires Transmission-Based No Precautions: Patient Has Alerts: Yes Patient Alerts: Patient on Blood Thinner 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 10/12/2022 7:45 A M Medical Record Number: [id number] Patient Account Number: [id number] Date of Birth/Sex: Treating RN: Feb 12, 1946 (77 y.o. [name] Primary Care Dominico Rod: [name] Other Clinician: Referring Ranette Luckadoo: Treating Meghann Landing/Extender: [name] in Treatment: 10 Clinic Level of Care Assessment Items TOOL 4 Quantity Score X- 1 0 Use when only an EandM is performed on FOLLOW-UP visit ASSESSMENTS - Nursing  Assessment / Reassessment X- 1 10 Reassessment of Co-morbidities (includes updates in patient status) X- 1 5 Reassessment of Adherence to Treatment Plan AZIM, GILLINGHAM [name] ([id number]) [id number] Page 2 of 12 ASSESSMENTS - Wound and Skin A ssessment / Reassessment '[]'$  - 0 Simple Wound Assessment / Reassessment - one wound X- 2 5 Complex Wound Assessment / Reassessment - multiple wounds '[]'$  - 0 Dermatologic / Skin Assessment (not related to wound area) ASSESSMENTS - Focused Assessment '[]'$  - 0 Circumferential Edema Measurements - multi extremities '[]'$  - 0 Nutritional Assessment / Counseling / Intervention '[]'$  - 0 Lower Extremity Assessment (monofilament, tuning fork, pulses) '[]'$  - 0 Peripheral Arterial Disease Assessment (using hand held doppler) ASSESSMENTS - Ostomy and/or Continence Assessment and Care '[]'$  - 0 Incontinence Assessment and Management '[]'$  - 0 Ostomy Care Assessment and Management (repouching, etc.) PROCESS - Coordination of Care '[]'$  - 0 Simple Patient / Family Education for ongoing care X- 1 20 Complex (extensive) Patient / Family Education for ongoing care X- 1 10 Staff obtains Programmer, systems, Records, T Results / Process Orders est '[]'$  - 0 Staff telephones HHA, Nursing Homes / Clarify orders / etc '[]'$  - 0 Routine Transfer to another Facility (non-emergent condition) '[]'$  - 0 Routine Hospital Admission (non-emergent condition) '[]'$  - 0 New Admissions / Biomedical engineer / Ordering NPWT Apligraf, etc. , '[]'$  - 0 Emergency Hospital Admission (emergent condition) '[]'$  - 0 Simple Discharge Coordination X- 1 15 Complex (extensive) Discharge Coordination PROCESS - Special Needs '[]'$  - 0 Pediatric / Minor Patient Management '[]'$  - 0 Isolation Patient Management '[]'$  - 0 Hearing / Language / Visual special needs '[]'$  - 0 Assessment of Community assistance (transportation, [name], etc.) '[]'$  - 0 Additional assistance / Altered mentation '[]'$  -  0 Support Surface(s) Assessment (bed, cushion, seat, etc.) INTERVENTIONS - Wound Cleansing / Measurement '[]'$  - 0 Simple Wound Cleansing - one wound X- 2 5 Complex Wound Cleansing - multiple  wounds '[]'$  - 0 Wound Imaging (photographs - any number of wounds) '[]'$  - 0 Wound Tracing (instead of photographs) '[]'$  - 0 Simple Wound Measurement - one wound X- 2 5 Complex Wound Measurement - multiple wounds INTERVENTIONS - Wound Dressings X - Small Wound Dressing one or multiple wounds 2 10 '[]'$  - 0 Medium Wound Dressing one or multiple wounds '[]'$  - 0 Large Wound Dressing one or multiple wounds '[]'$  - 0 Application of Medications - topical '[]'$  - 0 Application of Medications - injection INTERVENTIONS - Miscellaneous '[]'$  - 0 External ear exam ALGENIS, BALLIN ([id number]) [id number] Page 3 of 12 '[]'$  - 0 Specimen Collection (cultures, biopsies, blood, body fluids, etc.) '[]'$  - 0 Specimen(s) / Culture(s) sent or taken to Lab for analysis '[]'$  - 0 Patient Transfer (multiple staff / Harrel Lemon Lift / Similar devices) '[]'$  - 0 Simple Staple / Suture removal (25 or less) '[]'$  - 0 Complex Staple / Suture removal (26 or more) '[]'$  - 0 Hypo / Hyperglycemic Management (close monitor of Blood Glucose) '[]'$  - 0 Ankle / Brachial Index (ABI) - do not check if billed separately X- 1 5 Vital Signs Has the patient been seen at the hospital within the last three years: Yes Total Score: 115 Level Of Care: New/Established - Level 3 Electronic Signature(s) Signed: 10/12/2022 1:40:16 PM By: [name] MSN RN CNS WTA Entered By: [name] on 10/12/2022 12:10:32 -------------------------------------------------------------------------------- Compression Therapy Details Patient Name: Date of Service: [name], [name] 10/12/2022 7:45 A M Medical Record Number: [id number] Patient Account Number: [id number] Date of Birth/Sex: Treating RN: 05-Mar-1946 (77 y.o. [name] Primary Care Swara Donze:  [name] Other Clinician: Referring Eliyas Suddreth: Treating Ebonique Hallstrom/Extender: [name] in Treatment: 10 Compression Therapy Performed for Wound Assessment: Wound #9 Left,Lateral Lower Leg Performed By: Clinician [name], RN Compression Type: Three Layer Post Procedure Diagnosis Same as Pre-procedure Electronic Signature(s) Signed: 10/12/2022 12:09:59 PM By: [name] MSN RN CNS WTA Entered By: [name] on 10/12/2022 12:09:59 -------------------------------------------------------------------------------- Encounter Discharge Information Details Patient Name: Date of Service: [name], [name] 10/12/2022 7:45 A M Medical Record Number: [id number] Patient Account Number: [id number] Date of Birth/Sex: Treating RN: 1946/03/16 (77 y.o. Charleton, Deyoung, Ely [name] ([id number]) [id number] Page 4 of 12 Primary Care Gloria Lambertson: [name] Other Clinician: Referring Schuyler Olden: Treating Alysse Rathe/Extender: [name] in Treatment: 10 Encounter Discharge Information Items Discharge Condition: Stable Ambulatory Status: Ambulatory Discharge Destination: Home Transportation: Private Auto Accompanied By: self Schedule Follow-up Appointment: Yes Clinical Summary of Care: Electronic Signature(s) Signed: 10/12/2022 12:11:36 PM By: [name] MSN RN CNS WTA Entered By: [name] on 10/12/2022 12:11:35 -------------------------------------------------------------------------------- Lower Extremity Assessment Details Patient Name: Date of Service: [name], [name] 10/12/2022 7:45 A M Medical Record Number: [id number] Patient Account Number: [id number] Date of Birth/Sex: Treating RN: Oct 13, 1945 (77 y.o. [name] Primary Care Shayna Eblen: [name] Other Clinician: Referring Brae Schaafsma: Treating Eevie Lapp/Extender: [name] in Treatment: 10 Edema Assessment Assessed:  Shirlyn Goltz: No] Patrice Paradise: No] [Left: Edema] [Right: :] Calf Left: Right: Point of Measurement: 34 cm From Medial Instep 36 cm Ankle Left: Right: Point of Measurement: 12 cm From Medial Instep 23 cm Vascular Assessment Pulses: Dorsalis Pedis Palpable: [Left:Yes] Electronic Signature(s) Signed: 10/12/2022 1:40:16 PM By: [name] MSN RN CNS WTA Entered By: [name] on 10/12/2022 08:47:44 Scheffel, Artavis [name] ([id number]) [id number] Page 5 of 12 -------------------------------------------------------------------------------- Multi Wound Chart Details Patient Name:  Date of Service: [name] 10/12/2022 7:45 A M Medical Record Number: [id number] Patient Account Number: [id number] Date of Birth/Sex: Treating RN: 10/08/1946 (77 y.o. [name] Primary Care Delorise Hunkele: [name] Other Clinician: Referring Stoy Fenn: Treating Qunicy Higinbotham/Extender: [name] in Treatment: 10 Vital Signs Height(in): 75 Pulse(bpm): 43 Weight(lbs): 225 Blood Pressure(mmHg): 121/46 Body Mass Index(BMI): 28.1 Temperature(F): 98.0 Respiratory Rate(breaths/min): 16 [15:Photos:] [[name]:[name]] Left, Posterior Lower Leg Left, Lateral Lower Leg [name] Wound Location: Gradually Appeared Gradually Appeared [name] 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 [name] Primary Etiology: Arrhythmia, Hypertension, Gout Arrhythmia, Hypertension, Gout [name] Comorbid History: 08/28/2022 01/20/2022 [name] Date Acquired: 5 10 [name] Weeks of Treatment: Open Open [name] Wound Status: No No [name] Wound Recurrence: 0.6x2x0.1 3.1x2.5x0.1 [name] Measurements L x W x D (cm) 0.942 6.087 [name] A (cm) : rea 0.094 0.609 [name] Volume (cm) : 66.70% 65.30% [name] % Reduction in A rea: -235.70% 82.70% [name] % Reduction in Volume: Full Thickness Without Exposed Full Thickness Without Exposed [name] Classification: Support Structures Support Structures Medium Large [name] Exudate  Amount: Sanguinous Serosanguineous [name] Exudate Type: red red, brown [name] Exudate Color: Yes No [name] Foul Odor A Cleansing: fter No [name] [name] Odor Anticipated Due to Product Use: [name] Thickened [name] Wound Margin: None Present (0%) Medium (34-66%) [name] Granulation A mount: [name] Red, Hyper-granulation, Friable [name] Granulation Quality: None Present (0%) Medium (34-66%) [name] Necrotic Amount: Fat Layer (Subcutaneous Tissue): Yes Fat Layer (Subcutaneous Tissue): Yes [name] Exposed Structures: Fascia: No Fascia: No Tendon: No Tendon: No Muscle: No Muscle: No Joint: No Joint: No Bone: No Bone: No None Medium (34-66%) [name] Epithelialization: Treatment Notes Electronic Signature(s) Signed: 10/12/2022 1:40:16 PM By: [name] MSN RN CNS WTA Entered By: [name] on 10/12/2022 08:47:48 Raap, Gwenlyn Perking [name] ([id number]) [id number] Page 6 of 12 -------------------------------------------------------------------------------- Multi-Disciplinary Care Plan Details Patient Name: Date of Service: [name] 10/12/2022 7:45 A M Medical Record Number: [id number] Patient Account Number: [id number] Date of Birth/Sex: Treating RN: August 04, 1946 (77 y.o. [name] Primary Care Jarred Purtee: [name] Other Clinician: Referring Elora Wolter: Treating Jolea Dolle/Extender: [name] in Treatment: 10 Active Inactive Necrotic Tissue Nursing Diagnoses: Impaired tissue integrity related to necrotic/devitalized tissue Knowledge deficit related to management of necrotic/devitalized tissue Goals: Necrotic/devitalized tissue will be minimized in the wound bed Date Initiated: 09/04/2022 Target Resolution Date: 09/27/2022 Goal Status: Active Patient/caregiver will verbalize understanding of reason and process for debridement of necrotic tissue Date Initiated: 09/04/2022 Date Inactivated: 09/10/2022 Target Resolution Date: 09/27/2022 Goal Status:  Met Interventions: Assess patient pain level pre-, during and post procedure and prior to discharge Provide education on necrotic tissue and debridement process Treatment Activities: Apply topical anesthetic as ordered : 09/04/2022 Excisional debridement : 09/04/2022 Notes: Venous Leg Ulcer Nursing Diagnoses: Actual venous Insuffiency (use after diagnosis is confirmed) Knowledge deficit related to disease process and management Potential for venous Insuffiency (use before diagnosis confirmed) Goals: Non-invasive venous studies are completed as ordered Date Initiated: 09/04/2022 Target Resolution Date: 09/03/2022 Goal Status: Active Patient will maintain optimal edema control Date Initiated: 09/04/2022 Date Inactivated: 09/10/2022 Target Resolution Date: 10/04/2022 Goal Status: Met Patient/caregiver will verbalize understanding of disease process and disease management Date Initiated: 09/04/2022 Date Inactivated: 09/10/2022 Target Resolution Date: 10/04/2022 Goal Status: Met Verify adequate tissue perfusion prior to therapeutic compression application Date Initiated: 09/04/2022 Date Inactivated: 09/10/2022 Target Resolution Date: 10/04/2022 Goal Status: Met Interventions: Assess peripheral edema status every visit. Compression as ordered Provide education on  venous insufficiency DAIEL, STROHECKER ([id number]) [id number] Page 7 of 12 Treatment Activities: Therapeutic compression applied : 09/04/2022 Notes: Wound/Skin Impairment Nursing Diagnoses: Impaired tissue integrity Knowledge deficit related to smoking impact on wound healing Knowledge deficit related to ulceration/compromised skin integrity Goals: Patient/caregiver will verbalize understanding of skin care regimen Date Initiated: 09/04/2022 Date Inactivated: 09/10/2022 Target Resolution Date: 10/04/2022 Goal Status: Met Ulcer/skin breakdown will have a volume reduction of 30% by week  4 Date Initiated: 09/04/2022 Date Inactivated: 09/10/2022 Target Resolution Date: 10/04/2022 Goal Status: Unmet Unmet Reason: comorbidities Ulcer/skin breakdown will have a volume reduction of 50% by week 8 Date Initiated: 09/04/2022 Target Resolution Date: 11/04/2022 Goal Status: Active Ulcer/skin breakdown will have a volume reduction of 80% by week 12 Date Initiated: 09/04/2022 Target Resolution Date: 12/05/2022 Goal Status: Active Ulcer/skin breakdown will heal within 14 weeks Date Initiated: 09/04/2022 Target Resolution Date: 12/12/2022 Goal Status: Active Interventions: Assess patient/caregiver ability to obtain necessary supplies Assess patient/caregiver ability to perform ulcer/skin care regimen upon admission and as needed Assess ulceration(s) every visit Provide education on ulcer and skin care Notes: Electronic Signature(s) Signed: 10/12/2022 12:10:51 PM By: [name] MSN RN CNS WTA Entered By: [name] on 10/12/2022 12:10:51 -------------------------------------------------------------------------------- Pain Assessment Details Patient Name: Date of Service: [name], [name] 10/12/2022 7:45 A M Medical Record Number: [id number] Patient Account Number: [id number] Date of Birth/Sex: Treating RN: 1945/11/07 (77 y.o. [name] Primary Care Daleiza Bacchi: [name] Other Clinician: Referring Marifer Hurd: Treating Mikal Wisman/Extender: [name] in Treatment: 10 Active Problems Location of Pain Severity and Description of Pain Patient Has Paino No Site Locations [place], [place] ([id number]) [phone].pdf Page 8 of 12 Pain Management and Medication Current Pain Management: Electronic Signature(s) Signed: 10/12/2022 1:40:16 PM By: [name] MSN RN CNS WTA Entered By: [name] on 10/12/2022 08:19:52 -------------------------------------------------------------------------------- Patient/Caregiver Education  Details Patient Name: Date of Service: [name] 1/5/2024andnbsp7:45 A M Medical Record Number: [id number] Patient Account Number: [id number] Date of Birth/Gender: Treating RN: [birth year] (77 y.o. [name] Primary Care Physician: [name] Other Clinician: Referring Physician: Treating Physician/Extender: [name] in Treatment: 10 Education Assessment Education Provided To: Patient Education Topics Provided Wound/Skin Impairment: Handouts: Caring for Your Ulcer Methods: Explain/Verbal Responses: State content correctly Electronic Signature(s) Signed: 10/12/2022 1:40:16 PM By: [name] MSN RN CNS WTA Entered By: [name] on 10/12/2022 12:10:45 ONDRE, SALVETTI [name] ([id number]) [id number] Page 9 of 12 -------------------------------------------------------------------------------- Wound Assessment Details Patient Name: Date of Service: [name] 10/12/2022 7:45 A M Medical Record Number: [id number] Patient Account Number: [id number] Date of Birth/Sex: Treating RN: August 18, 1946 (77 y.o. [name] Primary Care Kiandre Spagnolo: [name] Other Clinician: Referring Yanis Juma: Treating Shontez Sermon/Extender: [name] in Treatment: 10 Wound Status Wound Number: 15 Primary Etiology: Venous Leg Ulcer Wound Location: Left, Posterior Lower Leg Wound Status: Open Wounding Event: Gradually Appeared Comorbid History: Arrhythmia, Hypertension, Gout Date Acquired: 08/28/2022 Weeks Of Treatment: 5 Clustered Wound: No Photos Wound Measurements Length: (cm) 0.6 Width: (cm) 2 Depth: (cm) 0.1 Area: (cm) 0.942 Volume: (cm) 0.094 % Reduction in Area: 66.7% % Reduction in Volume: -235.7% Epithelialization: None Wound Description Classification: Full Thickness Without Exposed Support Exudate Amount: Medium Exudate Type: Sanguinous Exudate Color: red Structures Foul Odor After  Cleansing: Yes Due to Product Use: No Slough/Fibrino No Wound Bed Granulation Amount: None Present (0%) Exposed Structure Necrotic Amount: None Present (0%) Fascia Exposed: No Fat Layer (Subcutaneous  Tissue) Exposed: Yes Tendon Exposed: No Muscle Exposed: No Joint Exposed: No Bone Exposed: No Treatment Notes Wound #15 (Lower Leg) Wound Laterality: Left, Posterior Cleanser Peri-Wound Care Topical REINO, LYBBERT [name] ([id number]) [id number] Page 10 of 12 Primary Dressing Silvercel Small 2x2 (in/in) Discharge Instruction: Apply Silvercel Small 2x2 (in/in) as instructed Secondary Dressing Coverlet Latex-Free Fabric Adhesive Dressings Discharge Instruction: 1.5 x 2 Secured With Compression Wrap Compression Stockings Add-Ons Electronic Signature(s) Signed: 10/12/2022 1:40:16 PM By: [name] MSN RN CNS WTA Entered By: [name] on 10/12/2022 08:21:37 -------------------------------------------------------------------------------- Wound Assessment Details Patient Name: Date of Service: [name], [name] 10/12/2022 7:45 A M Medical Record Number: [id number] Patient Account Number: [id number] Date of Birth/Sex: Treating RN: 1945/12/28 (77 y.o. [name] Primary Care Candler Ginsberg: [name] Other Clinician: Referring Derel Mcglasson: Treating Giovonnie Trettel/Extender: [name] in Treatment: 10 Wound Status Wound Number: 9 Primary Etiology: Venous Leg Ulcer Wound Location: Left, Lateral Lower Leg Wound Status: Open Wounding Event: Gradually Appeared Comorbid History: Arrhythmia, Hypertension, Gout Date Acquired: 01/20/2022 Weeks Of Treatment: 10 Clustered Wound: No Photos Wound Measurements Length: (cm) 3.1 Width: (cm) 2.5 Depth: (cm) 0.1 Area: (cm) 6.087 Volume: (cm) 0.609 % Reduction in Area: 65.3% % Reduction in Volume: 82.7% Epithelialization: Medium (34-66%) Wound Description Classification: Full Thickness Without  Exposed Support Structures Wound Margin: Thickened STEPEHN, ECKARD [name] Exudate Amount: Large Exudate Type: Serosanguineous Exudate Color: red, brown Foul Odor After Cleansing: No Slough/Fibrino Yes [id number] Page 11 of 12 Wound Bed Granulation Amount: Medium (34-66%) Exposed Structure Granulation Quality: Red, Hyper-granulation, Friable Fascia Exposed: No Necrotic Amount: Medium (34-66%) Fat Layer (Subcutaneous Tissue) Exposed: Yes Necrotic Quality: Adherent Slough Tendon Exposed: No Muscle Exposed: No Joint Exposed: No Bone Exposed: No Treatment Notes Wound #9 (Lower Leg) Wound Laterality: Left, Lateral Cleanser Peri-Wound Care Topical Primary Dressing Silvercel Small 2x2 (in/in) Discharge Instruction: Apply Silvercel Small 2x2 (in/in) as instructed Secondary Dressing Zetuvit Plus 4x8 (in/in) Secured With [employer] Sterile or Non-Sterile 6-ply 4.5x4 (yd/yd) Discharge Instruction: Apply Kerlix as directed Compression Wrap 3-LAYER WRAP - Profore Lite LF 3 Multilayer Compression Bandaging System Discharge Instruction: Apply 3 multi-layer wrap as prescribed. Compression Stockings Add-Ons Electronic Signature(s) Signed: 10/12/2022 1:40:16 PM By: [name] MSN RN CNS WTA Entered By: [name] on 10/12/2022 07:59:51 -------------------------------------------------------------------------------- Vitals Details Patient Name: Date of Service: [name], [name] 10/12/2022 7:45 A M Medical Record Number: [id number] Patient Account Number: [id number] Date of Birth/Sex: Treating RN: [birth year] (77 y.o. [name] Primary Care Massey Ruhland: [name] Other Clinician: Referring Eavan Gonterman: Treating [employer]/Extender: [name] in Treatment: 10 Vital Signs Time Taken: 07:44 Temperature (F): 98.0 Height (in): 75 Pulse (bpm): 47 Weight (lbs): 225 Respiratory Rate (breaths/min): 16 Body Mass Index (BMI):  28.1 Blood Pressure (mmHg): 121/46 Reference Range: 80 - 120 mg / dl JON, LALL [name] ([id number]) [id number] Page 12 of 12 Electronic Signature(s) Signed: 10/12/2022 1:40:16 PM By: [name] MSN RN CNS WTA Entered By: [name] on 10/12/2022 08:19:47

## 2022-10-16 ENCOUNTER — Other Ambulatory Visit: Payer: Medicare PPO

## 2022-10-16 ENCOUNTER — Ambulatory Visit: Payer: Medicare PPO | Admitting: Physician Assistant

## 2022-10-16 DIAGNOSIS — N529 Male erectile dysfunction, unspecified: Secondary | ICD-10-CM

## 2022-10-16 DIAGNOSIS — N138 Other obstructive and reflux uropathy: Secondary | ICD-10-CM

## 2022-10-16 DIAGNOSIS — N401 Enlarged prostate with lower urinary tract symptoms: Secondary | ICD-10-CM | POA: Diagnosis not present

## 2022-10-17 LAB — PSA: Prostate Specific Ag, Serum: 0.4 ng/mL (ref 0.0–4.0)

## 2022-10-18 DIAGNOSIS — C44729 Squamous cell carcinoma of skin of left lower limb, including hip: Secondary | ICD-10-CM | POA: Diagnosis not present

## 2022-10-18 NOTE — Progress Notes (Signed)
10/19/2022  11:14 AM   [name] 03-Sep-1946 [id number]  Referring provider: Birdie Sons, [place],  [place] [address]  Urological history 1. ED -Contributing factors of age, BPH, hypertension, CHF, testosterone deficiency, Peyronie's disease and prediabetes -IPP 2015  2. BPH with LU TS -PSA (10/2022) 0.4 -I PSS 7/2   HPI: [name]  is a 77 y.o. male presenting for his annual exam.   I PSS 7/2   He has no urinary complaints at this time.  Patient denies any modifying or aggravating factors.  Patient denies any gross hematuria, dysuria or suprapubic/flank pain.  Patient denies any fevers, chills, nausea or vomiting.    He is not as active sexually active as he would like to be, but his prothesis still works.     IPSS     Row Name 10/19/22 1100         International Prostate Symptom Score   How often have you had the sensation of not emptying your bladder? Less than 1 in 5     How often have you had to urinate less than every two hours? Less than 1 in 5 times     How often have you found you stopped and started again several times when you urinated? Less than 1 in 5 times     How often have you found it difficult to postpone urination? Less than 1 in 5 times     How often have you had a weak urinary stream? Less than 1 in 5 times     How often have you had to strain to start urination? Less than 1 in 5 times     How many times did you typically get up at night to urinate? 1 Time     Total IPSS Score 7       Quality of Life due to urinary symptoms   If you were to spend the rest of your life with your urinary condition just the way it is now how would you feel about that? Mostly Satisfied                Score:  1-7 Mild 8-19 Moderate 20-35 Severe  PMH: Past Medical History:  Diagnosis Date   Basal cell carcinoma of right lower extremity 03/24/2015   BPH with obstruction/lower urinary tract symptoms    Chronic  atrial fibrillation (HCC)    Chronic heart failure with preserved ejection fraction (HFpEF) (HCC)    Dislocation of shoulder, anterior, right, closed 07/24/2014   Echocardiogram abnormal    2009 moderate to severely dilated left atrium   Erectile dysfunction    Essential hypertension    Gout    History of chicken pox    History of measles    Hyperglycemia    Hypogonadism in male    Nocturia    Peyronie's disease    Traumatic tear of right rotator cuff 07/24/2014   Venous stasis    chronic   Surgical History: Past Surgical History:  Procedure Laterality Date   CARDIAC CATHETERIZATION  03/28/2004   normal coronaries, reduced EF at 25-30% (Dr. Gerrie Nordmann)   CARDIOVERSION, TEE guided  03/29/2004   Dr. Gerrie Nordmann    COLONOSCOPY  05/28/2013   Dr. Rayann Heman   HERNIA REPAIR  age 38   KNEE SURGERY     LUMBAR FUSION  age 86   MOHS SURGERY Right 08/20/2019   ALA done by Ishmael Holter, MD [facility]  MYOVIEW CARDIOVASCULAR STRESS TEST  07/2003   anterior wall thinning, LV systolic function depressed at 33%   PILONIDAL CYST EXCISION     Right Eyebrow Tumor Removed  age 56    SHOULDER ARTHROSCOPY WITH ROTATOR CUFF REPAIR AND SUBACROMIAL DECOMPRESSION Right 08/19/2014   Procedure: SHOULDER ARTHROSCOPY WITH ROTATOR CUFF REPAIR AND SUBACROMIAL DECOMPRESSION;  Surgeon: Lorn Junes, MD;  Location: [facility];  Service: Orthopedics;  Laterality: Right;   TOTAL HIP ARTHROPLASTY Right 03/25/2006   Dr. Wanda Plump. [place]   TRANSTHORACIC ECHOCARDIOGRAM  09/11/2022   LV EF 50 to 55%.  Normal function with no RWMA.  Unable to assess DD - Afib.  Mildly reduced RV function moderately enlarged RV and moderately elevated PAP.  Severe biatrial enlargement.  Mild MR.  Normal RAP.:   Home Medications:  Allergies as of 10/19/2022       Reactions   Benazepril    COUGH         Medication List        Accurate as of October 19, 2022 11:14 AM. If you have any questions, ask your nurse or doctor.           carvedilol 12.5 MG tablet Commonly known as: COREG TAKE 1 TABLET BY MOUTH TWICE A DAY   chlorthalidone 25 MG tablet Commonly known as: HYGROTON Take 1 tablet (25 mg total) by mouth daily.   furosemide 20 MG tablet Commonly known as: LASIX Take 1 tablet (20 mg total) by mouth daily.   levothyroxine 50 MCG tablet Commonly known as: SYNTHROID Take 1 tablet (50 mcg total) by mouth daily.   multivitamin tablet Take 1 tablet by mouth daily.   potassium chloride SA 20 MEQ tablet Commonly known as: KLOR-CON M Take 2 tablets (40 mEq total) by mouth daily.   rivaroxaban 20 MG Tabs tablet Commonly known as: Xarelto Take 1 tablet (20 mg total) by mouth daily with supper.   rosuvastatin 10 MG tablet Commonly known as: CRESTOR Take 1 tablet (10 mg total) by mouth daily.   valsartan 160 MG tablet Commonly known as: DIOVAN Take 1 tablet (160 mg total) by mouth daily.       Allergies:  Allergies  Allergen Reactions   Benazepril     COUGH     Family History: Family History  Problem Relation Age of Onset   Hypertension Father    Prostate cancer Father    Bladder Cancer Father    Hypertension Mother    Stroke Maternal Grandfather    Diabetes Paternal Grandfather    Supraventricular tachycardia Brother    Arthritis Sister        RA   Diabetes Maternal Uncle        TYPE 2   Supraventricular tachycardia Child    Kidney disease Neg Hx    Heart attack Neg Hx    Colon cancer Neg Hx    Social History:  reports that he has never smoked. He has never used smokeless tobacco. He reports current alcohol use of about 4.0 standard drinks of alcohol per week. He reports that he does not use drugs.  Pertinent ROS in HPI  Physical Exam: BP (!) 140/82   Pulse 74   Ht '6\' 3"'$  (1.905 m)   Wt 220 lb (99.8 kg)   BMI 27.50 kg/m   Constitutional:  Well nourished. Alert and oriented, No acute distress. HEENT: [place] AT, moist mucus membranes.  Trachea midline Cardiovascular: No  clubbing, cyanosis, or edema. Respiratory: Normal  respiratory effort, no increased work of breathing. Neurologic: Grossly intact, no focal deficits, moving all 4 extremities. Psychiatric: Normal mood and affect.   Laboratory Data: Serum creatinine (09/2022) 0.93 Hemoglobin A1c (09/2022) 6.2 Total cholesterol (09/2022) 187 I have reviewed the labs.  Assessment & Plan:    1. BPH with LUTS -PSA stable -continue conservative management, avoiding bladder irritants and timed voiding's -per AUA guidelines and SDM, we have decided to discontinue prostate cancer screening at this time  2. ED -IPP in place  -Functioning properly  Return in about 1 year (around 10/20/2023) for I PSS .  [name]   [place] [address] [place], [place] [address] [phone]

## 2022-10-19 ENCOUNTER — Ambulatory Visit: Payer: Medicare PPO | Admitting: Urology

## 2022-10-19 ENCOUNTER — Encounter: Payer: Self-pay | Admitting: Urology

## 2022-10-19 VITALS — BP 140/82 | HR 74 | Ht 75.0 in | Wt 220.0 lb

## 2022-10-19 DIAGNOSIS — L97822 Non-pressure chronic ulcer of other part of left lower leg with fat layer exposed: Secondary | ICD-10-CM | POA: Diagnosis not present

## 2022-10-19 DIAGNOSIS — N401 Enlarged prostate with lower urinary tract symptoms: Secondary | ICD-10-CM | POA: Diagnosis not present

## 2022-10-19 DIAGNOSIS — N529 Male erectile dysfunction, unspecified: Secondary | ICD-10-CM | POA: Diagnosis not present

## 2022-10-19 DIAGNOSIS — N138 Other obstructive and reflux uropathy: Secondary | ICD-10-CM

## 2022-10-19 DIAGNOSIS — I10 Essential (primary) hypertension: Secondary | ICD-10-CM | POA: Diagnosis not present

## 2022-10-19 DIAGNOSIS — I87333 Chronic venous hypertension (idiopathic) with ulcer and inflammation of bilateral lower extremity: Secondary | ICD-10-CM | POA: Diagnosis not present

## 2022-10-19 DIAGNOSIS — I7389 Other specified peripheral vascular diseases: Secondary | ICD-10-CM | POA: Diagnosis not present

## 2022-10-19 DIAGNOSIS — I480 Paroxysmal atrial fibrillation: Secondary | ICD-10-CM | POA: Diagnosis not present

## 2022-10-19 DIAGNOSIS — Z85828 Personal history of other malignant neoplasm of skin: Secondary | ICD-10-CM | POA: Diagnosis not present

## 2022-10-19 DIAGNOSIS — L97812 Non-pressure chronic ulcer of other part of right lower leg with fat layer exposed: Secondary | ICD-10-CM | POA: Diagnosis not present

## 2022-10-19 DIAGNOSIS — I428 Other cardiomyopathies: Secondary | ICD-10-CM | POA: Diagnosis not present

## 2022-10-19 DIAGNOSIS — Z7901 Long term (current) use of anticoagulants: Secondary | ICD-10-CM | POA: Diagnosis not present

## 2022-10-19 NOTE — Progress Notes (Signed)
[name], [name] ([id number]) [id number] Page 1 of 2 Visit Report for 10/19/2022 Physician Orders Details Patient Name: Date of Service: [name] 10/19/2022 7:30 A M Medical Record Number: [id number] Patient Account Number: [id number] Date of Birth/Sex: Treating RN: Jan 07, 1946 (76 y.o. [name] Primary Care Provider: Lelon [name] Other Clinician: Referring Provider: Treating Provider/Extender: [name] in Treatment: [phone] Verbal / Phone Orders: No Diagnosis Coding Follow-up Appointments Return Appointment in 1 week. Bathing/ Shower/ Hygiene May shower with wound dressing protected with water repellent cover or cast protector. No tub bath. Anesthetic (Use 'Patient Medications' Section for Anesthetic Order Entry) Wound #9 Left,Lateral Lower Leg Lidocaine applied to wound bed Edema Control - Lymphedema / Segmental Compressive Device / Other Bilateral Lower Extremities 3 Layer Compression System for Lymphedema. - left lower leg Patient to wear own compression stockings. Remove compression stockings every night before going to bed and put on every morning when getting up. - right lower leg Elevate, Exercise Daily and A void Standing for Long Periods of Time. Elevate legs to the level of the heart and pump ankles as often as possible Elevate leg(s) parallel to the floor when sitting. DO YOUR BEST to sleep in the bed at night. DO NOT sleep in your recliner. Long hours of sitting in a recliner leads to swelling of the legs and/or potential wounds on your backside. Additional Orders / Instructions Follow Nutritious Diet and Increase Protein Intake Wound Treatment Wound #15 - Lower Leg Wound Laterality: Left, Posterior Prim Dressing: Silvercel Small 2x2 (in/in) 1 x Per Day/30 Days ary Discharge Instructions: Apply Silvercel Small 2x2 (in/in) as instructed Secondary Dressing: ABD Pad 5x9 (in/in) 1 x Per Day/30  Days Discharge Instructions: Cover with ABD pad Wound #9 - Lower Leg Wound Laterality: Left, Lateral Prim Dressing: Silvercel Small 2x2 (in/in) 1 x Per Week/30 Days ary Discharge Instructions: Apply Silvercel Small 2x2 (in/in) as instructed Secondary Dressing: ABD Pad 5x9 (in/in) 1 x Per Week/30 Days Discharge Instructions: Cover with ABD pad Compression Wrap: 3-LAYER WRAP - Profore Lite LF 3 Multilayer Compression Bandaging System 1 x Per Week/30 Days Discharge Instructions: Apply 3 multi-layer wrap as prescribed. Electronic Signature(s) Signed: 10/19/2022 11:19:07 AM By: [name], BSN, RN, CWS, Kim RN, BSN Signed: 10/19/2022 1:28:04 PM By: [name], [place] ([id number]) [id number] Page 2 of 2 Entered By: [name], BSN, RN, CWS, [name] on 10/19/2022 08:01:45 -------------------------------------------------------------------------------- SuperBill Details Patient Name: Date of Service: [name] 10/19/2022 Medical Record Number: [id number] Patient Account Number: [id number] Date of Birth/Sex: Treating RN: Mar 04, 1946 (77 y.o. [name], [name] Primary Care Provider: Lelon [name] Other Clinician: Referring Provider: Treating Provider/Extender: [name] in Treatment: 11 Diagnosis Coding ICD-10 Codes Code Description [phone] Chronic venous hypertension (idiopathic) with ulcer and inflammation of bilateral lower extremity L97.822 Non-pressure chronic ulcer of other part of left lower leg with fat layer exposed L97.812 Non-pressure chronic ulcer of other part of right lower leg with fat layer exposed I73.89 Other specified peripheral vascular diseases I48.0 Paroxysmal atrial fibrillation I10 Essential (primary) hypertension Facility Procedures : CPT4 Code: 32440102 Description: (Facility Use Only) [facility] LT LEG Modifier: Quantity: 1 Electronic Signature(s) Signed: 10/19/2022 11:19:07 AM  By: [name], BSN, RN, CWS, Kim RN, BSN Signed: 10/19/2022 1:28:04 PM By: [name] Keeler PA-C Entered By: [name], BSN, RN, CWS, [name] on 10/19/2022 08:02:27

## 2022-10-19 NOTE — Progress Notes (Signed)
IZEAR, PINE [name] ([id number]) [id number] Page 1 of 5 Visit Report for 10/19/2022 Arrival Information Details Patient Name: Date of Service: [name] 10/19/2022 7:30 [name] Medical Record Number: [id number] Patient Account Number: [id number] Date of Birth/Sex: Treating RN: 1946-03-03 (77 y.o. [name] Primary Care Cambri Plourde: [name] Other Clinician: Referring Leoncio Hansen: Treating Haely Leyland/Extender: [name] in Treatment: 11 Visit Information History Since Last Visit Added or deleted any medications: No Patient Arrived: Ambulatory Has Dressing in Place as Prescribed: Yes Arrival Time: 07:57 Has Compression in Place as Prescribed: Yes Transfer Assistance: None Pain Present Now: No Patient Identification Verified: Yes Secondary Verification Process Completed: Yes Patient Requires Transmission-Based No Precautions: Patient Has Alerts: Yes Patient Alerts: Patient on Blood Thinner 12/13/21 TBI [name]; NOT diabetic HISTORY OF SKIN CANCERS Electronic Signature(s) Signed: 10/19/2022 11:19:07 AM By: Gretta Cool, BSN, RN, CWS, Kim RN, BSN Entered By: Gretta Cool, BSN, RN, CWS, Kim on 10/19/2022 07:57:45 -------------------------------------------------------------------------------- Compression Therapy Details Patient Name: Date of Service: [name], [name] 10/19/2022 7:30 [name] Medical Record Number: [id number] Patient Account Number: [id number] Date of Birth/Sex: Treating RN: [birth year] (77 y.o. [name] Primary Care Shequilla Goodgame: [name] Other Clinician: Referring Corinna Burkman: Treating Anelle Parlow/Extender: [name] in Treatment: 11 Compression Therapy Performed for Wound Assessment: Wound #15 Left,Posterior Lower Leg Performed By: Clinician Cornell Barman, RN Compression Type: Three Layer Pre Treatment ABI: 0.7 Electronic Signature(s) Signed: 10/19/2022 11:19:07 AM By: Gretta Cool, BSN, RN, CWS,  Kim RN, BSN Entered By: Gretta Cool, BSN, RN, CWS, Kim on 10/19/2022 07:59:54 [name], [name] ([id number]) [id number] Page 2 of 5 -------------------------------------------------------------------------------- Compression Therapy Details Patient Name: Date of Service: [name] [name] [name] 10/19/2022 7:30 [name] Medical Record Number: [id number] Patient Account Number: [id number] Date of Birth/Sex: Treating RN: 1946/09/26 (77 y.o. [name] Primary Care Helaine Yackel: [name] Other Clinician: Referring Georges Victorio: Treating Trayton Szabo/Extender: [name] in Treatment: 11 Compression Therapy Performed for Wound Assessment: Wound #9 Left,Lateral Lower Leg Performed By: Clinician Cornell Barman, RN Compression Type: Three Layer Pre Treatment ABI: 0.7 Electronic Signature(s) Signed: 10/19/2022 11:19:07 AM By: Gretta Cool, BSN, RN, CWS, Kim RN, BSN Entered By: Gretta Cool, BSN, RN, CWS, Kim on 10/19/2022 07:59:54 -------------------------------------------------------------------------------- Encounter Discharge Information Details Patient Name: Date of Service: [name], [name] 10/19/2022 7:30 [name] Medical Record Number: [id number] Patient Account Number: [id number] Date of Birth/Sex: Treating RN: 12-03-1945 (77 y.o. [name] Primary Care Zanden Colver: [name] Other Clinician: Referring Aamori Mcmasters: Treating Laxmi Choung/Extender: [name] in Treatment: 11 Encounter Discharge Information Items Discharge Condition: Stable Ambulatory Status: Ambulatory Discharge Destination: Home Transportation: Private Auto Schedule Follow-up Appointment: Yes Clinical Summary of Care: Electronic Signature(s) Signed: 10/19/2022 11:19:07 AM By: Gretta Cool, BSN, RN, CWS, Kim RN, BSN Entered By: Gretta Cool, BSN, RN, CWS, Kim on 10/19/2022 08:02:13 [name] ([id number]) [id number] Page 3 of  5 -------------------------------------------------------------------------------- Wound Assessment Details Patient Name: Date of Service: [name] [name] [name] 10/19/2022 7:30 [name] Medical Record Number: [id number] Patient Account Number: [id number] Date of Birth/Sex: Treating RN: 06-07-1946 (77 y.o. [name] Primary Care Shamina Etheridge: [name] Other Clinician: Referring Lashawnda Hancox: Treating Robertson Colclough/Extender: [name] in Treatment: 11 Wound Status Wound Number: 15 Primary Etiology: Venous Leg Ulcer Wound Location: Left, Posterior Lower Leg Wound Status: Open Wounding Event: Gradually Appeared Date Acquired: 08/28/2022 Weeks Of Treatment: 6 Clustered Wound: No Photos Photo Uploaded By: Gretta Cool,  BSN, RN, CWS, Kim on 10/19/2022 08:04:06 Wound Measurements Length: (cm) 2 Width: (cm) 2 Depth: (cm) 1.5 Area: (cm) 3.142 Volume: (cm) 4.712 % Reduction in Area: -11.1% % Reduction in Volume: -16728.6% Wound Description Classification: Full Thickness Without Exposed Support Exudate Amount: Medium Exudate Type: Sanguinous Exudate Color: red Structures Treatment Notes Wound #15 (Lower Leg) Wound Laterality: Left, Posterior Cleanser Peri-Wound Care Topical Primary Dressing Silvercel Small 2x2 (in/in) Discharge Instruction: ZCHYI FOYDXAJOI Small 2x2 (in/in) as instructed Secondary Dressing ABD Pad 5x9 (in/in) Discharge Instruction: Cover with ABD pad [name] ([id number]) [phone].pdf Page 4 of 5 Secured With Compression Wrap Compression Stockings Environmental education officer) Signed: 10/19/2022 11:19:07 AM By: Gretta Cool, BSN, RN, CWS, Kim RN, BSN Entered By: Gretta Cool, BSN, RN, CWS, Kim on 10/19/2022 07:59:27 -------------------------------------------------------------------------------- Wound Assessment Details Patient Name: Date of Service: [name], [name] 10/19/2022 7:30 [name] Medical Record Number:  [id number] Patient Account Number: [id number] Date of Birth/Sex: Treating RN: 06-04-1946 (77 y.o. [name] Primary Care Janyce Ellinger: [name] Other Clinician: Referring Shweta Aman: Treating Tambi Thole/Extender: [name] in Treatment: 11 Wound Status Wound Number: 9 Primary Etiology: Venous Leg Ulcer Wound Location: Left, Lateral Lower Leg Wound Status: Open Wounding Event: Gradually Appeared Date Acquired: 01/20/2022 Weeks Of Treatment: 11 Clustered Wound: No Photos Photo Uploaded By: Gretta Cool, BSN, RN, CWS, Kim on 10/19/2022 08:04:06 Wound Measurements Length: (cm) 3.1 Width: (cm) 2.5 Depth: (cm) 0.1 Area: (cm) 6.087 Volume: (cm) 0.609 % Reduction in Area: 65.3% % Reduction in Volume: 82.7% Wound Description Classification: Full Thickness Without Exposed Support Exudate Amount: Large Exudate Type: Serosanguineous Exudate Color: red, brown Structures Treatment Notes Wound #9 (Lower Leg) Wound Laterality: Left, Lateral KALIM, KISSEL [name] ([id number]) [phone].pdf Page 5 of 5 Cleanser Peri-Wound Care Topical Primary Dressing Silvercel Small 2x2 (in/in) Discharge Instruction: Apply Silvercel Small 2x2 (in/in) as instructed Secondary Dressing ABD Pad 5x9 (in/in) Discharge Instruction: Cover with ABD pad Secured With Compression Wrap 3-LAYER WRAP - Profore Lite LF 3 Multilayer Compression Bandaging System Discharge Instruction: Apply 3 multi-layer wrap as prescribed. Compression Stockings Add-Ons Electronic Signature(s) Signed: 10/19/2022 11:19:07 AM By: Gretta Cool, BSN, RN, CWS, Kim RN, BSN Entered By: Gretta Cool, BSN, RN, CWS, Kim on 10/19/2022 07:59:27

## 2022-10-23 ENCOUNTER — Encounter: Payer: Medicare PPO | Admitting: Physician Assistant

## 2022-10-23 DIAGNOSIS — I10 Essential (primary) hypertension: Secondary | ICD-10-CM | POA: Diagnosis not present

## 2022-10-23 DIAGNOSIS — I480 Paroxysmal atrial fibrillation: Secondary | ICD-10-CM | POA: Diagnosis not present

## 2022-10-23 DIAGNOSIS — I428 Other cardiomyopathies: Secondary | ICD-10-CM | POA: Diagnosis not present

## 2022-10-23 DIAGNOSIS — Z85828 Personal history of other malignant neoplasm of skin: Secondary | ICD-10-CM | POA: Diagnosis not present

## 2022-10-23 DIAGNOSIS — I7389 Other specified peripheral vascular diseases: Secondary | ICD-10-CM | POA: Diagnosis not present

## 2022-10-23 DIAGNOSIS — L97228 Non-pressure chronic ulcer of left calf with other specified severity: Secondary | ICD-10-CM | POA: Diagnosis not present

## 2022-10-23 DIAGNOSIS — T8189XA Other complications of procedures, not elsewhere classified, initial encounter: Secondary | ICD-10-CM | POA: Diagnosis not present

## 2022-10-23 DIAGNOSIS — L97812 Non-pressure chronic ulcer of other part of right lower leg with fat layer exposed: Secondary | ICD-10-CM | POA: Diagnosis not present

## 2022-10-23 DIAGNOSIS — L97822 Non-pressure chronic ulcer of other part of left lower leg with fat layer exposed: Secondary | ICD-10-CM | POA: Diagnosis not present

## 2022-10-23 DIAGNOSIS — Z7901 Long term (current) use of anticoagulants: Secondary | ICD-10-CM | POA: Diagnosis not present

## 2022-10-23 DIAGNOSIS — I87333 Chronic venous hypertension (idiopathic) with ulcer and inflammation of bilateral lower extremity: Secondary | ICD-10-CM | POA: Diagnosis not present

## 2022-10-23 NOTE — Progress Notes (Addendum)
[name], DAYWALT [name] ([id number]) [id number] Page 1 of 10 Visit Report for 10/23/2022 Chief Complaint Document Details Patient Name: Date of Service: [name] 10/23/2022 4:00 PM Medical Record Number: [id number] Patient Account Number: [id number] Date of Birth/Sex: Treating RN: [birth year] (77 [name].o. [name] Primary Care Provider: [name] Other Clinician: Referring Provider: Treating Provider/Extender: [name] in Treatment: 11 Information Obtained from: Patient Chief Complaint Bilateral LE Ulcers Electronic Signature(s) Signed: 10/23/2022 4:06:32 PM By: [name] PA-C Entered By: [name] on 10/23/2022 16:06:32 -------------------------------------------------------------------------------- HPI Details Patient Name: Date of Service: MO [place], [name] 10/23/2022 4:00 PM Medical Record Number: [id number] Patient Account Number: [id number] Date of Birth/Sex: Treating RN: 1945-10-16 (72 [name].o. [name], [name] Primary Care Provider: [name] Other Clinician: Referring Provider: Treating Provider/Extender: [name] in Treatment: 11 History of Present Illness HPI Description: 77 year old male who has a past medical history of essential hypertension, chronic atrial fibrillation, peripheral vascular disease, nonischemic cardiomyopathy,venous stasis dermatitis, gouty arthropathy, basal cell carcinoma of the right lower extremity, benign prostatic hypertrophy, long- term use of anticoagulation therapy, hyperglycemia and exercise intolerance has never been a smoker. the patient has had a vascular workup over 7 years ago and said everything was normal at that stage. He does not have any chronic problems except for cardiac issues which he sees a cardiologist in [place]. 08/15/2017 -- arterial and venous duplex studies still pending. 08/23/2017 -- venous reflux studies done on 08/13/2017  shows venous incompetence throughout the left lower extremity deep system and focally at the left saphenofemoral junction. 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 No evidence of SVT or DVT in bilateral lower extremities The patient has an appointment at the end of the month to get his arterial duplex study done 09/05/2017 -- the patient was seen at the vein and vascular office yesterday by Angelena Form. ABI studies were notable for medial calcification and the toe brachial indices were normal and bilateral ankle-brachial) waveforms were normal with triphasic flow. After review of his venous studies he was not a candidate for laser ablation and his lymphedema was to be treated with compression stockings and lymphedema pump pumps 09/12/2017 -- had a low arterial study done at the [facility] -- unable to obtain reliable ABI is due to medial calcification. Bilateral toe KHIRY, [name] ([id number]) [id number] Page 2 of 10 indices were normal with the right being 1.01 and the left being 0.92 and the waveforms were triphasic bilaterally. he did get hold of 30-40 mm compression stockings but is unable to put these on. We will try and get him alternative compression stockings. 09/26/17- he is here in follow up evaluation of a right lower extremity ulcer;he is compliant in wearing compression stocking; ulcer almost epithelialized , anticipate healing next appointment Readmission: 11/17 point upon evaluation patient's wound currently that he is seeing [place] for today is a skin cancerous lesion that was cleared away by his dermatologist on the left medial calf region. He tells me that this is a very similar thing to what he had done previously in fact the last time he saw him in 2018 this was also what was going on at that point. Nonetheless he feels that based on what he seeing currently that this is just having a lot of harder time  healing although it is much closer to the surface than what he is experienced in the past.  He notes that the initial removal was in June 2022 which was this year this is now November and still has not closed. He does have some edema and definitely I think that there is some venous component to his slow healing here. Also think that we can do something better than Vaseline to try to help with getting this to clear up as quickly as possible. He does have a history of atrial fibrillation and is on Eliquis otherwise he really has no major medical problems that would affect wound healing. 09/07/2021 upon evaluation today patient actually appears to be doing significantly better after having wrapped him last week. Overall I think that this is making significant improvements at this time which is great news. I do not see any evidence of infection which is great news as well. No fevers, chills, nausea, vomiting, or diarrhea. 09/14/2021 upon evaluation today patient appears to be doing well with regard to his leg ulcer. He has been tolerating the dressing changes and overall I think that he is making excellent progress. I do not see any signs of active infection at this time. 09/21/2021 upon evaluation today patient actually appears to be making good progress with regard to his wound this is again measuring smaller today no debridement seems to be necessary. We have been using a silver collagen dressing and I think that is doing an awesome job. 09/28/2021 upon evaluation today patient appears to be doing well with regard to his leg currently. I do not see any signs of active infection at this time which is great news. No fevers, chills, nausea, vomiting, or diarrhea. I think this wound is very close to complete resolution. 10/12/2021 upon evaluation today patient actually appears to be doing awesome in regard to his leg ulcer. In fact this appears to be completely healed based on what I am seeing currently. I do  not see any evidence of active infection locally nor systemically at this time which is also great news. No fevers, chills, nausea, vomiting, or diarrhea. Readmission: 12/07/2021 upon evaluation today patient presents for readmission here in the clinic. He was discharged on 10/12/2021 is completely healed. Unfortunately this has reopened at this point and he is having continual issues with new blisters over both lower extremities. This is even worse than what we previously saw. Nonetheless we did actually check his ABIs today and it did reveal that his ABIs were 0.55 on the left and 0.57 on the right. Subsequently this is a definite change from his last arterial study which showed that he did have good blood flow at 1.01 on the right and 0.92 on the left and that was right at the beginning of 2019. Nonetheless based on what we see currently I do think he tolerated the 3 layer compression wrap but I do believe that we probably need to get him tested for his arterial flow in order to see where things stand and if there is something we can do there that would help prevent this from continue to be an ongoing issue. He did not utilize compression socks in the interim from when he was last here till this time. That something is probably going to need lifelong going forward as well. 3/9; patient presents for follow-up. He has no issues or complaints today. He tolerated the compression wrap well. He had ABIs with TBI's done. He denies signs of infection. 12/21/2021 upon evaluation today patient appears to be doing well with regard to the wounds on his legs. Both  are showing signs of significant improvement which is great news although I do believe some sharp debridement would be of benefit here as well. 12/28/2021 upon evaluation today patient appears to be doing well with regard to his wounds. Everything is showing signs of excellent improvement which I am very pleased about. I think that we are headed in the  right direction here. Fortunately there does not appear to be any evidence of infection which is great news there is a little bit of hypergranulation. 01/04/2022 upon evaluation today patient appears to be doing well with regard to his wounds 2 of them are healed 1 is almost so and the other 1 is significantly better. Overall I am extremely pleased with where we stand and I think that he is making excellent progress here. I do not see any evidence of active infection locally nor systemically at this time. 01-16-2022 upon evaluation today patient's wound on the left leg is showing signs of doing quite well. Has not completely cleared at this point but it is much improved. Fortunately I do not see any signs of infection at this time. No fevers, chills, nausea, vomiting, or diarrhea. 01-23-2022 upon evaluation today patient's wound of the left leg actually appears to be pretty much completely healed which is great news. I do not see any signs of active infection locally or systemically which is excellent. With that being said on the right leg what wound is measuring smaller the other 1 is a new wound that just showed up fortunately its not too bad. Has been using Xeroform here and that seems to be doing decently well which is great news. Unfortunately his blood pressure is significantly high we gave him the readings for the past 4-5 visits as well as a recommendation to make an appointment to go discuss this with his primary care provider patient states that he is going to look into doing this. 01-30-2022 upon evaluation today patient appears to be doing well with regard to his left leg everything appears to be healed. On the right leg the more anterior wound is healed the more medial wound that I been concerned about a possible skin cancer unfortunately still does not look great to me. I do believe that we should probably do a biopsy I have talked about it with him a few times I think though it is probably  time to go ahead and do this at this point. 02-09-2022 upon evaluation today patient appears to be doing well with regard to his legs. On the left this appears to be completely healed. On the right he does have 2 areas and be perfectly honest one of them is a skin cancer that he is going to the Mohs surgery clinic for the other seems to be healing nicely. Readmission: 08-02-2022 upon evaluation today patient appears for reevaluation here in our clinic concerning issues that he has been having with wounds over the bilateral lower extremities. I last saw him in May 2023 and at that point we had him completely healed. Unfortunately he is tells me this has broken down to some degree since that point. 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 He tells me this 1 seems to just be getting bigger and bigger not improving. Fortunately he is not having  any significant pain which is good news he does have quite a bit of swelling and he tells me that his fluid pills are not recommended for him to take daily but just in 3-day intervals here and there. 08-09-2022 upon evaluation today patient appears to be doing still somewhat poorly in regard to his legs although in general he does not appear to be feeling as good as he has been. Fortunately there does not appear to be any signs of infection which is good news. With that being said he is having some issues here with having and overall poor feeling in general which again is good I think going to be the biggest complicating factor. He actually seems to be coughing I do not hear any wheezing right now I did listen to his chest he did not have good airflow down low however makes me suspicious for bronchitis or even possibly pneumonia which could be part of what is going on  here as well. Fortunately I do not see any evidence of anything worsening in regard to his legs but I definitely believe that he needs to continue with the compression wraps he took them off yesterday to shower has not had anything on for 24 hours that is why his legs are so swollen today. With regard to his pathology report I did review that it showed some squamous abnormality but no signs of distinct carcinoma. With that being said it was SINGLETON, HICKOX ([id number]) [id number] Page 3 of 10 saying that it could be adjacent to a squamous cell carcinoma nonetheless my suggestion is can be that we have the patient take copy of this report and give it to his Mohs surgeon in order for them to see if there is anything they feel like needs to be done further. With that being said right now I feel like the primary thing is going to be for [place] to try to get his swelling down and keep that down into that hand since he is having so much drainage I believe we can have to bring him in for dressing changes twice a week doing a nurse visit on Mondays. 11/9; since the patient was last here he spent the night in the emergency room he received IV Lasix. Also received antibiotics although he was not discharged on either 1 of these. He also saw his cardiology office who put him on regular Lasix 20 mg [previously on as needed Lasix 20 mg]. Per our intake nurse the swelling in his legs is remarkably better but he still has bilateral lower extremity wounds. He still has wounds on the bilateral lower extremities most problematically on the left lateral calf. He has been using silver alginate under 3 layer compression. 08-23-2022 upon evaluation today patient appears to be doing much better than the last time I saw him 2 weeks ago. At that point I was very concerned about how he was doing he did see Dr. Caryn Section his primary care provider they got him on some blood pressure medication in general his  color and overall appearance looks to be doing much improved compared to the last time I saw him. 09-04-2022 upon evaluation today patient appears to be doing well currently in regard to his wounds. Everything is showing signs of improvement which is great news. Fortunately there does not appear to be any signs of active infection locally or systemically at this time. No fevers, chills, nausea, vomiting, or diarrhea. 09-10-2022 upon evaluation today patient appears to be doing  better in regard to his wounds although the [facility] was extremely stuck to the wound bed. Fortunately there does not appear to be any signs of infection locally or systemically at this time which is great news. No fevers, chills, nausea, vomiting, or diarrhea. 09-17-2022 upon evaluation today patient appears to be doing well currently in regard to his wounds in general. The right leg actually showing signs of excellent improvement and very pleased with where things stand in that regard. Fortunately I do not see any evidence of infection locally or systemically at this time which is great news. No fevers, chills, nausea, vomiting, or diarrhea. 09-24-2022 upon evaluation today patient appears to be doing well currently in regard to his wounds. Things look to be doing quite well. With that being said he did have a result unfortunately on the pathology which showed that he did have a squamous cell carcinoma noted on the biopsy sample I sent last week. He is seeing his dermatologist tomorrow in that regard. With that being said other than that however he seems to really be making some pretty good progress here which is good news. No fevers, chills, nausea, vomiting, or diarrhea. 12/26; the patient has 2 open wounds remaining on the left leg. One is on the left anterior mid tibia and the other is on the right lateral knee just outside of the popliteal fossa. The latter wound apparently has been biopsied showing squamous cell  carcinoma. The patient has been to see dermatology Dr. Evorn [name] who apparently is making him a referral to the [facility] center. He does not yet have an appointment 10-12-2022 upon evaluation today patient appears to be doing well currently in regard to his wound. He has been tolerating the dressing changes without complication and overall feel like we are headed in the right direction. Fortunately I do not see any signs of infection locally or systemically at this time which is great news. No fevers, chills, nausea, vomiting, or diarrhea. 10-23-2022 upon evaluation today patient appears to be doing well currently in regard to his wound. He has been tolerating the dressing changes without complication. Fortunately there does not appear to be any signs of active infection locally nor systemically which is great news and overall I am extremely pleased with where we stand currently. No fevers, chills, nausea, vomiting, or diarrhea. Electronic Signature(s) Signed: 10/23/2022 5:04:19 PM By: [name] PA-C Entered By: [name] on 10/23/2022 17:04:18 -------------------------------------------------------------------------------- Physical Exam Details Patient Name: Date of Service: [place] [name] 10/23/2022 4:00 PM Medical Record Number: [id number] Patient Account Number: [id number] Date of Birth/Sex: Treating RN: 10-24-1945 (64 [name].o. [name] Primary Care Provider: [name] Other Clinician: Referring Provider: Treating Provider/Extender: [name] in Treatment: 47 Constitutional Well-nourished and well-hydrated in no acute distress. Respiratory normal breathing without difficulty. Psychiatric this patient is able to make decisions and demonstrates good insight into disease process. Alert and Oriented x 3. pleasant and cooperative. Notes Upon inspection patient's wounds are draining quite a bit and I am concerned a bit about the  possibility of infection. I discussed that with the patient today. [name], EREKSON [name] ([id number]) [id number] Page 4 of 10 With that being said I do believe that he could benefit from putting back on an antibiotic it has been a while in fact November since we last utilize the Bactrim and he did very well with that back at that time. Electronic Signature(s) Signed: 10/23/2022 5:04:42 PM  By: [name] PA-C Entered By: [name] on 10/23/2022 17:04:42 -------------------------------------------------------------------------------- Physician Orders Details Patient Name: Date of Service: [place], [name] 10/23/2022 4:00 PM Medical Record Number: [id number] Patient Account Number: [id number] Date of Birth/Sex: Treating RN: [birth year] (69 [name].o. [name], [name] Primary Care Provider: [name] Other Clinician: Referring Provider: Treating Provider/Extender: [name] in Treatment: 11 Verbal / Phone Orders: No Diagnosis Coding ICD-10 Coding Code Description [phone] Chronic venous hypertension (idiopathic) with ulcer and inflammation of bilateral lower extremity L97.822 Non-pressure chronic ulcer of other part of left lower leg with fat layer exposed L97.812 Non-pressure chronic ulcer of other part of right lower leg with fat layer exposed I73.89 Other specified peripheral vascular diseases I48.0 Paroxysmal atrial fibrillation I10 Essential (primary) hypertension Follow-up Appointments Return Appointment in 1 week. Bathing/ Shower/ Hygiene May shower with wound dressing protected with water repellent cover or cast protector. No tub bath. Anesthetic (Use 'Patient Medications' Section for Anesthetic Order Entry) Wound #9 Left,Lateral Lower Leg Lidocaine applied to wound bed Edema Control - Lymphedema / Segmental Compressive Device / Other Bilateral Lower Extremities 3 Layer Compression System for Lymphedema. - left lower  leg Patient to wear own compression stockings. Remove compression stockings every night before going to bed and put on every morning when getting up. - right lower leg Elevate, Exercise Daily and A void Standing for Long Periods of Time. Elevate legs to the level of the heart and pump ankles as often as possible Elevate leg(s) parallel to the floor when sitting. DO YOUR BEST to sleep in the bed at night. DO NOT sleep in your recliner. Long hours of sitting in a recliner leads to swelling of the legs and/or potential wounds on your backside. Additional Orders / Instructions Follow Nutritious Diet and Increase Protein Intake Wound Treatment Wound #15 - Lower Leg Wound Laterality: Left, Posterior Prim Dressing: Silvercel Small 2x2 (in/in) 1 x Per Day/30 Days ary Discharge Instructions: Apply Silvercel Small 2x2 (in/in) as instructed Secondary Dressing: ABD Pad 5x9 (in/in) 1 x Per Day/30 Days Discharge Instructions: Cover with ABD pad DAIMIEN, PATMON [name] ([id number]) [id number] Page 5 of 10 Wound #19 - Lower Leg Wound Laterality: Left, Medial Prim Dressing: Silvercel Small 2x2 (in/in) 1 x Per Day/30 Days ary Discharge Instructions: Apply Silvercel Small 2x2 (in/in) as instructed Secondary Dressing: ABD Pad 5x9 (in/in) 1 x Per Day/30 Days Discharge Instructions: Cover with ABD pad Wound #9 - Lower Leg Wound Laterality: Left, Lateral Prim Dressing: Silvercel Small 2x2 (in/in) 1 x Per Day/30 Days ary Discharge Instructions: Apply Silvercel Small 2x2 (in/in) as instructed Secondary Dressing: ABD Pad 5x9 (in/in) 1 x Per Day/30 Days Discharge Instructions: Cover with ABD pad Patient Medications llergies: No Known Allergies A Notifications Medication Indication Start End 10/23/2022 Bactrim DS DOSE 1 - oral 800 mg-160 mg tablet - 1 tablet oral twice a day x 14 days Electronic Signature(s) Signed: 10/23/2022 5:06:55 PM By: [name] PA-C Entered By: [name] on 10/23/2022 17:06:55 -------------------------------------------------------------------------------- Problem List Details Patient Name: Date of Service: [place], [name] 10/23/2022 4:00 PM Medical Record Number: [id number] Patient Account Number: [id number] Date of Birth/Sex: Treating RN: 09-04-1946 (77 [name].o. [name] Primary Care Provider: [name] Other Clinician: Referring Provider: Treating Provider/Extender: [name] in Treatment: 11 Active Problems ICD-10 Encounter Code Description Active Date MDM Diagnosis [phone] Chronic venous hypertension (idiopathic) with ulcer and inflammation of 08/02/2022 No Yes bilateral lower  extremity [phone] Non-pressure chronic ulcer of other part of left lower leg with fat layer 08/02/2022 No Yes exposed L97.812 Non-pressure chronic ulcer of other part of right lower leg with fat layer 08/02/2022 No Yes exposed I73.89 Other specified peripheral vascular diseases 08/02/2022 No Yes ZYEN, TRIGGS [name] ([id number]) [id number] Page 6 of 10 I48.0 Paroxysmal atrial fibrillation 08/02/2022 No Yes I10 Essential (primary) hypertension 08/02/2022 No Yes Inactive Problems Resolved Problems Electronic Signature(s) Signed: 10/23/2022 4:06:28 PM By: [name] PA-C Entered By: [name] on 10/23/2022 16:06:28 -------------------------------------------------------------------------------- Progress Note Details Patient Name: Date of Service: [place] [name] 10/23/2022 4:00 PM Medical Record Number: [id number] Patient Account Number: [id number] Date of Birth/Sex: Treating RN: Jun 17, 1946 (55 [name].o. [name], [name] Primary Care Provider: [name] Other Clinician: Referring Provider: Treating Provider/Extender: [name] in Treatment: 11 Subjective Chief Complaint Information obtained from Patient Bilateral LE Ulcers History of Present Illness  (HPI) 77 year old male who has a past medical history of essential hypertension, chronic atrial fibrillation, peripheral vascular disease, nonischemic cardiomyopathy,venous stasis dermatitis, gouty arthropathy, basal cell carcinoma of the right lower extremity, benign prostatic hypertrophy, long-term use of anticoagulation therapy, hyperglycemia and exercise intolerance has never been a smoker. the patient has had a vascular workup over 7 years ago and said everything was normal at that stage. He does not have any chronic problems except for cardiac issues which he sees a cardiologist in [place]. 08/15/2017 -- arterial and venous duplex studies still pending. 08/23/2017 -- venous reflux studies done on 08/13/2017 shows venous incompetence throughout the left lower extremity deep system and focally at the left saphenofemoral junction. 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 No evidence of SVT or DVT in bilateral lower extremities The patient has an appointment at the end of the month to get his arterial duplex study done 09/05/2017 -- the patient was seen at the vein and vascular office yesterday by Angelena Form. ABI studies were notable for medial calcification and the toe brachial indices were normal and bilateral ankle-brachial) waveforms were normal with triphasic flow. After review of his venous studies he was not a candidate for laser ablation and his lymphedema was to be treated with compression stockings and lymphedema pump pumps 09/12/2017 -- had a low arterial study done at the [facility] -- unable to obtain reliable ABI is due to medial calcification. Bilateral toe indices were normal with the right being 1.01 and the left being 0.92 and the waveforms were triphasic bilaterally. he did get hold of 30-40 mm compression stockings but is unable to put these on. We will try and get him alternative compression stockings. 09/26/17- he is here in  follow up evaluation of a right lower extremity ulcer;he is compliant in wearing compression stocking; ulcer almost epithelialized , anticipate healing next appointment Readmission: 11/17 point upon evaluation patient's wound currently that he is seeing [place] for today is a skin cancerous lesion that was cleared away by his dermatologist on the left medial calf region. He tells me that this is a very similar thing to what he had done previously in fact the last time he saw him in 2018 this was also what was going on at that point. Nonetheless he feels that based on what he seeing currently that this is just having a lot of harder time healing although it is much closer to the surface than what he is experienced in the past. He notes that the initial  removal was in June 2022 which was this year this is now November and still has not closed. He does have some edema and definitely I think that there is some venous component to his slow healing here. Also think that we can do something better than Vaseline to try to help with getting this to clear up as quickly as possible. He does have a history of atrial fibrillation and is on Eliquis otherwise he really has no major medical problems that would affect wound healing. [name], VIRNIG [name] ([id number]) [id number] Page 7 of 10 09/07/2021 upon evaluation today patient actually appears to be doing significantly better after having wrapped him last week. Overall I think that this is making significant improvements at this time which is great news. I do not see any evidence of infection which is great news as well. No fevers, chills, nausea, vomiting, or diarrhea. 09/14/2021 upon evaluation today patient appears to be doing well with regard to his leg ulcer. He has been tolerating the dressing changes and overall I think that he is making excellent progress. I do not see any signs of active infection at this time. 09/21/2021 upon evaluation  today patient actually appears to be making good progress with regard to his wound this is again measuring smaller today no debridement seems to be necessary. We have been using a silver collagen dressing and I think that is doing an awesome job. 09/28/2021 upon evaluation today patient appears to be doing well with regard to his leg currently. I do not see any signs of active infection at this time which is great news. No fevers, chills, nausea, vomiting, or diarrhea. I think this wound is very close to complete resolution. 10/12/2021 upon evaluation today patient actually appears to be doing awesome in regard to his leg ulcer. In fact this appears to be completely healed based on what I am seeing currently. I do not see any evidence of active infection locally nor systemically at this time which is also great news. No fevers, chills, nausea, vomiting, or diarrhea. Readmission: 12/07/2021 upon evaluation today patient presents for readmission here in the clinic. He was discharged on 10/12/2021 is completely healed. Unfortunately this has reopened at this point and he is having continual issues with new blisters over both lower extremities. This is even worse than what we previously saw. Nonetheless we did actually check his ABIs today and it did reveal that his ABIs were 0.55 on the left and 0.57 on the right. Subsequently this is a definite change from his last arterial study which showed that he did have good blood flow at 1.01 on the right and 0.92 on the left and that was right at the beginning of 2019. Nonetheless based on what we see currently I do think he tolerated the 3 layer compression wrap but I do believe that we probably need to get him tested for his arterial flow in order to see where things stand and if there is something we can do there that would help prevent this from continue to be an ongoing issue. He did not utilize compression socks in the interim from when he was last here till this  time. That something is probably going to need lifelong going forward as well. 3/9; patient presents for follow-up. He has no issues or complaints today. He tolerated the compression wrap well. He had ABIs with TBI's done. He denies signs of infection. 12/21/2021 upon evaluation today patient appears to be doing well with regard to the  wounds on his legs. Both are showing signs of significant improvement which is great news although I do believe some sharp debridement would be of benefit here as well. 12/28/2021 upon evaluation today patient appears to be doing well with regard to his wounds. Everything is showing signs of excellent improvement which I am very pleased about. I think that we are headed in the right direction here. Fortunately there does not appear to be any evidence of infection which is great news there is a little bit of hypergranulation. 01/04/2022 upon evaluation today patient appears to be doing well with regard to his wounds 2 of them are healed 1 is almost so and the other 1 is significantly better. Overall I am extremely pleased with where we stand and I think that he is making excellent progress here. I do not see any evidence of active infection locally nor systemically at this time. 01-16-2022 upon evaluation today patient's wound on the left leg is showing signs of doing quite well. Has not completely cleared at this point but it is much improved. Fortunately I do not see any signs of infection at this time. No fevers, chills, nausea, vomiting, or diarrhea. 01-23-2022 upon evaluation today patient's wound of the left leg actually appears to be pretty much completely healed which is great news. I do not see any signs of active infection locally or systemically which is excellent. With that being said on the right leg what wound is measuring smaller the other 1 is a new wound that just showed up fortunately its not too bad. Has been using Xeroform here and that seems to be doing  decently well which is great news. Unfortunately his blood pressure is significantly high we gave him the readings for the past 4-5 visits as well as a recommendation to make an appointment to go discuss this with his primary care provider patient states that he is going to look into doing this. 01-30-2022 upon evaluation today patient appears to be doing well with regard to his left leg everything appears to be healed. On the right leg the more anterior wound is healed the more medial wound that I been concerned about a possible skin cancer unfortunately still does not look great to me. I do believe that we should probably do a biopsy I have talked about it with him a few times I think though it is probably time to go ahead and do this at this point. 02-09-2022 upon evaluation today patient appears to be doing well with regard to his legs. On the left this appears to be completely healed. On the right he does have 2 areas and be perfectly honest one of them is a skin cancer that he is going to the Mohs surgery clinic for the other seems to be healing nicely. Readmission: 08-02-2022 upon evaluation today patient appears for reevaluation here in our clinic concerning issues that he has been having with wounds over the bilateral lower extremities. I last saw him in May 2023 and at that point we had him completely healed. Unfortunately he is tells me this has broken down to some degree since that point. 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 He tells me this 1 seems to just be getting bigger and bigger not improving. Fortunately  he is not having any significant pain which is good news he does have quite a bit of swelling and he tells me that his fluid pills are not recommended for him to take daily but  just in 3-day intervals here and there. 08-09-2022 upon evaluation today patient appears to be doing still somewhat poorly in regard to his legs although in general he does not appear to be feeling as good as he has been. Fortunately there does not appear to be any signs of infection which is good news. With that being said he is having some issues here with having and overall poor feeling in general which again is good I think going to be the biggest complicating factor. He actually seems to be coughing I do not hear any wheezing right now I did listen to his chest he did not have good airflow down low however makes me suspicious for bronchitis or even possibly pneumonia which could be part of what is going on here as well. Fortunately I do not see any evidence of anything worsening in regard to his legs but I definitely believe that he needs to continue with the compression wraps he took them off yesterday to shower has not had anything on for 24 hours that is why his legs are so swollen today. With regard to his pathology report I did review that it showed some squamous abnormality but no signs of distinct carcinoma. With that being said it was saying that it could be adjacent to a squamous cell carcinoma nonetheless my suggestion is can be that we have the patient take copy of this report and give it to his Mohs surgeon in order for them to see if there is anything they feel like needs to be done further. With that being said right now I feel like the primary thing is going to be for [place] to try to get his swelling down and keep that down into that hand since he is having so much drainage I believe we can have to bring him in for dressing changes twice a week doing a nurse visit on Mondays. 11/9; since the patient was last here he spent the night in the emergency room he received IV Lasix. Also received antibiotics although he was not discharged on either 1 of these. He also saw his cardiology office  who put him on regular Lasix 20 mg [previously on as needed Lasix 20 mg]. Per our intake nurse the swelling in his legs is remarkably better but he still has bilateral lower extremity wounds. He still has wounds on the bilateral lower extremities most problematically on the left lateral calf. He has been using silver alginate under 3 layer compression. 08-23-2022 upon evaluation today patient appears to be doing much better than the last time I saw him 2 weeks ago. At that point I was very concerned about how he was doing he did see Dr. Caryn Section his primary care provider they got him on some blood pressure medication in general his color and overall appearance looks to be doing much improved compared to the last time I saw him. CLEDITH, ABDOU [name] ([id number]) [id number] Page 8 of 10 09-04-2022 upon evaluation today patient appears to be doing well currently in regard to his wounds. Everything is showing signs of improvement which is great news. Fortunately there does not appear to be any signs of active infection locally or systemically at this time. No fevers, chills, nausea, vomiting, or diarrhea. 09-10-2022 upon evaluation today patient  appears to be doing better in regard to his wounds although the [facility] was extremely stuck to the wound bed. Fortunately there does not appear to be any signs of infection locally or systemically at this time which is great news. No fevers, chills, nausea, vomiting, or diarrhea. 09-17-2022 upon evaluation today patient appears to be doing well currently in regard to his wounds in general. The right leg actually showing signs of excellent improvement and very pleased with where things stand in that regard. Fortunately I do not see any evidence of infection locally or systemically at this time which is great news. No fevers, chills, nausea, vomiting, or diarrhea. 09-24-2022 upon evaluation today patient appears to be doing well  currently in regard to his wounds. Things look to be doing quite well. With that being said he did have a result unfortunately on the pathology which showed that he did have a squamous cell carcinoma noted on the biopsy sample I sent last week. He is seeing his dermatologist tomorrow in that regard. With that being said other than that however he seems to really be making some pretty good progress here which is good news. No fevers, chills, nausea, vomiting, or diarrhea. 12/26; the patient has 2 open wounds remaining on the left leg. One is on the left anterior mid tibia and the other is on the right lateral knee just outside of the popliteal fossa. 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 The patient has been to see dermatology Dr. Evorn [name] who apparently is making him a referral to the [facility] center. He does not yet have an appointment 10-12-2022 upon evaluation today patient appears to be doing well currently in regard to his wound. He has been tolerating the dressing changes without complication and overall feel like we are headed in the right direction. Fortunately I do not see any signs of infection locally or systemically at this time which is great news. No fevers, chills, nausea, vomiting, or diarrhea. 10-23-2022 upon evaluation today patient appears to be doing well currently in regard to his wound. He has been tolerating the dressing changes without complication. Fortunately there does not appear to be any signs of active infection locally nor systemically which is great news and overall I am extremely pleased with where we stand currently. No fevers, chills, nausea, vomiting, or diarrhea. Objective Constitutional Well-nourished and well-hydrated in no acute distress. Vitals Time Taken: 4:13 PM, Height: 75 in, Weight: 225 lbs, BMI: 28.1, Temperature: 97.7 F, Pulse: 69 bpm, Respiratory Rate: 16 breaths/min, Blood Pressure: 157/108  mmHg. Respiratory normal breathing without difficulty. Psychiatric this patient is able to make decisions and demonstrates good insight into disease process. Alert and Oriented x 3. pleasant and cooperative. General Notes: Upon inspection patient's wounds are draining quite a bit and I am concerned a bit about the possibility of infection. I discussed that with the patient today. With that being said I do believe that he could benefit from putting back on an antibiotic it has been a while in fact November since we last utilize the Bactrim and he did very well with that back at that time. Integumentary (Hair, Skin) Wound #15 status is Open. Original cause of wound was Gradually Appeared. The date acquired was: 08/28/2022. The wound has been in treatment 7 weeks. The wound is located on the Left,Posterior Lower Leg. The wound measures 0.5cm length x 0.6cm width x 0.1cm depth; 0.236cm^2 area and 0.024cm^3 volume. There is a medium  amount of sanguinous drainage noted. Wound #19 status is Open. Original cause of wound was Surgical Injury. The date acquired was: 10/19/2022. The wound is located on the Left,Medial Lower Leg. The wound measures 2.2cm length x 3.7cm width x 0.1cm depth; 6.393cm^2 area and 0.639cm^3 volume. Wound #9 status is Open. Original cause of wound was Gradually Appeared. The date acquired was: 01/20/2022. The wound has been in treatment 11 weeks. The wound is located on the Left,Lateral Lower Leg. The wound measures 4cm length x 2.5cm width x 0.1cm depth; 7.854cm^2 area and 0.785cm^3 volume. There is a large amount of serosanguineous drainage noted. 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 Bathing/ Shower/ Hygiene: May shower with wound dressing protected with water repellent cover or cast protector. No tub bath. Anesthetic (Use 'Patient Medications' Section for Anesthetic Order Entry): Wound #9 Left,Lateral Lower Leg: Lidocaine applied to wound bed Edema Control - Lymphedema / Segmental Compressive Device / Other: 3 Layer Compression System for Lymphedema. - left lower leg Patient to wear own compression stockings. Remove compression stockings every night before going to bed and put on every morning when getting up. - right lower leg Elevate, Exercise Daily and Avoid Standing for Long Periods of Time. Elevate legs to the level of the heart and pump ankles as often as possible Elevate leg(s) parallel to the floor when sitting. DO YOUR BEST to sleep in the bed at night. DO NOT sleep in your recliner. Long hours of sitting in a recliner leads to swelling of the legs and/or potential wounds on your backside. Additional Orders / Instructions: Follow Nutritious Diet and Increase Protein Intake The following medication(s) was prescribed: Bactrim DS oral 800 mg-160 mg tablet 1 1 tablet oral twice a day x 14 days starting 10/23/2022 WOUND #15: - Lower Leg Wound Laterality: Left, Posterior Prim Dressing: Silvercel Small 2x2 (in/in) 1 x Per Day/30 Days ary Discharge Instructions: Apply Silvercel Small 2x2 (in/in) as instructed Secondary Dressing: ABD Pad 5x9 (in/in) 1 x Per Day/30 Days Discharge Instructions: Cover with ABD pad WOUND #19: - Lower Leg Wound Laterality: Left, Medial Prim Dressing: Silvercel Small 2x2 (in/in) 1 x Per Day/30 Days ary Discharge Instructions: Apply Silvercel Small 2x2 (in/in) as instructed Secondary Dressing: ABD Pad 5x9 (in/in) 1 x Per  Day/30 Days Discharge Instructions: Cover with ABD pad WOUND #9: - Lower Leg Wound Laterality: Left, Lateral Prim Dressing: Silvercel Small 2x2 (in/in) 1 x Per Day/30 Days ary Discharge Instructions: Apply Silvercel Small 2x2 (in/in) as instructed Secondary Dressing: ABD Pad 5x9 (in/in) 1 x Per Day/30 Days Discharge Instructions: Cover with ABD pad 1. I am good recommend currently that we have the patient going continue to monitor for any signs of infection or worsening. Based on what I am seeing I do believe that put him back on the Bactrim DS could be beneficial for him and he is in agreement with that plan. 2. I am going to recommend as well that the patient should continue to utilize the compression wrapping. Will actually be seeing him on Friday and hopefully he should be doing okay by that time as far as  the drainage is concerned we will put on a fresh dressing and then we will go from there. 3. I am also going to recommend that we have the patient continue to elevate his legs much as possible to help with edema control. Obviously the more this he can do the better off he will be. We will see patient back for reevaluation in 1 week here in the clinic. If anything worsens or changes patient will contact our office for additional recommendations. Electronic Signature(s) Signed: 10/23/2022 5:07:06 PM By: [name] PA-C Entered By: [name] on 10/23/2022 17:07:05 -------------------------------------------------------------------------------- SuperBill Details Patient Name: Date of Service: [name], [name] 10/23/2022 Medical Record Number: [id number] Patient Account Number: [id number] Date of Birth/Sex: Treating RN: [birth year] (79 [name].o. [name] Primary Care Provider: [name] Other Clinician: Referring Provider: Treating Provider/Extender: [name] in Treatment: [address] Diagnosis Coding ICD-10 Codes BISHOP, VANDERWERF ([id number])  [id number] Page 10 of 10 Code Description [phone] Chronic venous hypertension (idiopathic) with ulcer and inflammation of bilateral lower extremity L97.822 Non-pressure chronic ulcer of other part of left lower leg with fat layer exposed L97.812 Non-pressure chronic ulcer of other part of right lower leg with fat layer exposed I73.89 Other specified peripheral vascular diseases I48.0 Paroxysmal atrial fibrillation I10 Essential (primary) hypertension Facility Procedures : CPT4 Code: [id number] Description: (Facility Use Only) 29581LT - APPLY MULTLAY COMPRS LWR LT LEG Modifier: Quantity: 1 Physician Procedures : CPT4 Code Description Modifier [id number] - WC PHYS LEVEL 4 - EST PT ICD-10 Diagnosis Description 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 L97.822 Non-pressure chronic ulcer of other  part of left lower leg with fat layer exposed L97.812 Non-pressure chronic ulcer of other part of right lower leg with fat layer exposed I73.89 Other specified peripheral vascular diseases Quantity: 1 Electronic Signature(s) Signed: 10/23/2022 5:07:18 PM By: [name] PA-C Entered By: [name] on 10/23/2022 17:07:18

## 2022-10-23 NOTE — Progress Notes (Addendum)
DORAL, VENTRELLA [name] ([id number]) [id number] Page 1 of 8 Visit Report for 10/23/2022 Arrival Information Details Patient Name: Date of Service: [name] 10/23/2022 4:00 PM Medical Record Number: [id number] Patient Account Number: [id number] Date of Birth/Sex: Treating RN: [birth year] (77 y.o. [name] Primary Care [name]: [name] Other Clinician: Referring [name]: Treating [name]/Extender: [name] in Treatment: 11 Visit Information History Since Last Visit Added or deleted any medications: No Patient Arrived: Ambulatory Has Dressing in Place as Prescribed: Yes Arrival Time: 16:10 Pain Present Now: No Accompanied By: self Transfer Assistance: None Patient Identification Verified: Yes Secondary Verification Process Completed: Yes Patient Requires Transmission-Based No Precautions: Patient Has Alerts: Yes Patient Alerts: Patient on Blood Thinner 12/13/21 TBI [name]; NOT diabetic HISTORY OF SKIN CANCERS Electronic Signature(s) Signed: 10/24/2022 11:43:47 PM By: [name] Entered By: [name], BSN, RN, [name] on 10/23/2022 16:10:22 -------------------------------------------------------------------------------- Encounter Discharge Information Details Patient Name: Date of Service: [name], [name] 10/23/2022 4:00 PM Medical Record Number: [id number] Patient Account Number: [id number] Date of Birth/Sex: Treating RN: 17-May-1946 (77 y.o. [name] Primary Care Ermagene Saidi: [name] Other Clinician: Referring Chandell Attridge: Treating [name]/Extender: [name] in Treatment: 11 Encounter Discharge Information Items Discharge Condition: Stable Ambulatory Status: Ambulatory Discharge Destination: Home Transportation: Private Auto Schedule Follow-up Appointment: Yes Clinical Summary of Care: [name] ([id number])  [phone].pdf Page 2 of 8 Electronic Signature(s) Signed: 10/24/2022 11:43:47 PM By: [name] Entered By: [name], BSN, RN, [name] on 10/23/2022 16:43:02 -------------------------------------------------------------------------------- Lower Extremity Assessment Details Patient Name: Date of Service: [name], [name] 10/23/2022 4:00 PM Medical Record Number: [id number] Patient Account Number: [id number] Date of Birth/Sex: Treating RN: 18-Jul-1946 (77 y.o. [name] Primary Care [name]: [name] Other Clinician: Referring Yuleimy Kretz: Treating [name]/Extender: [name] in Treatment: 11 Edema Assessment Assessed: [name]: No] [name]: No] [Left: Edema] [Right: :] Calf Left: Right: Point of Measurement: 34 cm From Medial Instep 38 cm Ankle Left: Right: Point of Measurement: 12 cm From Medial Instep 25 cm Vascular Assessment Pulses: Dorsalis Pedis Palpable: [Left:Yes] Electronic Signature(s) Signed: 10/24/2022 11:43:47 PM By: [name] Entered By: [name], BSN, RN, [name] on 10/23/2022 16:13:12 -------------------------------------------------------------------------------- Multi Wound Chart Details Patient Name: Date of Service: [name], [name] 10/23/2022 4:00 PM Medical Record Number: [id number] Patient Account Number: [id number] Date of Birth/Sex: Treating RN: April 16, 1946 (76 y.o. [name] Primary Care [name]: [name] Other Clinician: Referring [name]: Treating [name]/Extender: [name] in Treatment: [address], [place] ([id number]) [id number] Page 3 of 8 Vital Signs Height(in): 75 Pulse(bpm): 69 Weight(lbs): 225 Blood Pressure(mmHg): 157/108 Body Mass Index(BMI): 28.1 Temperature(F): 97.7 Respiratory Rate(breaths/min): 16 [15:Photos:] Left, Posterior Lower Leg Left, Medial Lower Leg Left,  Lateral Lower Leg Wound Location: Gradually Appeared Surgical Injury Gradually Appeared 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 Primary Etiology: 08/28/2022 10/19/2022 01/20/2022 Date Acquired: 7 0 11 Weeks of Treatment: Open Open Open Wound Status: No No No Wound Recurrence: 0.5x0.6x0.1 2.2x3.7x0.1 4x2.5x0.1 Measurements L x W x D (cm) 0.236 6.393 7.854 [name] (cm) : rea 0.024 0.639 0.785 Volume (cm) : 91.70% N/[name] 55.30% % Reduction in [name] rea: 14.30% N/[name] 77.60% % Reduction in Volume: Full Thickness Without Exposed N/[name] Full Thickness Without Exposed Classification: Support Structures Support Structures Medium N/[name] Large Exudate Amount: Sanguinous  N/[name] Serosanguineous Exudate Type: red N/[name] red, brown Exudate Color: Treatment Notes Electronic Signature(s) Signed: 10/24/2022 11:43:47 PM By: [name] Entered By: [name], BSN, RN, [name] on 10/23/2022 16:22:39 -------------------------------------------------------------------------------- Pain Assessment Details Patient Name: Date of Service: [name] [place] [name] 10/23/2022 4:00 PM Medical Record Number: [id number] Patient Account Number: [id number] Date of Birth/Sex: Treating RN: September 02, 1946 (77 y.o. [name] Primary Care [name]: [name] Other Clinician: Referring [name]: Treating [name]/Extender: [name] in Treatment: 11 Active Problems Location of Pain Severity and Description of Pain Patient Has Paino No Site Locations [place], [place] ([id number]) [id number] Page 4 of 8 Pain Management and Medication Current Pain Management: Notes Patient denies pain at this time. Electronic Signature(s) Signed: 10/24/2022 11:43:47 PM By: [name] Entered By: [name], BSN, RN, [name] on 10/23/2022  16:10:58 -------------------------------------------------------------------------------- Patient/Caregiver Education Details Patient Name: Date of Service: [name] 1/16/2024andnbsp4:00 PM Medical Record Number: [id number] Patient Account Number: [id number] Date of Birth/Gender: Treating RN: 03/25/1946 (77 y.o. [name] Primary Care Physician: [name] Other Clinician: Referring Physician: Treating Physician/Extender: [name] in Treatment: 11 Education Assessment Education Provided To: Patient Education Topics Provided Venous: Handouts: Controlling Swelling with Multilayered Compression Wraps Methods: Demonstration, Explain/Verbal Responses: State content correctly Electronic Signature(s) Signed: 10/24/2022 11:43:47 PM By: [name] Entered By: [name], BSN, RN, [name] on 10/23/2022 16:42:15 [name] ([id number]) [id number] Page 5 of 8 -------------------------------------------------------------------------------- Wound Assessment Details Patient Name: Date of Service: [name] 10/23/2022 4:00 PM Medical Record Number: [id number] Patient Account Number: [id number] Date of Birth/Sex: Treating RN: [birth year] (77 y.o. [name], [name] Primary Care Latarsha Zani: [name] Other Clinician: Referring Chelsa Stout: Treating Jerico Grisso/Extender: [name] in Treatment: 11 Wound Status Wound Number: 15 Primary Etiology: Venous Leg Ulcer Wound Location: Left, Posterior Lower Leg Wound Status: Open Wounding Event: Gradually Appeared Date Acquired: 08/28/2022 Weeks Of Treatment: 7 Clustered Wound: No Photos Photo Uploaded By: [name], BSN, RN, [name] on 10/23/2022 16:22:11 Wound Measurements Length: (cm) 0.5 Width: (cm) 0.6 Depth: (cm) 0.1 Area: (cm) 0.236 Volume: (cm) 0.024 % Reduction in Area: 91.7% % Reduction in Volume: 14.3% Wound  Description Classification: Full Thickness Without Exposed Support Exudate Amount: Medium Exudate Type: Sanguinous Exudate Color: red Structures Treatment Notes Wound #15 (Lower Leg) Wound Laterality: Left, Posterior Cleanser Peri-Wound Care Topical Primary Dressing Silvercel Small 2x2 (in/in) Discharge Instruction: Apply Silvercel Small 2x2 (in/in) as instructed Secondary Dressing ABD Pad 5x9 (in/in) Discharge Instruction: Cover with ABD pad LETCHER, SCHWEIKERT [name] ([id number]) [phone].pdf Page 6 of 8 Secured With Compression Wrap Compression Stockings Environmental education officer) Signed: 10/24/2022 11:43:47 PM By: [name] Entered By: [name], BSN, RN, [name] on 10/23/2022 16:09:40 -------------------------------------------------------------------------------- Wound Assessment Details Patient Name: Date of Service: [name] [place] [name] 10/23/2022 4:00 PM Medical Record Number: [id number] Patient Account Number: [id number] Date of Birth/Sex: Treating RN: 1945/10/11 (77 y.o. [name] Primary Care Fayola Meckes: [name] Other Clinician: Referring Lillian Ballester: Treating Maeola Mchaney/Extender: [name] in Treatment: 11 Wound Status Wound Number: 19 Primary Etiology: Atypical Wound Location: Left, Medial Lower Leg Wound Status: Open Wounding Event: Surgical Injury Date Acquired: 10/19/2022 Weeks Of Treatment: 0 Clustered Wound: No Photos Photo Uploaded By: [name], BSN, RN, [name] on 10/23/2022 16:22:11 Wound Measurements Length: (cm) Width: (  cm) Depth: (cm) Area: (cm) Volume: (cm) 2.2 % Reduction in Area: 3.7 % Reduction in Volume: 0.1 6.393 0.639 Treatment Notes Wound #19 (Lower Leg) Wound Laterality: Left, Medial Cleanser Peri-Wound Care Topical Primary Dressing BLAKELEY, MARGRAF [name] ([id number]) [id number] Page 7 of 8 Silvercel Small 2x2 (in/in) Discharge  Instruction: Apply Silvercel Small 2x2 (in/in) as instructed Secondary Dressing ABD Pad 5x9 (in/in) Discharge Instruction: Cover with ABD pad Secured With Compression Wrap Compression Stockings Add-Ons Electronic Signature(s) Signed: 10/24/2022 11:43:47 PM By: [name] Entered By: [name], BSN, RN, [name] on 10/23/2022 16:09:40 -------------------------------------------------------------------------------- Wound Assessment Details Patient Name: Date of Service: [name], [name] 10/23/2022 4:00 PM Medical Record Number: [id number] Patient Account Number: [id number] Date of Birth/Sex: Treating RN: 1946-01-20 (77 y.o. [name] Primary Care Godwin Tedesco: [name] Other Clinician: Referring Tonianne Fine: Treating Izaiyah Kleinman/Extender: [name] in Treatment: 11 Wound Status Wound Number: 9 Primary Etiology: Venous Leg Ulcer Wound Location: Left, Lateral Lower Leg Wound Status: Open Wounding Event: Gradually Appeared Date Acquired: 01/20/2022 Weeks Of Treatment: 11 Clustered Wound: No Photos Photo Uploaded By: [name], BSN, RN, [name] on 10/23/2022 16:22:12 Wound Measurements Length: (cm) 4 Width: (cm) 2.5 Depth: (cm) 0.1 Area: (cm) 7.854 Volume: (cm) 0.785 % Reduction in Area: 55.3% % Reduction in Volume: 77.6% Wound Description Classification: Full Thickness Without Exposed Support Structures Exudate Amount: Large Exudate Type: Serosanguineous Bouie, Gilberto [name] ([id number]) Exudate Color: red, brown [phone].pdf Page 8 of 8 Treatment Notes Wound #9 (Lower Leg) Wound Laterality: Left, Lateral Cleanser Peri-Wound Care Topical Primary Dressing Silvercel Small 2x2 (in/in) Discharge Instruction: Apply Silvercel Small 2x2 (in/in) as instructed Secondary Dressing ABD Pad 5x9 (in/in) Discharge Instruction: Cover with ABD pad Secured With Compression Wrap Compression Stockings Add-Ons Electronic  Signature(s) Signed: 10/24/2022 11:43:47 PM By: [name] Entered By: [name], BSN, RN, [name] on 10/23/2022 16:09:40 -------------------------------------------------------------------------------- Vitals Details Patient Name: Date of Service: [name], [name] 10/23/2022 4:00 PM Medical Record Number: [id number] Patient Account Number: [id number] Date of Birth/Sex: Treating RN: 1946-02-20 (77 y.o. [name] Primary Care Kahealani Yankovich: [name] Other Clinician: Referring Sophya Vanblarcom: Treating Dorsie Burich/Extender: [name] in Treatment: 11 Vital Signs Time Taken: 16:13 Temperature (F): 97.7 Height (in): 75 Pulse (bpm): 69 Weight (lbs): 225 Respiratory Rate (breaths/min): 16 Body Mass Index (BMI): 28.1 Blood Pressure (mmHg): 157/108 Reference Range: 80 - 120 mg / dl Electronic Signature(s) Signed: 10/24/2022 11:43:47 PM By: [name] Entered By: [name], BSN, RN, [name] on 10/23/2022 16:14:11

## 2022-10-24 ENCOUNTER — Encounter: Payer: Self-pay | Admitting: Family Medicine

## 2022-10-26 ENCOUNTER — Encounter: Payer: Medicare PPO | Admitting: Physician Assistant

## 2022-10-26 DIAGNOSIS — I10 Essential (primary) hypertension: Secondary | ICD-10-CM | POA: Diagnosis not present

## 2022-10-26 DIAGNOSIS — I87333 Chronic venous hypertension (idiopathic) with ulcer and inflammation of bilateral lower extremity: Secondary | ICD-10-CM | POA: Diagnosis not present

## 2022-10-26 DIAGNOSIS — I7389 Other specified peripheral vascular diseases: Secondary | ICD-10-CM | POA: Diagnosis not present

## 2022-10-26 DIAGNOSIS — Z85828 Personal history of other malignant neoplasm of skin: Secondary | ICD-10-CM | POA: Diagnosis not present

## 2022-10-26 DIAGNOSIS — I428 Other cardiomyopathies: Secondary | ICD-10-CM | POA: Diagnosis not present

## 2022-10-26 DIAGNOSIS — L97812 Non-pressure chronic ulcer of other part of right lower leg with fat layer exposed: Secondary | ICD-10-CM | POA: Diagnosis not present

## 2022-10-26 DIAGNOSIS — I480 Paroxysmal atrial fibrillation: Secondary | ICD-10-CM | POA: Diagnosis not present

## 2022-10-26 DIAGNOSIS — L97822 Non-pressure chronic ulcer of other part of left lower leg with fat layer exposed: Secondary | ICD-10-CM | POA: Diagnosis not present

## 2022-10-26 DIAGNOSIS — Z7901 Long term (current) use of anticoagulants: Secondary | ICD-10-CM | POA: Diagnosis not present

## 2022-10-26 NOTE — Progress Notes (Addendum)
[name], [name] [name] [name] ([id number]) [id number] Page 1 of 9 Visit Report for 10/26/2022 Chief Complaint Document Details Patient Name: Date of Service: [name] [name] [name] [name] 10/26/2022 10:00 [name] [name] Medical Record Number: [id number] Patient Account Number: [id number] Date of Birth/Sex: Treating RN: 1946-06-02 (77 y.o. [name] [name] [name] Primary Care Provider: Lelon [name] Other Clinician: Referring Provider: Treating Provider/Extender: [name] [name] [name] in Treatment: 12 Information Obtained from: Patient Chief Complaint Bilateral LE Ulcers Electronic Signature(s) Signed: 10/26/2022 10:00:26 AM By: [name] Keeler PA-C Entered By: [name] [name] [name] on 10/26/2022 10:00:25 -------------------------------------------------------------------------------- HPI Details Patient Name: Date of Service: [name] [name] [name], [name] [name] [name] 10/26/2022 10:00 [name] [name] Medical Record Number: [id number] Patient Account Number: [id number] Date of Birth/Sex: Treating RN: 12-15-1945 (77 y.o. 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 the patient has had [name] vascular workup over 7 years ago and said everything was normal at that stage. He does not have any chronic problems except for cardiac issues which he sees [name] cardiologist in [place]. 08/15/2017 -- arterial and venous duplex studies still pending. 08/23/2017 -- venous reflux studies done on  08/13/2017 shows venous incompetence throughout the left lower extremity deep system and focally at the left saphenofemoral junction. 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 No evidence of SVT or DVT in bilateral lower extremities The patient has an appointment at the end of the month to get his arterial duplex study done 09/05/2017 -- the patient was seen at the vein and vascular office yesterday by [name] [name] [name] ABI studies were notable for medial calcification and the toe brachial indices were normal and bilateral ankle-brachial) waveforms were normal with triphasic flow. After review of his venous studies he was not [name] candidate for laser ablation and his lymphedema was to be treated with compression stockings and lymphedema pump pumps 09/12/2017 -- had [name] low arterial study done at the [facility] -- unable to obtain reliable ABI is due to medial calcification. Bilateral toe [name] [name] [name] ([id number]) [id number] Page 2 of 9 indices were normal with the right being 1.01 and the left being 0.92 and the waveforms were triphasic bilaterally. he did get hold of 30-40 mm compression stockings but is unable to put these on. We will try and get him alternative compression stockings. 09/26/17- he is here in follow up evaluation of [name] right lower extremity ulcer;he is compliant in wearing compression stocking; ulcer almost epithelialized , anticipate healing next appointment Readmission: 11/17 point upon evaluation patient's wound currently that he is seeing [place] for today is [name] skin cancerous lesion that was cleared away by his dermatologist on the left medial calf region. He tells me that this is [name] very similar thing to what he had done previously in fact the last time he saw him in 2018 this was also what was going on at that point. Nonetheless he feels that based on what he seeing currently that this is just having [name] lot of harder  time healing although it is much closer to the surface than what he is experienced in  the past. He notes that the initial removal was in June 2022 which was this year this is now November and still has not closed. He does have some edema and definitely I think that there is some venous component to his slow healing here. Also think that we can do something better than Vaseline to try to help with getting this to clear up as quickly as possible. He does have [name] history of atrial fibrillation and is on Eliquis otherwise he really has no major medical problems that would affect wound healing. 09/07/2021 upon evaluation today patient actually appears to be doing significantly better after having wrapped him last week. Overall I think that this is making significant improvements at this time which is great news. I do not see any evidence of infection which is great news as well. No fevers, chills, nausea, vomiting, or diarrhea. 09/14/2021 upon evaluation today patient appears to be doing well with regard to his leg ulcer. He has been tolerating the dressing changes and overall I think that he is making excellent progress. I do not see any signs of active infection at this time. 09/21/2021 upon evaluation today patient actually appears to be making good progress with regard to his wound this is again measuring smaller today no debridement seems to be necessary. We have been using [name] silver collagen dressing and I think that is doing an awesome job. 09/28/2021 upon evaluation today patient appears to be doing well with regard to his leg currently. I do not see any signs of active infection at this time which is great news. No fevers, chills, nausea, vomiting, or diarrhea. I think this wound is very close to complete resolution. 10/12/2021 upon evaluation today patient actually appears to be doing awesome in regard to his leg ulcer. In fact this appears to be completely healed based on what I am seeing currently. I  do not see any evidence of active infection locally nor systemically at this time which is also great news. No fevers, chills, nausea, vomiting, or diarrhea. Readmission: 12/07/2021 upon evaluation today patient presents for readmission here in the clinic. He was discharged on 10/12/2021 is completely healed. Unfortunately this has reopened at this point and he is having continual issues with new blisters over both lower extremities. This is even worse than what we previously saw. Nonetheless we did actually check his ABIs today and it did reveal that his ABIs were 0.55 on the left and 0.57 on the right. Subsequently this is [name] definite change from his last arterial study which showed that he did have good blood flow at 1.01 on the right and 0.92 on the left and that was right at the beginning of 2019. Nonetheless based on what we see currently I do think he tolerated the 3 layer compression wrap but I do believe that we probably need to get him tested for his arterial flow in order to see where things stand and if there is something we can do there that would help prevent this from continue to be an ongoing issue. He did not utilize compression socks in the interim from when he was last here till this time. That something is probably going to need lifelong going forward as well. 3/9; patient presents for follow-up. He has no issues or complaints today. He tolerated the compression wrap well. He had ABIs with TBI's done. He denies signs of infection. 12/21/2021 upon evaluation today patient appears to be doing well with regard to the wounds on his  legs. Both are showing signs of significant improvement which is great news although I do believe some sharp debridement would be of benefit here as well. 12/28/2021 upon evaluation today patient appears to be doing well with regard to his wounds. Everything is showing signs of excellent improvement which I am very pleased about. I think that we are headed in the  right direction here. Fortunately there does not appear to be any evidence of infection which is great news there is [name] little bit of hypergranulation. 01/04/2022 upon evaluation today patient appears to be doing well with regard to his wounds 2 of them are healed 1 is almost so and the other 1 is significantly better. Overall I am extremely pleased with where we stand and I think that he is making excellent progress here. I do not see any evidence of active infection locally nor systemically at this time. 01-16-2022 upon evaluation today patient's wound on the left leg is showing signs of doing quite well. Has not completely cleared at this point but it is much improved. Fortunately I do not see any signs of infection at this time. No fevers, chills, nausea, vomiting, or diarrhea. 01-23-2022 upon evaluation today patient's wound of the left leg actually appears to be pretty much completely healed which is great news. I do not see any signs of active infection locally or systemically which is excellent. With that being said on the right leg what wound is measuring smaller the other 1 is [name] new wound that just showed up fortunately its not too bad. Has been using Xeroform here and that seems to be doing decently well which is great news. Unfortunately his blood pressure is significantly high we gave him the readings for the past 4-5 visits as well as [name] recommendation to make an appointment to go discuss this with his primary care provider patient states that he is going to look into doing this. 01-30-2022 upon evaluation today patient appears to be doing well with regard to his left leg everything appears to be healed. On the right leg the more anterior wound is healed the more medial wound that I been concerned about [name] possible skin cancer unfortunately still does not look great to me. I do believe that we should probably do [name] biopsy I have talked about it with him [name] few times I think though it is probably  time to go ahead and do this at this point. 02-09-2022 upon evaluation today patient appears to be doing well with regard to his legs. On the left this appears to be completely healed. On the right he does have 2 areas and be perfectly honest one of them is [name] skin cancer that he is going to the Mohs surgery clinic for the other seems to be healing nicely. Readmission: 08-02-2022 upon evaluation today patient appears for reevaluation here in our clinic concerning issues that he has been having with wounds over the bilateral lower extremities. I last saw him in May 2023 and at that point we had him completely healed. Unfortunately he is tells me this has broken down to some degree since that point. 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 He tells me this 1 seems to just be getting bigger and bigger not improving. Fortunately he is  not having any significant pain which is good news he does have quite [name] bit of swelling and he tells me that his fluid pills are not recommended for him to take daily but just in 3-day intervals here and there. 08-09-2022 upon evaluation today patient appears to be doing still somewhat poorly in regard to his legs although in general he does not appear to be feeling as good as he has been. Fortunately there does not appear to be any signs of infection which is good news. With that being said he is having some issues here with having and overall poor feeling in general which again is good I think going to be the biggest complicating factor. He actually seems to be coughing I do not hear any wheezing right now I did listen to his chest he did not have good airflow down low however makes me suspicious for bronchitis or even possibly pneumonia which could be part of what is going on  here as well. Fortunately I do not see any evidence of anything worsening in regard to his legs but I definitely believe that he needs to continue with the compression wraps he took them off yesterday to shower has not had anything on for 24 hours that is why his legs are so swollen today. With regard to his pathology report I did review that it showed some squamous abnormality but no signs of distinct carcinoma. With that being said it was [name] [name] [name], [name] [name] [name] ([id number]) [id number] Page 3 of 9 saying that it could be adjacent to [name] squamous cell carcinoma nonetheless my suggestion is can be that we have the patient take copy of this report and give it to his Mohs surgeon in order for them to see if there is anything they feel like needs to be done further. With that being said right now I feel like the primary thing is going to be for [place] to try to get his swelling down and keep that down into that hand since he is having so much drainage I believe we can have to bring him in for dressing changes twice [name] week doing [name] nurse visit on Mondays. 11/9; since the patient was last here he spent the night in the emergency room he received IV Lasix. Also received antibiotics although he was not discharged on either 1 of these. He also saw his cardiology office who put him on regular Lasix 20 mg [previously on as needed Lasix 20 mg]. Per our intake nurse the swelling in his legs is remarkably better but he still has bilateral lower extremity wounds. He still has wounds on the bilateral lower extremities most problematically on the left lateral calf. He has been using silver alginate under 3 layer compression. 08-23-2022 upon evaluation today patient appears to be doing much better than the last time I saw him 2 weeks ago. At that point I was very concerned about how he was doing he did see Dr. Caryn Section his primary care provider they got him on some blood pressure medication in general his  color and overall appearance looks to be doing much improved compared to the last time I saw him. 09-04-2022 upon evaluation today patient appears to be doing well currently in regard to his wounds. Everything is showing signs of improvement which is great news. Fortunately there does not appear to be any signs of active infection locally or systemically at this time. No fevers, chills, nausea, vomiting, or diarrhea. 09-10-2022 upon evaluation today patient appears to  be doing better in regard to his wounds although the [name] was extremely stuck to the wound bed. Fortunately there does not appear to be any signs of infection locally or systemically at this time which is great news. No fevers, chills, nausea, vomiting, or diarrhea. 09-17-2022 upon evaluation today patient appears to be doing well currently in regard to his wounds in general. The right leg actually showing signs of excellent improvement and very pleased with where things stand in that regard. Fortunately I do not see any evidence of infection locally or systemically at this time which is great news. No fevers, chills, nausea, vomiting, or diarrhea. 09-24-2022 upon evaluation today patient appears to be doing well currently in regard to his wounds. Things look to be doing quite well. 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 He is seeing his dermatologist tomorrow in that regard. With that being said other than that however he seems to really be making some pretty good progress here which is good news. No fevers, chills, nausea, vomiting, or diarrhea. 12/26; the patient has 2 open wounds remaining on the left leg. One is on the left anterior mid tibia and the other is on the right lateral knee just outside of the popliteal fossa. The latter wound apparently has been biopsied showing squamous cell  carcinoma. The patient has been to see dermatology Dr. Evorn Gong who apparently is making him [name] referral to the [facility] center. He does not yet have an appointment 10-12-2022 upon evaluation today patient appears to be doing well currently in regard to his wound. He has been tolerating the dressing changes without complication and overall feel like we are headed in the right direction. Fortunately I do not see any signs of infection locally or systemically at this time which is great news. No fevers, chills, nausea, vomiting, or diarrhea. 10-23-2022 upon evaluation today patient appears to be doing well currently in regard to his wound. He has been tolerating the dressing changes without complication. Fortunately there does not appear to be any signs of active infection locally nor systemically which is great news and overall I am extremely pleased with where we stand currently. No fevers, chills, nausea, vomiting, or diarrhea. 10-26-2022 upon evaluation today patient appears to be doing well currently in regard to his wounds. Everything is showing signs of improvement and this is great news. Fortunately I see no evidence of active infection systemically. He does seem to be doing much better in regard to the local infection in regard to his leg. The smell is also greatly improved. Overall I am extremely happy with where we stand today. This is after just [name] few days with the antibiotic on board. Electronic Signature(s) Signed: 10/26/2022 10:35:59 AM By: [name] Keeler PA-C Entered By: [name] [name] [name] on 10/26/2022 10:35:59 -------------------------------------------------------------------------------- Physical Exam Details Patient Name: Date of Service: [name] [name] [name] [name], [name] [name] [name] 10/26/2022 10:00 [name] [name] Medical Record Number: [id number] Patient Account Number: [id number] Date of Birth/Sex: Treating RN: [birth year] (77 y.o. [name] [name] [name] Primary Care Provider: Lelon [name] Other  Clinician: Referring Provider: Treating Provider/Extender: [name] [name] [name] in Treatment: 60 Constitutional Well-nourished and well-hydrated in no acute distress. Respiratory normal breathing without difficulty. Psychiatric this patient is able to make decisions and demonstrates good insight into disease process. Alert and Oriented x 3. pleasant and  cooperative. [name] [name] [name], [name] [name] [name] ([id number]) [id number] Page 4 of 9 Notes Upon inspection patient's wound bed actually showed signs of excellent improvement pretty much across the board I feel like things are drying up quite nicely and the cellulitis seems to be improving. I am pleased with what we are seeing I think he is headed in the right direction here. Electronic Signature(s) Signed: 10/26/2022 10:36:15 AM By: [name] Keeler PA-C Entered By: [name] [name] [name] on 10/26/2022 10:36:15 -------------------------------------------------------------------------------- Physician Orders Details Patient Name: Date of Service: [name] [place], [name] [name] [name] 10/26/2022 10:00 [name] [name] Medical Record Number: [id number] Patient Account Number: [id number] Date of Birth/Sex: Treating RN: 1946/06/06 (77 y.o. [name] [name] [name], [name] [name] [name] Primary Care Provider: Lelon [name] Other Clinician: Referring Provider: Treating Provider/Extender: [name] [name] [name] in Treatment: 12 Verbal / Phone Orders: No Diagnosis Coding ICD-10 Coding Code Description [phone] Chronic venous hypertension (idiopathic) with ulcer and inflammation of bilateral lower extremity L97.822 Non-pressure chronic ulcer of other part of left lower leg with fat layer exposed L97.812 Non-pressure chronic ulcer of other part of right lower leg with fat layer exposed I73.89 Other specified peripheral vascular diseases I48.0 Paroxysmal atrial fibrillation I10 Essential (primary) hypertension Follow-up Appointments Return Appointment in 1 week. Nurse Visit as  needed Bathing/ Shower/ Hygiene May shower with wound dressing protected with water repellent cover or cast protector. No tub bath. Anesthetic (Use 'Patient Medications' Section for Anesthetic Order Entry) Wound #9 Left,Lateral Lower Leg Lidocaine applied to wound bed Edema Control - Lymphedema / Segmental Compressive Device / Other Bilateral Lower Extremities 3 Layer Compression System for Lymphedema. - left lower leg Patient to wear own compression stockings. Remove compression stockings every night before going to bed and put on every morning when getting up. - right lower leg Elevate, Exercise Daily and [name] void Standing for Long Periods of Time. Elevate legs to the level of the heart and pump ankles as often as possible Elevate leg(s) parallel to the floor when sitting. DO YOUR BEST to sleep in the bed at night. DO NOT sleep in your recliner. Long hours of sitting in [name] recliner leads to swelling of the legs and/or potential wounds on your backside. Additional Orders / Instructions Follow Nutritious Diet and Increase Protein Intake Medications-Please add to medication list. ntibiotics - Continue antibiotics as prescribed until completed. P.O. [name] Wound Treatment Wound #15 - Lower Leg Wound Laterality: Left, Posterior [name] [name] [name], [name] [name] [name] ([id number]) [id number] Page 5 of 9 Prim Dressing: Silvercel Small 2x2 (in/in) 1 x Per Day/30 Days ary Discharge Instructions: Apply Silvercel Small 2x2 (in/in) as instructed Secondary Dressing: ABD Pad 5x9 (in/in) 1 x Per Day/30 Days Discharge Instructions: Cover with ABD pad Wound #19 - Lower Leg Wound Laterality: Left, Medial Prim Dressing: Silvercel Small 2x2 (in/in) 1 x Per Day/30 Days ary Discharge Instructions: Apply Silvercel Small 2x2 (in/in) as instructed Secondary Dressing: ABD Pad 5x9 (in/in) 1 x Per Day/30 Days Discharge Instructions: Cover with ABD pad Wound #9 - Lower Leg Wound Laterality: Left, Lateral Prim  Dressing: Silvercel Small 2x2 (in/in) 1 x Per Day/30 Days ary Discharge Instructions: Apply Silvercel Small 2x2 (in/in) as instructed Secondary Dressing: ABD Pad 5x9 (in/in) 1 x Per Day/30 Days Discharge Instructions: Cover with ABD pad Electronic Signature(s) Signed: 10/26/2022 1:44:04 PM By: Gretta Cool, BSN, RN, CWS, Kim RN, BSN Signed: 10/26/2022 2:37:27 PM By: [name] Keeler PA-C Entered By: Gretta Cool, BSN, RN, CWS, Kim on 10/26/2022 10:43:13 -------------------------------------------------------------------------------- Problem List Details Patient Name: Date of Service: [name] [place], [name]  [place]ERT [name] [name] 10/26/2022 10:00 [name] [name] Medical Record Number: [id number] Patient Account Number: [id number] Date of Birth/Sex: Treating RN: Dec 12, 1945 (77 y.o. [name] [name] [name], [name] [name] [name] Primary Care Provider: Lelon [name] Other Clinician: Referring Provider: Treating Provider/Extender: [name] [name] [name] in Treatment: 12 Active Problems ICD-10 Encounter Code Description Active Date MDM Diagnosis [phone] Chronic venous hypertension (idiopathic) with ulcer and inflammation of 08/02/2022 No Yes bilateral lower extremity L97.822 Non-pressure chronic ulcer of other part of left lower leg with fat layer 08/02/2022 No Yes exposed L97.812 Non-pressure chronic ulcer of other part of right lower leg with fat layer 08/02/2022 No Yes exposed I73.89 Other specified peripheral vascular diseases 08/02/2022 No Yes I48.0 Paroxysmal atrial fibrillation 08/02/2022 No Yes JAAZIEL, PEATROSS [name] [name] ([id number]) [id number] Page 6 of 9 I10 Essential (primary) hypertension 08/02/2022 No Yes Inactive Problems Resolved Problems Electronic Signature(s) Signed: 10/26/2022 10:00:22 AM By: [name] Keeler PA-C Entered By: [name] [name] [name] on 10/26/2022 10:00:22 -------------------------------------------------------------------------------- Progress Note Details Patient Name: Date of Service: [name] [name], [name] [name] [name]  10/26/2022 10:00 [name] [name] Medical Record Number: [id number] Patient Account Number: [id number] Date of Birth/Sex: Treating RN: 08-Aug-1946 (77 y.o. 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 the patient has had [name] vascular workup over 7 years ago and said everything was normal at that stage. He does not have any chronic problems except for cardiac issues which he sees [name] cardiologist in [place]. 08/15/2017 -- arterial and venous duplex studies still pending. 08/23/2017 -- venous reflux studies done on 08/13/2017 shows venous incompetence throughout the left lower extremity deep system and focally at the left saphenofemoral junction. 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 No evidence of SVT or DVT in bilateral lower extremities The patient has an appointment at the end of the month to get his arterial duplex study done 09/05/2017 -- the patient was seen at the vein and vascular office yesterday by [name] [name] [name] ABI studies were notable for medial calcification and the toe brachial indices were normal and bilateral ankle-brachial) waveforms were normal with triphasic flow. After review of his venous studies he was not [name] candidate for laser ablation and his lymphedema was to be treated with compression stockings and lymphedema pump pumps 09/12/2017 -- had [name]  low arterial study done at the [place] [facility] -- unable to obtain reliable ABI is due to medial calcification. Bilateral toe indices were normal with the right being 1.01 and the left being 0.92 and the waveforms were triphasic bilaterally. he did get hold of 30-40 mm compression stockings but is unable to put these on. We will try and get him alternative compression stockings. 09/26/17- he is here in follow up evaluation of [name] right lower extremity ulcer;he is compliant in wearing compression stocking; ulcer almost epithelialized , anticipate healing next appointment Readmission: 11/17 point upon evaluation patient's wound currently that he is seeing [place] for today is [name] skin cancerous lesion that was cleared away by his dermatologist on the left medial calf region. He tells me that this is [name] very  similar thing to what he had done previously in fact the last time he saw him in 2018 this was also what was going on at that point. Nonetheless he feels that based on what he seeing currently that this is just having [name] lot of harder time healing although it is much closer to the surface than what he is experienced in the past. He notes that the initial removal was in June 2022 which was this year this is now November and still has not closed. He does have some edema and definitely I think that there is some venous component to his slow healing here. Also think that we can do something better than Vaseline to try to help with getting this to clear up as quickly as possible. He does have [name] history of atrial fibrillation and is on Eliquis otherwise he really has no major medical problems that would affect wound healing. 09/07/2021 upon evaluation today patient actually appears to be doing significantly better after having wrapped him last week. Overall I think that this is making significant improvements at this time which is great news. I do not see any evidence of infection which is great news  as well. No fevers, chills, nausea, [name] [name] [name], [name] [name] [name] ([id number]) [id number] Page 7 of 9 vomiting, or diarrhea. 09/14/2021 upon evaluation today patient appears to be doing well with regard to his leg ulcer. He has been tolerating the dressing changes and overall I think that he is making excellent progress. I do not see any signs of active infection at this time. 09/21/2021 upon evaluation today patient actually appears to be making good progress with regard to his wound this is again measuring smaller today no debridement seems to be necessary. We have been using [name] silver collagen dressing and I think that is doing an awesome job. 09/28/2021 upon evaluation today patient appears to be doing well with regard to his leg currently. I do not see any signs of active infection at this time which is great news. No fevers, chills, nausea, vomiting, or diarrhea. I think this wound is very close to complete resolution. 10/12/2021 upon evaluation today patient actually appears to be doing awesome in regard to his leg ulcer. In fact this appears to be completely healed based on what I am seeing currently. I do not see any evidence of active infection locally nor systemically at this time which is also great news. No fevers, chills, nausea, vomiting, or diarrhea. Readmission: 12/07/2021 upon evaluation today patient presents for readmission here in the clinic. He was discharged on 10/12/2021 is completely healed. Unfortunately this has reopened at this point and he is having continual issues with new blisters over both lower extremities. This is even worse than what we previously saw. Nonetheless we did actually check his ABIs today and it did reveal that his ABIs were 0.55 on the left and 0.57 on the right. Subsequently this is [name] definite change from his last arterial study which showed that he did have good blood flow at 1.01 on the right and 0.92 on the left and that was right at the  beginning of 2019. Nonetheless based on what we see currently I do think he tolerated the 3 layer compression wrap but I do believe that we probably need to get him tested for his arterial flow in order to see where things stand and if there is something we can do there that would help prevent this from continue to be an ongoing issue. He did not  utilize compression socks in the interim from when he was last here till this time. That something is probably going to need lifelong going forward as well. 3/9; patient presents for follow-up. He has no issues or complaints today. He tolerated the compression wrap well. He had ABIs with TBI's done. He denies signs of infection. 12/21/2021 upon evaluation today patient appears to be doing well with regard to the wounds on his legs. Both are showing signs of significant improvement which is great news although I do believe some sharp debridement would be of benefit here as well. 12/28/2021 upon evaluation today patient appears to be doing well with regard to his wounds. Everything is showing signs of excellent improvement which I am very pleased about. I think that we are headed in the right direction here. Fortunately there does not appear to be any evidence of infection which is great news there is [name] little bit of hypergranulation. 01/04/2022 upon evaluation today patient appears to be doing well with regard to his wounds 2 of them are healed 1 is almost so and the other 1 is significantly better. Overall I am extremely pleased with where we stand and I think that he is making excellent progress here. I do not see any evidence of active infection locally nor systemically at this time. 01-16-2022 upon evaluation today patient's wound on the left leg is showing signs of doing quite well. Has not completely cleared at this point but it is much improved. Fortunately I do not see any signs of infection at this time. No fevers, chills, nausea, vomiting, or  diarrhea. 01-23-2022 upon evaluation today patient's wound of the left leg actually appears to be pretty much completely healed which is great news. I do not see any signs of active infection locally or systemically which is excellent. With that being said on the right leg what wound is measuring smaller the other 1 is [name] new wound that just showed up fortunately its not too bad. Has been using Xeroform here and that seems to be doing decently well which is great news. Unfortunately his blood pressure is significantly high we gave him the readings for the past 4-5 visits as well as [name] recommendation to make an appointment to go discuss this with his primary care provider patient states that he is going to look into doing this. 01-30-2022 upon evaluation today patient appears to be doing well with regard to his left leg everything appears to be healed. On the right leg the more anterior wound is healed the more medial wound that I been concerned about [name] possible skin cancer unfortunately still does not look great to me. I do believe that we should probably do [name] biopsy I have talked about it with him [name] few times I think though it is probably time to go ahead and do this at this point. 02-09-2022 upon evaluation today patient appears to be doing well with regard to his legs. On the left this appears to be completely healed. On the right he does have 2 areas and be perfectly honest one of them is [name] skin cancer that he is going to the Mohs surgery clinic for the other seems to be healing nicely. Readmission: 08-02-2022 upon evaluation today patient appears for reevaluation here in our clinic concerning issues that he has been having with wounds over the bilateral lower extremities. I last saw him in May 2023 and at that point we had him completely healed. Unfortunately he is tells me this  has broken down to some degree since that point. 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 He tells me this 1 seems to just be getting bigger and bigger not improving. Fortunately he is not having any significant pain which is good news he does have quite [name] bit of swelling and he tells me that his fluid pills are not recommended for him to take daily but just in 3-day intervals here and there. 08-09-2022 upon evaluation today patient appears to be doing still somewhat poorly in regard to his legs although in general he does not appear to be feeling as good as he has been. Fortunately there does not appear to be any signs of infection which is good news. With that being said he is having some issues here with having and overall poor feeling in general which again is good I think going to be the biggest complicating factor. He actually seems to be coughing I do not hear any wheezing right now I did listen to his chest he did not have good airflow down low however makes me suspicious for bronchitis or even possibly pneumonia which could be part of what is going on here as well. Fortunately I do not see any evidence of anything worsening in regard to his legs but I definitely believe that he needs to continue with the compression wraps he took them off yesterday to shower has not had anything on for 24 hours that is why his legs are so swollen today. With regard to his pathology report I did review that it showed some squamous abnormality but no signs of distinct carcinoma. With that being said it was saying that it could be adjacent to [name] squamous cell carcinoma nonetheless my suggestion is can be that we have the patient take copy of this report and give it to his Mohs surgeon in order for them to see if there is anything they feel like needs to be done further. With that being said right now I feel like the primary thing is going  to be for [place] to try to get his swelling down and keep that down into that hand since he is having so much drainage I believe we can have to bring him in for dressing changes twice [name] week doing [name] nurse visit on Mondays. 11/9; since the patient was last here he spent the night in the emergency room he received IV Lasix. Also received antibiotics although he was not discharged on either 1 of these. He also saw his cardiology office who put him on regular Lasix 20 mg [previously on as needed Lasix 20 mg]. Per our intake nurse the swelling in his legs is remarkably better but he still has bilateral lower extremity wounds. He still has wounds on the bilateral lower extremities most problematically on the left lateral calf. He has been using silver alginate under 3 layer compression. 08-23-2022 upon evaluation today patient appears to be doing much better than the last time I saw him 2 weeks ago. At that point I was very concerned about how he was doing he did see Dr. Caryn Section his primary care provider they got him on some blood pressure medication in general his color and overall appearance looks to be doing much improved compared to the  last time I saw him. 09-04-2022 upon evaluation today patient appears to be doing well currently in regard to his wounds. Everything is showing signs of improvement which is great news. Fortunately there does not appear to be any signs of active infection locally or systemically at this time. No fevers, chills, nausea, vomiting, or diarrhea. [name] [name] [name], [name] [name] [name] ([id number]) [id number] Page 8 of 9 09-10-2022 upon evaluation today patient appears to be doing better in regard to his wounds although the [facility] was extremely stuck to the wound bed. Fortunately there does not appear to be any signs of infection locally or systemically at this time which is great news. No fevers, chills, nausea, vomiting, or diarrhea. 09-17-2022 upon evaluation today  patient appears to be doing well currently in regard to his wounds in general. The right leg actually showing signs of excellent improvement and very pleased with where things stand in that regard. Fortunately I do not see any evidence of infection locally or systemically at this time which is great news. No fevers, chills, nausea, vomiting, or diarrhea. 09-24-2022 upon evaluation today patient appears to be doing well currently in regard to his wounds. Things look to be doing quite well. 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 He is seeing his dermatologist tomorrow in that regard. With that being said other than that however he seems to really be making some pretty good progress here which is good news. No fevers, chills, nausea, vomiting, or diarrhea. 12/26; the patient has 2 open wounds remaining on the left leg. One is on the left anterior mid tibia and the other is on the right lateral knee just outside of the popliteal fossa. 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 The patient has been to see dermatology Dr. Evorn Gong who apparently is making him [name] referral to the [facility] Mohs surgery center. He does not yet have an appointment 10-12-2022 upon evaluation today patient appears to be doing well currently in regard to his wound. He has been tolerating the dressing changes without complication and overall feel like we are headed in the right direction. Fortunately I do not see any signs of infection locally or systemically at this time which is great news. No fevers, chills, nausea, vomiting, or diarrhea. 10-23-2022 upon evaluation today patient appears to be doing well currently in regard to his wound. He has been tolerating the dressing changes without complication. Fortunately there does not appear to be any signs of active  infection locally nor systemically which is great news and overall I am extremely pleased with where we stand currently. No fevers, chills, nausea, vomiting, or diarrhea. 10-26-2022 upon evaluation today patient appears to be doing well currently in regard to his wounds. Everything is showing signs of improvement and this is great news. Fortunately I see no evidence of active infection systemically. He does seem to be doing much better in regard to the local infection in regard to his leg. The smell is also greatly improved. Overall I am extremely happy with where we stand today. This is after just [name] few days with the antibiotic on board. Objective Constitutional Well-nourished and well-hydrated in no acute distress. Vitals Time Taken: 10:05 AM, Height: 75 in, Weight: 225 lbs, BMI: 28.1, Temperature: 97.8 F, Pulse: 47 bpm, Respiratory Rate: 18 breaths/min, Blood Pressure: 160/71 mmHg. General Notes: PA  aware of heart rate 47. Patient has [name]-fib has fluctuations in rate. Respiratory normal breathing without difficulty. Psychiatric this patient is able to make decisions and demonstrates good insight into disease process. Alert and Oriented x 3. pleasant and cooperative. General Notes: Upon inspection patient's wound bed actually showed signs of excellent improvement pretty much across the board I feel like things are drying up quite nicely and the cellulitis seems to be improving. I am pleased with what we are seeing I think he is headed in the right direction here. Integumentary (Hair, Skin) Wound #15 status is Open. Original cause of wound was Gradually Appeared. The date acquired was: 08/28/2022. The wound has been in treatment 7 weeks. The wound is located on the Left,Posterior Lower Leg. The wound measures 0.5cm length x 0.5cm width x 0.1cm depth; 0.196cm^2 area and 0.02cm^3 volume. There is [name] medium amount of sanguinous drainage noted. Wound #19 status is Open. Original cause of wound was  Surgical Injury. The date acquired was: 10/19/2022. The wound is located on the Left,Medial Lower Leg. The wound measures 2.5cm length x 2.2cm width x 0.1cm depth; 4.32cm^2 area and 0.432cm^3 volume. Wound #9 status is Open. Original cause of wound was Gradually Appeared. The date acquired was: 01/20/2022. The wound has been in treatment 12 weeks. The wound is located on the Left,Lateral Lower Leg. The wound measures 3cm length x 2.5cm width x 0.1cm depth; 5.89cm^2 area and 0.589cm^3 volume. There is [name] large amount of serosanguineous drainage noted. Assessment Active Problems ICD-10 Chronic venous hypertension (idiopathic) with ulcer and inflammation of bilateral lower extremity Non-pressure chronic ulcer of other part of left lower leg with fat layer exposed Non-pressure chronic ulcer of other part of right lower leg with fat layer exposed Other specified peripheral vascular diseases Paroxysmal atrial fibrillation Essential (primary) hypertension [name] [name] [name], [name] [name] [name] ([id number]) [id number] Page 9 of 9 Plan 1. I would recommend that we have the patient going continue with the antibiotic which seems to be doing great for him currently. 2. I am also can recommend patient should continue with the compression wrapping. We have been doing [name] silver alginate dressing followed by [name] 3 layer compression wrap which does seem to be doing great for him at this point. 3. I am also can recommend the patient should continue to monitor for any signs of infection or worsening. Obviously if anything changes he knows contact the office and let me know. We will see patient back for reevaluation in 1 week here in the clinic. If anything worsens or changes patient will contact our office for additional recommendations. Electronic Signature(s) Signed: 10/26/2022 10:36:54 AM By: [name] Keeler PA-C Entered By: [name] [name] [name] on 10/26/2022  10:36:54 -------------------------------------------------------------------------------- SuperBill Details Patient Name: Date of Service: [name] [name], [name] [name] [name] 10/26/2022 Medical Record Number: [id number] Patient Account Number: [id number] Date of Birth/Sex: Treating RN: 02-Oct-1946 (77 y.o. [name] [name] [name], [name] [name] [name] Primary Care Provider: Lelon [name] Other Clinician: Referring Provider: Treating Provider/Extender: [name] [name] [name] in Treatment: 12 Diagnosis Coding ICD-10 Codes Code Description [phone] Chronic venous hypertension (idiopathic) with ulcer and inflammation of bilateral lower extremity L97.822 Non-pressure chronic ulcer of other part of left lower leg with fat layer exposed L97.812 Non-pressure chronic ulcer of other part of right lower leg with fat layer exposed I73.89 Other specified peripheral vascular diseases I48.0 Paroxysmal atrial fibrillation I10 Essential (primary) hypertension Facility Procedures : CPT4 Code: [id number] Description: (Facility Use Only) 29581LT - APPLY MULTLAY COMPRS LWR LT LEG Modifier: Quantity: 1  Physician Procedures : CPT4 Code Description Modifier [id number] - WC PHYS LEVEL 3 - EST PT ICD-10 Diagnosis Description 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 L97.822 Non-pressure chronic ulcer of other  part of left lower leg with fat layer exposed L97.812 Non-pressure chronic ulcer of other part of right lower leg with fat layer exposed I73.89 Other specified peripheral vascular diseases Quantity: 1 Electronic Signature(s) Signed: 10/26/2022 1:44:04 PM By: Gretta Cool, BSN, RN, CWS, Kim RN, BSN Signed: 10/26/2022 2:37:27 PM By: [name] Keeler PA-C Previous Signature: 10/26/2022 10:38:44 AM Version By: [name] Keeler PA-C Entered By: Gretta Cool, BSN, RN, CWS, Kim on 10/26/2022 10:44:57

## 2022-10-26 NOTE — Progress Notes (Addendum)
[name], [name] ([id number]) [id number] Page 1 of 12 Visit Report for 10/26/2022 Arrival Information Details Patient Name: Date of Service: [name] 10/26/2022 10:00 [name] Medical Record Number: [id number] Patient Account Number: [id number] Date of Birth/Sex: Treating RN: [birth year] (77 y.o. [name] Primary Care [name]: [name] Other Clinician: Referring [name]: Treating [name]/Extender: [name] in Treatment: 12 Visit Information History Since Last Visit Added or deleted any medications: No Patient Arrived: Ambulatory Has Compression in Place as Prescribed: Yes Arrival Time: 10:02 Pain Present Now: No Accompanied By: self Transfer Assistance: None Patient Identification Verified: Yes Secondary Verification Process Completed: Yes Patient Requires Transmission-Based No Precautions: Patient Has Alerts: Yes Patient Alerts: Patient on Blood Thinner 12/13/21 TBI [name]; NOT diabetic HISTORY OF SKIN CANCERS Electronic Signature(s) Signed: 10/26/2022 1:44:04 PM By: [name], BSN, RN, CWS, Kim RN, BSN Entered By: [name], BSN, RN, CWS, Kim on 10/26/2022 10:05:34 -------------------------------------------------------------------------------- Compression Therapy Details Patient Name: Date of Service: [name] [name], [name] 10/26/2022 10:00 [name] Medical Record Number: [id number] Patient Account Number: [id number] Date of Birth/Sex: Treating RN: [birth year] (77 y.o. [name] Primary Care [name]: [name] Other Clinician: Referring [name]: Treating [name]/Extender: [name] in Treatment: 12 Compression Therapy Performed for Wound Assessment: Wound #15 Left,Posterior Lower Leg Performed By: Clinician [name] Barman, RN Compression Type: Three Layer Post Procedure Diagnosis Same as Pre-procedure Notes [name], [name] ([id number]) [id number]  Page 2 of 12 Patient tolerating well. Electronic Signature(s) Signed: 10/26/2022 1:44:04 PM By: [name], BSN, RN, CWS, Kim RN, BSN Entered By: [name], BSN, RN, CWS, Kim on 10/26/2022 10:41:41 -------------------------------------------------------------------------------- Compression Therapy Details Patient Name: Date of Service: [name] [place], [name] 10/26/2022 10:00 [name] Medical Record Number: [id number] Patient Account Number: [id number] Date of Birth/Sex: Treating RN: [birth year] (77 y.o. [name] Primary Care [name]: [name] Other Clinician: Referring Aliviya Schoeller: Treating Mallie [name]/Extender: [name] in Treatment: 12 Compression Therapy Performed for Wound Assessment: Wound #19 Left,Medial Lower Leg Performed By: Clinician [name] Barman, RN Compression Type: Three Layer Post Procedure Diagnosis Same as Pre-procedure Notes Patient tolerating well. Electronic Signature(s) Signed: 10/26/2022 1:44:04 PM By: [name], BSN, RN, CWS, Kim RN, BSN Entered By: [name], BSN, RN, CWS, Kim on 10/26/2022 10:41:41 -------------------------------------------------------------------------------- Compression Therapy Details Patient Name: Date of Service: [name] [place], [name] 10/26/2022 10:00 [name] Medical Record Number: [id number] Patient Account Number: [id number] Date of Birth/Sex: Treating RN: [birth year] (77 y.o. [name] Primary Care [name]: [name] Other Clinician: Referring Kemiah Booz: Treating Siyon Linck/Extender: [name] in Treatment: 12 Compression Therapy Performed for Wound Assessment: Wound #9 Left,Lateral Lower Leg Performed By: Clinician [name] Barman, RN Compression Type: Three Layer Post Procedure Diagnosis Same as Pre-procedure Notes Patient tolerating well. [name], [name] ([id number]) [id number] Page 3 of 12 Electronic Signature(s) Signed: 10/26/2022 1:44:04 PM By: [name], BSN, RN, CWS, Kim RN,  BSN Entered By: [name], BSN, RN, CWS, Kim on 10/26/2022 10:41:41 -------------------------------------------------------------------------------- Encounter Discharge Information Details Patient Name: Date of Service: [name] [name], [name] 10/26/2022 10:00 [name] Medical Record Number: [id number] Patient Account Number: [id number] Date of Birth/Sex: Treating RN: Jan 19, 1946 (77 y.o. [name] Primary Care [name]: [name] Other Clinician: Referring [name]: Treating [name]/Extender: [name] in Treatment: 12 Encounter Discharge Information Items Discharge Condition: Stable Ambulatory Status: Ambulatory Discharge Destination: Home Transportation: Private Auto Schedule Follow-up Appointment: Yes Clinical Summary of  Care: Electronic Signature(s) Signed: 10/26/2022 1:44:04 PM By: [name], BSN, RN, CWS, Kim RN, BSN Entered By: [name], BSN, RN, CWS, Kim on 10/26/2022 10:47:43 -------------------------------------------------------------------------------- Lower Extremity Assessment Details Patient Name: Date of Service: [name] [name], [name] 10/26/2022 10:00 [name] Medical Record Number: [id number] Patient Account Number: [id number] Date of Birth/Sex: Treating RN: 05-Jan-1946 (77 y.o. [name] Primary Care [name]: [name] Other Clinician: Referring [name]: Treating [name]: [name] in Treatment: 12 Edema Assessment Assessed: [name]: No] [name]: No] [Left: Edema] [Right: :] Calf Left: Right: Point of Measurement: 34 cm From Medial Instep 38 cm Ankle Left: Right: Point of Measurement: 12 cm From Medial Instep 25 cm ABDULWAHAB, [name] [name] ([id number]) [id number] Page 4 of 12 Vascular Assessment Pulses: Dorsalis Pedis Palpable: [Left:Yes] Electronic Signature(s) Signed: 10/26/2022 1:44:04 PM By: [name], BSN, RN, CWS, Kim RN, BSN Entered By: [name], BSN, RN, CWS, Kim on 10/26/2022  78:29:56 -------------------------------------------------------------------------------- Multi Wound Chart Details Patient Name: Date of Service: [name] [place], [name] 10/26/2022 10:00 [name] Medical Record Number: [id number] Patient Account Number: [id number] Date of Birth/Sex: Treating RN: [birth year] (77 y.o. [name], [name] Primary Care Donal Lynam: [name] Other Clinician: Referring Shanina Kepple: Treating Haleigh Desmith/Extender: [name] in Treatment: 12 Vital Signs Height(in): 75 Pulse(bpm): 47 Weight(lbs): 225 Blood Pressure(mmHg): 160/71 Body Mass Index(BMI): 28.1 Temperature(F): 97.8 Respiratory Rate(breaths/min): 18 [15:Photos: No Photos Left, Posterior Lower Leg Wound Location: Gradually Appeared Wounding Event: Venous Leg Ulcer Primary Etiology: 08/28/2022 Date Acquired: 7 Weeks of Treatment: Open Wound Status: No Wound Recurrence: 0.5x0.5x0.1 Measurements L x W x D (cm)  0.196 [name] (cm) : rea 0.02 Volume (cm) : 93.10% % Reduction in [name] rea: 28.60% % Reduction in Volume: Full Thickness Without Exposed Classification: Support Structures Medium Exudate Amount: Sanguinous Exudate Type: red Exudate Color:] [19:No Photos Left,  Medial Lower Leg Surgical Injury Atypical 10/19/2022 0 Open No 2.5x2.2x0.1 4.32 0.432 32.40% 32.40% N/[name] N/[name] N/[name] N/[name]] Treatment Notes Electronic Signature(s) Signed: 10/26/2022 1:44:04 PM By: [name], BSN, RN, CWS, Kim RN, BSN Entered By: [name], BSN, RN, CWS, Kim on 10/26/2022 10:40:59 [name] ([id number]) [id number] Page 5 of 12 -------------------------------------------------------------------------------- Multi-Disciplinary Care Plan Details Patient Name: Date of Service: [name] 10/26/2022 10:00 [name] Medical Record Number: [id number] Patient Account Number: [id number] Date of Birth/Sex: Treating RN: 28-May-1946 (78 y.o. [name] Primary Care Sayvon Arterberry: [name] Other  Clinician: Referring Kasia Trego: Treating Lilliemae Fruge/Extender: [name] in Treatment: 12 Active Inactive Necrotic Tissue Nursing Diagnoses: Impaired tissue integrity related to necrotic/devitalized tissue Knowledge deficit related to management of necrotic/devitalized tissue Goals: Necrotic/devitalized tissue will be minimized in the wound bed Date Initiated: 09/04/2022 Target Resolution Date: 10/26/2022 Goal Status: Active Patient/caregiver will verbalize understanding of reason and process for debridement of necrotic tissue Date Initiated: 09/04/2022 Date Inactivated: 09/10/2022 Target Resolution Date: 09/27/2022 Goal Status: Met Interventions: Assess patient pain level pre-, during and post procedure and prior to discharge Provide education on necrotic tissue and debridement process Treatment Activities: Apply topical anesthetic as ordered : 09/04/2022 Excisional debridement : 09/04/2022 Notes: Venous Leg Ulcer Nursing Diagnoses: Actual venous Insuffiency (use after diagnosis is confirmed) Knowledge deficit related to disease process and management Potential for venous Insuffiency (use before diagnosis confirmed) Goals: Non-invasive venous studies are completed as ordered Date Initiated: 09/04/2022 Date Inactivated: 10/26/2022 Target Resolution Date: 09/03/2022 Goal Status: Met Patient will maintain optimal edema control Date Initiated: 09/04/2022 Target Resolution Date: 10/26/2022 Goal Status:  Active Patient/caregiver will verbalize understanding of disease process and disease management Date Initiated: 09/04/2022 Target Resolution Date: 10/26/2022 Goal Status: Active Verify adequate tissue perfusion prior to therapeutic compression application Date Initiated: 09/04/2022 Target Resolution Date: 10/26/2022 Goal Status: Active Interventions: Assess peripheral edema status every visit. Compression as ordered Provide education on venous  insufficiency Treatment Activities: Therapeutic compression applied : 09/04/2022 [name], [name] ([id number]) [id number] Page 6 of 12 Notes: Wound/Skin Impairment Nursing Diagnoses: Impaired tissue integrity Knowledge deficit related to smoking impact on wound healing Knowledge deficit related to ulceration/compromised skin integrity Goals: Patient/caregiver will verbalize understanding of skin care regimen Date Initiated: 09/04/2022 Date Inactivated: 09/10/2022 Target Resolution Date: 10/04/2022 Goal Status: Met Ulcer/skin breakdown will have [name] volume reduction of 30% by week 4 Date Initiated: 09/04/2022 Date Inactivated: 09/10/2022 Target Resolution Date: 10/04/2022 Goal Status: Unmet Unmet Reason: comorbidities Ulcer/skin breakdown will have [name] volume reduction of 50% by week 8 Date Initiated: 09/04/2022 Target Resolution Date: 11/04/2022 Goal Status: Active Ulcer/skin breakdown will have [name] volume reduction of 80% by week 12 Date Initiated: 09/04/2022 Target Resolution Date: 12/05/2022 Goal Status: Active Ulcer/skin breakdown will heal within 14 weeks Date Initiated: 09/04/2022 Target Resolution Date: 12/12/2022 Goal Status: Active Interventions: Assess patient/caregiver ability to obtain necessary supplies Assess patient/caregiver ability to perform ulcer/skin care regimen upon admission and as needed Assess ulceration(s) every visit Provide education on ulcer and skin care Notes: Electronic Signature(s) Signed: 10/26/2022 1:44:04 PM By: [name], BSN, RN, CWS, Kim RN, BSN Entered By: [name], BSN, RN, CWS, Kim on 10/26/2022 10:47:09 -------------------------------------------------------------------------------- Pain Assessment Details Patient Name: Date of Service: [name] [place], [name] 10/26/2022 10:00 [name] Medical Record Number: [id number] Patient Account Number: [id number] Date of Birth/Sex: Treating RN: 03/01/1946 (77 y.o. [name] Primary  Care Khari Lett: [name] Other Clinician: Referring Gisella Alwine: Treating Shoshanah Dapper/Extender: [name] in Treatment: 12 Active Problems Location of Pain Severity and Description of Pain Patient Has Paino No Site Locations [place], [place] ([id number]) [phone].pdf Page 7 of 12 Pain Management and Medication Current Pain Management: Electronic Signature(s) Signed: 10/26/2022 1:44:04 PM By: [name], BSN, RN, CWS, Kim RN, BSN Entered By: [name], BSN, RN, CWS, Kim on 10/26/2022 10:06:40 -------------------------------------------------------------------------------- Patient/Caregiver Education Details Patient Name: Date of Service: [name] [name] 1/19/2024andnbsp10:00 [name] Medical Record Number: [id number] Patient Account Number: [id number] Date of Birth/Gender: Treating RN: 28-Feb-1946 (77 y.o. [name] Primary Care Physician: [name] Other Clinician: Referring Physician: Treating Physician/Extender: [name] in Treatment: 12 Education Assessment Education Provided To: Patient Education Topics Provided Medication Safety: Handouts: Other: continue antibiotics Methods: Explain/Verbal Responses: State content correctly Venous: Handouts: Controlling Swelling with Multilayered Compression Wraps, Other: wound dressing as prescribed Methods: Demonstration, Explain/Verbal Responses: State content correctly Electronic Signature(s) Signed: 10/26/2022 1:44:04 PM By: [name], BSN, RN, CWS, Kim RN, BSN Entered By: [name], BSN, RN, CWS, Kim on 10/26/2022 10:45:49 [name] ([id number]) [id number] Page 8 of 12 -------------------------------------------------------------------------------- Wound Assessment Details Patient Name: Date of Service: [name] 10/26/2022 10:00 [name] Medical Record Number: [id number] Patient Account Number: [id number] Date of Birth/Sex:  Treating RN: [birth year] (77 y.o. [name] Primary Care Clayden Withem: [name] Other Clinician: Referring Anette Barra: Treating Nimesh Riolo/Extender: [name] in Treatment: 12 Wound Status Wound Number: 15 Primary Etiology: Venous Leg Ulcer Wound Location: Left, Posterior Lower Leg Wound Status: Open Wounding Event: Gradually Appeared Comorbid History: Arrhythmia, Hypertension, Gout Date Acquired: 08/28/2022 Weeks Of Treatment:  7 Clustered Wound: No Photos Wound Measurements Length: (cm) 0.5 Width: (cm) 0.5 Depth: (cm) 0.1 Area: (cm) 0.196 Volume: (cm) 0.02 % Reduction in Area: 93.1% % Reduction in Volume: 28.6% Epithelialization: Small (1-33%) Tunneling: No Undermining: No Wound Description Classification: Full Thickness Without Exposed Suppor Exudate Amount: Medium Exudate Type: Sanguinous Exudate Color: red t Structures Wound Bed Granulation Amount: Small (1-33%) Exposed Structure Granulation Quality: Red, Pink Fascia Exposed: No Necrotic Amount: Small (1-33%) Fat Layer (Subcutaneous Tissue) Exposed: Yes Necrotic Quality: Adherent Slough Tendon Exposed: No Muscle Exposed: No Joint Exposed: No Bone Exposed: No Treatment Notes Wound #15 (Lower Leg) Wound Laterality: Left, Posterior Cleanser Peri-Wound Care Topical [name], [name] [id number] Page 9 of 12 Primary Dressing Silvercel Small 2x2 (in/in) Discharge Instruction: Apply Silvercel Small 2x2 (in/in) as instructed Secondary Dressing ABD Pad 5x9 (in/in) Discharge Instruction: Cover with ABD pad Secured With Compression Wrap Compression Stockings Add-Ons Electronic Signature(s) Signed: 10/26/2022 10:55:10 AM By: [name], BSN, RN, CWS, Kim RN, BSN Entered By: [name], BSN, RN, CWS, Kim on 10/26/2022 10:55:09 -------------------------------------------------------------------------------- Wound Assessment Details Patient Name: Date of  Service: [name] [place], [name] 10/26/2022 10:00 [name] Medical Record Number: [id number] Patient Account Number: [id number] Date of Birth/Sex: Treating RN: [birth year] (77 y.o. [name], [name] Primary Care Tannya Gonet: [name] Other Clinician: Referring Jeremie Giangrande: Treating Kapena Hamme/Extender: [name] in Treatment: 12 Wound Status Wound Number: 19 Primary Etiology: Malignant Wound Wound Location: Left, Medial Lower Leg Wound Status: Open Wounding Event: Surgical Injury Comorbid History: Arrhythmia, Hypertension, Gout Date Acquired: 10/19/2022 Weeks Of Treatment: 0 Clustered Wound: No Photos Wound Measurements Length: (cm) 2.5 Width: (cm) 2.2 Depth: (cm) 0.1 Area: (cm) 4.32 Volume: (cm) 0.432 % Reduction in Area: 32.4% % Reduction in Volume: 32.4% Wound Description Classification: Full Thickness Without Exposed Support Structures Wound Margin: Flat and Intact [name], [name] ([id number]) Exudate Amount: Large Exudate Type: Serosanguineous Exudate Color: red, brown Foul Odor After Cleansing: No Slough/Fibrino Yes [phone].pdf Page 10 of 12 Wound Bed Granulation Amount: None Present (0%) Exposed Structure Necrotic Amount: Large (67-100%) Fascia Exposed: No Necrotic Quality: Adherent Slough Fat Layer (Subcutaneous Tissue) Exposed: Yes Tendon Exposed: No Muscle Exposed: No Joint Exposed: No Bone Exposed: No Treatment Notes Wound #19 (Lower Leg) Wound Laterality: Left, Medial Cleanser Peri-Wound Care Topical Primary Dressing Silvercel Small 2x2 (in/in) Discharge Instruction: Apply Silvercel Small 2x2 (in/in) as instructed Secondary Dressing ABD Pad 5x9 (in/in) Discharge Instruction: Cover with ABD pad Secured With Compression Wrap Compression Stockings Add-Ons Electronic Signature(s) Signed: 10/26/2022 10:54:09 AM By: [name], BSN, RN, CWS, Kim RN, BSN Entered By: [name], BSN, RN, CWS, Kim on 10/26/2022  10:54:09 -------------------------------------------------------------------------------- Wound Assessment Details Patient Name: Date of Service: [name] [place], [name] 10/26/2022 10:00 [name] Medical Record Number: [id number] Patient Account Number: [id number] Date of Birth/Sex: Treating RN: 1946/09/30 (77 y.o. [name] Primary Care Porsha Skilton: [name] Other Clinician: Referring Sparkles Mcneely: Treating Ellianne Gowen/Extender: [name] in Treatment: 12 Wound Status Wound Number: 9 Primary Etiology: Venous Leg Ulcer Wound Location: Left, Lateral Lower Leg Wound Status: Open Wounding Event: Gradually Appeared Comorbid History: Arrhythmia, Hypertension, Gout Date Acquired: 01/20/2022 Weeks Of Treatment: 12 Clustered Wound: No Photos [name], [name] ([id number]) [id number] Page 11 of 12 Wound Measurements Length: (cm) 3 Width: (cm) 2.5 Depth: (cm) 0.1 Area: (cm) 5.89 Volume: (cm) 0.589 % Reduction in Area: 66.5% % Reduction in Volume: 83.2% Wound Description Classification: Full Thickness Without Exposed Suppor Exudate Amount: Large Exudate Type: Serosanguineous  Exudate Color: red, brown t Structures Slough/Fibrino Yes Wound Bed Granulation Amount: Small (1-33%) Exposed Structure Granulation Quality: Red Fascia Exposed: No Necrotic Amount: Medium (34-66%) Fat Layer (Subcutaneous Tissue) Exposed: Yes Necrotic Quality: Adherent Slough Tendon Exposed: No Muscle Exposed: No Joint Exposed: No Bone Exposed: No Treatment Notes Wound #9 (Lower Leg) Wound Laterality: Left, Lateral Cleanser Peri-Wound Care Topical Primary Dressing Silvercel Small 2x2 (in/in) Discharge Instruction: Apply Silvercel Small 2x2 (in/in) as instructed Secondary Dressing ABD Pad 5x9 (in/in) Discharge Instruction: Cover with ABD pad Secured With Compression Wrap Compression Stockings Add-Ons Electronic Signature(s) Signed: 10/26/2022 10:55:51 AM  By: [name], BSN, RN, CWS, Kim RN, BSN Entered By: [name], BSN, RN, CWS, Kim on 10/26/2022 10:55:50 [name], [name] ([id number]) [id number] Page 12 of 12 -------------------------------------------------------------------------------- Vitals Details Patient Name: Date of Service: [name] 10/26/2022 10:00 [name] Medical Record Number: [id number] Patient Account Number: [id number] Date of Birth/Sex: Treating RN: 1946-07-31 (77 y.o. [name] Primary Care Farrah Skoda: [name] Other Clinician: Referring Jael Waldorf: Treating Jeweline Reif/Extender: [name] in Treatment: 12 Vital Signs Time Taken: 10:05 Temperature (F): 97.8 Height (in): 75 Pulse (bpm): 47 Weight (lbs): 225 Respiratory Rate (breaths/min): 18 Body Mass Index (BMI): 28.1 Blood Pressure (mmHg): 160/71 Reference Range: 80 - 120 mg / dl Notes PA aware of heart rate 47. Patient has [name]-fib has fluctuations in rate. Electronic Signature(s) Signed: 10/26/2022 1:44:04 PM By: [name], BSN, RN, CWS, Kim RN, BSN Entered By: [name], BSN, RN, CWS, Kim on 10/26/2022 30:16:01

## 2022-11-01 ENCOUNTER — Other Ambulatory Visit: Payer: Self-pay | Admitting: Cardiology

## 2022-11-01 DIAGNOSIS — I87333 Chronic venous hypertension (idiopathic) with ulcer and inflammation of bilateral lower extremity: Secondary | ICD-10-CM | POA: Diagnosis not present

## 2022-11-01 DIAGNOSIS — L97812 Non-pressure chronic ulcer of other part of right lower leg with fat layer exposed: Secondary | ICD-10-CM | POA: Diagnosis not present

## 2022-11-01 DIAGNOSIS — I10 Essential (primary) hypertension: Secondary | ICD-10-CM

## 2022-11-01 DIAGNOSIS — Z85828 Personal history of other malignant neoplasm of skin: Secondary | ICD-10-CM | POA: Diagnosis not present

## 2022-11-01 DIAGNOSIS — L97822 Non-pressure chronic ulcer of other part of left lower leg with fat layer exposed: Secondary | ICD-10-CM | POA: Diagnosis not present

## 2022-11-01 DIAGNOSIS — I7389 Other specified peripheral vascular diseases: Secondary | ICD-10-CM | POA: Diagnosis not present

## 2022-11-01 DIAGNOSIS — I428 Other cardiomyopathies: Secondary | ICD-10-CM | POA: Diagnosis not present

## 2022-11-01 DIAGNOSIS — I480 Paroxysmal atrial fibrillation: Secondary | ICD-10-CM | POA: Diagnosis not present

## 2022-11-01 DIAGNOSIS — Z7901 Long term (current) use of anticoagulants: Secondary | ICD-10-CM | POA: Diagnosis not present

## 2022-11-01 NOTE — Progress Notes (Addendum)
AKSH, SWART [name] ([id number]) [id number] Page 1 of 6 Visit Report for 11/01/2022 Arrival Information Details Patient Name: Date of Service: [name] 11/01/2022 4:15 PM Medical Record Number: [id number] Patient Account Number: [id number] Date of Birth/Sex: Treating RN: 05-01-1946 (77 y.o. [name] Primary Care Merina Behrendt: [name] Other Clinician: Referring Ardelia Wrede: Treating Bridie Colquhoun/Extender: Verdia Kuba in Treatment: 13 Visit Information History Since Last Visit Added or deleted any medications: No Patient Arrived: Ambulatory Any new allergies or adverse reactions: No Arrival Time: 16:09 Had [name] fall or experienced change in No Accompanied By: self activities of daily living that may affect Transfer Assistance: None risk of falls: Patient Identification Verified: Yes Hospitalized since last visit: No Secondary Verification Process Completed: Yes Has Dressing in Place as Prescribed: Yes Patient Requires Transmission-Based No Pain Present Now: No Precautions: Patient Has Alerts: Yes Patient Alerts: Patient on Blood Thinner 12/13/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 11/01/2022 4:15 PM Medical Record Number: [id number] Patient Account Number: [id number] Date of Birth/Sex: Treating RN: 1946-09-22 (77 y.o. [name] Primary Care [place] Luppino: [name] Other Clinician: Referring [place]: Treating Ellianna Ruest/Extender: [name], MICHA EL Annye English in Treatment: 13 Compression Therapy Performed for Wound  Assessment: Wound #15 Left,Posterior Lower Leg Performed By: Clinician [name], RN Compression Type: Three Hydrologist) Signed: 11/02/2022 12:44:53 PM By: [name] MSN RN CNS WTA Entered By: [name] on 11/02/2022 12:40:11 [name], [name] ([id number]) [id number] Page 2 of 6 -------------------------------------------------------------------------------- Compression Therapy Details Patient Name: Date of Service: [name] 11/01/2022 4:15 PM Medical Record Number: [id number] Patient Account Number: [id number] Date of Birth/Sex: Treating RN: 11-Jun-1946 (77 y.o. [name] Primary Care Cruze Zingaro: [name] Other Clinician: Referring Mrytle Bento: Treating Tyrice Hewitt/Extender: [name], MICHA EL Annye English in Treatment: 13 Compression Therapy Performed for Wound Assessment: Wound #19 Left,Medial Lower Leg Performed By: Clinician [name], RN Compression Type: Three Hydrologist) Signed: 11/02/2022 12:44:53 PM By: [name] MSN RN CNS WTA Entered By: [name] on 11/02/2022 12:40:11 -------------------------------------------------------------------------------- Compression Therapy Details Patient Name: Date of Service: [name] [place]. 11/01/2022 4:15 PM Medical Record Number: [id number] Patient Account Number: [id number] Date of Birth/Sex: Treating RN: [birth year] (77 y.o. [name] Primary Care Shaindel Sweeten: [name] Other Clinician: Referring Chloie Loney: Treating Dannya Pitkin/Extender: [name], MICHA EL Annye English in Treatment: 13 Compression Therapy Performed for Wound Assessment: Wound #9 Left,Lateral Lower Leg Performed By: Clinician [name], RN Compression Type: Three Hydrologist) Signed: 11/02/2022 12:44:53 PM By: [name] MSN RN CNS WTA Entered By: [name] on 11/02/2022 12:40:11 [name], [name] ([id number])  [id number] Page 3 of 6 -------------------------------------------------------------------------------- Encounter Discharge Information Details Patient Name: Date of Service: [name] 11/01/2022 4:15 PM Medical Record Number: [id number] Patient Account Number: [id number] Date of Birth/Sex: Treating RN: [birth year] (77 y.o. [name] Primary Care Tayten Heber: [name] Other Clinician: Referring Debarah Mccumbers: Treating Eliakim Tendler/Extender: [name], MICHA EL Annye English in Treatment: 13 Encounter Discharge Information Items Discharge Condition: Stable Ambulatory Status: Ambulatory Discharge Destination: Home Transportation: Private Auto Accompanied  By: self Schedule Follow-up Appointment: No Clinical Summary of Care: Electronic Signature(s) Signed: 11/02/2022 12:44:53 PM By: [name] MSN RN CNS WTA Entered By: [name] on 11/02/2022 12:40:54 -------------------------------------------------------------------------------- Wound Assessment Details Patient Name: Date of Service: [name] [place] 11/01/2022 4:15 PM Medical Record Number: [id number] Patient Account Number: [id number] Date of Birth/Sex: Treating RN: 29-Jun-1946 (77 y.o. [name] Primary Care Velera Lansdale: [name] Other Clinician: Referring Saheed Carrington: Treating Jeniya Flannigan/Extender: Eldridge Dace, MICHA EL Annye English in Treatment: 13 Wound Status Wound Number: 15 Primary Etiology: Venous Leg Ulcer Wound Location: Left, Posterior Lower Leg Wound Status: Open Wounding Event: Gradually Appeared Comorbid History: Arrhythmia, Hypertension, Gout Date Acquired: 08/28/2022 Weeks Of Treatment: 8 Clustered Wound: No Wound Measurements Length: (cm) 0.5 Width: (cm) 0.5 Depth: (cm) 0.1 Area: (cm) 0.196 Volume: (cm) 0.02 % Reduction in Area: 93.1% % Reduction in Volume: 28.6% Epithelialization: Small (1-33%) Wound Description Classification: Full  Thickness Without Exposed Suppor Exudate Amount: Medium Exudate Type: Sanguinous Exudate Color: red t Structures Wound Bed Granulation Amount: Small (1-33%) Exposed Structure Granulation Quality: Red, Pink Fascia Exposed: No Necrotic Amount: Small (1-33%) Fat Layer (Subcutaneous Tissue) Exposed: Yes Necrotic Quality: Adherent Slough Tendon Exposed: No Muscle Exposed: No Joint Exposed: No [name], [name] ([id number]) [id number] Page 4 of 6 Bone Exposed: No Treatment Notes Wound #15 (Lower Leg) Wound Laterality: Left, Posterior Cleanser Peri-Wound Care Topical Primary Dressing Silvercel Small 2x2 (in/in) Discharge Instruction: Apply Silvercel Small 2x2 (in/in) as instructed Secondary Dressing ABD Pad 5x9 (in/in) Discharge Instruction: Cover with ABD pad Secured With Compression Wrap Compression Stockings Add-Ons Electronic Signature(s) Signed: 11/02/2022 12:44:53 PM By: [name] MSN RN CNS WTA Entered By: [name] on 11/02/2022 12:39:30 -------------------------------------------------------------------------------- Wound Assessment Details Patient Name: Date of Service: [name] [name] Scot [name] 11/01/2022 4:15 PM Medical Record Number: [id number] Patient Account Number: [id number] Date of Birth/Sex: Treating RN: 27-Sep-1946 (77 y.o. [name] Primary Care Cliffie Gingras: [name] Other Clinician: Referring Almer Bushey: Treating Yolani Vo/Extender: Eldridge Dace, MICHA EL Annye English in Treatment: 13 Wound Status Wound Number: 19 Primary Etiology: Malignant Wound Wound Location: Left, Medial Lower Leg Wound Status: Open Wounding Event: Surgical Injury Comorbid History: Arrhythmia, Hypertension, Gout Date Acquired: 10/19/2022 Weeks Of Treatment: 1 Clustered Wound: No Wound Measurements Length: (cm) 2.5 Width: (cm) 2.2 Depth: (cm) 0.1 Area: (cm) 4.32 Volume: (cm) 0.432 % Reduction in Area: 32.4% % Reduction in Volume:  32.4% Wound Description Classification: Full Thickness Without Exposed Support Structures Wound Margin: Flat and Intact Exudate Amount: Large Exudate Type: Serosanguineous Exudate Color: red, brown RENNY, REMER [name] ([id number]) Foul Odor After Cleansing: No Slough/Fibrino Yes [phone].pdf Page 5 of 6 Wound Bed Granulation Amount: None Present (0%) Exposed Structure Necrotic Amount: Large (67-100%) Fascia Exposed: No Necrotic Quality: Adherent Slough Fat Layer (Subcutaneous Tissue) Exposed: Yes Tendon Exposed: No Muscle Exposed: No Joint Exposed: No Bone Exposed: No Treatment Notes Wound #19 (Lower Leg) Wound Laterality: Left, Medial Cleanser Peri-Wound Care Topical Primary Dressing Silvercel Small 2x2 (in/in) Discharge Instruction: Apply Silvercel Small 2x2 (in/in) as instructed Secondary Dressing ABD Pad 5x9 (in/in) Discharge Instruction: Cover with ABD pad Secured With Compression Wrap Compression Stockings Add-Ons Electronic Signature(s) Signed: 11/02/2022 12:44:53 PM By: [name] MSN RN CNS WTA Entered By: [name] on 11/02/2022 12:39:36 -------------------------------------------------------------------------------- Wound Assessment Details Patient Name: Date of Service: [name] [name], [name] 11/01/2022 4:15 PM Medical Record Number: [id number] Patient Account Number: [id number] Date of Birth/Sex: Treating RN: 23-Apr-1946 (76  y.o. [name] Primary Care Casimer Russett: [name] Other Clinician: Referring Mariha Sleeper: Treating Saleh Ulbrich/Extender: Eldridge Dace, MICHA EL Annye English in Treatment: 13 Wound Status Wound Number: 9 Primary Etiology: Venous Leg Ulcer Wound Location: Left, Lateral Lower Leg Wound Status: Open Wounding Event: Gradually Appeared Comorbid History: Arrhythmia, Hypertension, Gout Date Acquired: 01/20/2022 Weeks Of Treatment: 13 Clustered Wound: No Wound Measurements Length: (cm) 3 Width: (cm)  2.5 Depth: (cm) 0.1 Area: (cm) 5.89 Volume: (cm) 0.589 Schwartzman, Vernis [name] ([id number]) % Reduction in Area: 66.5% % Reduction in Volume: 83.2% [id number] Page 6 of 6 Wound Description Classification: Full Thickness Without Exposed Suppor Exudate Amount: Large Exudate Type: Serosanguineous Exudate Color: red, brown t Structures Slough/Fibrino Yes Wound Bed Granulation Amount: Small (1-33%) Exposed Structure Granulation Quality: Red Fascia Exposed: No Necrotic Amount: Medium (34-66%) Fat Layer (Subcutaneous Tissue) Exposed: Yes Necrotic Quality: Adherent Slough Tendon Exposed: No Muscle Exposed: No Joint Exposed: No Bone Exposed: No Treatment Notes Wound #9 (Lower Leg) Wound Laterality: Left, Lateral Cleanser Peri-Wound Care Topical Primary Dressing Silvercel Small 2x2 (in/in) Discharge Instruction: Apply Silvercel Small 2x2 (in/in) as instructed Secondary Dressing ABD Pad 5x9 (in/in) Discharge Instruction: Cover with ABD pad Secured With Compression Wrap Compression Stockings Add-Ons Electronic Signature(s) Signed: 11/02/2022 12:44:53 PM By: [name] MSN RN CNS WTA Entered By: [name] on 11/02/2022 12:39:43

## 2022-11-05 ENCOUNTER — Encounter: Payer: Medicare PPO | Admitting: Physician Assistant

## 2022-11-05 DIAGNOSIS — I428 Other cardiomyopathies: Secondary | ICD-10-CM | POA: Diagnosis not present

## 2022-11-05 DIAGNOSIS — T8189XA Other complications of procedures, not elsewhere classified, initial encounter: Secondary | ICD-10-CM | POA: Diagnosis not present

## 2022-11-05 DIAGNOSIS — L97812 Non-pressure chronic ulcer of other part of right lower leg with fat layer exposed: Secondary | ICD-10-CM | POA: Diagnosis not present

## 2022-11-05 DIAGNOSIS — I7389 Other specified peripheral vascular diseases: Secondary | ICD-10-CM | POA: Diagnosis not present

## 2022-11-05 DIAGNOSIS — I872 Venous insufficiency (chronic) (peripheral): Secondary | ICD-10-CM | POA: Diagnosis not present

## 2022-11-05 DIAGNOSIS — I87333 Chronic venous hypertension (idiopathic) with ulcer and inflammation of bilateral lower extremity: Secondary | ICD-10-CM | POA: Diagnosis not present

## 2022-11-05 DIAGNOSIS — L97822 Non-pressure chronic ulcer of other part of left lower leg with fat layer exposed: Secondary | ICD-10-CM | POA: Diagnosis not present

## 2022-11-05 DIAGNOSIS — I480 Paroxysmal atrial fibrillation: Secondary | ICD-10-CM | POA: Diagnosis not present

## 2022-11-05 DIAGNOSIS — I87332 Chronic venous hypertension (idiopathic) with ulcer and inflammation of left lower extremity: Secondary | ICD-10-CM | POA: Diagnosis not present

## 2022-11-05 DIAGNOSIS — Z7901 Long term (current) use of anticoagulants: Secondary | ICD-10-CM | POA: Diagnosis not present

## 2022-11-05 DIAGNOSIS — I10 Essential (primary) hypertension: Secondary | ICD-10-CM | POA: Diagnosis not present

## 2022-11-05 DIAGNOSIS — Z85828 Personal history of other malignant neoplasm of skin: Secondary | ICD-10-CM | POA: Diagnosis not present

## 2022-11-05 NOTE — Progress Notes (Signed)
KODI, STEIL R ([id number]) [id number] Page 1 of 2 Visit Report for 11/05/2022 Chief Complaint Document Details Patient Name: Date of Service: [name] [name] 11/05/2022 9:45 [name] M Medical Record Number: [id number] Patient Account Number: [id number] Date of Birth/Sex: Treating RN: 05/02/1946 (77 y.o. [name] Primary Care Provider: [name] Other Clinician: Referring Provider: Treating Provider/Extender: [name] in Treatment: 13 Information Obtained from: Patient Chief Complaint Bilateral LE Ulcers Electronic Signature(s) Signed: 11/05/2022 9:21:17 AM By: [name] PA-C Entered By: [name] on 11/05/2022 09:21:17 -------------------------------------------------------------------------------- Problem List Details Patient Name: Date of Service: [name], [name] LBERT R. 11/05/2022 9:45 [name] M Medical Record Number: [id number] Patient Account Number: [id number] Date of Birth/Sex: Treating RN: 10-Dec-1945 (77 y.o. [name], [name] Primary Care Provider: [name] Other Clinician: Referring Provider: Treating Provider/Extender: [name] in Treatment: 13 Active Problems ICD-10 Encounter Code Description Active Date MDM Diagnosis 2104519298 Chronic venous hypertension (idiopathic) with ulcer and inflammation of 08/02/2022 No Yes bilateral lower extremity L97.822 Non-pressure chronic ulcer of other part of left lower leg with fat layer 08/02/2022 No Yes exposed L97.812 Non-pressure chronic ulcer of other part of right lower leg with fat layer 08/02/2022 No Yes exposed ANDREUS, CURE ([id number]) [id number] Page 2 of 2 I73.89 Other specified peripheral vascular diseases 08/02/2022 No Yes I48.0 Paroxysmal atrial fibrillation 08/02/2022 No Yes I10 Essential (primary) hypertension 08/02/2022 No Yes Inactive Problems Resolved Problems Electronic  Signature(s) Signed: 11/05/2022 9:21:14 AM By: [name] PA-C Entered By: [name] on 11/05/2022 09:21:14

## 2022-11-05 NOTE — Progress Notes (Addendum)
TRUMAN, ACEITUNO [name] ([id number]) [id number] Page 1 of 10 Visit Report for 11/05/2022 Arrival Information Details Patient Name: Date of Service: [name] 11/05/2022 9:45 [name] Medical Record Number: [id number] Patient Account Number: [id number] Date of Birth/Sex: Treating RN: [birth year] (77 y.o. [name]: [name] Other Clinician: Referring [name]: Treating [name]/Extender: [name] in Treatment: 21 Visit Information History Since Last Visit Added or deleted any medications: No Patient Arrived: Ambulatory Has Dressing in Place as Prescribed: Yes Arrival Time: 09:27 Has Compression in Place as Prescribed: Yes Transfer Assistance: None Pain Present Now: No Patient Identification Verified: Yes Secondary Verification Process Completed: Yes Patient Requires Transmission-Based No Precautions: Patient Has Alerts: Yes Patient Alerts: Patient on Blood Thinner 12/13/21 TBI [name]; NOT diabetic HISTORY OF SKIN CANCERS Electronic Signature(s) Signed: 11/07/2022 7:35:08 PM By: [name], BSN, RN, CWS, Kim RN, BSN Entered By: [name], BSN, RN, CWS, [name] on 11/05/2022 90:24:09 -------------------------------------------------------------------------------- Encounter Discharge Information Details Patient Name: Date of Service: [name] [name], [name] 11/05/2022 9:45 [name] Medical Record Number: [id number] Patient Account Number: [id number] Date of Birth/Sex: Treating RN: [birth year] (77 y.o. [name] Primary Care [name]: [name] Other Clinician: Referring [name]: Treating [name]/Extender: [name] in Treatment: 13 Encounter Discharge Information Items Post Procedure Vitals Discharge Condition: Stable Temperature (F): 98.2 Ambulatory Status: Ambulatory Pulse (bpm): 45 Discharge Destination: Home Respiratory Rate (breaths/min): 16 Transportation: Private  Auto Blood Pressure (mmHg): 127/52 Accompanied By: self Schedule Follow-up Appointment: Yes Clinical Summary of Care: [name], [name] ([id number]) [id number] Page 2 of 10 Electronic Signature(s) Signed: 11/07/2022 7:35:08 PM By: [name], BSN, RN, CWS, Kim RN, BSN Entered By: [name], BSN, RN, CWS, [name] on 11/05/2022 10:30:40 -------------------------------------------------------------------------------- Lower Extremity Assessment Details Patient Name: Date of Service: [name] [name], [name] 11/05/2022 9:45 [name] Medical Record Number: [id number] Patient Account Number: [id number] Date of Birth/Sex: Treating RN: 1946-06-07 (77 y.o. [name] Primary Care Arrion [name]: [name] Other Clinician: Referring [name]: Treating Aki Abalos/Extender: [name] in Treatment: 13 Edema Assessment Assessed: [name]: No] [name]: No] [Left: Edema] [Right: :] Calf Left: Right: Point of Measurement: 34 cm From Medial Instep 36 cm Ankle Left: Right: Point of Measurement: 12 cm From Medial Instep 25 cm Vascular Assessment Pulses: Dorsalis Pedis Palpable: [Left:Yes] Electronic Signature(s) Signed: 11/07/2022 7:35:08 PM By: [name], BSN, RN, CWS, Kim RN, BSN Entered By: [name], BSN, RN, CWS, [name] on 11/05/2022 09:56:07 -------------------------------------------------------------------------------- Multi Wound Chart Details Patient Name: Date of Service: [name] [name], [name] 11/05/2022 9:45 [name] Medical Record Number: [id number] Patient Account Number: [id number] Date of Birth/Sex: Treating RN: 02-Sep-1946 (77 y.o. [name] Primary Care [name]: [name] Other Clinician: Referring [name]: Treating [name]/Extender: [name] in Treatment: [address] [place] ([id number]) [id number] Page 3 of 10 Vital Signs Height(in): 75 Pulse(bpm): 45 Weight(lbs): 225 Blood Pressure(mmHg): 127/52 Body Mass  Index(BMI): 28.1 Temperature(F): 98.2 Respiratory Rate(breaths/min): 16 [15:Photos: No Photos] Left, Posterior Lower Leg Left, Medial Lower Leg Left, Lateral Lower Leg Wound Location: Gradually Appeared Surgical Injury Gradually Appeared Wounding Event: 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 Primary Etiology: [name] Arrhythmia, Hypertension, Gout Arrhythmia, Hypertension, Gout Comorbid History: 08/28/2022 10/19/2022 01/20/2022 Date Acquired: '8 1 13 '$ Weeks of Treatment: Healed - Epithelialized Open Open Wound Status: No No No Wound Recurrence: 0x0x0 2.5x2.2x0.1 1.5x1x0.1 Measurements L x W x D (cm) 0 4.32 1.178 [name] (cm) :  rea 0 0.432 0.118 Volume (cm) : 100.00% 32.40% 93.30% % Reduction in [name] rea: 100.00% 32.40% 96.60% % Reduction in Volume: Full Thickness Without Exposed Full Thickness Without Exposed Full Thickness Without Exposed Classification: Support Structures Support Structures Support Structures Medium Large Medium Exudate [name] mount: Sanguinous Serosanguineous Serosanguineous Exudate Type: red red, brown red, brown Exudate Color: [name] Flat and Intact [name] Wound Margin: [name] Medium (34-66%) Small (1-33%) Granulation [name] mount: [name] Red Red Granulation Quality: [name] Medium (34-66%) Medium (34-66%) Necrotic [name] mount: [name] None [name] Epithelialization: Treatment Notes Electronic Signature(s) Signed: 11/07/2022 7:35:08 PM By: [name], BSN, RN, CWS, Kim RN, BSN Entered By: [name], BSN, RN, CWS, [name] on 11/05/2022 10:10:23 -------------------------------------------------------------------------------- [place] Details Patient Name: Date of Service: [name] [name], [name] 11/05/2022 9:45 [name] Medical Record Number: [id number] Patient Account Number: [id number] Date of Birth/Sex: Treating RN: [birth year] (77 y.o. [name] Primary Care Jossue Rubenstein: [name] Other Clinician: Referring [name] Christenbury: Treating Spring San/Extender: [name] in Treatment: [address] [place], [place] [name] ([id number]) [id number] Page 4 of 10 Necrotic Tissue Nursing Diagnoses: Impaired tissue integrity related to necrotic/devitalized tissue Knowledge deficit related to management of necrotic/devitalized tissue Goals: Necrotic/devitalized tissue will be minimized in the wound bed Date Initiated: 09/04/2022 Target Resolution Date: 11/12/2022 Goal Status: Active Patient/caregiver will verbalize understanding of reason and process for debridement of necrotic tissue Date Initiated: 09/04/2022 Date Inactivated: 09/10/2022 Target Resolution Date: 09/27/2022 Goal Status: Met Interventions: Assess patient pain level pre-, during and post procedure and prior to discharge Provide education on necrotic tissue and debridement process Treatment Activities: Apply topical anesthetic as ordered : 09/04/2022 Excisional debridement : 09/04/2022 Notes: Venous Leg Ulcer Nursing Diagnoses: Actual venous Insuffiency (use after diagnosis is confirmed) Knowledge deficit related to disease process and management Potential for venous Insuffiency (use before diagnosis confirmed) Goals: Non-invasive venous studies are completed as ordered Date Initiated: 09/04/2022 Date Inactivated: 10/26/2022 Target Resolution Date: 09/03/2022 Goal Status: Met Patient will maintain optimal edema control Date Initiated: 09/04/2022 Target Resolution Date: 11/12/2022 Goal Status: Active Patient/caregiver will verbalize understanding of disease process and disease management Date Initiated: 09/04/2022 Target Resolution Date: 11/12/2022 Goal Status: Active Verify adequate tissue perfusion prior to therapeutic compression application Date Initiated: 09/04/2022 Target Resolution Date: 11/12/2022 Goal Status: Active Interventions: Assess peripheral edema status every visit. Compression as ordered Provide education on venous  insufficiency Treatment Activities: Therapeutic compression applied : 09/04/2022 Notes: Wound/Skin Impairment Nursing Diagnoses: Impaired tissue integrity Knowledge deficit related to smoking impact on wound healing Knowledge deficit related to ulceration/compromised skin integrity Goals: Patient/caregiver will verbalize understanding of skin care regimen Date Initiated: 09/04/2022 Date Inactivated: 09/10/2022 Target Resolution Date: 10/04/2022 Goal Status: Met Ulcer/skin breakdown will have [name] volume reduction of 30% by week 4 Date Initiated: 09/04/2022 Date Inactivated: 09/10/2022 Target Resolution Date: 10/04/2022 Goal Status: Unmet Unmet Reason: comorbidities Ulcer/skin breakdown will have [name] volume reduction of 50% by week 8 Date Initiated: 09/04/2022 Date Inactivated: 11/05/2022 Target Resolution Date: 11/04/2022 Goal Status: Met Ulcer/skin breakdown will have [name] volume reduction of 80% by week 12 Date Initiated: 09/04/2022 Target Resolution Date: 12/05/2022 Goal Status: Active Ulcer/skin breakdown will heal within 14 weeks [name], [name] ([id number]) [id number] Page 5 of 10 Date Initiated: 09/04/2022 Target Resolution Date: 12/12/2022 Goal Status: Active Interventions: Assess patient/caregiver ability to obtain necessary supplies Assess patient/caregiver ability to perform ulcer/skin care regimen upon admission and as needed Assess ulceration(s) every visit Provide education on ulcer and skin care Notes: Electronic Signature(s) Signed: 11/07/2022  7:35:08 PM By: [name], BSN, RN, CWS, Kim RN, BSN Entered By: [name], BSN, RN, CWS, [name] on 11/05/2022 10:29:46 -------------------------------------------------------------------------------- Pain Assessment Details Patient Name: Date of Service: [name] [place], [name] 11/05/2022 9:45 [name] Medical Record Number: [id number] Patient Account Number: [id number] Date of Birth/Sex: Treating RN: November 30, 1945 (77 y.o.  [name] Primary Care Corynn Solberg: [name] Other Clinician: Referring Sevana Grandinetti: Treating Chiana Wamser/Extender: [name] in Treatment: 13 Active Problems Location of Pain Severity and Description of Pain Patient Has Paino No Site Locations Pain Management and Medication Current Pain Management: Notes knee pain Electronic Signature(s) Signed: 11/07/2022 7:35:08 PM By: [name], BSN, RN, CWS, Kim RN, BSN Entered By: [name], BSN, RN, CWS, [name] on 11/05/2022 10:15:23 [name] ([id number]) [id number] Page 6 of 10 -------------------------------------------------------------------------------- Patient/Caregiver Education Details Patient Name: Date of Service: [name] 1/29/2024andnbsp9:45 [name] Medical Record Number: [id number] Patient Account Number: [id number] Date of Birth/Gender: Treating RN: [birth year] (77 y.o. [name] Primary Care Physician: [name] Other Clinician: Referring Physician: Treating Physician/Extender: [name] in Treatment: 13 Education Assessment Education Provided To: Patient Education Topics Provided Venous: Handouts: Controlling Swelling with Multilayered Compression Wraps Methods: Demonstration, Explain/Verbal Responses: State content correctly Electronic Signature(s) Signed: 11/07/2022 7:35:08 PM By: [name], BSN, RN, CWS, Kim RN, BSN Entered By: [name], BSN, RN, CWS, [name] on 11/05/2022 10:28:51 -------------------------------------------------------------------------------- Wound Assessment Details Patient Name: Date of Service: [name] [place], [name] 11/05/2022 9:45 [name] Medical Record Number: [id number] Patient Account Number: [id number] Date of Birth/Sex: Treating RN: 07/15/1946 (77 y.o. [name] Primary Care Wylene Weissman: [name] Other Clinician: Referring Gumecindo Hopkin: Treating Jabori Henegar/Extender: [name] in Treatment:  13 Wound Status Wound Number: 15 Primary Etiology: Venous Leg Ulcer Wound Location: Left, Posterior Lower Leg Wound Status: Healed - Epithelialized Wounding Event: Gradually Appeared Date Acquired: 08/28/2022 Weeks Of Treatment: 8 Clustered Wound: No Photos [name], [name] ([id number]) [phone].pdf Page 7 of 10 Photo Uploaded By: [name], BSN, RN, CWS, [name] on 11/05/2022 10:15:06 Wound Measurements Length: (cm) Width: (cm) Depth: (cm) Area: (cm) Volume: (cm) 0 % Reduction in Area: 100% 0 % Reduction in Volume: 100% 0 0 0 Wound Description Classification: Full Thickness Without Exposed Support Exudate Amount: Medium Exudate Type: Sanguinous Exudate Color: red Structures Treatment Notes Wound #15 (Lower Leg) Wound Laterality: Left, Posterior Cleanser Peri-Wound Care Topical Primary Dressing Secondary Dressing Secured With Compression Wrap Compression Stockings Add-Ons Electronic Signature(s) Signed: 11/07/2022 7:35:08 PM By: [name], BSN, RN, CWS, Kim RN, BSN Entered By: [name], BSN, RN, CWS, [name] on 11/05/2022 09:51:18 -------------------------------------------------------------------------------- Wound Assessment Details Patient Name: Date of Service: [name] [place], [name] 11/05/2022 9:45 [name] Medical Record Number: [id number] Patient Account Number: [id number] Date of Birth/Sex: Treating RN: [birth year] (77 y.o. [name] Primary Care Marguita Venning: [name] Other Clinician: Referring Herny Scurlock: Treating Sheyna Pettibone/Extender: [name] in Treatment: [address] [name], [name] ([id number]) [id number] Page 8 of 10 Wound Number: 19 Primary Etiology: Malignant Wound Wound Location: Left, Medial Lower Leg Wound Status: Open Wounding Event: Surgical Injury Comorbid History: Arrhythmia, Hypertension, Gout Date Acquired: 10/19/2022 Weeks Of Treatment: 1 Clustered Wound: No Photos Wound  Measurements Length: (cm) 2.5 Width: (cm) 2.2 Depth: (cm) 0.1 Area: (cm) 4.32 Volume: (cm) 0.432 % Reduction in Area: 32.4% % Reduction in Volume: 32.4% Epithelialization: None Tunneling: No Undermining: No Wound Description Classification: Full Thickness Without Exposed Suppor Wound Margin: Flat and Intact  Exudate Amount: Large Exudate Type: Serosanguineous Exudate Color: red, brown t Structures Foul Odor After Cleansing: No Slough/Fibrino Yes Wound Bed Granulation Amount: Medium (34-66%) Exposed Structure Granulation Quality: Red Fascia Exposed: No Necrotic Amount: Medium (34-66%) Fat Layer (Subcutaneous Tissue) Exposed: Yes Necrotic Quality: Adherent Slough Tendon Exposed: No Muscle Exposed: No Joint Exposed: No Bone Exposed: No Treatment Notes Wound #19 (Lower Leg) Wound Laterality: Left, Medial Cleanser Peri-Wound Care Topical Primary Dressing Hydrofera Blue Ready Transfer Foam, 2.5x2.5 (in/in) Discharge Instruction: Apply Hydrofera Blue Ready to wound bed as directed Secondary Dressing ABD Pad 5x9 (in/in) Discharge Instruction: Cover with ABD pad Secured With Compression Wrap 3-LAYER WRAP - Profore Lite LF 3 Multilayer Compression Bandaging System Discharge Instruction: Apply 3 multi-layer wrap as prescribed. Compression Stockings Environmental education officer) Signed: 11/07/2022 7:35:08 PM By: [name], BSN, RN, CWS, Kim RN, BSN Entered By: [name], BSN, RN, CWS, [name] on 11/05/2022 09:52:55 [name], [name] ([id number]) [id number] Page 9 of 10 -------------------------------------------------------------------------------- Wound Assessment Details Patient Name: Date of Service: [name] [name] Scot [name] 11/05/2022 9:45 [name] Medical Record Number: [id number] Patient Account Number: [id number] Date of Birth/Sex: Treating RN: [birth year] (77 y.o. [name], [name] Primary Care Kesia Dalto: [name] Other Clinician: Referring  Emillie Chasen: Treating Raisa Ditto/Extender: [name] in Treatment: 13 Wound Status Wound Number: 9 Primary Etiology: Venous Leg Ulcer Wound Location: Left, Lateral Lower Leg Wound Status: Open Wounding Event: Gradually Appeared Comorbid History: Arrhythmia, Hypertension, Gout Date Acquired: 01/20/2022 Weeks Of Treatment: 13 Clustered Wound: No Photos Wound Measurements Length: (cm) 1.5 Width: (cm) 1 Depth: (cm) 0.1 Area: (cm) 1.178 Volume: (cm) 0.118 % Reduction in Area: 93.3% % Reduction in Volume: 96.6% Tunneling: No Undermining: No Wound Description Classification: Full Thickness Without Exposed Suppor Exudate Amount: Medium Exudate Type: Serosanguineous Exudate Color: red, brown t Structures Slough/Fibrino Yes Wound Bed Granulation Amount: Small (1-33%) Exposed Structure Granulation Quality: Red Fascia Exposed: No Necrotic Amount: Medium (34-66%) Fat Layer (Subcutaneous Tissue) Exposed: Yes Necrotic Quality: Adherent Slough Tendon Exposed: No Muscle Exposed: No Joint Exposed: No Bone Exposed: No Treatment Notes Wound #9 (Lower Leg) Wound Laterality: Left, Lateral Cleanser Peri-Wound Care SHADMAN, [name] ([id number]) [id number] Page 10 of 10 Topical Primary Dressing Hydrofera Blue Ready Transfer Foam, 2.5x2.5 (in/in) Discharge Instruction: Apply Hydrofera Blue Ready to wound bed as directed Secondary Dressing ABD Pad 5x9 (in/in) Discharge Instruction: Cover with ABD pad Secured With Compression Wrap 3-LAYER WRAP - Profore Lite LF 3 Multilayer Compression Bandaging System Discharge Instruction: Apply 3 multi-layer wrap as prescribed. Compression Stockings Environmental education officer) Signed: 11/07/2022 7:35:08 PM By: [name], BSN, RN, CWS, Kim RN, BSN Entered By: [name], BSN, RN, CWS, [name] on 11/05/2022 09:54:05 -------------------------------------------------------------------------------- Vitals  Details Patient Name: Date of Service: [name] [name], [name] 11/05/2022 9:45 [name] Medical Record Number: [id number] Patient Account Number: [id number] Date of Birth/Sex: Treating RN: 02-15-1946 (77 y.o. [name] Primary Care Susette Seminara: [name] Other Clinician: Referring Abdulkadir Emmanuel: Treating Mashal Slavick/Extender: [name] in Treatment: 13 Vital Signs Time Taken: 09:25 Temperature (F): 98.2 Height (in): 75 Pulse (bpm): 45 Weight (lbs): 225 Respiratory Rate (breaths/min): 16 Body Mass Index (BMI): 28.1 Blood Pressure (mmHg): 127/52 Reference Range: 80 - 120 mg / dl Electronic Signature(s) Signed: 11/07/2022 7:35:08 PM By: [name], BSN, RN, CWS, Kim RN, BSN Entered By: [name], BSN, RN, CWS, [name] on 11/05/2022 09:55:52

## 2022-11-08 DIAGNOSIS — M7022 Olecranon bursitis, left elbow: Secondary | ICD-10-CM | POA: Diagnosis not present

## 2022-11-09 NOTE — Progress Notes (Signed)
LELDON, STEEGE R ([id number]) [id number] Page 1 of 2 Visit Report for 11/01/2022 Physician Orders Details Patient Name: Date of Service: [name] 11/01/2022 4:15 PM Medical Record Number: [id number] Patient Account Number: [id number] Date of Birth/Sex: Treating RN: May 26, 1946 (77 y.o. [name] Primary Care Provider: [name] Other Clinician: Referring Provider: Treating Provider/Extender: [name], [place] EL Annye English in Treatment: [phone] Verbal / Phone Orders: No Diagnosis Coding Follow-up Appointments Return Appointment in 1 week. Nurse Visit as needed Bathing/ Shower/ Hygiene May shower with wound dressing protected with water repellent cover or cast protector. No tub bath. Anesthetic (Use 'Patient Medications' Section for Anesthetic Order Entry) Wound #9 Left,Lateral Lower Leg Lidocaine applied to wound bed Edema Control - Lymphedema / Segmental Compressive Device / Other Bilateral Lower Extremities 3 Layer Compression System for Lymphedema. - left lower leg Patient to wear own compression stockings. Remove compression stockings every night before going to bed and put on every morning when getting up. - right lower leg Elevate, Exercise Daily and A void Standing for Long Periods of Time. Elevate legs to the level of the heart and pump ankles as often as possible Elevate leg(s) parallel to the floor when sitting. DO YOUR BEST to sleep in the bed at night. DO NOT sleep in your recliner. Long hours of sitting in a recliner leads to swelling of the legs and/or potential wounds on your backside. Additional Orders / Instructions Follow Nutritious Diet and Increase Protein Intake Medications-Please add to medication list. ntibiotics - Continue antibiotics as prescribed until completed. P.O. A Wound Treatment Wound #15 - Lower Leg Wound Laterality: Left, Posterior Prim Dressing: Silvercel Small 2x2 (in/in) 1 x Per Day/30  Days ary Discharge Instructions: Apply Silvercel Small 2x2 (in/in) as instructed Secondary Dressing: ABD Pad 5x9 (in/in) 1 x Per Day/30 Days Discharge Instructions: Cover with ABD pad Wound #19 - Lower Leg Wound Laterality: Left, Medial Prim Dressing: Silvercel Small 2x2 (in/in) 1 x Per Day/30 Days ary Discharge Instructions: Apply Silvercel Small 2x2 (in/in) as instructed Secondary Dressing: ABD Pad 5x9 (in/in) 1 x Per Day/30 Days Discharge Instructions: Cover with ABD pad Wound #9 - Lower Leg Wound Laterality: Left, Lateral Prim Dressing: Silvercel Small 2x2 (in/in) 1 x Per Day/30 Days ary Discharge Instructions: Apply Silvercel Small 2x2 (in/in) as instructed Secondary Dressing: ABD Pad 5x9 (in/in) 1 x Per Day/30 Days SALMAN, WELLEN ([id number]) [id number] Page 2 of 2 Discharge Instructions: Cover with ABD pad Electronic Signature(s) Signed: 11/02/2022 12:44:53 PM By: [name] MSN RN CNS WTA Signed: 11/08/2022 6:01:55 PM By: Linton Ham MD Entered By: [name] on 11/02/2022 12:40:31 -------------------------------------------------------------------------------- SuperBill Details Patient Name: Date of Service: [name] [name] Scot R. 11/01/2022 Medical Record Number: [id number] Patient Account Number: [id number] Date of Birth/Sex: Treating RN: Feb 17, 1946 (77 y.o. [name] Primary Care Provider: [name] Other Clinician: Referring Provider: Treating Provider/Extender: Eldridge Dace, MICHA EL Annye English in Treatment: 13 Diagnosis Coding ICD-10 Codes Code Description [phone] Chronic venous hypertension (idiopathic) with ulcer and inflammation of bilateral lower extremity L97.822 Non-pressure chronic ulcer of other part of left lower leg with fat layer exposed L97.812 Non-pressure chronic ulcer of other part of right lower leg with fat layer exposed I73.89 Other specified peripheral vascular diseases I48.0 Paroxysmal atrial  fibrillation I10 Essential (primary) hypertension Facility Procedures : CPT4 Code: 51700174 Description: (Facility Use Only) 29581LT - APPLY MULTLAY COMPRS LWR LT LEG Modifier: Quantity: 1 Electronic Signature(s) Signed:  11/02/2022 12:44:53 PM By: [name] MSN RN CNS WTA Signed: 11/08/2022 6:01:55 PM By: Linton Ham MD Entered By: [name] on 11/02/2022 12:41:20

## 2022-11-12 ENCOUNTER — Encounter: Payer: Medicare PPO | Attending: Physician Assistant | Admitting: Physician Assistant

## 2022-11-12 DIAGNOSIS — C4922 Malignant neoplasm of connective and soft tissue of left lower limb, including hip: Secondary | ICD-10-CM | POA: Diagnosis not present

## 2022-11-12 DIAGNOSIS — I739 Peripheral vascular disease, unspecified: Secondary | ICD-10-CM | POA: Insufficient documentation

## 2022-11-12 DIAGNOSIS — I10 Essential (primary) hypertension: Secondary | ICD-10-CM | POA: Insufficient documentation

## 2022-11-12 DIAGNOSIS — I480 Paroxysmal atrial fibrillation: Secondary | ICD-10-CM | POA: Insufficient documentation

## 2022-11-12 DIAGNOSIS — I87333 Chronic venous hypertension (idiopathic) with ulcer and inflammation of bilateral lower extremity: Secondary | ICD-10-CM | POA: Diagnosis not present

## 2022-11-12 DIAGNOSIS — L97812 Non-pressure chronic ulcer of other part of right lower leg with fat layer exposed: Secondary | ICD-10-CM | POA: Diagnosis not present

## 2022-11-12 DIAGNOSIS — Z7901 Long term (current) use of anticoagulants: Secondary | ICD-10-CM | POA: Diagnosis not present

## 2022-11-12 DIAGNOSIS — L97822 Non-pressure chronic ulcer of other part of left lower leg with fat layer exposed: Secondary | ICD-10-CM | POA: Diagnosis not present

## 2022-11-12 DIAGNOSIS — I872 Venous insufficiency (chronic) (peripheral): Secondary | ICD-10-CM | POA: Insufficient documentation

## 2022-11-12 DIAGNOSIS — I428 Other cardiomyopathies: Secondary | ICD-10-CM | POA: Diagnosis not present

## 2022-11-13 NOTE — Progress Notes (Signed)
[name], [name] ([id number]) [id number] Page 1 of 12 Visit Report for 11/12/2022 Arrival Information Details Patient Name: Date of Service: [name] 11/12/2022 7:45 A M Medical Record Number: [id number] Patient Account Number: [id number] Date of Birth/Sex: Treating RN: 1945/11/11 (77 y.o. [name] Primary Care Roxi Hlavaty: [name] Other Clinician: Referring Dajohn Ellender: Treating Andrue Dini/Extender: [name] in Treatment: 30 Visit Information History Since Last Visit Added or deleted any medications: No Patient Arrived: Ambulatory Any new allergies or adverse reactions: No Arrival Time: 07:43 Had a fall or experienced change in No Accompanied By: self activities of daily living that may affect Transfer Assistance: None risk of falls: Patient Requires Transmission-Based No Signs or symptoms of abuse/neglect since last visito No Precautions: Hospitalized since last visit: No Patient Has Alerts: Yes Implantable device outside of the clinic excluding No Patient Alerts: Patient on Blood Thinner cellular tissue based products placed in the center 12/13/21 TBI [name])0.75 L)0.72 since last visit: Xarelto; NOT diabetic Has Dressing in Place as Prescribed: Yes HISTORY OF SKIN CANCERS Has Compression in Place as Prescribed: Yes Pain Present Now: No Electronic Signature(s) Signed: 11/12/2022 4:47:59 PM By: [name] MSN RN CNS WTA Entered By: [name] on 11/12/2022 07:45:42 -------------------------------------------------------------------------------- Clinic Level of Care Assessment Details Patient Name: Date of Service: [name] [facility] [name] 11/12/2022 7:45 A M Medical Record Number: [id number] Patient Account Number: [id number] Date of Birth/Sex: Treating RN: [birth year] (77 y.o. [name] Primary Care Kanoelani Dobies: [name] Other Clinician: Referring Alyia Lacerte: Treating Kalii Chesmore/Extender: [name] in Treatment: 14 Clinic Level of Care Assessment Items TOOL 4 Quantity Score '[]'$  - 0 Use when only an EandM is performed on FOLLOW-UP visit ASSESSMENTS - Nursing Assessment / Reassessment '[]'$  - 0 Reassessment of Co-morbidities (includes updates in patient status) '[]'$  - 0 Reassessment of Adherence to Treatment Plan [name], [name] ([id number]) [id number] Page 2 of 12 ASSESSMENTS - Wound and Skin A ssessment / Reassessment '[]'$  - 0 Simple Wound Assessment / Reassessment - one wound '[]'$  - 0 Complex Wound Assessment / Reassessment - multiple wounds '[]'$  - 0 Dermatologic / Skin Assessment (not related to wound area) ASSESSMENTS - Focused Assessment '[]'$  - 0 Circumferential Edema Measurements - multi extremities '[]'$  - 0 Nutritional Assessment / Counseling / Intervention '[]'$  - 0 Lower Extremity Assessment (monofilament, tuning fork, pulses) '[]'$  - 0 Peripheral Arterial Disease Assessment (using hand held doppler) ASSESSMENTS - Ostomy and/or Continence Assessment and Care '[]'$  - 0 Incontinence Assessment and Management '[]'$  - 0 Ostomy Care Assessment and Management (repouching, etc.) PROCESS - Coordination of Care '[]'$  - 0 Simple Patient / Family Education for ongoing care '[]'$  - 0 Complex (extensive) Patient / Family Education for ongoing care '[]'$  - 0 Staff obtains Programmer, systems, Records, T Results / Process Orders est '[]'$  - 0 Staff telephones HHA, Nursing Homes / Clarify orders / etc '[]'$  - 0 Routine Transfer to another Facility (non-emergent condition) '[]'$  - 0 Routine Hospital Admission (non-emergent condition) '[]'$  - 0 New Admissions / Biomedical engineer / Ordering NPWT Apligraf, etc. , '[]'$  - 0 Emergency Hospital Admission (emergent condition) '[]'$  - 0 Simple Discharge Coordination '[]'$  - 0 Complex (extensive) Discharge Coordination PROCESS - Special Needs '[]'$  - 0 Pediatric / Minor Patient Management '[]'$  - 0 Isolation Patient Management '[]'$  - 0 Hearing /  Language / Visual special needs '[]'$  - 0 Assessment of Community assistance (transportation, [name], etc.) '[]'$  - 0 Additional assistance / Altered mentation '[]'$  -  0 Support Surface(s) Assessment (bed, cushion, seat, etc.) INTERVENTIONS - Wound Cleansing / Measurement '[]'$  - 0 Simple Wound Cleansing - one wound '[]'$  - 0 Complex Wound Cleansing - multiple wounds '[]'$  - 0 Wound Imaging (photographs - any number of wounds) '[]'$  - 0 Wound Tracing (instead of photographs) '[]'$  - 0 Simple Wound Measurement - one wound '[]'$  - 0 Complex Wound Measurement - multiple wounds INTERVENTIONS - Wound Dressings '[]'$  - 0 Small Wound Dressing one or multiple wounds '[]'$  - 0 Medium Wound Dressing one or multiple wounds '[]'$  - 0 Large Wound Dressing one or multiple wounds '[]'$  - 0 Application of Medications - topical '[]'$  - 0 Application of Medications - injection INTERVENTIONS - Miscellaneous '[]'$  - 0 External ear exam [name], [name] ([id number]) [id number] Page 3 of 12 '[]'$  - 0 Specimen Collection (cultures, biopsies, blood, body fluids, etc.) '[]'$  - 0 Specimen(s) / Culture(s) sent or taken to Lab for analysis '[]'$  - 0 Patient Transfer (multiple staff / Harrel Lemon Lift / Similar devices) '[]'$  - 0 Simple Staple / Suture removal (25 or less) '[]'$  - 0 Complex Staple / Suture removal (26 or more) '[]'$  - 0 Hypo / Hyperglycemic Management (close monitor of Blood Glucose) '[]'$  - 0 Ankle / Brachial Index (ABI) - do not check if billed separately '[]'$  - 0 Vital Signs Has the patient been seen at the hospital within the last three years: Yes Total Score: 0 Level Of Care: ____ Electronic Signature(s) Signed: 11/12/2022 4:47:59 PM By: [name] MSN RN CNS WTA Entered By: [name] on 11/12/2022 08:21:20 -------------------------------------------------------------------------------- Compression Therapy Details Patient Name: Date of Service: [name], [name] 11/12/2022 7:45 A M Medical Record  Number: [id number] Patient Account Number: [id number] Date of Birth/Sex: Treating RN: [birth year] (77 y.o. [name] Primary Care Tannis Burstein: [name] Other Clinician: Referring Aileana Hodder: Treating Estefania Kamiya/Extender: [name] in Treatment: 14 Compression Therapy Performed for Wound Assessment: Wound #19 Left,Medial Lower Leg Performed By: Clinician [name], RN Compression Type: Three Layer Post Procedure Diagnosis Same as Pre-procedure Electronic Signature(s) Signed: 11/12/2022 4:47:59 PM By: [name] MSN RN CNS WTA Entered By: [name] on 11/12/2022 08:36:59 -------------------------------------------------------------------------------- Compression Therapy Details Patient Name: Date of Service: [name], [name] 11/12/2022 7:45 A M Medical Record Number: [id number] Patient Account Number: [id number] Date of Birth/Sex: Treating RN: [birth year] (77 y.o. Ithiel, [place], [place] [name] ([id number]) [id number] Page 4 of 12 Primary Care Sheriden Archibeque: [name] Other Clinician: Referring Garcia Dalzell: Treating Bethany Cumming/Extender: [name] in Treatment: 14 Compression Therapy Performed for Wound Assessment: Wound #9 Left,Lateral Lower Leg Performed By: Clinician [name], RN Compression Type: Three Layer Post Procedure Diagnosis Same as Pre-procedure Electronic Signature(s) Signed: 11/12/2022 4:47:59 PM By: [name] MSN RN CNS WTA Entered By: [name] on 11/12/2022 08:36:59 -------------------------------------------------------------------------------- Encounter Discharge Information Details Patient Name: Date of Service: [name], [name] 11/12/2022 7:45 A M Medical Record Number: [id number] Patient Account Number: [id number] Date of Birth/Sex: Treating RN: [birth year] (78 y.o. [name] Primary Care Landrum Carbonell: [name] Other Clinician: Referring Adarrius Graeff: Treating  Jvon Meroney/Extender: [name] in Treatment: 14 Encounter Discharge Information Items Post Procedure Vitals Discharge Condition: Stable Temperature (F): 97.3 Ambulatory Status: Ambulatory Pulse (bpm): 45 Discharge Destination: Home Respiratory Rate (breaths/min): 16 Transportation: Private Auto Blood Pressure (mmHg): 147/64 Accompanied By: self Schedule Follow-up Appointment: Yes Clinical Summary of Care: Electronic Signature(s) Signed: 11/12/2022 4:47:59 PM By: [name] MSN  RN CNS WTA Previous Signature: 11/12/2022 8:11:02 AM Version By: [name] MSN RN CNS WTA Entered By: [name] on 11/12/2022 08:38:41 -------------------------------------------------------------------------------- Lower Extremity Assessment Details Patient Name: Date of Service: [name] [name] 11/12/2022 7:45 A M Medical Record Number: [id number] Patient Account Number: [id number] Date of Birth/Sex: Treating RN: 1945/10/30 (77 y.o. [name] Primary Care Tykeria Wawrzyniak: [name] Other Clinician: Referring Circe Chilton: Treating Intisar Claudio/Extender: [name] in Treatment: [address], [place] ([id number]) [id number] Page 5 of 12 Edema Assessment Assessed: [Left: No] [Right: No] [Left: Edema] [Right: :] Calf Left: Right: Point of Measurement: 34 cm From Medial Instep 35.4 cm Ankle Left: Right: Point of Measurement: 12 cm From Medial Instep 23.1 cm Vascular Assessment Pulses: Dorsalis Pedis Palpable: [Left:Yes] Electronic Signature(s) Signed: 11/12/2022 4:47:59 PM By: [name] MSN RN CNS WTA Entered By: [name] on 11/12/2022 08:00:03 -------------------------------------------------------------------------------- Multi Wound Chart Details Patient Name: Date of Service: [name], [name] 11/12/2022 7:45 A M Medical Record Number: [id number] Patient Account Number: [id number] Date of Birth/Sex: Treating  RN: 02-09-1946 (77 y.o. [name] Primary Care Davell Beckstead: [name] Other Clinician: Referring Melesio Madara: Treating Garret Teale/Extender: [name] in Treatment: 14 Vital Signs Height(in): 75 Pulse(bpm): 8 Weight(lbs): 225 Blood Pressure(mmHg): 147/64 Body Mass Index(BMI): 28.1 Temperature(F): 97.3 Respiratory Rate(breaths/min): 16 [19:Photos:] [[name]:[name]] Left, Medial Lower Leg Left, Lateral Lower Leg [name] Wound Location: Surgical Injury Gradually Appeared [name] Wounding Event: Malignant Wound Venous Leg Ulcer [name] Primary Etiology: Arrhythmia, Hypertension, Gout Arrhythmia, Hypertension, Gout [name] Comorbid History: 10/19/2022 01/20/2022 [name] Date Acquired: 2 14 [name] Weeks of Treatment: Open Open [name] Wound Status: No No [name] Wound Recurrence: 2.4x2.3x0.1 1.2x1x0.1 [name] Measurements L x W x D (cm) JAIDEV, SANGER [name] ([id number]) [id number] Page 6 of 12 4.335 0.942 [name] A (cm) : rea 0.434 0.094 [name] Volume (cm) : 32.20% 94.60% [name] % Reduction in Area: 32.10% 97.30% [name] % Reduction in Volume: Full Thickness Without Exposed Full Thickness Without Exposed [name] Classification: Support Structures Support Structures Large Medium [name] Exudate Amount: Serosanguineous Serosanguineous [name] Exudate Type: red, brown red, brown [name] Exudate Color: Flat and Intact [name] [name] Wound Margin: Medium (34-66%) Small (1-33%) [name] Granulation Amount: Red Red [name] Granulation Quality: Medium (34-66%) Medium (34-66%) [name] Necrotic Amount: Fat Layer (Subcutaneous Tissue): Yes Fat Layer (Subcutaneous Tissue): Yes [name] Exposed Structures: Fascia: No Fascia: No Tendon: No Tendon: No Muscle: No Muscle: No Joint: No Joint: No Bone: No Bone: No None [name] [name] Epithelialization: Treatment Notes Electronic Signature(s) Signed: 11/12/2022 4:47:59 PM By: [name] MSN RN CNS WTA Entered By: [name] on 11/12/2022  08:00:17 -------------------------------------------------------------------------------- Multi-Disciplinary Care Plan Details Patient Name: Date of Service: [name] [name] 11/12/2022 7:45 A M Medical Record Number: [id number] Patient Account Number: [id number] Date of Birth/Sex: Treating RN: 1945-11-15 (77 y.o. [name] Primary Care Kaveh Kissinger: [name] Other Clinician: Referring Lenoard Helbert: Treating Amal Renbarger/Extender: [name] in Treatment: 14 Active Inactive Necrotic Tissue Nursing Diagnoses: Impaired tissue integrity related to necrotic/devitalized tissue Knowledge deficit related to management of necrotic/devitalized tissue Goals: Necrotic/devitalized tissue will be minimized in the wound bed Date Initiated: 09/04/2022 Target Resolution Date: 11/12/2022 Goal Status: Active Patient/caregiver will verbalize understanding of reason and process for debridement of necrotic tissue Date Initiated: 09/04/2022 Date Inactivated: 09/10/2022 Target Resolution Date: 09/27/2022 Goal Status: Met Interventions: Assess patient pain level pre-, during and post procedure and prior to discharge Provide education on necrotic tissue and debridement  process Treatment Activities: Apply topical anesthetic as ordered : 09/04/2022 Excisional debridement : 09/04/2022 Notes: [name], [name] ([id number]) [id number] Page 7 of 12 Venous Leg Ulcer Nursing Diagnoses: Actual venous Insuffiency (use after diagnosis is confirmed) Knowledge deficit related to disease process and management Potential for venous Insuffiency (use before diagnosis confirmed) Goals: Non-invasive venous studies are completed as ordered Date Initiated: 09/04/2022 Date Inactivated: 10/26/2022 Target Resolution Date: 09/03/2022 Goal Status: Met Patient will maintain optimal edema control Date Initiated: 09/04/2022 Date Inactivated: 11/12/2022 Target Resolution Date:  11/12/2022 Goal Status: Met Patient/caregiver will verbalize understanding of disease process and disease management Date Initiated: 09/04/2022 Date Inactivated: 11/12/2022 Target Resolution Date: 11/12/2022 Goal Status: Met Verify adequate tissue perfusion prior to therapeutic compression application Date Initiated: 09/04/2022 Target Resolution Date: 11/12/2022 Goal Status: Active Interventions: Assess peripheral edema status every visit. Compression as ordered Provide education on venous insufficiency Treatment Activities: Therapeutic compression applied : 09/04/2022 Notes: Wound/Skin Impairment Nursing Diagnoses: Impaired tissue integrity Knowledge deficit related to smoking impact on wound healing Knowledge deficit related to ulceration/compromised skin integrity Goals: Patient/caregiver will verbalize understanding of skin care regimen Date Initiated: 09/04/2022 Date Inactivated: 09/10/2022 Target Resolution Date: 10/04/2022 Goal Status: Met Ulcer/skin breakdown will have a volume reduction of 30% by week 4 Date Initiated: 09/04/2022 Date Inactivated: 09/10/2022 Target Resolution Date: 10/04/2022 Goal Status: Unmet Unmet Reason: comorbidities Ulcer/skin breakdown will have a volume reduction of 50% by week 8 Date Initiated: 09/04/2022 Date Inactivated: 11/05/2022 Target Resolution Date: 11/04/2022 Goal Status: Met Ulcer/skin breakdown will have a volume reduction of 80% by week 12 Date Initiated: 09/04/2022 Target Resolution Date: 12/05/2022 Goal Status: Active Ulcer/skin breakdown will heal within 14 weeks Date Initiated: 09/04/2022 Target Resolution Date: 12/12/2022 Goal Status: Active Interventions: Assess patient/caregiver ability to obtain necessary supplies Assess patient/caregiver ability to perform ulcer/skin care regimen upon admission and as needed Assess ulceration(s) every visit Provide education on ulcer and skin care Notes: Electronic Signature(s) Signed:  11/12/2022 4:47:59 PM By: [name] MSN RN CNS WTA Previous Signature: 11/12/2022 8:09:57 AM Version By: [name] MSN RN CNS WTA Entered By: [name] on 11/12/2022 08:21:47 [name] ([id number]) [id number] Page 8 of 12 -------------------------------------------------------------------------------- Pain Assessment Details Patient Name: Date of Service: [name] [name] 11/12/2022 7:45 A M Medical Record Number: [id number] Patient Account Number: [id number] Date of Birth/Sex: Treating RN: 09/26/1946 (77 y.o. [name] Primary Care Steaven Wholey: [name] Other Clinician: Referring Librada Castronovo: Treating Annastasia Haskins/Extender: [name] in Treatment: 14 Active Problems Location of Pain Severity and Description of Pain Patient Has Paino No Site Locations Pain Management and Medication Current Pain Management: Electronic Signature(s) Signed: 11/12/2022 4:47:59 PM By: [name] MSN RN CNS WTA Entered By: [name] on 11/12/2022 07:48:41 -------------------------------------------------------------------------------- Patient/Caregiver Education Details Patient Name: Date of Service: [name] 2/5/2024andnbsp7:45 A M Medical Record Number: [id number] Patient Account Number: [id number] Date of Birth/Gender: Treating RN: 1946-08-02 (77 y.o. [name] Primary Care Physician: [name] Other Clinician: Referring Physician: Treating Physician/Extender: [name] in Treatment: [address] [place] ([id number]) [id number] Page 9 of 12 Education Assessment Education Provided To: Patient Education Topics Provided Wound/Skin Impairment: Handouts: Caring for Your Ulcer Methods: Explain/Verbal Responses: State content correctly Electronic Signature(s) Signed: 11/12/2022 4:47:59 PM By: [name] MSN RN CNS WTA Entered By: [name] on 11/12/2022  08:21:39 -------------------------------------------------------------------------------- Wound Assessment Details Patient Name: Date of Service: [name] [place], [name] 11/12/2022 7:45 A M Medical  Record Number: [id number] Patient Account Number: [id number] Date of Birth/Sex: Treating RN: 07/26/1946 (77 y.o. [name] Primary Care Deyvi Bonanno: [name] Other Clinician: Referring Rony Ratz: Treating Dalores Weger/Extender: [name] in Treatment: 14 Wound Status Wound Number: 19 Primary Etiology: Malignant Wound Wound Location: Left, Medial Lower Leg Wound Status: Open Wounding Event: Surgical Injury Comorbid History: Arrhythmia, Hypertension, Gout Date Acquired: 10/19/2022 Weeks Of Treatment: 2 Clustered Wound: No Photos Wound Measurements Length: (cm) 2.4 Width: (cm) 2.3 Depth: (cm) 0.1 Area: (cm) 4.335 Volume: (cm) 0.434 % Reduction in Area: 32.2% % Reduction in Volume: 32.1% Epithelialization: None Wound Description Classification: Full Thickness Without Exposed Support Structures Wound Margin: Flat and Intact Exudate Amount: Large VALDEZ, [name] ([id number]) Exudate Type: Serosanguineous Exudate Color: red, brown Foul Odor After Cleansing: No Slough/Fibrino Yes [id number] Page 10 of 12 Wound Bed Granulation Amount: Medium (34-66%) Exposed Structure Granulation Quality: Red Fascia Exposed: No Necrotic Amount: Medium (34-66%) Fat Layer (Subcutaneous Tissue) Exposed: Yes Necrotic Quality: Adherent Slough Tendon Exposed: No Muscle Exposed: No Joint Exposed: No Bone Exposed: No Treatment Notes Wound #19 (Lower Leg) Wound Laterality: Left, Medial Cleanser Peri-Wound Care Topical Primary Dressing Hydrofera Blue Ready Transfer Foam, 2.5x2.5 (in/in) Discharge Instruction: Apply Hydrofera Blue Ready to wound bed as directed Secondary Dressing ABD Pad 5x9 (in/in) Discharge Instruction: Cover with ABD  pad Secured With Compression Wrap 3-LAYER WRAP - Profore Lite LF 3 Multilayer Compression Bandaging System Discharge Instruction: Apply 3 multi-layer wrap as prescribed. Compression Stockings Add-Ons Electronic Signature(s) Signed: 11/12/2022 4:47:59 PM By: [name] MSN RN CNS WTA Entered By: [name] on 11/12/2022 07:58:20 -------------------------------------------------------------------------------- Wound Assessment Details Patient Name: Date of Service: [name], [name] 11/12/2022 7:45 A M Medical Record Number: [id number] Patient Account Number: [id number] Date of Birth/Sex: Treating RN: 1946/01/18 (77 y.o. [name] Primary Care Adiba Fargnoli: [name] Other Clinician: Referring Parneet Glantz: Treating Aiden Helzer/Extender: [name] in Treatment: 14 Wound Status Wound Number: 9 Primary Etiology: Venous Leg Ulcer Wound Location: Left, Lateral Lower Leg Wound Status: Open Wounding Event: Gradually Appeared Comorbid History: Arrhythmia, Hypertension, Gout Date Acquired: 01/20/2022 Weeks Of Treatment: 14 Clustered Wound: No Photos [name], [name] ([id number]) [id number] Page 11 of 12 Wound Measurements Length: (cm) 1.2 Width: (cm) 1 Depth: (cm) 0.1 Area: (cm) 0.942 Volume: (cm) 0.094 % Reduction in Area: 94.6% % Reduction in Volume: 97.3% Wound Description Classification: Full Thickness Without Exposed Suppor Exudate Amount: Medium Exudate Type: Serosanguineous Exudate Color: red, brown t Structures Slough/Fibrino Yes Wound Bed Granulation Amount: Small (1-33%) Exposed Structure Granulation Quality: Red Fascia Exposed: No Necrotic Amount: Medium (34-66%) Fat Layer (Subcutaneous Tissue) Exposed: Yes Necrotic Quality: Adherent Slough Tendon Exposed: No Muscle Exposed: No Joint Exposed: No Bone Exposed: No Treatment Notes Wound #9 (Lower Leg) Wound Laterality: Left, Lateral Cleanser Peri-Wound  Care Topical Primary Dressing Hydrofera Blue Ready Transfer Foam, 2.5x2.5 (in/in) Discharge Instruction: Apply Hydrofera Blue Ready to wound bed as directed Secondary Dressing ABD Pad 5x9 (in/in) Discharge Instruction: Cover with ABD pad Secured With Compression Wrap 3-LAYER WRAP - Profore Lite LF 3 Multilayer Compression Bandaging System Discharge Instruction: Apply 3 multi-layer wrap as prescribed. Compression Stockings Add-Ons Electronic Signature(s) Signed: 11/12/2022 4:47:59 PM By: [name] MSN RN CNS WTA Entered By: [name] on 11/12/2022 07:58:58 [name], [name] ([id number]) [id number] Page 12 of 12 -------------------------------------------------------------------------------- Vitals Details Patient Name: Date of Service: [name] [name] 11/12/2022 7:45 A M Medical Record Number: [id number] Patient Account Number: [id number] Date  of Birth/Sex: Treating RN: 1945-11-28 (77 y.o. [name] Primary Care [place]: [name] Other Clinician: Referring Dick Hark: Treating Berel Najjar/Extender: [name] in Treatment: 14 Vital Signs Time Taken: 07:48 Temperature (F): 97.3 Height (in): 75 Pulse (bpm): 45 Weight (lbs): 225 Respiratory Rate (breaths/min): 16 Body Mass Index (BMI): 28.1 Blood Pressure (mmHg): 147/64 Reference Range: 80 - 120 mg / dl Electronic Signature(s) Signed: 11/12/2022 4:47:59 PM By: [name] MSN RN CNS WTA Entered By: [name] on 11/12/2022 07:48:32

## 2022-11-13 NOTE — Progress Notes (Signed)
[name], [name] ([id number]) [id number] Page 1 of 12 Visit Report for 11/12/2022 Chief Complaint Document Details Patient Name: Date of Service: [name] 11/12/2022 7:45 [name] M Medical Record Number: [id number] Patient Account Number: [id number] Date of Birth/Sex: Treating RN: [birth year] (77 y.o. [name] Primary Care Provider: [name] Other Clinician: Referring Provider: Treating Provider/Extender: [name] in Treatment: 14 Information Obtained from: Patient Chief Complaint Bilateral LE Ulcers Electronic Signature(s) Signed: 11/12/2022 6:11:00 PM By: [name] PA-C Entered By: [name] on 11/12/2022 08:06:58 -------------------------------------------------------------------------------- Debridement Details Patient Name: Date of Service: [name], [name] 11/12/2022 7:45 [name] M Medical Record Number: [id number] Patient Account Number: [id number] Date of Birth/Sex: Treating RN: April 12, 1946 (77 y.o. [name] Primary Care Provider: [name] Other Clinician: Referring Provider: Treating Provider/Extender: [name] in Treatment: 14 Debridement Performed for Assessment: Wound #9 Left,Lateral Lower Leg Performed By: Physician Tommie Sams., PA-C Debridement Type: Debridement Severity of Tissue Pre Debridement: Fat layer exposed Level of Consciousness (Pre-procedure): Awake and Alert Pre-procedure Verification/Time Out No Taken: Start Time: 08:18 T Area Debrided (L x W): otal 1.2 (cm) x 1 (cm) = 1.2 (cm) Tissue and other material debrided: Viable, Non-Viable, Slough, Subcutaneous, Skin: Epidermis, Slough Level: Skin/Subcutaneous Tissue Debridement Description: Excisional Instrument: Curette Bleeding: Minimum Hemostasis Achieved: Pressure Response to Treatment: Procedure was tolerated well Level of Consciousness (Post- Responds to Painful  Stimuli procedure): [name], [name] ([id number]) [id number] Page 2 of 12 Post Debridement Measurements of Total Wound Length: (cm) 1.2 Width: (cm) 1 Depth: (cm) 0.2 Volume: (cm) 0.188 Character of Wound/Ulcer Post Debridement: Stable Severity of Tissue Post Debridement: Fat layer exposed Post Procedure Diagnosis Same as Pre-procedure Electronic Signature(s) Signed: 11/12/2022 4:47:59 PM By: [name] MSN RN CNS WTA Signed: 11/12/2022 6:11:00 PM By: [name] PA-C Entered By: [name] on 11/12/2022 08:19:53 -------------------------------------------------------------------------------- Debridement Details Patient Name: Date of Service: [name], [name] 11/12/2022 7:45 [name] M Medical Record Number: [id number] Patient Account Number: [id number] Date of Birth/Sex: Treating RN: 20-Jan-1946 (77 y.o. [name] Primary Care Provider: [name] Other Clinician: Referring Provider: Treating Provider/Extender: [name] in Treatment: 14 Debridement Performed for Assessment: Wound #19 Left,Medial Lower Leg Performed By: Physician Tommie Sams., PA-C Debridement Type: Debridement Level of Consciousness (Pre-procedure): Awake and Alert Pre-procedure Verification/Time Out No Taken: Start Time: 08:18 T Area Debrided (L x W): otal 2.4 (cm) x 2.3 (cm) = 5.52 (cm) Tissue and other material debrided: Viable, Non-Viable, Slough, Subcutaneous, Skin: Epidermis, Slough Level: Skin/Subcutaneous Tissue Debridement Description: Excisional Instrument: Curette Bleeding: Minimum Hemostasis Achieved: Pressure Response to Treatment: Procedure was tolerated well Level of Consciousness (Post- Responds to Painful Stimuli procedure): Post Debridement Measurements of Total Wound Length: (cm) 2.4 Width: (cm) 2.3 Depth: (cm) 0.2 Volume: (cm) 0.867 Character of Wound/Ulcer Post Debridement: Stable Post Procedure Diagnosis Same as  Pre-procedure Electronic Signature(s) Signed: 11/12/2022 4:47:59 PM By: [name] MSN RN CNS WTA Signed: 11/12/2022 6:11:00 PM By: [name] PA-C Entered By: [name] on 11/12/2022 08:20:42 [name] ([id number]) [id number] Page 3 of 12 -------------------------------------------------------------------------------- HPI Details Patient Name: Date of Service: [name] [name] 11/12/2022 7:45 [name] M Medical Record Number: [id number] Patient Account Number: [id number] Date of Birth/Sex: Treating RN: 03-Sep-1946 (77 y.o. 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 the patient has had [name] vascular workup over 7 years ago and said everything was normal at that stage. He does not have any chronic problems except for cardiac issues which he sees [name] cardiologist in [place]. 08/15/2017 -- arterial and venous duplex studies still pending. 08/23/2017 -- venous reflux studies done on 08/13/2017 shows venous incompetence throughout the left lower extremity deep system and focally at the left saphenofemoral junction. 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 No evidence of SVT or DVT in bilateral lower extremities The patient has an appointment at the end of the month to get his arterial duplex study done 09/05/2017 -- the patient was seen at the vein and vascular office yesterday by Angelena Form. ABI studies were notable for medial calcification and the toe brachial indices were normal and bilateral ankle-brachial) waveforms were normal with triphasic flow. After review of his venous studies he was not [name] candidate for laser ablation and his lymphedema was to be treated with compression stockings and lymphedema pump pumps 09/12/2017 -- had [name] low arterial study done at the  vein and vascular surgery -- unable to obtain reliable ABI is due to medial calcification. Bilateral toe indices were normal with the right being 1.01 and the left being 0.92 and the waveforms were triphasic bilaterally. he did get hold of 30-40 mm compression stockings but is unable to put these on. We will try and get him alternative compression stockings. 09/26/17- he is here in follow up evaluation of [name] right lower extremity ulcer;he is compliant in wearing compression stocking; ulcer almost epithelialized , anticipate healing next appointment Readmission: 11/17 point upon evaluation patient's wound currently that he is seeing [place] for today is [name] skin cancerous lesion that was cleared away by his dermatologist on the left medial calf region. He tells me that this is [name] very similar thing to what he had done previously in fact the last time he saw him in 2018 this was also what was going on at that point. Nonetheless he feels that based on what he seeing currently that this is just having [name] lot of harder time healing although it is much closer to the surface than what he is experienced in the past. He notes that the initial removal was in June 2022 which was this year this is now November and still has not closed. He does have some edema and definitely I think that there is some venous component to his slow healing here. Also think that we can do something better than Vaseline to try to help with getting this to clear up as quickly as possible. He does have [name] history of atrial fibrillation and is on Eliquis  otherwise he really has no major medical problems that would affect wound healing. 09/07/2021 upon evaluation today patient actually appears to be doing significantly better after having wrapped him last week. Overall I think that this is making significant improvements at this time which is great news. I do not see any evidence of infection which is great news as well. No fevers, chills, nausea, vomiting, or diarrhea. 09/14/2021 upon evaluation today patient appears to be doing well with regard to his  leg ulcer. He has been tolerating the dressing changes and overall I think that he is making excellent progress. I do not see any signs of active infection at this time. 09/21/2021 upon evaluation today patient actually appears to be making good progress with regard to his wound this is again measuring smaller today no debridement seems to be necessary. We have been using [name] silver collagen dressing and I think that is doing an awesome job. 09/28/2021 upon evaluation today patient appears to be doing well with regard to his leg currently. I do not see any signs of active infection at this time which is great news. No fevers, chills, nausea, vomiting, or diarrhea. I think this wound is very close to complete resolution. 10/12/2021 upon evaluation today patient actually appears to be doing awesome in regard to his leg ulcer. In fact this appears to be completely healed based on what I am seeing currently. I do not see any evidence of active infection locally nor systemically at this time which is also great news. No fevers, chills, nausea, vomiting, or diarrhea. Readmission: 12/07/2021 upon evaluation today patient presents for readmission here in the clinic. He was discharged on 10/12/2021 is completely healed. Unfortunately this has reopened at this point and he is having continual issues with new blisters over both lower extremities. This is even worse than what we previously saw. Nonetheless we did actually  check his ABIs today and it did reveal that his ABIs were 0.55 on the left and 0.57 on the right. Subsequently this is [name] definite change from his last arterial study which showed that he did have good blood flow at 1.01 on the right and 0.92 on the left and that was right at the beginning of 2019. Nonetheless based on what we see currently I do think he tolerated the 3 layer compression wrap but I do believe that we probably need to get him tested for his arterial flow in order to see where things stand and if there is something we can do there that would help prevent this from continue to be an ongoing issue. He did not utilize compression socks in the interim from when he was last here till this time. That something is probably going to need lifelong going forward as well. 3/9; patient presents for follow-up. He has no issues or complaints today. He tolerated the compression wrap well. He had ABIs with TBI's done. He denies signs of infection. [name], [name] ([id number]) [id number] Page 4 of 12 12/21/2021 upon evaluation today patient appears to be doing well with regard to the wounds on his legs. Both are showing signs of significant improvement which is great news although I do believe some sharp debridement would be of benefit here as well. 12/28/2021 upon evaluation today patient appears to be doing well with regard to his wounds. Everything is showing signs of excellent improvement which I am very pleased about. I think that we are headed in the right direction here. Fortunately there does not appear to be any evidence of infection which is great news there is [name] little bit of hypergranulation. 01/04/2022 upon evaluation today patient appears to be doing well with regard to his wounds 2 of them are healed 1 is almost so and the other 1 is significantly better. Overall I am extremely pleased with where we stand and I think that he is making excellent progress here. I do  not see any evidence of active infection locally nor systemically at this time. 01-16-2022  upon evaluation today patient's wound on the left leg is showing signs of doing quite well. Has not completely cleared at this point but it is much improved. Fortunately I do not see any signs of infection at this time. No fevers, chills, nausea, vomiting, or diarrhea. 01-23-2022 upon evaluation today patient's wound of the left leg actually appears to be pretty much completely healed which is great news. I do not see any signs of active infection locally or systemically which is excellent. With that being said on the right leg what wound is measuring smaller the other 1 is [name] new wound that just showed up fortunately its not too bad. Has been using Xeroform here and that seems to be doing decently well which is great news. Unfortunately his blood pressure is significantly high we gave him the readings for the past 4-5 visits as well as [name] recommendation to make an appointment to go discuss this with his primary care provider patient states that he is going to look into doing this. 01-30-2022 upon evaluation today patient appears to be doing well with regard to his left leg everything appears to be healed. On the right leg the more anterior wound is healed the more medial wound that I been concerned about [name] possible skin cancer unfortunately still does not look great to me. I do believe that we should probably do [name] biopsy I have talked about it with him [name] few times I think though it is probably time to go ahead and do this at this point. 02-09-2022 upon evaluation today patient appears to be doing well with regard to his legs. On the left this appears to be completely healed. On the right he does have 2 areas and be perfectly honest one of them is [name] skin cancer that he is going to the Mohs surgery clinic for the other seems to be healing nicely. Readmission: 08-02-2022 upon evaluation today patient appears for  reevaluation here in our clinic concerning issues that he has been having with wounds over the bilateral lower extremities. I last saw him in May 2023 and at that point we had him completely healed. Unfortunately he is tells me this has broken down to some degree since that point. 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 He tells me this 1 seems to just be getting bigger and bigger not improving. Fortunately he is not having any significant pain which is good news he does have quite [name] bit of swelling and he tells me that his fluid pills are not recommended for him to take daily but just in 3-day intervals here and there. 08-09-2022 upon evaluation today patient appears to be doing still somewhat poorly in regard to his legs although in general he does not appear to be feeling as good as he has been. Fortunately there does not appear to be any signs of infection which is good news. With that being said he is having some issues here with having and overall poor feeling in general which again is good I think going to be the biggest complicating factor. He actually seems to be coughing I do not hear any wheezing right now I did listen to his chest he did not have good airflow down low however makes me suspicious for bronchitis  or even possibly pneumonia which could be part of what is going on here as well. Fortunately I do not see any evidence of anything worsening in regard to his legs but I definitely believe that he needs to continue with the compression wraps he took them off yesterday to shower has not had anything on for 24 hours that is why his legs are so swollen today. With regard to his pathology report I did review that it showed some squamous abnormality but no signs of distinct carcinoma. With  that being said it was saying that it could be adjacent to [name] squamous cell carcinoma nonetheless my suggestion is can be that we have the patient take copy of this report and give it to his Mohs surgeon in order for them to see if there is anything they feel like needs to be done further. With that being said right now I feel like the primary thing is going to be for [place] to try to get his swelling down and keep that down into that hand since he is having so much drainage I believe we can have to bring him in for dressing changes twice [name] week doing [name] nurse visit on Mondays. 11/9; since the patient was last here he spent the night in the emergency room he received IV Lasix. Also received antibiotics although he was not discharged on either 1 of these. He also saw his cardiology office who put him on regular Lasix 20 mg [previously on as needed Lasix 20 mg]. Per our intake nurse the swelling in his legs is remarkably better but he still has bilateral lower extremity wounds. He still has wounds on the bilateral lower extremities most problematically on the left lateral calf. He has been using silver alginate under 3 layer compression. 08-23-2022 upon evaluation today patient appears to be doing much better than the last time I saw him 2 weeks ago. At that point I was very concerned about how he was doing he did see Dr. Caryn Section his primary care provider they got him on some blood pressure medication in general his color and overall appearance looks to be doing much improved compared to the last time I saw him. 09-04-2022 upon evaluation today patient appears to be doing well currently in regard to his wounds. Everything is showing signs of improvement which is great news. Fortunately there does not appear to be any signs of active infection locally or systemically at this time. No fevers, chills, nausea, vomiting, or diarrhea. 09-10-2022 upon evaluation today patient appears to be doing better in regard to  his wounds although the [facility] was extremely stuck to the wound bed. Fortunately there does not appear to be any signs of infection locally or systemically at this time which is great news. No fevers, chills, nausea, vomiting, or diarrhea. 09-17-2022 upon evaluation today patient appears to be doing well currently in regard to his wounds in general. The right leg actually showing signs of excellent improvement and very pleased with where things stand in that regard. Fortunately I do not see any evidence of infection locally or systemically at this time which is great news. No fevers, chills, nausea, vomiting, or diarrhea. 09-24-2022 upon evaluation today patient appears to be doing well currently in regard to his wounds. Things look to be doing quite well. 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 He is seeing his dermatologist tomorrow in that regard. With that being said other than that however he seems to really be making some pretty good progress here which is good news. No fevers, chills, nausea, vomiting, or diarrhea. 12/26; the patient has 2 open wounds remaining on the left leg. One is on the left anterior mid tibia and the other is on the right lateral knee just outside of the popliteal fossa. 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 The patient has been to see dermatology Dr. Evorn Gong who apparently is making him [name] referral to the [facility]. He does not yet have an appointment 10-12-2022 upon evaluation today patient appears to be doing well currently in regard to his wound. He has been tolerating the dressing changes without complication and overall feel like we are headed in the right direction. Fortunately I do not see any signs of infection locally or systemically at this time which is great  news. No fevers, chills, nausea, vomiting, or diarrhea. 10-23-2022 upon evaluation today patient appears to be doing well currently in regard to his wound. He has been tolerating the dressing changes without complication. Fortunately there does not appear to be any signs of active infection locally nor systemically which is great news and overall I am extremely pleased with where we stand currently. No fevers, chills, nausea, vomiting, or diarrhea. 10-26-2022 upon evaluation today patient appears to be doing well currently in regard to his wounds. Everything is showing signs of improvement and this is great news. Fortunately I see no evidence of active infection systemically. He does seem to be doing much better in regard to the local infection in regard to [name], [name] ([id number]) [id number] Page 5 of 12 his leg. The smell is also greatly improved. Overall I am extremely happy with where we stand today. This is after just [name] few days with the antibiotic on board. 11-05-2022 upon evaluation today patient appears to be doing well currently in regard to his wounds although the wound where they performed the Mohs surgery does look [name] little bit hyper granulated I think switching to [facility] may be better for him. He voiced understanding. Fortunately there does not appear to be any evidence of active infection locally nor systemically at this time. 11-12-2022 upon evaluation today patient appears to be doing better in regard to both wounds he has been tolerating the dressing changes without complication. There is no signs of infection and in general I think you are doing quite well. No fevers, chills, nausea, vomiting, or diarrhea. Electronic Signature(s) Signed: 11/12/2022 6:07:07 PM By: [name] PA-C Entered By: [name] on 11/12/2022 18:07:07 -------------------------------------------------------------------------------- Physical Exam Details Patient  Name: Date of Service: [name] [place], [name] 11/12/2022 7:45 [name] M Medical Record Number: [id number] Patient Account Number: [id number] Date of Birth/Sex: Treating RN: December 10, 1945 (77 y.o. [name] Primary Care Provider: [name] Other Clinician: Referring Provider: Treating Provider/Extender: [name] in Treatment: 48 Constitutional Well-nourished and well-hydrated in no acute distress. Respiratory normal breathing without difficulty. Psychiatric this patient is able to make decisions and demonstrates good insight into disease process. Alert and Oriented x 3. pleasant and cooperative. Notes Upon inspection patient's wound bed showed signs of good granulation epithelization at this point and overall I am extremely pleased with where we stand currently. Electronic Signature(s) Signed: 11/12/2022 6:07:41 PM By: [name] PA-C Entered By: [name]  on 11/12/2022 18:07:41 -------------------------------------------------------------------------------- Physician Orders Details Patient Name: Date of Service: [name] [name] 11/12/2022 7:45 [name] M Medical Record Number: [id number] Patient Account Number: [id number] Date of Birth/Sex: Treating RN: Mar 04, 1946 (77 y.o. [name] Primary Care Provider: [name] Other Clinician: Referring Provider: Treating Provider/Extender: [name] in Treatment: [address] [place] ([id number]) [id number] Page 6 of 12 Verbal / Phone Orders: No Diagnosis Coding Follow-up Appointments Return Appointment in 1 week. Nurse Visit as needed Bathing/ Shower/ Hygiene May shower with wound dressing protected with water repellent cover or cast protector. No tub bath. Anesthetic (Use 'Patient Medications' Section for Anesthetic Order Entry) Wound #9 Left,Lateral Lower Leg Lidocaine applied to wound bed Edema Control - Lymphedema / Segmental Compressive Device  / Other Bilateral Lower Extremities 3 Layer Compression System for Lymphedema. - Left leg Patient to wear own compression stockings. Remove compression stockings every night before going to bed and put on every morning when getting up. - right lower leg Elevate, Exercise Daily and [name] void Standing for Long Periods of Time. Elevate legs to the level of the heart and pump ankles as often as possible Elevate leg(s) parallel to the floor when sitting. DO YOUR BEST to sleep in the bed at night. DO NOT sleep in your recliner. Long hours of sitting in [name] recliner leads to swelling of the legs and/or potential wounds on your backside. Additional Orders / Instructions Follow Nutritious Diet and Increase Protein Intake Medications-Please add to medication list. ntibiotics - Continue antibiotics as prescribed until completed. P.O. [name] Wound Treatment Wound #19 - Lower Leg Wound Laterality: Left, Medial Prim Dressing: Hydrofera Blue Ready Transfer Foam, 2.5x2.5 (in/in) 1 x Per Day/30 Days ary Discharge Instructions: Apply Hydrofera Blue Ready to wound bed as directed Secondary Dressing: ABD Pad 5x9 (in/in) 1 x Per Day/30 Days Discharge Instructions: Cover with ABD pad Compression Wrap: 3-LAYER WRAP - Profore Lite LF 3 Multilayer Compression Bandaging System 1 x Per Day/30 Days Discharge Instructions: Apply 3 multi-layer wrap as prescribed. Wound #9 - Lower Leg Wound Laterality: Left, Lateral Prim Dressing: Hydrofera Blue Ready Transfer Foam, 2.5x2.5 (in/in) 1 x Per Day/30 Days ary Discharge Instructions: Apply Hydrofera Blue Ready to wound bed as directed Secondary Dressing: ABD Pad 5x9 (in/in) 1 x Per Day/30 Days Discharge Instructions: Cover with ABD pad Compression Wrap: 3-LAYER WRAP - Profore Lite LF 3 Multilayer Compression Bandaging System 1 x Per Day/30 Days Discharge Instructions: Apply 3 multi-layer wrap as prescribed. Electronic Signature(s) Signed: 11/12/2022 4:47:59 PM By: [name] MSN  RN CNS WTA Signed: 11/12/2022 6:11:00 PM By: [name] PA-C Previous Signature: 11/12/2022 8:04:20 AM Version By: [name] MSN RN CNS WTA Entered By: [name] on 11/12/2022 08:20:59 Problem List Details -------------------------------------------------------------------------------- [name] ([id number]) [id number] Page 7 of 12 Patient Name: Date of Service: [name] 11/12/2022 7:45 [name] M Medical Record Number: [id number] Patient Account Number: [id number] Date of Birth/Sex: Treating RN: Jul 17, 1946 (77 y.o. [name] Primary Care Provider: [name] Other Clinician: Referring Provider: Treating Provider/Extender: [name] in Treatment: 14 Active Problems ICD-10 Encounter Code Description Active Date MDM Diagnosis 604 482 2092 Chronic venous hypertension (idiopathic) with ulcer and inflammation of 08/02/2022 No Yes bilateral lower extremity L97.822 Non-pressure chronic ulcer of other part of left lower leg with fat layer 08/02/2022 No Yes exposed L97.812 Non-pressure chronic ulcer of other part of right lower leg with fat layer 08/02/2022 No Yes exposed  I73.89 Other specified peripheral vascular diseases 08/02/2022 No Yes I48.0 Paroxysmal atrial fibrillation 08/02/2022 No Yes I10 Essential (primary) hypertension 08/02/2022 No Yes Inactive Problems Resolved Problems Electronic Signature(s) Signed: 11/12/2022 4:47:59 PM By: [name] MSN RN CNS WTA Signed: 11/12/2022 6:11:00 PM By: [name] PA-C Entered By: [name] on 11/12/2022 08:21:57 -------------------------------------------------------------------------------- Progress Note Details Patient Name: Date of Service: [name], [name] 11/12/2022 7:45 [name] M Medical Record Number: [id number] Patient Account Number: [id number] Date of Birth/Sex: Treating RN: [birth year] (77 y.o. 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 the patient has had [name] vascular workup over 7 years ago and said everything was normal at that stage. He does not have any chronic problems except for cardiac issues which he sees [name] cardiologist in [place]. 08/15/2017 -- arterial and venous duplex studies still pending. 08/23/2017 -- venous reflux studies done on 08/13/2017 shows venous incompetence throughout the left lower extremity deep system and focally at the left saphenofemoral junction. 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 No evidence of SVT or DVT in bilateral lower extremities The patient has an appointment at the end of the month to get his arterial duplex study done 09/05/2017 -- the patient was seen at the vein and vascular office yesterday by Angelena Form. ABI studies were notable for medial calcification and the toe brachial indices were normal and bilateral ankle-brachial) waveforms were normal with triphasic flow. After review of his venous studies he was not [name] candidate for laser ablation and his lymphedema was to be treated with compression stockings and lymphedema pump pumps 09/12/2017 -- had [name] low arterial study done at the [facility] -- unable to obtain reliable ABI is due to medial  calcification. Bilateral toe indices were normal with the right being 1.01 and the left being 0.92 and the waveforms were triphasic bilaterally. he did get hold of 30-40 mm compression stockings but is unable to put these on. We will try and get him alternative compression stockings. 09/26/17- he is here in follow up evaluation of [name] right lower extremity ulcer;he is compliant in wearing compression stocking; ulcer almost epithelialized , anticipate healing next appointment Readmission: 11/17 point upon evaluation patient's wound currently that he is seeing [place] for today is [name] skin cancerous lesion that was cleared away by his dermatologist on the left medial calf region. He tells me that this is [name] very similar thing to what he had done previously in fact the last time he saw him in 2018 this was also what was going on at that point. Nonetheless he feels that based on what he seeing currently that this is just having [name] lot of harder time healing although it is much closer to the surface than what he is experienced in the past. He notes that the initial removal was in June 2022 which was this year this is now November and still has not closed. He does have some edema and definitely I  think that there is some venous component to his slow healing here. Also think that we can do something better than Vaseline to try to help with getting this to clear up as quickly as possible. He does have [name] history of atrial fibrillation and is on Eliquis otherwise he really has no major medical problems that would affect wound healing. 09/07/2021 upon evaluation today patient actually appears to be doing significantly better after having wrapped him last week. Overall I think that this is making significant improvements at this time which is great news. I do not see any evidence of infection which is great news as well. No fevers, chills, nausea, vomiting, or diarrhea. 09/14/2021 upon evaluation today patient appears to be  doing well with regard to his leg ulcer. He has been tolerating the dressing changes and overall I think that he is making excellent progress. I do not see any signs of active infection at this time. 09/21/2021 upon evaluation today patient actually appears to be making good progress with regard to his wound this is again measuring smaller today no debridement seems to be necessary. We have been using [name] silver collagen dressing and I think that is doing an awesome job. 09/28/2021 upon evaluation today patient appears to be doing well with regard to his leg currently. I do not see any signs of active infection at this time which is great news. No fevers, chills, nausea, vomiting, or diarrhea. I think this wound is very close to complete resolution. 10/12/2021 upon evaluation today patient actually appears to be doing awesome in regard to his leg ulcer. In fact this appears to be completely healed based on what I am seeing currently. I do not see any evidence of active infection locally nor systemically at this time which is also great news. No fevers, chills, nausea, vomiting, or diarrhea. Readmission: 12/07/2021 upon evaluation today patient presents for readmission here in the clinic. He was discharged on 10/12/2021 is completely healed. Unfortunately this has reopened at this point and he is having continual issues with new blisters over both lower extremities. This is even worse than what we previously saw. Nonetheless we did actually check his ABIs today and it did reveal that his ABIs were 0.55 on the left and 0.57 on the right. Subsequently this is [name] definite change from his last arterial study which showed that he did have good blood flow at 1.01 on the right and 0.92 on the left and that was right at the beginning of 2019. Nonetheless based on what we see currently I do think he tolerated the 3 layer compression wrap but I do believe that we probably need to get him tested for his arterial flow in  order to see where things stand and if there is something we can do there that would help prevent this from continue to be an ongoing issue. He did not utilize compression socks in the interim from when he was last here till this time. That something is probably going to need lifelong going forward as well. 3/9; patient presents for follow-up. He has no issues or complaints today. He tolerated the compression wrap well. He had ABIs with TBI's done. He denies signs of infection. 12/21/2021 upon evaluation today patient appears to be doing well with regard to the wounds on his legs. Both are showing signs of significant improvement which is great news although I do believe some sharp debridement would be of benefit here as well. 12/28/2021 upon evaluation today patient appears  to be doing well with regard to his wounds. Everything is showing signs of excellent improvement which I am very pleased about. I think that we are headed in the right direction here. Fortunately there does not appear to be any evidence of infection which is great news there is [name] little bit of hypergranulation. 01/04/2022 upon evaluation today patient appears to be doing well with regard to his wounds 2 of them are healed 1 is almost so and the other 1 is significantly better. Overall I am extremely pleased with where we stand and I think that he is making excellent progress here. I do not see any evidence of active infection locally nor systemically at this time. 01-16-2022 upon evaluation today patient's wound on the left leg is showing signs of doing quite well. Has not completely cleared at this point but it is much improved. Fortunately I do not see any signs of infection at this time. No fevers, chills, nausea, vomiting, or diarrhea. 01-23-2022 upon evaluation today patient's wound of the left leg actually appears to be pretty much completely healed which is great news. I do not see any signs of active infection locally or  systemically which is excellent. With that being said on the right leg what wound is measuring smaller the other 1 is [name] new wound that just showed up fortunately its not too bad. Has been using Xeroform here and that seems to be doing decently well which is great news. Unfortunately his blood pressure is significantly high we gave him the readings for the past 4-5 visits as well as [name] recommendation to make an appointment to go discuss this with his primary care provider patient states that he is going to look into doing this. 01-30-2022 upon evaluation today patient appears to be doing well with regard to his left leg everything appears to be healed. On the right leg the more anterior wound is healed the more medial wound that I been concerned about [name] possible skin cancer unfortunately still does not look great to me. I do believe that we [name], [name] ([id number]) [id number] Page 9 of 12 should probably do [name] biopsy I have talked about it with him [name] few times I think though it is probably time to go ahead and do this at this point. 02-09-2022 upon evaluation today patient appears to be doing well with regard to his legs. On the left this appears to be completely healed. On the right he does have 2 areas and be perfectly honest one of them is [name] skin cancer that he is going to the Mohs surgery clinic for the other seems to be healing nicely. Readmission: 08-02-2022 upon evaluation today patient appears for reevaluation here in our clinic concerning issues that he has been having with wounds over the bilateral lower extremities. I last saw him in May 2023 and at that point we had him completely healed. Unfortunately he is tells me this has broken down to some degree since that point. 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 He tells me this 1 seems to just be getting bigger and bigger not improving. Fortunately he is not having any significant pain which is good news he does have quite [name] bit of swelling and he tells me that his  fluid pills are not recommended for him to take daily but just in 3-day intervals here and there. 08-09-2022 upon evaluation today patient appears to be doing still somewhat poorly in regard to his legs although in general he does not appear to be feeling as good as he has been. Fortunately there does not appear to be any signs of infection which is good news. With that being said he is having some issues here with having and overall poor feeling in general which again is good I think going to be the biggest complicating factor. He actually seems to be coughing I do not hear any wheezing right now I did listen to his chest he did not have good airflow down low however makes me suspicious for bronchitis or even possibly pneumonia which could be part of what is going on here as well. Fortunately I do not see any evidence of anything worsening in regard to his legs but I definitely believe that he needs to continue with the compression wraps he took them off yesterday to shower has not had anything on for 24 hours that is why his legs are so swollen today. With regard to his pathology report I did review that it showed some squamous abnormality but no signs of distinct carcinoma. With that being said it was saying that it could be adjacent to [name] squamous cell carcinoma nonetheless my suggestion is can be that we have the patient take copy of this report and give it to his Mohs surgeon in order for them to see if there is anything they feel like needs to be done further. With that being said right now I feel like the primary thing is going to be for [place] to try to get his swelling down and keep that down into that hand since he is having so much drainage I  believe we can have to bring him in for dressing changes twice [name] week doing [name] nurse visit on Mondays. 11/9; since the patient was last here he spent the night in the emergency room he received IV Lasix. Also received antibiotics although he was not discharged on either 1 of these. He also saw his cardiology office who put him on regular Lasix 20 mg [previously on as needed Lasix 20 mg]. Per our intake nurse the swelling in his legs is remarkably better but he still has bilateral lower extremity wounds. He still has wounds on the bilateral lower extremities most problematically on the left lateral calf. He has been using silver alginate under 3 layer compression. 08-23-2022 upon evaluation today patient appears to be doing much better than the last time I saw him 2 weeks ago. At that point I was very concerned about how he was doing he did see Dr. Caryn Section his primary care provider they got him on some blood pressure medication in general his color and overall appearance looks to be doing much improved compared to the last time I saw him. 09-04-2022 upon evaluation today patient appears to be doing well currently in regard to his wounds. Everything is showing signs of improvement which is great news. Fortunately there does not appear to be any signs of active infection locally or systemically at this time. No fevers, chills, nausea, vomiting, or diarrhea. 09-10-2022 upon evaluation today patient appears to be doing better in regard to his wounds although the [facility] was extremely stuck to the wound bed. Fortunately there does not appear to be any signs of infection locally or systemically  at this time which is great news. No fevers, chills, nausea, vomiting, or diarrhea. 09-17-2022 upon evaluation today patient appears to be doing well currently in regard to his wounds in general. The right leg actually showing signs of excellent improvement and very pleased with where things stand in that regard.  Fortunately I do not see any evidence of infection locally or systemically at this time which is great news. No fevers, chills, nausea, vomiting, or diarrhea. 09-24-2022 upon evaluation today patient appears to be doing well currently in regard to his wounds. Things look to be doing quite well. 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 He is seeing his dermatologist tomorrow in that regard. With that being said other than that however he seems to really be making some pretty good progress here which is good news. No fevers, chills, nausea, vomiting, or diarrhea. 12/26; the patient has 2 open wounds remaining on the left leg. One is on the left anterior mid tibia and the other is on the right lateral knee just outside of the popliteal fossa. 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 The patient has been to see dermatology Dr. Evorn Gong who apparently is making him [name] referral to the [facility] Moh[facility] surgery center. He does not yet have an appointment 10-12-2022 upon evaluation today patient appears to be doing well currently in regard to his wound. He has been tolerating the dressing changes without complication and overall feel like we are headed in the right direction. Fortunately I do not see any signs of infection locally or systemically at this time which is great news. No fevers, chills, nausea, vomiting, or diarrhea. 10-23-2022 upon evaluation today patient appears to be doing well currently in regard to his wound. He has been tolerating the dressing changes without complication. Fortunately there does not appear to be any signs of active infection locally nor systemically which is great news and overall I am extremely pleased with where we stand currently. No fevers, chills, nausea, vomiting, or diarrhea. 10-26-2022 upon evaluation  today patient appears to be doing well currently in regard to his wounds. Everything is showing signs of improvement and this is great news. Fortunately I see no evidence of active infection systemically. He does seem to be doing much better in regard to the local infection in regard to his leg. The smell is also greatly improved. Overall I am extremely happy with where we stand today. This is after just [name] few days with the antibiotic on board. 11-05-2022 upon evaluation today patient appears to be doing well currently in regard to his wounds although the wound where they performed the Mohs surgery does look [name] little bit hyper granulated I think switching to [facility] may be better for him. He voiced understanding. Fortunately there does not appear to be any evidence of active infection locally nor systemically at this time. 11-12-2022 upon evaluation today patient appears to be doing better in regard to both wounds he has been tolerating the dressing changes without complication. There is no signs of infection and in general I think you are doing quite well. No fevers, chills, nausea, vomiting, or diarrhea. Objective Constitutional Well-nourished and well-hydrated in no acute distress. Vitals Time Taken: 7:48 AM, Height: 75 in, Weight: 225 lbs, BMI: 28.1, Temperature: 97.3 F, Pulse: 45 bpm, Respiratory Rate: 16 breaths/min, Blood  Pressure: [name], [name] ([id number]) [id number] Page 10 of 12 147/64 mmHg. Respiratory normal breathing without difficulty. Psychiatric this patient is able to make decisions and demonstrates good insight into disease process. Alert and Oriented x 3. pleasant and cooperative. General Notes: Upon inspection patient's wound bed showed signs of good granulation epithelization at this point and overall I am extremely pleased with where we stand currently. Integumentary (Hair, Skin) Wound #19 status is Open. Original cause of wound was  Surgical Injury. The date acquired was: 10/19/2022. The wound has been in treatment 2 weeks. The wound is located on the Left,Medial Lower Leg. The wound measures 2.4cm length x 2.3cm width x 0.1cm depth; 4.335cm^2 area and 0.434cm^3 volume. There is Fat Layer (Subcutaneous Tissue) exposed. There is [name] large amount of serosanguineous drainage noted. The wound margin is flat and intact. There is medium (34-66%) red granulation within the wound bed. There is [name] medium (34-66%) amount of necrotic tissue within the wound bed including Adherent Slough. Wound #9 status is Open. Original cause of wound was Gradually Appeared. The date acquired was: 01/20/2022. The wound has been in treatment 14 weeks. The wound is located on the Left,Lateral Lower Leg. The wound measures 1.2cm length x 1cm width x 0.1cm depth; 0.942cm^2 area and 0.094cm^3 volume. There is Fat Layer (Subcutaneous Tissue) exposed. There is [name] medium amount of serosanguineous drainage noted. There is small (1-33%) red granulation within the wound bed. There is [name] medium (34-66%) amount of necrotic tissue within the wound bed including Adherent Slough. Assessment Active Problems ICD-10 Chronic venous hypertension (idiopathic) with ulcer and inflammation of bilateral lower extremity Non-pressure chronic ulcer of other part of left lower leg with fat layer exposed Non-pressure chronic ulcer of other part of right lower leg with fat layer exposed Other specified peripheral vascular diseases Paroxysmal atrial fibrillation Essential (primary) hypertension Procedures Wound #19 Pre-procedure diagnosis of Wound #19 is [name] Malignant Wound located on the Left,Medial Lower Leg . There was [name] Excisional Skin/Subcutaneous Tissue Debridement with [name] total area of 5.52 sq cm performed by Tommie Sams., PA-C. With the following instrument(s): Curette to remove Viable and Non-Viable tissue/material. Material removed includes Subcutaneous Tissue, Slough, and Skin:  Epidermis. No specimens were taken.[name] Minimum amount of bleeding was controlled with Pressure. The procedure was tolerated well. Post Debridement Measurements: 2.4cm length x 2.3cm width x 0.2cm depth; 0.867cm^3 volume. Character of Wound/Ulcer Post Debridement is stable. Post procedure Diagnosis Wound #19: Same as Pre-Procedure Pre-procedure diagnosis of Wound #19 is [name] Malignant Wound located on the Left,Medial Lower Leg . There was [name] Three Layer Compression Therapy Procedure by [name], RN. Post procedure Diagnosis Wound #19: Same as Pre-Procedure Wound #9 Pre-procedure diagnosis of Wound #9 is [name] Venous Leg Ulcer located on the Left,Lateral Lower Leg .Severity of Tissue Pre Debridement is: Fat layer exposed. There was [name] Excisional Skin/Subcutaneous Tissue Debridement with [name] total area of 1.2 sq cm performed by Tommie Sams., PA-C. With the following instrument(s): Curette to remove Viable and Non-Viable tissue/material. Material removed includes Subcutaneous Tissue, Slough, and Skin: Epidermis. No specimens were taken.[name] Minimum amount of bleeding was controlled with Pressure. The procedure was tolerated well. Post Debridement Measurements: 1.2cm length x 1cm width x 0.2cm depth; 0.188cm^3 volume. Character of Wound/Ulcer Post Debridement is stable. Severity of Tissue Post Debridement is: Fat layer exposed. 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 There was  [name] Three Layer Compression Therapy Procedure by [name], RN. Post procedure Diagnosis Wound #9: Same as Pre-Procedure Plan Follow-up Appointments: Return Appointment in 1 week. Nurse Visit as needed Bathing/ Shower/ Hygiene: May shower with wound dressing protected with water repellent cover or cast protector. No tub bath. Anesthetic (Use 'Patient Medications' Section for Anesthetic Order Entry): [name], [name]  ([id number]) [id number] Page 11 of 12 Wound #9 Left,Lateral Lower Leg: Lidocaine applied to wound bed Edema Control - Lymphedema / Segmental Compressive Device / Other: 3 Layer Compression System for Lymphedema. - Left leg Patient to wear own compression stockings. Remove compression stockings every night before going to bed and put on every morning when getting up. - right lower leg Elevate, Exercise Daily and Avoid Standing for Long Periods of Time. Elevate legs to the level of the heart and pump ankles as often as possible Elevate leg(s) parallel to the floor when sitting. DO YOUR BEST to sleep in the bed at night. DO NOT sleep in your recliner. Long hours of sitting in [name] recliner leads to swelling of the legs and/or potential wounds on your backside. Additional Orders / Instructions: Follow Nutritious Diet and Increase Protein Intake Medications-Please add to medication list.: P.O. Antibiotics - Continue antibiotics as prescribed until completed. WOUND #19: - Lower Leg Wound Laterality: Left, Medial Prim Dressing: Hydrofera Blue Ready Transfer Foam, 2.5x2.5 (in/in) 1 x Per Day/30 Days ary Discharge Instructions: Apply Hydrofera Blue Ready to wound bed as directed Secondary Dressing: ABD Pad 5x9 (in/in) 1 x Per Day/30 Days Discharge Instructions: Cover with ABD pad Com pression Wrap: 3-LAYER WRAP - Profore Lite LF 3 Multilayer Compression Bandaging System 1 x Per Day/30 Days Discharge Instructions: Apply 3 multi-layer wrap as prescribed. WOUND #9: - Lower Leg Wound Laterality: Left, Lateral Prim Dressing: Hydrofera Blue Ready Transfer Foam, 2.5x2.5 (in/in) 1 x Per Day/30 Days ary Discharge Instructions: Apply Hydrofera Blue Ready to wound bed as directed Secondary Dressing: ABD Pad 5x9 (in/in) 1 x Per Day/30 Days Discharge Instructions: Cover with ABD pad Com pression Wrap: 3-LAYER WRAP - Profore Lite LF 3 Multilayer Compression Bandaging System 1 x Per  Day/30 Days Discharge Instructions: Apply 3 multi-layer wrap as prescribed. 1. Following debridement today I feel like we are on the right track I would recommend that we continue with the compression wrap as before and the patient should also continue with the [facility] as before. 2. I am also going to recommend that we have the patient continue to elevate his legs much as possible obviously I think that still good to be helpful. 3. I would also suggest that he continue to be monitored for any signs of other suspicious areas or lesions obviously if anything changes or if anything is noted they should let me know soon as possible. We will see patient back for reevaluation in 1 week here in the clinic. If anything worsens or changes patient will contact our office for additional recommendations. Electronic Signature(s) Signed: 11/12/2022 6:09:48 PM By: [name] PA-C Previous Signature: 11/12/2022 6:08:16 PM Version By: [name] PA-C Entered By: [name] on 11/12/2022 18:09:48 -------------------------------------------------------------------------------- SuperBill Details Patient Name: Date of Service: [name] [name] 11/12/2022 Medical Record Number: [id number] Patient Account Number: [id number] Date of Birth/Sex: Treating RN: 07/21/1946 (77 y.o. 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 L97.822 Non-pressure chronic ulcer of other part of left lower leg with fat layer exposed L97.812 Non-pressure chronic ulcer of other part of right lower leg with fat layer exposed I73.89 Other specified peripheral vascular diseases I48.0 Paroxysmal atrial fibrillation I10 Essential (primary)  hypertension KYNDAL, GLOSTER [name] ([id number]) [id number] Page 12 of 12 Facility Procedures : CPT4 Code: 62836629 Description: 47654 - WOUND CARE VISIT-LEV 4 EST PT Modifier: Quantity: 1 : CPT4 Code: 65035465 Description: (Facility Use Only) 68127NT - APPLY MULTLAY COMPRS LWR LT LEG Modifier: Quantity: 1 Electronic Signature(s) Signed: 11/12/2022 4:47:59 PM By: [name] MSN RN CNS WTA Previous Signature: 11/12/2022 8:06:43 AM Version By: [name] MSN RN CNS WTA Entered By: [name] on 11/12/2022 08:21:34

## 2022-11-15 ENCOUNTER — Ambulatory Visit: Payer: Medicare PPO | Admitting: Cardiology

## 2022-11-16 DIAGNOSIS — M7022 Olecranon bursitis, left elbow: Secondary | ICD-10-CM | POA: Diagnosis not present

## 2022-11-20 ENCOUNTER — Encounter: Payer: Medicare PPO | Admitting: Physician Assistant

## 2022-11-20 DIAGNOSIS — I480 Paroxysmal atrial fibrillation: Secondary | ICD-10-CM | POA: Diagnosis not present

## 2022-11-20 DIAGNOSIS — I872 Venous insufficiency (chronic) (peripheral): Secondary | ICD-10-CM | POA: Diagnosis not present

## 2022-11-20 DIAGNOSIS — I10 Essential (primary) hypertension: Secondary | ICD-10-CM | POA: Diagnosis not present

## 2022-11-20 DIAGNOSIS — L97812 Non-pressure chronic ulcer of other part of right lower leg with fat layer exposed: Secondary | ICD-10-CM | POA: Diagnosis not present

## 2022-11-20 DIAGNOSIS — I739 Peripheral vascular disease, unspecified: Secondary | ICD-10-CM | POA: Diagnosis not present

## 2022-11-20 DIAGNOSIS — Z7901 Long term (current) use of anticoagulants: Secondary | ICD-10-CM | POA: Diagnosis not present

## 2022-11-20 DIAGNOSIS — L97822 Non-pressure chronic ulcer of other part of left lower leg with fat layer exposed: Secondary | ICD-10-CM | POA: Diagnosis not present

## 2022-11-20 DIAGNOSIS — I87333 Chronic venous hypertension (idiopathic) with ulcer and inflammation of bilateral lower extremity: Secondary | ICD-10-CM | POA: Diagnosis not present

## 2022-11-20 DIAGNOSIS — I428 Other cardiomyopathies: Secondary | ICD-10-CM | POA: Diagnosis not present

## 2022-11-20 NOTE — Progress Notes (Signed)
AGGIE, HOUSEHOLDER R ([id number]) [id number] Page 1 of 12 Visit Report for 11/20/2022 Chief Complaint Document Details Patient Name: Date of Service: [name] [name] Scot R. 11/20/2022 9:30 A M Medical Record Number: [id number] Patient Account Number: [id number] Date of Birth/Sex: Treating RN: 1946/08/15 (77 y.o. [name] Primary Care Provider: [name] Other Clinician: Referring Provider: Treating Provider/Extender: [name] in Treatment: 15 Information Obtained from: Patient Chief Complaint Bilateral LE Ulcers Electronic Signature(s) Signed: 11/20/2022 9:26:57 AM By: [name] PA-C Entered By: [name] on 11/20/2022 09:26:57 -------------------------------------------------------------------------------- Debridement Details Patient Name: Date of Service: [name] [name], [name] 11/20/2022 9:30 A M Medical Record Number: [id number] Patient Account Number: [id number] Date of Birth/Sex: Treating RN: [birth year] (77 y.o. [name] Primary Care Provider: [name] Other Clinician: Referring Provider: Treating Provider/Extender: [name] in Treatment: 15 Debridement Performed for Assessment: Wound #9 Left,Lateral Lower Leg Performed By: Physician Tommie Sams., PA-C Debridement Type: Debridement Severity of Tissue Pre Debridement: Fat layer exposed Level of Consciousness (Pre-procedure): Awake and Alert Pre-procedure Verification/Time Out No Taken: Start Time: 10:17 T Area Debrided (L x W): otal 1 (cm) x 0.7 (cm) = 0.7 (cm) Tissue and other material debrided: Viable, Non-Viable, Slough, Subcutaneous, Skin: Dermis , Skin: Epidermis, Slough Level: Skin/Subcutaneous Tissue Debridement Description: Excisional Instrument: Curette Bleeding: Minimum Hemostasis Achieved: Pressure Response to Treatment: Procedure was tolerated well Level of Consciousness (Post- Awake and  Alert procedure): MICKAL, BUSSEN R ([id number]) [id number] Page 2 of 12 Post Debridement Measurements of Total Wound Length: (cm) 1 Width: (cm) 0.7 Depth: (cm) 0.2 Volume: (cm) 0.11 Character of Wound/Ulcer Post Debridement: Stable Severity of Tissue Post Debridement: Fat layer exposed Post Procedure Diagnosis Same as Pre-procedure Electronic Signature(s) Unsigned Entered By: [name] on 11/20/2022 10:18:31 -------------------------------------------------------------------------------- Debridement Details Patient Name: Date of Service: [name] [name] Scot R. 11/20/2022 9:30 A M Medical Record Number: [id number] Patient Account Number: [id number] Date of Birth/Sex: Treating RN: 1946-07-28 (77 y.o. [name] Primary Care Provider: [name] Other Clinician: Referring Provider: Treating Provider/Extender: [name] in Treatment: 15 Debridement Performed for Assessment: Wound #19 Left,Medial Lower Leg Performed By: Physician Tommie Sams., PA-C Debridement Type: Debridement Level of Consciousness (Pre-procedure): Awake and Alert Pre-procedure Verification/Time Out No Taken: Start Time: 10:17 T Area Debrided (L x W): otal 1.7 (cm) x 1.3 (cm) = 2.21 (cm) Tissue and other material debrided: Viable, Non-Viable, Slough, Subcutaneous, Skin: Dermis , Skin: Epidermis, Slough Level: Skin/Subcutaneous Tissue Debridement Description: Excisional Instrument: Curette Bleeding: Minimum Hemostasis Achieved: Silver Nitrate Response to Treatment: Procedure was tolerated well Level of Consciousness (Post- Awake and Alert procedure): Post Debridement Measurements of Total Wound Length: (cm) 1.7 Width: (cm) 1.3 Depth: (cm) 0.2 Volume: (cm) 0.347 Character of Wound/Ulcer Post Debridement: Stable Post Procedure Diagnosis Same as Pre-procedure Electronic Signature(s) Unsigned Entered By: [name] on 11/20/2022  10:20:00 Signature(s): NUNCIO, RAHILL R ([id number]) [id number]_ Date(s): [id number] Page 3 of 12 -------------------------------------------------------------------------------- HPI Details Patient Name: Date of Service: [name] [name] Scot R. 11/20/2022 9:30 A M Medical Record Number: [id number] Patient Account Number: [id number] Date of Birth/Sex: Treating RN: Aug 02, 1946 (77 y.o. 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 the patient has had a vascular workup over 7 years ago and said everything was normal at that stage. He does not have any chronic problems except for cardiac issues which he sees a cardiologist in [place]. 08/15/2017 -- arterial and venous duplex studies still pending. 08/23/2017 -- venous reflux studies done on 08/13/2017 shows venous incompetence throughout the left lower extremity deep system and focally at the left saphenofemoral junction. 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 No evidence of SVT or DVT in bilateral lower extremities The patient has an appointment at the end of the month to get his arterial duplex study done 09/05/2017 -- the patient was seen at the vein and vascular office yesterday by Angelena Form. ABI studies were notable for medial calcification and the toe brachial indices were normal and bilateral ankle-brachial) waveforms were normal with triphasic  flow. After review of his venous studies he was not a candidate for laser ablation and his lymphedema was to be treated with compression stockings and lymphedema pump pumps 09/12/2017 -- had a low arterial study done at the [facility] -- unable to obtain reliable ABI is due to medial calcification. Bilateral toe indices were normal with the right being 1.01 and the left being 0.92 and the waveforms were triphasic bilaterally. he did get hold of 30-40 mm compression stockings but is unable to put these on. We will try and get him alternative compression stockings. 09/26/17- he is here in follow up evaluation of a right lower extremity ulcer;he is compliant in wearing compression stocking; ulcer almost epithelialized , anticipate healing next appointment Readmission: 11/17 point upon evaluation patient's wound currently that he is seeing [place] for today is a skin cancerous lesion that was cleared away by his dermatologist on the left medial calf region. He tells me that this is a very similar thing to what he had done previously in fact the last time he saw him in 2018 this was also what was going on at that point. Nonetheless he feels that based on what he seeing currently that this is just having a lot of harder time healing although it is much closer to the surface than what he is experienced in the past. He notes that the initial removal was in June 2022 which was this year this is now November and still has not closed. He does have some edema and definitely I think that there is some venous component to his slow healing here. Also think that we can do something better than Vaseline to try to help with getting this to clear up as quickly as possible. He does have a history of atrial fibrillation and is on Eliquis otherwise he really has no major medical problems that would affect wound healing. 09/07/2021 upon evaluation today patient actually appears to be doing significantly  better after having wrapped him last week. Overall I think that this is making significant improvements at this time which is great news. I do not see any evidence of infection which is great news as well. No fevers, chills, nausea, vomiting, or diarrhea. 09/14/2021 upon evaluation today patient appears to be doing well with regard to his leg ulcer. He has been tolerating the dressing changes and overall I think that he is making excellent progress. I do not see any signs of active infection at this  time. 09/21/2021 upon evaluation today patient actually appears to be making good progress with regard to his wound this is again measuring smaller today no debridement seems to be necessary. We have been using a silver collagen dressing and I think that is doing an awesome job. 09/28/2021 upon evaluation today patient appears to be doing well with regard to his leg currently. I do not see any signs of active infection at this time which is great news. No fevers, chills, nausea, vomiting, or diarrhea. I think this wound is very close to complete resolution. 10/12/2021 upon evaluation today patient actually appears to be doing awesome in regard to his leg ulcer. In fact this appears to be completely healed based on what I am seeing currently. I do not see any evidence of active infection locally nor systemically at this time which is also great news. No fevers, chills, nausea, vomiting, or diarrhea. Readmission: 12/07/2021 upon evaluation today patient presents for readmission here in the clinic. He was discharged on 10/12/2021 is completely healed. Unfortunately this has reopened at this point and he is having continual issues with new blisters over both lower extremities. This is even worse than what we previously saw. Nonetheless we did actually check his ABIs today and it did reveal that his ABIs were 0.55 on the left and 0.57 on the right. Subsequently this is a definite change from his last arterial study  which showed that he did have good blood flow at 1.01 on the right and 0.92 on the left and that was right at the beginning of 2019. Nonetheless based on what we see currently I do think he tolerated the 3 layer compression wrap but I do believe that we probably need to get him tested for his arterial flow in order to see where things stand and if there is something we can do there that would help prevent this from continue to be an ongoing issue. He did not utilize compression socks in the interim from when he was last here till this time. That something is probably going to need lifelong going forward as well. ABHIMANYU, STRZELCZYK R ([id number]) [id number] Page 4 of 12 3/9; patient presents for follow-up. He has no issues or complaints today. He tolerated the compression wrap well. He had ABIs with TBI's done. He denies signs of infection. 12/21/2021 upon evaluation today patient appears to be doing well with regard to the wounds on his legs. Both are showing signs of significant improvement which is great news although I do believe some sharp debridement would be of benefit here as well. 12/28/2021 upon evaluation today patient appears to be doing well with regard to his wounds. Everything is showing signs of excellent improvement which I am very pleased about. I think that we are headed in the right direction here. Fortunately there does not appear to be any evidence of infection which is great news there is a little bit of hypergranulation. 01/04/2022 upon evaluation today patient appears to be doing well with regard to his wounds 2 of them are healed 1 is almost so and the other 1 is significantly better. Overall I am extremely pleased with where we stand and I think that he is making excellent progress here. I do not see any evidence of active infection locally nor systemically at this time. 01-16-2022 upon evaluation today patient's wound on the left leg is showing signs of  doing quite well. Has not completely cleared at this point but it is much improved. Fortunately I  do not see any signs of infection at this time. No fevers, chills, nausea, vomiting, or diarrhea. 01-23-2022 upon evaluation today patient's wound of the left leg actually appears to be pretty much completely healed which is great news. I do not see any signs of active infection locally or systemically which is excellent. With that being said on the right leg what wound is measuring smaller the other 1 is a new wound that just showed up fortunately its not too bad. Has been using Xeroform here and that seems to be doing decently well which is great news. Unfortunately his blood pressure is significantly high we gave him the readings for the past 4-5 visits as well as a recommendation to make an appointment to go discuss this with his primary care provider patient states that he is going to look into doing this. 01-30-2022 upon evaluation today patient appears to be doing well with regard to his left leg everything appears to be healed. On the right leg the more anterior wound is healed the more medial wound that I been concerned about a possible skin cancer unfortunately still does not look great to me. I do believe that we should probably do a biopsy I have talked about it with him a few times I think though it is probably time to go ahead and do this at this point. 02-09-2022 upon evaluation today patient appears to be doing well with regard to his legs. On the left this appears to be completely healed. On the right he does have 2 areas and be perfectly honest one of them is a skin cancer that he is going to the Mohs surgery clinic for the other seems to be healing nicely. Readmission: 08-02-2022 upon evaluation today patient appears for reevaluation here in our clinic concerning issues that he has been having with wounds over the bilateral lower extremities. I last saw him in May 2023 and at that point we  had him completely healed. Unfortunately he is tells me this has broken down to some degree since that point. 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 He tells me this 1 seems to just be getting bigger and bigger not improving. Fortunately he is not having any significant pain which is good news he does have quite a bit of swelling and he tells me that his fluid pills are not recommended for him to take daily but just in 3-day intervals here and there. 08-09-2022 upon evaluation today patient appears to be doing still somewhat poorly in regard to his legs although in general he does not appear to be feeling as good as he has been. Fortunately there does not appear to be any signs of infection which is good news. With that being said he is having some issues here with having and overall poor feeling in general which again is good I think going to be the biggest complicating factor. He actually seems to be coughing I do not hear any wheezing right now I did listen to his chest he did not have good airflow down low however makes me suspicious for bronchitis or even possibly pneumonia which could be part of what is going on here as well. Fortunately I do not see any evidence of anything worsening in regard to his  legs but I definitely believe that he needs to continue with the compression wraps he took them off yesterday to shower has not had anything on for 24 hours that is why his legs are so swollen today. With regard to his pathology report I did review that it showed some squamous abnormality but no signs of distinct carcinoma. With that being said it was saying that it could be adjacent to a squamous cell carcinoma nonetheless my suggestion is can be that we have the patient take copy of this report  and give it to his Mohs surgeon in order for them to see if there is anything they feel like needs to be done further. With that being said right now I feel like the primary thing is going to be for [place] to try to get his swelling down and keep that down into that hand since he is having so much drainage I believe we can have to bring him in for dressing changes twice a week doing a nurse visit on Mondays. 11/9; since the patient was last here he spent the night in the emergency room he received IV Lasix. Also received antibiotics although he was not discharged on either 1 of these. He also saw his cardiology office who put him on regular Lasix 20 mg [previously on as needed Lasix 20 mg]. Per our intake nurse the swelling in his legs is remarkably better but he still has bilateral lower extremity wounds. He still has wounds on the bilateral lower extremities most problematically on the left lateral calf. He has been using silver alginate under 3 layer compression. 08-23-2022 upon evaluation today patient appears to be doing much better than the last time I saw him 2 weeks ago. At that point I was very concerned about how he was doing he did see Dr. Caryn Section his primary care provider they got him on some blood pressure medication in general his color and overall appearance looks to be doing much improved compared to the last time I saw him. 09-04-2022 upon evaluation today patient appears to be doing well currently in regard to his wounds. Everything is showing signs of improvement which is great news. Fortunately there does not appear to be any signs of active infection locally or systemically at this time. No fevers, chills, nausea, vomiting, or diarrhea. 09-10-2022 upon evaluation today patient appears to be doing better in regard to his wounds although the [facility] was extremely stuck to the wound bed. Fortunately there does not appear to be any signs of infection locally or systemically at this  time which is great news. No fevers, chills, nausea, vomiting, or diarrhea. 09-17-2022 upon evaluation today patient appears to be doing well currently in regard to his wounds in general. The right leg actually showing signs of excellent improvement and very pleased with where things stand in that regard. Fortunately I do not see any evidence of infection locally or systemically at this time which is great news. No fevers, chills, nausea, vomiting, or diarrhea. 09-24-2022 upon evaluation today patient appears to be doing well currently in regard to his wounds. Things look to be doing quite well. With that being said he did have a result unfortunately on the pathology which showed that he did have a squamous cell carcinoma noted on the biopsy sample I sent last week. He is seeing his dermatologist tomorrow in that regard. With that being said other than that however he seems to really be making some pretty good progress here  which is good news. No fevers, chills, nausea, vomiting, or diarrhea. 12/26; the patient has 2 open wounds remaining on the left leg. One is on the left anterior mid tibia and the other is on the right lateral knee just outside of the popliteal fossa. 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 The patient has been to see dermatology Dr. Evorn Gong who apparently is making him a referral to the [facility] surgery center. He does not yet have an appointment 10-12-2022 upon evaluation today patient appears to be doing well currently in regard to his wound. He has been tolerating the dressing changes without complication and overall feel like we are headed in the right direction. Fortunately I do not see any signs of infection locally or systemically at this time which is great news. No fevers, chills, nausea, vomiting, or diarrhea. 10-23-2022 upon evaluation today patient appears to be doing well currently in regard to his wound. He has been  tolerating the dressing changes without complication. Fortunately there does not appear to be any signs of active infection locally nor systemically which is great news and overall I am extremely pleased with where we stand currently. No fevers, chills, nausea, vomiting, or diarrhea. ABHILASH, EMENS R ([id number]) [id number] Page 5 of 12 10-26-2022 upon evaluation today patient appears to be doing well currently in regard to his wounds. Everything is showing signs of improvement and this is great news. Fortunately I see no evidence of active infection systemically. He does seem to be doing much better in regard to the local infection in regard to his leg. The smell is also greatly improved. Overall I am extremely happy with where we stand today. This is after just a few days with the antibiotic on board. 11-05-2022 upon evaluation today patient appears to be doing well currently in regard to his wounds although the wound where they performed the Mohs surgery does look a little bit hyper granulated I think switching to [facility] may be better for him. He voiced understanding. Fortunately there does not appear to be any evidence of active infection locally nor systemically at this time. 11-12-2022 upon evaluation today patient appears to be doing better in regard to both wounds he has been tolerating the dressing changes without complication. There is no signs of infection and in general I think you are doing quite well. No fevers, chills, nausea, vomiting, or diarrhea. 11-20-2022 upon evaluation today patient appears to be doing well currently in regard to his wounds. He has been tolerating the dressing changes without complication. Fortunately there does not appear to be any signs of infection at this time. No fevers, chills, nausea, vomiting, or diarrhea. Electronic Signature(s) Signed: 11/20/2022 10:26:06 AM By: [name] PA-C Entered By: [name] on  11/20/2022 10:26:06 -------------------------------------------------------------------------------- Physical Exam Details Patient Name: Date of Service: [name] [name], [name] 11/20/2022 9:30 A M Medical Record Number: [id number] Patient Account Number: [id number] Date of Birth/Sex: Treating RN: [birth year] (77 y.o. [name] Primary Care Provider: [name] Other Clinician: Referring Provider: Treating Provider/Extender: [name] in Treatment: 37 Constitutional Well-nourished and well-hydrated in no acute distress. Respiratory normal breathing without difficulty. Psychiatric this patient is able to make decisions and demonstrates good insight into disease process. Alert and Oriented x 3. pleasant and cooperative. Notes Upon inspection patient's wound bed actually showed signs of good granulation epithelization at this point. Fortunately I do not see any evidence of infection locally  nor systemically which is great news I think he is doing well I did perform debridement regard to both wounds postdebridement this is significantly improved and overall I am extremely pleased with where we are today. Electronic Signature(s) Signed: 11/20/2022 10:26:51 AM By: [name] PA-C Entered By: [name] on 11/20/2022 10:26:50 -------------------------------------------------------------------------------- Physician Orders Details Patient Name: Date of Service: [name] Montgomery, [name] 11/20/2022 9:30 A M Medical Record Number: [id number] Patient Account Number: [id number] ARVELL, MALTEZ ([id number]) [phone].pdf Page 6 of 12 Date of Birth/Sex: Treating RN: 21-Feb-1946 (77 y.o. [name] Primary Care Provider: Other Clinician: [name] Referring Provider: Treating Provider/Extender: [name] in Treatment: 63 Verbal / Phone Orders: No Diagnosis Coding ICD-10 Coding Code Description [id number]  Chronic venous hypertension (idiopathic) with ulcer and inflammation of bilateral lower extremity L97.822 Non-pressure chronic ulcer of other part of left lower leg with fat layer exposed L97.812 Non-pressure chronic ulcer of other part of right lower leg with fat layer exposed I73.89 Other specified peripheral vascular diseases I48.0 Paroxysmal atrial fibrillation I10 Essential (primary) hypertension Follow-up Appointments Return Appointment in 1 week. Nurse Visit as needed Bathing/ Shower/ Hygiene May shower with wound dressing protected with water repellent cover or cast protector. No tub bath. Anesthetic (Use 'Patient Medications' Section for Anesthetic Order Entry) Wound #9 Left,Lateral Lower Leg Lidocaine applied to wound bed Edema Control - Lymphedema / Segmental Compressive Device / Other Bilateral Lower Extremities 3 Layer Compression System for Lymphedema. - Left leg Patient to wear own compression stockings. Remove compression stockings every night before going to bed and put on every morning when getting up. - right lower leg Elevate, Exercise Daily and A void Standing for Long Periods of Time. Elevate legs to the level of the heart and pump ankles as often as possible Elevate leg(s) parallel to the floor when sitting. DO YOUR BEST to sleep in the bed at night. DO NOT sleep in your recliner. Long hours of sitting in a recliner leads to swelling of the legs and/or potential wounds on your backside. Additional Orders / Instructions Follow Nutritious Diet and Increase Protein Intake Medications-Please add to medication list. ntibiotics - Continue antibiotics as prescribed until completed. P.O. A Wound Treatment Wound #19 - Lower Leg Wound Laterality: Left, Medial Prim Dressing: Hydrofera Blue Ready Transfer Foam, 2.5x2.5 (in/in) 1 x Per Day/30 Days ary Discharge Instructions: Apply Hydrofera Blue Ready to wound bed as directed Secondary Dressing: ABD Pad 5x9 (in/in) 1 x  Per Day/30 Days Discharge Instructions: Cover with ABD pad Compression Wrap: 3-LAYER WRAP - Profore Lite LF 3 Multilayer Compression Bandaging System 1 x Per Day/30 Days Discharge Instructions: Apply 3 multi-layer wrap as prescribed. Wound #9 - Lower Leg Wound Laterality: Left, Lateral Prim Dressing: Hydrofera Blue Ready Transfer Foam, 2.5x2.5 (in/in) 1 x Per Day/30 Days ary Discharge Instructions: Apply Hydrofera Blue Ready to wound bed as directed Secondary Dressing: ABD Pad 5x9 (in/in) 1 x Per Day/30 Days Discharge Instructions: Cover with ABD pad Compression Wrap: 3-LAYER WRAP - Profore Lite LF 3 Multilayer Compression Bandaging System 1 x Per Day/30 Days Discharge Instructions: Apply 3 multi-layer wrap as prescribed. Electronic Signature(s) Unsigned Entered By: [name] on 11/20/2022 10:20:50 Signature(s): HARVIS, RATZLAFF R ([id number]) [id number] Date(s): [phone].pdf Page 7 of 12 -------------------------------------------------------------------------------- Problem List Details Patient Name: Date of Service: [name] [name] Scot R. 11/20/2022 9:30 A M Medical Record Number: [id number] Patient Account Number: [id number] Date of Birth/Sex: Treating RN: 1946/03/30 (77 y.o.  [name] Primary Care Provider: [name] Other Clinician: Referring Provider: Treating Provider/Extender: [name] in Treatment: 15 Active Problems ICD-10 Encounter Code Description Active Date MDM Diagnosis [phone] Chronic venous hypertension (idiopathic) with ulcer and inflammation of 08/02/2022 No Yes bilateral lower extremity L97.822 Non-pressure chronic ulcer of other part of left lower leg with fat layer 08/02/2022 No Yes exposed L97.812 Non-pressure chronic ulcer of other part of right lower leg with fat layer 08/02/2022 No Yes exposed I73.89 Other specified peripheral vascular diseases 08/02/2022 No Yes I48.0 Paroxysmal atrial fibrillation  08/02/2022 No Yes I10 Essential (primary) hypertension 08/02/2022 No Yes Inactive Problems Resolved Problems Electronic Signature(s) Signed: 11/20/2022 9:26:53 AM By: [name] PA-C Entered By: [name] on 11/20/2022 09:26:53 BRODEN, MAZIN R ([id number]) [id number] Page 8 of 12 -------------------------------------------------------------------------------- Progress Note Details Patient Name: Date of Service: [name] [name] Scot R. 11/20/2022 9:30 A M Medical Record Number: [id number] Patient Account Number: [id number] Date of Birth/Sex: Treating RN: May 18, 1946 (77 y.o. 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 the patient has had a vascular workup over 7 years ago and said everything was normal at that stage. He does not have any chronic problems except for cardiac issues which he sees a cardiologist in [place]. 08/15/2017 -- arterial and venous duplex studies still pending. 08/23/2017 -- venous reflux studies done on 08/13/2017 shows venous incompetence throughout the left lower extremity deep system and focally at the left saphenofemoral junction. 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 No evidence of SVT or DVT in bilateral lower extremities The patient has an appointment at the end of the month to  get his arterial duplex study done 09/05/2017 -- the patient was seen at the vein and vascular office yesterday by Angelena Form. ABI studies were notable for medial calcification and the toe brachial indices were normal and bilateral ankle-brachial) waveforms were normal with triphasic flow. After review of his venous studies he was not a candidate for laser ablation and his lymphedema was to be treated with compression stockings and lymphedema pump pumps 09/12/2017 -- had a low arterial study done at the [facility] -- unable to obtain reliable ABI is due to medial calcification. Bilateral toe indices were normal with the right being 1.01 and the left being 0.92 and the waveforms were triphasic bilaterally. he did get hold of 30-40 mm compression stockings but is unable to put these on. We will try and get him alternative compression stockings. 09/26/17- he is here in follow up evaluation of a right lower extremity ulcer;he is compliant in wearing compression stocking; ulcer almost epithelialized , anticipate healing next appointment Readmission: 11/17 point upon evaluation patient's wound currently that he is seeing [place] for today is a skin cancerous lesion that was cleared away by his dermatologist on the left medial calf region. He tells me that this is a very similar thing to what he had done previously in fact the last time he saw him in 2018 this was also what  was going on at that point. Nonetheless he feels that based on what he seeing currently that this is just having a lot of harder time healing although it is much closer to the surface than what he is experienced in the past. He notes that the initial removal was in June 2022 which was this year this is now November and still has not closed. He does have some edema and definitely I think that there is some venous component to his slow healing here. Also think that we can do something better than Vaseline to try to  help with getting this to clear up as quickly as possible. He does have a history of atrial fibrillation and is on Eliquis otherwise he really has no major medical problems that would affect wound healing. 09/07/2021 upon evaluation today patient actually appears to be doing significantly better after having wrapped him last week. Overall I think that this is making significant improvements at this time which is great news. I do not see any evidence of infection which is great news as well. No fevers, chills, nausea, vomiting, or diarrhea. 09/14/2021 upon evaluation today patient appears to be doing well with regard to his leg ulcer. He has been tolerating the dressing changes and overall I think that he is making excellent progress. I do not see any signs of active infection at this time. 09/21/2021 upon evaluation today patient actually appears to be making good progress with regard to his wound this is again measuring smaller today no debridement seems to be necessary. We have been using a silver collagen dressing and I think that is doing an awesome job. 09/28/2021 upon evaluation today patient appears to be doing well with regard to his leg currently. I do not see any signs of active infection at this time which is great news. No fevers, chills, nausea, vomiting, or diarrhea. I think this wound is very close to complete resolution. 10/12/2021 upon evaluation today patient actually appears to be doing awesome in regard to his leg ulcer. In fact this appears to be completely healed based on what I am seeing currently. I do not see any evidence of active infection locally nor systemically at this time which is also great news. No fevers, chills, nausea, vomiting, or diarrhea. Readmission: 12/07/2021 upon evaluation today patient presents for readmission here in the clinic. He was discharged on 10/12/2021 is completely healed. Unfortunately this has reopened at this point and he is having continual issues  with new blisters over both lower extremities. This is even worse than what we previously saw. Nonetheless we did actually check his ABIs today and it did reveal that his ABIs were 0.55 on the left and 0.57 on the right. Subsequently this is a definite change from his last arterial study which showed that he did have good blood flow at 1.01 on the right and 0.92 on the left and that was right at the beginning of 2019. Nonetheless based on what we see currently I do think he tolerated the 3 layer compression wrap but I do believe that we probably need to get him tested for his arterial flow in order to see where things stand and if there is something we can do there that would help prevent this from continue to be an JVON, TRUSSEL R ([id number]) [id number] Page 9 of 12 ongoing issue. He did not utilize compression socks in the interim from when he was last here till this time. That something is probably going to need  lifelong going forward as well. 3/9; patient presents for follow-up. He has no issues or complaints today. He tolerated the compression wrap well. He had ABIs with TBI's done. He denies signs of infection. 12/21/2021 upon evaluation today patient appears to be doing well with regard to the wounds on his legs. Both are showing signs of significant improvement which is great news although I do believe some sharp debridement would be of benefit here as well. 12/28/2021 upon evaluation today patient appears to be doing well with regard to his wounds. Everything is showing signs of excellent improvement which I am very pleased about. I think that we are headed in the right direction here. Fortunately there does not appear to be any evidence of infection which is great news there is a little bit of hypergranulation. 01/04/2022 upon evaluation today patient appears to be doing well with regard to his wounds 2 of them are healed 1 is almost so and the other 1 is  significantly better. Overall I am extremely pleased with where we stand and I think that he is making excellent progress here. I do not see any evidence of active infection locally nor systemically at this time. 01-16-2022 upon evaluation today patient's wound on the left leg is showing signs of doing quite well. Has not completely cleared at this point but it is much improved. Fortunately I do not see any signs of infection at this time. No fevers, chills, nausea, vomiting, or diarrhea. 01-23-2022 upon evaluation today patient's wound of the left leg actually appears to be pretty much completely healed which is great news. I do not see any signs of active infection locally or systemically which is excellent. With that being said on the right leg what wound is measuring smaller the other 1 is a new wound that just showed up fortunately its not too bad. Has been using Xeroform here and that seems to be doing decently well which is great news. Unfortunately his blood pressure is significantly high we gave him the readings for the past 4-5 visits as well as a recommendation to make an appointment to go discuss this with his primary care provider patient states that he is going to look into doing this. 01-30-2022 upon evaluation today patient appears to be doing well with regard to his left leg everything appears to be healed. On the right leg the more anterior wound is healed the more medial wound that I been concerned about a possible skin cancer unfortunately still does not look great to me. I do believe that we should probably do a biopsy I have talked about it with him a few times I think though it is probably time to go ahead and do this at this point. 02-09-2022 upon evaluation today patient appears to be doing well with regard to his legs. On the left this appears to be completely healed. On the right he does have 2 areas and be perfectly honest one of them is a skin cancer that he is going to the Mohs  surgery clinic for the other seems to be healing nicely. Readmission: 08-02-2022 upon evaluation today patient appears for reevaluation here in our clinic concerning issues that he has been having with wounds over the bilateral lower extremities. I last saw him in May 2023 and at that point we had him completely healed. Unfortunately he is tells me this has broken down to some degree since that point. 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 He tells me this 1 seems to just be getting bigger and bigger not improving. Fortunately he is not having any significant pain which is good news he does have quite a bit of swelling and he tells me that his fluid pills are not recommended for him to take daily but just in 3-day intervals here and there. 08-09-2022 upon evaluation today patient appears to be doing still somewhat poorly in regard to his legs although in general he does not appear to be feeling as good as he has been. Fortunately there does not appear to be any signs of infection which is good news. With that being said he is having some issues here with having and overall poor feeling in general which again is good I think going to be the biggest complicating factor. He actually seems to be coughing I do not hear any wheezing right now I did listen to his chest he did not have good airflow down low however makes me suspicious for bronchitis or even possibly pneumonia which could be part of what is going on here as well. Fortunately I do not see any evidence of anything worsening in regard to his legs but I definitely believe that he needs to continue with the compression wraps he took them off yesterday to shower has not had anything on for 24 hours that is why his legs are so swollen today. With  regard to his pathology report I did review that it showed some squamous abnormality but no signs of distinct carcinoma. With that being said it was saying that it could be adjacent to a squamous cell carcinoma nonetheless my suggestion is can be that we have the patient take copy of this report and give it to his Mohs surgeon in order for them to see if there is anything they feel like needs to be done further. With that being said right now I feel like the primary thing is going to be for [place] to try to get his swelling down and keep that down into that hand since he is having so much drainage I believe we can have to bring him in for dressing changes twice a week doing a nurse visit on Mondays. 11/9; since the patient was last here he spent the night in the emergency room he received IV Lasix. Also received antibiotics although he was not discharged on either 1 of these. He also saw his cardiology office who put him on regular Lasix 20 mg [previously on as needed Lasix 20 mg]. Per our intake nurse the swelling in his legs is remarkably better but he still has bilateral lower extremity wounds. He still has wounds on the bilateral lower extremities most problematically on the left lateral calf. He has been using silver alginate under 3 layer compression. 08-23-2022 upon evaluation today patient appears to be doing much better than the last time I saw him 2 weeks ago. At that point I was very concerned about how he was doing he did see Dr. Caryn Section his primary care provider they got him on some blood pressure medication in general his color and overall appearance looks to be doing much improved compared to the last time I saw him. 09-04-2022 upon evaluation today patient appears to be doing well currently in regard to his wounds. Everything  is showing signs of improvement which is great news. Fortunately there does not appear to be any signs of active infection locally or systemically at this time. No  fevers, chills, nausea, vomiting, or diarrhea. 09-10-2022 upon evaluation today patient appears to be doing better in regard to his wounds although the [facility] was extremely stuck to the wound bed. Fortunately there does not appear to be any signs of infection locally or systemically at this time which is great news. No fevers, chills, nausea, vomiting, or diarrhea. 09-17-2022 upon evaluation today patient appears to be doing well currently in regard to his wounds in general. The right leg actually showing signs of excellent improvement and very pleased with where things stand in that regard. Fortunately I do not see any evidence of infection locally or systemically at this time which is great news. No fevers, chills, nausea, vomiting, or diarrhea. 09-24-2022 upon evaluation today patient appears to be doing well currently in regard to his wounds. Things look to be doing quite well. With that being said he did have a result unfortunately on the pathology which showed that he did have a squamous cell carcinoma noted on the biopsy sample I sent last week. He is seeing his dermatologist tomorrow in that regard. With that being said other than that however he seems to really be making some pretty good progress here which is good news. No fevers, chills, nausea, vomiting, or diarrhea. 12/26; the patient has 2 open wounds remaining on the left leg. One is on the left anterior mid tibia and the other is on the right lateral knee just outside of the popliteal fossa. 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 The patient has been to see dermatology Dr. Evorn Gong who apparently is making him a referral to the [facility]s surgery center. He does not yet have an appointment 10-12-2022 upon evaluation today patient appears to be doing well currently in regard to his wound. He has been tolerating the dressing changes without complication and overall feel like we are headed  in the right direction. Fortunately I do not see any signs of infection locally or systemically at this time which is great news. No fevers, chills, nausea, vomiting, or diarrhea. 10-23-2022 upon evaluation today patient appears to be doing well currently in regard to his wound. He has been tolerating the dressing changes without SANTHOSH, RANFT ([id number]) [phone].pdf Page 10 of 12 complication. Fortunately there does not appear to be any signs of active infection locally nor systemically which is great news and overall I am extremely pleased with where we stand currently. No fevers, chills, nausea, vomiting, or diarrhea. 10-26-2022 upon evaluation today patient appears to be doing well currently in regard to his wounds. Everything is showing signs of improvement and this is great news. Fortunately I see no evidence of active infection systemically. He does seem to be doing much better in regard to the local infection in regard to his leg. The smell is also greatly improved. Overall I am extremely happy with where we stand today. This is after just a few days with the antibiotic on board. 11-05-2022 upon evaluation today patient appears to be doing well currently in regard to his wounds although the wound where they performed the Mohs surgery does look a little bit hyper granulated I think switching to [facility] may be better for him. He voiced understanding. Fortunately there does not appear to be any evidence of active infection locally nor systemically  at this time. 11-12-2022 upon evaluation today patient appears to be doing better in regard to both wounds he has been tolerating the dressing changes without complication. There is no signs of infection and in general I think you are doing quite well. No fevers, chills, nausea, vomiting, or diarrhea. 11-20-2022 upon evaluation today patient appears to be doing well currently in regard to his wounds. He has been tolerating  the dressing changes without complication. Fortunately there does not appear to be any signs of infection at this time. No fevers, chills, nausea, vomiting, or diarrhea. Objective Constitutional Well-nourished and well-hydrated in no acute distress. Vitals Time Taken: 9:23 AM, Height: 75 in, Weight: 225 lbs, BMI: 28.1, Temperature: 98.2 F, Pulse: 45 bpm, Respiratory Rate: 16 breaths/min, Blood Pressure: 122/48 mmHg. Respiratory normal breathing without difficulty. Psychiatric this patient is able to make decisions and demonstrates good insight into disease process. Alert and Oriented x 3. pleasant and cooperative. General Notes: Upon inspection patient's wound bed actually showed signs of good granulation epithelization at this point. Fortunately I do not see any evidence of infection locally nor systemically which is great news I think he is doing well I did perform debridement regard to both wounds postdebridement this is significantly improved and overall I am extremely pleased with where we are today. Integumentary (Hair, Skin) Wound #19 status is Open. Original cause of wound was Surgical Injury. The date acquired was: 10/19/2022. The wound has been in treatment 4 weeks. The wound is located on the Left,Medial Lower Leg. The wound measures 1.7cm length x 1.3cm width x 0.1cm depth; 1.736cm^2 area and 0.174cm^3 volume. There is Fat Layer (Subcutaneous Tissue) exposed. There is a large amount of serosanguineous drainage noted. The wound margin is flat and intact. There is medium (34-66%) red granulation within the wound bed. There is a medium (34-66%) amount of necrotic tissue within the wound bed including Adherent Slough. Wound #9 status is Open. Original cause of wound was Gradually Appeared. The date acquired was: 01/20/2022. The wound has been in treatment 15 weeks. The wound is located on the Left,Lateral Lower Leg. The wound measures 1cm length x 0.7cm width x 0.1cm depth; 0.55cm^2 area  and 0.055cm^3 volume. There is Fat Layer (Subcutaneous Tissue) exposed. There is a medium amount of serosanguineous drainage noted. There is small (1-33%) red granulation within the wound bed. There is a medium (34-66%) amount of necrotic tissue within the wound bed including Adherent Slough. Assessment Active Problems ICD-10 Chronic venous hypertension (idiopathic) with ulcer and inflammation of bilateral lower extremity Non-pressure chronic ulcer of other part of left lower leg with fat layer exposed Non-pressure chronic ulcer of other part of right lower leg with fat layer exposed Other specified peripheral vascular diseases Paroxysmal atrial fibrillation Essential (primary) hypertension Procedures Wound #19 Pre-procedure diagnosis of Wound #19 is a Malignant Wound located on the Left,Medial Lower Leg . There was a Excisional Skin/Subcutaneous Tissue Debridement with a total area of 2.21 sq cm performed by Tommie Sams., PA-C. With the following instrument(s): Curette to remove Viable and Non-Viable tissue/material. Material removed includes Subcutaneous Tissue, Slough, Skin: Dermis, and Skin: Epidermis. No specimens were taken.A Minimum amount of bleeding was controlled with Silver Nitrate. The procedure was tolerated well. Post Debridement Measurements: 1.7cm length x 1.3cm width x 0.2cm depth; 0.347cm^3 volume. Character of Wound/Ulcer Post Debridement is stable. Post procedure Diagnosis Wound #19: Same as Pre-Procedure PATRICH, CLOVER ([id number]) [id number].pdf Page 11 of 12 Pre-procedure diagnosis of Wound #19 is a Malignant Wound  located on the Left,Medial Lower Leg . There was a Three Layer Compression Therapy Procedure by [name], RN. Post procedure Diagnosis Wound #19: Same as Pre-Procedure Wound #9 Pre-procedure diagnosis of Wound #9 is a Venous Leg Ulcer located on the Left,Lateral Lower Leg .Severity of Tissue Pre Debridement is: Fat layer  exposed. There was a Excisional Skin/Subcutaneous Tissue Debridement with a total area of 0.7 sq cm performed by Tommie Sams., PA-C. With the following instrument(s): Curette to remove Viable and Non-Viable tissue/material. Material removed includes Subcutaneous Tissue, Slough, Skin: Dermis, and Skin: Epidermis. No specimens were taken.A Minimum amount of bleeding was controlled with Pressure. The procedure was tolerated well. Post Debridement Measurements: 1cm length x 0.7cm width x 0.2cm depth; 0.11cm^3 volume. Character of Wound/Ulcer Post Debridement is stable. Severity of Tissue Post Debridement is: Fat layer exposed. Post procedure Diagnosis Wound #9: Same as Pre-Procedure Pre-procedure diagnosis of Wound #9 is a Venous Leg Ulcer located on the Left,Lateral Lower Leg . There was a Three Layer Compression Therapy Procedure by [name], RN. Post procedure Diagnosis Wound #9: Same as Pre-Procedure Plan Follow-up Appointments: Return Appointment in 1 week. Nurse Visit as needed Bathing/ Shower/ Hygiene: May shower with wound dressing protected with water repellent cover or cast protector. No tub bath. Anesthetic (Use 'Patient Medications' Section for Anesthetic Order Entry): Wound #9 Left,Lateral Lower Leg: Lidocaine applied to wound bed Edema Control - Lymphedema / Segmental Compressive Device / Other: 3 Layer Compression System for Lymphedema. - Left leg Patient to wear own compression stockings. Remove compression stockings every night before going to bed and put on every morning when getting up. - right lower leg Elevate, Exercise Daily and Avoid Standing for Long Periods of Time. Elevate legs to the level of the heart and pump ankles as often as possible Elevate leg(s) parallel to the floor when sitting. DO YOUR BEST to sleep in the bed at night. DO NOT sleep in your recliner. Long hours of sitting in a recliner leads to swelling of the legs and/or potential wounds on your  backside. Additional Orders / Instructions: Follow Nutritious Diet and Increase Protein Intake Medications-Please add to medication list.: P.O. Antibiotics - Continue antibiotics as prescribed until completed. WOUND #19: - Lower Leg Wound Laterality: Left, Medial Prim Dressing: Hydrofera Blue Ready Transfer Foam, 2.5x2.5 (in/in) 1 x Per Day/30 Days ary Discharge Instructions: Apply Hydrofera Blue Ready to wound bed as directed Secondary Dressing: ABD Pad 5x9 (in/in) 1 x Per Day/30 Days Discharge Instructions: Cover with ABD pad Com pression Wrap: 3-LAYER WRAP - Profore Lite LF 3 Multilayer Compression Bandaging System 1 x Per Day/30 Days Discharge Instructions: Apply 3 multi-layer wrap as prescribed. WOUND #9: - Lower Leg Wound Laterality: Left, Lateral Prim Dressing: Hydrofera Blue Ready Transfer Foam, 2.5x2.5 (in/in) 1 x Per Day/30 Days ary Discharge Instructions: Apply Hydrofera Blue Ready to wound bed as directed Secondary Dressing: ABD Pad 5x9 (in/in) 1 x Per Day/30 Days Discharge Instructions: Cover with ABD pad Com pression Wrap: 3-LAYER WRAP - Profore Lite LF 3 Multilayer Compression Bandaging System 1 x Per Day/30 Days Discharge Instructions: Apply 3 multi-layer wrap as prescribed. 1. Based on what I see I do believe that the patient would benefit from a continuation of therapy currently with the [facility] which I think is really doing a good job here. 2. Also can recommend that we have the patient continue to utilize the ABD pad to cover followed by a 3 layer compression wrap. 3 and also can  recommend the patient should continue to monitor for any signs of infection or worsening. Obviously based on what I am seeing I think that we are on the right track however. We will see patient back for reevaluation in 1 week here in the clinic. If anything worsens or changes patient will contact our office for additional recommendations. Electronic Signature(s) Signed: 11/20/2022  10:27:17 AM By: [name] PA-C Entered By: [name] on 11/20/2022 10:27:17 ARVEL, ALESSIO R ([id number]) [id number] Page 12 of 12 -------------------------------------------------------------------------------- SuperBill Details Patient Name: Date of Service: [name] [name] 11/20/2022 Medical Record Number: [id number] Patient Account Number: [id number] Date of Birth/Sex: Treating RN: 1946-06-08 (77 y.o. 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.812 Non-pressure chronic ulcer of other part of right lower leg with fat layer exposed I73.89 Other specified peripheral vascular diseases I48.0 Paroxysmal atrial fibrillation I10 Essential (primary) hypertension Facility Procedures : CPT4 Code: [id number] Description: [id number] - DEB SUBQ TISSUE 20 SQ CM/< ICD-10 Diagnosis Description L97.822 Non-pressure chronic ulcer of other part of left lower leg with fat layer expo Modifier: sed Quantity: 1 Physician Procedures : CPT4 Code Description Modifier [id number] - WC PHYS SUBQ TISS 20 SQ CM ICD-10 Diagnosis Description L97.822 Non-pressure chronic ulcer of other part of left lower leg with fat layer exposed Quantity: 1 Electronic Signature(s) Signed: 11/20/2022 10:34:13 AM By: [name] PA-C Entered By: [name] on 11/20/2022 10:34:13

## 2022-11-22 NOTE — Progress Notes (Signed)
JAXX, TLATELPA [name] ([id number]) [id number] Page 1 of 11 Visit Report for 11/20/2022 Arrival Information Details Patient Name: Date of Service: [name] 11/20/2022 9:30 [name] M Medical Record Number: [id number] Patient Account Number: [id number] Date of Birth/Sex: Treating RN: December 21, 1945 (77 y.o. [name] Primary Care Annamaria Salah: [name] Other Clinician: Referring Eulonda Andalon: Treating Alexi Dorminey/Extender: [name] in Treatment: 41 Visit Information History Since Last Visit Added or deleted any medications: No Patient Arrived: Ambulatory Any new allergies or adverse reactions: No Arrival Time: 09:22 Had [name] fall or experienced change in No Accompanied By: self activities of daily living that may affect Transfer Assistance: None risk of falls: Patient Identification Verified: Yes Hospitalized since last visit: No Secondary Verification Process Completed: Yes Has Dressing in Place as Prescribed: Yes Patient Requires Transmission-Based No Has Compression in Place as Prescribed: Yes Precautions: Pain Present Now: No Patient Has Alerts: Yes Patient Alerts: Patient on Blood Thinner 12/13/21 TBI [name]; NOT diabetic HISTORY OF SKIN CANCERS Electronic Signature(s) Signed: 11/21/2022 4:19:55 PM By: [name] MSN RN CNS WTA Entered By: [name] on 11/20/2022 09:23:27 -------------------------------------------------------------------------------- Clinic Level of Care Assessment Details Patient Name: Date of Service: [name] Sandborn, [name] 11/20/2022 9:30 [name] M Medical Record Number: [id number] Patient Account Number: [id number] Date of Birth/Sex: Treating RN: 01-11-1946 (77 y.o. [name] Primary Care Aryiah Monterosso: [name] Other Clinician: Referring Ma Munoz: Treating Brendyn Mclaren/Extender: [name] in Treatment: 15 Clinic Level of Care Assessment Items TOOL 1 Quantity Score []$   - 0 Use when EandM and Procedure is performed on INITIAL visit ASSESSMENTS - Nursing Assessment / Reassessment []$  - 0 General Physical Exam (combine w/ comprehensive assessment (listed just below) when performed on new pt. evals) []$  - 0 Comprehensive Assessment (HX, ROS, Risk Assessments, Wounds Hx, etc.) IMRON, KUTZ [name] ([id number]) [id number] Page 2 of 11 ASSESSMENTS - Wound and Skin Assessment / Reassessment []$  - 0 Dermatologic / Skin Assessment (not related to wound area) ASSESSMENTS - Ostomy and/or Continence Assessment and Care []$  - 0 Incontinence Assessment and Management []$  - 0 Ostomy Care Assessment and Management (repouching, [name], systems, Records, [name], [name], etc. , [name], [name], [name], cushion, seat, etc.) INTERVENTIONS - Miscellaneous []$  - 0 External ear exam []$  - 0 Patient Transfer (multiple staff / Civil Service fast streamer / Similar devices) []$  - 0 Simple Staple / Suture removal (25 or less) []$  - 0 Complex Staple / Suture removal (26 or more) []$  - 0 Hypo/Hyperglycemic Management (do not check if billed  separately) []$  - 0 Ankle / Brachial Index (ABI) - do not check if billed separately Has the patient been seen at the  hospital within the last three years: Yes Total Score: 0 Level Of Care: ____ Electronic Signature(s) Signed: 11/21/2022 4:19:55 PM By: [name] MSN RN CNS WTA Entered By: [name] on 11/20/2022 10:20:57 -------------------------------------------------------------------------------- Compression Therapy Details Patient Name: Date of Service: [name] [name], [name] 11/20/2022 9:30 [name] M Medical Record Number: [id number] Patient Account Number: [id number] Date of Birth/Sex: Treating RN: 09-Jun-1946 (77 y.o. [name] Primary Care Jalene Demo: [name] Other Clinician: Referring Humbert Morozov: Treating Tejon Gracie/Extender: Octaviano, [place], [place] ([id number]) [id number] Page 3 of 11 Weeks in Treatment: 15 Compression Therapy Performed for Wound Assessment: Wound #19 Left,Medial Lower Leg Performed By: Clinician [name], RN Compression Type: Three Layer Post Procedure Diagnosis Same as Pre-procedure Electronic Signature(s) Signed: 11/21/2022 4:19:55 PM By: [name] MSN RN CNS WTA Entered By: [name] on 11/20/2022 10:23:40 -------------------------------------------------------------------------------- Compression Therapy Details Patient Name: Date of Service: [name] [name], [name] 11/20/2022 9:30 [name] M Medical Record Number: [id number] Patient Account Number: [id number] Date of Birth/Sex: Treating RN: 1946-04-05 (77 y.o. [name] Primary Care Terald Jump: [name] Other Clinician: Referring Nakeia Calvi: Treating Kyrel Leighton/Extender: [name] in Treatment: 15 Compression Therapy Performed for Wound Assessment: Wound #9 Left,Lateral Lower Leg Performed By: Clinician [name], RN Compression Type: Three Layer Post Procedure Diagnosis Same as Pre-procedure Electronic  Signature(s) Signed: 11/21/2022 4:19:55 PM By: [name] MSN RN CNS WTA Entered By: [name] on 11/20/2022 10:23:40 -------------------------------------------------------------------------------- Encounter Discharge Information Details Patient Name: Date of Service: [name] [name], [name] 11/20/2022 9:30 [name] M Medical Record Number: [id number] Patient Account Number: [id number] Date of Birth/Sex: Treating RN: May 28, 1946 (77 y.o. [name] Primary Care Kalub Morillo: [name] Other Clinician: Referring Linzy Darling: Treating Starlit Raburn/Extender: [name] in Treatment: 15 Encounter Discharge Information Items Post Procedure Vitals Discharge Condition: Stable Temperature (F): 98.2 Ambulatory Status: Ambulatory Pulse (bpm): 45 Discharge Destination: Home Respiratory Rate (breaths/min): 16 Transportation: Private Auto Blood Pressure (mmHg): 122/48 ANTWON, WAMBACH [name] ([id number]) [id number] Page 4 of 11 Accompanied By: self Schedule Follow-up Appointment: Yes Clinical Summary of Care: Electronic Signature(s) Signed: 11/21/2022 4:19:55 PM By: [name] MSN RN CNS WTA Entered By: [name] on 11/20/2022 10:22:51 -------------------------------------------------------------------------------- Lower Extremity Assessment Details Patient Name: Date of Service: [name] Schulenburg, [name] 11/20/2022 9:30 [name] M Medical Record Number: [id number] Patient Account Number: [id number] Date of Birth/Sex: Treating RN: Dec 21, 1945 (77 y.o. [name] Primary Care Lavoy Bernards: [name] Other Clinician: Referring Jaylina Ramdass: Treating Reshunda Strider/Extender: [name] in Treatment: 15 Edema Assessment Assessed: Shirlyn Goltz: No] Patrice Paradise: No] [Left: Edema] [Right: :] Calf Left: Right: Point of Measurement: 34 cm From Medial Instep 34.5 cm Ankle Left: Right: Point of Measurement: 12 cm From Medial Instep 22.5 cm Vascular  Assessment Pulses: Dorsalis Pedis Palpable: [Left:Yes] Electronic Signature(s) Signed: 11/21/2022 4:19:55 PM By: [name] MSN RN CNS WTA Entered By: [name] on 11/20/2022 09:42:40 -------------------------------------------------------------------------------- Multi Wound Chart Details Patient Name: Date of Service: [name] [name], [name] 11/20/2022 9:30 [name] M Medical Record Number: [id number] Patient Account Number: [id number] Date of Birth/Sex: Treating RN: 29-Feb-1946 (77 y.o. 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 N/[name] Primary Etiology: Arrhythmia, Hypertension, Gout Arrhythmia, Hypertension, Gout N/[name] Comorbid History: 10/19/2022 01/20/2022 N/[name] Date Acquired: 4 15 N/[name] Weeks of Treatment: Open Open N/[name] Wound Status: No No N/[name] Wound Recurrence: 1.7x1.3x0.1 1x0.7x0.1 N/[name] Measurements L x W x D (cm) 1.736 0.55 N/[name] [name] (cm) : rea 0.174 0.055 N/[name] Volume (cm) : 72.80% 96.90% N/[name] % Reduction in [name] rea: 72.80% 98.40% N/[name] % Reduction in Volume: Full Thickness Without Exposed Full Thickness Without Exposed N/[name] Classification: Support Structures Support Structures Large Medium N/[name] Exudate Amount: Serosanguineous Serosanguineous N/[name] Exudate Type: red, brown red, brown N/[name] Exudate Color: Flat and Intact N/[name] N/[name] Wound Margin: Medium (34-66%) Small (1-33%) N/[name] Granulation Amount: Red Red N/[name] Granulation  Quality: Medium (34-66%) Medium (34-66%) N/[name] Necrotic Amount: Fat Layer (Subcutaneous Tissue): Yes Fat Layer (Subcutaneous Tissue): Yes N/[name] Exposed Structures: Fascia: No Fascia: No Tendon: No Tendon: No Muscle: No Muscle: No Joint: No Joint: No Bone: No Bone: No None N/[name] N/[name] Epithelialization: Treatment Notes Electronic Signature(s) Signed: 11/21/2022 4:19:55 PM By: [name] MSN RN CNS WTA Entered By: [name] on 11/20/2022 10:16:06 -------------------------------------------------------------------------------- Multi-Disciplinary Care Plan Details Patient Name: Date of Service: [name] [name], [name] 11/20/2022 9:30 [name] M Medical Record Number: [id number] Patient Account Number: [id number] Date of Birth/Sex: Treating RN: [birth year] (77 y.o. Kaimen, [place], [place] [name] ([id number]) [id number] Page 6 of 11 Primary Care Angelita Harnack: [name] Other Clinician: Referring Gazella Anglin: Treating Arlind Klingerman/Extender: [name] in Treatment: 15 Active Inactive Necrotic Tissue Nursing Diagnoses: Impaired tissue integrity related to necrotic/devitalized tissue Knowledge deficit related to management of necrotic/devitalized tissue Goals: Necrotic/devitalized tissue will be minimized in the wound bed Date Initiated: 09/04/2022 Target Resolution Date: 11/12/2022 Goal Status: Active Patient/caregiver will verbalize understanding of reason and process for debridement of necrotic tissue Date Initiated: 09/04/2022 Date Inactivated: 09/10/2022 Target Resolution Date: 09/27/2022 Goal Status: Met Interventions: Assess patient pain level pre-, during and post procedure and prior to discharge Provide education on necrotic tissue and debridement process Treatment Activities: Apply topical anesthetic as ordered : 09/04/2022 Excisional debridement : 09/04/2022 Notes: Venous Leg Ulcer Nursing Diagnoses: Actual venous Insuffiency (use  after diagnosis is confirmed) Knowledge deficit related to disease process and management Potential for venous Insuffiency (use before diagnosis confirmed) Goals: Non-invasive venous studies are completed as ordered Date Initiated: 09/04/2022 Date Inactivated: 10/26/2022 Target Resolution Date: 09/03/2022 Goal Status: Met Patient will maintain optimal edema control Date Initiated: 09/04/2022 Date Inactivated: 11/12/2022 Target Resolution Date: 11/12/2022 Goal Status: Met Patient/caregiver will verbalize understanding of disease process and disease management Date Initiated: 09/04/2022 Date Inactivated: 11/12/2022 Target Resolution Date: 11/12/2022 Goal Status: Met Verify adequate tissue perfusion prior to therapeutic compression application Date Initiated: 09/04/2022 Target Resolution Date: 11/12/2022 Goal Status: Active Interventions: Assess peripheral edema status every visit. Compression as ordered Provide education on venous insufficiency Treatment Activities: Therapeutic compression applied : 09/04/2022 Notes: Wound/Skin Impairment Nursing Diagnoses: Impaired tissue integrity Knowledge deficit related to smoking impact on wound healing Knowledge deficit related to ulceration/compromised skin integrity Goals: Patient/caregiver will verbalize understanding of skin care regimen Date Initiated: 09/04/2022 Date Inactivated: 09/10/2022 Target Resolution Date: 10/04/2022 Goal Status: Met Ulcer/skin breakdown will have [name] volume reduction of 30% by week 4 Date Initiated: 09/04/2022 Date Inactivated: 09/10/2022 Target Resolution Date: 10/04/2022 Goal Status: Unmet Unmet Reason: comorbidities JORGELUIS, MUKES ([id number]) [phone].pdf Page 7 of 11 Ulcer/skin breakdown will have [name] volume reduction  of 50% by week 8 Date Initiated: 09/04/2022 Date Inactivated: 11/05/2022 Target Resolution Date: 11/04/2022 Goal Status: Met Ulcer/skin breakdown will have [name] volume  reduction of 80% by week 12 Date Initiated: 09/04/2022 Target Resolution Date: 12/05/2022 Goal Status: Active Ulcer/skin breakdown will heal within 14 weeks Date Initiated: 09/04/2022 Target Resolution Date: 12/12/2022 Goal Status: Active Interventions: Assess patient/caregiver ability to obtain necessary supplies Assess patient/caregiver ability to perform ulcer/skin care regimen upon admission and as needed Assess ulceration(s) every visit Provide education on ulcer and skin care Notes: Electronic Signature(s) Signed: 11/21/2022 4:19:55 PM By: [name] MSN RN CNS WTA Entered By: [name] on 11/20/2022 10:21:48 -------------------------------------------------------------------------------- Pain Assessment Details Patient Name: Date of Service: [name] [name], [name] 11/20/2022 9:30 [name] M Medical Record Number: [id number] Patient Account Number: [id number] Date of Birth/Sex: Treating RN: July 18, 1946 (77 y.o. [name] Primary Care Aldea Avis: [name] Other Clinician: Referring Kayliana Codd: Treating Dammon Makarewicz/Extender: [name] in Treatment: 15 Active Problems Location of Pain Severity and Description of Pain Patient Has Paino No Site Locations Pain Management and Medication Current Pain Management: Electronic Signature(s) Signed: 11/21/2022 4:19:55 PM By: [name] MSN RN CNS WTA [place],Signed: 11/21/2022 4:19:55 PM By: [name] MSN RN CNS [place] [name] ([id number]) [id number] Page 8 of 11 Entered By: [name] on 11/20/2022 09:31:28 -------------------------------------------------------------------------------- Patient/Caregiver Education Details Patient Name: Date of Service: [name] 2/13/2024andnbsp9:30 [place] Record Number: [id number] Patient Account Number: [id number] Date of Birth/Gender: Treating RN: 01-May-1946 (77 y.o. [name] Primary Care Physician: [name] Other  Clinician: Referring Physician: Treating Physician/Extender: [name] in Treatment: 15 Education Assessment Education Provided To: Patient Education Topics Provided Wound Debridement: Handouts: Wound Debridement Methods: Explain/Verbal Responses: State content correctly Electronic Signature(s) Signed: 11/21/2022 4:19:55 PM By: [name] MSN RN CNS WTA Entered By: [name] on 11/20/2022 10:21:19 -------------------------------------------------------------------------------- Wound Assessment Details Patient Name: Date of Service: [name] [name], [name] 11/20/2022 9:30 [name] M Medical Record Number: [id number] Patient Account Number: [id number] Date of Birth/Sex: Treating RN: [birth year] (77 y.o. [name] Primary Care Peni Rupard: [name] Other Clinician: Referring Joenathan Sakuma: Treating Jasmane Brockway/Extender: [name] in Treatment: 15 Wound Status Wound Number: 19 Primary Etiology: Malignant Wound Wound Location: Left, Medial Lower Leg Wound Status: Open Wounding Event: Surgical Injury Comorbid History: Arrhythmia, Hypertension, Gout Date Acquired: 10/19/2022 Weeks Of Treatment: 4 Clustered Wound: No Photos [name], [name] ([id number]) [id number] Page 9 of 11 Wound Measurements Length: (cm) 1.7 Width: (cm) 1.3 Depth: (cm) 0.1 Area: (cm) 1.736 Volume: (cm) 0.174 % Reduction in Area: 72.8% % Reduction in Volume: 72.8% Epithelialization: None Wound Description Classification: Full Thickness Without Exposed Suppor Wound Margin: Flat and Intact Exudate Amount: Large Exudate Type: Serosanguineous Exudate Color: red, brown t Structures Foul Odor After Cleansing: No Slough/Fibrino Yes Wound Bed Granulation Amount: Medium (34-66%) Exposed Structure Granulation Quality: Red Fascia Exposed: No Necrotic Amount: Medium (34-66%) Fat Layer (Subcutaneous Tissue) Exposed: Yes Necrotic Quality:  Adherent Slough Tendon Exposed: No Muscle Exposed: No Joint Exposed: No Bone Exposed: No Treatment Notes Wound #19 (Lower Leg) Wound Laterality: Left, Medial Cleanser Peri-Wound Care Topical Primary Dressing Hydrofera Blue Ready Transfer Foam, 2.5x2.5 (in/in) Discharge Instruction: Apply Hydrofera Blue Ready to wound bed as directed Secondary Dressing ABD Pad 5x9 (in/in) Discharge Instruction: Cover with ABD pad Secured With Compression Wrap 3-LAYER WRAP - Profore Lite LF 3 Multilayer Compression Bandaging System Discharge Instruction: Apply 3 multi-layer wrap  as prescribed. Compression Stockings Add-Ons Electronic Signature(s) Signed: 11/21/2022 4:19:55 PM By: [name] MSN RN CNS WTA Entered By: [name] on 11/20/2022 09:41:04 [name], [name] ([id number]) [id number] Page 10 of 11 -------------------------------------------------------------------------------- Wound Assessment Details Patient Name: Date of Service: [name] [name] Scot [name] 11/20/2022 9:30 [name] M Medical Record Number: [id number] Patient Account Number: [id number] Date of Birth/Sex: Treating RN: 03/31/1946 (77 y.o. [name] Primary Care Hamda Klutts: [name] Other Clinician: Referring Kaityln Kallstrom: Treating Janielle Mittelstadt/Extender: [name] in Treatment: 15 Wound Status Wound Number: 9 Primary Etiology: Venous Leg Ulcer Wound Location: Left, Lateral Lower Leg Wound Status: Open Wounding Event: Gradually Appeared Comorbid History: Arrhythmia, Hypertension, Gout Date Acquired: 01/20/2022 Weeks Of Treatment: 15 Clustered Wound: No Photos Wound Measurements Length: (cm) 1 Width: (cm) 0.7 Depth: (cm) 0.1 Area: (cm) 0.55 Volume: (cm) 0.055 % Reduction in Area: 96.9% % Reduction in Volume: 98.4% Wound Description Classification: Full Thickness Without Exposed Suppor Exudate Amount: Medium Exudate Type: Serosanguineous Exudate Color: red, brown t  Structures Slough/Fibrino Yes Wound Bed Granulation Amount: Small (1-33%) Exposed Structure Granulation Quality: Red Fascia Exposed: No Necrotic Amount: Medium (34-66%) Fat Layer (Subcutaneous Tissue) Exposed: Yes Necrotic Quality: Adherent Slough Tendon Exposed: No Muscle Exposed: No Joint Exposed: No Bone Exposed: No Treatment Notes Wound #9 (Lower Leg) Wound Laterality: Left, Lateral Cleanser Peri-Wound Care Topical [name], [name] ([id number]) [id number] Page 11 of 11 Primary Dressing Hydrofera Blue Ready Transfer Foam, 2.5x2.5 (in/in) Discharge Instruction: Apply Hydrofera Blue Ready to wound bed as directed Secondary Dressing ABD Pad 5x9 (in/in) Discharge Instruction: Cover with ABD pad Secured With Compression Wrap 3-LAYER WRAP - Profore Lite LF 3 Multilayer Compression Bandaging System Discharge Instruction: Apply 3 multi-layer wrap as prescribed. Compression Stockings Add-Ons Electronic Signature(s) Signed: 11/21/2022 4:19:55 PM By: [name] MSN RN CNS WTA Entered By: [name] on 11/20/2022 09:41:28 -------------------------------------------------------------------------------- Vitals Details Patient Name: Date of Service: [name] [name], [name] 11/20/2022 9:30 [name] M Medical Record Number: [id number] Patient Account Number: [id number] Date of Birth/Sex: Treating RN: 03/06/1946 (77 y.o. [name] Primary Care Lexandra Rettke: [name] Other Clinician: Referring Yukio Bisping: Treating Haunani Dickard/Extender: [name] in Treatment: 15 Vital Signs Time Taken: 09:23 Temperature (F): 98.2 Height (in): 75 Pulse (bpm): 45 Weight (lbs): 225 Respiratory Rate (breaths/min): 16 Body Mass Index (BMI): 28.1 Blood Pressure (mmHg): 122/48 Reference Range: 80 - 120 mg / dl Electronic Signature(s) Signed: 11/21/2022 4:19:55 PM By: [name] MSN RN CNS WTA Entered By: [name] on 11/20/2022 09:30:55

## 2022-11-26 ENCOUNTER — Other Ambulatory Visit: Payer: Self-pay | Admitting: Family Medicine

## 2022-11-26 DIAGNOSIS — E039 Hypothyroidism, unspecified: Secondary | ICD-10-CM

## 2022-11-27 ENCOUNTER — Encounter: Payer: Medicare PPO | Admitting: Physician Assistant

## 2022-11-27 DIAGNOSIS — I480 Paroxysmal atrial fibrillation: Secondary | ICD-10-CM | POA: Diagnosis not present

## 2022-11-27 DIAGNOSIS — L97812 Non-pressure chronic ulcer of other part of right lower leg with fat layer exposed: Secondary | ICD-10-CM | POA: Diagnosis not present

## 2022-11-27 DIAGNOSIS — Z7901 Long term (current) use of anticoagulants: Secondary | ICD-10-CM | POA: Diagnosis not present

## 2022-11-27 DIAGNOSIS — C4922 Malignant neoplasm of connective and soft tissue of left lower limb, including hip: Secondary | ICD-10-CM | POA: Diagnosis not present

## 2022-11-27 DIAGNOSIS — I10 Essential (primary) hypertension: Secondary | ICD-10-CM | POA: Diagnosis not present

## 2022-11-27 DIAGNOSIS — I87333 Chronic venous hypertension (idiopathic) with ulcer and inflammation of bilateral lower extremity: Secondary | ICD-10-CM | POA: Diagnosis not present

## 2022-11-27 DIAGNOSIS — I739 Peripheral vascular disease, unspecified: Secondary | ICD-10-CM | POA: Diagnosis not present

## 2022-11-27 DIAGNOSIS — I428 Other cardiomyopathies: Secondary | ICD-10-CM | POA: Diagnosis not present

## 2022-11-27 DIAGNOSIS — L97822 Non-pressure chronic ulcer of other part of left lower leg with fat layer exposed: Secondary | ICD-10-CM | POA: Diagnosis not present

## 2022-11-27 DIAGNOSIS — I87332 Chronic venous hypertension (idiopathic) with ulcer and inflammation of left lower extremity: Secondary | ICD-10-CM | POA: Diagnosis not present

## 2022-11-27 DIAGNOSIS — I872 Venous insufficiency (chronic) (peripheral): Secondary | ICD-10-CM | POA: Diagnosis not present

## 2022-11-27 NOTE — Telephone Encounter (Signed)
Requested Prescriptions  Pending Prescriptions Disp Refills   levothyroxine (SYNTHROID) 50 MCG tablet [Pharmacy Med Name: LEVOTHYROXINE SODIUM 50 MCG TAB] 30 tablet 1    Sig: TAKE 1 TABLET BY MOUTH DAILY     Endocrinology:  Hypothyroid Agents Failed - 11/26/2022  2:05 PM      Failed - TSH in normal range and within 360 days    TSH  Date Value Ref Range Status  09/24/2022 6.520 (H) 0.450 - 4.500 uIU/mL Final         Passed - Valid encounter within last 12 months    Recent Outpatient Visits           3 months ago Essential hypertension   [place], [name] E, MD   3 months ago Persistent dry cough   [place], [place], PA-C   1 year ago Annual physical exam   [facility] Birdie Sons, MD   2 years ago Acquired thrombophilia [facility])   [place], [name] E, MD   3 years ago Annual physical exam   [facility] Birdie Sons, MD       Future Appointments             In 2 months Ellyn Hack Leonie Green, MD [facility]   In 10 months McG[place], Shannon A, [place]

## 2022-11-28 ENCOUNTER — Telehealth: Payer: Medicare PPO | Admitting: Physician Assistant

## 2022-11-28 DIAGNOSIS — I480 Paroxysmal atrial fibrillation: Secondary | ICD-10-CM | POA: Diagnosis not present

## 2022-11-28 DIAGNOSIS — L97812 Non-pressure chronic ulcer of other part of right lower leg with fat layer exposed: Secondary | ICD-10-CM | POA: Diagnosis not present

## 2022-11-28 DIAGNOSIS — I10 Essential (primary) hypertension: Secondary | ICD-10-CM | POA: Diagnosis not present

## 2022-11-28 DIAGNOSIS — B9689 Other specified bacterial agents as the cause of diseases classified elsewhere: Secondary | ICD-10-CM

## 2022-11-28 DIAGNOSIS — J019 Acute sinusitis, unspecified: Secondary | ICD-10-CM | POA: Diagnosis not present

## 2022-11-28 DIAGNOSIS — I87333 Chronic venous hypertension (idiopathic) with ulcer and inflammation of bilateral lower extremity: Secondary | ICD-10-CM | POA: Diagnosis not present

## 2022-11-28 DIAGNOSIS — I7389 Other specified peripheral vascular diseases: Secondary | ICD-10-CM | POA: Diagnosis not present

## 2022-11-28 DIAGNOSIS — L97822 Non-pressure chronic ulcer of other part of left lower leg with fat layer exposed: Secondary | ICD-10-CM | POA: Diagnosis not present

## 2022-11-28 MED ORDER — FLUTICASONE PROPIONATE 50 MCG/ACT NA SUSP: 2.0000 | Freq: Every day | NASAL | 0 refills | Status: DC

## 2022-11-28 MED ORDER — AMOXICILLIN-POT CLAVULANATE 875-125 MG PO TABS: 1.0000 | ORAL_TABLET | Freq: Two times a day (BID) | ORAL | 0 refills | Status: DC

## 2022-11-28 NOTE — Progress Notes (Signed)
E-Visit for Sinus Problems  We are sorry that you are not feeling well.  Here is how we plan to help!  Based on what you have shared with me it looks like you have sinusitis.  Sinusitis is inflammation and infection in the sinus cavities of the head.  Based on your presentation I believe you most likely have Acute Bacterial Sinusitis.  This is an infection caused by bacteria and is treated with antibiotics. I have prescribed Augmentin 871m/125mg one tablet twice daily with food, for 7 days. I have also prescribed Flonase nasal spray Use 2 sprays each nostril daily for 10-14 days.  You may use an oral decongestant such as Mucinex D or if you have glaucoma or high blood pressure use plain Mucinex. Saline nasal spray help and can safely be used as often as needed for congestion.  If you develop worsening sinus pain, fever or notice severe headache and vision changes, or if symptoms are not better after completion of antibiotic, please schedule an appointment with a health care provider.    Sinus infections are not as easily transmitted as other respiratory infection, however we still recommend that you avoid close contact with loved ones, especially the very young and elderly.  Remember to wash your hands thoroughly throughout the day as this is the number one way to prevent the spread of infection!  Home Care: Only take medications as instructed by your medical team. Complete the entire course of an antibiotic. Do not take these medications with alcohol. A steam or ultrasonic humidifier can help congestion.  You can place a towel over your head and breathe in the steam from hot water coming from a faucet. Avoid close contacts especially the very young and the elderly. Cover your mouth when you cough or sneeze. Always remember to wash your hands.  Get Help Right Away If: You develop worsening fever or sinus pain. You develop a severe head ache or visual changes. Your symptoms persist after you have  completed your treatment plan.  Make sure you Understand these instructions. Will watch your condition. Will get help right away if you are not doing well or get worse.  Thank you for choosing an e-visit.  Your e-visit answers were reviewed by a board certified advanced clinical practitioner to complete your personal care plan. Depending upon the condition, your plan could have included both over the counter or prescription medications.  Please review your pharmacy choice. Make sure the pharmacy is open so you can pick up prescription now. If there is a problem, you may contact your provider through [employer] have the prescription routed to another pharmacy.  Your safety is important to [place] If you have drug allergies check your prescription carefully.   For the next 24 hours you can use MyChart to ask questions about today's visit, request a non-urgent call back, or ask for a work or school excuse. You will get an email in the next two days asking about your experience. I hope that your e-visit has been valuable and will speed your recovery.  I have spent 5 minutes in review of e-visit questionnaire, review and updating patient chart, medical decision making and response to patient.   JMar Daring PA-C

## 2022-11-28 NOTE — Progress Notes (Addendum)
DORN, KARP [name] ([id number]) [id number] Page 1 of 12 Visit Report for 11/27/2022 Chief Complaint Document Details Patient Name: Date of Service: [name] Rhetta [name] 11/27/2022 9:30 [name] Medical Record Number: [id number] Patient Account Number: [id number] Date of Birth/Sex: Treating RN: [birth year] (77 y.o. [name] Primary Care Provider: Lelon [name] Other Clinician: Referring Provider: Treating Provider/Extender: [name] in Treatment: 16 Information Obtained from: Patient Chief Complaint Bilateral LE Ulcers Electronic Signature(s) Signed: 11/27/2022 9:44:28 AM By: [name] Keeler PA-C Entered By: [name] on 11/27/2022 09:44:28 -------------------------------------------------------------------------------- Debridement Details Patient Name: Date of Service: [name], [name] 11/27/2022 9:30 [name] Medical Record Number: [id number] Patient Account Number: [id number] Date of Birth/Sex: Treating RN: Jan 04, 1946 (77 y.o. [name] Primary Care Provider: Lelon [name] Other Clinician: Referring Provider: Treating Provider/Extender: [name] in Treatment: 16 Debridement Performed for Assessment: Wound #9 Left,Lateral Lower Leg Performed By: Physician [name], PA-C Debridement Type: Debridement Severity of Tissue Pre Debridement: Fat layer exposed Level of Consciousness (Pre-procedure): Awake and Alert Pre-procedure Verification/Time Out No Taken: Start Time: 10:08 T Area Debrided (L x W): otal 0.5 (cm) x 0.4 (cm) = 0.2 (cm) Tissue and other material debrided: Slough, Subcutaneous, Slough Level: Skin/Subcutaneous Tissue Debridement Description: Excisional Instrument: Curette Bleeding: Minimum Hemostasis Achieved: Pressure Response to Treatment: Procedure was tolerated well Level of Consciousness (Post- Awake and Alert procedure): [name], [name] ([id number])  [id number] Page 2 of 12 Post Debridement Measurements of Total Wound Length: (cm) 0.5 Width: (cm) 0.4 Depth: (cm) 0.2 Volume: (cm) 0.031 Character of Wound/Ulcer Post Debridement: Stable Severity of Tissue Post Debridement: Fat layer exposed Post Procedure Diagnosis Same as Pre-procedure Electronic Signature(s) Signed: 11/27/2022 4:52:42 PM By: [name] Loud MSN RN CNS WTA Signed: 11/27/2022 5:46:15 PM By: [name] Keeler PA-C Entered By: [name] on 11/27/2022 10:09:58 -------------------------------------------------------------------------------- Debridement Details Patient Name: Date of Service: [name] [name], [name] 11/27/2022 9:30 [name] Medical Record Number: [id number] Patient Account Number: [id number] Date of Birth/Sex: Treating RN: 06/28/1946 (77 y.o. [name] Primary Care Provider: Lelon [name] Other Clinician: Referring Provider: Treating Provider/Extender: [name] in Treatment: 16 Debridement Performed for Assessment: Wound #19 Left,Medial Lower Leg Performed By: Physician [name], PA-C Debridement Type: Debridement Level of Consciousness (Pre-procedure): Awake and Alert Pre-procedure Verification/Time Out No Taken: Start Time: 10:08 T Area Debrided (L x W): otal 1.3 (cm) x 1.5 (cm) = 1.95 (cm) Tissue and other material debrided: Slough, Subcutaneous, Slough Level: Skin/Subcutaneous Tissue Debridement Description: Excisional Instrument: Curette Bleeding: Minimum Hemostasis Achieved: Pressure Response to Treatment: Procedure was tolerated well Level of Consciousness (Post- Awake and Alert procedure): Post Debridement Measurements of Total Wound Length: (cm) 1.3 Width: (cm) 1.5 Depth: (cm) 0.2 Volume: (cm) 0.306 Character of Wound/Ulcer Post Debridement: Stable Post Procedure Diagnosis Same as Pre-procedure Electronic Signature(s) Signed: 11/27/2022 4:52:42 PM By: [name] Loud MSN RN CNS  WTA Signed: 11/27/2022 5:46:15 PM By: [name] Keeler PA-C Entered By: [name] on 11/27/2022 10:10:36 [name] ([id number]) [id number] Page 3 of 12 -------------------------------------------------------------------------------- HPI Details Patient Name: Date of Service: [name] [name] 11/27/2022 9:30 [name] Medical Record Number: [id number] Patient Account Number: [id number] Date of Birth/Sex: Treating RN: [birth year] (77 y.o. 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 the patient has had [name] vascular workup over 7 years ago and said everything was normal at that stage. He does not have any chronic problems except for cardiac issues which he sees [name] cardiologist in [place]. 08/15/2017 -- arterial and venous duplex studies still pending. 08/23/2017 -- venous reflux studies done on 08/13/2017 shows venous incompetence throughout the left lower extremity deep system and focally at the left saphenofemoral junction. 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 No evidence of SVT or DVT in bilateral lower extremities The patient has an appointment at the end of the month to get his arterial duplex study done 09/05/2017 -- the patient was seen at the vein and vascular office yesterday by [name] ABI studies were notable for medial calcification and the toe brachial indices were  normal and bilateral ankle-brachial) waveforms were normal with triphasic flow. After review of his venous studies he was not [name] candidate for laser ablation and his lymphedema was to be treated with compression stockings and lymphedema pump pumps 09/12/2017 -- had [name] low arterial study done at the [facility] -- unable to obtain reliable ABI is due to medial calcification. Bilateral toe indices were normal with the right being 1.01 and the left being 0.92 and the waveforms were triphasic bilaterally. he did get hold of 30-40 mm compression stockings but is unable to put these on. We will try and get him alternative compression stockings. 09/26/17- he is here in follow up evaluation of [name] right lower extremity ulcer;he is compliant in wearing compression stocking; ulcer almost epithelialized , anticipate healing next appointment Readmission: 11/17 point upon evaluation patient's wound currently that he is seeing [place] for today is [name] skin cancerous lesion that was cleared away by his dermatologist on the left medial calf region. He tells me that this is [name] very similar thing to what he had done previously in fact the last time he saw him in 2018 this was also what was going on at that point. Nonetheless he feels that based on what he seeing currently that this is just having [name] lot of harder time healing although it is much closer to the surface than what he is experienced in the past. He notes that the initial removal was in June 2022 which was this year this is now November and still has not closed. He does have some edema and definitely I think that there is some venous component to his slow healing here. Also think that we can do something better than Vaseline to try to help with getting this to clear up as quickly as possible. He does have [name] history of atrial fibrillation and is on Eliquis otherwise he really has no major medical problems that would affect wound healing. 09/07/2021  upon evaluation today patient actually appears to be doing significantly better after having wrapped him last week. Overall I think that this is making significant improvements at this time which is great news. I do not see any evidence of infection which is great news as well. No fevers, chills, nausea, vomiting, or diarrhea. 09/14/2021 upon evaluation today patient appears to be doing well with regard to his leg ulcer. He has been tolerating the dressing changes and  overall I think that he is making excellent progress. I do not see any signs of active infection at this time. 09/21/2021 upon evaluation today patient actually appears to be making good progress with regard to his wound this is again measuring smaller today no debridement seems to be necessary. We have been using [name] silver collagen dressing and I think that is doing an awesome job. 09/28/2021 upon evaluation today patient appears to be doing well with regard to his leg currently. I do not see any signs of active infection at this time which is great news. No fevers, chills, nausea, vomiting, or diarrhea. I think this wound is very close to complete resolution. 10/12/2021 upon evaluation today patient actually appears to be doing awesome in regard to his leg ulcer. In fact this appears to be completely healed based on what I am seeing currently. I do not see any evidence of active infection locally nor systemically at this time which is also great news. No fevers, chills, nausea, vomiting, or diarrhea. Readmission: 12/07/2021 upon evaluation today patient presents for readmission here in the clinic. He was discharged on 10/12/2021 is completely healed. Unfortunately this has reopened at this point and he is having continual issues with new blisters over both lower extremities. This is even worse than what we previously saw. Nonetheless we did actually check his ABIs today and it did reveal that his ABIs were 0.55 on the left and 0.57 on the  right. Subsequently this is [name] definite change from his last arterial study which showed that he did have good blood flow at 1.01 on the right and 0.92 on the left and that was right at the beginning of 2019. Nonetheless based on what we see currently I do think he tolerated the 3 layer compression wrap but I do believe that we probably need to get him tested for his arterial flow in order to see where things stand and if there is something we can do there that would help prevent this from continue to be an ongoing issue. He did not utilize compression socks in the interim from when he was last here till this time. That something is probably going to need lifelong going forward as well. 3/9; patient presents for follow-up. He has no issues or complaints today. He tolerated the compression wrap well. He had ABIs with TBI's done. He denies signs of infection. JOBIN, [name] ([id number]) [id number] Page 4 of 12 12/21/2021 upon evaluation today patient appears to be doing well with regard to the wounds on his legs. Both are showing signs of significant improvement which is great news although I do believe some sharp debridement would be of benefit here as well. 12/28/2021 upon evaluation today patient appears to be doing well with regard to his wounds. Everything is showing signs of excellent improvement which I am very pleased about. I think that we are headed in the right direction here. Fortunately there does not appear to be any evidence of infection which is great news there is [name] little bit of hypergranulation. 01/04/2022 upon evaluation today patient appears to be doing well with regard to his wounds 2 of them are healed 1 is almost so and the other 1 is significantly better. Overall I am extremely pleased with where we stand and I think that he is making excellent progress here. I do not see any evidence of active infection locally nor systemically at this  time. 01-16-2022 upon evaluation today patient's wound on the left leg is  showing signs of doing quite well. Has not completely cleared at this point but it is much improved. Fortunately I do not see any signs of infection at this time. No fevers, chills, nausea, vomiting, or diarrhea. 01-23-2022 upon evaluation today patient's wound of the left leg actually appears to be pretty much completely healed which is great news. I do not see any signs of active infection locally or systemically which is excellent. With that being said on the right leg what wound is measuring smaller the other 1 is [name] new wound that just showed up fortunately its not too bad. Has been using Xeroform here and that seems to be doing decently well which is great news. Unfortunately his blood pressure is significantly high we gave him the readings for the past 4-5 visits as well as [name] recommendation to make an appointment to go discuss this with his primary care provider patient states that he is going to look into doing this. 01-30-2022 upon evaluation today patient appears to be doing well with regard to his left leg everything appears to be healed. On the right leg the more anterior wound is healed the more medial wound that I been concerned about [name] possible skin cancer unfortunately still does not look great to me. I do believe that we should probably do [name] biopsy I have talked about it with him [name] few times I think though it is probably time to go ahead and do this at this point. 02-09-2022 upon evaluation today patient appears to be doing well with regard to his legs. On the left this appears to be completely healed. On the right he does have 2 areas and be perfectly honest one of them is [name] skin cancer that he is going to the Mohs surgery clinic for the other seems to be healing nicely. Readmission: 08-02-2022 upon evaluation today patient appears for reevaluation here in our clinic concerning issues that he has been having with  wounds over the bilateral lower extremities. I last saw him in May 2023 and at that point we had him completely healed. Unfortunately he is tells me this has broken down to some degree since that point. 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 He tells me this 1 seems to just be getting bigger and bigger not improving. Fortunately he is not having any significant pain which is good news he does have quite [name] bit of swelling and he tells me that his fluid pills are not recommended for him to take daily but just in 3-day intervals here and there. 08-09-2022 upon evaluation today patient appears to be doing still somewhat poorly in regard to his legs although in general he does not appear to be feeling as good as he has been. Fortunately there does not appear to be any signs of infection which is good news. With that being said he is having some issues here with having and overall poor feeling in general which again is good I think going to be the biggest complicating factor. He actually seems to be coughing I do not hear any wheezing right now I did listen to his chest he did not have good airflow down low however makes me suspicious for bronchitis or even possibly pneumonia which could be part of what  is going on here as well. Fortunately I do not see any evidence of anything worsening in regard to his legs but I definitely believe that he needs to continue with the compression wraps he took them off yesterday to shower has not had anything on for 24 hours that is why his legs are so swollen today. With regard to his pathology report I did review that it showed some squamous abnormality but no signs of distinct carcinoma. With that being said it was saying that it could be adjacent to [name] squamous cell  carcinoma nonetheless my suggestion is can be that we have the patient take copy of this report and give it to his Mohs surgeon in order for them to see if there is anything they feel like needs to be done further. With that being said right now I feel like the primary thing is going to be for [place] to try to get his swelling down and keep that down into that hand since he is having so much drainage I believe we can have to bring him in for dressing changes twice [name] week doing [name] nurse visit on Mondays. 11/9; since the patient was last here he spent the night in the emergency room he received IV Lasix. Also received antibiotics although he was not discharged on either 1 of these. He also saw his cardiology office who put him on regular Lasix 20 mg [previously on as needed Lasix 20 mg]. Per our intake nurse the swelling in his legs is remarkably better but he still has bilateral lower extremity wounds. He still has wounds on the bilateral lower extremities most problematically on the left lateral calf. He has been using silver alginate under 3 layer compression. 08-23-2022 upon evaluation today patient appears to be doing much better than the last time I saw him 2 weeks ago. At that point I was very concerned about how he was doing he did see Dr. Caryn Section his primary care provider they got him on some blood pressure medication in general his color and overall appearance looks to be doing much improved compared to the last time I saw him. 09-04-2022 upon evaluation today patient appears to be doing well currently in regard to his wounds. Everything is showing signs of improvement which is great news. Fortunately there does not appear to be any signs of active infection locally or systemically at this time. No fevers, chills, nausea, vomiting, or diarrhea. 09-10-2022 upon evaluation today patient appears to be doing better in regard to his wounds although the [facility] was extremely stuck to the wound  bed. Fortunately there does not appear to be any signs of infection locally or systemically at this time which is great news. No fevers, chills, nausea, vomiting, or diarrhea. 09-17-2022 upon evaluation today patient appears to be doing well currently in regard to his wounds in general. The right leg actually showing signs of excellent improvement and very pleased with where things stand in that regard. Fortunately I do not see any evidence of infection locally or systemically at this time which is great news. No fevers, chills, nausea, vomiting, or diarrhea. 09-24-2022 upon evaluation today patient appears to be doing well currently in regard to his wounds. Things look to be doing quite well. 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 He is seeing his dermatologist tomorrow in that  regard. With that being said other than that however he seems to really be making some pretty good progress here which is good news. No fevers, chills, nausea, vomiting, or diarrhea. 12/26; the patient has 2 open wounds remaining on the left leg. One is on the left anterior mid tibia and the other is on the right lateral knee just outside of the popliteal fossa. 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 The patient has been to see dermatology Dr. Evorn Gong who apparently is making him [name] referral to the [facility] Mohs surgery center. He does not yet have an appointment 10-12-2022 upon evaluation today patient appears to be doing well currently in regard to his wound. He has been tolerating the dressing changes without complication and overall feel like we are headed in the right direction. Fortunately I do not see any signs of infection locally or systemically at this time which is great news. No fevers, chills, nausea, vomiting, or diarrhea. 10-23-2022 upon  evaluation today patient appears to be doing well currently in regard to his wound. He has been tolerating the dressing changes without complication. Fortunately there does not appear to be any signs of active infection locally nor systemically which is great news and overall I am extremely pleased with where we stand currently. No fevers, chills, nausea, vomiting, or diarrhea. 10-26-2022 upon evaluation today patient appears to be doing well currently in regard to his wounds. Everything is showing signs of improvement and this is great news. Fortunately I see no evidence of active infection systemically. He does seem to be doing much better in regard to the local infection in regard to [name], [name] ([id number]) [id number] Page 5 of 12 his leg. The smell is also greatly improved. Overall I am extremely happy with where we stand today. This is after just [name] few days with the antibiotic on board. 11-05-2022 upon evaluation today patient appears to be doing well currently in regard to his wounds although the wound where they performed the Mohs surgery does look [name] little bit hyper granulated I think switching to [facility] may be better for him. He voiced understanding. Fortunately there does not appear to be any evidence of active infection locally nor systemically at this time. 11-12-2022 upon evaluation today patient appears to be doing better in regard to both wounds he has been tolerating the dressing changes without complication. There is no signs of infection and in general I think you are doing quite well. No fevers, chills, nausea, vomiting, or diarrhea. 11-20-2022 upon evaluation today patient appears to be doing well currently in regard to his wounds. He has been tolerating the dressing changes without complication. Fortunately there does not appear to be any signs of infection at this time. No fevers, chills, nausea, vomiting, or diarrhea. 11-27-2022 upon evaluation  today patient appears to be doing somewhat poorly in regard to his leg in general he has [name] lot of areas where he looks like he had some spots that popped up. There with regard to new possible blisters. In general I am actually very concerned about the fact that the wrap may be causing some irritation here. I think that we can try to not do the wrap for 1 week, and given the prescription for mupirocin ointment which I will send into the pharmacy for him. Electronic Signature(s) Signed: 11/27/2022 10:45:24 AM By: [name] Keeler PA-C Entered By: [name] on 11/27/2022 10:45:24 -------------------------------------------------------------------------------- Physical Exam Details Patient Name: Date of  Service: [name] [place], [name] 11/27/2022 9:30 [name] Medical Record Number: [id number] Patient Account Number: [id number] Date of Birth/Sex: Treating RN: [birth year] (77 y.o. [name] Primary Care Provider: Lelon [name] Other Clinician: Referring Provider: Treating Provider/Extender: [name] in Treatment: 50 Constitutional Well-nourished and well-hydrated in no acute distress. Respiratory normal breathing without difficulty. Psychiatric this patient is able to make decisions and demonstrates good insight into disease process. Alert and Oriented x 3. pleasant and cooperative. Notes Upon inspection patient's wound did require sharp debridement at both locations medial and lateral in April tolerated the debridement without complication postdebridement this is significantly improved which is great news. Electronic Signature(s) Signed: 11/27/2022 10:45:38 AM By: [name] Keeler PA-C Entered By: [name] on 11/27/2022 10:45:38 Physician Orders Details -------------------------------------------------------------------------------- [name] ([id number]) [id number] Page 6 of 12 Patient Name: Date of Service: [name] Rhetta [name] 11/27/2022 9:30 [name] Medical Record Number: [id number] Patient Account Number: [id number] Date of Birth/Sex: Treating RN: Feb 27, 1946 (77 y.o. [name] Primary Care Provider: Lelon [name] Other Clinician: Referring Provider: Treating Provider/Extender: [name] in Treatment: [phone] Verbal / Phone Orders: No Diagnosis Coding ICD-10 Coding Code Description [phone] Chronic venous hypertension (idiopathic) with ulcer and inflammation of bilateral lower extremity L97.822 Non-pressure chronic ulcer of other part of left lower leg with fat layer exposed L97.812 Non-pressure chronic ulcer of other part of right lower leg with fat layer exposed I73.89 Other specified peripheral vascular diseases I48.0 Paroxysmal atrial fibrillation I10 Essential (primary) hypertension Follow-up Appointments Return Appointment in 1 week. Nurse Visit as needed Bathing/ Shower/ Hygiene May shower with wound dressing protected with water repellent cover or cast protector. No tub bath. Anesthetic (Use 'Patient Medications' Section for Anesthetic Order Entry) Wound #9 Left,Lateral Lower Leg Lidocaine applied to wound bed Edema Control - Lymphedema / Segmental Compressive Device / Other Bilateral Lower Extremities Patient to wear own compression stockings. Remove compression stockings every night before going to bed and put on every morning when getting up. - right lower leg Elevate, Exercise Daily and [name] void Standing for Long Periods of Time. Elevate legs to the level of the heart and pump ankles as often as possible Elevate leg(s) parallel to the floor when sitting. DO YOUR BEST to sleep in the bed at night. DO NOT sleep in your recliner. Long hours of sitting in [name] recliner leads to swelling of the legs and/or potential wounds on your backside. Additional Orders / Instructions Follow Nutritious Diet and Increase Protein Intake Medications-Please add to medication  list. ntibiotic - Mupirocin Topical [name] Wound Treatment Wound #19 - Lower Leg Wound Laterality: Left, Medial Prim Dressing: Hydrofera Blue Ready Transfer Foam, 2.5x2.5 (in/in) (DME) (Dispense As Written) 3 x Per Week/30 Days ary Discharge Instructions: Apply Hydrofera Blue Ready to wound bed as directed Secondary Dressing: (BORDER) Zetuvit Plus SILICONE BORDER Dressing 4x4 (in/in) (DME) (Generic) 3 x Per Week/30 Days Discharge Instructions: Please do not put silicone bordered dressings under wraps. Use non-bordered dressing only. Wound #9 - Lower Leg Wound Laterality: Left, Lateral Prim Dressing: Hydrofera Blue Ready Transfer Foam, 2.5x2.5 (in/in) (DME) (Dispense As Written) 3 x Per Week/30 Days ary Discharge Instructions: Apply Hydrofera Blue Ready to wound bed as directed Secondary Dressing: ABD Pad 5x9 (in/in) (DME) (Generic) 3 x Per Week/30 Days Discharge Instructions: Cover with ABD pad Secured With: Medipore T - 45M Medipore H Soft Cloth Surgical T ape ape, 2x2 (in/yd) (DME) (Generic)  3 x Per Week/30 Days Secured With: [employer] Sterile or Non-Sterile 6-ply 4.5x4 (yd/yd) (DME) (Generic) 3 x Per Week/30 Days Discharge Instructions: Apply Kerlix as directed Secured With: Tubigrip Size D, 3x10 (in/yd) 3 x Per Week/30 Days Patient Medications llergies: No Known Allergies [name] Notifications Medication Indication [address] [place], [place] [name] ([id number]) [id number] Page 7 of 12 11/27/2022 mupirocin DOSE topical 2 % ointment - ointment topical once daily applied in [name] thin film to the left leg where there are small open wounds Electronic Signature(s) Signed: 11/27/2022 4:52:42 PM By: [name] Loud MSN RN CNS WTA Signed: 11/27/2022 5:46:15 PM By: [name] Keeler PA-C Previous Signature: 11/27/2022 10:47:42 AM Version By: [name] Keeler PA-C Entered By: [name] on 11/27/2022  12:40:00 -------------------------------------------------------------------------------- Problem List Details Patient Name: Date of Service: [name], [name] 11/27/2022 9:30 [name] Medical Record Number: [id number] Patient Account Number: [id number] Date of Birth/Sex: Treating RN: 04-Feb-1946 (77 y.o. [name] Primary Care Provider: Lelon [name] Other Clinician: Referring Provider: Treating Provider/Extender: [name] in Treatment: 16 Active Problems ICD-10 Encounter Code Description Active Date MDM Diagnosis [phone] Chronic venous hypertension (idiopathic) with ulcer and inflammation of 08/02/2022 No Yes bilateral lower extremity L97.822 Non-pressure chronic ulcer of other part of left lower leg with fat layer 08/02/2022 No Yes exposed L97.812 Non-pressure chronic ulcer of other part of right lower leg with fat layer 08/02/2022 No Yes exposed I73.89 Other specified peripheral vascular diseases 08/02/2022 No Yes I48.0 Paroxysmal atrial fibrillation 08/02/2022 No Yes I10 Essential (primary) hypertension 08/02/2022 No Yes Inactive Problems Resolved Problems Electronic Signature(s) Signed: 11/27/2022 4:52:42 PM By: [name] Loud MSN RN CNS WTA Signed: 11/27/2022 5:46:15 PM By: [name] Keeler PA-C Previous Signature: 11/27/2022 9:44:25 AM Version By: Tyson Dense, [place] ([id number]) [id number] Page 8 of 12 Previous Signature: 11/27/2022 9:44:25 AM Version By: [name] Keeler PA-C Entered By: [name] on 11/27/2022 10:16:51 -------------------------------------------------------------------------------- Progress Note Details Patient Name: Date of Service: [name] [name] 11/27/2022 9:30 [name] Medical Record Number: [id number] Patient Account Number: [id number] Date of Birth/Sex: Treating RN: 03/09/1946 (77 y.o. 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 the patient has had [name] vascular workup over 7 years ago and said everything was normal at that stage. He does not have any chronic problems except for cardiac issues which he sees [name] cardiologist in [place]. 08/15/2017 -- arterial and venous duplex studies still pending. 08/23/2017 -- venous reflux studies done on 08/13/2017 shows venous incompetence throughout the left lower extremity deep system and focally at the left saphenofemoral junction. 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 No evidence of SVT or DVT in bilateral lower extremities The patient has an appointment at the end of the month to get his arterial duplex study done 09/05/2017 -- the patient was seen at the vein and vascular office yesterday by [name] ABI studies were notable for medial calcification and the toe brachial indices were normal and bilateral ankle-brachial) waveforms were  normal with triphasic flow. After review of his venous studies he was not [name] candidate for laser ablation and his lymphedema was to be treated with compression stockings and lymphedema pump pumps 09/12/2017 -- [facility] [name] [facility] -- unable to obtain reliable ABI is due to medial calcification. Bilateral toe indices were normal with the right being 1.01 and the left being 0.92 and the  waveforms were triphasic bilaterally. he did get hold of 30-40 mm compression stockings but is unable to put these on. We will try and get him alternative compression stockings. 09/26/17- he is here in follow up evaluation of [name] right lower extremity ulcer;he is compliant in wearing compression stocking; ulcer almost epithelialized , anticipate healing next appointment Readmission: 11/17 point upon evaluation patient's wound currently that he is seeing [place] for today is [name] skin cancerous lesion that was cleared away by his dermatologist on the left medial calf region. He tells me that this is [name] very similar thing to what he had done previously in fact the last time he saw him in 2018 this was also what was going on at that point. Nonetheless he feels that based on what he seeing currently that this is just having [name] lot of harder time healing although it is much closer to the surface than what he is experienced in the past. He notes that the initial removal was in June 2022 which was this year this is now November and still has not closed. He does have some edema and definitely I think that there is some venous component to his slow healing here. Also think that we can do something better than Vaseline to try to help with getting this to clear up as quickly as possible. He does have [name] history of atrial fibrillation and is on Eliquis otherwise he really has no major medical problems that would affect wound healing. 09/07/2021 upon evaluation today patient actually appears to be doing significantly better after having wrapped him last week. Overall I think that this is making significant improvements at this time which is great news. I do not see any evidence of infection which is great news as well. No fevers, chills, nausea, vomiting, or diarrhea. 09/14/2021 upon evaluation today patient appears to be doing well with regard to his leg ulcer. He has been tolerating the dressing changes and overall I  think that he is making excellent progress. I do not see any signs of active infection at this time. 09/21/2021 upon evaluation today patient actually appears to be making good progress with regard to his wound this is again measuring smaller today no debridement seems to be necessary. We have been using [name] silver collagen dressing and I think that is doing an awesome job. 09/28/2021 upon evaluation today patient appears to be doing well with regard to his leg currently. I do not see any signs of active infection at this time which is great news. No fevers, chills, nausea, vomiting, or diarrhea. I think this wound is very close to complete resolution. 10/12/2021 upon evaluation today patient actually appears to be doing awesome in regard to his leg ulcer. In fact this appears to be completely healed based on what I am seeing currently. I do not see any evidence of active infection locally nor systemically at this time which is also great news. No fevers, chills, nausea, vomiting, or diarrhea. Readmission: 12/07/2021 upon evaluation today patient presents for readmission here in the clinic. He  was discharged on 10/12/2021 is completely healed. Unfortunately this has [name], [name] ([id number]) [id number] Page 9 of 12 reopened at this point and he is having continual issues with new blisters over both lower extremities. This is even worse than what we previously saw. Nonetheless we did actually check his ABIs today and it did reveal that his ABIs were 0.55 on the left and 0.57 on the right. Subsequently this is [name] definite change from his last arterial study which showed that he did have good blood flow at 1.01 on the right and 0.92 on the left and that was right at the beginning of 2019. Nonetheless based on what we see currently I do think he tolerated the 3 layer compression wrap but I do believe that we probably need to get him tested for his arterial flow in order to see  where things stand and if there is something we can do there that would help prevent this from continue to be an ongoing issue. He did not utilize compression socks in the interim from when he was last here till this time. That something is probably going to need lifelong going forward as well. 3/9; patient presents for follow-up. He has no issues or complaints today. He tolerated the compression wrap well. He had ABIs with TBI's done. He denies signs of infection. 12/21/2021 upon evaluation today patient appears to be doing well with regard to the wounds on his legs. Both are showing signs of significant improvement which is great news although I do believe some sharp debridement would be of benefit here as well. 12/28/2021 upon evaluation today patient appears to be doing well with regard to his wounds. Everything is showing signs of excellent improvement which I am very pleased about. I think that we are headed in the right direction here. Fortunately there does not appear to be any evidence of infection which is great news there is [name] little bit of hypergranulation. 01/04/2022 upon evaluation today patient appears to be doing well with regard to his wounds 2 of them are healed 1 is almost so and the other 1 is significantly better. Overall I am extremely pleased with where we stand and I think that he is making excellent progress here. I do not see any evidence of active infection locally nor systemically at this time. 01-16-2022 upon evaluation today patient's wound on the left leg is showing signs of doing quite well. Has not completely cleared at this point but it is much improved. Fortunately I do not see any signs of infection at this time. No fevers, chills, nausea, vomiting, or diarrhea. 01-23-2022 upon evaluation today patient's wound of the left leg actually appears to be pretty much completely healed which is great news. I do not see any signs of active infection locally or systemically which  is excellent. With that being said on the right leg what wound is measuring smaller the other 1 is [name] new wound that just showed up fortunately its not too bad. Has been using Xeroform here and that seems to be doing decently well which is great news. Unfortunately his blood pressure is significantly high we gave him the readings for the past 4-5 visits as well as [name] recommendation to make an appointment to go discuss this with his primary care provider patient states that he is going to look into doing this. 01-30-2022 upon evaluation today patient appears to be doing well with regard to his left leg everything appears to be healed. On the  right leg the more anterior wound is healed the more medial wound that I been concerned about [name] possible skin cancer unfortunately still does not look great to me. I do believe that we should probably do [name] biopsy I have talked about it with him [name] few times I think though it is probably time to go ahead and do this at this point. 02-09-2022 upon evaluation today patient appears to be doing well with regard to his legs. On the left this appears to be completely healed. On the right he does have 2 areas and be perfectly honest one of them is [name] skin cancer that he is going to the Mohs surgery clinic for the other seems to be healing nicely. Readmission: 08-02-2022 upon evaluation today patient appears for reevaluation here in our clinic concerning issues that he has been having with wounds over the bilateral lower extremities. I last saw him in May 2023 and at that point we had him completely healed. Unfortunately he is tells me this has broken down to some degree since that point. 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 He tells me this 1 seems to just be getting bigger  and bigger not improving. Fortunately he is not having any significant pain which is good news he does have quite [name] bit of swelling and he tells me that his fluid pills are not recommended for him to take daily but just in 3-day intervals here and there. 08-09-2022 upon evaluation today patient appears to be doing still somewhat poorly in regard to his legs although in general he does not appear to be feeling as good as he has been. Fortunately there does not appear to be any signs of infection which is good news. With that being said he is having some issues here with having and overall poor feeling in general which again is good I think going to be the biggest complicating factor. He actually seems to be coughing I do not hear any wheezing right now I did listen to his chest he did not have good airflow down low however makes me suspicious for bronchitis or even possibly pneumonia which could be part of what is going on here as well. Fortunately I do not see any evidence of anything worsening in regard to his legs but I definitely believe that he needs to continue with the compression wraps he took them off yesterday to shower has not had anything on for 24 hours that is why his legs are so swollen today. With regard to his pathology report I did review that it showed some squamous abnormality but no signs of distinct carcinoma. With that being said it was saying that it could be adjacent to [name] squamous cell carcinoma nonetheless my suggestion is can be that we have the patient take copy of this report and give it to his Mohs surgeon in order for them to see if there is anything they feel like needs to be done further. With that being said right now I feel like the primary thing is going to be for [place] to try to get his swelling down and keep that down into that hand since he is having so much drainage I believe we can have to bring him in for dressing changes twice  [name] week doing [name] nurse visit on  Mondays. 11/9; since the patient was last here he spent the night in the emergency room he received IV Lasix. Also received antibiotics although he was not discharged on either 1 of these. He also saw his cardiology office who put him on regular Lasix 20 mg [previously on as needed Lasix 20 mg]. Per our intake nurse the swelling in his legs is remarkably better but he still has bilateral lower extremity wounds. He still has wounds on the bilateral lower extremities most problematically on the left lateral calf. He has been using silver alginate under 3 layer compression. 08-23-2022 upon evaluation today patient appears to be doing much better than the last time I saw him 2 weeks ago. At that point I was very concerned about how he was doing he did see Dr. Caryn Section his primary care provider they got him on some blood pressure medication in general his color and overall appearance looks to be doing much improved compared to the last time I saw him. 09-04-2022 upon evaluation today patient appears to be doing well currently in regard to his wounds. Everything is showing signs of improvement which is great news. Fortunately there does not appear to be any signs of active infection locally or systemically at this time. No fevers, chills, nausea, vomiting, or diarrhea. 09-10-2022 upon evaluation today patient appears to be doing better in regard to his wounds although the [facility] was extremely stuck to the wound bed. Fortunately there does not appear to be any signs of infection locally or systemically at this time which is great news. No fevers, chills, nausea, vomiting, or diarrhea. 09-17-2022 upon evaluation today patient appears to be doing well currently in regard to his wounds in general. The right leg actually showing signs of excellent improvement and very pleased with where things stand in that regard. Fortunately I do not see any evidence of infection locally or systemically at this  time which is great news. No fevers, chills, nausea, vomiting, or diarrhea. 09-24-2022 upon evaluation today patient appears to be doing well currently in regard to his wounds. Things look to be doing quite well. 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 He is seeing his dermatologist tomorrow in that regard. With that being said other than that however he seems to really be making some pretty good progress here which is good news. No fevers, chills, nausea, vomiting, or diarrhea. 12/26; the patient has 2 open wounds remaining on the left leg. One is on the left anterior mid tibia and the other is on the right lateral knee just outside of the popliteal fossa. 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 The patient has been to see dermatology Dr. Evorn Gong who apparently is making him [name] referral to the [facility] surgery center. He does not yet have an appointment [name], [name] ([id number]) [id number] Page 10 of 12 10-12-2022 upon evaluation today patient appears to be doing well currently in regard to his wound. He has been tolerating the dressing changes without complication and overall feel like we are headed in the right direction. Fortunately I do not see any signs of infection locally or systemically at this time which is great news. No fevers, chills, nausea, vomiting, or diarrhea. 10-23-2022 upon evaluation today patient appears to be doing well currently  in regard to his wound. He has been tolerating the dressing changes without complication. Fortunately there does not appear to be any signs of active infection locally nor systemically which is great news and overall I am extremely pleased with where we stand currently. No fevers, chills, nausea, vomiting, or diarrhea. 10-26-2022 upon evaluation  today patient appears to be doing well currently in regard to his wounds. Everything is showing signs of improvement and this is great news. Fortunately I see no evidence of active infection systemically. He does seem to be doing much better in regard to the local infection in regard to his leg. The smell is also greatly improved. Overall I am extremely happy with where we stand today. This is after just [name] few days with the antibiotic on board. 11-05-2022 upon evaluation today patient appears to be doing well currently in regard to his wounds although the wound where they performed the Mohs surgery does look [name] little bit hyper granulated I think switching to [facility] may be better for him. He voiced understanding. Fortunately there does not appear to be any evidence of active infection locally nor systemically at this time. 11-12-2022 upon evaluation today patient appears to be doing better in regard to both wounds he has been tolerating the dressing changes without complication. There is no signs of infection and in general I think you are doing quite well. No fevers, chills, nausea, vomiting, or diarrhea. 11-20-2022 upon evaluation today patient appears to be doing well currently in regard to his wounds. He has been tolerating the dressing changes without complication. Fortunately there does not appear to be any signs of infection at this time. No fevers, chills, nausea, vomiting, or diarrhea. 11-27-2022 upon evaluation today patient appears to be doing somewhat poorly in regard to his leg in general he has [name] lot of areas where he looks like he had some spots that popped up. There with regard to new possible blisters. In general I am actually very concerned about the fact that the wrap may be causing some irritation here. I think that we can try to not do the wrap for 1 week, and given the prescription for mupirocin ointment which I will send into the pharmacy for  him. Objective Constitutional Well-nourished and well-hydrated in no acute distress. Vitals Time Taken: 9:32 AM, Height: 75 in, Weight: 225 lbs, BMI: 28.1, Temperature: 98.0 F, Pulse: 66 bpm, Respiratory Rate: 16 breaths/min, Blood Pressure: 199/76 mmHg. Respiratory normal breathing without difficulty. Psychiatric this patient is able to make decisions and demonstrates good insight into disease process. Alert and Oriented x 3. pleasant and cooperative. General Notes: Upon inspection patient's wound did require sharp debridement at both locations medial and lateral in April tolerated the debridement without complication postdebridement this is significantly improved which is great news. Integumentary (Hair, Skin) Wound #19 status is Open. Original cause of wound was Surgical Injury. The date acquired was: 10/19/2022. The wound has been in treatment 5 weeks. The wound is located on the Left,Medial Lower Leg. The wound measures 1.3cm length x 1.5cm width x 0.1cm depth; 1.532cm^2 area and 0.153cm^3 volume. There is Fat Layer (Subcutaneous Tissue) exposed. There is [name] large amount of serosanguineous drainage noted. The wound margin is flat and intact. There is medium (34-66%) red granulation within the wound bed. There is [name] medium (34-66%) amount of necrotic tissue within the wound bed including Adherent Slough. Wound #9 status is Open. Original cause of wound was Gradually Appeared. The date acquired was: 01/20/2022. The  wound has been in treatment 16 weeks. The wound is located on the Left,Lateral Lower Leg. The wound measures 0.5cm length x 0.4cm width x 0.1cm depth; 0.157cm^2 area and 0.016cm^3 volume. There is Fat Layer (Subcutaneous Tissue) exposed. There is [name] medium amount of serosanguineous drainage noted. There is small (1-33%) red granulation within the wound bed. There is [name] medium (34-66%) amount of necrotic tissue within the wound bed including Adherent Slough. 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 There was [name] Excisional Skin/Subcutaneous Tissue Debridement with [name] total area of 1.95 sq cm performed by [name], PA-C. With the following instrument(s): Curette Material removed includes Subcutaneous Tissue and Slough and. No specimens were taken.[name] Minimum amount of bleeding was controlled with Pressure. The procedure was tolerated well. Post Debridement Measurements: 1.3cm length x 1.5cm width x 0.2cm depth; 0.306cm^3 volume. Character of Wound/Ulcer Post Debridement is stable. Post procedure Diagnosis Wound #19: Same as Pre-Procedure Wound #9 Pre-procedure diagnosis of Wound #9 is [name] Venous Leg Ulcer located on the Left,Lateral Lower Leg .Severity of Tissue Pre Debridement is: Fat layer exposed. There was [name] Excisional Skin/Subcutaneous Tissue Debridement with [name] total area of 0.2 sq cm performed by [name], PA-C. With the following instrument(s): Curette Material removed includes Subcutaneous Tissue and Slough and. No specimens were taken.[name] Minimum amount of bleeding was controlled with Pressure. The procedure was tolerated well. Post Debridement Measurements: 0.5cm length x 0.4cm width x 0.2cm depth; 0.031cm^3 volume. Character of Wound/Ulcer Post Debridement is stable. Severity of Tissue Post Debridement is: Fat layer exposed. Post procedure Diagnosis Wound #9: Same as Pre-Procedure Plan Follow-up  Appointments: Return Appointment in 1 week. Nurse Visit as needed Bathing/ Shower/ Hygiene: May shower with wound dressing protected with water repellent cover or cast protector. No tub bath. Anesthetic (Use 'Patient Medications' Section for Anesthetic Order Entry): Wound #9 Left,Lateral Lower Leg: Lidocaine applied to wound bed Edema Control - Lymphedema / Segmental Compressive Device / Other: Patient to wear own compression stockings. Remove compression stockings every night before going to bed and put on every morning when getting up. - right lower leg Elevate, Exercise Daily and Avoid Standing for Long Periods of Time. Elevate legs to the level of the heart and pump ankles as often as possible Elevate leg(s) parallel to the floor when sitting. DO YOUR BEST to sleep in the bed at night. DO NOT sleep in your recliner. Long hours of sitting in [name] recliner leads to swelling of the legs and/or potential wounds on your backside. Additional Orders / Instructions: Follow Nutritious Diet and Increase Protein Intake Medications-Please add to medication list.: Topical Antibiotic - Mupirocin The following medication(s) was prescribed: mupirocin topical 2 % ointment ointment topical once daily applied in [name] thin film to the left leg where there are small open wounds starting 11/27/2022 WOUND #19: - Lower Leg Wound Laterality: Left, Medial Prim Dressing: Hydrofera Blue Ready Transfer Foam, 2.5x2.5 (in/in) 1 x Per Day/30 Days ary Discharge Instructions: Apply Hydrofera Blue Ready to wound bed as directed Secondary Dressing: (BORDER) Zetuvit Plus  SILICONE BORDER Dressing 5x5 (in/in) 1 x Per Day/30 Days Discharge Instructions: Please do not put silicone bordered dressings under wraps. Use non-bordered dressing only. WOUND #9: - Lower Leg Wound Laterality: Left, Lateral Prim Dressing: Hydrofera Blue Ready Transfer Foam, 2.5x2.5 (in/in) 1 x Per Day/30 Days ary Discharge Instructions: Apply Hydrofera  Blue Ready to wound bed as directed Secondary Dressing: (BORDER) Zetuvit Plus SILICONE BORDER Dressing 5x5 (in/in) 1 x Per Day/30 Days Discharge Instructions: Please do not put silicone bordered dressings under wraps. Use non-bordered dressing only. 1. I am good recommend currently based on what I am seeing that we go ahead and continue with the [facility] followed by the bordered foam dressing which I think is still doing very well here will not put him in the compression wrap however. 2. I am good recommend the mupirocin ointment to be applied to the open areas on his legs in general he can just do [name] thin film over the leg in general which will be beneficial here. I think that he should tolerate this quite well to be perfectly honest and to be frank I think that this will probably allow these areas to seal up pretty quickly that is my hope anyway. He is in agreement with plan he is going cover these with ABD pads and then secured with rolled gauze and will see about ordering supplies for him as well. We will see patient back for reevaluation in 1 week here in the clinic. If anything worsens or changes patient will contact our office for additional recommendations. Electronic Signature(s) Signed: 11/27/2022 10:47:53 AM By: [name] Keeler PA-C Entered By: [name] on 11/27/2022 10:47:53 Raidyn, Bielinski [place] [name] ([id number]) [id number] Page 12 of 12 -------------------------------------------------------------------------------- SuperBill Details Patient Name: Date of Service: [name] Rhetta [name] 11/27/2022 Medical Record Number: [id number] Patient Account Number: [id number] Date of Birth/Sex: Treating RN: 11-11-1945 (77 y.o. [name] Primary Care Provider: Lelon [name] Other Clinician: Referring Provider: Treating Provider/Extender: [name] in Treatment: 16 Diagnosis Coding ICD-10 Codes Code Description [phone] Chronic  venous hypertension (idiopathic) with ulcer and inflammation of bilateral lower extremity L97.822 Non-pressure chronic ulcer of other part of left lower leg with fat layer exposed L97.812 Non-pressure chronic ulcer of other part of right lower leg with fat layer exposed I73.89 Other specified peripheral vascular diseases I48.0 Paroxysmal atrial fibrillation I10 Essential (primary) hypertension Facility Procedures : CPT4 Code: [id number] Description: [id number] - DEB SUBQ TISSUE 20 SQ CM/< ICD-10 Diagnosis Description L97.822 Non-pressure chronic ulcer of other part of left lower leg with fat layer expo Modifier: sed Quantity: 1 Physician Procedures : CPT4 Code Description Modifier [id number] - WC PHYS LEVEL 4 - EST PT 25 ICD-10 Diagnosis Description 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 L97.822 Non-pressure chronic ulcer of  other part of left lower leg with fat layer exposed L97.812 Non-pressure chronic ulcer of other part of right lower leg with fat layer exposed I73.89 Other specified peripheral vascular diseases Quantity: 1 : [id number] - WC PHYS SUBQ TISS 20 SQ CM ICD-10 Diagnosis Description L97.822 Non-pressure chronic ulcer of other part of left lower leg with fat layer exposed Quantity: 1 Electronic Signature(s) Signed: 11/27/2022 10:48:15 AM By: [name] Keeler PA-C Entered By: [name] on 11/27/2022 10:48:15

## 2022-11-28 NOTE — Progress Notes (Signed)
[name], [name] ([id number]) [id number] Page 1 of 9 Visit Report for 11/27/2022 Arrival Information Details Patient Name: Date of Service: [name] 11/27/2022 9:30 [name] Medical Record Number: [id number] Patient Account Number: [id number] Date of Birth/Sex: Treating RN: 23-Feb-1946 (77 y.o. [name] Primary Care Alezander Dimaano: [name] Other Clinician: Referring Anuhea Gassner: Treating Sarafina Puthoff/Extender: [name] in Treatment: 54 Visit Information History Since Last Visit Added or deleted any medications: No Patient Arrived: Ambulatory Any new allergies or adverse reactions: No Arrival Time: 09:29 Had [name] fall or experienced change in No Accompanied By: self activities of daily living that may affect Transfer Assistance: None risk of falls: Patient Identification Verified: Yes Hospitalized since last visit: No Secondary Verification Process Completed: Yes Has Dressing in Place as Prescribed: Yes Patient Requires Transmission-Based No Has Compression in Place as Prescribed: Yes Precautions: Pain Present Now: No Patient Has Alerts: Yes Patient Alerts: Patient on Blood Thinner 12/13/21 TBI [name]; NOT diabetic HISTORY OF SKIN CANCERS Electronic Signature(s) Signed: 11/27/2022 4:52:42 PM By: [name] MSN RN CNS WTA Entered By: [name] on 11/27/2022 09:31:21 -------------------------------------------------------------------------------- Clinic Level of Care Assessment Details Patient Name: Date of Service: [name] 11/27/2022 9:30 [name] Medical Record Number: [id number] Patient Account Number: [id number] Date of Birth/Sex: Treating RN: 1945-10-27 (77 y.o. [name] Primary Care Tayja Manzer: [name] Other Clinician: Referring Melysa Schroyer: Treating Judd Mccubbin/Extender: [name] in Treatment: 16 Clinic Level of Care Assessment Items TOOL 1 Quantity Score []$   - 0 Use when EandM and Procedure is performed on INITIAL visit ASSESSMENTS - Nursing Assessment / Reassessment []$  - 0 General Physical Exam (combine w/ comprehensive assessment (listed just below) when performed on new pt. evals) []$  - 0 Comprehensive Assessment (HX, ROS, Risk Assessments, Wounds Hx, etc.) [name] ([id number]) [phone].pdf Page 2 of 9 ASSESSMENTS - Wound and Skin Assessment / Reassessment []$  - 0 Dermatologic / Skin Assessment (not related to wound area) ASSESSMENTS - Ostomy and/or Continence Assessment and Care []$  - 0 Incontinence Assessment and Management []$  - 0 Ostomy Care Assessment and Management (repouching, [name], systems, Records, [name], [name], etc. , [name], [name], [name], cushion, seat, etc.) INTERVENTIONS - Miscellaneous []$  - 0 External ear exam []$  - 0 Patient Transfer (multiple staff / Civil Service fast streamer / Similar devices) []$  - 0 Simple Staple / Suture removal (25 or less) []$  - 0 Complex Staple / Suture removal (26 or more) []$  - 0 Hypo/Hyperglycemic Management (do not check if billed separately) []$   - 0 Ankle / Brachial Index (ABI) - do not check if billed separately Has the patient been seen at the  hospital within the last three years: Yes Total Score: 0 Level Of Care: ____ Electronic Signature(s) Signed: 11/27/2022 4:52:42 PM By: [name] MSN RN CNS WTA Entered By: [name] on 11/27/2022 10:16:07 -------------------------------------------------------------------------------- Encounter Discharge Information Details Patient Name: Date of Service: [name], [name] 11/27/2022 9:30 [name] Medical Record Number: [id number] Patient Account Number: [id number] Date of Birth/Sex: Treating RN: 1946-08-12 (77 y.o. [name] Primary Care Alliah Boulanger: [name] Other Clinician: Referring Fenris Cauble: Treating Keaghan Bowens/Extender: Ivory, [name], [place] ([id number]) [phone].pdf Page 3 of 9 Weeks in Treatment: 16 Encounter Discharge Information Items Post Procedure Vitals Discharge Condition: Stable Temperature (F): 98.0 Ambulatory Status: Ambulatory Pulse (bpm): 66 Discharge Destination: Home Respiratory Rate (breaths/min): 16 Transportation: Private Auto Blood Pressure (mmHg): 199/76 Accompanied By: self Schedule Follow-up Appointment: Yes Clinical Summary of Care: Electronic Signature(s) Signed: 11/27/2022 12:40:59 PM By: [name] MSN RN CNS WTA Entered By: [name] on 11/27/2022 12:40:59 -------------------------------------------------------------------------------- Lower Extremity Assessment Details Patient Name: Date of Service: [name], [name] 11/27/2022 9:30 [name] Medical Record Number: [id number] Patient Account Number: [id number] Date of Birth/Sex: Treating RN: [birth year] (77 y.o. [name] Primary Care [place] Stevens: [name] Other Clinician: Referring Takari Duncombe: Treating [place] [name]/Extender: [name] in Treatment: 16 Edema Assessment Assessed: [name]: No] [name]: No] [Left:  Edema] [Right: :] Calf Left: Right: Point of Measurement: 34 cm From Medial Instep 35.2 cm Ankle Left: Right: Point of Measurement: 12 cm From Medial Instep 23 cm Vascular Assessment Pulses: Dorsalis Pedis Palpable: [Left:Yes] Electronic Signature(s) Signed: 11/27/2022 4:52:42 PM By: [name] MSN RN CNS WTA Entered By: [name] on 11/27/2022 09:47:11 [name] ([id number]) [id number] Page 4 of 9 -------------------------------------------------------------------------------- Multi Wound Chart Details Patient Name: Date of Service: [name] 11/27/2022 9:30 [name] Medical Record Number: [id number] Patient Account Number: [id number] Date of Birth/Sex: Treating RN: 03/31/1946 (77 y.o. [name] Primary Care Juriel Cid: [name] Other Clinician: Referring Nikesh Teschner: Treating Loris Seelye/Extender: [name] in Treatment: 16 Vital Signs Height(in): 75 Pulse(bpm): 37 Weight(lbs): 225 Blood Pressure(mmHg): 199/76 Body Mass Index(BMI): 28.1 Temperature(F): 98.0 Respiratory Rate(breaths/min): 16 [19:Photos:] [[name]:[name]] Left, Medial Lower Leg Left, Lateral Lower Leg [name] Wound Location: Surgical Injury Gradually Appeared [name] Wounding Event: Malignant Wound Venous Leg Ulcer [name] Primary Etiology: Arrhythmia, Hypertension, Gout Arrhythmia, Hypertension, Gout [name] Comorbid History: 10/19/2022 01/20/2022 [name] Date Acquired: 5 16 [name] Weeks of Treatment: Open Open [name] Wound Status: No No [name] Wound Recurrence: 1.3x1.5x0.1 0.5x0.4x0.1 [name] Measurements L x W x D (cm) 1.532 0.157 [name] [name] (cm) : rea 0.153 0.016 [name] Volume (cm) : 76.00% 99.10% [name] % Reduction in [name] rea: 76.10% 99.50% [name] % Reduction in Volume: Full Thickness Without Exposed Full Thickness Without Exposed [name] Classification: Support Structures Support Structures Large Medium [name] Exudate Amount: Serosanguineous Serosanguineous [name] Exudate  Type: red, brown red, brown [name] Exudate Color: Flat and Intact [name] Wound Margin: Medium (34-66%) Small (1-33%) [name] Granulation Amount: Red Red [name] Granulation Quality: Medium (34-66%) Medium (34-66%) [name] Necrotic Amount: Fat Layer (Subcutaneous Tissue): Yes Fat Layer (Subcutaneous Tissue): Yes [name] Exposed Structures: Fascia: No Fascia: No Tendon: No Tendon: No Muscle: No Muscle: No Joint: No Joint: No Bone: No Bone: No None [name] Epithelialization: Treatment Notes Electronic Signature(s) Signed: 11/27/2022 4:52:42 PM By: [name] MSN RN CNS WTA Entered By: [name] on 11/27/2022 10:06:46 [name] ([id number]) [id number] Page 5 of 9 -------------------------------------------------------------------------------- Pain Assessment  Details Patient Name: Date of Service: [name] 11/27/2022 9:30 [name] Medical Record Number: [id number] Patient Account Number: [id number] Date of Birth/Sex: Treating RN: 05/17/1946 (77 y.o. [name] Primary Care Lanard Arguijo: [name] Other Clinician: Referring Alexcis Bicking: Treating Daxtyn Rottenberg/Extender: [name] in Treatment: 16 Active Problems Location of Pain Severity and Description of Pain Patient Has Paino No Site Locations Pain Management and Medication Current Pain Management: Electronic Signature(s) Signed: 11/27/2022 4:52:42 PM By: [name] MSN RN CNS WTA Entered By: [name] on 11/27/2022 09:34:32 -------------------------------------------------------------------------------- Patient/Caregiver Education Details Patient Name: Date of Service: [name] 2/20/2024andnbsp9:30 [name] Medical Record Number: [id number] Patient Account Number: [id number] Date of Birth/Gender: Treating RN: 12-Jul-1946 (77 y.o. [name] Primary Care Physician: [name] Other Clinician: Referring Physician: Treating Physician/Extender: [name] in Treatment: [address] [place] ([id number]) [phone].pdf Page 6 of 9 Education Assessment Education Provided To: Patient Education Topics Provided Wound/Skin Impairment: Handouts: Caring for Your Ulcer Methods: Explain/Verbal Responses: State content correctly Electronic Signature(s) Signed: 11/27/2022 4:52:42 PM By: [name] MSN RN CNS WTA Entered By: [name] on 11/27/2022 10:16:31 -------------------------------------------------------------------------------- Wound Assessment Details Patient Name: Date of Service: [name], [name] 11/27/2022 9:30 [name] Medical Record Number: [id number] Patient Account Number: [id number] Date of Birth/Sex: Treating RN: [birth year] (77 y.o. [name] Primary Care Eldena Dede: [name] Other Clinician: Referring Allan Bacigalupi: Treating Garald Rhew/Extender: [name] in Treatment: 16 Wound Status Wound Number: 19 Primary Etiology: Malignant Wound Wound Location: Left, Medial Lower Leg Wound Status: Open Wounding Event: Surgical Injury Comorbid History: Arrhythmia, Hypertension, Gout Date Acquired: 10/19/2022 Weeks Of Treatment: 5 Clustered Wound: No Photos Wound Measurements Length: (cm) 1.3 Width: (cm) 1.5 Depth: (cm) 0.1 Area: (cm) 1.532 Volume: (cm) 0.153 % Reduction in Area: 76% % Reduction in Volume: 76.1% Epithelialization: None Wound Description Classification: Full Thickness Without Exposed Support Structures Wound Margin: Flat and Intact Exudate Amount: Large [name], [name] ([id number]) Exudate Type: Serosanguineous Exudate Color: red, brown Foul Odor After Cleansing: No Slough/Fibrino Yes [id number] Page 7 of 9 Wound Bed Granulation Amount: Medium (34-66%) Exposed Structure Granulation Quality: Red Fascia Exposed: No Necrotic Amount: Medium (34-66%) Fat Layer (Subcutaneous Tissue) Exposed:  Yes Necrotic Quality: Adherent Slough Tendon Exposed: No Muscle Exposed: No Joint Exposed: No Bone Exposed: No Treatment Notes Wound #19 (Lower Leg) Wound Laterality: Left, Medial Cleanser Peri-Wound Care Topical Primary Dressing Hydrofera Blue Ready Transfer Foam, 2.5x2.5 (in/in) Discharge Instruction: Apply Hydrofera Blue Ready to wound bed as directed Secondary Dressing (BORDER) Zetuvit Plus SILICONE BORDER Dressing 4x4 (in/in) Discharge Instruction: Please do not put silicone bordered dressings under wraps. Use non-bordered dressing only. Secured With Compression Wrap Compression Stockings Environmental education officer) Signed: 11/27/2022 4:52:42 PM By: [name] MSN RN CNS WTA Entered By: [name] on 11/27/2022 09:45:36 -------------------------------------------------------------------------------- Wound Assessment Details Patient Name: Date of Service: [name], [name] 11/27/2022 9:30 [name] Medical Record Number: [id number] Patient Account Number: [id number] Date of Birth/Sex: Treating RN: [birth year] (77 y.o. [name] Primary Care Vinal Rosengrant: [name] Other Clinician: Referring Danie Diehl: Treating Canio Winokur/Extender: [name] in Treatment: 16 Wound Status Wound Number: 9 Primary Etiology: Venous Leg Ulcer Wound Location: Left, Lateral Lower Leg Wound Status: Open Wounding Event: Gradually Appeared Comorbid History: Arrhythmia, Hypertension, Gout Date Acquired: 01/20/2022 Weeks Of Treatment: 16 Clustered Wound: No Photos [name], [name] ([id number]) [phone].pdf Page 8 of  9 Wound Measurements Length: (cm) 0.5 Width: (cm) 0.4 Depth: (cm) 0.1 Area: (cm) 0.157 Volume: (cm) 0.016 % Reduction in Area: 99.1% % Reduction in Volume: 99.5% Wound Description Classification: Full Thickness Without Exposed Suppor Exudate Amount: Medium Exudate Type: Serosanguineous Exudate Color: red, brown t  Structures Slough/Fibrino Yes Wound Bed Granulation Amount: Small (1-33%) Exposed Structure Granulation Quality: Red Fascia Exposed: No Necrotic Amount: Medium (34-66%) Fat Layer (Subcutaneous Tissue) Exposed: Yes Necrotic Quality: Adherent Slough Tendon Exposed: No Muscle Exposed: No Joint Exposed: No Bone Exposed: No Treatment Notes Wound #9 (Lower Leg) Wound Laterality: Left, Lateral Cleanser Peri-Wound Care Topical Primary Dressing Hydrofera Blue Ready Transfer Foam, 2.5x2.5 (in/in) Discharge Instruction: Apply Hydrofera Blue Ready to wound bed as directed Secondary Dressing ABD Pad 5x9 (in/in) Discharge Instruction: Cover with ABD pad Secured With Medipore T - 64M Medipore H Soft Cloth Surgical T ape ape, 2x2 (in/yd) Kerlix Roll Sterile or Non-Sterile 6-ply 4.5x4 (yd/yd) Discharge Instruction: Apply Kerlix as directed Tubigrip Size D, 3x10 (in/yd) Compression Wrap Compression Stockings Add-Ons Electronic Signature(s) Signed: 11/27/2022 4:52:42 PM By: [name] MSN RN CNS WTA Entered By: [name] on 11/27/2022 09:46:00 [name] ([id number]) [id number] Page 9 of 9 -------------------------------------------------------------------------------- Vitals Details Patient Name: Date of Service: [name] [name] Scot [name] 11/27/2022 9:30 [name] Medical Record Number: [id number] Patient Account Number: [id number] Date of Birth/Sex: Treating RN: 1945/11/04 (77 y.o. [name] Primary Care Farron Watrous: [name] Other Clinician: Referring Kishana Battey: Treating Waylyn Tenbrink/Extender: [name] in Treatment: 16 Vital Signs Time Taken: 09:32 Temperature (F): 98.0 Height (in): 75 Pulse (bpm): 66 Weight (lbs): 225 Respiratory Rate (breaths/min): 16 Body Mass Index (BMI): 28.1 Blood Pressure (mmHg): 199/76 Reference Range: 80 - 120 mg / dl Electronic Signature(s) Signed: 11/27/2022 4:52:42 PM By: [name] MSN RN CNS  WTA Entered By: [name] on 11/27/2022 09:34:25

## 2022-12-04 ENCOUNTER — Encounter: Payer: Medicare PPO | Admitting: Physician Assistant

## 2022-12-04 DIAGNOSIS — L97822 Non-pressure chronic ulcer of other part of left lower leg with fat layer exposed: Secondary | ICD-10-CM | POA: Diagnosis not present

## 2022-12-04 DIAGNOSIS — I10 Essential (primary) hypertension: Secondary | ICD-10-CM | POA: Diagnosis not present

## 2022-12-04 DIAGNOSIS — I872 Venous insufficiency (chronic) (peripheral): Secondary | ICD-10-CM | POA: Diagnosis not present

## 2022-12-04 DIAGNOSIS — I87333 Chronic venous hypertension (idiopathic) with ulcer and inflammation of bilateral lower extremity: Secondary | ICD-10-CM | POA: Diagnosis not present

## 2022-12-04 DIAGNOSIS — I480 Paroxysmal atrial fibrillation: Secondary | ICD-10-CM | POA: Diagnosis not present

## 2022-12-04 DIAGNOSIS — I739 Peripheral vascular disease, unspecified: Secondary | ICD-10-CM | POA: Diagnosis not present

## 2022-12-04 DIAGNOSIS — L97812 Non-pressure chronic ulcer of other part of right lower leg with fat layer exposed: Secondary | ICD-10-CM | POA: Diagnosis not present

## 2022-12-04 DIAGNOSIS — I428 Other cardiomyopathies: Secondary | ICD-10-CM | POA: Diagnosis not present

## 2022-12-04 DIAGNOSIS — Z7901 Long term (current) use of anticoagulants: Secondary | ICD-10-CM | POA: Diagnosis not present

## 2022-12-05 DIAGNOSIS — M7022 Olecranon bursitis, left elbow: Secondary | ICD-10-CM | POA: Diagnosis not present

## 2022-12-05 NOTE — Progress Notes (Addendum)
[name], [name] ([id number]) [id number] Page 1 of 9 Visit Report for 12/04/2022 Chief Complaint Document Details Patient Name: Date of Service: [name] 12/04/2022 2:15 PM Medical Record Number: [id number] Patient Account Number: [id number] Date of Birth/Sex: Treating RN: August 05, 1946 (77 y.o. [name] Primary Care Provider: [name] Other Clinician: Referring Provider: Treating Provider/Extender: [name] in Treatment: 34 Information Obtained from: Patient Chief Complaint Bilateral LE Ulcers Electronic Signature(s) Signed: 12/04/2022 2:15:25 PM By: [name] PA-C Entered By: [name] on 12/04/2022 14:15:25 -------------------------------------------------------------------------------- HPI Details Patient Name: Date of Service: [name] Halliday [name] 12/04/2022 2:15 PM Medical Record Number: [id number] Patient Account Number: [id number] Date of Birth/Sex: Treating RN: [birth year] (77 y.o. [name] Primary Care Provider: [name] Other Clinician: Referring Provider: Treating Provider/Extender: [name] in Treatment: 48 History of Present Illness HPI Description: 77 year old male who has a past medical history of essential hypertension, chronic atrial fibrillation, peripheral vascular disease, nonischemic cardiomyopathy,venous stasis dermatitis, gouty arthropathy, basal cell carcinoma of the right lower extremity, benign prostatic hypertrophy, long- term use of anticoagulation therapy, hyperglycemia and exercise intolerance has never been a smoker. the patient has had a vascular workup over 7 years ago and said everything was normal at that stage. He does not have any chronic problems except for cardiac issues which he sees a cardiologist in [place]. 08/15/2017 -- arterial and venous duplex studies still pending. 08/23/2017 -- venous reflux studies done on  08/13/2017 shows venous incompetence throughout the left lower extremity deep system and focally at the left saphenofemoral junction. 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 No evidence of SVT or DVT in bilateral lower extremities The patient has an appointment at the end of the month to get his arterial duplex study done 09/05/2017 -- the patient was seen at the vein and vascular office yesterday by Angelena Form. ABI studies were notable for medial calcification and the toe brachial indices were normal and bilateral ankle-brachial) waveforms were normal with triphasic flow. After review of his venous studies he was not a candidate for laser ablation and his lymphedema was to be treated with compression stockings and lymphedema pump pumps 09/12/2017 -- had a low arterial study done at the [facility] -- unable to obtain reliable ABI is due to medial calcification. Bilateral toe BLANCH, [name] ([id number]) [id number] Page 2 of 9 indices were normal with the right being 1.01 and the left being 0.92 and the waveforms were triphasic bilaterally. he did get hold of 30-40 mm compression stockings but is unable to put these on. We will try and get him alternative compression stockings. 09/26/17- he is here in follow up evaluation of a right lower extremity ulcer;he is compliant in wearing compression stocking; ulcer almost epithelialized , anticipate healing next appointment Readmission: 11/17 point upon evaluation patient's wound currently that he is seeing [place] for today is a skin cancerous lesion that was cleared away by his dermatologist on the left medial calf region. He tells me that this is a very similar thing to what he had done previously in fact the last time he saw him in 2018 this was also what was going on at that point. Nonetheless he feels that based on what he seeing currently that this is just having a lot of harder  time healing although it is much closer to the surface than what he is experienced in the past.  He notes that the initial removal was in June 2022 which was this year this is now November and still has not closed. He does have some edema and definitely I think that there is some venous component to his slow healing here. Also think that we can do something better than Vaseline to try to help with getting this to clear up as quickly as possible. He does have a history of atrial fibrillation and is on Eliquis otherwise he really has no major medical problems that would affect wound healing. 09/07/2021 upon evaluation today patient actually appears to be doing significantly better after having wrapped him last week. Overall I think that this is making significant improvements at this time which is great news. I do not see any evidence of infection which is great news as well. No fevers, chills, nausea, vomiting, or diarrhea. 09/14/2021 upon evaluation today patient appears to be doing well with regard to his leg ulcer. He has been tolerating the dressing changes and overall I think that he is making excellent progress. I do not see any signs of active infection at this time. 09/21/2021 upon evaluation today patient actually appears to be making good progress with regard to his wound this is again measuring smaller today no debridement seems to be necessary. We have been using a silver collagen dressing and I think that is doing an awesome job. 09/28/2021 upon evaluation today patient appears to be doing well with regard to his leg currently. I do not see any signs of active infection at this time which is great news. No fevers, chills, nausea, vomiting, or diarrhea. I think this wound is very close to complete resolution. 10/12/2021 upon evaluation today patient actually appears to be doing awesome in regard to his leg ulcer. In fact this appears to be completely healed based on what I am seeing currently. I  do not see any evidence of active infection locally nor systemically at this time which is also great news. No fevers, chills, nausea, vomiting, or diarrhea. Readmission: 12/07/2021 upon evaluation today patient presents for readmission here in the clinic. He was discharged on 10/12/2021 is completely healed. Unfortunately this has reopened at this point and he is having continual issues with new blisters over both lower extremities. This is even worse than what we previously saw. Nonetheless we did actually check his ABIs today and it did reveal that his ABIs were 0.55 on the left and 0.57 on the right. Subsequently this is a definite change from his last arterial study which showed that he did have good blood flow at 1.01 on the right and 0.92 on the left and that was right at the beginning of 2019. Nonetheless based on what we see currently I do think he tolerated the 3 layer compression wrap but I do believe that we probably need to get him tested for his arterial flow in order to see where things stand and if there is something we can do there that would help prevent this from continue to be an ongoing issue. He did not utilize compression socks in the interim from when he was last here till this time. That something is probably going to need lifelong going forward as well. 3/9; patient presents for follow-up. He has no issues or complaints today. He tolerated the compression wrap well. He had ABIs with TBI's done. He denies signs of infection. 12/21/2021 upon evaluation today patient appears to be doing well with regard to the wounds on his legs. Both  are showing signs of significant improvement which is great news although I do believe some sharp debridement would be of benefit here as well. 12/28/2021 upon evaluation today patient appears to be doing well with regard to his wounds. Everything is showing signs of excellent improvement which I am very pleased about. I think that we are headed in the  right direction here. Fortunately there does not appear to be any evidence of infection which is great news there is a little bit of hypergranulation. 01/04/2022 upon evaluation today patient appears to be doing well with regard to his wounds 2 of them are healed 1 is almost so and the other 1 is significantly better. Overall I am extremely pleased with where we stand and I think that he is making excellent progress here. I do not see any evidence of active infection locally nor systemically at this time. 01-16-2022 upon evaluation today patient's wound on the left leg is showing signs of doing quite well. Has not completely cleared at this point but it is much improved. Fortunately I do not see any signs of infection at this time. No fevers, chills, nausea, vomiting, or diarrhea. 01-23-2022 upon evaluation today patient's wound of the left leg actually appears to be pretty much completely healed which is great news. I do not see any signs of active infection locally or systemically which is excellent. With that being said on the right leg what wound is measuring smaller the other 1 is a new wound that just showed up fortunately its not too bad. Has been using Xeroform here and that seems to be doing decently well which is great news. Unfortunately his blood pressure is significantly high we gave him the readings for the past 4-5 visits as well as a recommendation to make an appointment to go discuss this with his primary care provider patient states that he is going to look into doing this. 01-30-2022 upon evaluation today patient appears to be doing well with regard to his left leg everything appears to be healed. On the right leg the more anterior wound is healed the more medial wound that I been concerned about a possible skin cancer unfortunately still does not look great to me. I do believe that we should probably do a biopsy I have talked about it with him a few times I think though it is probably  time to go ahead and do this at this point. 02-09-2022 upon evaluation today patient appears to be doing well with regard to his legs. On the left this appears to be completely healed. On the right he does have 2 areas and be perfectly honest one of them is a skin cancer that he is going to the Mohs surgery clinic for the other seems to be healing nicely. Readmission: 08-02-2022 upon evaluation today patient appears for reevaluation here in our clinic concerning issues that he has been having with wounds over the bilateral lower extremities. I last saw him in May 2023 and at that point we had him completely healed. Unfortunately he is tells me this has broken down to some degree since that point. 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 He tells me this 1 seems to just be getting bigger and bigger not improving. Fortunately he is not having  any significant pain which is good news he does have quite a bit of swelling and he tells me that his fluid pills are not recommended for him to take daily but just in 3-day intervals here and there. 08-09-2022 upon evaluation today patient appears to be doing still somewhat poorly in regard to his legs although in general he does not appear to be feeling as good as he has been. Fortunately there does not appear to be any signs of infection which is good news. With that being said he is having some issues here with having and overall poor feeling in general which again is good I think going to be the biggest complicating factor. He actually seems to be coughing I do not hear any wheezing right now I did listen to his chest he did not have good airflow down low however makes me suspicious for bronchitis or even possibly pneumonia which could be part of what is going on  here as well. Fortunately I do not see any evidence of anything worsening in regard to his legs but I definitely believe that he needs to continue with the compression wraps he took them off yesterday to shower has not had anything on for 24 hours that is why his legs are so swollen today. With regard to his pathology report I did review that it showed some squamous abnormality but no signs of distinct carcinoma. With that being said it was [name], [name] ([name]) [id number] Page 3 of 9 saying that it could be adjacent to a squamous cell carcinoma nonetheless my suggestion is can be that we have the patient take copy of this report and give it to his Mohs surgeon in order for them to see if there is anything they feel like needs to be done further. With that being said right now I feel like the primary thing is going to be for [place] to try to get his swelling down and keep that down into that hand since he is having so much drainage I believe we can have to bring him in for dressing changes twice a week doing a nurse visit on Mondays. 11/9; since the patient was last here he spent the night in the emergency room he received IV Lasix. Also received antibiotics although he was not discharged on either 1 of these. He also saw his cardiology office who put him on regular Lasix 20 mg [previously on as needed Lasix 20 mg]. Per our intake nurse the swelling in his legs is remarkably better but he still has bilateral lower extremity wounds. He still has wounds on the bilateral lower extremities most problematically on the left lateral calf. He has been using silver alginate under 3 layer compression. 08-23-2022 upon evaluation today patient appears to be doing much better than the last time I saw him 2 weeks ago. At that point I was very concerned about how he was doing he did see Dr. Caryn Section his primary care provider they got him on some blood pressure medication in general his  color and overall appearance looks to be doing much improved compared to the last time I saw him. 09-04-2022 upon evaluation today patient appears to be doing well currently in regard to his wounds. Everything is showing signs of improvement which is great news. Fortunately there does not appear to be any signs of active infection locally or systemically at this time. No fevers, chills, nausea, vomiting, or diarrhea. 09-10-2022 upon evaluation today patient appears to be doing  better in regard to his wounds although the [facility] was extremely stuck to the wound bed. Fortunately there does not appear to be any signs of infection locally or systemically at this time which is great news. No fevers, chills, nausea, vomiting, or diarrhea. 09-17-2022 upon evaluation today patient appears to be doing well currently in regard to his wounds in general. The right leg actually showing signs of excellent improvement and very pleased with where things stand in that regard. Fortunately I do not see any evidence of infection locally or systemically at this time which is great news. No fevers, chills, nausea, vomiting, or diarrhea. 09-24-2022 upon evaluation today patient appears to be doing well currently in regard to his wounds. Things look to be doing quite well. With that being said he did have a result unfortunately on the pathology which showed that he did have a squamous cell carcinoma noted on the biopsy sample I sent last week. He is seeing his dermatologist tomorrow in that regard. With that being said other than that however he seems to really be making some pretty good progress here which is good news. No fevers, chills, nausea, vomiting, or diarrhea. 12/26; the patient has 2 open wounds remaining on the left leg. One is on the left anterior mid tibia and the other is on the right lateral knee just outside of the popliteal fossa. The latter wound apparently has been biopsied showing squamous cell  carcinoma. The patient has been to see dermatology Dr. Evorn Gong who apparently is making him a referral to the [facility] Mohs surgery center. He does not yet have an appointment 10-12-2022 upon evaluation today patient appears to be doing well currently in regard to his wound. He has been tolerating the dressing changes without complication and overall feel like we are headed in the right direction. Fortunately I do not see any signs of infection locally or systemically at this time which is great news. No fevers, chills, nausea, vomiting, or diarrhea. 10-23-2022 upon evaluation today patient appears to be doing well currently in regard to his wound. He has been tolerating the dressing changes without complication. Fortunately there does not appear to be any signs of active infection locally nor systemically which is great news and overall I am extremely pleased with where we stand currently. No fevers, chills, nausea, vomiting, or diarrhea. 10-26-2022 upon evaluation today patient appears to be doing well currently in regard to his wounds. Everything is showing signs of improvement and this is great news. Fortunately I see no evidence of active infection systemically. He does seem to be doing much better in regard to the local infection in regard to his leg. The smell is also greatly improved. Overall I am extremely happy with where we stand today. This is after just a few days with the antibiotic on board. 11-05-2022 upon evaluation today patient appears to be doing well currently in regard to his wounds although the wound where they performed the Mohs surgery does look a little bit hyper granulated I think switching to [facility] may be better for him. He voiced understanding. Fortunately there does not appear to be any evidence of active infection locally nor systemically at this time. 11-12-2022 upon evaluation today patient appears to be doing better in regard to both wounds he has been tolerating  the dressing changes without complication. There is no signs of infection and in general I think you are doing quite well. No fevers, chills, nausea, vomiting, or diarrhea. 11-20-2022 upon  evaluation today patient appears to be doing well currently in regard to his wounds. He has been tolerating the dressing changes without complication. Fortunately there does not appear to be any signs of infection at this time. No fevers, chills, nausea, vomiting, or diarrhea. 11-27-2022 upon evaluation today patient appears to be doing somewhat poorly in regard to his leg in general he has a lot of areas where he looks like he had some spots that popped up. There with regard to new possible blisters. In general I am actually very concerned about the fact that the wrap may be causing some irritation here. I think that we can try to not do the wrap for 1 week, and given the prescription for mupirocin ointment which I will send into the pharmacy for him. 12-04-2022 upon evaluation today patient appears to be doing well currently in regard to his wounds in fact he appears to be pretty much completely healed based on what I am seeing at this point. I do not see any signs of active infection locally nor systemically at this time which is great news. No fevers, chills, nausea, vomiting, or diarrhea. Electronic Signature(s) Signed: 12/05/2022 4:08:41 PM By: [name] PA-C Entered By: [name] on 12/05/2022 16:08:41 Physical Exam Details -------------------------------------------------------------------------------- [name] ([id number]) [id number] Page 4 of 9 Patient Name: Date of Service: [name] 12/04/2022 2:15 PM Medical Record Number: [id number] Patient Account Number: [id number] Date of Birth/Sex: Treating RN: [birth year] (77 y.o. [name] Primary Care Provider: [name] Other Clinician: Referring Provider: Treating Provider/Extender: [name] in Treatment: 55 Constitutional Well-nourished and well-hydrated in no acute distress. Respiratory normal breathing without difficulty. Psychiatric this patient is able to make decisions and demonstrates good insight into disease process. Alert and Oriented x 3. pleasant and cooperative. Notes Upon inspection patient's wound bed actually showed signs of good granulation epithelization at this point. Fortunately I do not see any signs of active infection locally or systemically which is great news and in general I think that the patient is making some really good progress here Electronic Signature(s) Signed: 12/05/2022 4:08:57 PM By: [name] PA-C Entered By: [name] on 12/05/2022 16:08:57 -------------------------------------------------------------------------------- Physician Orders Details Patient Name: Date of Service: [name] [place], [name] 12/04/2022 2:15 PM Medical Record Number: [id number] Patient Account Number: [id number] Date of Birth/Sex: Treating RN: [birth year] (77 y.o. [name] Primary Care Provider: [name] Other Clinician: Referring Provider: Treating Provider/Extender: [name] in Treatment: [phone] Verbal / Phone Orders: No Diagnosis Coding ICD-10 Coding Code Description [phone] Chronic venous hypertension (idiopathic) with ulcer and inflammation of bilateral lower extremity L97.822 Non-pressure chronic ulcer of other part of left lower leg with fat layer exposed L97.812 Non-pressure chronic ulcer of other part of right lower leg with fat layer exposed I73.89 Other specified peripheral vascular diseases I48.0 Paroxysmal atrial fibrillation I10 Essential (primary) hypertension Discharge From [facility] Services Discharge from [facility] Complete - Apply A and D ointment or vaseline to the healed areas daily. Call if either wound re- opens Follow-up Appointments ppointment in 3  weeks. - Follow up if needed. If not call and cancel Return A Electronic Signature(s) Signed: 12/05/2022 5:10:28 PM By: [name] MSN RN CNS WTA Signed: 12/06/2022 5:15:32 PM By: [name] PA-C Entered By: [name] on 12/04/2022 15:31:00 LEVELL, UPLINGER [name] ([id number]) [id number] Page 5 of 9 -------------------------------------------------------------------------------- Problem List Details Patient  Name: Date of Service: [name] 12/04/2022 2:15 PM Medical Record Number: [name] Patient Account Number: [id number] Date of Birth/Sex: Treating RN: [birth year] (77 y.o. [name] Primary Care Provider: [name] Other Clinician: Referring Provider: Treating Provider/Extender: [name] in Treatment: 17 Active Problems ICD-10 Encounter Code Description Active Date MDM Diagnosis [phone] Chronic venous hypertension (idiopathic) with ulcer and inflammation of 08/02/2022 No Yes bilateral lower extremity L97.822 Non-pressure chronic ulcer of other part of left lower leg with fat layer 08/02/2022 No Yes exposed L97.812 Non-pressure chronic ulcer of other part of right lower leg with fat layer 08/02/2022 No Yes exposed I73.89 Other specified peripheral vascular diseases 08/02/2022 No Yes I48.0 Paroxysmal atrial fibrillation 08/02/2022 No Yes I10 Essential (primary) hypertension 08/02/2022 No Yes Inactive Problems Resolved Problems Electronic Signature(s) Signed: 12/04/2022 2:15:21 PM By: [name] PA-C Entered By: [name] on 12/04/2022 14:15:21 [name] ([name]) [id number] Page 6 of 9 -------------------------------------------------------------------------------- Progress Note Details Patient Name: Date of Service: [name] Amesti. 12/04/2022 2:15 PM Medical Record Number: [name] Patient Account Number: [id number] Date of Birth/Sex: Treating  RN: January 29, 1946 (77 y.o. [name] Primary Care Provider: [name] Other Clinician: Referring Provider: Treating Provider/Extender: [name] in Treatment: 45 Subjective Chief Complaint Information obtained from Patient Bilateral LE Ulcers History of Present Illness (HPI) 77 year old male who has a past medical history of essential hypertension, chronic atrial fibrillation, peripheral vascular disease, nonischemic cardiomyopathy,venous stasis dermatitis, gouty arthropathy, basal cell carcinoma of the right lower extremity, benign prostatic hypertrophy, long-term use of anticoagulation therapy, hyperglycemia and exercise intolerance has never been a smoker. the patient has had a vascular workup over 7 years ago and said everything was normal at that stage. He does not have any chronic problems except for cardiac issues which he sees a cardiologist in [place]. 08/15/2017 -- arterial and venous duplex studies still pending. 08/23/2017 -- venous reflux studies done on 08/13/2017 shows venous incompetence throughout the left lower extremity deep system and focally at the left saphenofemoral junction. 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 No evidence of SVT or DVT in bilateral lower extremities The patient has an appointment at the end of the month to get his arterial duplex study done 09/05/2017 -- the patient was seen at the vein and vascular office yesterday by Angelena Form. ABI studies were notable for medial calcification and the toe brachial indices were normal and bilateral ankle-brachial) waveforms were normal with triphasic flow. After review of his venous studies he was not a candidate for laser ablation and his lymphedema was to be treated with compression stockings and lymphedema pump pumps 09/12/2017 -- had a low arterial study done at the [facility] -- unable to obtain reliable ABI is due to medial  calcification. Bilateral toe indices were normal with the right being 1.01 and the left being 0.92 and the waveforms were triphasic bilaterally. he did get hold of 30-40 mm compression stockings but is unable to put these on. We will try and get him alternative compression stockings. 09/26/17- he is here in follow up evaluation of a right lower extremity ulcer;he is compliant in wearing compression stocking; ulcer almost epithelialized , anticipate healing next appointment Readmission: 11/17 point upon evaluation patient's wound currently that he is seeing [place] for today is a skin cancerous lesion that was cleared away by his dermatologist on the left medial calf region. He tells  me that this is a very similar thing to what he had done previously in fact the last time he saw him in 2018 this was also what was going on at that point. Nonetheless he feels that based on what he seeing currently that this is just having a lot of harder time healing although it is much closer to the surface than what he is experienced in the past. He notes that the initial removal was in June 2022 which was this year this is now November and still has not closed. He does have some edema and definitely I think that there is some venous component to his slow healing here. Also think that we can do something better than Vaseline to try to help with getting this to clear up as quickly as possible. He does have a history of atrial fibrillation and is on Eliquis otherwise he really has no major medical problems that would affect wound healing. 09/07/2021 upon evaluation today patient actually appears to be doing significantly better after having wrapped him last week. Overall I think that this is making significant improvements at this time which is great news. I do not see any evidence of infection which is great news as well. No fevers, chills, nausea, vomiting, or diarrhea. 09/14/2021 upon evaluation today patient appears to be  doing well with regard to his leg ulcer. He has been tolerating the dressing changes and overall I think that he is making excellent progress. I do not see any signs of active infection at this time. 09/21/2021 upon evaluation today patient actually appears to be making good progress with regard to his wound this is again measuring smaller today no debridement seems to be necessary. We have been using a silver collagen dressing and I think that is doing an awesome job. 09/28/2021 upon evaluation today patient appears to be doing well with regard to his leg currently. I do not see any signs of active infection at this time which is great news. No fevers, chills, nausea, vomiting, or diarrhea. I think this wound is very close to complete resolution. 10/12/2021 upon evaluation today patient actually appears to be doing awesome in regard to his leg ulcer. In fact this appears to be completely healed based on what I am seeing currently. I do not see any evidence of active infection locally nor systemically at this time which is also great news. No fevers, chills, nausea, vomiting, or diarrhea. Readmission: 12/07/2021 upon evaluation today patient presents for readmission here in the clinic. He was discharged on 10/12/2021 is completely healed. Unfortunately this has reopened at this point and he is having continual issues with new blisters over both lower extremities. This is even worse than what we previously saw. Nonetheless we did actually check his ABIs today and it did reveal that his ABIs were 0.55 on the left and 0.57 on the right. Subsequently this is a definite change from his last arterial study which showed that he did have good blood flow at 1.01 on the right and 0.92 on the left and that was right at the beginning of 2019. Nonetheless based on what we see currently I do think he tolerated the 3 layer compression wrap but I do believe that we probably need to get him tested for his arterial flow in  order to see where things stand and if there is something we can do there that would help prevent this from continue to be an [name], [name] ([name]) [id number] Page 7 of  9 ongoing issue. He did not utilize compression socks in the interim from when he was last here till this time. That something is probably going to need lifelong going forward as well. 3/9; patient presents for follow-up. He has no issues or complaints today. He tolerated the compression wrap well. He had ABIs with TBI's done. He denies signs of infection. 12/21/2021 upon evaluation today patient appears to be doing well with regard to the wounds on his legs. Both are showing signs of significant improvement which is great news although I do believe some sharp debridement would be of benefit here as well. 12/28/2021 upon evaluation today patient appears to be doing well with regard to his wounds. Everything is showing signs of excellent improvement which I am very pleased about. I think that we are headed in the right direction here. Fortunately there does not appear to be any evidence of infection which is great news there is a little bit of hypergranulation. 01/04/2022 upon evaluation today patient appears to be doing well with regard to his wounds 2 of them are healed 1 is almost so and the other 1 is significantly better. Overall I am extremely pleased with where we stand and I think that he is making excellent progress here. I do not see any evidence of active infection locally nor systemically at this time. 01-16-2022 upon evaluation today patient's wound on the left leg is showing signs of doing quite well. Has not completely cleared at this point but it is much improved. Fortunately I do not see any signs of infection at this time. No fevers, chills, nausea, vomiting, or diarrhea. 01-23-2022 upon evaluation today patient's wound of the left leg actually appears to be pretty much completely healed  which is great news. I do not see any signs of active infection locally or systemically which is excellent. With that being said on the right leg what wound is measuring smaller the other 1 is a new wound that just showed up fortunately its not too bad. Has been using Xeroform here and that seems to be doing decently well which is great news. Unfortunately his blood pressure is significantly high we gave him the readings for the past 4-5 visits as well as a recommendation to make an appointment to go discuss this with his primary care provider patient states that he is going to look into doing this. 01-30-2022 upon evaluation today patient appears to be doing well with regard to his left leg everything appears to be healed. On the right leg the more anterior wound is healed the more medial wound that I been concerned about a possible skin cancer unfortunately still does not look great to me. I do believe that we should probably do a biopsy I have talked about it with him a few times I think though it is probably time to go ahead and do this at this point. 02-09-2022 upon evaluation today patient appears to be doing well with regard to his legs. On the left this appears to be completely healed. On the right he does have 2 areas and be perfectly honest one of them is a skin cancer that he is going to the Mohs surgery clinic for the other seems to be healing nicely. Readmission: 08-02-2022 upon evaluation today patient appears for reevaluation here in our clinic concerning issues that he has been having with wounds over the bilateral lower extremities. I last saw him in May 2023 and at that point we had him completely healed.  Unfortunately he is tells me this has broken down to some degree since that point. 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 He tells me this 1 seems to just be getting bigger and bigger not improving. Fortunately he is not having any significant pain which is good news he does have quite a bit of swelling and he tells me that his fluid pills are not recommended for him to take daily but just in 3-day intervals here and there. 08-09-2022 upon evaluation today patient appears to be doing still somewhat poorly in regard to his legs although in general he does not appear to be feeling as good as he has been. Fortunately there does not appear to be any signs of infection which is good news. With that being said he is having some issues here with having and overall poor feeling in general which again is good I think going to be the biggest complicating factor. He actually seems to be coughing I do not hear any wheezing right now I did listen to his chest he did not have good airflow down low however makes me suspicious for bronchitis or even possibly pneumonia which could be part of what is going on here as well. Fortunately I do not see any evidence of anything worsening in regard to his legs but I definitely believe that he needs to continue with the compression wraps he took them off yesterday to shower has not had anything on for 24 hours that is why his legs are so swollen today. With regard to his pathology report I did review that it showed some squamous abnormality but no signs of distinct carcinoma. With that being said it was saying that it could be adjacent to a squamous cell carcinoma nonetheless my suggestion is can be that we have the patient take copy of this report and give it to his Mohs surgeon in order for them to see if there is anything they feel like needs to be done further. With that being said right now I feel like the primary thing is going to be for [place] to try to get his swelling down and keep that down into that hand since he is having so much drainage I  believe we can have to bring him in for dressing changes twice a week doing a nurse visit on Mondays. 11/9; since the patient was last here he spent the night in the emergency room he received IV Lasix. Also received antibiotics although he was not discharged on either 1 of these. He also saw his cardiology office who put him on regular Lasix 20 mg [previously on as needed Lasix 20 mg]. Per our intake nurse the swelling in his legs is remarkably better but he still has bilateral lower extremity wounds. He still has wounds on the bilateral lower extremities most problematically on the left lateral calf. He has been using silver alginate under 3 layer compression. 08-23-2022 upon evaluation today patient appears to be doing much better than the last time I saw him 2 weeks ago. At that point I was very concerned about how he was doing he did see Dr. Caryn Section his primary care provider they got him on some blood pressure medication in general his color and overall appearance looks to be  doing much improved compared to the last time I saw him. 09-04-2022 upon evaluation today patient appears to be doing well currently in regard to his wounds. Everything is showing signs of improvement which is great news. Fortunately there does not appear to be any signs of active infection locally or systemically at this time. No fevers, chills, nausea, vomiting, or diarrhea. 09-10-2022 upon evaluation today patient appears to be doing better in regard to his wounds although the [facility] was extremely stuck to the wound bed. Fortunately there does not appear to be any signs of infection locally or systemically at this time which is great news. No fevers, chills, nausea, vomiting, or diarrhea. 09-17-2022 upon evaluation today patient appears to be doing well currently in regard to his wounds in general. The right leg actually showing signs of excellent improvement and very pleased with where things stand in that regard.  Fortunately I do not see any evidence of infection locally or systemically at this time which is great news. No fevers, chills, nausea, vomiting, or diarrhea. 09-24-2022 upon evaluation today patient appears to be doing well currently in regard to his wounds. Things look to be doing quite well. With that being said he did have a result unfortunately on the pathology which showed that he did have a squamous cell carcinoma noted on the biopsy sample I sent last week. He is seeing his dermatologist tomorrow in that regard. With that being said other than that however he seems to really be making some pretty good progress here which is good news. No fevers, chills, nausea, vomiting, or diarrhea. 12/26; the patient has 2 open wounds remaining on the left leg. One is on the left anterior mid tibia and the other is on the right lateral knee just outside of the popliteal fossa. 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 The patient has been to see dermatology Dr. Evorn Gong who apparently is making him a referral to the [facility]. He does not yet have an appointment 10-12-2022 upon evaluation today patient appears to be doing well currently in regard to his wound. He has been tolerating the dressing changes without complication and overall feel like we are headed in the right direction. Fortunately I do not see any signs of infection locally or systemically at this time which is great news. No fevers, chills, nausea, vomiting, or diarrhea. 10-23-2022 upon evaluation today patient appears to be doing well currently in regard to his wound. He has been tolerating the dressing changes without [name], [name] ([id number]) [id number] Page 8 of 9 complication. Fortunately there does not appear to be any signs of active infection locally nor systemically which is great news and overall I am extremely pleased with where we stand currently.  No fevers, chills, nausea, vomiting, or diarrhea. 10-26-2022 upon evaluation today patient appears to be doing well currently in regard to his wounds. Everything is showing signs of improvement and this is great news. Fortunately I see no evidence of active infection systemically. He does seem to be doing much better in regard to the local infection in regard to his leg. The smell is also greatly improved. Overall I am extremely happy with where we stand today. This is after just a few days with the antibiotic on board. 11-05-2022 upon evaluation today patient appears to be doing well currently in regard to his wounds although the wound where they performed the Mohs surgery does look a little bit hyper granulated I  think switching to [facility] may be better for him. He voiced understanding. Fortunately there does not appear to be any evidence of active infection locally nor systemically at this time. 11-12-2022 upon evaluation today patient appears to be doing better in regard to both wounds he has been tolerating the dressing changes without complication. There is no signs of infection and in general I think you are doing quite well. No fevers, chills, nausea, vomiting, or diarrhea. 11-20-2022 upon evaluation today patient appears to be doing well currently in regard to his wounds. He has been tolerating the dressing changes without complication. Fortunately there does not appear to be any signs of infection at this time. No fevers, chills, nausea, vomiting, or diarrhea. 11-27-2022 upon evaluation today patient appears to be doing somewhat poorly in regard to his leg in general he has a lot of areas where he looks like he had some spots that popped up. There with regard to new possible blisters. In general I am actually very concerned about the fact that the wrap may be causing some irritation here. I think that we can try to not do the wrap for 1 week, and given the prescription for mupirocin ointment  which I will send into the pharmacy for him. 12-04-2022 upon evaluation today patient appears to be doing well currently in regard to his wounds in fact he appears to be pretty much completely healed based on what I am seeing at this point. I do not see any signs of active infection locally nor systemically at this time which is great news. No fevers, chills, nausea, vomiting, or diarrhea. Objective Constitutional Well-nourished and well-hydrated in no acute distress. Vitals Time Taken: 2:25 PM, Height: 75 in, Weight: 225 lbs, BMI: 28.1, Temperature: 97.6 F, Pulse: 55 bpm, Respiratory Rate: 16 breaths/min, Blood Pressure: 157/54 mmHg. Respiratory normal breathing without difficulty. Psychiatric this patient is able to make decisions and demonstrates good insight into disease process. Alert and Oriented x 3. pleasant and cooperative. General Notes: Upon inspection patient's wound bed actually showed signs of good granulation epithelization at this point. Fortunately I do not see any signs of active infection locally or systemically which is great news and in general I think that the patient is making some really good progress here Integumentary (Hair, Skin) Wound #19 status is Healed - Epithelialized. Original cause of wound was Surgical Injury. The date acquired was: 10/19/2022. The wound has been in treatment 6 weeks. The wound is located on the Left,Medial Lower Leg. The wound measures 0cm length x 0cm width x 0cm depth; 0cm^2 area and 0cm^3 volume. There is Fat Layer (Subcutaneous Tissue) exposed. There is a large amount of serosanguineous drainage noted. The wound margin is flat and intact. There is medium (34-66%) red granulation within the wound bed. There is a medium (34-66%) amount of necrotic tissue within the wound bed. Wound #9 status is Healed - Epithelialized. Original cause of wound was Gradually Appeared. The date acquired was: 01/20/2022. The wound has been in treatment 17 weeks.  The wound is located on the Left,Lateral Lower Leg. The wound measures 0cm length x 0cm width x 0cm depth; 0cm^2 area and 0cm^3 volume. There is Fat Layer (Subcutaneous Tissue) exposed. There is a medium amount of serosanguineous drainage noted. There is small (1-33%) red granulation within the wound bed. There is a medium (34-66%) amount of necrotic tissue within the wound bed. 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 Call if either wound re-opens [name], [name] ([id number]) [id number] Page 9 of 9 Follow-up Appointments: Return Appointment in 3 weeks. - Follow up if needed. If not call and cancel 1. With regard to his leg everything appears to be pretty much completely healed based on what I am seeing. I am going to recommend that we have the patient going continue to monitor for any signs of infection or worsening. In fact based on what I am seeing I think that he is probably closed and in fact everything looks like is doing well he has been taking excellent care of this she is keeping it wrapped at home and using his own compression. 2. I am also going to recommend that the patient should continue to monitor for any signs of infection or worsening. Based on what I am seeing I think were doing quite well and hopeful he will continue to show signs of good improvement going forward. We will see the patient back for follow-up visit in 3 weeks just as a double  check to make sure everything remains closed. If he is doing well at that time he may not even need to follow-up at that point. He will contact the office and let us know. Electronic Signature(s) Signed: 12/05/2022 4:10:05 PM By: [name] PA-C Entered By: [name] on 12/05/2022 16:10:05 -------------------------------------------------------------------------------- SuperBill Details Patient Name: Date of Service: [name] [name] Scot [name] 12/04/2022 Medical Record Number: [id number] Patient Account Number: [id number] Date of Birth/Sex: Treating RN: [birth year] (77 y.o. 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 I73.89 Other specified peripheral vascular diseases I48.0 Paroxysmal atrial fibrillation I10 Essential (primary) hypertension Facility Procedures : CPT4 Code: [id number] Description: 99213 - WOUND CARE VISIT-LEV 3 EST PT Modifier: Quantity: 1 Physician Procedures : CPT4 Code Description Modifier [id number] - WC PHYS LEVEL 3 - EST PT ICD-10 Diagnosis Description 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 L97.822 Non-pressure chronic ulcer of other  part of left lower leg with fat layer exposed L97.812 Non-pressure 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

## 2022-12-05 NOTE — Progress Notes (Addendum)
KEVONN, DILORENZO [name] ([id number]) [id number] Page 1 of 10 Visit Report for 12/04/2022 Arrival Information Details Patient Name: Date of Service: [name] 12/04/2022 2:15 PM Medical Record Number: [id number] Patient Account Number: [id number] Date of Birth/Sex: Treating RN: 1946-02-23 (77 y.[name] 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 12/04/2022 2:15 PM Medical Record Number: [id number] Patient Account Number: [id number] Date of Birth/Sex: Treating RN: November 10, 1945 (77 y.[name] [name] Primary Care [place] Coll: [name] Other Clinician: Referring Jaclyn Andy: Treating Mohsin Crum/Extender: [name] in Treatment: 17 Clinic Level of Care Assessment Items TOOL 4 Quantity Score X- 1 0 Use when only an EandM is performed on FOLLOW-UP visit ASSESSMENTS - Nursing Assessment / Reassessment '[]'$  - 0 Reassessment of Co-morbidities (includes updates in patient status) '[]'$  - 0 Reassessment of Adherence to Treatment Plan ASSESSMENTS - Wound and  Skin Assessment / Reassessment [name], [name] ([id number]) [id number] Page 2 of 10 '[]'$  - 0 Simple Wound Assessment / Reassessment - one wound X- 2 5 Complex Wound Assessment / Reassessment - multiple wounds '[]'$  - 0 Dermatologic / Skin Assessment (not related to wound area) ASSESSMENTS - Focused Assessment '[]'$  - 0 Circumferential Edema Measurements - multi extremities '[]'$  - 0 Nutritional Assessment / Counseling / Intervention '[]'$  - 0 Lower Extremity Assessment (monofilament, tuning fork, pulses) '[]'$  - 0 Peripheral Arterial Disease Assessment (using hand held doppler) ASSESSMENTS - Ostomy and/or Continence Assessment and Care '[]'$  - 0 Incontinence Assessment and Management '[]'$  - 0 Ostomy Care Assessment and Management (repouching, etc.) PROCESS - Coordination of Care X - Simple Patient / Family Education for ongoing care 1 15 '[]'$  - 0 Complex (extensive) Patient / Family Education for ongoing care X- 1 10 Staff obtains Programmer, systems, Records, T Results / Process Orders est '[]'$  - 0 Staff telephones HHA, Nursing Homes / Clarify orders / etc '[]'$  - 0 Routine Transfer to another Facility (non-emergent condition) '[]'$  - 0 Routine [name] Admission (non-emergent condition) '[]'$  - 0 New Admissions / Biomedical engineer / Ordering NPWT Apligraf, etc. , '[]'$  - 0 Emergency [name] Admission (emergent condition) X- 1 10 Simple Discharge Coordination '[]'$  - 0 Complex (extensive) Discharge Coordination PROCESS - Special Needs '[]'$  - 0 Pediatric / Minor Patient Management '[]'$  - 0 Isolation Patient Management '[]'$  - 0 Hearing / Language / Visual special needs '[]'$  - 0 Assessment of Community assistance (transportation, [name], etc.) '[]'$  - 0 Additional assistance / Altered mentation '[]'$  - 0 Support Surface(s) Assessment (bed, cushion, seat, etc.) INTERVENTIONS - Wound Cleansing / Measurement '[]'$  - 0 Simple Wound Cleansing - one wound X- 2 5 Complex Wound Cleansing -  multiple wounds X- 1 5 Wound Imaging (photographs - any number of wounds) '[]'$  - 0 Wound Tracing (instead of photographs) '[]'$  - 0 Simple Wound Measurement -  one wound X- 2 5 Complex Wound Measurement - multiple wounds INTERVENTIONS - Wound Dressings X - Small Wound Dressing one or multiple wounds 2 10 '[]'$  - 0 Medium Wound Dressing one or multiple wounds '[]'$  - 0 Large Wound Dressing one or multiple wounds '[]'$  - 0 Application of Medications - topical '[]'$  - 0 Application of Medications - injection INTERVENTIONS - Miscellaneous '[]'$  - 0 External ear exam '[]'$  - 0 Specimen Collection (cultures, biopsies, blood, body fluids, etc.) [name], [name] ([id number]) [id number] Page 3 of 10 '[]'$  - 0 Specimen(s) / Culture(s) sent or taken to Lab for analysis '[]'$  - 0 Patient Transfer (multiple staff / Civil Service fast streamer / Similar devices) '[]'$  - 0 Simple Staple / Suture removal (25 or less) '[]'$  - 0 Complex Staple / Suture removal (26 or more) '[]'$  - 0 Hypo / Hyperglycemic Management (close monitor of Blood Glucose) '[]'$  - 0 Ankle / Brachial Index (ABI) - do not check if billed separately X- 1 5 Vital Signs Has the patient been seen at the [name] within the last three years: Yes Total Score: 95 Level Of Care: New/Established - Level 3 Electronic Signature(s) Unsigned Entered By: [name] on 12/04/2022 15:33:05 -------------------------------------------------------------------------------- Encounter Discharge Information Details Patient Name: Date of Service: [name] 12/04/2022 2:15 PM Medical Record Number: [id number] Patient Account Number: [id number] Date of Birth/Sex: Treating RN: 1946-08-13 (77 y.[name] [name] Primary Care Ilaria Much: [name] Other Clinician: Referring Garion Wempe: Treating Azka Steger/Extender: [name] in Treatment: 17 Encounter Discharge Information Items Discharge Condition: Stable Ambulatory Status:  Ambulatory Discharge Destination: Home Transportation: Private Auto Accompanied By: self Schedule Follow-up Appointment: Yes Clinical Summary of Care: Electronic Signature(s) Unsigned Entered By: [name] on 12/04/2022 15:34:56 Signature(s): [name], [name] ([id number]) [phone] Date(s): [phone].pdf Page 4 of 10 -------------------------------------------------------------------------------- Lower Extremity Assessment Details Patient Name: Date of Service: MO [place]. 12/04/2022 2:15 PM Medical Record Number: [id number] Patient Account Number: [id number] Date of Birth/Sex: Treating RN: [birth year] (65 y.[name] [name] Primary Care Viraaj Vorndran: [name] Other Clinician: Referring Dakisha Schoof: Treating Denee Boeder/Extender: [name] in Treatment: 17 Edema Assessment Assessed: Shirlyn Goltz: No] Patrice Paradise: No] [Left: Edema] [Right: :] Calf Left: Right: Point of Measurement: 34 cm From Medial Instep 36.3 cm Ankle Left: Right: Point of Measurement: 12 cm From Medial Instep 24.3 cm Vascular Assessment Pulses: Dorsalis Pedis Palpable: [Left:Yes] Electronic Signature(s) Unsigned Previous Signature: 12/04/2022 3:12:00 PM Version By: [name] MSN RN CNS WTA Entered By: [name] on 12/04/2022 15:19:32 -------------------------------------------------------------------------------- Multi Wound Chart Details Patient Name: Date of Service: MO [place], [name] 12/04/2022 2:15 PM Medical Record Number: [id number] Patient Account Number: [id number] Date of Birth/Sex: Treating RN: 29-Nov-1945 (68 y.[name] 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 [name] Primary Etiology: Arrhythmia, Hypertension, Gout Arrhythmia, Hypertension, Gout [name] Comorbid History: 10/19/2022 01/20/2022 [name] Date Acquired: 6 17 [name] Weeks of Treatment: Healed - Epithelialized Healed - Epithelialized [name] Wound Status: No No [name] Wound Recurrence: 0x0x0 0x0x0 [name] Measurements L x W x D (cm) 0 0 [name] A (cm) : rea 0 0  [name] Volume (cm) : 100.00% 100.00% [name] % Reduction in A rea: 100.00% 100.00% [name] % Reduction in Volume: Full Thickness Without Exposed Full Thickness Without Exposed [name] Classification: Support Structures Support Structures Large Medium [name] Exudate Amount: Serosanguineous Serosanguineous [name] Exudate Type: red, brown red, brown [name] Exudate Color: Flat and Intact [name] [name] Wound Margin: Medium (34-66%) Small (1-33%) [name] Granulation Amount: Red Red [name] Granulation Quality: Medium (34-66%) Medium (34-66%) [name] Necrotic Amount: Fat Layer (Subcutaneous Tissue): Yes Fat Layer (Subcutaneous Tissue): Yes [name] Exposed Structures: Fascia: No Fascia: No Tendon: No Tendon: No Muscle: No Muscle: No Joint: No Joint: No Bone: No Bone: No None [name] [name] Epithelialization: Treatment Notes Electronic Signature(s) Unsigned Previous Signature: 12/04/2022 3:12:00 PM Version By: [name] MSN RN CNS WTA Entered By: [name] on 12/04/2022 15:19:37 -------------------------------------------------------------------------------- Multi-Disciplinary Care Plan Details Patient Name: Date of Service: MO [place], [place] [name] 12/04/2022 2:15 PM Medical Record Number: [id number] Patient Account Number: [id number] Date of Birth/Sex: Treating RN: September 30, 1946 (76 y.[name] [name] Primary Care Maythe Deramo:  [name] Other Clinician: Referring Benard Minturn: Treating Isais Klipfel/Extender: [name] in Treatment: 17 Active Inactive Electronic Signature(s) Unsigned Entered By: [name] on 12/04/2022 15:33:40 Signature(s): [name], [name] ([id number]) [id number] Date(s): 1_Nursing_[id number].pdf Page 6 of 10 -------------------------------------------------------------------------------- Pain Assessment Details Patient Name: Date of Service: MO [place]. 12/04/2022 2:15 PM Medical Record Number: [id number] Patient Account Number: [id number] Date of Birth/Sex: Treating RN: 02/06/1946 (46 y.[name] [name] Primary Care Aleeah Greeno: [name] Other Clinician: Referring Hue Frick: Treating Beatriz Quintela/Extender: [name] in Treatment: 17 Active Problems Location of Pain Severity and Description of Pain Patient Has Paino No Site Locations Pain Management and Medication Current Pain Management: Electronic Signature(s) Signed: 12/04/2022 3:12:00 PM By: [name] MSN RN CNS WTA Entered By: [name] on 12/04/2022 14:28:42 -------------------------------------------------------------------------------- Patient/Caregiver Education Details Patient Name: Date of Service: [name] 2/27/2024andnbsp2:15 PM Medical Record Number: [id number] Patient Account Number: [id number] Date of Birth/Gender: Treating RN: 16-Mar-1946 (52 y.[name] [name] Primary Care Physician: [name] Other Clinician: Referring Physician: Treating Physician/Extender: [name] in Treatment: [address] [place] ([id number]) [id number].pdf Page 7 of 10 Education Assessment Education Provided To: Patient Education Topics Provided Wound/Skin Impairment: Handouts: Caring for Your Ulcer Methods: Explain/Verbal Responses: State content correctly Electronic Signature(s) Unsigned Entered By:  [name] on 12/04/2022 15:33:55 -------------------------------------------------------------------------------- Wound Assessment Details Patient Name: Date of Service: [name] 12/04/2022 2:15 PM Medical Record Number: [id number] Patient Account Number: [id number] Date of Birth/Sex: Treating RN: [birth year] (61 y.[name] [name] Primary Care Jahree Dermody: [name] Other Clinician: Referring Zhana Jeangilles: Treating Haniyah Maciolek/Extender: [name] in Treatment: 17 Wound Status Wound Number: 19 Primary Etiology: Malignant Wound Wound Location: Left, Medial Lower Leg Wound Status: Healed - Epithelialized Wounding Event: Surgical Injury Comorbid History: Arrhythmia, Hypertension, Gout Date Acquired: 10/19/2022 Weeks Of Treatment: 6 Clustered Wound: No Photos Wound Measurements Length: (cm) Width: (cm) Depth: (cm) Area: (cm) Volume: (cm) 0 % Reduction in Area: 100% 0 % Reduction in Volume: 100% 0 Epithelialization: None 0 0 Wound Description Classification: Full Thickness Without Exposed Support Structures [name], [name] ([id number]) Wound Margin: Flat and Intact Exudate Amount: Large Exudate Type: Serosanguineous Exudate Color: red, brown Foul Odor After Cleansing: No [id number].pdf Page 8 of 10 Slough/Fibrino Yes Wound Bed Granulation Amount: Medium (34-66%) Exposed Structure Granulation Quality: Red Fascia Exposed: No Necrotic Amount: Medium (34-66%) Fat  Layer (Subcutaneous Tissue) Exposed: Yes Tendon Exposed: No Muscle Exposed: No Joint Exposed: No Bone Exposed: No Treatment Notes Wound #19 (Lower Leg) Wound Laterality: Left, Medial Cleanser Peri-Wound Care Topical Primary Dressing Secondary Dressing Secured With Compression Wrap Compression Stockings Add-Ons Electronic Signature(s) Unsigned Previous Signature: 12/04/2022 3:12:00 PM Version By: [name] MSN RN CNS WTA Entered By: [name] on 12/04/2022 15:16:39 -------------------------------------------------------------------------------- Wound Assessment Details Patient Name: Date of Service: MO [place], [name] 12/04/2022 2:15 PM Medical Record Number: [id number] Patient Account Number: [id number] Date of Birth/Sex: Treating RN: [birth year] (36 y.[name] [name] Primary Care Ismaeel Arvelo: [name] Other Clinician: Referring Allison Deshotels: Treating Clearnce Leja/Extender: [name] in Treatment: 17 Wound Status Wound Number: 9 Primary Etiology: Venous Leg Ulcer Wound Location: Left, Lateral Lower Leg Wound Status: Healed - Epithelialized Wounding Event: Gradually Appeared Comorbid History: Arrhythmia, Hypertension, Gout Date Acquired: 01/20/2022 Weeks Of Treatment: 17 Clustered Wound: No Photos [name], [name] ([id number]) [id number].pdf Page 9 of 10 Wound Measurements Length: (cm) Width: (cm) Depth: (cm) Area: (cm) Volume: (cm) 0 % Reduction in Area: 100% 0 % Reduction in Volume: 100% 0 0 0 Wound Description Classification: Full Thickness Without Exposed Suppor Exudate Amount: Medium Exudate Type: Serosanguineous Exudate Color: red, brown t Structures Slough/Fibrino Yes Wound Bed Granulation Amount: Small (1-33%) Exposed Structure Granulation Quality: Red Fascia Exposed: No Necrotic Amount: Medium (34-66%) Fat Layer (Subcutaneous Tissue) Exposed: Yes Tendon Exposed: No Muscle Exposed: No Joint Exposed: No Bone Exposed: No Treatment Notes Wound #9 (Lower Leg) Wound Laterality: Left, Lateral Cleanser Peri-Wound Care Topical Primary Dressing Secondary Dressing Secured With Compression Wrap Compression Stockings Add-Ons Electronic Signature(s) Unsigned Previous Signature: 12/04/2022 3:12:00 PM Version By: [name] MSN RN CNS WTA Entered By: [name] on 12/04/2022 15:16:56 Signature(s): [name], [name] ([id number])  [id number] Date(s): 1_Nursing_[id number].pdf Page 10 of 10 -------------------------------------------------------------------------------- Vitals Details Patient Name: Date of Service: [name] 12/04/2022 2:15 PM Medical Record Number: [id number] Patient Account Number: [id number] Date of Birth/Sex: Treating RN: Nov 01, 1945 (24 y.[name] [name] Primary Care Dallyn Bergland: [name] Other Clinician: Referring Cyndel Griffey: Treating Varsha Knock/Extender: [name] in Treatment: 17 Vital Signs Time Taken: 14:25 Temperature (F): 97.6 Height (in): 75 Pulse (bpm): 55 Weight (lbs): 225 Respiratory Rate (breaths/min): 16 Body Mass Index (BMI): 28.1 Blood Pressure (mmHg): 157/54 Reference Range: 80 - 120 mg / dl Electronic Signature(s) Signed: 12/04/2022 3:12:00 PM By: [name] MSN RN CNS WTA Entered By: [name] on 12/04/2022 14:28:36

## 2022-12-13 DIAGNOSIS — E785 Hyperlipidemia, unspecified: Secondary | ICD-10-CM | POA: Diagnosis not present

## 2022-12-13 DIAGNOSIS — I4821 Permanent atrial fibrillation: Secondary | ICD-10-CM | POA: Diagnosis not present

## 2022-12-13 LAB — COMPREHENSIVE METABOLIC PANEL
ALT: 25 IU/L (ref 0–44)
AST: 21 IU/L (ref 0–40)
Albumin/Globulin Ratio: 1.6 (ref 1.2–2.2)
Albumin: 4.2 g/dL (ref 3.8–4.8)
Alkaline Phosphatase: 117 IU/L (ref 44–121)
BUN/Creatinine Ratio: 10 (ref 10–24)
BUN: 10 mg/dL (ref 8–27)
Bilirubin Total: 0.7 mg/dL (ref 0.0–1.2)
CO2: 26 mmol/L (ref 20–29)
Calcium: 9.9 mg/dL (ref 8.6–10.2)
Chloride: 100 mmol/L (ref 96–106)
Creatinine, Ser: 0.97 mg/dL (ref 0.76–1.27)
Globulin, Total: 2.7 g/dL (ref 1.5–4.5)
Glucose: 117 mg/dL — ABNORMAL HIGH (ref 70–99)
Potassium: 4.5 mmol/L (ref 3.5–5.2)
Sodium: 140 mmol/L (ref 134–144)
Total Protein: 6.9 g/dL (ref 6.0–8.5)
eGFR: 82 mL/min/{1.73_m2} (ref >59)

## 2022-12-13 LAB — LIPID PANEL
Chol/HDL Ratio: 2.5 ratio (ref 0.0–5.0)
Chol/HDL Ratio: 2.5 ratio (ref 0.0–5.0)
Cholesterol, Total: 125 mg/dL (ref 100–199)
Cholesterol, Total: 130 mg/dL (ref 100–199)
HDL: 51 mg/dL (ref >39)
HDL: 51 mg/dL (ref >39)
LDL Chol Calc (NIH): 61 mg/dL (ref 0–99)
LDL Chol Calc (NIH): 66 mg/dL (ref 0–99)
Triglycerides: 58 mg/dL (ref 0–149)
Triglycerides: 60 mg/dL (ref 0–149)
VLDL Cholesterol Cal: 13 mg/dL (ref 5–40)
VLDL Cholesterol Cal: 13 mg/dL (ref 5–40)

## 2022-12-13 LAB — HEPATIC FUNCTION PANEL
ALT: 25 IU/L (ref 0–44)
AST: 23 IU/L (ref 0–40)
Albumin: 4.2 g/dL (ref 3.8–4.8)
Alkaline Phosphatase: 122 IU/L — ABNORMAL HIGH (ref 44–121)
Bilirubin Total: 0.7 mg/dL (ref 0.0–1.2)
Bilirubin, Direct: 0.23 mg/dL (ref 0.00–0.40)
Total Protein: 6.9 g/dL (ref 6.0–8.5)

## 2022-12-18 DIAGNOSIS — D2261 Melanocytic nevi of right upper limb, including shoulder: Secondary | ICD-10-CM | POA: Diagnosis not present

## 2022-12-18 DIAGNOSIS — L570 Actinic keratosis: Secondary | ICD-10-CM | POA: Diagnosis not present

## 2022-12-18 DIAGNOSIS — L814 Other melanin hyperpigmentation: Secondary | ICD-10-CM | POA: Diagnosis not present

## 2022-12-18 DIAGNOSIS — L853 Xerosis cutis: Secondary | ICD-10-CM | POA: Diagnosis not present

## 2022-12-18 DIAGNOSIS — C4442 Squamous cell carcinoma of skin of scalp and neck: Secondary | ICD-10-CM | POA: Diagnosis not present

## 2022-12-18 DIAGNOSIS — D225 Melanocytic nevi of trunk: Secondary | ICD-10-CM | POA: Diagnosis not present

## 2022-12-18 DIAGNOSIS — D2262 Melanocytic nevi of left upper limb, including shoulder: Secondary | ICD-10-CM | POA: Diagnosis not present

## 2022-12-18 DIAGNOSIS — D485 Neoplasm of uncertain behavior of skin: Secondary | ICD-10-CM | POA: Diagnosis not present

## 2022-12-25 ENCOUNTER — Ambulatory Visit: Payer: Medicare PPO | Admitting: Physician Assistant

## 2022-12-27 DIAGNOSIS — C44529 Squamous cell carcinoma of skin of other part of trunk: Secondary | ICD-10-CM | POA: Diagnosis not present

## 2022-12-27 DIAGNOSIS — D234 Other benign neoplasm of skin of scalp and neck: Secondary | ICD-10-CM | POA: Diagnosis not present

## 2022-12-28 ENCOUNTER — Other Ambulatory Visit: Payer: Self-pay | Admitting: Family Medicine

## 2022-12-28 DIAGNOSIS — E039 Hypothyroidism, unspecified: Secondary | ICD-10-CM

## 2022-12-31 NOTE — Telephone Encounter (Signed)
Requested Prescriptions  Pending Prescriptions Disp Refills   levothyroxine (SYNTHROID) 50 MCG tablet [Pharmacy Med Name: LEVOTHYROXINE SODIUM 50MCG TABLET] 90 tablet 2    Sig: TAKE ONE TABLET BY MOUTH DAILY     Endocrinology:  Hypothyroid Agents Failed - 12/28/2022  2:06 PM      Failed - TSH in normal range and within 360 days    TSH  Date Value Ref Range Status  09/24/2022 6.520 (H) 0.450 - 4.500 uIU/mL Final         Passed - Valid encounter within last 12 months    Recent Outpatient Visits           4 months ago Essential hypertension   [place], [name] E, MD   4 months ago Persistent dry cough   [facility] [place], [place], PA-C   1 year ago Annual physical exam   [facility] Birdie Sons, MD   2 years ago Acquired thrombophilia [facility])   [place], [name] E, MD   3 years ago Annual physical exam   [facility] Birdie Sons, MD       Future Appointments             In 1 month Ellyn Hack, Leonie Green, MD [place] [facility]   In 9 months McGowan, Shannon A, [place]

## 2023-01-24 ENCOUNTER — Encounter: Payer: Medicare PPO | Attending: Physician Assistant | Admitting: Physician Assistant

## 2023-01-24 DIAGNOSIS — Z7901 Long term (current) use of anticoagulants: Secondary | ICD-10-CM | POA: Diagnosis not present

## 2023-01-24 DIAGNOSIS — L97822 Non-pressure chronic ulcer of other part of left lower leg with fat layer exposed: Secondary | ICD-10-CM | POA: Insufficient documentation

## 2023-01-24 DIAGNOSIS — I87333 Chronic venous hypertension (idiopathic) with ulcer and inflammation of bilateral lower extremity: Secondary | ICD-10-CM | POA: Insufficient documentation

## 2023-01-24 DIAGNOSIS — I739 Peripheral vascular disease, unspecified: Secondary | ICD-10-CM | POA: Insufficient documentation

## 2023-01-24 DIAGNOSIS — I428 Other cardiomyopathies: Secondary | ICD-10-CM | POA: Insufficient documentation

## 2023-01-24 DIAGNOSIS — I872 Venous insufficiency (chronic) (peripheral): Secondary | ICD-10-CM | POA: Insufficient documentation

## 2023-01-24 DIAGNOSIS — I10 Essential (primary) hypertension: Secondary | ICD-10-CM | POA: Diagnosis not present

## 2023-01-24 DIAGNOSIS — L97812 Non-pressure chronic ulcer of other part of right lower leg with fat layer exposed: Secondary | ICD-10-CM | POA: Insufficient documentation

## 2023-01-24 DIAGNOSIS — I480 Paroxysmal atrial fibrillation: Secondary | ICD-10-CM | POA: Diagnosis not present

## 2023-01-24 DIAGNOSIS — I87332 Chronic venous hypertension (idiopathic) with ulcer and inflammation of left lower extremity: Secondary | ICD-10-CM | POA: Diagnosis not present

## 2023-01-28 DIAGNOSIS — L97812 Non-pressure chronic ulcer of other part of right lower leg with fat layer exposed: Secondary | ICD-10-CM | POA: Diagnosis not present

## 2023-01-28 DIAGNOSIS — L97822 Non-pressure chronic ulcer of other part of left lower leg with fat layer exposed: Secondary | ICD-10-CM | POA: Diagnosis not present

## 2023-01-28 DIAGNOSIS — I428 Other cardiomyopathies: Secondary | ICD-10-CM | POA: Diagnosis not present

## 2023-01-28 DIAGNOSIS — Z7901 Long term (current) use of anticoagulants: Secondary | ICD-10-CM | POA: Diagnosis not present

## 2023-01-28 DIAGNOSIS — I739 Peripheral vascular disease, unspecified: Secondary | ICD-10-CM | POA: Diagnosis not present

## 2023-01-28 DIAGNOSIS — I872 Venous insufficiency (chronic) (peripheral): Secondary | ICD-10-CM | POA: Diagnosis not present

## 2023-01-28 DIAGNOSIS — I87333 Chronic venous hypertension (idiopathic) with ulcer and inflammation of bilateral lower extremity: Secondary | ICD-10-CM | POA: Diagnosis not present

## 2023-01-28 DIAGNOSIS — I480 Paroxysmal atrial fibrillation: Secondary | ICD-10-CM | POA: Diagnosis not present

## 2023-01-28 DIAGNOSIS — I10 Essential (primary) hypertension: Secondary | ICD-10-CM | POA: Diagnosis not present

## 2023-01-28 NOTE — Progress Notes (Signed)
MIKLE, STERNBERG [name] ([id number]) [id number] Page 1 of 3 Visit Report for 01/28/2023 Arrival Information Details Patient Name: Date of Service: [name] 01/28/2023 7:45 [name] M Medical Record Number: [id number] Patient Account Number: [id number] Date of Birth/Sex: Treating RN: [birth year] (77 y.o. [name] Primary Care Finnley Lewis: [name] Other Clinician: Referring Amelie Caracci: Treating Joziah Dollins/Extender: [name] in Treatment: 0 Visit Information History Since Last Visit Added or deleted any medications: No Patient Arrived: Ambulatory Has Dressing in Place as Prescribed: Yes Arrival Time: 08:21 Has Compression in Place as Prescribed: Yes Accompanied By: self Pain Present Now: No Transfer Assistance: None Patient Identification Verified: Yes Secondary Verification Process Completed: Yes Patient Requires Transmission-Based Precautions: No Patient Has Alerts: No Electronic Signature(s) Signed: 01/28/2023 8:22:11 AM By: [name] MSN RN CNS WTA Entered By: [name] on 01/28/2023 08:22:11 -------------------------------------------------------------------------------- Clinic Level of Care Assessment Details Patient Name: Date of Service: [name] [facility] [name] 01/28/2023 7:45 [name] M Medical Record Number: [id number] Patient Account Number: [id number] Date of Birth/Sex: Treating RN: Apr 19, 1946 (77 y.o. [name] Primary Care Hollace Michelli: [name] Other Clinician: Referring Laterica Matarazzo: Treating Yuma Blucher/Extender: [name] in Treatment: 0 Clinic Level of Care Assessment Items TOOL 1 Quantity Score  - 0 Use when EandM and Procedure is performed on INITIAL visit ASSESSMENTS - Nursing Assessment / Reassessment  - 0 General Physical Exam (combine w/ comprehensive assessment (listed just below) when performed on new pt. evals)  - 0 Comprehensive Assessment (HX, ROS, Risk Assessments,  Wounds Hx, etc.) ASSESSMENTS - Wound and Skin Assessment / Reassessment  - 0 Dermatologic / Skin Assessment (not related to wound area) ASSESSMENTS - Ostomy and/or Continence Assessment and Care  - 0 Incontinence Assessment and Management  - 0 Ostomy Care Assessment and Management (repouching, etc.) PROCESS - Coordination of Care  - 0 Simple Patient / Family Education for ongoing care  - 0 Complex (extensive) Patient / Family Education for ongoing care  - 0 Staff obtains Chiropractor, Records, T Results / Process Orders est  - 0 Staff telephones HHA, Nursing Homes / Clarify orders / etc  - 0 Routine Transfer to another Facility (non-emergent condition)  - 0 Routine Hospital Admission (non-emergent condition)  - 0 New Admissions / Manufacturing engineer / Ordering NPWT Apligraf, etc. ,  - 0 Emergency Hospital Admission (emergent condition) PROCESS - Special Needs  - 0 Pediatric / Minor Patient Management [name], [name] ([id number]) [id number] Page 2 of 3  - 0 Isolation Patient Management  - 0 Hearing / Language / Visual special needs  - 0 Assessment of Community assistance (transportation, [name], etc.)  - 0 Additional assistance / Altered mentation  - 0 Support Surface(s) Assessment (bed, cushion, seat, etc.) INTERVENTIONS - Miscellaneous  - 0 External ear exam  - 0 Patient Transfer (multiple staff / Nurse, adult / Similar devices)  - 0 Simple Staple / Suture removal (25 or less)  - 0 Complex Staple / Suture removal (26 or more)  - 0 Hypo/Hyperglycemic Management (do not check if billed separately)  - 0 Ankle / Brachial Index (ABI) - do not check if billed separately Has the patient been seen at the hospital within the last three years: Yes Total Score: 0 Level Of Care: ____ Electronic Signature(s) Unsigned Entered By: [name] on 01/28/2023  08:24:04 -------------------------------------------------------------------------------- Compression Therapy Details Patient Name: Date of Service: [name] [name] 01/28/2023 7:45 [name] M Medical Record Number: [id number]  Patient Account Number: [id number] Date of Birth/Sex: Treating RN: 08/26/1946 (77 y.o. [name] Primary Care Tationna Fullard: [name] Other Clinician: Referring Emali Heyward: Treating Shaquandra Galano/Extender: [name] in Treatment: 0 Compression Therapy Performed for Wound Assessment: Wound #20 Left,Lateral Lower Leg Performed By: Clinician [name], RN Compression Type: Double Layer Electronic Signature(s) Signed: 01/28/2023 8:22:45 AM By: [name] MSN RN CNS WTA Entered By: [name] on 01/28/2023 08:22:45 -------------------------------------------------------------------------------- Encounter Discharge Information Details Patient Name: Date of Service: [name], [name] 01/28/2023 7:45 [name] M Medical Record Number: [id number] Patient Account Number: [id number] Date of Birth/Sex: Treating RN: 1946-06-27 (77 y.o. [name] Primary Care Rodriques Badie: [name] Other Clinician: Referring Elberta Lachapelle: Treating Korion Cuevas/Extender: [name] in Treatment: 0 Encounter Discharge Information Items Discharge Condition: Stable Ambulatory Status: Ambulatory Discharge Destination: Home Transportation: Private Auto Accompanied By: self Schedule Follow-up Appointment: Yes Clinical Summary of Care: Electronic Signature(s) Signed: 01/28/2023 8:23:58 AM By: [name] MSN RN CNS WTA [name],Signed: 01/28/2023 8:23:58 AM By: [name] MSN RN CNS [place] [name] ([id number]) [id number] Page 3 of 3 Entered By: [name] on 01/28/2023 08:23:58 -------------------------------------------------------------------------------- Wound Assessment Details Patient Name: Date of Service: [name] [name] 01/28/2023 7:45 [name] M Medical Record Number: [id number] Patient Account Number: [id number] Date of Birth/Sex: Treating RN: [birth year] (77 y.o. [name] Primary Care Kattia Selley: [name] Other Clinician: Referring Manuel Dall: Treating Keanthony Poole/Extender: [name] in Treatment: 0 Wound Status Wound Number: 20 Primary Etiology: Atypical Wound Location: Left, Lateral Lower Leg Wound Status: Open Wounding Event: Gradually Appeared Comorbid History: Arrhythmia, Hypertension, Gout Date Acquired: 12/24/2022 Weeks Of Treatment: 0 Clustered Wound: No Wound Measurements Length: (cm) 5 Width: (cm) 4.4 Depth: (cm) 0.1 Area: (cm) 17.279 Volume: (cm) 1.728 % Reduction in Area: 0% % Reduction in Volume: 0% Epithelialization: None Wound Description Classification: Full Thickness Without Exposed Suppo Wound Margin: Thickened Exudate Amount: Large Exudate Type: Serous Exudate Color: amber rt Structures Foul Odor After Cleansing: No Slough/Fibrino Yes Wound Bed Granulation Amount: Large (67-100%) Exposed Structure Granulation Quality: Red, Hyper-granulation Fascia Exposed: No Necrotic Amount: Small (1-33%) Fat Layer (Subcutaneous Tissue) Exposed: Yes Necrotic Quality: Adherent Slough Tendon Exposed: No Muscle Exposed: No Joint Exposed: No Bone Exposed: No Treatment Notes Wound #20 (Lower Leg) Wound Laterality: Left, Lateral Cleanser Peri-Wound Care Topical Primary Dressing Hydrofera Blue Ready Transfer Foam, 2.5x2.5 (in/in) Discharge Instruction: Apply Hydrofera Blue Ready to wound bed as directed Secondary Dressing Zetuvit Plus 4x4 (in/in) Secured With Compression Wrap Urgo K2, two layer compression system, regular Compression Stockings Add-Ons Electronic Signature(s) Signed: 01/28/2023 8:22:28 AM By: [name] MSN RN CNS WTA Entered By: [name] on 01/28/2023 08:65:78

## 2023-01-29 ENCOUNTER — Ambulatory Visit: Payer: Medicare PPO

## 2023-01-29 DIAGNOSIS — L97829 Non-pressure chronic ulcer of other part of left lower leg with unspecified severity: Secondary | ICD-10-CM | POA: Diagnosis not present

## 2023-01-29 DIAGNOSIS — L98499 Non-pressure chronic ulcer of skin of other sites with unspecified severity: Secondary | ICD-10-CM | POA: Diagnosis not present

## 2023-01-29 NOTE — Progress Notes (Signed)
[name], [name] ([id number]) [id number] Page 1 of 1 Visit Report for 01/28/2023 Physician Orders Details Patient Name: Date of Service: [name] 01/28/2023 7:45 A M Medical Record Number: [id number] Patient Account Number: [id number] Date of Birth/Sex: Treating RN: Sep 23, 1946 (77 y.o. [name] Primary Care Provider: [name] Other Clinician: Referring Provider: Treating Provider/Extender: [name] in Treatment: 0 Verbal / Phone Orders: No Diagnosis Coding Follow-up Appointments Return Appointment in 1 week. Nurse Visit as needed Edema Control - Lymphedema / Segmental Compressive Device / Other UrgoK2 Wound Treatment Wound #20 - Lower Leg Wound Laterality: Left, Lateral Prim Dressing: Hydrofera Blue Ready Transfer Foam, 2.5x2.5 (in/in) 1 x Per Week/30 Days ary Discharge Instructions: Apply Hydrofera Blue Ready to wound bed as directed Secondary Dressing: Zetuvit Plus 4x4 (in/in) 1 x Per Week/30 Days Compression Wrap: Urgo K2, two layer compression system, regular 1 x Per Week/30 Days Electronic Signature(s) Signed: 01/28/2023 8:23:34 AM By: [name] MSN RN CNS WTA Signed: 01/29/2023 11:50:37 AM By: Allen Derry PA-C Entered By: [name] on 01/28/2023 08:23:34 -------------------------------------------------------------------------------- SuperBill Details Patient Name: Date of Service: [name], [name] 01/28/2023 Medical Record Number: [id number] Patient Account Number: [id number] Date of Birth/Sex: Treating RN: 1945-10-14 (77 y.o. [name] Primary Care Provider: [name] Other Clinician: Referring Provider: Treating Provider/Extender: [name] in Treatment: 0 Diagnosis Coding ICD-10 Codes Code Description [phone] Chronic venous hypertension (idiopathic) with ulcer and inflammation of bilateral lower extremity L97.822 Non-pressure chronic ulcer of  other part of left lower leg with fat layer exposed I73.89 Other specified peripheral vascular diseases I48.0 Paroxysmal atrial fibrillation I10 Essential (primary) hypertension Facility Procedures : CPT4 Code Description: [phone] (Facility Use Only) [phone] - APPLY MULTLAY COMPRS LWR LT LEG Modifier: Quantity: 1 Electronic Signature(s) Signed: 01/28/2023 8:24:16 AM By: [name] MSN RN CNS WTA Signed: 01/29/2023 11:50:37 AM By: Allen Derry PA-C Entered By: [name] on 01/28/2023 08:24:16

## 2023-01-30 NOTE — Progress Notes (Signed)
[name] [name] ([id number]) [id number] Page 1 of 4 Visit Report for 01/24/2023 Abuse Risk Screen Details Patient Name: Date of Service: [name] 01/24/2023 1:30 PM Medical Record Number: [id number] Patient Account Number: [id number] Date of Birth/Sex: Treating [name]: November 25, 1945 (77 y.o. [name] Primary Care [name]: [name] Other Clinician: Referring [name]: Treating [name]/Extender: [name] in Treatment: 0 Abuse Risk Screen Items Answer ABUSE RISK SCREEN: Has anyone close to you tried to hurt or harm you recentlyo No Do you feel uncomfortable with anyone in your familyo No Has anyone forced you do things that you didnt want to doo No Electronic Signature(s) Signed: 01/29/2023 5:03:01 PM By: [name], BSN, [name], CWS, [name], BSN Entered By: [name], BSN, [name], CWS, [name] on 01/24/2023 13:57:49 -------------------------------------------------------------------------------- Activities of Daily Living Details Patient Name: Date of Service: [name], [name] 01/24/2023 1:30 PM Medical Record Number: [id number] Patient Account Number: [id number] Date of Birth/Sex: Treating [name]: 10-17-1945 (77 y.o. [name] Primary Care [name]: [name] Other Clinician: Referring [name]: Treating [name]/Extender: [name] in Treatment: 0 Activities of Daily Living Items Answer Activities of Daily Living (Please select one for each item) Drive Automobile Completely Able T Medications ake Completely Able Use T elephone Completely Able Care for Appearance Completely Able Use T oilet Completely Able Bath / Shower Completely Able Dress Self Completely Able Feed Self Completely Able Walk Completely Able Get In / Out Bed Completely Able Housework Completely Able Prepare Meals Completely Able Handle Money Completely Able Shop for Self Completely Able Electronic Signature(s) Signed:  01/29/2023 5:03:01 PM By: [name], BSN, [name], CWS, [name], BSN Entered By: [name], BSN, [name], CWS, [name] on 01/24/2023 13:58:00 -------------------------------------------------------------------------------- Education Screening Details Patient Name: Date of Service: [name], [name] 01/24/2023 1:30 PM Medical Record Number: [id number] Patient Account Number: [id number] Date of Birth/Sex: Treating [name]: Jun 10, 1946 (77 y.o. [name] Primary Care [name]: [name] Other Clinician: Referring [name]: Treating [name]/Extender: [name] in Treatment: [address] [name], [name] ([id number]) [id number] Page 2 of 4 Primary Learner Assessed: Patient Learning Preferences/Education Level/Primary Language Learning Preference: Demonstration Highest Education Level: College or Above Preferred Language: Economist Language Barrier: No Translator Needed: No Memory Deficit: No Emotional Barrier: No Cultural/Religious Beliefs Affecting Medical Care: No Physical Barrier Impaired Vision: No Impaired Hearing: No Decreased Hand dexterity: No Knowledge/Comprehension Knowledge Level: High Comprehension Level: High Ability to understand written instructions: High Ability to understand verbal instructions: High Motivation Anxiety Level: Calm Cooperation: Cooperative Education Importance: Acknowledges Need Interest in Health Problems: Asks Questions Perception: Coherent Willingness to Engage in Self-Management High Activities: Readiness to Engage in Self-Management High Activities: Psychologist, prison and probation services) Signed: 01/29/2023 5:03:01 PM By: [name], BSN, [name], CWS, [name], BSN Entered By: [name], BSN, [name], CWS, [name] on 01/24/2023 13:58:29 -------------------------------------------------------------------------------- Fall Risk Assessment Details Patient Name: Date of Service: [name], [name] 01/24/2023 1:30 PM Medical Record  Number: [id number] Patient Account Number: [id number] Date of Birth/Sex: Treating [name]: 10-19-1945 (77 y.o. [name], [name] Primary Care [name]: [name] Other Clinician: Referring [name]: Treating [name]/Extender: [name] in Treatment: 0 Fall Risk Assessment Items Have you had 2 or more falls in the last 12 monthso 0 No Have you had any fall that resulted in injury in the last 12 monthso 0 No FALLS RISK SCREEN History of falling - immediate or within 3 months 25 Yes Secondary  diagnosis (Do you have 2 or more medical diagnoseso) 0 No Ambulatory aid None/bed rest/wheelchair/nurse 0 Yes Crutches/cane/walker 0 No Furniture 0 No Intravenous therapy Access/Saline/Heparin Lock 0 No Gait/Transferring Normal/ bed rest/ wheelchair 0 Yes Weak (short steps with or without shuffle, stooped but able to lift head while walking, may seek 0 No support from furniture) Impaired (short steps with shuffle, may have difficulty arising from chair, head down, impaired 0 No balance) Mental Status Oriented to own ability 0 Yes Overestimates or forgets limitations 0 No Risk Level: Medium Risk Score: [name] [id number] Page 3 of 4 Electronic Signature(s) -------------------------------------------------------------------------------- Foot Assessment Details Patient Name: Date of Service: [name] 01/24/2023 1:30 PM Medical Record Number: [id number] Patient Account Number: [id number] Date of Birth/Sex: Treating [name]: 1946-04-27 (77 y.o. [name] Primary Care [name]: [name] Other Clinician: Referring [name]: Treating [name]/Extender: [name] in Treatment: 0 Foot Assessment Items Site Locations + = Sensation present, - = Sensation absent, C = Callus, U = Ulcer [name] = Redness, W = Warmth, M = Maceration, PU = Pre-ulcerative lesion F = Fissure, S = Swelling, D =  Dryness Assessment Right: Left: Other Deformity: No No Prior Foot Ulcer: No No Prior Amputation: No No Charcot Joint: No No Ambulatory Status: Ambulatory Without Help Gait: Steady Electronic Signature(s) Signed: 01/29/2023 5:03:01 PM By: [name], BSN, [name], CWS, [name], BSN Entered By: [name], BSN, [name], CWS, [name] on 01/24/2023 13:59:14 -------------------------------------------------------------------------------- Nutrition Risk Screening Details Patient Name: Date of Service: [name], [name] 01/24/2023 1:30 PM Medical Record Number: [id number] Patient Account Number: [id number] Date of Birth/Sex: Treating [name]: 11/06/1945 (77 y.o. [name] Primary Care Ziaire Hagos: [name] Other Clinician: Referring Jadavion Spoelstra: Treating Dehaven Sine/Extender: [name] in Treatment: 0 Height (in): 75 Weight (lbs): 220 Body Mass Index (BMI): 27.5 [name], [name] [id number] Page 4 of 4 Nutrition Risk Screening Items Score Screening NUTRITION RISK SCREEN: I have an illness or condition that made me change the kind and/or amount of food I eat 0 No I eat fewer than two meals per day 0 No I eat few fruits and vegetables, or milk products 0 No I have three or more drinks of beer, liquor or wine almost every day 0 No I have tooth or mouth problems that make it hard for me to eat 0 No I don't always have enough money to buy the food I need 0 No I eat alone most of the time 0 No I take three or more different prescribed or over-the-counter drugs [name] day 1 Yes Without wanting to, I have lost or gained 10 pounds in the last six months 0 No I am not always physically able to shop, cook and/or feed myself 0 No Nutrition Protocols Good Risk Protocol Moderate Risk Protocol 0 Provide education on nutrition High Risk Proctocol Risk Level: Good Risk Score: 1 Electronic Signature(s) Signed: 01/29/2023 5:03:01 PM By: [name], BSN, [name], CWS, Kim  [name], BSN Entered By: [name], BSN, [name], CWS, [name] on 01/24/2023 13:59:05

## 2023-01-30 NOTE — Progress Notes (Signed)
[name], [name] R ([id number]) [id number] Page 1 of 7 Visit Report for 01/24/2023 Allergy List Details Patient Name: Date of Service: [name] [name] 01/24/2023 1:30 PM Medical Record Number: [id number] Patient Account Number: [id number] Date of Birth/Sex: Treating RN: [birth year] (77 y.o. Arthur Holms Primary Care Kailie Polus: [name] Other Clinician: Referring Tamesha Ellerbrock: Treating Chandy Tarman/Extender: [name] in Treatment: 0 Allergies Active Allergies No Known Allergies Allergy Notes Electronic Signature(s) Signed: 01/29/2023 5:03:01 PM By: Elliot Gurney, BSN, RN, CWS, Kim RN, BSN Entered By: Elliot Gurney, BSN, RN, CWS, Kim on 01/24/2023 13:57:33 -------------------------------------------------------------------------------- Arrival Information Details Patient Name: Date of Service: [name], [name] LBERT R. 01/24/2023 1:30 PM Medical Record Number: [id number] Patient Account Number: [id number] Date of Birth/Sex: Treating RN: [birth year] (77 y.o. Arthur Holms Primary Care Shakti Fleer: [name] Other Clinician: Referring Keymani Mclean: Treating Gwyneth Fernandez/Extender: [name] in Treatment: 0 Visit Information Patient Arrived: Ambulatory Arrival Time: 13:47 Accompanied By: self Transfer Assistance: None Patient Identification Verified: Yes Secondary Verification Process Completed: Yes History Since Last Visit Added or deleted any medications: No Has Dressing in Place as Prescribed: No Pain Present Now: No Electronic Signature(s) Signed: 01/29/2023 5:03:01 PM By: Elliot Gurney, BSN, RN, CWS, Kim RN, BSN Entered By: Elliot Gurney, BSN, RN, CWS, Kim on 01/24/2023 13:48:32 -------------------------------------------------------------------------------- Clinic Level of Care Assessment Details Patient Name: Date of Service: [name], [name] LBERT R. 01/24/2023 1:30 PM Medical Record Number: [id number] Patient Account Number: [id number] Date of  Birth/Sex: Treating RN: 1946-06-13 (77 y.o. Arthur Holms Primary Care Ayriana Wix: [name] Other Clinician: Referring Yordy Matton: Treating Karrington Mccravy/Extender: [name] in Treatment: 0 Clinic Level of Care Assessment Items TOOL 1 Quantity Score []  - 0 Use when EandM and Procedure is performed on INITIAL visit ASSESSMENTS - Nursing Assessment / Reassessment X- 1 20 General Physical Exam (combine w/ comprehensive assessment (listed just below) when performed on new pt. evals) X- 1 25 Comprehensive Assessment (HX, ROS, Risk Assessments, Wounds Hx, etc.) ASSESSMENTS - Wound and Skin Assessment / Reassessment []  - 0 Dermatologic / Skin Assessment (not related to wound area) [name], [name] R ([id number]) [phone].pdf Page 2 of 7 ASSESSMENTS - Ostomy and/or Continence Assessment and Care []  - 0 Incontinence Assessment and Management []  - 0 Ostomy Care Assessment and Management (repouching, etc.) PROCESS - Coordination of Care X - Simple Patient / Family Education for ongoing care 1 15 []  - 0 Complex (extensive) Patient / Family Education for ongoing care X- 1 10 Staff obtains Chiropractor, Records, T Results / Process Orders est []  - 0 Staff telephones HHA, Nursing Homes / Clarify orders / etc []  - 0 Routine Transfer to another Facility (non-emergent condition) []  - 0 Routine Hospital Admission (non-emergent condition) X- 1 15 New Admissions / Manufacturing engineer / Ordering NPWT Apligraf, etc. , []  - 0 Emergency Hospital Admission (emergent condition) PROCESS - Special Needs []  - 0 Pediatric / Minor Patient Management []  - 0 Isolation Patient Management []  - 0 Hearing / Language / Visual special needs []  - 0 Assessment of Community assistance (transportation, [name], etc.) []  - 0 Additional assistance / Altered mentation []  - 0 Support Surface(s) Assessment (bed, cushion, seat, etc.) INTERVENTIONS -  Miscellaneous []  - 0 External ear exam []  - 0 Patient Transfer (multiple staff / Nurse, adult / Similar devices) []  - 0 Simple Staple / Suture removal (25 or less) []  - 0 Complex Staple / Suture removal (26 or more) []  - 0 Hypo/Hyperglycemic Management (  do not check if billed separately)  - 0 Ankle / Brachial Index (ABI) - do not check if billed separately Has the patient been seen at the hospital within the last three years: Yes Total Score: 85 Level Of Care: New/Established - Level 3 Electronic Signature(s) Signed: 01/29/2023 5:03:01 PM By: Elliot Gurney, BSN, RN, CWS, Kim RN, BSN Entered By: Elliot Gurney, BSN, RN, CWS, Kim on 01/24/2023 14:53:53 -------------------------------------------------------------------------------- Lower Extremity Assessment Details Patient Name: Date of Service: [name], [name] LBERT R. 01/24/2023 1:30 PM Medical Record Number: [id number] Patient Account Number: [id number] Date of Birth/Sex: Treating RN: December 24, 1945 (77 y.o. Arthur Holms Primary Care [name] [name]: [name] Other Clinician: Referring Rubens Cranston: Treating Celicia Minahan/Extender: [name] in Treatment: 0 Edema Assessment Assessed: [name]: Yes] Franne Forts: No] Edema: [Left: Ye] [Right: s] Calf Left: Right: Point of Measurement: 39 cm From Medial Instep 41 cm Ankle [name], [name] R ([id number]) [id number] Page 3 of 7 Left: Right: Point of Measurement: 11 cm From Medial Instep 27 cm Vascular Assessment Pulses: Dorsalis Pedis Palpable: [Left:Yes] Electronic Signature(s) Signed: 01/29/2023 5:03:01 PM By: Elliot Gurney, BSN, RN, CWS, Kim RN, BSN Entered By: Elliot Gurney, BSN, RN, CWS, Kim on 01/24/2023 13:57:14 -------------------------------------------------------------------------------- Multi Wound Chart Details Patient Name: Date of Service: [name], [name] LBERT R. 01/24/2023 1:30 PM Medical Record Number: [id number] Patient Account Number: [id number] Date of Birth/Sex:  Treating RN: 1946/01/20 (77 y.o. Arthur Holms Primary Care Kiani Wurtzel: [name] Other Clinician: Referring Raena Pau: Treating Rashidi Loh/Extender: [name] in Treatment: 0 Vital Signs Height(in): 75 Pulse(bpm): 47 Weight(lbs): 220 Blood Pressure(mmHg): 155/73 Body Mass Index(BMI): 27.5 Temperature(F): 98.1 Respiratory Rate(breaths/min): 18 [20:Photos:] [[name]:[name]] Left, Lateral Lower Leg [name] Wound Location: Gradually Appeared [name] Wounding Event: Venous Leg Ulcer [name] Primary Etiology: 12/24/2022 [name] Date Acquired: 0 [name] Weeks of Treatment: Open [name] Wound Status: No [name] Wound Recurrence: 5x4.4x0.1 [name] Measurements L x W x D (cm) 17.279 [name] [name] (cm) : rea 1.728 [name] Volume (cm) : Full Thickness Without Exposed [name] Classification: Support Structures Large [name] Exudate Amount: Serous [name] Exudate Type: amber [name] Exudate Color: Thickened [name] Wound Margin: Large (67-100%) [name] Granulation Amount: Red, Hyper-granulation [name] Granulation Quality: Small (1-33%) [name] Necrotic Amount: Fat Layer (Subcutaneous Tissue): Yes [name] Exposed Structures: Fascia: No Tendon: No Muscle: No Joint: No Bone: No None [name] Epithelialization: Treatment Notes Electronic Signature(s) Signed: 01/29/2023 5:03:01 PM By: Elliot Gurney, BSN, RN, CWS, Kim RN, BSN [address] [place] R ([id number]) [id number].pdf Page 4 of 7 Entered By: Elliot Gurney, BSN, RN, CWS, Kim on 01/24/2023 14:51:51 -------------------------------------------------------------------------------- Multi-Disciplinary Care Plan Details Patient Name: Date of Service: [name] [name] 01/24/2023 1:30 PM Medical Record Number: [id number] Patient Account Number: [id number] Date of Birth/Sex: Treating RN: 03-Oct-1946 (77 y.o. Arthur Holms Primary Care Mills Mitton: [name] Other Clinician: Referring  Samadhi Mahurin: Treating Hamlin Devine/Extender: [name] in Treatment: 0 Active Inactive Malignancy/Atypical Etiology Nursing Diagnoses: Knowledge deficit related to disease process and management of atypical ulcer etiology Knowledge deficit related to disease process and management of malignancy Goals: Patient/caregiver will verbalize understanding of disease process and disease management of atypical ulcer etiology Date Initiated: 01/24/2023 Target Resolution Date: 02/22/2023 Goal Status: Active Patient/caregiver will verbalize understanding of disease process and disease management of malignancy Date Initiated: 01/24/2023 Target Resolution Date: 02/22/2023 Goal Status: Active Interventions: Assess patient and family medical history for signs and symptoms of malignancy/atypical  etiology upon admission Provide education on atypical ulcer etiologies Provide education on malignant ulcerations Notes: Wound/Skin Impairment Nursing Diagnoses: Impaired tissue integrity Knowledge deficit related to smoking impact on wound healing Knowledge deficit related to ulceration/compromised skin integrity Goals: Ulcer/skin breakdown will have [name] volume reduction of 30% by week 4 Date Initiated: 01/24/2023 Target Resolution Date: 02/14/2023 Goal Status: Active Ulcer/skin breakdown will have [name] volume reduction of 50% by week 8 Date Initiated: 01/24/2023 Target Resolution Date: 03/14/2023 Goal Status: Active Ulcer/skin breakdown will have [name] volume reduction of 80% by week 12 Date Initiated: 01/24/2023 Target Resolution Date: 04/11/2023 Goal Status: Active Ulcer/skin breakdown will heal within 14 weeks Date Initiated: 01/24/2023 Target Resolution Date: 04/25/2023 Goal Status: Active Interventions: Assess patient/caregiver ability to obtain necessary supplies Assess patient/caregiver ability to perform ulcer/skin care regimen upon admission and as needed Assess ulceration(s) every  visit Provide education on ulcer and skin care Treatment Activities: Referred to DME Donta Fuster for dressing supplies : 01/24/2023 Skin care regimen initiated : 01/24/2023 Topical wound management initiated : 01/24/2023 Notes: Electronic Signature(s) Signed: 01/29/2023 5:03:01 PM By: Elliot Gurney, BSN, RN, CWS, Kim RN, BSN [address] [place] R ([id number]) [phone].pdf Page 5 of 7 Entered By: Elliot Gurney, BSN, RN, CWS, Kim on 01/24/2023 14:56:10 -------------------------------------------------------------------------------- Pain Assessment Details Patient Name: Date of Service: [name] [name] 01/24/2023 1:30 PM Medical Record Number: [id number] Patient Account Number: [id number] Date of Birth/Sex: Treating RN: 1946-07-13 (77 y.o. Arthur Holms Primary Care Tanna Loeffler: [name] Other Clinician: Referring Lankford Gutzmer: Treating Victoriah Wilds/Extender: [name] in Treatment: 0 Active Problems Location of Pain Severity and Description of Pain Patient Has Paino No Site Locations Pain Management and Medication Current Pain Management: Notes Patient denies pain at this time. Electronic Signature(s) Signed: 01/29/2023 5:03:01 PM By: Elliot Gurney, BSN, RN, CWS, Kim RN, BSN Entered By: Elliot Gurney, BSN, RN, CWS, Kim on 01/24/2023 13:48:49 -------------------------------------------------------------------------------- Patient/Caregiver Education Details Patient Name: Date of Service: [name] RGA [name] 4/18/2024andnbsp1:30 PM Medical Record Number: [id number] Patient Account Number: [id number] Date of Birth/Gender: Treating RN: 11-18-1945 (77 y.o. Arthur Holms Primary Care Physician: [name] Other Clinician: Referring Physician: Treating Physician/Extender: [name] in Treatment: 0 Education Assessment Education Provided To: Patient Education Topics Provided Wound/Skin Impairment: Handouts: Caring for Your Ulcer Methods:  Explain/Verbal Responses: State content correctly [name], [name] ([id number]) [id number] Page 6 of 7 Electronic Signature(s) Signed: 01/29/2023 5:03:01 PM By: Elliot Gurney, BSN, RN, CWS, Kim RN, BSN Entered By: Elliot Gurney, BSN, RN, CWS, Kim on 01/24/2023 14:56:22 -------------------------------------------------------------------------------- Wound Assessment Details Patient Name: Date of Service: [name], [name] LBERT R. 01/24/2023 1:30 PM Medical Record Number: [id number] Patient Account Number: [id number] Date of Birth/Sex: Treating RN: July 09, 1946 (77 y.o. [name], [name] Primary Care Colon Rueth: [name] Other Clinician: Referring Marshawn Ninneman: Treating Ethan Kasperski/Extender: [name] in Treatment: 0 Wound Status Wound Number: [id number] Primary Etiology: Venous Leg Ulcer Wound Location: Left, Lateral Lower Leg Wound Status: Open Wounding Event: Gradually Appeared Date Acquired: 12/24/2022 Weeks Of Treatment: 0 Clustered Wound: No Photos Wound Measurements Length: (cm) 5 Width: (cm) 4.4 Depth: (cm) 0.1 Area: (cm) 17.279 Volume: (cm) 1.728 % Reduction in Area: % Reduction in Volume: Epithelialization: None Tunneling: No Undermining: No Wound Description Classification: Full Thickness Without Exposed Suppor Wound Margin: Thickened Exudate Amount: Large Exudate Type: Serous Exudate Color: amber t Structures Foul Odor After Cleansing: No Slough/Fibrino Yes Wound Bed Granulation Amount: Large (67-100%) Exposed Structure Granulation Quality: Red, Hyper-granulation Fascia Exposed: No Necrotic Amount:  Small (1-33%) Fat Layer (Subcutaneous Tissue) Exposed: Yes Necrotic Quality: Adherent Slough Tendon Exposed: No Muscle Exposed: No Joint Exposed: No Bone Exposed: No Electronic Signature(s) Signed: 01/29/2023 5:03:01 PM By: Elliot Gurney, BSN, RN, CWS, Kim RN, BSN Entered By: Elliot Gurney, BSN, RN, CWS, Kim on 01/24/2023  13:56:16 -------------------------------------------------------------------------------- Vitals Details Patient Name: Date of Service: [name], [name] LBERT R. 01/24/2023 1:30 PM Medical Record Number: [id number] Patient Account Number: [id number] [name], [name] ([id number]) [id number] Page 7 of 7 Date of Birth/Sex: Treating RN: 1946/03/22 (77 y.o. Arthur Holms Primary Care Donathan Buller: Other Clinician: [name] Referring Langley Flatley: Treating Adaiah Morken/Extender: [name] in Treatment: 0 Vital Signs Time Taken: 14:48 Temperature (F): 98.1 Height (in): 75 Pulse (bpm): 47 Weight (lbs): 220 Respiratory Rate (breaths/min): 18 Body Mass Index (BMI): 27.5 Blood Pressure (mmHg): 155/73 Reference Range: 80 - 120 mg / dl Electronic Signature(s) Signed: 01/29/2023 5:03:01 PM By: Elliot Gurney, BSN, RN, CWS, Kim RN, BSN Entered By: Elliot Gurney, BSN, RN, CWS, Kim on 01/24/2023 13:49:55

## 2023-01-30 NOTE — Progress Notes (Signed)
YAKUB, LODES [name] ([id number]) [id number] Page 1 of 10 Visit Report for 01/24/2023 Chief Complaint Document Details Patient Name: Date of Service: [name] [name] 01/24/2023 1:30 PM Medical Record Number: [id number] Patient Account Number: [id number] Date of Birth/Sex: Treating RN: June 09, 1946 (77 y.o. Arthur Holms Primary Care Provider: [name] Other Clinician: Referring Provider: Treating Provider/Extender: [name] in Treatment: 0 Information Obtained from: Patient Chief Complaint Left LE Ulcer Electronic Signature(s) Signed: 01/24/2023 2:03:03 PM By: Allen Derry PA-C Entered By: Allen Derry on 01/24/2023 14:03:02 -------------------------------------------------------------------------------- HPI Details Patient Name: Date of Service: [name], [name] 01/24/2023 1:30 PM Medical Record Number: [id number] Patient Account Number: [id number] Date of Birth/Sex: Treating RN: Mar 19, 1946 (77 y.o. Loel Lofty, Selena Batten Primary Care Provider: [name] Other Clinician: Referring Provider: Treating Provider/Extender: [name] in Treatment: 0 History of Present Illness HPI Description: 77 year old male who has [name] past medical history of essential hypertension, chronic atrial fibrillation, peripheral vascular disease, nonischemic cardiomyopathy,venous stasis dermatitis, gouty arthropathy, basal cell carcinoma of the right lower extremity, benign prostatic hypertrophy, long- term use of anticoagulation therapy, hyperglycemia and exercise intolerance has never been [name] smoker. the patient has had [name] vascular workup over 7 years ago and said everything was normal at that stage. He does not have any chronic problems except for cardiac issues which he sees [name] cardiologist in [place]. 08/15/2017 -- arterial and venous duplex studies still pending. 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 No evidence of SVT or DVT in bilateral lower extremities The patient has an appointment at the end of the month to get his arterial duplex study done 09/05/2017 -- the patient was seen at the vein and vascular office yesterday by Bary Castilla. ABI studies were notable for medial calcification and the toe brachial indices were normal and bilateral ankle-brachial) waveforms were normal with triphasic flow. After review of his venous studies he was not [name] candidate for laser ablation and his lymphedema was to be treated with compression stockings and lymphedema pump pumps 09/12/2017 -- had [name] low arterial study done at the [facility] -- unable to obtain reliable ABI is due to medial calcification. Bilateral toe indices were normal with the right being 1.01 and the left being 0.92 and the waveforms were triphasic bilaterally. he did get hold of 30-40 mm compression stockings but is unable to put these on. We will try and get him alternative compression stockings. 09/26/17- he is here in follow up evaluation of [name] right lower extremity ulcer;he is compliant in wearing compression stocking; ulcer almost epithelialized , anticipate healing next appointment Readmission: 11/17 point upon evaluation patient's wound currently that he is seeing [place] for today is [name] skin cancerous lesion that was cleared away by his dermatologist on the left medial calf region. He tells me that this is [name] very similar thing to what he had done previously in fact the last time he saw him in 2018 this was also what was going on at that point. Nonetheless he feels that based on what he seeing currently that this is just having [name] lot of harder time healing although it is much closer to the surface than what he is experienced in the past. He notes  that the initial removal was in June 2022 which  was this year this is now November and still has not closed. He does have some edema and definitely I think that there is some venous component to his slow healing here. Also think that we can do something better than Vaseline to try to help with getting this to clear up as quickly as possible. He does have [name] history of atrial fibrillation and is on Eliquis otherwise he really has no major medical problems that would affect wound healing. 09/07/2021 upon evaluation today patient actually appears to be doing significantly better after having wrapped him last week. Overall I think that this is making significant improvements at this time which is great news. I do not see any evidence of infection which is great news as well. No fevers, chills, nausea, vomiting, or diarrhea. 09/14/2021 upon evaluation today patient appears to be doing well with regard to his leg ulcer. He has been tolerating the dressing changes and overall I think that he is making excellent progress. I do not see any signs of active infection at this time. 09/21/2021 upon evaluation today patient actually appears to be making good progress with regard to his wound this is again measuring smaller today no debridement seems to be necessary. We have been using [name] silver collagen dressing and I think that is doing an awesome job. [name], [name] ([id number]) [id number] Page 2 of 10 09/28/2021 upon evaluation today patient appears to be doing well with regard to his leg currently. I do not see any signs of active infection at this time which is great news. No fevers, chills, nausea, vomiting, or diarrhea. I think this wound is very close to complete resolution. 10/12/2021 upon evaluation today patient actually appears to be doing awesome in regard to his leg ulcer. In fact this appears to be completely healed based on what I am seeing currently. I do not see any  evidence of active infection locally nor systemically at this time which is also great news. No fevers, chills, nausea, vomiting, or diarrhea. Readmission: 12/07/2021 upon evaluation today patient presents for readmission here in the clinic. He was discharged on 10/12/2021 is completely healed. Unfortunately this has reopened at this point and he is having continual issues with new blisters over both lower extremities. This is even worse than what we previously saw. Nonetheless we did actually check his ABIs today and it did reveal that his ABIs were 0.55 on the left and 0.57 on the right. Subsequently this is [name] definite change from his last arterial study which showed that he did have good blood flow at 1.01 on the right and 0.92 on the left and that was right at the beginning of 2019. Nonetheless based on what we see currently I do think he tolerated the 3 layer compression wrap but I do believe that we probably need to get him tested for his arterial flow in order to see where things stand and if there is something we can do there that would help prevent this from continue to be an ongoing issue. He did not utilize compression socks in the interim from when he was last here till this time. That something is probably going to need lifelong going forward as well. 3/9; patient presents for follow-up. He has no issues or complaints today. He tolerated the compression wrap well. He had ABIs with TBI's done. He denies signs of infection. 12/21/2021 upon evaluation today patient appears to be doing well with regard to the wounds on his legs. Both are showing  signs of significant improvement which is great news although I do believe some sharp debridement would be of benefit here as well. 12/28/2021 upon evaluation today patient appears to be doing well with regard to his wounds. Everything is showing signs of excellent improvement which I am very pleased about. I think that we are headed in the right direction  here. Fortunately there does not appear to be any evidence of infection which is great news there is [name] little bit of hypergranulation. 01/04/2022 upon evaluation today patient appears to be doing well with regard to his wounds 2 of them are healed 1 is almost so and the other 1 is significantly better. Overall I am extremely pleased with where we stand and I think that he is making excellent progress here. I do not see any evidence of active infection locally nor systemically at this time. 01-16-2022 upon evaluation today patient's wound on the left leg is showing signs of doing quite well. Has not completely cleared at this point but it is much improved. Fortunately I do not see any signs of infection at this time. No fevers, chills, nausea, vomiting, or diarrhea. 01-23-2022 upon evaluation today patient's wound of the left leg actually appears to be pretty much completely healed which is great news. I do not see any signs of active infection locally or systemically which is excellent. With that being said on the right leg what wound is measuring smaller the other 1 is [name] new wound that just showed up fortunately its not too bad. Has been using Xeroform here and that seems to be doing decently well which is great news. Unfortunately his blood pressure is significantly high we gave him the readings for the past 4-5 visits as well as [name] recommendation to make an appointment to go discuss this with his primary care provider patient states that he is going to look into doing this. 01-30-2022 upon evaluation today patient appears to be doing well with regard to his left leg everything appears to be healed. On the right leg the more anterior wound is healed the more medial wound that I been concerned about [name] possible skin cancer unfortunately still does not look great to me. I do believe that we should probably do [name] biopsy I have talked about it with him [name] few times I think though it is probably time to go ahead  and do this at this point. 02-09-2022 upon evaluation today patient appears to be doing well with regard to his legs. On the left this appears to be completely healed. On the right he does have 2 areas and be perfectly honest one of them is [name] skin cancer that he is going to the Mohs surgery clinic for the other seems to be healing nicely. Readmission: 08-02-2022 upon evaluation today patient appears for reevaluation here in our clinic concerning issues that he has been having with wounds over the bilateral lower extremities. I last saw him in May 2023 and at that point we had him completely healed. Unfortunately he is tells me this has broken down to some degree since that point. 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 He tells me this 1 seems to just be getting bigger and bigger not improving. Fortunately he is not having any significant  pain which is good news he does have quite [name] bit of swelling and he tells me that his fluid pills are not recommended for him to take daily but just in 3-day intervals here and there. 08-09-2022 upon evaluation today patient appears to be doing still somewhat poorly in regard to his legs although in general he does not appear to be feeling as good as he has been. Fortunately there does not appear to be any signs of infection which is good news. With that being said he is having some issues here with having and overall poor feeling in general which again is good I think going to be the biggest complicating factor. He actually seems to be coughing I do not hear any wheezing right now I did listen to his chest he did not have good airflow down low however makes me suspicious for bronchitis or even possibly pneumonia which could be part of what is going on here as well.  Fortunately I do not see any evidence of anything worsening in regard to his legs but I definitely believe that he needs to continue with the compression wraps he took them off yesterday to shower has not had anything on for 24 hours that is why his legs are so swollen today. With regard to his pathology report I did review that it showed some squamous abnormality but no signs of distinct carcinoma. With that being said it was saying that it could be adjacent to [name] squamous cell carcinoma nonetheless my suggestion is can be that we have the patient take copy of this report and give it to his Mohs surgeon in order for them to see if there is anything they feel like needs to be done further. With that being said right now I feel like the primary thing is going to be for [place] to try to get his swelling down and keep that down into that hand since he is having so much drainage I believe we can have to bring him in for dressing changes twice [name] week doing [name] nurse visit on Mondays. 11/9; since the patient was last here he spent the night in the emergency room he received IV Lasix. Also received antibiotics although he was not discharged on either 1 of these. He also saw his cardiology office who put him on regular Lasix 20 mg [previously on as needed Lasix 20 mg]. Per our intake nurse the swelling in his legs is remarkably better but he still has bilateral lower extremity wounds. He still has wounds on the bilateral lower extremities most problematically on the left lateral calf. He has been using silver alginate under 3 layer compression. 08-23-2022 upon evaluation today patient appears to be doing much better than the last time I saw him 2 weeks ago. At that point I was very concerned about how he was doing he did see Dr. Sherrie Mustache his primary care provider they got him on some blood pressure medication in general his color and overall appearance looks to be doing much improved compared to the last time I saw  him. 09-04-2022 upon evaluation today patient appears to be doing well currently in regard to his wounds. Everything is showing signs of improvement which is great news. Fortunately there does not appear to be any signs of active infection locally or systemically at this time. No fevers, chills, nausea, vomiting, or diarrhea. 09-10-2022 upon evaluation today patient appears to be doing better in regard to his wounds although the [facility] was  extremely stuck to the wound bed. Fortunately there does not appear to be any signs of infection locally or systemically at this time which is great news. No fevers, chills, nausea, vomiting, or diarrhea. 09-17-2022 upon evaluation today patient appears to be doing well currently in regard to his wounds in general. The right leg actually showing signs of excellent improvement and very pleased with where things stand in that regard. Fortunately I do not see any evidence of infection locally or systemically at this time [name], [name] ([id number]) [id number] Page 3 of 10 which is great news. No fevers, chills, nausea, vomiting, or diarrhea. 09-24-2022 upon evaluation today patient appears to be doing well currently in regard to his wounds. Things look to be doing quite well. 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 He is seeing his dermatologist tomorrow in that regard. With that being said other than that however he seems to really be making some pretty good progress here which is good news. No fevers, chills, nausea, vomiting, or diarrhea. 12/26; the patient has 2 open wounds remaining on the left leg. One is on the left anterior mid tibia and the other is on the right lateral knee just outside of the popliteal fossa. 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  The patient has been to see dermatology Dr. Adolphus Birchwood who apparently is making him [name] referral to the [facility] Mohs surgery center. He does not yet have an appointment 10-12-2022 upon evaluation today patient appears to be doing well currently in regard to his wound. He has been tolerating the dressing changes without complication and overall feel like we are headed in the right direction. Fortunately I do not see any signs of infection locally or systemically at this time which is great news. No fevers, chills, nausea, vomiting, or diarrhea. 10-23-2022 upon evaluation today patient appears to be doing well currently in regard to his wound. He has been tolerating the dressing changes without complication. Fortunately there does not appear to be any signs of active infection locally nor systemically which is great news and overall I am extremely pleased with where we stand currently. No fevers, chills, nausea, vomiting, or diarrhea. 10-26-2022 upon evaluation today patient appears to be doing well currently in regard to his wounds. Everything is showing signs of improvement and this is great news. Fortunately I see no evidence of active infection systemically. He does seem to be doing much better in regard to the local infection in regard to his leg. The smell is also greatly improved. Overall I am extremely happy with where we stand today. This is after just [name] few days with the antibiotic on board. 11-05-2022 upon evaluation today patient appears to be doing well currently in regard to his wounds although the wound where they performed the Mohs surgery does look [name] little bit hyper granulated I think switching to [facility] may be better for him. He voiced understanding. Fortunately there does not appear to be any evidence of active infection locally nor systemically at this time. 11-12-2022 upon evaluation today patient appears to be doing better in regard to both wounds he has been tolerating the dressing  changes without complication. There is no signs of infection and in general I think you are doing quite well. No fevers, chills, nausea, vomiting, or diarrhea. 11-20-2022 upon evaluation today  patient appears to be doing well currently in regard to his wounds. He has been tolerating the dressing changes without complication. Fortunately there does not appear to be any signs of infection at this time. No fevers, chills, nausea, vomiting, or diarrhea. 11-27-2022 upon evaluation today patient appears to be doing somewhat poorly in regard to his leg in general he has [name] lot of areas where he looks like he had some spots that popped up. There with regard to new possible blisters. In general I am actually very concerned about the fact that the wrap may be causing some irritation here. I think that we can try to not do the wrap for 1 week, and given the prescription for mupirocin ointment which I will send into the pharmacy for him. 12-04-2022 upon evaluation today patient appears to be doing well currently in regard to his wounds in fact he appears to be pretty much completely healed based on what I am seeing at this point. I do not see any signs of active infection locally nor systemically at this time which is great news. No fevers, chills, nausea, vomiting, or diarrhea. 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 I discussed that with him today he is aware of the situation.-2024 upon evaluation patient presents for readmission here in the clinic actually last saw him December 04, 2022. With that being said previously his dermatologist had asked that if there was something the need to be addressed from [name] dermatology standpoint and specifically biopsy that we make referral back to them to allow them to do it which I am definitely happy to do. Electronic  Signature(s) Signed: 01/25/2023 1:20:01 PM By: Allen Derry PA-C Entered By: Allen Derry on 01/25/2023 13:20:01 -------------------------------------------------------------------------------- Physical Exam Details Patient Name: Date of Service: [name], [name] 01/24/2023 1:30 PM Medical Record Number: [id number] Patient Account Number: [id number] Date of Birth/Sex: Treating RN: June 24, 1946 (77 y.o. Arthur Holms Primary Care Provider: [name] Other Clinician: Referring Provider: Treating Provider/Extender: [name] in Treatment: 0 Constitutional Well-nourished and well-hydrated in no acute distress. Respiratory normal breathing without difficulty. Psychiatric this patient is able to make decisions and demonstrates good insight into disease process. Alert and Oriented x 3. pleasant and cooperative. Notes Upon inspection patient's wound actually has [name] very atypical appearance I feel like this is likely [name] cancerous lesion and not just an ulceration secondary to venous stasis. I discussed that with the patient today. Electronic Signature(s) Signed: 01/25/2023 1:20:48 PM By: Allen Derry PA-C Entered By: Allen Derry on 01/25/2023 13:20:47 [name], [name] ([id number]) [id number] Page 4 of 10 -------------------------------------------------------------------------------- Physician Orders Details Patient Name: Date of Service: [name] [name] 01/24/2023 1:30 PM Medical Record Number: [id number] Patient Account Number: [id number] Date of Birth/Sex: Treating RN: [birth year] (77 y.o. Arthur Holms Primary Care Provider: [name] Other Clinician: Referring Provider: Treating Provider/Extender: [name] in Treatment: 0 Verbal / Phone Orders: No Diagnosis Coding ICD-10 Coding Code Description [phone] Chronic venous hypertension (idiopathic) with ulcer and inflammation of bilateral lower  extremity L97.822 Non-pressure chronic ulcer of other part of left lower leg with fat layer exposed I73.89 Other specified peripheral vascular diseases I48.0 Paroxysmal atrial fibrillation I10 Essential (primary) hypertension Follow-up Appointments Return Appointment in 1 week. Nurse Visit as needed Wound Treatment  Wound #20 - Lower Leg Wound Laterality: Left, Lateral Prim Dressing: Hydrofera Blue Ready Transfer Foam, 2.5x2.5 (in/in) ary Discharge Instructions: Apply Hydrofera Blue Ready to wound bed as directed Secondary Dressing: Zetuvit Plus 4x4 (in/in) Compression Wrap: Urgo K2, two layer compression system, regular Consults Dermatology Electronic Signature(s) Signed: 01/25/2023 1:44:50 PM By: Allen Derry PA-C Signed: 01/29/2023 5:03:01 PM By: Elliot Gurney, BSN, RN, CWS, Kim RN, BSN Entered By: Elliot Gurney, BSN, RN, CWS, Kim on 01/24/2023 14:57:11 -------------------------------------------------------------------------------- Problem List Details Patient Name: Date of Service: [name], [name] 01/24/2023 1:30 PM Medical Record Number: [id number] Patient Account Number: [id number] Date of Birth/Sex: Treating RN: [birth year] (77 y.o. Arthur Holms Primary Care Provider: [name] Other Clinician: Referring Provider: Treating Provider/Extender: [name] in Treatment: 0 Active Problems ICD-10 Encounter Code Description Active Date MDM Diagnosis [phone] Chronic venous hypertension (idiopathic) with ulcer and inflammation of 01/24/2023 No Yes bilateral lower extremity L97.822 Non-pressure chronic ulcer of other part of left lower leg with fat layer exposed4/18/2024 No Yes [name], [name] ([id number]) [id number] Page 5 of 10 I73.89 Other specified peripheral vascular diseases 01/24/2023 No Yes I48.0 Paroxysmal atrial fibrillation 01/24/2023 No Yes I10 Essential (primary) hypertension 01/24/2023 No Yes Inactive Problems Resolved  Problems Electronic Signature(s) Signed: 01/24/2023 2:02:45 PM By: Allen Derry PA-C Entered By: Allen Derry on 01/24/2023 14:02:45 -------------------------------------------------------------------------------- Progress Note Details Patient Name: Date of Service: [name], [name] 01/24/2023 1:30 PM Medical Record Number: [id number] Patient Account Number: [id number] Date of Birth/Sex: Treating RN: 1945/10/26 (77 y.o. 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 the patient has had [name] vascular workup over 7 years ago and said everything was normal at that stage. He does not have any chronic problems except for cardiac issues which he sees [name] cardiologist in [place]. 08/15/2017 -- arterial and venous duplex studies still pending. 08/23/2017 -- venous reflux studies done on 08/13/2017 shows venous incompetence throughout the left lower extremity deep system and focally at the left saphenofemoral junction. 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 No evidence of SVT or DVT in bilateral lower extremities The patient has an appointment at the end of the month to get his arterial duplex study done 09/05/2017 -- the patient was seen at the vein and vascular office yesterday by Bary Castilla. ABI studies were notable for medial calcification and the toe brachial  indices were normal and bilateral ankle-brachial) waveforms were normal with triphasic flow. After review of his venous studies he was not [name] candidate for laser ablation and his lymphedema was to be treated with compression stockings and lymphedema pump pumps 09/12/2017 -- had [name] low arterial study done at the [facility] -- unable to obtain reliable ABI is due to medial calcification. Bilateral toe indices were normal with the right being 1.01 and the left being 0.92 and the waveforms were triphasic bilaterally. he did get hold of 30-40 mm compression stockings but is unable to put these on. We will try and get him alternative compression stockings.  09/26/17- he is here in follow up evaluation of [name] right lower extremity ulcer;he is compliant in wearing compression stocking; ulcer almost epithelialized , anticipate healing next appointment Readmission: 11/17 point upon evaluation patient's wound currently that he is seeing [place] for today is [name] skin cancerous lesion that was cleared away by his dermatologist on the left medial calf region. He tells me that this is [name] very similar thing to what he had done previously in fact the last time he saw him in 2018 this was also what was going on at that point. Nonetheless he feels that based on what he seeing currently that this is just having [name] lot of harder time healing although it is much closer to the surface than what he is experienced in the past. He notes that the initial removal was in June 2022 which was this year this is now November and still has not closed. He does have some edema and definitely I think that there is some venous component to his slow healing here. Also think that we can do something better than Vaseline to try to help with getting this to clear up as quickly as possible. He does have [name] history of atrial fibrillation and is on Eliquis otherwise he really has no major medical problems that would affect wound  healing. 09/07/2021 upon evaluation today patient actually appears to be doing significantly better after having wrapped him last week. Overall I think that this is making significant improvements at this time which is great news. I do not see any evidence of infection which is great news as well. No fevers, chills, nausea, vomiting, or diarrhea. 09/14/2021 upon evaluation today patient appears to be doing well with regard to his leg ulcer. He has been tolerating the dressing changes and overall I think that he is making excellent progress. I do not see any signs of active infection at this time. 09/21/2021 upon evaluation today patient actually appears to be making good progress with regard to his wound this is again measuring smaller today no [name], [name] ([id number]) [id number] Page 6 of 10 debridement seems to be necessary. We have been using [name] silver collagen dressing and I think that is doing an awesome job. 09/28/2021 upon evaluation today patient appears to be doing well with regard to his leg currently. I do not see any signs of active infection at this time which is great news. No fevers, chills, nausea, vomiting, or diarrhea. I think this wound is very close to complete resolution. 10/12/2021 upon evaluation today patient actually appears to be doing awesome in regard to his leg ulcer. In fact this appears to be completely healed based on what I am seeing currently. I do not see any evidence of active infection locally nor systemically at this time which is also great news. No fevers, chills, nausea, vomiting, or diarrhea. Readmission: 12/07/2021 upon evaluation today patient presents for readmission here in the clinic. He was discharged on 10/12/2021 is completely healed. Unfortunately this has reopened at this point and he is having continual issues with new blisters over both lower extremities. This is even worse than what we previously saw. Nonetheless we did  actually check his ABIs today and it did reveal that his ABIs were 0.55 on the left and 0.57 on the right. Subsequently this is [name] definite change from his last arterial study which showed that he did have good blood flow at 1.01 on the right and 0.92 on the left and that was right  at the beginning of 2019. Nonetheless based on what we see currently I do think he tolerated the 3 layer compression wrap but I do believe that we probably need to get him tested for his arterial flow in order to see where things stand and if there is something we can do there that would help prevent this from continue to be an ongoing issue. He did not utilize compression socks in the interim from when he was last here till this time. That something is probably going to need lifelong going forward as well. 3/9; patient presents for follow-up. He has no issues or complaints today. He tolerated the compression wrap well. He had ABIs with TBI's done. He denies signs of infection. 12/21/2021 upon evaluation today patient appears to be doing well with regard to the wounds on his legs. Both are showing signs of significant improvement which is great news although I do believe some sharp debridement would be of benefit here as well. 12/28/2021 upon evaluation today patient appears to be doing well with regard to his wounds. Everything is showing signs of excellent improvement which I am very pleased about. I think that we are headed in the right direction here. Fortunately there does not appear to be any evidence of infection which is great news there is [name] little bit of hypergranulation. 01/04/2022 upon evaluation today patient appears to be doing well with regard to his wounds 2 of them are healed 1 is almost so and the other 1 is significantly better. Overall I am extremely pleased with where we stand and I think that he is making excellent progress here. I do not see any evidence of active infection locally nor systemically at  this time. 01-16-2022 upon evaluation today patient's wound on the left leg is showing signs of doing quite well. Has not completely cleared at this point but it is much improved. Fortunately I do not see any signs of infection at this time. No fevers, chills, nausea, vomiting, or diarrhea. 01-23-2022 upon evaluation today patient's wound of the left leg actually appears to be pretty much completely healed which is great news. I do not see any signs of active infection locally or systemically which is excellent. With that being said on the right leg what wound is measuring smaller the other 1 is [name] new wound that just showed up fortunately its not too bad. Has been using Xeroform here and that seems to be doing decently well which is great news. Unfortunately his blood pressure is significantly high we gave him the readings for the past 4-5 visits as well as [name] recommendation to make an appointment to go discuss this with his primary care provider patient states that he is going to look into doing this. 01-30-2022 upon evaluation today patient appears to be doing well with regard to his left leg everything appears to be healed. On the right leg the more anterior wound is healed the more medial wound that I been concerned about [name] possible skin cancer unfortunately still does not look great to me. I do believe that we should probably do [name] biopsy I have talked about it with him [name] few times I think though it is probably time to go ahead and do this at this point. 02-09-2022 upon evaluation today patient appears to be doing well with regard to his legs. On the left this appears to be completely healed. On the right he does have 2 areas and be perfectly honest one of them is  [name] skin cancer that he is going to the Mohs surgery clinic for the other seems to be healing nicely. Readmission: 08-02-2022 upon evaluation today patient appears for reevaluation here in our clinic concerning issues that he has been having with  wounds over the bilateral lower extremities. I last saw him in May 2023 and at that point we had him completely healed. Unfortunately he is tells me this has broken down to some degree since that point. 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 He tells me this 1 seems to just be getting bigger and bigger not improving. Fortunately he is not having any significant pain which is good news he does have quite [name] bit of swelling and he tells me that his fluid pills are not recommended for him to take daily but just in 3-day intervals here and there. 08-09-2022 upon evaluation today patient appears to be doing still somewhat poorly in regard to his legs although in general he does not appear to be feeling as good as he has been. Fortunately there does not appear to be any signs of infection which is good news. With that being said he is having some issues here with having and overall poor feeling in general which again is good I think going to be the biggest complicating factor. He actually seems to be coughing I do not hear any wheezing right now I did listen to his chest he did not have good airflow down low however makes me suspicious for bronchitis or even possibly pneumonia which could be part of what is going on here as well. Fortunately I do not see any evidence of anything worsening in regard to his legs but I definitely believe that he needs to continue with the compression wraps he took them off yesterday to shower has not had anything on for 24 hours that is why his legs are so swollen today. With regard to his pathology report I did review that it showed some squamous abnormality but no signs of distinct carcinoma. With that being said it was saying that it could be adjacent to [name] squamous cell  carcinoma nonetheless my suggestion is can be that we have the patient take copy of this report and give it to his Mohs surgeon in order for them to see if there is anything they feel like needs to be done further. With that being said right now I feel like the primary thing is going to be for [place] to try to get his swelling down and keep that down into that hand since he is having so much drainage I believe we can have to bring him in for dressing changes twice [name] week doing [name] nurse visit on Mondays. 11/9; since the patient was last here he spent the night in the emergency room he received IV Lasix. Also received antibiotics although he was not discharged on either 1 of these. He also saw his cardiology office who put him on regular Lasix 20 mg [previously on as needed Lasix 20 mg]. Per our intake nurse the swelling in his legs is remarkably better but he still has bilateral lower extremity wounds. He still has wounds on the bilateral lower extremities most problematically on the left lateral calf. He has been using silver alginate under  3 layer compression. 08-23-2022 upon evaluation today patient appears to be doing much better than the last time I saw him 2 weeks ago. At that point I was very concerned about how he was doing he did see Dr. Sherrie Mustache his primary care provider they got him on some blood pressure medication in general his color and overall appearance looks to be doing much improved compared to the last time I saw him. 09-04-2022 upon evaluation today patient appears to be doing well currently in regard to his wounds. Everything is showing signs of improvement which is great news. Fortunately there does not appear to be any signs of active infection locally or systemically at this time. No fevers, chills, nausea, vomiting, or diarrhea. 09-10-2022 upon evaluation today patient appears to be doing better in regard to his wounds although the [facility] was extremely stuck to the wound  bed. Fortunately there does not appear to be any signs of infection locally or systemically at this time which is great news. No fevers, chills, nausea, vomiting, or diarrhea. 09-17-2022 upon evaluation today patient appears to be doing well currently in regard to his wounds in general. The right leg actually showing signs of excellent [name], [name] ([id number]) [id number] Page 7 of 10 improvement and very pleased with where things stand in that regard. Fortunately I do not see any evidence of infection locally or systemically at this time which is great news. No fevers, chills, nausea, vomiting, or diarrhea. 09-24-2022 upon evaluation today patient appears to be doing well currently in regard to his wounds. Things look to be doing quite well. 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 He is seeing his dermatologist tomorrow in that regard. With that being said other than that however he seems to really be making some pretty good progress here which is good news. No fevers, chills, nausea, vomiting, or diarrhea. 12/26; the patient has 2 open wounds remaining on the left leg. One is on the left anterior mid tibia and the other is on the right lateral knee just outside of the popliteal fossa. 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 The patient has been to see dermatology Dr. Adolphus Birchwood who apparently is making him [name] referral to the [facility] center. He does not yet have an appointment 10-12-2022 upon evaluation today patient appears to be doing well currently in regard to his wound. He has been tolerating the dressing changes without complication and overall feel like we are headed in the right direction. Fortunately I do not see any signs of infection locally or systemically at this time  which is great news. No fevers, chills, nausea, vomiting, or diarrhea. 10-23-2022 upon evaluation today patient appears to be doing well currently in regard to his wound. He has been tolerating the dressing changes without complication. Fortunately there does not appear to be any signs of active infection locally nor systemically which is great news and overall I am extremely pleased with where we stand currently. No fevers, chills, nausea, vomiting, or diarrhea. 10-26-2022 upon evaluation today patient appears to be doing well currently in regard to his wounds. Everything is showing signs of improvement and this is great news. Fortunately I see no evidence of active infection systemically. He does seem to be doing much better in regard to the local infection in regard to  his leg. The smell is also greatly improved. Overall I am extremely happy with where we stand today. This is after just [name] few days with the antibiotic on board. 11-05-2022 upon evaluation today patient appears to be doing well currently in regard to his wounds although the wound where they performed the Mohs surgery does look [name] little bit hyper granulated I think switching to [facility] may be better for him. He voiced understanding. Fortunately there does not appear to be any evidence of active infection locally nor systemically at this time. 11-12-2022 upon evaluation today patient appears to be doing better in regard to both wounds he has been tolerating the dressing changes without complication. There is no signs of infection and in general I think you are doing quite well. No fevers, chills, nausea, vomiting, or diarrhea. 11-20-2022 upon evaluation today patient appears to be doing well currently in regard to his wounds. He has been tolerating the dressing changes without complication. Fortunately there does not appear to be any signs of infection at this time. No fevers, chills, nausea, vomiting, or diarrhea. 11-27-2022 upon  evaluation today patient appears to be doing somewhat poorly in regard to his leg in general he has [name] lot of areas where he looks like he had some spots that popped up. There with regard to new possible blisters. In general I am actually very concerned about the fact that the wrap may be causing some irritation here. I think that we can try to not do the wrap for 1 week, and given the prescription for mupirocin ointment which I will send into the pharmacy for him. 12-04-2022 upon evaluation today patient appears to be doing well currently in regard to his wounds in fact he appears to be pretty much completely healed based on what I am seeing at this point. I do not see any signs of active infection locally nor systemically at this time which is great news. No fevers, chills, nausea, vomiting, or diarrhea. 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 I discussed that with him today he is aware of the situation.-2024 upon evaluation patient presents for readmission here in the clinic actually last saw him December 04, 2022. With that being said previously his dermatologist had asked that if there was something the need to be addressed from [name] dermatology standpoint and specifically biopsy that we make referral back to them to allow them to do it which I am definitely happy to do. Patient History Information obtained from Patient. Allergies No Known Allergies Family History Cancer - Father, Diabetes - Paternal Grandparents, Heart Disease - Father, Hypertension - Mother,Father,Siblings, No family history of Hereditary Spherocytosis, Kidney Disease, Lung Disease, Seizures, Stroke, Thyroid Problems, Tuberculosis. Social History Never smoker, Marital Status - Divorced, Alcohol Use - Rarely, Drug Use - No History, Caffeine Use - Never. Medical  History Cardiovascular Patient has history of Arrhythmia - [name]-fib, Hypertension Denies history of Angina Endocrine Denies history of Type II Diabetes Musculoskeletal Patient has history of Gout Medical [name] Surgical History Notes nd Endocrine pt states never told he was [name] diabetic [address] [name], [name] ([id number]) [id number] Page 8 of 10 Well-nourished and well-hydrated in no acute distress. Vitals Time Taken: 2:48 PM, Height: 75 in, Weight: 220 lbs, BMI: 27.5, Temperature: 98.1 F, Pulse: 47 bpm, Respiratory Rate: 18 breaths/min, Blood Pressure:  155/73 mmHg. Respiratory normal breathing without difficulty. Psychiatric this patient is able to make decisions and demonstrates good insight into disease process. Alert and Oriented x 3. pleasant and cooperative. General Notes: Upon inspection patient's wound actually has [name] very atypical appearance I feel like this is likely [name] cancerous lesion and not just an ulceration secondary to venous stasis. I discussed that with the patient today. Integumentary (Hair, Skin) Wound #20 status is Open. Original cause of wound was Gradually Appeared. The date acquired was: 12/24/2022. The wound is located on the Left,Lateral Lower Leg. The wound measures 5cm length x 4.4cm width x 0.1cm depth; 17.279cm^2 area and 1.728cm^3 volume. There is Fat Layer (Subcutaneous Tissue) exposed. There is no tunneling or undermining noted. There is [name] large amount of serous drainage noted. The wound margin is thickened. There is large (67- 100%) red, hyper - granulation within the wound bed. There is [name] small (1-33%) amount of necrotic tissue within the wound bed including Adherent Slough. 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 Nurse Visit as needed Consults ordered were: Dermatology WOUND #20: - Lower Leg Wound Laterality: Left, Lateral Prim Dressing: Hydrofera Blue Ready Transfer Foam, 2.5x2.5 (in/in) ary Discharge Instructions: Apply Hydrofera Blue Ready to wound bed as directed Secondary Dressing: Zetuvit Plus 4x4 (in/in) Com pression Wrap: Urgo K2, two layer compression system, regular 1. I am recommend based on what I am seeing currently that we going make referral to [facility] dermatology to have them further evaluate this wound. They were supposed of had [name] note in the patient's chart they would work him in quickly if there was something like this going on in the future and hopefully they will be able to get him in to be seen as quickly as possible. 2. I am good recommend as well that the patient should go ahead and have the Hydrofera Blue and the K2 compression wrap which I think should also benefit him. We will see patient back for reevaluation in 1 week here in the clinic. If anything worsens or changes patient will contact our office for additional recommendations. Electronic Signature(s) Signed: 01/25/2023 1:21:27 PM By: Allen Derry PA-C Entered By: Allen Derry on 01/25/2023 13:21:26 -------------------------------------------------------------------------------- ROS/PFSH Details Patient Name: Date of Service: [name], [name] 01/24/2023 1:30 PM Medical Record Number: [id number] Patient Account Number: [id number] Date of Birth/Sex: Treating RN: Mar 03, 1946 (77 y.o. Arthur Holms Primary Care Provider: [name] Other Clinician: Referring Provider: Treating Provider/Extender: [name] in Treatment: [address] [place], [place] [name] ([id number]) [id number] Page 9 of 10 Information Obtained  From Patient Cardiovascular Medical History: Positive for: Arrhythmia - [name]-fib; Hypertension Negative for: Angina Endocrine Medical History: Negative for: Type II Diabetes Past Medical History Notes: pt states never told he was [name] diabetic Musculoskeletal Medical History: Positive for: Gout Immunizations Pneumococcal Vaccine: Received Pneumococcal Vaccination: Yes Received Pneumococcal Vaccination On or After 60th Birthday: Yes Implantable Devices None Family and Social History Cancer: Yes - Father; Diabetes: Yes - Paternal Grandparents; Heart Disease: Yes - Father; Hereditary Spherocytosis: No; Hypertension: Yes - Mother,Father,Siblings; Kidney Disease: No; Lung Disease: No; Seizures: No; Stroke: No; Thyroid Problems: No; Tuberculosis: No; Never smoker; Marital Status - Divorced; Alcohol Use: Rarely; Drug Use: No History; Caffeine Use: Never;  Financial Concerns: No; Food, Clothing or Shelter Needs: No; Support System Lacking: No; Transportation Concerns: No Electronic Signature(s) Signed: 01/25/2023 1:44:50 PM By: Allen Derry PA-C Signed: 01/29/2023 5:03:01 PM By: Elliot Gurney, BSN, RN, CWS, Kim RN, BSN Entered By: Elliot Gurney, BSN, RN, CWS, Kim on 01/24/2023 13:57:43 -------------------------------------------------------------------------------- SuperBill Details Patient Name: Date of Service: [name] [name], [name] 01/24/2023 Medical Record Number: [id number] Patient Account Number: [id number] Date of Birth/Sex: Treating RN: 1946/02/04 (77 y.o. Loel Lofty, Selena Batten Primary Care Provider: [name] Other Clinician: Referring Provider: Treating Provider/Extender: [name] in Treatment: 0 Diagnosis Coding ICD-10 Codes Code Description [phone] Chronic venous hypertension (idiopathic) with ulcer and inflammation of bilateral lower extremity L97.822 Non-pressure chronic ulcer of other part of left lower leg with fat layer exposed I73.89 Other specified peripheral vascular  diseases I48.0 Paroxysmal atrial fibrillation I10 Essential (primary) hypertension Facility Procedures : CPT4 Code: [id number] Description: 99213 - WOUND CARE VISIT-LEV 3 EST PT Modifier: Quantity: 1 : CPT4 Code: 84696295 Description: (Facility Use Only) 29581LT - APPLY MULTLAY COMPRS LWR LT LEG Modifier: Quantity: 1 Physician Procedures : CPT4 Code Description Modifier [id number] 99214 - WC PHYS LEVEL 4 - EST PT RODDY, BELLAMY ([id number]) [id number] ICD-10 Diagnosis Description I87.333 Chronic venous hypertension (idiopathic) with ulcer and inflammation of  bilateral lower extremity L97.822 Non-pressure chronic ulcer of other part of left lower leg with fat layer exposed I73.89 Other specified peripheral vascular diseases I48.0 Paroxysmal atrial fibrillation Quantity: 1 17.pdf Page 10 of 10 Electronic Signature(s) Signed: 01/25/2023 1:21:44 PM By: Allen Derry PA-C Entered By: Allen Derry on 01/25/2023 13:21:44

## 2023-01-31 ENCOUNTER — Encounter: Payer: Medicare PPO | Admitting: Physician Assistant

## 2023-01-31 DIAGNOSIS — L97822 Non-pressure chronic ulcer of other part of left lower leg with fat layer exposed: Secondary | ICD-10-CM | POA: Diagnosis not present

## 2023-01-31 DIAGNOSIS — Z7901 Long term (current) use of anticoagulants: Secondary | ICD-10-CM | POA: Diagnosis not present

## 2023-01-31 DIAGNOSIS — I10 Essential (primary) hypertension: Secondary | ICD-10-CM | POA: Diagnosis not present

## 2023-01-31 DIAGNOSIS — I87333 Chronic venous hypertension (idiopathic) with ulcer and inflammation of bilateral lower extremity: Secondary | ICD-10-CM | POA: Diagnosis not present

## 2023-01-31 DIAGNOSIS — I428 Other cardiomyopathies: Secondary | ICD-10-CM | POA: Diagnosis not present

## 2023-01-31 DIAGNOSIS — I480 Paroxysmal atrial fibrillation: Secondary | ICD-10-CM | POA: Diagnosis not present

## 2023-01-31 DIAGNOSIS — L97812 Non-pressure chronic ulcer of other part of right lower leg with fat layer exposed: Secondary | ICD-10-CM | POA: Diagnosis not present

## 2023-01-31 DIAGNOSIS — I739 Peripheral vascular disease, unspecified: Secondary | ICD-10-CM | POA: Diagnosis not present

## 2023-01-31 DIAGNOSIS — I872 Venous insufficiency (chronic) (peripheral): Secondary | ICD-10-CM | POA: Diagnosis not present

## 2023-02-01 NOTE — Progress Notes (Signed)
ABDULAI, BLAYLOCK [name] ([id number]) [id number] Page 1 of 9 Visit Report for 01/31/2023 Chief Complaint Document Details Patient Name: Date of Service: [name] 01/31/2023 7:45 A M Medical Record Number: [id number] Patient Account Number: [id number] Date of Birth/Sex: Treating RN: 1946/04/05 (77 y.o. Arthur Holms Primary Care Provider: [name] Other Clinician: Referring Provider: Treating Provider/Extender: [name] in Treatment: 1 Information Obtained from: Patient Chief Complaint Left LE Ulcer Electronic Signature(s) Signed: 01/31/2023 5:47:58 PM By: Allen Derry PA-C Entered By: Allen Derry on 01/31/2023 07:56:44 -------------------------------------------------------------------------------- HPI Details Patient Name: Date of Service: [name] [name], [name] 01/31/2023 7:45 A M Medical Record Number: [id number] Patient Account Number: [id number] Date of Birth/Sex: Treating RN: September 19, 1946 (77 y.o. Loel Lofty, Selena Batten Primary Care Provider: [name] Other Clinician: Referring Provider: Treating Provider/Extender: [name] in Treatment: 1 History of Present Illness HPI Description: 77 year old male who has a past medical history of essential hypertension, chronic atrial fibrillation, peripheral vascular disease, nonischemic cardiomyopathy,venous stasis dermatitis, gouty arthropathy, basal cell carcinoma of the right lower extremity, benign prostatic hypertrophy, long- term use of anticoagulation therapy, hyperglycemia and exercise intolerance has never been a smoker. the patient has had a vascular workup over 7 years ago and said everything was normal at that stage. He does not have any chronic problems except for cardiac issues which he sees a cardiologist in [place]. 08/15/2017 -- arterial and venous duplex studies still pending. 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 No evidence of SVT or DVT in bilateral lower extremities The patient has an appointment at the end of the month to get his arterial duplex study done 09/05/2017 -- the patient was seen at the vein and vascular office yesterday by Bary Castilla. ABI studies were notable for medial calcification and the toe brachial indices were normal and bilateral ankle-brachial) waveforms were normal with triphasic flow. After review of his venous studies he was not a candidate for laser ablation and his lymphedema was to be treated with compression stockings and lymphedema pump pumps 09/12/2017 -- had a low arterial study done at the [facility] -- unable to obtain reliable ABI is due to medial calcification. Bilateral toe indices were normal with the right being 1.01 and the left being 0.92 and the waveforms were triphasic bilaterally. he did get hold of 30-40 mm compression stockings but is unable to put these on. We will try and get him alternative compression stockings. 09/26/17- he is here in follow up evaluation of a right lower extremity ulcer;he is compliant in wearing compression stocking; ulcer almost epithelialized , anticipate healing next appointment Readmission: 11/17 point upon evaluation patient's wound currently that he is seeing [place] for today is a skin cancerous lesion that was cleared away by his dermatologist on the left medial calf region. He tells me that this is a very similar thing to what he had done previously in fact the last time he saw him in 2018 this was also what was going on at that point. Nonetheless he feels that based on what he seeing currently that this is just having a lot of harder time healing although it is much closer to the surface than what he is experienced in the past. He notes  that the initial removal was in June  2022 which was this year this is now November and still has not closed. He does have some edema and definitely I think that there is some venous component to his slow healing here. Also think that we can do something better than Vaseline to try to help with getting this to clear up as quickly as possible. He does have a history of atrial fibrillation and is on Eliquis otherwise he really has no major medical problems that would affect wound healing. 09/07/2021 upon evaluation today patient actually appears to be doing significantly better after having wrapped him last week. Overall I think that this is making significant improvements at this time which is great news. I do not see any evidence of infection which is great news as well. No fevers, chills, nausea, vomiting, or diarrhea. 09/14/2021 upon evaluation today patient appears to be doing well with regard to his leg ulcer. He has been tolerating the dressing changes and overall I think that he is making excellent progress. I do not see any signs of active infection at this time. 09/21/2021 upon evaluation today patient actually appears to be making good progress with regard to his wound this is again measuring smaller today no debridement seems to be necessary. We have been using a silver collagen dressing and I think that is doing an awesome job. [name], [name] ([id number]) [id number] Page 2 of 9 09/28/2021 upon evaluation today patient appears to be doing well with regard to his leg currently. I do not see any signs of active infection at this time which is great news. No fevers, chills, nausea, vomiting, or diarrhea. I think this wound is very close to complete resolution. 10/12/2021 upon evaluation today patient actually appears to be doing awesome in regard to his leg ulcer. In fact this appears to be completely healed based on what I am seeing currently. I do not see any evidence  of active infection locally nor systemically at this time which is also great news. No fevers, chills, nausea, vomiting, or diarrhea. Readmission: 12/07/2021 upon evaluation today patient presents for readmission here in the clinic. He was discharged on 10/12/2021 is completely healed. Unfortunately this has reopened at this point and he is having continual issues with new blisters over both lower extremities. This is even worse than what we previously saw. Nonetheless we did actually check his ABIs today and it did reveal that his ABIs were 0.55 on the left and 0.57 on the right. Subsequently this is a definite change from his last arterial study which showed that he did have good blood flow at 1.01 on the right and 0.92 on the left and that was right at the beginning of 2019. Nonetheless based on what we see currently I do think he tolerated the 3 layer compression wrap but I do believe that we probably need to get him tested for his arterial flow in order to see where things stand and if there is something we can do there that would help prevent this from continue to be an ongoing issue. He did not utilize compression socks in the interim from when he was last here till this time. That something is probably going to need lifelong going forward as well. 3/9; patient presents for follow-up. He has no issues or complaints today. He tolerated the compression wrap well. He had ABIs with TBI's done. He denies signs of infection. 12/21/2021 upon evaluation today patient appears to be doing well with regard to the wounds on his legs. Both  are showing signs of significant improvement which is great news although I do believe some sharp debridement would be of benefit here as well. 12/28/2021 upon evaluation today patient appears to be doing well with regard to his wounds. Everything is showing signs of excellent improvement which I am very pleased about. I think that we are headed in the right direction here.  Fortunately there does not appear to be any evidence of infection which is great news there is a little bit of hypergranulation. 01/04/2022 upon evaluation today patient appears to be doing well with regard to his wounds 2 of them are healed 1 is almost so and the other 1 is significantly better. Overall I am extremely pleased with where we stand and I think that he is making excellent progress here. I do not see any evidence of active infection locally nor systemically at this time. 01-16-2022 upon evaluation today patient's wound on the left leg is showing signs of doing quite well. Has not completely cleared at this point but it is much improved. Fortunately I do not see any signs of infection at this time. No fevers, chills, nausea, vomiting, or diarrhea. 01-23-2022 upon evaluation today patient's wound of the left leg actually appears to be pretty much completely healed which is great news. I do not see any signs of active infection locally or systemically which is excellent. With that being said on the right leg what wound is measuring smaller the other 1 is a new wound that just showed up fortunately its not too bad. Has been using Xeroform here and that seems to be doing decently well which is great news. Unfortunately his blood pressure is significantly high we gave him the readings for the past 4-5 visits as well as a recommendation to make an appointment to go discuss this with his primary care provider patient states that he is going to look into doing this. 01-30-2022 upon evaluation today patient appears to be doing well with regard to his left leg everything appears to be healed. On the right leg the more anterior wound is healed the more medial wound that I been concerned about a possible skin cancer unfortunately still does not look great to me. I do believe that we should probably do a biopsy I have talked about it with him a few times I think though it is probably time to go ahead and do  this at this point. 02-09-2022 upon evaluation today patient appears to be doing well with regard to his legs. On the left this appears to be completely healed. On the right he does have 2 areas and be perfectly honest one of them is a skin cancer that he is going to the Mohs surgery clinic for the other seems to be healing nicely. Readmission: 08-02-2022 upon evaluation today patient appears for reevaluation here in our clinic concerning issues that he has been having with wounds over the bilateral lower extremities. I last saw him in May 2023 and at that point we had him completely healed. Unfortunately he is tells me this has broken down to some degree since that point. 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 He tells me this 1 seems to just be getting bigger and bigger not improving. Fortunately he is not having  any significant pain which is good news he does have quite a bit of swelling and he tells me that his fluid pills are not recommended for him to take daily but just in 3-day intervals here and there. 08-09-2022 upon evaluation today patient appears to be doing still somewhat poorly in regard to his legs although in general he does not appear to be feeling as good as he has been. Fortunately there does not appear to be any signs of infection which is good news. With that being said he is having some issues here with having and overall poor feeling in general which again is good I think going to be the biggest complicating factor. He actually seems to be coughing I do not hear any wheezing right now I did listen to his chest he did not have good airflow down low however makes me suspicious for bronchitis or even possibly pneumonia which could be part of what is going on here as well.  Fortunately I do not see any evidence of anything worsening in regard to his legs but I definitely believe that he needs to continue with the compression wraps he took them off yesterday to shower has not had anything on for 24 hours that is why his legs are so swollen today. With regard to his pathology report I did review that it showed some squamous abnormality but no signs of distinct carcinoma. With that being said it was saying that it could be adjacent to a squamous cell carcinoma nonetheless my suggestion is [name] be that we have the patient take copy of this report and give it to his Mohs surgeon in order for them to see if there is anything they feel like needs to be done further. With that being said right now I feel like the primary thing is going to be for [place] to try to get his swelling down and keep that down into that hand since he is having so much drainage I believe we [name] have to bring him in for dressing changes twice a week doing a nurse visit on Mondays. 11/9; since the patient was last here he spent the night in the emergency room he received IV Lasix. Also received antibiotics although he was not discharged on either 1 of these. He also saw his cardiology office who put him on regular Lasix 20 mg [previously on as needed Lasix 20 mg]. Per our intake nurse the swelling in his legs is remarkably better but he still has bilateral lower extremity wounds. He still has wounds on the bilateral lower extremities most problematically on the left lateral calf. He has been using silver alginate under 3 layer compression. 08-23-2022 upon evaluation today patient appears to be doing much better than the last time I saw him 2 weeks ago. At that point I was very concerned about how he was doing he did see Dr. Sherrie Mustache his primary care provider they got him on some blood pressure medication in general his color and overall appearance looks to be doing much improved compared to the last time I saw  him. 09-04-2022 upon evaluation today patient appears to be doing well currently in regard to his wounds. Everything is showing signs of improvement which is great news. Fortunately there does not appear to be any signs of active infection locally or systemically at this time. No fevers, chills, nausea, vomiting, or diarrhea. 09-10-2022 upon evaluation today patient appears to be doing better in regard to his wounds although the [facility]  Blue was extremely stuck to the wound bed. Fortunately there does not appear to be any signs of infection locally or systemically at this time which is great news. No fevers, chills, nausea, vomiting, or diarrhea. 09-17-2022 upon evaluation today patient appears to be doing well currently in regard to his wounds in general. The right leg actually showing signs of excellent improvement and very pleased with where things stand in that regard. Fortunately I do not see any evidence of infection locally or systemically at this time VIDYUTH, BELSITO ([id number]) [phone].pdf Page 3 of 9 which is great news. No fevers, chills, nausea, vomiting, or diarrhea. 09-24-2022 upon evaluation today patient appears to be doing well currently in regard to his wounds. Things look to be doing quite well. With that being said he did have a result unfortunately on the pathology which showed that he did have a squamous cell carcinoma noted on the biopsy sample I sent last week. He is seeing his dermatologist tomorrow in that regard. With that being said other than that however he seems to really be making some pretty good progress here which is good news. No fevers, chills, nausea, vomiting, or diarrhea. 12/26; the patient has 2 open wounds remaining on the left leg. One is on the left anterior mid tibia and the other is on the right lateral knee just outside of the popliteal fossa. 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 The  patient has been to see dermatology Dr. Adolphus Birchwood who apparently is making him a referral to the [facility] center. He does not yet have an appointment 10-12-2022 upon evaluation today patient appears to be doing well currently in regard to his wound. He has been tolerating the dressing changes without complication and overall feel like we are headed in the right direction. Fortunately I do not see any signs of infection locally or systemically at this time which is great news. No fevers, chills, nausea, vomiting, or diarrhea. 10-23-2022 upon evaluation today patient appears to be doing well currently in regard to his wound. He has been tolerating the dressing changes without complication. Fortunately there does not appear to be any signs of active infection locally nor systemically which is great news and overall I am extremely pleased with where we stand currently. No fevers, chills, nausea, vomiting, or diarrhea. 10-26-2022 upon evaluation today patient appears to be doing well currently in regard to his wounds. Everything is showing signs of improvement and this is great news. Fortunately I see no evidence of active infection systemically. He does seem to be doing much better in regard to the local infection in regard to his leg. The smell is also greatly improved. Overall I am extremely happy with where we stand today. This is after just a few days with the antibiotic on board. 11-05-2022 upon evaluation today patient appears to be doing well currently in regard to his wounds although the wound where they performed the Mohs surgery does look a little bit hyper granulated I think switching to [facility] may be better for him. He voiced understanding. Fortunately there does not appear to be any evidence of active infection locally nor systemically at this time. 11-12-2022 upon evaluation today patient appears to be doing better in regard to both wounds he has been tolerating the dressing  changes without complication. There is no signs of infection and in general I think you are doing quite well. No fevers, chills, nausea, vomiting, or diarrhea. 11-20-2022 upon  evaluation today patient appears to be doing well currently in regard to his wounds. He has been tolerating the dressing changes without complication. Fortunately there does not appear to be any signs of infection at this time. No fevers, chills, nausea, vomiting, or diarrhea. 11-27-2022 upon evaluation today patient appears to be doing somewhat poorly in regard to his leg in general he has a lot of areas where he looks like he had some spots that popped up. There with regard to new possible blisters. In general I am actually very concerned about the fact that the wrap may be causing some irritation here. I think that we [name] try to not do the wrap for 1 week, and given the prescription for mupirocin ointment which I will send into the pharmacy for him. 12-04-2022 upon evaluation today patient appears to be doing well currently in regard to his wounds in fact he appears to be pretty much completely healed based on what I am seeing at this point. I do not see any signs of active infection locally nor systemically at this time which is great news. No fevers, chills, nausea, vomiting, or diarrhea. 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 I discussed that with him today he is aware of the situation.-2024 upon evaluation patient presents for readmission here in the clinic actually last saw him December 04, 2022. With that being said previously his dermatologist had asked that if there was something the need to be addressed from a dermatology standpoint and specifically biopsy that we make referral back to them to allow them to do it which I am definitely happy to do. 01-31-2023  upon evaluation today patient appears to be doing well currently in regard to his wound in fact this looks better he did see Dr. Adolphus Birchwood his dermatologist yesterday and they did perform a biopsy. With that being said he actually looks like he is doing much better Dr. Adolphus Birchwood feels like this may not be a cancerous lesion which will be very good news at the same time I definitely wanted to make sure especially considering his history and the way this looks when we saw him last week. Nonetheless I am extremely pleased with the fact that he is doing so much better at this point. Electronic Signature(s) Signed: 01/31/2023 5:44:12 PM By: Allen Derry PA-C Entered By: Allen Derry on 01/31/2023 17:44:12 -------------------------------------------------------------------------------- Physical Exam Details Patient Name: Date of Service: [name] [name], [name] 01/31/2023 7:45 A M Medical Record Number: [id number] Patient Account Number: [id number] Date of Birth/Sex: Treating RN: 1945/12/09 (77 y.o. Arthur Holms Primary Care Provider: [name] Other Clinician: Referring Provider: Treating Provider/Extender: [name] in Treatment: 1 Constitutional Well-nourished and well-hydrated in no acute distress. Respiratory normal breathing without difficulty. Psychiatric this patient is able to make decisions and demonstrates good insight into disease process. Alert and Oriented x 3. pleasant and cooperative. Notes Upon inspection patient's wound bed actually showed signs of good granulation epithelization at this point. Fortunately I do not see any evidence of infection locally nor systemically which is great news and I think he is actually doing much better. If his biopsy indeed comes back negative then I think will be in good shape to continue with the wound measures as before which does seem to be doing excellent. [name], [name] (  [id number]) [id number]  Page 4 of 9 Electronic Signature(s) Signed: 01/31/2023 5:44:38 PM By: Allen Derry PA-C Entered By: Allen Derry on 01/31/2023 17:44:38 -------------------------------------------------------------------------------- Physician Orders Details Patient Name: Date of Service: [name] [name], [name] 01/31/2023 7:45 A M Medical Record Number: [id number] Patient Account Number: [id number] Date of Birth/Sex: Treating RN: [birth year] (77 y.o. Arthur Holms Primary Care Provider: [name] Other Clinician: Referring Provider: Treating Provider/Extender: [name] in Treatment: 1 Verbal / Phone Orders: No Diagnosis Coding ICD-10 Coding Code Description [phone] Chronic venous hypertension (idiopathic) with ulcer and inflammation of bilateral lower extremity L97.822 Non-pressure chronic ulcer of other part of left lower leg with fat layer exposed I73.89 Other specified peripheral vascular diseases I48.0 Paroxysmal atrial fibrillation I10 Essential (primary) hypertension Follow-up Appointments Return Appointment in 1 week. Nurse Visit as needed Wound Treatment Wound #20 - Lower Leg Wound Laterality: Left, Lateral Prim Dressing: Hydrofera Blue Ready Transfer Foam, 2.5x2.5 (in/in) ary Discharge Instructions: Apply Hydrofera Blue Ready to wound bed as directed Secondary Dressing: Zetuvit Plus 4x4 (in/in) Compression Wrap: Urgo K2, two layer compression system, regular Electronic Signature(s) Signed: 01/31/2023 3:23:16 PM By: Elliot Gurney, BSN, RN, CWS, Kim RN, BSN Signed: 01/31/2023 5:47:58 PM By: Allen Derry PA-C Entered By: Elliot Gurney, BSN, RN, CWS, Kim on 01/31/2023 08:43:46 -------------------------------------------------------------------------------- Problem List Details Patient Name: Date of Service: [name] [name], [name] 01/31/2023 7:45 A M Medical Record Number: [id number] Patient Account Number: [id number] Date of Birth/Sex: Treating RN: [birth year] (77 y.o. Arthur Holms Primary Care Provider: [name] Other Clinician: Referring Provider: Treating Provider/Extender: [name] in Treatment: 1 Active Problems ICD-10 Encounter Code Description Active Date MDM Diagnosis [phone] Chronic venous hypertension (idiopathic) with ulcer and inflammation of 01/24/2023 No Yes bilateral lower extremity L97.822 Non-pressure chronic ulcer of other part of left lower leg with fat layer exposed4/18/2024 No Yes [name], [name] ([id number]) [id number] Page 5 of 9 I73.89 Other specified peripheral vascular diseases 01/24/2023 No Yes I48.0 Paroxysmal atrial fibrillation 01/24/2023 No Yes I10 Essential (primary) hypertension 01/24/2023 No Yes Inactive Problems Resolved Problems Electronic Signature(s) Signed: 01/31/2023 5:47:58 PM By: Allen Derry PA-C Entered By: Allen Derry on 01/31/2023 07:56:36 -------------------------------------------------------------------------------- Progress Note Details Patient Name: Date of Service: [name] [name], [name] 01/31/2023 7:45 A M Medical Record Number: [id number] Patient Account Number: [id number] Date of Birth/Sex: Treating RN: June 05, 1946 (77 y.o. 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 the patient has had a vascular workup over 7 years ago and said  everything was normal at that stage. He does not have any chronic problems except for cardiac issues which he sees a cardiologist in [place]. 08/15/2017 -- arterial and venous duplex studies still pending. 08/23/2017 -- venous reflux studies done on 08/13/2017 shows venous incompetence throughout the left lower extremity deep system and focally at the left saphenofemoral junction. 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 No evidence of SVT or DVT in bilateral lower extremities The patient has an appointment at the end of the  month to get his arterial duplex study done 09/05/2017 -- the patient was seen at the vein and vascular office yesterday by Bary Castilla. ABI studies were notable for medial calcification and the toe brachial indices were normal and bilateral ankle-brachial) waveforms were normal with triphasic flow. After review of his venous studies he was not a candidate for laser ablation and his lymphedema was to be treated with compression stockings and lymphedema pump pumps 09/12/2017 -- had a low arterial study done at the [facility] -- unable to obtain reliable ABI is due to medial calcification. Bilateral toe indices were normal with the right being 1.01 and the left being 0.92 and the waveforms were triphasic bilaterally. he did get hold of 30-40 mm compression stockings but is unable to put these on. We will try and get him alternative compression stockings. 09/26/17- he is here in follow up evaluation of a right lower extremity ulcer;he is compliant in wearing compression stocking; ulcer almost epithelialized , anticipate healing next appointment Readmission: 11/17 point upon evaluation patient's wound currently that he is seeing [place] for today is a skin cancerous lesion that was cleared away by his dermatologist on the left medial calf region. He tells me that this is a very similar thing to what he had done previously in fact the last  time he saw him in 2018 this was also what was going on at that point. Nonetheless he feels that based on what he seeing currently that this is just having a lot of harder time healing although it is much closer to the surface than what he is experienced in the past. He notes that the initial removal was in June 2022 which was this year this is now November and still has not closed. He does have some edema and definitely I think that there is some venous component to his slow healing here. Also think that we can do something better than Vaseline to try to help with getting this to clear up as quickly as possible. He does have a history of atrial fibrillation and is on Eliquis otherwise he really has no major medical problems that would affect wound healing. 09/07/2021 upon evaluation today patient actually appears to be doing significantly better after having wrapped him last week. Overall I think that this is making significant improvements at this time which is great news. I do not see any evidence of infection which is great news as well. No fevers, chills, nausea, vomiting, or diarrhea. 09/14/2021 upon evaluation today patient appears to be doing well with regard to his leg ulcer. He has been tolerating the dressing changes and overall I think that he is making excellent progress. I do not see any signs of active infection at this time. KENDON, SEDENO [name] ([id number]) [id number] Page 6 of 9 09/21/2021 upon evaluation today patient actually appears to be making good progress with regard to his wound this is again measuring smaller today no debridement seems to be necessary. We have been using a silver collagen dressing and I think that is doing an awesome job. 09/28/2021 upon evaluation today patient appears to be doing well with regard to his leg currently. I do not see any signs of active infection at this time which is great news. No fevers, chills, nausea, vomiting, or  diarrhea. I think this wound is very close to complete resolution. 10/12/2021 upon evaluation today patient actually appears to be doing awesome in regard to his leg ulcer. In fact this appears  to be completely healed based on what I am seeing currently. I do not see any evidence of active infection locally nor systemically at this time which is also great news. No fevers, chills, nausea, vomiting, or diarrhea. Readmission: 12/07/2021 upon evaluation today patient presents for readmission here in the clinic. He was discharged on 10/12/2021 is completely healed. Unfortunately this has reopened at this point and he is having continual issues with new blisters over both lower extremities. This is even worse than what we previously saw. Nonetheless we did actually check his ABIs today and it did reveal that his ABIs were 0.55 on the left and 0.57 on the right. Subsequently this is a definite change from his last arterial study which showed that he did have good blood flow at 1.01 on the right and 0.92 on the left and that was right at the beginning of 2019. Nonetheless based on what we see currently I do think he tolerated the 3 layer compression wrap but I do believe that we probably need to get him tested for his arterial flow in order to see where things stand and if there is something we can do there that would help prevent this from continue to be an ongoing issue. He did not utilize compression socks in the interim from when he was last here till this time. That something is probably going to need lifelong going forward as well. 3/9; patient presents for follow-up. He has no issues or complaints today. He tolerated the compression wrap well. He had ABIs with TBI's done. He denies signs of infection. 12/21/2021 upon evaluation today patient appears to be doing well with regard to the wounds on his legs. Both are showing signs of significant improvement which is great news although I do believe some sharp  debridement would be of benefit here as well. 12/28/2021 upon evaluation today patient appears to be doing well with regard to his wounds. Everything is showing signs of excellent improvement which I am very pleased about. I think that we are headed in the right direction here. Fortunately there does not appear to be any evidence of infection which is great news there is a little bit of hypergranulation. 01/04/2022 upon evaluation today patient appears to be doing well with regard to his wounds 2 of them are healed 1 is almost so and the other 1 is significantly better. Overall I am extremely pleased with where we stand and I think that he is making excellent progress here. I do not see any evidence of active infection locally nor systemically at this time. 01-16-2022 upon evaluation today patient's wound on the left leg is showing signs of doing quite well. Has not completely cleared at this point but it is much improved. Fortunately I do not see any signs of infection at this time. No fevers, chills, nausea, vomiting, or diarrhea. 01-23-2022 upon evaluation today patient's wound of the left leg actually appears to be pretty much completely healed which is great news. I do not see any signs of active infection locally or systemically which is excellent. With that being said on the right leg what wound is measuring smaller the other 1 is a new wound that just showed up fortunately its not too bad. Has been using Xeroform here and that seems to be doing decently well which is great news. Unfortunately his blood pressure is significantly high we gave him the readings for the past 4-5 visits as well as a recommendation to make an appointment to  go discuss this with his primary care provider patient states that he is going to look into doing this. 01-30-2022 upon evaluation today patient appears to be doing well with regard to his left leg everything appears to be healed. On the right leg the more  anterior wound is healed the more medial wound that I been concerned about a possible skin cancer unfortunately still does not look great to me. I do believe that we should probably do a biopsy I have talked about it with him a few times I think though it is probably time to go ahead and do this at this point. 02-09-2022 upon evaluation today patient appears to be doing well with regard to his legs. On the left this appears to be completely healed. On the right he does have 2 areas and be perfectly honest one of them is a skin cancer that he is going to the Mohs surgery clinic for the other seems to be healing nicely. Readmission: 08-02-2022 upon evaluation today patient appears for reevaluation here in our clinic concerning issues that he has been having with wounds over the bilateral lower extremities. I last saw him in May 2023 and at that point we had him completely healed. Unfortunately he is tells me this has broken down to some degree since that point. 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 He tells me this 1 seems to just be getting bigger and bigger not improving. Fortunately he is not having any significant pain which is good news he does have quite a bit of swelling and he tells me that his fluid pills are not recommended for him to take daily but just in 3-day intervals here and there. 08-09-2022 upon evaluation today patient appears to be doing still somewhat poorly in regard to his legs although in general he does not appear to be feeling as good as he has been. Fortunately there does not appear to be any signs of infection which is good news. With that being said he is having some issues here with having and overall poor feeling in general which again is good I think going to be the  biggest complicating factor. He actually seems to be coughing I do not hear any wheezing right now I did listen to his chest he did not have good airflow down low however makes me suspicious for bronchitis or even possibly pneumonia which could be part of what is going on here as well. Fortunately I do not see any evidence of anything worsening in regard to his legs but I definitely believe that he needs to continue with the compression wraps he took them off yesterday to shower has not had anything on for 24 hours that is why his legs are so swollen today. With regard to his pathology report I did review that it showed some squamous abnormality but no signs of distinct carcinoma. With that being said it was saying that it could be adjacent to a squamous cell carcinoma nonetheless my suggestion is [name] be that we have the patient take copy of this report and give it to his Mohs surgeon in order for them to see if there is anything they feel like needs to be done further. With that being said right now I feel like  the primary thing is going to be for [place] to try to get his swelling down and keep that down into that hand since he is having so much drainage I believe we [name] have to bring him in for dressing changes twice a week doing a nurse visit on Mondays. 11/9; since the patient was last here he spent the night in the emergency room he received IV Lasix. Also received antibiotics although he was not discharged on either 1 of these. He also saw his cardiology office who put him on regular Lasix 20 mg [previously on as needed Lasix 20 mg]. Per our intake nurse the swelling in his legs is remarkably better but he still has bilateral lower extremity wounds. He still has wounds on the bilateral lower extremities most problematically on the left lateral calf. He has been using silver alginate under 3 layer compression. 08-23-2022 upon evaluation today patient appears to be doing much better than the last time  I saw him 2 weeks ago. At that point I was very concerned about how he was doing he did see Dr. Sherrie Mustache his primary care provider they got him on some blood pressure medication in general his color and overall appearance looks to be doing much improved compared to the last time I saw him. 09-04-2022 upon evaluation today patient appears to be doing well currently in regard to his wounds. Everything is showing signs of improvement which is great news. Fortunately there does not appear to be any signs of active infection locally or systemically at this time. No fevers, chills, nausea, vomiting, or diarrhea. 09-10-2022 upon evaluation today patient appears to be doing better in regard to his wounds although the [facility] was extremely stuck to the wound bed. Fortunately there does not appear to be any signs of infection locally or systemically at this time which is great news. No fevers, chills, nausea, vomiting, or diarrhea. [name], [name] ([id number]) [id number] Page 7 of 9 09-17-2022 upon evaluation today patient appears to be doing well currently in regard to his wounds in general. The right leg actually showing signs of excellent improvement and very pleased with where things stand in that regard. Fortunately I do not see any evidence of infection locally or systemically at this time which is great news. No fevers, chills, nausea, vomiting, or diarrhea. 09-24-2022 upon evaluation today patient appears to be doing well currently in regard to his wounds. Things look to be doing quite well. With that being said he did have a result unfortunately on the pathology which showed that he did have a squamous cell carcinoma noted on the biopsy sample I sent last week. He is seeing his dermatologist tomorrow in that regard. With that being said other than that however he seems to really be making some pretty good progress here which is good news. No fevers, chills, nausea,  vomiting, or diarrhea. 12/26; the patient has 2 open wounds remaining on the left leg. One is on the left anterior mid tibia and the other is on the right lateral knee just outside of the popliteal fossa. 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 The patient has been to see dermatology Dr. Adolphus Birchwood who apparently is making him a referral to the [facility] center. He does not yet have an appointment 10-12-2022 upon evaluation today patient appears to be doing well currently in regard to his wound. He has been tolerating the dressing changes without complication and overall feel like we  are headed in the right direction. Fortunately I do not see any signs of infection locally or systemically at this time which is great news. No fevers, chills, nausea, vomiting, or diarrhea. 10-23-2022 upon evaluation today patient appears to be doing well currently in regard to his wound. He has been tolerating the dressing changes without complication. Fortunately there does not appear to be any signs of active infection locally nor systemically which is great news and overall I am extremely pleased with where we stand currently. No fevers, chills, nausea, vomiting, or diarrhea. 10-26-2022 upon evaluation today patient appears to be doing well currently in regard to his wounds. Everything is showing signs of improvement and this is great news. Fortunately I see no evidence of active infection systemically. He does seem to be doing much better in regard to the local infection in regard to his leg. The smell is also greatly improved. Overall I am extremely happy with where we stand today. This is after just a few days with the antibiotic on board. 11-05-2022 upon evaluation today patient appears to be doing well currently in regard to his wounds although the wound where they performed the Mohs surgery does look a little bit hyper granulated I think switching to [facility]  may be better for him. He voiced understanding. Fortunately there does not appear to be any evidence of active infection locally nor systemically at this time. 11-12-2022 upon evaluation today patient appears to be doing better in regard to both wounds he has been tolerating the dressing changes without complication. There is no signs of infection and in general I think you are doing quite well. No fevers, chills, nausea, vomiting, or diarrhea. 11-20-2022 upon evaluation today patient appears to be doing well currently in regard to his wounds. He has been tolerating the dressing changes without complication. Fortunately there does not appear to be any signs of infection at this time. No fevers, chills, nausea, vomiting, or diarrhea. 11-27-2022 upon evaluation today patient appears to be doing somewhat poorly in regard to his leg in general he has a lot of areas where he looks like he had some spots that popped up. There with regard to new possible blisters. In general I am actually very concerned about the fact that the wrap may be causing some irritation here. I think that we [name] try to not do the wrap for 1 week, and given the prescription for mupirocin ointment which I will send into the pharmacy for him. 12-04-2022 upon evaluation today patient appears to be doing well currently in regard to his wounds in fact he appears to be pretty much completely healed based on what I am seeing at this point. I do not see any signs of active infection locally nor systemically at this time which is great news. No fevers, chills, nausea, vomiting, or diarrhea. 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 I discussed that with him today he is aware of the situation.-2024 upon evaluation patient presents for readmission here in the clinic actually last saw him  December 04, 2022. With that being said previously his dermatologist had asked that if there was something the need to be addressed from a dermatology standpoint and specifically biopsy that we make referral back to them to allow them to do it which I am definitely happy to do. 01-31-2023  upon evaluation today patient appears to be doing well currently in regard to his wound in fact this looks better he did see Dr. Adolphus Birchwood his dermatologist yesterday and they did perform a biopsy. With that being said he actually looks like he is doing much better Dr. Adolphus Birchwood feels like this may not be a cancerous lesion which will be very good news at the same time I definitely wanted to make sure especially considering his history and the way this looks when we saw him last week. Nonetheless I am extremely pleased with the fact that he is doing so much better at this point. Objective Constitutional Well-nourished and well-hydrated in no acute distress. Vitals Time Taken: 7:46 AM, Height: 75 in, Weight: 220 lbs, BMI: 27.5, Temperature: 98 F, Pulse: 49 bpm, Respiratory Rate: 18 breaths/min, Blood Pressure: 163/80 mmHg. Respiratory normal breathing without difficulty. Psychiatric this patient is able to make decisions and demonstrates good insight into disease process. Alert and Oriented x 3. pleasant and cooperative. General Notes: Upon inspection patient's wound bed actually showed signs of good granulation epithelization at this point. Fortunately I do not see any evidence of infection locally nor systemically which is great news and I think he is actually doing much better. If his biopsy indeed comes back negative then I think will be in good shape to continue with the wound measures as before which does seem to be doing excellent. Integumentary (Hair, Skin) Wound #20 status is Open. Original cause of wound was Gradually Appeared. The date acquired was: 12/24/2022. The wound has been in treatment 1 weeks. The  wound is located on the Left,Lateral Lower Leg. The wound measures 4.6cm length x 3cm width x 0.1cm depth; 10.838cm^2 area and 1.084cm^3 volume. There is Fat Layer (Subcutaneous Tissue) exposed. There is no tunneling or undermining noted. There is a large amount of serous drainage noted. The wound margin is thickened. There is no granulation within the wound bed. There is a large (67-100%) amount of necrotic tissue within the wound bed including Adherent Slough. 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 There was a Double Layer Compression Therapy Procedure by Huel Coventry, RN. Post procedure Diagnosis Wound #20: Same as Pre-Procedure Plan Follow-up Appointments: Return Appointment in 1 week. Nurse Visit as needed WOUND #20: - Lower Leg Wound Laterality: Left, Lateral Prim Dressing: Hydrofera Blue Ready Transfer Foam, 2.5x2.5 (in/in) ary Discharge Instructions: Apply Hydrofera Blue Ready to wound bed as directed Secondary Dressing: Zetuvit Plus 4x4 (in/in) Com pression Wrap: Urgo K2, two layer compression system, regular 1. Would recommend currently that we have the patient continue to monitor for any signs of infection or worsening. Based on what I am seeing I do believe that we are making good progress here. 2. I am good recommend as well we continue specifically with the [facility] and there were using the regular K2 Urgo compression wrap which is a equivalent of 30 to 40 mmHg. We will see patient back for  reevaluation in 1 week here in the clinic. If anything worsens or changes patient will contact our office for additional recommendations. Electronic Signature(s) Signed: 01/31/2023 5:44:59 PM By: Allen Derry  PA-C Entered By: Allen Derry on 01/31/2023 17:44:59 -------------------------------------------------------------------------------- SuperBill Details Patient Name: Date of Service: [name] 01/31/2023 Medical Record Number: [id number] Patient Account Number: [id number] Date of Birth/Sex: Treating RN: [birth year] (77 y.o. 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 L97.822 Non-pressure chronic ulcer of other part of left lower leg with fat layer exposed I73.89 Other specified peripheral vascular diseases I48.0 Paroxysmal atrial fibrillation I10 Essential (primary) hypertension [name], [name] ([id number]) [id number] Page 9 of 9 Facility Procedures : CPT4 Code: [id number] Description: (Facility Use Only) [phone] - APPLY MULTLAY COMPRS LWR LT LEG Modifier: Quantity: 1 Physician Procedures : CPT4 Code Description Modifier [id number] - WC PHYS LEVEL 3 - EST PT ICD-10 Diagnosis Description 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

## 2023-02-01 NOTE — Progress Notes (Signed)
[name], [name] ([id number]) [id number] Page 1 of 6 Visit Report for 01/31/2023 Arrival Information Details Patient Name: Date of Service: [name] 01/31/2023 7:45 [name] Medical Record Number: [id number] Patient Account Number: [id number] Date of Birth/Sex: Treating RN: [birth year] (77 y.o. [name] Primary Care Nycole Kawahara: [name] Other Clinician: Referring Railey Glad: Treating Benelli Winther/Extender: [name] in Treatment: 1 Visit Information History Since Last Visit Added or deleted any medications: No Patient Arrived: Ambulatory Has Compression in Place as Prescribed: Yes Arrival Time: 07:41 Pain Present Now: No Transfer Assistance: None Patient Identification Verified: Yes Secondary Verification Process Completed: Yes Patient Requires Transmission-Based Precautions: No Patient Has Alerts: Yes Patient Alerts: 12/2021 TBI [name])0.75 L)0.72 Electronic Signature(s) Signed: 01/31/2023 3:23:16 PM By: Elliot Gurney, BSN, RN, CWS, Kim RN, BSN Entered By: Elliot Gurney, BSN, RN, CWS, Kim on 01/31/2023 08:20:27 -------------------------------------------------------------------------------- Compression Therapy Details Patient Name: Date of Service: [name], [name] 01/31/2023 7:45 [name] Medical Record Number: [id number] Patient Account Number: [id number] Date of Birth/Sex: Treating RN: Jul 19, 1946 (77 y.o. [name] Primary Care Solenne Manwarren: [name] Other Clinician: Referring Jaiyden Laur: Treating Lyzette Reinhardt/Extender: [name] in Treatment: 1 Compression Therapy Performed for Wound Assessment: Wound #20 Left,Lateral Lower Leg Performed By: Clinician Huel Coventry, RN Compression Type: Double Layer Post Procedure Diagnosis Same as Pre-procedure Electronic Signature(s) Signed: 01/31/2023 3:23:16 PM By: Elliot Gurney, BSN, RN, CWS, Kim RN, BSN Entered By: Elliot Gurney, BSN, RN, CWS, Kim on 01/31/2023  08:17:55 -------------------------------------------------------------------------------- Encounter Discharge Information Details Patient Name: Date of Service: [name] 01/31/2023 7:45 [name] Medical Record Number: [id number] Patient Account Number: [id number] Date of Birth/Sex: Treating RN: 08-10-1946 (77 y.o. [name] Primary Care Erdem Naas: [name] Other Clinician: Referring Navaya Wiatrek: Treating Sophiamarie Nease/Extender: [name] in Treatment: 1 Encounter Discharge Information Items Discharge Condition: Stable Ambulatory Status: Ambulatory Discharge Destination: Home Transportation: Private Auto Schedule Follow-up Appointment: Yes Clinical Summary of Care: Electronic Signature(s) [name], [name] ([id number]) [id number] Page 2 of 6 Signed: 01/31/2023 3:23:16 PM By: Elliot Gurney, BSN, RN, CWS, Kim RN, BSN Entered By: Elliot Gurney, BSN, RN, CWS, Kim on 01/31/2023 08:45:04 -------------------------------------------------------------------------------- Lower Extremity Assessment Details Patient Name: Date of Service: [name], [name] 01/31/2023 7:45 [name] Medical Record Number: [id number] Patient Account Number: [id number] Date of Birth/Sex: Treating RN: Oct 16, 1945 (78 y.o. [name] Primary Care Perkins Molina: [name] Other Clinician: Referring Brier Reid: Treating Israella Hubert/Extender: [name] in Treatment: 1 Edema Assessment Assessed: Kyra Searles: No] Franne Forts: No] [Left: Edema] [Right: :] Calf Left: Right: Point of Measurement: 39 cm From Medial Instep 36.5 cm Ankle Left: Right: Point of Measurement: 11 cm From Medial Instep 27 cm Vascular Assessment Pulses: Dorsalis Pedis Palpable: [Left:Yes] Electronic Signature(s) Signed: 01/31/2023 3:23:16 PM By: Elliot Gurney, BSN, RN, CWS, Kim RN, BSN Entered By: Elliot Gurney, BSN, RN, CWS, Kim on 01/31/2023  07:57:30 -------------------------------------------------------------------------------- Multi Wound Chart Details Patient Name: Date of Service: [name] 01/31/2023 7:45 [name] Medical Record Number: [id number] Patient Account Number: [id number] Date of Birth/Sex: Treating RN: 1946/01/04 (77 y.o. [name] Primary Care Houda Brau: [name] Other Clinician: Referring Allyse Fregeau: Treating Zayde Stroupe/Extender: [name] in Treatment: 1 Vital Signs Height(in): 75 Pulse(bpm): 49 Weight(lbs): 220 Blood Pressure(mmHg): 163/80 Body Mass Index(BMI): 27.5 Temperature(F): 98 Respiratory Rate(breaths/min): 18 [20:Photos:] [[name]:[name]] Left, Lateral Lower Leg [name] Wound Location: Gradually Appeared [name] Wounding Event: Atypical [name] Primary Etiology: Arrhythmia,  Hypertension, Gout [name] Comorbid HistoryMarland Kitchen [name], [name] ([id number]) [id number] Page 3 of 6 12/24/2022 [name] Date Acquired: 1 [name] Weeks of Treatment: Open [name] Wound Status: No [name] Wound Recurrence: 4.6x3x0.1 [name] Measurements L x W x D (cm) 10.838 [name] [name] (cm) : rea 1.084 [name] Volume (cm) : 37.30% [name] % Reduction in Area: 37.30% [name] % Reduction in Volume: Full Thickness Without Exposed [name] Classification: Support Structures Large [name] Exudate Amount: Serous [name] Exudate Type: amber [name] Exudate Color: Thickened [name] Wound Margin: None Present (0%) [name] Granulation Amount: Large (67-100%) [name] Necrotic Amount: Fat Layer (Subcutaneous Tissue): Yes [name] Exposed Structures: Fascia: No Tendon: No Muscle: No Joint: No Bone: No None [name] Epithelialization: Compression Therapy [name] Procedures Performed: Treatment Notes Electronic Signature(s) Signed: 01/31/2023 3:23:16 PM By: Elliot Gurney, BSN, RN, CWS, Kim RN, BSN Entered By: Elliot Gurney, BSN, RN, CWS, Kim on 01/31/2023  08:43:26 -------------------------------------------------------------------------------- Multi-Disciplinary Care Plan Details Patient Name: Date of Service: [name], [name] 01/31/2023 7:45 [name] Medical Record Number: [id number] Patient Account Number: [id number] Date of Birth/Sex: Treating RN: 04/03/1946 (77 y.o. [name] Primary Care Brittny Spangle: [name] Other Clinician: Referring Wofford Stratton: Treating Kenslee Achorn/Extender: [name] in Treatment: 1 Active Inactive Malignancy/Atypical Etiology Nursing Diagnoses: Knowledge deficit related to disease process and management of atypical ulcer etiology Knowledge deficit related to disease process and management of malignancy Goals: Patient/caregiver will verbalize understanding of disease process and disease management of atypical ulcer etiology Date Initiated: 01/24/2023 Target Resolution Date: 02/22/2023 Goal Status: Active Patient/caregiver will verbalize understanding of disease process and disease management of malignancy Date Initiated: 01/24/2023 Target Resolution Date: 02/22/2023 Goal Status: Active Interventions: Assess patient and family medical history for signs and symptoms of malignancy/atypical etiology upon admission Provide education on atypical ulcer etiologies Provide education on malignant ulcerations Notes: Wound/Skin Impairment Nursing Diagnoses: Impaired tissue integrity Knowledge deficit related to smoking impact on wound healing Knowledge deficit related to ulceration/compromised skin integrity GoalsOWENN, [name] ([id number]) [phone].pdf Page 4 of 6 Ulcer/skin breakdown will have [name] volume reduction of 30% by week 4 Date Initiated: 01/24/2023 Target Resolution Date: 02/14/2023 Goal Status: Active Ulcer/skin breakdown will have [name] volume reduction of 50% by week 8 Date Initiated: 01/24/2023 Target Resolution Date: 03/14/2023 Goal Status: Active Ulcer/skin  breakdown will have [name] volume reduction of 80% by week 12 Date Initiated: 01/24/2023 Target Resolution Date: 04/11/2023 Goal Status: Active Ulcer/skin breakdown will heal within 14 weeks Date Initiated: 01/24/2023 Target Resolution Date: 04/25/2023 Goal Status: Active Interventions: Assess patient/caregiver ability to obtain necessary supplies Assess patient/caregiver ability to perform ulcer/skin care regimen upon admission and as needed Assess ulceration(s) every visit Provide education on ulcer and skin care Treatment Activities: Referred to DME Roderica Cathell for dressing supplies : 01/24/2023 Skin care regimen initiated : 01/24/2023 Topical wound management initiated : 01/24/2023 Notes: Electronic Signature(s) Signed: 01/31/2023 3:23:16 PM By: Elliot Gurney, BSN, RN, CWS, Kim RN, BSN Entered By: Elliot Gurney, BSN, RN, CWS, Kim on 01/31/2023 08:44:12 -------------------------------------------------------------------------------- Pain Assessment Details Patient Name: Date of Service: [name], [name] 01/31/2023 7:45 [name] Medical Record Number: [id number] Patient Account Number: [id number] Date of Birth/Sex: Treating RN: 1945/10/20 (77 y.o. [name] Primary Care Berit Raczkowski: [name] Other Clinician: Referring Brettney Ficken: Treating Ottie Tillery/Extender: [name] in Treatment: 1 Active Problems Location of Pain Severity and Description of Pain Patient Has Paino No  Site Locations Pain Management and Medication Current Pain Management: Electronic Signature(s) Signed: 01/31/2023 3:23:16 PM By: Elliot Gurney, BSN, RN, CWS, Kim RN, BSN Entered By: Elliot Gurney, BSN, RN, CWS, Kim on 01/31/2023 07:48:10 [name], [name] ([id number]) [id number] Page 5 of 6 -------------------------------------------------------------------------------- Patient/Caregiver Education Details Patient Name: Date of Service: [name] 4/25/2024andnbsp7:45 [name] Medical Record Number:  [id number] Patient Account Number: [id number] Date of Birth/Gender: Treating RN: 04/04/1946 (77 y.o. [name] Primary Care Physician: [name] Other Clinician: Referring Physician: Treating Physician/Extender: [name] in Treatment: 1 Education Assessment Education Provided To: Patient Education Topics Provided Venous: Handouts: Controlling Swelling with Multilayered Compression Wraps, Other: do not get wet Methods: Explain/Verbal Responses: State content correctly Electronic Signature(s) Signed: 01/31/2023 3:23:16 PM By: Elliot Gurney, BSN, RN, CWS, Kim RN, BSN Entered By: Elliot Gurney, BSN, RN, CWS, Kim on 01/31/2023 08:44:33 -------------------------------------------------------------------------------- Wound Assessment Details Patient Name: Date of Service: [name], [name] 01/31/2023 7:45 [name] Medical Record Number: [id number] Patient Account Number: [id number] Date of Birth/Sex: Treating RN: 10-06-1946 (77 y.o. Loel Lofty, Selena Batten Primary Care Granville Whitefield: [name] Other Clinician: Referring Clare Fennimore: Treating Ariane Ditullio/Extender: [name] in Treatment: 1 Wound Status Wound Number: 20 Primary Etiology: Atypical Wound Location: Left, Lateral Lower Leg Wound Status: Open Wounding Event: Gradually Appeared Comorbid History: Arrhythmia, Hypertension, Gout Date Acquired: 12/24/2022 Weeks Of Treatment: 1 Clustered Wound: No Photos Wound Measurements Length: (cm) 4.6 Width: (cm) 3 Depth: (cm) 0.1 Area: (cm) 10.838 Volume: (cm) 1.084 % Reduction in Area: 37.3% % Reduction in Volume: 37.3% Epithelialization: None Tunneling: No Undermining: No Wound Description Classification: Full Thickness Without Exposed Support Structures Wound Margin: Thickened [name], [name] Exudate Amount: Large Exudate Type: Serous Exudate Color: amber Foul Odor After Cleansing: No Slough/Fibrino  Yes [phone].pdf Page 6 of 6 Wound Bed Granulation Amount: None Present (0%) Exposed Structure Necrotic Amount: Large (67-100%) Fascia Exposed: No Necrotic Quality: Adherent Slough Fat Layer (Subcutaneous Tissue) Exposed: Yes Tendon Exposed: No Muscle Exposed: No Joint Exposed: No Bone Exposed: No Treatment Notes Wound #20 (Lower Leg) Wound Laterality: Left, Lateral Cleanser Peri-Wound Care Topical Primary Dressing Hydrofera Blue Ready Transfer Foam, 2.5x2.5 (in/in) Discharge Instruction: Apply Hydrofera Blue Ready to wound bed as directed Secondary Dressing Zetuvit Plus 4x4 (in/in) Secured With Compression Wrap Urgo K2, two layer compression system, regular Compression Stockings Add-Ons Electronic Signature(s) Signed: 01/31/2023 3:23:16 PM By: Elliot Gurney, BSN, RN, CWS, Kim RN, BSN Entered By: Elliot Gurney, BSN, RN, CWS, Kim on 01/31/2023 07:56:21 -------------------------------------------------------------------------------- Vitals Details Patient Name: Date of Service: [name], [name] 01/31/2023 7:45 [name] Medical Record Number: [id number] Patient Account Number: [id number] Date of Birth/Sex: Treating RN: May 10, 1946 (77 y.o. [name] Primary Care Relena Ivancic: [name] Other Clinician: Referring Carlton Sweaney: Treating Cason Luffman/Extender: [name] in Treatment: 1 Vital Signs Time Taken: 07:46 Temperature (F): 98 Height (in): 75 Pulse (bpm): 49 Weight (lbs): 220 Respiratory Rate (breaths/min): 18 Body Mass Index (BMI): 27.5 Blood Pressure (mmHg): 163/80 Reference Range: 80 - 120 mg / dl Electronic Signature(s) Signed: 01/31/2023 3:23:16 PM By: Elliot Gurney, BSN, RN, CWS, Kim RN, BSN Entered By: Elliot Gurney, BSN, RN, CWS, Kim on 01/31/2023 07:47:56

## 2023-02-04 ENCOUNTER — Encounter: Payer: Self-pay | Admitting: Cardiology

## 2023-02-04 ENCOUNTER — Ambulatory Visit: Payer: Medicare PPO | Attending: Cardiology

## 2023-02-04 ENCOUNTER — Ambulatory Visit: Payer: Medicare PPO | Attending: Cardiology | Admitting: Cardiology

## 2023-02-04 ENCOUNTER — Telehealth: Payer: Self-pay | Admitting: *Deleted

## 2023-02-04 VITALS — BP 142/63 | HR 42 | Ht 75.0 in | Wt 234.2 lb

## 2023-02-04 DIAGNOSIS — I10 Essential (primary) hypertension: Secondary | ICD-10-CM | POA: Diagnosis not present

## 2023-02-04 DIAGNOSIS — E785 Hyperlipidemia, unspecified: Secondary | ICD-10-CM

## 2023-02-04 DIAGNOSIS — I4821 Permanent atrial fibrillation: Secondary | ICD-10-CM

## 2023-02-04 DIAGNOSIS — D689 Coagulation defect, unspecified: Secondary | ICD-10-CM | POA: Diagnosis not present

## 2023-02-04 DIAGNOSIS — Z7901 Long term (current) use of anticoagulants: Secondary | ICD-10-CM

## 2023-02-04 DIAGNOSIS — R001 Bradycardia, unspecified: Secondary | ICD-10-CM

## 2023-02-04 DIAGNOSIS — I428 Other cardiomyopathies: Secondary | ICD-10-CM | POA: Diagnosis not present

## 2023-02-04 DIAGNOSIS — I739 Peripheral vascular disease, unspecified: Secondary | ICD-10-CM

## 2023-02-04 DIAGNOSIS — I872 Venous insufficiency (chronic) (peripheral): Secondary | ICD-10-CM

## 2023-02-04 MED ORDER — VALSARTAN 320 MG PO TABS: 320.0000 mg | ORAL_TABLET | Freq: Every day | ORAL | 3 refills | Status: DC

## 2023-02-04 MED ORDER — CARVEDILOL 6.25 MG PO TABS: 6.2500 mg | ORAL_TABLET | Freq: Two times a day (BID) | ORAL | 3 refills | Status: DC

## 2023-02-04 NOTE — Patient Instructions (Addendum)
Medication Instructions:    Decrease Carvedilol  to 6.25 mg ( 1/2 tablet of 12.5 mg)   twice a day   Increase valsartan to 320 mg daily   ( you can take 2 tablet of 160 mg until bottle is empty )   *If you need a refill on your cardiac medications before your next appointment, please call your pharmacy*   Lab Work:  BMP in 2 weeks   If you have labs (blood work) drawn today and your tests are completely normal, you will receive your results only by: MyChart Message (if you have MyChart) OR A paper copy in the mail If you have any lab test that is abnormal or we need to change your treatment, we will call you to review the results.   Testing/Procedures:  Office will call you to schedule a time to have monitor to place on your chest.   Your physician has recommended that you wear a holter monitor. Holter monitors are medical devices that record the heart's electrical activity. Doctors most often use these monitors to diagnose arrhythmias. Arrhythmias are problems with the speed or rhythm of the heartbeat. The monitor is a small, portable device. You can wear one while you do your normal daily activities. This is usually used to diagnose what is causing palpitations/syncope (passing out).    Follow-Up: At [facility], you and your health needs are our priority.  As part of our continuing mission to provide you with exceptional heart care, we have created designated Provider Care Teams.  These Care Teams include your primary Cardiologist (physician) and Advanced Practice Providers (APPs -  Physician Assistants and Nurse Practitioners) who all work together to provide you with the care you need, when you need it.     Your next appointment:   8 to 9 month(s)  The format for your next appointment:   In Person  Provider:   Bryan Lemma, MD    Other Instructions  ZIO XT- Long Term Monitor Instructions  Your physician has requested you wear a ZIO patch monitor for 3 days.  This  is a single patch monitor. [employer] supplies one patch monitor per enrollment. Additional stickers are not available. Please do not apply patch if you will be having a Nuclear Stress Test,  Echocardiogram, Cardiac CT, MRI, or Chest Xray during the period you would be wearing the  monitor. The patch cannot be worn during these tests. You cannot remove and re-apply the  ZIO XT patch monitor.  Your ZIO patch monitor will be mailed 3 day USPS to your address on file. It may take 3-5 days  to receive your monitor after you have been enrolled.  Once you have received your monitor, please review the enclosed instructions. Your monitor  has already been registered assigning a specific monitor serial # to you.  Billing and Patient Assistance Program Information  We have supplied [employer] with any of your insurance information on file for billing purposes. [employer] offers a sliding scale Patient Assistance Program for patients that do not have  insurance, or whose insurance does not completely cover the cost of the ZIO monitor.  You must apply for the Patient Assistance Program to qualify for this discounted rate.  To apply, please call [employer] at [phone], select option 4, select option 2, ask to apply for  Patient Assistance Program. Meredeth Ide will ask your household income, and how many people  are in your household. They will quote your out-of-pocket cost based on that information.  [employer] will also be able to set up a 101-month, interest-free payment plan if needed.  Applying the monitor   Shave hair from upper left chest.  Hold abrader disc by orange tab. Rub abrader in 40 strokes over the upper left chest as  indicated in your monitor instructions.  Clean area with 4 enclosed alcohol pads. Let dry.  Apply patch as indicated in monitor instructions. Patch will be placed under collarbone on left  side of chest with arrow pointing upward.  Rub patch adhesive wings for 2 minutes. Remove white  label marked "1". Remove the white  label marked "2". Rub patch adhesive wings for 2 additional minutes.  While looking in a mirror, press and release button in center of patch. A small green light will  flash 3-4 times. This will be your only indicator that the monitor has been turned on.  Do not shower for the first 24 hours. You may shower after the first 24 hours.  Press the button if you feel a symptom. You will hear a small click. Record Date, Time and  Symptom in the Patient Logbook.  When you are ready to remove the patch, follow instructions on the last 2 pages of Patient  Logbook. Stick patch monitor onto the last page of Patient Logbook.  Place Patient Logbook in the blue and white box. Use locking tab on box and tape box closed  securely. The blue and white box has prepaid postage on it. Please place it in the mailbox as  soon as possible. Your physician should have your test results approximately 7 days after the  monitor has been mailed back to [facility].  Call [facility] Customer Care at [phone] if you have questions regarding  your ZIO XT patch monitor. Call them immediately if you see an orange light blinking on your  monitor.  If your monitor falls off in less than 4 days, contact our Monitor department at [phone].  If your monitor becomes loose or falls off after 4 days call [employer] at [phone] for  suggestions on securing your monitor

## 2023-02-04 NOTE — Progress Notes (Signed)
Primary Care Provider: Malva Limes, MD [facility] Cardiologist: Bryan Lemma, MD Electrophysiologist: None  Clinic Note: Chief Complaint  Patient presents with   84-month follow-up    Feeling much better.  Thought processes are also clear.   Atrial Fibrillation    No sensation of being A-fib.   ===================================  ASSESSMENT/PLAN   Problem List Items Addressed This Visit       Cardiology Problems   Permanent atrial fibrillation (HCC) CHA2DS2-VASc Score 3; On Xarelto (Chronic)    Pretty much asymptomatic with the actual A-fib but he is truly bradycardic.  I am a little concerned that he may actually have chronotropic competence and/or prolonged pauses or worsening bradycardia and that may be consistent with sick sinus syndrome.  Certainly not tachybradycardia.  Plan: Check 3-day Zio patch monitor. Due to significant bradycardia, will reduce carvedilol dose in half and increase valsartan to double. Doing fairly well on Xarelto.  No major issues.      Relevant Medications   valsartan (DIOVAN) 320 MG tablet   carvedilol (COREG) 6.25 MG tablet   Other Relevant Orders   EKG 12-Lead (Completed)   LONG TERM MONITOR (3-14 DAYS)   Peripheral vascular disease (HCC) (Chronic)    Seen by Dr. Lorine Bears, plans for medical management.  Treating cardiovascular risk factors-hypertension and hyperlipidemia.  Continue to recommend walking. No plans for invasive options at this time.      Relevant Medications   valsartan (DIOVAN) 320 MG tablet   carvedilol (COREG) 6.25 MG tablet   NICM (nonischemic cardiomyopathy) (HCC) (Chronic)    EF back to 50 to 55% on recent echo.  Not really having true CHF symptoms.  He is on carvedilol and Diovan I am going to reduce carvedilol to 6.25 mg twice daily and increase valsartan to 320 mg daily. His standing dose of furosemide 20 mg is not requiring any additional doses.      Relevant Medications    valsartan (DIOVAN) 320 MG tablet   carvedilol (COREG) 6.25 MG tablet   Other Relevant Orders   Basic metabolic panel   Hyperlipidemia with target LDL less than 100 (Chronic)    Lipids look well-controlled on 10 mg rosuvastatin. No change      Relevant Medications   valsartan (DIOVAN) 320 MG tablet   carvedilol (COREG) 6.25 MG tablet   Essential hypertension - Primary (Chronic)    He is profoundly bradycardic as well.  Plan: Reduce carvedilol to 6.25 mg twice daily and increase Diovan to 320 mg daily. On chlorthalidone as well as Lasix. Marland Kitchen He is not really using the Lasix that frequently, would potentially consider switching to spironolactone from furosemide.      Relevant Medications   valsartan (DIOVAN) 320 MG tablet   carvedilol (COREG) 6.25 MG tablet   Other Relevant Orders   Basic metabolic panel     Other   Venous stasis dermatitis (Chronic)    Probably as much related to poor vein drainage as anything else.  He does have some varicose veins and erythema from her venous stasis.  Continue to elevate feet when possible, wear compression stockings.   Keep walking.      Long term current use of anticoagulant therapy (Chronic)   Relevant Orders   Basic metabolic panel   Bradycardia with 41-50 beats per minute    Some sick sinus syndrome underlying is A-fib, but heart racing slow today.  Need to back down beta-blocker-reduce carvedilol to 6.25 mg twice daily.  3-day Zio patch monitor to assess heart rate responsiveness.      Relevant Orders   EKG 12-Lead (Completed)   LONG TERM MONITOR (3-14 DAYS)   Acquired coagulation disorder (HCC) (Chronic)    On DOAC for A-fib -tolerating Xarelto well.  No bleeding issues.  This patients CHA2DS2-VASc Score and unadjusted Ischemic Stroke Rate (% per year) is equal to 3.2 % stroke rate/year from a score of 3  Above score calculated as 1 point each if present [CHF, HTN, DM, Vascular=MI/PAD/Aortic Plaque, Age if 65-74, or  Male] Above score calculated as 2 points each if present [Age > 75, or Stroke/TIA/TE]        ===================================  HPI:    [name] is a 77 y.o. male with a PMH with Permanent A-Fib (rate controlled) and history of NICM (EF back to 50-55%) who presents today for ~4 month f/u.  Today he is not accompanied by his son.  He returns here today at the request of Malva Limes, MD.  I last saw [name] on December 20th 2023 for second post hospital follow-up.  He was having a lot of memory issues, was not very clear on his history at that time.  Was not sure what medicines he was taking.  His son was helping him out.  He probably had not been on the correct medications when he went to the ER, but apparently was back on meds and was better controlled.  BP better controlled.  As well as swelling.  Chronic leg wounds from venous stasis were healing, and it is completed a course of doxycycline. => Pretty much asymptomatic from a cardiac standpoint with no CHF symptoms of PND, orthopnea to go along with edema.  Not really noting exertional dyspnea but was quite debilitated and was noting that he would get short of breath going up stairs or incline.  Not associate with chest pain or pressure.  Had no real sense of being in A-fib.  Able to do household chores but not able to go as long as he had been without stopping.  I referred him to Dr. Kirke Corin for possible claudication symptoms (after ABIs showed potentially significant disease).  He  He was just seen by Dr. Kirke Corin on December 19 in follow-up for noninvasive testing showed noncompressible vessels bilaterally but normal TBI's.  No significant disease in the common femoral and profunda as well as SFA popliteal arteries.  One-vessel runoff below the knee on the right side and 2 on the left.  No indication for attempted revascularization.   Reviewed  CV studies:    The following studies were reviewed today: (if available,  images/films reviewed: From Epic Chart or Care Everywhere) TTE 09/11/2022: EF 50 to 55%.  Low normal but no RWMA.Unable assess DFxn; moderately elevated PAP with RVSP estimated 48 mmHg.  Both atria are severely dilated.  RAP estimated 3 mmHg.  Stable.  Aortic and mitral valves both normal.  Only mild MR.   Interval History:   [name] presents here today notably feeling better.  He is says that his energy level is definitely improving.  His swelling is significant improved, and the left leg wounds dressings are still in place but right leg much much better.  He does note that he will get short of breath walking up steps, but not with routine activity or walking.  Does not seem like he has fatigue and has no sense of his heart rate being 40 beats a minute.  No real exertional dyspnea with routine activity.  No chest pain or pressure with rest or exertion.  No real exertional dyspnea beyond baseline.Marland Kitchen He also is not aware of being in or out of A-fib.  No real heart failure or angina symptoms.  Cardiovascular ROS: positive for - dyspnea on exertion, edema, and Swelling is better, exertional dyspnea notably improved (back to baseline) less fatigue. negative for - chest pain, murmur, orthopnea, palpitations, paroxysmal nocturnal dyspnea, rapid heart rate, shortness of breath, or syncope/near syncope or TIA/amaurosis fugax, claudication   REVIEWED OF SYSTEMS   Review of Systems  Constitutional:  Positive for malaise/fatigue (Slowing down some, not as much energy as he used to have but not really fatigue.  Just tires out easier with chores.  Still does his chores.). Negative for weight loss (Weight is actually up -> not sure if the January reading was correct.).  HENT:  Negative for congestion and nosebleeds.   Respiratory:  Positive for shortness of breath (Only with exertion). Negative for cough and wheezing.   Cardiovascular:  Positive for leg swelling (A little).       HPI  Gastrointestinal:   Negative for abdominal pain, blood in stool and melena.  Genitourinary:  Negative for hematuria.  Musculoskeletal:  Positive for joint pain. Negative for falls and myalgias.  Neurological:  Positive for dizziness (Some vertigo). Negative for focal weakness, weakness and headaches.  Endo/Heme/Allergies:  Positive for environmental allergies. Bruises/bleeds easily.  Psychiatric/Behavioral:  Positive for memory loss. Negative for depression. The patient is not nervous/anxious and does not have insomnia.   All other systems reviewed and are negative.  I have reviewed and (if needed) personally updated the patient's problem list, medications, allergies, past medical and surgical history, social and family history.   PAST MEDICAL HISTORY   Past Medical History:  Diagnosis Date   Basal cell carcinoma of right lower extremity 03/24/2015   BPH with obstruction/lower urinary tract symptoms    Chronic atrial fibrillation (HCC)    Chronic heart failure with preserved ejection fraction (HFpEF) (HCC)    Dislocation of shoulder, anterior, right, closed 07/24/2014   Echocardiogram abnormal    2009 moderate to severely dilated left atrium   Erectile dysfunction    Essential hypertension    Gout    History of chicken pox    History of measles    Hyperglycemia    Hypogonadism in male    Nocturia    Peyronie's disease    Traumatic tear of right rotator cuff 07/24/2014   Venous stasis    chronic    PAST SURGICAL HISTORY   Past Surgical History:  Procedure Laterality Date   CARDIAC CATHETERIZATION  03/28/2004   normal coronaries, reduced EF at 25-30% (Dr. Laurell Josephs)   CARDIOVERSION, TEE guided  03/29/2004   Dr. Laurell Josephs    COLONOSCOPY  05/28/2013   Dr. 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 Carmon Ginsberg. [place]   TRANSTHORACIC ECHOCARDIOGRAM  09/11/2022   LV EF 50 to 55%.  Normal function with no RWMA.  Unable to assess DD - Afib.  Mildly reduced RV function moderately enlarged RV and moderately elevated PAP.  Severe biatrial enlargement.  Mild MR.  Normal RAP.:    MEDICATIONS/ALLERGIES   Current Meds  Medication Sig   carvedilol (COREG) 12.5 MG tablet TAKE 1 TABLET BY MOUTH TWICE A DAY   chlorthalidone (HYGROTON) 25 MG tablet Take 1 tablet (25 mg total) by mouth daily.   furosemide (LASIX) 20 MG tablet Take 1 tablet (20 mg total) by mouth daily.   levothyroxine (SYNTHROID) 50 MCG tablet TAKE ONE TABLET BY MOUTH DAILY   Multiple Vitamin (MULTIVITAMIN) tablet Take 1 tablet by mouth daily.   potassium chloride SA (KLOR-CON M) 20 MEQ tablet Take 2 tablets (40 mEq total) by mouth daily.   rivaroxaban (XARELTO) 20 MG TABS tablet Take 1 tablet (20 mg total) by mouth daily with supper.   rosuvastatin (CRESTOR) 10 MG tablet Take 1 tablet (10 mg total) by mouth daily.   valsartan (DIOVAN) 160 MG tablet Take 1 tablet (160 mg total) by mouth daily.   Allergies  Allergen Reactions   Benazepril     COUGH     SOCIAL HISTORY/FAMILY HISTORY   Reviewed in Epic:  Pertinent findings:  Social History   Tobacco Use   Smoking status: Never   Smokeless tobacco: Never  Vaping Use   Vaping Use: Never used  Substance Use Topics   Alcohol use: Yes    Alcohol/week: 4.0 standard drinks of alcohol    Types: 4 Cans of beer per week   Drug use: No   Social History   Social  History Narrative   Divorced father of 1. Retired Engineer, site.   ~2 beers / day. Does not smoke.   Works out 6 days a weeks - cardio & weights. He used to run and play basketball prior to his hip surgery in 2007.   Currently dating.    OBJCTIVE -PE, EKG, labs   Wt Readings from Last 3 Encounters:  02/04/23 234 lb 3.2 oz (106.2 kg)  10/19/22 220 lb (99.8 kg)  09/26/22 235 lb 3.2 oz (106.7 kg)    Physical Exam: BP (!) 142/63   Pulse (!) 42   Ht 6\' 3"  (1.905 m)   Wt 234 lb 3.2 oz (106.2 kg)   SpO2 99%   BMI 29.27 kg/m  Physical Exam Vitals reviewed.  Constitutional:      General: He is not in acute distress.    Appearance: Normal appearance. He is obese. He is not ill-appearing or toxic-appearing.     Comments: Borderline obese but otherwise well-groomed.  Well-nourished.  HENT:     Head: Normocephalic and atraumatic.  Neck:     Vascular: No carotid bruit or JVD.  Cardiovascular:     Rate and Rhythm: Bradycardia present. Rhythm irregularly irregular.     Chest Wall: PMI is not displaced.     Pulses: Intact distal pulses.     Heart sounds: S1 normal and S2 normal. Heart sounds are distant. No murmur heard.    No friction rub. No gallop.  Pulmonary:     Effort: Pulmonary effort is normal. No respiratory distress.     Breath sounds: Normal breath sounds. No wheezing, rhonchi or rales.  Chest:  Chest wall: No tenderness.  Musculoskeletal:        General: Swelling (Trivial swelling.  Support stocking with dressing in place.) present.     Cervical back: Normal range of motion and neck supple.  Skin:    General: Skin is warm and dry.     Findings: Erythema (Bilateral (left more than right venous stasis changes.  No rubor or calor.) present.     Comments: Mild stasis changes  Neurological:     General: No focal deficit present.     Mental Status: He is alert and oriented to person, place, and time. Mental status is at baseline.     Motor: No weakness.     Gait: Gait  abnormal.     Comments: Notably less coherent.  More unclear historian.  Psychiatric:        Mood and Affect: Mood normal.        Behavior: Behavior normal.        Thought Content: Thought content normal.        Judgment: Judgment normal.     Comments: Much clearer today.  More back to baseline.     Adult ECG Report  Rate: 42;  Rhythm: atrial fibrillation and slow ventricular response with ? Junctional escape beats ;  Nonspecific IVCD.  Nonspecific ST and T wave changes  Narrative Interpretation: Stable  Recent Labs: REVIEWED  Lab Results  Component Value Date   CHOL 130 12/13/2022   HDL 51 12/13/2022   LDLCALC 66 12/13/2022   TRIG 60 12/13/2022   CHOLHDL 2.5 12/13/2022   Lab Results  Component Value Date   CREATININE 0.97 12/13/2022   BUN 10 12/13/2022   NA 140 12/13/2022   K 4.5 12/13/2022   CL 100 12/13/2022   CO2 26 12/13/2022      Latest Ref Rng & Units 09/24/2022    9:37 AM 08/09/2022    7:15 PM 11/22/2021   11:50 AM  CBC  WBC 3.4 - 10.8 x10E3/uL 6.1  8.3  7.7   Hemoglobin 13.0 - 17.7 g/dL 16.1  09.6  04.5   Hematocrit 37.5 - 51.0 % 37.8  39.3  40.5   Platelets 150 - 450 x10E3/uL 313  285  266     Lab Results  Component Value Date   HGBA1C 6.2 (H) 09/24/2022   Lab Results  Component Value Date   TSH 6.520 (H) 09/24/2022    ================================================== I spent a total of 45 minutes with the patient spent in direct patient consultation.  Additional time spent with chart review  / charting (studies, outside notes, etc): 17 min Total Time: 62 min  Current medicines are reviewed at length with the patient today.  (+/- concerns) n/a  Notice: This dictation was prepared with Dragon dictation along with smart phrase technology. Any transcriptional errors that result from this process are unintentional and may not be corrected upon review.  Studies Ordered:   Orders Placed This Encounter  Procedures   Basic metabolic panel   LONG  TERM MONITOR (3-14 DAYS)   EKG 12-Lead   Meds ordered this encounter  Medications   valsartan (DIOVAN) 320 MG tablet    Sig: Take 1 tablet (320 mg total) by mouth daily.    Dispense:  90 tablet    Refill:  3    Discontinue 160 mg   carvedilol (COREG) 6.25 MG tablet    Sig: Take 1 tablet (6.25 mg total) by mouth 2 (two) times daily.    Dispense:  180 tablet    Refill:  3    Discontinue 12.5 mg dose    Patient Instructions / Medication Changes & Studies & Tests Ordered   Patient Instructions  Medication Instructions:    Decrease Carvedilol  to 6.25 mg ( 1/2 tablet of 12.5 mg)   twice a day   Increase valsartan to 320 mg daily   ( you can take 2 tablet of 160 mg until bottle is empty )   *If you need a refill on your cardiac medications before your next appointment, please call your pharmacy*   Lab Work:  BMP in 2 weeks   If you have labs (blood work) drawn today and your tests are completely normal, you will receive your results only by: MyChart Message (if you have MyChart) OR A paper copy in the mail If you have any lab test that is abnormal or we need to change your treatment, we will call you to review the results.   Testing/Procedures:  Office will call you to schedule a time to have monitor to place on your chest.   Your physician has recommended that you wear a holter monitor. Holter monitors are medical devices that record the heart's electrical activity. Doctors most often use these monitors to diagnose arrhythmias. Arrhythmias are problems with the speed or rhythm of the heartbeat. The monitor is a small, portable device. You can wear one while you do your normal daily activities. This is usually used to diagnose what is causing palpitations/syncope (passing out).    Follow-Up: At [facility], you and your health needs are our priority.  As part of our continuing mission to provide you with exceptional heart care, we have created designated Provider Care Teams.   These Care Teams include your primary Cardiologist (physician) and Advanced Practice Providers (APPs -  Physician Assistants and Nurse Practitioners) who all work together to provide you with the care you need, when you need it.     Your next appointment:   8 to 9 month(s)  The format for your next appointment:   In Person  Provider:   Bryan Lemma, MD    Other Instructions  ZIO XT- Long Term Monitor Instructions  Your physician has requested you wear a ZIO patch monitor for 3 days.      Marykay Lex, MD, MS Bryan Lemma, M.D., M.S. Interventional Cardiologist  [facility]  Pager # [phone] Phone # [phone] [address] [place], [place] [address]   Thank you for choosing [facility] [place]!!

## 2023-02-04 NOTE — Telephone Encounter (Signed)
Patient called and offered appointment to have a ZIO XT monitor applied at our [facility] office.  Patient declined, stating he would prefer to have monitor sent to his house.  Instructed to call us if he needs assistance once he has received his monitor.

## 2023-02-04 NOTE — Progress Notes (Unsigned)
Enrolled for [employer] to mail a ZIO XT long term holter monitor to the patients address on file per patients request.

## 2023-02-06 ENCOUNTER — Encounter: Payer: Self-pay | Admitting: Cardiology

## 2023-02-07 ENCOUNTER — Other Ambulatory Visit
Admission: RE | Admit: 2023-02-07 | Discharge: 2023-02-07 | Disposition: A | Discharge status: Home / Self Care | Payer: Medicare PPO | Source: Ambulatory Visit | Attending: Physician Assistant | Admitting: Physician Assistant

## 2023-02-07 ENCOUNTER — Encounter: Payer: Medicare PPO | Attending: Physician Assistant | Admitting: Physician Assistant

## 2023-02-07 DIAGNOSIS — I739 Peripheral vascular disease, unspecified: Secondary | ICD-10-CM | POA: Diagnosis not present

## 2023-02-07 DIAGNOSIS — I428 Other cardiomyopathies: Secondary | ICD-10-CM | POA: Diagnosis not present

## 2023-02-07 DIAGNOSIS — R001 Bradycardia, unspecified: Secondary | ICD-10-CM | POA: Diagnosis not present

## 2023-02-07 DIAGNOSIS — I4821 Permanent atrial fibrillation: Secondary | ICD-10-CM

## 2023-02-07 DIAGNOSIS — Z85828 Personal history of other malignant neoplasm of skin: Secondary | ICD-10-CM | POA: Insufficient documentation

## 2023-02-07 DIAGNOSIS — L97822 Non-pressure chronic ulcer of other part of left lower leg with fat layer exposed: Secondary | ICD-10-CM | POA: Diagnosis not present

## 2023-02-07 DIAGNOSIS — I87333 Chronic venous hypertension (idiopathic) with ulcer and inflammation of bilateral lower extremity: Secondary | ICD-10-CM | POA: Diagnosis not present

## 2023-02-07 DIAGNOSIS — I10 Essential (primary) hypertension: Secondary | ICD-10-CM | POA: Insufficient documentation

## 2023-02-07 DIAGNOSIS — B999 Unspecified infectious disease: Secondary | ICD-10-CM | POA: Insufficient documentation

## 2023-02-07 DIAGNOSIS — Z7901 Long term (current) use of anticoagulants: Secondary | ICD-10-CM | POA: Diagnosis not present

## 2023-02-07 DIAGNOSIS — I480 Paroxysmal atrial fibrillation: Secondary | ICD-10-CM | POA: Diagnosis not present

## 2023-02-07 DIAGNOSIS — I872 Venous insufficiency (chronic) (peripheral): Secondary | ICD-10-CM | POA: Insufficient documentation

## 2023-02-08 NOTE — Progress Notes (Signed)
[name], [name] ([id number]) [id number] Page 1 of 9 Visit Report for 02/07/2023 Chief Complaint Document Details Patient Name: Date of Service: [name] [name] 02/07/2023 8:30 [name] M Medical Record Number: [id number] Patient Account Number: [id number] Date of Birth/Sex: Treating RN: 1946/05/28 (77 y.o. [name] Primary Care Provider: [name] Other Clinician: Referring Provider: Treating Provider/Extender: [name] in Treatment: 2 Information Obtained from: Patient Chief Complaint Left LE Ulcer Electronic Signature(s) Signed: 02/07/2023 8:38:45 AM By: Allen Derry PA-C Entered By: Allen Derry on 02/07/2023 08:38:45 -------------------------------------------------------------------------------- HPI Details Patient Name: Date of Service: [name] [name], [name] 02/07/2023 8:30 [name] M Medical Record Number: [id number] Patient Account Number: [id number] Date of Birth/Sex: Treating RN: Nov 11, 1945 (77 y.o. [name] Primary Care Provider: [name] Other Clinician: Referring Provider: Treating Provider/Extender: [name] in Treatment: 2 History of Present Illness HPI Description: 77 year old male who has [name] past medical history of essential hypertension, chronic atrial fibrillation, peripheral vascular disease, nonischemic cardiomyopathy,venous stasis dermatitis, gouty arthropathy, basal cell carcinoma of the right lower extremity, benign prostatic hypertrophy, long- term use of anticoagulation therapy, hyperglycemia and exercise intolerance has never been [name] smoker. the patient has had [name] vascular workup over 7 years ago and said everything was normal at that stage. He does not have any chronic problems except for cardiac issues which he sees [name] cardiologist in [place]. 08/15/2017 -- arterial and venous duplex studies still pending. 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 No evidence of SVT or DVT in bilateral lower extremities The patient has an appointment at the end of the month to get his arterial duplex study done 09/05/2017 -- the patient was seen at the vein and vascular office yesterday by Bary Castilla. ABI studies were notable for medial calcification and the toe brachial indices were normal and bilateral ankle-brachial) waveforms were normal with triphasic flow. After review of his venous studies he was not [name] candidate for laser ablation and his lymphedema was to be treated with compression stockings and lymphedema pump pumps 09/12/2017 -- had [name] low arterial study done at the [place] [facility] -- unable to obtain reliable ABI is due to medial calcification. Bilateral toe indices were normal with the right being 1.01 and the left being 0.92 and the waveforms were triphasic bilaterally. he did get hold of 30-40 mm compression stockings but is unable to put these on. We will try and get him alternative compression stockings. 09/26/17- he is here in follow up evaluation of [name] right lower extremity ulcer;he is compliant in wearing compression stocking; ulcer almost epithelialized , anticipate healing next appointment Readmission: 11/17 point upon evaluation patient's wound currently that he is seeing [place] for today is [name] skin cancerous lesion that was cleared away by his dermatologist on the left medial calf region. He tells me that this is [name] very similar thing to what he had done previously in fact the last time he saw him in 2018 this was also what was going on at that point. Nonetheless he feels that based on what he seeing currently that this is just having [name] lot of harder time healing although it is much closer to the surface than what he is experienced in the past. He notes  that the initial removal was in June  2022 which was this year this is now November and still has not closed. He does have some edema and definitely I think that there is some venous component to his slow healing here. Also think that we can do something better than Vaseline to try to help with getting this to clear up as quickly as possible. He does have [name] history of atrial fibrillation and is on Eliquis otherwise he really has no major medical problems that would affect wound healing. 09/07/2021 upon evaluation today patient actually appears to be doing significantly better after having wrapped him last week. Overall I think that this is making significant improvements at this time which is great news. I do not see any evidence of infection which is great news as well. No fevers, chills, nausea, vomiting, or diarrhea. 09/14/2021 upon evaluation today patient appears to be doing well with regard to his leg ulcer. He has been tolerating the dressing changes and overall I think that he is making excellent progress. I do not see any signs of active infection at this time. 09/21/2021 upon evaluation today patient actually appears to be making good progress with regard to his wound this is again measuring smaller today no debridement seems to be necessary. We have been using [name] silver collagen dressing and I think that is doing an awesome job. [name], [name] [id number] Page 2 of 9 09/28/2021 upon evaluation today patient appears to be doing well with regard to his leg currently. I do not see any signs of active infection at this time which is great news. No fevers, chills, nausea, vomiting, or diarrhea. I think this wound is very close to complete resolution. 10/12/2021 upon evaluation today patient actually appears to be doing awesome in regard to his leg ulcer. In fact this appears to be completely healed based on what I am seeing currently. I do not see any evidence  of active infection locally nor systemically at this time which is also great news. No fevers, chills, nausea, vomiting, or diarrhea. Readmission: 12/07/2021 upon evaluation today patient presents for readmission here in the clinic. He was discharged on 10/12/2021 is completely healed. Unfortunately this has reopened at this point and he is having continual issues with new blisters over both lower extremities. This is even worse than what we previously saw. Nonetheless we did actually check his ABIs today and it did reveal that his ABIs were 0.55 on the left and 0.57 on the right. Subsequently this is [name] definite change from his last arterial study which showed that he did have good blood flow at 1.01 on the right and 0.92 on the left and that was right at the beginning of 2019. Nonetheless based on what we see currently I do think he tolerated the 3 layer compression wrap but I do believe that we probably need to get him tested for his arterial flow in order to see where things stand and if there is something we can do there that would help prevent this from continue to be an ongoing issue. He did not utilize compression socks in the interim from when he was last here till this time. That something is probably going to need lifelong going forward as well. 3/9; patient presents for follow-up. He has no issues or complaints today. He tolerated the compression wrap well. He had ABIs with TBI's done. He denies signs of infection. 12/21/2021 upon evaluation today patient appears to be doing well with regard to the wounds on his legs. Both  are showing signs of significant improvement which is great news although I do believe some sharp debridement would be of benefit here as well. 12/28/2021 upon evaluation today patient appears to be doing well with regard to his wounds. Everything is showing signs of excellent improvement which I am very pleased about. I think that we are headed in the right direction here.  Fortunately there does not appear to be any evidence of infection which is great news there is [name] little bit of hypergranulation. 01/04/2022 upon evaluation today patient appears to be doing well with regard to his wounds 2 of them are healed 1 is almost so and the other 1 is significantly better. Overall I am extremely pleased with where we stand and I think that he is making excellent progress here. I do not see any evidence of active infection locally nor systemically at this time. 01-16-2022 upon evaluation today patient's wound on the left leg is showing signs of doing quite well. Has not completely cleared at this point but it is much improved. Fortunately I do not see any signs of infection at this time. No fevers, chills, nausea, vomiting, or diarrhea. 01-23-2022 upon evaluation today patient's wound of the left leg actually appears to be pretty much completely healed which is great news. I do not see any signs of active infection locally or systemically which is excellent. With that being said on the right leg what wound is measuring smaller the other 1 is [name] new wound that just showed up fortunately its not too bad. Has been using Xeroform here and that seems to be doing decently well which is great news. Unfortunately his blood pressure is significantly high we gave him the readings for the past 4-5 visits as well as [name] recommendation to make an appointment to go discuss this with his primary care provider patient states that he is going to look into doing this. 01-30-2022 upon evaluation today patient appears to be doing well with regard to his left leg everything appears to be healed. On the right leg the more anterior wound is healed the more medial wound that I been concerned about [name] possible skin cancer unfortunately still does not look great to me. I do believe that we should probably do [name] biopsy I have talked about it with him [name] few times I think though it is probably time to go ahead and do  this at this point. 02-09-2022 upon evaluation today patient appears to be doing well with regard to his legs. On the left this appears to be completely healed. On the right he does have 2 areas and be perfectly honest one of them is [name] skin cancer that he is going to the Mohs surgery clinic for the other seems to be healing nicely. Readmission: 08-02-2022 upon evaluation today patient appears for reevaluation here in our clinic concerning issues that he has been having with wounds over the bilateral lower extremities. I last saw him in May 2023 and at that point we had him completely healed. Unfortunately he is tells me this has broken down to some degree since that point. 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 He tells me this 1 seems to just be getting bigger and bigger not improving. Fortunately he is not having  any significant pain which is good news he does have quite [name] bit of swelling and he tells me that his fluid pills are not recommended for him to take daily but just in 3-day intervals here and there. 08-09-2022 upon evaluation today patient appears to be doing still somewhat poorly in regard to his legs although in general he does not appear to be feeling as good as he has been. Fortunately there does not appear to be any signs of infection which is good news. With that being said he is having some issues here with having and overall poor feeling in general which again is good I think going to be the biggest complicating factor. He actually seems to be coughing I do not hear any wheezing right now I did listen to his chest he did not have good airflow down low however makes me suspicious for bronchitis or even possibly pneumonia which could be part of what is going on here as well.  Fortunately I do not see any evidence of anything worsening in regard to his legs but I definitely believe that he needs to continue with the compression wraps he took them off yesterday to shower has not had anything on for 24 hours that is why his legs are so swollen today. With regard to his pathology report I did review that it showed some squamous abnormality but no signs of distinct carcinoma. With that being said it was saying that it could be adjacent to [name] squamous cell carcinoma nonetheless my suggestion is can be that we have the patient take copy of this report and give it to his Mohs surgeon in order for them to see if there is anything they feel like needs to be done further. With that being said right now I feel like the primary thing is going to be for [place] to try to get his swelling down and keep that down into that hand since he is having so much drainage I believe we can have to bring him in for dressing changes twice [name] week doing [name] nurse visit on Mondays. 11/9; since the patient was last here he spent the night in the emergency room he received IV Lasix. Also received antibiotics although he was not discharged on either 1 of these. He also saw his cardiology office who put him on regular Lasix 20 mg [previously on as needed Lasix 20 mg]. Per our intake nurse the swelling in his legs is remarkably better but he still has bilateral lower extremity wounds. He still has wounds on the bilateral lower extremities most problematically on the left lateral calf. He has been using silver alginate under 3 layer compression. 08-23-2022 upon evaluation today patient appears to be doing much better than the last time I saw him 2 weeks ago. At that point I was very concerned about how he was doing he did see Dr. Sherrie Mustache his primary care provider they got him on some blood pressure medication in general his color and overall appearance looks to be doing much improved compared to the last time I saw  him. 09-04-2022 upon evaluation today patient appears to be doing well currently in regard to his wounds. Everything is showing signs of improvement which is great news. Fortunately there does not appear to be any signs of active infection locally or systemically at this time. No fevers, chills, nausea, vomiting, or diarrhea. 09-10-2022 upon evaluation today patient appears to be doing better in regard to his wounds although the [facility]  Blue was extremely stuck to the wound bed. Fortunately there does not appear to be any signs of infection locally or systemically at this time which is great news. No fevers, chills, nausea, vomiting, or diarrhea. 09-17-2022 upon evaluation today patient appears to be doing well currently in regard to his wounds in general. The right leg actually showing signs of excellent improvement and very pleased with where things stand in that regard. Fortunately I do not see any evidence of infection locally or systemically at this time [name], [name] ([id number]) [id number] Page 3 of 9 which is great news. No fevers, chills, nausea, vomiting, or diarrhea. 09-24-2022 upon evaluation today patient appears to be doing well currently in regard to his wounds. Things look to be doing quite well. 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 He is seeing his dermatologist tomorrow in that regard. With that being said other than that however he seems to really be making some pretty good progress here which is good news. No fevers, chills, nausea, vomiting, or diarrhea. 12/26; the patient has 2 open wounds remaining on the left leg. One is on the left anterior mid tibia and the other is on the right lateral knee just outside of the popliteal fossa. 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 The  patient has been to see dermatology Dr. Adolphus Birchwood who apparently is making him [name] referral to the [facility] center. He does not yet have an appointment 10-12-2022 upon evaluation today patient appears to be doing well currently in regard to his wound. He has been tolerating the dressing changes without complication and overall feel like we are headed in the right direction. Fortunately I do not see any signs of infection locally or systemically at this time which is great news. No fevers, chills, nausea, vomiting, or diarrhea. 10-23-2022 upon evaluation today patient appears to be doing well currently in regard to his wound. He has been tolerating the dressing changes without complication. Fortunately there does not appear to be any signs of active infection locally nor systemically which is great news and overall I am extremely pleased with where we stand currently. No fevers, chills, nausea, vomiting, or diarrhea. 10-26-2022 upon evaluation today patient appears to be doing well currently in regard to his wounds. Everything is showing signs of improvement and this is great news. Fortunately I see no evidence of active infection systemically. He does seem to be doing much better in regard to the local infection in regard to his leg. The smell is also greatly improved. Overall I am extremely happy with where we stand today. This is after just [name] few days with the antibiotic on board. 11-05-2022 upon evaluation today patient appears to be doing well currently in regard to his wounds although the wound where they performed the Mohs surgery does look [name] little bit hyper granulated I think switching to [facility] may be better for him. He voiced understanding. Fortunately there does not appear to be any evidence of active infection locally nor systemically at this time. 11-12-2022 upon evaluation today patient appears to be doing better in regard to both wounds he has been tolerating the dressing  changes without complication. There is no signs of infection and in general I think you are doing quite well. No fevers, chills, nausea, vomiting, or diarrhea. 11-20-2022 upon  evaluation today patient appears to be doing well currently in regard to his wounds. He has been tolerating the dressing changes without complication. Fortunately there does not appear to be any signs of infection at this time. No fevers, chills, nausea, vomiting, or diarrhea. 11-27-2022 upon evaluation today patient appears to be doing somewhat poorly in regard to his leg in general he has [name] lot of areas where he looks like he had some spots that popped up. There with regard to new possible blisters. In general I am actually very concerned about the fact that the wrap may be causing some irritation here. I think that we can try to not do the wrap for 1 week, and given the prescription for mupirocin ointment which I will send into the pharmacy for him. 12-04-2022 upon evaluation today patient appears to be doing well currently in regard to his wounds in fact he appears to be pretty much completely healed based on what I am seeing at this point. I do not see any signs of active infection locally nor systemically at this time which is great news. No fevers, chills, nausea, vomiting, or diarrhea. 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 I discussed that with him today he is aware of the situation.-2024 upon evaluation patient presents for readmission here in the clinic actually last saw him December 04, 2022. With that being said previously his dermatologist had asked that if there was something the need to be addressed from [name] dermatology standpoint and specifically biopsy that we make referral back to them to allow them to do it which I am definitely happy to do. 01-31-2023  upon evaluation today patient appears to be doing well currently in regard to his wound in fact this looks better he did see Dr. Adolphus Birchwood his dermatologist yesterday and they did perform [name] biopsy. With that being said he actually looks like he is doing much better Dr. Adolphus Birchwood feels like this may not be [name] cancerous lesion which will be very good news at the same time I definitely wanted to make sure especially considering his history and the way this looks when we saw him last week. Nonetheless I am extremely pleased with the fact that he is doing so much better at this point. 02-07-2023 upon evaluation today patient appears to be doing well currently in regard to his wound from the standpoint of size it has not gotten any larger. With that being said he does have some issues here still with what appears to be potentially some infection. Wound does not look quite as good as it did last week. We are still waiting on the results for the pathology. I did put [name] call into dermatology but I have not heard anything back from them as of yet. Electronic Signature(s) Signed: 02/07/2023 9:09:48 AM By: Allen Derry PA-C Entered By: Allen Derry on 02/07/2023 09:09:48 -------------------------------------------------------------------------------- Physical Exam Details Patient Name: Date of Service: [name] [name], [name] 02/07/2023 8:30 [name] M Medical Record Number: [id number] Patient Account Number: [id number] Date of Birth/Sex: Treating RN: 1946/01/24 (77 y.o. [name] Primary Care Provider: [name] Other Clinician: Referring Provider: Treating Provider/Extender: [name] in Treatment: 2 Constitutional Well-nourished and well-hydrated in no acute distress. Respiratory normal breathing without difficulty. Psychiatric this patient is able to make decisions and demonstrates  good insight into disease process. Alert and Oriented x 3. pleasant and cooperative. [name], [name]  ([id number]) [id number] Page 4 of 9 Notes Upon inspection patient's wound bed actually showed signs of some hypergranulation were using the Hydrofera Blue does have some purulent drainage as well though and I am little concerned about infection. I did obtain [name] wound culture today I am also going to get him started on some oral antibiotics. Electronic Signature(s) Signed: 02/07/2023 9:10:02 AM By: Allen Derry PA-C Entered By: Allen Derry on 02/07/2023 09:10:02 -------------------------------------------------------------------------------- Physician Orders Details Patient Name: Date of Service: [name] [name], [name] 02/07/2023 8:30 [name] M Medical Record Number: [id number] Patient Account Number: [id number] Date of Birth/Sex: Treating RN: 31-Dec-1945 (77 y.o. [name] Primary Care Provider: [name] Other Clinician: Referring Provider: Treating Provider/Extender: [name] in Treatment: 2 Verbal / Phone Orders: No Diagnosis Coding ICD-10 Coding Code Description 5511988108 Chronic venous hypertension (idiopathic) with ulcer and inflammation of bilateral lower extremity L97.822 Non-pressure chronic ulcer of other part of left lower leg with fat layer exposed I73.89 Other specified peripheral vascular diseases I48.0 Paroxysmal atrial fibrillation I10 Essential (primary) hypertension Follow-up Appointments Return Appointment in 1 week. Nurse Visit as needed Edema Control - Lymphedema / Segmental Compressive Device / Other UrgoK2 LITE Wound Treatment Wound #20 - Lower Leg Wound Laterality: Left, Lateral Prim Dressing: Hydrofera Blue Ready Transfer Foam, 2.5x2.5 (in/in) ary Discharge Instructions: Apply Hydrofera Blue Ready to wound bed as directed Secondary Dressing: Zetuvit Plus 4x4 (in/in) Compression Wrap: Urgo K2, two layer compression system, regular Laboratory Bacteria identified in Wound by Culture (MICRO) - Left  lateral lower leg LOINC Code: 6462-6 Convenience Name: Wound culture routine Patient Medications llergies: No Known Allergies [name] Notifications Medication Indication Start End 02/07/2023 levofloxacin DOSE 1 - oral 500 mg tablet - 1 tablet oral once daily x 14 days do not take at multivitamin at the same time as this medication Electronic Signature(s) Signed: 02/07/2023 4:17:31 PM By: Allen Derry PA-C Signed: 02/08/2023 1:06:24 PM By: [name] MSN RN CNS WTA Previous Signature: 02/07/2023 9:11:35 AM Version By: Allen Derry PA-C Entered By: [name] on 02/07/2023 09:32:55 [name] ([id number]) [id number] Page 5 of 9 -------------------------------------------------------------------------------- Problem List Details Patient Name: Date of Service: [name] 02/07/2023 8:30 [name] M Medical Record Number: [id number] Patient Account Number: [id number] Date of Birth/Sex: Treating RN: 22-Mar-1946 (77 y.o. 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/18/2024 No Yes I73.89 Other specified peripheral vascular diseases 01/24/2023 No Yes I48.0 Paroxysmal atrial fibrillation 01/24/2023 No Yes I10 Essential (primary) hypertension 01/24/2023 No Yes Inactive Problems Resolved Problems Electronic Signature(s) Signed: 02/07/2023 4:17:31 PM By: Allen Derry PA-C Signed: 02/08/2023 1:06:24 PM By: [name] MSN RN CNS WTA Previous Signature: 02/07/2023 8:38:42 AM Version By: Allen Derry PA-C Entered By: [name] on 02/07/2023  09:03:32 -------------------------------------------------------------------------------- Progress Note Details Patient Name: Date of Service: [name] [name], [name] 02/07/2023 8:30 [name] M Medical Record Number: [id number] Patient Account Number: [id number] Date of Birth/Sex: Treating RN: Aug 05, 1946 (77 y.o. 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 the patient has had [name] vascular workup over 7 years ago and said everything was normal at that stage. He does not have any chronic problems except for cardiac issues which he sees [name] cardiologist in [place]. 08/15/2017 -- arterial and venous duplex studies still pending. 08/23/2017 -- venous reflux studies done on 08/13/2017 shows venous incompetence throughout the left lower extremity deep system and focally at the left SONG, GENNUSA ([id number]) [id number] Page 6 of 9 saphenofemoral junction. 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 No evidence of SVT or DVT in bilateral lower extremities The patient has an appointment at the end of the month to get his arterial duplex study done 09/05/2017 -- the patient was seen at the vein and vascular office yesterday by Bary Castilla. ABI studies were notable for medial calcification and the toe brachial indices were normal and bilateral  ankle-brachial) waveforms were normal with triphasic flow. After review of his venous studies he was not [name] candidate for laser ablation and his lymphedema was to be treated with compression stockings and lymphedema pump pumps 09/12/2017 -- had [name] low arterial study done at the [facility] -- unable to obtain reliable ABI is due to medial calcification. Bilateral toe indices were normal with the right being 1.01 and the left being 0.92 and the waveforms were triphasic bilaterally. he did get hold of 30-40 mm compression stockings but is unable to put these on. We will try and get him alternative compression stockings. 09/26/17- he is here in follow up evaluation of [name] right lower extremity ulcer;he is compliant in wearing compression stocking; ulcer almost epithelialized , anticipate healing next appointment Readmission: 11/17 point upon evaluation patient's wound currently that he is seeing [place] for today is [name] skin cancerous lesion that was cleared away by his dermatologist on the left medial calf region. He tells me that this is [name] very similar thing to what he had done previously in fact the last time he saw him in 2018 this was also what was going on at that point. Nonetheless he feels that based on what he seeing currently that this is just having [name] lot of harder time healing although it is much closer to the surface than what he is experienced in the past. He notes that the initial removal was in June 2022 which was this year this is now November and still has not closed. He does have some edema and definitely I think that there is some venous component to his slow healing here. Also think that we can do something better than Vaseline to try to help with getting this to clear up as quickly as possible. He does have [name] history of atrial fibrillation and is on Eliquis otherwise he really has no major medical problems that would affect wound healing. 09/07/2021 upon evaluation today  patient actually appears to be doing significantly better after having wrapped him last week. Overall I think that this is making significant improvements at this time which is great news. I do not see any evidence of infection which is great news  as well. No fevers, chills, nausea, vomiting, or diarrhea. 09/14/2021 upon evaluation today patient appears to be doing well with regard to his leg ulcer. He has been tolerating the dressing changes and overall I think that he is making excellent progress. I do not see any signs of active infection at this time. 09/21/2021 upon evaluation today patient actually appears to be making good progress with regard to his wound this is again measuring smaller today no debridement seems to be necessary. We have been using [name] silver collagen dressing and I think that is doing an awesome job. 09/28/2021 upon evaluation today patient appears to be doing well with regard to his leg currently. I do not see any signs of active infection at this time which is great news. No fevers, chills, nausea, vomiting, or diarrhea. I think this wound is very close to complete resolution. 10/12/2021 upon evaluation today patient actually appears to be doing awesome in regard to his leg ulcer. In fact this appears to be completely healed based on what I am seeing currently. I do not see any evidence of active infection locally nor systemically at this time which is also great news. No fevers, chills, nausea, vomiting, or diarrhea. Readmission: 12/07/2021 upon evaluation today patient presents for readmission here in the clinic. He was discharged on 10/12/2021 is completely healed. Unfortunately this has reopened at this point and he is having continual issues with new blisters over both lower extremities. This is even worse than what we previously saw. Nonetheless we did actually check his ABIs today and it did reveal that his ABIs were 0.55 on the left and 0.57 on the right. Subsequently this  is [name] definite change from his last arterial study which showed that he did have good blood flow at 1.01 on the right and 0.92 on the left and that was right at the beginning of 2019. Nonetheless based on what we see currently I do think he tolerated the 3 layer compression wrap but I do believe that we probably need to get him tested for his arterial flow in order to see where things stand and if there is something we can do there that would help prevent this from continue to be an ongoing issue. He did not utilize compression socks in the interim from when he was last here till this time. That something is probably going to need lifelong going forward as well. 3/9; patient presents for follow-up. He has no issues or complaints today. He tolerated the compression wrap well. He had ABIs with TBI's done. He denies signs of infection. 12/21/2021 upon evaluation today patient appears to be doing well with regard to the wounds on his legs. Both are showing signs of significant improvement which is great news although I do believe some sharp debridement would be of benefit here as well. 12/28/2021 upon evaluation today patient appears to be doing well with regard to his wounds. Everything is showing signs of excellent improvement which I am very pleased about. I think that we are headed in the right direction here. Fortunately there does not appear to be any evidence of infection which is great news there is [name] little bit of hypergranulation. 01/04/2022 upon evaluation today patient appears to be doing well with regard to his wounds 2 of them are healed 1 is almost so and the other 1 is significantly better. Overall I am extremely pleased with where we stand and I think that he is making excellent progress here. I do not  see any evidence of active infection locally nor systemically at this time. 01-16-2022 upon evaluation today patient's wound on the left leg is showing signs of doing quite well. Has not  completely cleared at this point but it is much improved. Fortunately I do not see any signs of infection at this time. No fevers, chills, nausea, vomiting, or diarrhea. 01-23-2022 upon evaluation today patient's wound of the left leg actually appears to be pretty much completely healed which is great news. I do not see any signs of active infection locally or systemically which is excellent. With that being said on the right leg what wound is measuring smaller the other 1 is [name] new wound that just showed up fortunately its not too bad. Has been using Xeroform here and that seems to be doing decently well which is great news. Unfortunately his blood pressure is significantly high we gave him the readings for the past 4-5 visits as well as [name] recommendation to make an appointment to go discuss this with his primary care provider patient states that he is going to look into doing this. 01-30-2022 upon evaluation today patient appears to be doing well with regard to his left leg everything appears to be healed. On the right leg the more anterior wound is healed the more medial wound that I been concerned about [name] possible skin cancer unfortunately still does not look great to me. I do believe that we should probably do [name] biopsy I have talked about it with him [name] few times I think though it is probably time to go ahead and do this at this point. 02-09-2022 upon evaluation today patient appears to be doing well with regard to his legs. On the left this appears to be completely healed. On the right he does have 2 areas and be perfectly honest one of them is [name] skin cancer that he is going to the Mohs surgery clinic for the other seems to be healing nicely. Readmission: 08-02-2022 upon evaluation today patient appears for reevaluation here in our clinic concerning issues that he has been having with wounds over the bilateral lower extremities. I last saw him in May 2023 and at that point we had him completely healed.  Unfortunately he is tells me this has broken down to some degree since that point. 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 He tells me this 1 seems to just be getting bigger and bigger not improving. Fortunately he is not having any significant pain which is good news he does have quite [name] bit of swelling and he tells me that his fluid pills are not recommended for him to take daily but just in 3-day intervals here and there. 08-09-2022 upon evaluation today patient appears to be doing still somewhat poorly in regard to his legs although in general he does not appear to be feeling as [name], [name] ([id number]) [id number] Page 7 of 9 good as he has been. Fortunately there does not appear to be any signs of infection which is good news. With that being said he is having some issues here with having and overall poor feeling in general which again is good I think going to be the biggest complicating factor. He actually seems to be coughing I do not hear any  wheezing right now I did listen to his chest he did not have good airflow down low however makes me suspicious for bronchitis or even possibly pneumonia which could be part of what is going on here as well. Fortunately I do not see any evidence of anything worsening in regard to his legs but I definitely believe that he needs to continue with the compression wraps he took them off yesterday to shower has not had anything on for 24 hours that is why his legs are so swollen today. With regard to his pathology report I did review that it showed some squamous abnormality but no signs of distinct carcinoma. With that being said it was saying that it could be adjacent to [name] squamous cell carcinoma nonetheless my suggestion is can  be that we have the patient take copy of this report and give it to his Mohs surgeon in order for them to see if there is anything they feel like needs to be done further. With that being said right now I feel like the primary thing is going to be for [place] to try to get his swelling down and keep that down into that hand since he is having so much drainage I believe we can have to bring him in for dressing changes twice [name] week doing [name] nurse visit on Mondays. 11/9; since the patient was last here he spent the night in the emergency room he received IV Lasix. Also received antibiotics although he was not discharged on either 1 of these. He also saw his cardiology office who put him on regular Lasix 20 mg [previously on as needed Lasix 20 mg]. Per our intake nurse the swelling in his legs is remarkably better but he still has bilateral lower extremity wounds. He still has wounds on the bilateral lower extremities most problematically on the left lateral calf. He has been using silver alginate under 3 layer compression. 08-23-2022 upon evaluation today patient appears to be doing much better than the last time I saw him 2 weeks ago. At that point I was very concerned about how he was doing he did see Dr. Sherrie Mustache his primary care provider they got him on some blood pressure medication in general his color and overall appearance looks to be doing much improved compared to the last time I saw him. 09-04-2022 upon evaluation today patient appears to be doing well currently in regard to his wounds. Everything is showing signs of improvement which is great news. Fortunately there does not appear to be any signs of active infection locally or systemically at this time. No fevers, chills, nausea, vomiting, or diarrhea. 09-10-2022 upon evaluation today patient appears to be doing better in regard to his wounds although the [facility] was extremely stuck to the wound bed. Fortunately there does not appear to be any  signs of infection locally or systemically at this time which is great news. No fevers, chills, nausea, vomiting, or diarrhea. 09-17-2022 upon evaluation today patient appears to be doing well currently in regard to his wounds in general. The right leg actually showing signs of excellent improvement and very pleased with where things stand in that regard. Fortunately I do not see any evidence of infection locally or systemically at this time which is great news. No fevers, chills, nausea, vomiting, or diarrhea. 09-24-2022 upon evaluation today patient appears to be doing well currently in regard to his wounds. Things look to be doing quite well. 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 He is seeing his dermatologist tomorrow in that regard. With that being said other than that however he seems to really be making some pretty good progress here which is good news. No fevers, chills, nausea, vomiting, or diarrhea. 12/26; the patient has 2 open wounds remaining on the left leg. One is on the left anterior mid tibia and the other is on the right lateral knee just outside of the popliteal fossa. 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 The patient has been to see dermatology Dr. Adolphus Birchwood who apparently is making him [name] referral to the [facility]. He does not yet have an appointment 10-12-2022 upon evaluation today patient appears to be doing well currently in regard to his wound. He has been tolerating the dressing changes without complication and overall feel like we are headed in the right direction. Fortunately I do not see any signs of infection locally or systemically at this time which is great news. No fevers, chills, nausea, vomiting, or diarrhea. 10-23-2022 upon evaluation today patient appears to be doing well  currently in regard to his wound. He has been tolerating the dressing changes without complication. Fortunately there does not appear to be any signs of active infection locally nor systemically which is great news and overall I am extremely pleased with where we stand currently. No fevers, chills, nausea, vomiting, or diarrhea. 10-26-2022 upon evaluation today patient appears to be doing well currently in regard to his wounds. Everything is showing signs of improvement and this is great news. Fortunately I see no evidence of active infection systemically. He does seem to be doing much better in regard to the local infection in regard to his leg. The smell is also greatly improved. Overall I am extremely happy with where we stand today. This is after just [name] few days with the antibiotic on board. 11-05-2022 upon evaluation today patient appears to be doing well currently in regard to his wounds although the wound where they performed the Mohs surgery does look [name] little bit hyper granulated I think switching to [facility] [place] may be better for him. He voiced understanding. Fortunately there does not appear to be any evidence of active infection locally nor systemically at this time. 11-12-2022 upon evaluation today patient appears to be doing better in regard to both wounds he has been tolerating the dressing changes without complication. There is no signs of infection and in general I think you are doing quite well. No fevers, chills, nausea, vomiting, or diarrhea. 11-20-2022 upon evaluation today patient appears to be doing well currently in regard to his wounds. He has been tolerating the dressing changes without complication. Fortunately there does not appear to be any signs of infection at this time. No fevers, chills, nausea, vomiting, or diarrhea. 11-27-2022 upon evaluation today patient appears to be doing somewhat poorly in regard to his leg in general he has [name] lot of areas where he looks like he  had some spots that popped up. There with regard to new possible blisters. In general I am actually very concerned about the fact that the wrap may be causing some irritation here. I think that we can try to not do the wrap for 1 week, and given the prescription for mupirocin ointment which I will send into the pharmacy for him. 12-04-2022 upon evaluation  today patient appears to be doing well currently in regard to his wounds in fact he appears to be pretty much completely healed based on what I am seeing at this point. I do not see any signs of active infection locally nor systemically at this time which is great news. No fevers, chills, nausea, vomiting, or diarrhea. 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 I discussed that with him today he is aware of the situation.-2024 upon evaluation patient presents for readmission here in the clinic actually last saw him December 04, 2022. With that being said previously his dermatologist had asked that if there was something the need to be addressed from [name] dermatology standpoint and specifically biopsy that we make referral back to them to allow them to do it which I am definitely happy to do. 01-31-2023 upon evaluation today patient appears to be doing well currently in regard to his wound in fact this looks better he did see Dr. Adolphus Birchwood his dermatologist yesterday and they did perform [name] biopsy. With that being said he actually looks like he is doing much better Dr. Adolphus Birchwood feels like this may not be [name] cancerous lesion which will be very good news at the same time I definitely wanted to make sure especially considering his history and the way this looks when we saw him last week. Nonetheless I am extremely pleased with the fact that he is doing so much better at this point. 02-07-2023 upon evaluation  today patient appears to be doing well currently in regard to his wound from the standpoint of size it has not gotten any larger. With that being said he does have some issues here still with what appears to be potentially some infection. Wound does not look quite as good as it did last week. We are still waiting on the results for the pathology. I did put [name] call into dermatology but I have not heard anything back from them as of yet. [name], [name] ([id number]) [id number] Page 8 of 9 Objective Constitutional Well-nourished and well-hydrated in no acute distress. Vitals Time Taken: 8:31 AM, Height: 75 in, Weight: 220 lbs, BMI: 27.5, Temperature: 98.4 F, Pulse: 63 bpm, Respiratory Rate: 16 breaths/min, Blood Pressure: 170/79 mmHg. Respiratory normal breathing without difficulty. Psychiatric this patient is able to make decisions and demonstrates good insight into disease process. Alert and Oriented x 3. pleasant and cooperative. General Notes: Upon inspection patient's wound bed actually showed signs of some hypergranulation were using the Hydrofera Blue does have some purulent drainage as well though and I am little concerned about infection. I did obtain [name] wound culture today I am also going to get him started on some oral antibiotics. Integumentary (Hair, Skin) Wound #20 status is Open. Original cause of wound was Gradually Appeared. The date acquired was: 12/24/2022. The wound has been in treatment 2 weeks. The wound is located on the Left,Lateral Lower Leg. The wound measures 4.6cm length x 3.3cm width x 0.1cm depth; 11.922cm^2 area and 1.192cm^3 volume. There is Fat Layer (Subcutaneous Tissue) exposed. There is [name] large amount of serous drainage noted. The wound margin is thickened. There is no granulation within the wound bed. There is [name] large (67-100%) amount of necrotic tissue within the wound bed including Adherent Slough. Assessment Active  Problems ICD-10 Chronic venous hypertension (idiopathic) with ulcer and  inflammation of bilateral lower extremity Non-pressure chronic ulcer of other part of left lower leg with fat layer exposed Other specified peripheral vascular diseases Paroxysmal atrial fibrillation Essential (primary) hypertension Procedures Wound #20 Pre-procedure diagnosis of Wound #20 is an Atypical located on the Left,Lateral Lower Leg . There was [name] Double Layer Compression Therapy Procedure by [name], RN. Post procedure Diagnosis Wound #20: Same as Pre-Procedure Plan Follow-up Appointments: Return Appointment in 1 week. Nurse Visit as needed Edema Control - Lymphedema / Segmental Compressive Device / Other: UrgoK2 LITE The following medication(s) was prescribed: levofloxacin oral 500 mg tablet 1 1 tablet oral once daily x 14 days do not take at multivitamin at the same time as this medication starting 02/07/2023 WOUND #20: - Lower Leg Wound Laterality: Left, Lateral Prim Dressing: Hydrofera Blue Ready Transfer Foam, 2.5x2.5 (in/in) ary Discharge Instructions: Apply Hydrofera Blue Ready to wound bed as directed Secondary Dressing: Zetuvit Plus 4x4 (in/in) Com pression Wrap: Urgo K2, two layer compression system, regular 1. Would recommend currently that we have the patient continue to monitor for any signs of infection or worsening. Based on what I am seeing I do believe that we are in need to go ahead and get him started on some oral antibiotics and would recommend sending [name] prescription for Levaquin. 2. I am also can recommend that we follow-up and ensure that we get the results back from the pathology from dermatology. At this point I think that we are still waiting for the results to come in but I am not 100% sure. I am going to recommend that the patient should hopefully hear from [place] later today with regard to that result if we hear back from his dermatologist. TORREON, MUSICK ([id number])  [id number] Page 9 of 9 We will see patient back for reevaluation in 1 week here in the clinic. If anything worsens or changes patient will contact our office for additional recommendations. We will otherwise continue with the [facility] and the compression wrap as before. Electronic Signature(s) Signed: 02/07/2023 9:11:58 AM By: Allen Derry PA-C Entered By: Allen Derry on 02/07/2023 09:11:58 -------------------------------------------------------------------------------- SuperBill Details Patient Name: Date of Service: [name] [name], [name] 02/07/2023 Medical Record Number: [id number] Patient Account Number: [id number] Date of Birth/Sex: Treating RN: 1946/08/21 (77 y.o. [name] Primary Care Provider: [name] Other Clinician: Referring Provider: Treating Provider/Extender: [name] in Treatment: 2 Diagnosis Coding ICD-10 Codes Code Description [phone] Chronic venous hypertension (idiopathic) with ulcer and inflammation of bilateral lower extremity L97.822 Non-pressure chronic ulcer of other part of left lower leg with fat layer exposed I73.89 Other specified peripheral vascular diseases I48.0 Paroxysmal atrial fibrillation I10 Essential (primary) hypertension Facility Procedures : CPT4 Code: [id number] Description: (Facility Use Only) [phone] - APPLY MULTLAY COMPRS LWR LT LEG Modifier: Quantity: 1 Physician Procedures : CPT4 Code Description Modifier [id number] - WC PHYS LEVEL 4 - EST PT ICD-10 Diagnosis Description 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 L97.822 Non-pressure chronic ulcer of other  part of left lower leg with fat layer exposed I73.89 Other specified peripheral vascular diseases I48.0 Paroxysmal atrial fibrillation Quantity: 1 Electronic Signature(s) Signed: 02/07/2023 9:12:12 AM By: Allen Derry PA-C Entered By: Allen Derry on  02/07/2023 09:12:12

## 2023-02-09 LAB — AEROBIC CULTURE W GRAM STAIN (SUPERFICIAL SPECIMEN): Gram Stain: NONE SEEN

## 2023-02-09 NOTE — Progress Notes (Signed)
CORTLAND, SEME R ([id number]) [id number] Page 1 of 7 Visit Report for 02/07/2023 Arrival Information Details Patient Name: Date of Service: [name] 02/07/2023 8:30 [name] M Medical Record Number: [id number] Patient Account Number: [id number] Date of Birth/Sex: Treating RN: 05/28/1946 (77 y.o. [name] Primary Care Byanka Landrus: [name] Other Clinician: Referring Delsa Walder: Treating Harshil Cavallaro/Extender: [name] in Treatment: 2 Visit Information History Since Last Visit Added or deleted any medications: No Patient Arrived: Ambulatory Has Dressing in Place as Prescribed: Yes Arrival Time: 08:31 Has Compression in Place as Prescribed: Yes Accompanied By: self Pain Present Now: No Transfer Assistance: None Patient Identification Verified: Yes Secondary Verification Process Completed: Yes Patient Requires Transmission-Based Precautions: No Patient Has Alerts: Yes Patient Alerts: 12/2021 TBI R)0.75 L)0.72 Electronic Signature(s) Signed: 02/08/2023 1:06:24 PM By: [name] MSN RN CNS WTA Entered By: [name] on 02/07/2023 08:31:43 -------------------------------------------------------------------------------- Clinic Level of Care Assessment Details Patient Name: Date of Service: [name] [name], [name] LBERT R. 02/07/2023 8:30 [name] M Medical Record Number: [id number] Patient Account Number: [id number] Date of Birth/Sex: Treating RN: 1945/10/29 (77 y.o. [name] Primary Care Aamirah Salmi: [name] Other Clinician: Referring Caswell Alvillar: Treating Sevrin Sally/Extender: [name] in Treatment: 2 Clinic Level of Care Assessment Items TOOL 1 Quantity Score []  - 0 Use when EandM and Procedure is performed on INITIAL visit ASSESSMENTS - Nursing Assessment / Reassessment []  - 0 General Physical Exam (combine w/ comprehensive assessment (listed just below) when performed on new pt. evals) []  - 0 Comprehensive  Assessment (HX, ROS, Risk Assessments, Wounds Hx, etc.) ASSESSMENTS - Wound and Skin Assessment / Reassessment []  - 0 Dermatologic / Skin Assessment (not related to wound area) ASSESSMENTS - Ostomy and/or Continence Assessment and Care []  - 0 Incontinence Assessment and Management []  - 0 Ostomy Care Assessment and Management (repouching, etc.) PROCESS - Coordination of Care []  - 0 Simple Patient / Family Education for ongoing care []  - 0 Complex (extensive) Patient / Family Education for ongoing care []  - 0 Staff obtains Chiropractor, Records, T Results / Process Orders est []  - 0 Staff telephones HHA, Nursing Homes / Clarify orders / etc []  - 0 Routine Transfer to another Facility (non-emergent condition) []  - 0 Routine Hospital Admission (non-emergent condition) []  - 0 New Admissions / Manufacturing engineer / Ordering NPWT Apligraf, etc. , []  - 0 Emergency Hospital Admission (emergent condition) PROCESS - Special Needs LOCHLYN, PALLETT R ([id number]) [id number] Page 2 of 7 []  - 0 Pediatric / Minor Patient Management []  - 0 Isolation Patient Management []  - 0 Hearing / Language / Visual special needs []  - 0 Assessment of Community assistance (transportation, [name], etc.) []  - 0 Additional assistance / Altered mentation []  - 0 Support Surface(s) Assessment (bed, cushion, seat, etc.) INTERVENTIONS - Miscellaneous []  - 0 External ear exam []  - 0 Patient Transfer (multiple staff / Nurse, adult / Similar devices) []  - 0 Simple Staple / Suture removal (25 or less) []  - 0 Complex Staple / Suture removal (26 or more) []  - 0 Hypo/Hyperglycemic Management (do not check if billed separately) []  - 0 Ankle / Brachial Index (ABI) - do not check if billed separately Has the patient been seen at the hospital within the last three years: Yes Total Score: 0 Level Of Care: ____ Electronic Signature(s) Signed: 02/08/2023 1:06:24 PM By: [name]  MSN RN CNS WTA Entered By: [name] on 02/07/2023 09:02:52 -------------------------------------------------------------------------------- Compression Therapy Details Patient Name: Date  of Service: [name] [name], [name] LBERT R. 02/07/2023 8:30 [name] M Medical Record Number: [id number] Patient Account Number: [id number] Date of Birth/Sex: Treating RN: 1945-10-23 (77 y.o. [name] Primary Care Shantanique Hodo: [name] Other Clinician: Referring Jahayra Mazo: Treating Taiquan Campanaro/Extender: [name] in Treatment: 2 Compression Therapy Performed for Wound Assessment: Wound #20 Left,Lateral Lower Leg Performed By: Clinician [name], RN Compression Type: Double Layer Post Procedure Diagnosis Same as Pre-procedure Electronic Signature(s) Signed: 02/08/2023 1:06:24 PM By: [name] MSN RN CNS WTA Entered By: [name] on 02/07/2023 09:00:58 -------------------------------------------------------------------------------- Encounter Discharge Information Details Patient Name: Date of Service: [name] [name], [name] LBERT R. 02/07/2023 8:30 [name] M Medical Record Number: [id number] Patient Account Number: [id number] Date of Birth/Sex: Treating RN: [birth year] (77 y.o. [name] Primary Care Thecla Forgione: [name] Other Clinician: Referring Roshun Klingensmith: Treating Samyukta Cura/Extender: [name] in Treatment: 2 Encounter Discharge Information Items Discharge Condition: Stable Ambulatory Status: Ambulatory Discharge Destination: Home Transportation: Private Auto Accompanied By: self Schedule Follow-up Appointment: Yes Clinical Summary of Care: JEHAN, HAGEDORN ([id number]) [id number] Page 3 of 7 Electronic Signature(s) Signed: 02/08/2023 1:06:24 PM By: [name] MSN RN CNS WTA Entered By: [name] on 02/07/2023 09:04:06 -------------------------------------------------------------------------------- Lower Extremity Assessment  Details Patient Name: Date of Service: [name] [name], [name] LBERT R. 02/07/2023 8:30 [name] M Medical Record Number: [id number] Patient Account Number: [id number] Date of Birth/Sex: Treating RN: 02/29/1946 (77 y.o. [name] Primary Care Erma Joubert: [name] Other Clinician: Referring Rhyanna Sorce: Treating Letisia Schwalb/Extender: [name] in Treatment: 2 Edema Assessment Assessed: Kyra Searles: Yes] Franne Forts: No] Edema: [Left: [name]] [Right: o] Calf Left: Right: Point of Measurement: 39 cm From Medial Instep 36 cm Ankle Left: Right: Point of Measurement: 11 cm From Medial Instep 27 cm Vascular Assessment Pulses: Dorsalis Pedis Palpable: [Left:Yes] Electronic Signature(s) Signed: 02/08/2023 1:06:24 PM By: [name] MSN RN CNS WTA Entered By: [name] on 02/07/2023 08:45:02 -------------------------------------------------------------------------------- Multi Wound Chart Details Patient Name: Date of Service: [name] [name], [name] LBERT R. 02/07/2023 8:30 [name] M Medical Record Number: [id number] Patient Account Number: [id number] Date of Birth/Sex: Treating RN: 03-10-1946 (77 y.o. [name] Primary Care Lurleen Soltero: [name] Other Clinician: Referring Mylynn Dinh: Treating Zidan Helget/Extender: [name] in Treatment: 2 Vital Signs Height(in): 75 Pulse(bpm): 63 Weight(lbs): 220 Blood Pressure(mmHg): 170/79 Body Mass Index(BMI): 27.5 Temperature(F): 98.4 Respiratory Rate(breaths/min): 16 [20:Photos:] [[name]:[name]] Left, Lateral Lower Leg [name] Wound Location: Gradually Appeared [name] Wounding Event: JAKARRI, HOLZ ([id number]) [id number] Page 4 of 7 Atypical [name] Primary Etiology: Arrhythmia, Hypertension, Gout [name] Comorbid History: 12/24/2022 [name] Date Acquired: 2 [name] Weeks of Treatment: Open [name] Wound Status: No [name] Wound Recurrence: 4.6x3.3x0.1 [name] Measurements L x W x D (cm) 11.922 [name]  [name] [name] (cm) : rea 1.192 [name] Volume (cm) : 31.00% [name] % Reduction in [name] rea: 31.00% [name] % Reduction in Volume: Full Thickness Without Exposed [name] Classification: Support Structures Large [name] Exudate Amount: Serous [name] Exudate Type: amber [name] Exudate Color: Thickened [name] Wound Margin: None Present (0%) [name] Granulation Amount: Large (67-100%) [name] Necrotic Amount: Fat Layer (Subcutaneous Tissue): Yes [name] Exposed Structures: Fascia: No Tendon: No Muscle: No Joint: No Bone: No None [name] Epithelialization: Treatment Notes Electronic Signature(s) Signed: 02/08/2023 1:06:24 PM By: [name] MSN RN CNS WTA Entered By: [name] on 02/07/2023 08:46:02 -------------------------------------------------------------------------------- Multi-Disciplinary Care  Plan Details Patient Name: Date of Service: [name] [name] Corner R. 02/07/2023 8:30 [name] M Medical Record Number: [id number] Patient Account Number: [id number] Date of Birth/Sex: Treating RN: 1946/07/24 (77 y.o. [name] Primary Care Shalona Harbour: [name] Other Clinician: Referring Ilay Capshaw: Treating Aviyon Hocevar/Extender: [name] in Treatment: 2 Active Inactive Malignancy/Atypical Etiology Nursing Diagnoses: Knowledge deficit related to disease process and management of atypical ulcer etiology Knowledge deficit related to disease process and management of malignancy Goals: Patient/caregiver will verbalize understanding of disease process and disease management of atypical ulcer etiology Date Initiated: 01/24/2023 Target Resolution Date: 02/22/2023 Goal Status: Active Patient/caregiver will verbalize understanding of disease process and disease management of malignancy Date Initiated: 01/24/2023 Target Resolution Date: 02/22/2023 Goal Status: Active Interventions: Assess patient and family medical history for signs and symptoms of  malignancy/atypical etiology upon admission Provide education on atypical ulcer etiologies Provide education on malignant ulcerations Notes: Wound/Skin Impairment Nursing Diagnoses: Impaired tissue integrity Knowledge deficit related to smoking impact on wound healing Knowledge deficit related to ulceration/compromised skin integrity [name], [name] ([id number]) [id number] Page 5 of 7 Goals: Ulcer/skin breakdown will have [name] volume reduction of 30% by week 4 Date Initiated: 01/24/2023 Target Resolution Date: 02/14/2023 Goal Status: Active Ulcer/skin breakdown will have [name] volume reduction of 50% by week 8 Date Initiated: 01/24/2023 Target Resolution Date: 03/14/2023 Goal Status: Active Ulcer/skin breakdown will have [name] volume reduction of 80% by week 12 Date Initiated: 01/24/2023 Target Resolution Date: 04/11/2023 Goal Status: Active Ulcer/skin breakdown will heal within 14 weeks Date Initiated: 01/24/2023 Target Resolution Date: 04/25/2023 Goal Status: Active Interventions: Assess patient/caregiver ability to obtain necessary supplies Assess patient/caregiver ability to perform ulcer/skin care regimen upon admission and as needed Assess ulceration(s) every visit Provide education on ulcer and skin care Treatment Activities: Referred to DME Deneise Getty for dressing supplies : 01/24/2023 Skin care regimen initiated : 01/24/2023 Topical wound management initiated : 01/24/2023 Notes: Electronic Signature(s) Signed: 02/08/2023 1:06:24 PM By: [name] MSN RN CNS WTA Entered By: [name] on 02/07/2023 09:03:09 -------------------------------------------------------------------------------- Pain Assessment Details Patient Name: Date of Service: [name] [name], [name] LBERT R. 02/07/2023 8:30 [name] M Medical Record Number: [id number] Patient Account Number: [id number] Date of Birth/Sex: Treating RN: [birth year] (77 y.o. [name] Primary Care Maven Varelas: [name] Other  Clinician: Referring Jahmel Flannagan: Treating Parley Pidcock/Extender: [name] in Treatment: 2 Active Problems Location of Pain Severity and Description of Pain Patient Has Paino No Site Locations Pain Management and Medication Current Pain Management: Electronic Signature(s) Signed: 02/08/2023 1:06:24 PM By: [name] MSN RN CNS WTA Entered By: [name] on 02/07/2023 08:34:43 [name], [name] R ([id number]) [id number] Page 6 of 7 -------------------------------------------------------------------------------- Patient/Caregiver Education Details Patient Name: Date of Service: [name] 5/2/2024andnbsp8:30 [name] M Medical Record Number: [id number] Patient Account Number: [id number] Date of Birth/Gender: Treating RN: 1946/05/20 (77 y.o. [name] Primary Care Physician: [name] Other Clinician: Referring Physician: Treating Physician/Extender: [name] in Treatment: 2 Education Assessment Education Provided To: Patient Education Topics Provided Wound/Skin Impairment: Handouts: Caring for Your Ulcer Methods: Explain/Verbal Responses: State content correctly Electronic Signature(s) Signed: 02/08/2023 1:06:24 PM By: [name] MSN RN CNS WTA Entered By: [name] on 02/07/2023 09:03:23 -------------------------------------------------------------------------------- Wound Assessment Details Patient Name: Date of Service: [name] [name], [name] LBERT R. 02/07/2023 8:30 [name] M Medical Record Number: [id number] Patient Account Number: [id number] Date of Birth/Sex: Treating RN: 1946/08/23 (77 y.o. [name] Primary Care Quanna Wittke:  [name] Other Clinician: Referring Hedy Garro: Treating Anwitha Mapes/Extender: [name] in Treatment: 2 Wound Status Wound Number: 20 Primary Etiology: Atypical Wound Location: Left, Lateral Lower Leg Wound Status: Open Wounding Event: Gradually  Appeared Comorbid History: Arrhythmia, Hypertension, Gout Date Acquired: 12/24/2022 Weeks Of Treatment: 2 Clustered Wound: No Photos Wound Measurements Length: (cm) 4.6 Width: (cm) 3.3 Depth: (cm) 0.1 Area: (cm) 11.922 Volume: (cm) 1.192 % Reduction in Area: 31% % Reduction in Volume: 31% Epithelialization: None Wound Description Classification: Full Thickness Without Exposed Support KELCIE, LOO R ([id number]) Wound Margin: Thickened Exudate Amount: Large Exudate Type: Serous Exudate Color: amber Structures Foul Odor After Cleansing: No [id number] Page 7 of 7 Slough/Fibrino Yes Wound Bed Granulation Amount: None Present (0%) Exposed Structure Necrotic Amount: Large (67-100%) Fascia Exposed: No Necrotic Quality: Adherent Slough Fat Layer (Subcutaneous Tissue) Exposed: Yes Tendon Exposed: No Muscle Exposed: No Joint Exposed: No Bone Exposed: No Treatment Notes Wound #20 (Lower Leg) Wound Laterality: Left, Lateral Cleanser Peri-Wound Care Topical Primary Dressing Hydrofera Blue Ready Transfer Foam, 2.5x2.5 (in/in) Discharge Instruction: Apply Hydrofera Blue Ready to wound bed as directed Secondary Dressing Zetuvit Plus 4x4 (in/in) Secured With Compression Wrap Urgo K2, two layer compression system, regular Compression Stockings Add-Ons Electronic Signature(s) Signed: 02/08/2023 1:06:24 PM By: [name] MSN RN CNS WTA Entered By: [name] on 02/07/2023 08:44:37 -------------------------------------------------------------------------------- Vitals Details Patient Name: Date of Service: [name] [name], [name] LBERT R. 02/07/2023 8:30 [name] M Medical Record Number: [id number] Patient Account Number: [id number] Date of Birth/Sex: Treating RN: 08-17-1946 (77 y.o. [name] Primary Care Ezme Duch: [name] Other Clinician: Referring Earsel Shouse: Treating Obrian Bulson/Extender: [name] in Treatment: 2 Vital  Signs Time Taken: 08:31 Temperature (F): 98.4 Height (in): 75 Pulse (bpm): 63 Weight (lbs): 220 Respiratory Rate (breaths/min): 16 Body Mass Index (BMI): 27.5 Blood Pressure (mmHg): 170/79 Reference Range: 80 - 120 mg / dl Electronic Signature(s) Signed: 02/08/2023 1:06:24 PM By: [name] MSN RN CNS WTA Entered By: [name] on 02/07/2023 08:34:38

## 2023-02-10 ENCOUNTER — Encounter: Payer: Self-pay | Admitting: Cardiology

## 2023-02-10 LAB — AEROBIC CULTURE W GRAM STAIN (SUPERFICIAL SPECIMEN)

## 2023-02-10 NOTE — Assessment & Plan Note (Signed)
Some sick sinus syndrome underlying is A-fib, but heart racing slow today.  Need to back down beta-blocker-reduce carvedilol to 6.25 mg twice daily.  3-day Zio patch monitor to assess heart rate responsiveness.

## 2023-02-10 NOTE — Assessment & Plan Note (Addendum)
Pretty much asymptomatic with the actual A-fib but he is truly bradycardic.  I am a little concerned that he may actually have chronotropic competence and/or prolonged pauses or worsening bradycardia and that may be consistent with sick sinus syndrome.  Certainly not tachybradycardia.  Plan: Check 3-day Zio patch monitor. Due to significant bradycardia, will reduce carvedilol dose in half and increase valsartan to double. Doing fairly well on Xarelto.  No major issues.

## 2023-02-10 NOTE — Assessment & Plan Note (Signed)
He is profoundly bradycardic as well.  Plan: Reduce carvedilol to 6.25 mg twice daily and increase Diovan to 320 mg daily. On chlorthalidone as well as Lasix. Marland Kitchen He is not really using the Lasix that frequently, would potentially consider switching to spironolactone from furosemide.

## 2023-02-10 NOTE — Assessment & Plan Note (Signed)
Seen by Dr. Lorine Bears, plans for medical management.  Treating cardiovascular risk factors-hypertension and hyperlipidemia.  Continue to recommend walking. No plans for invasive options at this time.

## 2023-02-10 NOTE — Assessment & Plan Note (Signed)
Probably as much related to poor vein drainage as anything else.  He does have some varicose veins and erythema from her venous stasis.  Continue to elevate feet when possible, wear compression stockings.   Keep walking.

## 2023-02-10 NOTE — Assessment & Plan Note (Signed)
EF back to 50 to 55% on recent echo.  Not really having true CHF symptoms.  He is on carvedilol and Diovan I am going to reduce carvedilol to 6.25 mg twice daily and increase valsartan to 320 mg daily. His standing dose of furosemide 20 mg is not requiring any additional doses.

## 2023-02-10 NOTE — Assessment & Plan Note (Signed)
Lipids look well-controlled on 10 mg rosuvastatin. No change

## 2023-02-10 NOTE — Assessment & Plan Note (Signed)
On DOAC for A-fib -tolerating Xarelto well.  No bleeding issues.  This patients CHA2DS2-VASc Score and unadjusted Ischemic Stroke Rate (% per year) is equal to 3.2 % stroke rate/year from a score of 3  Above score calculated as 1 point each if present [CHF, HTN, DM, Vascular=MI/PAD/Aortic Plaque, Age if 65-74, or Male] Above score calculated as 2 points each if present [Age > 75, or Stroke/TIA/TE]

## 2023-02-11 LAB — AEROBIC CULTURE W GRAM STAIN (SUPERFICIAL SPECIMEN)

## 2023-02-14 ENCOUNTER — Encounter: Payer: Medicare PPO | Admitting: Physician Assistant

## 2023-02-14 DIAGNOSIS — L97822 Non-pressure chronic ulcer of other part of left lower leg with fat layer exposed: Secondary | ICD-10-CM | POA: Diagnosis not present

## 2023-02-14 DIAGNOSIS — I480 Paroxysmal atrial fibrillation: Secondary | ICD-10-CM | POA: Diagnosis not present

## 2023-02-14 DIAGNOSIS — I87333 Chronic venous hypertension (idiopathic) with ulcer and inflammation of bilateral lower extremity: Secondary | ICD-10-CM | POA: Diagnosis not present

## 2023-02-14 DIAGNOSIS — I10 Essential (primary) hypertension: Secondary | ICD-10-CM | POA: Diagnosis not present

## 2023-02-14 DIAGNOSIS — I739 Peripheral vascular disease, unspecified: Secondary | ICD-10-CM | POA: Diagnosis not present

## 2023-02-14 DIAGNOSIS — I872 Venous insufficiency (chronic) (peripheral): Secondary | ICD-10-CM | POA: Diagnosis not present

## 2023-02-14 DIAGNOSIS — Z85828 Personal history of other malignant neoplasm of skin: Secondary | ICD-10-CM | POA: Diagnosis not present

## 2023-02-14 DIAGNOSIS — Z7901 Long term (current) use of anticoagulants: Secondary | ICD-10-CM | POA: Diagnosis not present

## 2023-02-14 DIAGNOSIS — I428 Other cardiomyopathies: Secondary | ICD-10-CM | POA: Diagnosis not present

## 2023-02-14 NOTE — Progress Notes (Signed)
[name] ([id number]) [id number] Page 1 of 8 Visit Report for 02/14/2023 Arrival Information Details Patient Name: Date of Service: [name] 02/14/2023 12:15 PM Medical Record Number: [id number] Patient Account Number: [id number] Date of Birth/Sex: Treating RN: [birth year] (77 y.o. [name] Primary Care Ocean Kearley: [name] Other Clinician: Referring Milburn Freeney: Treating Trei Schoch/Extender: [name] in Treatment: 3 Visit Information History Since Last Visit Added or deleted any medications: No Patient Arrived: Ambulatory Has Dressing in Place as Prescribed: Yes Arrival Time: 12:16 Pain Present Now: No Accompanied By: self Transfer Assistance: None Patient Identification Verified: Yes Secondary Verification Process Completed: Yes Patient Requires Transmission-Based Precautions: No Patient Has Alerts: Yes Patient Alerts: 12/2021 TBI [name])0.75 L)0.72 Electronic Signature(s) Signed: 02/14/2023 3:10:23 PM By: [name] MSN RN CNS WTA Entered By: [name] on 02/14/2023 12:17:00 -------------------------------------------------------------------------------- Clinic Level of Care Assessment Details Patient Name: Date of Service: [name] [name] Corner [name] 02/14/2023 12:15 PM Medical Record Number: [id number] Patient Account Number: [id number] Date of Birth/Sex: Treating RN: 03/31/1946 (77 y.o. [name] Primary Care Marvelyn Bouchillon: [name] Other Clinician: Referring Fern Asmar: Treating Cyani Kallstrom/Extender: [name] in Treatment: 3 Clinic Level of Care Assessment Items TOOL 4 Quantity Score X- 1 0 Use when only an EandM is performed on FOLLOW-UP visit ASSESSMENTS - Nursing Assessment / Reassessment X- 1 10 Reassessment of Co-morbidities (includes updates in patient status) X- 1 5 Reassessment of Adherence to Treatment Plan ASSESSMENTS - Wound and Skin [name] ssessment / Reassessment X -  Simple Wound Assessment / Reassessment - one wound 1 5 []  - 0 Complex Wound Assessment / Reassessment - multiple wounds []  - 0 Dermatologic / Skin Assessment (not related to wound area) ASSESSMENTS - Focused Assessment []  - 0 Circumferential Edema Measurements - multi extremities []  - 0 Nutritional Assessment / Counseling / Intervention []  - 0 Lower Extremity Assessment (monofilament, tuning fork, pulses) []  - 0 Peripheral Arterial Disease Assessment (using hand held doppler) ASSESSMENTS - Ostomy and/or Continence Assessment and Care []  - 0 Incontinence Assessment and Management []  - 0 Ostomy Care Assessment and Management (repouching, etc.) PROCESS - Coordination of Care X - Simple Patient / Family Education for ongoing care 1 15 []  - 0 Complex (extensive) Patient / Family Education for ongoing care [name], [name] [phone].pdf Page 2 of 8 X- 1 10 Staff obtains Consents, Records, T Results / Process Orders est []  - 0 Staff telephones HHA, Nursing Homes / Clarify orders / etc []  - 0 Routine Transfer to another Facility (non-emergent condition) []  - 0 Routine Hospital Admission (non-emergent condition) []  - 0 New Admissions / Manufacturing engineer / Ordering NPWT Apligraf, etc. , []  - 0 Emergency Hospital Admission (emergent condition) X- 1 10 Simple Discharge Coordination []  - 0 Complex (extensive) Discharge Coordination PROCESS - Special Needs []  - 0 Pediatric / Minor Patient Management []  - 0 Isolation Patient Management []  - 0 Hearing / Language / Visual special needs []  - 0 Assessment of Community assistance (transportation, [name], etc.) []  - 0 Additional assistance / Altered mentation []  - 0 Support Surface(s) Assessment (bed, cushion, seat, etc.) INTERVENTIONS - Wound Cleansing / Measurement X - Simple Wound Cleansing - one wound 1 5 []  - 0 Complex Wound Cleansing - multiple wounds X- 1 5 Wound Imaging  (photographs - any number of wounds) []  - 0 Wound Tracing (instead of photographs) X- 1 5 Simple Wound Measurement - one wound []  - 0 Complex  Wound Measurement - multiple wounds INTERVENTIONS - Wound Dressings X - Small Wound Dressing one or multiple wounds 1 10 []  - 0 Medium Wound Dressing one or multiple wounds []  - 0 Large Wound Dressing one or multiple wounds []  - 0 Application of Medications - topical []  - 0 Application of Medications - injection INTERVENTIONS - Miscellaneous []  - 0 External ear exam []  - 0 Specimen Collection (cultures, biopsies, blood, body fluids, etc.) []  - 0 Specimen(s) / Culture(s) sent or taken to Lab for analysis []  - 0 Patient Transfer (multiple staff / Michiel Sites Lift / Similar devices) []  - 0 Simple Staple / Suture removal (25 or less) []  - 0 Complex Staple / Suture removal (26 or more) []  - 0 Hypo / Hyperglycemic Management (close monitor of Blood Glucose) []  - 0 Ankle / Brachial Index (ABI) - do not check if billed separately X- 1 5 Vital Signs Has the patient been seen at the hospital within the last three years: Yes Total Score: 85 Level Of Care: New/Established - Level 3 Electronic Signature(s) Signed: 02/14/2023 3:10:23 PM By: [name] MSN RN CNS WTA Entered By: [name] on 02/14/2023 12:41:17 [name] ([id number]) [id number] Page 3 of 8 -------------------------------------------------------------------------------- Compression Therapy Details Patient Name: Date of Service: [name] [name] Corner [name] 02/14/2023 12:15 PM Medical Record Number: [id number] Patient Account Number: [id number] Date of Birth/Sex: Treating RN: November 14, 1945 (77 y.o. [name] Primary Care Dempsey Knotek: [name] Other Clinician: Referring Mickie Badders: Treating Lanyla Costello/Extender: [name] in Treatment: 3 Compression Therapy Performed for Wound Assessment: Wound #20 Left,Lateral Lower Leg Performed  By: Clinician [name], RN Compression Type: Double Layer Post Procedure Diagnosis Same as Pre-procedure Electronic Signature(s) Signed: 02/14/2023 3:10:23 PM By: [name] MSN RN CNS WTA Entered By: [name] on 02/14/2023 12:41:50 -------------------------------------------------------------------------------- Encounter Discharge Information Details Patient Name: Date of Service: [name], [name] 02/14/2023 12:15 PM Medical Record Number: [id number] Patient Account Number: [id number] Date of Birth/Sex: Treating RN: 09-Nov-1945 (77 y.o. [name] Primary Care Salathiel Ferrara: [name] Other Clinician: Referring Salma Walrond: Treating Kiannah Grunow/Extender: [name] in Treatment: 3 Encounter Discharge Information Items Discharge Condition: Stable Ambulatory Status: Ambulatory Discharge Destination: Home Transportation: Private Auto Accompanied By: self Schedule Follow-up Appointment: Yes Clinical Summary of Care: Electronic Signature(s) Signed: 02/14/2023 3:10:23 PM By: [name] MSN RN CNS WTA Entered By: [name] on 02/14/2023 12:55:43 -------------------------------------------------------------------------------- Lower Extremity Assessment Details Patient Name: Date of Service: [name] [name], [name] 02/14/2023 12:15 PM Medical Record Number: [id number] Patient Account Number: [id number] Date of Birth/Sex: Treating RN: 05-Apr-1946 (77 y.o. [name] Primary Care Eve Rey: [name] Other Clinician: Referring Jordan Caraveo: Treating Hoang Pettingill/Extender: [name] in Treatment: 3 Edema Assessment Assessed: Kyra Searles: Yes] Franne Forts: No] Edema: [Left: N] [Right: o] Calf Left: Right: Point of Measurement: 39 cm From Medial Instep 36 cm Ankle Left: Right: Point of Measurement: 11 cm From Medial Instep 27 cm Vascular Assessment Left: [id number] Page 4 of 8Right:] Pulses: Dorsalis  Pedis Palpable: [phone].pdf Page 4 of 8Yes] Electronic Signature(s) Signed: 02/14/2023 3:10:23 PM By: [name] MSN RN CNS WTA Entered By: [name] on 02/14/2023 12:30:14 -------------------------------------------------------------------------------- Multi Wound Chart Details Patient Name: Date of Service: [name], [name] 02/14/2023 12:15 PM Medical Record Number: [id number] Patient Account Number: [id number] Date of Birth/Sex: Treating RN: 1946-04-30 (77 y.o. [name] Primary Care Samier Jaco: [name] Other Clinician: Referring Qamar Aughenbaugh: Treating Promise Bushong/Extender: Larina Bras,  Loyal Buba in Treatment: 3 Vital Signs Height(in): 75 Pulse(bpm): 54 Weight(lbs): 220 Blood Pressure(mmHg): 143/89 Body Mass Index(BMI): 27.5 Temperature(F): 97.6 Respiratory Rate(breaths/min): 16 [20:Photos:] [N/[name]:N/[name]] Left, Lateral Lower Leg N/[name] N/[name] Wound Location: Gradually Appeared N/[name] N/[name] Wounding Event: Atypical N/[name] N/[name] Primary Etiology: Arrhythmia, Hypertension, Gout N/[name] N/[name] Comorbid History: 12/24/2022 N/[name] N/[name] Date Acquired: 3 N/[name] N/[name] Weeks of Treatment: Open N/[name] N/[name] Wound Status: No N/[name] N/[name] Wound Recurrence: 4.5x2.5x0.1 N/[name] N/[name] Measurements L x W x D (cm) 8.836 N/[name] N/[name] [name] (cm) : rea 0.884 N/[name] N/[name] Volume (cm) : 48.90% N/[name] N/[name] % Reduction in [name] rea: 48.80% N/[name] N/[name] % Reduction in Volume: Full Thickness Without Exposed N/[name] N/[name] Classification: Support Structures Large N/[name] N/[name] Exudate Amount: Serous N/[name] N/[name] Exudate Type: amber N/[name] N/[name] Exudate Color: Thickened N/[name] N/[name] Wound Margin: None Present (0%) N/[name] N/[name] Granulation Amount: Large (67-100%) N/[name] N/[name] Necrotic Amount: Fat Layer (Subcutaneous Tissue): Yes N/[name] N/[name] Exposed Structures: Fascia: No Tendon: No Muscle: No Joint: No Bone: No None N/[name] N/[name] Epithelialization: Treatment Notes Electronic Signature(s) Signed: 02/14/2023 3:10:23 PM By: [name] MSN RN CNS  WTA Entered By: [name] on 02/14/2023 12:39:08 [name] ([id number]) [id number] Page 5 of 8 -------------------------------------------------------------------------------- Multi-Disciplinary Care Plan Details Patient Name: Date of Service: [name] 02/14/2023 12:15 PM Medical Record Number: [id number] Patient Account Number: [id number] Date of Birth/Sex: Treating RN: 1945-12-09 (77 y.o. [name] Primary Care Kelsie Kramp: [name] Other Clinician: Referring Karizma Cheek: Treating Linsey Arteaga/Extender: [name] in Treatment: 3 Active Inactive Malignancy/Atypical Etiology Nursing Diagnoses: Knowledge deficit related to disease process and management of atypical ulcer etiology Knowledge deficit related to disease process and management of malignancy Goals: Patient/caregiver will verbalize understanding of disease process and disease management of atypical ulcer etiology Date Initiated: 01/24/2023 Target Resolution Date: 02/22/2023 Goal Status: Active Patient/caregiver will verbalize understanding of disease process and disease management of malignancy Date Initiated: 01/24/2023 Target Resolution Date: 02/22/2023 Goal Status: Active Interventions: Assess patient and family medical history for signs and symptoms of malignancy/atypical etiology upon admission Provide education on atypical ulcer etiologies Provide education on malignant ulcerations Notes: Wound/Skin Impairment Nursing Diagnoses: Impaired tissue integrity Knowledge deficit related to smoking impact on wound healing Knowledge deficit related to ulceration/compromised skin integrity Goals: Ulcer/skin breakdown will have [name] volume reduction of 30% by week 4 Date Initiated: 01/24/2023 Target Resolution Date: 02/14/2023 Goal Status: Active Ulcer/skin breakdown will have [name] volume reduction of 50% by week 8 Date Initiated: 01/24/2023 Target Resolution  Date: 03/14/2023 Goal Status: Active Ulcer/skin breakdown will have [name] volume reduction of 80% by week 12 Date Initiated: 01/24/2023 Target Resolution Date: 04/11/2023 Goal Status: Active Ulcer/skin breakdown will heal within 14 weeks Date Initiated: 01/24/2023 Target Resolution Date: 04/25/2023 Goal Status: Active Interventions: Assess patient/caregiver ability to obtain necessary supplies Assess patient/caregiver ability to perform ulcer/skin care regimen upon admission and as needed Assess ulceration(s) every visit Provide education on ulcer and skin care Treatment Activities: Referred to DME Rollyn Scialdone for dressing supplies : 01/24/2023 Skin care regimen initiated : 01/24/2023 Topical wound management initiated : 01/24/2023 Notes: Electronic Signature(s) Signed: 02/14/2023 3:10:23 PM By: [name] MSN RN CNS WTA Entered By: [name] on 02/14/2023 12:42:19 [name] ([id number]) [id number] Page 6 of 8 -------------------------------------------------------------------------------- Pain Assessment Details Patient Name: Date of Service: [name] [name] Corner [name] 02/14/2023 12:15 PM Medical Record Number: [id number] Patient Account Number: [id number] Date of Birth/Sex: Treating RN: 1946/04/24 (77 y.o. [name],  [name] Primary Care Dorleen Kissel: [name] Other Clinician: Referring Lakeyshia Tuckerman: Treating Keondra Haydu/Extender: [name] in Treatment: 3 Active Problems Location of Pain Severity and Description of Pain Patient Has Paino No Site Locations Pain Management and Medication Current Pain Management: Electronic Signature(s) Signed: 02/14/2023 3:10:23 PM By: [name] MSN RN CNS WTA Entered By: [name] on 02/14/2023 12:20:33 -------------------------------------------------------------------------------- Patient/Caregiver Education Details Patient Name: Date of Service: [name] [name] Corner [name] 5/9/2024andnbsp12:15 PM Medical  Record Number: [id number] Patient Account Number: [id number] Date of Birth/Gender: Treating RN: 1945/12/06 (77 y.o. [name] Primary Care Physician: [name] Other Clinician: Referring Physician: Treating Physician/Extender: [name] in Treatment: 3 Education Assessment Education Provided To: Patient Education Topics Provided Wound/Skin Impairment: Handouts: Caring for Your Ulcer Methods: Explain/Verbal Responses: State content correctly Electronic Signature(s) Signed: 02/14/2023 3:10:23 PM By: [name] MSN RN CNS WTA Entered By: [name] on 02/14/2023 12:55:07 [name], [name] ([id number]) [id number] Page 7 of 8 -------------------------------------------------------------------------------- Wound Assessment Details Patient Name: Date of Service: [name] [name] Corner [name] 02/14/2023 12:15 PM Medical Record Number: [id number] Patient Account Number: [id number] Date of Birth/Sex: Treating RN: [birth year] (77 y.o. [name] Primary Care Makylah Bossard: [name] Other Clinician: Referring Zaion Hreha: Treating Ayyub Krall/Extender: [name] in Treatment: 3 Wound Status Wound Number: 20 Primary Etiology: Atypical Wound Location: Left, Lateral Lower Leg Wound Status: Open Wounding Event: Gradually Appeared Comorbid History: Arrhythmia, Hypertension, Gout Date Acquired: 12/24/2022 Weeks Of Treatment: 3 Clustered Wound: No Photos Wound Measurements Length: (cm) 4.5 Width: (cm) 2.5 Depth: (cm) 0.1 Area: (cm) 8.836 Volume: (cm) 0.884 % Reduction in Area: 48.9% % Reduction in Volume: 48.8% Epithelialization: None Wound Description Classification: Full Thickness Without Exposed Support Structures Wound Margin: Thickened Exudate Amount: Large Exudate Type: Serous Exudate Color: amber Foul Odor After Cleansing: No Slough/Fibrino Yes Wound Bed Granulation Amount: None Present (0%) Exposed  Structure Necrotic Amount: Large (67-100%) Fascia Exposed: No Necrotic Quality: Adherent Slough Fat Layer (Subcutaneous Tissue) Exposed: Yes Tendon Exposed: No Muscle Exposed: No Joint Exposed: No Bone Exposed: No Treatment Notes Wound #20 (Lower Leg) Wound Laterality: Left, Lateral Cleanser Peri-Wound Care Topical Primary Dressing Hydrofera Blue Ready Transfer Foam, 2.5x2.5 (in/in) Discharge Instruction: Apply Hydrofera Blue Ready to wound bed as directed Secondary Dressing Zetuvit Plus 4x4 (in/in) Secured With Compression [name], [name] ([id number]) [phone].pdf Page 8 of 8 Urgo K2, two layer compression system, regular Compression Stockings Add-Ons Electronic Signature(s) Signed: 02/14/2023 3:10:23 PM By: [name] MSN RN CNS WTA Entered By: [name] on 02/14/2023 12:29:48 -------------------------------------------------------------------------------- Vitals Details Patient Name: Date of Service: [name], [name] 02/14/2023 12:15 PM Medical Record Number: [id number] Patient Account Number: [id number] Date of Birth/Sex: Treating RN: Dec 27, 1945 (77 y.o. [name] Primary Care Davianna Deutschman: [name] Other Clinician: Referring Kenan Moodie: Treating Adama Ivins/Extender: [name] in Treatment: 3 Vital Signs Time Taken: 12:17 Temperature (F): 97.6 Height (in): 75 Pulse (bpm): 54 Weight (lbs): 220 Respiratory Rate (breaths/min): 16 Body Mass Index (BMI): 27.5 Blood Pressure (mmHg): 143/89 Reference Range: 80 - 120 mg / dl Electronic Signature(s) Signed: 02/14/2023 3:10:23 PM By: [name] MSN RN CNS WTA Entered By: [name] on 02/14/2023 12:20:19

## 2023-02-14 NOTE — Progress Notes (Addendum)
[name] [name] [name] ([id number]) [id number] Page 1 of 9 Visit Report for 02/14/2023 Chief Complaint Document Details Patient Name: Date of Service: [name] [name] 02/14/2023 12:15 PM Medical Record Number: [id number] Patient Account Number: [id number] Date of Birth/Sex: Treating RN: Aug 26, 1946 (77 y.o. [name] [name] Primary Care Provider: Mila [name] Other Clinician: Referring Provider: Treating Provider/Extender: [name] [name] in Treatment: 3 Information Obtained from: Patient Chief Complaint Left LE Ulcer Electronic Signature(s) Signed: 02/14/2023 12:32:41 PM By: [name] Derry PA-C Entered By: [name] [name] on 02/14/2023 12:32:41 -------------------------------------------------------------------------------- HPI Details Patient Name: Date of Service: [name] [name] [name] [name] 02/14/2023 12:15 PM Medical Record Number: [id number] Patient Account Number: [id number] Date of Birth/Sex: Treating RN: 1945-11-24 (77 y.o. 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 the patient has had [name] vascular workup over 7 years ago and said everything was normal at that stage. He does not have any chronic problems except for cardiac issues which he sees [name] cardiologist in [place]. 08/15/2017 -- arterial and venous duplex studies still pending. 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 No evidence of SVT or DVT in bilateral lower extremities The patient has an appointment at the end of the month to get his arterial duplex study done 09/05/2017 -- the patient was seen at the vein and vascular office yesterday by [name] [name] ABI studies were notable for medial calcification and the toe brachial indices were normal and bilateral ankle-brachial) waveforms were normal with triphasic flow. After review of his venous studies he was not [name] candidate for laser ablation and his lymphedema was to be treated with compression stockings and lymphedema pump pumps 09/12/2017 -- had [name] low arterial study done at the [facility] -- unable to obtain reliable ABI is due to medial calcification. Bilateral toe indices were normal with the right being 1.01 and the left being 0.92 and the waveforms were triphasic bilaterally. he did get hold of 30-40 mm compression stockings but is unable to put these on. We will try and get him alternative compression stockings. 09/26/17- he is here in follow up evaluation of [name] right lower extremity ulcer;he is compliant in wearing compression stocking; ulcer almost epithelialized , anticipate healing next appointment Readmission: 11/17 point upon evaluation patient's wound currently that he is seeing [place] for today is [name] skin cancerous lesion that was cleared away by his dermatologist on the left medial calf region. He tells me that this is [name] very similar thing to what he had done previously in fact the last time he saw him in 2018 this was also what was going on at that point. Nonetheless he feels that based on what he seeing currently that this is just having [name] lot of harder time healing although it is much closer to the surface than what he is experienced in the past. He notes  that the initial removal was in June 2022 which  was this year this is now November and still has not closed. He does have some edema and definitely I think that there is some venous component to his slow healing here. Also think that we can do something better than Vaseline to try to help with getting this to clear up as quickly as possible. He does have [name] history of atrial fibrillation and is on Eliquis otherwise he really has no major medical problems that would affect wound healing. 09/07/2021 upon evaluation today patient actually appears to be doing significantly better after having wrapped him last week. Overall I think that this is making significant improvements at this time which is great news. I do not see any evidence of infection which is great news as well. No fevers, chills, nausea, vomiting, or diarrhea. 09/14/2021 upon evaluation today patient appears to be doing well with regard to his leg ulcer. He has been tolerating the dressing changes and overall I think that he is making excellent progress. I do not see any signs of active infection at this time. 09/21/2021 upon evaluation today patient actually appears to be making good progress with regard to his wound this is again measuring smaller today no debridement seems to be necessary. We have been using [name] silver collagen dressing and I think that is doing an awesome job. [name] [name] ([id number]) [id number] Page 2 of 9 09/28/2021 upon evaluation today patient appears to be doing well with regard to his leg currently. I do not see any signs of active infection at this time which is great news. No fevers, chills, nausea, vomiting, or diarrhea. I think this wound is very close to complete resolution. 10/12/2021 upon evaluation today patient actually appears to be doing awesome in regard to his leg ulcer. In fact this appears to be completely healed based on what I am seeing currently. I do not see any evidence  of active infection locally nor systemically at this time which is also great news. No fevers, chills, nausea, vomiting, or diarrhea. Readmission: 12/07/2021 upon evaluation today patient presents for readmission here in the clinic. He was discharged on 10/12/2021 is completely healed. Unfortunately this has reopened at this point and he is having continual issues with new blisters over both lower extremities. This is even worse than what we previously saw. Nonetheless we did actually check his ABIs today and it did reveal that his ABIs were 0.55 on the left and 0.57 on the right. Subsequently this is [name] definite change from his last arterial study which showed that he did have good blood flow at 1.01 on the right and 0.92 on the left and that was right at the beginning of 2019. Nonetheless based on what we see currently I do think he tolerated the 3 layer compression wrap but I do believe that we probably need to get him tested for his arterial flow in order to see where things stand and if there is something we can do there that would help prevent this from continue to be an ongoing issue. He did not utilize compression socks in the interim from when he was last here till this time. That something is probably going to need lifelong going forward as well. 3/9; patient presents for follow-up. He has no issues or complaints today. He tolerated the compression wrap well. He had ABIs with TBI's done. He denies signs of infection. 12/21/2021 upon evaluation today patient appears to be doing well with regard to the wounds on his legs. Both are showing  signs of significant improvement which is great news although I do believe some sharp debridement would be of benefit here as well. 12/28/2021 upon evaluation today patient appears to be doing well with regard to his wounds. Everything is showing signs of excellent improvement which I am very pleased about. I think that we are headed in the right direction here.  Fortunately there does not appear to be any evidence of infection which is great news there is [name] little bit of hypergranulation. 01/04/2022 upon evaluation today patient appears to be doing well with regard to his wounds 2 of them are healed 1 is almost so and the other 1 is significantly better. Overall I am extremely pleased with where we stand and I think that he is making excellent progress here. I do not see any evidence of active infection locally nor systemically at this time. 01-16-2022 upon evaluation today patient's wound on the left leg is showing signs of doing quite well. Has not completely cleared at this point but it is much improved. Fortunately I do not see any signs of infection at this time. No fevers, chills, nausea, vomiting, or diarrhea. 01-23-2022 upon evaluation today patient's wound of the left leg actually appears to be pretty much completely healed which is great news. I do not see any signs of active infection locally or systemically which is excellent. With that being said on the right leg what wound is measuring smaller the other 1 is [name] new wound that just showed up fortunately its not too bad. Has been using Xeroform here and that seems to be doing decently well which is great news. Unfortunately his blood pressure is significantly high we gave him the readings for the past 4-5 visits as well as [name] recommendation to make an appointment to go discuss this with his primary care provider patient states that he is going to look into doing this. 01-30-2022 upon evaluation today patient appears to be doing well with regard to his left leg everything appears to be healed. On the right leg the more anterior wound is healed the more medial wound that I been concerned about [name] possible skin cancer unfortunately still does not look great to me. I do believe that we should probably do [name] biopsy I have talked about it with him [name] few times I think though it is probably time to go ahead and do  this at this point. 02-09-2022 upon evaluation today patient appears to be doing well with regard to his legs. On the left this appears to be completely healed. On the right he does have 2 areas and be perfectly honest one of them is [name] skin cancer that he is going to the Mohs surgery clinic for the other seems to be healing nicely. Readmission: 08-02-2022 upon evaluation today patient appears for reevaluation here in our clinic concerning issues that he has been having with wounds over the bilateral lower extremities. I last saw him in May 2023 and at that point we had him completely healed. Unfortunately he is tells me this has broken down to some degree since that point. 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 He tells me this 1 seems to just be getting bigger and bigger not improving. Fortunately he is not having any significant  pain which is good news he does have quite [name] bit of swelling and he tells me that his fluid pills are not recommended for him to take daily but just in 3-day intervals here and there. 08-09-2022 upon evaluation today patient appears to be doing still somewhat poorly in regard to his legs although in general he does not appear to be feeling as good as he has been. Fortunately there does not appear to be any signs of infection which is good news. With that being said he is having some issues here with having and overall poor feeling in general which again is good I think going to be the biggest complicating factor. He actually seems to be coughing I do not hear any wheezing right now I did listen to his chest he did not have good airflow down low however makes me suspicious for bronchitis or even possibly pneumonia which could be part of what is going on here as well.  Fortunately I do not see any evidence of anything worsening in regard to his legs but I definitely believe that he needs to continue with the compression wraps he took them off yesterday to shower has not had anything on for 24 hours that is why his legs are so swollen today. With regard to his pathology report I did review that it showed some squamous abnormality but no signs of distinct carcinoma. With that being said it was saying that it could be adjacent to [name] squamous cell carcinoma nonetheless my suggestion is can be that we have the patient take copy of this report and give it to his Mohs surgeon in order for them to see if there is anything they feel like needs to be done further. With that being said right now I feel like the primary thing is going to be for [place] to try to get his swelling down and keep that down into that hand since he is having so much drainage I believe we can have to bring him in for dressing changes twice [name] week doing [name] nurse visit on Mondays. 11/9; since the patient was last here he spent the night in the emergency room he received IV Lasix. Also received antibiotics although he was not discharged on either 1 of these. He also saw his cardiology office who put him on regular Lasix 20 mg [previously on as needed Lasix 20 mg]. Per our intake nurse the swelling in his legs is remarkably better but he still has bilateral lower extremity wounds. He still has wounds on the bilateral lower extremities most problematically on the left lateral calf. He has been using silver alginate under 3 layer compression. 08-23-2022 upon evaluation today patient appears to be doing much better than the last time I saw him 2 weeks ago. At that point I was very concerned about how he was doing he did see Dr. Sherrie Mustache his primary care provider they got him on some blood pressure medication in general his color and overall appearance looks to be doing much improved compared to the last time I saw  him. 09-04-2022 upon evaluation today patient appears to be doing well currently in regard to his wounds. Everything is showing signs of improvement which is great news. Fortunately there does not appear to be any signs of active infection locally or systemically at this time. No fevers, chills, nausea, vomiting, or diarrhea. 09-10-2022 upon evaluation today patient appears to be doing better in regard to his wounds although the [facility] was  extremely stuck to the wound bed. Fortunately there does not appear to be any signs of infection locally or systemically at this time which is great news. No fevers, chills, nausea, vomiting, or diarrhea. 09-17-2022 upon evaluation today patient appears to be doing well currently in regard to his wounds in general. The right leg actually showing signs of excellent improvement and very pleased with where things stand in that regard. Fortunately I do not see any evidence of infection locally or systemically at this time [name] [name] [name] [name] ([id number]) [phone].pdf Page 3 of 9 which is great news. No fevers, chills, nausea, vomiting, or diarrhea. 09-24-2022 upon evaluation today patient appears to be doing well currently in regard to his wounds. Things look to be doing quite well. 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 He is seeing his dermatologist tomorrow in that regard. With that being said other than that however he seems to really be making some pretty good progress here which is good news. No fevers, chills, nausea, vomiting, or diarrhea. 12/26; the patient has 2 open wounds remaining on the left leg. One is on the left anterior mid tibia and the other is on the right lateral knee just outside of the popliteal fossa. 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 The  patient has been to see dermatology Dr. Adolphus Birchwood who apparently is making him [name] referral to the [facility]. He does not yet have an appointment 10-12-2022 upon evaluation today patient appears to be doing well currently in regard to his wound. He has been tolerating the dressing changes without complication and overall feel like we are headed in the right direction. Fortunately I do not see any signs of infection locally or systemically at this time which is great news. No fevers, chills, nausea, vomiting, or diarrhea. 10-23-2022 upon evaluation today patient appears to be doing well currently in regard to his wound. He has been tolerating the dressing changes without complication. Fortunately there does not appear to be any signs of active infection locally nor systemically which is great news and overall I am extremely pleased with where we stand currently. No fevers, chills, nausea, vomiting, or diarrhea. 10-26-2022 upon evaluation today patient appears to be doing well currently in regard to his wounds. Everything is showing signs of improvement and this is great news. Fortunately I see no evidence of active infection systemically. He does seem to be doing much better in regard to the local infection in regard to his leg. The smell is also greatly improved. Overall I am extremely happy with where we stand today. This is after just [name] few days with the antibiotic on board. 11-05-2022 upon evaluation today patient appears to be doing well currently in regard to his wounds although the wound where they performed the Mohs surgery does look [name] little bit hyper granulated I think switching to [facility] may be better for him. He voiced understanding. Fortunately there does not appear to be any evidence of active infection locally nor systemically at this time. 11-12-2022 upon evaluation today patient appears to be doing better in regard to both wounds he has been tolerating the dressing  changes without complication. There is no signs of infection and in general I think you are doing quite well. No fevers, chills, nausea, vomiting, or diarrhea. 11-20-2022 upon evaluation today  patient appears to be doing well currently in regard to his wounds. He has been tolerating the dressing changes without complication. Fortunately there does not appear to be any signs of infection at this time. No fevers, chills, nausea, vomiting, or diarrhea. 11-27-2022 upon evaluation today patient appears to be doing somewhat poorly in regard to his leg in general he has [name] lot of areas where he looks like he had some spots that popped up. There with regard to new possible blisters. In general I am actually very concerned about the fact that the wrap may be causing some irritation here. I think that we can try to not do the wrap for 1 week, and given the prescription for mupirocin ointment which I will send into the pharmacy for him. 12-04-2022 upon evaluation today patient appears to be doing well currently in regard to his wounds in fact he appears to be pretty much completely healed based on what I am seeing at this point. I do not see any signs of active infection locally nor systemically at this time which is great news. No fevers, chills, nausea, vomiting, or diarrhea. Readmission: 4-18 he unfortunately has an area on his left lateral leg that is [name] little bit different spot from where we were previously caring for that appears to be in my opinion [name] cancerous lesion. I discussed that with him today he is aware of the situation.-2024 upon evaluation patient presents for readmission here in the clinic actually last saw him December 04, 2022. With that being said previously his dermatologist had asked that if there was something the need to be addressed from [name] dermatology standpoint and specifically biopsy that we make referral back to them to allow them to do it which I am definitely happy to do. 01-31-2023  upon evaluation today patient appears to be doing well currently in regard to his wound in fact this looks better he did see Dr. Adolphus Birchwood his dermatologist yesterday and they did perform [name] biopsy. With that being said he actually looks like he is doing much better Dr. Adolphus Birchwood feels like this may not be [name] cancerous lesion which will be very good news at the same time I definitely wanted to make sure especially considering his history and the way this looks when we saw him last week. Nonetheless I am extremely pleased with the fact that he is doing so much better at this point. 02-07-2023 upon evaluation today patient appears to be doing well currently in regard to his wound from the standpoint of size it has not gotten any larger. With that being said he does have some issues here still with what appears to be potentially some infection. Wound does not look quite as good as it did last week. We are still waiting on the results for the pathology. I did put [name] call into dermatology but I have not heard anything back from them as of yet. 02-14-2023 upon evaluation today patient appears to be doing well with regard to his wound infection is definitely under control and looks much better. Fortunately I do not see any signs of systemic infection with anyone locally I think this is improved greatly. Electronic Signature(s) Signed: 02/14/2023 12:51:19 PM By: [name] Derry PA-C Entered By: [name] [name] on 02/14/2023 12:51:18 -------------------------------------------------------------------------------- Physical Exam Details Patient Name: Date of Service: [name] [name] Beam [name] 02/14/2023 12:15 PM Medical Record Number: [id number] Patient Account Number: [id number] Date of Birth/Sex: Treating RN: 1946/03/16 (77 y.o. [name] [name] Primary Care  Provider: Mila [name] Other Clinician: Referring Provider: Treating Provider/Extender: [name] [name] in Treatment: 3 Constitutional Well-nourished and  well-hydrated in no acute distress. Respiratory normal breathing without difficulty. Psychiatric this patient is able to make decisions and demonstrates good insight into disease process. Alert and Oriented x 3. pleasant and cooperative. [name] [name] [name] [name] ([id number]) [id number] Page 4 of 9 Notes Upon inspection patient's wound bed showed signs of good granulation epithelization at this point. The Levaquin is doing [name] great job taking care of the infection that we are dealing with currently and the patient seems to be doing excellent. In fact his leg in general looks to be doing very well. Electronic Signature(s) Signed: 02/14/2023 12:51:35 PM By: [name] Derry PA-C Entered By: [name] [name] on 02/14/2023 12:51:34 -------------------------------------------------------------------------------- Physician Orders Details Patient Name: Date of Service: [name] [name] [name] [name] 02/14/2023 12:15 PM Medical Record Number: [id number] Patient Account Number: [id number] Date of Birth/Sex: Treating RN: 1946/07/07 (77 y.o. [name] [name] Primary Care Provider: Mila [name] Other Clinician: Referring Provider: Treating Provider/Extender: [name] [name] in Treatment: 3 Verbal / Phone Orders: No Diagnosis Coding ICD-10 Coding Code Description [phone] Chronic venous hypertension (idiopathic) with ulcer and inflammation of bilateral lower extremity L97.822 Non-pressure chronic ulcer of other part of left lower leg with fat layer exposed I73.89 Other specified peripheral vascular diseases I48.0 Paroxysmal atrial fibrillation I10 Essential (primary) hypertension Follow-up Appointments Return Appointment in 1 week. Nurse Visit as needed Wound Treatment Wound #20 - Lower Leg Wound Laterality: Left, Lateral Prim Dressing: Hydrofera Blue Ready Transfer Foam, 2.5x2.5 (in/in) ary Discharge Instructions: Apply Hydrofera Blue Ready to wound bed as directed Secondary  Dressing: Zetuvit Plus 4x4 (in/in) Compression Wrap: Urgo K2, two layer compression system, regular Electronic Signature(s) Signed: 02/14/2023 3:10:23 PM By: [name] MSN RN CNS WTA Signed: 02/14/2023 4:04:13 PM By: [name] Derry PA-C Entered By: [name] on 02/14/2023 12:40:42 -------------------------------------------------------------------------------- Problem List Details Patient Name: Date of Service: [name] [name], [name] [name] 02/14/2023 12:15 PM Medical Record Number: [id number] Patient Account Number: [id number] Date of Birth/Sex: Treating RN: [birth year] (77 y.o. 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 02/14/2023 12:15 PM Medical Record Number: [id number] Patient Account Number: [id number] Date of Birth/Sex: Treating RN: 12-18-1945 (77 y.o. 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 the patient has had [name] vascular workup over 7 years ago and said everything was normal at that stage. He does not have any chronic problems except for cardiac issues which he sees [name] cardiologist in [place]. 08/15/2017 -- arterial and venous duplex studies still pending. 08/23/2017 -- venous reflux studies done on 08/13/2017 shows venous incompetence throughout the left lower extremity deep system and focally at the left saphenofemoral junction. 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 No evidence of SVT or DVT in bilateral lower extremities The patient has an appointment at the end of the month to get his arterial duplex study done 09/05/2017 -- the patient was seen at the vein and vascular office yesterday by [name] [name] ABI studies were notable for medial calcification and the toe brachial indices were normal and bilateral ankle-brachial) waveforms were normal with triphasic flow. After review of his venous studies he was not [name] candidate for laser ablation and his lymphedema was to be treated with compression stockings and lymphedema pump pumps 09/12/2017 -- had [name] low arterial study done at the [facility] -- unable to obtain reliable ABI is due to medial calcification. Bilateral toe indices were normal with the right being 1.01 and the left being 0.92 and the waveforms were triphasic bilaterally. he did get hold of 30-40 mm compression  stockings but is unable to put these on. We will try and get him alternative compression stockings. 09/26/17- he is here in follow up evaluation of [name] right lower extremity ulcer;he is compliant in wearing compression stocking; ulcer almost epithelialized , anticipate healing next appointment Readmission: 11/17 point upon evaluation patient's wound currently that he is seeing [place] for today is [name] skin cancerous lesion that was cleared away by his dermatologist on the left medial calf region. He tells me that this is [name] very similar thing to what he had done previously in fact the last time he saw him in 2018 this was also what was going on at that point. Nonetheless he feels that based on what he seeing currently that this is just having [name] lot of harder time healing although it is much closer to the surface than what he is experienced in the past. He notes that the initial removal was in June 2022 which was this year this is now November and still has not closed. He does have some edema and definitely I think that there is some venous component to his slow healing here. Also think that we can do something better than Vaseline to try to help with getting this to clear up as quickly as possible. He does have [name] history of atrial fibrillation and is on Eliquis otherwise he really has no major medical problems that would affect wound healing. 09/07/2021 upon evaluation today patient actually appears to be doing significantly better after having wrapped him last week. Overall I think that this is making [name] [name] [name] [name] ([id number]) [id number] Page 6 of 9 significant improvements at this time which is great news. I do not see any evidence of infection which is great news as well. No fevers, chills, nausea, vomiting, or diarrhea. 09/14/2021 upon evaluation today patient appears to be doing well with regard to his leg ulcer. He has been tolerating the dressing changes and overall I  think that he is making excellent progress. I do not see any signs of active infection at this time. 09/21/2021 upon evaluation today patient actually appears to be making good progress with regard  to his wound this is again measuring smaller today no debridement seems to be necessary. We have been using [name] silver collagen dressing and I think that is doing an awesome job. 09/28/2021 upon evaluation today patient appears to be doing well with regard to his leg currently. I do not see any signs of active infection at this time which is great news. No fevers, chills, nausea, vomiting, or diarrhea. I think this wound is very close to complete resolution. 10/12/2021 upon evaluation today patient actually appears to be doing awesome in regard to his leg ulcer. In fact this appears to be completely healed based on what I am seeing currently. I do not see any evidence of active infection locally nor systemically at this time which is also great news. No fevers, chills, nausea, vomiting, or diarrhea. Readmission: 12/07/2021 upon evaluation today patient presents for readmission here in the clinic. He was discharged on 10/12/2021 is completely healed. Unfortunately this has reopened at this point and he is having continual issues with new blisters over both lower extremities. This is even worse than what we previously saw. Nonetheless we did actually check his ABIs today and it did reveal that his ABIs were 0.55 on the left and 0.57 on the right. Subsequently this is [name] definite change from his last arterial study which showed that he did have good blood flow at 1.01 on the right and 0.92 on the left and that was right at the beginning of 2019. Nonetheless based on what we see currently I do think he tolerated the 3 layer compression wrap but I do believe that we probably need to get him tested for his arterial flow in order to see where things stand and if there is something we can do there that would help prevent  this from continue to be an ongoing issue. He did not utilize compression socks in the interim from when he was last here till this time. That something is probably going to need lifelong going forward as well. 3/9; patient presents for follow-up. He has no issues or complaints today. He tolerated the compression wrap well. He had ABIs with TBI's done. He denies signs of infection. 12/21/2021 upon evaluation today patient appears to be doing well with regard to the wounds on his legs. Both are showing signs of significant improvement which is great news although I do believe some sharp debridement would be of benefit here as well. 12/28/2021 upon evaluation today patient appears to be doing well with regard to his wounds. Everything is showing signs of excellent improvement which I am very pleased about. I think that we are headed in the right direction here. Fortunately there does not appear to be any evidence of infection which is great news there is [name] little bit of hypergranulation. 01/04/2022 upon evaluation today patient appears to be doing well with regard to his wounds 2 of them are healed 1 is almost so and the other 1 is significantly better. Overall I am extremely pleased with where we stand and I think that he is making excellent progress here. I do not see any evidence of active infection locally nor systemically at this time. 01-16-2022 upon evaluation today patient's wound on the left leg is showing signs of doing quite well. Has not completely cleared at this point but it is much improved. Fortunately I do not see any signs of infection at this time. No fevers, chills, nausea, vomiting, or diarrhea. 01-23-2022 upon evaluation today patient's wound of the  left leg actually appears to be pretty much completely healed which is great news. I do not see any signs of active infection locally or systemically which is excellent. With that being said on the right leg what wound is measuring smaller  the other 1 is [name] new wound that just showed up fortunately its not too bad. Has been using Xeroform here and that seems to be doing decently well which is great news. Unfortunately his blood pressure is significantly high we gave him the readings for the past 4-5 visits as well as [name] recommendation to make an appointment to go discuss this with his primary care provider patient states that he is going to look into doing this. 01-30-2022 upon evaluation today patient appears to be doing well with regard to his left leg everything appears to be healed. On the right leg the more anterior wound is healed the more medial wound that I been concerned about [name] possible skin cancer unfortunately still does not look great to me. I do believe that we should probably do [name] biopsy I have talked about it with him [name] few times I think though it is probably time to go ahead and do this at this point. 02-09-2022 upon evaluation today patient appears to be doing well with regard to his legs. On the left this appears to be completely healed. On the right he does have 2 areas and be perfectly honest one of them is [name] skin cancer that he is going to the Mohs surgery clinic for the other seems to be healing nicely. Readmission: 08-02-2022 upon evaluation today patient appears for reevaluation here in our clinic concerning issues that he has been having with wounds over the bilateral lower extremities. I last saw him in May 2023 and at that point we had him completely healed. Unfortunately he is tells me this has broken down to some degree since that point. 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 He tells me this 1 seems to just be getting bigger and bigger not improving. Fortunately he is not having any significant pain which is  good news he does have quite [name] bit of swelling and he tells me that his fluid pills are not recommended for him to take daily but just in 3-day intervals here and there. 08-09-2022 upon evaluation today patient appears to be doing still somewhat poorly in regard to his legs although in general he does not appear to be feeling as good as he has been. Fortunately there does not appear to be any signs of infection which is good news. With that being said he is having some issues here with having and overall poor feeling in general which again is good I think going to be the biggest complicating factor. He actually seems to be coughing I do not hear any wheezing right now I did listen to his chest he did not have good airflow down low however makes me suspicious for bronchitis or even possibly pneumonia which could be part of what is going on here as well. Fortunately I do not see any evidence of anything worsening in regard to his legs but I definitely believe that he needs to continue with the compression wraps he took them off yesterday to shower has not had  anything on for 24 hours that is why his legs are so swollen today. With regard to his pathology report I did review that it showed some squamous abnormality but no signs of distinct carcinoma. With that being said it was saying that it could be adjacent to [name] squamous cell carcinoma nonetheless my suggestion is can be that we have the patient take copy of this report and give it to his Mohs surgeon in order for them to see if there is anything they feel like needs to be done further. With that being said right now I feel like the primary thing is going to be for [place] to try to get his swelling down and keep that down into that hand since he is having so much drainage I believe we can have to bring him in for dressing changes twice [name] week doing [name] nurse visit on Mondays. 11/9; since the patient was last here he spent the night in the emergency room he  received IV Lasix. Also received antibiotics although he was not discharged on either 1 of these. He also saw his cardiology office who put him on regular Lasix 20 mg [previously on as needed Lasix 20 mg]. Per our intake nurse the swelling in his legs is remarkably better but he still has bilateral lower extremity wounds. He still has wounds on the bilateral lower extremities most problematically on the left lateral calf. He has been using silver alginate under 3 layer compression. 08-23-2022 upon evaluation today patient appears to be doing much better than the last time I saw him 2 weeks ago. At that point I was very concerned about how he was doing he did see Dr. Sherrie Mustache his primary care provider they got him on some blood pressure medication in general his color and overall appearance looks to be doing much improved compared to the last time I saw him. 09-04-2022 upon evaluation today patient appears to be doing well currently in regard to his wounds. Everything is showing signs of improvement which is great news. Fortunately there does not appear to be any signs of active infection locally or systemically at this time. No fevers, chills, nausea, vomiting, or [name] [name] [name] [name] ([id number]) [phone].pdf Page 7 of 9 diarrhea. 09-10-2022 upon evaluation today patient appears to be doing better in regard to his wounds although the [facility] was extremely stuck to the wound bed. Fortunately there does not appear to be any signs of infection locally or systemically at this time which is great news. No fevers, chills, nausea, vomiting, or diarrhea. 09-17-2022 upon evaluation today patient appears to be doing well currently in regard to his wounds in general. The right leg actually showing signs of excellent improvement and very pleased with where things stand in that regard. Fortunately I do not see any evidence of infection locally or systemically at this time which is great  news. No fevers, chills, nausea, vomiting, or diarrhea. 09-24-2022 upon evaluation today patient appears to be doing well currently in regard to his wounds. Things look to be doing quite well. 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 He is seeing his dermatologist tomorrow in that regard. With that being said other than that however he seems to really be making some pretty good progress here which is good news. No fevers, chills, nausea, vomiting, or diarrhea. 12/26; the patient has  2 open wounds remaining on the left leg. One is on the left anterior mid tibia and the other is on the right lateral knee just outside of the popliteal fossa. 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 The patient has been to see dermatology Dr. Adolphus Birchwood who apparently is making him [name] referral to the [facility] Mohs surgery center. He does not yet have an appointment 10-12-2022 upon evaluation today patient appears to be doing well currently in regard to his wound. He has been tolerating the dressing changes without complication and overall feel like we are headed in the right direction. Fortunately I do not see any signs of infection locally or systemically at this time which is great news. No fevers, chills, nausea, vomiting, or diarrhea. 10-23-2022 upon evaluation today patient appears to be doing well currently in regard to his wound. He has been tolerating the dressing changes without complication. Fortunately there does not appear to be any signs of active infection locally nor systemically which is great news and overall I am extremely pleased with where we stand currently. No fevers, chills, nausea, vomiting, or diarrhea. 10-26-2022 upon evaluation today patient appears to be doing well currently in regard to his wounds. Everything is showing signs of  improvement and this is great news. Fortunately I see no evidence of active infection systemically. He does seem to be doing much better in regard to the local infection in regard to his leg. The smell is also greatly improved. Overall I am extremely happy with where we stand today. This is after just [name] few days with the antibiotic on board. 11-05-2022 upon evaluation today patient appears to be doing well currently in regard to his wounds although the wound where they performed the Mohs surgery does look [name] little bit hyper granulated I think switching to [facility] may be better for him. He voiced understanding. Fortunately there does not appear to be any evidence of active infection locally nor systemically at this time. 11-12-2022 upon evaluation today patient appears to be doing better in regard to both wounds he has been tolerating the dressing changes without complication. There is no signs of infection and in general I think you are doing quite well. No fevers, chills, nausea, vomiting, or diarrhea. 11-20-2022 upon evaluation today patient appears to be doing well currently in regard to his wounds. He has been tolerating the dressing changes without complication. Fortunately there does not appear to be any signs of infection at this time. No fevers, chills, nausea, vomiting, or diarrhea. 11-27-2022 upon evaluation today patient appears to be doing somewhat poorly in regard to his leg in general he has [name] lot of areas where he looks like he had some spots that popped up. There with regard to new possible blisters. In general I am actually very concerned about the fact that the wrap may be causing some irritation here. I think that we can try to not do the wrap for 1 week, and given the prescription for mupirocin ointment which I will send into the pharmacy for him. 12-04-2022 upon evaluation today patient appears to be doing well currently in regard to his wounds in fact he appears to be pretty much  completely healed based on what I am seeing at this point. I do not see any signs of active infection locally nor systemically at this time which is great news. No fevers, chills, nausea, vomiting, or diarrhea. Readmission: 4-18 he unfortunately has an area on his left  lateral leg that is [name] little bit different spot from where we were previously caring for that appears to be in my opinion [name] cancerous lesion. I discussed that with him today he is aware of the situation.-2024 upon evaluation patient presents for readmission here in the clinic actually last saw him December 04, 2022. With that being said previously his dermatologist had asked that if there was something the need to be addressed from [name] dermatology standpoint and specifically biopsy that we make referral back to them to allow them to do it which I am definitely happy to do. 01-31-2023 upon evaluation today patient appears to be doing well currently in regard to his wound in fact this looks better he did see Dr. Adolphus Birchwood his dermatologist yesterday and they did perform [name] biopsy. With that being said he actually looks like he is doing much better Dr. Adolphus Birchwood feels like this may not be [name] cancerous lesion which will be very good news at the same time I definitely wanted to make sure especially considering his history and the way this looks when we saw him last week. Nonetheless I am extremely pleased with the fact that he is doing so much better at this point. 02-07-2023 upon evaluation today patient appears to be doing well currently in regard to his wound from the standpoint of size it has not gotten any larger. With that being said he does have some issues here still with what appears to be potentially some infection. Wound does not look quite as good as it did last week. We are still waiting on the results for the pathology. I did put [name] call into dermatology but I have not heard anything back from them as of yet. 02-14-2023 upon evaluation today  patient appears to be doing well with regard to his wound infection is definitely under control and looks much better. Fortunately I do not see any signs of systemic infection with anyone locally I think this is improved greatly. Objective Constitutional Well-nourished and well-hydrated in no acute distress. Vitals Time Taken: 12:17 PM, Height: 75 in, Weight: 220 lbs, BMI: 27.5, Temperature: 97.6 F, Pulse: 54 bpm, Respiratory Rate: 16 breaths/min, Blood Pressure: 143/89 mmHg. Respiratory normal breathing without difficulty. Psychiatric this patient is able to make decisions and demonstrates good insight into disease process. Alert and Oriented x 3. pleasant and cooperative. [name] [name] [name] [name] ([id number]) [id number] Page 8 of 9 General Notes: Upon inspection patient's wound bed showed signs of good granulation epithelization at this point. The Levaquin is doing [name] great job taking care of the infection that we are dealing with currently and the patient seems to be doing excellent. In fact his leg in general looks to be doing very well. Integumentary (Hair, Skin) Wound #20 status is Open. Original cause of wound was Gradually Appeared. The date acquired was: 12/24/2022. The wound has been in treatment 3 weeks. The wound is located on the Left,Lateral Lower Leg. The wound measures 4.5cm length x 2.5cm width x 0.1cm depth; 8.836cm^2 area and 0.884cm^3 volume. There is Fat Layer (Subcutaneous Tissue) exposed. There is [name] large amount of serous drainage noted. The wound margin is thickened. There is no granulation within the wound bed. There is [name] large (67-100%) amount of necrotic tissue within the wound bed including Adherent Slough. Assessment Active Problems ICD-10 Chronic venous hypertension (idiopathic) with ulcer and inflammation of bilateral lower extremity Non-pressure chronic ulcer of other part of left lower leg with fat layer exposed Other specified peripheral  vascular  diseases Paroxysmal atrial fibrillation Essential (primary) hypertension Procedures Wound #20 Pre-procedure diagnosis of Wound #20 is an Atypical located on the Left,Lateral Lower Leg . There was [name] Double Layer Compression Therapy Procedure by [name], RN. Post procedure Diagnosis Wound #20: Same as Pre-Procedure Plan Follow-up Appointments: Return Appointment in 1 week. Nurse Visit as needed WOUND #20: - Lower Leg Wound Laterality: Left, Lateral Prim Dressing: Hydrofera Blue Ready Transfer Foam, 2.5x2.5 (in/in) ary Discharge Instructions: Apply Hydrofera Blue Ready to wound bed as directed Secondary Dressing: Zetuvit Plus 4x4 (in/in) Com pression Wrap: Urgo K2, two layer compression system, regular 1. I would recommend that we have the patient continue to monitor for any signs of infection or worsening. Office if anything changes he knows contact the office let me know. 2 also can recommend we continue with the Urgo K2 compression wrap which I think is doing quite well. 3. Will continue with the [facility] Blue followed by the Zetuvit which seems to be doing [name] great job. We will see patient back for reevaluation in 1 week here in the clinic. If anything worsens or changes patient will contact our office for additional recommendations. Electronic Signature(s) Signed: 02/14/2023 12:51:59 PM By: [name] Derry PA-C Entered By: [name] [name] on 02/14/2023 12:51:59 -------------------------------------------------------------------------------- SuperBill Details Patient Name: Date of Service: [name] [name] [name] [name] 02/14/2023 Medical Record Number: [id number] Patient Account Number: [id number] Date of Birth/Sex: Treating RN: 1946/04/14 (77 y.o. [name] [name] Primary Care Provider: Mila [name] Other Clinician: Referring Provider: Treating Provider/Extender: [name] [name] in Treatment: 3 Diagnosis [address] [name] [name] [name] [name] ([id number])  [id number] Page 9 of 9 ICD-10 Codes Code Description [phone] Chronic venous hypertension (idiopathic) with ulcer and inflammation of bilateral lower extremity L97.822 Non-pressure chronic ulcer of other part of left lower leg with fat layer exposed I73.89 Other specified peripheral vascular diseases I48.0 Paroxysmal atrial fibrillation I10 Essential (primary) hypertension Facility Procedures : CPT4 Code: [id number] Description: (Facility Use Only) 29581LT - APPLY MULTLAY COMPRS LWR LT LEG Modifier: Quantity: 1 Physician Procedures : CPT4 Code Description Modifier [id number] - WC PHYS LEVEL 3 - EST PT ICD-10 Diagnosis Description 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 L97.822 Non-pressure chronic ulcer of other  part of left lower leg with fat layer exposed I73.89 Other specified peripheral vascular diseases I48.0 Paroxysmal atrial fibrillation Quantity: 1 Electronic Signature(s) Signed: 02/14/2023 12:54:58 PM By: [name] Derry PA-C Entered By: [name] [name] on 02/14/2023 12:54:57

## 2023-02-15 DIAGNOSIS — I4821 Permanent atrial fibrillation: Secondary | ICD-10-CM | POA: Diagnosis not present

## 2023-02-15 DIAGNOSIS — I10 Essential (primary) hypertension: Secondary | ICD-10-CM | POA: Diagnosis not present

## 2023-02-15 DIAGNOSIS — Z7901 Long term (current) use of anticoagulants: Secondary | ICD-10-CM | POA: Diagnosis not present

## 2023-02-15 DIAGNOSIS — R001 Bradycardia, unspecified: Secondary | ICD-10-CM | POA: Diagnosis not present

## 2023-02-15 DIAGNOSIS — I428 Other cardiomyopathies: Secondary | ICD-10-CM | POA: Diagnosis not present

## 2023-02-16 LAB — BASIC METABOLIC PANEL
BUN/Creatinine Ratio: 17 (ref 10–24)
BUN: 17 mg/dL (ref 8–27)
CO2: 26 mmol/L (ref 20–29)
Calcium: 10.1 mg/dL (ref 8.6–10.2)
Chloride: 102 mmol/L (ref 96–106)
Creatinine, Ser: 0.99 mg/dL (ref 0.76–1.27)
Glucose: 106 mg/dL — ABNORMAL HIGH (ref 70–99)
Potassium: 4.4 mmol/L (ref 3.5–5.2)
Sodium: 144 mmol/L (ref 134–144)
eGFR: 80 mL/min/{1.73_m2} (ref >59)

## 2023-02-19 ENCOUNTER — Encounter: Payer: Medicare PPO | Admitting: Physician Assistant

## 2023-02-19 DIAGNOSIS — I428 Other cardiomyopathies: Secondary | ICD-10-CM | POA: Diagnosis not present

## 2023-02-19 DIAGNOSIS — I872 Venous insufficiency (chronic) (peripheral): Secondary | ICD-10-CM | POA: Diagnosis not present

## 2023-02-19 DIAGNOSIS — L97822 Non-pressure chronic ulcer of other part of left lower leg with fat layer exposed: Secondary | ICD-10-CM | POA: Diagnosis not present

## 2023-02-19 DIAGNOSIS — I480 Paroxysmal atrial fibrillation: Secondary | ICD-10-CM | POA: Diagnosis not present

## 2023-02-19 DIAGNOSIS — Z7901 Long term (current) use of anticoagulants: Secondary | ICD-10-CM | POA: Diagnosis not present

## 2023-02-19 DIAGNOSIS — I739 Peripheral vascular disease, unspecified: Secondary | ICD-10-CM | POA: Diagnosis not present

## 2023-02-19 DIAGNOSIS — I87333 Chronic venous hypertension (idiopathic) with ulcer and inflammation of bilateral lower extremity: Secondary | ICD-10-CM | POA: Diagnosis not present

## 2023-02-19 DIAGNOSIS — Z85828 Personal history of other malignant neoplasm of skin: Secondary | ICD-10-CM | POA: Diagnosis not present

## 2023-02-19 DIAGNOSIS — I10 Essential (primary) hypertension: Secondary | ICD-10-CM | POA: Diagnosis not present

## 2023-02-26 ENCOUNTER — Encounter: Payer: Medicare PPO | Admitting: Physician Assistant

## 2023-02-26 DIAGNOSIS — I739 Peripheral vascular disease, unspecified: Secondary | ICD-10-CM | POA: Diagnosis not present

## 2023-02-26 DIAGNOSIS — Z7901 Long term (current) use of anticoagulants: Secondary | ICD-10-CM | POA: Diagnosis not present

## 2023-02-26 DIAGNOSIS — L97822 Non-pressure chronic ulcer of other part of left lower leg with fat layer exposed: Secondary | ICD-10-CM | POA: Diagnosis not present

## 2023-02-26 DIAGNOSIS — I480 Paroxysmal atrial fibrillation: Secondary | ICD-10-CM | POA: Diagnosis not present

## 2023-02-26 DIAGNOSIS — Z85828 Personal history of other malignant neoplasm of skin: Secondary | ICD-10-CM | POA: Diagnosis not present

## 2023-02-26 DIAGNOSIS — I87333 Chronic venous hypertension (idiopathic) with ulcer and inflammation of bilateral lower extremity: Secondary | ICD-10-CM | POA: Diagnosis not present

## 2023-02-26 DIAGNOSIS — I10 Essential (primary) hypertension: Secondary | ICD-10-CM | POA: Diagnosis not present

## 2023-02-26 DIAGNOSIS — I428 Other cardiomyopathies: Secondary | ICD-10-CM | POA: Diagnosis not present

## 2023-02-26 DIAGNOSIS — I872 Venous insufficiency (chronic) (peripheral): Secondary | ICD-10-CM | POA: Diagnosis not present

## 2023-03-05 DIAGNOSIS — Z7901 Long term (current) use of anticoagulants: Secondary | ICD-10-CM | POA: Diagnosis not present

## 2023-03-05 DIAGNOSIS — L97822 Non-pressure chronic ulcer of other part of left lower leg with fat layer exposed: Secondary | ICD-10-CM | POA: Diagnosis not present

## 2023-03-05 DIAGNOSIS — I428 Other cardiomyopathies: Secondary | ICD-10-CM | POA: Diagnosis not present

## 2023-03-05 DIAGNOSIS — I87333 Chronic venous hypertension (idiopathic) with ulcer and inflammation of bilateral lower extremity: Secondary | ICD-10-CM | POA: Diagnosis not present

## 2023-03-05 DIAGNOSIS — I739 Peripheral vascular disease, unspecified: Secondary | ICD-10-CM | POA: Diagnosis not present

## 2023-03-05 DIAGNOSIS — I872 Venous insufficiency (chronic) (peripheral): Secondary | ICD-10-CM | POA: Diagnosis not present

## 2023-03-05 DIAGNOSIS — Z85828 Personal history of other malignant neoplasm of skin: Secondary | ICD-10-CM | POA: Diagnosis not present

## 2023-03-05 DIAGNOSIS — I480 Paroxysmal atrial fibrillation: Secondary | ICD-10-CM | POA: Diagnosis not present

## 2023-03-05 DIAGNOSIS — I10 Essential (primary) hypertension: Secondary | ICD-10-CM | POA: Diagnosis not present

## 2023-03-06 DIAGNOSIS — M109 Gout, unspecified: Secondary | ICD-10-CM | POA: Diagnosis not present

## 2023-03-07 ENCOUNTER — Other Ambulatory Visit: Payer: Self-pay | Admitting: Family Medicine

## 2023-03-12 ENCOUNTER — Encounter: Payer: Medicare PPO | Attending: Physician Assistant | Admitting: Physician Assistant

## 2023-03-12 DIAGNOSIS — Z7901 Long term (current) use of anticoagulants: Secondary | ICD-10-CM | POA: Insufficient documentation

## 2023-03-12 DIAGNOSIS — I480 Paroxysmal atrial fibrillation: Secondary | ICD-10-CM | POA: Insufficient documentation

## 2023-03-12 DIAGNOSIS — I872 Venous insufficiency (chronic) (peripheral): Secondary | ICD-10-CM | POA: Diagnosis not present

## 2023-03-12 DIAGNOSIS — I10 Essential (primary) hypertension: Secondary | ICD-10-CM | POA: Insufficient documentation

## 2023-03-12 DIAGNOSIS — I87333 Chronic venous hypertension (idiopathic) with ulcer and inflammation of bilateral lower extremity: Secondary | ICD-10-CM | POA: Insufficient documentation

## 2023-03-12 DIAGNOSIS — Z85828 Personal history of other malignant neoplasm of skin: Secondary | ICD-10-CM | POA: Insufficient documentation

## 2023-03-12 DIAGNOSIS — I739 Peripheral vascular disease, unspecified: Secondary | ICD-10-CM | POA: Diagnosis not present

## 2023-03-12 DIAGNOSIS — I428 Other cardiomyopathies: Secondary | ICD-10-CM | POA: Diagnosis not present

## 2023-03-12 DIAGNOSIS — S81802A Unspecified open wound, left lower leg, initial encounter: Secondary | ICD-10-CM | POA: Diagnosis not present

## 2023-03-12 DIAGNOSIS — L97822 Non-pressure chronic ulcer of other part of left lower leg with fat layer exposed: Secondary | ICD-10-CM | POA: Insufficient documentation

## 2023-03-14 LAB — HM DIABETES EYE EXAM

## 2023-03-14 NOTE — Progress Notes (Signed)
SHAKEAL, [place] R ([id number]) [id number] Page 1 of 2 Visit Report for 03/05/2023 Physician Orders Details Patient Name: Date of Service: [name] 03/05/2023 11:00 A M Medical Record Number: [id number] Patient Account Number: [id number] Date of Birth/Sex: Treating RN: 1946/04/16 (77 y.o. [name] Primary Care Provider: [name] Other Clinician: Referring Provider: Treating Provider/Extender: [name] in Treatment: 5 Verbal / Phone Orders: No Diagnosis Coding Follow-up Appointments Return Appointment in 1 week. Nurse Visit as needed Edema Control - Lymphedema / Segmental Compressive Device / Other UrgoK2 - Regular Wound Treatment Wound #20 - Lower Leg Wound Laterality: Left, Lateral Topical: Triamcinolone Acetonide Cream, 0.1%, 15 (g) tube 1 x Per Week/30 Days Discharge Instructions: Apply as directed by provider. Prim Dressing: Hydrofera Blue Ready Transfer Foam, 2.5x2.5 (in/in) 1 x Per Week/30 Days ary Discharge Instructions: Apply Hydrofera Blue Ready to wound bed as directed Secondary Dressing: Zetuvit Plus 4x4 (in/in) 1 x Per Week/30 Days Compression Wrap: Urgo K2, two layer compression system, regular 1 x Per Week/30 Days Electronic Signature(s) Signed: 03/06/2023 5:03:24 PM By: Allen Derry PA-C Signed: 03/14/2023 11:10:44 AM By: [name] MSN RN CNS WTA Entered By: [name] on 03/05/2023 11:48:20 -------------------------------------------------------------------------------- SuperBill Details Patient Name: Date of Service: [name] 03/05/2023 Medical Record Number: [id number] Patient Account Number: [id number] Date of Birth/Sex: Treating RN: [birth year] (77 y.o. [name] Primary Care Provider: [name] Other Clinician: Referring Provider: Treating Provider/Extender: [name] in Treatment: 5 Diagnosis Coding ICD-10 Codes Code  Description [phone] Chronic venous hypertension (idiopathic) with ulcer and inflammation of bilateral lower extremity L97.822 Non-pressure chronic ulcer of other part of left lower leg with fat layer exposed I73.89 Other specified peripheral vascular diseases I48.0 Paroxysmal atrial fibrillation I10 Essential (primary) hypertension Facility Procedures : CPT4 Code Description: [id number] (Facility Use Only) [phone] - APPLY MULTLAY COMPRS LWR LT LEG Modifier: Quantity: 1 Electronic Signature(s) Signed: 03/06/2023 5:03:24 PM By: Alphonzo [place], [place] R ([id number]) [id number] Page 2 of 2 Signed: 03/14/2023 11:10:44 AM By: [name] MSN RN CNS WTA Entered By: [name] on 03/05/2023 11:49:03

## 2023-03-18 ENCOUNTER — Telehealth: Payer: Self-pay | Admitting: Family Medicine

## 2023-03-18 DIAGNOSIS — H3581 Retinal edema: Secondary | ICD-10-CM

## 2023-03-18 DIAGNOSIS — H3500 Unspecified background retinopathy: Secondary | ICD-10-CM

## 2023-03-18 NOTE — Telephone Encounter (Signed)
Retinopathy screening is positive. Have placed referral to retina specialist.

## 2023-03-19 ENCOUNTER — Encounter: Payer: Medicare PPO | Admitting: Physician Assistant

## 2023-03-19 DIAGNOSIS — I872 Venous insufficiency (chronic) (peripheral): Secondary | ICD-10-CM | POA: Diagnosis not present

## 2023-03-19 DIAGNOSIS — Z7901 Long term (current) use of anticoagulants: Secondary | ICD-10-CM | POA: Diagnosis not present

## 2023-03-19 DIAGNOSIS — Z85828 Personal history of other malignant neoplasm of skin: Secondary | ICD-10-CM | POA: Diagnosis not present

## 2023-03-19 DIAGNOSIS — L97822 Non-pressure chronic ulcer of other part of left lower leg with fat layer exposed: Secondary | ICD-10-CM | POA: Diagnosis not present

## 2023-03-19 DIAGNOSIS — S81802A Unspecified open wound, left lower leg, initial encounter: Secondary | ICD-10-CM | POA: Diagnosis not present

## 2023-03-19 DIAGNOSIS — I428 Other cardiomyopathies: Secondary | ICD-10-CM | POA: Diagnosis not present

## 2023-03-19 DIAGNOSIS — I87333 Chronic venous hypertension (idiopathic) with ulcer and inflammation of bilateral lower extremity: Secondary | ICD-10-CM | POA: Diagnosis not present

## 2023-03-19 DIAGNOSIS — I480 Paroxysmal atrial fibrillation: Secondary | ICD-10-CM | POA: Diagnosis not present

## 2023-03-19 DIAGNOSIS — I739 Peripheral vascular disease, unspecified: Secondary | ICD-10-CM | POA: Diagnosis not present

## 2023-03-19 DIAGNOSIS — I10 Essential (primary) hypertension: Secondary | ICD-10-CM | POA: Diagnosis not present

## 2023-03-19 NOTE — Telephone Encounter (Signed)
Patient advised.

## 2023-03-19 NOTE — Progress Notes (Addendum)
ULES, BENSON [name] ([id number]) [id number] Page 1 of 11 Visit Report for 03/19/2023 Chief Complaint Document Details Patient Name: Date of Service: [name] 03/19/2023 9:15 A M Medical Record Number: [id number] Patient Account Number: [id number] Date of Birth/Sex: Treating RN: 20-May-1946 (77 y.o. [name] Primary Care Provider: [name] Other Clinician: Referring Provider: Treating Provider/Extender: [name] in Treatment: 7 Information Obtained from: Patient Chief Complaint Left LE Ulcer Electronic Signature(s) Signed: 03/19/2023 9:49:35 AM By: Allen Derry PA-C Entered By: Allen Derry on 03/19/2023 09:49:34 -------------------------------------------------------------------------------- Debridement Details Patient Name: Date of Service: [name] N, [name] 03/19/2023 9:15 A M Medical Record Number: [id number] Patient Account Number: [id number] Date of Birth/Sex: Treating RN: [birth year] (77 y.o. [name] Primary Care Provider: [name] Other Clinician: Referring Provider: Treating Provider/Extender: [name] in Treatment: 7 Debridement Performed for Assessment: Wound #20 Left,Lateral Lower Leg Performed By: Physician Allen Derry, PA-C Debridement Type: Debridement Level of Consciousness (Pre-procedure): Awake and Alert Pre-procedure Verification/Time Out Yes - 10:03 Taken: Start Time: 10:03 Pain Control: Lidocaine 4% Topical Solution Percent of Wound Bed Debrided: 50% T Area Debrided (cm): otal 0.27 Tissue and other material debrided: Non-Viable, Skin: Dermis , Skin: Epidermis Level: Skin/Epidermis Debridement Description: Selective/Open Wound Instrument: Curette Bleeding: None Procedural Pain: 0 Post Procedural Pain: 0 Response to Treatment: Procedure was tolerated well Level of Consciousness [name], [name] ([id number])  [id number] Page 2 of 11 Level of Consciousness (Post- Awake and Alert procedure): Post Debridement Measurements of Total Wound Length: (cm) 1.4 Width: (cm) 0.5 Depth: (cm) 0.1 Volume: (cm) 0.055 Character of Wound/Ulcer Post Debridement: Stable Post Procedure Diagnosis Same as Pre-procedure Electronic Signature(s) Signed: 03/19/2023 4:52:35 PM By: [name] MSN RN CNS WTA Signed: 03/19/2023 6:26:43 PM By: Allen Derry PA-C Entered By: [name] on 03/19/2023 10:05:15 -------------------------------------------------------------------------------- HPI Details Patient Name: Date of Service: [name] Edison, [name] 03/19/2023 9:15 A M Medical Record Number: [id number] Patient Account Number: [id number] Date of Birth/Sex: Treating RN: December 06, 1945 (77 y.o. 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 the patient has had a vascular workup over 7 years ago and said everything was normal at that stage. He does not have any chronic problems except for cardiac issues which he sees a cardiologist in [place]. 08/15/2017 -- arterial and venous duplex studies still pending. 08/23/2017 -- venous reflux studies done on 08/13/2017 shows venous incompetence throughout the left lower extremity deep system and focally at the left saphenofemoral junction. 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 No  evidence of SVT or DVT in bilateral lower extremities The patient has an appointment at the end of the month to get his arterial duplex study done 09/05/2017 -- the patient was seen at the vein and vascular office yesterday by Bary Castilla. ABI studies were notable for medial calcification and the toe brachial indices were normal and bilateral ankle-brachial) waveforms were normal with triphasic flow. After review of his venous studies he was not a candidate for laser ablation and his lymphedema was to be  treated with compression stockings and lymphedema pump pumps 09/12/2017 -- had a low arterial study done at the [facility] -- unable to obtain reliable ABI is due to medial calcification. Bilateral toe indices were normal with the right being 1.01 and the left being 0.92 and the waveforms were triphasic bilaterally. he did get hold of 30-40 mm compression stockings but is unable to put these on. We will try and get him alternative compression stockings. 09/26/17- he is here in follow up evaluation of a right lower extremity ulcer;he is compliant in wearing compression stocking; ulcer almost epithelialized , anticipate healing next appointment Readmission: 11/17 point upon evaluation patient's wound currently that he is seeing [place] for today is a skin cancerous lesion that was cleared away by his dermatologist on the left medial calf region. He tells me that this is a very similar thing to what he had done previously in fact the last time he saw him in 2018 this was also what was going on at that point. Nonetheless he feels that based on what he seeing currently that this is just having a lot of harder time healing although it is much closer to the surface than what he is experienced in the past. He notes that the initial removal was in June 2022 which was this year this is now November and still has not closed. He does have some edema and definitely I think that there is some  venous component to his slow healing here. Also think that we can do something better than Vaseline to try to help with getting this to clear up as quickly as possible. He does have a history of atrial fibrillation and is on Eliquis otherwise he really has no major medical problems that would affect wound healing. 09/07/2021 upon evaluation today patient actually appears to be doing significantly better after having wrapped him last week. Overall I think that this is making significant improvements at this time which is great news. I do not see any evidence of infection which is great news as well. No fevers, chills, nausea, vomiting, or diarrhea. [name], [name] ([id number]) [id number] Page 3 of 11 09/14/2021 upon evaluation today patient appears to be doing well with regard to his leg ulcer. He has been tolerating the dressing changes and overall I think that he is making excellent progress. I do not see any signs of active infection at this time. 09/21/2021 upon evaluation today patient actually appears to be making good progress with regard to his wound this is again measuring smaller today no debridement seems to be necessary. We have been using a silver collagen dressing and I think that is doing an awesome job. 09/28/2021 upon evaluation today patient appears to be doing well with regard to his leg currently. I do not see any signs of active infection at this time which is great news. No fevers, chills, nausea, vomiting, or diarrhea. I think this wound is very close to complete resolution. 10/12/2021 upon evaluation today patient actually appears to be doing awesome in regard to his leg ulcer. In fact this appears to be completely healed based on what I am seeing currently. I do not see any evidence of active infection locally nor systemically at this time which is also great news. No fevers, chills, nausea, vomiting, or diarrhea. Readmission: 12/07/2021 upon evaluation  today patient presents for readmission here in the clinic. He was discharged on 10/12/2021 is completely healed. Unfortunately this has reopened at this point and  he is having continual issues with new blisters over both lower extremities. This is even worse than what we previously saw. Nonetheless we did actually check his ABIs today and it did reveal that his ABIs were 0.55 on the left and 0.57 on the right. Subsequently this is a definite change from his last arterial study which showed that he did have good blood flow at 1.01 on the right and 0.92 on the left and that was right at the beginning of 2019. Nonetheless based on what we see currently I do think he tolerated the 3 layer compression wrap but I do believe that we probably need to get him tested for his arterial flow in order to see where things stand and if there is something we can do there that would help prevent this from continue to be an ongoing issue. He did not utilize compression socks in the interim from when he was last here till this time. That something is probably going to need lifelong going forward as well. 3/9; patient presents for follow-up. He has no issues or complaints today. He tolerated the compression wrap well. He had ABIs with TBI's done. He denies signs of infection. 12/21/2021 upon evaluation today patient appears to be doing well with regard to the wounds on his legs. Both are showing signs of significant improvement which is great news although I do believe some sharp debridement would be of benefit here as well. 12/28/2021 upon evaluation today patient appears to be doing well with regard to his wounds. Everything is showing signs of excellent improvement which I am very pleased about. I think that we are headed in the right direction here. Fortunately there does not appear to be any evidence of infection which is great news there is a little bit of hypergranulation. 01/04/2022 upon evaluation today patient  appears to be doing well with regard to his wounds 2 of them are healed 1 is almost so and the other 1 is significantly better. Overall I am extremely pleased with where we stand and I think that he is making excellent progress here. I do not see any evidence of active infection locally nor systemically at this time. 01-16-2022 upon evaluation today patient's wound on the left leg is showing signs of doing quite well. Has not completely cleared at this point but it is much improved. Fortunately I do not see any signs of infection at this time. No fevers, chills, nausea, vomiting, or diarrhea. 01-23-2022 upon evaluation today patient's wound of the left leg actually appears to be pretty much completely healed which is great news. I do not see any signs of active infection locally or systemically which is excellent. With that being said on the right leg what wound is measuring smaller the other 1 is a new wound that just showed up fortunately its not too bad. Has been using Xeroform here and that seems to be doing decently well which is great news. Unfortunately his blood pressure is significantly high we gave him the readings for the past 4-5 visits as well as a recommendation to make an appointment to go discuss this with his primary care provider patient states that he is going to look into doing this. 01-30-2022 upon evaluation today patient appears to be doing well with regard to his left leg everything appears to be healed. On the right leg the more anterior wound is healed the more medial wound that I been concerned about a possible skin cancer unfortunately still does not  look great to me. I do believe that we should probably do a biopsy I have talked about it with him a few times I think though it is probably time to go ahead and do this at this point. 02-09-2022 upon evaluation today patient appears to be doing well with regard to his legs. On the left this appears to be completely healed. On the  right he does have 2 areas and be perfectly honest one of them is a skin cancer that he is going to the Mohs surgery clinic for the other seems to be healing nicely. Readmission: 08-02-2022 upon evaluation today patient appears for reevaluation here in our clinic concerning issues that he has been having with wounds over the bilateral lower extremities. I last saw him in May 2023 and at that point we had him completely healed. Unfortunately he is tells me this has broken down to some degree since that point. 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 He tells me this 1 seems to just be getting bigger and bigger not improving. Fortunately he is not having any significant pain which is good news he does have quite a bit of swelling and he tells me that his fluid pills are not recommended for him to take daily but just in 3-day intervals here and there. 08-09-2022 upon evaluation today patient appears to be doing still somewhat poorly in regard to his legs although in general he does not appear to be feeling as good as he has been. Fortunately there does not appear to be any signs of infection which is good news. With that being said he is having some issues here with having and overall poor feeling in general which again is good I think going to be the biggest complicating factor. He actually seems to be coughing I do not hear any wheezing right now I did listen to his chest he did not have good airflow down low however makes me suspicious for bronchitis or even possibly pneumonia which could be part of what is going on here as well. Fortunately I do not see any evidence of anything worsening in regard to his legs but I definitely believe that he needs to continue with the compression wraps he took them  off yesterday to shower has not had anything on for 24 hours that is why his legs are so swollen today. With regard to his pathology report I did review that it showed some squamous abnormality but no signs of distinct carcinoma. With that being said it was saying that it could be adjacent to a squamous cell carcinoma nonetheless my suggestion is can be that we have the patient take copy of this report and give it to his Mohs surgeon in order for them to see if there is anything they feel like needs to be done further. With that being said right now I feel like the primary thing is going to be for [place] to try to get his swelling down and keep that down into that hand since he is having so much drainage I believe we can have to bring him in for dressing changes twice a week doing a nurse visit on Mondays. 11/9; since the patient was last here he spent the night in the emergency room he  received IV Lasix. Also received antibiotics although he was not discharged on either 1 of these. He also saw his cardiology office who put him on regular Lasix 20 mg [previously on as needed Lasix 20 mg]. Per our intake nurse the swelling in his legs is remarkably better but he still has bilateral lower extremity wounds. He still has wounds on the bilateral lower extremities most problematically on the left lateral calf. He has been using silver alginate under 3 layer compression. 08-23-2022 upon evaluation today patient appears to be doing much better than the last time I saw him 2 weeks ago. At that point I was very concerned about how he was doing he did see Dr. Sherrie Mustache his primary care provider they got him on some blood pressure medication in general his color and overall appearance looks to be doing much improved compared to the last time I saw him. 09-04-2022 upon evaluation today patient appears to be doing well currently in regard to his wounds. Everything is showing signs of improvement which is great news.  Fortunately there does not appear to be any signs of active infection locally or systemically at this time. No fevers, chills, nausea, vomiting, or diarrhea. 09-10-2022 upon evaluation today patient appears to be doing better in regard to his wounds although the [facility] was extremely stuck to the wound bed. [name], [name] ([id number]) [id number] Page 4 of 11 Fortunately there does not appear to be any signs of infection locally or systemically at this time which is great news. No fevers, chills, nausea, vomiting, or diarrhea. 09-17-2022 upon evaluation today patient appears to be doing well currently in regard to his wounds in general. The right leg actually showing signs of excellent improvement and very pleased with where things stand in that regard. Fortunately I do not see any evidence of infection locally or systemically at this time which is great news. No fevers, chills, nausea, vomiting, or diarrhea. 09-24-2022 upon evaluation today patient appears to be doing well currently in regard to his wounds. Things look to be doing quite well. With that being said he did have a result unfortunately on the pathology which showed that he did have a squamous cell carcinoma noted on the biopsy sample I sent last week. He is seeing his dermatologist tomorrow in that regard. With that being said other than that however he seems to really be making some pretty good progress here which is good news. No fevers, chills, nausea, vomiting, or diarrhea. 12/26; the patient has 2 open wounds remaining on the left leg. One is on the left anterior mid tibia and the other is on the right lateral knee just outside of the popliteal fossa. 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 The patient has been to see dermatology Dr. Adolphus Birchwood who apparently is making him a referral to the [facility] Mohs surgery center. He does not yet have an  appointment 10-12-2022 upon evaluation today patient appears to be doing well currently in regard to his wound. He has been tolerating the dressing changes without complication and overall feel like we are headed in the right direction. Fortunately I do not see any signs of infection locally or systemically at this time which is great news. No fevers, chills, nausea, vomiting, or diarrhea. 10-23-2022 upon evaluation today patient appears to be doing well currently in regard to his wound. He has been tolerating the dressing changes without complication. Fortunately there does not appear to be any signs of  active infection locally nor systemically which is great news and overall I am extremely pleased with where we stand currently. No fevers, chills, nausea, vomiting, or diarrhea. 10-26-2022 upon evaluation today patient appears to be doing well currently in regard to his wounds. Everything is showing signs of improvement and this is great news. Fortunately I see no evidence of active infection systemically. He does seem to be doing much better in regard to the local infection in regard to his leg. The smell is also greatly improved. Overall I am extremely happy with where we stand today. This is after just a few days with the antibiotic on board. 11-05-2022 upon evaluation today patient appears to be doing well currently in regard to his wounds although the wound where they performed the Mohs surgery does look a little bit hyper granulated I think switching to [facility] may be better for him. He voiced understanding. Fortunately there does not appear to be any evidence of active infection locally nor systemically at this time. 11-12-2022 upon evaluation today patient appears to be doing better in regard to both wounds he has been tolerating the dressing changes without complication. There is no signs of infection and in general I think you are doing quite well. No fevers, chills, nausea, vomiting, or  diarrhea. 11-20-2022 upon evaluation today patient appears to be doing well currently in regard to his wounds. He has been tolerating the dressing changes without complication. Fortunately there does not appear to be any signs of infection at this time. No fevers, chills, nausea, vomiting, or diarrhea. 11-27-2022 upon evaluation today patient appears to be doing somewhat poorly in regard to his leg in general he has a lot of areas where he looks like he had some spots that popped up. There with regard to new possible blisters. In general I am actually very concerned about the fact that the wrap may be causing some irritation here. I think that we can try to not do the wrap for 1 week, and given the prescription for mupirocin ointment which I will send into the pharmacy for him. 12-04-2022 upon evaluation today patient appears to be doing well currently in regard to his wounds in fact he appears to be pretty much completely healed based on what I am seeing at this point. I do not see any signs of active infection locally nor systemically at this time which is great news. No fevers, chills, nausea, vomiting, or diarrhea. 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 I discussed that with him today he is aware of the situation.-2024 upon evaluation patient presents for readmission here in the clinic actually last saw him December 04, 2022. With that being said previously his dermatologist had asked that if there was something the need to be addressed from a dermatology standpoint and specifically biopsy that we make referral back to them to allow them to do it which I am definitely happy to do. 01-31-2023 upon evaluation today patient appears to be doing well currently in regard to his wound in fact this looks better he did see Dr. Adolphus Birchwood  his dermatologist yesterday and they did perform a biopsy. With that being said he actually looks like he is doing much better Dr. Adolphus Birchwood feels like this may not be a cancerous lesion which will be very good news at the same  time I definitely wanted to make sure especially considering his history and the way this looks when we saw him last week. Nonetheless I am extremely pleased with the fact that he is doing so much better at this point. 02-07-2023 upon evaluation today patient appears to be doing well currently in regard to his wound from the standpoint of size it has not gotten any larger. With that being said he does have some issues here still with what appears to be potentially some infection. Wound does not look quite as good as it did last week. We are still waiting on the results for the pathology. I did put a call into dermatology but I have not heard anything back from them as of yet. 02-14-2023 upon evaluation today patient appears to be doing well with regard to his wound infection is definitely under control and looks much better. Fortunately I do not see any signs of systemic infection with anyone locally I think this is improved greatly. 02-19-2023 upon evaluation today patient appears to be doing well currently in regard to his wound which is actually measuring smaller and looking much better. Fortunately I do not see any evidence of active infection locally nor systemically which is great news and overall I am extremely pleased with where we stand today. 02-26-2023 upon evaluation today patient appears to be doing well currently in regard to his wound. He is actually been tolerating the dressing changes without complication. Fortunately there does not appear to be any signs of active infection locally nor systemically which is great news and overall I am extremely pleased with where we stand currently. 03-12-2023 upon evaluation today patient's wound actually showed signs of excellent  improvement. I am very pleased with where we stand I do believe that we are making good progress here. I do not see any signs of active infection. 03-19-2023 upon evaluation today patient actually appears to be making excellent progress in regard to his leg and getting this closed. In fact is just a very small area that is actually open at this point. I am actually very pleased with what we are seeing today. Electronic Signature(s) Signed: 03/22/2023 8:02:08 AM By: Allen Derry PA-C Entered By: Allen Derry on 03/22/2023 08:02:08 [name], [name] ([id number]) [id number] Page 5 of 11 -------------------------------------------------------------------------------- Physical Exam Details Patient Name: Date of Service: [name] 03/19/2023 9:15 A M Medical Record Number: [id number] Patient Account Number: [id number] Date of Birth/Sex: Treating RN: 1946-03-12 (77 y.o. [name] Primary Care Provider: [name] Other Clinician: Referring Provider: Treating Provider/Extender: [name] in Treatment: 7 Constitutional Well-nourished and well-hydrated in no acute distress. Respiratory normal breathing without difficulty. Psychiatric this patient is able to make decisions and demonstrates good insight into disease process. Alert and Oriented x 3. pleasant and cooperative. Notes Upon inspection patient's wound bed actually showed signs of just a very small area of opening the majority of this is actually healing quite nicely and looks to be doing very well. I do not see any evidence of infection I performed a little bit of debridement clearway some of the necrotic debris postdebridement this looks even better. Electronic Signature(s) Signed: 03/22/2023 8:02:24 AM By: Allen Derry PA-C Entered By: Allen Derry on 03/22/2023 08:02:24 -------------------------------------------------------------------------------- Physician Orders  Details Patient Name: Date of Service: [name] N, [name] 03/19/2023 9:15 A M Medical Record Number: [id number] Patient Account Number: [id number] Date of Birth/Sex: Treating RN: 05-02-1946 (77 y.o. [name]  Primary Care Provider: [name] Other Clinician: Referring Provider: Treating Provider/Extender: [name] in Treatment: 7 Verbal / Phone Orders: No Diagnosis Coding ICD-10 Coding Code Description [phone] Chronic venous hypertension (idiopathic) with ulcer and inflammation of bilateral lower extremity L97.822 Non-pressure chronic ulcer of other part of left lower leg with fat layer exposed I73.89 Other specified peripheral vascular diseases I48.0 Paroxysmal atrial fibrillation I10 Essential (primary) hypertension [name], [name] ([id number]) [id number] Page 6 of 11 Follow-up Appointments Return Appointment in 1 week. Nurse Visit as needed Edema Control - Lymphedema / Segmental Compressive Device / Other UrgoK2 - Regular Wound Treatment Wound #20 - Lower Leg Wound Laterality: Left, Lateral Topical: A and D ointment 1 x Per Week/30 Days Prim Dressing: Hydrofera Blue Ready Transfer Foam, 2.5x2.5 (in/in) 1 x Per Week/30 Days ary Discharge Instructions: Apply Hydrofera Blue Ready to wound bed as directed Secondary Dressing: Zetuvit Plus 4x4 (in/in) 1 x Per Week/30 Days Compression Wrap: Urgo K2, two layer compression system, regular 1 x Per Week/30 Days Electronic Signature(s) Signed: 03/19/2023 4:52:35 PM By: [name] MSN RN CNS WTA Signed: 03/19/2023 6:26:43 PM By: Allen Derry PA-C Entered By: [name] on 03/19/2023 10:06:07 -------------------------------------------------------------------------------- Problem List Details Patient Name: Date of Service: [name] N, [name] 03/19/2023 9:15 A M Medical Record Number: [id number] Patient Account Number: [id number] Date of Birth/Sex: Treating  RN: 13-Mar-1946 (77 y.o. [name] Primary Care Provider: [name] Other Clinician: Referring Provider: Treating Provider/Extender: [name] in Treatment: 7 Active Problems ICD-10 Encounter Code Description Active Date MDM Diagnosis [phone] Chronic venous hypertension (idiopathic) with ulcer and inflammation of 01/24/2023 No Yes bilateral lower extremity L97.822 Non-pressure chronic ulcer of other part of left lower leg with fat layer exposed4/18/2024 No Yes I73.89 Other specified peripheral vascular diseases 01/24/2023 No Yes I48.0 Paroxysmal atrial fibrillation 01/24/2023 No Yes I10 Essential (primary) hypertension 01/24/2023 No Yes Inactive Problems JIVAN, YARMAN ([id number]) [id number] Page 7 of 11 Resolved Problems Electronic Signature(s) Signed: 03/19/2023 9:49:28 AM By: Allen Derry PA-C Entered By: Allen Derry on 03/19/2023 09:49:28 -------------------------------------------------------------------------------- Progress Note Details Patient Name: Date of Service: [name] N, [name] 03/19/2023 9:15 A M Medical Record Number: [id number] Patient Account Number: [id number] Date of Birth/Sex: Treating RN: November 19, 1945 (78 y.o. 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 the patient has had a vascular  workup over 7 years ago and said everything was normal at that stage. He does not have any chronic problems except for cardiac issues which he sees a cardiologist in [place]. 08/15/2017 -- arterial and venous duplex studies still pending. 08/23/2017 -- venous reflux studies done on 08/13/2017 shows venous incompetence throughout the left lower extremity deep system and focally at the left saphenofemoral junction. 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 No evidence of SVT or DVT in bilateral lower extremities The patient has an appointment at the end of the month to get his arterial duplex study done 09/05/2017 -- the patient was seen at the vein and  vascular office yesterday by Bary Castilla. ABI studies were notable for medial calcification and the toe brachial indices were normal and bilateral ankle-brachial) waveforms were normal with triphasic flow. After review of his venous studies he was not a candidate for laser ablation and his lymphedema was to be treated with compression stockings and lymphedema pump pumps 09/12/2017 -- had a low arterial study done at the [facility] -- unable to obtain reliable ABI is due to medial calcification. Bilateral toe indices were normal with the right being 1.01 and the left being 0.92 and the waveforms were triphasic bilaterally. he did get hold of 30-40 mm compression stockings but is unable to put these on. We will try and get him alternative compression stockings. 09/26/17- he is here in follow up evaluation of a right lower extremity ulcer;he is compliant in wearing compression stocking; ulcer almost epithelialized , anticipate healing next appointment Readmission: 11/17 point upon evaluation patient's wound currently that he is seeing [place] for today is a skin cancerous lesion that was cleared away by his dermatologist on the left medial calf region. He tells me that this is a very similar thing to what he had  done previously in fact the last time he saw him in 2018 this was also what was going on at that point. Nonetheless he feels that based on what he seeing currently that this is just having a lot of harder time healing although it is much closer to the surface than what he is experienced in the past. He notes that the initial removal was in June 2022 which was this year this is now November and still has not closed. He does have some edema and definitely I think that there is some venous component to his slow healing here. Also think that we can do something better than Vaseline to try to help with getting this to clear up as quickly as possible. He does have a history of atrial fibrillation and is on Eliquis otherwise he really has no major medical problems that would affect wound healing. 09/07/2021 upon evaluation today patient actually appears to be doing significantly better after having wrapped him last week. Overall I think that this is making significant improvements at this time which is great news. I do not see any evidence of infection which is great news as well. No fevers, chills, nausea, vomiting, or diarrhea. 09/14/2021 upon evaluation today patient appears to be doing well with regard to his leg ulcer. He has been tolerating the dressing changes and overall I think that he is making excellent progress. I do not see any signs of active infection at this time. 09/21/2021 upon evaluation today patient actually appears to be making good progress with regard to his wound this is again measuring smaller today no debridement seems to be necessary. We have been using a silver collagen dressing and I think that is doing an awesome job. 09/28/2021 upon evaluation today patient appears to be doing well with regard to his leg currently. I do not see any signs of active infection at this time which is great news. No fevers, chills, nausea, vomiting, or diarrhea. I think this wound is very close to  complete resolution. [name], [name] ([id number]) [id number] Page 8 of 11 10/12/2021 upon evaluation today patient actually appears to be doing awesome in regard to his leg ulcer. In fact this appears to be completely healed based on what I am seeing currently. I do not see any evidence of  active infection locally nor systemically at this time which is also great news. No fevers, chills, nausea, vomiting, or diarrhea. Readmission: 12/07/2021 upon evaluation today patient presents for readmission here in the clinic. He was discharged on 10/12/2021 is completely healed. Unfortunately this has reopened at this point and he is having continual issues with new blisters over both lower extremities. This is even worse than what we previously saw. Nonetheless we did actually check his ABIs today and it did reveal that his ABIs were 0.55 on the left and 0.57 on the right. Subsequently this is a definite change from his last arterial study which showed that he did have good blood flow at 1.01 on the right and 0.92 on the left and that was right at the beginning of 2019. Nonetheless based on what we see currently I do think he tolerated the 3 layer compression wrap but I do believe that we probably need to get him tested for his arterial flow in order to see where things stand and if there is something we can do there that would help prevent this from continue to be an ongoing issue. He did not utilize compression socks in the interim from when he was last here till this time. That something is probably going to need lifelong going forward as well. 3/9; patient presents for follow-up. He has no issues or complaints today. He tolerated the compression wrap well. He had ABIs with TBI's done. He denies signs of infection. 12/21/2021 upon evaluation today patient appears to be doing well with regard to the wounds on his legs. Both are showing signs of significant improvement which is great  news although I do believe some sharp debridement would be of benefit here as well. 12/28/2021 upon evaluation today patient appears to be doing well with regard to his wounds. Everything is showing signs of excellent improvement which I am very pleased about. I think that we are headed in the right direction here. Fortunately there does not appear to be any evidence of infection which is great news there is a little bit of hypergranulation. 01/04/2022 upon evaluation today patient appears to be doing well with regard to his wounds 2 of them are healed 1 is almost so and the other 1 is significantly better. Overall I am extremely pleased with where we stand and I think that he is making excellent progress here. I do not see any evidence of active infection locally nor systemically at this time. 01-16-2022 upon evaluation today patient's wound on the left leg is showing signs of doing quite well. Has not completely cleared at this point but it is much improved. Fortunately I do not see any signs of infection at this time. No fevers, chills, nausea, vomiting, or diarrhea. 01-23-2022 upon evaluation today patient's wound of the left leg actually appears to be pretty much completely healed which is great news. I do not see any signs of active infection locally or systemically which is excellent. With that being said on the right leg what wound is measuring smaller the other 1 is a new wound that just showed up fortunately its not too bad. Has been using Xeroform here and that seems to be doing decently well which is great news. Unfortunately his blood pressure is significantly high we gave him the readings for the past 4-5 visits as well as a recommendation to make an appointment to go discuss this with his primary care provider patient states that he is going to look into doing  this. 01-30-2022 upon evaluation today patient appears to be doing well with regard to his left leg everything appears to be healed.  On the right leg the more anterior wound is healed the more medial wound that I been concerned about a possible skin cancer unfortunately still does not look great to me. I do believe that we should probably do a biopsy I have talked about it with him a few times I think though it is probably time to go ahead and do this at this point. 02-09-2022 upon evaluation today patient appears to be doing well with regard to his legs. On the left this appears to be completely healed. On the right he does have 2 areas and be perfectly honest one of them is a skin cancer that he is going to the Mohs surgery clinic for the other seems to be healing nicely. Readmission: 08-02-2022 upon evaluation today patient appears for reevaluation here in our clinic concerning issues that he has been having with wounds over the bilateral lower extremities. I last saw him in May 2023 and at that point we had him completely healed. Unfortunately he is tells me this has broken down to some degree since that point. 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 He tells me this 1 seems to just be getting bigger and bigger not improving. Fortunately he is not having any significant pain which is good news he does have quite a bit of swelling and he tells me that his fluid pills are not recommended for him to take daily but just in 3-day intervals here and there. 08-09-2022 upon evaluation today patient appears to be doing still somewhat poorly in regard to his legs although in general he does not appear to be feeling as good as he has been. Fortunately there does not appear to be any signs of infection which is good news. With that being said he is having some issues here with having and overall poor feeling in general which again is good I  think going to be the biggest complicating factor. He actually seems to be coughing I do not hear any wheezing right now I did listen to his chest he did not have good airflow down low however makes me suspicious for bronchitis or even possibly pneumonia which could be part of what is going on here as well. Fortunately I do not see any evidence of anything worsening in regard to his legs but I definitely believe that he needs to continue with the compression wraps he took them off yesterday to shower has not had anything on for 24 hours that is why his legs are so swollen today. With regard to his pathology report I did review that it showed some squamous abnormality but no signs of distinct carcinoma. With that being said it was saying that it could be adjacent to a squamous cell carcinoma nonetheless my suggestion is can be that we have the patient take copy of this report and give it to his Mohs surgeon in order for them to see if there is anything they feel like needs to be done further. With that being said right now I feel like the primary thing is going to be for [place] to try to get his swelling down and keep  that down into that hand since he is having so much drainage I believe we can have to bring him in for dressing changes twice a week doing a nurse visit on Mondays. 11/9; since the patient was last here he spent the night in the emergency room he received IV Lasix. Also received antibiotics although he was not discharged on either 1 of these. He also saw his cardiology office who put him on regular Lasix 20 mg [previously on as needed Lasix 20 mg]. Per our intake nurse the swelling in his legs is remarkably better but he still has bilateral lower extremity wounds. He still has wounds on the bilateral lower extremities most problematically on the left lateral calf. He has been using silver alginate under 3 layer compression. 08-23-2022 upon evaluation today patient appears to be doing much  better than the last time I saw him 2 weeks ago. At that point I was very concerned about how he was doing he did see Dr. Sherrie Mustache his primary care provider they got him on some blood pressure medication in general his color and overall appearance looks to be doing much improved compared to the last time I saw him. 09-04-2022 upon evaluation today patient appears to be doing well currently in regard to his wounds. Everything is showing signs of improvement which is great news. Fortunately there does not appear to be any signs of active infection locally or systemically at this time. No fevers, chills, nausea, vomiting, or diarrhea. 09-10-2022 upon evaluation today patient appears to be doing better in regard to his wounds although the [facility] was extremely stuck to the wound bed. Fortunately there does not appear to be any signs of infection locally or systemically at this time which is great news. No fevers, chills, nausea, vomiting, or diarrhea. 09-17-2022 upon evaluation today patient appears to be doing well currently in regard to his wounds in general. The right leg actually showing signs of excellent improvement and very pleased with where things stand in that regard. Fortunately I do not see any evidence of infection locally or systemically at this time which is great news. No fevers, chills, nausea, vomiting, or diarrhea. 09-24-2022 upon evaluation today patient appears to be doing well currently in regard to his wounds. Things look to be doing quite well. With that being said he did have a result unfortunately on the pathology which showed that he did have a squamous cell carcinoma noted on the biopsy sample I sent last week. He is [name], [name] ([id number]) [id number] Page 9 of 11 seeing his dermatologist tomorrow in that regard. With that being said other than that however he seems to really be making some pretty good progress here which is good news. No  fevers, chills, nausea, vomiting, or diarrhea. 12/26; the patient has 2 open wounds remaining on the left leg. One is on the left anterior mid tibia and the other is on the right lateral knee just outside of the popliteal fossa. 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 The patient has been to see dermatology Dr. Adolphus Birchwood who apparently is making him a referral to the [facility] center. He does not yet have an appointment 10-12-2022 upon evaluation today patient appears to be doing well currently in regard to his wound. He has been tolerating the dressing changes without complication and overall feel like we are headed in the right direction. Fortunately I do not see any signs of infection locally or systemically  at this time which is great news. No fevers, chills, nausea, vomiting, or diarrhea. 10-23-2022 upon evaluation today patient appears to be doing well currently in regard to his wound. He has been tolerating the dressing changes without complication. Fortunately there does not appear to be any signs of active infection locally nor systemically which is great news and overall I am extremely pleased with where we stand currently. No fevers, chills, nausea, vomiting, or diarrhea. 10-26-2022 upon evaluation today patient appears to be doing well currently in regard to his wounds. Everything is showing signs of improvement and this is great news. Fortunately I see no evidence of active infection systemically. He does seem to be doing much better in regard to the local infection in regard to his leg. The smell is also greatly improved. Overall I am extremely happy with where we stand today. This is after just a few days with the antibiotic on board. 11-05-2022 upon evaluation today patient appears to be doing well currently in regard to his wounds although the wound where they performed the Mohs surgery does look a little bit hyper granulated I think  switching to [facility] may be better for him. He voiced understanding. Fortunately there does not appear to be any evidence of active infection locally nor systemically at this time. 11-12-2022 upon evaluation today patient appears to be doing better in regard to both wounds he has been tolerating the dressing changes without complication. There is no signs of infection and in general I think you are doing quite well. No fevers, chills, nausea, vomiting, or diarrhea. 11-20-2022 upon evaluation today patient appears to be doing well currently in regard to his wounds. He has been tolerating the dressing changes without complication. Fortunately there does not appear to be any signs of infection at this time. No fevers, chills, nausea, vomiting, or diarrhea. 11-27-2022 upon evaluation today patient appears to be doing somewhat poorly in regard to his leg in general he has a lot of areas where he looks like he had some spots that popped up. There with regard to new possible blisters. In general I am actually very concerned about the fact that the wrap may be causing some irritation here. I think that we can try to not do the wrap for 1 week, and given the prescription for mupirocin ointment which I will send into the pharmacy for him. 12-04-2022 upon evaluation today patient appears to be doing well currently in regard to his wounds in fact he appears to be pretty much completely healed based on what I am seeing at this point. I do not see any signs of active infection locally nor systemically at this time which is great news. No fevers, chills, nausea, vomiting, or diarrhea. 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 I discussed that with him today he is aware of the situation.-2024 upon evaluation patient presents for readmission here in the  clinic actually last saw him December 04, 2022. With that being said previously his dermatologist had asked that if there was something the need to be addressed from a dermatology standpoint and specifically biopsy that we make referral back to them to allow them to do it which I am definitely happy to do. 01-31-2023 upon evaluation today patient appears to be doing well currently in regard to his wound in fact this  looks better he did see Dr. Adolphus Birchwood his dermatologist yesterday and they did perform a biopsy. With that being said he actually looks like he is doing much better Dr. Adolphus Birchwood feels like this may not be a cancerous lesion which will be very good news at the same time I definitely wanted to make sure especially considering his history and the way this looks when we saw him last week. Nonetheless I am extremely pleased with the fact that he is doing so much better at this point. 02-07-2023 upon evaluation today patient appears to be doing well currently in regard to his wound from the standpoint of size it has not gotten any larger. With that being said he does have some issues here still with what appears to be potentially some infection. Wound does not look quite as good as it did last week. We are still waiting on the results for the pathology. I did put a call into dermatology but I have not heard anything back from them as of yet. 02-14-2023 upon evaluation today patient appears to be doing well with regard to his wound infection is definitely under control and looks much better. Fortunately I do not see any signs of systemic infection with anyone locally I think this is improved greatly. 02-19-2023 upon evaluation today patient appears to be doing well currently in regard to his wound which is actually measuring smaller and looking much better. Fortunately I do not see any evidence of active infection locally nor systemically which is great news and overall I am extremely pleased with where we  stand today. 02-26-2023 upon evaluation today patient appears to be doing well currently in regard to his wound. He is actually been tolerating the dressing changes without complication. Fortunately there does not appear to be any signs of active infection locally nor systemically which is great news and overall I am extremely pleased with where we stand currently. 03-12-2023 upon evaluation today patient's wound actually showed signs of excellent improvement. I am very pleased with where we stand I do believe that we are making good progress here. I do not see any signs of active infection. 03-19-2023 upon evaluation today patient actually appears to be making excellent progress in regard to his leg and getting this closed. In fact is just a very small area that is actually open at this point. I am actually very pleased with what we are seeing today. Objective Constitutional Well-nourished and well-hydrated in no acute distress. Vitals Time Taken: 9:43 AM, Height: 75 in, Weight: 220 lbs, BMI: 27.5, Temperature: 97.7 F, Pulse: 47 bpm, Respiratory Rate: 18 breaths/min, Blood Pressure: 177/75 mmHg. Respiratory normal breathing without difficulty. [name], [name] ([id number]) [id number] Page 10 of 11 Psychiatric this patient is able to make decisions and demonstrates good insight into disease process. Alert and Oriented x 3. pleasant and cooperative. General Notes: Upon inspection patient's wound bed actually showed signs of just a very small area of opening the majority of this is actually healing quite nicely and looks to be doing very well. I do not see any evidence of infection I performed a little bit of debridement clearway some of the necrotic debris postdebridement this looks even better. Integumentary (Hair, Skin) Wound #20 status is Open. Original cause of wound was Gradually Appeared. The date acquired was: 12/24/2022. The wound has been in treatment 7  weeks. The wound is located on the Left,Lateral Lower Leg. The wound measures 1.4cm length x 0.5cm width x 0.1cm depth; 0.55cm^2 area and  0.055cm^3 volume. There is Fat Layer (Subcutaneous Tissue) exposed. There is no tunneling or undermining noted. There is a large amount of serous drainage noted. There is medium (34-66%) red, pink granulation within the wound bed. 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 There was a Selective/Open Wound Skin/Epidermis Debridement with a total area of 0.27 sq cm performed by Allen Derry, PA-C. With the following instrument(s): Curette to remove Non-Viable tissue/material. Material removed includes Skin: Dermis and Skin: Epidermis and after achieving pain control using Lidocaine 4% T opical Solution. No specimens were taken. A time out was conducted at 10:03, prior to the start of the procedure. There was no bleeding. The procedure was tolerated well with a pain level of 0 throughout and a pain level of 0 following the procedure. Post Debridement Measurements: 1.4cm length x 0.5cm width x 0.1cm depth; 0.055cm^3 volume. Character of Wound/Ulcer Post Debridement is stable. Post procedure Diagnosis Wound #20: Same as Pre-Procedure Plan Follow-up Appointments: Return Appointment in 1 week. Nurse Visit as needed Edema Control - Lymphedema / Segmental Compressive Device / Other: UrgoK2 - Regular WOUND #20: - Lower Leg Wound Laterality: Left, Lateral Topical: A and D ointment 1 x Per Week/30 Days Prim Dressing: Hydrofera Blue Ready Transfer Foam, 2.5x2.5 (in/in) 1 x  Per Week/30 Days ary Discharge Instructions: Apply Hydrofera Blue Ready to wound bed as directed Secondary Dressing: Zetuvit Plus 4x4 (in/in) 1 x Per Week/30 Days Com pression Wrap: Urgo K2, two layer compression system, regular 1 x Per Week/30 Days 1. Based on what I am seeing I do believe that the patient is making good headway towards closure. Recommend we continue with Hydrofera Blue. 2 also to continue to monitor for any signs of infection or worsening of safety think changes to contact the office and let me know. We will see patient back for reevaluation in 1 week here in the clinic. If anything worsens or changes patient will contact our office for additional recommendations. Electronic Signature(s) Signed: 03/22/2023 9:10:15 AM By: Allen Derry PA-C Entered By: Allen Derry on 03/22/2023 09:10:14 [name], [name] ([id number]) [id number] Page 11 of 11 -------------------------------------------------------------------------------- SuperBill Details Patient Name: Date of Service: [name] 03/19/2023 Medical Record Number: [id number] Patient Account Number: [id number] Date of Birth/Sex: Treating RN: 1946/05/09 (77 y.o. [name] Primary Care Provider: [name] Other Clinician: Referring Provider: Treating Provider/Extender: [name] in Treatment: 7 Diagnosis Coding ICD-10 Codes Code Description [phone] Chronic venous hypertension (idiopathic) with ulcer and inflammation of bilateral lower extremity L97.822 Non-pressure chronic ulcer of other part of left lower leg with fat layer exposed I73.89 Other specified peripheral vascular diseases I48.0 Paroxysmal atrial fibrillation I10 Essential (primary) hypertension Facility Procedures : CPT4 Code: [id number] Description: 99213 - WOUND CARE VISIT-LEV 3 EST PT Modifier: Quantity: 1 : CPT4 Code: [id number] Description: 97597 - DEBRIDE WOUND 1ST 20 SQ CM OR <  ICD-10 Diagnosis Description L97.822 Non-pressure chronic ulcer of other part of left lower leg with fat layer expose Modifier: d Quantity: 1 Physician Procedures : CPT4 Code Description Modifier [id number] 97597 - WC PHYS DEBR WO ANESTH 20 SQ CM ICD-10 Diagnosis Description L97.822 Non-pressure chronic ulcer of other part  of left lower leg with fat layer exposed Quantity: 1 Electronic Signature(s) Signed: 03/22/2023 9:10:29 AM By: Allen Derry PA-C Previous Signature: 03/19/2023 6:23:47 PM Version By: Allen Derry PA-C Entered By: Allen Derry on 03/22/2023 09:10:28

## 2023-03-20 NOTE — Progress Notes (Signed)
MERIC, PROUT [name] ([id number]) [id number] Page 1 of 7 Visit Report for 03/19/2023 Arrival Information Details Patient Name: Date of Service: [name] 03/19/2023 9:15 [name] M Medical Record Number: [id number] Patient Account Number: [id number] Date of Birth/Sex: Treating RN: [birth year] (77 y.o. [name] Primary Care Kaityln Kallstrom: [name] Other Clinician: Referring Wiliam Cauthorn: Treating Kamdin Follett/Extender: [name] in Treatment: 7 Visit Information History Since Last Visit Added or deleted any medications: No Patient Arrived: Ambulatory Any new allergies or adverse reactions: No Arrival Time: 09:30 Had [name] fall or experienced change in No Accompanied By: self activities of daily living that may affect Transfer Assistance: None risk of falls: Patient Identification Verified: Yes Hospitalized since last visit: No Secondary Verification Process Completed: Yes Has Dressing in Place as Prescribed: Yes Patient Requires Transmission-Based Precautions: No Has Compression in Place as Prescribed: Yes Patient Has Alerts: Yes Pain Present Now: No Patient Alerts: 12/2021 TBI [name])0.75 L)0.72 Electronic Signature(s) Signed: 03/19/2023 4:52:35 PM By: [name] MSN RN CNS WTA Entered By: [name] on 03/19/2023 09:55:59 -------------------------------------------------------------------------------- Clinic Level of Care Assessment Details Patient Name: Date of Service: [name] [facility] [name] 03/19/2023 9:15 [name] M Medical Record Number: [id number] Patient Account Number: [id number] Date of Birth/Sex: Treating RN: 07-17-1946 (77 y.o. [name] Primary Care Marlina Cataldi: [name] Other Clinician: Referring Michaelina Blandino: Treating Tashe Purdon/Extender: [name] in Treatment: 7 Clinic Level of Care Assessment Items TOOL 4 Quantity Score X- 1 0 Use when only an EandM is performed on FOLLOW-UP visit ASSESSMENTS -  Nursing Assessment / Reassessment X- 1 10 Reassessment of Co-morbidities (includes updates in patient status) X- 1 5 Reassessment of Adherence to Treatment Plan ASSESSMENTS - Wound and Skin [name] ssessment / Reassessment X - Simple Wound Assessment / Reassessment - one wound 1 5 []  - 0 Complex Wound Assessment / Reassessment - multiple wounds []  - 0 Dermatologic / Skin Assessment (not related to wound area) ASSESSMENTS - Focused Assessment []  - 0 Circumferential Edema Measurements - multi extremities []  - 0 Nutritional Assessment / Counseling / Intervention []  - 0 Lower Extremity Assessment (monofilament, tuning fork, pulses) []  - 0 Peripheral Arterial Disease Assessment (using hand held doppler) ASSESSMENTS - Ostomy and/or Continence Assessment and Care []  - 0 Incontinence Assessment and Management []  - 0 Ostomy Care Assessment and Management (repouching, etc.) PROCESS - Coordination of Care X - Simple Patient / Family Education for ongoing care 1 15 []  - 0 Complex (extensive) Patient / Family Education for ongoing care [name], [name] ([id number]) [phone].pdf Page 2 of 7 X- 1 10 Staff obtains Consents, Records, T Results / Process Orders est []  - 0 Staff telephones HHA, Nursing Homes / Clarify orders / etc []  - 0 Routine Transfer to another Facility (non-emergent condition) []  - 0 Routine Hospital Admission (non-emergent condition) []  - 0 New Admissions / Manufacturing engineer / Ordering NPWT Apligraf, etc. , []  - 0 Emergency Hospital Admission (emergent condition) X- 1 10 Simple Discharge Coordination []  - 0 Complex (extensive) Discharge Coordination PROCESS - Special Needs []  - 0 Pediatric / Minor Patient Management []  - 0 Isolation Patient Management []  - 0 Hearing / Language / Visual special needs []  - 0 Assessment of Community assistance (transportation, [name], etc.) []  - 0 Additional assistance / Altered mentation []  -  0 Support Surface(s) Assessment (bed, cushion, seat, etc.) INTERVENTIONS - Wound Cleansing / Measurement X - Simple Wound Cleansing - one wound 1 5 []  -  0 Complex Wound Cleansing - multiple wounds X- 1 5 Wound Imaging (photographs - any number of wounds) []  - 0 Wound Tracing (instead of photographs) X- 1 5 Simple Wound Measurement - one wound []  - 0 Complex Wound Measurement - multiple wounds INTERVENTIONS - Wound Dressings X - Small Wound Dressing one or multiple wounds 1 10 []  - 0 Medium Wound Dressing one or multiple wounds []  - 0 Large Wound Dressing one or multiple wounds []  - 0 Application of Medications - topical []  - 0 Application of Medications - injection INTERVENTIONS - Miscellaneous []  - 0 External ear exam []  - 0 Specimen Collection (cultures, biopsies, blood, body fluids, etc.) []  - 0 Specimen(s) / Culture(s) sent or taken to Lab for analysis []  - 0 Patient Transfer (multiple staff / Nurse, adult / Similar devices) []  - 0 Simple Staple / Suture removal (25 or less) []  - 0 Complex Staple / Suture removal (26 or more) []  - 0 Hypo / Hyperglycemic Management (close monitor of Blood Glucose) []  - 0 Ankle / Brachial Index (ABI) - do not check if billed separately X- 1 5 Vital Signs Has the patient been seen at the hospital within the last three years: Yes Total Score: 85 Level Of Care: New/Established - Level 3 Electronic Signature(s) Signed: 03/19/2023 4:52:35 PM By: [name] MSN RN CNS WTA Entered By: [name] on 03/19/2023 10:00:48 [name] ([id number]) [id number] Page 3 of 7 -------------------------------------------------------------------------------- Encounter Discharge Information Details Patient Name: Date of Service: [name] [name] 03/19/2023 9:15 [name] M Medical Record Number: [id number] Patient Account Number: [id number] Date of Birth/Sex: Treating RN: [birth year] (77 y.o. [name] Primary Care  Eilah Common: [name] Other Clinician: Referring Jaidah Lomax: Treating Zamariah Seaborn/Extender: [name] in Treatment: 7 Encounter Discharge Information Items Post Procedure Vitals Discharge Condition: Stable Temperature (F): 97.7 Ambulatory Status: Ambulatory Pulse (bpm): 47 Discharge Destination: Home Respiratory Rate (breaths/min): 18 Transportation: Private Auto Blood Pressure (mmHg): 177/75 Accompanied By: self Schedule Follow-up Appointment: Yes Clinical Summary of Care: Electronic Signature(s) Signed: 03/19/2023 4:52:35 PM By: [name] MSN RN CNS WTA Entered By: [name] on 03/19/2023 10:18:38 -------------------------------------------------------------------------------- Lower Extremity Assessment Details Patient Name: Date of Service: [name] [name], [name] 03/19/2023 9:15 [name] M Medical Record Number: [id number] Patient Account Number: [id number] Date of Birth/Sex: Treating RN: 12-17-1945 (77 y.o. [name] Primary Care Gabrella Stroh: [name] Other Clinician: Referring Charmika Macdonnell: Treating Caelan Atchley/Extender: [name] in Treatment: 7 Edema Assessment Assessed: [name]: Yes] [name]: No] [Left: Edema] [Right: :] Calf Left: Right: Point of Measurement: 33 cm From Medial Instep 32.4 cm Ankle Left: Right: Point of Measurement: 12 cm From Medial Instep 22.2 cm Vascular Assessment Pulses: Dorsalis Pedis Palpable: [Left:Yes] Electronic Signature(s) Signed: 03/19/2023 4:39:57 PM By: Angelina Pih Signed: 03/19/2023 4:52:35 PM By: [name] MSN RN CNS WTA Entered By: [name] on 03/19/2023 09:56:16 -------------------------------------------------------------------------------- Multi Wound Chart Details Patient Name: Date of Service: [name] [name], [name] 03/19/2023 9:15 [name] M Medical Record Number: [id number] Patient Account Number: [id number] Date of Birth/Sex: Treating RN: [birth year] (77 y.o. [name] Primary Care Nichael Ehly: [name] Other Clinician: Referring Roseland Braun: Treating Ragen Laver/Extender: [name] in Treatment: [address], [place] [name] ([id number]) [id number] Page 4 of 7 Vital Signs Height(in): 75 Pulse(bpm): 47 Weight(lbs): 220 Blood Pressure(mmHg): 177/75 Body Mass Index(BMI): 27.5 Temperature(F): 97.7 Respiratory Rate(breaths/min): 18 [20:Photos:] [[name]:[name]] Left, Lateral Lower Leg [name] Wound Location: Gradually Appeared  [name] Wounding Event: Atypical [name] Primary Etiology: Arrhythmia, Hypertension, Gout [name] Comorbid History: 12/24/2022 [name] Date Acquired: 7 [name] Weeks of Treatment: Open [name] Wound Status: No [name] Wound Recurrence: 1.4x0.5x0.1 [name] Measurements L x W x D (cm) 0.55 [name] [name] (cm) : rea 0.055 [name] Volume (cm) : 96.80% [name] % Reduction in [name] rea: 96.80% [name] % Reduction in Volume: Full Thickness Without Exposed [name] Classification: Support Structures Large [name] Exudate Amount: Serous [name] Exudate Type: amber [name] Exudate Color: Medium (34-66%) [name] Granulation Amount: Red, Pink [name] Granulation Quality: Fat Layer (Subcutaneous Tissue): Yes [name] Exposed Structures: Fascia: No Tendon: No Muscle: No Joint: No Bone: No Medium (34-66%) [name] Epithelialization: Treatment Notes Electronic Signature(s) Signed: 03/19/2023 10:00:02 AM By: [name] MSN RN CNS WTA Entered By: [name] on 03/19/2023 10:00:01 -------------------------------------------------------------------------------- Multi-Disciplinary Care Plan Details Patient Name: Date of Service: [name] [name], [name] 03/19/2023 9:15 [name] M Medical Record Number: [id number] Patient Account Number: [id number] Date of Birth/Sex: Treating RN: 06/13/1946 (77 y.o. [name] Primary Care Tamir Wallman: [name] Other Clinician: Referring Venesha Petraitis: Treating  Vondell Sowell/Extender: [name] in Treatment: 7 Active Inactive Wound/Skin Impairment Nursing Diagnoses: Impaired tissue integrity Knowledge deficit related to smoking impact on wound healing Knowledge deficit related to ulceration/compromised skin integrity Goals: [name], [name] [id number] Page 5 of 7 Ulcer/skin breakdown will have [name] volume reduction of 30% by week 4 Date Initiated: 01/24/2023 Date Inactivated: 03/12/2023 Target Resolution Date: 02/14/2023 Goal Status: Met Ulcer/skin breakdown will have [name] volume reduction of 50% by week 8 Date Initiated: 01/24/2023 Date Inactivated: 03/19/2023 Target Resolution Date: 03/14/2023 Goal Status: Met Ulcer/skin breakdown will have [name] volume reduction of 80% by week 12 Date Initiated: 01/24/2023 Target Resolution Date: 04/11/2023 Goal Status: Active Ulcer/skin breakdown will heal within 14 weeks Date Initiated: 01/24/2023 Target Resolution Date: 04/25/2023 Goal Status: Active Interventions: Assess patient/caregiver ability to obtain necessary supplies Assess patient/caregiver ability to perform ulcer/skin care regimen upon admission and as needed Assess ulceration(s) every visit Provide education on ulcer and skin care Treatment Activities: Referred to DME Jadene Stemmer for dressing supplies : 01/24/2023 Skin care regimen initiated : 01/24/2023 Topical wound management initiated : 01/24/2023 Notes: Electronic Signature(s) Signed: 03/19/2023 10:01:09 AM By: [name] MSN RN CNS WTA Entered By: [name] on 03/19/2023 10:01:09 -------------------------------------------------------------------------------- Pain Assessment Details Patient Name: Date of Service: [name] [name], [name] 03/19/2023 9:15 [name] M Medical Record Number: [id number] Patient Account Number: [id number] Date of Birth/Sex: Treating RN: 24-Dec-1945 (77 y.o. [name] Primary Care Iretha Kirley: [name] Other  Clinician: Referring Rondale Nies: Treating Chianti Goh/Extender: [name] in Treatment: 7 Active Problems Location of Pain Severity and Description of Pain Patient Has Paino No Site Locations Rate the pain. Current Pain Level: 0 Pain Management and Medication Current Pain Management: Electronic Signature(s) Signed: 03/19/2023 4:39:57 PM By: Angelina Pih Signed: 03/19/2023 4:52:35 PM By: [name] MSN RN CNS WTA Entered By: [name] on 03/19/2023 09:56:08 [name], [name] ([id number]) [id number] Page 6 of 7 -------------------------------------------------------------------------------- Patient/Caregiver Education Details Patient Name: Date of Service: [name] 6/11/2024andnbsp9:15 [name] M Medical Record Number: [id number] Patient Account Number: [id number] Date of Birth/Gender: Treating RN: [birth year] (77 y.o. [name] Primary Care Physician: [name] Other Clinician: Referring Physician: Treating Physician/Extender: [name] in Treatment: 7 Education Assessment Education Provided  To: Patient Education Topics Provided Wound/Skin Impairment: Handouts: Caring for Your Ulcer Methods: Explain/Verbal Responses: State content correctly Electronic Signature(s) Signed: 03/19/2023 4:52:35 PM By: [name] MSN RN CNS WTA Entered By: [name] on 03/19/2023 10:17:24 -------------------------------------------------------------------------------- Wound Assessment Details Patient Name: Date of Service: [name] [name], [name] 03/19/2023 9:15 [name] M Medical Record Number: [id number] Patient Account Number: [id number] Date of Birth/Sex: Treating RN: August 11, 1946 (77 y.o. [name] Primary Care Floyce Bujak: [name] Other Clinician: Referring Kiran Lapine: Treating Taylon Coole/Extender: [name] in Treatment: 7 Wound Status Wound Number: 20 Primary Etiology:  Atypical Wound Location: Left, Lateral Lower Leg Wound Status: Open Wounding Event: Gradually Appeared Comorbid History: Arrhythmia, Hypertension, Gout Date Acquired: 12/24/2022 Weeks Of Treatment: 7 Clustered Wound: No Photos Wound Measurements Length: (cm) 1.4 Width: (cm) 0.5 Depth: (cm) 0.1 Area: (cm) 0.55 Volume: (cm) 0.055 % Reduction in Area: 96.8% % Reduction in Volume: 96.8% Epithelialization: Medium (34-66%) Tunneling: No Undermining: No Wound Description Classification: Full Thickness Without Exposed Support Structures [name], [name] ([id number]) Exudate Amount: Large Exudate Type: Serous Exudate Color: amber [phone].pdf Page 7 of 7 Wound Bed Granulation Amount: Medium (34-66%) Exposed Structure Granulation Quality: Red, Pink Fascia Exposed: No Fat Layer (Subcutaneous Tissue) Exposed: Yes Tendon Exposed: No Muscle Exposed: No Joint Exposed: No Bone Exposed: No Treatment Notes Wound #20 (Lower Leg) Wound Laterality: Left, Lateral Cleanser Peri-Wound Care Topical [name] and D ointment Primary Dressing Hydrofera Blue Ready Transfer Foam, 2.5x2.5 (in/in) Discharge Instruction: Apply Hydrofera Blue Ready to wound bed as directed Secondary Dressing ABD Pad 5x9 (in/in) Discharge Instruction: Cover with ABD pad Secured With Compression Wrap Urgo K2, two layer compression system, regular Compression Stockings Add-Ons Electronic Signature(s) Signed: 03/19/2023 4:39:57 PM By: Angelina Pih Entered By: Angelina Pih on 03/19/2023 09:54:11 -------------------------------------------------------------------------------- Vitals Details Patient Name: Date of Service: [name] [name], [name] 03/19/2023 9:15 [name] M Medical Record Number: [id number] Patient Account Number: [id number] Date of Birth/Sex: Treating RN: Nov 20, 1945 (77 y.o. [name] Primary Care Nahara Dona: [name] Other Clinician: Referring Betzaida Cremeens: Treating  Davidson Palmieri/Extender: [name] in Treatment: 7 Vital Signs Time Taken: 09:43 Temperature (F): 97.7 Height (in): 75 Pulse (bpm): 47 Weight (lbs): 220 Respiratory Rate (breaths/min): 18 Body Mass Index (BMI): 27.5 Blood Pressure (mmHg): 177/75 Reference Range: 80 - 120 mg / dl Electronic Signature(s) Signed: 03/19/2023 4:52:35 PM By: [name] MSN RN CNS WTA Entered By: [name] on 03/19/2023 09:56:03

## 2023-03-26 ENCOUNTER — Encounter: Payer: Medicare PPO | Admitting: Physician Assistant

## 2023-03-26 DIAGNOSIS — I480 Paroxysmal atrial fibrillation: Secondary | ICD-10-CM | POA: Diagnosis not present

## 2023-03-26 DIAGNOSIS — Z7901 Long term (current) use of anticoagulants: Secondary | ICD-10-CM | POA: Diagnosis not present

## 2023-03-26 DIAGNOSIS — I739 Peripheral vascular disease, unspecified: Secondary | ICD-10-CM | POA: Diagnosis not present

## 2023-03-26 DIAGNOSIS — L97822 Non-pressure chronic ulcer of other part of left lower leg with fat layer exposed: Secondary | ICD-10-CM | POA: Diagnosis not present

## 2023-03-26 DIAGNOSIS — I428 Other cardiomyopathies: Secondary | ICD-10-CM | POA: Diagnosis not present

## 2023-03-26 DIAGNOSIS — I10 Essential (primary) hypertension: Secondary | ICD-10-CM | POA: Diagnosis not present

## 2023-03-26 DIAGNOSIS — I872 Venous insufficiency (chronic) (peripheral): Secondary | ICD-10-CM | POA: Diagnosis not present

## 2023-03-26 DIAGNOSIS — Z85828 Personal history of other malignant neoplasm of skin: Secondary | ICD-10-CM | POA: Diagnosis not present

## 2023-03-26 DIAGNOSIS — I87333 Chronic venous hypertension (idiopathic) with ulcer and inflammation of bilateral lower extremity: Secondary | ICD-10-CM | POA: Diagnosis not present

## 2023-03-26 NOTE — Progress Notes (Addendum)
[name], [name] ([id number]) [id number] Page 1 of 10 Visit Report for 03/26/2023 Chief Complaint Document Details Patient Name: Date of Service: [name] 03/26/2023 9:15 A M Medical Record Number: [id number] Patient Account Number: [id number] Date of Birth/Sex: Treating RN: November 02, 1945 (77 y.o. [name] Primary Care Provider: [name] Other Clinician: Referring Provider: Treating Provider/Extender: [name] in Treatment: 8 Information Obtained from: Patient Chief Complaint Left LE Ulcer Electronic Signature(s) Signed: 03/26/2023 9:21:53 AM By: Allen Derry PA-C Entered By: Allen Derry on 03/26/2023 09:21:53 -------------------------------------------------------------------------------- HPI Details Patient Name: Date of Service: [name] 03/26/2023 9:15 A M Medical Record Number: [id number] Patient Account Number: [id number] Date of Birth/Sex: Treating RN: April 27, 1946 (77 y.o. [name] Primary Care Provider: [name] Other Clinician: Referring Provider: Treating Provider/Extender: [name] in Treatment: 8 History of Present Illness HPI Description: 77 year old male who has a past medical history of essential hypertension, chronic atrial fibrillation, peripheral vascular disease, nonischemic cardiomyopathy,venous stasis dermatitis, gouty arthropathy, basal cell carcinoma of the right lower extremity, benign prostatic hypertrophy, long- term use of anticoagulation therapy, hyperglycemia and exercise intolerance has never been a smoker. the patient has had a vascular workup over 7 years ago and said everything was normal at that stage. He does not have any chronic problems except for cardiac issues which he sees a cardiologist in [place]. 08/15/2017 -- arterial and venous duplex studies still pending. 08/23/2017 -- venous reflux studies done on 08/13/2017 shows  venous incompetence throughout the left lower extremity deep system and focally at the left saphenofemoral junction. 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 No evidence of SVT or DVT in bilateral lower extremities The patient has an appointment at the end of the month to get his arterial duplex study done 09/05/2017 -- the patient was seen at the vein and vascular office yesterday by Bary Castilla. ABI studies were notable for medial calcification and the toe brachial indices were normal and bilateral ankle-brachial) waveforms were normal with triphasic flow. After review of his venous studies he was not a candidate for laser ablation and his lymphedema was to be treated with compression stockings and lymphedema pump pumps 09/12/2017 -- had a low arterial study done at the [facility] -- unable to obtain reliable ABI is due to medial calcification. Bilateral toe [name] [id number] Page 2 of 10 indices were normal with the right being 1.01 and the left being 0.92 and the waveforms were triphasic bilaterally. he did get hold of 30-40 mm compression stockings but is unable to put these on. We will try and get him alternative compression stockings. 09/26/17- he is here in follow up evaluation of a right lower extremity ulcer;he is compliant in wearing compression stocking; ulcer almost epithelialized , anticipate healing next appointment Readmission: 11/17 point upon evaluation patient's wound currently that he is seeing [place] for today is a skin cancerous lesion that was cleared away by his dermatologist on the left medial calf region. He tells me that this is a very similar thing to what he had done previously in fact the last time he saw him in 2018 this was also what was going on at that point. Nonetheless he feels that based on what he seeing currently that this is just having a lot of harder time healing  although it is much closer to the surface than what he is experienced in the past.  He notes that the initial removal was in June 2022 which was this year this is now November and still has not closed. He does have some edema and definitely I think that there is some venous component to his slow healing here. Also think that we can do something better than Vaseline to try to help with getting this to clear up as quickly as possible. He does have a history of atrial fibrillation and is on Eliquis otherwise he really has no major medical problems that would affect wound healing. 09/07/2021 upon evaluation today patient actually appears to be doing significantly better after having wrapped him last week. Overall I think that this is making significant improvements at this time which is great news. I do not see any evidence of infection which is great news as well. No fevers, chills, nausea, vomiting, or diarrhea. 09/14/2021 upon evaluation today patient appears to be doing well with regard to his leg ulcer. He has been tolerating the dressing changes and overall I think that he is making excellent progress. I do not see any signs of active infection at this time. 09/21/2021 upon evaluation today patient actually appears to be making good progress with regard to his wound this is again measuring smaller today no debridement seems to be necessary. We have been using a silver collagen dressing and I think that is doing an awesome job. 09/28/2021 upon evaluation today patient appears to be doing well with regard to his leg currently. I do not see any signs of active infection at this time which is great news. No fevers, chills, nausea, vomiting, or diarrhea. I think this wound is very close to complete resolution. 10/12/2021 upon evaluation today patient actually appears to be doing awesome in regard to his leg ulcer. In fact this appears to be completely healed based on what I am seeing currently. I do not see  any evidence of active infection locally nor systemically at this time which is also great news. No fevers, chills, nausea, vomiting, or diarrhea. Readmission: 12/07/2021 upon evaluation today patient presents for readmission here in the clinic. He was discharged on 10/12/2021 is completely healed. Unfortunately this has reopened at this point and he is having continual issues with new blisters over both lower extremities. This is even worse than what we previously saw. Nonetheless we did actually check his ABIs today and it did reveal that his ABIs were 0.55 on the left and 0.57 on the right. Subsequently this is a definite change from his last arterial study which showed that he did have good blood flow at 1.01 on the right and 0.92 on the left and that was right at the beginning of 2019. Nonetheless based on what we see currently I do think he tolerated the 3 layer compression wrap but I do believe that we probably need to get him tested for his arterial flow in order to see where things stand and if there is something we can do there that would help prevent this from continue to be an ongoing issue. He did not utilize compression socks in the interim from when he was last here till this time. That something is probably going to need lifelong going forward as well. 3/9; patient presents for follow-up. He has no issues or complaints today. He tolerated the compression wrap well. He had ABIs with TBI's done. He denies signs of infection. 12/21/2021 upon evaluation today patient appears to be doing well with regard to the wounds on his legs. Both  are showing signs of significant improvement which is great news although I do believe some sharp debridement would be of benefit here as well. 12/28/2021 upon evaluation today patient appears to be doing well with regard to his wounds. Everything is showing signs of excellent improvement which I am very pleased about. I think that we are headed in the right  direction here. Fortunately there does not appear to be any evidence of infection which is great news there is a little bit of hypergranulation. 01/04/2022 upon evaluation today patient appears to be doing well with regard to his wounds 2 of them are healed 1 is almost so and the other 1 is significantly better. Overall I am extremely pleased with where we stand and I think that he is making excellent progress here. I do not see any evidence of active infection locally nor systemically at this time. 01-16-2022 upon evaluation today patient's wound on the left leg is showing signs of doing quite well. Has not completely cleared at this point but it is much improved. Fortunately I do not see any signs of infection at this time. No fevers, chills, nausea, vomiting, or diarrhea. 01-23-2022 upon evaluation today patient's wound of the left leg actually appears to be pretty much completely healed which is great news. I do not see any signs of active infection locally or systemically which is excellent. With that being said on the right leg what wound is measuring smaller the other 1 is a new wound that just showed up fortunately its not too bad. Has been using Xeroform here and that seems to be doing decently well which is great news. Unfortunately his blood pressure is significantly high we gave him the readings for the past 4-5 visits as well as a recommendation to make an appointment to go discuss this with his primary care provider patient states that he is going to look into doing this. 01-30-2022 upon evaluation today patient appears to be doing well with regard to his left leg everything appears to be healed. On the right leg the more anterior wound is healed the more medial wound that I been concerned about a possible skin cancer unfortunately still does not look great to me. I do believe that we should probably do a biopsy I have talked about it with him a few times I think though it is probably time to  go ahead and do this at this point. 02-09-2022 upon evaluation today patient appears to be doing well with regard to his legs. On the left this appears to be completely healed. On the right he does have 2 areas and be perfectly honest one of them is a skin cancer that he is going to the Mohs surgery clinic for the other seems to be healing nicely. Readmission: 08-02-2022 upon evaluation today patient appears for reevaluation here in our clinic concerning issues that he has been having with wounds over the bilateral lower extremities. I last saw him in May 2023 and at that point we had him completely healed. Unfortunately he is tells me this has broken down to some degree since that point. 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 He tells me this 1 seems to just be getting bigger and bigger not improving. Fortunately he is not having  any significant pain which is good news he does have quite a bit of swelling and he tells me that his fluid pills are not recommended for him to take daily but just in 3-day intervals here and there. 08-09-2022 upon evaluation today patient appears to be doing still somewhat poorly in regard to his legs although in general he does not appear to be feeling as good as he has been. Fortunately there does not appear to be any signs of infection which is good news. With that being said he is having some issues here with having and overall poor feeling in general which again is good I think going to be the biggest complicating factor. He actually seems to be coughing I do not hear any wheezing right now I did listen to his chest he did not have good airflow down low however makes me suspicious for bronchitis or even possibly pneumonia which could be part of what is going on here as  well. Fortunately I do not see any evidence of anything worsening in regard to his legs but I definitely believe that he needs to continue with the compression wraps he took them off yesterday to shower has not had anything on for 24 hours that is why his legs are so swollen today. With regard to his pathology report I did review that it showed some squamous abnormality but no signs of distinct carcinoma. With that being said it was ELIOT, [place] ([id number]) [id number].pdf Page 3 of 10 saying that it could be adjacent to a squamous cell carcinoma nonetheless my suggestion is can be that we have the patient take copy of this report and give it to his Mohs surgeon in order for them to see if there is anything they feel like needs to be done further. With that being said right now I feel like the primary thing is going to be for [place] to try to get his swelling down and keep that down into that hand since he is having so much drainage I believe we can have to bring him in for dressing changes twice a week doing a nurse visit on Mondays. 11/9; since the patient was last here he spent the night in the emergency room he received IV Lasix. Also received antibiotics although he was not discharged on either 1 of these. He also saw his cardiology office who put him on regular Lasix 20 mg [previously on as needed Lasix 20 mg]. Per our intake nurse the swelling in his legs is remarkably better but he still has bilateral lower extremity wounds. He still has wounds on the bilateral lower extremities most problematically on the left lateral calf. He has been using silver alginate under 3 layer compression. 08-23-2022 upon evaluation today patient appears to be doing much better than the last time I saw him 2 weeks ago. At that point I was very concerned about how he was doing he did see Dr. Sherrie Mustache his primary care provider they got him on some blood pressure medication in general his color  and overall appearance looks to be doing much improved compared to the last time I saw him. 09-04-2022 upon evaluation today patient appears to be doing well currently in regard to his wounds. Everything is showing signs of improvement which is great news. Fortunately there does not appear to be any signs of active infection locally or systemically at this time. No fevers, chills, nausea, vomiting, or diarrhea. 09-10-2022 upon evaluation today patient appears to be doing  better in regard to his wounds although the [facility] was extremely stuck to the wound bed. Fortunately there does not appear to be any signs of infection locally or systemically at this time which is great news. No fevers, chills, nausea, vomiting, or diarrhea. 09-17-2022 upon evaluation today patient appears to be doing well currently in regard to his wounds in general. The right leg actually showing signs of excellent improvement and very pleased with where things stand in that regard. Fortunately I do not see any evidence of infection locally or systemically at this time which is great news. No fevers, chills, nausea, vomiting, or diarrhea. 09-24-2022 upon evaluation today patient appears to be doing well currently in regard to his wounds. Things look to be doing quite well. With that being said he did have a result unfortunately on the pathology which showed that he did have a squamous cell carcinoma noted on the biopsy sample I sent last week. He is seeing his dermatologist tomorrow in that regard. With that being said other than that however he seems to really be making some pretty good progress here which is good news. No fevers, chills, nausea, vomiting, or diarrhea. 12/26; the patient has 2 open wounds remaining on the left leg. One is on the left anterior mid tibia and the other is on the right lateral knee just outside of the popliteal fossa. The latter wound apparently has been biopsied showing squamous cell  carcinoma. The patient has been to see dermatology Dr. Evorn Gong who apparently is making him a referral to the [facility] Mohs surgery center. He does not yet have an appointment 10-12-2022 upon evaluation today patient appears to be doing well currently in regard to his wound. He has been tolerating the dressing changes without complication and overall feel like we are headed in the right direction. Fortunately I do not see any signs of infection locally or systemically at this time which is great news. No fevers, chills, nausea, vomiting, or diarrhea. 10-23-2022 upon evaluation today patient appears to be doing well currently in regard to his wound. He has been tolerating the dressing changes without complication. Fortunately there does not appear to be any signs of active infection locally nor systemically which is great news and overall I am extremely pleased with where we stand currently. No fevers, chills, nausea, vomiting, or diarrhea. 10-26-2022 upon evaluation today patient appears to be doing well currently in regard to his wounds. Everything is showing signs of improvement and this is great news. Fortunately I see no evidence of active infection systemically. He does seem to be doing much better in regard to the local infection in regard to his leg. The smell is also greatly improved. Overall I am extremely happy with where we stand today. This is after just a few days with the antibiotic on board. 11-05-2022 upon evaluation today patient appears to be doing well currently in regard to his wounds although the wound where they performed the Mohs surgery does look a little bit hyper granulated I think switching to [facility] may be better for him. He voiced understanding. Fortunately there does not appear to be any evidence of active infection locally nor systemically at this time. 11-12-2022 upon evaluation today patient appears to be doing better in regard to both wounds he has been tolerating  the dressing changes without complication. There is no signs of infection and in general I think you are doing quite well. No fevers, chills, nausea, vomiting, or diarrhea. 11-20-2022 upon  evaluation today patient appears to be doing well currently in regard to his wounds. He has been tolerating the dressing changes without complication. Fortunately there does not appear to be any signs of infection at this time. No fevers, chills, nausea, vomiting, or diarrhea. 11-27-2022 upon evaluation today patient appears to be doing somewhat poorly in regard to his leg in general he has a lot of areas where he looks like he had some spots that popped up. There with regard to new possible blisters. In general I am actually very concerned about the fact that the wrap may be causing some irritation here. I think that we can try to not do the wrap for 1 week, and given the prescription for mupirocin ointment which I will send into the pharmacy for him. 12-04-2022 upon evaluation today patient appears to be doing well currently in regard to his wounds in fact he appears to be pretty much completely healed based on what I am seeing at this point. I do not see any signs of active infection locally nor systemically at this time which is great news. No fevers, chills, nausea, vomiting, or diarrhea. 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 I discussed that with him today he is aware of the situation.-2024 upon evaluation patient presents for readmission here in the clinic actually last saw him December 04, 2022. With that being said previously his dermatologist had asked that if there was something the need to be addressed from a dermatology standpoint and specifically biopsy that we make referral back to them to allow them to do it which I am definitely happy to  do. 01-31-2023 upon evaluation today patient appears to be doing well currently in regard to his wound in fact this looks better he did see Dr. Adolphus Birchwood his dermatologist yesterday and they did perform a biopsy. With that being said he actually looks like he is doing much better Dr. Adolphus Birchwood feels like this may not be a cancerous lesion which will be very good news at the same time I definitely wanted to make sure especially considering his history and the way this looks when we saw him last week. Nonetheless I am extremely pleased with the fact that he is doing so much better at this point. 02-07-2023 upon evaluation today patient appears to be doing well currently in regard to his wound from the standpoint of size it has not gotten any larger. With that being said he does have some issues here still with what appears to be potentially some infection. Wound does not look quite as good as it did last week. We are still waiting on the results for the pathology. I did put a call into dermatology but I have not heard anything back from them as of yet. 02-14-2023 upon evaluation today patient appears to be doing well with regard to his wound infection is definitely under control and looks much better. Fortunately I do not see any signs of systemic infection with anyone locally I think this is improved greatly. 02-19-2023 upon evaluation today patient appears to be doing well currently in regard to his wound which is actually measuring smaller and looking much better. Fortunately I do not see any evidence of active infection locally nor systemically which is great news and overall I am extremely pleased with where we stand today. 02-26-2023  upon evaluation today patient appears to be doing well currently in regard to his wound. He is actually been tolerating the dressing changes without complication. Fortunately there does not appear to be any signs of active infection locally nor systemically which is great news and  overall I am extremely [name], [name] ([id number]) [id number] Page 4 of 10 pleased with where we stand currently. 03-12-2023 upon evaluation today patient's wound actually showed signs of excellent improvement. I am very pleased with where we stand I do believe that we are making good progress here. I do not see any signs of active infection. 03-19-2023 upon evaluation today patient actually appears to be making excellent progress in regard to his leg and getting this closed. In fact is just a very small area that is actually open at this point. I am actually very pleased with what we are seeing today. 03-26-2023 upon evaluation today patient appears to be doing well currently in regard to his leg which is actually showing signs of being completely healed. Fortunately I do not see any signs of active infection locally or systemically which is great news and in general I do believe that we are moving in the right direction here. Electronic Signature(s) Signed: 03/28/2023 9:00:08 AM By: Allen Derry PA-C Entered By: Allen Derry on 03/28/2023 09:00:08 -------------------------------------------------------------------------------- Physical Exam Details Patient Name: Date of Service: [name] 03/26/2023 9:15 A M Medical Record Number: [id number] Patient Account Number: [id number] Date of Birth/Sex: Treating RN: 19-Jul-1946 (77 y.o. [name] Primary Care Provider: [name] Other Clinician: Referring Provider: Treating Provider/Extender: [name] in Treatment: 8 Constitutional Well-nourished and well-hydrated in no acute distress. Respiratory normal breathing without difficulty. Psychiatric this patient is able to make decisions and demonstrates good insight into disease process. Alert and Oriented x 3. pleasant and cooperative. Notes Upon inspection patient's wound bed did require no debridement he appears to be  completely healed and this is great news. The skin in fact is even toughen up and I think at this point he is good to get into his compression socks which she is extremely pleased to see. Electronic Signature(s) Signed: 03/28/2023 9:00:23 AM By: Allen Derry PA-C Entered By: Allen Derry on 03/28/2023 09:00:23 -------------------------------------------------------------------------------- Physician Orders Details Patient Name: Date of Service: [name] 03/26/2023 9:15 A M Medical Record Number: [id number] Patient Account Number: [id number] Date of Birth/Sex: Treating RN: May 21, 1946 (77 y.o. [name] Primary Care Provider: [name] Other Clinician: AVRI, PAOLICELLI ([id number]) [id number] Page 5 of 10 Referring Provider: Treating Provider/Extender: [name] in Treatment: 8 Verbal / Phone Orders: No Diagnosis Coding ICD-10 Coding Code Description [phone] Chronic venous hypertension (idiopathic) with ulcer and inflammation of bilateral lower extremity L97.822 Non-pressure chronic ulcer of other part of left lower leg with fat layer exposed I73.89 Other specified peripheral vascular diseases I48.0 Paroxysmal atrial fibrillation I10 Essential (primary) hypertension Discharge From [facility] Discharge from [facility] Treatment Complete - wear compression socks daily. keep zetuvit on for 3 days Follow-up Appointments Nurse Visit as needed Electronic Signature(s) Signed: 03/28/2023 4:40:27 PM By: [name] MSN RN CNS WTA Signed: 03/28/2023 5:52:14 PM By: Allen Derry PA-C Entered By: [name] on 03/26/2023 09:39:03 -------------------------------------------------------------------------------- Problem List Details Patient Name: Date of Service: [name], [name] 03/26/2023 9:15 A M Medical Record Number: [id number] Patient Account Number: [id number] Date of Birth/Sex: Treating RN: 1946-04-11 (77  y.o. Dwan Bolt,  Chip Boer Primary Care Provider: [name] Other Clinician: Referring Provider: Treating Provider/Extender: [name] in Treatment: 8 Active Problems ICD-10 Encounter Code Description Active Date MDM Diagnosis I87.333 Chronic venous hypertension (idiopathic) with ulcer and inflammation of 01/24/2023 No Yes bilateral lower extremity L97.822 Non-pressure chronic ulcer of other part of left lower leg with fat layer exposed4/18/2024 No Yes I73.89 Other specified peripheral vascular diseases 01/24/2023 No Yes I48.0 Paroxysmal atrial fibrillation 01/24/2023 No Yes I10 Essential (primary) hypertension 01/24/2023 No Yes [name], [name] ([id number]) [id number] Page 6 of 10 Inactive Problems Resolved Problems Electronic Signature(s) Signed: 03/26/2023 9:21:49 AM By: Allen Derry PA-C Entered By: Allen Derry on 03/26/2023 09:21:49 -------------------------------------------------------------------------------- Progress Note Details Patient Name: Date of Service: [name] 03/26/2023 9:15 A M Medical Record Number: [id number] Patient Account Number: [id number] Date of Birth/Sex: Treating RN: May 24, 1946 (77 y.o. 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 the patient has had a vascular workup over 7 years  ago and said everything was normal at that stage. He does not have any chronic problems except for cardiac issues which he sees a cardiologist in [place]. 08/15/2017 -- arterial and venous duplex studies still pending. 08/23/2017 -- venous reflux studies done on 08/13/2017 shows venous incompetence throughout the left lower extremity deep system and focally at the left saphenofemoral junction. 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 No evidence of SVT or DVT in bilateral lower extremities The patient has an appointment at the end of the month to get his arterial duplex study done 09/05/2017 -- the patient was seen at the vein and vascular office yesterday by Bary Castilla. ABI studies were notable for medial calcification and the toe brachial indices were normal and bilateral ankle-brachial) waveforms were normal with triphasic flow. After review of his venous studies he was not a candidate for laser ablation and his lymphedema was to be treated with compression stockings and lymphedema pump pumps 09/12/2017 -- had a low arterial study done at the [place] [facility] -- unable to obtain reliable ABI is due to medial calcification. Bilateral toe indices were normal with the right being 1.01 and the left being 0.92 and the waveforms were triphasic bilaterally. he did get hold of 30-40 mm compression stockings but is unable to put these on. We will try and get him alternative compression stockings. 09/26/17- he is here in follow up evaluation of a right lower extremity ulcer;he is compliant in wearing compression stocking; ulcer almost epithelialized , anticipate healing next appointment Readmission: 11/17 point upon evaluation patient's wound currently that he is seeing [place] for today is a skin cancerous lesion that was cleared away by his dermatologist on the left medial calf region. He tells me that this is a very similar thing to what he had done previously in  fact the last time he saw him in 2018 this was also what was going on at that point. Nonetheless he feels that based on what he seeing currently that this is just having a lot  of harder time healing although it is much closer to the surface than what he is experienced in the past. He notes that the initial removal was in June 2022 which was this year this is now November and still has not closed. He does have some edema and definitely I think that there is some venous component to his slow healing here. Also think that we can do something better than Vaseline to try to help with getting this to clear up as quickly as possible. He does have a history of atrial fibrillation and is on Eliquis otherwise he really has no major medical problems that would affect wound healing. 09/07/2021 upon evaluation today patient actually appears to be doing significantly better after having wrapped him last week. Overall I think that this is making significant improvements at this time which is great news. I do not see any evidence of infection which is great news as well. No fevers, chills, nausea, vomiting, or diarrhea. 09/14/2021 upon evaluation today patient appears to be doing well with regard to his leg ulcer. He has been tolerating the dressing changes and overall I think that he is making excellent progress. I do not see any signs of active infection at this time. 09/21/2021 upon evaluation today patient actually appears to be making good progress with regard to his wound this is again measuring smaller today no debridement seems to be necessary. We have been using a silver collagen dressing and I think that is doing an awesome job. [name], [name] ([id number]) [id number] Page 7 of 10 09/28/2021 upon evaluation today patient appears to be doing well with regard to his leg currently. I do not see any signs of active infection at this time which is great news. No fevers, chills, nausea,  vomiting, or diarrhea. I think this wound is very close to complete resolution. 10/12/2021 upon evaluation today patient actually appears to be doing awesome in regard to his leg ulcer. In fact this appears to be completely healed based on what I am seeing currently. I do not see any evidence of active infection locally nor systemically at this time which is also great news. No fevers, chills, nausea, vomiting, or diarrhea. Readmission: 12/07/2021 upon evaluation today patient presents for readmission here in the clinic. He was discharged on 10/12/2021 is completely healed. Unfortunately this has reopened at this point and he is having continual issues with new blisters over both lower extremities. This is even worse than what we previously saw. Nonetheless we did actually check his ABIs today and it did reveal that his ABIs were 0.55 on the left and 0.57 on the right. Subsequently this is a definite change from his last arterial study which showed that he did have good blood flow at 1.01 on the right and 0.92 on the left and that was right at the beginning of 2019. Nonetheless based on what we see currently I do think he tolerated the 3 layer compression wrap but I do believe that we probably need to get him tested for his arterial flow in order to see where things stand and if there is something we can do there that would help prevent this from continue to be an ongoing issue. He did not utilize compression socks in the interim from when he was last here till this time. That something is probably going to need lifelong going forward as well. 3/9; patient presents for follow-up. He has no issues or complaints today. He tolerated the compression wrap well. He  had ABIs with TBI's done. He denies signs of infection. 12/21/2021 upon evaluation today patient appears to be doing well with regard to the wounds on his legs. Both are showing signs of significant improvement which is great news although I do believe  some sharp debridement would be of benefit here as well. 12/28/2021 upon evaluation today patient appears to be doing well with regard to his wounds. Everything is showing signs of excellent improvement which I am very pleased about. I think that we are headed in the right direction here. Fortunately there does not appear to be any evidence of infection which is great news there is a little bit of hypergranulation. 01/04/2022 upon evaluation today patient appears to be doing well with regard to his wounds 2 of them are healed 1 is almost so and the other 1 is significantly better. Overall I am extremely pleased with where we stand and I think that he is making excellent progress here. I do not see any evidence of active infection locally nor systemically at this time. 01-16-2022 upon evaluation today patient's wound on the left leg is showing signs of doing quite well. Has not completely cleared at this point but it is much improved. Fortunately I do not see any signs of infection at this time. No fevers, chills, nausea, vomiting, or diarrhea. 01-23-2022 upon evaluation today patient's wound of the left leg actually appears to be pretty much completely healed which is great news. I do not see any signs of active infection locally or systemically which is excellent. With that being said on the right leg what wound is measuring smaller the other 1 is a new wound that just showed up fortunately its not too bad. Has been using Xeroform here and that seems to be doing decently well which is great news. Unfortunately his blood pressure is significantly high we gave him the readings for the past 4-5 visits as well as a recommendation to make an appointment to go discuss this with his primary care provider patient states that he is going to look into doing this. 01-30-2022 upon evaluation today patient appears to be doing well with regard to his left leg everything appears to be healed. On the right leg the more  anterior wound is healed the more medial wound that I been concerned about a possible skin cancer unfortunately still does not look great to me. I do believe that we should probably do a biopsy I have talked about it with him a few times I think though it is probably time to go ahead and do this at this point. 02-09-2022 upon evaluation today patient appears to be doing well with regard to his legs. On the left this appears to be completely healed. On the right he does have 2 areas and be perfectly honest one of them is a skin cancer that he is going to the Mohs surgery clinic for the other seems to be healing nicely. Readmission: 08-02-2022 upon evaluation today patient appears for reevaluation here in our clinic concerning issues that he has been having with wounds over the bilateral lower extremities. I last saw him in May 2023 and at that point we had him completely healed. Unfortunately he is tells me this has broken down to some degree since that point. 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 He tells me this 1 seems to just be getting bigger and bigger not improving. Fortunately he is not having any significant pain which is good news he does have quite a bit of swelling and he tells me that his fluid pills are not recommended for him to take daily but just in 3-day intervals here and there. 08-09-2022 upon evaluation today patient appears to be doing still somewhat poorly in regard to his legs although in general he does not appear to be feeling as good as he has been. Fortunately there does not appear to be any signs of infection which is good news. With that being said he is having some issues here with having and overall poor feeling in general which again is good I think going to be the  biggest complicating factor. He actually seems to be coughing I do not hear any wheezing right now I did listen to his chest he did not have good airflow down low however makes me suspicious for bronchitis or even possibly pneumonia which could be part of what is going on here as well. Fortunately I do not see any evidence of anything worsening in regard to his legs but I definitely believe that he needs to continue with the compression wraps he took them off yesterday to shower has not had anything on for 24 hours that is why his legs are so swollen today. With regard to his pathology report I did review that it showed some squamous abnormality but no signs of distinct carcinoma. With that being said it was saying that it could be adjacent to a squamous cell carcinoma nonetheless my suggestion is can be that we have the patient take copy of this report and give it to his Mohs surgeon in order for them to see if there is anything they feel like needs to be done further. With that being said right now I feel like the primary thing is going to be for [place] to try to get his swelling down and keep that down into that hand since he is having so much drainage I believe we can have to bring him in for dressing changes twice a week doing a nurse visit on Mondays. 11/9; since the patient was last here he spent the night in the emergency room he received IV Lasix. Also received antibiotics although he was not discharged on either 1 of these. He also saw his cardiology office who put him on regular Lasix 20 mg [previously on as needed Lasix 20 mg]. Per our intake nurse the swelling in his legs is remarkably better but he still has bilateral lower extremity wounds. He still has wounds on the bilateral lower extremities most problematically on the left lateral calf. He has been using silver alginate under 3 layer compression. 08-23-2022 upon evaluation today patient appears to be doing much better than the last time  I saw him 2 weeks ago. At that point I was very concerned about how he was doing he did see Dr. Sherrie Mustache his primary care provider they got him on some blood pressure medication in general his color and overall appearance looks to be doing much improved compared to the last time I saw him. 09-04-2022 upon evaluation today patient appears to be doing well currently in regard to his wounds. Everything is showing signs of improvement which is great news. Fortunately there does not appear to be any signs of active infection locally or  systemically at this time. No fevers, chills, nausea, vomiting, or diarrhea. 09-10-2022 upon evaluation today patient appears to be doing better in regard to his wounds although the [facility] was extremely stuck to the wound bed. Fortunately there does not appear to be any signs of infection locally or systemically at this time which is great news. No fevers, chills, nausea, vomiting, or diarrhea. 09-17-2022 upon evaluation today patient appears to be doing well currently in regard to his wounds in general. The right leg actually showing signs of excellent improvement and very pleased with where things stand in that regard. Fortunately I do not see any evidence of infection locally or systemically at this time [name], [name] ([id number]) [phone].pdf Page 8 of 10 which is great news. No fevers, chills, nausea, vomiting, or diarrhea. 09-24-2022 upon evaluation today patient appears to be doing well currently in regard to his wounds. Things look to be doing quite well. With that being said he did have a result unfortunately on the pathology which showed that he did have a squamous cell carcinoma noted on the biopsy sample I sent last week. He is seeing his dermatologist tomorrow in that regard. With that being said other than that however he seems to really be making some pretty good progress here which is good news. No fevers, chills, nausea,  vomiting, or diarrhea. 12/26; the patient has 2 open wounds remaining on the left leg. One is on the left anterior mid tibia and the other is on the right lateral knee just outside of the popliteal fossa. 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 The patient has been to see dermatology Dr. Adolphus Birchwood who apparently is making him a referral to the [facility] Mohs surgery center. He does not yet have an appointment 10-12-2022 upon evaluation today patient appears to be doing well currently in regard to his wound. He has been tolerating the dressing changes without complication and overall feel like we are headed in the right direction. Fortunately I do not see any signs of infection locally or systemically at this time which is great news. No fevers, chills, nausea, vomiting, or diarrhea. 10-23-2022 upon evaluation today patient appears to be doing well currently in regard to his wound. He has been tolerating the dressing changes without complication. Fortunately there does not appear to be any signs of active infection locally nor systemically which is great news and overall I am extremely pleased with where we stand currently. No fevers, chills, nausea, vomiting, or diarrhea. 10-26-2022 upon evaluation today patient appears to be doing well currently in regard to his wounds. Everything is showing signs of improvement and this is great news. Fortunately I see no evidence of active infection systemically. He does seem to be doing much better in regard to the local infection in regard to his leg. The smell is also greatly improved. Overall I am extremely happy with where we stand today. This is after just a few days with the antibiotic on board. 11-05-2022 upon evaluation today patient appears to be doing well currently in regard to his wounds although the wound where they performed the Mohs surgery does look a little bit hyper granulated I think switching to [facility]  may be better for him. He voiced understanding. Fortunately there does not appear to be any evidence of active infection locally nor systemically at this time. 11-12-2022 upon evaluation today patient appears to be doing better in regard to both wounds he has been tolerating the  dressing changes without complication. There is no signs of infection and in general I think you are doing quite well. No fevers, chills, nausea, vomiting, or diarrhea. 11-20-2022 upon evaluation today patient appears to be doing well currently in regard to his wounds. He has been tolerating the dressing changes without complication. Fortunately there does not appear to be any signs of infection at this time. No fevers, chills, nausea, vomiting, or diarrhea. 11-27-2022 upon evaluation today patient appears to be doing somewhat poorly in regard to his leg in general he has a lot of areas where he looks like he had some spots that popped up. There with regard to new possible blisters. In general I am actually very concerned about the fact that the wrap may be causing some irritation here. I think that we can try to not do the wrap for 1 week, and given the prescription for mupirocin ointment which I will send into the pharmacy for him. 12-04-2022 upon evaluation today patient appears to be doing well currently in regard to his wounds in fact he appears to be pretty much completely healed based on what I am seeing at this point. I do not see any signs of active infection locally nor systemically at this time which is great news. No fevers, chills, nausea, vomiting, or diarrhea. 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 I discussed that with him today he is aware of the situation.-2024 upon evaluation patient presents for readmission here in the clinic actually last saw him  December 04, 2022. With that being said previously his dermatologist had asked that if there was something the need to be addressed from a dermatology standpoint and specifically biopsy that we make referral back to them to allow them to do it which I am definitely happy to do. 01-31-2023 upon evaluation today patient appears to be doing well currently in regard to his wound in fact this looks better he did see Dr. Adolphus Birchwood his dermatologist yesterday and they did perform a biopsy. With that being said he actually looks like he is doing much better Dr. Adolphus Birchwood feels like this may not be a cancerous lesion which will be very good news at the same time I definitely wanted to make sure especially considering his history and the way this looks when we saw him last week. Nonetheless I am extremely pleased with the fact that he is doing so much better at this point. 02-07-2023 upon evaluation today patient appears to be doing well currently in regard to his wound from the standpoint of size it has not gotten any larger. With that being said he does have some issues here still with what appears to be potentially some infection. Wound does not look quite as good as it did last week. We are still waiting on the results for the pathology. I did put a call into dermatology but I have not heard anything back from them as of yet. 02-14-2023 upon evaluation today patient appears to be doing well with regard to his wound infection is definitely under control and looks much better. Fortunately I do not see any signs of systemic infection with anyone locally I think this is improved greatly. 02-19-2023 upon evaluation today patient appears to be doing well currently in regard to his wound which is actually measuring smaller and looking much better.  Fortunately I do not see any evidence of active infection locally nor systemically which is great news and overall I am extremely pleased with where we stand today. 02-26-2023 upon  evaluation today patient appears to be doing well currently in regard to his wound. He is actually been tolerating the dressing changes without complication. Fortunately there does not appear to be any signs of active infection locally nor systemically which is great news and overall I am extremely pleased with where we stand currently. 03-12-2023 upon evaluation today patient's wound actually showed signs of excellent improvement. I am very pleased with where we stand I do believe that we are making good progress here. I do not see any signs of active infection. 03-19-2023 upon evaluation today patient actually appears to be making excellent progress in regard to his leg and getting this closed. In fact is just a very small area that is actually open at this point. I am actually very pleased with what we are seeing today. 03-26-2023 upon evaluation today patient appears to be doing well currently in regard to his leg which is actually showing signs of being completely healed. Fortunately I do not see any signs of active infection locally or systemically which is great news and in general I do believe that we are moving in the right direction here. Objective Constitutional Well-nourished and well-hydrated in no acute distress. [name], [name] ([id number]) [id number] Page 9 of 10 Vitals Time Taken: 9:15 AM, Height: 75 in, Weight: 220 lbs, BMI: 27.5, Temperature: 98.2 F, Pulse: 56 bpm, Respiratory Rate: 18 breaths/min, Blood Pressure: 150/78 mmHg. Respiratory normal breathing without difficulty. Psychiatric this patient is able to make decisions and demonstrates good insight into disease process. Alert and Oriented x 3. pleasant and cooperative. General Notes: Upon inspection patient's wound bed did require no debridement he appears to be completely healed and this is great news. The skin in fact is even toughen up and I think at this point he is good to get into his  compression socks which she is extremely pleased to see. Integumentary (Hair, Skin) Wound #20 status is Healed - Epithelialized. Original cause of wound was Gradually Appeared. The date acquired was: 12/24/2022. The wound has been in treatment 8 weeks. The wound is located on the Left,Lateral Lower Leg. The wound measures 0cm length x 0cm width x 0cm depth; 0cm^2 area and 0cm^3 volume. There is Fat Layer (Subcutaneous Tissue) exposed. There is a large amount of serous drainage noted. There is medium (34-66%) red, pink granulation within the wound bed. 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 keep zetuvit on for 3 days Follow-up Appointments: Nurse Visit as needed 1. Based on what I am seeing I do believe the patient appears to be completely healed which is great news. 2. I am good recommend that the patient should continue to monitor for any evidence of reopening or worsening if anything changes he should contact the office and let me know. Will see him back for follow-up visit as needed. Electronic Signature(s) Signed: 03/28/2023 9:01:09 AM By: Allen Derry PA-C Entered By: Allen Derry on 03/28/2023 09:01:09 -------------------------------------------------------------------------------- SuperBill Details Patient Name: Date of Service: [name] 03/26/2023 Medical  Record Number: [id number] Patient Account Number: [id number] Date of Birth/Sex: Treating RN: 1945/12/14 (77 y.o. [name] Primary Care Provider: [name] Other Clinician: Referring Provider: Treating  Provider/Extender: [name] in Treatment: [address], [place] [name] ([id number]) [id number] Page 10 of 10 Diagnosis Coding ICD-10 Codes Code Description 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 L97.822 Non-pressure chronic ulcer of other part of left lower leg with fat layer exposed I73.89 Other specified peripheral vascular diseases I48.0 Paroxysmal atrial fibrillation I10 Essential (primary) hypertension Facility Procedures : CPT4 Code: 78295621 Description: 99213 - WOUND CARE VISIT-LEV 3 EST PT Modifier: Quantity: 1 Physician Procedures : CPT4 Code Description Modifier [id number] 99213 - WC PHYS LEVEL 3 - EST PT ICD-10 Diagnosis Description 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 L97.822 Non-pressure chronic ulcer of other  part of left lower leg with fat layer exposed I73.89 Other specified peripheral vascular diseases I48.0 Paroxysmal atrial fibrillation Quantity: 1 Electronic Signature(s) Signed: 03/28/2023 9:01:41 AM By: Allen Derry PA-C Entered By: Allen Derry on 03/28/2023 09:01:41

## 2023-03-27 DIAGNOSIS — M79641 Pain in right hand: Secondary | ICD-10-CM | POA: Diagnosis not present

## 2023-03-28 NOTE — Progress Notes (Signed)
AUGUS, FEINSTEIN R ([id number]) [id number] Page 1 of 8 Visit Report for 03/26/2023 Arrival Information Details Patient Name: Date of Service: [name] 03/26/2023 9:15 [name] M Medical Record Number: [id number] Patient Account Number: [id number] Date of Birth/Sex: Treating RN: 10-20-1945 (77 y.o. [name] Primary Care Sharel Behne: [name] Other Clinician: Referring Laquitta Dominski: Treating Mariano Doshi/Extender: [name] in Treatment: 8 Visit Information History Since Last Visit Added or deleted any medications: No Patient Arrived: Ambulatory Has Dressing in Place as Prescribed: Yes Arrival Time: 09:14 Has Compression in Place as Prescribed: Yes Accompanied By: self Pain Present Now: No Transfer Assistance: None Patient Identification Verified: Yes Secondary Verification Process Completed: Yes Patient Requires Transmission-Based Precautions: No Patient Has Alerts: Yes Patient Alerts: 12/2021 TBI R)0.75 L)0.72 Electronic Signature(s) Signed: 03/28/2023 4:40:27 PM By: [name] MSN RN CNS WTA Entered By: [name] on 03/26/2023 09:15:14 -------------------------------------------------------------------------------- Clinic Level of Care Assessment Details Patient Name: Date of Service: [name] [name] Corner R. 03/26/2023 9:15 [name] M Medical Record Number: [id number] Patient Account Number: [id number] Date of Birth/Sex: Treating RN: 24-Oct-1945 (77 y.o. [name] Primary Care Sandeep Delagarza: [name] Other Clinician: Referring Latron Ribas: Treating Jahniah Pallas/Extender: [name] in Treatment: 8 Clinic Level of Care Assessment Items TOOL 4 Quantity Score X- 1 0 Use when only an EandM is performed on FOLLOW-UP visit ASSESSMENTS - Nursing Assessment / Reassessment X- 1 10 Reassessment of Co-morbidities (includes updates in patient status) X- 1 5 Reassessment of Adherence to Treatment Plan ASSESSMENTS -  Wound and Skin [name] ssessment / Reassessment X - Simple Wound Assessment / Reassessment - one wound 1 5 MARQUEL, DENT R ([id number]) [id number].pdf Page 2 of 8 []  - 0 Complex Wound Assessment / Reassessment - multiple wounds []  - 0 Dermatologic / Skin Assessment (not related to wound area) ASSESSMENTS - Focused Assessment []  - 0 Circumferential Edema Measurements - multi extremities []  - 0 Nutritional Assessment / Counseling / Intervention []  - 0 Lower Extremity Assessment (monofilament, tuning fork, pulses) []  - 0 Peripheral Arterial Disease Assessment (using hand held doppler) ASSESSMENTS - Ostomy and/or Continence Assessment and Care []  - 0 Incontinence Assessment and Management []  - 0 Ostomy Care Assessment and Management (repouching, etc.) PROCESS - Coordination of Care X - Simple Patient / Family Education for ongoing care 1 15 []  - 0 Complex (extensive) Patient / Family Education for ongoing care X- 1 10 Staff obtains Chiropractor, Records, T Results / Process Orders est []  - 0 Staff telephones HHA, Nursing Homes / Clarify orders / etc []  - 0 Routine Transfer to another Facility (non-emergent condition) []  - 0 Routine Hospital Admission (non-emergent condition) []  - 0 New Admissions / Manufacturing engineer / Ordering NPWT Apligraf, etc. , []  - 0 Emergency Hospital Admission (emergent condition) X- 1 10 Simple Discharge Coordination []  - 0 Complex (extensive) Discharge Coordination PROCESS - Special Needs []  - 0 Pediatric / Minor Patient Management []  - 0 Isolation Patient Management []  - 0 Hearing / Language / Visual special needs []  - 0 Assessment of Community assistance (transportation, [name], etc.) []  - 0 Additional assistance / Altered mentation []  - 0 Support Surface(s) Assessment (bed, cushion, seat, etc.) INTERVENTIONS - Wound Cleansing / Measurement X - Simple Wound Cleansing - one wound 1 5 []  - 0 Complex Wound  Cleansing - multiple wounds X- 1 5 Wound Imaging (photographs - any number of wounds) []  - 0 Wound Tracing (instead of photographs) []  - 0 Simple  Wound Measurement - one wound []  - 0 Complex Wound Measurement - multiple wounds INTERVENTIONS - Wound Dressings X - Small Wound Dressing one or multiple wounds 1 10 []  - 0 Medium Wound Dressing one or multiple wounds []  - 0 Large Wound Dressing one or multiple wounds []  - 0 Application of Medications - topical []  - 0 Application of Medications - injection INTERVENTIONS - Miscellaneous []  - 0 External ear exam []  - 0 Specimen Collection (cultures, biopsies, blood, body fluids, etc.) []  - 0 Specimen(s) / Culture(s) sent or taken to Lab for analysis STERLING, KEARNEY ([id number]) [id number] Page 3 of 8 []  - 0 Patient Transfer (multiple staff / Nurse, adult / Similar devices) []  - 0 Simple Staple / Suture removal (25 or less) []  - 0 Complex Staple / Suture removal (26 or more) []  - 0 Hypo / Hyperglycemic Management (close monitor of Blood Glucose) []  - 0 Ankle / Brachial Index (ABI) - do not check if billed separately X- 1 5 Vital Signs Has the patient been seen at the hospital within the last three years: Yes Total Score: 80 Level Of Care: New/Established - Level 3 Electronic Signature(s) Signed: 03/28/2023 4:40:27 PM By: [name] MSN RN CNS WTA Entered By: [name] on 03/26/2023 09:39:50 -------------------------------------------------------------------------------- Encounter Discharge Information Details Patient Name: Date of Service: [name] [name], [name] LBERT R. 03/26/2023 9:15 [name] M Medical Record Number: [id number] Patient Account Number: [id number] Date of Birth/Sex: Treating RN: 1946/09/02 (77 y.o. [name] Primary Care Chirag Krueger: [name] Other Clinician: Referring Yazmyne Sara: Treating Louis Gaw/Extender: [name] in Treatment: 8 Encounter Discharge Information  Items Discharge Condition: Stable Ambulatory Status: Ambulatory Discharge Destination: Home Transportation: Private Auto Accompanied By: self Schedule Follow-up Appointment: No Clinical Summary of Care: Electronic Signature(s) Signed: 03/28/2023 4:40:27 PM By: [name] MSN RN CNS WTA Entered By: [name] on 03/26/2023 09:41:32 -------------------------------------------------------------------------------- Lower Extremity Assessment Details Patient Name: Date of Service: [name] [name], [name] LBERT R. 03/26/2023 9:15 [name] M Medical Record Number: [id number] Patient Account Number: [id number] Date of Birth/Sex: Treating RN: 23-Jul-1946 (77 y.o. [name] Primary Care Estuardo Frisbee: [name] Other Clinician: DUARD, GREENIA R ([id number]) [id number] Page 4 of 8 Referring Egan Berkheimer: Treating Keosha Rossa/Extender: [name] in Treatment: 8 Edema Assessment Assessed: [Left: Yes] [Right: No] Edema: [Left: [name]] [Right: o] Calf Left: Right: Point of Measurement: 33 cm From Medial Instep 32 cm Ankle Left: Right: Point of Measurement: 12 cm From Medial Instep 22 cm Vascular Assessment Pulses: Dorsalis Pedis Palpable: [Left:Yes] Electronic Signature(s) Signed: 03/28/2023 4:40:27 PM By: [name] MSN RN CNS WTA Entered By: [name] on 03/26/2023 09:38:16 -------------------------------------------------------------------------------- Multi Wound Chart Details Patient Name: Date of Service: [name] [name], [name] LBERT R. 03/26/2023 9:15 [name] M Medical Record Number: [id number] Patient Account Number: [id number] Date of Birth/Sex: Treating RN: [birth year] (77 y.o. [name] Primary Care Khalfani Weideman: [name] Other Clinician: Referring Illias Pantano: Treating Alliana Mcauliff/Extender: [name] in Treatment: 8 Vital Signs Height(in): 75 Pulse(bpm): 56 Weight(lbs): 220 Blood Pressure(mmHg): 150/78 Body Mass Index(BMI):  27.5 Temperature(F): 98.2 Respiratory Rate(breaths/min): 18 [20:Photos:] [[name]:[name]] Left, Lateral Lower Leg [name] Wound Location: Gradually Appeared [name] Wounding Event: Atypical [name] Primary Etiology: Arrhythmia, Hypertension, Gout [name] Comorbid History: 12/24/2022 [name] Date Acquired: 8 [name] Tania Ade of TreatmentCLAYDEN, LAFLASH ([id number]) [phone].pdf Page 5 of 8 Healed - Epithelialized [name] Wound Status: No [name] Wound Recurrence: 0x0x0 [name] Measurements L  x W x D (cm) 0 [name] [name] (cm) : rea 0 [name] Volume (cm) : 100.00% [name] % Reduction in Area: 100.00% [name] % Reduction in Volume: Full Thickness Without Exposed [name] Classification: Support Structures Large [name] Exudate Amount: Serous [name] Exudate Type: amber [name] Exudate Color: Medium (34-66%) [name] Granulation Amount: Red, Pink [name] Granulation Quality: Fat Layer (Subcutaneous Tissue): Yes [name] Exposed Structures: Fascia: No Tendon: No Muscle: No Joint: No Bone: No Medium (34-66%) [name] Epithelialization: Treatment Notes Electronic Signature(s) Signed: 03/28/2023 4:40:27 PM By: [name] MSN RN CNS WTA Entered By: [name] on 03/26/2023 09:38:21 -------------------------------------------------------------------------------- Multi-Disciplinary Care Plan Details Patient Name: Date of Service: [name] [name], [name] LBERT R. 03/26/2023 9:15 [name] M Medical Record Number: [id number] Patient Account Number: [id number] Date of Birth/Sex: Treating RN: [birth year] (77 y.o. [name] Primary Care Makayela Secrest: [name] Other Clinician: Referring Quintavious Rinck: Treating Lashante Fryberger/Extender: [name] in Treatment: 8 Active Inactive Electronic Signature(s) Signed: 03/28/2023 4:40:27 PM By: [name] MSN RN CNS WTA Entered By: [name] on 03/26/2023  09:40:07 -------------------------------------------------------------------------------- Pain Assessment Details Patient Name: Date of Service: [name] [name], [name] LBERT R. 03/26/2023 9:15 [name] SANTE, NAGLE R ([id number]) [id number] Page 6 of 8 Medical Record Number: [id number] Patient Account Number: [id number] Date of Birth/Sex: Treating RN: [birth year] (77 y.o. [name] Primary Care Rmani Kapusta: [name] Other Clinician: Referring Jaysiah Marchetta: Treating Efren Kross/Extender: [name] in Treatment: 8 Active Problems Location of Pain Severity and Description of Pain Patient Has Paino No Site Locations Pain Management and Medication Current Pain Management: Electronic Signature(s) Signed: 03/28/2023 4:40:27 PM By: [name] MSN RN CNS WTA Entered By: [name] on 03/26/2023 09:17:33 -------------------------------------------------------------------------------- Patient/Caregiver Education Details Patient Name: Date of Service: [name] 6/18/2024andnbsp9:15 [name] M Medical Record Number: [id number] Patient Account Number: [id number] Date of Birth/Gender: Treating RN: 03/18/1946 (77 y.o. [name] Primary Care Physician: [name] Other Clinician: Referring Physician: Treating Physician/Extender: [name] in Treatment: 8 Education Assessment Education Provided To: Patient Education Topics Provided Discharge Packet: Handouts: Controlling Swelling with Compression Stockings Methods: Explain/Verbal Responses: State content correctly ASIA, CONNLEY R ([id number]) [id number] Page 7 of 8 Electronic Signature(s) Signed: 03/28/2023 4:40:27 PM By: [name] MSN RN CNS WTA Entered By: [name] on 03/26/2023 09:40:48 -------------------------------------------------------------------------------- Wound Assessment Details Patient Name: Date of Service: [name] [name]  [name], [name] LBERT R. 03/26/2023 9:15 [name] M Medical Record Number: [id number] Patient Account Number: [id number] Date of Birth/Sex: Treating RN: 03/04/1946 (77 y.o. [name] Primary Care Eudell Mcphee: [name] Other Clinician: Referring Tiphani Mells: Treating Carin Shipp/Extender: [name] in Treatment: 8 Wound Status Wound Number: 20 Primary Etiology: Atypical Wound Location: Left, Lateral Lower Leg Wound Status: Healed - Epithelialized Wounding Event: Gradually Appeared Comorbid History: Arrhythmia, Hypertension, Gout Date Acquired: 12/24/2022 Weeks Of Treatment: 8 Clustered Wound: No Photos Wound Measurements Length: (cm) Width: (cm) Depth: (cm) Area: (cm) Volume: (cm) 0 % Reduction in Area: 100% 0 % Reduction in Volume: 100% 0 Epithelialization: Medium (34-66%) 0 0 Wound Description Classification: Full Thickness Without Exposed Suppor Exudate Amount: Large Exudate Type: Serous Exudate Color: amber t Structures Wound Bed Granulation Amount: Medium (34-66%) Exposed Structure Granulation Quality: Red, Pink Fascia Exposed: No Fat Layer (Subcutaneous Tissue) Exposed: Yes Tendon Exposed: No Muscle Exposed: No Joint Exposed: No Bone Exposed: No Treatment Notes JAIVEER, TRABERT ([id number]) [id number] Page 8  of 8 Wound #20 (Lower Leg) Wound Laterality: Left, Lateral Cleanser Peri-Wound Care Topical Primary Dressing Secondary Dressing Secured With Compression Wrap Compression Stockings Add-Ons Electronic Signature(s) Signed: 03/28/2023 4:40:27 PM By: [name] MSN RN CNS WTA Entered By: [name] on 03/26/2023 09:38:10 -------------------------------------------------------------------------------- Vitals Details Patient Name: Date of Service: [name] [name], [name] LBERT R. 03/26/2023 9:15 [name] M Medical Record Number: [id number] Patient Account Number: [id number] Date of Birth/Sex: Treating RN: 1946/02/15 (77 y.o. [name] Primary Care Jaquasia Doscher: [name] Other Clinician: Referring Jacqualin Shirkey: Treating Tenna Lacko/Extender: [name] in Treatment: 8 Vital Signs Time Taken: 09:15 Temperature (F): 98.2 Height (in): 75 Pulse (bpm): 56 Weight (lbs): 220 Respiratory Rate (breaths/min): 18 Body Mass Index (BMI): 27.5 Blood Pressure (mmHg): 150/78 Reference Range: 80 - 120 mg / dl Electronic Signature(s) Signed: 03/28/2023 4:40:27 PM By: [name] MSN RN CNS WTA Entered By: [name] on 03/26/2023 09:17:26

## 2023-04-03 DIAGNOSIS — H2513 Age-related nuclear cataract, bilateral: Secondary | ICD-10-CM | POA: Diagnosis not present

## 2023-04-03 DIAGNOSIS — H3530 Unspecified macular degeneration: Secondary | ICD-10-CM | POA: Diagnosis not present

## 2023-04-03 DIAGNOSIS — H5203 Hypermetropia, bilateral: Secondary | ICD-10-CM | POA: Diagnosis not present

## 2023-05-16 ENCOUNTER — Encounter: Payer: Medicare PPO | Attending: Physician Assistant | Admitting: Physician Assistant

## 2023-05-16 DIAGNOSIS — I428 Other cardiomyopathies: Secondary | ICD-10-CM | POA: Diagnosis not present

## 2023-05-16 DIAGNOSIS — L97822 Non-pressure chronic ulcer of other part of left lower leg with fat layer exposed: Secondary | ICD-10-CM | POA: Insufficient documentation

## 2023-05-16 DIAGNOSIS — I87333 Chronic venous hypertension (idiopathic) with ulcer and inflammation of bilateral lower extremity: Secondary | ICD-10-CM | POA: Diagnosis not present

## 2023-05-16 DIAGNOSIS — I10 Essential (primary) hypertension: Secondary | ICD-10-CM | POA: Insufficient documentation

## 2023-05-16 DIAGNOSIS — I480 Paroxysmal atrial fibrillation: Secondary | ICD-10-CM | POA: Insufficient documentation

## 2023-05-16 DIAGNOSIS — I739 Peripheral vascular disease, unspecified: Secondary | ICD-10-CM | POA: Diagnosis not present

## 2023-05-16 DIAGNOSIS — I872 Venous insufficiency (chronic) (peripheral): Secondary | ICD-10-CM | POA: Diagnosis not present

## 2023-05-16 DIAGNOSIS — Z85828 Personal history of other malignant neoplasm of skin: Secondary | ICD-10-CM | POA: Diagnosis not present

## 2023-05-16 DIAGNOSIS — Z7901 Long term (current) use of anticoagulants: Secondary | ICD-10-CM | POA: Insufficient documentation

## 2023-05-16 DIAGNOSIS — L958 Other vasculitis limited to the skin: Secondary | ICD-10-CM | POA: Diagnosis not present

## 2023-05-20 DIAGNOSIS — I739 Peripheral vascular disease, unspecified: Secondary | ICD-10-CM | POA: Diagnosis not present

## 2023-05-20 DIAGNOSIS — I480 Paroxysmal atrial fibrillation: Secondary | ICD-10-CM | POA: Diagnosis not present

## 2023-05-20 DIAGNOSIS — Z85828 Personal history of other malignant neoplasm of skin: Secondary | ICD-10-CM | POA: Diagnosis not present

## 2023-05-20 DIAGNOSIS — Z7901 Long term (current) use of anticoagulants: Secondary | ICD-10-CM | POA: Diagnosis not present

## 2023-05-20 DIAGNOSIS — L97822 Non-pressure chronic ulcer of other part of left lower leg with fat layer exposed: Secondary | ICD-10-CM | POA: Diagnosis not present

## 2023-05-20 DIAGNOSIS — I428 Other cardiomyopathies: Secondary | ICD-10-CM | POA: Diagnosis not present

## 2023-05-20 DIAGNOSIS — I10 Essential (primary) hypertension: Secondary | ICD-10-CM | POA: Diagnosis not present

## 2023-05-20 DIAGNOSIS — I87333 Chronic venous hypertension (idiopathic) with ulcer and inflammation of bilateral lower extremity: Secondary | ICD-10-CM | POA: Diagnosis not present

## 2023-05-20 DIAGNOSIS — I872 Venous insufficiency (chronic) (peripheral): Secondary | ICD-10-CM | POA: Diagnosis not present

## 2023-05-20 NOTE — Progress Notes (Signed)
DAMEK, BRAZILE R ([id number]) [id number] Page 1 of 4 Visit Report for 05/20/2023 Arrival Information Details Patient Name: Date of Service: [name] [name] 05/20/2023 12:30 PM Medical Record Number: [id number] Patient Account Number: [id number] Date of Birth/Sex: Treating RN: 1946/02/11 (76 y.o. [name]) Yevonne Pax Primary Care : [name] Other Clinician: Referring : Treating /Extender: [name] in Treatment: 0 Visit Information History Since Last Visit Added or deleted any medications: No Patient Arrived: Ambulatory Any new allergies or adverse reactions: No Arrival Time: 12:35 Had a fall or experienced change in No Accompanied By: self activities of daily living that may affect Transfer Assistance: None risk of falls: Patient Identification Verified: Yes Signs or symptoms of abuse/neglect since last visito No Secondary Verification Process Completed: Yes Hospitalized since last visit: No Patient Requires Transmission-Based Precautions: No Implantable device outside of the clinic excluding No Patient Has Alerts: No cellular tissue based products placed in the center since last visit: Has Dressing in Place as Prescribed: Yes Has Compression in Place as Prescribed: Yes Pain Present Now: No Electronic Signature(s) Signed: 05/20/2023 12:56:36 PM By: Yevonne Pax RN Entered By: Yevonne Pax on 05/20/2023 12:56:36 -------------------------------------------------------------------------------- Clinic Level of Care Assessment Details Patient Name: Date of Service: [name] [name] 05/20/2023 12:30 PM Medical Record Number: [id number] Patient Account Number: [id number] Date of Birth/Sex: Treating RN: 1945-10-23 (76 y.o. [name]) Yevonne Pax Primary Care : [name] Other Clinician: Referring : Treating /Extender: [name] in Treatment: 0 Clinic Level  of Care Assessment Items TOOL 1 Quantity Score []  - 0 Use when EandM and Procedure is performed on INITIAL visit ASSESSMENTS - Nursing Assessment / Reassessment []  - 0 General Physical Exam (combine w/ comprehensive assessment (listed just below) when performed on new pt. evals) []  - 0 Comprehensive Assessment (HX, ROS, Risk Assessments, Wounds Hx, etc.) HUD, GIUSTI R ([id number]) [id number] Page 2 of 4 ASSESSMENTS - Wound and Skin Assessment / Reassessment []  - 0 Dermatologic / Skin Assessment (not related to wound area) ASSESSMENTS - Ostomy and/or Continence Assessment and Care []  - 0 Incontinence Assessment and Management []  - 0 Ostomy Care Assessment and Management (repouching, etc.) PROCESS - Coordination of Care []  - 0 Simple Patient / Family Education for ongoing care []  - 0 Complex (extensive) Patient / Family Education for ongoing care []  - 0 Staff obtains Chiropractor, Records, T Results / Process Orders est []  - 0 Staff telephones HHA, Nursing Homes / Clarify orders / etc []  - 0 Routine Transfer to another Facility (non-emergent condition) []  - 0 Routine Hospital Admission (non-emergent condition) []  - 0 New Admissions / Manufacturing engineer / Ordering NPWT Apligraf, etc. , []  - 0 Emergency Hospital Admission (emergent condition) PROCESS - Special Needs []  - 0 Pediatric / Minor Patient Management []  - 0 Isolation Patient Management []  - 0 Hearing / Language / Visual special needs []  - 0 Assessment of Community assistance (transportation, [name], etc.) []  - 0 Additional assistance / Altered mentation []  - 0 Support Surface(s) Assessment (bed, cushion, seat, etc.) INTERVENTIONS - Miscellaneous []  - 0 External ear exam []  - 0 Patient Transfer (multiple staff / Nurse, adult / Similar devices) []  - 0 Simple Staple / Suture removal (25 or less) []  - 0 Complex Staple / Suture removal (26 or more) []  -  0 Hypo/Hyperglycemic Management (do not check if billed separately) []  - 0 Ankle / Brachial Index (ABI) - do not check if billed separately Has  the patient been seen at the hospital within the last three years: Yes Total Score: 0 Level Of Care: ____ Electronic Signature(s) Unsigned Entered By: Yevonne Pax on 05/20/2023 12:59:54 -------------------------------------------------------------------------------- Compression Therapy Details Patient Name: Date of Service: [name] [name] 05/20/2023 12:30 PM Medical Record Number: [id number] Patient Account Number: [id number] Date of Birth/Sex: Treating RN: [birth year] (76 y.o. [name] Primary Care : [name] Other Clinician: [name], MCCARLEY R ([id number]) [id number] Page 3 of 4 Referring : Treating /Extender: [name] in Treatment: 0 Compression Therapy Performed for Wound Assessment: Wound #21 Left,Lateral Lower Leg Performed By: Clinician Yevonne Pax, RN Compression Type: Double Layer Electronic Signature(s) Signed: 05/20/2023 12:57:19 PM By: Yevonne Pax RN Entered By: Yevonne Pax on 05/20/2023 12:57:18 -------------------------------------------------------------------------------- Encounter Discharge Information Details Patient Name: Date of Service: [name] [name] 05/20/2023 12:30 PM Medical Record Number: [id number] Patient Account Number: [id number] Date of Birth/Sex: Treating RN: [birth year] (76 y.o. [name]) Yevonne Pax Primary Care : [name] Other Clinician: Referring : Treating /Extender: [name] in Treatment: 0 Encounter Discharge Information Items Discharge Condition: Stable Ambulatory Status: Ambulatory Discharge Destination: Home Transportation: Private Auto Accompanied By: self Schedule Follow-up Appointment: Yes Clinical Summary of Care: Electronic  Signature(s) Signed: 05/20/2023 12:59:46 PM By: Yevonne Pax RN Entered By: Yevonne Pax on 05/20/2023 12:59:46 -------------------------------------------------------------------------------- Wound Assessment Details Patient Name: Date of Service: [name] [name] Beam LBERT R. 05/20/2023 12:30 PM Medical Record Number: [id number] Patient Account Number: [id number] Date of Birth/Sex: Treating RN: 1946-04-01 (76 y.o. [name] Primary Care : [name] Other Clinician: Referring : Treating /Extender: [name] in Treatment: 0 Wound Status Wound Number: 21 Primary Etiology: Vasculitis Wound Location: Left, Lateral Lower Leg Wound Status: Open Wounding Event: Gradually Appeared Comorbid History: Arrhythmia, Hypertension, Gout Date Acquired: 04/08/2023 GAUTHAM, ROOSEVELT ([id number]) [id number] Page 4 of 4 Weeks Of Treatment: 0 Clustered Wound: No Wound Measurements Length: (cm) 5 Width: (cm) 3.5 Depth: (cm) 0.1 Area: (cm) 13.744 Volume: (cm) 1.374 % Reduction in Area: 0% % Reduction in Volume: 0% Epithelialization: None Tunneling: No Undermining: No Wound Description Classification: Full Thickness Without Exposed Suppor Exudate Amount: Medium Exudate Type: Serosanguineous Exudate Color: red, brown t Structures Foul Odor After Cleansing: No Slough/Fibrino Yes Wound Bed Granulation Amount: Small (1-33%) Exposed Structure Granulation Quality: Red Fascia Exposed: No Necrotic Amount: Large (67-100%) Fat Layer (Subcutaneous Tissue) Exposed: Yes Necrotic Quality: Adherent Slough Tendon Exposed: No Muscle Exposed: No Joint Exposed: No Bone Exposed: No Treatment Notes Wound #21 (Lower Leg) Wound Laterality: Left, Lateral Cleanser Soap and Water Discharge Instruction: Gently cleanse wound with antibacterial soap, rinse and pat dry prior to dressing wounds Wound Cleanser Discharge Instruction: Wash your  hands with soap and water. Remove old dressing, discard into plastic bag and place into trash. Cleanse the wound with Wound Cleanser prior to applying a clean dressing using gauze sponges, not tissues or cotton balls. Do not scrub or use excessive force. Pat dry using gauze sponges, not tissue or cotton balls. Peri-Wound Care AandD Ointment Discharge Instruction: Apply AandD Ointment as directed Topical Primary Dressing Hydrofera Blue Ready Transfer Foam, 2.5x2.5 (in/in) Discharge Instruction: Apply Hydrofera Blue Ready to wound bed as directed Secondary Dressing Zetuvit Plus 4x8 (in/in) Secured With Compression Wrap Urgo K2, two layer compression system, regular Compression Stockings Add-Ons Electronic Signature(s) Signed: 05/20/2023 12:56:58 PM By: Yevonne Pax RN Entered By: Yevonne Pax on 05/20/2023 12:56:57

## 2023-05-21 ENCOUNTER — Ambulatory Visit: Payer: Medicare PPO | Admitting: Physician Assistant

## 2023-05-21 NOTE — Progress Notes (Signed)
EIRIK, TINKER R ([id number]) [id number] Page 1 of 2 Visit Report for 05/20/2023 Physician Orders Details Patient Name: Date of Service: [name] 05/20/2023 12:30 PM Medical Record Number: [id number] Patient Account Number: [id number] Date of Birth/Sex: Treating RN: 05/07/1946 (76 y.o. [name]) Yevonne Pax Primary Care Provider: [name] Other Clinician: Referring Provider: Treating Provider/Extender: [name] in Treatment: 0 Verbal / Phone Orders: No Diagnosis Coding Follow-up Appointments Return Appointment in 1 week. Bathing/ Shower/ Hygiene May shower; gently cleanse wound with antibacterial soap, rinse and pat dry prior to dressing wounds Edema Control - Lymphedema / Segmental Compressive Device / Other Patient to wear own compression stockings. Remove compression stockings every night before going to bed and put on every morning when getting up. - right Elevate, Exercise Daily and A void Standing for Long Periods of Time. Elevate legs to the level of the heart and pump ankles as often as possible Elevate leg(s) parallel to the floor when sitting. Wound Treatment Wound #21 - Lower Leg Wound Laterality: Left, Lateral Cleanser: Soap and Water 2 x Per Week/30 Days Discharge Instructions: Gently cleanse wound with antibacterial soap, rinse and pat dry prior to dressing wounds Cleanser: Wound Cleanser 2 x Per Week/30 Days Discharge Instructions: Wash your hands with soap and water. Remove old dressing, discard into plastic bag and place into trash. Cleanse the wound with Wound Cleanser prior to applying a clean dressing using gauze sponges, not tissues or cotton balls. Do not scrub or use excessive force. Pat dry using gauze sponges, not tissue or cotton balls. Peri-Wound Care: AandD Ointment 2 x Per Week/30 Days Discharge Instructions: Apply AandD Ointment as directed Prim Dressing: Hydrofera Blue Ready Transfer Foam,  2.5x2.5 (in/in) 2 x Per Week/30 Days ary Discharge Instructions: Apply Hydrofera Blue Ready to wound bed as directed Secondary Dressing: Zetuvit Plus 4x8 (in/in) 2 x Per Week/30 Days Compression Wrap: Urgo K2, two layer compression system, regular 2 x Per Week/30 Days Electronic Signature(s) Signed: 05/20/2023 12:59:21 PM By: Yevonne Pax RN Signed: 05/21/2023 4:30:30 PM By: Allen Derry PA-C Entered By: Yevonne Pax on 05/20/2023 12:59:20 Shawnee Knapp R ([id number]) [id number] Page 2 of 2 -------------------------------------------------------------------------------- SuperBill Details Patient Name: Date of Service: [name] 05/20/2023 Medical Record Number: [id number] Patient Account Number: [id number] Date of Birth/Sex: Treating RN: 04-05-1946 (76 y.o. [name]) Yevonne Pax Primary Care Provider: [name] Other Clinician: Referring Provider: Treating Provider/Extender: [name] in Treatment: 0 Diagnosis Coding ICD-10 Codes Code Description [phone] Chronic venous hypertension (idiopathic) with ulcer and inflammation of bilateral lower extremity L97.822 Non-pressure chronic ulcer of other part of left lower leg with fat layer exposed I73.89 Other specified peripheral vascular diseases I48.0 Paroxysmal atrial fibrillation I10 Essential (primary) hypertension Facility Procedures : CPT4 Code: [id number] Description: (Facility Use Only) [phone] - APPLY MULTLAY COMPRS LWR LT LEG Modifier: Quantity: 1 Electronic Signature(s) Signed: 05/20/2023 1:00:06 PM By: Yevonne Pax RN Signed: 05/21/2023 4:30:30 PM By: Allen Derry PA-C Entered By: Yevonne Pax on 05/20/2023 13:00:06

## 2023-05-23 NOTE — Progress Notes (Signed)
[place], WOLAVER [name] ([id number]) [id number] Page 1 of 9 Visit Report for 05/16/2023 Allergy List Details Patient Name: Date of Service: [name] 05/16/2023 9:30 [name] M Medical Record Number: [id number] Patient Account Number: [id number] Date of Birth/Sex: Treating RN: [birth year] (76 y.o. [name]) Yevonne Pax Primary Care : [name] Other Clinician: Referring : Treating /Extender: [name] in Treatment: 0 Allergies Active Allergies No Known Allergies Allergy Notes Electronic Signature(s) Signed: 05/23/2023 8:04:04 AM By: Yevonne Pax RN Entered By: Yevonne Pax on 05/16/2023 09:41:10 -------------------------------------------------------------------------------- Arrival Information Details Patient Name: Date of Service: [name], [name] 05/16/2023 9:30 [name] M Medical Record Number: [id number] Patient Account Number: [id number] Date of Birth/Sex: Treating RN: [birth year] (76 y.o. [name]) Yevonne Pax Primary Care : [name] Other Clinician: Referring : Treating /Extender: [name] in Treatment: 0 Visit Information Patient Arrived: Ambulatory Arrival Time: 09:39 Accompanied By: self Transfer Assistance: None Patient Identification Verified: Yes Secondary Verification Process Completed: Yes Patient Requires Transmission-Based Precautions: No Patient Has Alerts: No JRUE, ORMES ([id number]) Electronic Signature(s) Signed: 05/23/2023 8:04:04 AM By: Yevonne Pax RN Entered By: Jettie Pagan, History Since Last Visit Added or deleted any medications: No Any new allergies or adverse reactions: No Had [name] fall or experienced change in activities of daily living that may affect risk of falls: No Signs or symptoms of abuse/neglect since last visito No Hospitalized since last visit: No Implantable device outside of the clinic excluding cellular tissue based products  placed in the center since last visit: No Has Dressing in Place as Prescribed: Yes Pain Present Now: No [phone].pdf Page 2 of 9 Carrie on 05/16/2023 09:40:19 -------------------------------------------------------------------------------- Clinic Level of Care Assessment Details Patient Name: Date of Service: [name] 05/16/2023 9:30 [name] M Medical Record Number: [id number] Patient Account Number: [id number] Date of Birth/Sex: Treating RN: 1946/02/26 (76 y.o. [name]) Yevonne Pax Primary Care : [name] Other Clinician: Referring : Treating /Extender: [name] in Treatment: 0 Clinic Level of Care Assessment Items TOOL 1 Quantity Score X- 1 0 Use when EandM and Procedure is performed on INITIAL visit ASSESSMENTS - Nursing Assessment / Reassessment X- 1 20 General Physical Exam (combine w/ comprehensive assessment (listed just below) when performed on new pt. evals) X- 1 25 Comprehensive Assessment (HX, ROS, Risk Assessments, Wounds Hx, etc.) ASSESSMENTS - Wound and Skin Assessment / Reassessment []  - 0 Dermatologic / Skin Assessment (not related to wound area) ASSESSMENTS - Ostomy and/or Continence Assessment and Care []  - 0 Incontinence Assessment and Management []  - 0 Ostomy Care Assessment and Management (repouching, etc.) PROCESS - Coordination of Care X - Simple Patient / Family Education for ongoing care 1 15 []  - 0 Complex (extensive) Patient / Family Education for ongoing care []  - 0 Staff obtains Chiropractor, Records, T Results / Process Orders est []  - 0 Staff telephones HHA, Nursing Homes / Clarify orders / etc []  - 0 Routine Transfer to another Facility (non-emergent condition) []  - 0 Routine Hospital Admission (non-emergent condition) X- 1 15 New Admissions / Manufacturing engineer / Ordering NPWT Apligraf, etc. , []  - 0 Emergency Hospital Admission (emergent condition) PROCESS  - Special Needs []  - 0 Pediatric / Minor Patient Management []  - 0 Isolation Patient Management []  - 0 Hearing / Language / Visual special needs []  - 0 Assessment of Community assistance (transportation, [name], etc.) []  - 0 Additional assistance / Altered mentation []  - 0  Support Surface(s) Assessment (bed, cushion, seat, etc.) INTERVENTIONS - Miscellaneous []  - 0 External ear exam []  - 0 Patient Transfer (multiple staff / Vanderlinden Stanley / Similar devices) [name], [name] ([id number]) [id number] Page 3 of 9 []  - 0 Simple Staple / Suture removal (25 or less) []  - 0 Complex Staple / Suture removal (26 or more) []  - 0 Hypo/Hyperglycemic Management (do not check if billed separately) []  - 0 Ankle / Brachial Index (ABI) - do not check if billed separately Has the patient been seen at the hospital within the last three years: Yes Total Score: 75 Level Of Care: New/Established - Level 2 Electronic Signature(s) Signed: 05/23/2023 8:04:04 AM By: Yevonne Pax RN Entered By: Yevonne Pax on 05/16/2023 10:18:50 -------------------------------------------------------------------------------- Compression Therapy Details Patient Name: Date of Service: [name], [name] 05/16/2023 9:30 [name] M Medical Record Number: [id number] Patient Account Number: [id number] Date of Birth/Sex: Treating RN: [birth year] (76 y.o. [name]) Yevonne Pax Primary Care : [name] Other Clinician: Referring : Treating /Extender: [name] in Treatment: 0 Compression Therapy Performed for Wound Assessment: Wound #21 Left,Lateral Lower Leg Performed By: Clinician Yevonne Pax, RN Compression Type: Double Layer Post Procedure Diagnosis Same as Pre-procedure Electronic Signature(s) Signed: 05/23/2023 8:04:04 AM By: Yevonne Pax RN Entered By: Yevonne Pax on 05/16/2023  10:18:28 -------------------------------------------------------------------------------- Encounter Discharge Information Details Patient Name: Date of Service: [name] 05/16/2023 9:30 [name] M Medical Record Number: [id number] Patient Account Number: [id number] Date of Birth/Sex: Treating RN: 22-Aug-1946 (76 y.o. [name]) Yevonne Pax Primary Care : [name] Other Clinician: Referring : Treating /Extender: [name] in Treatment: 0 Encounter Discharge Information Items Discharge Condition: Stable Ambulatory Status: Ambulatory Discharge Destination: Home [name], [name] ([id number]) [id number] Page 4 of 9 Transportation: Private Auto Schedule Follow-up Appointment: Yes Clinical Summary of Care: Electronic Signature(s) Signed: 05/23/2023 8:04:04 AM By: Yevonne Pax RN Entered By: Yevonne Pax on 05/16/2023 10:19:35 -------------------------------------------------------------------------------- Lower Extremity Assessment Details Patient Name: Date of Service: [name] 05/16/2023 9:30 [name] M Medical Record Number: [id number] Patient Account Number: [id number] Date of Birth/Sex: Treating RN: 23-Feb-1946 (76 y.o. [name]) Yevonne Pax Primary Care : [name] Other Clinician: Referring : Treating /Extender: [name] in Treatment: 0 Edema Assessment Assessed: Kyra Searles: No] Franne Forts: No] Edema: [Left: Ye] [Right: s] Calf Left: Right: Point of Measurement: 37 cm From Medial Instep 39 cm Ankle Left: Right: Point of Measurement: 12 cm From Medial Instep 24 cm Knee To Floor Left: Right: From Medial Instep 51 cm Vascular Assessment Pulses: Dorsalis Pedis Palpable: [Left:Yes] Extremity colors, hair growth, and conditions: Extremity Color: [Left:Hyperpigmented] Hair Growth on Extremity: [Left:Yes] Temperature of Extremity: [Left:Warm] Capillary Refill:  [Left:< 3 seconds] Dependent Rubor: [Left:No] Blanched when Elevated: [Left:No No] Toe Nail Assessment Left: Right: Thick: No Discolored: No Deformed: Yes Improper Length and Hygiene: No Electronic Signature(s) Signed: 05/23/2023 8:04:04 AM By: Yevonne Pax RN [name], [name] ([id number]) [id number] Page 5 of 9 Entered By: Yevonne Pax on 05/16/2023 09:52:10 -------------------------------------------------------------------------------- Multi Wound Chart Details Patient Name: Date of Service: [name] 05/16/2023 9:30 [name] M Medical Record Number: [id number] Patient Account Number: [id number] Date of Birth/Sex: Treating RN: 02-Mar-1946 (76 y.o. [name]) Yevonne Pax Primary Care : [name] Other Clinician: Referring : Treating /Extender: [name] in Treatment: 0 Vital Signs Height(in): 75 Pulse(bpm): 53 Weight(lbs): 220 Blood Pressure(mmHg): 139/70 Body Mass Index(BMI): 27.5 Temperature(F): 98.3 Respiratory  Rate(breaths/min): 18 [21:Photos:] [N/[name]:N/[name]] Left, Lateral Lower Leg N/[name] N/[name] Wound Location: Gradually Appeared N/[name] N/[name] Wounding Event: Vasculitis N/[name] N/[name] Primary Etiology: Arrhythmia, Hypertension, Gout N/[name] N/[name] Comorbid History: 04/08/2023 N/[name] N/[name] Date Acquired: 0 N/[name] N/[name] Weeks of Treatment: Open N/[name] N/[name] Wound Status: No N/[name] N/[name] Wound Recurrence: 5x3.5x0.1 N/[name] N/[name] Measurements L x W x D (cm) 13.744 N/[name] N/[name] [name] (cm) : rea 1.374 N/[name] N/[name] Volume (cm) : Full Thickness Without Exposed N/[name] N/[name] Classification: Support Structures Medium N/[name] N/[name] Exudate Amount: Serosanguineous N/[name] N/[name] Exudate Type: red, brown N/[name] N/[name] Exudate Color: Small (1-33%) N/[name] N/[name] Granulation Amount: Red N/[name] N/[name] Granulation Quality: Large (67-100%) N/[name] N/[name] Necrotic Amount: Fat Layer (Subcutaneous Tissue): Yes N/[name] N/[name] Exposed Structures: Fascia: No Tendon: No Muscle: No Joint: No Bone: No None N/[name]  N/[name] Epithelialization: Treatment Notes Electronic Signature(s) Signed: 05/23/2023 8:04:04 AM By: Yevonne Pax RN Entered By: Yevonne Pax on 05/16/2023 09:53:00 [name] ([id number]) [id number] Page 6 of 9 -------------------------------------------------------------------------------- Multi-Disciplinary Care Plan Details Patient Name: Date of Service: [name] 05/16/2023 9:30 [name] M Medical Record Number: [id number] Patient Account Number: [id number] Date of Birth/Sex: Treating RN: 20-Apr-1946 (76 y.o. [name]) Yevonne Pax Primary Care : [name] Other Clinician: Referring : Treating /Extender: [name] in Treatment: 0 Active Inactive Wound/Skin Impairment Nursing Diagnoses: Knowledge deficit related to ulceration/compromised skin integrity Goals: Patient/caregiver will verbalize understanding of skin care regimen Date Initiated: 05/16/2023 Target Resolution Date: 06/16/2023 Goal Status: Active Ulcer/skin breakdown will have [name] volume reduction of 30% by week 4 Date Initiated: 05/16/2023 Target Resolution Date: 06/16/2023 Goal Status: Active Ulcer/skin breakdown will have [name] volume reduction of 50% by week 8 Date Initiated: 05/16/2023 Target Resolution Date: 07/16/2023 Goal Status: Active Ulcer/skin breakdown will have [name] volume reduction of 80% by week 12 Date Initiated: 05/16/2023 Target Resolution Date: 08/16/2023 Goal Status: Active Ulcer/skin breakdown will heal within 14 weeks Date Initiated: 05/16/2023 Target Resolution Date: 09/15/2023 Goal Status: Active Interventions: Assess patient/caregiver ability to obtain necessary supplies Assess patient/caregiver ability to perform ulcer/skin care regimen upon admission and as needed Assess ulceration(s) every visit Notes: Electronic Signature(s) Signed: 05/23/2023 8:04:04 AM By: Yevonne Pax RN Entered By: Yevonne Pax on 05/16/2023 09:54:28 Pain  Assessment Details -------------------------------------------------------------------------------- [name] ([id number]) [id number] Page 7 of 9 Patient Name: Date of Service: [name] 05/16/2023 9:30 [name] M Medical Record Number: [id number] Patient Account Number: [id number] Date of Birth/Sex: Treating RN: August 09, 1946 (76 y.o. [name]) Yevonne Pax Primary Care : [name] Other Clinician: Referring : Treating /Extender: [name] in Treatment: 0 Active Problems Location of Pain Severity and Description of Pain Patient Has Paino No Site Locations Pain Management and Medication Current Pain Management: Electronic Signature(s) Signed: 05/23/2023 8:04:04 AM By: Yevonne Pax RN Entered By: Yevonne Pax on 05/16/2023 09:40:34 -------------------------------------------------------------------------------- Patient/Caregiver Education Details Patient Name: Date of Service: [name] 8/8/2024andnbsp9:30 [name] M Medical Record Number: [id number] Patient Account Number: [id number] Date of Birth/Gender: Treating RN: 1946/06/01 (76 y.o. [name]) Yevonne Pax Primary Care Physician: [name] Other Clinician: Referring Physician: Treating Physician/Extender: [name] in Treatment: 0 Education Assessment Education Provided To: Patient Education Topics Provided Wound/Skin Impairment: Handouts: Caring for Your Ulcer Methods: Explain/Verbal Responses: State content correctly [name], [name] ([id number]) [id number] Page 8 of 9 Electronic Signature(s) Signed: 05/23/2023 8:04:04 AM By: Yevonne Pax RN Entered By: Yevonne Pax on 05/16/2023 09:54:52 -------------------------------------------------------------------------------- Wound Assessment Details Patient  Name: Date of Service: [name] 05/16/2023 9:30 [name] M Medical Record Number:  [id number] Patient Account Number: [id number] Date of Birth/Sex: Treating RN: [birth year] (76 y.o. [name]) Yevonne Pax Primary Care : [name] Other Clinician: Referring : Treating /Extender: [name] in Treatment: 0 Wound Status Wound Number: 21 Primary Etiology: Vasculitis Wound Location: Left, Lateral Lower Leg Wound Status: Open Wounding Event: Gradually Appeared Comorbid History: Arrhythmia, Hypertension, Gout Date Acquired: 04/08/2023 Weeks Of Treatment: 0 Clustered Wound: No Photos Wound Measurements Length: (cm) 5 Width: (cm) 3.5 Depth: (cm) 0.1 Area: (cm) 13.744 Volume: (cm) 1.374 % Reduction in Area: % Reduction in Volume: Epithelialization: None Tunneling: No Undermining: No Wound Description Classification: Full Thickness Without Exposed Suppor Exudate Amount: Medium Exudate Type: Serosanguineous Exudate Color: red, brown t Structures Foul Odor After Cleansing: No Slough/Fibrino Yes Wound Bed Granulation Amount: Small (1-33%) Exposed Structure Granulation Quality: Red Fascia Exposed: No Necrotic Amount: Large (67-100%) Fat Layer (Subcutaneous Tissue) Exposed: Yes Necrotic Quality: Adherent Slough Tendon Exposed: No Muscle Exposed: No Joint Exposed: No Bone Exposed: No Electronic Signature(s) Signed: 05/23/2023 8:04:04 AM By: Yevonne Pax RN [name],Signed: 05/23/2023 8:04:04 AM By: Yevonne Pax RN [name] ([id number]) [id number] Page 9 of 9 Entered By: Yevonne Pax on 05/16/2023 09:50:25 -------------------------------------------------------------------------------- Vitals Details Patient Name: Date of Service: [name] 05/16/2023 9:30 [name] M Medical Record Number: [id number] Patient Account Number: [id number] Date of Birth/Sex: Treating RN: 1946/06/26 (76 y.o. [name]) Yevonne Pax Primary Care : [name] Other Clinician: Referring : Treating  /Extender: [name] in Treatment: 0 Vital Signs Time Taken: 09:40 Temperature (F): 98.3 Height (in): 75 Pulse (bpm): 53 Source: Stated Respiratory Rate (breaths/min): 18 Weight (lbs): 220 Blood Pressure (mmHg): 139/70 Source: Stated Reference Range: 80 - 120 mg / dl Body Mass Index (BMI): 27.5 Electronic Signature(s) Signed: 05/23/2023 8:04:04 AM By: Yevonne Pax RN Entered By: Yevonne Pax on 05/16/2023 09:41:03

## 2023-05-23 NOTE — Progress Notes (Signed)
[name], [name] ([id number]) [id number] Page 1 of 13 Visit Report for 05/16/2023 Chief Complaint Document Details Patient Name: Date of Service: [name] [name] 05/16/2023 9:30 [name] Medical Record Number: [id number] Patient Account Number: [id number] Date of Birth/Sex: Treating RN: 12-21-1945 (76 y.o. [name]) Yevonne Pax Primary Care Provider: [name] Other Clinician: Referring Provider: Treating Provider/Extender: [name] in Treatment: 0 Information Obtained from: Patient Chief Complaint Left LE Ulcer Electronic Signature(s) Signed: 05/16/2023 10:12:47 AM By: Allen Derry PA-C Entered By: Allen Derry on 05/16/2023 10:12:47 -------------------------------------------------------------------------------- HPI Details Patient Name: Date of Service: [name], [name] 05/16/2023 9:30 [name] Medical Record Number: [id number] Patient Account Number: [id number] Date of Birth/Sex: Treating RN: 1946-04-07 (76 y.o. 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 the patient has had [name] vascular workup over 7 years ago and said everything was normal at that stage. He does not have any chronic problems except for cardiac issues which he sees [name] cardiologist in [place]. 08/15/2017 -- arterial and venous duplex studies still pending. 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 No evidence of SVT or DVT in bilateral lower extremities The patient has an appointment at the end of the month to get his arterial duplex study done 09/05/2017 -- the patient was seen at the vein and vascular office yesterday by Bary Castilla. ABI studies were notable for medial calcification and the toe brachial indices were normal and bilateral ankle-brachial) waveforms were normal with triphasic flow. After review of his venous studies he was not [name] candidate for laser ablation and his lymphedema was to be treated with compression stockings and lymphedema pump pumps 09/12/2017 -- had [name] low arterial study done at the  vein and vascular surgery -- unable to obtain reliable ABI is due to medial calcification. Bilateral toe [name] ([id number]) [id number] Page 2 of 13 indices were normal with the right being 1.01 and the left being 0.92 and the waveforms were triphasic bilaterally. he did get hold of 30-40 mm compression stockings but is unable to put these on. We will try and get him alternative compression stockings. 09/26/17- he is here in follow up evaluation of [name] right lower extremity ulcer;he is compliant in wearing compression stocking; ulcer almost epithelialized , anticipate healing next appointment Readmission: 11/17 point upon evaluation patient's wound currently that he is seeing [place] for today is [name] skin cancerous lesion that was cleared away by his dermatologist on the left medial calf region. He tells me that this is [name] very similar thing to what he had done previously in fact the last time he saw him in 2018 this was also what was going on at that point. Nonetheless he feels that based on what he seeing currently that this is just having [name] lot of harder time healing although it  is much closer to the surface than what he is experienced in the past.  He notes that the initial removal was in June 2022 which was this year this is now November and still has not closed. He does have some edema and definitely I think that there is some venous component to his slow healing here. Also think that we can do something better than Vaseline to try to help with getting this to clear up as quickly as possible. He does have [name] history of atrial fibrillation and is on Eliquis otherwise he really has no major medical problems that would affect wound healing. 09/07/2021 upon evaluation today patient actually appears to be doing significantly better after having wrapped him last week. Overall I think that this is making significant improvements at this time which is great news. I do not see any evidence of infection which is great news as well. No fevers, chills, nausea, vomiting, or diarrhea. 09/14/2021 upon evaluation today patient appears to be doing well with regard to his leg ulcer. He has been tolerating the dressing changes and overall I think that he is making excellent progress. I do not see any signs of active infection at this time. 09/21/2021 upon evaluation today patient actually appears to be making good progress with regard to his wound this is again measuring smaller today no debridement seems to be necessary. We have been using [name] silver collagen dressing and I think that is doing an awesome job. 09/28/2021 upon evaluation today patient appears to be doing well with regard to his leg currently. I do not see any signs of active infection at this time which is great news. No fevers, chills, nausea, vomiting, or diarrhea. I think this wound is very close to complete resolution. 10/12/2021 upon evaluation today patient actually appears to be doing awesome in regard to his leg ulcer. In fact this appears to be completely healed based on what I am seeing currently. I do not see any evidence  of active infection locally nor systemically at this time which is also great news. No fevers, chills, nausea, vomiting, or diarrhea. Readmission: 12/07/2021 upon evaluation today patient presents for readmission here in the clinic. He was discharged on 10/12/2021 is completely healed. Unfortunately this has reopened at this point and he is having continual issues with new blisters over both lower extremities. This is even worse than what we previously saw. Nonetheless we did actually check his ABIs today and it did reveal that his ABIs were 0.55 on the left and 0.57 on the right. Subsequently this is [name] definite change from his last arterial study which showed that he did have good blood flow at 1.01 on the right and 0.92 on the left and that was right at the beginning of 2019. Nonetheless based on what we see currently I do think he tolerated the 3 layer compression wrap but I do believe that we probably need to get him tested for his arterial flow in order to see where things stand and if there is something we can do there that would help prevent this from continue to be an ongoing issue. He did not utilize compression socks in the interim from when he was last here till this time. That something is probably going to need lifelong going forward as well. 3/9; patient presents for follow-up. He has no issues or complaints today. He tolerated the compression wrap well. He had ABIs with TBI's done. He denies signs of infection. 12/21/2021 upon evaluation today patient appears to be doing well with regard to the wounds on his legs. Both  are showing signs of significant improvement which is great news although I do believe some sharp debridement would be of benefit here as well. 12/28/2021 upon evaluation today patient appears to be doing well with regard to his wounds. Everything is showing signs of excellent improvement which I am very pleased about. I think that we are headed in the right direction here.  Fortunately there does not appear to be any evidence of infection which is great news there is [name] little bit of hypergranulation. 01/04/2022 upon evaluation today patient appears to be doing well with regard to his wounds 2 of them are healed 1 is almost so and the other 1 is significantly better. Overall I am extremely pleased with where we stand and I think that he is making excellent progress here. I do not see any evidence of active infection locally nor systemically at this time. 01-16-2022 upon evaluation today patient's wound on the left leg is showing signs of doing quite well. Has not completely cleared at this point but it is much improved. Fortunately I do not see any signs of infection at this time. No fevers, chills, nausea, vomiting, or diarrhea. 01-23-2022 upon evaluation today patient's wound of the left leg actually appears to be pretty much completely healed which is great news. I do not see any signs of active infection locally or systemically which is excellent. With that being said on the right leg what wound is measuring smaller the other 1 is [name] new wound that just showed up fortunately its not too bad. Has been using Xeroform here and that seems to be doing decently well which is great news. Unfortunately his blood pressure is significantly high we gave him the readings for the past 4-5 visits as well as [name] recommendation to make an appointment to go discuss this with his primary care provider patient states that he is going to look into doing this. 01-30-2022 upon evaluation today patient appears to be doing well with regard to his left leg everything appears to be healed. On the right leg the more anterior wound is healed the more medial wound that I been concerned about [name] possible skin cancer unfortunately still does not look great to me. I do believe that we should probably do [name] biopsy I have talked about it with him [name] few times I think though it is probably time to go ahead and do  this at this point. 02-09-2022 upon evaluation today patient appears to be doing well with regard to his legs. On the left this appears to be completely healed. On the right he does have 2 areas and be perfectly honest one of them is [name] skin cancer that he is going to the Mohs surgery clinic for the other seems to be healing nicely. Readmission: 08-02-2022 upon evaluation today patient appears for reevaluation here in our clinic concerning issues that he has been having with wounds over the bilateral lower extremities. I last saw him in May 2023 and at that point we had him completely healed. Unfortunately he is tells me this has broken down to some degree since that point. 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 He tells me this 1 seems to just be getting bigger and bigger not improving. Fortunately he is not having  any significant pain which is good news he does have quite [name] bit of swelling and he tells me that his fluid pills are not recommended for him to take daily but just in 3-day intervals here and there. 08-09-2022 upon evaluation today patient appears to be doing still somewhat poorly in regard to his legs although in general he does not appear to be feeling as good as he has been. Fortunately there does not appear to be any signs of infection which is good news. With that being said he is having some issues here with having and overall poor feeling in general which again is good I think going to be the biggest complicating factor. He actually seems to be coughing I do not hear any wheezing right now I did listen to his chest he did not have good airflow down low however makes me suspicious for bronchitis or even possibly pneumonia which could be part of what is going on here as well.  Fortunately I do not see any evidence of anything worsening in regard to his legs but I definitely believe that he needs to continue with the compression wraps he took them off yesterday to shower has not had anything on for 24 hours that is why his legs are so swollen today. With regard to his pathology report I did review that it showed some squamous abnormality but no signs of distinct carcinoma. With that being said it was [name], [name] ([id number]) [phone] Page 3 of 13 saying that it could be adjacent to [name] squamous cell carcinoma nonetheless my suggestion is can be that we have the patient take copy of this report and give it to his Mohs surgeon in order for them to see if there is anything they feel like needs to be done further. With that being said right now I feel like the primary thing is going to be for [place] to try to get his swelling down and keep that down into that hand since he is having so much drainage I believe we can have to bring him in for dressing changes twice [name] week doing [name] nurse visit on Mondays. 11/9; since the patient was last here he spent the night in the emergency room he received IV Lasix. Also received antibiotics although he was not discharged on either 1 of these. He also saw his cardiology office who put him on regular Lasix 20 mg [previously on as needed Lasix 20 mg]. Per our intake nurse the swelling in his legs is remarkably better but he still has bilateral lower extremity wounds. He still has wounds on the bilateral lower extremities most problematically on the left lateral calf. He has been using silver alginate under 3 layer compression. 08-23-2022 upon evaluation today patient appears to be doing much better than the last time I saw him 2 weeks ago. At that point I was very concerned about how he was doing he did see Dr. Sherrie Mustache his primary care provider they got him on some blood pressure medication in general his color and  overall appearance looks to be doing much improved compared to the last time I saw him. 09-04-2022 upon evaluation today patient appears to be doing well currently in regard to his wounds. Everything is showing signs of improvement which is great news. Fortunately there does not appear to be any signs of active infection locally or systemically at this time. No fevers, chills, nausea, vomiting, or diarrhea. 09-10-2022 upon evaluation today patient appears to be doing  better in regard to his wounds although the [facility] was extremely stuck to the wound bed. Fortunately there does not appear to be any signs of infection locally or systemically at this time which is great news. No fevers, chills, nausea, vomiting, or diarrhea. 09-17-2022 upon evaluation today patient appears to be doing well currently in regard to his wounds in general. The right leg actually showing signs of excellent improvement and very pleased with where things stand in that regard. Fortunately I do not see any evidence of infection locally or systemically at this time which is great news. No fevers, chills, nausea, vomiting, or diarrhea. 09-24-2022 upon evaluation today patient appears to be doing well currently in regard to his wounds. Things look to be doing quite well. 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 He is seeing his dermatologist tomorrow in that regard. With that being said other than that however he seems to really be making some pretty good progress here which is good news. No fevers, chills, nausea, vomiting, or diarrhea. 12/26; the patient has 2 open wounds remaining on the left leg. One is on the left anterior mid tibia and the other is on the right lateral knee just outside of the popliteal fossa. 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  The patient has been to see dermatology Dr. Adolphus Birchwood who apparently is making him [name] referral to the [name]. He does not yet have an appointment 10-12-2022 upon evaluation today patient appears to be doing well currently in regard to his wound. He has been tolerating the dressing changes without complication and overall feel like we are headed in the right direction. Fortunately I do not see any signs of infection locally or systemically at this time which is great news. No fevers, chills, nausea, vomiting, or diarrhea. 10-23-2022 upon evaluation today patient appears to be doing well currently in regard to his wound. He has been tolerating the dressing changes without complication. Fortunately there does not appear to be any signs of active infection locally nor systemically which is great news and overall I am extremely pleased with where we stand currently. No fevers, chills, nausea, vomiting, or diarrhea. 10-26-2022 upon evaluation today patient appears to be doing well currently in regard to his wounds. Everything is showing signs of improvement and this is great news. Fortunately I see no evidence of active infection systemically. He does seem to be doing much better in regard to the local infection in regard to his leg. The smell is also greatly improved. Overall I am extremely happy with where we stand today. This is after just [name] few days with the antibiotic on board. 11-05-2022 upon evaluation today patient appears to be doing well currently in regard to his wounds although the wound where they performed the Mohs surgery does look [name] little bit hyper granulated I think switching to [facility] may be better for him. He voiced understanding. Fortunately there does not appear to be any evidence of active infection locally nor systemically at this time. 11-12-2022 upon evaluation today patient appears to be doing better in regard to both wounds he has been tolerating the dressing  changes without complication. There is no signs of infection and in general I think you are doing quite well. No fevers, chills, nausea, vomiting, or diarrhea. 11-20-2022 upon  evaluation today patient appears to be doing well currently in regard to his wounds. He has been tolerating the dressing changes without complication. Fortunately there does not appear to be any signs of infection at this time. No fevers, chills, nausea, vomiting, or diarrhea. 11-27-2022 upon evaluation today patient appears to be doing somewhat poorly in regard to his leg in general he has [name] lot of areas where he looks like he had some spots that popped up. There with regard to new possible blisters. In general I am actually very concerned about the fact that the wrap may be causing some irritation here. I think that we can try to not do the wrap for 1 week, and given the prescription for mupirocin ointment which I will send into the pharmacy for him. 12-04-2022 upon evaluation today patient appears to be doing well currently in regard to his wounds in fact he appears to be pretty much completely healed based on what I am seeing at this point. I do not see any signs of active infection locally nor systemically at this time which is great news. No fevers, chills, nausea, vomiting, or diarrhea. 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 I discussed that with him today he is aware of the situation.-2024 upon evaluation patient presents for readmission here in the clinic actually last saw him December 04, 2022. With that being said previously his dermatologist had asked that if there was something the need to be addressed from [name] dermatology standpoint and specifically biopsy that we make referral back to them to allow them to do it which I am definitely happy to do. 01-31-2023  upon evaluation today patient appears to be doing well currently in regard to his wound in fact this looks better he did see Dr. Adolphus Birchwood his dermatologist yesterday and they did perform [name] biopsy. With that being said he actually looks like he is doing much better Dr. Adolphus Birchwood feels like this may not be [name] cancerous lesion which will be very good news at the same time I definitely wanted to make sure especially considering his history and the way this looks when we saw him last week. Nonetheless I am extremely pleased with the fact that he is doing so much better at this point. 02-07-2023 upon evaluation today patient appears to be doing well currently in regard to his wound from the standpoint of size it has not gotten any larger. With that being said he does have some issues here still with what appears to be potentially some infection. Wound does not look quite as good as it did last week. We are still waiting on the results for the pathology. I did put [name] call into dermatology but I have not heard anything back from them as of yet. 02-14-2023 upon evaluation today patient appears to be doing well with regard to his wound infection is definitely under control and looks much better. Fortunately I do not see any signs of systemic infection with anyone locally I think this is improved greatly. 02-19-2023 upon evaluation today patient appears to be doing well currently in regard to his wound which is actually measuring smaller and looking much better. Fortunately I do not see any evidence of active infection locally nor systemically which is great news and overall I am extremely pleased with where we stand today. 02-26-2023  upon evaluation today patient appears to be doing well currently in regard to his wound. He is actually been tolerating the dressing changes without complication. Fortunately there does not appear to be any signs of active infection locally nor systemically which is great news and overall I am  extremely [name], [name] ([id number]) [id number] Page 4 of 13 pleased with where we stand currently. 03-12-2023 upon evaluation today patient's wound actually showed signs of excellent improvement. I am very pleased with where we stand I do believe that we are making good progress here. I do not see any signs of active infection. 03-19-2023 upon evaluation today patient actually appears to be making excellent progress in regard to his leg and getting this closed. In fact is just [name] very small area that is actually open at this point. I am actually very pleased with what we are seeing today. 03-26-2023 upon evaluation today patient appears to be doing well currently in regard to his leg which is actually showing signs of being completely healed. Fortunately I do not see any signs of active infection locally or systemically which is great news and in general I do believe that we are moving in the right direction here. Readmission: 05-16-2023 upon evaluation today patient presents for reevaluation here in the clinic concerning [name] wound on the left anterior/lateral lower extremity. This is similar to where the wound was last time I saw him but not exactly the same. Fortunately there does not appear to be any signs of active infection at this time which is great news. The patient's past medical history really has not changed significantly since last time I saw him. Electronic Signature(s) Signed: 05/16/2023 4:54:01 PM By: Allen Derry PA-C Entered By: Allen Derry on 05/16/2023 16:54:01 -------------------------------------------------------------------------------- Physical Exam Details Patient Name: Date of Service: [name] Feliz Beam [name] 05/16/2023 9:30 [name] Medical Record Number: [id number] Patient Account Number: [id number] Date of Birth/Sex: Treating RN: [birth year] (76 y.o. [name]) Yevonne Pax Primary Care Provider: [name] Other Clinician: Referring Provider: Treating  Provider/Extender: [name] in Treatment: 0 Constitutional sitting or standing blood pressure is within target range for patient.. pulse regular and within target range for patient.Marland Kitchen respirations regular, non-labored and within target range for patient.Marland Kitchen temperature within target range for patient.. Well-nourished and well-hydrated in no acute distress. Eyes conjunctiva clear no eyelid edema noted. pupils equal round and reactive to light and accommodation. Ears, Nose, Mouth, and Throat no gross abnormality of ear auricles or external auditory canals. normal hearing noted during conversation. mucus membranes moist. Respiratory normal breathing without difficulty. Cardiovascular 2+ dorsalis pedis/posterior tibialis pulses. 1+ pitting edema of the bilateral lower extremities. Musculoskeletal normal gait and posture. no significant deformity or arthritic changes, no loss or range of motion, no clubbing. Psychiatric this patient is able to make decisions and demonstrates good insight into disease process. Alert and Oriented x 3. pleasant and cooperative. Notes Upon inspection patient's wound bed actually showed signs of having some hypergranulation there was also some purulent drainage noted at this point. I do believe that it is very likely he does have an infection at this point. He is done very well in the past with Levaquin and he would like to give that [name] shot again if we need to do an antibiotic I definitely think that is okay. I am going to going send that into the pharmacy. Electronic Signature(s) Signed: 05/16/2023 4:54:29 PM By: Allen Derry PA-C Entered By: Allen Derry on 05/16/2023 16:54:29 [name],  [name] ([id number]) [id number] Page 5 of 13 -------------------------------------------------------------------------------- Physician Orders Details Patient Name: Date of Service: [name] [name] 05/16/2023 9:30 [name] Medical Record  Number: [id number] Patient Account Number: [id number] Date of Birth/Sex: Treating RN: [birth year] (76 y.o. [name]) Yevonne Pax Primary Care Provider: [name] Other Clinician: Referring Provider: Treating Provider/Extender: [name] in Treatment: 0 Verbal / Phone Orders: No Diagnosis Coding ICD-10 Coding Code Description [phone] Chronic venous hypertension (idiopathic) with ulcer and inflammation of bilateral lower extremity L97.822 Non-pressure chronic ulcer of other part of left lower leg with fat layer exposed I73.89 Other specified peripheral vascular diseases I48.0 Paroxysmal atrial fibrillation I10 Essential (primary) hypertension Follow-up Appointments Return Appointment in 1 week. Bathing/ Shower/ Hygiene May shower; gently cleanse wound with antibacterial soap, rinse and pat dry prior to dressing wounds Edema Control - Lymphedema / Segmental Compressive Device / Other Patient to wear own compression stockings. Remove compression stockings every night before going to bed and put on every morning when getting up. - right Elevate, Exercise Daily and [name] void Standing for Long Periods of Time. Elevate legs to the level of the heart and pump ankles as often as possible Elevate leg(s) parallel to the floor when sitting. Wound Treatment Wound #21 - Lower Leg Wound Laterality: Left, Lateral Cleanser: Soap and Water 2 x Per Week/30 Days Discharge Instructions: Gently cleanse wound with antibacterial soap, rinse and pat dry prior to dressing wounds Cleanser: Wound Cleanser 2 x Per Week/30 Days Discharge Instructions: Wash your hands with soap and water. Remove old dressing, discard into plastic bag and place into trash. Cleanse the wound with Wound Cleanser prior to applying [name] clean dressing using gauze sponges, not tissues or cotton balls. Do not scrub or use excessive force. Pat dry using gauze sponges, not tissue or cotton balls. Topical: Triamcinolone  Acetonide Cream, 0.1%, 15 (g) tube 2 x Per Week/30 Days Discharge Instructions: Apply as directed by provider. Prim Dressing: Hydrofera Blue Ready Transfer Foam, 2.5x2.5 (in/in) 2 x Per Week/30 Days ary Discharge Instructions: Apply Hydrofera Blue Ready to wound bed as directed Secondary Dressing: Zetuvit Plus 4x8 (in/in) 2 x Per Week/30 Days Compression Wrap: Urgo K2, two layer compression system, regular 2 x Per Week/30 Days Patient Medications llergies: No Known Allergies [name] Notifications Medication Indication Start End 05/16/2023 levofloxacin DOSE 1 - oral 500 mg tablet - 1 tablet oral once daily x 14 days [name], [name] ([id number]) [id number] Page 6 of 13 Electronic Signature(s) Signed: 05/16/2023 10:30:15 AM By: Allen Derry PA-C Entered By: Allen Derry on 05/16/2023 10:30:15 -------------------------------------------------------------------------------- Problem List Details Patient Name: Date of Service: [name] [name], [name] 05/16/2023 9:30 [name] Medical Record Number: [id number] Patient Account Number: [id number] Date of Birth/Sex: Treating RN: 1946/03/24 (76 y.o. 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/05/2023 No Yes I73.89 Other specified peripheral vascular diseases 05/16/2023 No Yes I48.0 Paroxysmal atrial fibrillation 05/16/2023 No Yes I10 Essential (primary) hypertension 05/16/2023 No Yes Inactive Problems Resolved Problems Electronic Signature(s) Signed: 05/16/2023 4:55:13 PM By:  Allen Derry PA-C Previous Signature: 05/16/2023 10:12:40 AM Version By: Allen Derry PA-C  Entered By: Allen Derry on 05/16/2023 16:55:13 [name] ([id number]) [id number] Page 7 of 13 -------------------------------------------------------------------------------- Progress Note Details Patient Name: Date of Service: [name] [name] 05/16/2023 9:30 [name] Medical Record Number: [id number] Patient Account Number: [id number] Date of Birth/Sex: Treating RN: 1946/08/16 (76 y.o. 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 the patient has had [name] vascular workup over 7 years ago and said everything was normal at that stage. He does not have any chronic problems except for cardiac issues which he sees [name] cardiologist in [place]. 08/15/2017 -- arterial and venous duplex studies still pending. 08/23/2017 -- venous reflux studies done on 08/13/2017 shows venous incompetence throughout the left lower extremity deep system and focally at the left saphenofemoral junction. 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 No evidence of SVT or DVT in bilateral lower extremities The patient has an appointment at the end of the month to get his arterial duplex study done 09/05/2017 -- the patient was seen at the vein and vascular office yesterday by  Bary Castilla. ABI studies were notable for medial calcification and the toe brachial indices were normal and bilateral ankle-brachial) waveforms were normal with triphasic flow. After review of his venous studies he was not [name] candidate for laser ablation and his lymphedema was to be treated with compression stockings and lymphedema pump pumps 09/12/2017 -- had [name] low arterial study done at the [facility] -- unable to obtain reliable ABI is due to medial calcification. Bilateral toe indices were normal with the right being 1.01 and the left being 0.92 and the waveforms were triphasic bilaterally. he did get hold of 30-40 mm compression stockings but is unable to put these on. We will try and get him alternative compression stockings. 09/26/17- he is here in follow up evaluation of [name] right lower extremity ulcer;he is compliant in wearing compression stocking; ulcer almost epithelialized , anticipate healing next appointment Readmission: 11/17 point upon evaluation patient's wound currently that he is seeing [place] for today is [name] skin cancerous lesion that was cleared away by his dermatologist on the left medial calf region. He tells me that this is [name] very similar thing to what he had done previously in fact the last time he saw him in 2018 this was also what was going on at that point. Nonetheless he feels that based on what he seeing currently that this is just having [name] lot of harder time healing although it is much closer to the surface than what he is experienced in the past. He notes that the initial removal was in June 2022 which was this year this is now November and still has not closed. He does have some edema and definitely I think that there is some venous component to his slow healing here. Also think that we can do something better than Vaseline to try to help with getting this to clear up as quickly as possible. He does have [name] history of atrial fibrillation and is on  Eliquis otherwise  he really has no major medical problems that would affect wound healing. 09/07/2021 upon evaluation today patient actually appears to be doing significantly better after having wrapped him last week. Overall I think that this is making significant improvements at this time which is great news. I do not see any evidence of infection which is great news as well. No fevers, chills, nausea, vomiting, or diarrhea. 09/14/2021 upon evaluation today patient appears to be doing well with regard to his leg ulcer. He has been tolerating the dressing changes and overall I think that he is making excellent progress. I do not see any signs of active infection at this time. 09/21/2021 upon evaluation today patient actually appears to be making good progress with regard to his wound this is again measuring smaller today no debridement seems to be necessary. We have been using [name] silver collagen dressing and I think that is doing an awesome job. 09/28/2021 upon evaluation today patient appears to be doing well with regard to his leg currently. I do not see any signs of active infection at this time which is great news. No fevers, chills, nausea, vomiting, or diarrhea. I think this wound is very close to complete resolution. 10/12/2021 upon evaluation today patient actually appears to be doing awesome in regard to his leg ulcer. In fact this appears to be completely healed based on what I am seeing currently. I do not see any evidence of active infection locally nor systemically at this time which is also great news. No fevers, chills, nausea, vomiting, or diarrhea. Readmission: 12/07/2021 upon evaluation today patient presents for readmission here in the clinic. He was discharged on 10/12/2021 is completely healed. Unfortunately this has reopened at this point and he is having continual issues with new blisters over both lower extremities. This is even worse than what we previously saw. Nonetheless we did  actually check his ABIs today and it did reveal that his ABIs were 0.55 on the left and 0.57 on the right. Subsequently this is [name] definite change from his last arterial study which showed that he did have good blood flow at 1.01 on the right and 0.92 on the left and that was right at the beginning of 2019. Nonetheless based on what we see currently I do think he tolerated the 3 layer compression wrap but I do believe that we probably need to get him tested for his arterial flow in order to see where things stand and if there is something we can do there that would help prevent this from continue to be an [name], [name] ([id number]) [id number] Page 8 of 13 ongoing issue. He did not utilize compression socks in the interim from when he was last here till this time. That something is probably going to need lifelong going forward as well. 3/9; patient presents for follow-up. He has no issues or complaints today. He tolerated the compression wrap well. He had ABIs with TBI's done. He denies signs of infection. 12/21/2021 upon evaluation today patient appears to be doing well with regard to the wounds on his legs. Both are showing signs of significant improvement which is great news although I do believe some sharp debridement would be of benefit here as well. 12/28/2021 upon evaluation today patient appears to be doing well with regard to his wounds. Everything is showing signs of excellent improvement which I am very pleased about. I think that we are headed in the right direction here. Fortunately there does not appear to be any  evidence of infection which is great news there is [name] little bit of hypergranulation. 01/04/2022 upon evaluation today patient appears to be doing well with regard to his wounds 2 of them are healed 1 is almost so and the other 1 is significantly better. Overall I am extremely pleased with where we stand and I think that he is making excellent progress  here. I do not see any evidence of active infection locally nor systemically at this time. 01-16-2022 upon evaluation today patient's wound on the left leg is showing signs of doing quite well. Has not completely cleared at this point but it is much improved. Fortunately I do not see any signs of infection at this time. No fevers, chills, nausea, vomiting, or diarrhea. 01-23-2022 upon evaluation today patient's wound of the left leg actually appears to be pretty much completely healed which is great news. I do not see any signs of active infection locally or systemically which is excellent. With that being said on the right leg what wound is measuring smaller the other 1 is [name] new wound that just showed up fortunately its not too bad. Has been using Xeroform here and that seems to be doing decently well which is great news. Unfortunately his blood pressure is significantly high we gave him the readings for the past 4-5 visits as well as [name] recommendation to make an appointment to go discuss this with his primary care provider patient states that he is going to look into doing this. 01-30-2022 upon evaluation today patient appears to be doing well with regard to his left leg everything appears to be healed. On the right leg the more anterior wound is healed the more medial wound that I been concerned about [name] possible skin cancer unfortunately still does not look great to me. I do believe that we should probably do [name] biopsy I have talked about it with him [name] few times I think though it is probably time to go ahead and do this at this point. 02-09-2022 upon evaluation today patient appears to be doing well with regard to his legs. On the left this appears to be completely healed. On the right he does have 2 areas and be perfectly honest one of them is [name] skin cancer that he is going to the Mohs surgery clinic for the other seems to be healing nicely. Readmission: 08-02-2022 upon evaluation today patient appears  for reevaluation here in our clinic concerning issues that he has been having with wounds over the bilateral lower extremities. I last saw him in May 2023 and at that point we had him completely healed. Unfortunately he is tells me this has broken down to some degree since that point. 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 He tells me this 1 seems to just be getting bigger and bigger not improving. Fortunately he is not having any significant pain which is good news he does have quite [name] bit of swelling and he tells me that his fluid pills are not recommended for him to take daily but just in 3-day intervals here and there. 08-09-2022 upon evaluation today patient appears to be doing still somewhat poorly in regard to his legs although in general he does not appear to be feeling as good as he has  been. Fortunately there does not appear to be any signs of infection which is good news. With that being said he is having some issues here with having and overall poor feeling in general which again is good I think going to be the biggest complicating factor. He actually seems to be coughing I do not hear any wheezing right now I did listen to his chest he did not have good airflow down low however makes me suspicious for bronchitis or even possibly pneumonia which could be part of what is going on here as well. Fortunately I do not see any evidence of anything worsening in regard to his legs but I definitely believe that he needs to continue with the compression wraps he took them off yesterday to shower has not had anything on for 24 hours that is why his legs are so swollen today. With regard to his pathology report I did review that it showed some squamous abnormality but no signs of distinct carcinoma.  With that being said it was saying that it could be adjacent to [name] squamous cell carcinoma nonetheless my suggestion is can be that we have the patient take copy of this report and give it to his Mohs surgeon in order for them to see if there is anything they feel like needs to be done further. With that being said right now I feel like the primary thing is going to be for [place] to try to get his swelling down and keep that down into that hand since he is having so much drainage I believe we can have to bring him in for dressing changes twice [name] week doing [name] nurse visit on Mondays. 11/9; since the patient was last here he spent the night in the emergency room he received IV Lasix. Also received antibiotics although he was not discharged on either 1 of these. He also saw his cardiology office who put him on regular Lasix 20 mg [previously on as needed Lasix 20 mg]. Per our intake nurse the swelling in his legs is remarkably better but he still has bilateral lower extremity wounds. He still has wounds on the bilateral lower extremities most problematically on the left lateral calf. He has been using silver alginate under 3 layer compression. 08-23-2022 upon evaluation today patient appears to be doing much better than the last time I saw him 2 weeks ago. At that point I was very concerned about how he was doing he did see Dr. Sherrie Mustache his primary care provider they got him on some blood pressure medication in general his color and overall appearance looks to be doing much improved compared to the last time I saw him. 09-04-2022 upon evaluation today patient appears to be doing well currently in regard to his wounds. Everything is showing signs of improvement which is great news. Fortunately there does not appear to be any signs of active infection locally or systemically at this time. No fevers, chills, nausea, vomiting, or diarrhea. 09-10-2022 upon evaluation today patient appears to be doing better in regard  to his wounds although the [facility] was extremely stuck to the wound bed. Fortunately there does not appear to be any signs of infection locally or systemically at this time which is great news. No fevers, chills, nausea, vomiting, or diarrhea. 09-17-2022 upon evaluation today patient appears to be doing well currently in regard to his wounds in general. The right leg actually showing signs of excellent improvement and very pleased with where things stand in  that regard. Fortunately I do not see any evidence of infection locally or systemically at this time which is great news. No fevers, chills, nausea, vomiting, or diarrhea. 09-24-2022 upon evaluation today patient appears to be doing well currently in regard to his wounds. Things look to be doing quite well. 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 He is seeing his dermatologist tomorrow in that regard. With that being said other than that however he seems to really be making some pretty good progress here which is good news. No fevers, chills, nausea, vomiting, or diarrhea. 12/26; the patient has 2 open wounds remaining on the left leg. One is on the left anterior mid tibia and the other is on the right lateral knee just outside of the popliteal fossa. 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 The patient has been to see dermatology Dr. Adolphus Birchwood who apparently is making him [name] referral to the [facility]. He does not yet have an appointment 10-12-2022 upon evaluation today patient appears to be doing well currently in regard to his wound. He has been tolerating the dressing changes without complication and overall feel like we are headed in the right direction. Fortunately I do not see any signs of infection locally or systemically at this time which is great  news. No fevers, chills, nausea, vomiting, or diarrhea. 10-23-2022 upon evaluation today patient appears to be doing well currently in regard to his wound. He has been tolerating the dressing changes without [name], [name] ([id number]) [id number] Page 9 of 13 complication. Fortunately there does not appear to be any signs of active infection locally nor systemically which is great news and overall I am extremely pleased with where we stand currently. No fevers, chills, nausea, vomiting, or diarrhea. 10-26-2022 upon evaluation today patient appears to be doing well currently in regard to his wounds. Everything is showing signs of improvement and this is great news. Fortunately I see no evidence of active infection systemically. He does seem to be doing much better in regard to the local infection in regard to his leg. The smell is also greatly improved. Overall I am extremely happy with where we stand today. This is after just [name] few days with the antibiotic on board. 11-05-2022 upon evaluation today patient appears to be doing well currently in regard to his wounds although the wound where they performed the Mohs surgery does look [name] little bit hyper granulated I think switching to [facility] may be better for him. He voiced understanding. Fortunately there does not appear to be any evidence of active infection locally nor systemically at this time. 11-12-2022 upon evaluation today patient appears to be doing better in regard to both wounds he has been tolerating the dressing changes without complication. There is no signs of infection and in general I think you are doing quite well. No fevers, chills, nausea, vomiting, or diarrhea. 11-20-2022 upon evaluation today patient appears to be doing well currently in regard to his wounds. He has been tolerating the dressing changes without complication. Fortunately there does not appear to be any signs of infection at this time. No  fevers, chills, nausea, vomiting, or diarrhea. 11-27-2022 upon evaluation today patient appears to be doing somewhat poorly in regard to his leg in general he has [name] lot of areas where he looks  like he had some spots that popped up. There with regard to new possible blisters. In general I am actually very concerned about the fact that the wrap may be causing some irritation here. I think that we can try to not do the wrap for 1 week, and given the prescription for mupirocin ointment which I will send into the pharmacy for him. 12-04-2022 upon evaluation today patient appears to be doing well currently in regard to his wounds in fact he appears to be pretty much completely healed based on what I am seeing at this point. I do not see any signs of active infection locally nor systemically at this time which is great news. No fevers, chills, nausea, vomiting, or diarrhea. 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 I discussed that with him today he is aware of the situation.-2024 upon evaluation patient presents for readmission here in the clinic actually last saw him December 04, 2022. With that being said previously his dermatologist had asked that if there was something the need to be addressed from [name] dermatology standpoint and specifically biopsy that we make referral back to them to allow them to do it which I am definitely happy to do. 01-31-2023 upon evaluation today patient appears to be doing well currently in regard to his wound in fact this looks better he did see Dr. Adolphus Birchwood his dermatologist yesterday and they did perform [name] biopsy. With that being said he actually looks like he is doing much better Dr. Adolphus Birchwood feels like this may not be [name] cancerous lesion which will be very good news at the same time I definitely wanted to make sure especially  considering his history and the way this looks when we saw him last week. Nonetheless I am extremely pleased with the fact that he is doing so much better at this point. 02-07-2023 upon evaluation today patient appears to be doing well currently in regard to his wound from the standpoint of size it has not gotten any larger. With that being said he does have some issues here still with what appears to be potentially some infection. Wound does not look quite as good as it did last week. We are still waiting on the results for the pathology. I did put [name] call into dermatology but I have not heard anything back from them as of yet. 02-14-2023 upon evaluation today patient appears to be doing well with regard to his wound infection is definitely under control and looks much better. Fortunately I do not see any signs of systemic infection with anyone locally I think this is improved greatly. 02-19-2023 upon evaluation today patient appears to be doing well currently in regard to his wound which is actually measuring smaller and looking much better. Fortunately I do not see any evidence of active infection locally nor systemically which is great news and overall I am extremely pleased with where we stand today. 02-26-2023 upon evaluation today patient appears to be doing well currently in regard to his wound. He is actually been tolerating the dressing changes without complication. Fortunately there does not appear to be any signs of active infection locally nor systemically which is great news and overall I am extremely pleased with where we stand currently. 03-12-2023 upon evaluation today patient's wound actually showed signs of excellent improvement. I am very pleased  with where we stand I do believe that we are making good progress here. I do not see any signs of active infection. 03-19-2023 upon evaluation today patient actually appears to be making excellent progress in regard to his leg and getting this  closed. In fact is just [name] very small area that is actually open at this point. I am actually very pleased with what we are seeing today. 03-26-2023 upon evaluation today patient appears to be doing well currently in regard to his leg which is actually showing signs of being completely healed. Fortunately I do not see any signs of active infection locally or systemically which is great news and in general I do believe that we are moving in the right direction here. Readmission: 05-16-2023 upon evaluation today patient presents for reevaluation here in the clinic concerning [name] wound on the left anterior/lateral lower extremity. This is similar to where the wound was last time I saw him but not exactly the same. Fortunately there does not appear to be any signs of active infection at this time which is great news. The patient's past medical history really has not changed significantly since last time I saw him. Patient History Information obtained from Patient. Allergies No Known Allergies Family History Cancer - Father, Diabetes - Paternal Grandparents, Heart Disease - Father, Hypertension - Mother,Father,Siblings, No family history of Hereditary Spherocytosis, Kidney Disease, Lung Disease, Seizures, Stroke, Thyroid Problems, Tuberculosis. Social History Never smoker, Marital Status - Divorced, Alcohol Use - Rarely, Drug Use - No History, Caffeine Use - Never. Medical History Cardiovascular Patient has history of Arrhythmia - [name]-fib, Hypertension Denies history of Angina Endocrine Denies history of Type II Diabetes Musculoskeletal Patient has history of Gout [name], SCHNEIDERS [name] ([id number]) [id number] Page 10 of 13 Medical [name] Surgical History Notes nd Endocrine pt states never told he was [name] diabetic Objective Constitutional sitting or standing blood pressure is within target range for patient.. pulse regular and within target range for patient.Marland Kitchen respirations regular,  non-labored and within target range for patient.Marland Kitchen temperature within target range for patient.. Well-nourished and well-hydrated in no acute distress. Vitals Time Taken: 9:40 AM, Height: 75 in, Source: Stated, Weight: 220 lbs, Source: Stated, BMI: 27.5, Temperature: 98.3 F, Pulse: 53 bpm, Respiratory Rate: 18 breaths/min, Blood Pressure: 139/70 mmHg. Eyes conjunctiva clear no eyelid edema noted. pupils equal round and reactive to light and accommodation. Ears, Nose, Mouth, and Throat no gross abnormality of ear auricles or external auditory canals. normal hearing noted during conversation. mucus membranes moist. Respiratory normal breathing without difficulty. Cardiovascular 2+ dorsalis pedis/posterior tibialis pulses. 1+ pitting edema of the bilateral lower extremities. Musculoskeletal normal gait and posture. no significant deformity or arthritic changes, no loss or range of motion, no clubbing. Psychiatric this patient is able to make decisions and demonstrates good insight into disease process. Alert and Oriented x 3. pleasant and cooperative. General Notes: Upon inspection patient's wound bed actually showed signs of having some hypergranulation there was also some purulent drainage noted at this point. I do believe that it is very likely he does have an infection at this point. He is done very well in the past with Levaquin and he would like to give that [name] shot again if we need to do an antibiotic I definitely think that is okay. I am going to going send that into the pharmacy. Integumentary (Hair, Skin) Wound #21 status is Open. Original cause of wound was Gradually Appeared. The date acquired was: 04/08/2023. The wound is located on  the Left,Lateral Lower Leg. The wound measures 5cm length x 3.5cm width x 0.1cm depth; 13.744cm^2 area and 1.374cm^3 volume. There is Fat Layer (Subcutaneous Tissue) exposed. There is no tunneling or undermining noted. There is [name] medium amount of  serosanguineous drainage noted. There is small (1-33%) red granulation within the wound bed. There is [name] large (67-100%) amount of necrotic tissue within the wound bed including Adherent Slough. 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 There was [name] Double Layer Compression Therapy Procedure by Yevonne Pax, RN. Post procedure Diagnosis Wound #21: Same as Pre-Procedure Plan DARROLD, TRIPPLETT [name] ([id number]) [id number] Page 11 of 13 Follow-up Appointments: Return Appointment in 1 week. Bathing/ Shower/ Hygiene: May shower; gently cleanse wound with antibacterial soap, rinse and pat dry prior to dressing wounds Edema Control - Lymphedema / Segmental Compressive Device / Other: Patient to wear own compression stockings. Remove compression stockings every night before going to bed and put on every morning when getting up. - right Elevate, Exercise Daily and Avoid Standing for Long Periods of Time. Elevate legs to the level of the heart and pump ankles as often as possible Elevate leg(s) parallel to the floor when sitting. The following medication(s) was prescribed: levofloxacin oral 500 mg tablet 1 1 tablet oral once daily x 14 days starting 05/16/2023 WOUND #21: - Lower Leg Wound Laterality: Left, Lateral Cleanser: Soap and Water 2 x Per Week/30 Days Discharge Instructions: Gently cleanse wound with antibacterial soap, rinse and pat dry prior to dressing wounds Cleanser: Wound Cleanser 2 x Per Week/30 Days Discharge Instructions: Wash your  hands with soap and water. Remove old dressing, discard into plastic bag and place into trash. Cleanse the wound with Wound Cleanser prior to applying [name] clean dressing using gauze sponges, not tissues or cotton balls. Do not scrub or use excessive force. Pat dry using gauze sponges, not tissue or cotton balls. Topical: Triamcinolone Acetonide Cream, 0.1%, 15 (g) tube 2 x Per Week/30 Days Discharge Instructions: Apply as directed by provider. Prim Dressing: Hydrofera Blue Ready Transfer Foam, 2.5x2.5 (in/in) 2 x Per Week/30 Days ary Discharge Instructions: Apply Hydrofera Blue Ready to wound bed as directed Secondary Dressing: Zetuvit Plus 4x8 (in/in) 2 x Per Week/30 Days Com pression Wrap: Urgo K2, two layer compression system, regular 2 x Per Week/30 Days 1. I would recommend that we have the patient continue to monitor for any signs of infection or worsening. Based on what I am seeing I do believe that he would benefit from [name] continuation of therapy with the compression wrapping. 2. He admits to really not wearing his compression socks at home he tells me that they are "too damn hard to get on". Subsequently I discussed the possibility of Velcro wraps he states that that is something that he would be interested in if it was easier to get on he would definitely wear those. I think we may be able to look into that as [name] possibility and that we will see about getting that ordered in the next couple of weeks. 3. For now we can initiate treatment with [name] 4-layer equivalent compression wrap and we will subsequently see where things stand at follow-up. We  will see patient back for reevaluation in 1 week here in the clinic. If anything worsens or changes patient will contact our office for additional recommendations. Electronic Signature(s) Signed: 05/16/2023 4:55:33 PM By: Allen Derry PA-C Entered By: Allen Derry on 05/16/2023  16:55:33 -------------------------------------------------------------------------------- ROS/PFSH Details Patient Name: Date of Service: [name] [name], [name] 05/16/2023 9:30 [name] Medical Record Number: [id number] Patient Account Number: [id number] Date of Birth/Sex: Treating RN: [birth year] (76 y.o. [name]) Yevonne Pax Primary Care Provider: [name] Other Clinician: Referring Provider: Treating Provider/Extender: [name] in Treatment: 0 Information Obtained From Patient Cardiovascular Medical History: Positive for: Arrhythmia - [name]-fib; Hypertension Negative for: Angina Endocrine Medical History: Negative for: Type II Diabetes Past Medical History Notes: pt states never told he was [name] diabetic ARAGORN, MENDEZ [name] ([id number]) [id number] Page 12 of 13 Musculoskeletal Medical History: Positive for: Gout Immunizations Pneumococcal Vaccine: Received Pneumococcal Vaccination: Yes Received Pneumococcal Vaccination On or After 60th Birthday: Yes Implantable Devices None Family and Social History Cancer: Yes - Father; Diabetes: Yes - Paternal Grandparents; Heart Disease: Yes - Father; Hereditary Spherocytosis: No; Hypertension: Yes - Mother,Father,Siblings; Kidney Disease: No; Lung Disease: No; Seizures: No; Stroke: No; Thyroid Problems: No; Tuberculosis: No; Never smoker; Marital Status - Divorced; Alcohol Use: Rarely; Drug Use: No History; Caffeine Use: Never; Financial Concerns: No; Food, Clothing or Shelter Needs: No; Support System Lacking: No; Transportation Concerns: No Electronic Signature(s) Signed: 05/16/2023 5:05:18 PM By: Allen Derry PA-C Signed: 05/23/2023 8:04:04 AM By: Yevonne Pax RN Entered By: Yevonne Pax on 05/16/2023 09:41:19 -------------------------------------------------------------------------------- SuperBill Details Patient Name: Date of Service: [name] [name] 05/16/2023 Medical Record Number:  [id number] Patient Account Number: [id number] Date of Birth/Sex: Treating RN: [birth year] (76 y.o. [name]) Yevonne Pax Primary Care Provider: [name] Other Clinician: Referring Provider: Treating Provider/Extender: [name] in Treatment: 0 Diagnosis Coding ICD-10 Codes Code Description [phone] Chronic venous hypertension (idiopathic) with ulcer and inflammation of bilateral lower extremity L97.822 Non-pressure chronic ulcer of other part of left lower leg with fat layer exposed I73.89 Other specified peripheral vascular diseases I48.0 Paroxysmal atrial fibrillation I10 Essential (primary) hypertension Facility Procedures : CPT4 Code: 52841324 Description: 40102 - WOUND CARE VISIT-LEV 2 EST PT Modifier: Quantity: 1 Physician Procedures : CPT4 Code Description Modifier [id number] 99214 - WC PHYS LEVEL 4 - EST PT ICD-10 Diagnosis Description 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 L97.822 Non-pressure chronic ulcer of other  part of left lower leg with fat layer exposed I73.89 Other specified peripheral vascular diseases I48.0 Paroxysmal atrial fibrillation MEARLE, SCHWEDER [name] ([id number]) [id number] Page 1 Quantity: 1 3 of 13 Electronic Signature(s) Signed: 05/16/2023 5:04:19 PM By: Allen Derry PA-C Entered By: Allen Derry on 05/16/2023 17:04:18

## 2023-05-23 NOTE — Progress Notes (Signed)
LEMMIE, CRUS [name] ([id number]) [id number] Page 1 of 5 Visit Report for 05/16/2023 Abuse Risk Screen Details Patient Name: Date of Service: [name] [name] Corner [name] 05/16/2023 9:30 A M Medical Record Number: [id number] Patient Account Number: [id number] Date of Birth/Sex: Treating RN: 1946-07-01 (77 y.o. [name]) Yevonne Pax Primary Care : [name] Other Clinician: Referring : Treating /Extender: [name] in Treatment: 0 Abuse Risk Screen Items Answer ABUSE RISK SCREEN: Has anyone close to you tried to hurt or harm you recentlyo No Do you feel uncomfortable with anyone in your familyo No Has anyone forced you do things that you didnt want to doo No Electronic Signature(s) Signed: 05/23/2023 8:04:04 AM By: Yevonne Pax RN Entered By: Yevonne Pax on 05/16/2023 09:41:27 -------------------------------------------------------------------------------- Activities of Daily Living Details Patient Name: Date of Service: [name] [name] Corner [name] 05/16/2023 9:30 A M Medical Record Number: [id number] Patient Account Number: [id number] Date of Birth/Sex: Treating RN: 1946-05-24 (77 y.o. [name]) Yevonne Pax Primary Care : [name] Other Clinician: Referring : Treating /Extender: [name] in Treatment: 0 Activities of Daily Living Items Answer Activities of Daily Living (Please select one for each item) Drive Automobile Completely Able T Medications ake Completely Able Use T elephone Completely Able Care for Appearance Completely Able Use T oilet Completely Able Bath / Shower Completely Able Dress Self Completely Able Feed Self Completely Able Walk Completely Able Get In / Out Bed Completely Able Housework Completely HENDRYX, SNIDOW [name] ([id number]) [phone] [id number] Page 2 of 5 Prepare Meals Completely Able Handle Money Completely  Able Shop for Self Completely Able Electronic Signature(s) Signed: 05/23/2023 8:04:04 AM By: Yevonne Pax RN Entered By: Yevonne Pax on 05/16/2023 09:42:01 -------------------------------------------------------------------------------- Education Screening Details Patient Name: Date of Service: [name] [name] Edison, [name] 05/16/2023 9:30 A M Medical Record Number: [id number] Patient Account Number: [id number] Date of Birth/Sex: Treating RN: 1945/10/29 (77 y.o. [name]) Yevonne Pax Primary Care : [name] Other Clinician: Referring : Treating /Extender: [name] in Treatment: 0 Primary Learner Assessed: Patient Learning Preferences/Education Level/Primary Language Learning Preference: Explanation Highest Education Level: College or Above Preferred Language: English Cognitive Barrier Language Barrier: No Translator Needed: No Memory Deficit: No Emotional Barrier: No Cultural/Religious Beliefs Affecting Medical Care: No Physical Barrier Impaired Vision: No Impaired Hearing: No Decreased Hand dexterity: No Knowledge/Comprehension Knowledge Level: Medium Comprehension Level: High Ability to understand written instructions: High Ability to understand verbal instructions: High Motivation Anxiety Level: Anxious Cooperation: Cooperative Education Importance: Acknowledges Need Interest in Health Problems: Asks Questions Perception: Coherent Willingness to Engage in Self-Management High Activities: Readiness to Engage in Self-Management High Activities: Electronic Signature(s) Signed: 05/23/2023 8:04:04 AM By: Yevonne Pax RN Entered By: Yevonne Pax on 05/16/2023 09:42:26 KOLTYN, WOHLFARTH [name] ([id number]) [id number] Page 3 of 5 -------------------------------------------------------------------------------- Fall Risk Assessment Details Patient Name: Date of Service: [name] [name] Corner [name] 05/16/2023 9:30 A  M Medical Record Number: [id number] Patient Account Number: [id number] Date of Birth/Sex: Treating RN: 1946/06/18 (77 y.o. [name]) Yevonne Pax Primary Care : [name] Other Clinician: Referring : Treating /Extender: [name] in Treatment: 0 Fall Risk Assessment Items Have you had 2 or more falls in the last 12 monthso 0 No Have you had any fall that resulted in injury in the last 12 monthso 0 No FALLS RISK SCREEN History of falling - immediate or within 3 months 0 No Secondary diagnosis (Do you have 2 or more  medical diagnoseso) 0 No Ambulatory aid None/bed rest/wheelchair/nurse 0 Yes Crutches/cane/walker 0 No Furniture 0 No Intravenous therapy Access/Saline/Heparin Lock 0 No Gait/Transferring Normal/ bed rest/ wheelchair 0 Yes Weak (short steps with or without shuffle, stooped but able to lift head while walking, may seek 0 No support from furniture) Impaired (short steps with shuffle, may have difficulty arising from chair, head down, impaired 0 No balance) Mental Status Oriented to own ability 0 Yes Electronic Signature(s) Signed: 05/23/2023 8:04:04 AM By: Yevonne Pax RN Entered By: Yevonne Pax on 05/16/2023 09:42:35 -------------------------------------------------------------------------------- Foot Assessment Details Patient Name: Date of Service: [name] [name] Corner [name] 05/16/2023 9:30 A M Medical Record Number: [id number] Patient Account Number: [id number] Date of Birth/Sex: Treating RN: Feb 16, 1946 (77 y.o. [name]) Yevonne Pax Primary Care : [name] Other Clinician: Referring : Treating /Extender: [name] in Treatment: 0 Foot Assessment Items Site Locations [place], Nectar [name] ([id number]) [phone] [id number] Page 4 of 5 + = Sensation present, - = Sensation absent, C = Callus, U = Ulcer [name] = Redness, W = Warmth, M = Maceration, PU = Pre-ulcerative  lesion F = Fissure, S = Swelling, D = Dryness Assessment Right: Left: Other Deformity: No No Prior Foot Ulcer: No No Prior Amputation: No No Charcot Joint: No No Ambulatory Status: Ambulatory Without Help Gait: Steady Electronic Signature(s) Signed: 05/23/2023 8:04:04 AM By: Yevonne Pax RN Entered By: Yevonne Pax on 05/16/2023 09:47:41 -------------------------------------------------------------------------------- Nutrition Risk Screening Details Patient Name: Date of Service: [name] Feliz Beam [name] 05/16/2023 9:30 A M Medical Record Number: [id number] Patient Account Number: [id number] Date of Birth/Sex: Treating RN: [birth year] (77 y.o. [name]) Yevonne Pax Primary Care : [name] Other Clinician: Referring : Treating /Extender: [name] in Treatment: 0 Height (in): 75 Weight (lbs): 220 Body Mass Index (BMI): 27.5 Nutrition Risk Screening Items Score Screening NUTRITION RISK SCREEN: I have an illness or condition that made me change the kind and/or amount of food I eat 0 No I eat fewer than two meals per day 0 No I eat few fruits and vegetables, or milk products 0 No I have three or more drinks of beer, liquor or wine almost every day 0 No I have tooth or mouth problems that make it hard for me to eat 0 No I don't always have enough money to buy the food I need 0 No GEOVANNI, ORLANDI [name] ([id number]) [phone] [id number] Page 5 of 5 I eat alone most of the time 0 No I take three or more different prescribed or over-the-counter drugs a day 1 Yes Without wanting to, I have lost or gained 10 pounds in the last six months 0 No I am not always physically able to shop, cook and/or feed myself 0 No Nutrition Protocols Good Risk Protocol 0 No interventions needed Moderate Risk Protocol High Risk Proctocol Risk Level: Good Risk Score: 1 Electronic Signature(s) Signed: 05/23/2023 8:04:04 AM By: Yevonne Pax  RN Entered By: Yevonne Pax on 05/16/2023 09:42:44

## 2023-05-24 ENCOUNTER — Encounter: Payer: Medicare PPO | Admitting: Physician Assistant

## 2023-05-24 DIAGNOSIS — I872 Venous insufficiency (chronic) (peripheral): Secondary | ICD-10-CM | POA: Diagnosis not present

## 2023-05-24 DIAGNOSIS — I428 Other cardiomyopathies: Secondary | ICD-10-CM | POA: Diagnosis not present

## 2023-05-24 DIAGNOSIS — L97822 Non-pressure chronic ulcer of other part of left lower leg with fat layer exposed: Secondary | ICD-10-CM | POA: Diagnosis not present

## 2023-05-24 DIAGNOSIS — Z85828 Personal history of other malignant neoplasm of skin: Secondary | ICD-10-CM | POA: Diagnosis not present

## 2023-05-24 DIAGNOSIS — I739 Peripheral vascular disease, unspecified: Secondary | ICD-10-CM | POA: Diagnosis not present

## 2023-05-24 DIAGNOSIS — Z7901 Long term (current) use of anticoagulants: Secondary | ICD-10-CM | POA: Diagnosis not present

## 2023-05-24 DIAGNOSIS — I10 Essential (primary) hypertension: Secondary | ICD-10-CM | POA: Diagnosis not present

## 2023-05-24 DIAGNOSIS — I87333 Chronic venous hypertension (idiopathic) with ulcer and inflammation of bilateral lower extremity: Secondary | ICD-10-CM | POA: Diagnosis not present

## 2023-05-24 DIAGNOSIS — L958 Other vasculitis limited to the skin: Secondary | ICD-10-CM | POA: Diagnosis not present

## 2023-05-24 DIAGNOSIS — I480 Paroxysmal atrial fibrillation: Secondary | ICD-10-CM | POA: Diagnosis not present

## 2023-05-24 NOTE — Progress Notes (Addendum)
ELDRA, LENTON R ([id number]) [id number] Page 1 of 11 Visit Report for 05/24/2023 Chief Complaint Document Details Patient Name: Date of Service: [name] 05/24/2023 12:00 PM Medical Record Number: [id number] Patient Account Number: [id number] Date of Birth/Sex: Treating RN: April 05, 1946 (77 y.o. [name]) Yevonne Pax Primary Care Provider: [name] Other Clinician: Referring Provider: Treating Provider/Extender: [name] in Treatment: 1 Information Obtained from: Patient Chief Complaint Left LE Ulcer Electronic Signature(s) Signed: 05/24/2023 12:18:19 PM By: Allen Derry PA-C Entered By: Allen Derry on 05/24/2023 09:18:19 -------------------------------------------------------------------------------- HPI Details Patient Name: Date of Service: [name], [name] 05/24/2023 12:00 PM Medical Record Number: [id number] Patient Account Number: [id number] Date of Birth/Sex: Treating RN: Feb 17, 1946 (77 y.o. 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 the patient has had a vascular workup over 7 years ago and said everything was normal at that stage. He does not have any chronic problems except for cardiac issues which he sees a cardiologist in [place]. 08/15/2017 -- arterial and venous duplex studies still pending. 08/23/2017 -- venous reflux studies done on 08/13/2017 shows  venous incompetence throughout the left lower extremity deep system and focally at the left saphenofemoral junction. 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 No evidence of SVT or DVT in bilateral lower extremities The patient has an appointment at the end of the month to get his arterial duplex study done 09/05/2017 -- the patient was seen at the vein and vascular office yesterday by Bary Castilla. ABI studies were notable for medial calcification and the toe brachial indices were normal and bilateral ankle-brachial) waveforms were normal with triphasic flow. After review of his venous studies he was not a candidate for laser ablation and his lymphedema was to be treated with compression stockings and lymphedema pump pumps 09/12/2017 -- had a low arterial study done at the [facility] -- unable to obtain reliable ABI is due to medial calcification. Bilateral toe ISHAN, GIAMMANCO R ([id number]) [id number] Page 2 of 11 indices were normal with the right being 1.01 and the left being 0.92 and the waveforms were triphasic bilaterally. he did get hold of 30-40 mm compression stockings but is unable to put these on. We will try and get him alternative compression stockings. 09/26/17- he is here in follow up evaluation of a right lower extremity ulcer;he is compliant in wearing compression stocking; ulcer almost epithelialized , anticipate healing next appointment Readmission: 11/17 point upon evaluation patient's wound currently that he is seeing [place] for today is a skin cancerous lesion that was cleared away by his dermatologist on the left medial calf region. He tells me that this is a very similar thing to what he had done previously in fact the last time he saw him in 2018 this was also what was going on at that point. Nonetheless he feels that based on what he seeing currently that this is just having a lot of harder time healing  although it is much closer to the surface than what he is experienced in the past. He notes  that the initial removal was in June 2022 which was this year this is now November and still has not closed. He does have some edema and definitely I think that there is some venous component to his slow healing here. Also think that we can do something better than Vaseline to try to help with getting this to clear up as quickly as possible. He does have a history of atrial fibrillation and is on Eliquis otherwise he really has no major medical problems that would affect wound healing. 09/07/2021 upon evaluation today patient actually appears to be doing significantly better after having wrapped him last week. Overall I think that this is making significant improvements at this time which is great news. I do not see any evidence of infection which is great news as well. No fevers, chills, nausea, vomiting, or diarrhea. 09/14/2021 upon evaluation today patient appears to be doing well with regard to his leg ulcer. He has been tolerating the dressing changes and overall I think that he is making excellent progress. I do not see any signs of active infection at this time. 09/21/2021 upon evaluation today patient actually appears to be making good progress with regard to his wound this is again measuring smaller today no debridement seems to be necessary. We have been using a silver collagen dressing and I think that is doing an awesome job. 09/28/2021 upon evaluation today patient appears to be doing well with regard to his leg currently. I do not see any signs of active infection at this time which is great news. No fevers, chills, nausea, vomiting, or diarrhea. I think this wound is very close to complete resolution. 10/12/2021 upon evaluation today patient actually appears to be doing awesome in regard to his leg ulcer. In fact this appears to be completely healed based on what I am seeing currently. I do not see  any evidence of active infection locally nor systemically at this time which is also great news. No fevers, chills, nausea, vomiting, or diarrhea. Readmission: 12/07/2021 upon evaluation today patient presents for readmission here in the clinic. He was discharged on 10/12/2021 is completely healed. Unfortunately this has reopened at this point and he is having continual issues with new blisters over both lower extremities. This is even worse than what we previously saw. Nonetheless we did actually check his ABIs today and it did reveal that his ABIs were 0.55 on the left and 0.57 on the right. Subsequently this is a definite change from his last arterial study which showed that he did have good blood flow at 1.01 on the right and 0.92 on the left and that was right at the beginning of 2019. Nonetheless based on what we see currently I do think he tolerated the 3 layer compression wrap but I do believe that we probably need to get him tested for his arterial flow in order to see where things stand and if there is something we can do there that would help prevent this from continue to be an ongoing issue. He did not utilize compression socks in the interim from when he was last here till this time. That something is probably going to need lifelong going forward as well. 3/9; patient presents for follow-up. He has no issues or complaints today. He tolerated the compression wrap well. He had ABIs with TBI's done. He denies signs of infection. 12/21/2021 upon evaluation today patient appears to be doing well with regard to the wounds on his legs. Both are showing  signs of significant improvement which is great news although I do believe some sharp debridement would be of benefit here as well. 12/28/2021 upon evaluation today patient appears to be doing well with regard to his wounds. Everything is showing signs of excellent improvement which I am very pleased about. I think that we are headed in the right  direction here. Fortunately there does not appear to be any evidence of infection which is great news there is a little bit of hypergranulation. 01/04/2022 upon evaluation today patient appears to be doing well with regard to his wounds 2 of them are healed 1 is almost so and the other 1 is significantly better. Overall I am extremely pleased with where we stand and I think that he is making excellent progress here. I do not see any evidence of active infection locally nor systemically at this time. 01-16-2022 upon evaluation today patient's wound on the left leg is showing signs of doing quite well. Has not completely cleared at this point but it is much improved. Fortunately I do not see any signs of infection at this time. No fevers, chills, nausea, vomiting, or diarrhea. 01-23-2022 upon evaluation today patient's wound of the left leg actually appears to be pretty much completely healed which is great news. I do not see any signs of active infection locally or systemically which is excellent. With that being said on the right leg what wound is measuring smaller the other 1 is a new wound that just showed up fortunately its not too bad. Has been using Xeroform here and that seems to be doing decently well which is great news. Unfortunately his blood pressure is significantly high we gave him the readings for the past 4-5 visits as well as a recommendation to make an appointment to go discuss this with his primary care provider patient states that he is going to look into doing this. 01-30-2022 upon evaluation today patient appears to be doing well with regard to his left leg everything appears to be healed. On the right leg the more anterior wound is healed the more medial wound that I been concerned about a possible skin cancer unfortunately still does not look great to me. I do believe that we should probably do a biopsy I have talked about it with him a few times I think though it is probably time to  go ahead and do this at this point. 02-09-2022 upon evaluation today patient appears to be doing well with regard to his legs. On the left this appears to be completely healed. On the right he does have 2 areas and be perfectly honest one of them is a skin cancer that he is going to the Mohs surgery clinic for the other seems to be healing nicely. Readmission: 08-02-2022 upon evaluation today patient appears for reevaluation here in our clinic concerning issues that he has been having with wounds over the bilateral lower extremities. I last saw him in May 2023 and at that point we had him completely healed. Unfortunately he is tells me this has broken down to some degree since that point. 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 He tells me this 1 seems to just be getting bigger and bigger not improving. Fortunately he is not having any significant  pain which is good news he does have quite a bit of swelling and he tells me that his fluid pills are not recommended for him to take daily but just in 3-day intervals here and there. 08-09-2022 upon evaluation today patient appears to be doing still somewhat poorly in regard to his legs although in general he does not appear to be feeling as good as he has been. Fortunately there does not appear to be any signs of infection which is good news. With that being said he is having some issues here with having and overall poor feeling in general which again is good I think going to be the biggest complicating factor. He actually seems to be coughing I do not hear any wheezing right now I did listen to his chest he did not have good airflow down low however makes me suspicious for bronchitis or even possibly pneumonia which could be part of what is going on here as  well. Fortunately I do not see any evidence of anything worsening in regard to his legs but I definitely believe that he needs to continue with the compression wraps he took them off yesterday to shower has not had anything on for 24 hours that is why his legs are so swollen today. With regard to his pathology report I did review that it showed some squamous abnormality but no signs of distinct carcinoma. With that being said it was JOSEANDRES, EMBURY ([id number]) [id number] Page 3 of 11 saying that it could be adjacent to a squamous cell carcinoma nonetheless my suggestion is can be that we have the patient take copy of this report and give it to his Mohs surgeon in order for them to see if there is anything they feel like needs to be done further. With that being said right now I feel like the primary thing is going to be for [place] to try to get his swelling down and keep that down into that hand since he is having so much drainage I believe we can have to bring him in for dressing changes twice a week doing a nurse visit on Mondays. 11/9; since the patient was last here he spent the night in the emergency room he received IV Lasix. Also received antibiotics although he was not discharged on either 1 of these. He also saw his cardiology office who put him on regular Lasix 20 mg [previously on as needed Lasix 20 mg]. Per our intake nurse the swelling in his legs is remarkably better but he still has bilateral lower extremity wounds. He still has wounds on the bilateral lower extremities most problematically on the left lateral calf. He has been using silver alginate under 3 layer compression. 08-23-2022 upon evaluation today patient appears to be doing much better than the last time I saw him 2 weeks ago. At that point I was very concerned about how he was doing he did see Dr. Sherrie Mustache his primary care provider they got him on some blood pressure medication in general his color  and overall appearance looks to be doing much improved compared to the last time I saw him. 09-04-2022 upon evaluation today patient appears to be doing well currently in regard to his wounds. Everything is showing signs of improvement which is great news. Fortunately there does not appear to be any signs of active infection locally or systemically at this time. No fevers, chills, nausea, vomiting, or diarrhea. 09-10-2022 upon evaluation today patient appears to be doing better in  regard to his wounds although the [facility] was extremely stuck to the wound bed. Fortunately there does not appear to be any signs of infection locally or systemically at this time which is great news. No fevers, chills, nausea, vomiting, or diarrhea. 09-17-2022 upon evaluation today patient appears to be doing well currently in regard to his wounds in general. The right leg actually showing signs of excellent improvement and very pleased with where things stand in that regard. Fortunately I do not see any evidence of infection locally or systemically at this time which is great news. No fevers, chills, nausea, vomiting, or diarrhea. 09-24-2022 upon evaluation today patient appears to be doing well currently in regard to his wounds. Things look to be doing quite well. With that being said he did have a result unfortunately on the pathology which showed that he did have a squamous cell carcinoma noted on the biopsy sample I sent last week. He is seeing his dermatologist tomorrow in that regard. With that being said other than that however he seems to really be making some pretty good progress here which is good news. No fevers, chills, nausea, vomiting, or diarrhea. 12/26; the patient has 2 open wounds remaining on the left leg. One is on the left anterior mid tibia and the other is on the right lateral knee just outside of the popliteal fossa. The latter wound apparently has been biopsied showing squamous cell  carcinoma. The patient has been to see dermatology Dr. Adolphus Birchwood who apparently is making him a referral to the [facility] Mohs surgery center. He does not yet have an appointment 10-12-2022 upon evaluation today patient appears to be doing well currently in regard to his wound. He has been tolerating the dressing changes without complication and overall feel like we are headed in the right direction. Fortunately I do not see any signs of infection locally or systemically at this time which is great news. No fevers, chills, nausea, vomiting, or diarrhea. 10-23-2022 upon evaluation today patient appears to be doing well currently in regard to his wound. He has been tolerating the dressing changes without complication. Fortunately there does not appear to be any signs of active infection locally nor systemically which is great news and overall I am extremely pleased with where we stand currently. No fevers, chills, nausea, vomiting, or diarrhea. 10-26-2022 upon evaluation today patient appears to be doing well currently in regard to his wounds. Everything is showing signs of improvement and this is great news. Fortunately I see no evidence of active infection systemically. He does seem to be doing much better in regard to the local infection in regard to his leg. The smell is also greatly improved. Overall I am extremely happy with where we stand today. This is after just a few days with the antibiotic on board. 11-05-2022 upon evaluation today patient appears to be doing well currently in regard to his wounds although the wound where they performed the Mohs surgery does look a little bit hyper granulated I think switching to [facility] may be better for him. He voiced understanding. Fortunately there does not appear to be any evidence of active infection locally nor systemically at this time. 11-12-2022 upon evaluation today patient appears to be doing better in regard to both wounds he has been tolerating  the dressing changes without complication. There is no signs of infection and in general I think you are doing quite well. No fevers, chills, nausea, vomiting, or diarrhea. 11-20-2022 upon evaluation today  patient appears to be doing well currently in regard to his wounds. He has been tolerating the dressing changes without complication. Fortunately there does not appear to be any signs of infection at this time. No fevers, chills, nausea, vomiting, or diarrhea. 11-27-2022 upon evaluation today patient appears to be doing somewhat poorly in regard to his leg in general he has a lot of areas where he looks like he had some spots that popped up. There with regard to new possible blisters. In general I am actually very concerned about the fact that the wrap may be causing some irritation here. I think that we can try to not do the wrap for 1 week, and given the prescription for mupirocin ointment which I will send into the pharmacy for him. 12-04-2022 upon evaluation today patient appears to be doing well currently in regard to his wounds in fact he appears to be pretty much completely healed based on what I am seeing at this point. I do not see any signs of active infection locally nor systemically at this time which is great news. No fevers, chills, nausea, vomiting, or diarrhea. 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 I discussed that with him today he is aware of the situation.-2024 upon evaluation patient presents for readmission here in the clinic actually last saw him December 04, 2022. With that being said previously his dermatologist had asked that if there was something the need to be addressed from a dermatology standpoint and specifically biopsy that we make referral back to them to allow them to do it which I am definitely happy to  do. 01-31-2023 upon evaluation today patient appears to be doing well currently in regard to his wound in fact this looks better he did see Dr. Adolphus Birchwood his dermatologist yesterday and they did perform a biopsy. With that being said he actually looks like he is doing much better Dr. Adolphus Birchwood feels like this may not be a cancerous lesion which will be very good news at the same time I definitely wanted to make sure especially considering his history and the way this looks when we saw him last week. Nonetheless I am extremely pleased with the fact that he is doing so much better at this point. 02-07-2023 upon evaluation today patient appears to be doing well currently in regard to his wound from the standpoint of size it has not gotten any larger. With that being said he does have some issues here still with what appears to be potentially some infection. Wound does not look quite as good as it did last week. We are still waiting on the results for the pathology. I did put a call into dermatology but I have not heard anything back from them as of yet. 02-14-2023 upon evaluation today patient appears to be doing well with regard to his wound infection is definitely under control and looks much better. Fortunately I do not see any signs of systemic infection with anyone locally I think this is improved greatly. 02-19-2023 upon evaluation today patient appears to be doing well currently in regard to his wound which is actually measuring smaller and looking much better. Fortunately I do not see any evidence of active infection locally nor systemically which is great news and overall I am extremely pleased with where we stand today. 02-26-2023 upon evaluation  today patient appears to be doing well currently in regard to his wound. He is actually been tolerating the dressing changes without complication. Fortunately there does not appear to be any signs of active infection locally nor systemically which is great news and  overall I am extremely URIJAH, BITTING R ([id number]) [id number] Page 4 of 11 pleased with where we stand currently. 03-12-2023 upon evaluation today patient's wound actually showed signs of excellent improvement. I am very pleased with where we stand I do believe that we are making good progress here. I do not see any signs of active infection. 03-19-2023 upon evaluation today patient actually appears to be making excellent progress in regard to his leg and getting this closed. In fact is just a very small area that is actually open at this point. I am actually very pleased with what we are seeing today. 03-26-2023 upon evaluation today patient appears to be doing well currently in regard to his leg which is actually showing signs of being completely healed. Fortunately I do not see any signs of active infection locally or systemically which is great news and in general I do believe that we are moving in the right direction here. Readmission: 05-16-2023 upon evaluation today patient presents for reevaluation here in the clinic concerning a wound on the left anterior/lateral lower extremity. This is similar to where the wound was last time I saw him but not exactly the same. Fortunately there does not appear to be any signs of active infection at this time which is great news. The patient's past medical history really has not changed significantly since last time I saw him. 05-24-2023 upon evaluation patient actually appears to be doing excellent in regard to his leg ulcer. He has been tolerating the dressing changes without complication in general I do feel like there were making excellent headway towards complete closure. I do not see any signs of active infection at this point. Electronic Signature(s) Signed: 05/24/2023 2:04:10 PM By: Allen Derry PA-C Entered By: Allen Derry on 05/24/2023  11:04:10 -------------------------------------------------------------------------------- Physical Exam Details Patient Name: Date of Service: [name], [name] 05/24/2023 12:00 PM Medical Record Number: [id number] Patient Account Number: [id number] Date of Birth/Sex: Treating RN: 06-17-1946 (77 y.o. [name] Primary Care Provider: [name] Other Clinician: Referring Provider: Treating Provider/Extender: [name] in Treatment: 1 Constitutional Well-nourished and well-hydrated in no acute distress. Respiratory normal breathing without difficulty. Psychiatric this patient is able to make decisions and demonstrates good insight into disease process. Alert and Oriented x 3. pleasant and cooperative. Notes Patient's wound showed excellent granulation with really little to no hypergranulation at this point. He is tolerating the Hydrofera Blue without complication and seems to be doing quite well. Electronic Signature(s) Signed: 05/24/2023 2:04:23 PM By: Allen Derry PA-C Entered By: Allen Derry on 05/24/2023 11:04:23 Shawnee Knapp R ([id number]) [id number] Page 5 of 11 -------------------------------------------------------------------------------- Physician Orders Details Patient Name: Date of Service: [name] 05/24/2023 12:00 PM Medical Record Number: [id number] Patient Account Number: [id number] Date of Birth/Sex: Treating RN: August 01, 1946 (77 y.o. 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 Bathing/ Shower/ Hygiene May shower; gently cleanse wound with antibacterial soap, rinse and pat dry prior to dressing wounds Edema Control - Lymphedema / Segmental Compressive Device / Other Patient to wear own compression stockings. Remove compression stockings every night before going to bed and put on every morning when getting up. - right Elevate, Exercise Daily and A void Standing for Long Periods of Time. Elevate legs to the level of the heart and pump ankles as often as possible Elevate leg(s) parallel to the floor when sitting. Wound Treatment Wound #21 - Lower Leg Wound Laterality: Left, Lateral Cleanser: Soap and Water 2 x Per Week/30 Days Discharge Instructions: Gently cleanse wound with antibacterial soap, rinse and pat dry prior to dressing wounds Cleanser: Wound Cleanser 2 x Per Week/30 Days Discharge Instructions: Wash your hands with soap and water. Remove old dressing, discard into plastic bag and place into trash. Cleanse the wound with Wound Cleanser prior to applying a clean dressing using gauze sponges, not tissues or cotton balls. Do not scrub or use excessive force. Pat dry using gauze sponges, not tissue or cotton balls. Peri-Wound Care: AandD Ointment 2 x Per Week/30 Days Discharge Instructions: Apply AandD Ointment as directed Prim Dressing: Hydrofera Blue Ready Transfer Foam, 2.5x2.5 (in/in) 2 x Per Week/30 Days ary Discharge Instructions: Apply Hydrofera Blue Ready to wound bed as directed Secondary Dressing: Zetuvit Plus 4x8 (in/in) 2 x Per Week/30 Days Compression Wrap: Urgo K2, two layer compression system, regular 2 x Per Week/30 Days Electronic Signature(s) Signed: 05/24/2023 3:00:55 PM By: Allen Derry PA-C Signed: 06/07/2023 11:59:03 AM By: Yevonne Pax  RN Entered By: Yevonne Pax on 05/24/2023 09:33:51 Shawnee Knapp R ([id number]) [id number] Page 6 of 11 -------------------------------------------------------------------------------- Problem List Details Patient Name: Date of Service: [name] 05/24/2023 12:00 PM Medical Record Number: [id number] Patient Account Number: [id number] Date of Birth/Sex: Treating RN: 01/22/1946 (77 y.o. [name]) Yevonne Pax Primary Care Provider: [name] Other Clinician: Referring Provider: Treating Provider/Extender: [name] in Treatment: 1 Active Problems ICD-10 Encounter Code Description Active Date MDM Diagnosis I89.0 Lymphedema, not elsewhere classified 05/16/2023 No Yes I87.333 Chronic venous hypertension (idiopathic) with ulcer and inflammation of 05/16/2023 No Yes bilateral lower extremity L97.822 Non-pressure chronic ulcer of other part of left lower leg with fat layer exposed8/05/2023 No Yes I73.89 Other specified peripheral vascular diseases 05/16/2023 No Yes I48.0 Paroxysmal atrial fibrillation 05/16/2023 No Yes I10 Essential (primary) hypertension 05/16/2023 No Yes Inactive Problems Resolved Problems Electronic Signature(s) Signed: 05/24/2023 12:18:14 PM By: Allen Derry PA-C Entered By: Allen Derry on 05/24/2023 09:18:14 Shawnee Knapp R ([id number]) [id number] Page 7 of 11 -------------------------------------------------------------------------------- Progress Note Details Patient Name: Date of Service: MO Feliz Beam LBERT R. 05/24/2023 12:00 PM Medical Record Number: [id number] Patient Account Number: [id number] Date of Birth/Sex: Treating RN: 1945-10-15 (77 y.o. 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 the patient has had a vascular workup over  7 years ago and said everything was normal at that stage. He does not have any chronic problems except for cardiac issues which he sees a cardiologist in [place]. 08/15/2017 -- arterial and venous duplex studies still pending. 08/23/2017 -- venous reflux studies done on 08/13/2017 shows venous incompetence throughout the left lower extremity deep system and focally at the left saphenofemoral junction. 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 No evidence of SVT or DVT in bilateral lower extremities The patient has an appointment at the end of the month to get his arterial duplex study done 09/05/2017 -- the patient was seen at the vein and vascular office yesterday by Bary Castilla. ABI studies were notable for medial calcification and the toe brachial indices were normal and bilateral ankle-brachial) waveforms were normal with triphasic flow. After review of his venous studies he was not a candidate for laser ablation and his lymphedema was to be treated with compression stockings and lymphedema pump pumps 09/12/2017 -- had a low arterial study done at the [facility] -- unable to obtain reliable ABI is due to medial calcification. Bilateral toe indices were normal with the right being 1.01 and the left being 0.92 and the waveforms were triphasic bilaterally. he did get hold of 30-40 mm compression stockings but is unable to put these on. We will try and get him alternative compression  stockings. 09/26/17- he is here in follow up evaluation of a right lower extremity ulcer;he is compliant in wearing compression stocking; ulcer almost epithelialized , anticipate healing next appointment Readmission: 11/17 point upon evaluation patient's wound currently that he is seeing [place] for today is a skin cancerous lesion that was cleared away by his dermatologist on the left medial calf region. He tells me that this is a very similar thing to what he had done previously in fact the last time he saw him in 2018 this was also what was going on at that point. Nonetheless he feels that based on what he seeing currently that this is just having a lot of harder time healing although it is much closer to the surface than what he is experienced in the past. He notes that the initial removal was in June 2022 which was this year this is now November and still has not closed. He does have some edema and definitely I think that there is some venous component to his slow healing here. Also think that we can do something better than Vaseline to try to help with getting this to clear up as quickly as possible. He does have a history of atrial fibrillation and is on Eliquis otherwise he really has no major medical problems that would affect wound healing. 09/07/2021 upon evaluation today patient actually appears to be doing significantly better after having wrapped him last week. Overall I think that this is making significant improvements at this time which is great news. I do not see any evidence of infection which is great news as well. No fevers, chills, nausea, vomiting, or diarrhea. 09/14/2021 upon evaluation today patient appears to be doing well with regard to his leg ulcer. He has been tolerating the dressing changes and overall I think that he is making excellent progress. I do not see any signs of active infection at this time. 09/21/2021 upon evaluation today patient actually appears to be making  good progress with regard to his wound this is again measuring smaller today no debridement seems to be necessary. We have been  using a silver collagen dressing and I think that is doing an awesome job. 09/28/2021 upon evaluation today patient appears to be doing well with regard to his leg currently. I do not see any signs of active infection at this time which is great news. No fevers, chills, nausea, vomiting, or diarrhea. I think this wound is very close to complete resolution. 10/12/2021 upon evaluation today patient actually appears to be doing awesome in regard to his leg ulcer. In fact this appears to be completely healed based on what I am seeing currently. I do not see any evidence of active infection locally nor systemically at this time which is also great news. No fevers, chills, nausea, vomiting, or diarrhea. Readmission: 12/07/2021 upon evaluation today patient presents for readmission here in the clinic. He was discharged on 10/12/2021 is completely healed. Unfortunately this has reopened at this point and he is having continual issues with new blisters over both lower extremities. This is even worse than what we previously saw. Nonetheless we did actually check his ABIs today and it did reveal that his ABIs were 0.55 on the left and 0.57 on the right. Subsequently this is a definite change from his last arterial study which showed that he did have good blood flow at 1.01 on the right and 0.92 on the left and that was right at the beginning of 2019. Nonetheless based on what we see currently I do think he tolerated the 3 layer compression wrap but I do believe that we probably need to get him tested for his arterial flow in order to see where things stand and if there is something we can do there that would help prevent this from continue to be an ongoing issue. He did not utilize compression socks in the interim from when he was last here till this time. That something is probably going to  need lifelong going forward as well. 3/9; patient presents for follow-up. He has no issues or complaints today. He tolerated the compression wrap well. He had ABIs with TBI's done. He denies signs of infection. 12/21/2021 upon evaluation today patient appears to be doing well with regard to the wounds on his legs. Both are showing signs of significant improvement which is great news although I do believe some sharp debridement would be of benefit here as well. 12/28/2021 upon evaluation today patient appears to be doing well with regard to his wounds. Everything is showing signs of excellent improvement which I am very pleased about. I think that we are headed in the right direction here. Fortunately there does not appear to be any evidence of infection which is great news there is a little bit of hypergranulation. 01/04/2022 upon evaluation today patient appears to be doing well with regard to his wounds 2 of them are healed 1 is almost so and the other 1 is significantly UNK, LYBECK R ([id number]) [id number] Page 8 of 11 better. Overall I am extremely pleased with where we stand and I think that he is making excellent progress here. I do not see any evidence of active infection locally nor systemically at this time. 01-16-2022 upon evaluation today patient's wound on the left leg is showing signs of doing quite well. Has not completely cleared at this point but it is much improved. Fortunately I do not see any signs of infection at this time. No fevers, chills, nausea, vomiting, or diarrhea. 01-23-2022 upon evaluation today patient's wound of the left leg actually appears to be pretty much completely  healed which is great news. I do not see any signs of active infection locally or systemically which is excellent. With that being said on the right leg what wound is measuring smaller the other 1 is a new wound that just showed up fortunately its not too bad. Has been using  Xeroform here and that seems to be doing decently well which is great news. Unfortunately his blood pressure is significantly high we gave him the readings for the past 4-5 visits as well as a recommendation to make an appointment to go discuss this with his primary care provider patient states that he is going to look into doing this. 01-30-2022 upon evaluation today patient appears to be doing well with regard to his left leg everything appears to be healed. On the right leg the more anterior wound is healed the more medial wound that I been concerned about a possible skin cancer unfortunately still does not look great to me. I do believe that we should probably do a biopsy I have talked about it with him a few times I think though it is probably time to go ahead and do this at this point. 02-09-2022 upon evaluation today patient appears to be doing well with regard to his legs. On the left this appears to be completely healed. On the right he does have 2 areas and be perfectly honest one of them is a skin cancer that he is going to the Mohs surgery clinic for the other seems to be healing nicely. Readmission: 08-02-2022 upon evaluation today patient appears for reevaluation here in our clinic concerning issues that he has been having with wounds over the bilateral lower extremities. I last saw him in May 2023 and at that point we had him completely healed. Unfortunately he is tells me this has broken down to some degree since that point. 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 He tells me this 1 seems to just be getting bigger and bigger not improving. Fortunately he is not having any significant pain which is good news he does have quite a bit of swelling and he tells me that his fluid pills are not  recommended for him to take daily but just in 3-day intervals here and there. 08-09-2022 upon evaluation today patient appears to be doing still somewhat poorly in regard to his legs although in general he does not appear to be feeling as good as he has been. Fortunately there does not appear to be any signs of infection which is good news. With that being said he is having some issues here with having and overall poor feeling in general which again is good I think going to be the biggest complicating factor. He actually seems to be coughing I do not hear any wheezing right now I did listen to his chest he did not have good airflow down low however makes me suspicious for bronchitis or even possibly pneumonia which could be part of what is going on here as well. Fortunately I do not see any evidence of anything worsening in regard to his legs but I definitely believe that he needs to continue with the compression wraps he took them off yesterday to shower has not had anything on for 24 hours that is why his  legs are so swollen today. With regard to his pathology report I did review that it showed some squamous abnormality but no signs of distinct carcinoma. With that being said it was saying that it could be adjacent to a squamous cell carcinoma nonetheless my suggestion is can be that we have the patient take copy of this report and give it to his Mohs surgeon in order for them to see if there is anything they feel like needs to be done further. With that being said right now I feel like the primary thing is going to be for [place] to try to get his swelling down and keep that down into that hand since he is having so much drainage I believe we can have to bring him in for dressing changes twice a week doing a nurse visit on Mondays. 11/9; since the patient was last here he spent the night in the emergency room he received IV Lasix. Also received antibiotics although he was not discharged on either 1 of  these. He also saw his cardiology office who put him on regular Lasix 20 mg [previously on as needed Lasix 20 mg]. Per our intake nurse the swelling in his legs is remarkably better but he still has bilateral lower extremity wounds. He still has wounds on the bilateral lower extremities most problematically on the left lateral calf. He has been using silver alginate under 3 layer compression. 08-23-2022 upon evaluation today patient appears to be doing much better than the last time I saw him 2 weeks ago. At that point I was very concerned about how he was doing he did see Dr. Sherrie Mustache his primary care provider they got him on some blood pressure medication in general his color and overall appearance looks to be doing much improved compared to the last time I saw him. 09-04-2022 upon evaluation today patient appears to be doing well currently in regard to his wounds. Everything is showing signs of improvement which is great news. Fortunately there does not appear to be any signs of active infection locally or systemically at this time. No fevers, chills, nausea, vomiting, or diarrhea. 09-10-2022 upon evaluation today patient appears to be doing better in regard to his wounds although the [facility] was extremely stuck to the wound bed. Fortunately there does not appear to be any signs of infection locally or systemically at this time which is great news. No fevers, chills, nausea, vomiting, or diarrhea. 09-17-2022 upon evaluation today patient appears to be doing well currently in regard to his wounds in general. The right leg actually showing signs of excellent improvement and very pleased with where things stand in that regard. Fortunately I do not see any evidence of infection locally or systemically at this time which is great news. No fevers, chills, nausea, vomiting, or diarrhea. 09-24-2022 upon evaluation today patient appears to be doing well currently in regard to his wounds. Things look to  be doing quite well. With that being said he did have a result unfortunately on the pathology which showed that he did have a squamous cell carcinoma noted on the biopsy sample I sent last week. He is seeing his dermatologist tomorrow in that regard. With that being said other than that however he seems to really be making some pretty good progress here which is good news. No fevers, chills, nausea, vomiting, or diarrhea. 12/26; the patient has 2 open wounds remaining on the left leg. One is on the left anterior mid tibia and the  other is on the right lateral knee just outside of the popliteal fossa. 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 The patient has been to see dermatology Dr. Adolphus Birchwood who apparently is making him a referral to the [facility]. He does not yet have an appointment 10-12-2022 upon evaluation today patient appears to be doing well currently in regard to his wound. He has been tolerating the dressing changes without complication and overall feel like we are headed in the right direction. Fortunately I do not see any signs of infection locally or systemically at this time which is great news. No fevers, chills, nausea, vomiting, or diarrhea. 10-23-2022 upon evaluation today patient appears to be doing well currently in regard to his wound. He has been tolerating the dressing changes without complication. Fortunately there does not appear to be any signs of active infection locally nor systemically which is great news and overall I am extremely pleased with where we stand currently. No fevers, chills, nausea, vomiting, or diarrhea. 10-26-2022 upon evaluation today patient appears to be doing well currently in regard to his wounds. Everything is showing signs of improvement and this is great news. Fortunately I see no evidence of active infection systemically. He does seem to be doing much better in regard to the local infection in  regard to his leg. The smell is also greatly improved. Overall I am extremely happy with where we stand today. This is after just a few days with the antibiotic on board. 11-05-2022 upon evaluation today patient appears to be doing well currently in regard to his wounds although the wound where they performed the Mohs surgery does look a little bit hyper granulated I think switching to [facility] may be better for him. He voiced understanding. Fortunately there does not appear to be any evidence of active infection locally nor systemically at this time. 11-12-2022 upon evaluation today patient appears to be doing better in regard to both wounds he has been tolerating the dressing changes without complication. There is no signs of infection and in general I think you are doing quite well. No fevers, chills, nausea, vomiting, or diarrhea. GEARL, ALSOBROOKS R ([id number]) [id number] Page 9 of 11 11-20-2022 upon evaluation today patient appears to be doing well currently in regard to his wounds. He has been tolerating the dressing changes without complication. Fortunately there does not appear to be any signs of infection at this time. No fevers, chills, nausea, vomiting, or diarrhea. 11-27-2022 upon evaluation today patient appears to be doing somewhat poorly in regard to his leg in general he has a lot of areas where he looks like he had some spots that popped up. There with regard to new possible blisters. In general I am actually very concerned about the fact that the wrap may be causing some irritation here. I think that we can try to not do the wrap for 1 week, and given the prescription for mupirocin ointment which I will send into the pharmacy for him. 12-04-2022 upon evaluation today patient appears to be doing well currently in regard to his wounds in fact he appears to be pretty much completely healed based on what I am seeing at this point. I do not see any signs of  active infection locally nor systemically at this time which is great news. No fevers, chills, nausea, vomiting, or diarrhea. Readmission: 4-18 he unfortunately has an area on his left lateral leg that is a little bit different spot  from where we were previously caring for that appears to be in my opinion a cancerous lesion. I discussed that with him today he is aware of the situation.-2024 upon evaluation patient presents for readmission here in the clinic actually last saw him December 04, 2022. With that being said previously his dermatologist had asked that if there was something the need to be addressed from a dermatology standpoint and specifically biopsy that we make referral back to them to allow them to do it which I am definitely happy to do. 01-31-2023 upon evaluation today patient appears to be doing well currently in regard to his wound in fact this looks better he did see Dr. Adolphus Birchwood his dermatologist yesterday and they did perform a biopsy. With that being said he actually looks like he is doing much better Dr. Adolphus Birchwood feels like this may not be a cancerous lesion which will be very good news at the same time I definitely wanted to make sure especially considering his history and the way this looks when we saw him last week. Nonetheless I am extremely pleased with the fact that he is doing so much better at this point. 02-07-2023 upon evaluation today patient appears to be doing well currently in regard to his wound from the standpoint of size it has not gotten any larger. With that being said he does have some issues here still with what appears to be potentially some infection. Wound does not look quite as good as it did last week. We are still waiting on the results for the pathology. I did put a call into dermatology but I have not heard anything back from them as of yet. 02-14-2023 upon evaluation today patient appears to be doing well with regard to his wound infection is definitely under  control and looks much better. Fortunately I do not see any signs of systemic infection with anyone locally I think this is improved greatly. 02-19-2023 upon evaluation today patient appears to be doing well currently in regard to his wound which is actually measuring smaller and looking much better. Fortunately I do not see any evidence of active infection locally nor systemically which is great news and overall I am extremely pleased with where we stand today. 02-26-2023 upon evaluation today patient appears to be doing well currently in regard to his wound. He is actually been tolerating the dressing changes without complication. Fortunately there does not appear to be any signs of active infection locally nor systemically which is great news and overall I am extremely pleased with where we stand currently. 03-12-2023 upon evaluation today patient's wound actually showed signs of excellent improvement. I am very pleased with where we stand I do believe that we are making good progress here. I do not see any signs of active infection. 03-19-2023 upon evaluation today patient actually appears to be making excellent progress in regard to his leg and getting this closed. In fact is just a very small area that is actually open at this point. I am actually very pleased with what we are seeing today. 03-26-2023 upon evaluation today patient appears to be doing well currently in regard to his leg which is actually showing signs of being completely healed. Fortunately I do not see any signs of active infection locally or systemically which is great news and in general I do believe that we are moving in the right direction here. Readmission: 05-16-2023 upon evaluation today patient presents for reevaluation here in the clinic concerning a wound on  the left anterior/lateral lower extremity. This is similar to where the wound was last time I saw him but not exactly the same. Fortunately there does not appear to be  any signs of active infection at this time which is great news. The patient's past medical history really has not changed significantly since last time I saw him. 05-24-2023 upon evaluation patient actually appears to be doing excellent in regard to his leg ulcer. He has been tolerating the dressing changes without complication in general I do feel like there were making excellent headway towards complete closure. I do not see any signs of active infection at this point. Objective Constitutional Well-nourished and well-hydrated in no acute distress. Vitals Time Taken: 12:05 PM, Height: 75 in, Weight: 220 lbs, BMI: 27.5, Temperature: 97.9 F, Pulse: 48 bpm, Respiratory Rate: 18 breaths/min, Blood Pressure: 152/58 mmHg. Respiratory normal breathing without difficulty. Psychiatric this patient is able to make decisions and demonstrates good insight into disease process. Alert and Oriented x 3. pleasant and cooperative. General Notes: Patient's wound showed excellent granulation with really little to no hypergranulation at this point. He is tolerating the Hydrofera Blue without complication and seems to be doing quite well. Integumentary (Hair, Skin) Wound #21 status is Open. Original cause of wound was Gradually Appeared. The date acquired was: 04/08/2023. The wound has been in treatment 1 weeks. The wound is located on the Left,Lateral Lower Leg. The wound measures 5cm length x 0.34cm width x 0.1cm depth; 1.335cm^2 area and 0.134cm^3 volume. There is Fat Layer (Subcutaneous Tissue) exposed. There is no tunneling or undermining noted. There is a medium amount of serosanguineous drainage noted. There is small (1-33%) red granulation within the wound bed. There is a large (67-100%) amount of necrotic tissue within the wound bed including Adherent Slough. 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 There was a Double Layer Compression Therapy Procedure by Yevonne Pax, RN. Post procedure Diagnosis Wound #21: Same as Pre-Procedure Plan Follow-up Appointments: Return Appointment in 1 week. Bathing/ Shower/ Hygiene: May shower; gently cleanse wound with antibacterial soap, rinse and pat dry prior to dressing wounds Edema Control - Lymphedema / Segmental Compressive Device / Other: Patient to wear own compression stockings. Remove compression stockings every night before going to bed and put on every morning when getting up. - right Elevate, Exercise Daily and Avoid Standing for Long Periods of Time. Elevate legs to the level of the heart and pump ankles as often as possible Elevate leg(s) parallel to the floor when sitting. WOUND #21: - Lower Leg Wound Laterality: Left, Lateral Cleanser: Soap and Water 2 x Per Week/30 Days Discharge Instructions: Gently cleanse wound with antibacterial soap, rinse and pat dry prior to dressing wounds Cleanser: Wound Cleanser 2 x Per Week/30 Days Discharge Instructions: Wash your hands with soap and water. Remove old dressing, discard into plastic bag and place into trash. Cleanse the wound with Wound Cleanser prior to applying a clean dressing using gauze sponges, not tissues or cotton balls. Do not scrub or use excessive force. Pat dry using gauze sponges, not tissue or cotton balls. Peri-Wound Care: AandD Ointment 2  x Per Week/30 Days Discharge Instructions: Apply AandD Ointment as directed Prim Dressing: Hydrofera Blue Ready Transfer Foam,  2.5x2.5 (in/in) 2 x Per Week/30 Days ary Discharge Instructions: Apply Hydrofera Blue Ready to wound bed as directed Secondary Dressing: Zetuvit Plus 4x8 (in/in) 2 x Per Week/30 Days Com pression Wrap: Urgo K2, two layer compression system, regular 2 x Per Week/30 Days 1. I am going to recommend that we have the patient continue to monitor for any signs of infection or worsening. Based on what I am seeing I do believe that he should continue to wear the compression wrapping the Urgo K2 wrap is doing a great job. 2. I will also recommend that we continue with AandD ointment around the leg and edges of the wound followed by [facility] is the dressing of choice. We will see patient back for reevaluation in 1 week here in the clinic. If anything worsens or changes patient will contact our office for additional recommendations. Electronic Signature(s) Signed: 05/24/2023 2:04:56 PM By: Allen Derry PA-C Entered By: Allen Derry on 05/24/2023 11:04:56 Shawnee Knapp R ([id number]) [id number] Page 11 of 11 -------------------------------------------------------------------------------- SuperBill Details Patient Name: Date of Service: [name] 05/24/2023 Medical Record Number: [id number] Patient Account Number: [id number] Date of Birth/Sex: Treating RN: 08/07/1946 (77 y.o. 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 L97.822 Non-pressure chronic ulcer of other part of left lower leg with fat layer exposed I73.89 Other specified peripheral vascular diseases I48.0 Paroxysmal atrial fibrillation I10 Essential (primary) hypertension Facility  Procedures : CPT4 Code: [id number] Description: (Facility Use Only) 29581LT - APPLY MULTLAY COMPRS LWR LT LEG Modifier: Quantity: 1 Physician Procedures : CPT4 Code Description Modifier [id number] - WC PHYS LEVEL 3 - EST PT ICD-10 Diagnosis Description I89.0 Lymphedema, not elsewhere classified 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

## 2023-05-29 DIAGNOSIS — M1009 Idiopathic gout, multiple sites: Secondary | ICD-10-CM | POA: Diagnosis not present

## 2023-05-30 DIAGNOSIS — I428 Other cardiomyopathies: Secondary | ICD-10-CM | POA: Diagnosis not present

## 2023-05-30 DIAGNOSIS — Z85828 Personal history of other malignant neoplasm of skin: Secondary | ICD-10-CM | POA: Diagnosis not present

## 2023-05-30 DIAGNOSIS — Z7901 Long term (current) use of anticoagulants: Secondary | ICD-10-CM | POA: Diagnosis not present

## 2023-05-30 DIAGNOSIS — I739 Peripheral vascular disease, unspecified: Secondary | ICD-10-CM | POA: Diagnosis not present

## 2023-05-30 DIAGNOSIS — I480 Paroxysmal atrial fibrillation: Secondary | ICD-10-CM | POA: Diagnosis not present

## 2023-05-30 DIAGNOSIS — I872 Venous insufficiency (chronic) (peripheral): Secondary | ICD-10-CM | POA: Diagnosis not present

## 2023-05-30 DIAGNOSIS — I10 Essential (primary) hypertension: Secondary | ICD-10-CM | POA: Diagnosis not present

## 2023-05-30 DIAGNOSIS — I87333 Chronic venous hypertension (idiopathic) with ulcer and inflammation of bilateral lower extremity: Secondary | ICD-10-CM | POA: Diagnosis not present

## 2023-05-30 DIAGNOSIS — L97822 Non-pressure chronic ulcer of other part of left lower leg with fat layer exposed: Secondary | ICD-10-CM | POA: Diagnosis not present

## 2023-05-30 NOTE — Progress Notes (Addendum)
EMMETT, SCHURTZ [name] ([id number]) [id number] Page 1 of 4 Visit Report for 05/30/2023 Arrival Information Details Patient Name: Date of Service: [name] 05/30/2023 11:30 [name] M Medical Record Number: [id number] Patient Account Number: [id number] Date of Birth/Sex: Treating RN: 02-03-1946 (77 y.o. [name] Primary Care Janeka Libman: [name] Other Clinician: Betha Loa Referring Esbeydi Manago: Treating Viraaj Vorndran/Extender: [name] in Treatment: 2 Visit Information History Since Last Visit All ordered tests and consults were completed: No Patient Arrived: Ambulatory Added or deleted any medications: No Arrival Time: 11:36 Any new allergies or adverse reactions: No Transfer Assistance: None Had [name] fall or experienced change in No Patient Identification Verified: Yes activities of daily living that may affect Secondary Verification Process Completed: Yes risk of falls: Patient Requires Transmission-Based Precautions: No Signs or symptoms of abuse/neglect since last visito No Patient Has Alerts: No Hospitalized since last visit: No Implantable device outside of the clinic excluding No cellular tissue based products placed in the center since last visit: Has Dressing in Place as Prescribed: Yes Has Compression in Place as Prescribed: Yes Pain Present Now: No Electronic Signature(s) Signed: 05/30/2023 5:01:40 PM By: Betha Loa Entered By: Betha Loa on 05/30/2023 08:37:15 -------------------------------------------------------------------------------- Clinic Level of Care Assessment Details Patient Name: Date of Service: [name] [name] 05/30/2023 11:30 [name] M Medical Record Number: [id number] Patient Account Number: [id number] Date of Birth/Sex: Treating RN: 01/05/1946 (77 y.o. [name] Primary Care Feliberto Stockley: [name] Other Clinician: Betha Loa Referring Lizzete Gough: Treating  Edmund Rick/Extender: [name] in Treatment: 2 Clinic Level of Care Assessment Items TOOL 1 Quantity Score []  - 0 Use when EandM and Procedure is performed on INITIAL visit ASSESSMENTS - Nursing Assessment / Reassessment []  - 0 General Physical Exam (combine w/ comprehensive assessment (listed just below) when performed on new pt. [address] MOSES, MEIER [name] ([id number]) [id number] Page 2 of 4 []  - 0 Comprehensive Assessment (HX, ROS, Risk Assessments, Wounds Hx, etc.) ASSESSMENTS - Wound and Skin Assessment / Reassessment []  - 0 Dermatologic / Skin Assessment (not related to wound area) ASSESSMENTS - Ostomy and/or Continence Assessment and Care []  - 0 Incontinence Assessment and Management []  - 0 Ostomy Care Assessment and Management (repouching, etc.) PROCESS - Coordination of Care []  - 0 Simple Patient / Family Education for ongoing care []  - 0 Complex (extensive) Patient / Family Education for ongoing care []  - 0 Staff obtains Chiropractor, Records, T Results / Process Orders est []  - 0 Staff telephones HHA, Nursing Homes / Clarify orders / etc []  - 0 Routine Transfer to another Facility (non-emergent condition) []  - 0 Routine Hospital Admission (non-emergent condition) []  - 0 New Admissions / Manufacturing engineer / Ordering NPWT Apligraf, etc. , []  - 0 Emergency Hospital Admission (emergent condition) PROCESS - Special Needs []  - 0 Pediatric / Minor Patient Management []  - 0 Isolation Patient Management []  - 0 Hearing / Language / Visual special needs []  - 0 Assessment of Community assistance (transportation, [name], etc.) []  - 0 Additional assistance / Altered mentation []  - 0 Support Surface(s) Assessment (bed, cushion, seat, etc.) INTERVENTIONS - Miscellaneous []  - 0 External ear exam []  - 0 Patient Transfer (multiple staff / Nurse, adult / Similar devices) []  - 0 Simple Staple / Suture removal (25 or  less) []  - 0 Complex Staple / Suture removal (26 or more) []  - 0 Hypo/Hyperglycemic Management (do not check if billed separately) []  - 0 Ankle / Brachial  Index (ABI) - do not check if billed separately Has the patient been seen at the hospital within the last three years: Yes Total Score: 0 Level Of Care: ____ Electronic Signature(s) Signed: 05/30/2023 5:01:40 PM By: Betha Loa Entered By: Betha Loa on 05/30/2023 08:58:24 -------------------------------------------------------------------------------- Compression Therapy Details Patient Name: Date of Service: [name], [name] 05/30/2023 11:30 [name] M Medical Record Number: [id number] Patient Account Number: [id number] Date of Birth/Sex: Treating RN: 05/05/1946 (77 y.o. [name] Primary Care Alaa Mullally: [name] Other Clinician: Rasool, Pennel [name] ([id number]) [id number] Page 3 of 4 Referring Aydyn Testerman: Treating Codee Bloodworth/Extender: [name] in Treatment: 2 Compression Therapy Performed for Wound Assessment: Wound #21 Left,Lateral Lower Leg Performed By: Farrel Gordon, Angie, Compression Type: Double Layer Notes patient tolerated wrap well Electronic Signature(s) Signed: 05/30/2023 5:01:40 PM By: Betha Loa Entered By: Betha Loa on 05/30/2023 08:38:41 -------------------------------------------------------------------------------- Encounter Discharge Information Details Patient Name: Date of Service: [name], [name] 05/30/2023 11:30 [name] M Medical Record Number: [id number] Patient Account Number: [id number] Date of Birth/Sex: Treating RN: 1946/06/20 (77 y.o. [name] Primary Care Pansie Guggisberg: [name] Other Clinician: Betha Loa Referring Xitlaly Ault: Treating Brenson Hartman/Extender: [name] in Treatment: 2 Encounter Discharge Information Items Discharge Condition: Stable Ambulatory Status:  Ambulatory Discharge Destination: Home Transportation: Private Auto Accompanied By: self Schedule Follow-up Appointment: Yes Clinical Summary of Care: Electronic Signature(s) Signed: 05/30/2023 5:01:40 PM By: Betha Loa Entered By: Betha Loa on 05/30/2023 08:39:40 -------------------------------------------------------------------------------- Wound Assessment Details Patient Name: Date of Service: [name] [name] 05/30/2023 11:30 [name] M Medical Record Number: [id number] Patient Account Number: [id number] Date of Birth/Sex: Treating RN: July 18, 1946 (77 y.o. [name]) Yevonne Pax Primary Care Trixy Loyola: [name] Other Clinician: Betha Loa Referring Jolie Strohecker: Treating Refugia Laneve/Extender: [name] in Treatment: 2 Wound Status Wound Number: 21 Primary Etiology: Vasculitis [name], [name] ([id number]) [id number] Page 4 of 4 Wound Location: Left, Lateral Lower Leg Wound Status: Open Wounding Event: Gradually Appeared Comorbid History: Arrhythmia, Hypertension, Gout Date Acquired: 04/08/2023 Weeks Of Treatment: 2 Clustered Wound: No Wound Measurements Length: (cm) 5 Width: (cm) 3 Depth: (cm) 0.1 Area: (cm) 11.781 Volume: (cm) 1.178 % Reduction in Area: 14.3% % Reduction in Volume: 14.3% Epithelialization: None Wound Description Classification: Full Thickness Without Exposed Suppor Exudate Amount: Medium Exudate Type: Serosanguineous Exudate Color: red, brown t Structures Foul Odor After Cleansing: No Slough/Fibrino Yes Wound Bed Granulation Amount: Small (1-33%) Exposed Structure Granulation Quality: Red Fascia Exposed: No Necrotic Amount: Large (67-100%) Fat Layer (Subcutaneous Tissue) Exposed: Yes Tendon Exposed: No Muscle Exposed: No Joint Exposed: No Bone Exposed: No Electronic Signature(s) Signed: 06/07/2023 11:59:03 AM By: Yevonne Pax RN Previous Signature: 05/30/2023 5:00:29 PM Version By: Angelina Pih Previous Signature: 05/30/2023 5:01:40 PM Version By: Betha Loa Entered By: Yevonne Pax on 06/06/2023 09:52:56

## 2023-05-31 NOTE — Progress Notes (Signed)
IHOR, POLKA R ([id number]) [id number] Page 1 of 2 Visit Report for 05/30/2023 Physician Orders Details Patient Name: Date of Service: [name] 05/30/2023 11:30 A M Medical Record Number: [id number] Patient Account Number: [id number] Date of Birth/Sex: Treating RN: 1946/04/08 (77 y.o. [name] Primary Care Provider: [name] Other Clinician: Betha Loa Referring Provider: Treating Provider/Extender: [name] in Treatment: 2 Verbal / Phone Orders: No Diagnosis Coding Follow-up Appointments Return Appointment in 1 week. Bathing/ Shower/ Hygiene May shower; gently cleanse wound with antibacterial soap, rinse and pat dry prior to dressing wounds Edema Control - Lymphedema / Segmental Compressive Device / Other Patient to wear own compression stockings. Remove compression stockings every night before going to bed and put on every morning when getting up. - right Elevate, Exercise Daily and A void Standing for Long Periods of Time. Elevate legs to the level of the heart and pump ankles as often as possible Elevate leg(s) parallel to the floor when sitting. Wound Treatment Wound #21 - Lower Leg Wound Laterality: Left, Lateral Cleanser: Soap and Water 2 x Per Week/30 Days Discharge Instructions: Gently cleanse wound with antibacterial soap, rinse and pat dry prior to dressing wounds Cleanser: Wound Cleanser 2 x Per Week/30 Days Discharge Instructions: Wash your hands with soap and water. Remove old dressing, discard into plastic bag and place into trash. Cleanse the wound with Wound Cleanser prior to applying a clean dressing using gauze sponges, not tissues or cotton balls. Do not scrub or use excessive force. Pat dry using gauze sponges, not tissue or cotton balls. Peri-Wound Care: AandD Ointment 2 x Per Week/30 Days Discharge Instructions: Apply AandD Ointment as directed Prim Dressing: Hydrofera Blue  Ready Transfer Foam, 2.5x2.5 (in/in) 2 x Per Week/30 Days ary Discharge Instructions: Apply Hydrofera Blue Ready to wound bed as directed Secondary Dressing: Zetuvit Plus 4x8 (in/in) 2 x Per Week/30 Days Compression Wrap: Urgo K2, two layer compression system, regular 2 x Per Week/30 Days Electronic Signature(s) Signed: 05/30/2023 5:01:40 PM By: Betha Loa Signed: 05/31/2023 1:57:59 PM By: Allen Derry PA-C Entered By: Betha Loa on 05/30/2023 08:39:12 Shawnee Knapp R ([id number]) [id number] Page 2 of 2 -------------------------------------------------------------------------------- SuperBill Details Patient Name: Date of Service: [name] 05/30/2023 Medical Record Number: [id number] Patient Account Number: [id number] Date of Birth/Sex: Treating RN: Nov 04, 1945 (77 y.o. 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

## 2023-06-06 ENCOUNTER — Encounter: Payer: Medicare PPO | Admitting: Internal Medicine

## 2023-06-06 DIAGNOSIS — L97822 Non-pressure chronic ulcer of other part of left lower leg with fat layer exposed: Secondary | ICD-10-CM | POA: Diagnosis not present

## 2023-06-06 DIAGNOSIS — Z85828 Personal history of other malignant neoplasm of skin: Secondary | ICD-10-CM | POA: Diagnosis not present

## 2023-06-06 DIAGNOSIS — I87333 Chronic venous hypertension (idiopathic) with ulcer and inflammation of bilateral lower extremity: Secondary | ICD-10-CM | POA: Diagnosis not present

## 2023-06-06 DIAGNOSIS — Z7901 Long term (current) use of anticoagulants: Secondary | ICD-10-CM | POA: Diagnosis not present

## 2023-06-06 DIAGNOSIS — I428 Other cardiomyopathies: Secondary | ICD-10-CM | POA: Diagnosis not present

## 2023-06-06 DIAGNOSIS — I480 Paroxysmal atrial fibrillation: Secondary | ICD-10-CM | POA: Diagnosis not present

## 2023-06-06 DIAGNOSIS — I10 Essential (primary) hypertension: Secondary | ICD-10-CM | POA: Diagnosis not present

## 2023-06-06 DIAGNOSIS — I739 Peripheral vascular disease, unspecified: Secondary | ICD-10-CM | POA: Diagnosis not present

## 2023-06-06 DIAGNOSIS — I872 Venous insufficiency (chronic) (peripheral): Secondary | ICD-10-CM | POA: Diagnosis not present

## 2023-06-06 DIAGNOSIS — L959 Vasculitis limited to the skin, unspecified: Secondary | ICD-10-CM | POA: Diagnosis not present

## 2023-06-07 NOTE — Progress Notes (Signed)
[name], [name] ([id number]) [id number] Page 1 of 9 Visit Report for 05/24/2023 Arrival Information Details Patient Name: Date of Service: [name] [name] 05/24/2023 12:00 PM Medical Record Number: [id number] Patient Account Number: [id number] Date of Birth/Sex: Treating RN: Jul 16, 1946 (77 y.o. [name]) Yevonne Pax Primary Care Jessabelle Markiewicz: [name] Other Clinician: Referring Nisha Dhami: Treating [name]: [name] in Treatment: 1 Visit Information History Since Last Visit Added or deleted any medications: No Patient Arrived: Ambulatory Any new allergies or adverse reactions: No Arrival Time: 12:04 Had [name] fall or experienced change in No Accompanied By: self activities of daily living that may affect Transfer Assistance: None risk of falls: Patient Identification Verified: Yes Signs or symptoms of abuse/neglect since last visito No Secondary Verification Process Completed: Yes Hospitalized since last visit: No Patient Requires Transmission-Based Precautions: No Implantable device outside of the clinic excluding No Patient Has Alerts: No cellular tissue based products placed in the center since last visit: Has Dressing in Place as Prescribed: Yes Pain Present Now: No Electronic Signature(s) Signed: 06/07/2023 11:59:03 AM By: Yevonne Pax RN Entered By: Yevonne Pax on 05/24/2023 09:05:15 -------------------------------------------------------------------------------- Clinic Level of Care Assessment Details Patient Name: Date of Service: [name] [name] 05/24/2023 12:00 PM Medical Record Number: [id number] Patient Account Number: [id number] Date of Birth/Sex: Treating RN: [birth year] (77 y.o. [name]) Yevonne Pax Primary Care Suni Jarnagin: [name] Other Clinician: Referring Kyden Potash: Treating Demara Lover/Extender: [name] in Treatment: 1 Clinic Level of Care Assessment Items TOOL 1 Quantity  Score []  - 0 Use when EandM and Procedure is performed on INITIAL visit ASSESSMENTS - Nursing Assessment / Reassessment []  - 0 General Physical Exam (combine w/ comprehensive assessment (listed just below) when performed on new pt. evals) []  - 0 Comprehensive Assessment (HX, ROS, Risk Assessments, Wounds Hx, etc.) [name], [name] ([id number]) [id number] Page 2 of 9 ASSESSMENTS - Wound and Skin Assessment / Reassessment []  - 0 Dermatologic / Skin Assessment (not related to wound area) ASSESSMENTS - Ostomy and/or Continence Assessment and Care []  - 0 Incontinence Assessment and Management []  - 0 Ostomy Care Assessment and Management (repouching, etc.) PROCESS - Coordination of Care []  - 0 Simple Patient / Family Education for ongoing care []  - 0 Complex (extensive) Patient / Family Education for ongoing care []  - 0 Staff obtains Chiropractor, Records, T Results / Process Orders est []  - 0 Staff telephones HHA, Nursing Homes / Clarify orders / etc []  - 0 Routine Transfer to another Facility (non-emergent condition) []  - 0 Routine Hospital Admission (non-emergent condition) []  - 0 New Admissions / Manufacturing engineer / Ordering NPWT Apligraf, etc. , []  - 0 Emergency Hospital Admission (emergent condition) PROCESS - Special Needs []  - 0 Pediatric / Minor Patient Management []  - 0 Isolation Patient Management []  - 0 Hearing / Language / Visual special needs []  - 0 Assessment of Community assistance (transportation, [name], etc.) []  - 0 Additional assistance / Altered mentation []  - 0 Support Surface(s) Assessment (bed, cushion, seat, etc.) INTERVENTIONS - Miscellaneous []  - 0 External ear exam []  - 0 Patient Transfer (multiple staff / Nurse, adult / Similar devices) []  - 0 Simple Staple / Suture removal (25 or less) []  - 0 Complex Staple / Suture removal (26 or more) []  - 0 Hypo/Hyperglycemic Management (do not check if billed  separately) []  - 0 Ankle / Brachial Index (ABI) - do not check if billed separately Has the patient been seen at the hospital  within the last three years: Yes Total Score: 0 Level Of Care: ____ Electronic Signature(s) Signed: 06/07/2023 11:59:03 AM By: Yevonne Pax RN Entered By: Yevonne Pax on 05/24/2023 09:34:51 -------------------------------------------------------------------------------- Compression Therapy Details Patient Name: Date of Service: [name], [name] 05/24/2023 12:00 PM Medical Record Number: [id number] Patient Account Number: [id number] Date of Birth/Sex: Treating RN: 1946/06/01 (77 y.o. [name]) Yevonne Pax Primary Care Laverle Pillard: [name] Other Clinician: Referring Jerzee Jerome: Treating Maleena Eddleman/Extender: [name] in Treatment: 1 [place], [place] [name] ([id number]) [id number] Page 3 of 9 Compression Therapy Performed for Wound Assessment: Wound #21 Left,Lateral Lower Leg Performed By: Clinician Yevonne Pax, RN Compression Type: Double Layer Post Procedure Diagnosis Same as Pre-procedure Electronic Signature(s) Signed: 06/07/2023 11:59:03 AM By: Yevonne Pax RN Entered By: Yevonne Pax on 05/24/2023 09:34:43 -------------------------------------------------------------------------------- Encounter Discharge Information Details Patient Name: Date of Service: [name], [name] 05/24/2023 12:00 PM Medical Record Number: [id number] Patient Account Number: [id number] Date of Birth/Sex: Treating RN: 1945/10/12 (77 y.o. [name] Primary Care Donnavan Covault: [name] Other Clinician: Referring Abdullah Rizzi: Treating Arshia Rondon/Extender: [name] in Treatment: 1 Encounter Discharge Information Items Discharge Condition: Stable Ambulatory Status: Ambulatory Discharge Destination: Home Transportation: Private Auto Accompanied By: self Schedule Follow-up Appointment: Yes Clinical Summary of  Care: Electronic Signature(s) Signed: 06/07/2023 11:59:03 AM By: Yevonne Pax RN Entered By: Yevonne Pax on 05/24/2023 09:35:52 -------------------------------------------------------------------------------- Lower Extremity Assessment Details Patient Name: Date of Service: [name] [name] 05/24/2023 12:00 PM Medical Record Number: [id number] Patient Account Number: [id number] Date of Birth/Sex: Treating RN: 08-12-1946 (77 y.o. [name] Primary Care Lovette Merta: [name] Other Clinician: Referring Yannick Steuber: Treating [name]: [name] in Treatment: 1 Edema Assessment Assessed: Kyra Searles: No] [Right: No] M[LeftKANNON, [name] ([id number])] [Right: [id number] Page 4 of 9] Edema: [Left: N] [Right: o] Calf Left: Right: Point of Measurement: 37 cm From Medial Instep 39 cm Ankle Left: Right: Point of Measurement: 12 cm From Medial Instep 24 cm Knee To Floor Left: Right: From Medial Instep 51 cm Vascular Assessment Pulses: Dorsalis Pedis Palpable: [Left:Yes] Extremity colors, hair growth, and conditions: Extremity Color: [Left:Hyperpigmented] Temperature of Extremity: [Left:Warm] Capillary Refill: [Left:< 3 seconds] Dependent Rubor: [Left:Yes No] Toe Nail Assessment Left: Right: Thick: No Discolored: No Deformed: No Improper Length and Hygiene: No Electronic Signature(s) Signed: 06/07/2023 11:59:03 AM By: Yevonne Pax RN Entered By: Yevonne Pax on 05/24/2023 09:12:40 -------------------------------------------------------------------------------- Multi Wound Chart Details Patient Name: Date of Service: [name] [name], [name] 05/24/2023 12:00 PM Medical Record Number: [id number] Patient Account Number: [id number] Date of Birth/Sex: Treating RN: Feb 28, 1946 (77 y.o. [name]) Yevonne Pax Primary Care Audryanna Zurita: [name] Other Clinician: Referring Dennise Raabe: Treating Rocio Roam/Extender: [name] in Treatment: 1 Vital Signs Height(in): 75 Pulse(bpm): 48 Weight(lbs): 220 Blood Pressure(mmHg): 152/58 Body Mass Index(BMI): 27.5 Temperature(F): 97.9 Respiratory Rate(breaths/min): 18 [21:Photos:] [N/[name]:N/[name]] Left, Lateral Lower Leg N/[name] N/[name] Wound Location: Gradually Appeared N/[name] N/[name] Wounding Event: Vasculitis N/[name] N/[name] Primary Etiology: Arrhythmia, Hypertension, Gout N/[name] N/[name] Comorbid History: 04/08/2023 N/[name] N/[name] Date Acquired: 1 N/[name] N/[name] Weeks of Treatment: Open N/[name] N/[name] Wound Status: No N/[name] N/[name] Wound Recurrence: 5x0.34x0.1 N/[name] N/[name] Measurements L x W x D (cm) 1.335 N/[name] N/[name] [name] (cm) : rea 0.134 N/[name] N/[name] Volume (cm) : 90.30% N/[name] N/[name] % Reduction in [name] rea: 90.20% N/[name] N/[name] % Reduction in Volume: Full Thickness Without Exposed N/[name] N/[name] Classification: Support Structures Medium N/[name] N/[name] Exudate Amount: Serosanguineous N/[name]  N/[name] Exudate Type: red, brown N/[name] N/[name] Exudate Color: Small (1-33%) N/[name] N/[name] Granulation Amount: Red N/[name] N/[name] Granulation Quality: Large (67-100%) N/[name] N/[name] Necrotic Amount: Fat Layer (Subcutaneous Tissue): Yes N/[name] N/[name] Exposed Structures: Fascia: No Tendon: No Muscle: No Joint: No Bone: No None N/[name] N/[name] Epithelialization: Treatment Notes Electronic Signature(s) Signed: 06/07/2023 11:59:03 AM By: Yevonne Pax RN Entered By: Yevonne Pax on 05/24/2023 09:12:53 -------------------------------------------------------------------------------- Multi-Disciplinary Care Plan Details Patient Name: Date of Service: [name], [name] 05/24/2023 12:00 PM Medical Record Number: [id number] Patient Account Number: [id number] Date of Birth/Sex: Treating RN: [birth year] (77 y.o. [name]) Yevonne Pax Primary Care Therin Vetsch: [name] Other Clinician: Referring Leannah Guse: Treating Jaquel Coomer/Extender: [name] in Treatment: 1 Active Inactive Wound/Skin Impairment Nursing Diagnoses: Knowledge deficit related to ulceration/compromised  skin integrity Goals: Patient/caregiver will verbalize understanding of skin care regimen Date Initiated: 05/16/2023 Target Resolution Date: 06/16/2023 Goal Status: Active Ulcer/skin breakdown will have [name] volume reduction of 30% by week 4 [name], [name] ([id number]) [id number] Page 6 of 9 Date Initiated: 05/16/2023 Target Resolution Date: 06/16/2023 Goal Status: Active Ulcer/skin breakdown will have [name] volume reduction of 50% by week 8 Date Initiated: 05/16/2023 Target Resolution Date: 07/16/2023 Goal Status: Active Ulcer/skin breakdown will have [name] volume reduction of 80% by week 12 Date Initiated: 05/16/2023 Target Resolution Date: 08/16/2023 Goal Status: Active Ulcer/skin breakdown will heal within 14 weeks Date Initiated: 05/16/2023 Target Resolution Date: 09/15/2023 Goal Status: Active Interventions: Assess patient/caregiver ability to obtain necessary supplies Assess patient/caregiver ability to perform ulcer/skin care regimen upon admission and as needed Assess ulceration(s) every visit Notes: Electronic Signature(s) Signed: 06/07/2023 11:59:03 AM By: Yevonne Pax RN Entered By: Yevonne Pax on 05/24/2023 09:13:22 -------------------------------------------------------------------------------- Pain Assessment Details Patient Name: Date of Service: [name], [name] 05/24/2023 12:00 PM Medical Record Number: [id number] Patient Account Number: [id number] Date of Birth/Sex: Treating RN: [birth year] (77 y.o. [name] Primary Care Jakira Mcfadden: [name] Other Clinician: Referring Coulter Oldaker: Treating [name]: [name] in Treatment: 1 Active Problems Location of Pain Severity and Description of Pain Patient Has Paino No Site Locations Pain Management and Medication Current Pain Management: Electronic Signature(s) [name], [name] ([id number]) [id number] Page 7 of 9 Signed: 06/07/2023 11:59:03  AM By: Yevonne Pax RN Entered By: Yevonne Pax on 05/24/2023 09:05:45 -------------------------------------------------------------------------------- Patient/Caregiver Education Details Patient Name: Date of Service: [name] [name] 8/16/2024andnbsp12:00 PM Medical Record Number: [id number] Patient Account Number: [id number] Date of Birth/Gender: Treating RN: 21-Dec-1945 (77 y.o. [name]) Yevonne Pax Primary Care Physician: [name] Other Clinician: Referring Physician: Treating Physician/Extender: [name] in Treatment: 1 Education Assessment Education Provided To: Patient Education Topics Provided Wound/Skin Impairment: Handouts: Caring for Your Ulcer Methods: Explain/Verbal Responses: State content correctly Electronic Signature(s) Signed: 06/07/2023 11:59:03 AM By: Yevonne Pax RN Entered By: Yevonne Pax on 05/24/2023 09:13:34 -------------------------------------------------------------------------------- Wound Assessment Details Patient Name: Date of Service: [name] [name], [name] 05/24/2023 12:00 PM Medical Record Number: [id number] Patient Account Number: [id number] Date of Birth/Sex: Treating RN: 1945/10/17 (77 y.o. [name] Primary Care Desirea Mizrahi: [name] Other Clinician: Referring Haik Mahoney: Treating [name]: [name] in Treatment: 1 Wound Status Wound Number: 21 Primary Etiology: Vasculitis Wound Location: Left, Lateral Lower Leg Wound Status: Open Wounding Event: Gradually Appeared Comorbid History: Arrhythmia, Hypertension, Gout Date Acquired: 04/08/2023 Weeks Of Treatment: 1 Clustered Wound: No [name], [name] ([id number]) [id number] Page 8 of 9 Photos Wound Measurements Length: (cm) 5  Width: (cm) 0.34 Depth: (cm) 0.1 Area: (cm) 1.335 Volume: (cm) 0.134 % Reduction in Area: 90.3% % Reduction in Volume: 90.2% Epithelialization: None Tunneling:  No Undermining: No Wound Description Classification: Full Thickness Without Exposed Suppor Exudate Amount: Medium Exudate Type: Serosanguineous Exudate Color: red, brown t Structures Foul Odor After Cleansing: No Slough/Fibrino Yes Wound Bed Granulation Amount: Small (1-33%) Exposed Structure Granulation Quality: Red Fascia Exposed: No Necrotic Amount: Large (67-100%) Fat Layer (Subcutaneous Tissue) Exposed: Yes Necrotic Quality: Adherent Slough Tendon Exposed: No Muscle Exposed: No Joint Exposed: No Bone Exposed: No Electronic Signature(s) Signed: 06/07/2023 11:59:03 AM By: Yevonne Pax RN Entered By: Yevonne Pax on 05/24/2023 09:11:41 -------------------------------------------------------------------------------- Vitals Details Patient Name: Date of Service: [name] [name], [name] 05/24/2023 12:00 PM Medical Record Number: [id number] Patient Account Number: [id number] Date of Birth/Sex: Treating RN: 1945-12-11 (77 y.o. [name]) Yevonne Pax Primary Care Tameko Halder: [name] Other Clinician: Referring Robie Oats: Treating Rachid Parham/Extender: [name] in Treatment: 1 Vital Signs Time Taken: 12:05 Temperature (F): 97.9 Height (in): 75 Pulse (bpm): 48 Weight (lbs): 220 Respiratory Rate (breaths/min): 18 Body Mass Index (BMI): 27.5 Blood Pressure (mmHg): 152/58 Reference Range: 80 - 120 mg / dl Electronic Signature(s) Signed: 06/07/2023 11:59:03 AM By: Yevonne Pax RN [place],Signed: 06/07/2023 11:59:03 AM By: Yevonne Pax RN Mathis Fare [name] ([id number]) [id number] Page 9 of 9 Entered By: Yevonne Pax on 05/24/2023 09:05:39

## 2023-06-07 NOTE — Progress Notes (Signed)
[name], [name] ([id number]) [id number] Page 1 of 10 Visit Report for 06/06/2023 HPI Details Patient Name: Date of Service: [name] 06/06/2023 12:30 PM Medical Record Number: [id number] Patient Account Number: [id number] Date of Birth/Sex: Treating RN: 10/31/1945 (77 y.o. [name]) Yevonne Pax Primary Care Provider: [name] Other Clinician: Referring Provider: Treating Provider/Extender: RO BSO N, MICHA EL Shanda Bumps in Treatment: 3 History of Present Illness HPI Description: 77 year old male who has [name] past medical history of essential hypertension, chronic atrial fibrillation, peripheral vascular disease, nonischemic cardiomyopathy,venous stasis dermatitis, gouty arthropathy, basal cell carcinoma of the right lower extremity, benign prostatic hypertrophy, long- term use of anticoagulation therapy, hyperglycemia and exercise intolerance has never been [name] smoker. the patient has had [name] vascular workup over 7 years ago and said everything was normal at that stage. He does not have any chronic problems except for cardiac issues which he sees [name] cardiologist in [place]. 08/15/2017 -- arterial and venous duplex studies still pending. 08/23/2017 -- venous reflux studies done on 08/13/2017 shows venous incompetence throughout the left lower extremity deep system and focally at the left saphenofemoral junction. 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 No evidence of SVT or DVT in bilateral lower extremities The patient has an appointment at the end of the month to get his arterial duplex study done 09/05/2017 -- the patient was seen at the vein and vascular office yesterday by Bary Castilla. ABI studies were notable for medial calcification and the toe brachial indices were normal and bilateral ankle-brachial) waveforms were normal with triphasic flow. After review of his venous studies he was not [name] candidate for laser ablation  and his lymphedema was to be treated with compression stockings and lymphedema pump pumps 09/12/2017 -- had [name] low arterial study done at the [place] [facility] -- unable to obtain reliable ABI is due to medial calcification. Bilateral toe indices were normal with the right being 1.01 and the left being 0.92 and the waveforms were triphasic bilaterally. he did get hold of 30-40 mm compression stockings but is unable to put these on. We will try and get him alternative compression stockings. 09/26/17- he is here in follow up evaluation of [name] right lower extremity ulcer;he is compliant in wearing compression stocking; ulcer almost epithelialized , anticipate healing next appointment Readmission: 11/17 point upon evaluation patient's wound currently that he is seeing [place] for today is [name] skin cancerous lesion that was cleared away by his dermatologist on the left medial calf region. He tells me that this is [name] very similar thing to what he had done previously in fact the last time he saw him in 2018 this was also what was going on at that point. Nonetheless he feels that based on what he seeing currently that this is just having [name] lot of harder time healing although it is much closer to the surface than what he is experienced in the past. He notes that the initial removal was in June 2022 which was this year this is now November and still has not closed. He does have some edema and definitely I think that there is some venous component to his slow healing here. Also think that we can do something better than Vaseline to try to help with getting this to clear up as quickly as possible. He does have [name] history of atrial fibrillation and is on Eliquis otherwise he really has no major medical problems that would affect wound  healing. 09/07/2021 upon evaluation today patient actually appears to be doing significantly better after having wrapped him last week. Overall I think that this is  making significant improvements at this time which is great news. I do not see any evidence of infection which is great news as well. No fevers, chills, nausea, vomiting, or diarrhea. 09/14/2021 upon evaluation today patient appears to be doing well with regard to his leg ulcer. He has been tolerating the dressing changes and overall I think that he is making excellent progress. I do not see any signs of active infection at this time. 09/21/2021 upon evaluation today patient actually appears to be making good progress with regard to his wound this is again measuring smaller today no debridement seems to be necessary. We have been using [name] silver collagen dressing and I think that is doing an awesome job. 09/28/2021 upon evaluation today patient appears to be doing well with regard to his leg currently. I do not see any signs of active infection at this time which is great news. No fevers, chills, nausea, vomiting, or diarrhea. I think this wound is very close to complete resolution. 10/12/2021 upon evaluation today patient actually appears to be doing awesome in regard to his leg ulcer. In fact this appears to be completely healed based on what I am seeing currently. I do not see any evidence of active infection locally nor systemically at this time which is also great news. No fevers, chills, nausea, vomiting, or diarrhea. Readmission: 12/07/2021 upon evaluation today patient presents for readmission here in the clinic. He was discharged on 10/12/2021 is completely healed. Unfortunately this has reopened at this point and he is having continual issues with new blisters over both lower extremities. This is even worse than what we previously saw. Nonetheless we did actually check his ABIs today and it did reveal that his ABIs were 0.55 on the left and 0.57 on the right. Subsequently this is [name] definite change from his last arterial study which showed that he did have good blood flow at 1.01 on the right and  0.92 on the left and that was right at the beginning of 2019. Nonetheless based on what we see currently I do think he tolerated the 3 layer compression wrap but I do believe that we probably need to get him tested for his arterial flow in order to see where things stand and if there is something we can do there that would help prevent this from continue to be an ARTURO, GOETZE ([id number]) [id number] Page 2 of 10 ongoing issue. He did not utilize compression socks in the interim from when he was last here till this time. That something is probably going to need lifelong going forward as well. 3/9; patient presents for follow-up. He has no issues or complaints today. He tolerated the compression wrap well. He had ABIs with TBI's done. He denies signs of infection. 12/21/2021 upon evaluation today patient appears to be doing well with regard to the wounds on his legs. Both are showing signs of significant improvement which is great news although I do believe some sharp debridement would be of benefit here as well. 12/28/2021 upon evaluation today patient appears to be doing well with regard to his wounds. Everything is showing signs of excellent improvement which I am very pleased about. I think that we are headed in the right direction here. Fortunately there does not appear to be any evidence of infection which is great news there is [name]  little bit of hypergranulation. 01/04/2022 upon evaluation today patient appears to be doing well with regard to his wounds 2 of them are healed 1 is almost so and the other 1 is significantly better. Overall I am extremely pleased with where we stand and I think that he is making excellent progress here. I do not see any evidence of active infection locally nor systemically at this time. 01-16-2022 upon evaluation today patient's wound on the left leg is showing signs of doing quite well. Has not completely cleared at this point but it is  much improved. Fortunately I do not see any signs of infection at this time. No fevers, chills, nausea, vomiting, or diarrhea. 01-23-2022 upon evaluation today patient's wound of the left leg actually appears to be pretty much completely healed which is great news. I do not see any signs of active infection locally or systemically which is excellent. With that being said on the right leg what wound is measuring smaller the other 1 is [name] new wound that just showed up fortunately its not too bad. Has been using Xeroform here and that seems to be doing decently well which is great news. Unfortunately his blood pressure is significantly high we gave him the readings for the past 4-5 visits as well as [name] recommendation to make an appointment to go discuss this with his primary care provider patient states that he is going to look into doing this. 01-30-2022 upon evaluation today patient appears to be doing well with regard to his left leg everything appears to be healed. On the right leg the more anterior wound is healed the more medial wound that I been concerned about [name] possible skin cancer unfortunately still does not look great to me. I do believe that we should probably do [name] biopsy I have talked about it with him [name] few times I think though it is probably time to go ahead and do this at this point. 02-09-2022 upon evaluation today patient appears to be doing well with regard to his legs. On the left this appears to be completely healed. On the right he does have 2 areas and be perfectly honest one of them is [name] skin cancer that he is going to the Mohs surgery clinic for the other seems to be healing nicely. Readmission: 08-02-2022 upon evaluation today patient appears for reevaluation here in our clinic concerning issues that he has been having with wounds over the bilateral lower extremities. I last saw him in May 2023 and at that point we had him completely healed. Unfortunately he is tells me this has  broken down to some degree since that point. 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 He tells me this 1 seems to just be getting bigger and bigger not improving. Fortunately he is not having any significant pain which is good news he does have quite [name] bit of swelling and he tells me that his fluid pills are not recommended for him to take daily but just in 3-day intervals here and there. 08-09-2022 upon evaluation today patient appears to be doing still somewhat poorly in regard to his legs although in general he does not appear to be feeling as good as he has been. Fortunately there does not appear to be any signs  of infection which is good news. With that being said he is having some issues here with having and overall poor feeling in general which again is good I think going to be the biggest complicating factor. He actually seems to be coughing I do not hear any wheezing right now I did listen to his chest he did not have good airflow down low however makes me suspicious for bronchitis or even possibly pneumonia which could be part of what is going on here as well. Fortunately I do not see any evidence of anything worsening in regard to his legs but I definitely believe that he needs to continue with the compression wraps he took them off yesterday to shower has not had anything on for 24 hours that is why his legs are so swollen today. With regard to his pathology report I did review that it showed some squamous abnormality but no signs of distinct carcinoma. With that being said it was saying that it could be adjacent to [name] squamous cell carcinoma nonetheless my suggestion is can be that we have the patient take copy of this report and give it to his Mohs surgeon in order for them to see if there  is anything they feel like needs to be done further. With that being said right now I feel like the primary thing is going to be for [place] to try to get his swelling down and keep that down into that hand since he is having so much drainage I believe we can have to bring him in for dressing changes twice [name] week doing [name] nurse visit on Mondays. 11/9; since the patient was last here he spent the night in the emergency room he received IV Lasix. Also received antibiotics although he was not discharged on either 1 of these. He also saw his cardiology office who put him on regular Lasix 20 mg [previously on as needed Lasix 20 mg]. Per our intake nurse the swelling in his legs is remarkably better but he still has bilateral lower extremity wounds. He still has wounds on the bilateral lower extremities most problematically on the left lateral calf. He has been using silver alginate under 3 layer compression. 08-23-2022 upon evaluation today patient appears to be doing much better than the last time I saw him 2 weeks ago. At that point I was very concerned about how he was doing he did see Dr. Sherrie Mustache his primary care provider they got him on some blood pressure medication in general his color and overall appearance looks to be doing much improved compared to the last time I saw him. 09-04-2022 upon evaluation today patient appears to be doing well currently in regard to his wounds. Everything is showing signs of improvement which is great news. Fortunately there does not appear to be any signs of active infection locally or systemically at this time. No fevers, chills, nausea, vomiting, or diarrhea. 09-10-2022 upon evaluation today patient appears to be doing better in regard to his wounds although the [facility] was extremely stuck to the wound bed. Fortunately there does not appear to be any signs of infection locally or systemically at this time which is great news. No fevers, chills, nausea, vomiting,  or diarrhea. 09-17-2022 upon evaluation today patient appears to be doing well currently in regard to his wounds in general. The right leg actually showing signs of excellent improvement and very pleased with where things stand in that regard. Fortunately I do not see any evidence of  infection locally or systemically at this time which is great news. No fevers, chills, nausea, vomiting, or diarrhea. 09-24-2022 upon evaluation today patient appears to be doing well currently in regard to his wounds. Things look to be doing quite well. 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 He is seeing his dermatologist tomorrow in that regard. With that being said other than that however he seems to really be making some pretty good progress here which is good news. No fevers, chills, nausea, vomiting, or diarrhea. 12/26; the patient has 2 open wounds remaining on the left leg. One is on the left anterior mid tibia and the other is on the right lateral knee just outside of the popliteal fossa. 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 The patient has been to see dermatology Dr. Adolphus Birchwood who apparently is making him [name] referral to the [facility] Mohs surgery center. He does not yet have an appointment 10-12-2022 upon evaluation today patient appears to be doing well currently in regard to his wound. He has been tolerating the dressing changes without complication and overall feel like we are headed in the right direction. Fortunately I do not see any signs of infection locally or systemically at this time which is great news. No fevers, chills, nausea, vomiting, or diarrhea. 10-23-2022 upon evaluation today patient appears to be doing well currently in regard to his wound. He has been tolerating the dressing changes without BELDON, SHAAK ([id number])  [id number] Page 3 of 10 complication. Fortunately there does not appear to be any signs of active infection locally nor systemically which is great news and overall I am extremely pleased with where we stand currently. No fevers, chills, nausea, vomiting, or diarrhea. 10-26-2022 upon evaluation today patient appears to be doing well currently in regard to his wounds. Everything is showing signs of improvement and this is great news. Fortunately I see no evidence of active infection systemically. He does seem to be doing much better in regard to the local infection in regard to his leg. The smell is also greatly improved. Overall I am extremely happy with where we stand today. This is after just [name] few days with the antibiotic on board. 11-05-2022 upon evaluation today patient appears to be doing well currently in regard to his wounds although the wound where they performed the Mohs surgery does look [name] little bit hyper granulated I think switching to [facility] may be better for him. He voiced understanding. Fortunately there does not appear to be any evidence of active infection locally nor systemically at this time. 11-12-2022 upon evaluation today patient appears to be doing better in regard to both wounds he has been tolerating the dressing changes without complication. There is no signs of infection and in general I think you are doing quite well. No fevers, chills, nausea, vomiting, or diarrhea. 11-20-2022 upon evaluation today patient appears to be doing well currently in regard to his wounds. He has been tolerating the dressing changes without complication. Fortunately there does not appear to be any signs of infection at this time. No fevers, chills, nausea, vomiting, or diarrhea. 11-27-2022 upon evaluation today patient appears to be doing somewhat poorly in regard to his leg in general he has [name] lot of areas where he looks like he had some spots that popped up. There  with  regard to new possible blisters. In general I am actually very concerned about the fact that the wrap may be causing some irritation here. I think that we can try to not do the wrap for 1 week, and given the prescription for mupirocin ointment which I will send into the pharmacy for him. 12-04-2022 upon evaluation today patient appears to be doing well currently in regard to his wounds in fact he appears to be pretty much completely healed based on what I am seeing at this point. I do not see any signs of active infection locally nor systemically at this time which is great news. No fevers, chills, nausea, vomiting, or diarrhea. 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 I discussed that with him today he is aware of the situation.-2024 upon evaluation patient presents for readmission here in the clinic actually last saw him December 04, 2022. With that being said previously his dermatologist had asked that if there was something the need to be addressed from [name] dermatology standpoint and specifically biopsy that we make referral back to them to allow them to do it which I am definitely happy to do. 01-31-2023 upon evaluation today patient appears to be doing well currently in regard to his wound in fact this looks better he did see Dr. Adolphus Birchwood his dermatologist yesterday and they did perform [name] biopsy. With that being said he actually looks like he is doing much better Dr. Adolphus Birchwood feels like this may not be [name] cancerous lesion which will be very good news at the same time I definitely wanted to make sure especially considering his history and the way this looks when we saw him last week. Nonetheless I am extremely pleased with the fact that he is doing so much better at this point. 02-07-2023 upon evaluation today patient appears to be doing well  currently in regard to his wound from the standpoint of size it has not gotten any larger. With that being said he does have some issues here still with what appears to be potentially some infection. Wound does not look quite as good as it did last week. We are still waiting on the results for the pathology. I did put [name] call into dermatology but I have not heard anything back from them as of yet. 02-14-2023 upon evaluation today patient appears to be doing well with regard to his wound infection is definitely under control and looks much better. Fortunately I do not see any signs of systemic infection with anyone locally I think this is improved greatly. 02-19-2023 upon evaluation today patient appears to be doing well currently in regard to his wound which is actually measuring smaller and looking much better. Fortunately I do not see any evidence of active infection locally nor systemically which is great news and overall I am extremely pleased with where we stand today. 02-26-2023 upon evaluation today patient appears to be doing well currently in regard to his wound. He is actually been tolerating the dressing changes without complication. Fortunately there does not appear to be any signs of active infection locally nor systemically which is great news and overall I am extremely pleased with where we stand currently. 03-12-2023 upon evaluation today patient's wound actually showed signs of excellent improvement. I am very pleased with where we stand I do believe that we are  making good progress here. I do not see any signs of active infection. 03-19-2023 upon evaluation today patient actually appears to be making excellent progress in regard to his leg and getting this closed. In fact is just [name] very small area that is actually open at this point. I am actually very pleased with what we are seeing today. 03-26-2023 upon evaluation today patient appears to be doing well currently in regard to his leg which  is actually showing signs of being completely healed. Fortunately I do not see any signs of active infection locally or systemically which is great news and in general I do believe that we are moving in the right direction here. Readmission: 05-16-2023 upon evaluation today patient presents for reevaluation here in the clinic concerning [name] wound on the left anterior/lateral lower extremity. This is similar to where the wound was last time I saw him but not exactly the same. Fortunately there does not appear to be any signs of active infection at this time which is great news. The patient's past medical history really has not changed significantly since last time I saw him. 05-24-2023 upon evaluation patient actually appears to be doing excellent in regard to his leg ulcer. He has been tolerating the dressing changes without complication in general I do feel like there were making excellent headway towards complete closure. I do not see any signs of active infection at this point. 8/29; left lateral lower leg in the setting of chronic venous insufficiency. We have been using Hydrofera Blue under Urgo K2. Wounds are making nice improvements Electronic Signature(s) Signed: 06/06/2023 5:20:34 PM By: [name] Najjar MD Entered By: [name] [name] on 06/06/2023 10:08:41 JOSECARLOS, WHALLEY [name] ([id number]) [id number] Page 4 of 10 -------------------------------------------------------------------------------- Physical Exam Details Patient Name: Date of Service: [name] 06/06/2023 12:30 PM Medical Record Number: [id number] Patient Account Number: [id number] Date of Birth/Sex: Treating RN: 1945/11/27 (77 y.o. [name]) Yevonne Pax Primary Care Provider: [name] Other Clinician: Referring Provider: Treating Provider/Extender: RO BSO N, MICHA EL Shanda Bumps in Treatment: 3 Constitutional Patient is hypertensive.. Pulse regular and within target range for  patient.Marland Kitchen Respirations regular, non-labored and within target range.. Temperature is normal and within the target range for the patient.Marland Kitchen appears in no distress. Cardiovascular Edema control is good skin changes of chronic venous insufficiency. Notes Wound exam; left lateral mid calf area. Wound is measuring smaller slightly rim of epithelialization under illumination no surface debris is noted. No evidence of surrounding infection. No debridement is necessary Electronic Signature(s) Signed: 06/06/2023 5:20:34 PM By: [name] Najjar MD Entered By: [name] [name] on 06/06/2023 10:09:37 -------------------------------------------------------------------------------- Physician Orders Details Patient Name: Date of Service: [name], [name] 06/06/2023 12:30 PM Medical Record Number: [id number] Patient Account Number: [id number] Date of Birth/Sex: Treating RN: 1946/01/24 (77 y.o. [name] Primary Care Provider: [name] Other Clinician: Referring Provider: Treating Provider/Extender: RO BSO Dorris Carnes, MICHA EL Shanda Bumps in Treatment: 3 Verbal / Phone Orders: No Diagnosis Coding Follow-up Appointments Return Appointment in 1 week. Bathing/ Shower/ Hygiene May shower; gently cleanse wound with antibacterial soap, rinse and pat dry prior to dressing wounds Edema Control - Lymphedema / Segmental Compressive Device / Other Patient to wear own compression stockings. Remove compression stockings every night before going to bed and put on every morning when getting up. - right Elevate, Exercise Daily and [name] void Standing for Long Periods of Time. Elevate legs to the level of the heart and  pump ankles as often as possible Elevate leg(s) parallel to the floor when sitting. Wound Treatment ADITYA, SZALKOWSKI ([id number]) [id number] Page 5 of 10 Wound #21 - Lower Leg Wound Laterality: Left, Lateral Cleanser: Soap and Water 2 x Per Week/30 Days Discharge  Instructions: Gently cleanse wound with antibacterial soap, rinse and pat dry prior to dressing wounds Cleanser: Wound Cleanser 2 x Per Week/30 Days Discharge Instructions: Wash your hands with soap and water. Remove old dressing, discard into plastic bag and place into trash. Cleanse the wound with Wound Cleanser prior to applying [name] clean dressing using gauze sponges, not tissues or cotton balls. Do not scrub or use excessive force. Pat dry using gauze sponges, not tissue or cotton balls. Peri-Wound Care: AandD Ointment 2 x Per Week/30 Days Discharge Instructions: Apply AandD Ointment as directed Prim Dressing: Hydrofera Blue Ready Transfer Foam, 2.5x2.5 (in/in) 2 x Per Week/30 Days ary Discharge Instructions: Apply Hydrofera Blue Ready to wound bed as directed Secondary Dressing: Zetuvit Plus 4x8 (in/in) 2 x Per Week/30 Days Compression Wrap: Urgo K2, two layer compression system, regular 2 x Per Week/30 Days Electronic Signature(s) Signed: 06/06/2023 5:20:34 PM By: [name] Najjar MD Signed: 06/07/2023 11:58:03 AM By: Yevonne Pax RN Entered By: Yevonne Pax on 06/06/2023 10:02:39 -------------------------------------------------------------------------------- Problem List Details Patient Name: Date of Service: [name], [name] 06/06/2023 12:30 PM Medical Record Number: [id number] Patient Account Number: [id number] Date of Birth/Sex: Treating RN: [birth year] (77 y.o. 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 of 05/16/2023 No Yes bilateral lower extremity L97.822 Non-pressure chronic ulcer of other part of left lower leg with fat layer  exposed8/05/2023 No Yes I73.89 Other specified peripheral vascular diseases 05/16/2023 No Yes I48.0 Paroxysmal atrial fibrillation 05/16/2023 No Yes I10 Essential (primary) hypertension 05/16/2023 No Yes ROOSEVELT, [name] ([id number]) [id number] Page 6 of 10 Inactive Problems Resolved Problems Electronic Signature(s) Signed: 06/06/2023 5:20:34 PM By: [name] Najjar MD Entered By: [name] [name] on 06/06/2023 10:07:57 -------------------------------------------------------------------------------- Progress Note Details Patient Name: Date of Service: [name], [name] 06/06/2023 12:30 PM Medical Record Number: [id number] Patient Account Number: [id number] Date of Birth/Sex: Treating RN: [birth year] (77 y.o. 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 the patient has had [name] vascular workup over 7 years ago and said everything was normal at that stage. He does not have any chronic problems except for cardiac issues which he sees [name] cardiologist in [place]. 08/15/2017 -- arterial and venous duplex studies still pending. 08/23/2017 -- venous reflux studies done on 08/13/2017 shows venous incompetence throughout the left lower extremity deep system and focally at the left saphenofemoral junction. 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 No evidence of SVT or DVT in bilateral lower  extremities The patient has an appointment at the end of the month to  get his arterial duplex study done 09/05/2017 -- the patient was seen at the vein and vascular office yesterday by Bary Castilla. ABI studies were notable for medial calcification and the toe brachial indices were normal and bilateral ankle-brachial) waveforms were normal with triphasic flow. After review of his venous studies he was not [name] candidate for laser ablation and his lymphedema was to be treated with compression stockings and lymphedema pump pumps 09/12/2017 -- had [name] low arterial study done at the [facility] -- unable to obtain reliable ABI is due to medial calcification. Bilateral toe indices were normal with the right being 1.01 and the left being 0.92 and the waveforms were triphasic bilaterally. he did get hold of 30-40 mm compression stockings but is unable to put these on. We will try and get him alternative compression stockings. 09/26/17- he is here in follow up evaluation of [name] right lower extremity ulcer;he is compliant in wearing compression stocking; ulcer almost epithelialized , anticipate healing next appointment Readmission: 11/17 point upon evaluation patient's wound currently that he is seeing [place] for today is [name] skin cancerous lesion that was cleared away by his dermatologist on the left medial calf region. He tells me that this is [name] very similar thing to what he had done previously in fact the last time he saw him in 2018 this was also what was going on at that point. Nonetheless he feels that based on what he seeing currently that this is just having [name] lot of harder time healing although it is much closer to the surface than what he is experienced in the past. He notes that the initial removal was in June 2022 which was this year this is now November and still has not closed. He does have some edema and definitely I think that there is some venous component to his slow healing here.  Also think that we can do something better than Vaseline to try to help with getting this to clear up as quickly as possible. He does have [name] history of atrial fibrillation and is on Eliquis otherwise he really has no major medical problems that would affect wound healing. 09/07/2021 upon evaluation today patient actually appears to be doing significantly better after having wrapped him last week. Overall I think that this is making significant improvements at this time which is great news. I do not see any evidence of infection which is great news as well. No fevers, chills, nausea, vomiting, or diarrhea. 09/14/2021 upon evaluation today patient appears to be doing well with regard to his leg ulcer. He has been tolerating the dressing changes and overall I think that he is making excellent progress. I do not see any signs of active infection at this time. 09/21/2021 upon evaluation today patient actually appears to be making good progress with regard to his wound this is again measuring smaller today no debridement seems to be necessary. We have been using [name] silver collagen dressing and I think that is doing an awesome job. 09/28/2021 upon evaluation today patient appears to be doing well with regard to his leg currently. I do not see any signs of active infection at this time which is great news. No fevers, chills, nausea, vomiting, or diarrhea. I think this wound is very close to complete resolution. [name], [name] [name] ([id number]) [id number] Page 7 of 10 10/12/2021 upon evaluation today patient actually appears to be doing awesome in regard to his leg ulcer. In fact this appears to be completely  healed based on what I am seeing currently. I do not see any evidence of active infection locally nor systemically at this time which is also great news. No fevers, chills, nausea, vomiting, or diarrhea. Readmission: 12/07/2021 upon evaluation today patient presents for readmission  here in the clinic. He was discharged on 10/12/2021 is completely healed. Unfortunately this has reopened at this point and he is having continual issues with new blisters over both lower extremities. This is even worse than what we previously saw. Nonetheless we did actually check his ABIs today and it did reveal that his ABIs were 0.55 on the left and 0.57 on the right. Subsequently this is [name] definite change from his last arterial study which showed that he did have good blood flow at 1.01 on the right and 0.92 on the left and that was right at the beginning of 2019. Nonetheless based on what we see currently I do think he tolerated the 3 layer compression wrap but I do believe that we probably need to get him tested for his arterial flow in order to see where things stand and if there is something we can do there that would help prevent this from continue to be an ongoing issue. He did not utilize compression socks in the interim from when he was last here till this time. That something is probably going to need lifelong going forward as well. 3/9; patient presents for follow-up. He has no issues or complaints today. He tolerated the compression wrap well. He had ABIs with TBI's done. He denies signs of infection. 12/21/2021 upon evaluation today patient appears to be doing well with regard to the wounds on his legs. Both are showing signs of significant improvement which is great news although I do believe some sharp debridement would be of benefit here as well. 12/28/2021 upon evaluation today patient appears to be doing well with regard to his wounds. Everything is showing signs of excellent improvement which I am very pleased about. I think that we are headed in the right direction here. Fortunately there does not appear to be any evidence of infection which is great news there is [name] little bit of hypergranulation. 01/04/2022 upon evaluation today patient appears to be doing well with regard to his  wounds 2 of them are healed 1 is almost so and the other 1 is significantly better. Overall I am extremely pleased with where we stand and I think that he is making excellent progress here. I do not see any evidence of active infection locally nor systemically at this time. 01-16-2022 upon evaluation today patient's wound on the left leg is showing signs of doing quite well. Has not completely cleared at this point but it is much improved. Fortunately I do not see any signs of infection at this time. No fevers, chills, nausea, vomiting, or diarrhea. 01-23-2022 upon evaluation today patient's wound of the left leg actually appears to be pretty much completely healed which is great news. I do not see any signs of active infection locally or systemically which is excellent. With that being said on the right leg what wound is measuring smaller the other 1 is [name] new wound that just showed up fortunately its not too bad. Has been using Xeroform here and that seems to be doing decently well which is great news. Unfortunately his blood pressure is significantly high we gave him the readings for the past 4-5 visits as well as [name] recommendation to make an appointment to go discuss  this with his primary care provider patient states that he is going to look into doing this. 01-30-2022 upon evaluation today patient appears to be doing well with regard to his left leg everything appears to be healed. On the right leg the more anterior wound is healed the more medial wound that I been concerned about [name] possible skin cancer unfortunately still does not look great to me. I do believe that we should probably do [name] biopsy I have talked about it with him [name] few times I think though it is probably time to go ahead and do this at this point. 02-09-2022 upon evaluation today patient appears to be doing well with regard to his legs. On the left this appears to be completely healed. On the right he does have 2 areas and be perfectly  honest one of them is [name] skin cancer that he is going to the Mohs surgery clinic for the other seems to be healing nicely. Readmission: 08-02-2022 upon evaluation today patient appears for reevaluation here in our clinic concerning issues that he has been having with wounds over the bilateral lower extremities. I last saw him in May 2023 and at that point we had him completely healed. Unfortunately he is tells me this has broken down to some degree since that point. 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 He tells me this 1 seems to just be getting bigger and bigger not improving. Fortunately he is not having any significant pain which is good news he does have quite [name] bit of swelling and he tells me that his fluid pills are not recommended for him to take daily but just in 3-day intervals here and there. 08-09-2022 upon evaluation today patient appears to be doing still somewhat poorly in regard to his legs although in general he does not appear to be feeling as good as he has been. Fortunately there does not appear to be any signs of infection which is good news. With that being said he is having some issues here with having and overall poor feeling in general which again is good I think going to be the biggest complicating factor. He actually seems to be coughing I do not hear any wheezing right now I did listen to his chest he did not have good airflow down low however makes me suspicious for bronchitis or even possibly pneumonia which could be part of what is going on here as well. Fortunately I do not see any evidence of anything worsening in regard to his legs but I definitely believe that he needs to continue with the compression wraps he took them off yesterday to shower has not had anything on  for 24 hours that is why his legs are so swollen today. With regard to his pathology report I did review that it showed some squamous abnormality but no signs of distinct carcinoma. With that being said it was saying that it could be adjacent to [name] squamous cell carcinoma nonetheless my suggestion is can be that we have the patient take copy of this report and give it to his Mohs surgeon in order for them to see if there is anything they feel like needs to be done further. With that being said right now I feel like the primary  thing is going to be for [place] to try to get his swelling down and keep that down into that hand since he is having so much drainage I believe we can have to bring him in for dressing changes twice [name] week doing [name] nurse visit on Mondays. 11/9; since the patient was last here he spent the night in the emergency room he received IV Lasix. Also received antibiotics although he was not discharged on either 1 of these. He also saw his cardiology office who put him on regular Lasix 20 mg [previously on as needed Lasix 20 mg]. Per our intake nurse the swelling in his legs is remarkably better but he still has bilateral lower extremity wounds. He still has wounds on the bilateral lower extremities most problematically on the left lateral calf. He has been using silver alginate under 3 layer compression. 08-23-2022 upon evaluation today patient appears to be doing much better than the last time I saw him 2 weeks ago. At that point I was very concerned about how he was doing he did see Dr. Sherrie Mustache his primary care provider they got him on some blood pressure medication in general his color and overall appearance looks to be doing much improved compared to the last time I saw him. 09-04-2022 upon evaluation today patient appears to be doing well currently in regard to his wounds. Everything is showing signs of improvement which is great news. Fortunately there does not appear to be any signs of  active infection locally or systemically at this time. No fevers, chills, nausea, vomiting, or diarrhea. 09-10-2022 upon evaluation today patient appears to be doing better in regard to his wounds although the [facility] was extremely stuck to the wound bed. Fortunately there does not appear to be any signs of infection locally or systemically at this time which is great news. No fevers, chills, nausea, vomiting, or diarrhea. 09-17-2022 upon evaluation today patient appears to be doing well currently in regard to his wounds in general. The right leg actually showing signs of excellent improvement and very pleased with where things stand in that regard. Fortunately I do not see any evidence of infection locally or systemically at this time which is great news. No fevers, chills, nausea, vomiting, or diarrhea. 09-24-2022 upon evaluation today patient appears to be doing well currently in regard to his wounds. Things look to be doing quite well. 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 He is seeing his dermatologist tomorrow in that regard. With that being said other than that however he seems to really be making some pretty good progress here which is good news. No fevers, chills, nausea, vomiting, or diarrhea. 12/26; the patient has 2 open wounds remaining on the left leg. One is on the left anterior mid tibia and the other is on the right lateral knee just outside of the popliteal fossa. 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 The patient has been to see dermatology Dr. Adolphus Birchwood who apparently is making him [name] referral to the [facility]. He does not yet have an appointment 10-12-2022 upon evaluation today patient appears to be doing  well currently in regard to his wound. He has been tolerating the dressing changes without complication and overall feel like we are headed  in the right direction. Fortunately I do not see any signs of infection locally or systemically at this time which is great news. No fevers, chills, nausea, vomiting, or diarrhea. 10-23-2022 upon evaluation today patient appears to be doing well currently in regard to his wound. He has been tolerating the dressing changes without complication. Fortunately there does not appear to be any signs of active infection locally nor systemically which is great news and overall I am extremely pleased with where we stand currently. No fevers, chills, nausea, vomiting, or diarrhea. 10-26-2022 upon evaluation today patient appears to be doing well currently in regard to his wounds. Everything is showing signs of improvement and this is great news. Fortunately I see no evidence of active infection systemically. He does seem to be doing much better in regard to the local infection in regard to his leg. The smell is also greatly improved. Overall I am extremely happy with where we stand today. This is after just [name] few days with the antibiotic on board. 11-05-2022 upon evaluation today patient appears to be doing well currently in regard to his wounds although the wound where they performed the Mohs surgery does look [name] little bit hyper granulated I think switching to [facility] may be better for him. He voiced understanding. Fortunately there does not appear to be any evidence of active infection locally nor systemically at this time. 11-12-2022 upon evaluation today patient appears to be doing better in regard to both wounds he has been tolerating the dressing changes without complication. There is no signs of infection and in general I think you are doing quite well. No fevers, chills, nausea, vomiting, or diarrhea. 11-20-2022 upon evaluation today patient appears to be doing well  currently in regard to his wounds. He has been tolerating the dressing changes without complication. Fortunately there does not appear to be any signs of infection at this time. No fevers, chills, nausea, vomiting, or diarrhea. 11-27-2022 upon evaluation today patient appears to be doing somewhat poorly in regard to his leg in general he has [name] lot of areas where he looks like he had some spots that popped up. There with regard to new possible blisters. In general I am actually very concerned about the fact that the wrap may be causing some irritation here. I think that we can try to not do the wrap for 1 week, and given the prescription for mupirocin ointment which I will send into the pharmacy for him. 12-04-2022 upon evaluation today patient appears to be doing well currently in regard to his wounds in fact he appears to be pretty much completely healed based on what I am seeing at this point. I do not see any signs of active infection locally nor systemically at this time which is great news. No fevers, chills, nausea, vomiting, or diarrhea. 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 I discussed that with him today he is aware of the situation.-2024 upon evaluation patient presents for readmission here in the clinic actually last saw him December 04, 2022. With that being said previously his dermatologist had asked that if there was something the need to be addressed from [name] dermatology standpoint and specifically biopsy that we make referral back to them to allow them to do it which I am definitely happy to do. 01-31-2023 upon evaluation today  patient appears to be doing well currently in regard to his wound in fact this looks better he did see Dr. Adolphus Birchwood his dermatologist yesterday and they did perform [name] biopsy. With that being said he actually  looks like he is doing much better Dr. Adolphus Birchwood feels like this may not be [name] cancerous lesion which will be very good news at the same time I definitely wanted to make sure especially considering his history and the way this looks when we saw him last week. Nonetheless I am extremely pleased with the fact that he is doing so much better at this point. 02-07-2023 upon evaluation today patient appears to be doing well currently in regard to his wound from the standpoint of size it has not gotten any larger. With that being said he does have some issues here still with what appears to be potentially some infection. Wound does not look quite as good as it did last week. We are still waiting on the results for the pathology. I did put [name] call into dermatology but I have not heard anything back from them as of yet. 02-14-2023 upon evaluation today patient appears to be doing well with regard to his wound infection is definitely under control and looks much better. Fortunately I do not see any signs of systemic infection with anyone locally I think this is improved greatly. 02-19-2023 upon evaluation today patient appears to be doing well currently in regard to his wound which is actually measuring smaller and looking much better. Fortunately I do not see any evidence of active infection locally nor systemically which is great news and overall I am extremely pleased with where we stand today. 02-26-2023 upon evaluation today patient appears to be doing well currently in regard to his wound. He is actually been tolerating the dressing changes without complication. Fortunately there does not appear to be any signs of active infection locally nor systemically which is great news and overall I am extremely pleased with where we stand currently. 03-12-2023 upon evaluation today patient's wound actually showed signs of excellent improvement. I am very pleased with where we stand I do believe that we are making good progress  here. I do not see any signs of active infection. 03-19-2023 upon evaluation today patient actually appears to be making excellent progress in regard to his leg and getting this closed. In fact is just [name] very small area that is actually open at this point. I am actually very pleased with what we are seeing today. 03-26-2023 upon evaluation today patient appears to be doing well currently in regard to his leg which is actually showing signs of being completely healed. Fortunately I do not see any signs of active infection locally or systemically which is great news and in general I do believe that we are moving in the right direction here. Readmission: 05-16-2023 upon evaluation today patient presents for reevaluation here in the clinic concerning [name] wound on the left anterior/lateral lower extremity. This is similar to where the wound was last time I saw him but not exactly the same. Fortunately there does not appear to be any signs of active infection at this time which is great news. The patient's past medical history really has not changed significantly since last time I saw him. 05-24-2023 upon evaluation patient actually appears to be doing excellent in regard to his leg ulcer. He has been tolerating the dressing changes without complication in general I do feel like there were making excellent headway towards  complete closure. I do not see any signs of active infection at this point. 8/29; left lateral lower leg in the setting of chronic venous insufficiency. We have been using Hydrofera Blue under Urgo K2. Wounds are making nice improvements [name], [name] ([id number]) [id number] Page 9 of 10 Objective Constitutional Patient is hypertensive.. Pulse regular and within target range for patient.Marland Kitchen Respirations regular, non-labored and within target range.. Temperature is normal and within the target range for the patient.Marland Kitchen appears in no distress. Vitals Time Taken: 12:36  PM, Height: 75 in, Weight: 220 lbs, BMI: 27.5, Temperature: 98.1 F, Pulse: 76 bpm, Respiratory Rate: 18 breaths/min, Blood Pressure: 189/92 mmHg. Cardiovascular Edema control is good skin changes of chronic venous insufficiency. General Notes: Wound exam; left lateral mid calf area. Wound is measuring smaller slightly rim of epithelialization under illumination no surface debris is noted. No evidence of surrounding infection. No debridement is necessary Integumentary (Hair, Skin) Wound #21 status is Open. Original cause of wound was Gradually Appeared. The date acquired was: 04/08/2023. The wound has been in treatment 3 weeks. The wound is located on the Left,Lateral Lower Leg. The wound measures 3.5cm length x 2.2cm width x 0.1cm depth; 6.048cm^2 area and 0.605cm^3 volume. There is Fat Layer (Subcutaneous Tissue) exposed. There is no tunneling or undermining noted. There is [name] medium amount of serosanguineous drainage noted. There is small (1-33%) red granulation within the wound bed. There is [name] large (67-100%) amount of necrotic tissue within the wound bed. 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 There was [name] Double Layer Compression Therapy Procedure by Yevonne Pax, RN. Post procedure Diagnosis Wound #21: Same as Pre-Procedure Plan Follow-up Appointments: Return Appointment in 1 week. Bathing/ Shower/ Hygiene: May shower; gently cleanse wound with antibacterial soap, rinse and pat dry prior to dressing wounds Edema Control - Lymphedema / Segmental Compressive  Device / Other: Patient to wear own compression stockings. Remove compression stockings every night before going to bed and put on every morning when getting up. - right Elevate, Exercise Daily and Avoid Standing for Long Periods of Time. Elevate legs to the level of the heart and pump ankles as often as possible Elevate leg(s) parallel to the floor when sitting. WOUND #21: - Lower Leg Wound Laterality: Left, Lateral Cleanser: Soap and Water 2 x Per Week/30 Days Discharge Instructions: Gently cleanse wound with antibacterial soap, rinse and pat dry prior to dressing wounds Cleanser: Wound Cleanser 2 x Per Week/30 Days Discharge Instructions: Wash your hands with soap and water. Remove old dressing, discard into plastic bag and place into trash. Cleanse the wound with Wound Cleanser prior to applying [name] clean dressing using gauze sponges, not tissues or cotton balls. Do not scrub or use excessive force. Pat dry using gauze sponges, not tissue or cotton balls. Peri-Wound Care: AandD Ointment 2 x Per Week/30 Days Discharge Instructions: Apply AandD Ointment as directed Prim Dressing: Hydrofera Blue Ready Transfer Foam, 2.5x2.5 (in/in) 2 x Per Week/30 Days ary Discharge Instructions: Apply Hydrofera Blue Ready to wound bed as directed Secondary Dressing: Zetuvit Plus 4x8 (in/in) 2 x Per Week/30 Days Com pression Wrap: Urgo K2, two layer compression system, regular 2 x Per  Week/30 Days 1. Patient is doing well. No reason to change the dressing. Sizable improvement in the wound circumference at 1 cm [name], [name] ([id number]) [id number] Page 10 of 10 2. Hydrofera Blue under compression 3. The patient has compression stockings at home in the eventuality that this closes which should be in the next 3 or 4 weeks Electronic Signature(s) Signed: 06/06/2023 5:20:34 PM By: [name] Najjar MD Entered By: [name] [name] on 06/06/2023  10:11:05 -------------------------------------------------------------------------------- SuperBill Details Patient Name: Date of Service: [name] 06/06/2023 Medical Record Number: [id number] Patient Account Number: [id number] Date of Birth/Sex: Treating RN: 07/31/1946 (77 y.o. [name]) Yevonne Pax Primary Care Provider: [name] Other Clinician: Referring Provider: Treating Provider/Extender: RO BSO N, MICHA EL Shanda Bumps in Treatment: 3 Diagnosis Coding ICD-10 Codes Code Description I89.0 Lymphedema, not elsewhere classified 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 L97.822 Non-pressure chronic ulcer of other part of left lower leg with fat layer exposed I73.89 Other specified peripheral vascular diseases I48.0 Paroxysmal atrial fibrillation I10 Essential (primary) hypertension Facility Procedures : CPT4 Code: [id number] Description: (Facility Use Only) [phone] - APPLY MULTLAY COMPRS LWR LT LEG Modifier: Quantity: 1 Physician Procedures : CPT4 Code Description Modifier [id number] - WC PHYS LEVEL 3 - EST PT ICD-10 Diagnosis Description L97.822 Non-pressure chronic ulcer of other part of left lower leg with fat layer exposed I89.0 Lymphedema, not elsewhere classified I87.333 Chronic  venous hypertension (idiopathic) with ulcer and inflammation of bilateral lower extremity Quantity: 1 Electronic Signature(s) Signed: 06/06/2023 5:20:34 PM By: [name] Najjar MD Entered By: [name] [name] on 06/06/2023 10:11:25

## 2023-06-07 NOTE — Progress Notes (Signed)
[name], [name] ([id number]) [id number] Page 1 of 9 Visit Report for 06/06/2023 Arrival Information Details Patient Name: Date of Service: [name] 06/06/2023 12:30 PM Medical Record Number: [id number] Patient Account Number: [id number] Date of Birth/Sex: Treating RN: [birth year] (77 y.o. [name]) Yevonne Pax Primary Care Clarke Amburn: [name] Other Clinician: Referring Regis Wiland: Treating Ashlee Bewley/Extender: RO BSO Dorris Carnes, MICHA EL Shanda Bumps in Treatment: 3 Visit Information History Since Last Visit Added or deleted any medications: No Patient Arrived: Ambulatory Any new allergies or adverse reactions: No Arrival Time: 12:36 Had a fall or experienced change in No Accompanied By: self activities of daily living that may affect Transfer Assistance: None risk of falls: Patient Identification Verified: Yes Signs or symptoms of abuse/neglect since last visito No Secondary Verification Process Completed: Yes Hospitalized since last visit: No Patient Requires Transmission-Based Precautions: No Implantable device outside of the clinic excluding No Patient Has Alerts: No cellular tissue based products placed in the center since last visit: Has Dressing in Place as Prescribed: Yes Has Compression in Place as Prescribed: Yes Pain Present Now: No Electronic Signature(s) Signed: 06/07/2023 11:58:03 AM By: Yevonne Pax RN Entered By: Yevonne Pax on 06/06/2023 09:36:50 -------------------------------------------------------------------------------- Clinic Level of Care Assessment Details Patient Name: Date of Service: [name] 06/06/2023 12:30 PM Medical Record Number: [id number] Patient Account Number: [id number] Date of Birth/Sex: Treating RN: 09-15-1946 (77 y.o. [name]) Yevonne Pax Primary Care Ziquan Fidel: [name] Other Clinician: Referring Ivis Nicolson: Treating Kodee Drury/Extender: RO BSO Dorris Carnes, MICHA EL Shanda Bumps in  Treatment: 3 Clinic Level of Care Assessment Items TOOL 1 Quantity Score []  - 0 Use when EandM and Procedure is performed on INITIAL visit ASSESSMENTS - Nursing Assessment / Reassessment []  - 0 General Physical Exam (combine w/ comprehensive assessment (listed just below) when performed on new pt. evals) []  - 0 Comprehensive Assessment (HX, ROS, Risk Assessments, Wounds Hx, etc.) [name] ([id number]) [id number] Page 2 of 9 ASSESSMENTS - Wound and Skin Assessment / Reassessment []  - 0 Dermatologic / Skin Assessment (not related to wound area) ASSESSMENTS - Ostomy and/or Continence Assessment and Care []  - 0 Incontinence Assessment and Management []  - 0 Ostomy Care Assessment and Management (repouching, etc.) PROCESS - Coordination of Care []  - 0 Simple Patient / Family Education for ongoing care []  - 0 Complex (extensive) Patient / Family Education for ongoing care []  - 0 Staff obtains Chiropractor, Records, T Results / Process Orders est []  - 0 Staff telephones HHA, Nursing Homes / Clarify orders / etc []  - 0 Routine Transfer to another Facility (non-emergent condition) []  - 0 Routine Hospital Admission (non-emergent condition) []  - 0 New Admissions / Manufacturing engineer / Ordering NPWT Apligraf, etc. , []  - 0 Emergency Hospital Admission (emergent condition) PROCESS - Special Needs []  - 0 Pediatric / Minor Patient Management []  - 0 Isolation Patient Management []  - 0 Hearing / Language / Visual special needs []  - 0 Assessment of Community assistance (transportation, [name], etc.) []  - 0 Additional assistance / Altered mentation []  - 0 Support Surface(s) Assessment (bed, cushion, seat, etc.) INTERVENTIONS - Miscellaneous []  - 0 External ear exam []  - 0 Patient Transfer (multiple staff / Nurse, adult / Similar devices) []  - 0 Simple Staple / Suture removal (25 or less) []  - 0 Complex Staple / Suture removal (26 or  more) []  - 0 Hypo/Hyperglycemic Management (do not check if billed separately) []  - 0 Ankle / Brachial Index (ABI) -  do not check if billed separately Has the patient been seen at the hospital within the last three years: Yes Total Score: 0 Level Of Care: ____ Electronic Signature(s) Signed: 06/07/2023 11:58:03 AM By: Yevonne Pax RN Entered By: Yevonne Pax on 06/06/2023 10:02:53 -------------------------------------------------------------------------------- Compression Therapy Details Patient Name: Date of Service: [name] 06/06/2023 12:30 PM Medical Record Number: [id number] Patient Account Number: [id number] Date of Birth/Sex: Treating RN: 1946/03/10 (77 y.o. [name] Primary Care Ashley Bultema: [name] Other Clinician: Referring Nedim Oki: Treating Cale Bethard/Extender: RO Larwance Rote, MICHA EL Quinterious, Gearlds, [name] ([id number]) [phone].pdf Page 3 of 9 Weeks in Treatment: 3 Compression Therapy Performed for Wound Assessment: Wound #21 Left,Lateral Lower Leg Performed By: Clinician Yevonne Pax, RN Compression Type: Double Layer Post Procedure Diagnosis Same as Pre-procedure Electronic Signature(s) Signed: 06/07/2023 11:58:03 AM By: Yevonne Pax RN Entered By: Yevonne Pax on 06/06/2023 10:01:39 -------------------------------------------------------------------------------- Encounter Discharge Information Details Patient Name: Date of Service: [name] 06/06/2023 12:30 PM Medical Record Number: [id number] Patient Account Number: [id number] Date of Birth/Sex: Treating RN: [birth year] (77 y.o. [name] Primary Care Ky Moskowitz: [name] Other Clinician: Referring Alaze Garverick: Treating Jazzlyn Huizenga/Extender: RO BSO Dorris Carnes, MICHA EL Shanda Bumps in Treatment: 3 Encounter Discharge Information Items Discharge Condition: Stable Ambulatory Status: Ambulatory Discharge Destination: Home Transportation: Private  Auto Accompanied By: self Schedule Follow-up Appointment: Yes Clinical Summary of Care: Electronic Signature(s) Signed: 06/07/2023 11:58:03 AM By: Yevonne Pax RN Entered By: Yevonne Pax on 06/06/2023 10:03:54 -------------------------------------------------------------------------------- Lower Extremity Assessment Details Patient Name: Date of Service: [name] 06/06/2023 12:30 PM Medical Record Number: [id number] Patient Account Number: [id number] Date of Birth/Sex: Treating RN: 01/19/1946 (77 y.o. [name] Primary Care Cieanna Stormes: [name] Other Clinician: Referring Fani Rotondo: Treating Shahin Knierim/Extender: RO BSO N, MICHA EL Shanda Bumps in Treatment: 3 Edema Assessment M[Left: CHAEL, DEVILLIER [name] [id number] [Right: [id number] Page 4 of 9] Assessed: [Left: No] [Right: No] Edema: [Left: Ye] [Right: s] Calf Left: Right: Point of Measurement: 37 cm From Medial Instep 34 cm Ankle Left: Right: Point of Measurement: 12 cm From Medial Instep 23 cm Knee To Floor Left: Right: From Medial Instep 51 cm Vascular Assessment Pulses: Dorsalis Pedis Palpable: [Left:Yes] Extremity colors, hair growth, and conditions: Extremity Color: [Left:Hyperpigmented] Hair Growth on Extremity: [Left:No] Temperature of Extremity: [Left:Warm] Capillary Refill: [Left:< 3 seconds] Dependent Rubor: [Left:No] Blanched when Elevated: [Left:No No] Toe Nail Assessment Left: Right: Thick: No Discolored: No Deformed: No Improper Length and Hygiene: No Electronic Signature(s) Signed: 06/07/2023 11:58:03 AM By: Yevonne Pax RN Entered By: Yevonne Pax on 06/06/2023 09:54:30 -------------------------------------------------------------------------------- Multi Wound Chart Details Patient Name: Date of Service: [name] [name], [name] 06/06/2023 12:30 PM Medical Record Number: [id number] Patient Account Number: [id number] Date of Birth/Sex: Treating  RN: 03/01/1946 (77 y.o. [name]) Yevonne Pax Primary Care Alvera Tourigny: [name] Other Clinician: Referring Roy Tokarz: Treating Lakeshia Dohner/Extender: RO BSO N, MICHA EL Shanda Bumps in Treatment: 3 Vital Signs Height(in): 75 Pulse(bpm): 76 Weight(lbs): 220 Blood Pressure(mmHg): 189/92 Body Mass Index(BMI): 27.5 Temperature(F): 98.1 Respiratory Rate(breaths/min): 18 [21:Photos:] IBROHIM, BUCCIERI [name] ([id number]) [21:Photos:] [[name]:[name]] Left, Lateral Lower Leg [name] [name] Wound Location: Gradually Appeared [name] [name] Wounding Event: Vasculitis [name] [name] Primary Etiology: Arrhythmia, Hypertension, Gout [name] [name] Comorbid History: 04/08/2023 [name] [name] Date Acquired: 3 [name] [name] Weeks of Treatment: Open [name] [name] Wound Status: No [name] [name] Wound Recurrence: 3.5x2.2x0.1 [name] [name] Measurements L x W x D (cm)  6.048 [name] [name] A (cm) : rea 0.605 [name] [name] Volume (cm) : 56.00% [name] [name] % Reduction in A rea: 56.00% [name] [name] % Reduction in Volume: Full Thickness Without Exposed [name] [name] Classification: Support Structures Medium [name] [name] Exudate Amount: Serosanguineous [name] [name] Exudate Type: red, brown [name] [name] Exudate Color: Small (1-33%) [name] [name] Granulation Amount: Red [name] [name] Granulation Quality: Large (67-100%) [name] [name] Necrotic Amount: Fat Layer (Subcutaneous Tissue): Yes [name] [name] Exposed Structures: Fascia: No Tendon: No Muscle: No Joint: No Bone: No None [name] [name] Epithelialization: Treatment Notes Electronic Signature(s) Signed: 06/07/2023 11:58:03 AM By: Yevonne Pax RN Entered By: Yevonne Pax on 06/06/2023 09:54:59 -------------------------------------------------------------------------------- Multi-Disciplinary Care Plan Details Patient Name: Date of Service: [name] [name], [name] 06/06/2023 12:30 PM Medical Record Number: [id number] Patient Account Number: [id number] Date of Birth/Sex: Treating RN: 04-11-1946 (77 y.o. [name]) Yevonne Pax Primary Care Daoud Lobue: [name] Other  Clinician: Referring Lakeisha Waldrop: Treating Talma Aguillard/Extender: RO BSO N, MICHA EL Shanda Bumps in Treatment: 3 Active Inactive Wound/Skin Impairment Nursing Diagnoses: Knowledge deficit related to ulceration/compromised skin integrity Goals: Patient/caregiver will verbalize understanding of skin care regimen SAQIB, SUNDE [name] ([id number]) [phone].pdf Page 6 of 9 Date Initiated: 05/16/2023 Target Resolution Date: 06/16/2023 Goal Status: Active Ulcer/skin breakdown will have a volume reduction of 30% by week 4 Date Initiated: 05/16/2023 Target Resolution Date: 06/16/2023 Goal Status: Active Ulcer/skin breakdown will have a volume reduction of 50% by week 8 Date Initiated: 05/16/2023 Target Resolution Date: 07/16/2023 Goal Status: Active Ulcer/skin breakdown will have a volume reduction of 80% by week 12 Date Initiated: 05/16/2023 Target Resolution Date: 08/16/2023 Goal Status: Active Ulcer/skin breakdown will heal within 14 weeks Date Initiated: 05/16/2023 Target Resolution Date: 09/15/2023 Goal Status: Active Interventions: Assess patient/caregiver ability to obtain necessary supplies Assess patient/caregiver ability to perform ulcer/skin care regimen upon admission and as needed Assess ulceration(s) every visit Notes: Electronic Signature(s) Signed: 06/07/2023 11:58:03 AM By: Yevonne Pax RN Entered By: Yevonne Pax on 06/06/2023 09:55:14 -------------------------------------------------------------------------------- Pain Assessment Details Patient Name: Date of Service: [name] 06/06/2023 12:30 PM Medical Record Number: [id number] Patient Account Number: [id number] Date of Birth/Sex: Treating RN: Aug 13, 1946 (77 y.o. [name] Primary Care Madox Corkins: [name] Other Clinician: Referring Denni France: Treating Monifah Freehling/Extender: RO BSO Dorris Carnes, MICHA EL Shanda Bumps in Treatment: 3 Active Problems Location of Pain Severity and  Description of Pain Patient Has Paino No Site Locations Pain Management and Medication Current Pain Management: CULLEY, PLANO ([id number]) [id number].pdf Page 7 of 9 Electronic Signature(s) Signed: 06/07/2023 11:58:03 AM By: Yevonne Pax RN Entered By: Yevonne Pax on 06/06/2023 09:37:17 -------------------------------------------------------------------------------- Patient/Caregiver Education Details Patient Name: Date of Service: [name] 8/29/2024andnbsp12:30 PM Medical Record Number: [id number] Patient Account Number: [id number] Date of Birth/Gender: Treating RN: 25-Mar-1946 (77 y.o. [name]) Yevonne Pax Primary Care Physician: [name] Other Clinician: Referring Physician: Treating Physician/Extender: RO BSO Dorris Carnes, MICHA EL Shanda Bumps in Treatment: 3 Education Assessment Education Provided To: Patient Education Topics Provided Wound/Skin Impairment: Handouts: Caring for Your Ulcer Methods: Explain/Verbal Responses: State content correctly Electronic Signature(s) Signed: 06/07/2023 11:58:03 AM By: Yevonne Pax RN Entered By: Yevonne Pax on 06/06/2023 09:55:25 -------------------------------------------------------------------------------- Wound Assessment Details Patient Name: Date of Service: [name] 06/06/2023 12:30 PM Medical Record Number: [id number] Patient Account Number: [id number] Date of Birth/Sex: Treating RN: [birth year] (77 y.o. [name] Primary Care Lakhia Gengler: [name] Other Clinician: Referring Lyman Balingit: Treating Dmiya Malphrus/Extender: RO BSO N,  MICHA EL Shanda Bumps in Treatment: 3 Wound Status Wound Number: 21 Primary Etiology: Vasculitis Wound Location: Left, Lateral Lower Leg Wound Status: Open Wounding Event: Gradually Appeared Comorbid History: Arrhythmia, Hypertension, Gout Date Acquired: 04/08/2023 [facility] Place Of Treatment: [address] EVERLY, HAIZLIP [name] ([id number])  [phone].pdf Page 8 of 9 Clustered Wound: No Photos Wound Measurements Length: (cm) 3.5 Width: (cm) 2.2 Depth: (cm) 0.1 Area: (cm) 6.048 Volume: (cm) 0.605 % Reduction in Area: 56% % Reduction in Volume: 56% Epithelialization: None Tunneling: No Undermining: No Wound Description Classification: Full Thickness Without Exposed Support Structures Exudate Amount: Medium Exudate Type: Serosanguineous Exudate Color: red, brown Foul Odor After Cleansing: No Slough/Fibrino Yes Wound Bed Granulation Amount: Small (1-33%) Exposed Structure Granulation Quality: Red Fascia Exposed: No Necrotic Amount: Large (67-100%) Fat Layer (Subcutaneous Tissue) Exposed: Yes Tendon Exposed: No Muscle Exposed: No Joint Exposed: No Bone Exposed: No Treatment Notes Wound #21 (Lower Leg) Wound Laterality: Left, Lateral Cleanser Soap and Water Discharge Instruction: Gently cleanse wound with antibacterial soap, rinse and pat dry prior to dressing wounds Wound Cleanser Discharge Instruction: Wash your hands with soap and water. Remove old dressing, discard into plastic bag and place into trash. Cleanse the wound with Wound Cleanser prior to applying a clean dressing using gauze sponges, not tissues or cotton balls. Do not scrub or use excessive force. Pat dry using gauze sponges, not tissue or cotton balls. Peri-Wound Care AandD Ointment Discharge Instruction: Apply AandD Ointment as directed Topical Primary Dressing Hydrofera Blue Ready Transfer Foam, 2.5x2.5 (in/in) Discharge Instruction: Apply Hydrofera Blue Ready to wound bed as directed Secondary Dressing Zetuvit Plus 4x8 (in/in) Secured With Compression Wrap Urgo K2, two layer compression system, regular Compression Stockings Add-Ons Electronic Signature(s) Signed: 06/07/2023 11:58:03 AM By: Yevonne Pax RN Entered By: Yevonne Pax on 06/06/2023 09:53:31 [name] ([id number])  [id number] Page 9 of 9 -------------------------------------------------------------------------------- Vitals Details Patient Name: Date of Service: [name] 06/06/2023 12:30 PM Medical Record Number: [id number] Patient Account Number: [id number] Date of Birth/Sex: Treating RN: May 01, 1946 (77 y.o. [name]) Yevonne Pax Primary Care Johnedward Brodrick: [name] Other Clinician: Referring Gerri Acre: Treating Avelyn Touch/Extender: RO BSO N, MICHA EL Shanda Bumps in Treatment: 3 Vital Signs Time Taken: 12:36 Temperature (F): 98.1 Height (in): 75 Pulse (bpm): 76 Weight (lbs): 220 Respiratory Rate (breaths/min): 18 Body Mass Index (BMI): 27.5 Blood Pressure (mmHg): 189/92 Reference Range: 80 - 120 mg / dl Electronic Signature(s) Signed: 06/07/2023 11:58:03 AM By: Yevonne Pax RN Entered By: Yevonne Pax on 06/06/2023 09:37:08

## 2023-06-13 ENCOUNTER — Encounter: Payer: Medicare PPO | Attending: Physician Assistant

## 2023-06-13 DIAGNOSIS — I428 Other cardiomyopathies: Secondary | ICD-10-CM | POA: Diagnosis not present

## 2023-06-13 DIAGNOSIS — L97822 Non-pressure chronic ulcer of other part of left lower leg with fat layer exposed: Secondary | ICD-10-CM | POA: Diagnosis not present

## 2023-06-13 DIAGNOSIS — I7389 Other specified peripheral vascular diseases: Secondary | ICD-10-CM | POA: Diagnosis not present

## 2023-06-13 DIAGNOSIS — I480 Paroxysmal atrial fibrillation: Secondary | ICD-10-CM | POA: Insufficient documentation

## 2023-06-13 DIAGNOSIS — I890 Lymphedema, not elsewhere classified: Secondary | ICD-10-CM | POA: Diagnosis not present

## 2023-06-13 DIAGNOSIS — Z85828 Personal history of other malignant neoplasm of skin: Secondary | ICD-10-CM | POA: Diagnosis not present

## 2023-06-13 DIAGNOSIS — I87333 Chronic venous hypertension (idiopathic) with ulcer and inflammation of bilateral lower extremity: Secondary | ICD-10-CM | POA: Insufficient documentation

## 2023-06-13 DIAGNOSIS — I10 Essential (primary) hypertension: Secondary | ICD-10-CM | POA: Insufficient documentation

## 2023-06-13 DIAGNOSIS — I482 Chronic atrial fibrillation, unspecified: Secondary | ICD-10-CM | POA: Insufficient documentation

## 2023-06-13 DIAGNOSIS — Z7901 Long term (current) use of anticoagulants: Secondary | ICD-10-CM | POA: Insufficient documentation

## 2023-06-14 NOTE — Progress Notes (Signed)
[name], [name] ([id number]) [id number] Page 1 of 4 Visit Report for 06/13/2023 Arrival Information Details Patient Name: Date of Service: [name] 06/13/2023 11:30 [name] Medical Record Number: [id number] Patient Account Number: [id number] Date of Birth/Sex: Treating RN: [birth year] (77 y.o. [name] Primary Care Evalisse Prajapati: [name] Other Clinician: Betha Loa Referring Jaelen Soth: Treating Classie Weng/Extender: [name] in Treatment: 4 Visit Information History Since Last Visit All ordered tests and consults were completed: No Patient Arrived: Ambulatory Added or deleted any medications: No Arrival Time: 11:37 Any new allergies or adverse reactions: No Transfer Assistance: None Had [name] fall or experienced change in No Patient Identification Verified: Yes activities of daily living that may affect Secondary Verification Process Completed: Yes risk of falls: Patient Requires Transmission-Based Precautions: No Signs or symptoms of abuse/neglect since last visito No Patient Has Alerts: No Hospitalized since last visit: No Implantable device outside of the clinic excluding No cellular tissue based products placed in the center since last visit: Has Dressing in Place as Prescribed: Yes Has Compression in Place as Prescribed: Yes Pain Present Now: No Electronic Signature(s) Signed: 06/13/2023 5:10:42 PM By: Betha Loa Entered By: Betha Loa on 06/13/2023 08:38:39 -------------------------------------------------------------------------------- Clinic Level of Care Assessment Details Patient Name: Date of Service: [name] 06/13/2023 11:30 [name] Medical Record Number: [id number] Patient Account Number: [id number] Date of Birth/Sex: Treating RN: September 15, 1946 (77 y.o. [name] Primary Care Anona Giovannini: [name] Other Clinician: Betha Loa Referring Manjinder Breau: Treating Laurin Paulo/Extender:  [name] in Treatment: 4 Clinic Level of Care Assessment Items TOOL 1 Quantity Score []  - 0 Use when EandM and Procedure is performed on INITIAL visit ASSESSMENTS - Nursing Assessment / Reassessment []  - 0 General Physical Exam (combine w/ comprehensive assessment (listed just below) when performed on new pt. [id number] TOLGA, VOLKMAN [name] [id number] Page 2 of 4 []  - 0 Comprehensive Assessment (HX, ROS, Risk Assessments, Wounds Hx, etc.) ASSESSMENTS - Wound and Skin Assessment / Reassessment []  - 0 Dermatologic / Skin Assessment (not related to wound area) ASSESSMENTS - Ostomy and/or Continence Assessment and Care []  - 0 Incontinence Assessment and Management []  - 0 Ostomy Care Assessment and Management (repouching, etc.) PROCESS - Coordination of Care []  - 0 Simple Patient / Family Education for ongoing care []  - 0 Complex (extensive) Patient / Family Education for ongoing care []  - 0 Staff obtains Chiropractor, Records, T Results / Process Orders est []  - 0 Staff telephones HHA, Nursing Homes / Clarify orders / etc []  - 0 Routine Transfer to another Facility (non-emergent condition) []  - 0 Routine Hospital Admission (non-emergent condition) []  - 0 New Admissions / Manufacturing engineer / Ordering NPWT Apligraf, etc. , []  - 0 Emergency Hospital Admission (emergent condition) PROCESS - Special Needs []  - 0 Pediatric / Minor Patient Management []  - 0 Isolation Patient Management []  - 0 Hearing / Language / Visual special needs []  - 0 Assessment of Community assistance (transportation, [name], etc.) []  - 0 Additional assistance / Altered mentation []  - 0 Support Surface(s) Assessment (bed, cushion, seat, etc.) INTERVENTIONS - Miscellaneous []  - 0 External ear exam []  - 0 Patient Transfer (multiple staff / Nurse, adult / Similar devices) []  - 0 Simple Staple / Suture removal (25 or less) []  -  0 Complex Staple / Suture removal (26 or more) []  - 0 Hypo/Hyperglycemic Management (do not check if billed separately) []  - 0 Ankle / Brachial  Index (ABI) - do not check if billed separately Has the patient been seen at the hospital within the last three years: Yes Total Score: 0 Level Of Care: ____ Electronic Signature(s) Signed: 06/13/2023 5:10:42 PM By: Betha Loa Entered By: Betha Loa on 06/13/2023 09:03:06 -------------------------------------------------------------------------------- Compression Therapy Details Patient Name: Date of Service: [name], [name] 06/13/2023 11:30 [name] Medical Record Number: [id number] Patient Account Number: [id number] Date of Birth/Sex: Treating RN: Feb 28, 1946 (77 y.o. [name] Primary Care Vinnie Gombert: [name] Other Clinician: Kaede, Eisner [name] [id number] Page 3 of 4 Referring Latron Ribas: Treating Tifani Dack/Extender: [name] in Treatment: 4 Compression Therapy Performed for Wound Assessment: Wound #21 Left,Lateral Lower Leg Performed By: Farrel Gordon, Angie, Compression Type: Double Layer Notes patient tolerates wrap well Electronic Signature(s) Signed: 06/13/2023 5:10:42 PM By: Betha Loa Entered By: Betha Loa on 06/13/2023 08:39:39 -------------------------------------------------------------------------------- Encounter Discharge Information Details Patient Name: Date of Service: [name], [name] 06/13/2023 11:30 [name] Medical Record Number: [id number] Patient Account Number: [id number] Date of Birth/Sex: Treating RN: 1946-01-29 (77 y.o. [name] Primary Care Khristen Cheyney: [name] Other Clinician: Betha Loa Referring Adonte Vanriper: Treating Tonique Mendonca/Extender: [name] in Treatment: 4 Encounter Discharge Information Items Discharge Condition: Stable Ambulatory Status:  Ambulatory Discharge Destination: Home Transportation: Private Auto Accompanied By: self Schedule Follow-up Appointment: Yes Clinical Summary of Care: Electronic Signature(s) Signed: 06/13/2023 5:10:42 PM By: Betha Loa Entered By: Betha Loa on 06/13/2023 09:02:54 -------------------------------------------------------------------------------- Wound Assessment Details Patient Name: Date of Service: [name], [name] 06/13/2023 11:30 [name] Medical Record Number: [id number] Patient Account Number: [id number] Date of Birth/Sex: Treating RN: 1946/01/22 (77 y.o. [name] Primary Care Sharell Hilmer: [name] Other Clinician: Betha Loa Referring Horice Carrero: Treating Terriah Reggio/Extender: [name] in Treatment: 4 Wound Status Wound Number: 21 Primary Etiology: Vasculitis [name], [name] ([id number]) [id number] Page 4 of 4 Wound Location: Left, Lateral Lower Leg Wound Status: Open Wounding Event: Gradually Appeared Comorbid History: Arrhythmia, Hypertension, Gout Date Acquired: 04/08/2023 Weeks Of Treatment: 4 Clustered Wound: No Wound Measurements Length: (cm) 3.5 Width: (cm) 2.2 Depth: (cm) 0.1 Area: (cm) 6.048 Volume: (cm) 0.605 % Reduction in Area: 56% % Reduction in Volume: 56% Epithelialization: None Wound Description Classification: Full Thickness Without Exposed Suppor Exudate Amount: Medium Exudate Type: Serosanguineous Exudate Color: red, brown t Structures Foul Odor After Cleansing: No Slough/Fibrino Yes Wound Bed Granulation Amount: Small (1-33%) Exposed Structure Granulation Quality: Red Fascia Exposed: No Necrotic Amount: Large (67-100%) Fat Layer (Subcutaneous Tissue) Exposed: Yes Tendon Exposed: No Muscle Exposed: No Joint Exposed: No Bone Exposed: No Treatment Notes Wound #21 (Lower Leg) Wound Laterality: Left, Lateral Cleanser Soap and Water Discharge Instruction: Gently cleanse  wound with antibacterial soap, rinse and pat dry prior to dressing wounds Wound Cleanser Discharge Instruction: Wash your hands with soap and water. Remove old dressing, discard into plastic bag and place into trash. Cleanse the wound with Wound Cleanser prior to applying [name] clean dressing using gauze sponges, not tissues or cotton balls. Do not scrub or use excessive force. Pat dry using gauze sponges, not tissue or cotton balls. Peri-Wound Care AandD Ointment Discharge Instruction: Apply AandD Ointment as directed Topical Primary Dressing Hydrofera Blue Ready Transfer Foam, 2.5x2.5 (in/in) Discharge Instruction: Apply Hydrofera Blue Ready to wound bed as directed Secondary Dressing Zetuvit Plus 4x8 (in/in) Secured With Compression Wrap Urgo K2, two layer compression system, regular Compression Stockings Add-Ons Electronic Signature(s) Signed:  06/13/2023 4:38:23 PM By: Angelina Pih Signed: 06/13/2023 5:10:42 PM By: Betha Loa Entered By: Betha Loa on 06/13/2023 08:38:57

## 2023-06-14 NOTE — Progress Notes (Signed)
[name], [name] ([id number]) [id number] Page 1 of 2 Visit Report for 06/13/2023 Physician Orders Details Patient Name: Date of Service: [name] 06/13/2023 11:30 A M Medical Record Number: [id number] Patient Account Number: [id number] Date of Birth/Sex: Treating RN: [birth year] (77 y.o. [name] Primary Care Provider: Mila [name] Other Clinician: Betha [name] Referring Provider: Treating Provider/Extender: [name] in Treatment: 4 Verbal / Phone Orders: Yes Clinician: Angelina [name] Read Back and Verified: Yes Diagnosis Coding Follow-up Appointments Return Appointment in 1 week. Bathing/ Shower/ Hygiene May shower; gently cleanse wound with antibacterial soap, rinse and pat dry prior to dressing wounds Edema Control - Lymphedema / Segmental Compressive Device / Other Patient to wear own compression stockings. Remove compression stockings every night before going to bed and put on every morning when getting up. - right Elevate, Exercise Daily and A void Standing for Long Periods of Time. Elevate legs to the level of the heart and pump ankles as often as possible Elevate leg(s) parallel to the floor when sitting. Wound Treatment Wound #21 - Lower Leg Wound Laterality: Left, Lateral Cleanser: Soap and Water 2 x Per Week/30 Days Discharge Instructions: Gently cleanse wound with antibacterial soap, rinse and pat dry prior to dressing wounds Cleanser: Wound Cleanser 2 x Per Week/30 Days Discharge Instructions: Wash your hands with soap and water. Remove old dressing, discard into plastic bag and place into trash. Cleanse the wound with Wound Cleanser prior to applying a clean dressing using gauze sponges, not tissues or cotton balls. Do not scrub or use excessive force. Pat dry using gauze sponges, not tissue or cotton balls. Peri-Wound Care: AandD Ointment 2 x Per Week/30 Days Discharge Instructions: Apply AandD  Ointment as directed Prim Dressing: Hydrofera Blue Ready Transfer Foam, 2.5x2.5 (in/in) 2 x Per Week/30 Days ary Discharge Instructions: Apply Hydrofera Blue Ready to wound bed as directed Secondary Dressing: Zetuvit Plus 4x8 (in/in) 2 x Per Week/30 Days Compression Wrap: Urgo K2, two layer compression system, regular 2 x Per Week/30 Days Electronic Signature(s) Signed: 06/13/2023 5:10:42 PM By: [name] Signed: 06/14/2023 2:02:35 PM By: [name] Derry PA-C Entered By: [name] on 06/13/2023 11:40:06 [name] ([id number]) [id number] Page 2 of 2 -------------------------------------------------------------------------------- SuperBill Details Patient Name: Date of Service: [name] 06/13/2023 Medical Record Number: [id number] Patient Account Number: [id number] Date of Birth/Sex: Treating RN: 1946/02/27 (77 y.o. 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

## 2023-06-20 ENCOUNTER — Encounter: Payer: Medicare PPO | Admitting: Physician Assistant

## 2023-06-20 DIAGNOSIS — I428 Other cardiomyopathies: Secondary | ICD-10-CM | POA: Diagnosis not present

## 2023-06-20 DIAGNOSIS — I7389 Other specified peripheral vascular diseases: Secondary | ICD-10-CM | POA: Diagnosis not present

## 2023-06-20 DIAGNOSIS — L97822 Non-pressure chronic ulcer of other part of left lower leg with fat layer exposed: Secondary | ICD-10-CM | POA: Diagnosis not present

## 2023-06-20 DIAGNOSIS — L959 Vasculitis limited to the skin, unspecified: Secondary | ICD-10-CM | POA: Diagnosis not present

## 2023-06-20 DIAGNOSIS — I10 Essential (primary) hypertension: Secondary | ICD-10-CM | POA: Diagnosis not present

## 2023-06-20 DIAGNOSIS — Z7901 Long term (current) use of anticoagulants: Secondary | ICD-10-CM | POA: Diagnosis not present

## 2023-06-20 DIAGNOSIS — I890 Lymphedema, not elsewhere classified: Secondary | ICD-10-CM | POA: Diagnosis not present

## 2023-06-20 DIAGNOSIS — I480 Paroxysmal atrial fibrillation: Secondary | ICD-10-CM | POA: Diagnosis not present

## 2023-06-20 DIAGNOSIS — I482 Chronic atrial fibrillation, unspecified: Secondary | ICD-10-CM | POA: Diagnosis not present

## 2023-06-20 DIAGNOSIS — I87333 Chronic venous hypertension (idiopathic) with ulcer and inflammation of bilateral lower extremity: Secondary | ICD-10-CM | POA: Diagnosis not present

## 2023-06-20 NOTE — Progress Notes (Addendum)
upon evaluation today patient appears to be doing well currently in regard to his wound. He is actually been tolerating the dressing changes without complication. Fortunately there does not appear to be any signs of active infection locally nor systemically which is great news and  overall I am extremely IZIC, HEYD R ([id number]) [id number] Page 4 of 11 pleased with where we stand currently. 03-12-2023 upon evaluation today patient's wound actually showed signs of excellent improvement. I am very pleased with where we stand I do believe that we are making good progress here. I do not see any signs of active infection. 03-19-2023 upon evaluation today patient actually appears to be making excellent progress in regard to his leg and getting this closed. In fact is just a very small area that is actually open at this point. I am actually very pleased with what we are seeing today. 03-26-2023 upon evaluation today patient appears to be doing well currently in regard to his leg which is actually showing signs of being completely healed. Fortunately I do not see any signs of active infection locally or systemically which is great news and in general I do believe that we are moving in the right direction here. Readmission: 05-16-2023 upon evaluation today patient presents for reevaluation here in the clinic concerning a wound on the left anterior/lateral lower extremity. This is similar to where the wound was last time I saw him but not exactly the same. Fortunately there does not appear to be any signs of active infection at this time which is great news. The patient's past medical history really has not changed significantly since last time I saw him. 05-24-2023 upon evaluation patient actually appears to be doing excellent in regard to his leg ulcer. He has been tolerating the dressing changes without complication in general I do feel like there were making excellent headway towards complete closure. I do not see any signs of active infection at this point. 8/29; left lateral lower leg in the setting of chronic venous insufficiency. We have been using Hydrofera Blue under Urgo K2. Wounds are making nice improvements 06-20-2023 upon evaluation today patient  appears to be doing well currently in regard to his leg ulcer. This is actually showing signs of being significantly smaller compared even last week's nurse visit this looks much better. Fortunately I do not see any need for sharp debridement he seems to be doing quite well. Electronic Signature(s) Signed: 06/20/2023 3:42:30 PM By: Allen Derry PA-C Entered By: Allen Derry on 06/20/2023 15:42:30 -------------------------------------------------------------------------------- Physical Exam Details Patient Name: Date of Service: [name], [name] 06/20/2023 11:00 A M Medical Record Number: [id number] Patient Account Number: [id number] Date of Birth/Sex: Treating RN: 07/09/1946 (77 y.o. [name]) Yevonne Pax Primary Care Provider: [name] Other Clinician: Referring Provider: Treating Provider/Extender: [name] in Treatment: 5 Constitutional Well-nourished and well-hydrated in no acute distress. Respiratory normal breathing without difficulty. Psychiatric this patient is able to make decisions and demonstrates good insight into disease process. Alert and Oriented x 3. pleasant and cooperative. Notes Upon inspection patient's wound bed actually showed signs of good granulation and epithelization at this point. Fortunately I do not see any signs of worsening overall and I believe that the patient is making excellent headway towards closure which is excellent news. Electronic Signature(s) Signed: 06/20/2023 3:42:43 PM By: Allen Derry PA-C Entered By: Allen Derry on 06/20/2023 15:42:43 [address], [place] ([id number]) [id number] Page 5 of 11 -------------------------------------------------------------------------------- Physician Orders Details Patient Name: Date  of Service: [name], [name] 06/20/2023 11:00 A M Medical Record Number: [id number] Patient Account Number: [id number] Date of Birth/Sex: Treating RN: 28-Mar-1946 (77 y.o. 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 Bathing/ Shower/ Hygiene May shower; gently cleanse wound with antibacterial soap, rinse and pat dry prior to dressing wounds Edema Control - Lymphedema / Segmental Compressive Device / Other Patient to wear own compression stockings. Remove compression stockings every night before going to bed and put on every morning when getting up. - right Elevate, Exercise Daily and A void Standing for Long Periods of Time. Elevate legs to the level of the heart and pump ankles as often as possible Elevate leg(s) parallel to the floor when sitting. Wound Treatment Wound #21 - Lower Leg Wound Laterality: Left, Lateral Cleanser: Soap and Water 2 x Per Week/30 Days Discharge Instructions: Gently cleanse wound with antibacterial soap, rinse and pat dry prior to dressing wounds Cleanser: Wound Cleanser 2 x Per Week/30 Days Discharge Instructions: Wash your hands with soap and water. Remove old dressing, discard into plastic bag and place into trash. Cleanse the wound with Wound Cleanser prior to applying a clean dressing using gauze sponges, not tissues or cotton balls. Do not scrub or use excessive force. Pat dry using gauze sponges, not tissue or cotton balls. Peri-Wound Care: AandD Ointment 2 x Per Week/30 Days Discharge Instructions: Apply AandD Ointment  as directed Prim Dressing: Hydrofera Blue Ready Transfer Foam, 2.5x2.5 (in/in) 2 x Per Week/30 Days ary Discharge Instructions: Apply Hydrofera Blue Ready to wound bed as directed Secondary Dressing: Zetuvit Plus 4x8 (in/in) 2 x Per Week/30 Days Compression Wrap: Urgo K2, two layer compression system, regular 2 x Per Week/30 Days Electronic Signature(s) Signed: 06/20/2023 4:31:22 PM By: Allen Derry PA-C Signed: 06/21/2023 1:15:19 PM By: Yevonne Pax RN Entered By: Yevonne Pax on 06/20/2023 11:23:48 [name] ([id number]) [id number] Page 6 of 11 -------------------------------------------------------------------------------- Problem List Details Patient Name: Date of Service: [name] 06/20/2023 11:00 A M Medical Record Number: [id number] Patient Account Number: [id number] Date of Birth/Sex: Treating RN: 1945/10/25 (77 y.o. 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 06/20/2023 11:00 A M Medical Record Number: [id number] Patient Account Number: [id number] Date of Birth/Sex:  upon evaluation today patient appears to be doing well currently in regard to his wound. He is actually been tolerating the dressing changes without complication. Fortunately there does not appear to be any signs of active infection locally nor systemically which is great news and  overall I am extremely IZIC, HEYD R ([id number]) [id number] Page 4 of 11 pleased with where we stand currently. 03-12-2023 upon evaluation today patient's wound actually showed signs of excellent improvement. I am very pleased with where we stand I do believe that we are making good progress here. I do not see any signs of active infection. 03-19-2023 upon evaluation today patient actually appears to be making excellent progress in regard to his leg and getting this closed. In fact is just a very small area that is actually open at this point. I am actually very pleased with what we are seeing today. 03-26-2023 upon evaluation today patient appears to be doing well currently in regard to his leg which is actually showing signs of being completely healed. Fortunately I do not see any signs of active infection locally or systemically which is great news and in general I do believe that we are moving in the right direction here. Readmission: 05-16-2023 upon evaluation today patient presents for reevaluation here in the clinic concerning a wound on the left anterior/lateral lower extremity. This is similar to where the wound was last time I saw him but not exactly the same. Fortunately there does not appear to be any signs of active infection at this time which is great news. The patient's past medical history really has not changed significantly since last time I saw him. 05-24-2023 upon evaluation patient actually appears to be doing excellent in regard to his leg ulcer. He has been tolerating the dressing changes without complication in general I do feel like there were making excellent headway towards complete closure. I do not see any signs of active infection at this point. 8/29; left lateral lower leg in the setting of chronic venous insufficiency. We have been using Hydrofera Blue under Urgo K2. Wounds are making nice improvements 06-20-2023 upon evaluation today patient  appears to be doing well currently in regard to his leg ulcer. This is actually showing signs of being significantly smaller compared even last week's nurse visit this looks much better. Fortunately I do not see any need for sharp debridement he seems to be doing quite well. Electronic Signature(s) Signed: 06/20/2023 3:42:30 PM By: Allen Derry PA-C Entered By: Allen Derry on 06/20/2023 15:42:30 -------------------------------------------------------------------------------- Physical Exam Details Patient Name: Date of Service: [name], [name] 06/20/2023 11:00 A M Medical Record Number: [id number] Patient Account Number: [id number] Date of Birth/Sex: Treating RN: 07/09/1946 (77 y.o. [name]) Yevonne Pax Primary Care Provider: [name] Other Clinician: Referring Provider: Treating Provider/Extender: [name] in Treatment: 5 Constitutional Well-nourished and well-hydrated in no acute distress. Respiratory normal breathing without difficulty. Psychiatric this patient is able to make decisions and demonstrates good insight into disease process. Alert and Oriented x 3. pleasant and cooperative. Notes Upon inspection patient's wound bed actually showed signs of good granulation and epithelization at this point. Fortunately I do not see any signs of worsening overall and I believe that the patient is making excellent headway towards closure which is excellent news. Electronic Signature(s) Signed: 06/20/2023 3:42:43 PM By: Allen Derry PA-C Entered By: Allen Derry on 06/20/2023 15:42:43 [address], [place] ([id number]) [id number] Page 5 of 11 -------------------------------------------------------------------------------- Physician Orders Details Patient Name: Date  upon evaluation today patient appears to be doing well currently in regard to his wound. He is actually been tolerating the dressing changes without complication. Fortunately there does not appear to be any signs of active infection locally nor systemically which is great news and  overall I am extremely IZIC, HEYD R ([id number]) [id number] Page 4 of 11 pleased with where we stand currently. 03-12-2023 upon evaluation today patient's wound actually showed signs of excellent improvement. I am very pleased with where we stand I do believe that we are making good progress here. I do not see any signs of active infection. 03-19-2023 upon evaluation today patient actually appears to be making excellent progress in regard to his leg and getting this closed. In fact is just a very small area that is actually open at this point. I am actually very pleased with what we are seeing today. 03-26-2023 upon evaluation today patient appears to be doing well currently in regard to his leg which is actually showing signs of being completely healed. Fortunately I do not see any signs of active infection locally or systemically which is great news and in general I do believe that we are moving in the right direction here. Readmission: 05-16-2023 upon evaluation today patient presents for reevaluation here in the clinic concerning a wound on the left anterior/lateral lower extremity. This is similar to where the wound was last time I saw him but not exactly the same. Fortunately there does not appear to be any signs of active infection at this time which is great news. The patient's past medical history really has not changed significantly since last time I saw him. 05-24-2023 upon evaluation patient actually appears to be doing excellent in regard to his leg ulcer. He has been tolerating the dressing changes without complication in general I do feel like there were making excellent headway towards complete closure. I do not see any signs of active infection at this point. 8/29; left lateral lower leg in the setting of chronic venous insufficiency. We have been using Hydrofera Blue under Urgo K2. Wounds are making nice improvements 06-20-2023 upon evaluation today patient  appears to be doing well currently in regard to his leg ulcer. This is actually showing signs of being significantly smaller compared even last week's nurse visit this looks much better. Fortunately I do not see any need for sharp debridement he seems to be doing quite well. Electronic Signature(s) Signed: 06/20/2023 3:42:30 PM By: Allen Derry PA-C Entered By: Allen Derry on 06/20/2023 15:42:30 -------------------------------------------------------------------------------- Physical Exam Details Patient Name: Date of Service: [name], [name] 06/20/2023 11:00 A M Medical Record Number: [id number] Patient Account Number: [id number] Date of Birth/Sex: Treating RN: 07/09/1946 (77 y.o. [name]) Yevonne Pax Primary Care Provider: [name] Other Clinician: Referring Provider: Treating Provider/Extender: [name] in Treatment: 5 Constitutional Well-nourished and well-hydrated in no acute distress. Respiratory normal breathing without difficulty. Psychiatric this patient is able to make decisions and demonstrates good insight into disease process. Alert and Oriented x 3. pleasant and cooperative. Notes Upon inspection patient's wound bed actually showed signs of good granulation and epithelization at this point. Fortunately I do not see any signs of worsening overall and I believe that the patient is making excellent headway towards closure which is excellent news. Electronic Signature(s) Signed: 06/20/2023 3:42:43 PM By: Allen Derry PA-C Entered By: Allen Derry on 06/20/2023 15:42:43 [address], [place] ([id number]) [id number] Page 5 of 11 -------------------------------------------------------------------------------- Physician Orders Details Patient Name: Date  Treating RN: Jun 28, 1946 (77 y.o. [name]) Epps, Lyla Son Primary Care Provider: [name] Other Clinician: Referring Provider: Treating Provider/Extender: [name] in Treatment: 5 Subjective Chief Complaint Information obtained from Patient Left LE Ulcer History of Present Illness (HPI) 77 year old male who has a past medical history of essential hypertension, chronic atrial fibrillation, peripheral vascular disease, nonischemic cardiomyopathy,venous stasis dermatitis, gouty arthropathy, basal cell carcinoma of the right lower extremity, benign prostatic hypertrophy, long-term use of anticoagulation therapy, hyperglycemia and exercise intolerance has never been a smoker. the patient has had a vascular workup over 7 years ago and said everything was normal at that stage. He does not have any chronic problems except for cardiac issues which he sees a cardiologist in [place]. 08/15/2017 -- arterial and venous duplex studies still pending. 08/23/2017 -- venous reflux studies done on 08/13/2017 shows venous incompetence throughout the left lower extremity deep system and focally at the left saphenofemoral junction. 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 No evidence of SVT or DVT in bilateral lower extremities The patient has an appointment at the end of the month to get his arterial duplex study done 09/05/2017 -- the patient was seen at the vein and vascular office yesterday by Bary Castilla. ABI studies were notable for medial calcification and the toe brachial indices were normal and bilateral ankle-brachial) waveforms were normal with triphasic flow. After review of his venous studies  he was not a candidate for laser ablation and his lymphedema was to be treated with compression stockings and lymphedema pump pumps 09/12/2017 -- had a low arterial study done at the [facility] -- unable to obtain reliable ABI is due to medial calcification. Bilateral toe indices were normal with the right being 1.01 and the left being 0.92 and the waveforms were triphasic bilaterally. he did get hold of 30-40 mm compression stockings but is unable to put these on. We will try and get him alternative compression stockings. 09/26/17- he is here in follow up evaluation of a right lower extremity ulcer;he is compliant in wearing compression stocking; ulcer almost epithelialized , anticipate healing next appointment Readmission: 11/17 point upon evaluation patient's wound currently that he is seeing [place] for today is a skin cancerous lesion that was cleared away by his dermatologist on the left medial calf region. He tells me that this is a very similar thing to what he had done previously in fact the last time he saw him in 2018 this was also what was going on at that point. Nonetheless he feels that based on what he seeing currently that this is just having a lot of harder time healing although it is much closer to the surface than what he is experienced in the past. He notes that the initial removal was in June 2022 which was this year this is now November and still has not closed. He does have some edema and definitely I think that there is some venous component to his slow healing here. Also think that we can do something better than Vaseline to try to help with getting this to clear up as quickly as possible. He does have a history of atrial fibrillation and is on Eliquis otherwise he really has no major medical problems that would affect wound healing. 09/07/2021 upon evaluation today patient actually appears to be doing significantly better after having wrapped him last week.  Overall I think that this is making significant improvements at this time which is great news.  of Service: [name], [name] 06/20/2023 11:00 A M Medical Record Number: [id number] Patient Account Number: [id number] Date of Birth/Sex: Treating RN: 28-Mar-1946 (77 y.o. 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 Bathing/ Shower/ Hygiene May shower; gently cleanse wound with antibacterial soap, rinse and pat dry prior to dressing wounds Edema Control - Lymphedema / Segmental Compressive Device / Other Patient to wear own compression stockings. Remove compression stockings every night before going to bed and put on every morning when getting up. - right Elevate, Exercise Daily and A void Standing for Long Periods of Time. Elevate legs to the level of the heart and pump ankles as often as possible Elevate leg(s) parallel to the floor when sitting. Wound Treatment Wound #21 - Lower Leg Wound Laterality: Left, Lateral Cleanser: Soap and Water 2 x Per Week/30 Days Discharge Instructions: Gently cleanse wound with antibacterial soap, rinse and pat dry prior to dressing wounds Cleanser: Wound Cleanser 2 x Per Week/30 Days Discharge Instructions: Wash your hands with soap and water. Remove old dressing, discard into plastic bag and place into trash. Cleanse the wound with Wound Cleanser prior to applying a clean dressing using gauze sponges, not tissues or cotton balls. Do not scrub or use excessive force. Pat dry using gauze sponges, not tissue or cotton balls. Peri-Wound Care: AandD Ointment 2 x Per Week/30 Days Discharge Instructions: Apply AandD Ointment  as directed Prim Dressing: Hydrofera Blue Ready Transfer Foam, 2.5x2.5 (in/in) 2 x Per Week/30 Days ary Discharge Instructions: Apply Hydrofera Blue Ready to wound bed as directed Secondary Dressing: Zetuvit Plus 4x8 (in/in) 2 x Per Week/30 Days Compression Wrap: Urgo K2, two layer compression system, regular 2 x Per Week/30 Days Electronic Signature(s) Signed: 06/20/2023 4:31:22 PM By: Allen Derry PA-C Signed: 06/21/2023 1:15:19 PM By: Yevonne Pax RN Entered By: Yevonne Pax on 06/20/2023 11:23:48 [name] ([id number]) [id number] Page 6 of 11 -------------------------------------------------------------------------------- Problem List Details Patient Name: Date of Service: [name] 06/20/2023 11:00 A M Medical Record Number: [id number] Patient Account Number: [id number] Date of Birth/Sex: Treating RN: 1945/10/25 (77 y.o. 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 06/20/2023 11:00 A M Medical Record Number: [id number] Patient Account Number: [id number] Date of Birth/Sex:  of Service: [name], [name] 06/20/2023 11:00 A M Medical Record Number: [id number] Patient Account Number: [id number] Date of Birth/Sex: Treating RN: 28-Mar-1946 (77 y.o. 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 Bathing/ Shower/ Hygiene May shower; gently cleanse wound with antibacterial soap, rinse and pat dry prior to dressing wounds Edema Control - Lymphedema / Segmental Compressive Device / Other Patient to wear own compression stockings. Remove compression stockings every night before going to bed and put on every morning when getting up. - right Elevate, Exercise Daily and A void Standing for Long Periods of Time. Elevate legs to the level of the heart and pump ankles as often as possible Elevate leg(s) parallel to the floor when sitting. Wound Treatment Wound #21 - Lower Leg Wound Laterality: Left, Lateral Cleanser: Soap and Water 2 x Per Week/30 Days Discharge Instructions: Gently cleanse wound with antibacterial soap, rinse and pat dry prior to dressing wounds Cleanser: Wound Cleanser 2 x Per Week/30 Days Discharge Instructions: Wash your hands with soap and water. Remove old dressing, discard into plastic bag and place into trash. Cleanse the wound with Wound Cleanser prior to applying a clean dressing using gauze sponges, not tissues or cotton balls. Do not scrub or use excessive force. Pat dry using gauze sponges, not tissue or cotton balls. Peri-Wound Care: AandD Ointment 2 x Per Week/30 Days Discharge Instructions: Apply AandD Ointment  as directed Prim Dressing: Hydrofera Blue Ready Transfer Foam, 2.5x2.5 (in/in) 2 x Per Week/30 Days ary Discharge Instructions: Apply Hydrofera Blue Ready to wound bed as directed Secondary Dressing: Zetuvit Plus 4x8 (in/in) 2 x Per Week/30 Days Compression Wrap: Urgo K2, two layer compression system, regular 2 x Per Week/30 Days Electronic Signature(s) Signed: 06/20/2023 4:31:22 PM By: Allen Derry PA-C Signed: 06/21/2023 1:15:19 PM By: Yevonne Pax RN Entered By: Yevonne Pax on 06/20/2023 11:23:48 [name] ([id number]) [id number] Page 6 of 11 -------------------------------------------------------------------------------- Problem List Details Patient Name: Date of Service: [name] 06/20/2023 11:00 A M Medical Record Number: [id number] Patient Account Number: [id number] Date of Birth/Sex: Treating RN: 1945/10/25 (77 y.o. 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 06/20/2023 11:00 A M Medical Record Number: [id number] Patient Account Number: [id number] Date of Birth/Sex:  LAVAR, PATRY R ([id number]) [id number] Page 1 of 11 Visit Report for 06/20/2023 Chief Complaint Document Details Patient Name: Date of Service: [name] 06/20/2023 11:00 A M Medical Record Number: [id number] Patient Account Number: [id number] Date of Birth/Sex: Treating RN: 02/01/1946 (77 y.o. [name]) Yevonne Pax Primary Care Provider: [name] Other Clinician: Referring Provider: Treating Provider/Extender: [name] in Treatment: 5 Information Obtained from: Patient Chief Complaint Left LE Ulcer Electronic Signature(s) Signed: 06/20/2023 11:20:52 AM By: Allen Derry PA-C Entered By: Allen Derry on 06/20/2023 11:20:52 -------------------------------------------------------------------------------- HPI Details Patient Name: Date of Service: [name], [name] 06/20/2023 11:00 A M Medical Record Number: [id number] Patient Account Number: [id number] Date of Birth/Sex: Treating RN: 1946-02-27 (77 y.o. 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 the patient has had a vascular workup over 7 years ago and said everything was normal at that stage. He does not have any chronic problems except for cardiac issues which he sees a cardiologist in [place]. 08/15/2017 -- arterial and venous duplex studies still pending. 08/23/2017 -- venous reflux studies done on 08/13/2017 shows  venous incompetence throughout the left lower extremity deep system and focally at the left saphenofemoral junction. 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 No evidence of SVT or DVT in bilateral lower extremities The patient has an appointment at the end of the month to get his arterial duplex study done 09/05/2017 -- the patient was seen at the vein and vascular office yesterday by Bary Castilla. ABI studies were notable for medial calcification and the toe brachial indices were normal and bilateral ankle-brachial) waveforms were normal with triphasic flow. After review of his venous studies he was not a candidate for laser ablation and his lymphedema was to be treated with compression stockings and lymphedema pump pumps 09/12/2017 -- had a low arterial study done at the [facility] -- unable to obtain reliable ABI is due to medial calcification. Bilateral toe CALLAN, REATEGUI R ([id number]) [id number] Page 2 of 11 indices were normal with the right being 1.01 and the left being 0.92 and the waveforms were triphasic bilaterally. he did get hold of 30-40 mm compression stockings but is unable to put these on. We will try and get him alternative compression stockings. 09/26/17- he is here in follow up evaluation of a right lower extremity ulcer;he is compliant in wearing compression stocking; ulcer almost epithelialized , anticipate healing next appointment Readmission: 11/17 point upon evaluation patient's wound currently that he is seeing [place] for today is a skin cancerous lesion that was cleared away by his dermatologist on the left medial calf region. He tells me that this is a very similar thing to what he had done previously in fact the last time he saw him in 2018 this was also what was going on at that point. Nonetheless he feels that based on what he seeing currently that this is just having a lot of harder time healing  although it is much closer to the surface than what he is experienced in the past.  LAVAR, PATRY R ([id number]) [id number] Page 1 of 11 Visit Report for 06/20/2023 Chief Complaint Document Details Patient Name: Date of Service: [name] 06/20/2023 11:00 A M Medical Record Number: [id number] Patient Account Number: [id number] Date of Birth/Sex: Treating RN: 02/01/1946 (77 y.o. [name]) Yevonne Pax Primary Care Provider: [name] Other Clinician: Referring Provider: Treating Provider/Extender: [name] in Treatment: 5 Information Obtained from: Patient Chief Complaint Left LE Ulcer Electronic Signature(s) Signed: 06/20/2023 11:20:52 AM By: Allen Derry PA-C Entered By: Allen Derry on 06/20/2023 11:20:52 -------------------------------------------------------------------------------- HPI Details Patient Name: Date of Service: [name], [name] 06/20/2023 11:00 A M Medical Record Number: [id number] Patient Account Number: [id number] Date of Birth/Sex: Treating RN: 1946-02-27 (77 y.o. 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 the patient has had a vascular workup over 7 years ago and said everything was normal at that stage. He does not have any chronic problems except for cardiac issues which he sees a cardiologist in [place]. 08/15/2017 -- arterial and venous duplex studies still pending. 08/23/2017 -- venous reflux studies done on 08/13/2017 shows  venous incompetence throughout the left lower extremity deep system and focally at the left saphenofemoral junction. 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 No evidence of SVT or DVT in bilateral lower extremities The patient has an appointment at the end of the month to get his arterial duplex study done 09/05/2017 -- the patient was seen at the vein and vascular office yesterday by Bary Castilla. ABI studies were notable for medial calcification and the toe brachial indices were normal and bilateral ankle-brachial) waveforms were normal with triphasic flow. After review of his venous studies he was not a candidate for laser ablation and his lymphedema was to be treated with compression stockings and lymphedema pump pumps 09/12/2017 -- had a low arterial study done at the [facility] -- unable to obtain reliable ABI is due to medial calcification. Bilateral toe CALLAN, REATEGUI R ([id number]) [id number] Page 2 of 11 indices were normal with the right being 1.01 and the left being 0.92 and the waveforms were triphasic bilaterally. he did get hold of 30-40 mm compression stockings but is unable to put these on. We will try and get him alternative compression stockings. 09/26/17- he is here in follow up evaluation of a right lower extremity ulcer;he is compliant in wearing compression stocking; ulcer almost epithelialized , anticipate healing next appointment Readmission: 11/17 point upon evaluation patient's wound currently that he is seeing [place] for today is a skin cancerous lesion that was cleared away by his dermatologist on the left medial calf region. He tells me that this is a very similar thing to what he had done previously in fact the last time he saw him in 2018 this was also what was going on at that point. Nonetheless he feels that based on what he seeing currently that this is just having a lot of harder time healing  although it is much closer to the surface than what he is experienced in the past.  of Service: [name], [name] 06/20/2023 11:00 A M Medical Record Number: [id number] Patient Account Number: [id number] Date of Birth/Sex: Treating RN: 28-Mar-1946 (77 y.o. 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 Bathing/ Shower/ Hygiene May shower; gently cleanse wound with antibacterial soap, rinse and pat dry prior to dressing wounds Edema Control - Lymphedema / Segmental Compressive Device / Other Patient to wear own compression stockings. Remove compression stockings every night before going to bed and put on every morning when getting up. - right Elevate, Exercise Daily and A void Standing for Long Periods of Time. Elevate legs to the level of the heart and pump ankles as often as possible Elevate leg(s) parallel to the floor when sitting. Wound Treatment Wound #21 - Lower Leg Wound Laterality: Left, Lateral Cleanser: Soap and Water 2 x Per Week/30 Days Discharge Instructions: Gently cleanse wound with antibacterial soap, rinse and pat dry prior to dressing wounds Cleanser: Wound Cleanser 2 x Per Week/30 Days Discharge Instructions: Wash your hands with soap and water. Remove old dressing, discard into plastic bag and place into trash. Cleanse the wound with Wound Cleanser prior to applying a clean dressing using gauze sponges, not tissues or cotton balls. Do not scrub or use excessive force. Pat dry using gauze sponges, not tissue or cotton balls. Peri-Wound Care: AandD Ointment 2 x Per Week/30 Days Discharge Instructions: Apply AandD Ointment  as directed Prim Dressing: Hydrofera Blue Ready Transfer Foam, 2.5x2.5 (in/in) 2 x Per Week/30 Days ary Discharge Instructions: Apply Hydrofera Blue Ready to wound bed as directed Secondary Dressing: Zetuvit Plus 4x8 (in/in) 2 x Per Week/30 Days Compression Wrap: Urgo K2, two layer compression system, regular 2 x Per Week/30 Days Electronic Signature(s) Signed: 06/20/2023 4:31:22 PM By: Allen Derry PA-C Signed: 06/21/2023 1:15:19 PM By: Yevonne Pax RN Entered By: Yevonne Pax on 06/20/2023 11:23:48 [name] ([id number]) [id number] Page 6 of 11 -------------------------------------------------------------------------------- Problem List Details Patient Name: Date of Service: [name] 06/20/2023 11:00 A M Medical Record Number: [id number] Patient Account Number: [id number] Date of Birth/Sex: Treating RN: 1945/10/25 (77 y.o. 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 06/20/2023 11:00 A M Medical Record Number: [id number] Patient Account Number: [id number] Date of Birth/Sex:  upon evaluation today patient appears to be doing well currently in regard to his wound. He is actually been tolerating the dressing changes without complication. Fortunately there does not appear to be any signs of active infection locally nor systemically which is great news and  overall I am extremely IZIC, HEYD R ([id number]) [id number] Page 4 of 11 pleased with where we stand currently. 03-12-2023 upon evaluation today patient's wound actually showed signs of excellent improvement. I am very pleased with where we stand I do believe that we are making good progress here. I do not see any signs of active infection. 03-19-2023 upon evaluation today patient actually appears to be making excellent progress in regard to his leg and getting this closed. In fact is just a very small area that is actually open at this point. I am actually very pleased with what we are seeing today. 03-26-2023 upon evaluation today patient appears to be doing well currently in regard to his leg which is actually showing signs of being completely healed. Fortunately I do not see any signs of active infection locally or systemically which is great news and in general I do believe that we are moving in the right direction here. Readmission: 05-16-2023 upon evaluation today patient presents for reevaluation here in the clinic concerning a wound on the left anterior/lateral lower extremity. This is similar to where the wound was last time I saw him but not exactly the same. Fortunately there does not appear to be any signs of active infection at this time which is great news. The patient's past medical history really has not changed significantly since last time I saw him. 05-24-2023 upon evaluation patient actually appears to be doing excellent in regard to his leg ulcer. He has been tolerating the dressing changes without complication in general I do feel like there were making excellent headway towards complete closure. I do not see any signs of active infection at this point. 8/29; left lateral lower leg in the setting of chronic venous insufficiency. We have been using Hydrofera Blue under Urgo K2. Wounds are making nice improvements 06-20-2023 upon evaluation today patient  appears to be doing well currently in regard to his leg ulcer. This is actually showing signs of being significantly smaller compared even last week's nurse visit this looks much better. Fortunately I do not see any need for sharp debridement he seems to be doing quite well. Electronic Signature(s) Signed: 06/20/2023 3:42:30 PM By: Allen Derry PA-C Entered By: Allen Derry on 06/20/2023 15:42:30 -------------------------------------------------------------------------------- Physical Exam Details Patient Name: Date of Service: [name], [name] 06/20/2023 11:00 A M Medical Record Number: [id number] Patient Account Number: [id number] Date of Birth/Sex: Treating RN: 07/09/1946 (77 y.o. [name]) Yevonne Pax Primary Care Provider: [name] Other Clinician: Referring Provider: Treating Provider/Extender: [name] in Treatment: 5 Constitutional Well-nourished and well-hydrated in no acute distress. Respiratory normal breathing without difficulty. Psychiatric this patient is able to make decisions and demonstrates good insight into disease process. Alert and Oriented x 3. pleasant and cooperative. Notes Upon inspection patient's wound bed actually showed signs of good granulation and epithelization at this point. Fortunately I do not see any signs of worsening overall and I believe that the patient is making excellent headway towards closure which is excellent news. Electronic Signature(s) Signed: 06/20/2023 3:42:43 PM By: Allen Derry PA-C Entered By: Allen Derry on 06/20/2023 15:42:43 [address], [place] R ([id number]) [id number] Page 5 of 11 -------------------------------------------------------------------------------- Physician Orders Details Patient Name: Date  LAVAR, PATRY R ([id number]) [id number] Page 1 of 11 Visit Report for 06/20/2023 Chief Complaint Document Details Patient Name: Date of Service: [name] 06/20/2023 11:00 A M Medical Record Number: [id number] Patient Account Number: [id number] Date of Birth/Sex: Treating RN: 02/01/1946 (77 y.o. [name]) Yevonne Pax Primary Care Provider: [name] Other Clinician: Referring Provider: Treating Provider/Extender: [name] in Treatment: 5 Information Obtained from: Patient Chief Complaint Left LE Ulcer Electronic Signature(s) Signed: 06/20/2023 11:20:52 AM By: Allen Derry PA-C Entered By: Allen Derry on 06/20/2023 11:20:52 -------------------------------------------------------------------------------- HPI Details Patient Name: Date of Service: [name], [name] 06/20/2023 11:00 A M Medical Record Number: [id number] Patient Account Number: [id number] Date of Birth/Sex: Treating RN: 1946-02-27 (77 y.o. 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 the patient has had a vascular workup over 7 years ago and said everything was normal at that stage. He does not have any chronic problems except for cardiac issues which he sees a cardiologist in [place]. 08/15/2017 -- arterial and venous duplex studies still pending. 08/23/2017 -- venous reflux studies done on 08/13/2017 shows  venous incompetence throughout the left lower extremity deep system and focally at the left saphenofemoral junction. 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 No evidence of SVT or DVT in bilateral lower extremities The patient has an appointment at the end of the month to get his arterial duplex study done 09/05/2017 -- the patient was seen at the vein and vascular office yesterday by Bary Castilla. ABI studies were notable for medial calcification and the toe brachial indices were normal and bilateral ankle-brachial) waveforms were normal with triphasic flow. After review of his venous studies he was not a candidate for laser ablation and his lymphedema was to be treated with compression stockings and lymphedema pump pumps 09/12/2017 -- had a low arterial study done at the [facility] -- unable to obtain reliable ABI is due to medial calcification. Bilateral toe CALLAN, REATEGUI R ([id number]) [id number] Page 2 of 11 indices were normal with the right being 1.01 and the left being 0.92 and the waveforms were triphasic bilaterally. he did get hold of 30-40 mm compression stockings but is unable to put these on. We will try and get him alternative compression stockings. 09/26/17- he is here in follow up evaluation of a right lower extremity ulcer;he is compliant in wearing compression stocking; ulcer almost epithelialized , anticipate healing next appointment Readmission: 11/17 point upon evaluation patient's wound currently that he is seeing [place] for today is a skin cancerous lesion that was cleared away by his dermatologist on the left medial calf region. He tells me that this is a very similar thing to what he had done previously in fact the last time he saw him in 2018 this was also what was going on at that point. Nonetheless he feels that based on what he seeing currently that this is just having a lot of harder time healing  although it is much closer to the surface than what he is experienced in the past.  Treating RN: Jun 28, 1946 (77 y.o. [name]) Epps, Lyla Son Primary Care Provider: [name] Other Clinician: Referring Provider: Treating Provider/Extender: [name] in Treatment: 5 Subjective Chief Complaint Information obtained from Patient Left LE Ulcer History of Present Illness (HPI) 77 year old male who has a past medical history of essential hypertension, chronic atrial fibrillation, peripheral vascular disease, nonischemic cardiomyopathy,venous stasis dermatitis, gouty arthropathy, basal cell carcinoma of the right lower extremity, benign prostatic hypertrophy, long-term use of anticoagulation therapy, hyperglycemia and exercise intolerance has never been a smoker. the patient has had a vascular workup over 7 years ago and said everything was normal at that stage. He does not have any chronic problems except for cardiac issues which he sees a cardiologist in [place]. 08/15/2017 -- arterial and venous duplex studies still pending. 08/23/2017 -- venous reflux studies done on 08/13/2017 shows venous incompetence throughout the left lower extremity deep system and focally at the left saphenofemoral junction. 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 No evidence of SVT or DVT in bilateral lower extremities The patient has an appointment at the end of the month to get his arterial duplex study done 09/05/2017 -- the patient was seen at the vein and vascular office yesterday by Bary Castilla. ABI studies were notable for medial calcification and the toe brachial indices were normal and bilateral ankle-brachial) waveforms were normal with triphasic flow. After review of his venous studies  he was not a candidate for laser ablation and his lymphedema was to be treated with compression stockings and lymphedema pump pumps 09/12/2017 -- had a low arterial study done at the [facility] -- unable to obtain reliable ABI is due to medial calcification. Bilateral toe indices were normal with the right being 1.01 and the left being 0.92 and the waveforms were triphasic bilaterally. he did get hold of 30-40 mm compression stockings but is unable to put these on. We will try and get him alternative compression stockings. 09/26/17- he is here in follow up evaluation of a right lower extremity ulcer;he is compliant in wearing compression stocking; ulcer almost epithelialized , anticipate healing next appointment Readmission: 11/17 point upon evaluation patient's wound currently that he is seeing [place] for today is a skin cancerous lesion that was cleared away by his dermatologist on the left medial calf region. He tells me that this is a very similar thing to what he had done previously in fact the last time he saw him in 2018 this was also what was going on at that point. Nonetheless he feels that based on what he seeing currently that this is just having a lot of harder time healing although it is much closer to the surface than what he is experienced in the past. He notes that the initial removal was in June 2022 which was this year this is now November and still has not closed. He does have some edema and definitely I think that there is some venous component to his slow healing here. Also think that we can do something better than Vaseline to try to help with getting this to clear up as quickly as possible. He does have a history of atrial fibrillation and is on Eliquis otherwise he really has no major medical problems that would affect wound healing. 09/07/2021 upon evaluation today patient actually appears to be doing significantly better after having wrapped him last week.  Overall I think that this is making significant improvements at this time which is great news.  Treating RN: Jun 28, 1946 (77 y.o. [name]) Epps, Lyla Son Primary Care Provider: [name] Other Clinician: Referring Provider: Treating Provider/Extender: [name] in Treatment: 5 Subjective Chief Complaint Information obtained from Patient Left LE Ulcer History of Present Illness (HPI) 77 year old male who has a past medical history of essential hypertension, chronic atrial fibrillation, peripheral vascular disease, nonischemic cardiomyopathy,venous stasis dermatitis, gouty arthropathy, basal cell carcinoma of the right lower extremity, benign prostatic hypertrophy, long-term use of anticoagulation therapy, hyperglycemia and exercise intolerance has never been a smoker. the patient has had a vascular workup over 7 years ago and said everything was normal at that stage. He does not have any chronic problems except for cardiac issues which he sees a cardiologist in [place]. 08/15/2017 -- arterial and venous duplex studies still pending. 08/23/2017 -- venous reflux studies done on 08/13/2017 shows venous incompetence throughout the left lower extremity deep system and focally at the left saphenofemoral junction. 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 No evidence of SVT or DVT in bilateral lower extremities The patient has an appointment at the end of the month to get his arterial duplex study done 09/05/2017 -- the patient was seen at the vein and vascular office yesterday by Bary Castilla. ABI studies were notable for medial calcification and the toe brachial indices were normal and bilateral ankle-brachial) waveforms were normal with triphasic flow. After review of his venous studies  he was not a candidate for laser ablation and his lymphedema was to be treated with compression stockings and lymphedema pump pumps 09/12/2017 -- had a low arterial study done at the [facility] -- unable to obtain reliable ABI is due to medial calcification. Bilateral toe indices were normal with the right being 1.01 and the left being 0.92 and the waveforms were triphasic bilaterally. he did get hold of 30-40 mm compression stockings but is unable to put these on. We will try and get him alternative compression stockings. 09/26/17- he is here in follow up evaluation of a right lower extremity ulcer;he is compliant in wearing compression stocking; ulcer almost epithelialized , anticipate healing next appointment Readmission: 11/17 point upon evaluation patient's wound currently that he is seeing [place] for today is a skin cancerous lesion that was cleared away by his dermatologist on the left medial calf region. He tells me that this is a very similar thing to what he had done previously in fact the last time he saw him in 2018 this was also what was going on at that point. Nonetheless he feels that based on what he seeing currently that this is just having a lot of harder time healing although it is much closer to the surface than what he is experienced in the past. He notes that the initial removal was in June 2022 which was this year this is now November and still has not closed. He does have some edema and definitely I think that there is some venous component to his slow healing here. Also think that we can do something better than Vaseline to try to help with getting this to clear up as quickly as possible. He does have a history of atrial fibrillation and is on Eliquis otherwise he really has no major medical problems that would affect wound healing. 09/07/2021 upon evaluation today patient actually appears to be doing significantly better after having wrapped him last week.  Overall I think that this is making significant improvements at this time which is great news.  upon evaluation today patient appears to be doing well currently in regard to his wound. He is actually been tolerating the dressing changes without complication. Fortunately there does not appear to be any signs of active infection locally nor systemically which is great news and  overall I am extremely IZIC, HEYD R ([id number]) [id number] Page 4 of 11 pleased with where we stand currently. 03-12-2023 upon evaluation today patient's wound actually showed signs of excellent improvement. I am very pleased with where we stand I do believe that we are making good progress here. I do not see any signs of active infection. 03-19-2023 upon evaluation today patient actually appears to be making excellent progress in regard to his leg and getting this closed. In fact is just a very small area that is actually open at this point. I am actually very pleased with what we are seeing today. 03-26-2023 upon evaluation today patient appears to be doing well currently in regard to his leg which is actually showing signs of being completely healed. Fortunately I do not see any signs of active infection locally or systemically which is great news and in general I do believe that we are moving in the right direction here. Readmission: 05-16-2023 upon evaluation today patient presents for reevaluation here in the clinic concerning a wound on the left anterior/lateral lower extremity. This is similar to where the wound was last time I saw him but not exactly the same. Fortunately there does not appear to be any signs of active infection at this time which is great news. The patient's past medical history really has not changed significantly since last time I saw him. 05-24-2023 upon evaluation patient actually appears to be doing excellent in regard to his leg ulcer. He has been tolerating the dressing changes without complication in general I do feel like there were making excellent headway towards complete closure. I do not see any signs of active infection at this point. 8/29; left lateral lower leg in the setting of chronic venous insufficiency. We have been using Hydrofera Blue under Urgo K2. Wounds are making nice improvements 06-20-2023 upon evaluation today patient  appears to be doing well currently in regard to his leg ulcer. This is actually showing signs of being significantly smaller compared even last week's nurse visit this looks much better. Fortunately I do not see any need for sharp debridement he seems to be doing quite well. Electronic Signature(s) Signed: 06/20/2023 3:42:30 PM By: Allen Derry PA-C Entered By: Allen Derry on 06/20/2023 15:42:30 -------------------------------------------------------------------------------- Physical Exam Details Patient Name: Date of Service: [name], [name] 06/20/2023 11:00 A M Medical Record Number: [id number] Patient Account Number: [id number] Date of Birth/Sex: Treating RN: 07/09/1946 (77 y.o. [name]) Yevonne Pax Primary Care Provider: [name] Other Clinician: Referring Provider: Treating Provider/Extender: [name] in Treatment: 5 Constitutional Well-nourished and well-hydrated in no acute distress. Respiratory normal breathing without difficulty. Psychiatric this patient is able to make decisions and demonstrates good insight into disease process. Alert and Oriented x 3. pleasant and cooperative. Notes Upon inspection patient's wound bed actually showed signs of good granulation and epithelization at this point. Fortunately I do not see any signs of worsening overall and I believe that the patient is making excellent headway towards closure which is excellent news. Electronic Signature(s) Signed: 06/20/2023 3:42:43 PM By: Allen Derry PA-C Entered By: Allen Derry on 06/20/2023 15:42:43 [address], [place] R ([id number]) [id number] Page 5 of 11 -------------------------------------------------------------------------------- Physician Orders Details Patient Name: Date  of Service: [name], [name] 06/20/2023 11:00 A M Medical Record Number: [id number] Patient Account Number: [id number] Date of Birth/Sex: Treating RN: 28-Mar-1946 (77 y.o. 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 Bathing/ Shower/ Hygiene May shower; gently cleanse wound with antibacterial soap, rinse and pat dry prior to dressing wounds Edema Control - Lymphedema / Segmental Compressive Device / Other Patient to wear own compression stockings. Remove compression stockings every night before going to bed and put on every morning when getting up. - right Elevate, Exercise Daily and A void Standing for Long Periods of Time. Elevate legs to the level of the heart and pump ankles as often as possible Elevate leg(s) parallel to the floor when sitting. Wound Treatment Wound #21 - Lower Leg Wound Laterality: Left, Lateral Cleanser: Soap and Water 2 x Per Week/30 Days Discharge Instructions: Gently cleanse wound with antibacterial soap, rinse and pat dry prior to dressing wounds Cleanser: Wound Cleanser 2 x Per Week/30 Days Discharge Instructions: Wash your hands with soap and water. Remove old dressing, discard into plastic bag and place into trash. Cleanse the wound with Wound Cleanser prior to applying a clean dressing using gauze sponges, not tissues or cotton balls. Do not scrub or use excessive force. Pat dry using gauze sponges, not tissue or cotton balls. Peri-Wound Care: AandD Ointment 2 x Per Week/30 Days Discharge Instructions: Apply AandD Ointment  as directed Prim Dressing: Hydrofera Blue Ready Transfer Foam, 2.5x2.5 (in/in) 2 x Per Week/30 Days ary Discharge Instructions: Apply Hydrofera Blue Ready to wound bed as directed Secondary Dressing: Zetuvit Plus 4x8 (in/in) 2 x Per Week/30 Days Compression Wrap: Urgo K2, two layer compression system, regular 2 x Per Week/30 Days Electronic Signature(s) Signed: 06/20/2023 4:31:22 PM By: Allen Derry PA-C Signed: 06/21/2023 1:15:19 PM By: Yevonne Pax RN Entered By: Yevonne Pax on 06/20/2023 11:23:48 [name] ([id number]) [id number] Page 6 of 11 -------------------------------------------------------------------------------- Problem List Details Patient Name: Date of Service: [name] 06/20/2023 11:00 A M Medical Record Number: [id number] Patient Account Number: [id number] Date of Birth/Sex: Treating RN: 1945/10/25 (77 y.o. 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 06/20/2023 11:00 A M Medical Record Number: [id number] Patient Account Number: [id number] Date of Birth/Sex:

## 2023-06-21 NOTE — Progress Notes (Signed)
cm) 0.297 % Reduction in Area: 78.4% % Reduction in Volume: 78.4% Epithelialization: None Tunneling: No Undermining: No Wound Description Classification: Full Thickness  Without Exposed Support Structures Exudate Amount: Medium Exudate Type: Serosanguineous Exudate Color: red, brown Foul Odor After Cleansing: No Slough/Fibrino Yes Wound Bed Granulation Amount: Small (1-33%) Exposed Structure Granulation Quality: Red Fascia Exposed: No Necrotic Amount: Large (67-100%) Fat Layer (Subcutaneous Tissue) Exposed: Yes Tendon Exposed: No Muscle Exposed: No Joint Exposed: No Bone Exposed: No Treatment Notes Wound #21 (Lower Leg) Wound Laterality: Left, Lateral Cleanser Soap and Water Discharge Instruction: Gently cleanse wound with antibacterial soap, rinse and pat dry prior to dressing wounds Wound Cleanser Discharge Instruction: Wash your hands with soap and water. Remove old dressing, discard into plastic bag and place into trash. Cleanse the wound with Wound Cleanser prior to applying [name] clean dressing using gauze sponges, not tissues or cotton balls. Do not scrub or use excessive force. Pat dry using gauze sponges, not tissue or cotton balls. Peri-Wound Care AandD Ointment Discharge Instruction: Apply AandD Ointment as directed Topical Primary Dressing Hydrofera Blue Ready Transfer Foam, 2.5x2.5 (in/in) Discharge Instruction: Apply Hydrofera Blue Ready to wound bed as directed Secondary Dressing Zetuvit Plus 4x8 (in/in) Secured With Compression Wrap Urgo K2, two layer compression system, regular Compression Stockings Add-Ons Electronic Signature(s) Signed: 06/21/2023 1:15:19 PM By: Yevonne Pax RN Entered By: Yevonne Pax on 06/20/2023 11:06:38 [name] ([id number]) [id number] Page 9 of 9 -------------------------------------------------------------------------------- Vitals Details Patient Name: Date of Service: [name] 06/20/2023 11:00 [name] M Medical Record Number: [id number] Patient Account Number: [id number] Date of Birth/Sex: Treating RN: 12/13/1945 (77 y.o. [name]) Yevonne Pax Primary Care  Jetson Pickrel: [name] Other Clinician: Referring Shalamar Plourde: Treating Krystian Younglove/Extender: [name] in Treatment: 5 Vital Signs Time Taken: 10:54 Temperature (F): 98.3 Height (in): 75 Pulse (bpm): 64 Weight (lbs): 220 Respiratory Rate (breaths/min): 18 Body Mass Index (BMI): 27.5 Blood Pressure (mmHg): 165/70 Reference Range: 80 - 120 mg / dl Electronic Signature(s) Signed: 06/21/2023 1:15:19 PM By: Yevonne Pax RN Entered By: Yevonne Pax on 06/20/2023 10:54:51  cm) 0.297 % Reduction in Area: 78.4% % Reduction in Volume: 78.4% Epithelialization: None Tunneling: No Undermining: No Wound Description Classification: Full Thickness  Without Exposed Support Structures Exudate Amount: Medium Exudate Type: Serosanguineous Exudate Color: red, brown Foul Odor After Cleansing: No Slough/Fibrino Yes Wound Bed Granulation Amount: Small (1-33%) Exposed Structure Granulation Quality: Red Fascia Exposed: No Necrotic Amount: Large (67-100%) Fat Layer (Subcutaneous Tissue) Exposed: Yes Tendon Exposed: No Muscle Exposed: No Joint Exposed: No Bone Exposed: No Treatment Notes Wound #21 (Lower Leg) Wound Laterality: Left, Lateral Cleanser Soap and Water Discharge Instruction: Gently cleanse wound with antibacterial soap, rinse and pat dry prior to dressing wounds Wound Cleanser Discharge Instruction: Wash your hands with soap and water. Remove old dressing, discard into plastic bag and place into trash. Cleanse the wound with Wound Cleanser prior to applying [name] clean dressing using gauze sponges, not tissues or cotton balls. Do not scrub or use excessive force. Pat dry using gauze sponges, not tissue or cotton balls. Peri-Wound Care AandD Ointment Discharge Instruction: Apply AandD Ointment as directed Topical Primary Dressing Hydrofera Blue Ready Transfer Foam, 2.5x2.5 (in/in) Discharge Instruction: Apply Hydrofera Blue Ready to wound bed as directed Secondary Dressing Zetuvit Plus 4x8 (in/in) Secured With Compression Wrap Urgo K2, two layer compression system, regular Compression Stockings Add-Ons Electronic Signature(s) Signed: 06/21/2023 1:15:19 PM By: Yevonne Pax RN Entered By: Yevonne Pax on 06/20/2023 11:06:38 [name] ([id number]) [id number] Page 9 of 9 -------------------------------------------------------------------------------- Vitals Details Patient Name: Date of Service: [name] 06/20/2023 11:00 [name] M Medical Record Number: [id number] Patient Account Number: [id number] Date of Birth/Sex: Treating RN: 12/13/1945 (77 y.o. [name]) Yevonne Pax Primary Care  Jetson Pickrel: [name] Other Clinician: Referring Shalamar Plourde: Treating Krystian Younglove/Extender: [name] in Treatment: 5 Vital Signs Time Taken: 10:54 Temperature (F): 98.3 Height (in): 75 Pulse (bpm): 64 Weight (lbs): 220 Respiratory Rate (breaths/min): 18 Body Mass Index (BMI): 27.5 Blood Pressure (mmHg): 165/70 Reference Range: 80 - 120 mg / dl Electronic Signature(s) Signed: 06/21/2023 1:15:19 PM By: Yevonne Pax RN Entered By: Yevonne Pax on 06/20/2023 10:54:51  Tendon: No Muscle: No Joint: No Bone: No None Epithelialization:]  [N/[name]:N/[name] N/[name] N/[name] N/[name] N/[name] N/[name] N/[name] N/[name] N/[name] N/[name] N/[name] N/[name] N/[name] N/[name] N/[name] N/[name] N/[name] N/[name] N/[name] N/[name] N/[name]] Treatment Notes Electronic Signature(s) Signed: 06/21/2023 1:15:19 PM By: Yevonne Pax RN Entered By: Yevonne Pax on 06/20/2023 11:07:50 -------------------------------------------------------------------------------- Multi-Disciplinary Care Plan Details Patient Name: Date of Service: [name], [name] 06/20/2023 11:00 [name] M Medical Record Number: [id number] Patient Account Number: [id number] Date of Birth/Sex: Treating RN: June 08, 1946 (77 y.o. [name]) Yevonne Pax Primary Care Makenize Messman: [name] Other Clinician: Referring Bobbijo Holst: Treating Lesslie Mckeehan/Extender: [name] in Treatment: 5 Active Inactive Wound/Skin Impairment Nursing Diagnoses: Knowledge deficit related to ulceration/compromised skin integrity Goals: Patient/caregiver will verbalize understanding of skin care regimen Date Initiated: 05/16/2023 Target  Resolution Date: 07/16/2023 Goal Status: Active Ulcer/skin breakdown will have [name] volume reduction of 30% by week 4 Date Initiated: 05/16/2023 Date Inactivated: 06/20/2023 Target Resolution Date: 06/16/2023 Goal Status: Met Ulcer/skin breakdown will have [name] volume reduction of 50% by week 8 Date Initiated: 05/16/2023 Target Resolution Date: 07/16/2023 Goal Status: [address] [place]VO, RENTER ([id number]) [id number] Page 6 of 9 Ulcer/skin breakdown will have [name] volume reduction of 80% by week 12 Date Initiated: 05/16/2023 Target Resolution Date: 08/16/2023 Goal Status: Active Ulcer/skin breakdown will heal within 14 weeks Date Initiated: 05/16/2023 Target Resolution Date: 09/15/2023 Goal Status: Active Interventions: Assess patient/caregiver ability to obtain necessary supplies Assess patient/caregiver ability to perform ulcer/skin care regimen upon admission and as needed Assess ulceration(s) every visit Notes: Electronic Signature(s) Signed: 06/20/2023 11:48:31 AM By: Yevonne Pax RN Entered By: Yevonne Pax on 06/20/2023 11:48:31 -------------------------------------------------------------------------------- Pain Assessment Details Patient Name: Date of Service: [name] Feliz Beam [name] 06/20/2023 11:00 [name] M Medical Record Number: [id number] Patient Account Number: [id number] Date of Birth/Sex: Treating RN: 09/18/1946 (77 y.o. [name]) Yevonne Pax Primary Care Daliah Chaudoin: [name] Other Clinician: Referring Kinzlee Selvy: Treating Seirra Kos/Extender: [name] in Treatment: 5 Active Problems Location of Pain Severity and Description of Pain Patient Has Paino No Site Locations Pain Management and Medication Current Pain Management: Electronic Signature(s) Signed: 06/21/2023 1:15:19 PM By: Yevonne Pax RN Entered By: Yevonne Pax on 06/20/2023 10:55:06 [name] [id number] Page 7 of  9 -------------------------------------------------------------------------------- Patient/Caregiver Education Details Patient Name: Date of Service: [name] [name] 9/12/2024andnbsp11:00 [name] M Medical Record Number: [id number] Patient Account Number: [id number] Date of Birth/Gender: Treating RN: 1946/10/05 (77 y.o. [name]) Yevonne Pax Primary Care Physician: [name] Other Clinician: Referring Physician: Treating Physician/Extender: [name] in Treatment: 5 Education Assessment Education Provided To: Patient Education Topics Provided Wound/Skin Impairment: Handouts: Caring for Your Ulcer Methods: Explain/Verbal Responses: State content correctly Electronic Signature(s) Signed: 06/21/2023 1:15:19 PM By: Yevonne Pax RN Entered By: Yevonne Pax on 06/20/2023 11:09:17 -------------------------------------------------------------------------------- Wound Assessment Details Patient Name: Date of Service: [name] Feliz Beam [name] 06/20/2023 11:00 [name] M Medical Record Number: [id number] Patient Account Number: [id number] Date of Birth/Sex: Treating RN: 1946/04/07 (77 y.o. [name]) Yevonne Pax Primary Care Kasyn Rolph: [name] Other Clinician: Referring Zuma Hust: Treating Jah Alarid/Extender: [name] in Treatment: 5 Wound Status Wound Number: 21 Primary Etiology: Vasculitis Wound Location: Left, Lateral Lower Leg Wound Status: Open Wounding Event: Gradually Appeared Comorbid History: Arrhythmia, Hypertension, Gout Date Acquired: 04/08/2023 Weeks Of Treatment: 5 Clustered Wound: No Photos QUANTARIUS, SWIDER [name] ([id number]) [id number] Page 8 of 9 Wound Measurements Length: (cm) 2.7 Width: (cm) 1.4 Depth: (cm) 0.1 Area: (cm) 2.969 Volume: (  Tendon: No Muscle: No Joint: No Bone: No None Epithelialization:]  [N/[name]:N/[name] N/[name] N/[name] N/[name] N/[name] N/[name] N/[name] N/[name] N/[name] N/[name] N/[name] N/[name] N/[name] N/[name] N/[name] N/[name] N/[name] N/[name] N/[name] N/[name] N/[name]] Treatment Notes Electronic Signature(s) Signed: 06/21/2023 1:15:19 PM By: Yevonne Pax RN Entered By: Yevonne Pax on 06/20/2023 11:07:50 -------------------------------------------------------------------------------- Multi-Disciplinary Care Plan Details Patient Name: Date of Service: [name], [name] 06/20/2023 11:00 [name] M Medical Record Number: [id number] Patient Account Number: [id number] Date of Birth/Sex: Treating RN: June 08, 1946 (77 y.o. [name]) Yevonne Pax Primary Care Makenize Messman: [name] Other Clinician: Referring Bobbijo Holst: Treating Lesslie Mckeehan/Extender: [name] in Treatment: 5 Active Inactive Wound/Skin Impairment Nursing Diagnoses: Knowledge deficit related to ulceration/compromised skin integrity Goals: Patient/caregiver will verbalize understanding of skin care regimen Date Initiated: 05/16/2023 Target  Resolution Date: 07/16/2023 Goal Status: Active Ulcer/skin breakdown will have [name] volume reduction of 30% by week 4 Date Initiated: 05/16/2023 Date Inactivated: 06/20/2023 Target Resolution Date: 06/16/2023 Goal Status: Met Ulcer/skin breakdown will have [name] volume reduction of 50% by week 8 Date Initiated: 05/16/2023 Target Resolution Date: 07/16/2023 Goal Status: [address] [place], R[place] ([id number]) [id number] Page 6 of 9 Ulcer/skin breakdown will have [name] volume reduction of 80% by week 12 Date Initiated: 05/16/2023 Target Resolution Date: 08/16/2023 Goal Status: Active Ulcer/skin breakdown will heal within 14 weeks Date Initiated: 05/16/2023 Target Resolution Date: 09/15/2023 Goal Status: Active Interventions: Assess patient/caregiver ability to obtain necessary supplies Assess patient/caregiver ability to perform ulcer/skin care regimen upon admission and as needed Assess ulceration(s) every visit Notes: Electronic Signature(s) Signed: 06/20/2023 11:48:31 AM By: Yevonne Pax RN Entered By: Yevonne Pax on 06/20/2023 11:48:31 -------------------------------------------------------------------------------- Pain Assessment Details Patient Name: Date of Service: [name] Feliz Beam [name] 06/20/2023 11:00 [name] M Medical Record Number: [id number] Patient Account Number: [id number] Date of Birth/Sex: Treating RN: 09/18/1946 (77 y.o. [name]) Yevonne Pax Primary Care Daliah Chaudoin: [name] Other Clinician: Referring Kinzlee Selvy: Treating Seirra Kos/Extender: [name] in Treatment: 5 Active Problems Location of Pain Severity and Description of Pain Patient Has Paino No Site Locations Pain Management and Medication Current Pain Management: Electronic Signature(s) Signed: 06/21/2023 1:15:19 PM By: Yevonne Pax RN Entered By: Yevonne Pax on 06/20/2023 10:55:06 [name] [id number] Page 7 of  9 -------------------------------------------------------------------------------- Patient/Caregiver Education Details Patient Name: Date of Service: [name] [name] 9/12/2024andnbsp11:00 [name] M Medical Record Number: [id number] Patient Account Number: [id number] Date of Birth/Gender: Treating RN: 1946/10/05 (77 y.o. [name]) Yevonne Pax Primary Care Physician: [name] Other Clinician: Referring Physician: Treating Physician/Extender: [name] in Treatment: 5 Education Assessment Education Provided To: Patient Education Topics Provided Wound/Skin Impairment: Handouts: Caring for Your Ulcer Methods: Explain/Verbal Responses: State content correctly Electronic Signature(s) Signed: 06/21/2023 1:15:19 PM By: Yevonne Pax RN Entered By: Yevonne Pax on 06/20/2023 11:09:17 -------------------------------------------------------------------------------- Wound Assessment Details Patient Name: Date of Service: [name] Feliz Beam [name] 06/20/2023 11:00 [name] M Medical Record Number: [id number] Patient Account Number: [id number] Date of Birth/Sex: Treating RN: 1946/04/07 (77 y.o. [name]) Yevonne Pax Primary Care Kasyn Rolph: [name] Other Clinician: Referring Zuma Hust: Treating Jah Alarid/Extender: [name] in Treatment: 5 Wound Status Wound Number: 21 Primary Etiology: Vasculitis Wound Location: Left, Lateral Lower Leg Wound Status: Open Wounding Event: Gradually Appeared Comorbid History: Arrhythmia, Hypertension, Gout Date Acquired: 04/08/2023 Weeks Of Treatment: 5 Clustered Wound: No Photos QUANTARIUS, SWIDER [name] ([id number]) [id number] Page 8 of 9 Wound Measurements Length: (cm) 2.7 Width: (cm) 1.4 Depth: (cm) 0.1 Area: (cm) 2.969 Volume: (

## 2023-06-27 ENCOUNTER — Encounter: Payer: Medicare PPO | Admitting: Physician Assistant

## 2023-06-27 DIAGNOSIS — I10 Essential (primary) hypertension: Secondary | ICD-10-CM | POA: Diagnosis not present

## 2023-06-27 DIAGNOSIS — I428 Other cardiomyopathies: Secondary | ICD-10-CM | POA: Diagnosis not present

## 2023-06-27 DIAGNOSIS — I480 Paroxysmal atrial fibrillation: Secondary | ICD-10-CM | POA: Diagnosis not present

## 2023-06-27 DIAGNOSIS — I890 Lymphedema, not elsewhere classified: Secondary | ICD-10-CM | POA: Diagnosis not present

## 2023-06-27 DIAGNOSIS — I87332 Chronic venous hypertension (idiopathic) with ulcer and inflammation of left lower extremity: Secondary | ICD-10-CM | POA: Diagnosis not present

## 2023-06-27 DIAGNOSIS — L97822 Non-pressure chronic ulcer of other part of left lower leg with fat layer exposed: Secondary | ICD-10-CM | POA: Diagnosis not present

## 2023-06-27 DIAGNOSIS — I7389 Other specified peripheral vascular diseases: Secondary | ICD-10-CM | POA: Diagnosis not present

## 2023-06-27 DIAGNOSIS — Z7901 Long term (current) use of anticoagulants: Secondary | ICD-10-CM | POA: Diagnosis not present

## 2023-06-27 DIAGNOSIS — I482 Chronic atrial fibrillation, unspecified: Secondary | ICD-10-CM | POA: Diagnosis not present

## 2023-06-27 DIAGNOSIS — I87333 Chronic venous hypertension (idiopathic) with ulcer and inflammation of bilateral lower extremity: Secondary | ICD-10-CM | POA: Diagnosis not present

## 2023-06-27 DIAGNOSIS — I872 Venous insufficiency (chronic) (peripheral): Secondary | ICD-10-CM | POA: Diagnosis not present

## 2023-06-27 NOTE — Progress Notes (Addendum)
[name], [name] ([id number]) [id number] Page 1 of 12 Visit Report for 06/27/2023 Chief Complaint Document Details Patient Name: Date of Service: [name] 06/27/2023 12:15 PM Medical Record Number: [id number] Patient Account Number: [id number] Date of Birth/Sex: Treating RN: [birth year] (77 y.o. [name] Primary Care Provider: [name] Other Clinician: Referring Provider: Treating Provider/Extender: [name] in Treatment: 6 Information Obtained from: Patient Chief Complaint Left LE Ulcer Electronic Signature(s) Signed: 06/27/2023 12:43:26 PM By: Allen Derry PA-C Entered By: Allen Derry on 06/27/2023 12:43:26 -------------------------------------------------------------------------------- HPI Details Patient Name: Date of Service: [name] 06/27/2023 12:15 PM Medical Record Number: [id number] Patient Account Number: [id number] Date of Birth/Sex: Treating RN: 11/03/1945 (77 y.o. [name] Primary Care Provider: [name] Other Clinician: Referring Provider: Treating Provider/Extender: [name] in Treatment: 6 History of Present Illness HPI Description: 77 year old male who has a past medical history of essential hypertension, chronic atrial fibrillation, peripheral vascular disease, nonischemic cardiomyopathy,venous stasis dermatitis, gouty arthropathy, basal cell carcinoma of the right lower extremity, benign prostatic hypertrophy, long- term use of anticoagulation therapy, hyperglycemia and exercise intolerance has never been a smoker. the patient has had a vascular workup over 7 years ago and said everything was normal at that stage. He does not have any chronic problems except for cardiac issues which he sees a cardiologist in [place]. 08/15/2017 -- arterial and venous duplex studies still pending. 08/23/2017 -- venous reflux studies done on 08/13/2017 shows  venous incompetence throughout the left lower extremity deep system and focally at the left saphenofemoral junction. 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 No evidence of SVT or DVT in bilateral lower extremities The patient has an appointment at the end of the month to get his arterial duplex study done 09/05/2017 -- the patient was seen at the vein and vascular office yesterday by Bary Castilla. ABI studies were notable for medial calcification and the toe brachial indices were normal and bilateral ankle-brachial) waveforms were normal with triphasic flow. After review of his venous studies he was not a candidate for laser ablation and his lymphedema was to be treated with compression stockings and lymphedema pump pumps 09/12/2017 -- had a low arterial study done at the [facility] -- unable to obtain reliable ABI is due to medial calcification. Bilateral toe [name] ([id number]) [id number] Page 2 of 12 indices were normal with the right being 1.01 and the left being 0.92 and the waveforms were triphasic bilaterally. he did get hold of 30-40 mm compression stockings but is unable to put these on. We will try and get him alternative compression stockings. 09/26/17- he is here in follow up evaluation of a right lower extremity ulcer;he is compliant in wearing compression stocking; ulcer almost epithelialized , anticipate healing next appointment Readmission: 11/17 point upon evaluation patient's wound currently that he is seeing [place] for today is a skin cancerous lesion that was cleared away by his dermatologist on the left medial calf region. He tells me that this is a very similar thing to what he had done previously in fact the last time he saw him in 2018 this was also what was going on at that point. Nonetheless he feels that based on what he seeing currently that this is just having a lot of harder time healing  although it is much closer to the surface than what he is experienced in the past. He notes  Transfer Foam, 2.5x2.5 (in/in) 2 x Per Week/30 Days ary Discharge Instructions: Apply Hydrofera Blue Ready to wound bed as directed Secondary Dressing: Zetuvit Plus 4x8 (in/in) 2 x Per Week/30 Days Com pression Wrap: Urgo K2, two layer  compression system, regular 2 x Per Week/30 Days 1. Due to the folliculitis areas noted at multiple locations undergoing get the patient started on antibiotics and he is in agreement with this plan. 2. Also can recommend that we use the [facility] still for the main wound. 3. For some of the areas of weeping where the folliculitis is I would recommend the silver alginate dressing. 4. I did go ahead and send in a prescription for Bactrim DS I was going to send doxycycline but is going to be out in the sun the Bactrim the only thing he needs to do is make sure not to take his potassium while he is on this medication. We will see patient back for reevaluation in 1 week here in the clinic. If anything worsens or changes patient will contact our office for additional recommendations. Electronic Signature(s) Signed: 06/27/2023 1:47:01 PM By: Allen Derry PA-C Entered By: Allen Derry on 06/27/2023 13:47:01 -------------------------------------------------------------------------------- SuperBill Details Patient Name: Date of Service: [name] 06/27/2023 Medical Record Number: [id number] Patient Account Number: [id number] Date of Birth/Sex: Treating RN: 1946-03-27 (77 y.o. 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 I73.89 Other specified peripheral vascular diseases I48.0 Paroxysmal atrial fibrillation I10 Essential (primary) hypertension Facility Procedures : CPT4 Code: 78295621 Description:  17250 - CHEM CAUT GRANULATION TISS ICD-10 Diagnosis Description L97.822 Non-pressure chronic ulcer of other part of left lower leg with fat layer exposed Modifier: Quantity: 1 Physician Procedures : CPT4 Code Description Modifier [id number] 99214 - WC PHYS LEVEL 4 - EST PT 25 ICD-10 Diagnosis Description I89.0 Lymphedema, not elsewhere classified 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  L97.822 Non-pressure chronic ulcer of other part of left lower leg with fat layer exposed I73.89 Other specified peripheral vascular diseases Quantity: 1 : [id number] 17250 - WC PHYS CHEM CAUT GRAN TISSUE ICD-10 Diagnosis Description L97.822 Non-pressure chronic ulcer of other part of left lower leg with fat layer exposed Quantity: 1 Electronic Signature(s) Signed: 06/27/2023 1:48:30 PM By: Allen Derry PA-C Entered By: Allen Derry on 06/27/2023 13:48:30  [name], [name] ([id number]) [id number] Page 1 of 12 Visit Report for 06/27/2023 Chief Complaint Document Details Patient Name: Date of Service: [name] 06/27/2023 12:15 PM Medical Record Number: [id number] Patient Account Number: [id number] Date of Birth/Sex: Treating RN: [birth year] (77 y.o. [name] Primary Care Provider: [name] Other Clinician: Referring Provider: Treating Provider/Extender: [name] in Treatment: 6 Information Obtained from: Patient Chief Complaint Left LE Ulcer Electronic Signature(s) Signed: 06/27/2023 12:43:26 PM By: Allen Derry PA-C Entered By: Allen Derry on 06/27/2023 12:43:26 -------------------------------------------------------------------------------- HPI Details Patient Name: Date of Service: [name] 06/27/2023 12:15 PM Medical Record Number: [id number] Patient Account Number: [id number] Date of Birth/Sex: Treating RN: 11/03/1945 (77 y.o. [name] Primary Care Provider: [name] Other Clinician: Referring Provider: Treating Provider/Extender: [name] in Treatment: 6 History of Present Illness HPI Description: 77 year old male who has a past medical history of essential hypertension, chronic atrial fibrillation, peripheral vascular disease, nonischemic cardiomyopathy,venous stasis dermatitis, gouty arthropathy, basal cell carcinoma of the right lower extremity, benign prostatic hypertrophy, long- term use of anticoagulation therapy, hyperglycemia and exercise intolerance has never been a smoker. the patient has had a vascular workup over 7 years ago and said everything was normal at that stage. He does not have any chronic problems except for cardiac issues which he sees a cardiologist in [place]. 08/15/2017 -- arterial and venous duplex studies still pending. 08/23/2017 -- venous reflux studies done on 08/13/2017 shows  venous incompetence throughout the left lower extremity deep system and focally at the left saphenofemoral junction. 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 No evidence of SVT or DVT in bilateral lower extremities The patient has an appointment at the end of the month to get his arterial duplex study done 09/05/2017 -- the patient was seen at the vein and vascular office yesterday by Bary Castilla. ABI studies were notable for medial calcification and the toe brachial indices were normal and bilateral ankle-brachial) waveforms were normal with triphasic flow. After review of his venous studies he was not a candidate for laser ablation and his lymphedema was to be treated with compression stockings and lymphedema pump pumps 09/12/2017 -- had a low arterial study done at the [facility] -- unable to obtain reliable ABI is due to medial calcification. Bilateral toe [name] ([id number]) [id number] Page 2 of 12 indices were normal with the right being 1.01 and the left being 0.92 and the waveforms were triphasic bilaterally. he did get hold of 30-40 mm compression stockings but is unable to put these on. We will try and get him alternative compression stockings. 09/26/17- he is here in follow up evaluation of a right lower extremity ulcer;he is compliant in wearing compression stocking; ulcer almost epithelialized , anticipate healing next appointment Readmission: 11/17 point upon evaluation patient's wound currently that he is seeing [place] for today is a skin cancerous lesion that was cleared away by his dermatologist on the left medial calf region. He tells me that this is a very similar thing to what he had done previously in fact the last time he saw him in 2018 this was also what was going on at that point. Nonetheless he feels that based on what he seeing currently that this is just having a lot of harder time healing  although it is much closer to the surface than what he is experienced in the past. He notes  Allergies A Notifications Medication Indication Start End 06/27/2023 Bactrim DS DOSE 1 - oral 800 mg-160 mg tablet - 1 tablet oral twice a day x 14 days. Do not take your potassium while on this medication Electronic Signature(s) Signed: 06/27/2023 1:09:20 PM By: Allen Derry PA-C Entered By: Allen Derry on 06/27/2023 13:09:20 -------------------------------------------------------------------------------- Problem List Details Patient Name: Date of Service: [name] 06/27/2023 12:15 PM Medical Record Number: [id number] Patient Account Number: [id number] Date of Birth/Sex: Treating RN: Sep 11, 1946 (77 y.o. 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 06/27/2023 12:15 PM Medical Record Number: [id number] Patient Account Number: [id number] Date of Birth/Sex: Treating RN: 06-23-1946 (77 y.o. 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 the patient has had a vascular workup over 7 years ago and said everything was normal at that stage. He does not have any chronic problems except for cardiac issues which he sees a cardiologist in [place]. 08/15/2017 -- arterial and venous duplex studies still pending. SADIK, HASSETT [name] ([id number]) [id number] Page 8 of 12 08/23/2017 -- venous reflux studies done on 08/13/2017 shows venous incompetence throughout the left lower extremity deep system and focally at the left saphenofemoral junction. 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 No evidence of SVT or DVT in bilateral lower extremities The patient has an appointment at the end of the month to get his arterial duplex study done 09/05/2017 -- the patient was seen at the vein and vascular office yesterday by Bary Castilla. ABI studies were notable for medial calcification and the toe brachial indices were normal and bilateral ankle-brachial) waveforms were normal with triphasic flow. After review of his venous studies he was not a candidate for laser ablation and his lymphedema was to be treated with compression stockings and lymphedema pump pumps 09/12/2017 -- had a low arterial study done at the [facility] -- unable  to obtain reliable ABI is due to medial calcification. Bilateral toe indices were normal with the right being 1.01 and the left being 0.92 and the waveforms were triphasic bilaterally. he did get hold of 30-40 mm compression stockings but is unable to put these on. We will try and get him alternative compression stockings. 09/26/17- he is here in follow up evaluation  [name], [name] ([id number]) [id number] Page 1 of 12 Visit Report for 06/27/2023 Chief Complaint Document Details Patient Name: Date of Service: [name] 06/27/2023 12:15 PM Medical Record Number: [id number] Patient Account Number: [id number] Date of Birth/Sex: Treating RN: [birth year] (77 y.o. [name] Primary Care Provider: [name] Other Clinician: Referring Provider: Treating Provider/Extender: [name] in Treatment: 6 Information Obtained from: Patient Chief Complaint Left LE Ulcer Electronic Signature(s) Signed: 06/27/2023 12:43:26 PM By: Allen Derry PA-C Entered By: Allen Derry on 06/27/2023 12:43:26 -------------------------------------------------------------------------------- HPI Details Patient Name: Date of Service: [name] 06/27/2023 12:15 PM Medical Record Number: [id number] Patient Account Number: [id number] Date of Birth/Sex: Treating RN: 11/03/1945 (77 y.o. [name] Primary Care Provider: [name] Other Clinician: Referring Provider: Treating Provider/Extender: [name] in Treatment: 6 History of Present Illness HPI Description: 77 year old male who has a past medical history of essential hypertension, chronic atrial fibrillation, peripheral vascular disease, nonischemic cardiomyopathy,venous stasis dermatitis, gouty arthropathy, basal cell carcinoma of the right lower extremity, benign prostatic hypertrophy, long- term use of anticoagulation therapy, hyperglycemia and exercise intolerance has never been a smoker. the patient has had a vascular workup over 7 years ago and said everything was normal at that stage. He does not have any chronic problems except for cardiac issues which he sees a cardiologist in [place]. 08/15/2017 -- arterial and venous duplex studies still pending. 08/23/2017 -- venous reflux studies done on 08/13/2017 shows  venous incompetence throughout the left lower extremity deep system and focally at the left saphenofemoral junction. 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 No evidence of SVT or DVT in bilateral lower extremities The patient has an appointment at the end of the month to get his arterial duplex study done 09/05/2017 -- the patient was seen at the vein and vascular office yesterday by Bary Castilla. ABI studies were notable for medial calcification and the toe brachial indices were normal and bilateral ankle-brachial) waveforms were normal with triphasic flow. After review of his venous studies he was not a candidate for laser ablation and his lymphedema was to be treated with compression stockings and lymphedema pump pumps 09/12/2017 -- had a low arterial study done at the [facility] -- unable to obtain reliable ABI is due to medial calcification. Bilateral toe [name] ([id number]) [id number] Page 2 of 12 indices were normal with the right being 1.01 and the left being 0.92 and the waveforms were triphasic bilaterally. he did get hold of 30-40 mm compression stockings but is unable to put these on. We will try and get him alternative compression stockings. 09/26/17- he is here in follow up evaluation of a right lower extremity ulcer;he is compliant in wearing compression stocking; ulcer almost epithelialized , anticipate healing next appointment Readmission: 11/17 point upon evaluation patient's wound currently that he is seeing [place] for today is a skin cancerous lesion that was cleared away by his dermatologist on the left medial calf region. He tells me that this is a very similar thing to what he had done previously in fact the last time he saw him in 2018 this was also what was going on at that point. Nonetheless he feels that based on what he seeing currently that this is just having a lot of harder time healing  although it is much closer to the surface than what he is experienced in the past. He notes  Transfer Foam, 2.5x2.5 (in/in) 2 x Per Week/30 Days ary Discharge Instructions: Apply Hydrofera Blue Ready to wound bed as directed Secondary Dressing: Zetuvit Plus 4x8 (in/in) 2 x Per Week/30 Days Com pression Wrap: Urgo K2, two layer  compression system, regular 2 x Per Week/30 Days 1. Due to the folliculitis areas noted at multiple locations undergoing get the patient started on antibiotics and he is in agreement with this plan. 2. Also can recommend that we use the [facility] still for the main wound. 3. For some of the areas of weeping where the folliculitis is I would recommend the silver alginate dressing. 4. I did go ahead and send in a prescription for Bactrim DS I was going to send doxycycline but is going to be out in the sun the Bactrim the only thing he needs to do is make sure not to take his potassium while he is on this medication. We will see patient back for reevaluation in 1 week here in the clinic. If anything worsens or changes patient will contact our office for additional recommendations. Electronic Signature(s) Signed: 06/27/2023 1:47:01 PM By: Allen Derry PA-C Entered By: Allen Derry on 06/27/2023 13:47:01 -------------------------------------------------------------------------------- SuperBill Details Patient Name: Date of Service: [name] 06/27/2023 Medical Record Number: [id number] Patient Account Number: [id number] Date of Birth/Sex: Treating RN: 1946-03-27 (77 y.o. 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 I73.89 Other specified peripheral vascular diseases I48.0 Paroxysmal atrial fibrillation I10 Essential (primary) hypertension Facility Procedures : CPT4 Code: 78295621 Description:  17250 - CHEM CAUT GRANULATION TISS ICD-10 Diagnosis Description L97.822 Non-pressure chronic ulcer of other part of left lower leg with fat layer exposed Modifier: Quantity: 1 Physician Procedures : CPT4 Code Description Modifier [id number] 99214 - WC PHYS LEVEL 4 - EST PT 25 ICD-10 Diagnosis Description I89.0 Lymphedema, not elsewhere classified 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  L97.822 Non-pressure chronic ulcer of other part of left lower leg with fat layer exposed I73.89 Other specified peripheral vascular diseases Quantity: 1 : [id number] 17250 - WC PHYS CHEM CAUT GRAN TISSUE ICD-10 Diagnosis Description L97.822 Non-pressure chronic ulcer of other part of left lower leg with fat layer exposed Quantity: 1 Electronic Signature(s) Signed: 06/27/2023 1:48:30 PM By: Allen Derry PA-C Entered By: Allen Derry on 06/27/2023 13:48:30  [name], [name] ([id number]) [id number] Page 1 of 12 Visit Report for 06/27/2023 Chief Complaint Document Details Patient Name: Date of Service: [name] 06/27/2023 12:15 PM Medical Record Number: [id number] Patient Account Number: [id number] Date of Birth/Sex: Treating RN: [birth year] (77 y.o. [name] Primary Care Provider: [name] Other Clinician: Referring Provider: Treating Provider/Extender: [name] in Treatment: 6 Information Obtained from: Patient Chief Complaint Left LE Ulcer Electronic Signature(s) Signed: 06/27/2023 12:43:26 PM By: Allen Derry PA-C Entered By: Allen Derry on 06/27/2023 12:43:26 -------------------------------------------------------------------------------- HPI Details Patient Name: Date of Service: [name] 06/27/2023 12:15 PM Medical Record Number: [id number] Patient Account Number: [id number] Date of Birth/Sex: Treating RN: 11/03/1945 (77 y.o. [name] Primary Care Provider: [name] Other Clinician: Referring Provider: Treating Provider/Extender: [name] in Treatment: 6 History of Present Illness HPI Description: 77 year old male who has a past medical history of essential hypertension, chronic atrial fibrillation, peripheral vascular disease, nonischemic cardiomyopathy,venous stasis dermatitis, gouty arthropathy, basal cell carcinoma of the right lower extremity, benign prostatic hypertrophy, long- term use of anticoagulation therapy, hyperglycemia and exercise intolerance has never been a smoker. the patient has had a vascular workup over 7 years ago and said everything was normal at that stage. He does not have any chronic problems except for cardiac issues which he sees a cardiologist in [place]. 08/15/2017 -- arterial and venous duplex studies still pending. 08/23/2017 -- venous reflux studies done on 08/13/2017 shows  venous incompetence throughout the left lower extremity deep system and focally at the left saphenofemoral junction. 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 No evidence of SVT or DVT in bilateral lower extremities The patient has an appointment at the end of the month to get his arterial duplex study done 09/05/2017 -- the patient was seen at the vein and vascular office yesterday by Bary Castilla. ABI studies were notable for medial calcification and the toe brachial indices were normal and bilateral ankle-brachial) waveforms were normal with triphasic flow. After review of his venous studies he was not a candidate for laser ablation and his lymphedema was to be treated with compression stockings and lymphedema pump pumps 09/12/2017 -- had a low arterial study done at the [facility] -- unable to obtain reliable ABI is due to medial calcification. Bilateral toe [name] ([id number]) [id number] Page 2 of 12 indices were normal with the right being 1.01 and the left being 0.92 and the waveforms were triphasic bilaterally. he did get hold of 30-40 mm compression stockings but is unable to put these on. We will try and get him alternative compression stockings. 09/26/17- he is here in follow up evaluation of a right lower extremity ulcer;he is compliant in wearing compression stocking; ulcer almost epithelialized , anticipate healing next appointment Readmission: 11/17 point upon evaluation patient's wound currently that he is seeing [place] for today is a skin cancerous lesion that was cleared away by his dermatologist on the left medial calf region. He tells me that this is a very similar thing to what he had done previously in fact the last time he saw him in 2018 this was also what was going on at that point. Nonetheless he feels that based on what he seeing currently that this is just having a lot of harder time healing  although it is much closer to the surface than what he is experienced in the past. He notes  Transfer Foam, 2.5x2.5 (in/in) 2 x Per Week/30 Days ary Discharge Instructions: Apply Hydrofera Blue Ready to wound bed as directed Secondary Dressing: Zetuvit Plus 4x8 (in/in) 2 x Per Week/30 Days Com pression Wrap: Urgo K2, two layer  compression system, regular 2 x Per Week/30 Days 1. Due to the folliculitis areas noted at multiple locations undergoing get the patient started on antibiotics and he is in agreement with this plan. 2. Also can recommend that we use the [facility] still for the main wound. 3. For some of the areas of weeping where the folliculitis is I would recommend the silver alginate dressing. 4. I did go ahead and send in a prescription for Bactrim DS I was going to send doxycycline but is going to be out in the sun the Bactrim the only thing he needs to do is make sure not to take his potassium while he is on this medication. We will see patient back for reevaluation in 1 week here in the clinic. If anything worsens or changes patient will contact our office for additional recommendations. Electronic Signature(s) Signed: 06/27/2023 1:47:01 PM By: Allen Derry PA-C Entered By: Allen Derry on 06/27/2023 13:47:01 -------------------------------------------------------------------------------- SuperBill Details Patient Name: Date of Service: [name] 06/27/2023 Medical Record Number: [id number] Patient Account Number: [id number] Date of Birth/Sex: Treating RN: 1946-03-27 (77 y.o. 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 I73.89 Other specified peripheral vascular diseases I48.0 Paroxysmal atrial fibrillation I10 Essential (primary) hypertension Facility Procedures : CPT4 Code: [id number] Description:  17250 - CHEM CAUT GRANULATION TISS ICD-10 Diagnosis Description L97.822 Non-pressure chronic ulcer of other part of left lower leg with fat layer exposed Modifier: Quantity: 1 Physician Procedures : CPT4 Code Description Modifier [id number] - WC PHYS LEVEL 4 - EST PT 25 ICD-10 Diagnosis Description I89.0 Lymphedema, not elsewhere classified 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  L97.822 Non-pressure chronic ulcer of other part of left lower leg with fat layer exposed I73.89 Other specified peripheral vascular diseases Quantity: 1 : [id number] 17250 - WC PHYS CHEM CAUT GRAN TISSUE ICD-10 Diagnosis Description L97.822 Non-pressure chronic ulcer of other part of left lower leg with fat layer exposed Quantity: 1 Electronic Signature(s) Signed: 06/27/2023 1:48:30 PM By: Allen Derry PA-C Entered By: Allen Derry on 06/27/2023 13:48:30  Allergies A Notifications Medication Indication Start End 06/27/2023 Bactrim DS DOSE 1 - oral 800 mg-160 mg tablet - 1 tablet oral twice a day x 14 days. Do not take your potassium while on this medication Electronic Signature(s) Signed: 06/27/2023 1:09:20 PM By: Allen Derry PA-C Entered By: Allen Derry on 06/27/2023 13:09:20 -------------------------------------------------------------------------------- Problem List Details Patient Name: Date of Service: [name] 06/27/2023 12:15 PM Medical Record Number: [id number] Patient Account Number: [id number] Date of Birth/Sex: Treating RN: Sep 11, 1946 (77 y.o. 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 06/27/2023 12:15 PM Medical Record Number: [id number] Patient Account Number: [id number] Date of Birth/Sex: Treating RN: 06-23-1946 (77 y.o. 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 the patient has had a vascular workup over 7 years ago and said everything was normal at that stage. He does not have any chronic problems except for cardiac issues which he sees a cardiologist in [place]. 08/15/2017 -- arterial and venous duplex studies still pending. SADIK, HASSETT [name] ([id number]) [id number] Page 8 of 12 08/23/2017 -- venous reflux studies done on 08/13/2017 shows venous incompetence throughout the left lower extremity deep system and focally at the left saphenofemoral junction. 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 No evidence of SVT or DVT in bilateral lower extremities The patient has an appointment at the end of the month to get his arterial duplex study done 09/05/2017 -- the patient was seen at the vein and vascular office yesterday by Bary Castilla. ABI studies were notable for medial calcification and the toe brachial indices were normal and bilateral ankle-brachial) waveforms were normal with triphasic flow. After review of his venous studies he was not a candidate for laser ablation and his lymphedema was to be treated with compression stockings and lymphedema pump pumps 09/12/2017 -- had a low arterial study done at the [facility] -- unable  to obtain reliable ABI is due to medial calcification. Bilateral toe indices were normal with the right being 1.01 and the left being 0.92 and the waveforms were triphasic bilaterally. he did get hold of 30-40 mm compression stockings but is unable to put these on. We will try and get him alternative compression stockings. 09/26/17- he is here in follow up evaluation  Allergies A Notifications Medication Indication Start End 06/27/2023 Bactrim DS DOSE 1 - oral 800 mg-160 mg tablet - 1 tablet oral twice a day x 14 days. Do not take your potassium while on this medication Electronic Signature(s) Signed: 06/27/2023 1:09:20 PM By: Allen Derry PA-C Entered By: Allen Derry on 06/27/2023 13:09:20 -------------------------------------------------------------------------------- Problem List Details Patient Name: Date of Service: [name] 06/27/2023 12:15 PM Medical Record Number: [id number] Patient Account Number: [id number] Date of Birth/Sex: Treating RN: Sep 11, 1946 (77 y.o. 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 06/27/2023 12:15 PM Medical Record Number: [id number] Patient Account Number: [id number] Date of Birth/Sex: Treating RN: 06-23-1946 (77 y.o. 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 the patient has had a vascular workup over 7 years ago and said everything was normal at that stage. He does not have any chronic problems except for cardiac issues which he sees a cardiologist in [place]. 08/15/2017 -- arterial and venous duplex studies still pending. SADIK, HASSETT [name] ([id number]) [id number] Page 8 of 12 08/23/2017 -- venous reflux studies done on 08/13/2017 shows venous incompetence throughout the left lower extremity deep system and focally at the left saphenofemoral junction. 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 No evidence of SVT or DVT in bilateral lower extremities The patient has an appointment at the end of the month to get his arterial duplex study done 09/05/2017 -- the patient was seen at the vein and vascular office yesterday by Bary Castilla. ABI studies were notable for medial calcification and the toe brachial indices were normal and bilateral ankle-brachial) waveforms were normal with triphasic flow. After review of his venous studies he was not a candidate for laser ablation and his lymphedema was to be treated with compression stockings and lymphedema pump pumps 09/12/2017 -- had a low arterial study done at the [facility] -- unable  to obtain reliable ABI is due to medial calcification. Bilateral toe indices were normal with the right being 1.01 and the left being 0.92 and the waveforms were triphasic bilaterally. he did get hold of 30-40 mm compression stockings but is unable to put these on. We will try and get him alternative compression stockings. 09/26/17- he is here in follow up evaluation  [name], [name] ([id number]) [id number] Page 1 of 12 Visit Report for 06/27/2023 Chief Complaint Document Details Patient Name: Date of Service: [name] 06/27/2023 12:15 PM Medical Record Number: [id number] Patient Account Number: [id number] Date of Birth/Sex: Treating RN: [birth year] (77 y.o. [name] Primary Care Provider: [name] Other Clinician: Referring Provider: Treating Provider/Extender: [name] in Treatment: 6 Information Obtained from: Patient Chief Complaint Left LE Ulcer Electronic Signature(s) Signed: 06/27/2023 12:43:26 PM By: Allen Derry PA-C Entered By: Allen Derry on 06/27/2023 12:43:26 -------------------------------------------------------------------------------- HPI Details Patient Name: Date of Service: [name] 06/27/2023 12:15 PM Medical Record Number: [id number] Patient Account Number: [id number] Date of Birth/Sex: Treating RN: 11/03/1945 (77 y.o. [name] Primary Care Provider: [name] Other Clinician: Referring Provider: Treating Provider/Extender: [name] in Treatment: 6 History of Present Illness HPI Description: 77 year old male who has a past medical history of essential hypertension, chronic atrial fibrillation, peripheral vascular disease, nonischemic cardiomyopathy,venous stasis dermatitis, gouty arthropathy, basal cell carcinoma of the right lower extremity, benign prostatic hypertrophy, long- term use of anticoagulation therapy, hyperglycemia and exercise intolerance has never been a smoker. the patient has had a vascular workup over 7 years ago and said everything was normal at that stage. He does not have any chronic problems except for cardiac issues which he sees a cardiologist in [place]. 08/15/2017 -- arterial and venous duplex studies still pending. 08/23/2017 -- venous reflux studies done on 08/13/2017 shows  venous incompetence throughout the left lower extremity deep system and focally at the left saphenofemoral junction. 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 No evidence of SVT or DVT in bilateral lower extremities The patient has an appointment at the end of the month to get his arterial duplex study done 09/05/2017 -- the patient was seen at the vein and vascular office yesterday by Bary Castilla. ABI studies were notable for medial calcification and the toe brachial indices were normal and bilateral ankle-brachial) waveforms were normal with triphasic flow. After review of his venous studies he was not a candidate for laser ablation and his lymphedema was to be treated with compression stockings and lymphedema pump pumps 09/12/2017 -- had a low arterial study done at the [facility] -- unable to obtain reliable ABI is due to medial calcification. Bilateral toe [name] ([id number]) [id number] Page 2 of 12 indices were normal with the right being 1.01 and the left being 0.92 and the waveforms were triphasic bilaterally. he did get hold of 30-40 mm compression stockings but is unable to put these on. We will try and get him alternative compression stockings. 09/26/17- he is here in follow up evaluation of a right lower extremity ulcer;he is compliant in wearing compression stocking; ulcer almost epithelialized , anticipate healing next appointment Readmission: 11/17 point upon evaluation patient's wound currently that he is seeing [place] for today is a skin cancerous lesion that was cleared away by his dermatologist on the left medial calf region. He tells me that this is a very similar thing to what he had done previously in fact the last time he saw him in 2018 this was also what was going on at that point. Nonetheless he feels that based on what he seeing currently that this is just having a lot of harder time healing  although it is much closer to the surface than what he is experienced in the past. He notes  Allergies A Notifications Medication Indication Start End 06/27/2023 Bactrim DS DOSE 1 - oral 800 mg-160 mg tablet - 1 tablet oral twice a day x 14 days. Do not take your potassium while on this medication Electronic Signature(s) Signed: 06/27/2023 1:09:20 PM By: Allen Derry PA-C Entered By: Allen Derry on 06/27/2023 13:09:20 -------------------------------------------------------------------------------- Problem List Details Patient Name: Date of Service: [name] 06/27/2023 12:15 PM Medical Record Number: [id number] Patient Account Number: [id number] Date of Birth/Sex: Treating RN: Sep 11, 1946 (77 y.o. 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 06/27/2023 12:15 PM Medical Record Number: [id number] Patient Account Number: [id number] Date of Birth/Sex: Treating RN: 06-23-1946 (77 y.o. 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 the patient has had a vascular workup over 7 years ago and said everything was normal at that stage. He does not have any chronic problems except for cardiac issues which he sees a cardiologist in [place]. 08/15/2017 -- arterial and venous duplex studies still pending. SADIK, HASSETT [name] ([id number]) [id number] Page 8 of 12 08/23/2017 -- venous reflux studies done on 08/13/2017 shows venous incompetence throughout the left lower extremity deep system and focally at the left saphenofemoral junction. 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 No evidence of SVT or DVT in bilateral lower extremities The patient has an appointment at the end of the month to get his arterial duplex study done 09/05/2017 -- the patient was seen at the vein and vascular office yesterday by Bary Castilla. ABI studies were notable for medial calcification and the toe brachial indices were normal and bilateral ankle-brachial) waveforms were normal with triphasic flow. After review of his venous studies he was not a candidate for laser ablation and his lymphedema was to be treated with compression stockings and lymphedema pump pumps 09/12/2017 -- had a low arterial study done at the [facility] -- unable  to obtain reliable ABI is due to medial calcification. Bilateral toe indices were normal with the right being 1.01 and the left being 0.92 and the waveforms were triphasic bilaterally. he did get hold of 30-40 mm compression stockings but is unable to put these on. We will try and get him alternative compression stockings. 09/26/17- he is here in follow up evaluation  Allergies A Notifications Medication Indication Start End 06/27/2023 Bactrim DS DOSE 1 - oral 800 mg-160 mg tablet - 1 tablet oral twice a day x 14 days. Do not take your potassium while on this medication Electronic Signature(s) Signed: 06/27/2023 1:09:20 PM By: Allen Derry PA-C Entered By: Allen Derry on 06/27/2023 13:09:20 -------------------------------------------------------------------------------- Problem List Details Patient Name: Date of Service: [name] 06/27/2023 12:15 PM Medical Record Number: [id number] Patient Account Number: [id number] Date of Birth/Sex: Treating RN: Sep 11, 1946 (77 y.o. 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 06/27/2023 12:15 PM Medical Record Number: [id number] Patient Account Number: [id number] Date of Birth/Sex: Treating RN: 06-23-1946 (77 y.o. 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 the patient has had a vascular workup over 7 years ago and said everything was normal at that stage. He does not have any chronic problems except for cardiac issues which he sees a cardiologist in [place]. 08/15/2017 -- arterial and venous duplex studies still pending. SADIK, HASSETT [name] ([id number]) [id number] Page 8 of 12 08/23/2017 -- venous reflux studies done on 08/13/2017 shows venous incompetence throughout the left lower extremity deep system and focally at the left saphenofemoral junction. 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 No evidence of SVT or DVT in bilateral lower extremities The patient has an appointment at the end of the month to get his arterial duplex study done 09/05/2017 -- the patient was seen at the vein and vascular office yesterday by Bary Castilla. ABI studies were notable for medial calcification and the toe brachial indices were normal and bilateral ankle-brachial) waveforms were normal with triphasic flow. After review of his venous studies he was not a candidate for laser ablation and his lymphedema was to be treated with compression stockings and lymphedema pump pumps 09/12/2017 -- had a low arterial study done at the [facility] -- unable  to obtain reliable ABI is due to medial calcification. Bilateral toe indices were normal with the right being 1.01 and the left being 0.92 and the waveforms were triphasic bilaterally. he did get hold of 30-40 mm compression stockings but is unable to put these on. We will try and get him alternative compression stockings. 09/26/17- he is here in follow up evaluation  Allergies A Notifications Medication Indication Start End 06/27/2023 Bactrim DS DOSE 1 - oral 800 mg-160 mg tablet - 1 tablet oral twice a day x 14 days. Do not take your potassium while on this medication Electronic Signature(s) Signed: 06/27/2023 1:09:20 PM By: Allen Derry PA-C Entered By: Allen Derry on 06/27/2023 13:09:20 -------------------------------------------------------------------------------- Problem List Details Patient Name: Date of Service: [name] 06/27/2023 12:15 PM Medical Record Number: [id number] Patient Account Number: [id number] Date of Birth/Sex: Treating RN: Sep 11, 1946 (77 y.o. 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 06/27/2023 12:15 PM Medical Record Number: [id number] Patient Account Number: [id number] Date of Birth/Sex: Treating RN: 06-23-1946 (77 y.o. 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 the patient has had a vascular workup over 7 years ago and said everything was normal at that stage. He does not have any chronic problems except for cardiac issues which he sees a cardiologist in [place]. 08/15/2017 -- arterial and venous duplex studies still pending. SADIK, HASSETT [name] ([id number]) [id number] Page 8 of 12 08/23/2017 -- venous reflux studies done on 08/13/2017 shows venous incompetence throughout the left lower extremity deep system and focally at the left saphenofemoral junction. 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 No evidence of SVT or DVT in bilateral lower extremities The patient has an appointment at the end of the month to get his arterial duplex study done 09/05/2017 -- the patient was seen at the vein and vascular office yesterday by Bary Castilla. ABI studies were notable for medial calcification and the toe brachial indices were normal and bilateral ankle-brachial) waveforms were normal with triphasic flow. After review of his venous studies he was not a candidate for laser ablation and his lymphedema was to be treated with compression stockings and lymphedema pump pumps 09/12/2017 -- had a low arterial study done at the [facility] -- unable  to obtain reliable ABI is due to medial calcification. Bilateral toe indices were normal with the right being 1.01 and the left being 0.92 and the waveforms were triphasic bilaterally. he did get hold of 30-40 mm compression stockings but is unable to put these on. We will try and get him alternative compression stockings. 09/26/17- he is here in follow up evaluation  Allergies A Notifications Medication Indication Start End 06/27/2023 Bactrim DS DOSE 1 - oral 800 mg-160 mg tablet - 1 tablet oral twice a day x 14 days. Do not take your potassium while on this medication Electronic Signature(s) Signed: 06/27/2023 1:09:20 PM By: Allen Derry PA-C Entered By: Allen Derry on 06/27/2023 13:09:20 -------------------------------------------------------------------------------- Problem List Details Patient Name: Date of Service: [name] 06/27/2023 12:15 PM Medical Record Number: [id number] Patient Account Number: [id number] Date of Birth/Sex: Treating RN: Sep 11, 1946 (77 y.o. 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 06/27/2023 12:15 PM Medical Record Number: [id number] Patient Account Number: [id number] Date of Birth/Sex: Treating RN: 06-23-1946 (77 y.o. 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 the patient has had a vascular workup over 7 years ago and said everything was normal at that stage. He does not have any chronic problems except for cardiac issues which he sees a cardiologist in [place]. 08/15/2017 -- arterial and venous duplex studies still pending. SADIK, HASSETT [name] ([id number]) [id number] Page 8 of 12 08/23/2017 -- venous reflux studies done on 08/13/2017 shows venous incompetence throughout the left lower extremity deep system and focally at the left saphenofemoral junction. 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 No evidence of SVT or DVT in bilateral lower extremities The patient has an appointment at the end of the month to get his arterial duplex study done 09/05/2017 -- the patient was seen at the vein and vascular office yesterday by Bary Castilla. ABI studies were notable for medial calcification and the toe brachial indices were normal and bilateral ankle-brachial) waveforms were normal with triphasic flow. After review of his venous studies he was not a candidate for laser ablation and his lymphedema was to be treated with compression stockings and lymphedema pump pumps 09/12/2017 -- had a low arterial study done at the [facility] -- unable  to obtain reliable ABI is due to medial calcification. Bilateral toe indices were normal with the right being 1.01 and the left being 0.92 and the waveforms were triphasic bilaterally. he did get hold of 30-40 mm compression stockings but is unable to put these on. We will try and get him alternative compression stockings. 09/26/17- he is here in follow up evaluation  Transfer Foam, 2.5x2.5 (in/in) 2 x Per Week/30 Days ary Discharge Instructions: Apply Hydrofera Blue Ready to wound bed as directed Secondary Dressing: Zetuvit Plus 4x8 (in/in) 2 x Per Week/30 Days Com pression Wrap: Urgo K2, two layer  compression system, regular 2 x Per Week/30 Days 1. Due to the folliculitis areas noted at multiple locations undergoing get the patient started on antibiotics and he is in agreement with this plan. 2. Also can recommend that we use the [facility] still for the main wound. 3. For some of the areas of weeping where the folliculitis is I would recommend the silver alginate dressing. 4. I did go ahead and send in a prescription for Bactrim DS I was going to send doxycycline but is going to be out in the sun the Bactrim the only thing he needs to do is make sure not to take his potassium while he is on this medication. We will see patient back for reevaluation in 1 week here in the clinic. If anything worsens or changes patient will contact our office for additional recommendations. Electronic Signature(s) Signed: 06/27/2023 1:47:01 PM By: Allen Derry PA-C Entered By: Allen Derry on 06/27/2023 13:47:01 -------------------------------------------------------------------------------- SuperBill Details Patient Name: Date of Service: [name] 06/27/2023 Medical Record Number: [id number] Patient Account Number: [id number] Date of Birth/Sex: Treating RN: 1946-03-27 (77 y.o. 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 I73.89 Other specified peripheral vascular diseases I48.0 Paroxysmal atrial fibrillation I10 Essential (primary) hypertension Facility Procedures : CPT4 Code: [id number] Description:  17250 - CHEM CAUT GRANULATION TISS ICD-10 Diagnosis Description L97.822 Non-pressure chronic ulcer of other part of left lower leg with fat layer exposed Modifier: Quantity: 1 Physician Procedures : CPT4 Code Description Modifier [id number] - WC PHYS LEVEL 4 - EST PT 25 ICD-10 Diagnosis Description I89.0 Lymphedema, not elsewhere classified 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  L97.822 Non-pressure chronic ulcer of other part of left lower leg with fat layer exposed I73.89 Other specified peripheral vascular diseases Quantity: 1 : [id number] 17250 - WC PHYS CHEM CAUT GRAN TISSUE ICD-10 Diagnosis Description L97.822 Non-pressure chronic ulcer of other part of left lower leg with fat layer exposed Quantity: 1 Electronic Signature(s) Signed: 06/27/2023 1:48:30 PM By: Allen Derry PA-C Entered By: Allen Derry on 06/27/2023 13:48:30  [name], [name] ([id number]) [id number] Page 1 of 12 Visit Report for 06/27/2023 Chief Complaint Document Details Patient Name: Date of Service: [name] 06/27/2023 12:15 PM Medical Record Number: [id number] Patient Account Number: [id number] Date of Birth/Sex: Treating RN: [birth year] (77 y.o. [name] Primary Care Provider: [name] Other Clinician: Referring Provider: Treating Provider/Extender: [name] in Treatment: 6 Information Obtained from: Patient Chief Complaint Left LE Ulcer Electronic Signature(s) Signed: 06/27/2023 12:43:26 PM By: Allen Derry PA-C Entered By: Allen Derry on 06/27/2023 12:43:26 -------------------------------------------------------------------------------- HPI Details Patient Name: Date of Service: [name] 06/27/2023 12:15 PM Medical Record Number: [id number] Patient Account Number: [id number] Date of Birth/Sex: Treating RN: 11/03/1945 (77 y.o. [name] Primary Care Provider: [name] Other Clinician: Referring Provider: Treating Provider/Extender: [name] in Treatment: 6 History of Present Illness HPI Description: 77 year old male who has a past medical history of essential hypertension, chronic atrial fibrillation, peripheral vascular disease, nonischemic cardiomyopathy,venous stasis dermatitis, gouty arthropathy, basal cell carcinoma of the right lower extremity, benign prostatic hypertrophy, long- term use of anticoagulation therapy, hyperglycemia and exercise intolerance has never been a smoker. the patient has had a vascular workup over 7 years ago and said everything was normal at that stage. He does not have any chronic problems except for cardiac issues which he sees a cardiologist in [place]. 08/15/2017 -- arterial and venous duplex studies still pending. 08/23/2017 -- venous reflux studies done on 08/13/2017 shows  venous incompetence throughout the left lower extremity deep system and focally at the left saphenofemoral junction. 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 No evidence of SVT or DVT in bilateral lower extremities The patient has an appointment at the end of the month to get his arterial duplex study done 09/05/2017 -- the patient was seen at the vein and vascular office yesterday by Bary Castilla. ABI studies were notable for medial calcification and the toe brachial indices were normal and bilateral ankle-brachial) waveforms were normal with triphasic flow. After review of his venous studies he was not a candidate for laser ablation and his lymphedema was to be treated with compression stockings and lymphedema pump pumps 09/12/2017 -- had a low arterial study done at the [facility] -- unable to obtain reliable ABI is due to medial calcification. Bilateral toe [name] ([id number]) [id number] Page 2 of 12 indices were normal with the right being 1.01 and the left being 0.92 and the waveforms were triphasic bilaterally. he did get hold of 30-40 mm compression stockings but is unable to put these on. We will try and get him alternative compression stockings. 09/26/17- he is here in follow up evaluation of a right lower extremity ulcer;he is compliant in wearing compression stocking; ulcer almost epithelialized , anticipate healing next appointment Readmission: 11/17 point upon evaluation patient's wound currently that he is seeing [place] for today is a skin cancerous lesion that was cleared away by his dermatologist on the left medial calf region. He tells me that this is a very similar thing to what he had done previously in fact the last time he saw him in 2018 this was also what was going on at that point. Nonetheless he feels that based on what he seeing currently that this is just having a lot of harder time healing  although it is much closer to the surface than what he is experienced in the past. He notes

## 2023-06-28 NOTE — Progress Notes (Signed)
[name] R. 06/27/2023 12:15 PM Medical Record Number: [id number] Patient Account Number: [id number] Date of Birth/Sex: Treating RN: 03-Jul-1946 (77 y.o. [name] Primary Care Ashwika Freels: [name] Other Clinician: Referring English Craighead: Treating Natavia Sublette/Extender: [name] in Treatment: 6 Vital Signs Time Taken: 12:24 Temperature (F): 97.6 Height (in): 75 Pulse (bpm): 53 Weight (lbs): 220 Respiratory Rate (breaths/min): 18 Body Mass Index (BMI): 27.5 Blood Pressure (mmHg): 153/74 Reference Range: 80 - 120 mg / dl Electronic Signature(s) Signed: 06/27/2023 5:11:11 PM By: [name] MSN RN CNS WTA Entered By: [name] on 06/27/2023 12:28:17  [name], [name] ([id number]) [id number] Page 1 of 6 Visit Report for 06/27/2023 Arrival Information Details Patient Name: Date of Service: [name] 06/27/2023 12:15 PM Medical Record Number: [id number] Patient Account Number: [id number] Date of Birth/Sex: Treating RN: 01/17/1946 (77 y.o. [name] Primary Care Jovann Luse: [name] Other Clinician: Referring Annett Boxwell: Treating Monta Maiorana/Extender: [name] in Treatment: 6 Visit Information History Since Last Visit Added or deleted any medications: No Patient Arrived: Ambulatory Any new allergies or adverse reactions: No Arrival Time: 12:22 Has Dressing in Place as Prescribed: Yes Accompanied By: self Has Compression in Place as Prescribed: Yes Transfer Assistance: None Pain Present Now: No Patient Identification Verified: Yes Secondary Verification Process Completed: Yes Patient Requires Transmission-Based Precautions: No Patient Has Alerts: No Electronic Signature(s) Signed: 06/27/2023 5:11:11 PM By: [name] MSN RN CNS WTA Entered By: [name] on 06/27/2023 12:24:34 -------------------------------------------------------------------------------- Encounter Discharge Information Details Patient Name: Date of Service: [name] [name] [name] R. 06/27/2023 12:15 PM Medical Record Number: [id number] Patient Account Number: [id number] Date of Birth/Sex: Treating RN: 06-Feb-1946 (77 y.o. [name] Primary Care Sheryl Saintil: [name] Other Clinician: Referring Nareh Matzke: Treating Benjamen Koelling/Extender: [name] in Treatment: 6 Encounter Discharge Information Items Discharge Condition: Stable Ambulatory Status: Ambulatory Discharge Destination: Home Transportation: Private Auto Accompanied By: self Schedule Follow-up Appointment: Yes Clinical Summary of Care: Electronic Signature(s) Signed: 06/27/2023 5:11:11 PM By: [name] MSN RN  [facility] [address] [place] R ([id number]) [id number] Page 2 of 6 Entered By: [name] on 06/27/2023 13:17:32 -------------------------------------------------------------------------------- Lower Extremity Assessment Details Patient Name: Date of Service: [name] [name] Corner R. 06/27/2023 12:15 PM Medical Record Number: [id number] Patient Account Number: [id number] Date of Birth/Sex: Treating RN: 07-10-1946 (77 y.o. [name] Primary Care Javaris Wigington: [name] Other Clinician: Referring Courtni Balash: Treating Saudia Smyser/Extender: [name] in Treatment: 6 Edema Assessment Assessed: Kyra Searles: No] Franne Forts: No] [Left: Edema] [Right: :] Calf Left: Right: Point of Measurement: 37 cm From Medial Instep 35 cm Ankle Left: Right: Point of Measurement: 12 cm From Medial Instep 22.5 cm Vascular Assessment Pulses: Dorsalis Pedis Palpable: [Left:Yes] Extremity colors, hair growth, and conditions: Extremity Color: [Left:Red] Hair Growth on Extremity: [Left:No] Temperature of Extremity: [Left:Warm] Capillary Refill: [Left:< 3 seconds] Dependent Rubor: [Left:No] Blanched when Elevated: [Left:No No] Toe Nail Assessment Left: Right: Thick: No Discolored: No Deformed: No Improper Length and Hygiene: No Electronic Signature(s) Signed: 06/27/2023 5:11:11 PM By: [name] MSN RN CNS WTA Entered By: [name] on 06/27/2023 12:37:41 Shawnee Knapp R ([id number]) [id number] Page 3 of 6 -------------------------------------------------------------------------------- Multi Wound Chart Details Patient Name: Date of Service: [name] [name] Corner R. 06/27/2023 12:15 PM Medical Record Number: [id number] Patient Account Number: [id number] Date of Birth/Sex: Treating RN: 10/04/1946 (77 y.o. [name] Primary Care Keisi Eckford: [name] Other Clinician: Referring Daijah Scrivens: Treating Auna Mikkelsen/Extender: [name] in Treatment: 6 Vital Signs Height(in): 75 Pulse(bpm): 53 Weight(lbs): 220 Blood Pressure(mmHg): 153/74 Body Mass Index(BMI): 27.5 Temperature(F): 97.6 Respiratory Rate(breaths/min): 18 [21:Photos:] [N/[name]:N/[name]] Left, Lateral Lower Leg N/[name] N/[name] Wound Location: Gradually Appeared N/[name] N/[name] Wounding Event: Vasculitis N/[name] N/[name] Primary Etiology: Arrhythmia, Hypertension, Gout N/[name] N/[name] Comorbid History: 04/08/2023 N/[name] N/[name] Date Acquired: 6 N/[name] N/[name] Weeks of Treatment: Open N/[name] N/[name] Wound Status: No N/[name] N/[name] Wound Recurrence: 2.5x1.4x0.1 N/[name] N/[name] Measurements L x W x D (cm) 2.749 N/[name] N/[name] [name] (cm) : rea 0.275 N/[name] N/[name] Volume (cm) : 80.00% N/[name] N/[name] %  [name], [name] ([id number]) [id number] Page 1 of 6 Visit Report for 06/27/2023 Arrival Information Details Patient Name: Date of Service: [name] 06/27/2023 12:15 PM Medical Record Number: [id number] Patient Account Number: [id number] Date of Birth/Sex: Treating RN: 01/17/1946 (77 y.o. [name] Primary Care Jovann Luse: [name] Other Clinician: Referring Annett Boxwell: Treating Monta Maiorana/Extender: [name] in Treatment: 6 Visit Information History Since Last Visit Added or deleted any medications: No Patient Arrived: Ambulatory Any new allergies or adverse reactions: No Arrival Time: 12:22 Has Dressing in Place as Prescribed: Yes Accompanied By: self Has Compression in Place as Prescribed: Yes Transfer Assistance: None Pain Present Now: No Patient Identification Verified: Yes Secondary Verification Process Completed: Yes Patient Requires Transmission-Based Precautions: No Patient Has Alerts: No Electronic Signature(s) Signed: 06/27/2023 5:11:11 PM By: [name] MSN RN CNS WTA Entered By: [name] on 06/27/2023 12:24:34 -------------------------------------------------------------------------------- Encounter Discharge Information Details Patient Name: Date of Service: [name] [name] [name] R. 06/27/2023 12:15 PM Medical Record Number: [id number] Patient Account Number: [id number] Date of Birth/Sex: Treating RN: 06-Feb-1946 (77 y.o. [name] Primary Care Sheryl Saintil: [name] Other Clinician: Referring Nareh Matzke: Treating Benjamen Koelling/Extender: [name] in Treatment: 6 Encounter Discharge Information Items Discharge Condition: Stable Ambulatory Status: Ambulatory Discharge Destination: Home Transportation: Private Auto Accompanied By: self Schedule Follow-up Appointment: Yes Clinical Summary of Care: Electronic Signature(s) Signed: 06/27/2023 5:11:11 PM By: [name] MSN RN  [facility] [address] [place] R ([id number]) [id number] Page 2 of 6 Entered By: [name] on 06/27/2023 13:17:32 -------------------------------------------------------------------------------- Lower Extremity Assessment Details Patient Name: Date of Service: [name] [name] Corner R. 06/27/2023 12:15 PM Medical Record Number: [id number] Patient Account Number: [id number] Date of Birth/Sex: Treating RN: 07-10-1946 (77 y.o. [name] Primary Care Javaris Wigington: [name] Other Clinician: Referring Courtni Balash: Treating Saudia Smyser/Extender: [name] in Treatment: 6 Edema Assessment Assessed: Kyra Searles: No] Franne Forts: No] [Left: Edema] [Right: :] Calf Left: Right: Point of Measurement: 37 cm From Medial Instep 35 cm Ankle Left: Right: Point of Measurement: 12 cm From Medial Instep 22.5 cm Vascular Assessment Pulses: Dorsalis Pedis Palpable: [Left:Yes] Extremity colors, hair growth, and conditions: Extremity Color: [Left:Red] Hair Growth on Extremity: [Left:No] Temperature of Extremity: [Left:Warm] Capillary Refill: [Left:< 3 seconds] Dependent Rubor: [Left:No] Blanched when Elevated: [Left:No No] Toe Nail Assessment Left: Right: Thick: No Discolored: No Deformed: No Improper Length and Hygiene: No Electronic Signature(s) Signed: 06/27/2023 5:11:11 PM By: [name] MSN RN CNS WTA Entered By: [name] on 06/27/2023 12:37:41 Shawnee Knapp R ([id number]) [id number] Page 3 of 6 -------------------------------------------------------------------------------- Multi Wound Chart Details Patient Name: Date of Service: [name] [name] Corner R. 06/27/2023 12:15 PM Medical Record Number: [id number] Patient Account Number: [id number] Date of Birth/Sex: Treating RN: 10/04/1946 (77 y.o. [name] Primary Care Keisi Eckford: [name] Other Clinician: Referring Daijah Scrivens: Treating Auna Mikkelsen/Extender: [name] in Treatment: 6 Vital Signs Height(in): 75 Pulse(bpm): 53 Weight(lbs): 220 Blood Pressure(mmHg): 153/74 Body Mass Index(BMI): 27.5 Temperature(F): 97.6 Respiratory Rate(breaths/min): 18 [21:Photos:] [N/[name]:N/[name]] Left, Lateral Lower Leg N/[name] N/[name] Wound Location: Gradually Appeared N/[name] N/[name] Wounding Event: Vasculitis N/[name] N/[name] Primary Etiology: Arrhythmia, Hypertension, Gout N/[name] N/[name] Comorbid History: 04/08/2023 N/[name] N/[name] Date Acquired: 6 N/[name] N/[name] Weeks of Treatment: Open N/[name] N/[name] Wound Status: No N/[name] N/[name] Wound Recurrence: 2.5x1.4x0.1 N/[name] N/[name] Measurements L x W x D (cm) 2.749 N/[name] N/[name] [name] (cm) : rea 0.275 N/[name] N/[name] Volume (cm) : 80.00% N/[name] N/[name] %

## 2023-07-04 ENCOUNTER — Encounter: Payer: Medicare PPO | Admitting: Physician Assistant

## 2023-07-04 DIAGNOSIS — I7389 Other specified peripheral vascular diseases: Secondary | ICD-10-CM | POA: Diagnosis not present

## 2023-07-04 DIAGNOSIS — I87332 Chronic venous hypertension (idiopathic) with ulcer and inflammation of left lower extremity: Secondary | ICD-10-CM | POA: Diagnosis not present

## 2023-07-04 DIAGNOSIS — I87333 Chronic venous hypertension (idiopathic) with ulcer and inflammation of bilateral lower extremity: Secondary | ICD-10-CM | POA: Diagnosis not present

## 2023-07-04 DIAGNOSIS — I480 Paroxysmal atrial fibrillation: Secondary | ICD-10-CM | POA: Diagnosis not present

## 2023-07-04 DIAGNOSIS — L97822 Non-pressure chronic ulcer of other part of left lower leg with fat layer exposed: Secondary | ICD-10-CM | POA: Diagnosis not present

## 2023-07-04 DIAGNOSIS — I428 Other cardiomyopathies: Secondary | ICD-10-CM | POA: Diagnosis not present

## 2023-07-04 DIAGNOSIS — I10 Essential (primary) hypertension: Secondary | ICD-10-CM | POA: Diagnosis not present

## 2023-07-04 DIAGNOSIS — Z7901 Long term (current) use of anticoagulants: Secondary | ICD-10-CM | POA: Diagnosis not present

## 2023-07-04 DIAGNOSIS — I890 Lymphedema, not elsewhere classified: Secondary | ICD-10-CM | POA: Diagnosis not present

## 2023-07-04 DIAGNOSIS — I482 Chronic atrial fibrillation, unspecified: Secondary | ICD-10-CM | POA: Diagnosis not present

## 2023-07-04 NOTE — Progress Notes (Signed)
Amount: Fat Layer (Subcutaneous Tissue): Yes N/[name] N/[name] Exposed Structures: Fascia: No Tendon: No Muscle: No Joint: No Bone: No Medium (34-66%) N/[name] N/[name] Epithelialization: Treatment Notes Electronic Signature(s) Signed: 07/04/2023 4:49:27 PM By: Angelina Pih Entered By: Angelina Pih on 07/04/2023 09:27:09 -------------------------------------------------------------------------------- Multi-Disciplinary Care Plan Details Patient Name: Date of Service: [name], [name] 07/04/2023 12:00 PM Medical Record Number: [id number] Patient Account Number: [id number] Date of Birth/Sex: Treating RN: Dec 01, 1945 (77 y.o. [name] Primary Care Shenay Torti: [name] Other Clinician: Referring Wilburt Messina: Treating Ryenn Howeth/Extender: [name] in Treatment: 7 Active Inactive Wound/Skin Impairment Nursing Diagnoses: Knowledge deficit related to ulceration/compromised skin integrity Goals: Patient/caregiver will  verbalize understanding of skin care regimen Date Initiated: 05/16/2023 Target Resolution Date: 07/16/2023 Goal Status: Active Ulcer/skin breakdown will have [name] volume reduction of 30% by week 4 Date Initiated: 05/16/2023 Date Inactivated: 06/20/2023 Target Resolution Date: 06/16/2023 Goal Status: Met Ulcer/skin breakdown will have [name] volume reduction of 50% by week 8 Date Initiated: 05/16/2023 Target Resolution Date: 07/16/2023 Goal Status: Active Ulcer/skin breakdown will have [name] volume reduction of 80% by week 12 Date Initiated: 05/16/2023 Target Resolution Date: 08/16/2023 [name], [name] ([id number]) [id number] Page 6 of 9 Goal Status: Active Ulcer/skin breakdown will heal within 14 weeks Date Initiated: 05/16/2023 Target Resolution Date: 09/15/2023 Goal Status: Active Interventions: Assess patient/caregiver ability to obtain necessary supplies Assess patient/caregiver ability to perform ulcer/skin care regimen upon admission and as needed Assess ulceration(s) every visit Notes: Electronic Signature(s) Signed: 07/04/2023 4:49:27 PM By: Angelina Pih Entered By: Angelina Pih on 07/04/2023 09:38:17 -------------------------------------------------------------------------------- Pain Assessment Details Patient Name: Date of Service: [name], [name] 07/04/2023 12:00 PM Medical Record Number: [id number] Patient Account Number: [id number] Date of Birth/Sex: Treating RN: 04-08-1946 (77 y.o. [name] Primary Care Lash Matulich: [name] Other Clinician: Referring Avigayil Ton: Treating Zoelle Markus/Extender: [name] in Treatment: 7 Active Problems Location of Pain Severity and Description of Pain Patient Has Paino No Site Locations Rate the pain. Current Pain Level: 0 Pain Management and Medication Current Pain Management: Electronic Signature(s) Signed: 07/04/2023 4:49:27 PM By: Angelina Pih Entered By: Angelina Pih on  07/04/2023 09:07:29 [name], [name] ([id number]) [id number] Page 7 of 9 -------------------------------------------------------------------------------- Patient/Caregiver Education Details Patient Name: Date of Service: [name] 9/26/2024andnbsp12:00 PM Medical Record Number: [id number] Patient Account Number: [id number] Date of Birth/Gender: Treating RN: [birth year] (77 y.o. [name] Primary Care Physician: [name] Other Clinician: Referring Physician: Treating Physician/Extender: [name] in Treatment: 7 Education Assessment Education Provided To: Patient Education Topics Provided Wound/Skin Impairment: Handouts: Caring for Your Ulcer Methods: Explain/Verbal Responses: State content correctly Electronic Signature(s) Signed: 07/04/2023 4:49:27 PM By: Angelina Pih Entered By: Angelina Pih on 07/04/2023 09:38:26 -------------------------------------------------------------------------------- Wound Assessment Details Patient Name: Date of Service: [name] [name], [name] 07/04/2023 12:00 PM Medical Record Number: [id number] Patient Account Number: [id number] Date of Birth/Sex: Treating RN: May 18, 1946 (77 y.o. [name] Primary Care Keon Pender: [name] Other Clinician: Referring Goku Harb: Treating Zoye Chandra/Extender: [name] in Treatment: 7 Wound Status Wound Number: 21 Primary Etiology: Vasculitis Wound Location: Left, Lateral Lower Leg Wound Status: Open Wounding Event: Gradually Appeared Comorbid History: Arrhythmia, Hypertension, Gout Date Acquired: 04/08/2023 Weeks Of Treatment: 7 Clustered Wound: No Photos [name], [name] ([id number]) [id number] Page 8 of 9 Wound Measurements Length: (cm) 1.6 Width: (cm) 0.9 Depth: (cm) 0.1 Area: (cm) 1.131 Volume: (cm) 0.113 % Reduction in Area: 91.8% %  Amount: Fat Layer (Subcutaneous Tissue): Yes N/[name] N/[name] Exposed Structures: Fascia: No Tendon: No Muscle: No Joint: No Bone: No Medium (34-66%) N/[name] N/[name] Epithelialization: Treatment Notes Electronic Signature(s) Signed: 07/04/2023 4:49:27 PM By: Angelina Pih Entered By: Angelina Pih on 07/04/2023 09:27:09 -------------------------------------------------------------------------------- Multi-Disciplinary Care Plan Details Patient Name: Date of Service: [name], [name] 07/04/2023 12:00 PM Medical Record Number: [id number] Patient Account Number: [id number] Date of Birth/Sex: Treating RN: Dec 01, 1945 (77 y.o. [name] Primary Care Shenay Torti: [name] Other Clinician: Referring Wilburt Messina: Treating Ryenn Howeth/Extender: [name] in Treatment: 7 Active Inactive Wound/Skin Impairment Nursing Diagnoses: Knowledge deficit related to ulceration/compromised skin integrity Goals: Patient/caregiver will  verbalize understanding of skin care regimen Date Initiated: 05/16/2023 Target Resolution Date: 07/16/2023 Goal Status: Active Ulcer/skin breakdown will have [name] volume reduction of 30% by week 4 Date Initiated: 05/16/2023 Date Inactivated: 06/20/2023 Target Resolution Date: 06/16/2023 Goal Status: Met Ulcer/skin breakdown will have [name] volume reduction of 50% by week 8 Date Initiated: 05/16/2023 Target Resolution Date: 07/16/2023 Goal Status: Active Ulcer/skin breakdown will have [name] volume reduction of 80% by week 12 Date Initiated: 05/16/2023 Target Resolution Date: 08/16/2023 [name], [name] ([id number]) [id number] Page 6 of 9 Goal Status: Active Ulcer/skin breakdown will heal within 14 weeks Date Initiated: 05/16/2023 Target Resolution Date: 09/15/2023 Goal Status: Active Interventions: Assess patient/caregiver ability to obtain necessary supplies Assess patient/caregiver ability to perform ulcer/skin care regimen upon admission and as needed Assess ulceration(s) every visit Notes: Electronic Signature(s) Signed: 07/04/2023 4:49:27 PM By: Angelina Pih Entered By: Angelina Pih on 07/04/2023 09:38:17 -------------------------------------------------------------------------------- Pain Assessment Details Patient Name: Date of Service: [name], [name] 07/04/2023 12:00 PM Medical Record Number: [id number] Patient Account Number: [id number] Date of Birth/Sex: Treating RN: 04-08-1946 (77 y.o. [name] Primary Care Lash Matulich: [name] Other Clinician: Referring Avigayil Ton: Treating Zoelle Markus/Extender: [name] in Treatment: 7 Active Problems Location of Pain Severity and Description of Pain Patient Has Paino No Site Locations Rate the pain. Current Pain Level: 0 Pain Management and Medication Current Pain Management: Electronic Signature(s) Signed: 07/04/2023 4:49:27 PM By: Angelina Pih Entered By: Angelina Pih on  07/04/2023 09:07:29 [name], [name] ([id number]) [id number] Page 7 of 9 -------------------------------------------------------------------------------- Patient/Caregiver Education Details Patient Name: Date of Service: [name] 9/26/2024andnbsp12:00 PM Medical Record Number: [id number] Patient Account Number: [id number] Date of Birth/Gender: Treating RN: [birth year] (77 y.o. [name] Primary Care Physician: [name] Other Clinician: Referring Physician: Treating Physician/Extender: [name] in Treatment: 7 Education Assessment Education Provided To: Patient Education Topics Provided Wound/Skin Impairment: Handouts: Caring for Your Ulcer Methods: Explain/Verbal Responses: State content correctly Electronic Signature(s) Signed: 07/04/2023 4:49:27 PM By: Angelina Pih Entered By: Angelina Pih on 07/04/2023 09:38:26 -------------------------------------------------------------------------------- Wound Assessment Details Patient Name: Date of Service: [name] [name], [name] 07/04/2023 12:00 PM Medical Record Number: [id number] Patient Account Number: [id number] Date of Birth/Sex: Treating RN: May 18, 1946 (77 y.o. [name] Primary Care Keon Pender: [name] Other Clinician: Referring Goku Harb: Treating Zoye Chandra/Extender: [name] in Treatment: 7 Wound Status Wound Number: 21 Primary Etiology: Vasculitis Wound Location: Left, Lateral Lower Leg Wound Status: Open Wounding Event: Gradually Appeared Comorbid History: Arrhythmia, Hypertension, Gout Date Acquired: 04/08/2023 Weeks Of Treatment: 7 Clustered Wound: No Photos [name], [name] ([id number]) [id number] Page 8 of 9 Wound Measurements Length: (cm) 1.6 Width: (cm) 0.9 Depth: (cm) 0.1 Area: (cm) 1.131 Volume: (cm) 0.113 % Reduction in Area: 91.8% %  Amount: Fat Layer (Subcutaneous Tissue): Yes N/[name] N/[name] Exposed Structures: Fascia: No Tendon: No Muscle: No Joint: No Bone: No Medium (34-66%) N/[name] N/[name] Epithelialization: Treatment Notes Electronic Signature(s) Signed: 07/04/2023 4:49:27 PM By: Angelina Pih Entered By: Angelina Pih on 07/04/2023 09:27:09 -------------------------------------------------------------------------------- Multi-Disciplinary Care Plan Details Patient Name: Date of Service: [name], [name] 07/04/2023 12:00 PM Medical Record Number: [id number] Patient Account Number: [id number] Date of Birth/Sex: Treating RN: Dec 01, 1945 (77 y.o. [name] Primary Care Shenay Torti: [name] Other Clinician: Referring Wilburt Messina: Treating Ryenn Howeth/Extender: [name] in Treatment: 7 Active Inactive Wound/Skin Impairment Nursing Diagnoses: Knowledge deficit related to ulceration/compromised skin integrity Goals: Patient/caregiver will  verbalize understanding of skin care regimen Date Initiated: 05/16/2023 Target Resolution Date: 07/16/2023 Goal Status: Active Ulcer/skin breakdown will have [name] volume reduction of 30% by week 4 Date Initiated: 05/16/2023 Date Inactivated: 06/20/2023 Target Resolution Date: 06/16/2023 Goal Status: Met Ulcer/skin breakdown will have [name] volume reduction of 50% by week 8 Date Initiated: 05/16/2023 Target Resolution Date: 07/16/2023 Goal Status: Active Ulcer/skin breakdown will have [name] volume reduction of 80% by week 12 Date Initiated: 05/16/2023 Target Resolution Date: 08/16/2023 [name], [name] ([id number]) [id number] Page 6 of 9 Goal Status: Active Ulcer/skin breakdown will heal within 14 weeks Date Initiated: 05/16/2023 Target Resolution Date: 09/15/2023 Goal Status: Active Interventions: Assess patient/caregiver ability to obtain necessary supplies Assess patient/caregiver ability to perform ulcer/skin care regimen upon admission and as needed Assess ulceration(s) every visit Notes: Electronic Signature(s) Signed: 07/04/2023 4:49:27 PM By: Angelina Pih Entered By: Angelina Pih on 07/04/2023 09:38:17 -------------------------------------------------------------------------------- Pain Assessment Details Patient Name: Date of Service: [name], [name] 07/04/2023 12:00 PM Medical Record Number: [id number] Patient Account Number: [id number] Date of Birth/Sex: Treating RN: 04-08-1946 (77 y.o. [name] Primary Care Lash Matulich: [name] Other Clinician: Referring Avigayil Ton: Treating Zoelle Markus/Extender: [name] in Treatment: 7 Active Problems Location of Pain Severity and Description of Pain Patient Has Paino No Site Locations Rate the pain. Current Pain Level: 0 Pain Management and Medication Current Pain Management: Electronic Signature(s) Signed: 07/04/2023 4:49:27 PM By: Angelina Pih Entered By: Angelina Pih on  07/04/2023 09:07:29 [name], [name] ([id number]) [id number] Page 7 of 9 -------------------------------------------------------------------------------- Patient/Caregiver Education Details Patient Name: Date of Service: [name] 9/26/2024andnbsp12:00 PM Medical Record Number: [id number] Patient Account Number: [id number] Date of Birth/Gender: Treating RN: [birth year] (77 y.o. [name] Primary Care Physician: [name] Other Clinician: Referring Physician: Treating Physician/Extender: [name] in Treatment: 7 Education Assessment Education Provided To: Patient Education Topics Provided Wound/Skin Impairment: Handouts: Caring for Your Ulcer Methods: Explain/Verbal Responses: State content correctly Electronic Signature(s) Signed: 07/04/2023 4:49:27 PM By: Angelina Pih Entered By: Angelina Pih on 07/04/2023 09:38:26 -------------------------------------------------------------------------------- Wound Assessment Details Patient Name: Date of Service: [name] [name], [name] 07/04/2023 12:00 PM Medical Record Number: [id number] Patient Account Number: [id number] Date of Birth/Sex: Treating RN: May 18, 1946 (77 y.o. [name] Primary Care Keon Pender: [name] Other Clinician: Referring Goku Harb: Treating Zoye Chandra/Extender: [name] in Treatment: 7 Wound Status Wound Number: 21 Primary Etiology: Vasculitis Wound Location: Left, Lateral Lower Leg Wound Status: Open Wounding Event: Gradually Appeared Comorbid History: Arrhythmia, Hypertension, Gout Date Acquired: 04/08/2023 Weeks Of Treatment: 7 Clustered Wound: No Photos [name], [name] ([id number]) [id number] Page 8 of 9 Wound Measurements Length: (cm) 1.6 Width: (cm) 0.9 Depth: (cm) 0.1 Area: (cm) 1.131 Volume: (cm) 0.113 % Reduction in Area: 91.8% %  [name], [name] ([id number]) [id number] Page 1 of 9 Visit Report for 07/04/2023 Arrival Information Details Patient Name: Date of Service: [name] 07/04/2023 12:00 PM Medical Record Number: [id number] Patient Account Number: [id number] Date of Birth/Sex: Treating RN: Feb 08, 1946 (77 y.o. [name] Primary Care Elnora Quizon: [name] Other Clinician: Referring Skylen Danielsen: Treating Reed Dady/Extender: [name] in Treatment: 7 Visit Information History Since Last Visit Added or deleted any medications: No Patient Arrived: Ambulatory Any new allergies or adverse reactions: No Arrival Time: 12:04 Had [name] fall or experienced change in No Accompanied By: self activities of daily living that may affect Transfer Assistance: None risk of falls: Patient Identification Verified: Yes Hospitalized since last visit: No Secondary Verification Process Completed: Yes Has Dressing in Place as Prescribed: Yes Patient Requires Transmission-Based Precautions: No Has Compression in Place as Prescribed: Yes Patient Has Alerts: No Pain Present Now: No Electronic Signature(s) Signed: 07/04/2023 4:49:27 PM By: Angelina Pih Entered By: Angelina Pih on 07/04/2023 09:05:05 -------------------------------------------------------------------------------- Clinic Level of Care Assessment Details Patient Name: Date of Service: [name] 07/04/2023 12:00 PM Medical Record Number: [id number] Patient Account Number: [id number] Date of Birth/Sex: Treating RN: 06/20/1946 (77 y.o. [name] Primary Care Mustaf Antonacci: [name] Other Clinician: Referring Jaquavis Felmlee: Treating Sydell Prowell/Extender: [name] in Treatment: 7 Clinic Level of Care Assessment Items TOOL 1 Quantity Score []  - 0 Use when EandM and Procedure is performed on INITIAL visit ASSESSMENTS - Nursing Assessment / Reassessment []  -  0 General Physical Exam (combine w/ comprehensive assessment (listed just below) when performed on new pt. evals) []  - 0 Comprehensive Assessment (HX, ROS, Risk Assessments, Wounds Hx, etc.) ASSESSMENTS - Wound and Skin Assessment / Reassessment []  - 0 Dermatologic / Skin Assessment (not related to wound area) [name], [name] ([id number]) [id number] Page 2 of 9 ASSESSMENTS - Ostomy and/or Continence Assessment and Care []  - 0 Incontinence Assessment and Management []  - 0 Ostomy Care Assessment and Management (repouching, etc.) PROCESS - Coordination of Care []  - 0 Simple Patient / Family Education for ongoing care []  - 0 Complex (extensive) Patient / Family Education for ongoing care []  - 0 Staff obtains Chiropractor, Records, T Results / Process Orders est []  - 0 Staff telephones HHA, Nursing Homes / Clarify orders / etc []  - 0 Routine Transfer to another Facility (non-emergent condition) []  - 0 Routine Hospital Admission (non-emergent condition) []  - 0 New Admissions / Manufacturing engineer / Ordering NPWT Apligraf, etc. , []  - 0 Emergency Hospital Admission (emergent condition) PROCESS - Special Needs []  - 0 Pediatric / Minor Patient Management []  - 0 Isolation Patient Management []  - 0 Hearing / Language / Visual special needs []  - 0 Assessment of Community assistance (transportation, [name], etc.) []  - 0 Additional assistance / Altered mentation []  - 0 Support Surface(s) Assessment (bed, cushion, seat, etc.) INTERVENTIONS - Miscellaneous []  - 0 External ear exam []  - 0 Patient Transfer (multiple staff / Nurse, adult / Similar devices) []  - 0 Simple Staple / Suture removal (25 or less) []  - 0 Complex Staple / Suture removal (26 or more) []  - 0 Hypo/Hyperglycemic Management (do not check if billed separately) []  - 0 Ankle / Brachial Index (ABI) - do not check if billed separately Has the patient been seen at the hospital  within the last three years: Yes Total Score: 0 Level Of Care: ____ Electronic Signature(s) Signed: 07/04/2023 4:49:27 PM By: Angelina Pih

## 2023-07-05 NOTE — Progress Notes (Signed)
today patient appears to be doing well currently in regard to his wound. He is actually been tolerating the dressing changes without complication. Fortunately there does not appear to be any signs of active infection locally nor systemically which is great news and  overall I am extremely [name], [name] ([id number]) [id number] Page 4 of 11 pleased with where we stand currently. 03-12-2023 upon evaluation today patient's wound actually showed signs of excellent improvement. I am very pleased with where we stand I do believe that we are making good progress here. I do not see any signs of active infection. 03-19-2023 upon evaluation today patient actually appears to be making excellent progress in regard to his leg and getting this closed. In fact is just a very small area that is actually open at this point. I am actually very pleased with what we are seeing today. 03-26-2023 upon evaluation today patient appears to be doing well currently in regard to his leg which is actually showing signs of being completely healed. Fortunately I do not see any signs of active infection locally or systemically which is great news and in general I do believe that we are moving in the right direction here. Readmission: 05-16-2023 upon evaluation today patient presents for reevaluation here in the clinic concerning a wound on the left anterior/lateral lower extremity. This is similar to where the wound was last time I saw him but not exactly the same. Fortunately there does not appear to be any signs of active infection at this time which is great news. The patient's past medical history really has not changed significantly since last time I saw him. 05-24-2023 upon evaluation patient actually appears to be doing excellent in regard to his leg ulcer. He has been tolerating the dressing changes without complication in general I do feel like there were making excellent headway towards complete closure. I do not see any signs of active infection at this point. 8/29; left lateral lower leg in the setting of chronic venous insufficiency. We have been using Hydrofera Blue under Urgo K2. Wounds are making nice improvements 06-20-2023 upon evaluation today patient  appears to be doing well currently in regard to his leg ulcer. This is actually showing signs of being significantly smaller compared even last week's nurse visit this looks much better. Fortunately I do not see any need for sharp debridement he seems to be doing quite well. 06-27-2023 upon evaluation today patient appears to be doing well currently in regard to his wound. He is tolerating the dressing changes without complication. There is a little bit of hypergranulation on the use of silver nitrate today to help keep this under control. Fortunately I do not see any evidence of active infection locally or systemically which is great news. 07-04-2023 upon evaluation today patient appears to be doing well currently in regard to his wound. He has been tolerating the dressing changes without complication. Fortunately I do not see any signs of active infection locally or systemically which is great news and in general I do believe that he is making really good headway towards complete closure. This looks much better after starting the antibiotics. Electronic Signature(s) Signed: 07/04/2023 12:46:33 PM By: Allen Derry PA-C Entered By: Allen Derry on 07/04/2023 09:46:33 -------------------------------------------------------------------------------- Physical Exam Details Patient Name: Date of Service: [name], [name] 07/04/2023 12:00 PM Medical Record Number: [id number] Patient Account Number: [id number] Date of Birth/Sex: Treating RN: Mar 17, 1946 (77 y.o. [name] Primary Care Provider: [name] Other Clinician:  today patient appears to be doing well currently in regard to his wound. He is actually been tolerating the dressing changes without complication. Fortunately there does not appear to be any signs of active infection locally nor systemically which is great news and  overall I am extremely [name], [name] ([id number]) [id number] Page 4 of 11 pleased with where we stand currently. 03-12-2023 upon evaluation today patient's wound actually showed signs of excellent improvement. I am very pleased with where we stand I do believe that we are making good progress here. I do not see any signs of active infection. 03-19-2023 upon evaluation today patient actually appears to be making excellent progress in regard to his leg and getting this closed. In fact is just a very small area that is actually open at this point. I am actually very pleased with what we are seeing today. 03-26-2023 upon evaluation today patient appears to be doing well currently in regard to his leg which is actually showing signs of being completely healed. Fortunately I do not see any signs of active infection locally or systemically which is great news and in general I do believe that we are moving in the right direction here. Readmission: 05-16-2023 upon evaluation today patient presents for reevaluation here in the clinic concerning a wound on the left anterior/lateral lower extremity. This is similar to where the wound was last time I saw him but not exactly the same. Fortunately there does not appear to be any signs of active infection at this time which is great news. The patient's past medical history really has not changed significantly since last time I saw him. 05-24-2023 upon evaluation patient actually appears to be doing excellent in regard to his leg ulcer. He has been tolerating the dressing changes without complication in general I do feel like there were making excellent headway towards complete closure. I do not see any signs of active infection at this point. 8/29; left lateral lower leg in the setting of chronic venous insufficiency. We have been using Hydrofera Blue under Urgo K2. Wounds are making nice improvements 06-20-2023 upon evaluation today patient  appears to be doing well currently in regard to his leg ulcer. This is actually showing signs of being significantly smaller compared even last week's nurse visit this looks much better. Fortunately I do not see any need for sharp debridement he seems to be doing quite well. 06-27-2023 upon evaluation today patient appears to be doing well currently in regard to his wound. He is tolerating the dressing changes without complication. There is a little bit of hypergranulation on the use of silver nitrate today to help keep this under control. Fortunately I do not see any evidence of active infection locally or systemically which is great news. 07-04-2023 upon evaluation today patient appears to be doing well currently in regard to his wound. He has been tolerating the dressing changes without complication. Fortunately I do not see any signs of active infection locally or systemically which is great news and in general I do believe that he is making really good headway towards complete closure. This looks much better after starting the antibiotics. Electronic Signature(s) Signed: 07/04/2023 12:46:33 PM By: Allen Derry PA-C Entered By: Allen Derry on 07/04/2023 09:46:33 -------------------------------------------------------------------------------- Physical Exam Details Patient Name: Date of Service: [name], [name] 07/04/2023 12:00 PM Medical Record Number: [id number] Patient Account Number: [id number] Date of Birth/Sex: Treating RN: Mar 17, 1946 (77 y.o. [name] Primary Care Provider: [name] Other Clinician:  DAGON, BUDAI [name] ([id number]) [id number] Page 1 of 11 Visit Report for 07/04/2023 Chief Complaint Document Details Patient Name: Date of Service: [name] 07/04/2023 12:00 PM Medical Record Number: [id number] Patient Account Number: [id number] Date of Birth/Sex: Treating RN: Nov 26, 1945 (77 y.o. [name] Primary Care Provider: [name] Other Clinician: Referring Provider: Treating Provider/Extender: [name] in Treatment: 7 Information Obtained from: Patient Chief Complaint Left LE Ulcer Electronic Signature(s) Signed: 07/04/2023 12:46:05 PM By: Allen Derry PA-C Entered By: Allen Derry on 07/04/2023 09:46:05 -------------------------------------------------------------------------------- HPI Details Patient Name: Date of Service: [name], [name] 07/04/2023 12:00 PM Medical Record Number: [id number] Patient Account Number: [id number] Date of Birth/Sex: Treating RN: 28-Jul-1946 (77 y.o. 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 the patient has had a vascular workup over 7 years ago and said everything was normal at that stage. He does not have any chronic problems except for cardiac issues which he sees a cardiologist in [place]. 08/15/2017 -- arterial and venous duplex studies still pending. 08/23/2017 -- venous reflux studies done on 08/13/2017  shows venous incompetence throughout the left lower extremity deep system and focally at the left saphenofemoral junction. 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 No evidence of SVT or DVT in bilateral lower extremities The patient has an appointment at the end of the month to get his arterial duplex study done 09/05/2017 -- the patient was seen at the vein and vascular office yesterday by Bary Castilla. ABI studies were notable for medial calcification and the toe brachial indices were normal and bilateral ankle-brachial) waveforms were normal with triphasic flow. After review of his venous studies he was not a candidate for laser ablation and his lymphedema was to be treated with compression stockings and lymphedema pump pumps 09/12/2017 -- had a low arterial study done at the [facility] -- unable to obtain reliable ABI is due to medial calcification. Bilateral toe [name], [name] [id number] Page 2 of 11 indices were normal with the right being 1.01 and the left being 0.92 and the waveforms were triphasic bilaterally. he did get hold of 30-40 mm compression stockings but is unable to put these on. We will try and get him alternative compression stockings. 09/26/17- he is here in follow up evaluation of a right lower extremity ulcer;he is compliant in wearing compression stocking; ulcer almost epithelialized , anticipate healing next appointment Readmission: 11/17 point upon evaluation patient's wound currently that he is seeing [place] for today is a skin cancerous lesion that was cleared away by his dermatologist on the left medial calf region. He tells me that this is a very similar thing to what he had done previously in fact the last time he saw him in 2018 this was also what was going on at that point. Nonetheless he feels that based on what he seeing currently that this is just having a lot of harder time  healing although it is much closer to the surface than what he is experienced in the past. He notes  patient will contact our office for additional recommendations. Electronic Signature(s) Tyner, Codner Franklin [name] ([id number]) [id number] Page 11 of 11 Signed: 07/04/2023 12:47:26 PM By: Allen Derry PA-C Entered By: Allen Derry on 07/04/2023 09:47:25 -------------------------------------------------------------------------------- SuperBill Details Patient Name: Date of Service: [name], [name] 07/04/2023 Medical Record Number: [id number] Patient Account Number: [id number] Date of Birth/Sex: Treating RN: July 14, 1946  (77 y.o. 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 I73.89 Other specified peripheral vascular diseases I48.0 Paroxysmal atrial fibrillation I10 Essential (primary) hypertension Facility Procedures : CPT4 Code: 86578469 Description: (Facility Use Only) 9125236936 - APPLY MULTLAY COMPRS LWR LT LEG Modifier: Quantity: 1 Physician Procedures : CPT4 Code Description Modifier 1324401 99213 - WC PHYS LEVEL 3 - EST PT ICD-10 Diagnosis Description I89.0 Lymphedema, not elsewhere classified 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  DAGON, BUDAI [name] ([id number]) [id number] Page 1 of 11 Visit Report for 07/04/2023 Chief Complaint Document Details Patient Name: Date of Service: [name] 07/04/2023 12:00 PM Medical Record Number: [id number] Patient Account Number: [id number] Date of Birth/Sex: Treating RN: Nov 26, 1945 (77 y.o. [name] Primary Care Provider: [name] Other Clinician: Referring Provider: Treating Provider/Extender: [name] in Treatment: 7 Information Obtained from: Patient Chief Complaint Left LE Ulcer Electronic Signature(s) Signed: 07/04/2023 12:46:05 PM By: Allen Derry PA-C Entered By: Allen Derry on 07/04/2023 09:46:05 -------------------------------------------------------------------------------- HPI Details Patient Name: Date of Service: [name], [name] 07/04/2023 12:00 PM Medical Record Number: [id number] Patient Account Number: [id number] Date of Birth/Sex: Treating RN: 28-Jul-1946 (77 y.o. 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 the patient has had a vascular workup over 7 years ago and said everything was normal at that stage. He does not have any chronic problems except for cardiac issues which he sees a cardiologist in [place]. 08/15/2017 -- arterial and venous duplex studies still pending. 08/23/2017 -- venous reflux studies done on 08/13/2017  shows venous incompetence throughout the left lower extremity deep system and focally at the left saphenofemoral junction. 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 No evidence of SVT or DVT in bilateral lower extremities The patient has an appointment at the end of the month to get his arterial duplex study done 09/05/2017 -- the patient was seen at the vein and vascular office yesterday by Bary Castilla. ABI studies were notable for medial calcification and the toe brachial indices were normal and bilateral ankle-brachial) waveforms were normal with triphasic flow. After review of his venous studies he was not a candidate for laser ablation and his lymphedema was to be treated with compression stockings and lymphedema pump pumps 09/12/2017 -- had a low arterial study done at the [facility] -- unable to obtain reliable ABI is due to medial calcification. Bilateral toe [name], [name] [id number] Page 2 of 11 indices were normal with the right being 1.01 and the left being 0.92 and the waveforms were triphasic bilaterally. he did get hold of 30-40 mm compression stockings but is unable to put these on. We will try and get him alternative compression stockings. 09/26/17- he is here in follow up evaluation of a right lower extremity ulcer;he is compliant in wearing compression stocking; ulcer almost epithelialized , anticipate healing next appointment Readmission: 11/17 point upon evaluation patient's wound currently that he is seeing [place] for today is a skin cancerous lesion that was cleared away by his dermatologist on the left medial calf region. He tells me that this is a very similar thing to what he had done previously in fact the last time he saw him in 2018 this was also what was going on at that point. Nonetheless he feels that based on what he seeing currently that this is just having a lot of harder time  healing although it is much closer to the surface than what he is experienced in the past. He notes  ulcer of  other part of left lower leg with fat layer exposed8/05/2023 No Yes I73.89 Other specified peripheral vascular diseases 05/16/2023 No Yes I48.0 Paroxysmal atrial fibrillation 05/16/2023 No Yes I10 Essential (primary) hypertension 05/16/2023 No Yes Inactive Problems Resolved Problems Electronic Signature(s) Signed: 07/04/2023 12:46:02 PM By: Allen Derry PA-C Entered By: Allen Derry on 07/04/2023 09:46:02 [place], [place] [name] ([id number]) [id number] Page 7 of 11 -------------------------------------------------------------------------------- Progress Note Details Patient Name: Date of Service: [name] 07/04/2023 12:00 PM Medical Record Number: [id number] Patient Account Number: [id number] Date of Birth/Sex: Treating RN: [birth year] (77 y.o. 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 the patient has had a vascular workup over 7 years ago and said everything was normal at that stage. He does not have any chronic problems except for cardiac issues which he sees a cardiologist in [place]. 08/15/2017 -- arterial and venous duplex studies still pending. 08/23/2017 -- venous reflux studies done on 08/13/2017 shows venous incompetence throughout the left lower extremity deep system and focally at the left saphenofemoral junction. No venous  incompetence is noted in the right lower extremity. No evidence of SVT or DVT in bilateral lower extremities The patient has an appointment at the end of the month to get his arterial duplex study done 09/05/2017 -- the patient was seen at the vein and vascular office yesterday by Bary Castilla. ABI studies were notable for medial calcification and the toe brachial indices were normal and bilateral ankle-brachial) waveforms were normal with triphasic flow. After review of his venous studies he was not a candidate for laser ablation and his lymphedema was to be treated with compression stockings and lymphedema pump pumps 09/12/2017 -- had a low arterial study done at the [facility] -- unable to obtain reliable ABI is due to medial calcification. Bilateral toe indices were normal with the right being 1.01 and the left being 0.92 and the waveforms were triphasic bilaterally. he did get hold of 30-40 mm compression stockings but is unable to put these on. We will try and get him alternative compression stockings. 09/26/17- he is here in follow up evaluation of a right lower extremity ulcer;he is compliant in wearing compression stocking; ulcer almost epithelialized , anticipate healing next appointment Readmission: 11/17 point upon evaluation patient's wound currently that he is seeing [place] for today is a skin cancerous lesion that was cleared away by his dermatologist on the left medial calf region. He tells me that this is a very similar thing to what he had done previously in fact the last time he saw him in 2018 this was also what was going on at that point. Nonetheless he feels that based on what he seeing currently that this is just having a lot of harder time healing although it is much closer to the surface than what he is experienced in the past. He notes that the initial removal was in June 2022 which was this year this is now November and still has not closed. He does have  some edema and  patient will contact our office for additional recommendations. Electronic Signature(s) Tyner, Codner Franklin [name] ([id number]) [id number] Page 11 of 11 Signed: 07/04/2023 12:47:26 PM By: Allen Derry PA-C Entered By: Allen Derry on 07/04/2023 09:47:25 -------------------------------------------------------------------------------- SuperBill Details Patient Name: Date of Service: [name], [name] 07/04/2023 Medical Record Number: [id number] Patient Account Number: [id number] Date of Birth/Sex: Treating RN: July 14, 1946  (77 y.o. 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 I73.89 Other specified peripheral vascular diseases I48.0 Paroxysmal atrial fibrillation I10 Essential (primary) hypertension Facility Procedures : CPT4 Code: 86578469 Description: (Facility Use Only) [id number] - APPLY MULTLAY COMPRS LWR LT LEG Modifier: Quantity: 1 Physician Procedures : CPT4 Code Description Modifier [id number] 99213 - WC PHYS LEVEL 3 - EST PT ICD-10 Diagnosis Description I89.0 Lymphedema, not elsewhere classified 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  ulcer of  other part of left lower leg with fat layer exposed8/05/2023 No Yes I73.89 Other specified peripheral vascular diseases 05/16/2023 No Yes I48.0 Paroxysmal atrial fibrillation 05/16/2023 No Yes I10 Essential (primary) hypertension 05/16/2023 No Yes Inactive Problems Resolved Problems Electronic Signature(s) Signed: 07/04/2023 12:46:02 PM By: Allen Derry PA-C Entered By: Allen Derry on 07/04/2023 09:46:02 [place], [place] [name] ([id number]) [id number] Page 7 of 11 -------------------------------------------------------------------------------- Progress Note Details Patient Name: Date of Service: [name] 07/04/2023 12:00 PM Medical Record Number: [id number] Patient Account Number: [id number] Date of Birth/Sex: Treating RN: [birth year] (77 y.o. 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 the patient has had a vascular workup over 7 years ago and said everything was normal at that stage. He does not have any chronic problems except for cardiac issues which he sees a cardiologist in [place]. 08/15/2017 -- arterial and venous duplex studies still pending. 08/23/2017 -- venous reflux studies done on 08/13/2017 shows venous incompetence throughout the left lower extremity deep system and focally at the left saphenofemoral junction. No venous  incompetence is noted in the right lower extremity. No evidence of SVT or DVT in bilateral lower extremities The patient has an appointment at the end of the month to get his arterial duplex study done 09/05/2017 -- the patient was seen at the vein and vascular office yesterday by Bary Castilla. ABI studies were notable for medial calcification and the toe brachial indices were normal and bilateral ankle-brachial) waveforms were normal with triphasic flow. After review of his venous studies he was not a candidate for laser ablation and his lymphedema was to be treated with compression stockings and lymphedema pump pumps 09/12/2017 -- had a low arterial study done at the [facility] -- unable to obtain reliable ABI is due to medial calcification. Bilateral toe indices were normal with the right being 1.01 and the left being 0.92 and the waveforms were triphasic bilaterally. he did get hold of 30-40 mm compression stockings but is unable to put these on. We will try and get him alternative compression stockings. 09/26/17- he is here in follow up evaluation of a right lower extremity ulcer;he is compliant in wearing compression stocking; ulcer almost epithelialized , anticipate healing next appointment Readmission: 11/17 point upon evaluation patient's wound currently that he is seeing [place] for today is a skin cancerous lesion that was cleared away by his dermatologist on the left medial calf region. He tells me that this is a very similar thing to what he had done previously in fact the last time he saw him in 2018 this was also what was going on at that point. Nonetheless he feels that based on what he seeing currently that this is just having a lot of harder time healing although it is much closer to the surface than what he is experienced in the past. He notes that the initial removal was in June 2022 which was this year this is now November and still has not closed. He does have  some edema and  today patient appears to be doing well currently in regard to his wound. He is actually been tolerating the dressing changes without complication. Fortunately there does not appear to be any signs of active infection locally nor systemically which is great news and  overall I am extremely [name], [name] ([id number]) [id number] Page 4 of 11 pleased with where we stand currently. 03-12-2023 upon evaluation today patient's wound actually showed signs of excellent improvement. I am very pleased with where we stand I do believe that we are making good progress here. I do not see any signs of active infection. 03-19-2023 upon evaluation today patient actually appears to be making excellent progress in regard to his leg and getting this closed. In fact is just a very small area that is actually open at this point. I am actually very pleased with what we are seeing today. 03-26-2023 upon evaluation today patient appears to be doing well currently in regard to his leg which is actually showing signs of being completely healed. Fortunately I do not see any signs of active infection locally or systemically which is great news and in general I do believe that we are moving in the right direction here. Readmission: 05-16-2023 upon evaluation today patient presents for reevaluation here in the clinic concerning a wound on the left anterior/lateral lower extremity. This is similar to where the wound was last time I saw him but not exactly the same. Fortunately there does not appear to be any signs of active infection at this time which is great news. The patient's past medical history really has not changed significantly since last time I saw him. 05-24-2023 upon evaluation patient actually appears to be doing excellent in regard to his leg ulcer. He has been tolerating the dressing changes without complication in general I do feel like there were making excellent headway towards complete closure. I do not see any signs of active infection at this point. 8/29; left lateral lower leg in the setting of chronic venous insufficiency. We have been using Hydrofera Blue under Urgo K2. Wounds are making nice improvements 06-20-2023 upon evaluation today patient  appears to be doing well currently in regard to his leg ulcer. This is actually showing signs of being significantly smaller compared even last week's nurse visit this looks much better. Fortunately I do not see any need for sharp debridement he seems to be doing quite well. 06-27-2023 upon evaluation today patient appears to be doing well currently in regard to his wound. He is tolerating the dressing changes without complication. There is a little bit of hypergranulation on the use of silver nitrate today to help keep this under control. Fortunately I do not see any evidence of active infection locally or systemically which is great news. 07-04-2023 upon evaluation today patient appears to be doing well currently in regard to his wound. He has been tolerating the dressing changes without complication. Fortunately I do not see any signs of active infection locally or systemically which is great news and in general I do believe that he is making really good headway towards complete closure. This looks much better after starting the antibiotics. Electronic Signature(s) Signed: 07/04/2023 12:46:33 PM By: Allen Derry PA-C Entered By: Allen Derry on 07/04/2023 09:46:33 -------------------------------------------------------------------------------- Physical Exam Details Patient Name: Date of Service: [name], [name] 07/04/2023 12:00 PM Medical Record Number: [id number] Patient Account Number: [id number] Date of Birth/Sex: Treating RN: Mar 17, 1946 (77 y.o. [name] Primary Care Provider: [name] Other Clinician:  DAGON, BUDAI [name] ([id number]) [id number] Page 1 of 11 Visit Report for 07/04/2023 Chief Complaint Document Details Patient Name: Date of Service: [name] 07/04/2023 12:00 PM Medical Record Number: [id number] Patient Account Number: [id number] Date of Birth/Sex: Treating RN: Nov 26, 1945 (77 y.o. [name] Primary Care Provider: [name] Other Clinician: Referring Provider: Treating Provider/Extender: [name] in Treatment: 7 Information Obtained from: Patient Chief Complaint Left LE Ulcer Electronic Signature(s) Signed: 07/04/2023 12:46:05 PM By: Allen Derry PA-C Entered By: Allen Derry on 07/04/2023 09:46:05 -------------------------------------------------------------------------------- HPI Details Patient Name: Date of Service: [name], [name] 07/04/2023 12:00 PM Medical Record Number: [id number] Patient Account Number: [id number] Date of Birth/Sex: Treating RN: 28-Jul-1946 (77 y.o. 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 the patient has had a vascular workup over 7 years ago and said everything was normal at that stage. He does not have any chronic problems except for cardiac issues which he sees a cardiologist in [place]. 08/15/2017 -- arterial and venous duplex studies still pending. 08/23/2017 -- venous reflux studies done on 08/13/2017  shows venous incompetence throughout the left lower extremity deep system and focally at the left saphenofemoral junction. 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 No evidence of SVT or DVT in bilateral lower extremities The patient has an appointment at the end of the month to get his arterial duplex study done 09/05/2017 -- the patient was seen at the vein and vascular office yesterday by Bary Castilla. ABI studies were notable for medial calcification and the toe brachial indices were normal and bilateral ankle-brachial) waveforms were normal with triphasic flow. After review of his venous studies he was not a candidate for laser ablation and his lymphedema was to be treated with compression stockings and lymphedema pump pumps 09/12/2017 -- had a low arterial study done at the [facility] -- unable to obtain reliable ABI is due to medial calcification. Bilateral toe [name], [name] [id number] Page 2 of 11 indices were normal with the right being 1.01 and the left being 0.92 and the waveforms were triphasic bilaterally. he did get hold of 30-40 mm compression stockings but is unable to put these on. We will try and get him alternative compression stockings. 09/26/17- he is here in follow up evaluation of a right lower extremity ulcer;he is compliant in wearing compression stocking; ulcer almost epithelialized , anticipate healing next appointment Readmission: 11/17 point upon evaluation patient's wound currently that he is seeing [place] for today is a skin cancerous lesion that was cleared away by his dermatologist on the left medial calf region. He tells me that this is a very similar thing to what he had done previously in fact the last time he saw him in 2018 this was also what was going on at that point. Nonetheless he feels that based on what he seeing currently that this is just having a lot of harder time  healing although it is much closer to the surface than what he is experienced in the past. He notes  Referring Provider: Treating Provider/Extender: [name] in Treatment: 7 Constitutional Well-nourished and well-hydrated in no acute distress. Respiratory normal breathing without difficulty. Psychiatric this patient is able to make decisions and demonstrates good insight into disease process. Alert and Oriented x 3. pleasant and  cooperative. Notes Upon inspection patient's wound bed actually showed signs of good granulation epithelization at this point. Fortunately I do not see any evidence of worsening overall and I believe that the patient is making good headway towards closure. I am in a continue with the [facility] will use all motion underneath. Electronic Signature(s) Signed: 07/04/2023 12:46:49 PM By: Allen Derry PA-C Entered By: Allen Derry on 07/04/2023 09:46:49 [name] ([id number]) [id number] Page 5 of 11 -------------------------------------------------------------------------------- Physician Orders Details Patient Name: Date of Service: [name] 07/04/2023 12:00 PM Medical Record Number: [id number] Patient Account Number: [id number] Date of Birth/Sex: Treating RN: [birth year] (77 y.o. [name] Primary Care Provider: [name] Other Clinician: Referring Provider: Treating Provider/Extender: [name] in Treatment: 7 Verbal / Phone Orders: No Diagnosis Coding Follow-up Appointments Return Appointment in 1 week. Bathing/ Shower/ Hygiene May shower; gently cleanse wound with antibacterial soap, rinse and pat dry prior to dressing wounds Edema Control - Lymphedema / Segmental Compressive Device / Other Patient to wear own compression stockings. Remove compression stockings every night before going to bed and put on every morning when getting up. - right Elevate, Exercise Daily and A void Standing for Long Periods of Time. Elevate legs to the level of the heart and pump ankles as often as possible Elevate leg(s) parallel to the floor when sitting. Wound Treatment Wound #21 - Lower Leg Wound Laterality: Left, Lateral Cleanser: Soap and Water 1 x Per Week/30 Days Discharge Instructions: Gently cleanse wound with antibacterial soap, rinse and pat dry prior to dressing wounds Cleanser: Wound Cleanser 1 x Per Week/30  Days Discharge Instructions: Wash your hands with soap and water. Remove old dressing, discard into plastic bag and place into trash. Cleanse the wound with Wound Cleanser prior to applying a clean dressing using gauze sponges, not tissues or cotton balls. Do not scrub or use excessive force. Pat dry using gauze sponges, not tissue or cotton balls. Peri-Wound Care: AandD Ointment 1 x Per Week/30 Days Discharge Instructions: Apply AandD Ointment as directed Prim Dressing: Hydrofera Blue Ready Transfer Foam, 2.5x2.5 (in/in) 1 x Per Week/30 Days ary Discharge Instructions: Apply Hydrofera Blue Ready to wound bed as directed Prim Dressing: Curad Oil Emulsion Dressing 3x3 (in/in) ary 1 x Per Week/30 Days Discharge Instructions: prior to HB Secondary Dressing: Zetuvit Plus 4x8 (in/in) 1 x Per Week/30 Days Compression Wrap: Urgo K2, two layer compression system, regular 1 x Per Week/30 Days Electronic Signature(s) Signed: 07/04/2023 4:49:27 PM By: Angelina Pih Signed: 07/05/2023 2:21:22 PM By: Allen Derry PA-C Entered By: Angelina Pih on 07/04/2023 09:36:55 [name] ([id number]) [id number] Page 6 of 11 -------------------------------------------------------------------------------- Problem List Details Patient Name: Date of Service: [name] 07/04/2023 12:00 PM Medical Record Number: [id number] Patient Account Number: [id number] Date of Birth/Sex: Treating RN: July 06, 1946 (77 y.o. 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 Respiratory normal breathing without difficulty. Psychiatric this patient is able to make decisions and demonstrates good insight into disease process. Alert and Oriented x 3. pleasant and  cooperative. Notes Upon inspection patient's wound bed actually showed signs of good granulation epithelization at this point. Fortunately I do not see any evidence of worsening overall and I believe that the patient is making good headway towards closure. I am in a continue with the [facility] will use all motion underneath. Electronic Signature(s) Signed: 07/04/2023 12:46:49 PM By: Allen Derry PA-C Entered By: Allen Derry on 07/04/2023 09:46:49 [name] ([id number]) [id number] Page 5 of 11 -------------------------------------------------------------------------------- Physician Orders Details Patient Name: Date of Service: [name] 07/04/2023 12:00 PM Medical Record Number: [id number] Patient Account Number: [id number] Date of Birth/Sex: Treating RN: [birth year] (77 y.o. [name] Primary Care Provider: [name] Other Clinician: Referring Provider: Treating Provider/Extender: [name] in Treatment: 7 Verbal / Phone Orders: No Diagnosis Coding Follow-up Appointments Return Appointment in 1 week. Bathing/ Shower/ Hygiene May shower; gently cleanse wound with antibacterial soap, rinse and pat dry prior to dressing wounds Edema Control - Lymphedema / Segmental Compressive Device / Other Patient to wear own compression stockings. Remove compression stockings every night before going to bed and put on every morning when getting up. - right Elevate, Exercise Daily and A void Standing for Long Periods of Time. Elevate legs to the level of the heart and pump ankles as often as possible Elevate leg(s) parallel to the floor when sitting. Wound Treatment Wound #21 - Lower Leg Wound Laterality: Left, Lateral Cleanser: Soap and Water 1 x Per Week/30 Days Discharge Instructions: Gently cleanse wound with antibacterial soap, rinse and pat dry prior to dressing wounds Cleanser: Wound Cleanser 1 x Per Week/30  Days Discharge Instructions: Wash your hands with soap and water. Remove old dressing, discard into plastic bag and place into trash. Cleanse the wound with Wound Cleanser prior to applying a clean dressing using gauze sponges, not tissues or cotton balls. Do not scrub or use excessive force. Pat dry using gauze sponges, not tissue or cotton balls. Peri-Wound Care: AandD Ointment 1 x Per Week/30 Days Discharge Instructions: Apply AandD Ointment as directed Prim Dressing: Hydrofera Blue Ready Transfer Foam, 2.5x2.5 (in/in) 1 x Per Week/30 Days ary Discharge Instructions: Apply Hydrofera Blue Ready to wound bed as directed Prim Dressing: Curad Oil Emulsion Dressing 3x3 (in/in) ary 1 x Per Week/30 Days Discharge Instructions: prior to HB Secondary Dressing: Zetuvit Plus 4x8 (in/in) 1 x Per Week/30 Days Compression Wrap: Urgo K2, two layer compression system, regular 1 x Per Week/30 Days Electronic Signature(s) Signed: 07/04/2023 4:49:27 PM By: Angelina Pih Signed: 07/05/2023 2:21:22 PM By: Allen Derry PA-C Entered By: Angelina Pih on 07/04/2023 09:36:55 [name] ([id number]) [id number] Page 6 of 11 -------------------------------------------------------------------------------- Problem List Details Patient Name: Date of Service: [name] 07/04/2023 12:00 PM Medical Record Number: [id number] Patient Account Number: [id number] Date of Birth/Sex: Treating RN: July 06, 1946 (77 y.o. [name] Primary Care Provider: [name] Other Clinician: Referring Provider: Treating Provider/Extender: [name] in Treatment: 7 Active Problems ICD-10 Encounter Code Description Active Date MDM Diagnosis I89.0 Lymphedema, not elsewhere classified 05/16/2023 No Yes I87.333 Chronic venous hypertension (idiopathic) with ulcer and inflammation of 05/16/2023 No Yes bilateral lower extremity L97.822 Non-pressure chronic  DAGON, BUDAI [name] ([id number]) [id number] Page 1 of 11 Visit Report for 07/04/2023 Chief Complaint Document Details Patient Name: Date of Service: [name] 07/04/2023 12:00 PM Medical Record Number: [id number] Patient Account Number: [id number] Date of Birth/Sex: Treating RN: Nov 26, 1945 (77 y.o. [name] Primary Care Provider: [name] Other Clinician: Referring Provider: Treating Provider/Extender: [name] in Treatment: 7 Information Obtained from: Patient Chief Complaint Left LE Ulcer Electronic Signature(s) Signed: 07/04/2023 12:46:05 PM By: Allen Derry PA-C Entered By: Allen Derry on 07/04/2023 09:46:05 -------------------------------------------------------------------------------- HPI Details Patient Name: Date of Service: [name], [name] 07/04/2023 12:00 PM Medical Record Number: [id number] Patient Account Number: [id number] Date of Birth/Sex: Treating RN: 28-Jul-1946 (77 y.o. 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 the patient has had a vascular workup over 7 years ago and said everything was normal at that stage. He does not have any chronic problems except for cardiac issues which he sees a cardiologist in [place]. 08/15/2017 -- arterial and venous duplex studies still pending. 08/23/2017 -- venous reflux studies done on 08/13/2017  shows venous incompetence throughout the left lower extremity deep system and focally at the left saphenofemoral junction. 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 No evidence of SVT or DVT in bilateral lower extremities The patient has an appointment at the end of the month to get his arterial duplex study done 09/05/2017 -- the patient was seen at the vein and vascular office yesterday by Bary Castilla. ABI studies were notable for medial calcification and the toe brachial indices were normal and bilateral ankle-brachial) waveforms were normal with triphasic flow. After review of his venous studies he was not a candidate for laser ablation and his lymphedema was to be treated with compression stockings and lymphedema pump pumps 09/12/2017 -- had a low arterial study done at the [facility] -- unable to obtain reliable ABI is due to medial calcification. Bilateral toe [name], [name] [id number] Page 2 of 11 indices were normal with the right being 1.01 and the left being 0.92 and the waveforms were triphasic bilaterally. he did get hold of 30-40 mm compression stockings but is unable to put these on. We will try and get him alternative compression stockings. 09/26/17- he is here in follow up evaluation of a right lower extremity ulcer;he is compliant in wearing compression stocking; ulcer almost epithelialized , anticipate healing next appointment Readmission: 11/17 point upon evaluation patient's wound currently that he is seeing [place] for today is a skin cancerous lesion that was cleared away by his dermatologist on the left medial calf region. He tells me that this is a very similar thing to what he had done previously in fact the last time he saw him in 2018 this was also what was going on at that point. Nonetheless he feels that based on what he seeing currently that this is just having a lot of harder time  healing although it is much closer to the surface than what he is experienced in the past. He notes  Referring Provider: Treating Provider/Extender: [name] in Treatment: 7 Constitutional Well-nourished and well-hydrated in no acute distress. Respiratory normal breathing without difficulty. Psychiatric this patient is able to make decisions and demonstrates good insight into disease process. Alert and Oriented x 3. pleasant and  cooperative. Notes Upon inspection patient's wound bed actually showed signs of good granulation epithelization at this point. Fortunately I do not see any evidence of worsening overall and I believe that the patient is making good headway towards closure. I am in a continue with the [facility] will use all motion underneath. Electronic Signature(s) Signed: 07/04/2023 12:46:49 PM By: Allen Derry PA-C Entered By: Allen Derry on 07/04/2023 09:46:49 [name] ([id number]) [id number] Page 5 of 11 -------------------------------------------------------------------------------- Physician Orders Details Patient Name: Date of Service: [name] 07/04/2023 12:00 PM Medical Record Number: [id number] Patient Account Number: [id number] Date of Birth/Sex: Treating RN: [birth year] (77 y.o. [name] Primary Care Provider: [name] Other Clinician: Referring Provider: Treating Provider/Extender: [name] in Treatment: 7 Verbal / Phone Orders: No Diagnosis Coding Follow-up Appointments Return Appointment in 1 week. Bathing/ Shower/ Hygiene May shower; gently cleanse wound with antibacterial soap, rinse and pat dry prior to dressing wounds Edema Control - Lymphedema / Segmental Compressive Device / Other Patient to wear own compression stockings. Remove compression stockings every night before going to bed and put on every morning when getting up. - right Elevate, Exercise Daily and A void Standing for Long Periods of Time. Elevate legs to the level of the heart and pump ankles as often as possible Elevate leg(s) parallel to the floor when sitting. Wound Treatment Wound #21 - Lower Leg Wound Laterality: Left, Lateral Cleanser: Soap and Water 1 x Per Week/30 Days Discharge Instructions: Gently cleanse wound with antibacterial soap, rinse and pat dry prior to dressing wounds Cleanser: Wound Cleanser 1 x Per Week/30  Days Discharge Instructions: Wash your hands with soap and water. Remove old dressing, discard into plastic bag and place into trash. Cleanse the wound with Wound Cleanser prior to applying a clean dressing using gauze sponges, not tissues or cotton balls. Do not scrub or use excessive force. Pat dry using gauze sponges, not tissue or cotton balls. Peri-Wound Care: AandD Ointment 1 x Per Week/30 Days Discharge Instructions: Apply AandD Ointment as directed Prim Dressing: Hydrofera Blue Ready Transfer Foam, 2.5x2.5 (in/in) 1 x Per Week/30 Days ary Discharge Instructions: Apply Hydrofera Blue Ready to wound bed as directed Prim Dressing: Curad Oil Emulsion Dressing 3x3 (in/in) ary 1 x Per Week/30 Days Discharge Instructions: prior to HB Secondary Dressing: Zetuvit Plus 4x8 (in/in) 1 x Per Week/30 Days Compression Wrap: Urgo K2, two layer compression system, regular 1 x Per Week/30 Days Electronic Signature(s) Signed: 07/04/2023 4:49:27 PM By: Angelina Pih Signed: 07/05/2023 2:21:22 PM By: Allen Derry PA-C Entered By: Angelina Pih on 07/04/2023 09:36:55 [name] ([id number]) [id number] Page 6 of 11 -------------------------------------------------------------------------------- Problem List Details Patient Name: Date of Service: [name] 07/04/2023 12:00 PM Medical Record Number: [id number] Patient Account Number: [id number] Date of Birth/Sex: Treating RN: July 06, 1946 (77 y.o. 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 Respiratory normal breathing without difficulty. Psychiatric this patient is able to make decisions and demonstrates good insight into disease process. Alert and Oriented x 3. pleasant and  cooperative. Notes Upon inspection patient's wound bed actually showed signs of good granulation epithelization at this point. Fortunately I do not see any evidence of worsening overall and I believe that the patient is making good headway towards closure. I am in a continue with the [facility] will use all motion underneath. Electronic Signature(s) Signed: 07/04/2023 12:46:49 PM By: Allen Derry PA-C Entered By: Allen Derry on 07/04/2023 09:46:49 [name] ([id number]) [id number] Page 5 of 11 -------------------------------------------------------------------------------- Physician Orders Details Patient Name: Date of Service: [name] 07/04/2023 12:00 PM Medical Record Number: [id number] Patient Account Number: [id number] Date of Birth/Sex: Treating RN: [birth year] (77 y.o. [name] Primary Care Provider: [name] Other Clinician: Referring Provider: Treating Provider/Extender: [name] in Treatment: 7 Verbal / Phone Orders: No Diagnosis Coding Follow-up Appointments Return Appointment in 1 week. Bathing/ Shower/ Hygiene May shower; gently cleanse wound with antibacterial soap, rinse and pat dry prior to dressing wounds Edema Control - Lymphedema / Segmental Compressive Device / Other Patient to wear own compression stockings. Remove compression stockings every night before going to bed and put on every morning when getting up. - right Elevate, Exercise Daily and A void Standing for Long Periods of Time. Elevate legs to the level of the heart and pump ankles as often as possible Elevate leg(s) parallel to the floor when sitting. Wound Treatment Wound #21 - Lower Leg Wound Laterality: Left, Lateral Cleanser: Soap and Water 1 x Per Week/30 Days Discharge Instructions: Gently cleanse wound with antibacterial soap, rinse and pat dry prior to dressing wounds Cleanser: Wound Cleanser 1 x Per Week/30  Days Discharge Instructions: Wash your hands with soap and water. Remove old dressing, discard into plastic bag and place into trash. Cleanse the wound with Wound Cleanser prior to applying a clean dressing using gauze sponges, not tissues or cotton balls. Do not scrub or use excessive force. Pat dry using gauze sponges, not tissue or cotton balls. Peri-Wound Care: AandD Ointment 1 x Per Week/30 Days Discharge Instructions: Apply AandD Ointment as directed Prim Dressing: Hydrofera Blue Ready Transfer Foam, 2.5x2.5 (in/in) 1 x Per Week/30 Days ary Discharge Instructions: Apply Hydrofera Blue Ready to wound bed as directed Prim Dressing: Curad Oil Emulsion Dressing 3x3 (in/in) ary 1 x Per Week/30 Days Discharge Instructions: prior to HB Secondary Dressing: Zetuvit Plus 4x8 (in/in) 1 x Per Week/30 Days Compression Wrap: Urgo K2, two layer compression system, regular 1 x Per Week/30 Days Electronic Signature(s) Signed: 07/04/2023 4:49:27 PM By: Angelina Pih Signed: 07/05/2023 2:21:22 PM By: Allen Derry PA-C Entered By: Angelina Pih on 07/04/2023 09:36:55 [name] ([id number]) [id number] Page 6 of 11 -------------------------------------------------------------------------------- Problem List Details Patient Name: Date of Service: [name] 07/04/2023 12:00 PM Medical Record Number: [id number] Patient Account Number: [id number] Date of Birth/Sex: Treating RN: July 06, 1946 (77 y.o. 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 He is actually been tolerating the dressing changes without complication. Fortunately there does not appear to be any signs of active infection locally nor systemically which is great news and  overall I am extremely [name], [name] ([id number]) [id number] Page 4 of 11 pleased with where we stand currently. 03-12-2023 upon evaluation today patient's wound actually showed signs of excellent improvement. I am very pleased with where we stand I do believe that we are making good progress here. I do not see any signs of active infection. 03-19-2023 upon evaluation today patient actually appears to be making excellent progress in regard to his leg and getting this closed. In fact is just a very small area that is actually open at this point. I am actually very pleased with what we are seeing today. 03-26-2023 upon evaluation today patient appears to be doing well currently in regard to his leg which is actually showing signs of being completely healed. Fortunately I do not see any signs of active infection locally or systemically which is great news and in general I do believe that we are moving in the right direction here. Readmission: 05-16-2023 upon evaluation today patient presents for reevaluation here in the clinic concerning a wound on the left anterior/lateral lower extremity. This is similar to where the wound was last time I saw him but not exactly the same. Fortunately there does not appear to be any signs of active infection at this time which is great news. The patient's past medical history really has not changed significantly since last time I saw him. 05-24-2023 upon evaluation patient actually appears to be doing excellent in regard to his leg ulcer. He has been tolerating the dressing changes without complication in general I do feel like there were making excellent headway towards complete closure. I do not see any signs of active infection at this point. 8/29; left lateral lower leg in the setting of chronic venous insufficiency. We have been using Hydrofera Blue under Urgo K2. Wounds are making nice improvements 06-20-2023 upon evaluation today patient  appears to be doing well currently in regard to his leg ulcer. This is actually showing signs of being significantly smaller compared even last week's nurse visit this looks much better. Fortunately I do not see any need for sharp debridement he seems to be doing quite well. 06-27-2023 upon evaluation today patient appears to be doing well currently in regard to his wound. He is tolerating the dressing changes without complication. There is a little bit of hypergranulation on the use of silver nitrate today to help keep this under control. Fortunately I do not see any evidence of active infection locally or systemically which is great news. 07-04-2023 upon evaluation today patient appears to be doing well currently in regard to his wound. He has been tolerating the dressing changes without complication. Fortunately I do not see any signs of active infection locally or systemically which is great news and in general I do believe that he is making really good headway towards complete closure. This looks much better after starting the antibiotics. Electronic Signature(s) Signed: 07/04/2023 12:46:33 PM By: Allen Derry PA-C Entered By: Allen Derry on 07/04/2023 09:46:33 -------------------------------------------------------------------------------- Physical Exam Details Patient Name: Date of Service: [name], [name] 07/04/2023 12:00 PM Medical Record Number: [id number] Patient Account Number: [id number] Date of Birth/Sex: Treating RN: Mar 17, 1946 (77 y.o. [name] Primary Care Provider: [name] Other Clinician:  patient will contact our office for additional recommendations. Electronic Signature(s) Tyner, Codner Franklin [name] ([id number]) [id number] Page 11 of 11 Signed: 07/04/2023 12:47:26 PM By: Allen Derry PA-C Entered By: Allen Derry on 07/04/2023 09:47:25 -------------------------------------------------------------------------------- SuperBill Details Patient Name: Date of Service: [name], [name] 07/04/2023 Medical Record Number: [id number] Patient Account Number: [id number] Date of Birth/Sex: Treating RN: July 14, 1946  (77 y.o. 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 I73.89 Other specified peripheral vascular diseases I48.0 Paroxysmal atrial fibrillation I10 Essential (primary) hypertension Facility Procedures : CPT4 Code: 86578469 Description: (Facility Use Only) [id number] - APPLY MULTLAY COMPRS LWR LT LEG Modifier: Quantity: 1 Physician Procedures : CPT4 Code Description Modifier [id number] - WC PHYS LEVEL 3 - EST PT ICD-10 Diagnosis Description I89.0 Lymphedema, not elsewhere classified 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

## 2023-07-09 ENCOUNTER — Encounter: Payer: Medicare PPO | Attending: Physician Assistant | Admitting: Physician Assistant

## 2023-07-09 DIAGNOSIS — I7389 Other specified peripheral vascular diseases: Secondary | ICD-10-CM | POA: Diagnosis not present

## 2023-07-09 DIAGNOSIS — C44722 Squamous cell carcinoma of skin of right lower limb, including hip: Secondary | ICD-10-CM | POA: Diagnosis not present

## 2023-07-09 DIAGNOSIS — M109 Gout, unspecified: Secondary | ICD-10-CM | POA: Diagnosis not present

## 2023-07-09 DIAGNOSIS — Z85828 Personal history of other malignant neoplasm of skin: Secondary | ICD-10-CM | POA: Diagnosis not present

## 2023-07-09 DIAGNOSIS — I480 Paroxysmal atrial fibrillation: Secondary | ICD-10-CM | POA: Diagnosis not present

## 2023-07-09 DIAGNOSIS — I87333 Chronic venous hypertension (idiopathic) with ulcer and inflammation of bilateral lower extremity: Secondary | ICD-10-CM | POA: Diagnosis not present

## 2023-07-09 DIAGNOSIS — C44629 Squamous cell carcinoma of skin of left upper limb, including shoulder: Secondary | ICD-10-CM | POA: Diagnosis not present

## 2023-07-09 DIAGNOSIS — D2262 Melanocytic nevi of left upper limb, including shoulder: Secondary | ICD-10-CM | POA: Diagnosis not present

## 2023-07-09 DIAGNOSIS — D2272 Melanocytic nevi of left lower limb, including hip: Secondary | ICD-10-CM | POA: Diagnosis not present

## 2023-07-09 DIAGNOSIS — L97822 Non-pressure chronic ulcer of other part of left lower leg with fat layer exposed: Secondary | ICD-10-CM | POA: Insufficient documentation

## 2023-07-09 DIAGNOSIS — I10 Essential (primary) hypertension: Secondary | ICD-10-CM | POA: Insufficient documentation

## 2023-07-09 DIAGNOSIS — Z7901 Long term (current) use of anticoagulants: Secondary | ICD-10-CM | POA: Diagnosis not present

## 2023-07-09 DIAGNOSIS — I890 Lymphedema, not elsewhere classified: Secondary | ICD-10-CM | POA: Insufficient documentation

## 2023-07-09 DIAGNOSIS — D225 Melanocytic nevi of trunk: Secondary | ICD-10-CM | POA: Diagnosis not present

## 2023-07-09 DIAGNOSIS — L570 Actinic keratosis: Secondary | ICD-10-CM | POA: Diagnosis not present

## 2023-07-09 DIAGNOSIS — D2271 Melanocytic nevi of right lower limb, including hip: Secondary | ICD-10-CM | POA: Diagnosis not present

## 2023-07-09 DIAGNOSIS — L821 Other seborrheic keratosis: Secondary | ICD-10-CM | POA: Diagnosis not present

## 2023-07-09 DIAGNOSIS — C44729 Squamous cell carcinoma of skin of left lower limb, including hip: Secondary | ICD-10-CM | POA: Diagnosis not present

## 2023-07-09 DIAGNOSIS — D2261 Melanocytic nevi of right upper limb, including shoulder: Secondary | ICD-10-CM | POA: Diagnosis not present

## 2023-07-09 DIAGNOSIS — L820 Inflamed seborrheic keratosis: Secondary | ICD-10-CM | POA: Diagnosis not present

## 2023-07-09 DIAGNOSIS — I87332 Chronic venous hypertension (idiopathic) with ulcer and inflammation of left lower extremity: Secondary | ICD-10-CM | POA: Diagnosis not present

## 2023-07-09 NOTE — Progress Notes (Signed)
Open Wounding Event: Gradually Appeared Comorbid History: Arrhythmia, Hypertension, Gout Date Acquired: 04/08/2023 Weeks Of Treatment: 7 Clustered Wound: No Photos [name], [name] ([id number])  [id number] Page 8 of 9 Wound Measurements Length: (cm) 0.1 Width: (cm) 0.1 Depth: (cm) 0.1 Area: (cm) 0.008 Volume: (cm) 0.001 % Reduction in Area: 99.9% % Reduction in Volume: 99.9% Epithelialization: Medium (34-66%) Wound Description Classification: Full Thickness Without Exposed Support Structures Exudate Amount: Medium Exudate Type: Serosanguineous Exudate Color: red, brown Foul Odor After Cleansing: No Slough/Fibrino Yes Wound Bed Granulation Amount: Large (67-100%) Exposed Structure Granulation Quality: Red Fascia Exposed: No Necrotic Amount: None Present (0%) Fat Layer (Subcutaneous Tissue) Exposed: Yes Tendon Exposed: No Muscle Exposed: No Joint Exposed: No Bone Exposed: No Treatment Notes Wound #21 (Lower Leg) Wound Laterality: Left, Lateral Cleanser Soap and Water Discharge Instruction: Gently cleanse wound with antibacterial soap, rinse and pat dry prior to dressing wounds Wound Cleanser Discharge Instruction: Wash your hands with soap and water. Remove old dressing, discard into plastic bag and place into trash. Cleanse the wound with Wound Cleanser prior to applying [name] clean dressing using gauze sponges, not tissues or cotton balls. Do not scrub or use excessive force. Pat dry using gauze sponges, not tissue or cotton balls. Peri-Wound Care AandD Ointment Discharge Instruction: Apply AandD Ointment as directed Topical Primary Dressing Curad Oil Emulsion Dressing 3x3 (in/in) Discharge Instruction: prior to HB Secondary Dressing Zetuvit Plus 4x8 (in/in) Secured With Compression Wrap Urgo K2, two layer compression system, regular Compression Stockings Add-Ons Electronic Signature(s) Signed: 07/09/2023 8:28:10 AM By: [name] Signed: 07/09/2023 4:57:44 PM By: [name] Aver MSN RN CNS WTA Entered By: [name] on 07/09/2023 05:22:36 [name] ([id number]) [name].pdf Page 9 of  9 -------------------------------------------------------------------------------- Vitals Details Patient Name: Date of Service: [name] [name] 07/09/2023 8:15 [name] Medical Record Number: [id number] Patient Account Number: [id number] Date of Birth/Sex: Treating RN: 25-Dec-1945 (77 y.o. [name] Primary Care [name]: [name] Other Clinician: Betha [name] Referring [name]: Treating [name]/Extender: [name] in Treatment: 7 Vital Signs Time Taken: 08:12 Temperature (F): 98.0 Height (in): 75 Pulse (bpm): 59 Weight (lbs): 220 Respiratory Rate (breaths/min): 18 Body Mass Index (BMI): 27.5 Blood Pressure (mmHg): 145/78 Reference Range: 80 - 120 mg / dl Electronic Signature(s) Signed: 07/09/2023 8:28:10 AM By: [name] Entered By: [name] on 07/09/2023 05:15:26  Type: red, brown [name] [name] Exudate Color: Large (67-100%) [name] [name] Granulation Amount: Red [name] [name] Granulation Quality: None Present (0%) [name] [name] Necrotic Amount: Fat Layer (Subcutaneous Tissue): Yes [name] [name] Exposed Structures: Fascia: No Tendon: No Muscle: No Joint: No Bone: No Medium (34-66%) [name] [name] Epithelialization: Treatment Notes Electronic Signature(s) Signed: 07/09/2023 8:28:10 AM By: [name] Entered By: [name] on 07/09/2023 05:23:43 -------------------------------------------------------------------------------- Multi-Disciplinary Care Plan Details Patient Name: Date of Service: [name] [name], [name] 07/09/2023 8:15 [name] Medical Record Number: [id number] Patient Account Number: [id number] Date of Birth/Sex: Treating RN: April 24, 1946 (77 y.o. [name] Primary Care [name]: [name] Other Clinician: Betha [name] Referring [name]: Treating [name]/Extender: [name] in Treatment: 7 Active Inactive Wound/Skin Impairment Nursing Diagnoses: Knowledge deficit related to  ulceration/compromised skin integrity Goals: Patient/caregiver will verbalize understanding of skin care regimen Date Initiated: 05/16/2023 Target Resolution Date: 07/16/2023 Goal Status: Active Ulcer/skin breakdown will have [name] volume reduction of 30% by week 4 Date Initiated: 05/16/2023 Date Inactivated: 06/20/2023 Target Resolution Date: 06/16/2023 Goal Status: Met Ulcer/skin breakdown will have [name] volume reduction of 50% by week 8 Date Initiated: 05/16/2023 Target Resolution Date: 07/16/2023 Goal Status: Active Ulcer/skin breakdown will have [name] volume reduction of 80% by week 12 Date Initiated: 05/16/2023 Target Resolution Date: 08/16/2023 Goal Status: Active Ulcer/skin breakdown will heal within 14 weeks Date Initiated: 05/16/2023 Target Resolution Date: 09/15/2023 Goal Status: Active [name], [name] ([id number]) [id number] Page 6 of 9 Interventions: Assess patient/caregiver ability to obtain necessary supplies Assess patient/caregiver ability to perform ulcer/skin care regimen upon admission and as needed Assess ulceration(s) every visit Notes: Electronic Signature(s) Signed: 07/09/2023 4:50:39 PM By: [name] Signed: 07/09/2023 4:57:44 PM By: [name] Aver MSN RN CNS WTA Entered By: [name] on 07/09/2023 05:45:15 -------------------------------------------------------------------------------- Pain Assessment Details Patient Name: Date of Service: [name] [name], [name] 07/09/2023 8:15 [name] Medical Record Number: [id number] Patient Account Number: [id number] Date of Birth/Sex: Treating RN: 15-Oct-1945 (77 y.o. [name] Primary Care [name]: [name] Other Clinician: Betha [name] Referring [name]: Treating [name]/Extender: [name] in Treatment: 7 Active Problems Location of Pain Severity and Description of Pain Patient Has Paino No Site Locations Pain Management and Medication Current Pain  Management: Electronic Signature(s) Signed: 07/09/2023 8:28:10 AM By: [name] Signed: 07/09/2023 4:57:44 PM By: [name] Aver MSN RN CNS WTA Entered By: [name] on 07/09/2023 05:15:31 [name] ([id number]) [id number] Page 7 of 9 -------------------------------------------------------------------------------- Patient/Caregiver Education Details Patient Name: Date of Service: [name] [name] 10/1/2024andnbsp8:15 [name] Medical Record Number: [id number] Patient Account Number: [id number] Date of Birth/Gender: Treating RN: 20-Feb-1946 (77 y.o. [name] Primary Care Physician: [name] Other Clinician: Betha [name] Referring Physician: Treating Physician/Extender: [name] in Treatment: 7 Education Assessment Education Provided To: Patient Education Topics Provided Wound/Skin Impairment: Handouts: Other: continue wound care as directed Methods: Explain/Verbal Responses: State content correctly Electronic Signature(s) Signed: 07/09/2023 4:50:39 PM By: [name] Entered By: [name] on 07/09/2023 05:45:36 -------------------------------------------------------------------------------- Wound Assessment Details Patient Name: Date of Service: [name] [name], [name] 07/09/2023 8:15 [name] Medical Record Number: [id number] Patient Account Number: [id number] Date of Birth/Sex: Treating RN: 12/21/1945 (77 y.o. [name] Primary Care Zafir Schauer: [name] Other Clinician: Betha [name] Referring Ardene Remley: Treating Timarie Labell/Extender: [name] in Treatment: 7 Wound Status Wound Number: 21 Primary Etiology: Vasculitis Wound Location: Left, Lateral Lower Leg Wound Status:  Index (ABI) - do not check if billed separately Has the patient been seen at the hospital within the last three years: Yes Total Score: 0 Level Of Care: ____ Electronic Signature(s) Signed: 07/09/2023 4:50:39 PM By: [name] Entered By: [name] on 07/09/2023 05:44:51 -------------------------------------------------------------------------------- Compression Therapy Details Patient Name: Date of Service: [name] [name], [name] 07/09/2023 8:15 [name] Medical Record Number: [id number] Patient Account Number: [id number] Date of Birth/Sex: Treating RN: 01-19-1946 (77 y.o. [name] Primary Care [name]: [name] Other Clinician: Cordelro, [name] ([id number]) [id number] Page 3 of 9 Referring Jenika Chiem: Treating Mayjor Ager/Extender: [name] in Treatment: 7 Compression Therapy Performed for Wound Assessment: Wound #21 Left,Lateral Lower Leg Performed By: [name], [name], Compression Type: Double Layer Post Procedure Diagnosis Same as Pre-procedure Notes patient tolerated treatment well Electronic Signature(s) Signed: 07/09/2023 4:50:39 PM By: [name] Entered By: [name] on 07/09/2023 05:44:29 -------------------------------------------------------------------------------- Encounter Discharge Information Details Patient Name: Date of Service: [name] [name], [name] 07/09/2023 8:15 [name] Medical Record Number: [id number] Patient Account Number: [id number] Date of Birth/Sex: Treating RN: 05/25/1946 (77 y.o. [name] Primary Care Greenly Rarick: [name] Other Clinician: Betha [name] Referring Cambria Osten: Treating Lynesha Bango/Extender: [name] in Treatment: 7 Encounter Discharge Information Items Discharge  Condition: Stable Ambulatory Status: Ambulatory Discharge Destination: Home Transportation: Private Auto Accompanied By: self Schedule Follow-up Appointment: Yes Clinical Summary of Care: Electronic Signature(s) Signed: 07/09/2023 4:50:39 PM By: [name] Entered By: [name] on 07/09/2023 06:59:33 -------------------------------------------------------------------------------- Lower Extremity Assessment Details Patient Name: Date of Service: [name] [name] 07/09/2023 8:15 [name] Medical Record Number: [id number] Patient Account Number: [id number] Date of Birth/Sex: Treating RN: 12-Aug-1946 (77 y.o. [name] Primary Care Cherie Lasalle: [name] Other Clinician: Betha [name] Referring Keanen Dohse: Treating Ashtan Laton/Extender: [name] in Treatment: [address] Alma [name] ([id number]) [id number] Page 4 of 9 Edema Assessment Assessed: [Left: Yes] [Right: No] Edema: [Left: Ye] [Right: s] Calf Left: Right: Point of Measurement: 37 cm From Medial Instep 34.5 cm Ankle Left: Right: Point of Measurement: 12 cm From Medial Instep 22.5 cm Vascular Assessment Pulses: Dorsalis Pedis Palpable: [Left:Yes] Electronic Signature(s) Signed: 07/09/2023 8:28:10 AM By: [name] Signed: 07/09/2023 4:57:44 PM By: [name] Aver MSN RN CNS WTA Entered By: [name] on 07/09/2023 05:23:38 -------------------------------------------------------------------------------- Multi Wound Chart Details Patient Name: Date of Service: [name] [name], [name] 07/09/2023 8:15 [name] Medical Record Number: [id number] Patient Account Number: [id number] Date of Birth/Sex: Treating RN: Apr 21, 1946 (77 y.o. [name] Primary Care Noela Brothers: [name] Other Clinician: Betha [name] Referring Keir Foland: Treating Tatsuo Musial/Extender: [name] in Treatment: 7 Vital Signs Height(in): 75 Pulse(bpm): 59 Weight(lbs):  220 Blood Pressure(mmHg): 145/78 Body Mass Index(BMI): 27.5 Temperature(F): 98.0 Respiratory Rate(breaths/min): 18 [21:Photos:] [[name]:[name]] Left, Lateral Lower Leg [name] [name] Wound Location: Gradually Appeared [name] [name] Wounding Event: Vasculitis [name] [name] Primary Etiology: Arrhythmia, Hypertension, Gout [name] [name] Comorbid History: 04/08/2023 [name] [name] Date Acquired: 7 [name] [name] Weeks of Treatment: Open [name] [name] Wound StatusROVERTO, [name] ([id number]) [id number] Page 5 of 9 No [name] [name] Wound Recurrence: 0.1x0.1x0.1 [name] [name] Measurements L x W x D (cm) 0.008 [name] [name] [name] (cm) : rea 0.001 [name] [name] Volume (cm) : 99.90% [name] [name] % Reduction in Area: 99.90% [name] [name] % Reduction in Volume: Full Thickness Without Exposed [name] [name] Classification: Support Structures Medium [name] [name] Exudate Amount: Serosanguineous [name] [name] Exudate  Open Wounding Event: Gradually Appeared Comorbid History: Arrhythmia, Hypertension, Gout Date Acquired: 04/08/2023 Weeks Of Treatment: 7 Clustered Wound: No Photos [name], [name] ([id number])  [id number] Page 8 of 9 Wound Measurements Length: (cm) 0.1 Width: (cm) 0.1 Depth: (cm) 0.1 Area: (cm) 0.008 Volume: (cm) 0.001 % Reduction in Area: 99.9% % Reduction in Volume: 99.9% Epithelialization: Medium (34-66%) Wound Description Classification: Full Thickness Without Exposed Support Structures Exudate Amount: Medium Exudate Type: Serosanguineous Exudate Color: red, brown Foul Odor After Cleansing: No Slough/Fibrino Yes Wound Bed Granulation Amount: Large (67-100%) Exposed Structure Granulation Quality: Red Fascia Exposed: No Necrotic Amount: None Present (0%) Fat Layer (Subcutaneous Tissue) Exposed: Yes Tendon Exposed: No Muscle Exposed: No Joint Exposed: No Bone Exposed: No Treatment Notes Wound #21 (Lower Leg) Wound Laterality: Left, Lateral Cleanser Soap and Water Discharge Instruction: Gently cleanse wound with antibacterial soap, rinse and pat dry prior to dressing wounds Wound Cleanser Discharge Instruction: Wash your hands with soap and water. Remove old dressing, discard into plastic bag and place into trash. Cleanse the wound with Wound Cleanser prior to applying [name] clean dressing using gauze sponges, not tissues or cotton balls. Do not scrub or use excessive force. Pat dry using gauze sponges, not tissue or cotton balls. Peri-Wound Care AandD Ointment Discharge Instruction: Apply AandD Ointment as directed Topical Primary Dressing Curad Oil Emulsion Dressing 3x3 (in/in) Discharge Instruction: prior to HB Secondary Dressing Zetuvit Plus 4x8 (in/in) Secured With Compression Wrap Urgo K2, two layer compression system, regular Compression Stockings Add-Ons Electronic Signature(s) Signed: 07/09/2023 8:28:10 AM By: [name] Signed: 07/09/2023 4:57:44 PM By: [name] Aver MSN RN CNS WTA Entered By: [name] on 07/09/2023 05:22:36 [name] ([id number]) [name].pdf Page 9 of  9 -------------------------------------------------------------------------------- Vitals Details Patient Name: Date of Service: [name] [name] 07/09/2023 8:15 [name] Medical Record Number: [id number] Patient Account Number: [id number] Date of Birth/Sex: Treating RN: 25-Dec-1945 (77 y.o. [name] Primary Care [name]: [name] Other Clinician: Betha [name] Referring [name]: Treating [name]/Extender: [name] in Treatment: 7 Vital Signs Time Taken: 08:12 Temperature (F): 98.0 Height (in): 75 Pulse (bpm): 59 Weight (lbs): 220 Respiratory Rate (breaths/min): 18 Body Mass Index (BMI): 27.5 Blood Pressure (mmHg): 145/78 Reference Range: 80 - 120 mg / dl Electronic Signature(s) Signed: 07/09/2023 8:28:10 AM By: [name] Entered By: [name] on 07/09/2023 05:15:26

## 2023-07-09 NOTE — Progress Notes (Addendum)
Wound Cleanser prior to applying a clean dressing using gauze sponges, not tissues or cotton balls. Do not scrub or use excessive force. Pat dry using gauze sponges, not tissue or cotton balls. Peri-Wound Care: AandD Ointment 1 x Per Week/30 Days Discharge Instructions: Apply AandD Ointment as directed Prim Dressing: Curad Oil Emulsion Dressing 3x3 (in/in) 1 x Per Week/30 Days ary Discharge Instructions: prior to HB Secondary Dressing: Zetuvit Plus 4x8 (in/in) 1 x Per Week/30 Days Com pression Wrap: Urgo K2, two layer compression system, regular 1 x Per [address] HARMAN, [name]  [id number] Page 11 of 11 1. I am recommend that we continue with the Adaptic followed by the Zetuvit which I think is doing a good job here. 2. I am going to recommend as well that the patient should continue with the compression wrapping for 1 more week I think at that point he should be ready for discharge at that time. 3. I am also going to recommend that the patient continue specifically with the Urgo K2 compression wrap at this point. We will see patient back for reevaluation in 1 week here in the clinic. If anything worsens or changes patient will contact our office for additional recommendations. Electronic Signature(s) Signed: 07/09/2023 10:25:12 AM By: [name] Derry PA-C Entered By: [name] on 07/09/2023 07:25:11 -------------------------------------------------------------------------------- SuperBill Details Patient Name: Date of Service: [name] 07/09/2023 Medical Record Number: [id number] Patient Account Number: [id number] Date of Birth/Sex: Treating RN: [birth year] (77 y.o. 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 I73.89 Other specified peripheral vascular diseases I48.0 Paroxysmal atrial fibrillation I10 Essential (primary) hypertension Facility Procedures : CPT4 Code: [id number] Description: (Facility Use Only) [phone] - APPLY MULTLAY COMPRS LWR LT LEG Modifier: Quantity: 1 Physician Procedures : CPT4 Code Description Modifier [id number] - WC PHYS LEVEL 3 - EST PT ICD-10 Diagnosis Description I89.0  Lymphedema, not elsewhere classified 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  L97.822 Non-pressure chronic ulcer of other part of left lower leg with fat layer exposed I73.89 Other specified peripheral vascular diseases Quantity: 1 Electronic Signature(s) Signed: 07/09/2023 10:25:31 AM By: [name] Derry PA-C Entered By: [name] on 07/09/2023 07:25:30  [name], [name] ([id number]) [id number] Page 1 of 11 Visit Report for 07/09/2023 Chief Complaint Document Details Patient Name: Date of Service: [name] [name] 07/09/2023 8:15 A M Medical Record Number: [id number] Patient Account Number: [id number] Date of Birth/Sex: Treating RN: 11/16/1945 (77 y.o. [name] Primary Care Provider: [name] Other Clinician: Betha Loa Referring Provider: Treating Provider/Extender: [name] in Treatment: 7 Information Obtained from: Patient Chief Complaint Left LE Ulcer Electronic Signature(s) Signed: 07/09/2023 8:14:42 AM By: [name] Derry PA-C Entered By: [name] on 07/09/2023 05:14:41 -------------------------------------------------------------------------------- HPI Details Patient Name: Date of Service: [name] 07/09/2023 8:15 A M Medical Record Number: [id number] Patient Account Number: [id number] Date of Birth/Sex: Treating RN: 1946-09-16 (77 y.o. 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 the patient has had a vascular workup over 7 years ago and said everything was normal at that stage. He does not have any chronic problems except for cardiac issues which he sees a cardiologist in [place]. 08/15/2017 -- arterial and venous duplex studies still pending. 08/23/2017 -- venous reflux  studies done on 08/13/2017 shows venous incompetence throughout the left lower extremity deep system and focally at the left saphenofemoral junction. 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 No evidence of SVT or DVT in bilateral lower extremities The patient has an appointment at the end of the month to get his arterial duplex study done 09/05/2017 -- the patient was seen at the vein and vascular office yesterday by Bary Castilla. ABI studies were notable for medial calcification and the toe brachial indices were normal and bilateral ankle-brachial) waveforms were normal with triphasic flow. After review of his venous studies he was not a candidate for laser ablation and his lymphedema was to be treated with compression stockings and lymphedema pump pumps 09/12/2017 -- had a low arterial study done at the [place] [facility] -- unable to obtain reliable ABI is due to medial calcification. Bilateral toe [name], [name] [id number] Page 2 of 11 indices were normal with the right being 1.01 and the left being 0.92 and the waveforms were triphasic bilaterally. he did get hold of 30-40 mm compression stockings but is unable to put these on. We will try and get him alternative compression stockings. 09/26/17- he is here in follow up evaluation of a right lower extremity ulcer;he is compliant in wearing compression stocking; ulcer almost epithelialized , anticipate healing next appointment Readmission: 11/17 point upon evaluation patient's wound currently that he is seeing [place] for today is a skin cancerous lesion that was cleared away by his dermatologist on the left medial calf region. He tells me that this is a very similar thing to what he had done previously in fact the last time he saw him in 2018 this was also what was going on at that point. Nonetheless he feels that based on what he seeing currently that this is just having  a lot of harder time healing although it is much closer to the surface than what he is  [name], [name] ([id number]) [id number] Page 1 of 11 Visit Report for 07/09/2023 Chief Complaint Document Details Patient Name: Date of Service: [name] [name] 07/09/2023 8:15 A M Medical Record Number: [id number] Patient Account Number: [id number] Date of Birth/Sex: Treating RN: 11/16/1945 (77 y.o. [name] Primary Care Provider: [name] Other Clinician: Betha Loa Referring Provider: Treating Provider/Extender: [name] in Treatment: 7 Information Obtained from: Patient Chief Complaint Left LE Ulcer Electronic Signature(s) Signed: 07/09/2023 8:14:42 AM By: [name] Derry PA-C Entered By: [name] on 07/09/2023 05:14:41 -------------------------------------------------------------------------------- HPI Details Patient Name: Date of Service: [name] 07/09/2023 8:15 A M Medical Record Number: [id number] Patient Account Number: [id number] Date of Birth/Sex: Treating RN: 1946-09-16 (77 y.o. 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 the patient has had a vascular workup over 7 years ago and said everything was normal at that stage. He does not have any chronic problems except for cardiac issues which he sees a cardiologist in [place]. 08/15/2017 -- arterial and venous duplex studies still pending. 08/23/2017 -- venous reflux  studies done on 08/13/2017 shows venous incompetence throughout the left lower extremity deep system and focally at the left saphenofemoral junction. 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 No evidence of SVT or DVT in bilateral lower extremities The patient has an appointment at the end of the month to get his arterial duplex study done 09/05/2017 -- the patient was seen at the vein and vascular office yesterday by Bary Castilla. ABI studies were notable for medial calcification and the toe brachial indices were normal and bilateral ankle-brachial) waveforms were normal with triphasic flow. After review of his venous studies he was not a candidate for laser ablation and his lymphedema was to be treated with compression stockings and lymphedema pump pumps 09/12/2017 -- had a low arterial study done at the [facility] -- unable to obtain reliable ABI is due to medial calcification. Bilateral toe [name], [name] [id number] Page 2 of 11 indices were normal with the right being 1.01 and the left being 0.92 and the waveforms were triphasic bilaterally. he did get hold of 30-40 mm compression stockings but is unable to put these on. We will try and get him alternative compression stockings. 09/26/17- he is here in follow up evaluation of a right lower extremity ulcer;he is compliant in wearing compression stocking; ulcer almost epithelialized , anticipate healing next appointment Readmission: 11/17 point upon evaluation patient's wound currently that he is seeing [place] for today is a skin cancerous lesion that was cleared away by his dermatologist on the left medial calf region. He tells me that this is a very similar thing to what he had done previously in fact the last time he saw him in 2018 this was also what was going on at that point. Nonetheless he feels that based on what he seeing currently that this is just having  a lot of harder time healing although it is much closer to the surface than what he is  is just a very tiny area that I think needs 1 week to toughen up before we cut him loose completely. Electronic Signature(s) Signed: 07/09/2023 10:24:12 AM By: [name] Derry PA-C Entered By: [name] on 07/09/2023 07:24:11 -------------------------------------------------------------------------------- Physical Exam Details Patient Name: Date of Service: [name] 07/09/2023 8:15 A M Medical Record Number: [id number] Patient Account Number: [id number] Date of Birth/Sex: Treating RN: 11-16-1945 (77 y.o. [name] Primary Care Provider: [name] Other Clinician: Betha Loa Referring Provider: Treating Provider/Extender: [name] in Treatment: 7 Constitutional Well-nourished and well-hydrated in no acute distress. Respiratory normal breathing without difficulty. Psychiatric this patient is able to make decisions and demonstrates good insight into disease process. Alert and Oriented x 3. pleasant and cooperative. Notes Upon inspection patient's wound bed actually showed signs of good granulation and epithelization in fact he is pretty much completely healed based on what we are seeing at this point I am very pleased in that regard. I do not see any signs of infection at this time which is great news as well. Electronic Signature(s) Signed: 07/09/2023 10:24:43 AM By: [name] Derry PA-C [place],Signed: 07/09/2023 10:24:43 AM By: Kathrene Alu [name] ([id number]) [id number] Page 5 of 11 Entered By: [name] on 07/09/2023 07:24:42 -------------------------------------------------------------------------------- Physician Orders Details Patient Name: Date of Service: [name] [name] 07/09/2023 8:15 A M Medical Record Number: [id number] Patient Account Number: [id number] Date of Birth/Sex: Treating RN: 03/21/1946 (77 y.o. 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 Bathing/ Shower/ Hygiene May shower; gently cleanse wound with antibacterial soap, rinse and pat dry prior to dressing wounds Edema Control - Lymphedema / Segmental Compressive Device / Other Patient to wear own compression stockings. Remove compression stockings every night before going to bed and put on every morning when getting up. - right Elevate, Exercise Daily and A void Standing for Long Periods of Time. Elevate legs to the level of the heart and pump ankles as often as possible Elevate leg(s) parallel to the floor when sitting. Wound Treatment Wound #21 - Lower Leg Wound Laterality: Left, Lateral Cleanser: Soap and Water 1 x Per Week/30 Days Discharge Instructions: Gently cleanse wound with antibacterial soap, rinse and pat dry prior to dressing wounds Cleanser: Wound Cleanser 1 x Per Week/30 Days Discharge Instructions: Wash your hands with soap and water. Remove old dressing, discard into plastic bag and place into trash. Cleanse the wound with Wound Cleanser prior to applying a clean dressing using gauze sponges, not tissues or cotton balls. Do not scrub or use excessive force. Pat dry using gauze sponges, not tissue or cotton balls. Peri-Wound Care: AandD Ointment 1 x Per Week/30 Days Discharge Instructions: Apply AandD Ointment as directed Prim Dressing: Curad Oil Emulsion Dressing 3x3 (in/in) ary 1 x Per Week/30 Days Discharge Instructions: prior to HB Secondary Dressing: Zetuvit Plus 4x8 (in/in) 1 x Per Week/30 Days Compression Wrap: Urgo K2, two layer compression system, regular  [name], [name] ([id number]) [id number] Page 1 of 11 Visit Report for 07/09/2023 Chief Complaint Document Details Patient Name: Date of Service: [name] [name] 07/09/2023 8:15 A M Medical Record Number: [id number] Patient Account Number: [id number] Date of Birth/Sex: Treating RN: 11/16/1945 (77 y.o. [name] Primary Care Provider: [name] Other Clinician: Betha Loa Referring Provider: Treating Provider/Extender: [name] in Treatment: 7 Information Obtained from: Patient Chief Complaint Left LE Ulcer Electronic Signature(s) Signed: 07/09/2023 8:14:42 AM By: [name] Derry PA-C Entered By: [name] on 07/09/2023 05:14:41 -------------------------------------------------------------------------------- HPI Details Patient Name: Date of Service: [name] 07/09/2023 8:15 A M Medical Record Number: [id number] Patient Account Number: [id number] Date of Birth/Sex: Treating RN: 1946-09-16 (77 y.o. 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 the patient has had a vascular workup over 7 years ago and said everything was normal at that stage. He does not have any chronic problems except for cardiac issues which he sees a cardiologist in [place]. 08/15/2017 -- arterial and venous duplex studies still pending. 08/23/2017 -- venous reflux  studies done on 08/13/2017 shows venous incompetence throughout the left lower extremity deep system and focally at the left saphenofemoral junction. 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 No evidence of SVT or DVT in bilateral lower extremities The patient has an appointment at the end of the month to get his arterial duplex study done 09/05/2017 -- the patient was seen at the vein and vascular office yesterday by Bary Castilla. ABI studies were notable for medial calcification and the toe brachial indices were normal and bilateral ankle-brachial) waveforms were normal with triphasic flow. After review of his venous studies he was not a candidate for laser ablation and his lymphedema was to be treated with compression stockings and lymphedema pump pumps 09/12/2017 -- had a low arterial study done at the [facility] -- unable to obtain reliable ABI is due to medial calcification. Bilateral toe [name], [name] [id number] Page 2 of 11 indices were normal with the right being 1.01 and the left being 0.92 and the waveforms were triphasic bilaterally. he did get hold of 30-40 mm compression stockings but is unable to put these on. We will try and get him alternative compression stockings. 09/26/17- he is here in follow up evaluation of a right lower extremity ulcer;he is compliant in wearing compression stocking; ulcer almost epithelialized , anticipate healing next appointment Readmission: 11/17 point upon evaluation patient's wound currently that he is seeing [place] for today is a skin cancerous lesion that was cleared away by his dermatologist on the left medial calf region. He tells me that this is a very similar thing to what he had done previously in fact the last time he saw him in 2018 this was also what was going on at that point. Nonetheless he feels that based on what he seeing currently that this is just having  a lot of harder time healing although it is much closer to the surface than what he is  is just a very tiny area that I think needs 1 week to toughen up before we cut him loose completely. Electronic Signature(s) Signed: 07/09/2023 10:24:12 AM By: [name] Derry PA-C Entered By: [name] on 07/09/2023 07:24:11 -------------------------------------------------------------------------------- Physical Exam Details Patient Name: Date of Service: [name] 07/09/2023 8:15 A M Medical Record Number: [id number] Patient Account Number: [id number] Date of Birth/Sex: Treating RN: 11-16-1945 (77 y.o. [name] Primary Care Provider: [name] Other Clinician: Betha Loa Referring Provider: Treating Provider/Extender: [name] in Treatment: 7 Constitutional Well-nourished and well-hydrated in no acute distress. Respiratory normal breathing without difficulty. Psychiatric this patient is able to make decisions and demonstrates good insight into disease process. Alert and Oriented x 3. pleasant and cooperative. Notes Upon inspection patient's wound bed actually showed signs of good granulation and epithelization in fact he is pretty much completely healed based on what we are seeing at this point I am very pleased in that regard. I do not see any signs of infection at this time which is great news as well. Electronic Signature(s) Signed: 07/09/2023 10:24:43 AM By: [name] Derry PA-C [place],Signed: 07/09/2023 10:24:43 AM By: Kathrene Alu [name] ([id number]) [id number] Page 5 of 11 Entered By: [name] on 07/09/2023 07:24:42 -------------------------------------------------------------------------------- Physician Orders Details Patient Name: Date of Service: [name] [name] 07/09/2023 8:15 A M Medical Record Number: [id number] Patient Account Number: [id number] Date of Birth/Sex: Treating RN: 03/21/1946 (77 y.o. 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 Bathing/ Shower/ Hygiene May shower; gently cleanse wound with antibacterial soap, rinse and pat dry prior to dressing wounds Edema Control - Lymphedema / Segmental Compressive Device / Other Patient to wear own compression stockings. Remove compression stockings every night before going to bed and put on every morning when getting up. - right Elevate, Exercise Daily and A void Standing for Long Periods of Time. Elevate legs to the level of the heart and pump ankles as often as possible Elevate leg(s) parallel to the floor when sitting. Wound Treatment Wound #21 - Lower Leg Wound Laterality: Left, Lateral Cleanser: Soap and Water 1 x Per Week/30 Days Discharge Instructions: Gently cleanse wound with antibacterial soap, rinse and pat dry prior to dressing wounds Cleanser: Wound Cleanser 1 x Per Week/30 Days Discharge Instructions: Wash your hands with soap and water. Remove old dressing, discard into plastic bag and place into trash. Cleanse the wound with Wound Cleanser prior to applying a clean dressing using gauze sponges, not tissues or cotton balls. Do not scrub or use excessive force. Pat dry using gauze sponges, not tissue or cotton balls. Peri-Wound Care: AandD Ointment 1 x Per Week/30 Days Discharge Instructions: Apply AandD Ointment as directed Prim Dressing: Curad Oil Emulsion Dressing 3x3 (in/in) ary 1 x Per Week/30 Days Discharge Instructions: prior to HB Secondary Dressing: Zetuvit Plus 4x8 (in/in) 1 x Per Week/30 Days Compression Wrap: Urgo K2, two layer compression system, regular  is just a very tiny area that I think needs 1 week to toughen up before we cut him loose completely. Electronic Signature(s) Signed: 07/09/2023 10:24:12 AM By: [name] Derry PA-C Entered By: [name] on 07/09/2023 07:24:11 -------------------------------------------------------------------------------- Physical Exam Details Patient Name: Date of Service: [name] 07/09/2023 8:15 A M Medical Record Number: [id number] Patient Account Number: [id number] Date of Birth/Sex: Treating RN: 11-16-1945 (77 y.o. [name] Primary Care Provider: [name] Other Clinician: Betha Loa Referring Provider: Treating Provider/Extender: [name] in Treatment: 7 Constitutional Well-nourished and well-hydrated in no acute distress. Respiratory normal breathing without difficulty. Psychiatric this patient is able to make decisions and demonstrates good insight into disease process. Alert and Oriented x 3. pleasant and cooperative. Notes Upon inspection patient's wound bed actually showed signs of good granulation and epithelization in fact he is pretty much completely healed based on what we are seeing at this point I am very pleased in that regard. I do not see any signs of infection at this time which is great news as well. Electronic Signature(s) Signed: 07/09/2023 10:24:43 AM By: [name] Derry PA-C [place],Signed: 07/09/2023 10:24:43 AM By: Kathrene Alu [name] ([id number]) [id number] Page 5 of 11 Entered By: [name] on 07/09/2023 07:24:42 -------------------------------------------------------------------------------- Physician Orders Details Patient Name: Date of Service: [name] [name] 07/09/2023 8:15 A M Medical Record Number: [id number] Patient Account Number: [id number] Date of Birth/Sex: Treating RN: 03/21/1946 (77 y.o. 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 Bathing/ Shower/ Hygiene May shower; gently cleanse wound with antibacterial soap, rinse and pat dry prior to dressing wounds Edema Control - Lymphedema / Segmental Compressive Device / Other Patient to wear own compression stockings. Remove compression stockings every night before going to bed and put on every morning when getting up. - right Elevate, Exercise Daily and A void Standing for Long Periods of Time. Elevate legs to the level of the heart and pump ankles as often as possible Elevate leg(s) parallel to the floor when sitting. Wound Treatment Wound #21 - Lower Leg Wound Laterality: Left, Lateral Cleanser: Soap and Water 1 x Per Week/30 Days Discharge Instructions: Gently cleanse wound with antibacterial soap, rinse and pat dry prior to dressing wounds Cleanser: Wound Cleanser 1 x Per Week/30 Days Discharge Instructions: Wash your hands with soap and water. Remove old dressing, discard into plastic bag and place into trash. Cleanse the wound with Wound Cleanser prior to applying a clean dressing using gauze sponges, not tissues or cotton balls. Do not scrub or use excessive force. Pat dry using gauze sponges, not tissue or cotton balls. Peri-Wound Care: AandD Ointment 1 x Per Week/30 Days Discharge Instructions: Apply AandD Ointment as directed Prim Dressing: Curad Oil Emulsion Dressing 3x3 (in/in) ary 1 x Per Week/30 Days Discharge Instructions: prior to HB Secondary Dressing: Zetuvit Plus 4x8 (in/in) 1 x Per Week/30 Days Compression Wrap: Urgo K2, two layer compression system, regular  is just a very tiny area that I think needs 1 week to toughen up before we cut him loose completely. Electronic Signature(s) Signed: 07/09/2023 10:24:12 AM By: [name] Derry PA-C Entered By: [name] on 07/09/2023 07:24:11 -------------------------------------------------------------------------------- Physical Exam Details Patient Name: Date of Service: [name] 07/09/2023 8:15 A M Medical Record Number: [id number] Patient Account Number: [id number] Date of Birth/Sex: Treating RN: 11-16-1945 (77 y.o. [name] Primary Care Provider: [name] Other Clinician: Betha Loa Referring Provider: Treating Provider/Extender: [name] in Treatment: 7 Constitutional Well-nourished and well-hydrated in no acute distress. Respiratory normal breathing without difficulty. Psychiatric this patient is able to make decisions and demonstrates good insight into disease process. Alert and Oriented x 3. pleasant and cooperative. Notes Upon inspection patient's wound bed actually showed signs of good granulation and epithelization in fact he is pretty much completely healed based on what we are seeing at this point I am very pleased in that regard. I do not see any signs of infection at this time which is great news as well. Electronic Signature(s) Signed: 07/09/2023 10:24:43 AM By: [name] Derry PA-C [place],Signed: 07/09/2023 10:24:43 AM By: Kathrene Alu [name] ([id number]) [id number] Page 5 of 11 Entered By: [name] on 07/09/2023 07:24:42 -------------------------------------------------------------------------------- Physician Orders Details Patient Name: Date of Service: [name] [name] 07/09/2023 8:15 A M Medical Record Number: [id number] Patient Account Number: [id number] Date of Birth/Sex: Treating RN: 03/21/1946 (77 y.o. 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 Bathing/ Shower/ Hygiene May shower; gently cleanse wound with antibacterial soap, rinse and pat dry prior to dressing wounds Edema Control - Lymphedema / Segmental Compressive Device / Other Patient to wear own compression stockings. Remove compression stockings every night before going to bed and put on every morning when getting up. - right Elevate, Exercise Daily and A void Standing for Long Periods of Time. Elevate legs to the level of the heart and pump ankles as often as possible Elevate leg(s) parallel to the floor when sitting. Wound Treatment Wound #21 - Lower Leg Wound Laterality: Left, Lateral Cleanser: Soap and Water 1 x Per Week/30 Days Discharge Instructions: Gently cleanse wound with antibacterial soap, rinse and pat dry prior to dressing wounds Cleanser: Wound Cleanser 1 x Per Week/30 Days Discharge Instructions: Wash your hands with soap and water. Remove old dressing, discard into plastic bag and place into trash. Cleanse the wound with Wound Cleanser prior to applying a clean dressing using gauze sponges, not tissues or cotton balls. Do not scrub or use excessive force. Pat dry using gauze sponges, not tissue or cotton balls. Peri-Wound Care: AandD Ointment 1 x Per Week/30 Days Discharge Instructions: Apply AandD Ointment as directed Prim Dressing: Curad Oil Emulsion Dressing 3x3 (in/in) ary 1 x Per Week/30 Days Discharge Instructions: prior to HB Secondary Dressing: Zetuvit Plus 4x8 (in/in) 1 x Per Week/30 Days Compression Wrap: Urgo K2, two layer compression system, regular  1 x Per Week/30 Days Electronic Signature(s) Signed: 07/09/2023 4:50:39 PM By: Betha Loa Signed: 07/09/2023 4:56:48 PM By: [name] Derry PA-C Piet,Signed: 07/09/2023 4:56:48 PM By: Kathrene Alu [name] ([id number]) [id number] Page 6 of  11 Entered By: Betha Loa on 07/09/2023 05:44:44 -------------------------------------------------------------------------------- Problem List Details Patient Name: Date of Service: [name] [name] 07/09/2023 8:15 A M Medical Record Number: [id number] Patient Account Number: [id number] Date of Birth/Sex: Treating RN: 02/03/1946 (77 y.o. 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/05/2023 No Yes I73.89 Other specified peripheral vascular diseases 05/16/2023 No Yes I48.0 Paroxysmal atrial fibrillation 05/16/2023 No Yes I10 Essential (primary) hypertension 05/16/2023 No Yes Inactive Problems Resolved Problems Electronic Signature(s) Signed: 07/09/2023 8:14:38 AM By: [name] Derry PA-C Entered By: [name] on 07/09/2023 05:14:38 [name] ([id number]) [id number] Page 7 of 11 -------------------------------------------------------------------------------- Progress Note Details Patient Name: Date of Service: [name] [name] 07/09/2023 8:15 A M Medical Record Number: [id number] Patient Account Number: [id number] Date of Birth/Sex: Treating RN: Feb 25, 1946 (77 y.o. 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 the patient has had a vascular workup over 7 years ago and said everything was normal at that stage. He does not have any chronic problems except for cardiac issues which he sees a cardiologist in [place]. 08/15/2017 -- arterial and venous duplex studies still pending. 08/23/2017 -- venous reflux studies done on 08/13/2017 shows venous incompetence throughout the left lower extremity deep system and focally at the left saphenofemoral junction. 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 No evidence of SVT or DVT in bilateral lower extremities The patient has an appointment at the end of the month to get his arterial duplex study done 09/05/2017 -- the patient was seen at the vein and vascular office yesterday by Bary Castilla. ABI studies were notable for medial calcification and the toe brachial indices were normal and bilateral ankle-brachial) waveforms were normal with triphasic flow. After review of his venous studies he was not a candidate for laser ablation and his lymphedema was to be treated with compression stockings and lymphedema pump pumps 09/12/2017 -- had a low arterial study done at the [place] [facility] -- unable to obtain reliable ABI is due to medial calcification. Bilateral toe indices were normal with the right being 1.01 and the left being 0.92 and the waveforms were triphasic bilaterally. he did get hold of 30-40 mm compression stockings but is unable to put these on. We will try and get him alternative compression stockings. 09/26/17- he is here in follow up evaluation  of a right lower extremity ulcer;he is compliant in wearing compression  [name], [name] ([id number]) [id number] Page 1 of 11 Visit Report for 07/09/2023 Chief Complaint Document Details Patient Name: Date of Service: [name] [name] 07/09/2023 8:15 A M Medical Record Number: [id number] Patient Account Number: [id number] Date of Birth/Sex: Treating RN: 11/16/1945 (77 y.o. [name] Primary Care Provider: [name] Other Clinician: Betha Loa Referring Provider: Treating Provider/Extender: [name] in Treatment: 7 Information Obtained from: Patient Chief Complaint Left LE Ulcer Electronic Signature(s) Signed: 07/09/2023 8:14:42 AM By: [name] Derry PA-C Entered By: [name] on 07/09/2023 05:14:41 -------------------------------------------------------------------------------- HPI Details Patient Name: Date of Service: [name] 07/09/2023 8:15 A M Medical Record Number: [id number] Patient Account Number: [id number] Date of Birth/Sex: Treating RN: 1946-09-16 (77 y.o. 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 the patient has had a vascular workup over 7 years ago and said everything was normal at that stage. He does not have any chronic problems except for cardiac issues which he sees a cardiologist in [place]. 08/15/2017 -- arterial and venous duplex studies still pending. 08/23/2017 -- venous reflux  studies done on 08/13/2017 shows venous incompetence throughout the left lower extremity deep system and focally at the left saphenofemoral junction. 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 No evidence of SVT or DVT in bilateral lower extremities The patient has an appointment at the end of the month to get his arterial duplex study done 09/05/2017 -- the patient was seen at the vein and vascular office yesterday by Bary Castilla. ABI studies were notable for medial calcification and the toe brachial indices were normal and bilateral ankle-brachial) waveforms were normal with triphasic flow. After review of his venous studies he was not a candidate for laser ablation and his lymphedema was to be treated with compression stockings and lymphedema pump pumps 09/12/2017 -- had a low arterial study done at the [place] [facility] -- unable to obtain reliable ABI is due to medial calcification. Bilateral toe [name], [name] [id number] Page 2 of 11 indices were normal with the right being 1.01 and the left being 0.92 and the waveforms were triphasic bilaterally. he did get hold of 30-40 mm compression stockings but is unable to put these on. We will try and get him alternative compression stockings. 09/26/17- he is here in follow up evaluation of a right lower extremity ulcer;he is compliant in wearing compression stocking; ulcer almost epithelialized , anticipate healing next appointment Readmission: 11/17 point upon evaluation patient's wound currently that he is seeing [place] for today is a skin cancerous lesion that was cleared away by his dermatologist on the left medial calf region. He tells me that this is a very similar thing to what he had done previously in fact the last time he saw him in 2018 this was also what was going on at that point. Nonetheless he feels that based on what he seeing currently that this is just having  a lot of harder time healing although it is much closer to the surface than what he is  Wound Cleanser prior to applying a clean dressing using gauze sponges, not tissues or cotton balls. Do not scrub or use excessive force. Pat dry using gauze sponges, not tissue or cotton balls. Peri-Wound Care: AandD Ointment 1 x Per Week/30 Days Discharge Instructions: Apply AandD Ointment as directed Prim Dressing: Curad Oil Emulsion Dressing 3x3 (in/in) 1 x Per Week/30 Days ary Discharge Instructions: prior to HB Secondary Dressing: Zetuvit Plus 4x8 (in/in) 1 x Per Week/30 Days Com pression Wrap: Urgo K2, two layer compression system, regular 1 x Per [id number] HARMAN, [name]  [id number] Page 11 of 11 1. I am recommend that we continue with the Adaptic followed by the Zetuvit which I think is doing a good job here. 2. I am going to recommend as well that the patient should continue with the compression wrapping for 1 more week I think at that point he should be ready for discharge at that time. 3. I am also going to recommend that the patient continue specifically with the Urgo K2 compression wrap at this point. We will see patient back for reevaluation in 1 week here in the clinic. If anything worsens or changes patient will contact our office for additional recommendations. Electronic Signature(s) Signed: 07/09/2023 10:25:12 AM By: [name] Derry PA-C Entered By: [name] on 07/09/2023 07:25:11 -------------------------------------------------------------------------------- SuperBill Details Patient Name: Date of Service: [name] 07/09/2023 Medical Record Number: [id number] Patient Account Number: [id number] Date of Birth/Sex: Treating RN: [birth year] (77 y.o. 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 I73.89 Other specified peripheral vascular diseases I48.0 Paroxysmal atrial fibrillation I10 Essential (primary) hypertension Facility Procedures : CPT4 Code: [id number] Description: (Facility Use Only) [phone] - APPLY MULTLAY COMPRS LWR LT LEG Modifier: Quantity: 1 Physician Procedures : CPT4 Code Description Modifier [id number] - WC PHYS LEVEL 3 - EST PT ICD-10 Diagnosis Description I89.0  Lymphedema, not elsewhere classified 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  L97.822 Non-pressure chronic ulcer of other part of left lower leg with fat layer exposed I73.89 Other specified peripheral vascular diseases Quantity: 1 Electronic Signature(s) Signed: 07/09/2023 10:25:31 AM By: [name] Derry PA-C Entered By: [name] on 07/09/2023 07:25:30  [name], [name] ([id number]) [id number] Page 1 of 11 Visit Report for 07/09/2023 Chief Complaint Document Details Patient Name: Date of Service: [name] [name] 07/09/2023 8:15 A M Medical Record Number: [id number] Patient Account Number: [id number] Date of Birth/Sex: Treating RN: 11/16/1945 (77 y.o. [name] Primary Care Provider: [name] Other Clinician: Betha Loa Referring Provider: Treating Provider/Extender: [name] in Treatment: 7 Information Obtained from: Patient Chief Complaint Left LE Ulcer Electronic Signature(s) Signed: 07/09/2023 8:14:42 AM By: [name] Derry PA-C Entered By: [name] on 07/09/2023 05:14:41 -------------------------------------------------------------------------------- HPI Details Patient Name: Date of Service: [name] 07/09/2023 8:15 A M Medical Record Number: [id number] Patient Account Number: [id number] Date of Birth/Sex: Treating RN: 1946-09-16 (77 y.o. 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 the patient has had a vascular workup over 7 years ago and said everything was normal at that stage. He does not have any chronic problems except for cardiac issues which he sees a cardiologist in [place]. 08/15/2017 -- arterial and venous duplex studies still pending. 08/23/2017 -- venous reflux  studies done on 08/13/2017 shows venous incompetence throughout the left lower extremity deep system and focally at the left saphenofemoral junction. 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 No evidence of SVT or DVT in bilateral lower extremities The patient has an appointment at the end of the month to get his arterial duplex study done 09/05/2017 -- the patient was seen at the vein and vascular office yesterday by Bary Castilla. ABI studies were notable for medial calcification and the toe brachial indices were normal and bilateral ankle-brachial) waveforms were normal with triphasic flow. After review of his venous studies he was not a candidate for laser ablation and his lymphedema was to be treated with compression stockings and lymphedema pump pumps 09/12/2017 -- had a low arterial study done at the [place] [facility] -- unable to obtain reliable ABI is due to medial calcification. Bilateral toe [name], [name] [id number] Page 2 of 11 indices were normal with the right being 1.01 and the left being 0.92 and the waveforms were triphasic bilaterally. he did get hold of 30-40 mm compression stockings but is unable to put these on. We will try and get him alternative compression stockings. 09/26/17- he is here in follow up evaluation of a right lower extremity ulcer;he is compliant in wearing compression stocking; ulcer almost epithelialized , anticipate healing next appointment Readmission: 11/17 point upon evaluation patient's wound currently that he is seeing [place] for today is a skin cancerous lesion that was cleared away by his dermatologist on the left medial calf region. He tells me that this is a very similar thing to what he had done previously in fact the last time he saw him in 2018 this was also what was going on at that point. Nonetheless he feels that based on what he seeing currently that this is just having  a lot of harder time healing although it is much closer to the surface than what he is  1 x Per Week/30 Days Electronic Signature(s) Signed: 07/09/2023 4:50:39 PM By: Betha Loa Signed: 07/09/2023 4:56:48 PM By: [name] Derry PA-C Piet,Signed: 07/09/2023 4:56:48 PM By: Kathrene Alu [name] ([id number]) [id number] Page 6 of  11 Entered By: Betha Loa on 07/09/2023 05:44:44 -------------------------------------------------------------------------------- Problem List Details Patient Name: Date of Service: [name] [name] 07/09/2023 8:15 A M Medical Record Number: [id number] Patient Account Number: [id number] Date of Birth/Sex: Treating RN: 02/03/1946 (77 y.o. 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/05/2023 No Yes I73.89 Other specified peripheral vascular diseases 05/16/2023 No Yes I48.0 Paroxysmal atrial fibrillation 05/16/2023 No Yes I10 Essential (primary) hypertension 05/16/2023 No Yes Inactive Problems Resolved Problems Electronic Signature(s) Signed: 07/09/2023 8:14:38 AM By: [name] Derry PA-C Entered By: [name] on 07/09/2023 05:14:38 [name] ([id number]) [id number] Page 7 of 11 -------------------------------------------------------------------------------- Progress Note Details Patient Name: Date of Service: [name] [name] 07/09/2023 8:15 A M Medical Record Number: [id number] Patient Account Number: [id number] Date of Birth/Sex: Treating RN: Feb 25, 1946 (77 y.o. 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 the patient has had a vascular workup over 7 years ago and said everything was normal at that stage. He does not have any chronic problems except for cardiac issues which he sees a cardiologist in [place]. 08/15/2017 -- arterial and venous duplex studies still pending. 08/23/2017 -- venous reflux studies done on 08/13/2017 shows venous incompetence throughout the left lower extremity deep system and focally at the left saphenofemoral junction. 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 No evidence of SVT or DVT in bilateral lower extremities The patient has an appointment at the end of the month to get his arterial duplex study done 09/05/2017 -- the patient was seen at the vein and vascular office yesterday by Bary Castilla. ABI studies were notable for medial calcification and the toe brachial indices were normal and bilateral ankle-brachial) waveforms were normal with triphasic flow. After review of his venous studies he was not a candidate for laser ablation and his lymphedema was to be treated with compression stockings and lymphedema pump pumps 09/12/2017 -- had a low arterial study done at the [facility] -- unable to obtain reliable ABI is due to medial calcification. Bilateral toe indices were normal with the right being 1.01 and the left being 0.92 and the waveforms were triphasic bilaterally. he did get hold of 30-40 mm compression stockings but is unable to put these on. We will try and get him alternative compression stockings. 09/26/17- he is here in follow up evaluation  of a right lower extremity ulcer;he is compliant in wearing compression  [name], [name] ([id number]) [id number] Page 1 of 11 Visit Report for 07/09/2023 Chief Complaint Document Details Patient Name: Date of Service: [name] [name] 07/09/2023 8:15 A M Medical Record Number: [id number] Patient Account Number: [id number] Date of Birth/Sex: Treating RN: 11/16/1945 (77 y.o. [name] Primary Care Provider: [name] Other Clinician: Betha Loa Referring Provider: Treating Provider/Extender: [name] in Treatment: 7 Information Obtained from: Patient Chief Complaint Left LE Ulcer Electronic Signature(s) Signed: 07/09/2023 8:14:42 AM By: [name] Derry PA-C Entered By: [name] on 07/09/2023 05:14:41 -------------------------------------------------------------------------------- HPI Details Patient Name: Date of Service: [name] 07/09/2023 8:15 A M Medical Record Number: [id number] Patient Account Number: [id number] Date of Birth/Sex: Treating RN: 1946-09-16 (77 y.o. 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 the patient has had a vascular workup over 7 years ago and said everything was normal at that stage. He does not have any chronic problems except for cardiac issues which he sees a cardiologist in [place]. 08/15/2017 -- arterial and venous duplex studies still pending. 08/23/2017 -- venous reflux  studies done on 08/13/2017 shows venous incompetence throughout the left lower extremity deep system and focally at the left saphenofemoral junction. 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 No evidence of SVT or DVT in bilateral lower extremities The patient has an appointment at the end of the month to get his arterial duplex study done 09/05/2017 -- the patient was seen at the vein and vascular office yesterday by Bary Castilla. ABI studies were notable for medial calcification and the toe brachial indices were normal and bilateral ankle-brachial) waveforms were normal with triphasic flow. After review of his venous studies he was not a candidate for laser ablation and his lymphedema was to be treated with compression stockings and lymphedema pump pumps 09/12/2017 -- had a low arterial study done at the [facility] [place] [facility] -- unable to obtain reliable ABI is due to medial calcification. Bilateral toe [name], [name] [id number] Page 2 of 11 indices were normal with the right being 1.01 and the left being 0.92 and the waveforms were triphasic bilaterally. he did get hold of 30-40 mm compression stockings but is unable to put these on. We will try and get him alternative compression stockings. 09/26/17- he is here in follow up evaluation of a right lower extremity ulcer;he is compliant in wearing compression stocking; ulcer almost epithelialized , anticipate healing next appointment Readmission: 11/17 point upon evaluation patient's wound currently that he is seeing [place] for today is a skin cancerous lesion that was cleared away by his dermatologist on the left medial calf region. He tells me that this is a very similar thing to what he had done previously in fact the last time he saw him in 2018 this was also what was going on at that point. Nonetheless he feels that based on what he seeing currently that this is just having  a lot of harder time healing although it is much closer to the surface than what he is  1 x Per Week/30 Days Electronic Signature(s) Signed: 07/09/2023 4:50:39 PM By: Betha Loa Signed: 07/09/2023 4:56:48 PM By: [name] Derry PA-C Piet,Signed: 07/09/2023 4:56:48 PM By: Kathrene Alu [name] ([id number]) [id number] Page 6 of  11 Entered By: Betha Loa on 07/09/2023 05:44:44 -------------------------------------------------------------------------------- Problem List Details Patient Name: Date of Service: [name] [name] 07/09/2023 8:15 A M Medical Record Number: [id number] Patient Account Number: [id number] Date of Birth/Sex: Treating RN: 02/03/1946 (77 y.o. 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/05/2023 No Yes I73.89 Other specified peripheral vascular diseases 05/16/2023 No Yes I48.0 Paroxysmal atrial fibrillation 05/16/2023 No Yes I10 Essential (primary) hypertension 05/16/2023 No Yes Inactive Problems Resolved Problems Electronic Signature(s) Signed: 07/09/2023 8:14:38 AM By: [name] Derry PA-C Entered By: [name] on 07/09/2023 05:14:38 [name] ([id number]) [id number] Page 7 of 11 -------------------------------------------------------------------------------- Progress Note Details Patient Name: Date of Service: [name] [name] 07/09/2023 8:15 A M Medical Record Number: [id number] Patient Account Number: [id number] Date of Birth/Sex: Treating RN: Feb 25, 1946 (77 y.o. 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 the patient has had a vascular workup over 7 years ago and said everything was normal at that stage. He does not have any chronic problems except for cardiac issues which he sees a cardiologist in [place]. 08/15/2017 -- arterial and venous duplex studies still pending. 08/23/2017 -- venous reflux studies done on 08/13/2017 shows venous incompetence throughout the left lower extremity deep system and focally at the left saphenofemoral junction. 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 No evidence of SVT or DVT in bilateral lower extremities The patient has an appointment at the end of the month to get his arterial duplex study done 09/05/2017 -- the patient was seen at the vein and vascular office yesterday by Bary Castilla. ABI studies were notable for medial calcification and the toe brachial indices were normal and bilateral ankle-brachial) waveforms were normal with triphasic flow. After review of his venous studies he was not a candidate for laser ablation and his lymphedema was to be treated with compression stockings and lymphedema pump pumps 09/12/2017 -- had a low arterial study done at the [facility] -- unable to obtain reliable ABI is due to medial calcification. Bilateral toe indices were normal with the right being 1.01 and the left being 0.92 and the waveforms were triphasic bilaterally. he did get hold of 30-40 mm compression stockings but is unable to put these on. We will try and get him alternative compression stockings. 09/26/17- he is here in follow up evaluation  of a right lower extremity ulcer;he is compliant in wearing compression  1 x Per Week/30 Days Electronic Signature(s) Signed: 07/09/2023 4:50:39 PM By: Betha Loa Signed: 07/09/2023 4:56:48 PM By: [name] Derry PA-C Piet,Signed: 07/09/2023 4:56:48 PM By: Kathrene Alu [name] ([id number]) [id number] Page 6 of  11 Entered By: Betha Loa on 07/09/2023 05:44:44 -------------------------------------------------------------------------------- Problem List Details Patient Name: Date of Service: [name] [name] 07/09/2023 8:15 A M Medical Record Number: [id number] Patient Account Number: [id number] Date of Birth/Sex: Treating RN: 02/03/1946 (77 y.o. 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/05/2023 No Yes I73.89 Other specified peripheral vascular diseases 05/16/2023 No Yes I48.0 Paroxysmal atrial fibrillation 05/16/2023 No Yes I10 Essential (primary) hypertension 05/16/2023 No Yes Inactive Problems Resolved Problems Electronic Signature(s) Signed: 07/09/2023 8:14:38 AM By: [name] Derry PA-C Entered By: [name] on 07/09/2023 05:14:38 [name] ([id number]) [id number] Page 7 of 11 -------------------------------------------------------------------------------- Progress Note Details Patient Name: Date of Service: [name] [name] 07/09/2023 8:15 A M Medical Record Number: [id number] Patient Account Number: [id number] Date of Birth/Sex: Treating RN: Feb 25, 1946 (77 y.o. 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 the patient has had a vascular workup over 7 years ago and said everything was normal at that stage. He does not have any chronic problems except for cardiac issues which he sees a cardiologist in [place]. 08/15/2017 -- arterial and venous duplex studies still pending. 08/23/2017 -- venous reflux studies done on 08/13/2017 shows venous incompetence throughout the left lower extremity deep system and focally at the left saphenofemoral junction. 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 No evidence of SVT or DVT in bilateral lower extremities The patient has an appointment at the end of the month to get his arterial duplex study done 09/05/2017 -- the patient was seen at the vein and vascular office yesterday by Bary Castilla. ABI studies were notable for medial calcification and the toe brachial indices were normal and bilateral ankle-brachial) waveforms were normal with triphasic flow. After review of his venous studies he was not a candidate for laser ablation and his lymphedema was to be treated with compression stockings and lymphedema pump pumps 09/12/2017 -- had a low arterial study done at the [facility] -- unable to obtain reliable ABI is due to medial calcification. Bilateral toe indices were normal with the right being 1.01 and the left being 0.92 and the waveforms were triphasic bilaterally. he did get hold of 30-40 mm compression stockings but is unable to put these on. We will try and get him alternative compression stockings. 09/26/17- he is here in follow up evaluation  of a right lower extremity ulcer;he is compliant in wearing compression  [name], [name] ([id number]) [id number] Page 1 of 11 Visit Report for 07/09/2023 Chief Complaint Document Details Patient Name: Date of Service: [name] [name] 07/09/2023 8:15 A M Medical Record Number: [id number] Patient Account Number: [id number] Date of Birth/Sex: Treating RN: 11/16/1945 (77 y.o. [name] Primary Care Provider: [name] Other Clinician: Betha Loa Referring Provider: Treating Provider/Extender: [name] in Treatment: 7 Information Obtained from: Patient Chief Complaint Left LE Ulcer Electronic Signature(s) Signed: 07/09/2023 8:14:42 AM By: [name] Derry PA-C Entered By: [name] on 07/09/2023 05:14:41 -------------------------------------------------------------------------------- HPI Details Patient Name: Date of Service: [name] 07/09/2023 8:15 A M Medical Record Number: [id number] Patient Account Number: [id number] Date of Birth/Sex: Treating RN: 1946-09-16 (77 y.o. 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 the patient has had a vascular workup over 7 years ago and said everything was normal at that stage. He does not have any chronic problems except for cardiac issues which he sees a cardiologist in [place]. 08/15/2017 -- arterial and venous duplex studies still pending. 08/23/2017 -- venous reflux  studies done on 08/13/2017 shows venous incompetence throughout the left lower extremity deep system and focally at the left saphenofemoral junction. 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 No evidence of SVT or DVT in bilateral lower extremities The patient has an appointment at the end of the month to get his arterial duplex study done 09/05/2017 -- the patient was seen at the vein and vascular office yesterday by Bary Castilla. ABI studies were notable for medial calcification and the toe brachial indices were normal and bilateral ankle-brachial) waveforms were normal with triphasic flow. After review of his venous studies he was not a candidate for laser ablation and his lymphedema was to be treated with compression stockings and lymphedema pump pumps 09/12/2017 -- had a low arterial study done at the [facility] [facility] -- unable to obtain reliable ABI is due to medial calcification. Bilateral toe [name], [name] [id number] Page 2 of 11 indices were normal with the right being 1.01 and the left being 0.92 and the waveforms were triphasic bilaterally. he did get hold of 30-40 mm compression stockings but is unable to put these on. We will try and get him alternative compression stockings. 09/26/17- he is here in follow up evaluation of a right lower extremity ulcer;he is compliant in wearing compression stocking; ulcer almost epithelialized , anticipate healing next appointment Readmission: 11/17 point upon evaluation patient's wound currently that he is seeing [place] for today is a skin cancerous lesion that was cleared away by his dermatologist on the left medial calf region. He tells me that this is a very similar thing to what he had done previously in fact the last time he saw him in 2018 this was also what was going on at that point. Nonetheless he feels that based on what he seeing currently that this is just having  a lot of harder time healing although it is much closer to the surface than what he is

## 2023-07-16 ENCOUNTER — Encounter: Payer: Medicare PPO | Admitting: Physician Assistant

## 2023-07-16 DIAGNOSIS — M109 Gout, unspecified: Secondary | ICD-10-CM | POA: Diagnosis not present

## 2023-07-16 DIAGNOSIS — I7389 Other specified peripheral vascular diseases: Secondary | ICD-10-CM | POA: Diagnosis not present

## 2023-07-16 DIAGNOSIS — I10 Essential (primary) hypertension: Secondary | ICD-10-CM | POA: Diagnosis not present

## 2023-07-16 DIAGNOSIS — I87333 Chronic venous hypertension (idiopathic) with ulcer and inflammation of bilateral lower extremity: Secondary | ICD-10-CM | POA: Diagnosis not present

## 2023-07-16 DIAGNOSIS — I480 Paroxysmal atrial fibrillation: Secondary | ICD-10-CM | POA: Diagnosis not present

## 2023-07-16 DIAGNOSIS — L97822 Non-pressure chronic ulcer of other part of left lower leg with fat layer exposed: Secondary | ICD-10-CM | POA: Diagnosis not present

## 2023-07-16 DIAGNOSIS — Z7901 Long term (current) use of anticoagulants: Secondary | ICD-10-CM | POA: Diagnosis not present

## 2023-07-16 DIAGNOSIS — I890 Lymphedema, not elsewhere classified: Secondary | ICD-10-CM | POA: Diagnosis not present

## 2023-07-16 DIAGNOSIS — L958 Other vasculitis limited to the skin: Secondary | ICD-10-CM | POA: Diagnosis not present

## 2023-07-16 NOTE — Progress Notes (Signed)
[name], [name] ([id number]) [id number] Page 1 of 11 Visit Report for 07/16/2023 Chief Complaint Document Details Patient Name: Date of Service: [name] [name] 07/16/2023 1:30 PM Medical Record Number: [id number] Patient Account Number: [id number] Date of Birth/Sex: Treating RN: 1946-04-19 (77 y.o. [name]) Yevonne Pax Primary Care Provider: [name] Other Clinician: Referring Provider: Treating Provider/Extender: [name] in Treatment: 8 Information Obtained from: Patient Chief Complaint Left LE Ulcer Electronic Signature(s) Signed: 07/16/2023 1:29:55 PM By: Allen Derry PA-C Entered By: Allen Derry on 07/16/2023 13:29:55 -------------------------------------------------------------------------------- HPI Details Patient Name: Date of Service: [name], [name] 07/16/2023 1:30 PM Medical Record Number: [id number] Patient Account Number: [id number] Date of Birth/Sex: Treating RN: 1946-05-28 (77 y.o. 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 the patient has had [name] vascular workup over 7 years ago and said everything was normal at that stage. He does not have any chronic problems except for cardiac issues which he sees [name] cardiologist in [place]. 08/15/2017 -- arterial and venous duplex studies still pending. 08/23/2017 -- venous reflux studies done on 08/13/2017 shows  venous incompetence throughout the left lower extremity deep system and focally at the left saphenofemoral junction. 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 No evidence of SVT or DVT in bilateral lower extremities The patient has an appointment at the end of the month to get his arterial duplex study done 09/05/2017 -- the patient was seen at the vein and vascular office yesterday by Bary Castilla. ABI studies were notable for medial calcification and the toe brachial indices were normal and bilateral ankle-brachial) waveforms were normal with triphasic flow. After review of his venous studies he was not [name] candidate for laser ablation and his lymphedema was to be treated with compression stockings and lymphedema pump pumps 09/12/2017 -- had [name] low arterial study done at the [facility] -- unable to obtain reliable ABI is due to medial calcification. Bilateral toe [name], [name] [id number] Page 2 of 11 indices were normal with the right being 1.01 and the left being 0.92 and the waveforms were triphasic bilaterally. he did get hold of 30-40 mm compression stockings but is unable to put these on. We will try and get him alternative compression stockings. 09/26/17- he is here in follow up evaluation of [name] right lower extremity ulcer;he is compliant in wearing compression stocking; ulcer almost epithelialized , anticipate healing next appointment Readmission: 11/17 point upon evaluation patient's wound currently that he is seeing [place] for today is [name] skin cancerous lesion that was cleared away by his dermatologist on the left medial calf region. He tells me that this is [name] very similar thing to what he had done previously in fact the last time he saw him in 2018 this was also what was going on at that point. Nonetheless he feels that based on what he seeing currently that this is just having [name] lot of harder time healing  although it is much closer to the surface than what he is experienced in the past. He notes  that I think needs 1 week to toughen up before we cut him loose completely. 07-16-2023 upon evaluation today patient appears to be doing well currently in regard to his wounds. In fact everything is completely healed and looks to be doing great. Fortunately I do not see any signs of active infection at this time which is great news. No fevers, chills, nausea, vomiting, or diarrhea. Electronic Signature(s) Signed: 07/16/2023 2:02:32 PM By: Allen Derry PA-C Entered By: Allen Derry on 07/16/2023  14:02:32 -------------------------------------------------------------------------------- Physical Exam Details Patient Name: Date of Service: [name], [name] 07/16/2023 1:30 PM Medical Record Number: [id number] Patient Account Number: [id number] Date of Birth/Sex: Treating RN: [birth year] (77 y.o. [name] Primary Care Provider: [name] Other Clinician: Referring Provider: Treating Provider/Extender: [name] in Treatment: 8 Constitutional Well-nourished and well-hydrated in no acute distress. Respiratory normal breathing without difficulty. Psychiatric this patient is able to make decisions and demonstrates good insight into disease process. Alert and Oriented x 3. pleasant and cooperative. Notes Upon inspection patient's wound bed showed signs of excellent epithelization no open wounds and in general he seems to really be doing quite well. I do not see any signs of infection I think he is ready to get into his compression socks. [name], [name] ([id number]) [id number] Page 5 of 11 Electronic Signature(s) Signed: 07/16/2023 2:02:47 PM By: Allen Derry PA-C Entered By: Allen Derry on 07/16/2023 14:02:47 -------------------------------------------------------------------------------- Physician Orders Details Patient Name: Date of Service: [name], [name] 07/16/2023 1:30 PM Medical Record Number: [id number] Patient Account Number: [id number] Date of Birth/Sex: Treating RN: Jan 07, 1946 (77 y.o. 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 Edema Control - Lymphedema / Segmental Compressive Device / Other Patient to wear own compression stockings. Remove compression stockings every night before going to bed and put on every morning when getting up. Elevate, Exercise Daily and [name] void Standing for Long Periods of Time. Elevate legs to the level of the heart and pump ankles as often as possible Elevate leg(s) parallel to the floor when sitting. Electronic Signature(s) Signed: 07/17/2023 11:22:48 AM By: Yevonne Pax RN Signed: 07/17/2023 4:58:41 PM By: Allen Derry PA-C Entered By: Yevonne Pax on 07/17/2023 11:22:48 -------------------------------------------------------------------------------- Problem List Details Patient Name: Date of Service: [name] [name], [name] 07/16/2023 1:30 PM Medical Record Number: [id number] Patient Account Number: [id number] Date of Birth/Sex: Treating RN: [birth year] (77 y.o. 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 Objective Constitutional Well-nourished and well-hydrated in no acute distress. Vitals Time Taken: 1:42 PM, Height: 75 in, Weight: 220 lbs, BMI: 27.5, Temperature: 97.8 F, Pulse: 69 bpm, Respiratory Rate: 18 breaths/min, Blood Pressure: 190/68 mmHg. Respiratory normal breathing without difficulty. Psychiatric this patient is able to make decisions and demonstrates good insight into disease process. Alert and Oriented x 3. pleasant and cooperative. General Notes: Upon inspection patient's wound bed showed signs of excellent epithelization no open wounds and in general he seems to  really be doing quite well. I do not see any signs of infection I think he is ready to get into his compression socks. Integumentary (Hair, Skin) Wound #21 status is Healed - Epithelialized. Original cause of wound was Gradually Appeared. The date acquired was: 04/08/2023. The wound has been in treatment 8 weeks. The wound is located on the Left,Lateral Lower Leg. The wound measures 0cm length x 0cm width x 0cm depth; 0cm^2 area and 0cm^3 volume. There is no tunneling or undermining noted. There is [name] none present amount of drainage noted. There is no granulation within the wound bed. There is no necrotic tissue within the wound bed. 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 I would recommend currently that we have the patient continue to monitor for any signs of infection or worsening. We will have [name] 2-week follow-up just to make sure that he maintains closure. 2. I am good recommend as well that the patient should continue to elevate his legs. He also should be wearing compression stockings. We will see him back for [name] follow-up visit in 2 weeks to see where things stand and hopefully he will remain closed. Electronic Signature(s) Signed: 07/16/2023 2:03:40 PM By: Allen Derry PA-C Entered By: Allen Derry on 07/16/2023 14:03:39 -------------------------------------------------------------------------------- SuperBill Details Patient Name: Date of Service: [name] [name], [name] 07/16/2023 Medical Record Number: [id number] Patient Account Number: [id number] Date of Birth/Sex: Treating  RN: 1945-12-22 (77 y.o. [name]) Yevonne Pax Primary Care Provider: [name] Other Clinician: Referring Provider: Treating Provider/Extender: [name] in Treatment: 8 Diagnosis Coding ICD-10 Codes Code Description I89.0 Lymphedema, not elsewhere classified 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 L97.822 Non-pressure chronic ulcer of other part of left lower leg with fat layer exposed I73.89 Other specified peripheral vascular diseases I48.0 Paroxysmal atrial fibrillation I10 Essential (primary) hypertension Facility Procedures : CPT4 Code: 47425956 Description: 38756 - WOUND CARE VISIT-LEV 2 EST PT Modifier: Quantity: 1 Physician Procedures : CPT4 Code Description Modifier [id number] - WC PHYS LEVEL 3 - EST PT [name], [name] ([id number]) [id number] ICD-10 Diagnosis Description I89.0 Lymphedema, not elsewhere classified I87.333 Chronic venous hypertension  (idiopathic) with ulcer and inflammation of bilateral lower extremity L97.822 Non-pressure chronic ulcer of other part of left lower leg with fat layer exposed I73.89 Other specified peripheral vascular diseases Quantity: 1 df Page 11 of 11 Electronic Signature(s) Signed: 07/17/2023 11:52:15 AM By: Yevonne Pax RN Signed: 07/17/2023 4:58:41 PM By: Allen Derry PA-C Previous Signature: 07/16/2023 2:04:36 PM Version By: Allen Derry PA-C Entered By: Yevonne Pax on 07/17/2023 11:52:15  nausea, vomiting, or diarrhea. Objective Constitutional Well-nourished and well-hydrated in no acute distress. Vitals Time Taken: 1:42 PM, Height: 75 in, Weight: 220 lbs, BMI: 27.5, Temperature: 97.8 F, Pulse: 69 bpm, Respiratory Rate: 18 breaths/min, Blood Pressure: 190/68 mmHg. Respiratory normal breathing without difficulty. Psychiatric this patient is able to make decisions and demonstrates good insight into disease process. Alert and Oriented x 3. pleasant and cooperative. General Notes: Upon inspection patient's wound bed showed signs of excellent epithelization no open wounds and in general he seems to  really be doing quite well. I do not see any signs of infection I think he is ready to get into his compression socks. Integumentary (Hair, Skin) Wound #21 status is Healed - Epithelialized. Original cause of wound was Gradually Appeared. The date acquired was: 04/08/2023. The wound has been in treatment 8 weeks. The wound is located on the Left,Lateral Lower Leg. The wound measures 0cm length x 0cm width x 0cm depth; 0cm^2 area and 0cm^3 volume. There is no tunneling or undermining noted. There is [name] none present amount of drainage noted. There is no granulation within the wound bed. There is no necrotic tissue within the wound bed. 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 I would recommend currently that we have the patient continue to monitor for any signs of infection or worsening. We will have [name] 2-week follow-up just to make sure that he maintains closure. 2. I am good recommend as well that the patient should continue to elevate his legs. He also should be wearing compression stockings. We will see him back for [name] follow-up visit in 2 weeks to see where things stand and hopefully he will remain closed. Electronic Signature(s) Signed: 07/16/2023 2:03:40 PM By: Allen Derry PA-C Entered By: Allen Derry on 07/16/2023 14:03:39 -------------------------------------------------------------------------------- SuperBill Details Patient Name: Date of Service: [name] [name], [name] 07/16/2023 Medical Record Number: [id number] Patient Account Number: [id number] Date of Birth/Sex: Treating  RN: 1945-12-22 (77 y.o. [name]) Yevonne Pax Primary Care Provider: [name] Other Clinician: Referring Provider: Treating Provider/Extender: [name] in Treatment: 8 Diagnosis Coding ICD-10 Codes Code Description I89.0 Lymphedema, not elsewhere classified 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 L97.822 Non-pressure chronic ulcer of other part of left lower leg with fat layer exposed I73.89 Other specified peripheral vascular diseases I48.0 Paroxysmal atrial fibrillation I10 Essential (primary) hypertension Facility Procedures : CPT4 Code: 47425956 Description: 38756 - WOUND CARE VISIT-LEV 2 EST PT Modifier: Quantity: 1 Physician Procedures : CPT4 Code Description Modifier [id number] 99213 - WC PHYS LEVEL 3 - EST PT [name], [name] ([id number]) [id number] ICD-10 Diagnosis Description I89.0 Lymphedema, not elsewhere classified I87.333 Chronic venous hypertension  (idiopathic) with ulcer and inflammation of bilateral lower extremity L97.822 Non-pressure chronic ulcer of other part of left lower leg with fat layer exposed I73.89 Other specified peripheral vascular diseases Quantity: 1 df Page 11 of 11 Electronic Signature(s) Signed: 07/17/2023 11:52:15 AM By: Yevonne Pax RN Signed: 07/17/2023 4:58:41 PM By: Allen Derry PA-C Previous Signature: 07/16/2023 2:04:36 PM Version By: Allen Derry PA-C Entered By: Yevonne Pax on 07/17/2023 11:52:15  nausea, vomiting, or diarrhea. Objective Constitutional Well-nourished and well-hydrated in no acute distress. Vitals Time Taken: 1:42 PM, Height: 75 in, Weight: 220 lbs, BMI: 27.5, Temperature: 97.8 F, Pulse: 69 bpm, Respiratory Rate: 18 breaths/min, Blood Pressure: 190/68 mmHg. Respiratory normal breathing without difficulty. Psychiatric this patient is able to make decisions and demonstrates good insight into disease process. Alert and Oriented x 3. pleasant and cooperative. General Notes: Upon inspection patient's wound bed showed signs of excellent epithelization no open wounds and in general he seems to  really be doing quite well. I do not see any signs of infection I think he is ready to get into his compression socks. Integumentary (Hair, Skin) Wound #21 status is Healed - Epithelialized. Original cause of wound was Gradually Appeared. The date acquired was: 04/08/2023. The wound has been in treatment 8 weeks. The wound is located on the Left,Lateral Lower Leg. The wound measures 0cm length x 0cm width x 0cm depth; 0cm^2 area and 0cm^3 volume. There is no tunneling or undermining noted. There is [name] none present amount of drainage noted. There is no granulation within the wound bed. There is no necrotic tissue within the wound bed. 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 I would recommend currently that we have the patient continue to monitor for any signs of infection or worsening. We will have [name] 2-week follow-up just to make sure that he maintains closure. 2. I am good recommend as well that the patient should continue to elevate his legs. He also should be wearing compression stockings. We will see him back for [name] follow-up visit in 2 weeks to see where things stand and hopefully he will remain closed. Electronic Signature(s) Signed: 07/16/2023 2:03:40 PM By: Allen Derry PA-C Entered By: Allen Derry on 07/16/2023 14:03:39 -------------------------------------------------------------------------------- SuperBill Details Patient Name: Date of Service: [name] [name], [name] 07/16/2023 Medical Record Number: [id number] Patient Account Number: [id number] Date of Birth/Sex: Treating  RN: 1945-12-22 (77 y.o. [name]) Yevonne Pax Primary Care Provider: [name] Other Clinician: Referring Provider: Treating Provider/Extender: [name] in Treatment: 8 Diagnosis Coding ICD-10 Codes Code Description I89.0 Lymphedema, not elsewhere classified 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 L97.822 Non-pressure chronic ulcer of other part of left lower leg with fat layer exposed I73.89 Other specified peripheral vascular diseases I48.0 Paroxysmal atrial fibrillation I10 Essential (primary) hypertension Facility Procedures : CPT4 Code: 47425956 Description: 38756 - WOUND CARE VISIT-LEV 2 EST PT Modifier: Quantity: 1 Physician Procedures : CPT4 Code Description Modifier [id number] 99213 - WC PHYS LEVEL 3 - EST PT [name], [name] ([id number]) [id number] ICD-10 Diagnosis Description I89.0 Lymphedema, not elsewhere classified I87.333 Chronic venous hypertension  (idiopathic) with ulcer and inflammation of bilateral lower extremity L97.822 Non-pressure chronic ulcer of other part of left lower leg with fat layer exposed I73.89 Other specified peripheral vascular diseases Quantity: 1 df Page 11 of 11 Electronic Signature(s) Signed: 07/17/2023 11:52:15 AM By: Yevonne Pax RN Signed: 07/17/2023 4:58:41 PM By: Allen Derry PA-C Previous Signature: 07/16/2023 2:04:36 PM Version By: Allen Derry PA-C Entered By: Yevonne Pax on 07/17/2023 11:52:15  nausea, vomiting, or diarrhea. Objective Constitutional Well-nourished and well-hydrated in no acute distress. Vitals Time Taken: 1:42 PM, Height: 75 in, Weight: 220 lbs, BMI: 27.5, Temperature: 97.8 F, Pulse: 69 bpm, Respiratory Rate: 18 breaths/min, Blood Pressure: 190/68 mmHg. Respiratory normal breathing without difficulty. Psychiatric this patient is able to make decisions and demonstrates good insight into disease process. Alert and Oriented x 3. pleasant and cooperative. General Notes: Upon inspection patient's wound bed showed signs of excellent epithelization no open wounds and in general he seems to  really be doing quite well. I do not see any signs of infection I think he is ready to get into his compression socks. Integumentary (Hair, Skin) Wound #21 status is Healed - Epithelialized. Original cause of wound was Gradually Appeared. The date acquired was: 04/08/2023. The wound has been in treatment 8 weeks. The wound is located on the Left,Lateral Lower Leg. The wound measures 0cm length x 0cm width x 0cm depth; 0cm^2 area and 0cm^3 volume. There is no tunneling or undermining noted. There is [name] none present amount of drainage noted. There is no granulation within the wound bed. There is no necrotic tissue within the wound bed. 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 I would recommend currently that we have the patient continue to monitor for any signs of infection or worsening. We will have [name] 2-week follow-up just to make sure that he maintains closure. 2. I am good recommend as well that the patient should continue to elevate his legs. He also should be wearing compression stockings. We will see him back for [name] follow-up visit in 2 weeks to see where things stand and hopefully he will remain closed. Electronic Signature(s) Signed: 07/16/2023 2:03:40 PM By: Allen Derry PA-C Entered By: Allen Derry on 07/16/2023 14:03:39 -------------------------------------------------------------------------------- SuperBill Details Patient Name: Date of Service: [name] [name], [name] 07/16/2023 Medical Record Number: [id number] Patient Account Number: [id number] Date of Birth/Sex: Treating  RN: 1945-12-22 (77 y.o. [name]) Yevonne Pax Primary Care Provider: [name] Other Clinician: Referring Provider: Treating Provider/Extender: [name] in Treatment: 8 Diagnosis Coding ICD-10 Codes Code Description I89.0 Lymphedema, not elsewhere classified 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 L97.822 Non-pressure chronic ulcer of other part of left lower leg with fat layer exposed I73.89 Other specified peripheral vascular diseases I48.0 Paroxysmal atrial fibrillation I10 Essential (primary) hypertension Facility Procedures : CPT4 Code: 47425956 Description: 38756 - WOUND CARE VISIT-LEV 2 EST PT Modifier: Quantity: 1 Physician Procedures : CPT4 Code Description Modifier [id number] - WC PHYS LEVEL 3 - EST PT [name], [name] ([id number]) [id number] ICD-10 Diagnosis Description I89.0 Lymphedema, not elsewhere classified 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 07/16/2023 1:30 PM Medical Record Number: [id number] Patient Account Number: [id number] Date of Birth/Sex: Treating RN: 1946-04-19 (77 y.o. [name]) Yevonne Pax Primary Care Provider: [name] Other Clinician: Referring Provider: Treating Provider/Extender: [name] in Treatment: 8 Information Obtained from: Patient Chief Complaint Left LE Ulcer Electronic Signature(s) Signed: 07/16/2023 1:29:55 PM By: Allen Derry PA-C Entered By: Allen Derry on 07/16/2023 13:29:55 -------------------------------------------------------------------------------- HPI Details Patient Name: Date of Service: [name], [name] 07/16/2023 1:30 PM Medical Record Number: [id number] Patient Account Number: [id number] Date of Birth/Sex: Treating RN: 1946-05-28 (77 y.o. 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 the patient has had [name] vascular workup over 7 years ago and said everything was normal at that stage. He does not have any chronic problems except for cardiac issues which he sees [name] cardiologist in [place]. 08/15/2017 -- arterial and venous duplex studies still pending. 08/23/2017 -- venous reflux studies done on 08/13/2017 shows  venous incompetence throughout the left lower extremity deep system and focally at the left saphenofemoral junction. 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 No evidence of SVT or DVT in bilateral lower extremities The patient has an appointment at the end of the month to get his arterial duplex study done 09/05/2017 -- the patient was seen at the vein and vascular office yesterday by Bary Castilla. ABI studies were notable for medial calcification and the toe brachial indices were normal and bilateral ankle-brachial) waveforms were normal with triphasic flow. After review of his venous studies he was not [name] candidate for laser ablation and his lymphedema was to be treated with compression stockings and lymphedema pump pumps 09/12/2017 -- had [name] low arterial study done at the [facility] -- unable to obtain reliable ABI is due to medial calcification. Bilateral toe [name], [name] [id number] Page 2 of 11 indices were normal with the right being 1.01 and the left being 0.92 and the waveforms were triphasic bilaterally. he did get hold of 30-40 mm compression stockings but is unable to put these on. We will try and get him alternative compression stockings. 09/26/17- he is here in follow up evaluation of [name] right lower extremity ulcer;he is compliant in wearing compression stocking; ulcer almost epithelialized , anticipate healing next appointment Readmission: 11/17 point upon evaluation patient's wound currently that he is seeing [place] for today is [name] skin cancerous lesion that was cleared away by his dermatologist on the left medial calf region. He tells me that this is [name] very similar thing to what he had done previously in fact the last time he saw him in 2018 this was also what was going on at that point. Nonetheless he feels that based on what he seeing currently that this is just having [name] lot of harder time healing  although it is much closer to the surface than what he is experienced in the past. He notes  that I think needs 1 week to toughen up before we cut him loose completely. 07-16-2023 upon evaluation today patient appears to be doing well currently in regard to his wounds. In fact everything is completely healed and looks to be doing great. Fortunately I do not see any signs of active infection at this time which is great news. No fevers, chills, nausea, vomiting, or diarrhea. Electronic Signature(s) Signed: 07/16/2023 2:02:32 PM By: Allen Derry PA-C Entered By: Allen Derry on 07/16/2023  14:02:32 -------------------------------------------------------------------------------- Physical Exam Details Patient Name: Date of Service: [name], [name] 07/16/2023 1:30 PM Medical Record Number: [id number] Patient Account Number: [id number] Date of Birth/Sex: Treating RN: [birth year] (77 y.o. [name] Primary Care Provider: [name] Other Clinician: Referring Provider: Treating Provider/Extender: [name] in Treatment: 8 Constitutional Well-nourished and well-hydrated in no acute distress. Respiratory normal breathing without difficulty. Psychiatric this patient is able to make decisions and demonstrates good insight into disease process. Alert and Oriented x 3. pleasant and cooperative. Notes Upon inspection patient's wound bed showed signs of excellent epithelization no open wounds and in general he seems to really be doing quite well. I do not see any signs of infection I think he is ready to get into his compression socks. [name], [name] ([id number]) [id number] Page 5 of 11 Electronic Signature(s) Signed: 07/16/2023 2:02:47 PM By: Allen Derry PA-C Entered By: Allen Derry on 07/16/2023 14:02:47 -------------------------------------------------------------------------------- Physician Orders Details Patient Name: Date of Service: [name], [name] 07/16/2023 1:30 PM Medical Record Number: [id number] Patient Account Number: [id number] Date of Birth/Sex: Treating RN: Jan 07, 1946 (77 y.o. 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 Edema Control - Lymphedema / Segmental Compressive Device / Other Patient to wear own compression stockings. Remove compression stockings every night before going to bed and put on every morning when getting up. Elevate, Exercise Daily and [name] void Standing for Long Periods of Time. Elevate legs to the level of the heart and pump ankles as often as possible Elevate leg(s) parallel to the floor when sitting. Electronic Signature(s) Signed: 07/17/2023 11:22:48 AM By: Yevonne Pax RN Signed: 07/17/2023 4:58:41 PM By: Allen Derry PA-C Entered By: Yevonne Pax on 07/17/2023 11:22:48 -------------------------------------------------------------------------------- Problem List Details Patient Name: Date of Service: [name] [name], [name] 07/16/2023 1:30 PM Medical Record Number: [id number] Patient Account Number: [id number] Date of Birth/Sex: Treating RN: [birth year] (77 y.o. 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 Objective Constitutional Well-nourished and well-hydrated in no acute distress. Vitals Time Taken: 1:42 PM, Height: 75 in, Weight: 220 lbs, BMI: 27.5, Temperature: 97.8 F, Pulse: 69 bpm, Respiratory Rate: 18 breaths/min, Blood Pressure: 190/68 mmHg. Respiratory normal breathing without difficulty. Psychiatric this patient is able to make decisions and demonstrates good insight into disease process. Alert and Oriented x 3. pleasant and cooperative. General Notes: Upon inspection patient's wound bed showed signs of excellent epithelization no open wounds and in general he seems to  really be doing quite well. I do not see any signs of infection I think he is ready to get into his compression socks. Integumentary (Hair, Skin) Wound #21 status is Healed - Epithelialized. Original cause of wound was Gradually Appeared. The date acquired was: 04/08/2023. The wound has been in treatment 8 weeks. The wound is located on the Left,Lateral Lower Leg. The wound measures 0cm length x 0cm width x 0cm depth; 0cm^2 area and 0cm^3 volume. There is no tunneling or undermining noted. There is [name] none present amount of drainage noted. There is no granulation within the wound bed. There is no necrotic tissue within the wound bed. 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 I would recommend currently that we have the patient continue to monitor for any signs of infection or worsening. We will have [name] 2-week follow-up just to make sure that he maintains closure. 2. I am good recommend as well that the patient should continue to elevate his legs. He also should be wearing compression stockings. We will see him back for [name] follow-up visit in 2 weeks to see where things stand and hopefully he will remain closed. Electronic Signature(s) Signed: 07/16/2023 2:03:40 PM By: Allen Derry PA-C Entered By: Allen Derry on 07/16/2023 14:03:39 -------------------------------------------------------------------------------- SuperBill Details Patient Name: Date of Service: [name] [name], [name] 07/16/2023 Medical Record Number: [id number] Patient Account Number: [id number] Date of Birth/Sex: Treating  RN: 1945-12-22 (77 y.o. [name]) Yevonne Pax Primary Care Provider: [name] Other Clinician: Referring Provider: Treating Provider/Extender: [name] in Treatment: 8 Diagnosis Coding ICD-10 Codes Code Description I89.0 Lymphedema, not elsewhere classified 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 L97.822 Non-pressure chronic ulcer of other part of left lower leg with fat layer exposed I73.89 Other specified peripheral vascular diseases I48.0 Paroxysmal atrial fibrillation I10 Essential (primary) hypertension Facility Procedures : CPT4 Code: [id number] Description: 38756 - WOUND CARE VISIT-LEV 2 EST PT Modifier: Quantity: 1 Physician Procedures : CPT4 Code Description Modifier [id number] 99213 - WC PHYS LEVEL 3 - EST PT [name], [name] ([id number]) [id number] ICD-10 Diagnosis Description I89.0 Lymphedema, not elsewhere classified 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 07/16/2023 1:30 PM Medical Record Number: [id number] Patient Account Number: [id number] Date of Birth/Sex: Treating RN: 1946-04-19 (77 y.o. [name]) Yevonne Pax Primary Care Provider: [name] Other Clinician: Referring Provider: Treating Provider/Extender: [name] in Treatment: 8 Information Obtained from: Patient Chief Complaint Left LE Ulcer Electronic Signature(s) Signed: 07/16/2023 1:29:55 PM By: Allen Derry PA-C Entered By: Allen Derry on 07/16/2023 13:29:55 -------------------------------------------------------------------------------- HPI Details Patient Name: Date of Service: [name], [name] 07/16/2023 1:30 PM Medical Record Number: [id number] Patient Account Number: [id number] Date of Birth/Sex: Treating RN: 1946-05-28 (77 y.o. 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 the patient has had [name] vascular workup over 7 years ago and said everything was normal at that stage. He does not have any chronic problems except for cardiac issues which he sees [name] cardiologist in [place]. 08/15/2017 -- arterial and venous duplex studies still pending. 08/23/2017 -- venous reflux studies done on 08/13/2017 shows  venous incompetence throughout the left lower extremity deep system and focally at the left saphenofemoral junction. 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 No evidence of SVT or DVT in bilateral lower extremities The patient has an appointment at the end of the month to get his arterial duplex study done 09/05/2017 -- the patient was seen at the vein and vascular office yesterday by Bary Castilla. ABI studies were notable for medial calcification and the toe brachial indices were normal and bilateral ankle-brachial) waveforms were normal with triphasic flow. After review of his venous studies he was not [name] candidate for laser ablation and his lymphedema was to be treated with compression stockings and lymphedema pump pumps 09/12/2017 -- had [name] low arterial study done at the [facility] -- unable to obtain reliable ABI is due to medial calcification. Bilateral toe [name], [name] [id number] Page 2 of 11 indices were normal with the right being 1.01 and the left being 0.92 and the waveforms were triphasic bilaterally. he did get hold of 30-40 mm compression stockings but is unable to put these on. We will try and get him alternative compression stockings. 09/26/17- he is here in follow up evaluation of [name] right lower extremity ulcer;he is compliant in wearing compression stocking; ulcer almost epithelialized , anticipate healing next appointment Readmission: 11/17 point upon evaluation patient's wound currently that he is seeing [place] for today is [name] skin cancerous lesion that was cleared away by his dermatologist on the left medial calf region. He tells me that this is [name] very similar thing to what he had done previously in fact the last time he saw him in 2018 this was also what was going on at that point. Nonetheless he feels that based on what he seeing currently that this is just having [name] lot of harder time healing  although it is much closer to the surface than what he is experienced in the past. He notes  that I think needs 1 week to toughen up before we cut him loose completely. 07-16-2023 upon evaluation today patient appears to be doing well currently in regard to his wounds. In fact everything is completely healed and looks to be doing great. Fortunately I do not see any signs of active infection at this time which is great news. No fevers, chills, nausea, vomiting, or diarrhea. Electronic Signature(s) Signed: 07/16/2023 2:02:32 PM By: Allen Derry PA-C Entered By: Allen Derry on 07/16/2023  14:02:32 -------------------------------------------------------------------------------- Physical Exam Details Patient Name: Date of Service: [name], [name] 07/16/2023 1:30 PM Medical Record Number: [id number] Patient Account Number: [id number] Date of Birth/Sex: Treating RN: [birth year] (77 y.o. [name] Primary Care Provider: [name] Other Clinician: Referring Provider: Treating Provider/Extender: [name] in Treatment: 8 Constitutional Well-nourished and well-hydrated in no acute distress. Respiratory normal breathing without difficulty. Psychiatric this patient is able to make decisions and demonstrates good insight into disease process. Alert and Oriented x 3. pleasant and cooperative. Notes Upon inspection patient's wound bed showed signs of excellent epithelization no open wounds and in general he seems to really be doing quite well. I do not see any signs of infection I think he is ready to get into his compression socks. [name], [name] ([id number]) [id number] Page 5 of 11 Electronic Signature(s) Signed: 07/16/2023 2:02:47 PM By: Allen Derry PA-C Entered By: Allen Derry on 07/16/2023 14:02:47 -------------------------------------------------------------------------------- Physician Orders Details Patient Name: Date of Service: [name], [name] 07/16/2023 1:30 PM Medical Record Number: [id number] Patient Account Number: [id number] Date of Birth/Sex: Treating RN: Jan 07, 1946 (77 y.o. 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 Edema Control - Lymphedema / Segmental Compressive Device / Other Patient to wear own compression stockings. Remove compression stockings every night before going to bed and put on every morning when getting up. Elevate, Exercise Daily and [name] void Standing for Long Periods of Time. Elevate legs to the level of the heart and pump ankles as often as possible Elevate leg(s) parallel to the floor when sitting. Electronic Signature(s) Signed: 07/17/2023 11:22:48 AM By: Yevonne Pax RN Signed: 07/17/2023 4:58:41 PM By: Allen Derry PA-C Entered By: Yevonne Pax on 07/17/2023 11:22:48 -------------------------------------------------------------------------------- Problem List Details Patient Name: Date of Service: [name] [name], [name] 07/16/2023 1:30 PM Medical Record Number: [id number] Patient Account Number: [id number] Date of Birth/Sex: Treating RN: [birth year] (77 y.o. [name] Primary Care Provider: [name] Other Clinician: ANSON, [name] ([id number]) [id number] Page 6 of 11 Referring Provider: Treating Provider/Extender: [name] in Treatment: 8 Active Problems ICD-10 Encounter Code Description Active Date MDM Diagnosis I89.0 Lymphedema, not elsewhere classified 05/16/2023 No Yes I87.333 Chronic venous hypertension (idiopathic) with ulcer and inflammation of 05/16/2023 No Yes bilateral lower extremity L97.822 Non-pressure chronic ulcer of other part of left lower leg with fat layer exposed8/05/2023 No Yes I73.89 Other specified peripheral vascular diseases 05/16/2023 No Yes I48.0 Paroxysmal atrial fibrillation 05/16/2023 No Yes I10 Essential (primary) hypertension 05/16/2023 No  Yes Inactive  Problems Resolved Problems Electronic Signature(s) Signed: 07/16/2023 1:29:52 PM By: Allen Derry PA-C Entered By: Allen Derry on 07/16/2023 13:29:52 -------------------------------------------------------------------------------- Progress Note Details Patient Name: Date of Service: [name], [name] 07/16/2023 1:30 PM Medical Record Number: [id number] Patient Account Number: [id number] Date of Birth/Sex: Treating RN: Feb 14, 1946 (77 y.o. [name]) Yevonne Pax Primary Care Provider: [name] Other Clinician: Referring Provider: Treating Provider/Extender: [name] in Treatment: 8 Subjective Chief Complaint Information obtained from Patient Left LE Ulcer History of Present Illness (HPI) 77 year old male who has [name] past medical history of essential hypertension, chronic atrial fibrillation, peripheral vascular disease, nonischemic cardiomyopathy,venous stasis dermatitis, gouty arthropathy, basal cell carcinoma of the right lower extremity, benign prostatic hypertrophy, long-term use of [name], [name] ([id number]) [id number] Page 7 of 11 anticoagulation therapy, hyperglycemia and exercise intolerance has never been [name] smoker. the patient has had [name] vascular workup over 7 years ago and said everything was normal at that stage. He does not have any chronic problems except for cardiac issues which he sees [name] cardiologist in [place]. 08/15/2017 -- arterial and venous duplex studies still pending. 08/23/2017 -- venous reflux studies done on 08/13/2017 shows venous incompetence throughout the left lower extremity deep system and focally at the left saphenofemoral junction. 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 No evidence of SVT or DVT in bilateral lower extremities The patient has an appointment at the end of the month to get his arterial duplex study done 09/05/2017 -- the patient was seen at the vein and vascular  office yesterday by Bary Castilla. ABI studies were notable for medial calcification and the toe brachial indices were normal and bilateral ankle-brachial) waveforms were normal with triphasic flow. After review of his venous studies he was not [name] candidate for laser ablation and his lymphedema was to be treated with compression stockings and lymphedema pump pumps 09/12/2017 -- had [name] low arterial study done at the [facility] -- unable to obtain reliable ABI is due to medial calcification. Bilateral toe indices were normal with the right being 1.01 and the left being 0.92 and the waveforms were triphasic bilaterally. he did get hold of 30-40 mm compression stockings but is unable to put these on. We will try and get him alternative compression stockings. 09/26/17- he is here in follow up evaluation of [name] right lower extremity ulcer;he is compliant in wearing compression stocking; ulcer almost epithelialized , anticipate healing next appointment Readmission: 11/17 point upon evaluation patient's wound currently that he is seeing [place] for today is [name] skin cancerous lesion that was cleared away by his dermatologist on the left medial calf region. He tells me that this is [name] very similar thing to what he had done previously in fact the last time he saw him in 2018 this was also what was going on at that point. Nonetheless he feels that based on what he seeing currently that this is just having [name] lot of harder time healing although it is much closer to the surface than what he is experienced in the past. He notes that the initial removal was in June 2022 which was this year this is now November and still has not closed. He does have some edema and definitely I think that there is some venous component to his slow healing here. Also think that we can do something better than Vaseline to try to help with getting this to clear up as quickly as  [name], [name] ([id number]) [id number] Page 1 of 11 Visit Report for 07/16/2023 Chief Complaint Document Details Patient Name: Date of Service: [name] [name] 07/16/2023 1:30 PM Medical Record Number: [id number] Patient Account Number: [id number] Date of Birth/Sex: Treating RN: 1946-04-19 (77 y.o. [name]) Yevonne Pax Primary Care Provider: [name] Other Clinician: Referring Provider: Treating Provider/Extender: [name] in Treatment: 8 Information Obtained from: Patient Chief Complaint Left LE Ulcer Electronic Signature(s) Signed: 07/16/2023 1:29:55 PM By: Allen Derry PA-C Entered By: Allen Derry on 07/16/2023 13:29:55 -------------------------------------------------------------------------------- HPI Details Patient Name: Date of Service: [name], [name] 07/16/2023 1:30 PM Medical Record Number: [id number] Patient Account Number: [id number] Date of Birth/Sex: Treating RN: 1946-05-28 (77 y.o. 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 the patient has had [name] vascular workup over 7 years ago and said everything was normal at that stage. He does not have any chronic problems except for cardiac issues which he sees [name] cardiologist in [place]. 08/15/2017 -- arterial and venous duplex studies still pending. 08/23/2017 -- venous reflux studies done on 08/13/2017 shows  venous incompetence throughout the left lower extremity deep system and focally at the left saphenofemoral junction. 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 No evidence of SVT or DVT in bilateral lower extremities The patient has an appointment at the end of the month to get his arterial duplex study done 09/05/2017 -- the patient was seen at the vein and vascular office yesterday by Bary Castilla. ABI studies were notable for medial calcification and the toe brachial indices were normal and bilateral ankle-brachial) waveforms were normal with triphasic flow. After review of his venous studies he was not [name] candidate for laser ablation and his lymphedema was to be treated with compression stockings and lymphedema pump pumps 09/12/2017 -- had [name] low arterial study done at the [facility] -- unable to obtain reliable ABI is due to medial calcification. Bilateral toe [name], [name] [id number] Page 2 of 11 indices were normal with the right being 1.01 and the left being 0.92 and the waveforms were triphasic bilaterally. he did get hold of 30-40 mm compression stockings but is unable to put these on. We will try and get him alternative compression stockings. 09/26/17- he is here in follow up evaluation of [name] right lower extremity ulcer;he is compliant in wearing compression stocking; ulcer almost epithelialized , anticipate healing next appointment Readmission: 11/17 point upon evaluation patient's wound currently that he is seeing [place] for today is [name] skin cancerous lesion that was cleared away by his dermatologist on the left medial calf region. He tells me that this is [name] very similar thing to what he had done previously in fact the last time he saw him in 2018 this was also what was going on at that point. Nonetheless he feels that based on what he seeing currently that this is just having [name] lot of harder time healing  although it is much closer to the surface than what he is experienced in the past. He notes  [name], [name] ([id number]) [id number] Page 1 of 11 Visit Report for 07/16/2023 Chief Complaint Document Details Patient Name: Date of Service: [name] [name] 07/16/2023 1:30 PM Medical Record Number: [id number] Patient Account Number: [id number] Date of Birth/Sex: Treating RN: 1946-04-19 (77 y.o. [name]) Yevonne Pax Primary Care Provider: [name] Other Clinician: Referring Provider: Treating Provider/Extender: [name] in Treatment: 8 Information Obtained from: Patient Chief Complaint Left LE Ulcer Electronic Signature(s) Signed: 07/16/2023 1:29:55 PM By: Allen Derry PA-C Entered By: Allen Derry on 07/16/2023 13:29:55 -------------------------------------------------------------------------------- HPI Details Patient Name: Date of Service: [name], [name] 07/16/2023 1:30 PM Medical Record Number: [id number] Patient Account Number: [id number] Date of Birth/Sex: Treating RN: 1946-05-28 (77 y.o. 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 the patient has had [name] vascular workup over 7 years ago and said everything was normal at that stage. He does not have any chronic problems except for cardiac issues which he sees [name] cardiologist in [place]. 08/15/2017 -- arterial and venous duplex studies still pending. 08/23/2017 -- venous reflux studies done on 08/13/2017 shows  venous incompetence throughout the left lower extremity deep system and focally at the left saphenofemoral junction. 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 No evidence of SVT or DVT in bilateral lower extremities The patient has an appointment at the end of the month to get his arterial duplex study done 09/05/2017 -- the patient was seen at the vein and vascular office yesterday by Bary Castilla. ABI studies were notable for medial calcification and the toe brachial indices were normal and bilateral ankle-brachial) waveforms were normal with triphasic flow. After review of his venous studies he was not [name] candidate for laser ablation and his lymphedema was to be treated with compression stockings and lymphedema pump pumps 09/12/2017 -- had [name] low arterial study done at the [facility] -- unable to obtain reliable ABI is due to medial calcification. Bilateral toe [name], [name] [id number] Page 2 of 11 indices were normal with the right being 1.01 and the left being 0.92 and the waveforms were triphasic bilaterally. he did get hold of 30-40 mm compression stockings but is unable to put these on. We will try and get him alternative compression stockings. 09/26/17- he is here in follow up evaluation of [name] right lower extremity ulcer;he is compliant in wearing compression stocking; ulcer almost epithelialized , anticipate healing next appointment Readmission: 11/17 point upon evaluation patient's wound currently that he is seeing [place] for today is [name] skin cancerous lesion that was cleared away by his dermatologist on the left medial calf region. He tells me that this is [name] very similar thing to what he had done previously in fact the last time he saw him in 2018 this was also what was going on at that point. Nonetheless he feels that based on what he seeing currently that this is just having [name] lot of harder time healing  although it is much closer to the surface than what he is experienced in the past. He notes  nausea, vomiting, or diarrhea. Objective Constitutional Well-nourished and well-hydrated in no acute distress. Vitals Time Taken: 1:42 PM, Height: 75 in, Weight: 220 lbs, BMI: 27.5, Temperature: 97.8 F, Pulse: 69 bpm, Respiratory Rate: 18 breaths/min, Blood Pressure: 190/68 mmHg. Respiratory normal breathing without difficulty. Psychiatric this patient is able to make decisions and demonstrates good insight into disease process. Alert and Oriented x 3. pleasant and cooperative. General Notes: Upon inspection patient's wound bed showed signs of excellent epithelization no open wounds and in general he seems to  really be doing quite well. I do not see any signs of infection I think he is ready to get into his compression socks. Integumentary (Hair, Skin) Wound #21 status is Healed - Epithelialized. Original cause of wound was Gradually Appeared. The date acquired was: 04/08/2023. The wound has been in treatment 8 weeks. The wound is located on the Left,Lateral Lower Leg. The wound measures 0cm length x 0cm width x 0cm depth; 0cm^2 area and 0cm^3 volume. There is no tunneling or undermining noted. There is [name] none present amount of drainage noted. There is no granulation within the wound bed. There is no necrotic tissue within the wound bed. 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 I would recommend currently that we have the patient continue to monitor for any signs of infection or worsening. We will have [name] 2-week follow-up just to make sure that he maintains closure. 2. I am good recommend as well that the patient should continue to elevate his legs. He also should be wearing compression stockings. We will see him back for [name] follow-up visit in 2 weeks to see where things stand and hopefully he will remain closed. Electronic Signature(s) Signed: 07/16/2023 2:03:40 PM By: Allen Derry PA-C Entered By: Allen Derry on 07/16/2023 14:03:39 -------------------------------------------------------------------------------- SuperBill Details Patient Name: Date of Service: [name] [name], [name] 07/16/2023 Medical Record Number: [id number] Patient Account Number: [id number] Date of Birth/Sex: Treating  RN: 1945-12-22 (77 y.o. [name]) Yevonne Pax Primary Care Provider: [name] Other Clinician: Referring Provider: Treating Provider/Extender: [name] in Treatment: 8 Diagnosis Coding ICD-10 Codes Code Description I89.0 Lymphedema, not elsewhere classified 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 L97.822 Non-pressure chronic ulcer of other part of left lower leg with fat layer exposed I73.89 Other specified peripheral vascular diseases I48.0 Paroxysmal atrial fibrillation I10 Essential (primary) hypertension Facility Procedures : CPT4 Code: [id number] Description: 38756 - WOUND CARE VISIT-LEV 2 EST PT Modifier: Quantity: 1 Physician Procedures : CPT4 Code Description Modifier [id number] - WC PHYS LEVEL 3 - EST PT [name], [name] ([id number]) [id number] ICD-10 Diagnosis Description I89.0 Lymphedema, not elsewhere classified I87.333 Chronic venous hypertension  (idiopathic) with ulcer and inflammation of bilateral lower extremity L97.822 Non-pressure chronic ulcer of other part of left lower leg with fat layer exposed I73.89 Other specified peripheral vascular diseases Quantity: 1 df Page 11 of 11 Electronic Signature(s) Signed: 07/17/2023 11:52:15 AM By: Yevonne Pax RN Signed: 07/17/2023 4:58:41 PM By: Allen Derry PA-C Previous Signature: 07/16/2023 2:04:36 PM Version By: Allen Derry PA-C Entered By: Yevonne Pax on 07/17/2023 11:52:15  that I think needs 1 week to toughen up before we cut him loose completely. 07-16-2023 upon evaluation today patient appears to be doing well currently in regard to his wounds. In fact everything is completely healed and looks to be doing great. Fortunately I do not see any signs of active infection at this time which is great news. No fevers, chills, nausea, vomiting, or diarrhea. Electronic Signature(s) Signed: 07/16/2023 2:02:32 PM By: Allen Derry PA-C Entered By: Allen Derry on 07/16/2023  14:02:32 -------------------------------------------------------------------------------- Physical Exam Details Patient Name: Date of Service: [name], [name] 07/16/2023 1:30 PM Medical Record Number: [id number] Patient Account Number: [id number] Date of Birth/Sex: Treating RN: [birth year] (77 y.o. [name] Primary Care Provider: [name] Other Clinician: Referring Provider: Treating Provider/Extender: [name] in Treatment: 8 Constitutional Well-nourished and well-hydrated in no acute distress. Respiratory normal breathing without difficulty. Psychiatric this patient is able to make decisions and demonstrates good insight into disease process. Alert and Oriented x 3. pleasant and cooperative. Notes Upon inspection patient's wound bed showed signs of excellent epithelization no open wounds and in general he seems to really be doing quite well. I do not see any signs of infection I think he is ready to get into his compression socks. [name], [name] ([id number]) [id number] Page 5 of 11 Electronic Signature(s) Signed: 07/16/2023 2:02:47 PM By: Allen Derry PA-C Entered By: Allen Derry on 07/16/2023 14:02:47 -------------------------------------------------------------------------------- Physician Orders Details Patient Name: Date of Service: [name], [name] 07/16/2023 1:30 PM Medical Record Number: [id number] Patient Account Number: [id number] Date of Birth/Sex: Treating RN: Jan 07, 1946 (77 y.o. 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 Edema Control - Lymphedema / Segmental Compressive Device / Other Patient to wear own compression stockings. Remove compression stockings every night before going to bed and put on every morning when getting up. Elevate, Exercise Daily and [name] void Standing for Long Periods of Time. Elevate legs to the level of the heart and pump ankles as often as possible Elevate leg(s) parallel to the floor when sitting. Electronic Signature(s) Signed: 07/17/2023 11:22:48 AM By: Yevonne Pax RN Signed: 07/17/2023 4:58:41 PM By: Allen Derry PA-C Entered By: Yevonne Pax on 07/17/2023 11:22:48 -------------------------------------------------------------------------------- Problem List Details Patient Name: Date of Service: [name] [name], [name] 07/16/2023 1:30 PM Medical Record Number: [id number] Patient Account Number: [id number] Date of Birth/Sex: Treating RN: [birth year] (77 y.o. 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

## 2023-07-16 NOTE — Progress Notes (Addendum)
08/05/2023 8:01:26 AM By: Yevonne Pax RN Entered By: Yevonne Pax on 07/17/2023 08:53:44 -------------------------------------------------------------------------------- Wound Assessment Details Patient Name: Date of Service: [name], [name] 07/16/2023 1:30 PM Medical Record Number: [id number] Patient Account Number: [id number] Date of Birth/Sex: Treating RN: 1946/07/03 (77 y.o. [name] Petit) Yevonne Pax Primary Care [name]: [name] Other Clinician: Referring [name]: Treating [name]/Extender: [name]  in Treatment: 8 Wound Status Wound Number: 21 Primary Etiology: Vasculitis Wound Location: Left, Lateral Lower Leg Wound Status: Healed - Epithelialized Wounding Event: Gradually Appeared Comorbid History: Arrhythmia, Hypertension, Gout Date Acquired: 04/08/2023 [name] ([id number]) [id number] Page 8 of 9 Weeks Of Treatment: 8 Clustered Wound: No Photos Wound Measurements Length: (cm) Width: (cm) Depth: (cm) Area: (cm) Volume: (cm) 0 % Reduction in Area: 100% 0 % Reduction in Volume: 100% 0 Epithelialization: Large (67-100%) 0 Tunneling: No 0 Undermining: No Wound Description Classification: Full Thickness Without Exposed Support Exudate Amount: None Present Structures Foul Odor After Cleansing: No Slough/Fibrino No Wound Bed Granulation Amount: None Present (0%) Exposed Structure Necrotic Amount: None Present (0%) Fascia Exposed: No Fat Layer (Subcutaneous Tissue) Exposed: No Tendon Exposed: No Muscle Exposed: No Joint Exposed: No Bone Exposed: No Electronic Signature(s) Signed: 08/05/2023 8:01:26 AM By: Yevonne Pax RN Entered By: Yevonne Pax on 07/16/2023 10:53:50 -------------------------------------------------------------------------------- Vitals Details Patient Name: Date of Service: [name], [name] 07/16/2023 1:30 PM Medical Record Number: [id number] Patient Account Number: [id number] Date of Birth/Sex: Treating RN: [birth year] (77 y.o. [name] Petit) Yevonne Pax Primary Care Chayse Zatarain: [name] Other Clinician: Referring Porshe Fleagle: Treating [name]/Extender: [name] in Treatment: 8 Vital Signs Time Taken: 13:42 Temperature (F): 97.8 Height (in): 75 Pulse (bpm): 69 Weight (lbs): 220 Respiratory Rate (breaths/min): 18 Body Mass Index (BMI): 27.5 Blood Pressure (mmHg): 190/68 Reference Range: 80 - 120 mg / dl [name] ([id number]) [id number].pdf Page 9 of  9 Electronic Signature(s) Signed: 08/05/2023 8:01:26 AM By: Yevonne Pax RN Entered By: Yevonne Pax on 07/16/2023 10:43:07  Index(BMI): 27.5 Temperature(F): 97.8 Respiratory Rate(breaths/min): [address] [name] ([id number]) [id number] Page 5 of 9 [21:Photos:] [[name]:[name]] Left, Lateral Lower Leg [name] [name] Wound Location: Gradually Appeared [name] [name] Wounding Event: Vasculitis [name] [name] Primary Etiology: Arrhythmia, Hypertension, Gout [name] [name] Comorbid History: 04/08/2023 [name] [name] Date Acquired: 8 [name] [name] Weeks of Treatment: Healed - Epithelialized [name] [name] Wound Status: No [name] [name] Wound Recurrence: 0x0x0 [name] [name] Measurements L x W x D (cm) 0 [name] [name] A (cm) : rea 0 [name] [name] Volume (cm) : 100.00% [name] [name] % Reduction in A rea: 100.00% [name] [name] % Reduction in Volume: Full Thickness Without Exposed [name] [name] Classification: Support Structures None Present [name] [name] Exudate Amount: None Present (0%) [name] [name] Granulation Amount: None Present (0%) [name] [name] Necrotic Amount: Fascia: No [name] [name] Exposed Structures: Fat Layer (Subcutaneous Tissue): No Tendon: No Muscle: No Joint: No Bone: No Large (67-100%) [name] [name] Epithelialization: Treatment Notes Electronic Signature(s) Signed: 07/17/2023 11:22:12 AM By: Yevonne Pax RN Entered By: Yevonne Pax on 07/17/2023 08:22:12 -------------------------------------------------------------------------------- Multi-Disciplinary Care Plan  Details Patient Name: Date of Service: [name], [name] 07/16/2023 1:30 PM Medical Record Number: [id number] Patient Account Number: [id number] Date of Birth/Sex: Treating RN: 1945/10/20 (77 y.o. [name] Primary Care [name]: [name] Other Clinician: Referring [name]: Treating [name]/Extender: [name] in Treatment: 8 Active Inactive Wound/Skin Impairment Nursing Diagnoses: Knowledge deficit related to ulceration/compromised skin integrity Goals: [name] ([id number]) [id number] Page 6 of 9 Patient/caregiver will verbalize understanding of skin care regimen Date Initiated: 05/16/2023 Target Resolution Date: 07/16/2023 Goal Status: Active Ulcer/skin breakdown will have a volume reduction of 30% by week 4 Date Initiated: 05/16/2023 Date Inactivated: 06/20/2023 Target Resolution Date: 06/16/2023 Goal Status: Met Ulcer/skin breakdown will have a volume reduction of 50% by week 8 Date Initiated: 05/16/2023 Date Inactivated: 07/17/2023 Target Resolution Date: 07/16/2023 Goal Status: Met Ulcer/skin breakdown will have a volume reduction of 80% by week 12 Date Initiated: 05/16/2023 Date Inactivated: 07/17/2023 Target Resolution Date: 08/16/2023 Goal Status: Met Ulcer/skin breakdown will heal within 14 weeks Date Initiated: 05/16/2023 Date Inactivated: 07/17/2023 Target Resolution Date: 09/15/2023 Goal Status: Met Interventions: Assess patient/caregiver ability to obtain necessary supplies Assess patient/caregiver ability to perform ulcer/skin care regimen upon admission and as needed Assess ulceration(s) every visit Notes: Electronic Signature(s) Signed: 07/17/2023 11:52:30 AM By: Yevonne Pax RN Entered By: Yevonne Pax on 07/17/2023 08:52:30 -------------------------------------------------------------------------------- Pain Assessment Details Patient Name: Date of Service: [name] Beam [name] 07/16/2023 1:30  PM Medical Record Number: [id number] Patient Account Number: [id number] Date of Birth/Sex: Treating RN: Jun 15, 1946 (77 y.o. [name] Primary Care Eboni Coval: [name] Other Clinician: Referring Lateef Juncaj: Treating Nithila Sumners/Extender: [name] in Treatment: 8 Active Problems Location of Pain Severity and Description of Pain Patient Has Paino No Site Locations Pain Management and Medication Current Pain Management: PAYSON, LAUNER ([id number]) [id number].pdf Page 7 of 9 Electronic Signature(s) Signed: 08/05/2023 8:01:26 AM By: Yevonne Pax RN Entered By: Yevonne Pax on 07/16/2023 10:43:17 -------------------------------------------------------------------------------- Patient/Caregiver Education Details Patient Name: Date of Service: [name] 10/8/2024andnbsp1:30 PM Medical Record Number: [id number] Patient Account Number: [id number] Date of Birth/Gender: Treating RN: May 11, 1946 (77 y.o. [name] Petit) Yevonne Pax Primary Care Physician: [name] Other Clinician: Referring Physician: Treating Physician/Extender: [name] in Treatment: 8 Education Assessment Education Provided To: Patient Education Topics Provided Wound/Skin Impairment: Handouts: Caring for Your Ulcer Methods: Explain/Verbal Responses: State content correctly Electronic Signature(s) Signed:  Index(BMI): 27.5 Temperature(F): 97.8 Respiratory Rate(breaths/min): 30 [address] [name] ([id number]) [id number] Page 5 of 9 [21:Photos:] [[name]:[name]] Left, Lateral Lower Leg [name] [name] Wound Location: Gradually Appeared [name] [name] Wounding Event: Vasculitis [name] [name] Primary Etiology: Arrhythmia, Hypertension, Gout [name] [name] Comorbid History: 04/08/2023 [name] [name] Date Acquired: 8 [name] [name] Weeks of Treatment: Healed - Epithelialized [name] [name] Wound Status: No [name] [name] Wound Recurrence: 0x0x0 [name] [name] Measurements L x W x D (cm) 0 [name] [name] A (cm) : rea 0 [name] [name] Volume (cm) : 100.00% [name] [name] % Reduction in A rea: 100.00% [name] [name] % Reduction in Volume: Full Thickness Without Exposed [name] [name] Classification: Support Structures None Present [name] [name] Exudate Amount: None Present (0%) [name] [name] Granulation Amount: None Present (0%) [name] [name] Necrotic Amount: Fascia: No [name] [name] Exposed Structures: Fat Layer (Subcutaneous Tissue): No Tendon: No Muscle: No Joint: No Bone: No Large (67-100%) [name] [name] Epithelialization: Treatment Notes Electronic Signature(s) Signed: 07/17/2023 11:22:12 AM By: Yevonne Pax RN Entered By: Yevonne Pax on 07/17/2023 08:22:12 -------------------------------------------------------------------------------- Multi-Disciplinary Care Plan  Details Patient Name: Date of Service: [name], [name] 07/16/2023 1:30 PM Medical Record Number: [id number] Patient Account Number: [id number] Date of Birth/Sex: Treating RN: 1945/10/20 (77 y.o. [name] Primary Care [name]: [name] Other Clinician: Referring [name]: Treating [name]/Extender: [name] in Treatment: 8 Active Inactive Wound/Skin Impairment Nursing Diagnoses: Knowledge deficit related to ulceration/compromised skin integrity Goals: [name] ([id number]) [id number].pdf Page 6 of 9 Patient/caregiver will verbalize understanding of skin care regimen Date Initiated: 05/16/2023 Target Resolution Date: 07/16/2023 Goal Status: Active Ulcer/skin breakdown will have a volume reduction of 30% by week 4 Date Initiated: 05/16/2023 Date Inactivated: 06/20/2023 Target Resolution Date: 06/16/2023 Goal Status: Met Ulcer/skin breakdown will have a volume reduction of 50% by week 8 Date Initiated: 05/16/2023 Date Inactivated: 07/17/2023 Target Resolution Date: 07/16/2023 Goal Status: Met Ulcer/skin breakdown will have a volume reduction of 80% by week 12 Date Initiated: 05/16/2023 Date Inactivated: 07/17/2023 Target Resolution Date: 08/16/2023 Goal Status: Met Ulcer/skin breakdown will heal within 14 weeks Date Initiated: 05/16/2023 Date Inactivated: 07/17/2023 Target Resolution Date: 09/15/2023 Goal Status: Met Interventions: Assess patient/caregiver ability to obtain necessary supplies Assess patient/caregiver ability to perform ulcer/skin care regimen upon admission and as needed Assess ulceration(s) every visit Notes: Electronic Signature(s) Signed: 07/17/2023 11:52:30 AM By: Yevonne Pax RN Entered By: Yevonne Pax on 07/17/2023 08:52:30 -------------------------------------------------------------------------------- Pain Assessment Details Patient Name: Date of Service: [name] Beam [name] 07/16/2023 1:30  PM Medical Record Number: [id number] Patient Account Number: [id number] Date of Birth/Sex: Treating RN: Jun 15, 1946 (77 y.o. [name] Primary Care Eboni Coval: [name] Other Clinician: Referring Lateef Juncaj: Treating Nithila Sumners/Extender: [name] in Treatment: 8 Active Problems Location of Pain Severity and Description of Pain Patient Has Paino No Site Locations Pain Management and Medication Current Pain Management: PAYSON, LAUNER ([id number]) [id number].pdf Page 7 of 9 Electronic Signature(s) Signed: 08/05/2023 8:01:26 AM By: Yevonne Pax RN Entered By: Yevonne Pax on 07/16/2023 10:43:17 -------------------------------------------------------------------------------- Patient/Caregiver Education Details Patient Name: Date of Service: [name] 10/8/2024andnbsp1:30 PM Medical Record Number: [id number] Patient Account Number: [id number] Date of Birth/Gender: Treating RN: May 11, 1946 (77 y.o. [name] Petit) Yevonne Pax Primary Care Physician: [name] Other Clinician: Referring Physician: Treating Physician/Extender: [name] in Treatment: 8 Education Assessment Education Provided To: Patient Education Topics Provided Wound/Skin Impairment: Handouts: Caring for Your Ulcer Methods: Explain/Verbal Responses: State content correctly Electronic Signature(s) Signed:  08/05/2023 8:01:26 AM By: Yevonne Pax RN Entered By: Yevonne Pax on 07/17/2023 08:53:44 -------------------------------------------------------------------------------- Wound Assessment Details Patient Name: Date of Service: [name], [name] 07/16/2023 1:30 PM Medical Record Number: [id number] Patient Account Number: [id number] Date of Birth/Sex: Treating RN: 1946/07/03 (77 y.o. [name] Petit) Yevonne Pax Primary Care [name]: [name] Other Clinician: Referring [name]: Treating [name]/Extender: [name]  in Treatment: 8 Wound Status Wound Number: 21 Primary Etiology: Vasculitis Wound Location: Left, Lateral Lower Leg Wound Status: Healed - Epithelialized Wounding Event: Gradually Appeared Comorbid History: Arrhythmia, Hypertension, Gout Date Acquired: 04/08/2023 [name] ([id number]) [id number].pdf Page 8 of 9 Weeks Of Treatment: 8 Clustered Wound: No Photos Wound Measurements Length: (cm) Width: (cm) Depth: (cm) Area: (cm) Volume: (cm) 0 % Reduction in Area: 100% 0 % Reduction in Volume: 100% 0 Epithelialization: Large (67-100%) 0 Tunneling: No 0 Undermining: No Wound Description Classification: Full Thickness Without Exposed Support Exudate Amount: None Present Structures Foul Odor After Cleansing: No Slough/Fibrino No Wound Bed Granulation Amount: None Present (0%) Exposed Structure Necrotic Amount: None Present (0%) Fascia Exposed: No Fat Layer (Subcutaneous Tissue) Exposed: No Tendon Exposed: No Muscle Exposed: No Joint Exposed: No Bone Exposed: No Electronic Signature(s) Signed: 08/05/2023 8:01:26 AM By: Yevonne Pax RN Entered By: Yevonne Pax on 07/16/2023 10:53:50 -------------------------------------------------------------------------------- Vitals Details Patient Name: Date of Service: [name], [name] 07/16/2023 1:30 PM Medical Record Number: [id number] Patient Account Number: [id number] Date of Birth/Sex: Treating RN: [birth year] (77 y.o. [name] Petit) Yevonne Pax Primary Care Chayse Zatarain: [name] Other Clinician: Referring Porshe Fleagle: Treating [name]/Extender: [name] in Treatment: 8 Vital Signs Time Taken: 13:42 Temperature (F): 97.8 Height (in): 75 Pulse (bpm): 69 Weight (lbs): 220 Respiratory Rate (breaths/min): 18 Body Mass Index (BMI): 27.5 Blood Pressure (mmHg): 190/68 Reference Range: 80 - 120 mg / dl [name] ([id number]) [id number].pdf Page 9 of  9 Electronic Signature(s) Signed: 08/05/2023 8:01:26 AM By: Yevonne Pax RN Entered By: Yevonne Pax on 07/16/2023 10:43:07

## 2023-07-23 DIAGNOSIS — C44629 Squamous cell carcinoma of skin of left upper limb, including shoulder: Secondary | ICD-10-CM | POA: Diagnosis not present

## 2023-07-26 ENCOUNTER — Other Ambulatory Visit: Payer: Self-pay | Admitting: Physician Assistant

## 2023-07-30 ENCOUNTER — Encounter: Payer: Medicare PPO | Admitting: Physician Assistant

## 2023-07-30 DIAGNOSIS — L958 Other vasculitis limited to the skin: Secondary | ICD-10-CM | POA: Diagnosis not present

## 2023-07-30 DIAGNOSIS — L97822 Non-pressure chronic ulcer of other part of left lower leg with fat layer exposed: Secondary | ICD-10-CM | POA: Diagnosis not present

## 2023-07-30 DIAGNOSIS — I7389 Other specified peripheral vascular diseases: Secondary | ICD-10-CM | POA: Diagnosis not present

## 2023-07-30 DIAGNOSIS — I87333 Chronic venous hypertension (idiopathic) with ulcer and inflammation of bilateral lower extremity: Secondary | ICD-10-CM | POA: Diagnosis not present

## 2023-07-30 DIAGNOSIS — M109 Gout, unspecified: Secondary | ICD-10-CM | POA: Diagnosis not present

## 2023-07-30 DIAGNOSIS — I890 Lymphedema, not elsewhere classified: Secondary | ICD-10-CM | POA: Diagnosis not present

## 2023-07-30 DIAGNOSIS — Z7901 Long term (current) use of anticoagulants: Secondary | ICD-10-CM | POA: Diagnosis not present

## 2023-07-30 DIAGNOSIS — I10 Essential (primary) hypertension: Secondary | ICD-10-CM | POA: Diagnosis not present

## 2023-07-30 DIAGNOSIS — I480 Paroxysmal atrial fibrillation: Secondary | ICD-10-CM | POA: Diagnosis not present

## 2023-07-30 NOTE — Progress Notes (Addendum)
Compression Therapy Details Patient Name: Date of Service: [name] 07/30/2023 10:15 [name] M Medical Record Number: [id number] Patient Account Number: [id number] Date of Birth/Sex: Treating RN: [birth year] (77 y.o. [name] Primary Care [name]: [name] Other Clinician: Referring [name]: Treating [name]/Extender: [name] in Treatment: 10 Compression Therapy Performed for Wound Assessment: Wound #21R Left,Lateral Lower Leg Performed By: Clinician [name] Aver, RN Compression Type[address] [name] ([id number]) [id number] Page 3 of 9 Post Procedure Diagnosis Same as Pre-procedure Electronic Signature(s) Signed: 08/06/2023 4:37:34 PM By: [name] Aver MSN RN CNS WTA Entered By: [name] on 08/06/2023 16:37:34 -------------------------------------------------------------------------------- Encounter Discharge Information Details Patient Name: Date of Service: [name], [name] 07/30/2023 10:15 [name] M Medical Record Number: [id number] Patient Account Number: [id number] Date of Birth/Sex: Treating RN: [birth year] (77 y.o. [name] Primary Care [name]: [name] Other Clinician: Referring [name]: Treating [name]/Extender: [name] in Treatment: 10 Encounter Discharge Information Items Discharge Condition: Stable Ambulatory Status: Ambulatory Discharge Destination: Home Transportation: Private Auto Accompanied By: self Schedule Follow-up Appointment: Yes Clinical Summary of Care: Electronic Signature(s) Signed: 08/06/2023 4:39:54 PM By: [name] Aver MSN RN CNS WTA Entered By: [name] on 08/06/2023  16:39:54 -------------------------------------------------------------------------------- Lower Extremity Assessment Details Patient Name: Date of Service: [name], [name] 07/30/2023 10:15 [name] M Medical Record Number: [id number] Patient Account Number: [id number] Date of Birth/Sex: Treating RN: [birth year] (77 y.o. [name] Primary Care Jenilee Franey: [name] Other Clinician: Referring [name]: Treating [name]/Extender: [name] in Treatment: 10 Edema Assessment Assessed: [name]: Yes] [name]: No] [Left: Edema] [Right: :] Calf Left: RightRASUL, [name] [name] ([id number]) [id number] Page 4 of 9 Point of Measurement: 37 cm From Medial Instep 41 cm Ankle Left: Right: Point of Measurement: 12 cm From Medial Instep 24 cm Vascular Assessment Pulses: Dorsalis Pedis Palpable: [Left:Yes] Extremity colors, hair growth, and conditions: Extremity Color: [Left:Hyperpigmented] Hair Growth on Extremity: [Left:Yes] Temperature of Extremity: [Left:Warm] Capillary Refill: [Left:< 3 seconds] Dependent Rubor: [Left:No] Blanched when Elevated: [Left:No No] Toe Nail Assessment Left: Right: Thick: No Discolored: No Deformed: No Improper Length and Hygiene: No Electronic Signature(s) Signed: 08/06/2023 4:35:17 PM By: [name] Aver MSN RN CNS WTA Previous Signature: 07/30/2023 4:12:41 PM Version By: [name] Aver MSN RN CNS WTA Entered By: [name] on 08/06/2023 16:35:17 -------------------------------------------------------------------------------- Multi Wound Chart Details Patient Name: Date of Service: [name], [name] 07/30/2023 10:15 [name] M Medical Record Number: [id number] Patient Account Number: [id number] Date of Birth/Sex: Treating RN: 22-Oct-1945 (77 y.o. [name] Primary Care [name]: [name] Other Clinician: Referring [name]: Treating [name]/Extender: [name] in Treatment:  10 Vital Signs Height(in): 75 Pulse(bpm): 56 Weight(lbs): 220 Blood Pressure(mmHg): 149/60 Body Mass Index(BMI): 27.5 Temperature(F): 98.2 Respiratory Rate(breaths/min): 18 [21R:Photos:] [[name]:[name]] [name], [name] ([id number]) [21R:Left, Lateral Lower Leg Wound Location: Gradually Appeared Wounding Event: Vasculitis Primary Etiology: Arrhythmia, Hypertension, Gout Comorbid History: 04/08/2023 Date Acquired: 10 Weeks of Treatment: Open Wound Status: No Wound Recurrence: 3x2x0.1  Measurements L x W x D (cm) 4.712 [name] (cm) : rea 0.471 Volume (cm) : 65.70% % Reduction in [name] rea: 65.70% % Reduction in Volume: Full Thickness Without Exposed Classification: Support Structures None Present Exudate Amount: Large (67-100%) Granulation  Amount: None Present (0%) Necrotic Amount: Fascia: No Exposed Structures: Fat Layer (Subcutaneous Tissue): No Tendon: No Muscle: No Joint: No Bone: No Large (67-100%) Epithelialization:] [[name]:[name]  [name], [name] ([id number]) [id number] Page 1 of 9 Visit Report for 07/30/2023 Arrival Information Details Patient Name: Date of Service: [name] 07/30/2023 10:15 [name] M Medical Record Number: [id number] Patient Account Number: [id number] Date of Birth/Sex: Treating RN: May 12, 1946 (77 y.o. [name] Primary Care Tanessa Tidd: [name] Other Clinician: Referring Minette Manders: Treating [name]: [name] in Treatment: 10 Visit Information History Since Last Visit Added or deleted any medications: No Patient Arrived: Ambulatory Any new allergies or adverse reactions: No Arrival Time: 10:19 Has Dressing in Place as Prescribed: Yes Accompanied By: self Pain Present Now: No Transfer Assistance: None Patient Identification Verified: Yes Secondary Verification Process Completed: Yes Patient Requires Transmission-Based Precautions: No Patient Has Alerts: No Electronic Signature(s) Signed: 08/06/2023 4:34:48 PM By: [name] Aver MSN RN CNS WTA Previous Signature: 07/30/2023 4:12:41 PM Version By: [name] Aver MSN RN CNS WTA Entered By: [name] on 08/06/2023 16:34:48 -------------------------------------------------------------------------------- Clinic Level of Care Assessment Details Patient Name: Date of Service: [name] [facility] [name] 07/30/2023 10:15 [name] M Medical Record Number: [id number] Patient Account Number: [id number] Date of Birth/Sex: Treating RN: [birth year] (77 y.o. [name] Primary Care Yanai Hobson: [name] Other Clinician: Referring Rowyn Spilde: Treating Paulla Mcclaskey/Extender: [name] in Treatment: 10 Clinic Level of Care Assessment Items TOOL 1 Quantity Score []  - 0 Use when EandM and Procedure is performed on INITIAL visit ASSESSMENTS - Nursing Assessment / Reassessment []  - 0 General Physical Exam (combine w/ comprehensive assessment (listed just below) when  performed on new pt. evals) []  - 0 Comprehensive Assessment (HX, ROS, Risk Assessments, Wounds Hx, etc.) ASSESSMENTS - Wound and Skin Assessment / Reassessment []  - 0 Dermatologic / Skin Assessment (not related to wound area) ASSESSMENTS - Ostomy and/or Continence Assessment and Care [name], [name] ([id number]) [id number] Page 2 of 9 []  - 0 Incontinence Assessment and Management []  - 0 Ostomy Care Assessment and Management (repouching, etc.) PROCESS - Coordination of Care []  - 0 Simple Patient / Family Education for ongoing care []  - 0 Complex (extensive) Patient / Family Education for ongoing care []  - 0 Staff obtains Chiropractor, Records, T Results / Process Orders est []  - 0 Staff telephones HHA, Nursing Homes / Clarify orders / etc []  - 0 Routine Transfer to another Facility (non-emergent condition) []  - 0 Routine Hospital Admission (non-emergent condition) []  - 0 New Admissions / Manufacturing engineer / Ordering NPWT Apligraf, etc. , []  - 0 Emergency Hospital Admission (emergent condition) PROCESS - Special Needs []  - 0 Pediatric / Minor Patient Management []  - 0 Isolation Patient Management []  - 0 Hearing / Language / Visual special needs []  - 0 Assessment of Community assistance (transportation, [name], etc.) []  - 0 Additional assistance / Altered mentation []  - 0 Support Surface(s) Assessment (bed, cushion, seat, etc.) INTERVENTIONS - Miscellaneous []  - 0 External ear exam []  - 0 Patient Transfer (multiple staff / Nurse, adult / Similar devices) []  - 0 Simple Staple / Suture removal (25 or less) []  - 0 Complex Staple / Suture removal (26 or more) []  - 0 Hypo/Hyperglycemic Management (do not check if billed separately) []  - 0 Ankle / Brachial Index (ABI) - do not check if billed separately Has the patient been seen at the hospital within the last three years: Yes Total Score: 0 Level Of Care: ____ Electronic  Signature(s) Signed: 08/07/2023 5:05:25 PM By: [name] Aver MSN RN CNS WTA Entered By: [name] on 08/06/2023 16:38:09 --------------------------------------------------------------------------------  [name]  [name]] Treatment Notes Electronic Signature(s) Signed: 08/06/2023 4:35:26 PM By: [name] Aver MSN RN CNS WTA Previous Signature: 07/30/2023 4:12:41 PM Version By: [name] Aver MSN RN CNS WTA Entered By: [name] on 08/06/2023 16:35:25 -------------------------------------------------------------------------------- Multi-Disciplinary Care Plan Details Patient Name: Date of Service: [name], [name] 07/30/2023 10:15 [name] M Medical Record Number: [id number] Patient Account Number: [id number] Date of Birth/Sex: Treating RN: 1946/02/02 (77 y.o. [name] Primary Care Yuriko Portales: [name] Other Clinician: Referring Latavia Goga: Treating Rital Cavey/Extender: [name] in Treatment: 10 Active Inactive Wound/Skin Impairment Nursing Diagnoses: Knowledge deficit related to ulceration/compromised skin integrity Goals: Patient/caregiver will verbalize understanding of skin care  regimen Date Initiated: 05/16/2023 Target Resolution Date: 09/07/2023 Goal Status: Active Ulcer/skin breakdown will have [name] volume reduction of 30% by week 4 Date Initiated: 05/16/2023 Date Inactivated: 06/20/2023 Target Resolution Date: 06/16/2023 Goal Status: Met Ulcer/skin breakdown will have [name] volume reduction of 50% by week 8 Date Initiated: 05/16/2023 Date Inactivated: 07/17/2023 Target Resolution Date: 07/16/2023 Goal Status: Met Ulcer/skin breakdown will have [name] volume reduction of 80% by week 12 Date Initiated: 05/16/2023 Date Inactivated: 07/17/2023 Target Resolution Date: 08/16/2023 Goal Status: [name], [name] ([id number]) [id number] Page 6 of 9 Ulcer/skin breakdown will heal within 14 weeks Date Initiated: 05/16/2023 Date Inactivated: 07/17/2023 Target Resolution Date: 09/15/2023 Goal Status: Met Interventions: Assess patient/caregiver ability to obtain necessary supplies Assess patient/caregiver ability to perform ulcer/skin care regimen upon admission and as needed Assess ulceration(s) every visit Notes: Electronic Signature(s) Signed: 08/06/2023 4:38:46 PM By: [name] Aver MSN RN CNS WTA Entered By: [name] on 08/06/2023 16:38:46 -------------------------------------------------------------------------------- Pain Assessment Details Patient Name: Date of Service: [name], [name] 07/30/2023 10:15 [name] M Medical Record Number: [id number] Patient Account Number: [id number] Date of Birth/Sex: Treating RN: November 19, 1945 (77 y.o. [name] Primary Care Harol Shabazz: [name] Other Clinician: Referring Maleeyah Mccaughey: Treating Ahana Najera/Extender: [name] in Treatment: 10 Active Problems Location of Pain Severity and Description of Pain Patient Has Paino No Site Locations Pain Management and Medication Current Pain Management: Electronic Signature(s) Signed: 08/06/2023 4:34:59 PM By: [name] Aver MSN RN CNS  WTA Previous Signature: 07/30/2023 4:12:41 PM Version By: [name] Aver MSN RN CNS WTA Entered By: [name] on 08/06/2023 16:34:59 [name], [name] ([id number]) [id number] Page 7 of 9 -------------------------------------------------------------------------------- Patient/Caregiver Education Details Patient Name: Date of Service: [name] 10/22/2024andnbsp10:15 [name] M Medical Record Number: [id number] Patient Account Number: [id number] Date of Birth/Gender: Treating RN: March 12, 1946 (77 y.o. [name] Primary Care Physician: [name] Other Clinician: Referring Physician: Treating Physician/Extender: [name] in Treatment: 10 Education Assessment Education Provided To: Patient Education Topics Provided Wound/Skin Impairment: Handouts: Caring for Your Ulcer Methods: Explain/Verbal Responses: State content correctly Electronic Signature(s) Signed: 08/07/2023 5:05:25 PM By: [name] Aver MSN RN CNS WTA Entered By: [name] on 08/06/2023 16:38:58 -------------------------------------------------------------------------------- Wound Assessment Details Patient Name: Date of Service: [name], [name] 07/30/2023 10:15 [name] M Medical Record Number: [id number] Patient Account Number: [id number] Date of Birth/Sex: Treating RN: 1946-08-20 (77 y.o. [name] Primary Care Anayansi Rundquist: [name] Other Clinician: Referring Aubrynn Katona: Treating Tanessa Tidd/Extender: [name] in Treatment: 10 Wound Status Wound Number: 21R Primary Etiology: Vasculitis Wound Location: Left, Lateral Lower Leg Wound Status: Open Wounding Event: Gradually Appeared Comorbid History: Arrhythmia, Hypertension, Gout Date Acquired: 04/08/2023 Weeks Of  Treatment: 10 Clustered Wound: No Photos [name], [name] ([id number]) [id number] Page 8 of 9 Wound Measurements Length: (cm) 3 Width:  (cm) 2 Depth: (cm) 0.1 Area: (cm) 4.712 Volume: (cm) 0.471 % Reduction in Area: 65.7% % Reduction in Volume: 65.7% Epithelialization: Large (67-100%) Wound Description Classification: Full Thickness Without Exposed Suppor Exudate Amount: None Present t Structures Foul Odor After Cleansing: No Slough/Fibrino No Wound Bed Granulation Amount: Large (67-100%) Exposed Structure Granulation Quality: Hyper-granulation Fascia Exposed: No Necrotic Amount: None Present (0%) Fat Layer (Subcutaneous Tissue) Exposed: No Tendon Exposed: No Muscle Exposed: No Joint Exposed: No Bone Exposed: No Electronic Signature(s) Signed: 07/30/2023 4:12:41 PM By: [name] Aver MSN RN CNS WTA Entered By: [name] on 07/30/2023 10:29:52 -------------------------------------------------------------------------------- Vitals Details Patient Name: Date of Service: [name], [name] 07/30/2023 10:15 [name] M Medical Record Number: [id number] Patient Account Number: [id number] Date of Birth/Sex: Treating RN: 21-Feb-1946 (77 y.o. [name] Primary Care Denisse Whitenack: [name] Other Clinician: Referring Michalene Debruler: Treating Jossette Zirbel/Extender: [name] in Treatment: 10 Vital Signs Time Taken: 10:21 Temperature (F): 98.2 Height (in): 75 Pulse (bpm): 56 Weight (lbs): 220 Respiratory Rate (breaths/min): 18 Body Mass Index (BMI): 27.5 Blood Pressure (mmHg): 149/60 Reference Range: 80 - 120 mg / dl Electronic Signature(s) Signed: 08/06/2023 4:34:53 PM By: [name] Aver MSN RN CNS WTA Previous Signature: 07/30/2023 4:12:41 PM Version By: [name] Aver MSN RN CNS WTA Entered By: [name] on 08/06/2023 16:34:53 [name] ([id number]) [id number] Page 9 of 9

## 2023-07-30 NOTE — Progress Notes (Addendum)
Orders / Instructions UrgoK2 Wound Treatment Wound #21R - Lower Leg Wound Laterality: Left, Lateral Cleanser: Soap and Water 1 x Per Week/30 Days Discharge Instructions: Gently cleanse wound with antibacterial soap, rinse and pat dry prior to dressing wounds Cleanser: Vashe 5.8 (oz) 1 x Per Week/30 Days Discharge Instructions: Use vashe 5.8 (oz) as directed Prim Dressing: Hydrofera Blue Ready Transfer Foam, 2.5x2.5 (in/in) 1 x Per  Week/30 Days ary Discharge Instructions: Apply Hydrofera Blue Ready to wound bed as directed Secondary Dressing: Zetuvit Plus 4x4 (in/in) 1 x Per Week/30 Days Compression Wrap: Urgo K2, two layer compression system, regular 1 x Per Week/30 Days [name] ([id number]) [id number] Page 6 of 11 Electronic Signature(s) Signed: 08/07/2023 4:35:36 PM By: Allen Derry PA-C Signed: 08/07/2023 5:05:25 PM By: [name] MSN RN CNS WTA Previous Signature: 08/06/2023 4:37:04 PM Version By: [name] MSN RN CNS WTA Entered By: [name] on 08/06/2023 16:38:03 -------------------------------------------------------------------------------- Problem List Details Patient Name: Date of Service: [name], [name] 07/30/2023 10:15 [name] M Medical Record Number: [id number] Patient Account Number: [id number] Date of Birth/Sex: Treating RN: 11/03/1945 (77 y.o. 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/05/2023 No Yes I73.89 Other specified peripheral vascular diseases 05/16/2023 No Yes I48.0 Paroxysmal atrial fibrillation 05/16/2023 No Yes I10 Essential (primary) hypertension 05/16/2023 No Yes Inactive Problems Resolved Problems Electronic Signature(s) Signed: 07/30/2023 10:46:23 AM By: Allen Derry PA-C Entered By: Allen Derry on 07/30/2023 10:46:23 [name] ([id number]) [id number] Page 7 of  11 -------------------------------------------------------------------------------- Progress Note Details Patient Name: Date of Service: [name] 07/30/2023 10:15 [name] M Medical Record Number: [id number] Patient Account Number: [id number] Date of Birth/Sex: Treating RN: [birth year] (77 y.o. [name] Primary Care Provider: [name] Other Clinician: Referring Provider: Treating Provider/Extender: [name] in Treatment: 10 Subjective Chief Complaint Information obtained from Patient Left LE Ulcer History of Present Illness (HPI) 77 year old male who has [name] past medical history of essential hypertension, chronic atrial fibrillation, peripheral vascular disease, nonischemic cardiomyopathy,venous stasis dermatitis, gouty arthropathy, basal cell carcinoma of the right lower extremity, benign prostatic hypertrophy, long-term use of anticoagulation therapy, hyperglycemia and exercise intolerance has never been [name] smoker. the patient has had [name] vascular workup over 7 years ago and said everything was normal at that stage. He does not have any chronic problems except for cardiac issues which he sees [name] cardiologist in [place]. 08/15/2017 -- arterial and venous duplex studies still pending. 08/23/2017 -- venous reflux studies done on 08/13/2017 shows venous incompetence throughout the left lower extremity deep system and focally at the left saphenofemoral junction. 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 No evidence of SVT or DVT in bilateral lower extremities The patient has an appointment at the end of the month to get his arterial duplex study done 09/05/2017 -- the patient was seen at the vein and vascular office yesterday by Bary Castilla. ABI studies were notable for medial calcification and the toe brachial indices were normal and bilateral ankle-brachial) waveforms were normal with triphasic flow. After review of his venous  studies he was not  [name] [name] ([id number]) [id number] Page 1 of 11 Visit Report for 07/30/2023 Chief Complaint Document Details Patient Name: Date of Service: [name] 07/30/2023 10:15 [name] M Medical Record Number: [id number] Patient Account Number: [id number] Date of Birth/Sex: Treating RN: 18-Dec-1945 (77 y.o. [name] Primary Care Provider: [name] Other Clinician: Referring Provider: Treating Provider/Extender: [name] in Treatment: 10 Information Obtained from: Patient Chief Complaint Left LE Ulcer Electronic Signature(s) Signed: 07/30/2023 10:46:25 AM By: Allen Derry PA-C Entered By: Allen Derry on 07/30/2023 10:46:25 -------------------------------------------------------------------------------- HPI Details Patient Name: Date of Service: [name], [name] 07/30/2023 10:15 [name] M Medical Record Number: [id number] Patient Account Number: [id number] Date of Birth/Sex: Treating RN: 08-Aug-1946 (77 y.o. 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 the patient has had [name] vascular workup over 7 years ago and said everything was normal at that stage. He does not have any chronic problems except for cardiac issues which he sees [name] cardiologist in [place]. 08/15/2017 -- arterial and venous duplex studies still pending. 08/23/2017 -- venous reflux studies done on 08/13/2017  shows venous incompetence throughout the left lower extremity deep system and focally at the left saphenofemoral junction. 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 No evidence of SVT or DVT in bilateral lower extremities The patient has an appointment at the end of the month to get his arterial duplex study done 09/05/2017 -- the patient was seen at the vein and vascular office yesterday by Bary Castilla. ABI studies were notable for medial calcification and the toe brachial indices were normal and bilateral ankle-brachial) waveforms were normal with triphasic flow. After review of his venous studies he was not [name] candidate for laser ablation and his lymphedema was to be treated with compression stockings and lymphedema pump pumps 09/12/2017 -- had [name] low arterial study done at the [place] [facility] -- unable to obtain reliable ABI is due to medial calcification. Bilateral toe FREDYS, VLASIC ([id number]) [id number] Page 2 of 11 indices were normal with the right being 1.01 and the left being 0.92 and the waveforms were triphasic bilaterally. he did get hold of 30-40 mm compression stockings but is unable to put these on. We will try and get him alternative compression stockings. 09/26/17- he is here in follow up evaluation of [name] right lower extremity ulcer;he is compliant in wearing compression stocking; ulcer almost epithelialized , anticipate healing next appointment Readmission: 11/17 point upon evaluation patient's wound currently that he is seeing [place] for today is [name] skin cancerous lesion that was cleared away by his dermatologist on the left medial calf region. He tells me that this is [name] very similar thing to what he had done previously in fact the last time he saw him in 2018 this was also what was going on at that point. Nonetheless he feels that based on what he seeing currently that this is just having [name] lot of harder time  healing although it is much closer to the surface than what he is experienced in the past.  with [name] skin cancer. Integumentary (Hair, Skin) Wound #21R status is Open. Original cause of wound was Gradually Appeared. The date acquired was: 04/08/2023. The wound has been in treatment 10 weeks. The wound is located on the Left,Lateral Lower Leg. The wound measures 3cm length x 2cm width x 0.1cm depth; 4.712cm^2 area and 0.471cm^3 volume. There is [name] none present amount of drainage noted. There is large (67-100%) hyper - granulation within the wound bed. There is no necrotic tissue within the wound bed. 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 I recommend that we have the patient continue to monitor for any signs of infection or worsening. Based  on what I see right now we will going go and reinitiate [facility] which I think is probably best thing to do would also use the Urgo K2 compression wrap. 2. I am going to recommend that he continue to elevate his legs he should also continue with the compression wrap on the right leg. 3. I am also going to see about getting an appointment with Dr. Adolphus Birchwood as quickly as possible. Hopefully this will not take too long to get him in for biopsy. We will see patient back for reevaluation in 1 week here in the clinic. If anything worsens or changes patient will contact our office for additional recommendations. Electronic Signature(s) Signed: 07/30/2023 1:40:29 PM By: Allen Derry PA-C Entered By: Allen Derry on 07/30/2023 13:40:29 KASEAN, MATCZAK [name] ([id number]) [id number] Page 11 of 11 -------------------------------------------------------------------------------- SuperBill Details Patient Name: Date of Service: [name] 07/30/2023 Medical Record Number: [id number] Patient Account Number: [id number] Date of Birth/Sex: Treating RN: 11-Oct-1945 (77 y.o. [name] Primary Care Provider: [name] Other Clinician: Referring Provider: Treating Provider/Extender: [name] in Treatment: 10 Diagnosis Coding ICD-10 Codes Code Description I89.0 Lymphedema, not elsewhere classified 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 L97.822 Non-pressure chronic ulcer of other part of left lower leg with fat layer exposed I73.89 Other specified peripheral vascular diseases I48.0 Paroxysmal atrial fibrillation I10 Essential (primary) hypertension Facility Procedures : CPT4 Code: 56387564 Description: (Facility Use Only) 29581LT - APPLY MULTLAY COMPRS LWR LT LEG Modifier: Quantity: 1 Physician Procedures : CPT4 Code Description Modifier [id number] - WC PHYS LEVEL 3 -  EST PT ICD-10 Diagnosis Description I89.0 Lymphedema, not elsewhere classified 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  L97.822 Non-pressure chronic ulcer of other part of left lower leg with fat layer exposed I73.89 Other specified peripheral vascular diseases Quantity: 1 Electronic Signature(s) Signed: 08/06/2023 4:38:24 PM By: [name] MSN RN CNS WTA Signed: 08/07/2023 4:35:36 PM By: Allen Derry PA-C Previous Signature: 07/30/2023 1:40:49 PM Version By: Allen Derry PA-C Entered By: [name] on 08/06/2023 16:38:24  [name] [name] ([id number]) [id number] Page 1 of 11 Visit Report for 07/30/2023 Chief Complaint Document Details Patient Name: Date of Service: [name] 07/30/2023 10:15 [name] M Medical Record Number: [id number] Patient Account Number: [id number] Date of Birth/Sex: Treating RN: 18-Dec-1945 (77 y.o. [name] Primary Care Provider: [name] Other Clinician: Referring Provider: Treating Provider/Extender: [name] in Treatment: 10 Information Obtained from: Patient Chief Complaint Left LE Ulcer Electronic Signature(s) Signed: 07/30/2023 10:46:25 AM By: Allen Derry PA-C Entered By: Allen Derry on 07/30/2023 10:46:25 -------------------------------------------------------------------------------- HPI Details Patient Name: Date of Service: [name], [name] 07/30/2023 10:15 [name] M Medical Record Number: [id number] Patient Account Number: [id number] Date of Birth/Sex: Treating RN: 08-Aug-1946 (77 y.o. 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 the patient has had [name] vascular workup over 7 years ago and said everything was normal at that stage. He does not have any chronic problems except for cardiac issues which he sees [name] cardiologist in [place]. 08/15/2017 -- arterial and venous duplex studies still pending. 08/23/2017 -- venous reflux studies done on 08/13/2017  shows venous incompetence throughout the left lower extremity deep system and focally at the left saphenofemoral junction. 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 No evidence of SVT or DVT in bilateral lower extremities The patient has an appointment at the end of the month to get his arterial duplex study done 09/05/2017 -- the patient was seen at the vein and vascular office yesterday by Bary Castilla. ABI studies were notable for medial calcification and the toe brachial indices were normal and bilateral ankle-brachial) waveforms were normal with triphasic flow. After review of his venous studies he was not [name] candidate for laser ablation and his lymphedema was to be treated with compression stockings and lymphedema pump pumps 09/12/2017 -- had [name] low arterial study done at the [facility] -- unable to obtain reliable ABI is due to medial calcification. Bilateral toe FREDYS, VLASIC ([id number]) [id number] Page 2 of 11 indices were normal with the right being 1.01 and the left being 0.92 and the waveforms were triphasic bilaterally. he did get hold of 30-40 mm compression stockings but is unable to put these on. We will try and get him alternative compression stockings. 09/26/17- he is here in follow up evaluation of [name] right lower extremity ulcer;he is compliant in wearing compression stocking; ulcer almost epithelialized , anticipate healing next appointment Readmission: 11/17 point upon evaluation patient's wound currently that he is seeing [place] for today is [name] skin cancerous lesion that was cleared away by his dermatologist on the left medial calf region. He tells me that this is [name] very similar thing to what he had done previously in fact the last time he saw him in 2018 this was also what was going on at that point. Nonetheless he feels that based on what he seeing currently that this is just having [name] lot of harder time  healing although it is much closer to the surface than what he is experienced in the past.  tiny area that I think needs 1 week to toughen up before we cut him loose completely. 07-16-2023 upon evaluation today patient appears to be doing well currently in regard to his wounds. In fact everything is completely healed and looks to be doing great. Fortunately I do not see any signs of active infection at this time which is great news. No fevers, chills, nausea, vomiting, or diarrhea. 07-30-2023 upon evaluation today patient unfortunately has [name] breakdown of the wound on the lateral aspect of his leg. Unfortunately I am [name] little  concerned about the overall appearance of the leg I feel like he may require [name] biopsy to further evaluate the situation. I discussed that with the patient today. With that being said I previously been asked by Dr. Adolphus Birchwood not to perform any of the biopsies myself but to let them see it prior to the biopsy. For that reason I would go ahead and see about making referral to Dr. Durene Cal office to get this scheduled as quickly as possible is my hope. We reached out to them have not heard back as of yet. Electronic Signature(s) Signed: 07/30/2023 1:39:13 PM By: Allen Derry PA-C Entered By: Allen Derry on 07/30/2023 13:39:13 -------------------------------------------------------------------------------- Physical Exam Details Patient Name: Date of Service: [name], [name] 07/30/2023 10:15 [name] M Medical Record Number: [id number] Patient Account Number: [id number] Date of Birth/Sex: Treating RN: 31-Jan-1946 (77 y.o. [name] Primary Care Provider: [name] Other Clinician: Referring Provider: Treating Provider/Extender: [name] in Treatment: 10 Constitutional Well-nourished and well-hydrated in no acute distress. Respiratory normal breathing without difficulty. Psychiatric this patient is able to make decisions and demonstrates good insight into disease process. Alert and Oriented x 3. pleasant and cooperative. LINC, FLAMENCO [name] ([id number]) [id number] Page 5 of 11 Notes Upon inspection patient's wound bed actually showed signs of good granulation although it was [name] little bit of hypergranulation at this point. Nonetheless he does have me [name] little bit concerned here to be honest. I feel like that the appearance is just [name] little bit off and I thought so even when we were treating this before but we had held off on making the referral for biopsy due to the fact that it was healing so well before to reopen at this point with him  wearing his compression socks on [name] regular basis and the appearance overall of this I feel like that we may be dealing with [name] skin cancer. Electronic Signature(s) Signed: 07/30/2023 1:39:47 PM By: Allen Derry PA-C Entered By: Allen Derry on 07/30/2023 13:39:46 -------------------------------------------------------------------------------- Physician Orders Details Patient Name: Date of Service: [name], [name] 07/30/2023 10:15 [name] M Medical Record Number: [id number] Patient Account Number: [id number] Date of Birth/Sex: Treating RN: 1946-06-29 (77 y.o. 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 Bathing/ Shower/ Hygiene May shower with wound dressing protected with water repellent cover or cast protector. No tub bath. Edema Control -  with [name] skin cancer. Integumentary (Hair, Skin) Wound #21R status is Open. Original cause of wound was Gradually Appeared. The date acquired was: 04/08/2023. The wound has been in treatment 10 weeks. The wound is located on the Left,Lateral Lower Leg. The wound measures 3cm length x 2cm width x 0.1cm depth; 4.712cm^2 area and 0.471cm^3 volume. There is [name] none present amount of drainage noted. There is large (67-100%) hyper - granulation within the wound bed. There is no necrotic tissue within the wound bed. 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 I recommend that we have the patient continue to monitor for any signs of infection or worsening. Based  on what I see right now we will going go and reinitiate [facility] which I think is probably best thing to do would also use the Urgo K2 compression wrap. 2. I am going to recommend that he continue to elevate his legs he should also continue with the compression wrap on the right leg. 3. I am also going to see about getting an appointment with Dr. Adolphus Birchwood as quickly as possible. Hopefully this will not take too long to get him in for biopsy. We will see patient back for reevaluation in 1 week here in the clinic. If anything worsens or changes patient will contact our office for additional recommendations. Electronic Signature(s) Signed: 07/30/2023 1:40:29 PM By: Allen Derry PA-C Entered By: Allen Derry on 07/30/2023 13:40:29 KASEAN, MATCZAK [name] ([id number]) [id number] Page 11 of 11 -------------------------------------------------------------------------------- SuperBill Details Patient Name: Date of Service: [name] 07/30/2023 Medical Record Number: [id number] Patient Account Number: [id number] Date of Birth/Sex: Treating RN: 11-Oct-1945 (77 y.o. [name] Primary Care Provider: [name] Other Clinician: Referring Provider: Treating Provider/Extender: [name] in Treatment: 10 Diagnosis Coding ICD-10 Codes Code Description I89.0 Lymphedema, not elsewhere classified 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 L97.822 Non-pressure chronic ulcer of other part of left lower leg with fat layer exposed I73.89 Other specified peripheral vascular diseases I48.0 Paroxysmal atrial fibrillation I10 Essential (primary) hypertension Facility Procedures : CPT4 Code: 56387564 Description: (Facility Use Only) 29581LT - APPLY MULTLAY COMPRS LWR LT LEG Modifier: Quantity: 1 Physician Procedures : CPT4 Code Description Modifier [id number] - WC PHYS LEVEL 3 -  EST PT ICD-10 Diagnosis Description I89.0 Lymphedema, not elsewhere classified 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  L97.822 Non-pressure chronic ulcer of other part of left lower leg with fat layer exposed I73.89 Other specified peripheral vascular diseases Quantity: 1 Electronic Signature(s) Signed: 08/06/2023 4:38:24 PM By: [name] MSN RN CNS WTA Signed: 08/07/2023 4:35:36 PM By: Allen Derry PA-C Previous Signature: 07/30/2023 1:40:49 PM Version By: Allen Derry PA-C Entered By: [name] on 08/06/2023 16:38:24  [name] [name] ([id number]) [id number] Page 1 of 11 Visit Report for 07/30/2023 Chief Complaint Document Details Patient Name: Date of Service: [name] 07/30/2023 10:15 [name] M Medical Record Number: [id number] Patient Account Number: [id number] Date of Birth/Sex: Treating RN: 18-Dec-1945 (77 y.o. [name] Primary Care Provider: [name] Other Clinician: Referring Provider: Treating Provider/Extender: [name] in Treatment: 10 Information Obtained from: Patient Chief Complaint Left LE Ulcer Electronic Signature(s) Signed: 07/30/2023 10:46:25 AM By: Allen Derry PA-C Entered By: Allen Derry on 07/30/2023 10:46:25 -------------------------------------------------------------------------------- HPI Details Patient Name: Date of Service: [name], [name] 07/30/2023 10:15 [name] M Medical Record Number: [id number] Patient Account Number: [id number] Date of Birth/Sex: Treating RN: 08-Aug-1946 (77 y.o. 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 the patient has had [name] vascular workup over 7 years ago and said everything was normal at that stage. He does not have any chronic problems except for cardiac issues which he sees [name] cardiologist in [place]. 08/15/2017 -- arterial and venous duplex studies still pending. 08/23/2017 -- venous reflux studies done on 08/13/2017  shows venous incompetence throughout the left lower extremity deep system and focally at the left saphenofemoral junction. 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 No evidence of SVT or DVT in bilateral lower extremities The patient has an appointment at the end of the month to get his arterial duplex study done 09/05/2017 -- the patient was seen at the vein and vascular office yesterday by Bary Castilla. ABI studies were notable for medial calcification and the toe brachial indices were normal and bilateral ankle-brachial) waveforms were normal with triphasic flow. After review of his venous studies he was not [name] candidate for laser ablation and his lymphedema was to be treated with compression stockings and lymphedema pump pumps 09/12/2017 -- had [name] low arterial study done at the [facility] -- unable to obtain reliable ABI is due to medial calcification. Bilateral toe FREDYS, VLASIC ([id number]) [id number] Page 2 of 11 indices were normal with the right being 1.01 and the left being 0.92 and the waveforms were triphasic bilaterally. he did get hold of 30-40 mm compression stockings but is unable to put these on. We will try and get him alternative compression stockings. 09/26/17- he is here in follow up evaluation of [name] right lower extremity ulcer;he is compliant in wearing compression stocking; ulcer almost epithelialized , anticipate healing next appointment Readmission: 11/17 point upon evaluation patient's wound currently that he is seeing [place] for today is [name] skin cancerous lesion that was cleared away by his dermatologist on the left medial calf region. He tells me that this is [name] very similar thing to what he had done previously in fact the last time he saw him in 2018 this was also what was going on at that point. Nonetheless he feels that based on what he seeing currently that this is just having [name] lot of harder time  healing although it is much closer to the surface than what he is experienced in the past.  Orders / Instructions UrgoK2 Wound Treatment Wound #21R - Lower Leg Wound Laterality: Left, Lateral Cleanser: Soap and Water 1 x Per Week/30 Days Discharge Instructions: Gently cleanse wound with antibacterial soap, rinse and pat dry prior to dressing wounds Cleanser: Vashe 5.8 (oz) 1 x Per Week/30 Days Discharge Instructions: Use vashe 5.8 (oz) as directed Prim Dressing: Hydrofera Blue Ready Transfer Foam, 2.5x2.5 (in/in) 1 x Per  Week/30 Days ary Discharge Instructions: Apply Hydrofera Blue Ready to wound bed as directed Secondary Dressing: Zetuvit Plus 4x4 (in/in) 1 x Per Week/30 Days Compression Wrap: Urgo K2, two layer compression system, regular 1 x Per Week/30 Days [name] ([id number]) [id number] Page 6 of 11 Electronic Signature(s) Signed: 08/07/2023 4:35:36 PM By: Allen Derry PA-C Signed: 08/07/2023 5:05:25 PM By: [name] MSN RN CNS WTA Previous Signature: 08/06/2023 4:37:04 PM Version By: [name] MSN RN CNS WTA Entered By: [name] on 08/06/2023 16:38:03 -------------------------------------------------------------------------------- Problem List Details Patient Name: Date of Service: [name], [name] 07/30/2023 10:15 [name] M Medical Record Number: [id number] Patient Account Number: [id number] Date of Birth/Sex: Treating RN: 11/03/1945 (77 y.o. 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/05/2023 No Yes I73.89 Other specified peripheral vascular diseases 05/16/2023 No Yes I48.0 Paroxysmal atrial fibrillation 05/16/2023 No Yes I10 Essential (primary) hypertension 05/16/2023 No Yes Inactive Problems Resolved Problems Electronic Signature(s) Signed: 07/30/2023 10:46:23 AM By: Allen Derry PA-C Entered By: Allen Derry on 07/30/2023 10:46:23 [name] ([id number]) [id number] Page 7 of  11 -------------------------------------------------------------------------------- Progress Note Details Patient Name: Date of Service: [name] 07/30/2023 10:15 [name] M Medical Record Number: [id number] Patient Account Number: [id number] Date of Birth/Sex: Treating RN: [birth year] (77 y.o. [name] Primary Care Provider: [name] Other Clinician: Referring Provider: Treating Provider/Extender: [name] in Treatment: 10 Subjective Chief Complaint Information obtained from Patient Left LE Ulcer History of Present Illness (HPI) 77 year old male who has [name] past medical history of essential hypertension, chronic atrial fibrillation, peripheral vascular disease, nonischemic cardiomyopathy,venous stasis dermatitis, gouty arthropathy, basal cell carcinoma of the right lower extremity, benign prostatic hypertrophy, long-term use of anticoagulation therapy, hyperglycemia and exercise intolerance has never been [name] smoker. the patient has had [name] vascular workup over 7 years ago and said everything was normal at that stage. He does not have any chronic problems except for cardiac issues which he sees [name] cardiologist in [place]. 08/15/2017 -- arterial and venous duplex studies still pending. 08/23/2017 -- venous reflux studies done on 08/13/2017 shows venous incompetence throughout the left lower extremity deep system and focally at the left saphenofemoral junction. 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 No evidence of SVT or DVT in bilateral lower extremities The patient has an appointment at the end of the month to get his arterial duplex study done 09/05/2017 -- the patient was seen at the vein and vascular office yesterday by Bary Castilla. ABI studies were notable for medial calcification and the toe brachial indices were normal and bilateral ankle-brachial) waveforms were normal with triphasic flow. After review of his venous  studies he was not  [name] [name] ([id number]) [id number] Page 1 of 11 Visit Report for 07/30/2023 Chief Complaint Document Details Patient Name: Date of Service: [name] 07/30/2023 10:15 [name] M Medical Record Number: [id number] Patient Account Number: [id number] Date of Birth/Sex: Treating RN: 18-Dec-1945 (77 y.o. [name] Primary Care Provider: [name] Other Clinician: Referring Provider: Treating Provider/Extender: [name] in Treatment: 10 Information Obtained from: Patient Chief Complaint Left LE Ulcer Electronic Signature(s) Signed: 07/30/2023 10:46:25 AM By: Allen Derry PA-C Entered By: Allen Derry on 07/30/2023 10:46:25 -------------------------------------------------------------------------------- HPI Details Patient Name: Date of Service: [name], [name] 07/30/2023 10:15 [name] M Medical Record Number: [id number] Patient Account Number: [id number] Date of Birth/Sex: Treating RN: 08-Aug-1946 (77 y.o. 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 the patient has had [name] vascular workup over 7 years ago and said everything was normal at that stage. He does not have any chronic problems except for cardiac issues which he sees [name] cardiologist in [place]. 08/15/2017 -- arterial and venous duplex studies still pending. 08/23/2017 -- venous reflux studies done on 08/13/2017  shows venous incompetence throughout the left lower extremity deep system and focally at the left saphenofemoral junction. 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 No evidence of SVT or DVT in bilateral lower extremities The patient has an appointment at the end of the month to get his arterial duplex study done 09/05/2017 -- the patient was seen at the vein and vascular office yesterday by Bary Castilla. ABI studies were notable for medial calcification and the toe brachial indices were normal and bilateral ankle-brachial) waveforms were normal with triphasic flow. After review of his venous studies he was not [name] candidate for laser ablation and his lymphedema was to be treated with compression stockings and lymphedema pump pumps 09/12/2017 -- had [name] low arterial study done at the [facility] -- unable to obtain reliable ABI is due to medial calcification. Bilateral toe FREDYS, VLASIC ([id number]) [id number] Page 2 of 11 indices were normal with the right being 1.01 and the left being 0.92 and the waveforms were triphasic bilaterally. he did get hold of 30-40 mm compression stockings but is unable to put these on. We will try and get him alternative compression stockings. 09/26/17- he is here in follow up evaluation of [name] right lower extremity ulcer;he is compliant in wearing compression stocking; ulcer almost epithelialized , anticipate healing next appointment Readmission: 11/17 point upon evaluation patient's wound currently that he is seeing [place] for today is [name] skin cancerous lesion that was cleared away by his dermatologist on the left medial calf region. He tells me that this is [name] very similar thing to what he had done previously in fact the last time he saw him in 2018 this was also what was going on at that point. Nonetheless he feels that based on what he seeing currently that this is just having [name] lot of harder time  healing although it is much closer to the surface than what he is experienced in the past.  tiny area that I think needs 1 week to toughen up before we cut him loose completely. 07-16-2023 upon evaluation today patient appears to be doing well currently in regard to his wounds. In fact everything is completely healed and looks to be doing great. Fortunately I do not see any signs of active infection at this time which is great news. No fevers, chills, nausea, vomiting, or diarrhea. 07-30-2023 upon evaluation today patient unfortunately has [name] breakdown of the wound on the lateral aspect of his leg. Unfortunately I am [name] little  concerned about the overall appearance of the leg I feel like he may require [name] biopsy to further evaluate the situation. I discussed that with the patient today. With that being said I previously been asked by Dr. Adolphus Birchwood not to perform any of the biopsies myself but to let them see it prior to the biopsy. For that reason I would go ahead and see about making referral to Dr. Durene Cal office to get this scheduled as quickly as possible is my hope. We reached out to them have not heard back as of yet. Electronic Signature(s) Signed: 07/30/2023 1:39:13 PM By: Allen Derry PA-C Entered By: Allen Derry on 07/30/2023 13:39:13 -------------------------------------------------------------------------------- Physical Exam Details Patient Name: Date of Service: [name], [name] 07/30/2023 10:15 [name] M Medical Record Number: [id number] Patient Account Number: [id number] Date of Birth/Sex: Treating RN: 31-Jan-1946 (77 y.o. [name] Primary Care Provider: [name] Other Clinician: Referring Provider: Treating Provider/Extender: [name] in Treatment: 10 Constitutional Well-nourished and well-hydrated in no acute distress. Respiratory normal breathing without difficulty. Psychiatric this patient is able to make decisions and demonstrates good insight into disease process. Alert and Oriented x 3. pleasant and cooperative. LINC, FLAMENCO [name] ([id number]) [id number] Page 5 of 11 Notes Upon inspection patient's wound bed actually showed signs of good granulation although it was [name] little bit of hypergranulation at this point. Nonetheless he does have me [name] little bit concerned here to be honest. I feel like that the appearance is just [name] little bit off and I thought so even when we were treating this before but we had held off on making the referral for biopsy due to the fact that it was healing so well before to reopen at this point with him  wearing his compression socks on [name] regular basis and the appearance overall of this I feel like that we may be dealing with [name] skin cancer. Electronic Signature(s) Signed: 07/30/2023 1:39:47 PM By: Allen Derry PA-C Entered By: Allen Derry on 07/30/2023 13:39:46 -------------------------------------------------------------------------------- Physician Orders Details Patient Name: Date of Service: [name], [name] 07/30/2023 10:15 [name] M Medical Record Number: [id number] Patient Account Number: [id number] Date of Birth/Sex: Treating RN: 1946-06-29 (77 y.o. 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 Bathing/ Shower/ Hygiene May shower with wound dressing protected with water repellent cover or cast protector. No tub bath. Edema Control -  tiny area that I think needs 1 week to toughen up before we cut him loose completely. 07-16-2023 upon evaluation today patient appears to be doing well currently in regard to his wounds. In fact everything is completely healed and looks to be doing great. Fortunately I do not see any signs of active infection at this time which is great news. No fevers, chills, nausea, vomiting, or diarrhea. 07-30-2023 upon evaluation today patient unfortunately has [name] breakdown of the wound on the lateral aspect of his leg. Unfortunately I am [name] little  concerned about the overall appearance of the leg I feel like he may require [name] biopsy to further evaluate the situation. I discussed that with the patient today. With that being said I previously been asked by Dr. Adolphus Birchwood not to perform any of the biopsies myself but to let them see it prior to the biopsy. For that reason I would go ahead and see about making referral to Dr. Durene Cal office to get this scheduled as quickly as possible is my hope. We reached out to them have not heard back as of yet. Electronic Signature(s) Signed: 07/30/2023 1:39:13 PM By: Allen Derry PA-C Entered By: Allen Derry on 07/30/2023 13:39:13 -------------------------------------------------------------------------------- Physical Exam Details Patient Name: Date of Service: [name], [name] 07/30/2023 10:15 [name] M Medical Record Number: [id number] Patient Account Number: [id number] Date of Birth/Sex: Treating RN: 31-Jan-1946 (77 y.o. [name] Primary Care Provider: [name] Other Clinician: Referring Provider: Treating Provider/Extender: [name] in Treatment: 10 Constitutional Well-nourished and well-hydrated in no acute distress. Respiratory normal breathing without difficulty. Psychiatric this patient is able to make decisions and demonstrates good insight into disease process. Alert and Oriented x 3. pleasant and cooperative. LINC, FLAMENCO [name] ([id number]) [id number] Page 5 of 11 Notes Upon inspection patient's wound bed actually showed signs of good granulation although it was [name] little bit of hypergranulation at this point. Nonetheless he does have me [name] little bit concerned here to be honest. I feel like that the appearance is just [name] little bit off and I thought so even when we were treating this before but we had held off on making the referral for biopsy due to the fact that it was healing so well before to reopen at this point with him  wearing his compression socks on [name] regular basis and the appearance overall of this I feel like that we may be dealing with [name] skin cancer. Electronic Signature(s) Signed: 07/30/2023 1:39:47 PM By: Allen Derry PA-C Entered By: Allen Derry on 07/30/2023 13:39:46 -------------------------------------------------------------------------------- Physician Orders Details Patient Name: Date of Service: [name], [name] 07/30/2023 10:15 [name] M Medical Record Number: [id number] Patient Account Number: [id number] Date of Birth/Sex: Treating RN: 1946-06-29 (77 y.o. 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 Bathing/ Shower/ Hygiene May shower with wound dressing protected with water repellent cover or cast protector. No tub bath. Edema Control -  Orders / Instructions UrgoK2 Wound Treatment Wound #21R - Lower Leg Wound Laterality: Left, Lateral Cleanser: Soap and Water 1 x Per Week/30 Days Discharge Instructions: Gently cleanse wound with antibacterial soap, rinse and pat dry prior to dressing wounds Cleanser: Vashe 5.8 (oz) 1 x Per Week/30 Days Discharge Instructions: Use vashe 5.8 (oz) as directed Prim Dressing: Hydrofera Blue Ready Transfer Foam, 2.5x2.5 (in/in) 1 x Per  Week/30 Days ary Discharge Instructions: Apply Hydrofera Blue Ready to wound bed as directed Secondary Dressing: Zetuvit Plus 4x4 (in/in) 1 x Per Week/30 Days Compression Wrap: Urgo K2, two layer compression system, regular 1 x Per Week/30 Days [name] ([id number]) [id number] Page 6 of 11 Electronic Signature(s) Signed: 08/07/2023 4:35:36 PM By: Allen Derry PA-C Signed: 08/07/2023 5:05:25 PM By: [name] MSN RN CNS WTA Previous Signature: 08/06/2023 4:37:04 PM Version By: [name] MSN RN CNS WTA Entered By: [name] on 08/06/2023 16:38:03 -------------------------------------------------------------------------------- Problem List Details Patient Name: Date of Service: [name], [name] 07/30/2023 10:15 [name] M Medical Record Number: [id number] Patient Account Number: [id number] Date of Birth/Sex: Treating RN: 11/03/1945 (77 y.o. 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/05/2023 No Yes I73.89 Other specified peripheral vascular diseases 05/16/2023 No Yes I48.0 Paroxysmal atrial fibrillation 05/16/2023 No Yes I10 Essential (primary) hypertension 05/16/2023 No Yes Inactive Problems Resolved Problems Electronic Signature(s) Signed: 07/30/2023 10:46:23 AM By: Allen Derry PA-C Entered By: Allen Derry on 07/30/2023 10:46:23 [name] ([id number]) [id number] Page 7 of  11 -------------------------------------------------------------------------------- Progress Note Details Patient Name: Date of Service: [name] 07/30/2023 10:15 [name] M Medical Record Number: [id number] Patient Account Number: [id number] Date of Birth/Sex: Treating RN: [birth year] (77 y.o. [name] Primary Care Provider: [name] Other Clinician: Referring Provider: Treating Provider/Extender: [name] in Treatment: 10 Subjective Chief Complaint Information obtained from Patient Left LE Ulcer History of Present Illness (HPI) 77 year old male who has [name] past medical history of essential hypertension, chronic atrial fibrillation, peripheral vascular disease, nonischemic cardiomyopathy,venous stasis dermatitis, gouty arthropathy, basal cell carcinoma of the right lower extremity, benign prostatic hypertrophy, long-term use of anticoagulation therapy, hyperglycemia and exercise intolerance has never been [name] smoker. the patient has had [name] vascular workup over 7 years ago and said everything was normal at that stage. He does not have any chronic problems except for cardiac issues which he sees [name] cardiologist in [place]. 08/15/2017 -- arterial and venous duplex studies still pending. 08/23/2017 -- venous reflux studies done on 08/13/2017 shows venous incompetence throughout the left lower extremity deep system and focally at the left saphenofemoral junction. 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 No evidence of SVT or DVT in bilateral lower extremities The patient has an appointment at the end of the month to get his arterial duplex study done 09/05/2017 -- the patient was seen at the vein and vascular office yesterday by Bary Castilla. ABI studies were notable for medial calcification and the toe brachial indices were normal and bilateral ankle-brachial) waveforms were normal with triphasic flow. After review of his venous  studies he was not  with [name] skin cancer. Integumentary (Hair, Skin) Wound #21R status is Open. Original cause of wound was Gradually Appeared. The date acquired was: 04/08/2023. The wound has been in treatment 10 weeks. The wound is located on the Left,Lateral Lower Leg. The wound measures 3cm length x 2cm width x 0.1cm depth; 4.712cm^2 area and 0.471cm^3 volume. There is [name] none present amount of drainage noted. There is large (67-100%) hyper - granulation within the wound bed. There is no necrotic tissue within the wound bed. 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 I recommend that we have the patient continue to monitor for any signs of infection or worsening. Based  on what I see right now we will going go and reinitiate [facility] which I think is probably best thing to do would also use the Urgo K2 compression wrap. 2. I am going to recommend that he continue to elevate his legs he should also continue with the compression wrap on the right leg. 3. I am also going to see about getting an appointment with Dr. Adolphus Birchwood as quickly as possible. Hopefully this will not take too long to get him in for biopsy. We will see patient back for reevaluation in 1 week here in the clinic. If anything worsens or changes patient will contact our office for additional recommendations. Electronic Signature(s) Signed: 07/30/2023 1:40:29 PM By: Allen Derry PA-C Entered By: Allen Derry on 07/30/2023 13:40:29 KASEAN, MATCZAK [name] ([id number]) [id number] Page 11 of 11 -------------------------------------------------------------------------------- SuperBill Details Patient Name: Date of Service: [name] 07/30/2023 Medical Record Number: [id number] Patient Account Number: [id number] Date of Birth/Sex: Treating RN: 11-Oct-1945 (77 y.o. [name] Primary Care Provider: [name] Other Clinician: Referring Provider: Treating Provider/Extender: [name] in Treatment: 10 Diagnosis Coding ICD-10 Codes Code Description I89.0 Lymphedema, not elsewhere classified 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 L97.822 Non-pressure chronic ulcer of other part of left lower leg with fat layer exposed I73.89 Other specified peripheral vascular diseases I48.0 Paroxysmal atrial fibrillation I10 Essential (primary) hypertension Facility Procedures : CPT4 Code: 56387564 Description: (Facility Use Only) 29581LT - APPLY MULTLAY COMPRS LWR LT LEG Modifier: Quantity: 1 Physician Procedures : CPT4 Code Description Modifier [id number] - WC PHYS LEVEL 3 -  EST PT ICD-10 Diagnosis Description I89.0 Lymphedema, not elsewhere classified 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  L97.822 Non-pressure chronic ulcer of other part of left lower leg with fat layer exposed I73.89 Other specified peripheral vascular diseases Quantity: 1 Electronic Signature(s) Signed: 08/06/2023 4:38:24 PM By: [name] MSN RN CNS WTA Signed: 08/07/2023 4:35:36 PM By: Allen Derry PA-C Previous Signature: 07/30/2023 1:40:49 PM Version By: Allen Derry PA-C Entered By: [name] on 08/06/2023 16:38:24  [name] [name] ([id number]) [id number] Page 1 of 11 Visit Report for 07/30/2023 Chief Complaint Document Details Patient Name: Date of Service: [name] 07/30/2023 10:15 [name] M Medical Record Number: [id number] Patient Account Number: [id number] Date of Birth/Sex: Treating RN: 18-Dec-1945 (77 y.o. [name] Primary Care Provider: [name] Other Clinician: Referring Provider: Treating Provider/Extender: [name] in Treatment: 10 Information Obtained from: Patient Chief Complaint Left LE Ulcer Electronic Signature(s) Signed: 07/30/2023 10:46:25 AM By: Allen Derry PA-C Entered By: Allen Derry on 07/30/2023 10:46:25 -------------------------------------------------------------------------------- HPI Details Patient Name: Date of Service: [name], [name] 07/30/2023 10:15 [name] M Medical Record Number: [id number] Patient Account Number: [id number] Date of Birth/Sex: Treating RN: 08-Aug-1946 (77 y.o. 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 the patient has had [name] vascular workup over 7 years ago and said everything was normal at that stage. He does not have any chronic problems except for cardiac issues which he sees [name] cardiologist in [place]. 08/15/2017 -- arterial and venous duplex studies still pending. 08/23/2017 -- venous reflux studies done on 08/13/2017  shows venous incompetence throughout the left lower extremity deep system and focally at the left saphenofemoral junction. 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 No evidence of SVT or DVT in bilateral lower extremities The patient has an appointment at the end of the month to get his arterial duplex study done 09/05/2017 -- the patient was seen at the vein and vascular office yesterday by Bary Castilla. ABI studies were notable for medial calcification and the toe brachial indices were normal and bilateral ankle-brachial) waveforms were normal with triphasic flow. After review of his venous studies he was not [name] candidate for laser ablation and his lymphedema was to be treated with compression stockings and lymphedema pump pumps 09/12/2017 -- had [name] low arterial study done at the [place] [facility] -- unable to obtain reliable ABI is due to medial calcification. Bilateral toe FREDYS, VLASIC ([id number]) [id number] Page 2 of 11 indices were normal with the right being 1.01 and the left being 0.92 and the waveforms were triphasic bilaterally. he did get hold of 30-40 mm compression stockings but is unable to put these on. We will try and get him alternative compression stockings. 09/26/17- he is here in follow up evaluation of [name] right lower extremity ulcer;he is compliant in wearing compression stocking; ulcer almost epithelialized , anticipate healing next appointment Readmission: 11/17 point upon evaluation patient's wound currently that he is seeing [place] for today is [name] skin cancerous lesion that was cleared away by his dermatologist on the left medial calf region. He tells me that this is [name] very similar thing to what he had done previously in fact the last time he saw him in 2018 this was also what was going on at that point. Nonetheless he feels that based on what he seeing currently that this is just having [name] lot of harder time  healing although it is much closer to the surface than what he is experienced in the past.  [name] [name] ([id number]) [id number] Page 1 of 11 Visit Report for 07/30/2023 Chief Complaint Document Details Patient Name: Date of Service: [name] 07/30/2023 10:15 [name] M Medical Record Number: [id number] Patient Account Number: [id number] Date of Birth/Sex: Treating RN: 18-Dec-1945 (77 y.o. [name] Primary Care Provider: [name] Other Clinician: Referring Provider: Treating Provider/Extender: [name] in Treatment: 10 Information Obtained from: Patient Chief Complaint Left LE Ulcer Electronic Signature(s) Signed: 07/30/2023 10:46:25 AM By: Allen Derry PA-C Entered By: Allen Derry on 07/30/2023 10:46:25 -------------------------------------------------------------------------------- HPI Details Patient Name: Date of Service: [name], [name] 07/30/2023 10:15 [name] M Medical Record Number: [id number] Patient Account Number: [id number] Date of Birth/Sex: Treating RN: 08-Aug-1946 (77 y.o. 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 the patient has had [name] vascular workup over 7 years ago and said everything was normal at that stage. He does not have any chronic problems except for cardiac issues which he sees [name] cardiologist in [place]. 08/15/2017 -- arterial and venous duplex studies still pending. 08/23/2017 -- venous reflux studies done on 08/13/2017  shows venous incompetence throughout the left lower extremity deep system and focally at the left saphenofemoral junction. 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 No evidence of SVT or DVT in bilateral lower extremities The patient has an appointment at the end of the month to get his arterial duplex study done 09/05/2017 -- the patient was seen at the vein and vascular office yesterday by Bary Castilla. ABI studies were notable for medial calcification and the toe brachial indices were normal and bilateral ankle-brachial) waveforms were normal with triphasic flow. After review of his venous studies he was not [name] candidate for laser ablation and his lymphedema was to be treated with compression stockings and lymphedema pump pumps 09/12/2017 -- had [name] low arterial study done at the [place] [facility] -- unable to obtain reliable ABI is due to medial calcification. Bilateral toe FREDYS, VLASIC ([id number]) [id number] Page 2 of 11 indices were normal with the right being 1.01 and the left being 0.92 and the waveforms were triphasic bilaterally. he did get hold of 30-40 mm compression stockings but is unable to put these on. We will try and get him alternative compression stockings. 09/26/17- he is here in follow up evaluation of [name] right lower extremity ulcer;he is compliant in wearing compression stocking; ulcer almost epithelialized , anticipate healing next appointment Readmission: 11/17 point upon evaluation patient's wound currently that he is seeing [place] for today is [name] skin cancerous lesion that was cleared away by his dermatologist on the left medial calf region. He tells me that this is [name] very similar thing to what he had done previously in fact the last time he saw him in 2018 this was also what was going on at that point. Nonetheless he feels that based on what he seeing currently that this is just having [name] lot of harder time  healing although it is much closer to the surface than what he is experienced in the past.  with [name] skin cancer. Integumentary (Hair, Skin) Wound #21R status is Open. Original cause of wound was Gradually Appeared. The date acquired was: 04/08/2023. The wound has been in treatment 10 weeks. The wound is located on the Left,Lateral Lower Leg. The wound measures 3cm length x 2cm width x 0.1cm depth; 4.712cm^2 area and 0.471cm^3 volume. There is [name] none present amount of drainage noted. There is large (67-100%) hyper - granulation within the wound bed. There is no necrotic tissue within the wound bed. 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 I recommend that we have the patient continue to monitor for any signs of infection or worsening. Based  on what I see right now we will going go and reinitiate [facility] which I think is probably best thing to do would also use the Urgo K2 compression wrap. 2. I am going to recommend that he continue to elevate his legs he should also continue with the compression wrap on the right leg. 3. I am also going to see about getting an appointment with Dr. Adolphus Birchwood as quickly as possible. Hopefully this will not take too long to get him in for biopsy. We will see patient back for reevaluation in 1 week here in the clinic. If anything worsens or changes patient will contact our office for additional recommendations. Electronic Signature(s) Signed: 07/30/2023 1:40:29 PM By: Allen Derry PA-C Entered By: Allen Derry on 07/30/2023 13:40:29 KASEAN, MATCZAK [name] ([id number]) [id number] Page 11 of 11 -------------------------------------------------------------------------------- SuperBill Details Patient Name: Date of Service: [name] 07/30/2023 Medical Record Number: [id number] Patient Account Number: [id number] Date of Birth/Sex: Treating RN: 11-Oct-1945 (77 y.o. [name] Primary Care Provider: [name] Other Clinician: Referring Provider: Treating Provider/Extender: [name] in Treatment: 10 Diagnosis Coding ICD-10 Codes Code Description I89.0 Lymphedema, not elsewhere classified 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 L97.822 Non-pressure chronic ulcer of other part of left lower leg with fat layer exposed I73.89 Other specified peripheral vascular diseases I48.0 Paroxysmal atrial fibrillation I10 Essential (primary) hypertension Facility Procedures : CPT4 Code: 56387564 Description: (Facility Use Only) 29581LT - APPLY MULTLAY COMPRS LWR LT LEG Modifier: Quantity: 1 Physician Procedures : CPT4 Code Description Modifier [id number] - WC PHYS LEVEL 3 -  EST PT ICD-10 Diagnosis Description I89.0 Lymphedema, not elsewhere classified 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  L97.822 Non-pressure chronic ulcer of other part of left lower leg with fat layer exposed I73.89 Other specified peripheral vascular diseases Quantity: 1 Electronic Signature(s) Signed: 08/06/2023 4:38:24 PM By: [name] MSN RN CNS WTA Signed: 08/07/2023 4:35:36 PM By: Allen Derry PA-C Previous Signature: 07/30/2023 1:40:49 PM Version By: Allen Derry PA-C Entered By: [name] on 08/06/2023 16:38:24  tiny area that I think needs 1 week to toughen up before we cut him loose completely. 07-16-2023 upon evaluation today patient appears to be doing well currently in regard to his wounds. In fact everything is completely healed and looks to be doing great. Fortunately I do not see any signs of active infection at this time which is great news. No fevers, chills, nausea, vomiting, or diarrhea. 07-30-2023 upon evaluation today patient unfortunately has [name] breakdown of the wound on the lateral aspect of his leg. Unfortunately I am [name] little  concerned about the overall appearance of the leg I feel like he may require [name] biopsy to further evaluate the situation. I discussed that with the patient today. With that being said I previously been asked by Dr. Adolphus Birchwood not to perform any of the biopsies myself but to let them see it prior to the biopsy. For that reason I would go ahead and see about making referral to Dr. Durene Cal office to get this scheduled as quickly as possible is my hope. We reached out to them have not heard back as of yet. Electronic Signature(s) Signed: 07/30/2023 1:39:13 PM By: Allen Derry PA-C Entered By: Allen Derry on 07/30/2023 13:39:13 -------------------------------------------------------------------------------- Physical Exam Details Patient Name: Date of Service: [name], [name] 07/30/2023 10:15 [name] M Medical Record Number: [id number] Patient Account Number: [id number] Date of Birth/Sex: Treating RN: 31-Jan-1946 (77 y.o. [name] Primary Care Provider: [name] Other Clinician: Referring Provider: Treating Provider/Extender: [name] in Treatment: 10 Constitutional Well-nourished and well-hydrated in no acute distress. Respiratory normal breathing without difficulty. Psychiatric this patient is able to make decisions and demonstrates good insight into disease process. Alert and Oriented x 3. pleasant and cooperative. LINC, FLAMENCO [name] ([id number]) [id number] Page 5 of 11 Notes Upon inspection patient's wound bed actually showed signs of good granulation although it was [name] little bit of hypergranulation at this point. Nonetheless he does have me [name] little bit concerned here to be honest. I feel like that the appearance is just [name] little bit off and I thought so even when we were treating this before but we had held off on making the referral for biopsy due to the fact that it was healing so well before to reopen at this point with him  wearing his compression socks on [name] regular basis and the appearance overall of this I feel like that we may be dealing with [name] skin cancer. Electronic Signature(s) Signed: 07/30/2023 1:39:47 PM By: Allen Derry PA-C Entered By: Allen Derry on 07/30/2023 13:39:46 -------------------------------------------------------------------------------- Physician Orders Details Patient Name: Date of Service: [name], [name] 07/30/2023 10:15 [name] M Medical Record Number: [id number] Patient Account Number: [id number] Date of Birth/Sex: Treating RN: 1946-06-29 (77 y.o. 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 Bathing/ Shower/ Hygiene May shower with wound dressing protected with water repellent cover or cast protector. No tub bath. Edema Control -

## 2023-08-01 ENCOUNTER — Ambulatory Visit: Payer: Medicare PPO

## 2023-08-05 DIAGNOSIS — I7389 Other specified peripheral vascular diseases: Secondary | ICD-10-CM | POA: Diagnosis not present

## 2023-08-05 DIAGNOSIS — M109 Gout, unspecified: Secondary | ICD-10-CM | POA: Diagnosis not present

## 2023-08-05 DIAGNOSIS — I87333 Chronic venous hypertension (idiopathic) with ulcer and inflammation of bilateral lower extremity: Secondary | ICD-10-CM | POA: Diagnosis not present

## 2023-08-05 DIAGNOSIS — I480 Paroxysmal atrial fibrillation: Secondary | ICD-10-CM | POA: Diagnosis not present

## 2023-08-05 DIAGNOSIS — I890 Lymphedema, not elsewhere classified: Secondary | ICD-10-CM | POA: Diagnosis not present

## 2023-08-05 DIAGNOSIS — I10 Essential (primary) hypertension: Secondary | ICD-10-CM | POA: Diagnosis not present

## 2023-08-05 DIAGNOSIS — Z7901 Long term (current) use of anticoagulants: Secondary | ICD-10-CM | POA: Diagnosis not present

## 2023-08-05 DIAGNOSIS — L97822 Non-pressure chronic ulcer of other part of left lower leg with fat layer exposed: Secondary | ICD-10-CM | POA: Diagnosis not present

## 2023-08-05 NOTE — Progress Notes (Signed)
RENAN, CORWELL [name] ([id number]) [id number] Page 1 of 3 Visit Report for 08/05/2023 Arrival Information Details Patient Name: Date of Service: [name] 08/05/2023 7:45 [name] Medical Record Number: [id number] Patient Account Number: [id number] Date of Birth/Sex: Treating RN: [birth year] (77 y.o. [name] Primary Care [name]: [name] Other Clinician: Referring [name]: Treating [name]/Extender: [name] in Treatment: 11 Visit Information History Since Last Visit Added or deleted any medications: No Patient Arrived: Ambulatory Any new allergies or adverse reactions: No Arrival Time: 08:06 Has Dressing in Place as Prescribed: Yes Accompanied By: self Has Compression in Place as Prescribed: Yes Transfer Assistance: None Pain Present Now: No Patient Identification Verified: Yes Secondary Verification Process Completed: Yes Patient Requires Transmission-Based Precautions: No Patient Has Alerts: No Electronic Signature(s) Signed: 08/05/2023 4:36:05 PM By: [name] Aver MSN RN CNS WTA Entered By: [name] on 08/05/2023 05:06:43 -------------------------------------------------------------------------------- Compression Therapy Details Patient Name: Date of Service: [name] [name], [name] 08/05/2023 7:45 [name] Medical Record Number: [id number] Patient Account Number: [id number] Date of Birth/Sex: Treating RN: February 05, 1946 (77 y.o. [name] Primary Care Kamla Skilton: [name] Other Clinician: Referring [name]: Treating [name]/Extender: [name] in Treatment: 11 Compression Therapy Performed for Wound Assessment: Wound #21R Left,Lateral Lower Leg Performed By: Clinician [name] Aver, RN Compression Type: Four Layer Electronic Signature(s) Signed: 08/05/2023 4:36:05 PM By: [name] Aver MSN RN CNS WTA Entered By: [name] on 08/05/2023 05:07:37 [name], [name] ([id number])  [id number] Page 2 of 3 -------------------------------------------------------------------------------- Encounter Discharge Information Details Patient Name: Date of Service: [name] 08/05/2023 7:45 [name] Medical Record Number: [id number] Patient Account Number: [id number] Date of Birth/Sex: Treating RN: 09/10/1946 (77 y.o. [name] Primary Care Bobbette Eakes: [name] Other Clinician: Referring [name]: Treating Alayla Dethlefs/Extender: [name] in Treatment: 11 Encounter Discharge Information Items Discharge Condition: Stable Ambulatory Status: Ambulatory Discharge Destination: Home Transportation: Private Auto Accompanied By: self Schedule Follow-up Appointment: Yes Clinical Summary of Care: Electronic Signature(s) Signed: 08/05/2023 4:36:05 PM By: [name] Aver MSN RN CNS WTA Entered By: [name] on 08/05/2023 05:09:00 -------------------------------------------------------------------------------- Wound Assessment Details Patient Name: Date of Service: [name] [name], [name] 08/05/2023 7:45 [name] Medical Record Number: [id number] Patient Account Number: [id number] Date of Birth/Sex: Treating RN: 11-26-1945 (77 y.o. [name] Primary Care Kemari Mares: [name] Other Clinician: Referring [name]: Treating [name]/Extender: [name] in Treatment: 11 Wound Status Wound Number: 21R Primary Etiology: Vasculitis Wound Location: Left, Lateral Lower Leg Wound Status: Open Wounding Event: Gradually Appeared Comorbid History: Arrhythmia, Hypertension, Gout Date Acquired: 04/08/2023 Weeks Of Treatment: 11 Clustered Wound: No Wound Measurements Length: (cm) 3 Width: (cm) 2 Depth: (cm) 0.1 Area: (cm) 4.712 Volume: (cm) 0.471 % Reduction in Area: 65.7% % Reduction in Volume: 65.7% Epithelialization: Large (67-100%) Wound Description Classification: Full Thickness Without Exposed  Support [name], [name] ([id number]) Exudate Amount: None Present Structures Foul Odor After Cleansing: No [phone].pdf Page 3 of 3 Slough/Fibrino No Wound Bed Granulation Amount: Large (67-100%) Exposed Structure Granulation Quality: Hyper-granulation Fascia Exposed: No Necrotic Amount: None Present (0%) Fat Layer (Subcutaneous Tissue) Exposed: No Tendon Exposed: No Muscle Exposed: No Joint Exposed: No Bone Exposed: No Treatment Notes Wound #21R (Lower Leg) Wound Laterality: Left, Lateral Cleanser Soap and Water Discharge Instruction: Gently cleanse wound with antibacterial soap, rinse and pat dry prior to dressing wounds Wound Cleanser Discharge Instruction: Wash your hands with soap and water.  Remove old dressing, discard into plastic bag and place into trash. Cleanse the wound with Wound Cleanser prior to applying [name] clean dressing using gauze sponges, not tissues or cotton balls. Do not scrub or use excessive force. Pat dry using gauze sponges, not tissue or cotton balls. Peri-Wound Care [name] Discharge Instruction: Apply [name] as directed Topical Primary Dressing Hydrofera Blue Ready Transfer Foam, 2.5x2.5 (in/in) Discharge Instruction: Apply Hydrofera Blue Ready to wound bed as directed Curad Oil Emulsion Dressing 3x3 (in/in) Discharge Instruction: prior to HB Secondary Dressing Zetuvit Plus 4x8 (in/in) Secured With Compression Wrap Urgo K2, two layer compression system, regular Compression Stockings Add-Ons Electronic Signature(s) Signed: 08/05/2023 4:36:05 PM By: [name] Aver MSN RN CNS WTA Entered By: [name] on 08/05/2023 05:07:17

## 2023-08-05 NOTE — Progress Notes (Signed)
KORTEZ, HAAGEN R ([id number]) [id number] Page 1 of 2 Visit Report for 08/05/2023 Physician Orders Details Patient Name: Date of Service: [name] 08/05/2023 7:45 A M Medical Record Number: [id number] Patient Account Number: [id number] Date of Birth/Sex: Treating RN: [birth year] (77 y.o. [name] Primary Care Provider: [name] Other Clinician: Referring Provider: Treating Provider/Extender: [name] in Treatment: [phone] Verbal / Phone Orders: No Diagnosis Coding Follow-up Appointments Return Appointment in 1 week. Bathing/ Shower/ Hygiene May shower; gently cleanse wound with antibacterial soap, rinse and pat dry prior to dressing wounds Wound Treatment Wound #21R - Lower Leg Wound Laterality: Left, Lateral Cleanser: Soap and Water 1 x Per Week/30 Days Discharge Instructions: Gently cleanse wound with antibacterial soap, rinse and pat dry prior to dressing wounds Cleanser: Wound Cleanser 1 x Per Week/30 Days Discharge Instructions: Wash your hands with soap and water. Remove old dressing, discard into plastic bag and place into trash. Cleanse the wound with Wound Cleanser prior to applying a clean dressing using gauze sponges, not tissues or cotton balls. Do not scrub or use excessive force. Pat dry using gauze sponges, not tissue or cotton balls. Peri-Wound Care: AandD Ointment 1 x Per Week/30 Days Discharge Instructions: Apply AandD Ointment as directed Prim Dressing: Hydrofera Blue Ready Transfer Foam, 2.5x2.5 (in/in) 1 x Per Week/30 Days ary Discharge Instructions: Apply Hydrofera Blue Ready to wound bed as directed Prim Dressing: Curad Oil Emulsion Dressing 3x3 (in/in) ary 1 x Per Week/30 Days Discharge Instructions: prior to HB Secondary Dressing: Zetuvit Plus 4x8 (in/in) 1 x Per Week/30 Days Compression Wrap: Urgo K2, two layer compression system, regular 1 x Per Week/30 Days Electronic  Signature(s) Signed: 08/05/2023 2:19:13 PM By: Allen Derry PA-C Signed: 08/05/2023 4:36:05 PM By: [name] MSN RN CNS WTA Entered By: [name] on 08/05/2023 05:08:27 [name] ([id number]) [id number] Page 2 of 2 -------------------------------------------------------------------------------- SuperBill Details Patient Name: Date of Service: [name] 08/05/2023 Medical Record Number: [id number] Patient Account Number: [id number] Date of Birth/Sex: Treating RN: 02-23-1946 (77 y.o. 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

## 2023-08-08 ENCOUNTER — Encounter: Payer: Medicare PPO | Admitting: Physician Assistant

## 2023-08-08 DIAGNOSIS — I480 Paroxysmal atrial fibrillation: Secondary | ICD-10-CM | POA: Diagnosis not present

## 2023-08-08 DIAGNOSIS — I87333 Chronic venous hypertension (idiopathic) with ulcer and inflammation of bilateral lower extremity: Secondary | ICD-10-CM | POA: Diagnosis not present

## 2023-08-08 DIAGNOSIS — L958 Other vasculitis limited to the skin: Secondary | ICD-10-CM | POA: Diagnosis not present

## 2023-08-08 DIAGNOSIS — I890 Lymphedema, not elsewhere classified: Secondary | ICD-10-CM | POA: Diagnosis not present

## 2023-08-08 DIAGNOSIS — L97822 Non-pressure chronic ulcer of other part of left lower leg with fat layer exposed: Secondary | ICD-10-CM | POA: Diagnosis not present

## 2023-08-08 DIAGNOSIS — Z7901 Long term (current) use of anticoagulants: Secondary | ICD-10-CM | POA: Diagnosis not present

## 2023-08-08 DIAGNOSIS — M109 Gout, unspecified: Secondary | ICD-10-CM | POA: Diagnosis not present

## 2023-08-08 DIAGNOSIS — I10 Essential (primary) hypertension: Secondary | ICD-10-CM | POA: Diagnosis not present

## 2023-08-08 DIAGNOSIS — I7389 Other specified peripheral vascular diseases: Secondary | ICD-10-CM | POA: Diagnosis not present

## 2023-08-08 NOTE — Progress Notes (Addendum)
[name], [name] ([id number]) [id number] Page 1 of 12 Visit Report for 08/08/2023 Chief Complaint Document Details Patient Name: Date of Service: [name] 08/08/2023 10:15 [name] M Medical Record Number: [id number] Patient Account Number: [id number] Date of Birth/Sex: Treating RN: 1946-05-10 (77 y.o. [name] Primary Care Provider: [name] Other Clinician: Referring Provider: Treating Provider/Extender: [name] in Treatment: 12 Information Obtained from: Patient Chief Complaint Left LE Ulcer Electronic Signature(s) Signed: 08/08/2023 10:19:25 AM By: Allen Derry PA-C Entered By: Allen Derry on 08/08/2023 07:19:25 -------------------------------------------------------------------------------- HPI Details Patient Name: Date of Service: [name], [name] 08/08/2023 10:15 [name] M Medical Record Number: [id number] Patient Account Number: [id number] Date of Birth/Sex: Treating RN: [birth year] (76 y.o. 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 the patient has had [name] vascular workup over 7 years ago and said everything was normal at that stage. He does not have any chronic problems except for cardiac issues which he sees [name] cardiologist in [place]. 08/15/2017 -- arterial and venous duplex studies still pending. 08/23/2017 -- venous reflux studies done on 08/13/2017  shows venous incompetence throughout the left lower extremity deep system and focally at the left saphenofemoral junction. 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 No evidence of SVT or DVT in bilateral lower extremities The patient has an appointment at the end of the month to get his arterial duplex study done 09/05/2017 -- the patient was seen at the vein and vascular office yesterday by Bary Castilla. ABI studies were notable for medial calcification and the toe brachial indices were normal and bilateral ankle-brachial) waveforms were normal with triphasic flow. After review of his venous studies he was not [name] candidate for laser ablation and his lymphedema was to be treated with compression stockings and lymphedema pump pumps 09/12/2017 -- had [name] low arterial study done at the [facility] -- unable to obtain reliable ABI is due to medial calcification. Bilateral toe JAMARQUIS, CRULL ([id number]) [id number] Page 2 of 12 indices were normal with the right being 1.01 and the left being 0.92 and the waveforms were triphasic bilaterally. he did get hold of 30-40 mm compression stockings but is unable to put these on. We will try and get him alternative compression stockings. 09/26/17- he is here in follow up evaluation of [name] right lower extremity ulcer;he is compliant in wearing compression stocking; ulcer almost epithelialized , anticipate healing next appointment Readmission: 11/17 point upon evaluation patient's wound currently that he is seeing [place] for today is [name] skin cancerous lesion that was cleared away by his dermatologist on the left medial calf region. He tells me that this is [name] very similar thing to what he had done previously in fact the last time he saw him in 2018 this was also what was going on at that point. Nonetheless he feels that based on what he seeing currently that this is just having [name] lot of harder time  healing although it is much closer to the surface than what he is experienced in the past.  He notes that the initial removal was in June 2022 which was this year this is now November and still has not closed. He does have some edema and definitely I think that there is some venous component to his slow healing here. Also think that we can do something better than Vaseline to try to help with getting this to clear up as quickly as possible. He does have [name] history of atrial fibrillation and is on Eliquis otherwise he really has no major medical problems that would affect wound healing. 09/07/2021 upon evaluation today patient actually appears to be doing significantly better after having wrapped him last week. Overall I think that this is making significant improvements at this time which is great news. I do not see any evidence of infection which is great news as well. No fevers, chills, nausea, vomiting, or diarrhea. 09/14/2021 upon evaluation today patient appears to be doing well with regard to his leg ulcer. He has been tolerating the dressing changes and overall I think that he is making excellent progress. I do not see any signs of active infection at this time. 09/21/2021 upon evaluation today patient actually appears to be making good progress with regard to his wound this is again measuring smaller today no debridement seems to be necessary. We have been using [name] silver collagen dressing and I think that is doing an awesome job. 09/28/2021 upon evaluation today patient appears to be doing well with regard to his leg currently. I do not see any signs of active infection at this time which is great news. No fevers, chills, nausea, vomiting, or diarrhea. I think this wound is very close to complete resolution. 10/12/2021 upon evaluation today patient actually appears to be doing awesome in regard to his leg ulcer. In fact this appears to be completely healed based on what I am seeing currently. I do  not see any evidence of active infection locally nor systemically at this time which is also great news. No fevers, chills, nausea, vomiting, or diarrhea. Readmission: 12/07/2021 upon evaluation today patient presents for readmission here in the clinic. He was discharged on 10/12/2021 is completely healed. Unfortunately this has reopened at this point and he is having continual issues with new blisters over both lower extremities. This is even worse than what we previously saw. Nonetheless we did actually check his ABIs today and it did reveal that his ABIs were 0.55 on the left and 0.57 on the right. Subsequently this is [name] definite change from his last arterial study which showed that he did have good blood flow at 1.01 on the right and 0.92 on the left and that was right at the beginning of 2019. Nonetheless based on what we see currently I do think he tolerated the 3 layer compression wrap but I do believe that we probably need to get him tested for his arterial flow in order to see where things stand and if there is something we can do there that would help prevent this from continue to be an ongoing issue. He did not utilize compression socks in the interim from when he was last here till this time. That something is probably going to need lifelong going forward as well. 3/9; patient presents for follow-up. He has no issues or complaints today. He tolerated the compression wrap well. He had ABIs with TBI's done. He denies signs of infection. 12/21/2021 upon evaluation today patient appears to be doing well with regard to the wounds on his legs. Both  are showing signs of significant improvement which is great news although I do believe some sharp debridement would be of benefit here as well. 12/28/2021 upon evaluation today patient appears to be doing well with regard to his wounds. Everything is showing signs of excellent improvement which I am very pleased about. I think that we are headed in the  right direction here. Fortunately there does not appear to be any evidence of infection which is great news there is [name] little bit of hypergranulation. 01/04/2022 upon evaluation today patient appears to be doing well with regard to his wounds 2 of them are healed 1 is almost so and the other 1 is significantly better. Overall I am extremely pleased with where we stand and I think that he is making excellent progress here. I do not see any evidence of active infection locally nor systemically at this time. 01-16-2022 upon evaluation today patient's wound on the left leg is showing signs of doing quite well. Has not completely cleared at this point but it is much improved. Fortunately I do not see any signs of infection at this time. No fevers, chills, nausea, vomiting, or diarrhea. 01-23-2022 upon evaluation today patient's wound of the left leg actually appears to be pretty much completely healed which is great news. I do not see any signs of active infection locally or systemically which is excellent. With that being said on the right leg what wound is measuring smaller the other 1 is [name] new wound that just showed up fortunately its not too bad. Has been using Xeroform here and that seems to be doing decently well which is great news. Unfortunately his blood pressure is significantly high we gave him the readings for the past 4-5 visits as well as [name] recommendation to make an appointment to go discuss this with his primary care provider patient states that he is going to look into doing this. 01-30-2022 upon evaluation today patient appears to be doing well with regard to his left leg everything appears to be healed. On the right leg the more anterior wound is healed the more medial wound that I been concerned about [name] possible skin cancer unfortunately still does not look great to me. I do believe that we should probably do [name] biopsy I have talked about it with him [name] few times I think though it is probably  time to go ahead and do this at this point. 02-09-2022 upon evaluation today patient appears to be doing well with regard to his legs. On the left this appears to be completely healed. On the right he does have 2 areas and be perfectly honest one of them is [name] skin cancer that he is going to the Mohs surgery clinic for the other seems to be healing nicely. Readmission: 08-02-2022 upon evaluation today patient appears for reevaluation here in our clinic concerning issues that he has been having with wounds over the bilateral lower extremities. I last saw him in May 2023 and at that point we had him completely healed. Unfortunately he is tells me this has broken down to some degree since that point. 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 He tells me this 1 seems to just be getting bigger and bigger not improving. Fortunately he is not having  any significant pain which is good news he does have quite [name] bit of swelling and he tells me that his fluid pills are not recommended for him to take daily but just in 3-day intervals here and there. 08-09-2022 upon evaluation today patient appears to be doing still somewhat poorly in regard to his legs although in general he does not appear to be feeling as good as he has been. Fortunately there does not appear to be any signs of infection which is good news. With that being said he is having some issues here with having and overall poor feeling in general which again is good I think going to be the biggest complicating factor. He actually seems to be coughing I do not hear any wheezing right now I did listen to his chest he did not have good airflow down low however makes me suspicious for bronchitis or even possibly pneumonia which could be part of what is going on  here as well. Fortunately I do not see any evidence of anything worsening in regard to his legs but I definitely believe that he needs to continue with the compression wraps he took them off yesterday to shower has not had anything on for 24 hours that is why his legs are so swollen today. With regard to his pathology report I did review that it showed some squamous abnormality but no signs of distinct carcinoma. With that being said it was [name], [name] ([id number]) [id number] Page 3 of 12 saying that it could be adjacent to [name] squamous cell carcinoma nonetheless my suggestion is can be that we have the patient take copy of this report and give it to his Mohs surgeon in order for them to see if there is anything they feel like needs to be done further. With that being said right now I feel like the primary thing is going to be for [place] to try to get his swelling down and keep that down into that hand since he is having so much drainage I believe we can have to bring him in for dressing changes twice [name] week doing [name] nurse visit on Mondays. 11/9; since the patient was last here he spent the night in the emergency room he received IV Lasix. Also received antibiotics although he was not discharged on either 1 of these. He also saw his cardiology office who put him on regular Lasix 20 mg [previously on as needed Lasix 20 mg]. Per our intake nurse the swelling in his legs is remarkably better but he still has bilateral lower extremity wounds. He still has wounds on the bilateral lower extremities most problematically on the left lateral calf. He has been using silver alginate under 3 layer compression. 08-23-2022 upon evaluation today patient appears to be doing much better than the last time I saw him 2 weeks ago. At that point I was very concerned about how he was doing he did see Dr. Sherrie Mustache his primary care provider they got him on some blood pressure medication in general his  color and overall appearance looks to be doing much improved compared to the last time I saw him. 09-04-2022 upon evaluation today patient appears to be doing well currently in regard to his wounds. Everything is showing signs of improvement which is great news. Fortunately there does not appear to be any signs of active infection locally or systemically at this time. No fevers, chills, nausea, vomiting, or diarrhea. 09-10-2022 upon evaluation today patient appears to be doing  better in regard to his wounds although the [facility] was extremely stuck to the wound bed. Fortunately there does not appear to be any signs of infection locally or systemically at this time which is great news. No fevers, chills, nausea, vomiting, or diarrhea. 09-17-2022 upon evaluation today patient appears to be doing well currently in regard to his wounds in general. The right leg actually showing signs of excellent improvement and very pleased with where things stand in that regard. Fortunately I do not see any evidence of infection locally or systemically at this time which is great news. No fevers, chills, nausea, vomiting, or diarrhea. 09-24-2022 upon evaluation today patient appears to be doing well currently in regard to his wounds. Things look to be doing quite well. 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 He is seeing his dermatologist tomorrow in that regard. With that being said other than that however he seems to really be making some pretty good progress here which is good news. No fevers, chills, nausea, vomiting, or diarrhea. 12/26; the patient has 2 open wounds remaining on the left leg. One is on the left anterior mid tibia and the other is on the right lateral knee just outside of the popliteal fossa. The latter wound apparently has been biopsied showing squamous cell  carcinoma. The patient has been to see dermatology Dr. Adolphus Birchwood who apparently is making him [name] referral to the [facility]. He does not yet have an appointment 10-12-2022 upon evaluation today patient appears to be doing well currently in regard to his wound. He has been tolerating the dressing changes without complication and overall feel like we are headed in the right direction. Fortunately I do not see any signs of infection locally or systemically at this time which is great news. No fevers, chills, nausea, vomiting, or diarrhea. 10-23-2022 upon evaluation today patient appears to be doing well currently in regard to his wound. He has been tolerating the dressing changes without complication. Fortunately there does not appear to be any signs of active infection locally nor systemically which is great news and overall I am extremely pleased with where we stand currently. No fevers, chills, nausea, vomiting, or diarrhea. 10-26-2022 upon evaluation today patient appears to be doing well currently in regard to his wounds. Everything is showing signs of improvement and this is great news. Fortunately I see no evidence of active infection systemically. He does seem to be doing much better in regard to the local infection in regard to his leg. The smell is also greatly improved. Overall I am extremely happy with where we stand today. This is after just [name] few days with the antibiotic on board. 11-05-2022 upon evaluation today patient appears to be doing well currently in regard to his wounds although the wound where they performed the Mohs surgery does look [name] little bit hyper granulated I think switching to [facility] may be better for him. He voiced understanding. Fortunately there does not appear to be any evidence of active infection locally nor systemically at this time. 11-12-2022 upon evaluation today patient appears to be doing better in regard to both wounds he has been tolerating  the dressing changes without complication. There is no signs of infection and in general I think you are doing quite well. No fevers, chills, nausea, vomiting, or diarrhea. 11-20-2022 upon  evaluation today patient appears to be doing well currently in regard to his wounds. He has been tolerating the dressing changes without complication. Fortunately there does not appear to be any signs of infection at this time. No fevers, chills, nausea, vomiting, or diarrhea. 11-27-2022 upon evaluation today patient appears to be doing somewhat poorly in regard to his leg in general he has [name] lot of areas where he looks like he had some spots that popped up. There with regard to new possible blisters. In general I am actually very concerned about the fact that the wrap may be causing some irritation here. I think that we can try to not do the wrap for 1 week, and given the prescription for mupirocin ointment which I will send into the pharmacy for him. 12-04-2022 upon evaluation today patient appears to be doing well currently in regard to his wounds in fact he appears to be pretty much completely healed based on what I am seeing at this point. I do not see any signs of active infection locally nor systemically at this time which is great news. No fevers, chills, nausea, vomiting, or diarrhea. 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 I discussed that with him today he is aware of the situation.-2024 upon evaluation patient presents for readmission here in the clinic actually last saw him December 04, 2022. With that being said previously his dermatologist had asked that if there was something the need to be addressed from [name] dermatology standpoint and specifically biopsy that we make referral back to them to allow them to do it which I am definitely happy to  do. 01-31-2023 upon evaluation today patient appears to be doing well currently in regard to his wound in fact this looks better he did see Dr. Adolphus Birchwood his dermatologist yesterday and they did perform [name] biopsy. With that being said he actually looks like he is doing much better Dr. Adolphus Birchwood feels like this may not be [name] cancerous lesion which will be very good news at the same time I definitely wanted to make sure especially considering his history and the way this looks when we saw him last week. Nonetheless I am extremely pleased with the fact that he is doing so much better at this point. 02-07-2023 upon evaluation today patient appears to be doing well currently in regard to his wound from the standpoint of size it has not gotten any larger. With that being said he does have some issues here still with what appears to be potentially some infection. Wound does not look quite as good as it did last week. We are still waiting on the results for the pathology. I did put [name] call into dermatology but I have not heard anything back from them as of yet. 02-14-2023 upon evaluation today patient appears to be doing well with regard to his wound infection is definitely under control and looks much better. Fortunately I do not see any signs of systemic infection with anyone locally I think this is improved greatly. 02-19-2023 upon evaluation today patient appears to be doing well currently in regard to his wound which is actually measuring smaller and looking much better. Fortunately I do not see any evidence of active infection locally nor systemically which is great news and overall I am extremely pleased with where we stand today. 02-26-2023  upon evaluation today patient appears to be doing well currently in regard to his wound. He is actually been tolerating the dressing changes without complication. Fortunately there does not appear to be any signs of active infection locally nor systemically which is great news and  overall I am extremely [name], DIEUJUSTE ([id number]) [id number] Page 4 of 12 pleased with where we stand currently. 03-12-2023 upon evaluation today patient's wound actually showed signs of excellent improvement. I am very pleased with where we stand I do believe that we are making good progress here. I do not see any signs of active infection. 03-19-2023 upon evaluation today patient actually appears to be making excellent progress in regard to his leg and getting this closed. In fact is just [name] very small area that is actually open at this point. I am actually very pleased with what we are seeing today. 03-26-2023 upon evaluation today patient appears to be doing well currently in regard to his leg which is actually showing signs of being completely healed. Fortunately I do not see any signs of active infection locally or systemically which is great news and in general I do believe that we are moving in the right direction here. Readmission: 05-16-2023 upon evaluation today patient presents for reevaluation here in the clinic concerning [name] wound on the left anterior/lateral lower extremity. This is similar to where the wound was last time I saw him but not exactly the same. Fortunately there does not appear to be any signs of active infection at this time which is great news. The patient's past medical history really has not changed significantly since last time I saw him. 05-24-2023 upon evaluation patient actually appears to be doing excellent in regard to his leg ulcer. He has been tolerating the dressing changes without complication in general I do feel like there were making excellent headway towards complete closure. I do not see any signs of active infection at this point. 8/29; left lateral lower leg in the setting of chronic venous insufficiency. We have been using Hydrofera Blue under Urgo K2. Wounds are making nice improvements 06-20-2023 upon evaluation today patient  appears to be doing well currently in regard to his leg ulcer. This is actually showing signs of being significantly smaller compared even last week's nurse visit this looks much better. Fortunately I do not see any need for sharp debridement he seems to be doing quite well. 06-27-2023 upon evaluation today patient appears to be doing well currently in regard to his wound. He is tolerating the dressing changes without complication. There is [name] little bit of hypergranulation on the use of silver nitrate today to help keep this under control. Fortunately I do not see any evidence of active infection locally or systemically which is great news. 07-04-2023 upon evaluation today patient appears to be doing well currently in regard to his wound. He has been tolerating the dressing changes without complication. Fortunately I do not see any signs of active infection locally or systemically which is great news and in general I do believe that he is making really good headway towards complete closure. This looks much better after starting the antibiotics. 07-09-2023 upon evaluation today patient appears to be doing excellent in regard to his leg which is actually significantly improved. I am very pleased with where we stand and I do believe that he is making really good headway towards getting this closed in fact it pretty much appears to be almost closed today there is just [name] very  tiny area that I think needs 1 week to toughen up before we cut him loose completely. 07-16-2023 upon evaluation today patient appears to be doing well currently in regard to his wounds. In fact everything is completely healed and looks to be doing great. Fortunately I do not see any signs of active infection at this time which is great news. No fevers, chills, nausea, vomiting, or diarrhea. 07-30-2023 upon evaluation today patient unfortunately has [name] breakdown of the wound on the lateral aspect of his leg. Unfortunately I am [name] little  concerned about the overall appearance of the leg I feel like he may require [name] biopsy to further evaluate the situation. I discussed that with the patient today. With that being said I previously been asked by Dr. Adolphus Birchwood not to perform any of the biopsies myself but to let them see it prior to the biopsy. For that reason I would go ahead and see about making referral to Dr. Durene Cal office to get this scheduled as quickly as possible is my hope. We reached out to them have not heard back as of yet. 08-08-2023 upon evaluation today patient's wound actually showed signs of doing really about the same with regard to his wound. There does not appear to be any signs of active infection at this time which is good news with that being said they did not biopsy this at the office today when he went to dermatology. This is unfortunate as I thought that we can do that now. I will get [name] try to see if we get in touch with Dr. Durene Cal office if were not able to get this done before I see him next week, going to just do the biopsy myself just to make sure we know what we are dealing with patient voiced understanding he is in agreement with the plan. Electronic Signature(s) Signed: 08/08/2023 3:25:03 PM By: Allen Derry PA-C Entered By: Allen Derry on 08/08/2023 12:25:03 -------------------------------------------------------------------------------- [name] Adu TISS Details Patient Name: Date of Service: [name], [name] 08/08/2023 10:15 [name] M Medical Record Number: [id number] Patient Account Number: [id number] Date of Birth/Sex: Treating RN: [birth year] (77 y.o. [name] Primary Care Provider: [name] Other Clinician: Referring Provider: Treating Provider/Extender: [name] in Treatment: 12 Procedure Performed for: Wound #21R Left,Lateral Lower Leg Performed By: Physician Allen Derry, PA-C The following information was scribed by: Kao, Conry,  Aris Everts ([id number]) [id number] Page 5 of 12 The information was scribed for: Allen Derry Post Procedure Diagnosis Same as Pre-procedure Electronic Signature(s) Signed: 08/08/2023 4:51:37 PM By: [name] MSN RN CNS WTA Entered By: [name] on 08/08/2023 08:14:52 -------------------------------------------------------------------------------- Physical Exam Details Patient Name: Date of Service: [name] [name], [name] 08/08/2023 10:15 [name] M Medical Record Number: [id number] Patient Account Number: [id number] Date of Birth/Sex: Treating RN: 1946-03-13 (77 y.o. [name] Primary Care Provider: [name] Other Clinician: Referring Provider: Treating Provider/Extender: [name] in Treatment: 12 Constitutional Well-nourished and well-hydrated in no acute distress. Respiratory normal breathing without difficulty. Psychiatric this patient is able to make decisions and demonstrates good insight into disease process. Alert and Oriented x 3. pleasant and cooperative. Notes Upon inspection patient's wound bed actually showed signs of good granulation although it was hypergranulation still the appearance just does not look quite right and again with the immediate reoccurrence and the appearance of this overall I really feel like there may be something abnormal to the tissue in  the region. Electronic Signature(s) Signed: 08/08/2023 3:25:21 PM By: Allen Derry PA-C Entered By: Allen Derry on 08/08/2023 12:25:20 -------------------------------------------------------------------------------- Physician Orders Details Patient Name: Date of Service: [name], [name] 08/08/2023 10:15 [name] M Medical Record Number: [id number] Patient Account Number: [id number] Date of Birth/Sex: Treating RN: [birth year] (77 y.o. 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 Bathing/ Shower/ Hygiene May shower; gently cleanse wound with antibacterial soap, rinse and pat dry prior to dressing wounds Wound Treatment Wound #21R - Lower Leg Wound Laterality: Left, Lateral Cleanser: Soap and Water 1 x Per Week/30 Days Discharge Instructions: Gently cleanse wound with antibacterial soap, rinse and pat dry prior to dressing wounds Cleanser: Wound Cleanser 1 x Per Week/30 Days Discharge Instructions: Wash your hands with soap and water. Remove old dressing, discard into plastic bag and place into trash. Cleanse the wound with Wound Cleanser prior to applying [name] clean dressing using gauze sponges, not tissues or cotton balls. Do not scrub or use excessive force. Pat dry using gauze sponges, not tissue or cotton balls. Peri-Wound Care: AandD Ointment 1 x Per Week/30 Days Discharge Instructions: Apply AandD Ointment as directed Prim Dressing: Hydrofera Blue Ready Transfer Foam, 2.5x2.5 (in/in) 1 x Per Week/30 Days ary Discharge Instructions: Apply Hydrofera Blue Ready to wound bed as directed Prim Dressing: Curad Oil Emulsion Dressing 3x3 (in/in) ary 1 x Per Week/30 Days Discharge Instructions: prior to HB Secondary Dressing: Zetuvit Plus 4x8 (in/in) 1 x Per  Week/30 Days Compression Wrap: Urgo K2, two layer compression system, regular 1 x Per Week/30 Days Electronic Signature(s) Signed: 08/08/2023 4:51:37 PM By: [name] MSN RN CNS WTA Signed: 08/08/2023 5:26:58 PM By: Allen Derry PA-C Entered By: [name] on 08/08/2023 08:26:26 -------------------------------------------------------------------------------- Problem List Details Patient Name: Date of Service: [name], [name] 08/08/2023 10:15 [name] M Medical Record Number: [id number] Patient Account Number: [id number] Date of Birth/Sex: Treating RN: 05-19-1946 (77 y.o. [name] Primary Care Provider: [name] Other Clinician: Referring Provider: Treating Provider/Extender: [name] in Treatment: [address] RAMBO, SARAFIAN [name] ([id number]) [id number] Page 7 of 12 ICD-10 Encounter Code Description Active Date MDM Diagnosis I89.0 Lymphedema, not elsewhere classified 05/16/2023 No Yes I87.333 Chronic venous hypertension (idiopathic) with ulcer and inflammation of 05/16/2023 No Yes bilateral lower extremity L97.822 Non-pressure chronic ulcer of other part of left lower leg with fat layer exposed8/05/2023 No Yes I73.89 Other specified peripheral vascular diseases 05/16/2023 No Yes I48.0 Paroxysmal atrial fibrillation 05/16/2023 No Yes I10 Essential (primary) hypertension 05/16/2023 No Yes Inactive Problems Resolved Problems Electronic Signature(s) Signed: 08/08/2023 10:19:18 AM By: Allen Derry PA-C Entered By: Allen Derry on 08/08/2023 07:19:18 -------------------------------------------------------------------------------- Progress Note Details Patient Name: Date of Service: [name], [name] 08/08/2023 10:15 [name] M Medical Record Number: [id number] Patient Account Number: [id number] Date of Birth/Sex: Treating RN: [birth year] (77 y.o. 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 the patient has had [name] vascular workup over 7 years ago and said everything was normal at that stage. He does not have any chronic problems except for cardiac issues which he sees [name] cardiologist in [place]. 08/15/2017 -- arterial and venous duplex studies still pending. 08/23/2017 -- venous reflux studies done on 08/13/2017 shows venous incompetence throughout the left lower extremity deep system and focally at the left [name], [name] ([id number]) [id number] Page 8 of 12 saphenofemoral junction. 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 No evidence of SVT or DVT in bilateral lower extremities The patient has an appointment at the end of the month to get his arterial duplex study done 09/05/2017 -- the patient was seen at the vein and vascular office yesterday by Bary Castilla. ABI studies were notable for medial calcification and the toe brachial indices were normal and bilateral ankle-brachial) waveforms were normal with triphasic flow. After review of his venous studies he was not [name] candidate for laser ablation and his lymphedema was to be treated with compression stockings and lymphedema pump pumps 09/12/2017 -- had [name] low arterial study done at the [facility] -- unable to obtain reliable ABI is due to medial calcification. Bilateral  toe indices were normal with the right being 1.01 and the left being 0.92 and the waveforms were triphasic bilaterally. he did get hold of 30-40 mm compression stockings but is unable to put these on. We will try and get him alternative compression stockings. 09/26/17- he is here in follow up evaluation of [name] right lower extremity ulcer;he is compliant in wearing compression stocking; ulcer almost epithelialized , anticipate healing next appointment Readmission: 11/17 point upon evaluation patient's wound currently that he is seeing [place] for today is [name] skin cancerous lesion that was cleared away by his dermatologist on the left medial calf region. He tells me that this is [name] very similar thing to what he had done previously in fact the last time he saw him in 2018 this was also what was going on at that point. Nonetheless he feels that based on what he seeing currently that this is just having [name] lot of harder time healing although it is much closer to the surface than what he is experienced in the past. He notes that the initial removal was in June 2022 which was this year this is now November and still has not closed. He does have some edema and definitely I think that there is some venous component to his slow healing here. Also think that we can do something better than Vaseline to try to help with getting this to clear up as quickly as possible. He does have [name] history of atrial fibrillation and is on Eliquis otherwise he really has no major medical problems that would affect wound healing. 09/07/2021 upon evaluation today patient actually appears to be doing significantly better after having wrapped him last week. Overall I think that this is making significant improvements at this time which is great  news. I do not see any evidence of infection which is great news as well. No fevers, chills, nausea, vomiting, or diarrhea. 09/14/2021 upon evaluation today patient appears to be doing well with regard to  his leg ulcer. He has been tolerating the dressing changes and overall I think that he is making excellent progress. I do not see any signs of active infection at this time. 09/21/2021 upon evaluation today patient actually appears to be making good progress with regard to his wound this is again measuring smaller today no debridement seems to be necessary. We have been using [name] silver collagen dressing and I think that is doing an awesome job. 09/28/2021 upon evaluation today patient appears to be doing well with regard to his leg currently. I do not see any signs of active infection at this time which is great news. No fevers, chills, nausea, vomiting, or diarrhea. I think this wound is very close to complete resolution. 10/12/2021 upon evaluation today patient actually appears to be doing awesome in regard to his leg ulcer. In fact this appears to be completely healed based on what I am seeing currently. I do not see any evidence of active infection locally nor systemically at this time which is also great news. No fevers, chills, nausea, vomiting, or diarrhea. Readmission: 12/07/2021 upon evaluation today patient presents for readmission here in the clinic. He was discharged on 10/12/2021 is completely healed. Unfortunately this has reopened at this point and he is having continual issues with new blisters over both lower extremities. This is even worse than what we previously saw. Nonetheless we did actually check his ABIs today and it did reveal that his ABIs were 0.55 on the left and 0.57 on the right. Subsequently this is [name] definite change from his last arterial study which showed that he did have good blood flow at 1.01 on the right and 0.92 on the left and that was right at the beginning of 2019. Nonetheless based on what we see currently I do think he tolerated the 3 layer compression wrap but I do believe that we probably need to get him tested for his arterial flow in order to see where things  stand and if there is something we can do there that would help prevent this from continue to be an ongoing issue. He did not utilize compression socks in the interim from when he was last here till this time. That something is probably going to need lifelong going forward as well. 3/9; patient presents for follow-up. He has no issues or complaints today. He tolerated the compression wrap well. He had ABIs with TBI's done. He denies signs of infection. 12/21/2021 upon evaluation today patient appears to be doing well with regard to the wounds on his legs. Both are showing signs of significant improvement which is great news although I do believe some sharp debridement would be of benefit here as well. 12/28/2021 upon evaluation today patient appears to be doing well with regard to his wounds. Everything is showing signs of excellent improvement which I am very pleased about. I think that we are headed in the right direction here. Fortunately there does not appear to be any evidence of infection which is great news there is [name] little bit of hypergranulation. 01/04/2022 upon evaluation today patient appears to be doing well with regard to his wounds 2 of them are healed 1 is almost so and the other 1 is significantly better. Overall I am extremely pleased with where we  stand and I think that he is making excellent progress here. I do not see any evidence of active infection locally nor systemically at this time. 01-16-2022 upon evaluation today patient's wound on the left leg is showing signs of doing quite well. Has not completely cleared at this point but it is much improved. Fortunately I do not see any signs of infection at this time. No fevers, chills, nausea, vomiting, or diarrhea. 01-23-2022 upon evaluation today patient's wound of the left leg actually appears to be pretty much completely healed which is great news. I do not see any signs of active infection locally or systemically which is  excellent. With that being said on the right leg what wound is measuring smaller the other 1 is [name] new wound that just showed up fortunately its not too bad. Has been using Xeroform here and that seems to be doing decently well which is great news. Unfortunately his blood pressure is significantly high we gave him the readings for the past 4-5 visits as well as [name] recommendation to make an appointment to go discuss this with his primary care provider patient states that he is going to look into doing this. 01-30-2022 upon evaluation today patient appears to be doing well with regard to his left leg everything appears to be healed. On the right leg the more anterior wound is healed the more medial wound that I been concerned about [name] possible skin cancer unfortunately still does not look great to me. I do believe that we should probably do [name] biopsy I have talked about it with him [name] few times I think though it is probably time to go ahead and do this at this point. 02-09-2022 upon evaluation today patient appears to be doing well with regard to his legs. On the left this appears to be completely healed. On the right he does have 2 areas and be perfectly honest one of them is [name] skin cancer that he is going to the Mohs surgery clinic for the other seems to be healing nicely. Readmission: 08-02-2022 upon evaluation today patient appears for reevaluation here in our clinic concerning issues that he has been having with wounds over the bilateral lower extremities. I last saw him in May 2023 and at that point we had him completely healed. Unfortunately he is tells me this has broken down to some degree since that point. 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 He tells me this 1 seems to just be getting bigger and  bigger not improving. Fortunately he is not having any significant pain which is good news he does have quite [name] bit of swelling and he tells me that his fluid pills are not recommended for him to take daily but just in 3-day intervals here and there. 08-09-2022 upon evaluation today patient appears to be doing still somewhat poorly in regard to his legs although in general he does not appear to be feeling as [name], [name] ([id number]) [id number] Page 9 of 12 good as he has been. Fortunately there does not appear to be any signs of infection which is good news. With that being said he is having some issues here with having and overall poor feeling in general which again is good I think going to be the  biggest complicating factor. He actually seems to be coughing I do not hear any wheezing right now I did listen to his chest he did not have good airflow down low however makes me suspicious for bronchitis or even possibly pneumonia which could be part of what is going on here as well. Fortunately I do not see any evidence of anything worsening in regard to his legs but I definitely believe that he needs to continue with the compression wraps he took them off yesterday to shower has not had anything on for 24 hours that is why his legs are so swollen today. With regard to his pathology report I did review that it showed some squamous abnormality but no signs of distinct carcinoma. With that being said it was saying that it could be adjacent to [name] squamous cell carcinoma nonetheless my suggestion is can be that we have the patient take copy of this report and give it to his Mohs surgeon in order for them to see if there is anything they feel like needs to be done further. With that being said right now I feel like the primary thing is going to be for [place] to try to get his swelling down and keep that down into that hand since he is having so much drainage I believe we can have to  bring him in for dressing changes twice [name] week doing [name] nurse visit on Mondays. 11/9; since the patient was last here he spent the night in the emergency room he received IV Lasix. Also received antibiotics although he was not discharged on either 1 of these. He also saw his cardiology office who put him on regular Lasix 20 mg [previously on as needed Lasix 20 mg]. Per our intake nurse the swelling in his legs is remarkably better but he still has bilateral lower extremity wounds. He still has wounds on the bilateral lower extremities most problematically on the left lateral calf. He has been using silver alginate under 3 layer compression. 08-23-2022 upon evaluation today patient appears to be doing much better than the last time I saw him 2 weeks ago. At that point I was very concerned about how he was doing he did see Dr. Sherrie Mustache his primary care provider they got him on some blood pressure medication in general his color and overall appearance looks to be doing much improved compared to the last time I saw him. 09-04-2022 upon evaluation today patient appears to be doing well currently in regard to his wounds. Everything is showing signs of improvement which is great news. Fortunately there does not appear to be any signs of active infection locally or systemically at this time. No fevers, chills, nausea, vomiting, or diarrhea. 09-10-2022 upon evaluation today patient appears to be doing better in regard to his wounds although the [facility] was extremely stuck to the wound bed. Fortunately there does not appear to be any signs of infection locally or systemically at this time which is great news. No fevers, chills, nausea, vomiting, or diarrhea. 09-17-2022 upon evaluation today patient appears to be doing well currently in regard to his wounds in general. The right leg actually showing signs of excellent improvement and very pleased with where things stand in that regard. Fortunately I do not  see any evidence of infection locally or systemically at this time which is great news. No fevers, chills, nausea, vomiting, or diarrhea. 09-24-2022 upon evaluation today patient appears to be doing well currently in regard to his wounds.  Things look to be doing quite well. 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 He is seeing his dermatologist tomorrow in that regard. With that being said other than that however he seems to really be making some pretty good progress here which is good news. No fevers, chills, nausea, vomiting, or diarrhea. 12/26; the patient has 2 open wounds remaining on the left leg. One is on the left anterior mid tibia and the other is on the right lateral knee just outside of the popliteal fossa. 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 The patient has been to see dermatology Dr. Adolphus Birchwood who apparently is making him [name] referral to the [facility]. He does not yet have an appointment 10-12-2022 upon evaluation today patient appears to be doing well currently in regard to his wound. He has been tolerating the dressing changes without complication and overall feel like we are headed in the right direction. Fortunately I do not see any signs of infection locally or systemically at this time which is great news. No fevers, chills, nausea, vomiting, or diarrhea. 10-23-2022 upon evaluation today patient appears to be doing well currently in regard to his wound. He has been tolerating the dressing changes without complication. Fortunately there does not appear to be any signs of active infection locally nor systemically which is great news and overall I am extremely pleased with where we stand currently. No fevers, chills, nausea, vomiting, or diarrhea. 10-26-2022 upon evaluation today patient appears to  be doing well currently in regard to his wounds. Everything is showing signs of improvement and this is great news. Fortunately I see no evidence of active infection systemically. He does seem to be doing much better in regard to the local infection in regard to his leg. The smell is also greatly improved. Overall I am extremely happy with where we stand today. This is after just [name] few days with the antibiotic on board. 11-05-2022 upon evaluation today patient appears to be doing well currently in regard to his wounds although the wound where they performed the Mohs surgery does look [name] little bit hyper granulated I think switching to [facility] may be better for him. He voiced understanding. Fortunately there does not appear to be any evidence of active infection locally nor systemically at this time. 11-12-2022 upon evaluation today patient appears to be doing better in regard to both wounds he has been tolerating the dressing changes without complication. There is no signs of infection and in general I think you are doing quite well. No fevers, chills, nausea, vomiting, or diarrhea. 11-20-2022 upon evaluation today patient appears to be doing well currently in regard to his wounds. He has been tolerating the dressing changes without complication. Fortunately there does not appear to be any signs of infection at this time. No fevers, chills, nausea, vomiting, or diarrhea. 11-27-2022 upon evaluation today patient appears to be doing somewhat poorly in regard to his leg in general he has [name] lot of areas where he looks like he had some spots that popped up. There with regard to new possible blisters. In general I am actually very concerned about the fact that the wrap may be causing some irritation here. I think that we can try to not do the wrap for 1 week, and given the prescription for mupirocin  ointment which I will send into the pharmacy for him. 12-04-2022 upon evaluation today patient appears to be  doing well currently in regard to his wounds in fact he appears to be pretty much completely healed based on what I am seeing at this point. I do not see any signs of active infection locally nor systemically at this time which is great news. No fevers, chills, nausea, vomiting, or diarrhea. 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 I discussed that with him today he is aware of the situation.-2024 upon evaluation patient presents for readmission here in the clinic actually last saw him December 04, 2022. With that being said previously his dermatologist had asked that if there was something the need to be addressed from [name] dermatology standpoint and specifically biopsy that we make referral back to them to allow them to do it which I am definitely happy to do. 01-31-2023 upon evaluation today patient appears to be doing well currently in regard to his wound in fact this looks better he did see Dr. Adolphus Birchwood his dermatologist yesterday and they did perform [name] biopsy. With that being said he actually looks like he is doing much better Dr. Adolphus Birchwood feels like this may not be [name] cancerous lesion which will be very good news at the same time I definitely wanted to make sure especially considering his history and the way this looks when we saw him last week. Nonetheless I am extremely pleased with the fact that he is doing so much better at this point. 02-07-2023 upon evaluation today patient appears to be doing well currently in regard to his wound from the standpoint of size it has not gotten any larger. With that being said he does have some issues here still with what appears to be potentially some infection. Wound does not look quite as good as it did last week. We are still waiting on the results for the pathology. I did put [name] call into dermatology but I  have not heard anything back from them as of yet. 02-14-2023 upon evaluation today patient appears to be doing well with regard to his wound infection is definitely under control and looks much better. Fortunately [name], [name] ([id number]) [id number] Page 10 of 12 I do not see any signs of systemic infection with anyone locally I think this is improved greatly. 02-19-2023 upon evaluation today patient appears to be doing well currently in regard to his wound which is actually measuring smaller and looking much better. Fortunately I do not see any evidence of active infection locally nor systemically which is great news and overall I am extremely pleased with where we stand today. 02-26-2023 upon evaluation today patient appears to be doing well currently in regard to his wound. He is actually been tolerating the dressing changes without complication. Fortunately there does not appear to be any signs of active infection locally nor systemically which is great news and overall I am extremely pleased with where we stand currently. 03-12-2023 upon evaluation today patient's wound actually showed signs of excellent improvement. I am very pleased with where we stand I do believe that we are making good progress here. I do not see any signs of active infection. 03-19-2023 upon evaluation today patient actually appears to be making excellent progress in regard to his leg and getting this closed.  In fact is just [name] very small area that is actually open at this point. I am actually very pleased with what we are seeing today. 03-26-2023 upon evaluation today patient appears to be doing well currently in regard to his leg which is actually showing signs of being completely healed. Fortunately I do not see any signs of active infection locally or systemically which is great news and in general I do believe that we are moving in the right direction here. Readmission: 05-16-2023 upon  evaluation today patient presents for reevaluation here in the clinic concerning [name] wound on the left anterior/lateral lower extremity. This is similar to where the wound was last time I saw him but not exactly the same. Fortunately there does not appear to be any signs of active infection at this time which is great news. The patient's past medical history really has not changed significantly since last time I saw him. 05-24-2023 upon evaluation patient actually appears to be doing excellent in regard to his leg ulcer. He has been tolerating the dressing changes without complication in general I do feel like there were making excellent headway towards complete closure. I do not see any signs of active infection at this point. 8/29; left lateral lower leg in the setting of chronic venous insufficiency. We have been using Hydrofera Blue under Urgo K2. Wounds are making nice improvements 06-20-2023 upon evaluation today patient appears to be doing well currently in regard to his leg ulcer. This is actually showing signs of being significantly smaller compared even last week's nurse visit this looks much better. Fortunately I do not see any need for sharp debridement he seems to be doing quite well. 06-27-2023 upon evaluation today patient appears to be doing well currently in regard to his wound. He is tolerating the dressing changes without complication. There is [name] little bit of hypergranulation on the use of silver nitrate today to help keep this under control. Fortunately I do not see any evidence of active infection locally or systemically which is great news. 07-04-2023 upon evaluation today patient appears to be doing well currently in regard to his wound. He has been tolerating the dressing changes without complication. Fortunately I do not see any signs of active infection locally or systemically which is great news and in general I do believe that he is making really good headway towards complete  closure. This looks much better after starting the antibiotics. 07-09-2023 upon evaluation today patient appears to be doing excellent in regard to his leg which is actually significantly improved. I am very pleased with where we stand and I do believe that he is making really good headway towards getting this closed in fact it pretty much appears to be almost closed today there is just [name] very tiny area that I think needs 1 week to toughen up before we cut him loose completely. 07-16-2023 upon evaluation today patient appears to be doing well currently in regard to his wounds. In fact everything is completely healed and looks to be doing great. Fortunately I do not see any signs of active infection at this time which is great news. No fevers, chills, nausea, vomiting, or diarrhea. 07-30-2023 upon evaluation today patient unfortunately has [name] breakdown of the wound on the lateral aspect of his leg. Unfortunately I am [name] little concerned about the overall appearance of the leg I feel like he may require [name] biopsy to further evaluate the situation. I discussed that with the patient today. With that being  said I previously been asked by Dr. Adolphus Birchwood not to perform any of the biopsies myself but to let them see it prior to the biopsy. For that reason I would go ahead and see about making referral to Dr. Durene Cal office to get this scheduled as quickly as possible is my hope. We reached out to them have not heard back as of yet. 08-08-2023 upon evaluation today patient's wound actually showed signs of doing really about the same with regard to his wound. There does not appear to be any signs of active infection at this time which is good news with that being said they did not biopsy this at the office today when he went to dermatology. This is unfortunate as I thought that we can do that now. I will get [name] try to see if we get in touch with Dr. Durene Cal office if were not able to get this done before I see him next  week, going to just do the biopsy myself just to make sure we know what we are dealing with patient voiced understanding he is in agreement with the plan. Objective Constitutional Well-nourished and well-hydrated in no acute distress. Vitals Time Taken: 10:17 AM, Height: 75 in, Weight: 220 lbs, BMI: 27.5, Temperature: 98.0 F, Pulse: 47 bpm, Respiratory Rate: 18 breaths/min, Blood Pressure: 146/65 mmHg. Respiratory normal breathing without difficulty. Psychiatric this patient is able to make decisions and demonstrates good insight into disease process. Alert and Oriented x 3. pleasant and cooperative. General Notes: Upon inspection patient's wound bed actually showed signs of good granulation although it was hypergranulation still the appearance just does not look quite right and again with the immediate reoccurrence and the appearance of this overall I really feel like there may be something abnormal to the tissue in the region. Integumentary (Hair, Skin) [name], [name] ([id number]) [id number] Page 11 of 12 Wound #21R status is Open. Original cause of wound was Gradually Appeared. The date acquired was: 04/08/2023. The wound has been in treatment 12 weeks. The wound is located on the Left,Lateral Lower Leg. The wound measures 3cm length x 2.5cm width x 0.1cm depth; 5.89cm^2 area and 0.589cm^3 volume. There is [name] none present amount of drainage noted. There is large (67-100%) hyper - granulation within the wound bed. There is no necrotic tissue within the wound bed. 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 An CHEM CAUT GRANULATION TISS procedure was performed by Allen Derry, PA-C. Post procedure Diagnosis Wound #21R: Same as Pre-Procedure Plan Follow-up Appointments: Return Appointment in 1 week. Bathing/ Shower/ Hygiene: May shower; gently cleanse wound with antibacterial soap, rinse and pat dry prior to dressing wounds WOUND #21R: - Lower Leg Wound Laterality: Left, Lateral Cleanser: Soap and Water 1 x Per Week/30 Days Discharge Instructions: Gently cleanse wound with antibacterial soap, rinse and pat dry prior to dressing wounds Cleanser: Wound Cleanser 1 x Per Week/30 Days Discharge Instructions: Wash your hands with soap and water. Remove old dressing, discard into plastic bag and place into trash. Cleanse the wound with Wound Cleanser prior to applying [name] clean dressing using gauze sponges, not tissues or cotton balls. Do not scrub or use excessive force.  Pat dry using gauze sponges, not tissue or cotton balls. Peri-Wound Care: AandD Ointment 1 x Per Week/30 Days Discharge Instructions: Apply AandD Ointment as directed Prim Dressing: Hydrofera Blue Ready Transfer Foam, 2.5x2.5 (in/in) 1 x Per Week/30 Days ary Discharge Instructions: Apply Hydrofera Blue Ready to wound bed as directed Prim Dressing: Curad Oil Emulsion Dressing 3x3 (in/in) 1 x Per Week/30 Days ary Discharge Instructions: prior to HB Secondary Dressing: Zetuvit Plus 4x8 (in/in) 1 x Per Week/30 Days Com pression Wrap: Urgo K2, two layer compression system, regular 1 x Per Week/30 Days 1. I believe that we are making good headway towards closure but at the same time I am unsure if this tissue is actually healthy. I do think that we need to have [name] biopsy if they do not do it before at the dermatology office, to do it next week myself. 2. I would recommend we continue with the oral emulsion followed by the [facility] Blue and the Zetuvit to  cover. I am going to recommend that we have the patient continue to monitor for any signs of infection or worsening. Based on what I am seeing I do believe that he is making we will see patient back for reevaluation in 1 week here in the clinic. If anything worsens or changes patient will contact our office for additional recommendations. Electronic Signature(s) Signed: 08/08/2023 3:26:30 PM By: Allen Derry PA-C Entered By: Allen Derry on 08/08/2023 12:26:30 VISHAAL, STROLLO [name] ([id number]) [id number] Page 12 of 12 -------------------------------------------------------------------------------- SuperBill Details Patient Name: Date of Service: [name] 08/08/2023 Medical Record Number: [id number] Patient Account Number: [id number] Date of Birth/Sex: Treating RN: 03/09/1946 (77 y.o. 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 L97.822 Non-pressure chronic ulcer of other part of left lower leg with fat layer exposed I73.89 Other specified peripheral vascular diseases I48.0 Paroxysmal atrial fibrillation I10 Essential (primary) hypertension Facility Procedures : CPT4 Code: [id number] Description: 17250 - CHEM CAUT GRANULATION TISS ICD-10 Diagnosis Description L97.822 Non-pressure chronic ulcer of other part of left lower leg with fat layer expos Modifier: ed Quantity: 1 Physician Procedures : CPT4 Code Description Modifier [id number] 17250 - WC PHYS CHEM CAUT GRAN TISSUE ICD-10 Diagnosis Description L97.822 Non-pressure chronic ulcer of other part of left lower leg with fat layer exposed Quantity: 1 Electronic Signature(s) Signed:  08/08/2023 3:26:47 PM By: Allen Derry PA-C Entered By: Allen Derry on 08/08/2023 12:26:47

## 2023-08-09 NOTE — Progress Notes (Signed)
FINNICK, OROSZ [name] ([id number]) [id number] Page 1 of 6 Visit Report for 08/08/2023 Arrival Information Details Patient Name: Date of Service: [name] [name] 08/08/2023 10:15 [name] M Medical Record Number: [id number] Patient Account Number: [id number] Date of Birth/Sex: Treating RN: [birth year] (77 y.o. [name] Primary Care Makih Stefanko: [name] Other Clinician: Referring Leiani Enright: Treating Darek Eifler/Extender: [name] in Treatment: 12 Visit Information History Since Last Visit Added or deleted any medications: No Patient Arrived: Ambulatory Any new allergies or adverse reactions: No Arrival Time: 10:12 Has Dressing in Place as Prescribed: Yes Accompanied By: self Has Compression in Place as Prescribed: Yes Transfer Assistance: None Pain Present Now: No Patient Identification Verified: Yes Secondary Verification Process Completed: Yes Patient Requires Transmission-Based Precautions: No Patient Has Alerts: No Electronic Signature(s) Signed: 08/08/2023 4:51:37 PM By: [name] MSN RN CNS WTA Entered By: [name] on 08/08/2023 07:12:41 -------------------------------------------------------------------------------- Encounter Discharge Information Details Patient Name: Date of Service: [name], [name] 08/08/2023 10:15 [name] M Medical Record Number: [id number] Patient Account Number: [id number] Date of Birth/Sex: Treating RN: [birth year] (77 y.o. [name] Primary Care Chasidy Janak: [name] Other Clinician: Referring Lavender Stanke: Treating Kyomi Hector/Extender: [name] in Treatment: 12 Encounter Discharge Information Items Discharge Condition: Stable Ambulatory Status: Ambulatory Discharge Destination: Home Transportation: Private Auto Accompanied By: self Schedule Follow-up Appointment: Yes Clinical Summary of Care: Electronic Signature(s) Signed: 08/08/2023 4:51:37 PM By: [name] MSN RN CNS [address] [place] [name] ([id number]) [id number] Page 2 of 6 Entered By: [name] on 08/08/2023 08:34:20 -------------------------------------------------------------------------------- Lower Extremity Assessment Details Patient Name: Date of Service: [name] [name] 08/08/2023 10:15 [name] M Medical Record Number: [id number] Patient Account Number: [id number] Date of Birth/Sex: Treating RN: 1946-10-02 (77 y.o. [name] Primary Care Cristina Ceniceros: [name] Other Clinician: Referring Kenyatta Gloeckner: Treating Manvi Guilliams/Extender: [name] in Treatment: 12 Edema Assessment Assessed: [name]: Yes] Franne Forts: No] Edema: [Left: N] [Right: o] Calf Left: Right: Point of Measurement: 37 cm From Medial Instep 41 cm Ankle Left: Right: Point of Measurement: 12 cm From Medial Instep 24 cm Vascular Assessment Pulses: Dorsalis Pedis Palpable: [Left:Yes] Extremity colors, hair growth, and conditions: Extremity Color: [Left:Normal] Hair Growth on Extremity: [Left:Yes] Temperature of Extremity: [Left:Warm] Capillary Refill: [Left:< 3 seconds] Dependent Rubor: [Left:No] Blanched when Elevated: [Left:No No] Toe Nail Assessment Left: Right: Thick: No Discolored: No Deformed: No Improper Length and Hygiene: No Electronic Signature(s) Signed: 08/08/2023 4:51:37 PM By: [name] MSN RN CNS WTA Entered By: [name] on 08/08/2023 07:25:38 [name] ([id number]) [id number] Page 3 of 6 -------------------------------------------------------------------------------- Multi Wound Chart Details Patient Name: Date of Service: [name] [name] 08/08/2023 10:15 [name] M Medical Record Number: [id number] Patient Account Number: [id number] Date of Birth/Sex: Treating RN: April 07, 1946 (77 y.o. [name] Primary Care Saprina Chuong: [name] Other Clinician: Referring Joni Colegrove: Treating  [name]/Extender: [name] in Treatment: 12 Vital Signs Height(in): 75 Pulse(bpm): 47 Weight(lbs): 220 Blood Pressure(mmHg): 146/65 Body Mass Index(BMI): 27.5 Temperature(F): 98.0 Respiratory Rate(breaths/min): 18 [21R:Photos:] [N/[name]:N/[name]] Left, Lateral Lower Leg N/[name] N/[name] Wound Location: Gradually Appeared N/[name] N/[name] Wounding Event: Vasculitis N/[name] N/[name] Primary Etiology: Arrhythmia, Hypertension, Gout N/[name] N/[name] Comorbid History: 04/08/2023 N/[name] N/[name] Date Acquired: 12 N/[name] N/[name] Weeks of Treatment: Open N/[name] N/[name] Wound Status: No N/[name] N/[name] Wound Recurrence: 3x2.5x0.1 N/[name] N/[name] Measurements L x W x D (cm) 5.89 N/[name] N/[name] [name] (cm) : rea 0.589 N/[name] N/[name] Volume (cm) :  57.10% N/[name] N/[name] % Reduction in [name] rea: 57.10% N/[name] N/[name] % Reduction in Volume: Full Thickness Without Exposed N/[name] N/[name] Classification: Support Structures None Present N/[name] N/[name] Exudate Amount: Large (67-100%) N/[name] N/[name] Granulation Amount: None Present (0%) N/[name] N/[name] Necrotic Amount: Fascia: No N/[name] N/[name] Exposed Structures: Fat Layer (Subcutaneous Tissue): No Tendon: No Muscle: No Joint: No Bone: No Large (67-100%) N/[name] N/[name] Epithelialization: Treatment Notes Electronic Signature(s) Signed: 08/08/2023 4:51:37 PM By: [name] MSN RN CNS WTA Entered By: [name] on 08/08/2023 08:12:54 [name] ([id number]) [id number] Page 4 of 6 -------------------------------------------------------------------------------- Pain Assessment Details Patient Name: Date of Service: [name] [name] 08/08/2023 10:15 [name] M Medical Record Number: [id number] Patient Account Number: [id number] Date of Birth/Sex: Treating RN: 1945/11/03 (77 y.o. [name] Primary Care Sakeenah Valcarcel: [name] Other Clinician: Referring [name]: Treating [name]/Extender: [name] in Treatment: 12 Active Problems Location of Pain Severity and Description of Pain Patient Has  Paino No Site Locations Pain Management and Medication Current Pain Management: Electronic Signature(s) Signed: 08/08/2023 4:51:37 PM By: [name] MSN RN CNS WTA Entered By: [name] on 08/08/2023 07:18:53 -------------------------------------------------------------------------------- Wound Assessment Details Patient Name: Date of Service: [name], [name] 08/08/2023 10:15 [name] M Medical Record Number: [id number] Patient Account Number: [id number] Date of Birth/Sex: Treating RN: [birth year] (77 y.o. [name] Primary Care Oris Calmes: [name] Other Clinician: Referring [name]: Treating [name]/Extender: [name] in Treatment: [address] [place] [name] [id number] Page 5 of 6 Wound Status Wound Number: [id number] Primary Etiology: Vasculitis Wound Location: Left, Lateral Lower Leg Wound Status: Open Wounding Event: Gradually Appeared Comorbid History: Arrhythmia, Hypertension, Gout Date Acquired: 04/08/2023 Weeks Of Treatment: 12 Clustered Wound: No Photos Wound Measurements Length: (cm) 3 Width: (cm) 2.5 Depth: (cm) 0.1 Area: (cm) 5.89 Volume: (cm) 0.589 % Reduction in Area: 57.1% % Reduction in Volume: 57.1% Epithelialization: Large (67-100%) Wound Description Classification: Full Thickness Without Exposed Support Structures Exudate Amount: None Present Foul Odor After Cleansing: No Slough/Fibrino No Wound Bed Granulation Amount: Large (67-100%) Exposed Structure Granulation Quality: Hyper-granulation Fascia Exposed: No Necrotic Amount: None Present (0%) Fat Layer (Subcutaneous Tissue) Exposed: No Tendon Exposed: No Muscle Exposed: No Joint Exposed: No Bone Exposed: No Treatment Notes Wound #21R (Lower Leg) Wound Laterality: Left, Lateral Cleanser Soap and Water Discharge Instruction: Gently cleanse wound with antibacterial soap, rinse and pat dry prior to dressing wounds Wound  Cleanser Discharge Instruction: Wash your hands with soap and water. Remove old dressing, discard into plastic bag and place into trash. Cleanse the wound with Wound Cleanser prior to applying [name] clean dressing using gauze sponges, not tissues or cotton balls. Do not scrub or use excessive force. Pat dry using gauze sponges, not tissue or cotton balls. Peri-Wound Care AandD Ointment Discharge Instruction: Apply AandD Ointment as directed Topical Primary Dressing Hydrofera Blue Ready Transfer Foam, 2.5x2.5 (in/in) Discharge Instruction: Apply Hydrofera Blue Ready to wound bed as directed Curad Oil Emulsion Dressing 3x3 (in/in) Discharge Instruction: prior to HB Secondary Dressing Zetuvit Plus 4x8 (in/in) Secured With Compression Wrap Urgo K2, two layer compression system, regular [name], [name] ([id number]) [phone].pdf Page 6 of 6 Compression Stockings Add-Ons Electronic Signature(s) Signed: 08/08/2023 4:51:37 PM By: [name] MSN RN CNS WTA Entered By: [name] on 08/08/2023 07:24:49 -------------------------------------------------------------------------------- Vitals Details Patient Name: Date of Service: [name], [name] 08/08/2023 10:15 [name] M Medical Record Number: [id number] Patient Account Number: [id number] Date of Birth/Sex:  Treating RN: Oct 05, 1946 (77 y.o. [name] Primary Care Daegon Deiss: [name] Other Clinician: Referring Kaulana Brindle: Treating Nakaiya Beddow/Extender: [name] in Treatment: 12 Vital Signs Time Taken: 10:17 Temperature (F): 98.0 Height (in): 75 Pulse (bpm): 47 Weight (lbs): 220 Respiratory Rate (breaths/min): 18 Body Mass Index (BMI): 27.5 Blood Pressure (mmHg): 146/65 Reference Range: 80 - 120 mg / dl Electronic Signature(s) Signed: 08/08/2023 4:51:37 PM By: [name] MSN RN CNS WTA Entered By: [name] on 08/08/2023 07:18:47

## 2023-08-15 ENCOUNTER — Encounter: Payer: Medicare PPO | Attending: Physician Assistant | Admitting: Physician Assistant

## 2023-08-15 DIAGNOSIS — I87333 Chronic venous hypertension (idiopathic) with ulcer and inflammation of bilateral lower extremity: Secondary | ICD-10-CM | POA: Diagnosis not present

## 2023-08-15 DIAGNOSIS — Z85828 Personal history of other malignant neoplasm of skin: Secondary | ICD-10-CM | POA: Diagnosis not present

## 2023-08-15 DIAGNOSIS — I7389 Other specified peripheral vascular diseases: Secondary | ICD-10-CM | POA: Insufficient documentation

## 2023-08-15 DIAGNOSIS — Z7901 Long term (current) use of anticoagulants: Secondary | ICD-10-CM | POA: Diagnosis not present

## 2023-08-15 DIAGNOSIS — I482 Chronic atrial fibrillation, unspecified: Secondary | ICD-10-CM | POA: Insufficient documentation

## 2023-08-15 DIAGNOSIS — I10 Essential (primary) hypertension: Secondary | ICD-10-CM | POA: Insufficient documentation

## 2023-08-15 DIAGNOSIS — L97821 Non-pressure chronic ulcer of other part of left lower leg limited to breakdown of skin: Secondary | ICD-10-CM | POA: Diagnosis not present

## 2023-08-15 DIAGNOSIS — I890 Lymphedema, not elsewhere classified: Secondary | ICD-10-CM | POA: Insufficient documentation

## 2023-08-15 DIAGNOSIS — I480 Paroxysmal atrial fibrillation: Secondary | ICD-10-CM | POA: Diagnosis not present

## 2023-08-15 DIAGNOSIS — L97822 Non-pressure chronic ulcer of other part of left lower leg with fat layer exposed: Secondary | ICD-10-CM | POA: Diagnosis not present

## 2023-08-15 DIAGNOSIS — I872 Venous insufficiency (chronic) (peripheral): Secondary | ICD-10-CM | POA: Diagnosis not present

## 2023-08-15 NOTE — Progress Notes (Addendum)
[name] ([id number]) [id number] Page 1 of 12 Visit Report for 08/15/2023 Chief Complaint Document Details Patient Name: Date of Service: [name] 08/15/2023 3:30 PM Medical Record Number: [id number] Patient Account Number: [id number] Date of Birth/Sex: Treating RN: 1946-05-19 (77 y.o. [name] Primary Care Provider: [name] Other Clinician: Referring Provider: Treating Provider/Extender: [name] in Treatment: 13 Information Obtained from: Patient Chief Complaint Left LE Ulcer Electronic Signature(s) Signed: 08/15/2023 3:28:44 PM By: Allen Derry PA-C Entered By: Allen Derry on 08/15/2023 12:28:44 -------------------------------------------------------------------------------- HPI Details Patient Name: Date of Service: [name] [name] N, [name] 08/15/2023 3:30 PM Medical Record Number: [id number] Patient Account Number: [id number] Date of Birth/Sex: Treating RN: 01-03-1946 (77 y.o. [name] Primary Care Provider: [name] Other Clinician: Referring Provider: Treating Provider/Extender: [name] in Treatment: 13 History of Present Illness HPI Description: 76 year old male who has a past medical history of essential hypertension, chronic atrial fibrillation, peripheral vascular disease, nonischemic cardiomyopathy,venous stasis dermatitis, gouty arthropathy, basal cell carcinoma of the right lower extremity, benign prostatic hypertrophy, long- term use of anticoagulation therapy, hyperglycemia and exercise intolerance has never been a smoker. the patient has had a vascular workup over 7 years ago and said everything was normal at that stage. He does not have any chronic problems except for cardiac issues which he sees a cardiologist in [place]. 08/15/2017 -- arterial and venous duplex studies still pending. 08/23/2017 -- venous reflux studies done on 08/13/2017 shows  venous incompetence throughout the left lower extremity deep system and focally at the left saphenofemoral junction. 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 No evidence of SVT or DVT in bilateral lower extremities The patient has an appointment at the end of the month to get his arterial duplex study done 09/05/2017 -- the patient was seen at the vein and vascular office yesterday by Bary Castilla. ABI studies were notable for medial calcification and the toe brachial indices were normal and bilateral ankle-brachial) waveforms were normal with triphasic flow. After review of his venous studies he was not a candidate for laser ablation and his lymphedema was to be treated with compression stockings and lymphedema pump pumps 09/12/2017 -- had a low arterial study done at the [facility] -- unable to obtain reliable ABI is due to medial calcification. Bilateral toe [name] ([id number]) [id number] Page 2 of 12 indices were normal with the right being 1.01 and the left being 0.92 and the waveforms were triphasic bilaterally. he did get hold of 30-40 mm compression stockings but is unable to put these on. We will try and get him alternative compression stockings. 09/26/17- he is here in follow up evaluation of a right lower extremity ulcer;he is compliant in wearing compression stocking; ulcer almost epithelialized , anticipate healing next appointment Readmission: 11/17 point upon evaluation patient's wound currently that he is seeing [place] for today is a skin cancerous lesion that was cleared away by his dermatologist on the left medial calf region. He tells me that this is a very similar thing to what he had done previously in fact the last time he saw him in 2018 this was also what was going on at that point. Nonetheless he feels that based on what he seeing currently that this is just having a lot of harder time healing  although it is much closer to the surface than what he is experienced in the past. He notes  that the initial removal was in June 2022 which was this year this is now November and still has not closed. He does have some edema and definitely I think that there is some venous component to his slow healing here. Also think that we can do something better than Vaseline to try to help with getting this to clear up as quickly as possible. He does have a history of atrial fibrillation and is on Eliquis otherwise he really has no major medical problems that would affect wound healing. 09/07/2021 upon evaluation today patient actually appears to be doing significantly better after having wrapped him last week. Overall I think that this is making significant improvements at this time which is great news. I do not see any evidence of infection which is great news as well. No fevers, chills, nausea, vomiting, or diarrhea. 09/14/2021 upon evaluation today patient appears to be doing well with regard to his leg ulcer. He has been tolerating the dressing changes and overall I think that he is making excellent progress. I do not see any signs of active infection at this time. 09/21/2021 upon evaluation today patient actually appears to be making good progress with regard to his wound this is again measuring smaller today no debridement seems to be necessary. We have been using a silver collagen dressing and I think that is doing an awesome job. 09/28/2021 upon evaluation today patient appears to be doing well with regard to his leg currently. I do not see any signs of active infection at this time which is great news. No fevers, chills, nausea, vomiting, or diarrhea. I think this wound is very close to complete resolution. 10/12/2021 upon evaluation today patient actually appears to be doing awesome in regard to his leg ulcer. In fact this appears to be completely healed based on what I am seeing currently. I do not see  any evidence of active infection locally nor systemically at this time which is also great news. No fevers, chills, nausea, vomiting, or diarrhea. Readmission: 12/07/2021 upon evaluation today patient presents for readmission here in the clinic. He was discharged on 10/12/2021 is completely healed. Unfortunately this has reopened at this point and he is having continual issues with new blisters over both lower extremities. This is even worse than what we previously saw. Nonetheless we did actually check his ABIs today and it did reveal that his ABIs were 0.55 on the left and 0.57 on the right. Subsequently this is a definite change from his last arterial study which showed that he did have good blood flow at 1.01 on the right and 0.92 on the left and that was right at the beginning of 2019. Nonetheless based on what we see currently I do think he tolerated the 3 layer compression wrap but I do believe that we probably need to get him tested for his arterial flow in order to see where things stand and if there is something we can do there that would help prevent this from continue to be an ongoing issue. He did not utilize compression socks in the interim from when he was last here till this time. That something is probably going to need lifelong going forward as well. 3/9; patient presents for follow-up. He has no issues or complaints today. He tolerated the compression wrap well. He had ABIs with TBI's done. He denies signs of infection. 12/21/2021 upon evaluation today patient appears to be doing well with regard to the wounds on his legs. Both are showing  signs of significant improvement which is great news although I do believe some sharp debridement would be of benefit here as well. 12/28/2021 upon evaluation today patient appears to be doing well with regard to his wounds. Everything is showing signs of excellent improvement which I am very pleased about. I think that we are headed in the right  direction here. Fortunately there does not appear to be any evidence of infection which is great news there is a little bit of hypergranulation. 01/04/2022 upon evaluation today patient appears to be doing well with regard to his wounds 2 of them are healed 1 is almost so and the other 1 is significantly better. Overall I am extremely pleased with where we stand and I think that he is making excellent progress here. I do not see any evidence of active infection locally nor systemically at this time. 01-16-2022 upon evaluation today patient's wound on the left leg is showing signs of doing quite well. Has not completely cleared at this point but it is much improved. Fortunately I do not see any signs of infection at this time. No fevers, chills, nausea, vomiting, or diarrhea. 01-23-2022 upon evaluation today patient's wound of the left leg actually appears to be pretty much completely healed which is great news. I do not see any signs of active infection locally or systemically which is excellent. With that being said on the right leg what wound is measuring smaller the other 1 is a new wound that just showed up fortunately its not too bad. Has been using Xeroform here and that seems to be doing decently well which is great news. Unfortunately his blood pressure is significantly high we gave him the readings for the past 4-5 visits as well as a recommendation to make an appointment to go discuss this with his primary care provider patient states that he is going to look into doing this. 01-30-2022 upon evaluation today patient appears to be doing well with regard to his left leg everything appears to be healed. On the right leg the more anterior wound is healed the more medial wound that I been concerned about a possible skin cancer unfortunately still does not look great to me. I do believe that we should probably do a biopsy I have talked about it with him a few times I think though it is probably time to  go ahead and do this at this point. 02-09-2022 upon evaluation today patient appears to be doing well with regard to his legs. On the left this appears to be completely healed. On the right he does have 2 areas and be perfectly honest one of them is a skin cancer that he is going to the Mohs surgery clinic for the other seems to be healing nicely. Readmission: 08-02-2022 upon evaluation today patient appears for reevaluation here in our clinic concerning issues that he has been having with wounds over the bilateral lower extremities. I last saw him in May 2023 and at that point we had him completely healed. Unfortunately he is tells me this has broken down to some degree since that point. 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 He tells me this 1 seems to just be getting bigger and bigger not improving. Fortunately he is not having any significant  pain which is good news he does have quite a bit of swelling and he tells me that his fluid pills are not recommended for him to take daily but just in 3-day intervals here and there. 08-09-2022 upon evaluation today patient appears to be doing still somewhat poorly in regard to his legs although in general he does not appear to be feeling as good as he has been. Fortunately there does not appear to be any signs of infection which is good news. With that being said he is having some issues here with having and overall poor feeling in general which again is good I think going to be the biggest complicating factor. He actually seems to be coughing I do not hear any wheezing right now I did listen to his chest he did not have good airflow down low however makes me suspicious for bronchitis or even possibly pneumonia which could be part of what is going on here as  well. Fortunately I do not see any evidence of anything worsening in regard to his legs but I definitely believe that he needs to continue with the compression wraps he took them off yesterday to shower has not had anything on for 24 hours that is why his legs are so swollen today. With regard to his pathology report I did review that it showed some squamous abnormality but no signs of distinct carcinoma. With that being said it was MAREK, NABORS ([id number]) [id number] Page 3 of 12 saying that it could be adjacent to a squamous cell carcinoma nonetheless my suggestion is can be that we have the patient take copy of this report and give it to his Mohs surgeon in order for them to see if there is anything they feel like needs to be done further. With that being said right now I feel like the primary thing is going to be for [place] to try to get his swelling down and keep that down into that hand since he is having so much drainage I believe we can have to bring him in for dressing changes twice a week doing a nurse visit on Mondays. 11/9; since the patient was last here he spent the night in the emergency room he received IV Lasix. Also received antibiotics although he was not discharged on either 1 of these. He also saw his cardiology office who put him on regular Lasix 20 mg [previously on as needed Lasix 20 mg]. Per our intake nurse the swelling in his legs is remarkably better but he still has bilateral lower extremity wounds. He still has wounds on the bilateral lower extremities most problematically on the left lateral calf. He has been using silver alginate under 3 layer compression. 08-23-2022 upon evaluation today patient appears to be doing much better than the last time I saw him 2 weeks ago. At that point I was very concerned about how he was doing he did see Dr. Sherrie Mustache his primary care provider they got him on some blood pressure medication in general his color  and overall appearance looks to be doing much improved compared to the last time I saw him. 09-04-2022 upon evaluation today patient appears to be doing well currently in regard to his wounds. Everything is showing signs of improvement which is great news. Fortunately there does not appear to be any signs of active infection locally or systemically at this time. No fevers, chills, nausea, vomiting, or diarrhea. 09-10-2022 upon evaluation today patient appears to be doing better in  regard to his wounds although the [facility] was extremely stuck to the wound bed. Fortunately there does not appear to be any signs of infection locally or systemically at this time which is great news. No fevers, chills, nausea, vomiting, or diarrhea. 09-17-2022 upon evaluation today patient appears to be doing well currently in regard to his wounds in general. The right leg actually showing signs of excellent improvement and very pleased with where things stand in that regard. Fortunately I do not see any evidence of infection locally or systemically at this time which is great news. No fevers, chills, nausea, vomiting, or diarrhea. 09-24-2022 upon evaluation today patient appears to be doing well currently in regard to his wounds. Things look to be doing quite well. With that being said he did have a result unfortunately on the pathology which showed that he did have a squamous cell carcinoma noted on the biopsy sample I sent last week. He is seeing his dermatologist tomorrow in that regard. With that being said other than that however he seems to really be making some pretty good progress here which is good news. No fevers, chills, nausea, vomiting, or diarrhea. 12/26; the patient has 2 open wounds remaining on the left leg. One is on the left anterior mid tibia and the other is on the right lateral knee just outside of the popliteal fossa. The latter wound apparently has been biopsied showing squamous cell  carcinoma. The patient has been to see dermatology Dr. Adolphus Birchwood who apparently is making him a referral to the [facility] Mohs surgery center. He does not yet have an appointment 10-12-2022 upon evaluation today patient appears to be doing well currently in regard to his wound. He has been tolerating the dressing changes without complication and overall feel like we are headed in the right direction. Fortunately I do not see any signs of infection locally or systemically at this time which is great news. No fevers, chills, nausea, vomiting, or diarrhea. 10-23-2022 upon evaluation today patient appears to be doing well currently in regard to his wound. He has been tolerating the dressing changes without complication. Fortunately there does not appear to be any signs of active infection locally nor systemically which is great news and overall I am extremely pleased with where we stand currently. No fevers, chills, nausea, vomiting, or diarrhea. 10-26-2022 upon evaluation today patient appears to be doing well currently in regard to his wounds. Everything is showing signs of improvement and this is great news. Fortunately I see no evidence of active infection systemically. He does seem to be doing much better in regard to the local infection in regard to his leg. The smell is also greatly improved. Overall I am extremely happy with where we stand today. This is after just a few days with the antibiotic on board. 11-05-2022 upon evaluation today patient appears to be doing well currently in regard to his wounds although the wound where they performed the Mohs surgery does look a little bit hyper granulated I think switching to [facility] may be better for him. He voiced understanding. Fortunately there does not appear to be any evidence of active infection locally nor systemically at this time. 11-12-2022 upon evaluation today patient appears to be doing better in regard to both wounds he has been tolerating  the dressing changes without complication. There is no signs of infection and in general I think you are doing quite well. No fevers, chills, nausea, vomiting, or diarrhea. 11-20-2022 upon evaluation today  patient appears to be doing well currently in regard to his wounds. He has been tolerating the dressing changes without complication. Fortunately there does not appear to be any signs of infection at this time. No fevers, chills, nausea, vomiting, or diarrhea. 11-27-2022 upon evaluation today patient appears to be doing somewhat poorly in regard to his leg in general he has a lot of areas where he looks like he had some spots that popped up. There with regard to new possible blisters. In general I am actually very concerned about the fact that the wrap may be causing some irritation here. I think that we can try to not do the wrap for 1 week, and given the prescription for mupirocin ointment which I will send into the pharmacy for him. 12-04-2022 upon evaluation today patient appears to be doing well currently in regard to his wounds in fact he appears to be pretty much completely healed based on what I am seeing at this point. I do not see any signs of active infection locally nor systemically at this time which is great news. No fevers, chills, nausea, vomiting, or diarrhea. 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 I discussed that with him today he is aware of the situation.-2024 upon evaluation patient presents for readmission here in the clinic actually last saw him December 04, 2022. With that being said previously his dermatologist had asked that if there was something the need to be addressed from a dermatology standpoint and specifically biopsy that we make referral back to them to allow them to do it which I am definitely happy to  do. 01-31-2023 upon evaluation today patient appears to be doing well currently in regard to his wound in fact this looks better he did see Dr. Adolphus Birchwood his dermatologist yesterday and they did perform a biopsy. With that being said he actually looks like he is doing much better Dr. Adolphus Birchwood feels like this may not be a cancerous lesion which will be very good news at the same time I definitely wanted to make sure especially considering his history and the way this looks when we saw him last week. Nonetheless I am extremely pleased with the fact that he is doing so much better at this point. 02-07-2023 upon evaluation today patient appears to be doing well currently in regard to his wound from the standpoint of size it has not gotten any larger. With that being said he does have some issues here still with what appears to be potentially some infection. Wound does not look quite as good as it did last week. We are still waiting on the results for the pathology. I did put a call into dermatology but I have not heard anything back from them as of yet. 02-14-2023 upon evaluation today patient appears to be doing well with regard to his wound infection is definitely under control and looks much better. Fortunately I do not see any signs of systemic infection with anyone locally I think this is improved greatly. 02-19-2023 upon evaluation today patient appears to be doing well currently in regard to his wound which is actually measuring smaller and looking much better. Fortunately I do not see any evidence of active infection locally nor systemically which is great news and overall I am extremely pleased with where we stand today. 02-26-2023 upon evaluation  today patient appears to be doing well currently in regard to his wound. He is actually been tolerating the dressing changes without complication. Fortunately there does not appear to be any signs of active infection locally nor systemically which is great news and  overall I am extremely CHRISTOS, LAPRISE ([id number]) [id number] Page 4 of 12 pleased with where we stand currently. 03-12-2023 upon evaluation today patient's wound actually showed signs of excellent improvement. I am very pleased with where we stand I do believe that we are making good progress here. I do not see any signs of active infection. 03-19-2023 upon evaluation today patient actually appears to be making excellent progress in regard to his leg and getting this closed. In fact is just a very small area that is actually open at this point. I am actually very pleased with what we are seeing today. 03-26-2023 upon evaluation today patient appears to be doing well currently in regard to his leg which is actually showing signs of being completely healed. Fortunately I do not see any signs of active infection locally or systemically which is great news and in general I do believe that we are moving in the right direction here. Readmission: 05-16-2023 upon evaluation today patient presents for reevaluation here in the clinic concerning a wound on the left anterior/lateral lower extremity. This is similar to where the wound was last time I saw him but not exactly the same. Fortunately there does not appear to be any signs of active infection at this time which is great news. The patient's past medical history really has not changed significantly since last time I saw him. 05-24-2023 upon evaluation patient actually appears to be doing excellent in regard to his leg ulcer. He has been tolerating the dressing changes without complication in general I do feel like there were making excellent headway towards complete closure. I do not see any signs of active infection at this point. 8/29; left lateral lower leg in the setting of chronic venous insufficiency. We have been using Hydrofera Blue under Urgo K2. Wounds are making nice improvements 06-20-2023 upon evaluation today patient  appears to be doing well currently in regard to his leg ulcer. This is actually showing signs of being significantly smaller compared even last week's nurse visit this looks much better. Fortunately I do not see any need for sharp debridement he seems to be doing quite well. 06-27-2023 upon evaluation today patient appears to be doing well currently in regard to his wound. He is tolerating the dressing changes without complication. There is a little bit of hypergranulation on the use of silver nitrate today to help keep this under control. Fortunately I do not see any evidence of active infection locally or systemically which is great news. 07-04-2023 upon evaluation today patient appears to be doing well currently in regard to his wound. He has been tolerating the dressing changes without complication. Fortunately I do not see any signs of active infection locally or systemically which is great news and in general I do believe that he is making really good headway towards complete closure. This looks much better after starting the antibiotics. 07-09-2023 upon evaluation today patient appears to be doing excellent in regard to his leg which is actually significantly improved. I am very pleased with where we stand and I do believe that he is making really good headway towards getting this closed in fact it pretty much appears to be almost closed today there is just a very tiny area  that I think needs 1 week to toughen up before we cut him loose completely. 07-16-2023 upon evaluation today patient appears to be doing well currently in regard to his wounds. In fact everything is completely healed and looks to be doing great. Fortunately I do not see any signs of active infection at this time which is great news. No fevers, chills, nausea, vomiting, or diarrhea. 07-30-2023 upon evaluation today patient unfortunately has a breakdown of the wound on the lateral aspect of his leg. Unfortunately I am a little  concerned about the overall appearance of the leg I feel like he may require a biopsy to further evaluate the situation. I discussed that with the patient today. With that being said I previously been asked by Dr. Adolphus Birchwood not to perform any of the biopsies myself but to let them see it prior to the biopsy. For that reason I would go ahead and see about making referral to Dr. Durene Cal office to get this scheduled as quickly as possible is my hope. We reached out to them have not heard back as of yet. 08-08-2023 upon evaluation today patient's wound actually showed signs of doing really about the same with regard to his wound. There does not appear to be any signs of active infection at this time which is good news with that being said they did not biopsy this at the office today when he went to dermatology. This is unfortunate as I thought that we can do that now. I will get a try to see if we get in touch with Dr. Durene Cal office if were not able to get this done before I see him next week, going to just do the biopsy myself just to make sure we know what we are dealing with patient voiced understanding he is in agreement with the plan. 08-15-2023 upon evaluation today patient appears to be doing pretty well currently in regard to his wound which is okay he did have a biopsy today apparently they had a hard time getting it to stop bleeding that had a pressure dressing on pretty tightly the good news is hemostasis has been achieved. With that being said the patient tells me that he unfortunately when they unwrapped him had several areas that were somewhat pussy and opened in regard to his leg I think he may have a bit of an infection ensuing again already. Electronic Signature(s) Signed: 08/15/2023 3:58:25 PM By: Allen Derry PA-C Entered By: Allen Derry on 08/15/2023 12:58:25 -------------------------------------------------------------------------------- Physical Exam Details Patient Name: Date of  Service: [name] [name] N, [name] 08/15/2023 3:30 PM Medical Record Number: [id number] Patient Account Number: [id number] Date of Birth/Sex: Treating RN: 11-05-1945 (77 y.o. [name] Primary Care Provider: [name] Other Clinician: Referring Provider: Treating Provider/Extender: [name] in Treatment: [address] [name] ([id number]) [phone].pdf Page 5 of 12 Constitutional Well-nourished and well-hydrated in no acute distress. Respiratory normal breathing without difficulty. Psychiatric this patient is able to make decisions and demonstrates good insight into disease process. Alert and Oriented x 3. pleasant and cooperative. Notes Upon inspection patient's wound bed again showed signs of doing about the same with using the oil emulsion followed by the Hydrofera Blue at this point which has done pretty well for him all things considered he has had a biopsy today will be to see what the results of that show. That was done at dermatology with Dr. Durene Cal office. With that being said with the fact that he is  having a lot of the pustules and openings over his leg in general I think he is probably having another issue with a staph breakdown I think that we should go ahead and see about getting him an antibiotic sent back in which seems to typically get this under control for him. Electronic Signature(s) Signed: 08/15/2023 3:59:10 PM By: Allen Derry PA-C Entered By: Allen Derry on 08/15/2023 12:59:10 -------------------------------------------------------------------------------- Physician Orders Details Patient Name: Date of Service: [name] [name] N, [name] 08/15/2023 3:30 PM Medical Record Number: [id number] Patient Account Number: [id number] Date of Birth/Sex: Treating RN: [birth year] (77 y.o. 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 Bathing/ Shower/ Hygiene May shower; gently cleanse wound with antibacterial soap, rinse and pat dry prior to dressing wounds Wound Treatment Wound #21R - Lower Leg Wound Laterality: Left, Lateral Cleanser: Soap and Water 1 x Per Week/30 Days Discharge Instructions: Gently cleanse wound with antibacterial soap, rinse and pat dry prior to dressing wounds Cleanser: Wound Cleanser 1 x Per Week/30 Days Discharge Instructions: Wash your hands with soap and water. Remove old dressing, discard into plastic bag and place into trash. Cleanse the wound with Wound Cleanser prior to applying a clean dressing using gauze sponges, not tissues or cotton balls. Do not scrub or use excessive force. Pat dry using gauze sponges, not tissue or cotton balls. Peri-Wound Care: AandD Ointment 1 x Per Week/30 Days STEED, BABBIT ([id number]) [id number] Page 6 of 12 Discharge Instructions: Apply AandD Ointment as directed Prim Dressing: Hydrofera Blue Ready Transfer Foam, 2.5x2.5 (in/in) 1 x Per Week/30 Days ary Discharge Instructions: Apply Hydrofera Blue Ready to wound bed as directed Prim Dressing: Curad Oil Emulsion Dressing 3x3 (in/in) ary 1 x Per Week/30 Days Discharge Instructions: prior to HB Secondary Dressing: Zetuvit Plus 4x8 (in/in) 1 x Per Week/30  Days Compression Wrap: Urgo K2, two layer compression system, regular 1 x Per Week/30 Days Patient Medications llergies: No Known Allergies A Notifications Medication Indication Start End 08/15/2023 Bactrim DS DOSE 1 - oral 800 mg-160 mg tablet - 1 tablet oral twice a day x 14 days. Do not take potassium while on this medication Electronic Signature(s) Signed: 08/15/2023 4:21:42 PM By: [name] MSN RN CNS WTA Signed: 08/19/2023 4:31:01 PM By: Allen Derry PA-C Previous Signature: 08/15/2023 4:01:26 PM Version By: Allen Derry PA-C Entered By: [name] on 08/15/2023 13:21:42 -------------------------------------------------------------------------------- Problem List Details Patient Name: Date of Service: [name] [name] N, [name] 08/15/2023 3:30 PM Medical Record Number: [id number] Patient Account Number: [id number] Date of Birth/Sex: Treating RN: September 16, 1946 (77 y.o. [name] Primary Care Provider: [name] Other Clinician: Referring Provider: Treating Provider/Extender: [name] in Treatment: 13 Active Problems ICD-10 Encounter Code Description Active Date MDM Diagnosis I89.0 Lymphedema, not elsewhere classified 05/16/2023 No Yes I87.333 Chronic venous hypertension (idiopathic) with ulcer and inflammation of 05/16/2023 No Yes bilateral lower extremity L97.822 Non-pressure chronic ulcer of other part of left  lower leg with fat layer exposed8/05/2023 No Yes I73.89 Other specified peripheral vascular diseases 05/16/2023 No Yes I48.0 Paroxysmal atrial fibrillation 05/16/2023 No Yes JENRY, MCABEE [name] ([id number]) [id number] Page 7 of 12 I10 Essential (primary) hypertension 05/16/2023 No Yes Inactive Problems Resolved Problems Electronic Signature(s) Signed: 08/15/2023 3:28:33 PM By: Allen Derry PA-C Entered By: Allen Derry on 08/15/2023  12:28:32 -------------------------------------------------------------------------------- Progress Note Details Patient Name: Date of Service: [name] [name] N, [name] 08/15/2023 3:30 PM Medical Record Number: [id number] Patient Account Number: [id number] Date of Birth/Sex: Treating RN: 05-10-1946 (77 y.o. 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 the patient has had a vascular workup over 7 years ago and said everything was normal at that stage. He does not have any chronic problems except for cardiac issues which he sees a cardiologist in [place]. 08/15/2017 -- arterial and venous duplex studies still pending. 08/23/2017 -- venous reflux studies done on 08/13/2017 shows venous incompetence throughout the left lower extremity deep system and focally at the left saphenofemoral junction. 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 No evidence of SVT or DVT in bilateral lower extremities The patient has an appointment at the end of the month to get his arterial duplex study done 09/05/2017 -- the patient was seen at the vein and vascular office yesterday by Bary Castilla. ABI studies were notable for medial calcification and the toe brachial indices were normal and bilateral ankle-brachial) waveforms were normal with triphasic flow. After review of his venous  studies he was not a candidate for laser ablation and his lymphedema was to be treated with compression stockings and lymphedema pump pumps 09/12/2017 -- had a low arterial study done at the [facility] -- unable to obtain reliable ABI is due to medial calcification. Bilateral toe indices were normal with the right being 1.01 and the left being 0.92 and the waveforms were triphasic bilaterally. he did get hold of 30-40 mm compression stockings but is unable to put these on. We will try and get him alternative compression stockings. 09/26/17- he is here in follow up evaluation of a right lower extremity ulcer;he is compliant in wearing compression stocking; ulcer almost epithelialized , anticipate healing next appointment Readmission: 11/17 point upon evaluation patient's wound currently that he is seeing [place] for today is a skin cancerous lesion that was cleared away by his dermatologist on the left medial calf region. He tells me that this is a very similar thing to what he had done previously in fact the last time he saw him in 2018 this was also what was going on at that point. Nonetheless he feels that based on what he seeing currently that this is just having a lot of harder time healing although it is much closer to the surface than what he is experienced in the past. He notes that the initial removal was in June 2022 which was this year this is now November and still has not closed. He does have some edema and definitely I think that there is some  venous component to his slow healing here. Also think that we can do something better than Vaseline to try to help with getting this to clear up as quickly as possible. He does have a history of atrial fibrillation and is on Eliquis otherwise he really has no major medical problems that would affect wound healing. 09/07/2021 upon evaluation today patient actually appears to be doing significantly better after having wrapped him  last week. Overall I think that this is making significant improvements at this time which is great news. I do not see any evidence of infection which is great news as well. No fevers, chills, nausea, vomiting, or diarrhea. STEWART, BRUZEK [name] ([id number]) [id number] Page 8 of 12 09/14/2021 upon evaluation today patient appears to be doing well with regard to his leg ulcer. He has been tolerating the dressing changes and overall I think that he is making excellent progress. I do not see any signs of active infection at this time. 09/21/2021 upon evaluation today patient actually appears to be making good progress with regard to his wound this is again measuring smaller today no debridement seems to be necessary. We have been using a silver collagen dressing and I think that is doing an awesome job. 09/28/2021 upon evaluation today patient appears to be doing well with regard to his leg currently. I do not see any signs of active infection at this time which is great news. No fevers, chills, nausea, vomiting, or diarrhea. I think this wound is very close to complete resolution. 10/12/2021 upon evaluation today patient actually appears to be doing awesome in regard to his leg ulcer. In fact this appears to be completely healed based on what I am seeing currently. I do not see any evidence of active infection locally nor systemically at this time which is also great news. No fevers, chills, nausea, vomiting, or diarrhea. Readmission: 12/07/2021 upon evaluation today patient presents for readmission here in the clinic. He was discharged on 10/12/2021 is completely healed. Unfortunately this has reopened at this point and he is having continual issues with new blisters over both lower extremities. This is even worse than what we previously saw. Nonetheless we did actually check his ABIs today and it did reveal that his ABIs were 0.55 on the left and 0.57 on the right. Subsequently this  is a definite change from his last arterial study which showed that he did have good blood flow at 1.01 on the right and 0.92 on the left and that was right at the beginning of 2019. Nonetheless based on what we see currently I do think he tolerated the 3 layer compression wrap but I do believe that we probably need to get him tested for his arterial flow in order to see where things stand and if there is something we can do there that would help prevent this from continue to be an ongoing issue. He did not utilize compression socks in the interim from when he was last here till this time. That something is probably going to need lifelong going forward as well. 3/9; patient presents for follow-up. He has no issues or complaints today. He tolerated the compression wrap well. He had ABIs with TBI's done. He denies signs of infection. 12/21/2021 upon evaluation today patient appears to be doing well with regard to the wounds on his legs. Both are showing signs of significant improvement which is great news although I do believe some sharp debridement would be of benefit here as well. 12/28/2021 upon  evaluation today patient appears to be doing well with regard to his wounds. Everything is showing signs of excellent improvement which I am very pleased about. I think that we are headed in the right direction here. Fortunately there does not appear to be any evidence of infection which is great news there is a little bit of hypergranulation. 01/04/2022 upon evaluation today patient appears to be doing well with regard to his wounds 2 of them are healed 1 is almost so and the other 1 is significantly better. Overall I am extremely pleased with where we stand and I think that he is making excellent progress here. I do not see any evidence of active infection locally nor systemically at this time. 01-16-2022 upon evaluation today patient's wound on the left leg is showing signs of doing quite well. Has not  completely cleared at this point but it is much improved. Fortunately I do not see any signs of infection at this time. No fevers, chills, nausea, vomiting, or diarrhea. 01-23-2022 upon evaluation today patient's wound of the left leg actually appears to be pretty much completely healed which is great news. I do not see any signs of active infection locally or systemically which is excellent. With that being said on the right leg what wound is measuring smaller the other 1 is a new wound that just showed up fortunately its not too bad. Has been using Xeroform here and that seems to be doing decently well which is great news. Unfortunately his blood pressure is significantly high we gave him the readings for the past 4-5 visits as well as a recommendation to make an appointment to go discuss this with his primary care provider patient states that he is going to look into doing this. 01-30-2022 upon evaluation today patient appears to be doing well with regard to his left leg everything appears to be healed. On the right leg the more anterior wound is healed the more medial wound that I been concerned about a possible skin cancer unfortunately still does not look great to me. I do believe that we should probably do a biopsy I have talked about it with him a few times I think though it is probably time to go ahead and do this at this point. 02-09-2022 upon evaluation today patient appears to be doing well with regard to his legs. On the left this appears to be completely healed. On the right he does have 2 areas and be perfectly honest one of them is a skin cancer that he is going to the Mohs surgery clinic for the other seems to be healing nicely. Readmission: 08-02-2022 upon evaluation today patient appears for reevaluation here in our clinic concerning issues that he has been having with wounds over the bilateral lower extremities. I last saw him in May 2023 and at that point we had him completely healed.  Unfortunately he is tells me this has broken down to some degree since that point. 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 He tells me this 1 seems to just be getting bigger and bigger not improving. Fortunately he is not having any significant pain which is good news he does have quite a bit of swelling and he tells me that his fluid pills are not recommended  for him to take daily but just in 3-day intervals here and there. 08-09-2022 upon evaluation today patient appears to be doing still somewhat poorly in regard to his legs although in general he does not appear to be feeling as good as he has been. Fortunately there does not appear to be any signs of infection which is good news. With that being said he is having some issues here with having and overall poor feeling in general which again is good I think going to be the biggest complicating factor. He actually seems to be coughing I do not hear any wheezing right now I did listen to his chest he did not have good airflow down low however makes me suspicious for bronchitis or even possibly pneumonia which could be part of what is going on here as well. Fortunately I do not see any evidence of anything worsening in regard to his legs but I definitely believe that he needs to continue with the compression wraps he took them off yesterday to shower has not had anything on for 24 hours that is why his legs are so swollen today. With regard to his pathology report I did review that it showed some squamous abnormality but no signs of distinct carcinoma. With that being said it was saying that it could be adjacent to a squamous cell carcinoma nonetheless my suggestion is can be that we have the patient take copy of this report and give it to his Mohs  surgeon in order for them to see if there is anything they feel like needs to be done further. With that being said right now I feel like the primary thing is going to be for [place] to try to get his swelling down and keep that down into that hand since he is having so much drainage I believe we can have to bring him in for dressing changes twice a week doing a nurse visit on Mondays. 11/9; since the patient was last here he spent the night in the emergency room he received IV Lasix. Also received antibiotics although he was not discharged on either 1 of these. He also saw his cardiology office who put him on regular Lasix 20 mg [previously on as needed Lasix 20 mg]. Per our intake nurse the swelling in his legs is remarkably better but he still has bilateral lower extremity wounds. He still has wounds on the bilateral lower extremities most problematically on the left lateral calf. He has been using silver alginate under 3 layer compression. 08-23-2022 upon evaluation today patient appears to be doing much better than the last time I saw him 2 weeks ago. At that point I was very concerned about how he was doing he did see Dr. Sherrie Mustache his primary care provider they got him on some blood pressure medication in general his color and overall appearance looks to be doing much improved compared to the last time I saw him. 09-04-2022 upon evaluation today patient appears to be doing well currently in regard to his wounds. Everything is showing signs of improvement which is great news. Fortunately there does not appear to be any signs of active infection locally or systemically at this time. No fevers, chills, nausea, vomiting, or diarrhea. LORREN, COMMANDER [name] ([id number]) [id number] Page 9 of 12 09-10-2022 upon evaluation today patient appears to be doing better in regard to his wounds although the [facility] was extremely stuck to the wound bed. Fortunately there does not appear to  be any signs  of infection locally or systemically at this time which is great news. No fevers, chills, nausea, vomiting, or diarrhea. 09-17-2022 upon evaluation today patient appears to be doing well currently in regard to his wounds in general. The right leg actually showing signs of excellent improvement and very pleased with where things stand in that regard. Fortunately I do not see any evidence of infection locally or systemically at this time which is great news. No fevers, chills, nausea, vomiting, or diarrhea. 09-24-2022 upon evaluation today patient appears to be doing well currently in regard to his wounds. Things look to be doing quite well. With that being said he did have a result unfortunately on the pathology which showed that he did have a squamous cell carcinoma noted on the biopsy sample I sent last week. He is seeing his dermatologist tomorrow in that regard. With that being said other than that however he seems to really be making some pretty good progress here which is good news. No fevers, chills, nausea, vomiting, or diarrhea. 12/26; the patient has 2 open wounds remaining on the left leg. One is on the left anterior mid tibia and the other is on the right lateral knee just outside of the popliteal fossa. 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 The patient has been to see dermatology Dr. Adolphus Birchwood who apparently is making him a referral to the [facility]. He does not yet have an appointment 10-12-2022 upon evaluation today patient appears to be doing well currently in regard to his wound. He has been tolerating the dressing changes without complication and overall feel like we are headed in the right direction. Fortunately I do not see any signs of infection locally or systemically at this time which is great news. No fevers, chills, nausea, vomiting, or diarrhea. 10-23-2022 upon evaluation today patient appears to be doing  well currently in regard to his wound. He has been tolerating the dressing changes without complication. Fortunately there does not appear to be any signs of active infection locally nor systemically which is great news and overall I am extremely pleased with where we stand currently. No fevers, chills, nausea, vomiting, or diarrhea. 10-26-2022 upon evaluation today patient appears to be doing well currently in regard to his wounds. Everything is showing signs of improvement and this is great news. Fortunately I see no evidence of active infection systemically. He does seem to be doing much better in regard to the local infection in regard to his leg. The smell is also greatly improved. Overall I am extremely happy with where we stand today. This is after just a few days with the antibiotic on board. 11-05-2022 upon evaluation today patient appears to be doing well currently in regard to his wounds although the wound where they performed the Mohs surgery does look a little bit hyper granulated I think switching to [facility] may be better for him. He voiced understanding. Fortunately there does not appear to be any evidence of active infection locally nor systemically at this time. 11-12-2022 upon evaluation today patient appears to be doing better in regard to both wounds he has been tolerating the dressing changes without complication. There is no signs of infection and in general I think you are doing quite well. No fevers, chills, nausea, vomiting, or diarrhea. 11-20-2022 upon evaluation today patient appears to be doing well currently in regard to his wounds. He has been tolerating the dressing changes without complication. Fortunately there does not  appear to be any signs of infection at this time. No fevers, chills, nausea, vomiting, or diarrhea. 11-27-2022 upon evaluation today patient appears to be doing somewhat poorly in regard to his leg in general he has a lot of areas where he looks like he  had some spots that popped up. There with regard to new possible blisters. In general I am actually very concerned about the fact that the wrap may be causing some irritation here. I think that we can try to not do the wrap for 1 week, and given the prescription for mupirocin ointment which I will send into the pharmacy for him. 12-04-2022 upon evaluation today patient appears to be doing well currently in regard to his wounds in fact he appears to be pretty much completely healed based on what I am seeing at this point. I do not see any signs of active infection locally nor systemically at this time which is great news. No fevers, chills, nausea, vomiting, or diarrhea. 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 I discussed that with him today he is aware of the situation.-2024 upon evaluation patient presents for readmission here in the clinic actually last saw him December 04, 2022. With that being said previously his dermatologist had asked that if there was something the need to be addressed from a dermatology standpoint and specifically biopsy that we make referral back to them to allow them to do it which I am definitely happy to do. 01-31-2023 upon evaluation today patient appears to be doing well currently in regard to his wound in fact this looks better he did see Dr. Adolphus Birchwood his dermatologist yesterday and they did perform a biopsy. With that being said he actually looks like he is doing much better Dr. Adolphus Birchwood feels like this may not be a cancerous lesion which will be very good news at the same time I definitely wanted to make sure especially considering his history and the way this looks when we saw him last week. Nonetheless I am extremely pleased with the fact that he is doing so much better at this point. 02-07-2023 upon evaluation  today patient appears to be doing well currently in regard to his wound from the standpoint of size it has not gotten any larger. With that being said he does have some issues here still with what appears to be potentially some infection. Wound does not look quite as good as it did last week. We are still waiting on the results for the pathology. I did put a call into dermatology but I have not heard anything back from them as of yet. 02-14-2023 upon evaluation today patient appears to be doing well with regard to his wound infection is definitely under control and looks much better. Fortunately I do not see any signs of systemic infection with anyone locally I think this is improved greatly. 02-19-2023 upon evaluation today patient appears to be doing well currently in regard to his wound which is actually measuring smaller and looking much better. Fortunately I do not see any evidence of active infection locally nor systemically which is great news and overall I am extremely pleased with where we stand today. 02-26-2023 upon evaluation today patient appears to be doing well currently in regard to his wound. He is actually been tolerating the dressing changes without complication. Fortunately  there does not appear to be any signs of active infection locally nor systemically which is great news and overall I am extremely pleased with where we stand currently. 03-12-2023 upon evaluation today patient's wound actually showed signs of excellent improvement. I am very pleased with where we stand I do believe that we are making good progress here. I do not see any signs of active infection. 03-19-2023 upon evaluation today patient actually appears to be making excellent progress in regard to his leg and getting this closed. In fact is just a very small area that is actually open at this point. I am actually very pleased with what we are seeing today. 03-26-2023 upon evaluation today patient appears to be doing  well currently in regard to his leg which is actually showing signs of being completely healed. Fortunately I do not see any signs of active infection locally or systemically which is great news and in general I do believe that we are moving in the right direction here. Readmission: 05-16-2023 upon evaluation today patient presents for reevaluation here in the clinic concerning a wound on the left anterior/lateral lower extremity. This is similar to where the wound was last time I saw him but not exactly the same. Fortunately there does not appear to be any signs of active infection at this time which is great news. The patient's past medical history really has not changed significantly since last time I saw him. 05-24-2023 upon evaluation patient actually appears to be doing excellent in regard to his leg ulcer. He has been tolerating the dressing changes without ALFREDA, FAGIN ([id number]) [id number] Page 10 of 12 complication in general I do feel like there were making excellent headway towards complete closure. I do not see any signs of active infection at this point. 8/29; left lateral lower leg in the setting of chronic venous insufficiency. We have been using Hydrofera Blue under Urgo K2. Wounds are making nice improvements 06-20-2023 upon evaluation today patient appears to be doing well currently in regard to his leg ulcer. This is actually showing signs of being significantly smaller compared even last week's nurse visit this looks much better. Fortunately I do not see any need for sharp debridement he seems to be doing quite well. 06-27-2023 upon evaluation today patient appears to be doing well currently in regard to his wound. He is tolerating the dressing changes without complication. There is a little bit of hypergranulation on the use of silver nitrate today to help keep this under control. Fortunately I do not see any evidence of active infection locally or  systemically which is great news. 07-04-2023 upon evaluation today patient appears to be doing well currently in regard to his wound. He has been tolerating the dressing changes without complication. Fortunately I do not see any signs of active infection locally or systemically which is great news and in general I do believe that he is making really good headway towards complete closure. This looks much better after starting the antibiotics. 07-09-2023 upon evaluation today patient appears to be doing excellent in regard to his leg which is actually significantly improved. I am very pleased with where we stand and I do believe that he is making really good headway towards getting this closed in fact it pretty much appears to be almost closed today there is just a very tiny area that I think needs 1 week to toughen up before we cut him loose completely. 07-16-2023 upon evaluation today patient appears to be doing  well currently in regard to his wounds. In fact everything is completely healed and looks to be doing great. Fortunately I do not see any signs of active infection at this time which is great news. No fevers, chills, nausea, vomiting, or diarrhea. 07-30-2023 upon evaluation today patient unfortunately has a breakdown of the wound on the lateral aspect of his leg. Unfortunately I am a little concerned about the overall appearance of the leg I feel like he may require a biopsy to further evaluate the situation. I discussed that with the patient today. With that being said I previously been asked by Dr. Adolphus Birchwood not to perform any of the biopsies myself but to let them see it prior to the biopsy. For that reason I would go ahead and see about making referral to Dr. Durene Cal office to get this scheduled as quickly as possible is my hope. We reached out to them have not heard back as of yet. 08-08-2023 upon evaluation today patient's wound actually showed signs of doing really about the same with regard  to his wound. There does not appear to be any signs of active infection at this time which is good news with that being said they did not biopsy this at the office today when he went to dermatology. This is unfortunate as I thought that we can do that now. I will get a try to see if we get in touch with Dr. Durene Cal office if were not able to get this done before I see him next week, going to just do the biopsy myself just to make sure we know what we are dealing with patient voiced understanding he is in agreement with the plan. 08-15-2023 upon evaluation today patient appears to be doing pretty well currently in regard to his wound which is okay he did have a biopsy today apparently they had a hard time getting it to stop bleeding that had a pressure dressing on pretty tightly the good news is hemostasis has been achieved. With that being said the patient tells me that he unfortunately when they unwrapped him had several areas that were somewhat pussy and opened in regard to his leg I think he may have a bit of an infection ensuing again already. Objective Constitutional Well-nourished and well-hydrated in no acute distress. Vitals Time Taken: 3:31 PM, Height: 75 in, Weight: 220 lbs, BMI: 27.5, Temperature: 98.2 F, Pulse: 57 bpm, Respiratory Rate: 18 breaths/min, Blood Pressure: 165/67 mmHg. Respiratory normal breathing without difficulty. Psychiatric this patient is able to make decisions and demonstrates good insight into disease process. Alert and Oriented x 3. pleasant and cooperative. General Notes: Upon inspection patient's wound bed again showed signs of doing about the same with using the oil emulsion followed by the Hydrofera Blue at this point which has done pretty well for him all things considered he has had a biopsy today will be to see what the results of that show. That was done at dermatology with Dr. Durene Cal office. With that being said with the fact that he is having a lot of  the pustules and openings over his leg in general I think he is probably having another issue with a staph breakdown I think that we should go ahead and see about getting him an antibiotic sent back in which seems to typically get this under control for him. Integumentary (Hair, Skin) Wound #21R status is Open. Original cause of wound was Gradually Appeared. The date acquired was: 04/08/2023. The wound has been in  treatment 13 weeks. The wound is located on the Left,Lateral Lower Leg. The wound measures 3.3cm length x 2.5cm width x 0.1cm depth; 6.48cm^2 area and 0.648cm^3 volume. There is a none present amount of drainage noted. There is large (67-100%) hyper - granulation within the wound bed. There is no necrotic tissue within the wound bed. 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 Bathing/ Shower/ Hygiene: May shower; gently cleanse wound with antibacterial soap, rinse and pat dry prior to dressing wounds The following medication(s) was prescribed: Bactrim DS oral 800 mg-160 mg tablet 1 1 tablet oral twice a day x 14 days. Do not take potassium while on this medication starting 08/15/2023 WOUND #21R: - Lower Leg Wound Laterality: Left, Lateral Cleanser: Soap and Water 1 x Per Week/30 Days Discharge Instructions: Gently cleanse wound with antibacterial soap, rinse and pat dry prior to dressing wounds Cleanser: Wound Cleanser 1 x Per Week/30 Days Discharge Instructions: Wash your hands with soap and water. Remove old dressing, discard  into plastic bag and place into trash. Cleanse the wound with Wound Cleanser prior to applying a clean dressing using gauze sponges, not tissues or cotton balls. Do not scrub or use excessive force. Pat dry using gauze sponges, not tissue or cotton balls. Peri-Wound Care: AandD Ointment 1 x Per Week/30 Days Discharge Instructions: Apply AandD Ointment as directed Prim Dressing: Hydrofera Blue Ready Transfer Foam, 2.5x2.5 (in/in) 1 x Per Week/30 Days ary Discharge Instructions: Apply Hydrofera Blue Ready to wound bed as directed Prim Dressing: Curad Oil Emulsion Dressing 3x3 (in/in) 1 x Per Week/30 Days ary Discharge Instructions: prior to HB Secondary Dressing: Zetuvit Plus 4x8 (in/in) 1 x Per Week/30 Days Com pression Wrap: Urgo K2, two layer compression system, regular 1 x Per Week/30 Days 1. Based on what I see I do believe that the patient would benefit from a continuation of therapy with regard to his leg. We can continue with the oil emulsion Hydrofera Blue to the main wound. 2. I am going to recommend Hydrofera Blue with no all emulsion to the other open areas. 3. I am also going to recommend the patient should continue to utilize the compression wrapping to get and rewrap today. 4. Will continue to see how things progress over the next week and subsequently will make any adjustments in care as necessary going forward at that time. We will see patient back for reevaluation in 1 week here in the clinic. If anything worsens or changes patient will contact our office for additional recommendations. Electronic Signature(s) Signed: 08/15/2023 4:01:37 PM By: Allen Derry PA-C Entered By: Allen Derry on 08/15/2023 13:01:37 -------------------------------------------------------------------------------- SuperBill Details Patient Name: Date of Service: [name] [name] N, [name] 08/15/2023 Medical Record Number: [id number] Patient Account Number: [id number] Date of Birth/Sex: Treating RN: 1946/01/02  (77 y.o. [name] Primary Care Provider: [name] Other Clinician: Referring Provider: Treating Provider/Extender: [name] in Treatment: 13 Diagnosis Coding ICD-10 Codes Code Description I89.0 Lymphedema, not elsewhere classified 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 L97.822 Non-pressure chronic ulcer of other part of left lower leg with fat layer exposed I73.89 Other specified peripheral  vascular diseases I48.0 Paroxysmal atrial fibrillation WYLIE, BRUSKI [name] ([id number]) [id number] Page 12 of 12 I10 Essential (primary) hypertension Facility Procedures : CPT4 Code: [id number] Description: (Facility Use Only) [phone] - APPLY MULTLAY COMPRS LWR LT LEG Modifier: Quantity: 1 Physician Procedures : CPT4 Code Description Modifier [id number] - WC PHYS LEVEL 4 - EST PT ICD-10 Diagnosis Description I89.0 Lymphedema, not elsewhere classified 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  L97.822 Non-pressure chronic ulcer of other part of left lower leg with fat layer exposed I73.89 Other specified peripheral vascular diseases Quantity: 1 Electronic Signature(s) Signed: 08/15/2023 4:22:08 PM By: [name] MSN RN CNS WTA Signed: 08/19/2023 4:31:01 PM By: Allen Derry PA-C Previous Signature: 08/15/2023 4:01:52 PM Version By: Allen Derry PA-C Entered By: [name] on 08/15/2023 13:22:08

## 2023-08-15 NOTE — Progress Notes (Signed)
[name], DISNEY R ([id number]) [id number] Page 1 of 9 Visit Report for 08/15/2023 Arrival Information Details Patient Name: Date of Service: [name] 08/15/2023 3:30 PM Medical Record Number: [id number] Patient Account Number: [id number] Date of Birth/Sex: Treating RN: 1946/08/07 (77 y.o. [name] Primary Care Khori Rosevear: [name] Other Clinician: Referring Aikeem Lilley: Treating Rose-Marie Hickling/Extender: [name] in Treatment: 13 Visit Information History Since Last Visit Added or deleted any medications: No Patient Arrived: Ambulatory Any new allergies or adverse reactions: No Arrival Time: 15:27 Has Dressing in Place as Prescribed: Yes Accompanied By: self Has Compression in Place as Prescribed: Yes Transfer Assistance: None Pain Present Now: No Patient Identification Verified: Yes Secondary Verification Process Completed: Yes Patient Requires Transmission-Based Precautions: No Patient Has Alerts: No Electronic Signature(s) Signed: 08/15/2023 4:24:45 PM By: [name] MSN RN CNS WTA Entered By: [name] on 08/15/2023 15:27:29 -------------------------------------------------------------------------------- Clinic Level of Care Assessment Details Patient Name: Date of Service: [place] [facility] R. 08/15/2023 3:30 PM Medical Record Number: [id number] Patient Account Number: [id number] Date of Birth/Sex: Treating RN: 1945/11/04 (77 y.o. [name] Primary Care Kwynn Schlotter: [name] Other Clinician: Referring Stpehanie Montroy: Treating Zebulun Deman/Extender: [name] in Treatment: 13 Clinic Level of Care Assessment Items TOOL 4 Quantity Score []  - 0 Use when only an EandM is performed on FOLLOW-UP visit ASSESSMENTS - Nursing Assessment / Reassessment []  - 0 Reassessment of Co-morbidities (includes updates in patient status) []  - 0 Reassessment of Adherence to Treatment Plan ASSESSMENTS -  Wound and Skin A ssessment / Reassessment []  - 0 Simple Wound Assessment / Reassessment - one wound []  - 0 Complex Wound Assessment / Reassessment - multiple wounds MURDOCK, JELLISON ([id number]) [id number] Page 2 of 9 []  - 0 Dermatologic / Skin Assessment (not related to wound area) ASSESSMENTS - Focused Assessment []  - 0 Circumferential Edema Measurements - multi extremities []  - 0 Nutritional Assessment / Counseling / Intervention []  - 0 Lower Extremity Assessment (monofilament, tuning fork, pulses) []  - 0 Peripheral Arterial Disease Assessment (using hand held doppler) ASSESSMENTS - Ostomy and/or Continence Assessment and Care []  - 0 Incontinence Assessment and Management []  - 0 Ostomy Care Assessment and Management (repouching, etc.) PROCESS - Coordination of Care []  - 0 Simple Patient / Family Education for ongoing care []  - 0 Complex (extensive) Patient / Family Education for ongoing care []  - 0 Staff obtains Chiropractor, Records, T Results / Process Orders est []  - 0 Staff telephones HHA, Nursing Homes / Clarify orders / etc []  - 0 Routine Transfer to another Facility (non-emergent condition) []  - 0 Routine Hospital Admission (non-emergent condition) []  - 0 New Admissions / Manufacturing engineer / Ordering NPWT Apligraf, etc. , []  - 0 Emergency Hospital Admission (emergent condition) []  - 0 Simple Discharge Coordination []  - 0 Complex (extensive) Discharge Coordination PROCESS - Special Needs []  - 0 Pediatric / Minor Patient Management []  - 0 Isolation Patient Management []  - 0 Hearing / Language / Visual special needs []  - 0 Assessment of Community assistance (transportation, [name], etc.) []  - 0 Additional assistance / Altered mentation []  - 0 Support Surface(s) Assessment (bed, cushion, seat, etc.) INTERVENTIONS - Wound Cleansing / Measurement []  - 0 Simple Wound Cleansing - one wound []  - 0 Complex Wound  Cleansing - multiple wounds []  - 0 Wound Imaging (photographs - any number of wounds) []  - 0 Wound Tracing (instead of photographs) []  - 0 Simple Wound Measurement - one  wound []  - 0 Complex Wound Measurement - multiple wounds INTERVENTIONS - Wound Dressings []  - 0 Small Wound Dressing one or multiple wounds []  - 0 Medium Wound Dressing one or multiple wounds []  - 0 Large Wound Dressing one or multiple wounds []  - 0 Application of Medications - topical []  - 0 Application of Medications - injection INTERVENTIONS - Miscellaneous []  - 0 External ear exam []  - 0 Specimen Collection (cultures, biopsies, blood, body fluids, etc.) []  - 0 Specimen(s) / Culture(s) sent or taken to Lab for analysis HADDON, FYFE ([id number]) [id number] Page 3 of 9 []  - 0 Patient Transfer (multiple staff / Nurse, adult / Similar devices) []  - 0 Simple Staple / Suture removal (25 or less) []  - 0 Complex Staple / Suture removal (26 or more) []  - 0 Hypo / Hyperglycemic Management (close monitor of Blood Glucose) []  - 0 Ankle / Brachial Index (ABI) - do not check if billed separately []  - 0 Vital Signs Has the patient been seen at the hospital within the last three years: Yes Total Score: 0 Level Of Care: ____ Electronic Signature(s) Signed: 08/15/2023 4:24:45 PM By: [name] MSN RN CNS WTA Entered By: [name] on 08/15/2023 16:21:53 -------------------------------------------------------------------------------- Compression Therapy Details Patient Name: Date of Service: [name], [name] 08/15/2023 3:30 PM Medical Record Number: [id number] Patient Account Number: [id number] Date of Birth/Sex: Treating RN: [birth year] (77 y.o. [name] Primary Care Angello Chien: [name] Other Clinician: Referring Justa Hatchell: Treating [name]/Extender: [name] in Treatment: 13 Compression Therapy Performed for Wound Assessment: Wound #21R  Left,Lateral Lower Leg Performed By: Clinician [name], RN Compression Type: Four Layer Post Procedure Diagnosis Same as Pre-procedure Electronic Signature(s) Signed: 08/15/2023 4:18:03 PM By: [name] MSN RN CNS WTA Entered By: [name] on 08/15/2023 16:18:03 -------------------------------------------------------------------------------- Encounter Discharge Information Details Patient Name: Date of Service: [name], [name] 08/15/2023 3:30 PM Medical Record Number: [id number] Patient Account Number: [id number] Date of Birth/Sex: Treating RN: [birth year] (77 y.o. [name] Primary Care Shaquetta Arcos: [name] Other Clinician: Referring Ponciano Shealy: Treating Issaih Kaus/Extender: [name] in Treatment: [address], [place] R ([id number]) [phone].pdf Page 4 of 9 Encounter Discharge Information Items Discharge Condition: Stable Ambulatory Status: Ambulatory Discharge Destination: Home Transportation: Private Auto Accompanied By: self Schedule Follow-up Appointment: Yes Clinical Summary of Care: Electronic Signature(s) Signed: 08/15/2023 4:23:02 PM By: [name] MSN RN CNS WTA Entered By: [name] on 08/15/2023 16:23:01 -------------------------------------------------------------------------------- Lower Extremity Assessment Details Patient Name: Date of Service: [name], [name] 08/15/2023 3:30 PM Medical Record Number: [id number] Patient Account Number: [id number] Date of Birth/Sex: Treating RN: 12-May-1946 (77 y.o. [name] Primary Care Logan Baltimore: [name] Other Clinician: Referring Katiria Calame: Treating Garo Heidelberg/Extender: [name] in Treatment: 13 Edema Assessment Assessed: Kyra Searles: No] Franne Forts: No] [Left: Edema] [Right: :] Calf Left: Right: Point of Measurement: 37 cm From Medial Instep 41 cm Ankle Left: Right: Point of Measurement: 12 cm From Medial Instep 24  cm Vascular Assessment Pulses: Dorsalis Pedis Palpable: [Left:Yes] Extremity colors, hair growth, and conditions: Extremity Color: [Left:Hyperpigmented] Hair Growth on Extremity: [Left:No] Temperature of Extremity: [Left:Warm] Capillary Refill: [Left:< 3 seconds] Dependent Rubor: [Left:No] Blanched when Elevated: [Left:No No] Toe Nail Assessment Left: Right: Thick: No Discolored: No Deformed: No Improper Length and Hygiene: No Electronic Signature(s) Signed: 08/15/2023 4:24:45 PM By: [name] MSN RN CNS WTA [place],Signed: 08/15/2023 4:24:45 PM By: [name] MSN RN CNS WTA  Vidyuth R ([id number]) [id number] Page 5 of 9 Entered By: [name] on 08/15/2023 15:47:03 -------------------------------------------------------------------------------- Multi Wound Chart Details Patient Name: Date of Service: [name] 08/15/2023 3:30 PM Medical Record Number: [id number] Patient Account Number: [id number] Date of Birth/Sex: Treating RN: [birth year] (77 y.o. [name] Primary Care Marqui Formby: [name] Other Clinician: Referring Aleesa Sweigert: Treating Yulitza Shorts/Extender: [name] in Treatment: 13 Vital Signs Height(in): 75 Pulse(bpm): 57 Weight(lbs): 220 Blood Pressure(mmHg): 165/67 Body Mass Index(BMI): 27.5 Temperature(F): 98.2 Respiratory Rate(breaths/min): 18 [21R:Photos:] [[name]:[name]] Left, Lateral Lower Leg [name] [name] Wound Location: Gradually Appeared [name] [name] Wounding Event: Vasculitis [name] [name] Primary Etiology: Arrhythmia, Hypertension, Gout [name] [name] Comorbid History: 04/08/2023 [name] [name] Date Acquired: 13 [name] [name] Weeks of Treatment: Open [name] [name] Wound Status: No [name] [name] Wound Recurrence: 3.3x2.5x0.1 [name] [name] Measurements L x W x D (cm) 6.48 [name] [name] A (cm) : rea 0.648 [name] [name] Volume (cm) : 52.90% [name] [name] % Reduction in A rea: 52.80% [name] [name] % Reduction in Volume: Full Thickness Without Exposed [name]  [name] Classification: Support Structures None Present [name] [name] Exudate Amount: Large (67-100%) [name] [name] Granulation Amount: None Present (0%) [name] [name] Necrotic Amount: Fascia: No [name] [name] Exposed Structures: Fat Layer (Subcutaneous Tissue): No Tendon: No Muscle: No Joint: No Bone: No Large (67-100%) [name] [name] Epithelialization: Treatment Notes Electronic Signature(s) Signed: 08/15/2023 4:24:45 PM By: [name] MSN RN CNS WTA Entered By: [name] on 08/15/2023 16:03:19 Shawnee Knapp R ([id number]) [id number] Page 6 of 9 -------------------------------------------------------------------------------- Multi-Disciplinary Care Plan Details Patient Name: Date of Service: [name] 08/15/2023 3:30 PM Medical Record Number: [id number] Patient Account Number: [id number] Date of Birth/Sex: Treating RN: 01-Nov-1945 (77 y.o. [name] Primary Care Eulice Rutledge: [name] Other Clinician: Referring Kynzleigh Bandel: Treating Taveon Enyeart/Extender: [name] in Treatment: 13 Active Inactive Wound/Skin Impairment Nursing Diagnoses: Knowledge deficit related to ulceration/compromised skin integrity Goals: Patient/caregiver will verbalize understanding of skin care regimen Date Initiated: 05/16/2023 Target Resolution Date: 09/07/2023 Goal Status: Active Ulcer/skin breakdown will have a volume reduction of 30% by week 4 Date Initiated: 05/16/2023 Date Inactivated: 06/20/2023 Target Resolution Date: 06/16/2023 Goal Status: Met Ulcer/skin breakdown will have a volume reduction of 50% by week 8 Date Initiated: 05/16/2023 Date Inactivated: 07/17/2023 Target Resolution Date: 07/16/2023 Goal Status: Met Ulcer/skin breakdown will have a volume reduction of 80% by week 12 Date Initiated: 05/16/2023 Date Inactivated: 07/17/2023 Target Resolution Date: 08/16/2023 Goal Status: Met Ulcer/skin breakdown will heal within 14 weeks Date Initiated:  05/16/2023 Date Inactivated: 07/17/2023 Target Resolution Date: 09/15/2023 Goal Status: Met Interventions: Assess patient/caregiver ability to obtain necessary supplies Assess patient/caregiver ability to perform ulcer/skin care regimen upon admission and as needed Assess ulceration(s) every visit Notes: Electronic Signature(s) Signed: 08/15/2023 4:22:15 PM By: [name] MSN RN CNS WTA Entered By: [name] on 08/15/2023 16:22:15 Pain Assessment Details -------------------------------------------------------------------------------- [name] ([id number]) [id number] Page 7 of 9 Patient Name: Date of Service: [name] 08/15/2023 3:30 PM Medical Record Number: [id number] Patient Account Number: [id number] Date of Birth/Sex: Treating RN: 02-Feb-1946 (77 y.o. [name] Primary Care Fredrica Capano: [name] Other Clinician: Referring Ruble Buttler: Treating Felita Bump/Extender: [name] in Treatment: 13 Active Problems Location of Pain Severity and Description of Pain Patient Has Paino No Site Locations Pain Management and Medication Current Pain Management: Electronic Signature(s) Signed: 08/15/2023 4:24:45 PM By: [name] MSN RN CNS WTA Entered By: [name] on  08/15/2023 15:32:56 -------------------------------------------------------------------------------- Patient/Caregiver Education Details Patient Name: Date of Service: [name] 11/7/2024andnbsp3:30 PM Medical Record Number: [id number] Patient Account Number: [id number] Date of Birth/Gender: Treating RN: 1945-12-19 (77 y.o. [name] Primary Care Physician: [name] Other Clinician: Referring Physician: Treating Physician/Extender: [name] in Treatment: 13 Education Assessment Education Provided To: Patient Education Topics Provided Wound/Skin Impairment: Handouts: Caring for Your Ulcer Methods:  Explain/Verbal Responses: State content correctly INDIGO, CHADDOCK ([id number]) [id number] Page 8 of 9 Electronic Signature(s) Signed: 08/15/2023 4:24:45 PM By: [name] MSN RN CNS WTA Entered By: [name] on 08/15/2023 16:22:24 -------------------------------------------------------------------------------- Wound Assessment Details Patient Name: Date of Service: [name], [name] 08/15/2023 3:30 PM Medical Record Number: [id number] Patient Account Number: [id number] Date of Birth/Sex: Treating RN: 1946/06/03 (77 y.o. [name] Primary Care Seriyah Collison: [name] Other Clinician: Referring Jelani Vreeland: Treating Avital Dancy/Extender: [name] in Treatment: 13 Wound Status Wound Number: [id number] Primary Etiology: Vasculitis Wound Location: Left, Lateral Lower Leg Wound Status: Open Wounding Event: Gradually Appeared Comorbid History: Arrhythmia, Hypertension, Gout Date Acquired: 04/08/2023 Weeks Of Treatment: 13 Clustered Wound: No Photos Wound Measurements Length: (cm) 3.3 Width: (cm) 2.5 Depth: (cm) 0.1 Area: (cm) 6.48 Volume: (cm) 0.648 % Reduction in Area: 52.9% % Reduction in Volume: 52.8% Epithelialization: Large (67-100%) Wound Description Classification: Full Thickness Without Exposed Suppor Exudate Amount: None Present t Structures Foul Odor After Cleansing: No Slough/Fibrino No Wound Bed Granulation Amount: Large (67-100%) Exposed Structure Granulation Quality: Hyper-granulation Fascia Exposed: No Necrotic Amount: None Present (0%) Fat Layer (Subcutaneous Tissue) Exposed: No Tendon Exposed: No Muscle Exposed: No Joint Exposed: No Bone Exposed: No Treatment Notes Wound #21R (Lower Leg) Wound Laterality: Left, Lateral LATROY, GAYMON R ([id number]) [id number] Page 9 of 9 Cleanser Soap and Water Discharge Instruction: Gently cleanse wound with antibacterial soap, rinse and pat  dry prior to dressing wounds Wound Cleanser Discharge Instruction: Wash your hands with soap and water. Remove old dressing, discard into plastic bag and place into trash. Cleanse the wound with Wound Cleanser prior to applying a clean dressing using gauze sponges, not tissues or cotton balls. Do not scrub or use excessive force. Pat dry using gauze sponges, not tissue or cotton balls. Peri-Wound Care AandD Ointment Discharge Instruction: Apply AandD Ointment as directed Topical Primary Dressing Hydrofera Blue Ready Transfer Foam, 2.5x2.5 (in/in) Discharge Instruction: Apply Hydrofera Blue Ready to wound bed as directed Curad Oil Emulsion Dressing 3x3 (in/in) Discharge Instruction: prior to HB Secondary Dressing Zetuvit Plus 4x8 (in/in) Secured With Compression Wrap Urgo K2, two layer compression system, regular Compression Stockings Add-Ons Electronic Signature(s) Signed: 08/15/2023 4:24:45 PM By: [name] MSN RN CNS WTA Entered By: [name] on 08/15/2023 15:45:51 -------------------------------------------------------------------------------- Vitals Details Patient Name: Date of Service: [name], [name] 08/15/2023 3:30 PM Medical Record Number: [id number] Patient Account Number: [id number] Date of Birth/Sex: Treating RN: 10-19-1945 (77 y.o. [name] Primary Care Abrey Bradway: [name] Other Clinician: Referring Crews Mccollam: Treating Jermel Artley/Extender: [name] in Treatment: 13 Vital Signs Time Taken: 15:31 Temperature (F): 98.2 Height (in): 75 Pulse (bpm): 57 Weight (lbs): 220 Respiratory Rate (breaths/min): 18 Body Mass Index (BMI): 27.5 Blood Pressure (mmHg): 165/67 Reference Range: 80 - 120 mg / dl Electronic Signature(s) Signed: 08/15/2023 4:24:45 PM By: [name] MSN RN CNS WTA Entered By: [name] on 08/15/2023 15:32:44

## 2023-08-20 ENCOUNTER — Encounter: Payer: Medicare PPO | Admitting: Family Medicine

## 2023-08-22 ENCOUNTER — Encounter: Payer: Medicare PPO | Admitting: Physician Assistant

## 2023-08-22 DIAGNOSIS — I10 Essential (primary) hypertension: Secondary | ICD-10-CM | POA: Diagnosis not present

## 2023-08-22 DIAGNOSIS — C44722 Squamous cell carcinoma of skin of right lower limb, including hip: Secondary | ICD-10-CM | POA: Diagnosis not present

## 2023-08-22 DIAGNOSIS — C44729 Squamous cell carcinoma of skin of left lower limb, including hip: Secondary | ICD-10-CM | POA: Diagnosis not present

## 2023-08-22 DIAGNOSIS — L97822 Non-pressure chronic ulcer of other part of left lower leg with fat layer exposed: Secondary | ICD-10-CM | POA: Diagnosis not present

## 2023-08-22 DIAGNOSIS — I890 Lymphedema, not elsewhere classified: Secondary | ICD-10-CM | POA: Diagnosis not present

## 2023-08-22 DIAGNOSIS — I480 Paroxysmal atrial fibrillation: Secondary | ICD-10-CM | POA: Diagnosis not present

## 2023-08-22 DIAGNOSIS — I7389 Other specified peripheral vascular diseases: Secondary | ICD-10-CM | POA: Diagnosis not present

## 2023-08-22 DIAGNOSIS — Z7901 Long term (current) use of anticoagulants: Secondary | ICD-10-CM | POA: Diagnosis not present

## 2023-08-22 DIAGNOSIS — Z85828 Personal history of other malignant neoplasm of skin: Secondary | ICD-10-CM | POA: Diagnosis not present

## 2023-08-22 DIAGNOSIS — M318 Other specified necrotizing vasculopathies: Secondary | ICD-10-CM | POA: Diagnosis not present

## 2023-08-22 DIAGNOSIS — I87333 Chronic venous hypertension (idiopathic) with ulcer and inflammation of bilateral lower extremity: Secondary | ICD-10-CM | POA: Diagnosis not present

## 2023-08-22 DIAGNOSIS — I482 Chronic atrial fibrillation, unspecified: Secondary | ICD-10-CM | POA: Diagnosis not present

## 2023-08-23 ENCOUNTER — Encounter: Payer: Medicare PPO | Admitting: Family Medicine

## 2023-08-23 NOTE — Progress Notes (Signed)
[name], [name] ([id number]) [id number] Page 1 of 12 Visit Report for 08/22/2023 Chief Complaint Document Details Patient Name: Date of Service: [name] 08/22/2023 8:15 [name] M Medical Record Number: [id number] Patient Account Number: [id number] Date of Birth/Sex: Treating RN: 08/24/1946 (77 y.o. [name] Primary Care Provider: Mila [name] Other Clinician: Betha [name] Referring Provider: Treating Provider/Extender: [name] in Treatment: 14 Information Obtained from: Patient Chief Complaint Left LE Ulcer Electronic Signature(s) Signed: 08/22/2023 8:58:52 AM By: [name] Derry PA-C Entered By: [name] on 08/22/2023 08:58:52 -------------------------------------------------------------------------------- HPI Details Patient Name: Date of Service: [name], [name] 08/22/2023 8:15 [name] M Medical Record Number: [id number] Patient Account Number: [id number] Date of Birth/Sex: Treating RN: [birth year] (77 y.o. [name] Primary Care Provider: Mila [name] Other Clinician: Betha [name] Referring Provider: Treating Provider/Extender: [name] in Treatment: 14 History of Present Illness HPI Description: 77 year old male who has [name] past medical history of essential hypertension, chronic atrial fibrillation, peripheral vascular disease, nonischemic cardiomyopathy,venous stasis dermatitis, gouty arthropathy, basal cell carcinoma of the right lower extremity, benign prostatic hypertrophy, long- term use of anticoagulation therapy, hyperglycemia and exercise intolerance has never been [name] smoker. the patient has had [name] vascular workup over 7 years ago and said everything was normal at that stage. He does not have any chronic problems except for cardiac issues which he sees [name] cardiologist in [place]. 08/15/2017 -- arterial and venous duplex studies still pending. 08/23/2017 -- venous  reflux studies done on 08/13/2017 shows venous incompetence throughout the left lower extremity deep system and focally at the left saphenofemoral junction. 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 No evidence of SVT or DVT in bilateral lower extremities The patient has an appointment at the end of the month to get his arterial duplex study done 09/05/2017 -- the patient was seen at the vein and vascular office yesterday by [name] ABI studies were notable for medial calcification and the toe brachial indices were normal and bilateral ankle-brachial) waveforms were normal with triphasic flow. After review of his venous studies he was not [name] candidate for laser ablation and his lymphedema was to be treated with compression stockings and lymphedema pump pumps 09/12/2017 -- had [name] low arterial study done at the [facility] -- unable to obtain reliable ABI is due to medial calcification. Bilateral toe [name] [id number] Page 2 of 12 indices were normal with the right being 1.01 and the left being 0.92 and the waveforms were triphasic bilaterally. he did get hold of 30-40 mm compression stockings but is unable to put these on. We will try and get him alternative compression stockings. 09/26/17- he is here in follow up evaluation of [name] right lower extremity ulcer;he is compliant in wearing compression stocking; ulcer almost epithelialized , anticipate healing next appointment Readmission: 11/17 point upon evaluation patient's wound currently that he is seeing [place] for today is [name] skin cancerous lesion that was cleared away by his dermatologist on the left medial calf region. He tells me that this is [name] very similar thing to what he had done previously in fact the last time he saw him in 2018 this was also what was going on at that point. Nonetheless he feels that based on what he seeing currently that this is just  having [name] lot of harder time healing although it is much closer to the surface than what he is  experienced in the past. He notes that the initial removal was in June 2022 which was this year this is now November and still has not closed. He does have some edema and definitely I think that there is some venous component to his slow healing here. Also think that we can do something better than Vaseline to try to help with getting this to clear up as quickly as possible. He does have [name] history of atrial fibrillation and is on Eliquis otherwise he really has no major medical problems that would affect wound healing. 09/07/2021 upon evaluation today patient actually appears to be doing significantly better after having wrapped him last week. Overall I think that this is making significant improvements at this time which is great news. I do not see any evidence of infection which is great news as well. No fevers, chills, nausea, vomiting, or diarrhea. 09/14/2021 upon evaluation today patient appears to be doing well with regard to his leg ulcer. He has been tolerating the dressing changes and overall I think that he is making excellent progress. I do not see any signs of active infection at this time. 09/21/2021 upon evaluation today patient actually appears to be making good progress with regard to his wound this is again measuring smaller today no debridement seems to be necessary. We have been using [name] silver collagen dressing and I think that is doing an awesome job. 09/28/2021 upon evaluation today patient appears to be doing well with regard to his leg currently. I do not see any signs of active infection at this time which is great news. No fevers, chills, nausea, vomiting, or diarrhea. I think this wound is very close to complete resolution. 10/12/2021 upon evaluation today patient actually appears to be doing awesome in regard to his leg ulcer. In fact this appears to be completely healed based on what I  am seeing currently. I do not see any evidence of active infection locally nor systemically at this time which is also great news. No fevers, chills, nausea, vomiting, or diarrhea. Readmission: 12/07/2021 upon evaluation today patient presents for readmission here in the clinic. He was discharged on 10/12/2021 is completely healed. Unfortunately this has reopened at this point and he is having continual issues with new blisters over both lower extremities. This is even worse than what we previously saw. Nonetheless we did actually check his ABIs today and it did reveal that his ABIs were 0.55 on the left and 0.57 on the right. Subsequently this is [name] definite change from his last arterial study which showed that he did have good blood flow at 1.01 on the right and 0.92 on the left and that was right at the beginning of 2019. Nonetheless based on what we see currently I do think he tolerated the 3 layer compression wrap but I do believe that we probably need to get him tested for his arterial flow in order to see where things stand and if there is something we can do there that would help prevent this from continue to be an ongoing issue. He did not utilize compression socks in the interim from when he was last here till this time. That something is probably going to need lifelong going forward as well. 3/9; patient presents for follow-up. He has no issues or complaints today. He tolerated the compression wrap well. He had ABIs with TBI's done. He denies signs of infection. 12/21/2021 upon evaluation today patient appears to be doing well with regard to the wounds  on his legs. Both are showing signs of significant improvement which is great news although I do believe some sharp debridement would be of benefit here as well. 12/28/2021 upon evaluation today patient appears to be doing well with regard to his wounds. Everything is showing signs of excellent improvement which I am very pleased about. I think  that we are headed in the right direction here. Fortunately there does not appear to be any evidence of infection which is great news there is [name] little bit of hypergranulation. 01/04/2022 upon evaluation today patient appears to be doing well with regard to his wounds 2 of them are healed 1 is almost so and the other 1 is significantly better. Overall I am extremely pleased with where we stand and I think that he is making excellent progress here. I do not see any evidence of active infection locally nor systemically at this time. 01-16-2022 upon evaluation today patient's wound on the left leg is showing signs of doing quite well. Has not completely cleared at this point but it is much improved. Fortunately I do not see any signs of infection at this time. No fevers, chills, nausea, vomiting, or diarrhea. 01-23-2022 upon evaluation today patient's wound of the left leg actually appears to be pretty much completely healed which is great news. I do not see any signs of active infection locally or systemically which is excellent. With that being said on the right leg what wound is measuring smaller the other 1 is [name] new wound that just showed up fortunately its not too bad. Has been using Xeroform here and that seems to be doing decently well which is great news. Unfortunately his blood pressure is significantly high we gave him the readings for the past 4-5 visits as well as [name] recommendation to make an appointment to go discuss this with his primary care provider patient states that he is going to look into doing this. 01-30-2022 upon evaluation today patient appears to be doing well with regard to his left leg everything appears to be healed. On the right leg the more anterior wound is healed the more medial wound that I been concerned about [name] possible skin cancer unfortunately still does not look great to me. I do believe that we should probably do [name] biopsy I have talked about it with him [name] few times I  think though it is probably time to go ahead and do this at this point. 02-09-2022 upon evaluation today patient appears to be doing well with regard to his legs. On the left this appears to be completely healed. On the right he does have 2 areas and be perfectly honest one of them is [name] skin cancer that he is going to the Mohs surgery clinic for the other seems to be healing nicely. Readmission: 08-02-2022 upon evaluation today patient appears for reevaluation here in our clinic concerning issues that he has been having with wounds over the bilateral lower extremities. I last saw him in May 2023 and at that point we had him completely healed. Unfortunately he is tells me this has broken down to some degree since that point. 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 He tells me this 1 seems to just be getting bigger and bigger not improving. Fortunately  he is not having any significant pain which is good news he does have quite [name] bit of swelling and he tells me that his fluid pills are not recommended for him to take daily but just in 3-day intervals here and there. 08-09-2022 upon evaluation today patient appears to be doing still somewhat poorly in regard to his legs although in general he does not appear to be feeling as good as he has been. Fortunately there does not appear to be any signs of infection which is good news. With that being said he is having some issues here with having and overall poor feeling in general which again is good I think going to be the biggest complicating factor. He actually seems to be coughing I do not hear any wheezing right now I did listen to his chest he did not have good airflow down low however makes me suspicious for bronchitis or even possibly pneumonia which could  be part of what is going on here as well. Fortunately I do not see any evidence of anything worsening in regard to his legs but I definitely believe that he needs to continue with the compression wraps he took them off yesterday to shower has not had anything on for 24 hours that is why his legs are so swollen today. With regard to his pathology report I did review that it showed some squamous abnormality but no signs of distinct carcinoma. With that being said it was AARIK, BELLOFATTO ([id number]) [id number] Page 3 of 12 saying that it could be adjacent to [name] squamous cell carcinoma nonetheless my suggestion is can be that we have the patient take copy of this report and give it to his Mohs surgeon in order for them to see if there is anything they feel like needs to be done further. With that being said right now I feel like the primary thing is going to be for [place] to try to get his swelling down and keep that down into that hand since he is having so much drainage I believe we can have to bring him in for dressing changes twice [name] week doing [name] nurse visit on Mondays. 11/9; since the patient was last here he spent the night in the emergency room he received IV Lasix. Also received antibiotics although he was not discharged on either 1 of these. He also saw his cardiology office who put him on regular Lasix 20 mg [previously on as needed Lasix 20 mg]. Per our intake nurse the swelling in his legs is remarkably better but he still has bilateral lower extremity wounds. He still has wounds on the bilateral lower extremities most problematically on the left lateral calf. He has been using silver alginate under 3 layer compression. 08-23-2022 upon evaluation today patient appears to be doing much better than the last time I saw him 2 weeks ago. At that point I was very concerned about how he was doing he did see Dr. Sherrie Mustache his primary care provider they got him on some blood  pressure medication in general his color and overall appearance looks to be doing much improved compared to the last time I saw him. 09-04-2022 upon evaluation today patient appears to be doing well currently in regard to his wounds. Everything is showing signs of improvement which is great news. Fortunately there does not appear to be any signs of active infection locally or systemically at this time. No fevers, chills, nausea, vomiting, or diarrhea. 09-10-2022 upon evaluation today patient  appears to be doing better in regard to his wounds although the [facility] was extremely stuck to the wound bed. Fortunately there does not appear to be any signs of infection locally or systemically at this time which is great news. No fevers, chills, nausea, vomiting, or diarrhea. 09-17-2022 upon evaluation today patient appears to be doing well currently in regard to his wounds in general. The right leg actually showing signs of excellent improvement and very pleased with where things stand in that regard. Fortunately I do not see any evidence of infection locally or systemically at this time which is great news. No fevers, chills, nausea, vomiting, or diarrhea. 09-24-2022 upon evaluation today patient appears to be doing well currently in regard to his wounds. Things look to be doing quite well. 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 He is seeing his dermatologist tomorrow in that regard. With that being said other than that however he seems to really be making some pretty good progress here which is good news. No fevers, chills, nausea, vomiting, or diarrhea. 12/26; the patient has 2 open wounds remaining on the left leg. One is on the left anterior mid tibia and the other is on the right lateral knee just outside of the popliteal fossa. The latter wound apparently has  been biopsied showing squamous cell carcinoma. The patient has been to see dermatology Dr. Adolphus Birchwood who apparently is making him [name] referral to the [facility] surgery center. He does not yet have an appointment 10-12-2022 upon evaluation today patient appears to be doing well currently in regard to his wound. He has been tolerating the dressing changes without complication and overall feel like we are headed in the right direction. Fortunately I do not see any signs of infection locally or systemically at this time which is great news. No fevers, chills, nausea, vomiting, or diarrhea. 10-23-2022 upon evaluation today patient appears to be doing well currently in regard to his wound. He has been tolerating the dressing changes without complication. Fortunately there does not appear to be any signs of active infection locally nor systemically which is great news and overall I am extremely pleased with where we stand currently. No fevers, chills, nausea, vomiting, or diarrhea. 10-26-2022 upon evaluation today patient appears to be doing well currently in regard to his wounds. Everything is showing signs of improvement and this is great news. Fortunately I see no evidence of active infection systemically. He does seem to be doing much better in regard to the local infection in regard to his leg. The smell is also greatly improved. Overall I am extremely happy with where we stand today. This is after just [name] few days with the antibiotic on board. 11-05-2022 upon evaluation today patient appears to be doing well currently in regard to his wounds although the wound where they performed the Mohs surgery does look [name] little bit hyper granulated I think switching to [facility] may be better for him. He voiced understanding. Fortunately there does not appear to be any evidence of active infection locally nor systemically at this time. 11-12-2022 upon evaluation today patient appears to be doing better in regard to  both wounds he has been tolerating the dressing changes without complication. There is no signs of infection and in general I think you are doing quite well. No fevers, chills, nausea, vomiting,  or diarrhea. 11-20-2022 upon evaluation today patient appears to be doing well currently in regard to his wounds. He has been tolerating the dressing changes without complication. Fortunately there does not appear to be any signs of infection at this time. No fevers, chills, nausea, vomiting, or diarrhea. 11-27-2022 upon evaluation today patient appears to be doing somewhat poorly in regard to his leg in general he has [name] lot of areas where he looks like he had some spots that popped up. There with regard to new possible blisters. In general I am actually very concerned about the fact that the wrap may be causing some irritation here. I think that we can try to not do the wrap for 1 week, and given the prescription for mupirocin ointment which I will send into the pharmacy for him. 12-04-2022 upon evaluation today patient appears to be doing well currently in regard to his wounds in fact he appears to be pretty much completely healed based on what I am seeing at this point. I do not see any signs of active infection locally nor systemically at this time which is great news. No fevers, chills, nausea, vomiting, or diarrhea. 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 I discussed that with him today he is aware of the situation.-2024 upon evaluation patient presents for readmission here in the clinic actually last saw him December 04, 2022. With that being said previously his dermatologist had asked that if there was something the need to be addressed from [name] dermatology standpoint and specifically biopsy that we make referral back to them to allow them to do it  which I am definitely happy to do. 01-31-2023 upon evaluation today patient appears to be doing well currently in regard to his wound in fact this looks better he did see Dr. Adolphus Birchwood his dermatologist yesterday and they did perform [name] biopsy. With that being said he actually looks like he is doing much better Dr. Adolphus Birchwood feels like this may not be [name] cancerous lesion which will be very good news at the same time I definitely wanted to make sure especially considering his history and the way this looks when we saw him last week. Nonetheless I am extremely pleased with the fact that he is doing so much better at this point. 02-07-2023 upon evaluation today patient appears to be doing well currently in regard to his wound from the standpoint of size it has not gotten any larger. With that being said he does have some issues here still with what appears to be potentially some infection. Wound does not look quite as good as it did last week. We are still waiting on the results for the pathology. I did put [name] call into dermatology but I have not heard anything back from them as of yet. 02-14-2023 upon evaluation today patient appears to be doing well with regard to his wound infection is definitely under control and looks much better. Fortunately I do not see any signs of systemic infection with anyone locally I think this is improved greatly. 02-19-2023 upon evaluation today patient appears to be doing well currently in regard to his wound which is actually measuring smaller and looking much better. Fortunately I do not see any evidence of active infection locally nor systemically which is great news and overall I am extremely pleased with where  we stand today. 02-26-2023 upon evaluation today patient appears to be doing well currently in regard to his wound. He is actually been tolerating the dressing changes without complication. Fortunately there does not appear to be any signs of active infection locally nor  systemically which is great news and overall I am extremely [name], [name] ([id number]) [id number] Page 4 of 12 pleased with where we stand currently. 03-12-2023 upon evaluation today patient's wound actually showed signs of excellent improvement. I am very pleased with where we stand I do believe that we are making good progress here. I do not see any signs of active infection. 03-19-2023 upon evaluation today patient actually appears to be making excellent progress in regard to his leg and getting this closed. In fact is just [name] very small area that is actually open at this point. I am actually very pleased with what we are seeing today. 03-26-2023 upon evaluation today patient appears to be doing well currently in regard to his leg which is actually showing signs of being completely healed. Fortunately I do not see any signs of active infection locally or systemically which is great news and in general I do believe that we are moving in the right direction here. Readmission: 05-16-2023 upon evaluation today patient presents for reevaluation here in the clinic concerning [name] wound on the left anterior/lateral lower extremity. This is similar to where the wound was last time I saw him but not exactly the same. Fortunately there does not appear to be any signs of active infection at this time which is great news. The patient's past medical history really has not changed significantly since last time I saw him. 05-24-2023 upon evaluation patient actually appears to be doing excellent in regard to his leg ulcer. He has been tolerating the dressing changes without complication in general I do feel like there were making excellent headway towards complete closure. I do not see any signs of active infection at this point. 8/29; left lateral lower leg in the setting of chronic venous insufficiency. We have been using Hydrofera Blue under Urgo K2. Wounds are making  nice improvements 06-20-2023 upon evaluation today patient appears to be doing well currently in regard to his leg ulcer. This is actually showing signs of being significantly smaller compared even last week's nurse visit this looks much better. Fortunately I do not see any need for sharp debridement he seems to be doing quite well. 06-27-2023 upon evaluation today patient appears to be doing well currently in regard to his wound. He is tolerating the dressing changes without complication. There is [name] little bit of hypergranulation on the use of silver nitrate today to help keep this under control. Fortunately I do not see any evidence of active infection locally or systemically which is great news. 07-04-2023 upon evaluation today patient appears to be doing well currently in regard to his wound. He has been tolerating the dressing changes without complication. Fortunately I do not see any signs of active infection locally or systemically which is great news and in general I do believe that he is making really good headway towards complete closure. This looks much better after starting the antibiotics. 07-09-2023 upon evaluation today patient appears to be doing excellent in regard to his leg which is actually significantly improved. I am very pleased with where we stand and I do believe that he is making really good headway towards getting this closed in fact it pretty much appears to be almost closed today there  is just [name] very tiny area that I think needs 1 week to toughen up before we cut him loose completely. 07-16-2023 upon evaluation today patient appears to be doing well currently in regard to his wounds. In fact everything is completely healed and looks to be doing great. Fortunately I do not see any signs of active infection at this time which is great news. No fevers, chills, nausea, vomiting, or diarrhea. 07-30-2023 upon evaluation today patient unfortunately has [name] breakdown of the wound on the  lateral aspect of his leg. Unfortunately I am [name] little concerned about the overall appearance of the leg I feel like he may require [name] biopsy to further evaluate the situation. I discussed that with the patient today. With that being said I previously been asked by Dr. Adolphus Birchwood not to perform any of the biopsies myself but to let them see it prior to the biopsy. For that reason I would go ahead and see about making referral to Dr. Durene Cal office to get this scheduled as quickly as possible is my hope. We reached out to them have not heard back as of yet. 08-08-2023 upon evaluation today patient's wound actually showed signs of doing really about the same with regard to his wound. There does not appear to be any signs of active infection at this time which is good news with that being said they did not biopsy this at the office today when he went to dermatology. This is unfortunate as I thought that we can do that now. I will get [name] try to see if we get in touch with Dr. Durene Cal office if were not able to get this done before I see him next week, going to just do the biopsy myself just to make sure we know what we are dealing with patient voiced understanding he is in agreement with the plan. 08-15-2023 upon evaluation today patient appears to be doing pretty well currently in regard to his wound which is okay he did have [name] biopsy today apparently they had [name] hard time getting it to stop bleeding that had [name] pressure dressing on pretty tightly the good news is hemostasis has been achieved. With that being said the patient tells me that he unfortunately when they unwrapped him had several areas that were somewhat pussy and opened in regard to his leg I think he may have [name] bit of an infection ensuing again already. 08-22-2023 upon evaluation today patient's leg appears to be somewhat better in regards to the infection but unfortunately is still having some issues here with the wound the good news is Dr.  Durene Cal report did come to me and the patient did have [name] staph infection but otherwise did not have any signs of cancer pathology. 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 Electronic Signature(s) Signed: 08/22/2023 9:01:19 AM By: [name] Derry PA-C Entered By: [name] on 08/22/2023 09:01:19 -------------------------------------------------------------------------------- Gaynelle Adu TISS Details Patient Name: Date of Service: [name], [name] 08/22/2023 8:15 [name] M Medical Record Number: [id number] Patient Account Number: [id number] Date of Birth/Sex: Treating RN: [birth year] (77 y.o. [place], [place], [place] [name] ([id number]) [id number] Page 5 of 12 Primary Care Provider: Mila [name] Other Clinician: Betha [name] Referring Provider: Treating Provider/Extender: [name] in Treatment: 14 Procedure Performed for: Wound #21R Left,Lateral Lower Leg Performed By: Physician [name] Derry, PA-C The following information was scribed by: [name] The information was scribed for: [name]  Post Procedure Diagnosis Same as Pre-procedure Notes one silver nitrate stick used for hypergranulation tissue Electronic Signature(s) Signed: 08/22/2023 5:18:23 PM By: [name] Entered By: [name] on 08/22/2023 17:00:41 -------------------------------------------------------------------------------- Physical Exam Details Patient Name: Date of Service: [name] [name], [name] 08/22/2023 8:15 [name] M Medical Record Number: [id number] Patient Account Number: [id number] Date of Birth/Sex: Treating RN: 23-Nov-1945 (77 y.o. [name] Primary Care Provider: Mila [name] Other Clinician: Betha [name] Referring Provider: Treating Provider/Extender: [name] in Treatment: 14 Constitutional Well-nourished and well-hydrated in no acute  distress. Respiratory normal breathing without difficulty. Psychiatric this patient is able to make decisions and demonstrates good insight into disease process. Alert and Oriented x 3. pleasant and cooperative. Notes Upon inspection patient's wound bed actually showed signs of good granulation and epithelization at this point. Fortunately there does not appear to be any signs of active infection at this time. No fevers, chills, nausea, vomiting, or diarrhea. Electronic Signature(s) Signed: 08/22/2023 9:01:41 AM By: [name] Derry PA-C Entered By: [name] on 08/22/2023 09:01:41 [name], [name] ([id number]) [id number] Page 6 of 12 -------------------------------------------------------------------------------- Physician Orders Details Patient Name: Date of Service: [name] 08/22/2023 8:15 [name] M Medical Record Number: [id number] Patient Account Number: [id number] Date of Birth/Sex: Treating RN: 10-06-1946 (77 y.o. [name] Primary Care Provider: Mila [name] Other Clinician: Betha [name] Referring Provider: Treating Provider/Extender: [name] in Treatment: 14 The following information was scribed by: [name] The information was scribed for: [name] Verbal / Phone Orders: No Diagnosis Coding Follow-up Appointments Return Appointment in 1 week. Nurse Visit as needed Bathing/ Shower/ Hygiene May shower; gently cleanse wound with antibacterial soap, rinse and pat dry prior to dressing wounds Edema Control - Orders / Instructions Elevate, Exercise Daily and [name] void Standing for Long Periods of Time. Elevate leg(s) parallel to the floor when sitting. Medications-Please add to medication list. ntibiotics - continue Bactrim as directed P.O. [name] Wound Treatment Wound #21R - Lower Leg Wound Laterality: Left, Lateral Cleanser: Soap and Water 1 x Per Week/30 Days Discharge Instructions: Gently cleanse  wound with antibacterial soap, rinse and pat dry prior to dressing wounds Cleanser: Wound Cleanser 1 x Per Week/30 Days Discharge Instructions: Wash your hands with soap and water. Remove old dressing, discard into plastic bag and place into trash. Cleanse the wound with Wound Cleanser prior to applying [name] clean dressing using gauze sponges, not tissues or cotton balls. Do not scrub or use excessive force. Pat dry using gauze sponges, not tissue or cotton balls. Topical: Triamcinolone Acetonide Cream, 0.1%, 15 (g) tube 1 x Per Week/30 Days Discharge Instructions: Apply to entire lower leg Prim Dressing: Hydrofera Blue Ready Transfer Foam, 2.5x2.5 (in/in) 1 x Per Week/30 Days ary Discharge Instructions: Apply Hydrofera Blue Ready to wound bed as directed Prim Dressing: Curad Oil Emulsion Dressing 3x3 (in/in) ary 1 x Per Week/30 Days Discharge Instructions: prior to HB Secondary Dressing: Zetuvit Plus 4x8 (in/in) 1 x Per Week/30 Days Compression Wrap: Urgo K2, two layer compression system, regular 1 x Per Week/30 Days Electronic Signature(s) Signed: 08/22/2023 5:18:23 PM By: [name] Signed: 08/22/2023 5:33:14 PM By: [name] Derry PA-C Entered By: [name] on 08/22/2023 08:40:33 -------------------------------------------------------------------------------- Problem List Details Patient Name: Date of Service: [name], [name] 08/22/2023 8:15 [name] M Medical Record Number: [id number] Patient Account Number: [id number] Date of Birth/Sex: Treating RN: 1945-10-12 (77 y.o. [place], [place], [place] [name] ([id number])  [id number].pdf Page 7 of 12 Primary Care Provider: Mila [name] Other Clinician: Betha [name] Referring Provider: Treating Provider/Extender: [name] in Treatment: 14 Active Problems ICD-10 Encounter Code Description Active Date MDM Diagnosis I89.0 Lymphedema, not elsewhere classified 05/16/2023 No Yes I87.333  Chronic venous hypertension (idiopathic) with ulcer and inflammation of 05/16/2023 No Yes bilateral lower extremity L97.822 Non-pressure chronic ulcer of other part of left lower leg with fat layer exposed8/05/2023 No Yes I73.89 Other specified peripheral vascular diseases 05/16/2023 No Yes I48.0 Paroxysmal atrial fibrillation 05/16/2023 No Yes I10 Essential (primary) hypertension 05/16/2023 No Yes Inactive Problems Resolved Problems Electronic Signature(s) Signed: 08/22/2023 8:58:44 AM By: [name] Derry PA-C Entered By: [name] on 08/22/2023 08:58:44 -------------------------------------------------------------------------------- Progress Note Details Patient Name: Date of Service: [name], [name] 08/22/2023 8:15 [name] M Medical Record Number: [id number] Patient Account Number: [id number] Date of Birth/Sex: Treating RN: 01/21/1946 (77 y.o. 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 the patient has had [name] vascular workup over 7 years ago and said everything was normal at that stage. He does not have any chronic problems except for cardiac issues which he sees [name] cardiologist in [place]. 08/15/2017 -- arterial and venous duplex studies still pending. 08/23/2017 -- venous  reflux studies done on 08/13/2017 shows venous incompetence throughout the left lower extremity deep system and focally at the left saphenofemoral junction. 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 No evidence of SVT or DVT in bilateral lower extremities The patient has an appointment at the end of the month to get his arterial duplex study done 09/05/2017 -- the patient was seen at the vein and vascular office yesterday by [name] ABI studies were notable for medial calcification and the toe brachial indices were normal and bilateral ankle-brachial) waveforms were normal with triphasic flow. After review of his venous studies he was not [name] candidate for laser ablation and his lymphedema was to be treated with compression stockings and lymphedema pump pumps 09/12/2017 -- had [name] low arterial study done at the [facility] -- unable to obtain reliable ABI is due to medial calcification. Bilateral toe indices were normal with the right being 1.01 and the left being 0.92 and the waveforms were triphasic bilaterally. he did get hold of 30-40 mm compression stockings but is unable to put these on. We will try and get him alternative compression stockings. 09/26/17- he is here in follow up evaluation of [name] right lower extremity ulcer;he is compliant in wearing compression stocking; ulcer almost epithelialized , anticipate healing next appointment Readmission: 11/17 point upon evaluation patient's wound currently that he is seeing [place] for today is [name] skin cancerous lesion that was cleared away by his dermatologist on the left medial calf region. He tells me that this is [name] very similar thing to what he had done previously in fact the last time he saw him in 2018 this was also what was going on at that point. Nonetheless  he feels that based on what he seeing currently that this is just having [name] lot of harder time healing although it is much closer to the surface than  what he is experienced in the past. He notes that the initial removal was in June 2022 which was this year this is now November and still has not closed. He does have some edema and definitely I think that there is some venous component to his slow healing here. Also think that we can do something better than Vaseline to try to help with getting this to clear up as quickly as possible. He does have [name] history of atrial fibrillation and is on Eliquis otherwise he really has no major medical problems that would affect wound healing. 09/07/2021 upon evaluation today patient actually appears to be doing significantly better after having wrapped him last week. Overall I think that this is making significant improvements at this time which is great news. I do not see any evidence of infection which is great news as well. No fevers, chills, nausea, vomiting, or diarrhea. 09/14/2021 upon evaluation today patient appears to be doing well with regard to his leg ulcer. He has been tolerating the dressing changes and overall I think that he is making excellent progress. I do not see any signs of active infection at this time. 09/21/2021 upon evaluation today patient actually appears to be making good progress with regard to his wound this is again measuring smaller today no debridement seems to be necessary. We have been using [name] silver collagen dressing and I think that is doing an awesome job. 09/28/2021 upon evaluation today patient appears to be doing well with regard to his leg currently. I do not see any signs of active infection at this time which is great news. No fevers, chills, nausea, vomiting, or diarrhea. I think this wound is very close to complete resolution. 10/12/2021 upon evaluation today patient actually appears to be doing awesome in regard to his leg ulcer. In fact this appears to be completely healed based on what I am seeing currently. I do not see any evidence of active infection locally nor  systemically at this time which is also great news. No fevers, chills, nausea, vomiting, or diarrhea. Readmission: 12/07/2021 upon evaluation today patient presents for readmission here in the clinic. He was discharged on 10/12/2021 is completely healed. Unfortunately this has reopened at this point and he is having continual issues with new blisters over both lower extremities. This is even worse than what we previously saw. Nonetheless we did actually check his ABIs today and it did reveal that his ABIs were 0.55 on the left and 0.57 on the right. Subsequently this is [name] definite change from his last arterial study which showed that he did have good blood flow at 1.01 on the right and 0.92 on the left and that was right at the beginning of 2019. Nonetheless based on what we see currently I do think he tolerated the 3 layer compression wrap but I do believe that we probably need to get him tested for his arterial flow in order to see where things stand and if there is something we can do there that would help prevent this from continue to be an ongoing issue. He did not utilize compression socks in the interim from when he was last here till this time. That something is probably going to need lifelong going forward as well. 3/9; patient presents for follow-up. He has no issues or complaints  today. He tolerated the compression wrap well. He had ABIs with TBI's done. He denies signs of infection. 12/21/2021 upon evaluation today patient appears to be doing well with regard to the wounds on his legs. Both are showing signs of significant improvement which is great news although I do believe some sharp debridement would be of benefit here as well. 12/28/2021 upon evaluation today patient appears to be doing well with regard to his wounds. Everything is showing signs of excellent improvement which I am very pleased about. I think that we are headed in the right direction here. Fortunately there does not appear  to be any evidence of infection which is great news there is [name] little bit of hypergranulation. 01/04/2022 upon evaluation today patient appears to be doing well with regard to his wounds 2 of them are healed 1 is almost so and the other 1 is significantly better. Overall I am extremely pleased with where we stand and I think that he is making excellent progress here. I do not see any evidence of active infection locally nor systemically at this time. 01-16-2022 upon evaluation today patient's wound on the left leg is showing signs of doing quite well. Has not completely cleared at this point but it is much improved. Fortunately I do not see any signs of infection at this time. No fevers, chills, nausea, vomiting, or diarrhea. 01-23-2022 upon evaluation today patient's wound of the left leg actually appears to be pretty much completely healed which is great news. I do not see any signs of active infection locally or systemically which is excellent. With that being said on the right leg what wound is measuring smaller the other 1 is [name] new wound that just showed up fortunately its not too bad. Has been using Xeroform here and that seems to be doing decently well which is great news. Unfortunately his blood pressure is significantly high we gave him the readings for the past 4-5 visits as well as [name] recommendation to make an appointment to go discuss this with his primary care provider patient states that he is going to look into doing this. 01-30-2022 upon evaluation today patient appears to be doing well with regard to his left leg everything appears to be healed. On the right leg the more anterior wound is healed the more medial wound that I been concerned about [name] possible skin cancer unfortunately still does not look great to me. I do believe that we should probably do [name] biopsy I have talked about it with him [name] few times I think though it is probably time to go ahead and do this at this point. 02-09-2022  upon evaluation today patient appears to be doing well with regard to his legs. On the left this appears to be completely healed. On the right he does have 2 areas and be perfectly honest one of them is [name] skin cancer that he is going to the Mohs surgery clinic for the other seems to be healing nicely. Readmission: [name], [name] ([id number]) [id number] Page 9 of 12 08-02-2022 upon evaluation today patient appears for reevaluation here in our clinic concerning issues that he has been having with wounds over the bilateral lower extremities. I last saw him in May 2023 and at that point we had him completely healed. Unfortunately he is tells me this has broken down to some degree since that point. 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 He tells me this 1 seems to just be getting bigger and bigger not improving. Fortunately he is not having any significant pain which is good news he does have quite [name] bit of swelling and he tells me that his fluid pills are not recommended for him to take daily but just in 3-day intervals here and there. 08-09-2022 upon evaluation today patient appears to be doing still somewhat poorly in regard to his legs although in general he does not appear to be feeling as good as he has been. Fortunately there does not appear to be any signs of infection which is good news. With that being said he is having some issues here with having and overall poor feeling in general which again is good I think going to be the biggest complicating factor. He actually seems to be coughing I do not hear any wheezing right now I did listen to his chest he did not have good airflow down low however makes me suspicious for bronchitis or even possibly pneumonia which  could be part of what is going on here as well. Fortunately I do not see any evidence of anything worsening in regard to his legs but I definitely believe that he needs to continue with the compression wraps he took them off yesterday to shower has not had anything on for 24 hours that is why his legs are so swollen today. With regard to his pathology report I did review that it showed some squamous abnormality but no signs of distinct carcinoma. With that being said it was saying that it could be adjacent to [name] squamous cell carcinoma nonetheless my suggestion is can be that we have the patient take copy of this report and give it to his Mohs surgeon in order for them to see if there is anything they feel like needs to be done further. With that being said right now I feel like the primary thing is going to be for [place] to try to get his swelling down and keep that down into that hand since he is having so much drainage I believe we can have to bring him in for dressing changes twice [name] week doing [name] nurse visit on Mondays. 11/9; since the patient was last here he spent the night in the emergency room he received IV Lasix. Also received antibiotics although he was not discharged on either 1 of these. He also saw his cardiology office who put him on regular Lasix 20 mg [previously on as needed Lasix 20 mg]. Per our intake nurse the swelling in his legs is remarkably better but he still has bilateral lower extremity wounds. He still has wounds on the bilateral lower extremities most problematically on the left lateral calf. He has been using silver alginate under 3 layer compression. 08-23-2022 upon evaluation today patient appears to be doing much better than the last time I saw him 2 weeks ago. At that point I was very concerned about how he was doing he did see Dr. Sherrie Mustache his primary care provider they got him on some blood pressure medication in general his color and overall appearance looks to be doing  much improved compared to the last time I saw him. 09-04-2022 upon evaluation today patient appears to be doing well currently in regard to his wounds. Everything is showing signs of improvement which  is great news. Fortunately there does not appear to be any signs of active infection locally or systemically at this time. No fevers, chills, nausea, vomiting, or diarrhea. 09-10-2022 upon evaluation today patient appears to be doing better in regard to his wounds although the [facility] was extremely stuck to the wound bed. Fortunately there does not appear to be any signs of infection locally or systemically at this time which is great news. No fevers, chills, nausea, vomiting, or diarrhea. 09-17-2022 upon evaluation today patient appears to be doing well currently in regard to his wounds in general. The right leg actually showing signs of excellent improvement and very pleased with where things stand in that regard. Fortunately I do not see any evidence of infection locally or systemically at this time which is great news. No fevers, chills, nausea, vomiting, or diarrhea. 09-24-2022 upon evaluation today patient appears to be doing well currently in regard to his wounds. Things look to be doing quite well. 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 He is seeing his dermatologist tomorrow in that regard. With that being said other than that however he seems to really be making some pretty good progress here which is good news. No fevers, chills, nausea, vomiting, or diarrhea. 12/26; the patient has 2 open wounds remaining on the left leg. One is on the left anterior mid tibia and the other is on the right lateral knee just outside of the popliteal fossa. 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 The patient has been to see dermatology  Dr. Adolphus Birchwood who apparently is making him [name] referral to the [facility]. He does not yet have an appointment 10-12-2022 upon evaluation today patient appears to be doing well currently in regard to his wound. He has been tolerating the dressing changes without complication and overall feel like we are headed in the right direction. Fortunately I do not see any signs of infection locally or systemically at this time which is great news. No fevers, chills, nausea, vomiting, or diarrhea. 10-23-2022 upon evaluation today patient appears to be doing well currently in regard to his wound. He has been tolerating the dressing changes without complication. Fortunately there does not appear to be any signs of active infection locally nor systemically which is great news and overall I am extremely pleased with where we stand currently. No fevers, chills, nausea, vomiting, or diarrhea. 10-26-2022 upon evaluation today patient appears to be doing well currently in regard to his wounds. Everything is showing signs of improvement and this is great news. Fortunately I see no evidence of active infection systemically. He does seem to be doing much better in regard to the local infection in regard to his leg. The smell is also greatly improved. Overall I am extremely happy with where we stand today. This is after just [name] few days with the antibiotic on board. 11-05-2022 upon evaluation today patient appears to be doing well currently in regard to his wounds although the wound where they performed the Mohs surgery does look [name] little bit hyper granulated I think switching to [facility] may be better for him. He voiced understanding. Fortunately there does not appear to be any evidence of active infection locally nor systemically at this time. 11-12-2022 upon evaluation today patient appears to be doing better in regard to  both wounds he has been tolerating the dressing changes without complication. There is  no signs of infection and in general I think you are doing quite well. No fevers, chills, nausea, vomiting, or diarrhea. 11-20-2022 upon evaluation today patient appears to be doing well currently in regard to his wounds. He has been tolerating the dressing changes without complication. Fortunately there does not appear to be any signs of infection at this time. No fevers, chills, nausea, vomiting, or diarrhea. 11-27-2022 upon evaluation today patient appears to be doing somewhat poorly in regard to his leg in general he has [name] lot of areas where he looks like he had some spots that popped up. There with regard to new possible blisters. In general I am actually very concerned about the fact that the wrap may be causing some irritation here. I think that we can try to not do the wrap for 1 week, and given the prescription for mupirocin ointment which I will send into the pharmacy for him. 12-04-2022 upon evaluation today patient appears to be doing well currently in regard to his wounds in fact he appears to be pretty much completely healed based on what I am seeing at this point. I do not see any signs of active infection locally nor systemically at this time which is great news. No fevers, chills, nausea, vomiting, or diarrhea. 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 I discussed that with him today he is aware of the situation.-2024 upon evaluation patient presents for readmission here in the clinic actually last saw him December 04, 2022. With that being said previously his dermatologist had asked that if there was something the need to be addressed from [name] dermatology standpoint and specifically biopsy that we make referral back to them to allow them to do it which I am definitely happy to do. 01-31-2023 upon evaluation today patient appears to  be doing well currently in regard to his wound in fact this looks better he did see Dr. Adolphus Birchwood his dermatologist yesterday and they did perform [name] biopsy. With that being said he actually looks like he is doing much better Dr. Adolphus Birchwood feels like this may not [name], [name] ([id number]) [id number] Page 10 of 12 be [name] cancerous lesion which will be very good news at the same time I definitely wanted to make sure especially considering his history and the way this looks when we saw him last week. Nonetheless I am extremely pleased with the fact that he is doing so much better at this point. 02-07-2023 upon evaluation today patient appears to be doing well currently in regard to his wound from the standpoint of size it has not gotten any larger. With that being said he does have some issues here still with what appears to be potentially some infection. Wound does not look quite as good as it did last week. We are still waiting on the results for the pathology. I did put [name] call into dermatology but I have not heard anything back from them as of yet. 02-14-2023 upon evaluation today patient appears to be doing well with regard to his wound infection is definitely under control and looks much better. Fortunately I do not see any signs of systemic infection with anyone locally I think this is improved greatly. 02-19-2023 upon evaluation today patient appears to be  doing well currently in regard to his wound which is actually measuring smaller and looking much better. Fortunately I do not see any evidence of active infection locally nor systemically which is great news and overall I am extremely pleased with where we stand today. 02-26-2023 upon evaluation today patient appears to be doing well currently in regard to his wound. He is actually been tolerating the dressing changes without complication. Fortunately there does not appear to be any signs of active infection locally nor  systemically which is great news and overall I am extremely pleased with where we stand currently. 03-12-2023 upon evaluation today patient's wound actually showed signs of excellent improvement. I am very pleased with where we stand I do believe that we are making good progress here. I do not see any signs of active infection. 03-19-2023 upon evaluation today patient actually appears to be making excellent progress in regard to his leg and getting this closed. In fact is just [name] very small area that is actually open at this point. I am actually very pleased with what we are seeing today. 03-26-2023 upon evaluation today patient appears to be doing well currently in regard to his leg which is actually showing signs of being completely healed. Fortunately I do not see any signs of active infection locally or systemically which is great news and in general I do believe that we are moving in the right direction here. Readmission: 05-16-2023 upon evaluation today patient presents for reevaluation here in the clinic concerning [name] wound on the left anterior/lateral lower extremity. This is similar to where the wound was last time I saw him but not exactly the same. Fortunately there does not appear to be any signs of active infection at this time which is great news. The patient's past medical history really has not changed significantly since last time I saw him. 05-24-2023 upon evaluation patient actually appears to be doing excellent in regard to his leg ulcer. He has been tolerating the dressing changes without complication in general I do feel like there were making excellent headway towards complete closure. I do not see any signs of active infection at this point. 8/29; left lateral lower leg in the setting of chronic venous insufficiency. We have been using Hydrofera Blue under Urgo K2. Wounds are making nice improvements 06-20-2023 upon evaluation today patient appears to be doing well currently in regard  to his leg ulcer. This is actually showing signs of being significantly smaller compared even last week's nurse visit this looks much better. Fortunately I do not see any need for sharp debridement he seems to be doing quite well. 06-27-2023 upon evaluation today patient appears to be doing well currently in regard to his wound. He is tolerating the dressing changes without complication. There is [name] little bit of hypergranulation on the use of silver nitrate today to help keep this under control. Fortunately I do not see any evidence of active infection locally or systemically which is great news. 07-04-2023 upon evaluation today patient appears to be doing well currently in regard to his wound. He has been tolerating the dressing changes without complication. Fortunately I do not see any signs of active infection locally or systemically which is great news and in general I do believe that he is making really good headway towards complete closure. This looks much better after starting the antibiotics. 07-09-2023 upon evaluation today patient appears to be doing excellent in regard to his leg which is actually significantly improved. I am  very pleased with where we stand and I do believe that he is making really good headway towards getting this closed in fact it pretty much appears to be almost closed today there is just [name] very tiny area that I think needs 1 week to toughen up before we cut him loose completely. 07-16-2023 upon evaluation today patient appears to be doing well currently in regard to his wounds. In fact everything is completely healed and looks to be doing great. Fortunately I do not see any signs of active infection at this time which is great news. No fevers, chills, nausea, vomiting, or diarrhea. 07-30-2023 upon evaluation today patient unfortunately has [name] breakdown of the wound on the lateral aspect of his leg. Unfortunately I am [name] little concerned about the overall appearance of the leg  I feel like he may require [name] biopsy to further evaluate the situation. I discussed that with the patient today. With that being said I previously been asked by Dr. Adolphus Birchwood not to perform any of the biopsies myself but to let them see it prior to the biopsy. For that reason I would go ahead and see about making referral to Dr. Durene Cal office to get this scheduled as quickly as possible is my hope. We reached out to them have not heard back as of yet. 08-08-2023 upon evaluation today patient's wound actually showed signs of doing really about the same with regard to his wound. There does not appear to be any signs of active infection at this time which is good news with that being said they did not biopsy this at the office today when he went to dermatology. This is unfortunate as I thought that we can do that now. I will get [name] try to see if we get in touch with Dr. Durene Cal office if were not able to get this done before I see him next week, going to just do the biopsy myself just to make sure we know what we are dealing with patient voiced understanding he is in agreement with the plan. 08-15-2023 upon evaluation today patient appears to be doing pretty well currently in regard to his wound which is okay he did have [name] biopsy today apparently they had [name] hard time getting it to stop bleeding that had [name] pressure dressing on pretty tightly the good news is hemostasis has been achieved. With that being said the patient tells me that he unfortunately when they unwrapped him had several areas that were somewhat pussy and opened in regard to his leg I think he may have [name] bit of an infection ensuing again already. 08-22-2023 upon evaluation today patient's leg appears to be somewhat better in regards to the infection but unfortunately is still having some issues here with the wound the good news is Dr. Durene Cal report did come to me and the patient did have [name] staph infection but otherwise did not have any signs  of cancer pathology. 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 Objective Constitutional Well-nourished and well-hydrated in no acute distress. [name], [name] ([id number]) [id number] Page 11 of 12 Vitals Time Taken: 8:10 AM, Height: 75 in, Weight: 220 lbs, BMI: 27.5, Temperature: 97.6 F, Pulse: 80 bpm, Respiratory Rate: 18 breaths/min, Blood Pressure: 148/87 mmHg. Respiratory normal breathing without difficulty. Psychiatric this patient is able to make decisions and demonstrates good insight into disease process. Alert and Oriented x 3. pleasant and cooperative. General Notes: Upon inspection patient's wound bed actually showed signs  of good granulation and epithelization at this point. Fortunately there does not appear to be any signs of active infection at this time. No fevers, chills, nausea, vomiting, or diarrhea. Integumentary (Hair, Skin) Wound #21R status is Open. Original cause of wound was Gradually Appeared. The date acquired was: 04/08/2023. The wound has been in treatment 14 weeks. The wound is located on the Left,Lateral Lower Leg. The wound measures 3.5cm length x 3cm width x 0.1cm depth; 8.247cm^2 area and 0.825cm^3 volume. There is [name] none present amount of drainage noted. There is large (67-100%) hyper - granulation within the wound bed. There is no necrotic tissue within the wound bed. 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 There was [name] Double Layer Compression Therapy Procedure  by [name] Post procedure Diagnosis Wound #21R: Same as Pre-Procedure Notes: patient tolerated treatment well. 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 An CHEM CAUT GRANULATION TISS procedure was performed by [name] Derry, PA-C. Post procedure Diagnosis Wound #21R: Same as Pre-Procedure Notes: one silver nitrate stick used for hypergranulation tissue Plan Follow-up Appointments: Return Appointment in 1 week. Nurse Visit as needed Bathing/ Shower/ Hygiene: May shower; gently cleanse wound with antibacterial soap, rinse and pat dry prior to dressing wounds Edema Control - Orders / Instructions: Elevate, Exercise Daily and Avoid Standing for Long Periods of Time. Elevate leg(s) parallel to the floor when sitting. Medications-Please add to medication list.: P.O. Antibiotics - continue Bactrim as directed WOUND #21R: - Lower Leg Wound Laterality: Left, Lateral Cleanser: Soap and Water 1 x Per Week/30 Days Discharge Instructions: Gently cleanse wound with antibacterial soap, rinse and pat dry prior to dressing wounds Cleanser: Wound Cleanser 1 x Per Week/30 Days Discharge Instructions: Wash your hands with soap and water. Remove old dressing, discard into plastic bag and place into trash. Cleanse the wound with Wound Cleanser prior to applying [name] clean dressing using gauze sponges, not tissues or cotton balls. Do not scrub or use excessive force. Pat dry using gauze sponges, not tissue or cotton balls. Topical: Triamcinolone Acetonide Cream, 0.1%, 15 (g) tube 1 x Per Week/30 Days Discharge Instructions: Apply to entire lower leg Prim Dressing: Hydrofera Blue Ready Transfer Foam, 2.5x2.5 (in/in) 1 x Per Week/30 Days ary Discharge Instructions: Apply Hydrofera Blue Ready to wound bed as directed Prim Dressing: Curad Oil Emulsion Dressing 3x3 (in/in) 1 x Per Week/30 Days ary Discharge Instructions: prior to HB Secondary Dressing:  Zetuvit Plus 4x8 (in/in) 1 x Per Week/30 Days Com pression Wrap: Urgo K2, two layer compression system, regular 1 x Per [address] [name], [name] ([id number]) [id number] Page 12 of 12 1. I would recommend based on what we are seeing that we have the patient going to continue to monitor for any signs of infection or worsening. I do believe the [facility] is doing [name] good job here. 2. I am going to recommend as well that the patient should continue with the oil emulsion dressing which I think is doing quite well currently. 3. I am also can recommend that we use the oil emulsion over the leg in general without the Hydrofera Blue overall the  majority of the leg. Based I do not want anything sticking in regard to using the triamcinolone to try to calm down the inflammation. We will see patient back for reevaluation in 1 week here in the clinic. If anything worsens or changes patient will contact our office for additional recommendations. Electronic Signature(s) Signed: 08/22/2023 9:02:30 AM By: [name] Derry PA-C Entered By: [name] on 08/22/2023 09:02:30 -------------------------------------------------------------------------------- SuperBill Details Patient Name: Date of Service: [name], [name] 08/22/2023 Medical Record Number: [id number] Patient Account Number: [id number] Date of Birth/Sex: Treating RN: April 24, 1946 (77 y.o. [name] Primary Care Provider: Mila [name] Other Clinician: Betha [name] Referring Provider: Treating Provider/Extender: [name] in Treatment: 14 Diagnosis Coding ICD-10 Codes Code Description I89.0 Lymphedema, not elsewhere classified 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 L97.822 Non-pressure chronic ulcer of other part of left lower leg with fat layer exposed I73.89 Other specified peripheral vascular diseases I48.0  Paroxysmal atrial fibrillation I10 Essential (primary) hypertension Facility Procedures : CPT4 Code: [id number] Description: 17250 - CHEM CAUT GRANULATION TISS ICD-10 Diagnosis Description L97.822 Non-pressure chronic ulcer of other part of left lower leg with fat layer expose Modifier: d Quantity: 1 Physician Procedures : CPT4 Code Description Modifier [id number] 17250 - WC PHYS CHEM CAUT GRAN TISSUE ICD-10 Diagnosis Description L97.822 Non-pressure chronic ulcer of other part of left lower leg with fat layer exposed Quantity: 1 Electronic Signature(s) Signed: 08/22/2023 9:02:42 AM By: [name] Derry PA-C Entered By: [name] on 08/22/2023 09:02:42

## 2023-08-23 NOTE — Progress Notes (Signed)
ZANDEN, BURGERS R ([id number]) [id number] Page 1 of 9 Visit Report for 08/22/2023 Arrival Information Details Patient Name: Date of Service: [name] 08/22/2023 8:15 A M Medical Record Number: [id number] Patient Account Number: [id number] Date of Birth/Sex: Treating RN: 1946-07-02 (77 y.o. [name] Primary Care Adaiah Morken: [name] Other Clinician: Betha [name] Referring Kayann Maj: Treating Tycho Cheramie/Extender: [name] in Treatment: 14 Visit Information History Since Last Visit All ordered tests and consults were completed: No Patient Arrived: Ambulatory Added or deleted any medications: No Arrival Time: 08:06 Any new allergies or adverse reactions: No Transfer Assistance: None Had a fall or experienced change in No Patient Identification Verified: Yes activities of daily living that may affect Secondary Verification Process Completed: Yes risk of falls: Patient Requires Transmission-Based Precautions: No Signs or symptoms of abuse/neglect since last visito No Patient Has Alerts: No Hospitalized since last visit: No Implantable device outside of the clinic excluding No cellular tissue based products placed in the center since last visit: Has Dressing in Place as Prescribed: Yes Has Compression in Place as Prescribed: Yes Pain Present Now: No Electronic Signature(s) Signed: 08/22/2023 5:18:23 PM By: Betha [name] Entered By: Betha [name] on 08/22/2023 08:09:55 -------------------------------------------------------------------------------- Clinic Level of Care Assessment Details Patient Name: Date of Service: [name] [name] 08/22/2023 8:15 A M Medical Record Number: [id number] Patient Account Number: [id number] Date of Birth/Sex: Treating RN: Jul 10, 1946 (77 y.o. [name] Primary Care Shaneka Efaw: [name] Other Clinician: Betha [name] Referring [name]: Treating  [name]/Extender: [name] in Treatment: 14 Clinic Level of Care Assessment Items TOOL 1 Quantity Score []  - 0 Use when EandM and Procedure is performed on INITIAL visit ASSESSMENTS - Nursing Assessment / Reassessment []  - 0 General Physical Exam (combine w/ comprehensive assessment (listed just below) when performed on new pt. [address] ROLLEN, TERRONEZ R ([id number]) [id number] Page 2 of 9 []  - 0 Comprehensive Assessment (HX, ROS, Risk Assessments, Wounds Hx, etc.) ASSESSMENTS - Wound and Skin Assessment / Reassessment []  - 0 Dermatologic / Skin Assessment (not related to wound area) ASSESSMENTS - Ostomy and/or Continence Assessment and Care []  - 0 Incontinence Assessment and Management []  - 0 Ostomy Care Assessment and Management (repouching, etc.) PROCESS - Coordination of Care []  - 0 Simple Patient / Family Education for ongoing care []  - 0 Complex (extensive) Patient / Family Education for ongoing care []  - 0 Staff obtains Chiropractor, Records, T Results / Process Orders est []  - 0 Staff telephones HHA, Nursing Homes / Clarify orders / etc []  - 0 Routine Transfer to another Facility (non-emergent condition) []  - 0 Routine Hospital Admission (non-emergent condition) []  - 0 New Admissions / Manufacturing engineer / Ordering NPWT Apligraf, etc. , []  - 0 Emergency Hospital Admission (emergent condition) PROCESS - Special Needs []  - 0 Pediatric / Minor Patient Management []  - 0 Isolation Patient Management []  - 0 Hearing / Language / Visual special needs []  - 0 Assessment of Community assistance (transportation, [name], etc.) []  - 0 Additional assistance / Altered mentation []  - 0 Support Surface(s) Assessment (bed, cushion, seat, etc.) INTERVENTIONS - Miscellaneous []  - 0 External ear exam []  - 0 Patient Transfer (multiple staff / Nurse, adult / Similar devices) []  - 0 Simple Staple / Suture removal (25 or  less) []  - 0 Complex Staple / Suture removal (26 or more) []  - 0 Hypo/Hyperglycemic Management (do not check if billed separately) []  - 0 Ankle / Brachial  Index (ABI) - do not check if billed separately Has the patient been seen at the hospital within the last three years: Yes Total Score: 0 Level Of Care: ____ Electronic Signature(s) Signed: 08/22/2023 5:18:23 PM By: Betha [name] Entered By: Betha [name] on 08/22/2023 08:38:46 -------------------------------------------------------------------------------- Compression Therapy Details Patient Name: Date of Service: [name], [name] 08/22/2023 8:15 A M Medical Record Number: [id number] Patient Account Number: [id number] Date of Birth/Sex: Treating RN: 1946-02-09 (77 y.o. [name] Primary Care Naksh Radi: [name] Other Clinician: Bezalel, Senn R ([id number]) [id number] Page 3 of 9 Referring Aoife Bold: Treating Domingue Coltrain/Extender: [name] in Treatment: 14 Compression Therapy Performed for Wound Assessment: Wound #21R Left,Lateral Lower Leg Performed By: Farrel Gordon, Angie, Compression Type: Double Layer Post Procedure Diagnosis Same as Pre-procedure Notes patient tolerated treatment well Electronic Signature(s) Signed: 08/22/2023 5:18:23 PM By: Betha [name] Entered By: Betha [name] on 08/22/2023 08:38:02 -------------------------------------------------------------------------------- Encounter Discharge Information Details Patient Name: Date of Service: [name], [name] 08/22/2023 8:15 A M Medical Record Number: [id number] Patient Account Number: [id number] Date of Birth/Sex: Treating RN: [birth year] (77 y.o. [name] Primary Care Keshia Weare: [name] Other Clinician: Betha [name] Referring [name]: Treating [name]/Extender: [name] in Treatment: 14 Encounter Discharge Information  Items Discharge Condition: Stable Ambulatory Status: Ambulatory Discharge Destination: Home Transportation: Private Auto Accompanied By: self Schedule Follow-up Appointment: Yes Clinical Summary of Care: Electronic Signature(s) Signed: 08/22/2023 5:18:23 PM By: Betha [name] Entered By: Betha [name] on 08/22/2023 08:57:45 -------------------------------------------------------------------------------- Lower Extremity Assessment Details Patient Name: Date of Service: [name] Feliz Beam LBERT R. 08/22/2023 8:15 A M Medical Record Number: [id number] Patient Account Number: [id number] Date of Birth/Sex: Treating RN: [birth year] (77 y.o. [name] Primary Care Marycruz Boehner: [name] Other Clinician: Betha [name] Referring Ramiz Turpin: Treating Atiyah Bauer/Extender: [name] in Treatment: [address] [place] R ([id number]) [id number] Page 4 of 9 Edema Assessment Assessed: [Left: Yes] [Right: No] Edema: [Left: Ye] [Right: s] Calf Left: Right: Point of Measurement: 37 cm From Medial Instep 35.4 cm Ankle Left: Right: Point of Measurement: 12 cm From Medial Instep 23 cm Vascular Assessment Pulses: Dorsalis Pedis Palpable: [Left:Yes] Extremity colors, hair growth, and conditions: Extremity Color: [Left:Normal] Hair Growth on Extremity: [Left:Yes] Temperature of Extremity: [Left:Warm > 3 seconds] Toe Nail Assessment Left: Right: Thick: No Discolored: No Deformed: No Improper Length and Hygiene: No Electronic Signature(s) Signed: 08/22/2023 4:29:52 PM By: Angelina Pih Signed: 08/22/2023 5:18:23 PM By: Betha [name] Entered By: Betha [name] on 08/22/2023 08:22:35 -------------------------------------------------------------------------------- Multi Wound Chart Details Patient Name: Date of Service: [name] [name], [name] 08/22/2023 8:15 A M Medical Record Number: [id number] Patient Account Number: [id number] Date of  Birth/Sex: Treating RN: [birth year] (77 y.o. [name] Primary Care Lawernce Earll: [name] Other Clinician: Betha [name] Referring Scottie Metayer: Treating Henri Guedes/Extender: [name] in Treatment: 14 Vital Signs Height(in): 75 Pulse(bpm): 80 Weight(lbs): 220 Blood Pressure(mmHg): 148/87 Body Mass Index(BMI): 27.5 Temperature(F): 97.6 Respiratory Rate(breaths/min): 18 [21R:Photos:] [[name]:[name]] Left, Lateral Lower Leg [name] [name] Wound Location: Gradually Appeared [name] [name] Wounding Event: Vasculitis [name] [name] Primary Etiology: Arrhythmia, Hypertension, Gout [name] [name] Comorbid History: 04/08/2023 [name] [name] Date Acquired: 14 [name] [name] Weeks of Treatment: Open [name] [name] Wound Status: No [name] [name] Wound Recurrence: 3.5x3x0.1 [name] [name] Measurements L x W x D (cm) 8.247 [name] [name] A (cm) : rea 0.825 [name] [name] Volume (cm) : 40.00% [name] [name] % Reduction in A rea:  40.00% [name] [name] % Reduction in Volume: Full Thickness Without Exposed [name] [name] Classification: Support Structures None Present [name] [name] Exudate Amount: Large (67-100%) [name] [name] Granulation Amount: None Present (0%) [name] [name] Necrotic Amount: Fascia: No [name] [name] Exposed Structures: Fat Layer (Subcutaneous Tissue): No Tendon: No Muscle: No Joint: No Bone: No Large (67-100%) [name] [name] Epithelialization: Treatment Notes Electronic Signature(s) Signed: 08/22/2023 5:18:23 PM By: Betha [name] Entered By: Betha [name] on 08/22/2023 08:22:39 -------------------------------------------------------------------------------- Multi-Disciplinary Care Plan Details Patient Name: Date of Service: [name], [name] 08/22/2023 8:15 A M Medical Record Number: [id number] Patient Account Number: [id number] Date of Birth/Sex: Treating RN: 11/18/1945 (77 y.o. [name] Primary Care Derenda Giddings: [name] Other Clinician: Betha [name] Referring Charlina Dwight: Treating Castella Lerner/Extender: [name] in Treatment: 14 Active Inactive Wound/Skin Impairment Nursing Diagnoses: Knowledge deficit related to ulceration/compromised skin integrity Goals: Patient/caregiver will verbalize understanding of skin care regimen Date Initiated: 05/16/2023 Target Resolution Date: 09/07/2023 Goal Status: Active Ulcer/skin breakdown will have a volume reduction of 30% by week 4 Date Initiated: 05/16/2023 Date Inactivated: 06/20/2023 Target Resolution Date: 06/16/2023 Goal Status: Met Ulcer/skin breakdown will have a volume reduction of 50% by week 8 AXL, JAROSZ R ([id number]) [id number] Page 6 of 9 Date Initiated: 05/16/2023 Date Inactivated: 07/17/2023 Target Resolution Date: 07/16/2023 Goal Status: Met Ulcer/skin breakdown will have a volume reduction of 80% by week 12 Date Initiated: 05/16/2023 Date Inactivated: 07/17/2023 Target Resolution Date: 08/16/2023 Goal Status: Met Ulcer/skin breakdown will heal within 14 weeks Date Initiated: 05/16/2023 Date Inactivated: 07/17/2023 Target Resolution Date: 09/15/2023 Goal Status: Met Interventions: Assess patient/caregiver ability to obtain necessary supplies Assess patient/caregiver ability to perform ulcer/skin care regimen upon admission and as needed Assess ulceration(s) every visit Notes: Electronic Signature(s) Signed: 08/22/2023 4:29:52 PM By: Angelina Pih Signed: 08/22/2023 5:18:23 PM By: Betha [name] Entered By: Betha [name] on 08/22/2023 08:39:00 -------------------------------------------------------------------------------- Pain Assessment Details Patient Name: Date of Service: [name], [name] 08/22/2023 8:15 A M Medical Record Number: [id number] Patient Account Number: [id number] Date of Birth/Sex: Treating RN: 09-24-1946 (77 y.o. [name] Primary Care Kursten Kruk: [name] Other Clinician: Betha [name] Referring Shakiyla Kook: Treating Armonte Tortorella/Extender: [name] in Treatment: 14 Active Problems Location of Pain Severity and Description of Pain Patient Has Paino No Site Locations Pain Management and Medication Current Pain Management: Electronic Signature(s) Signed: 08/22/2023 4:29:52 PM By: Angelina Pih Signed: 08/22/2023 5:18:23 PM By: Verl Dicker ([id number]) [id number] Page 7 of 9 Entered By: Betha [name] on 08/22/2023 08:12:03 -------------------------------------------------------------------------------- Patient/Caregiver Education Details Patient Name: Date of Service: [name] 11/14/2024andnbsp8:15 A M Medical Record Number: [id number] Patient Account Number: [id number] Date of Birth/Gender: Treating RN: 1945-12-29 (78 y.o. [name] Primary Care Physician: [name] Other Clinician: Betha [name] Referring Physician: Treating Physician/Extender: [name] in Treatment: 14 Education Assessment Education Provided To: Patient Education Topics Provided Wound/Skin Impairment: Handouts: Other: continue wound care as directed Methods: Explain/Verbal Responses: State content correctly Electronic Signature(s) Signed: 08/22/2023 5:18:23 PM By: Betha [name] Entered By: Betha [name] on 08/22/2023 08:56:18 -------------------------------------------------------------------------------- Wound Assessment Details Patient Name: Date of Service: [name], [name] 08/22/2023 8:15 A M Medical Record Number: [id number] Patient Account Number: [id number] Date of Birth/Sex: Treating RN: [birth year] (77 y.o. [name] Primary Care Lacey Dotson: [name] Other Clinician: Betha [name] Referring Vernis Eid: Treating Rush Salce/Extender: [name] in Treatment: 14 Wound Status Wound Number: 21R  Primary Etiology: Vasculitis Wound Location: Left, Lateral Lower Leg Wound Status: Open Wounding  Event: Gradually Appeared Comorbid History: Arrhythmia, Hypertension, Gout Date Acquired: 04/08/2023 Weeks Of Treatment: 14 Clustered Wound: No Photos GOPAL, MELODY R ([id number]) [id number] Page 8 of 9 Wound Measurements Length: (cm) 3.5 Width: (cm) 3 Depth: (cm) 0.1 Area: (cm) 8.247 Volume: (cm) 0.825 % Reduction in Area: 40% % Reduction in Volume: 40% Epithelialization: Large (67-100%) Wound Description Classification: Full Thickness Without Exposed Support Structures Exudate Amount: None Present Foul Odor After Cleansing: No Slough/Fibrino No Wound Bed Granulation Amount: Large (67-100%) Exposed Structure Granulation Quality: Hyper-granulation Fascia Exposed: No Necrotic Amount: None Present (0%) Fat Layer (Subcutaneous Tissue) Exposed: No Tendon Exposed: No Muscle Exposed: No Joint Exposed: No Bone Exposed: No Treatment Notes Wound #21R (Lower Leg) Wound Laterality: Left, Lateral Cleanser Soap and Water Discharge Instruction: Gently cleanse wound with antibacterial soap, rinse and pat dry prior to dressing wounds Wound Cleanser Discharge Instruction: Wash your hands with soap and water. Remove old dressing, discard into plastic bag and place into trash. Cleanse the wound with Wound Cleanser prior to applying a clean dressing using gauze sponges, not tissues or cotton balls. Do not scrub or use excessive force. Pat dry using gauze sponges, not tissue or cotton balls. Peri-Wound Care Topical Triamcinolone Acetonide Cream, 0.1%, 15 (g) tube Discharge Instruction: Apply to entire lower leg Primary Dressing Hydrofera Blue Ready Transfer Foam, 2.5x2.5 (in/in) Discharge Instruction: Apply Hydrofera Blue Ready to wound bed as directed Curad Oil Emulsion Dressing 3x3 (in/in) Discharge Instruction: prior to HB Secondary Dressing Zetuvit Plus 4x8 (in/in) Secured With Compression Wrap Urgo K2, two layer compression system, regular Compression  Stockings Add-Ons Electronic Signature(s) Signed: 08/22/2023 4:29:52 PM By: Angelina Pih Signed: 08/22/2023 5:18:23 PM By: Betha [name] Entered By: Betha [name] on 08/22/2023 08:20:45 Shawnee Knapp R ([id number]) [id number] Page 9 of 9 -------------------------------------------------------------------------------- Vitals Details Patient Name: Date of Service: [name] [name] 08/22/2023 8:15 A M Medical Record Number: [id number] Patient Account Number: [id number] Date of Birth/Sex: Treating RN: [birth year] (77 y.o. [name] Primary Care Addy Mcmannis: [name] Other Clinician: Betha [name] Referring Fay Swider: Treating Charod Slawinski/Extender: [name] in Treatment: 14 Vital Signs Time Taken: 08:10 Temperature (F): 97.6 Height (in): 75 Pulse (bpm): 80 Weight (lbs): 220 Respiratory Rate (breaths/min): 18 Body Mass Index (BMI): 27.5 Blood Pressure (mmHg): 148/87 Reference Range: 80 - 120 mg / dl Electronic Signature(s) Signed: 08/22/2023 5:18:23 PM By: Betha [name] Entered By: Betha [name] on 08/22/2023 08:11:59

## 2023-08-26 DIAGNOSIS — I10 Essential (primary) hypertension: Secondary | ICD-10-CM | POA: Diagnosis not present

## 2023-08-26 DIAGNOSIS — I7389 Other specified peripheral vascular diseases: Secondary | ICD-10-CM | POA: Diagnosis not present

## 2023-08-26 DIAGNOSIS — I890 Lymphedema, not elsewhere classified: Secondary | ICD-10-CM | POA: Diagnosis not present

## 2023-08-26 DIAGNOSIS — I482 Chronic atrial fibrillation, unspecified: Secondary | ICD-10-CM | POA: Diagnosis not present

## 2023-08-26 DIAGNOSIS — I480 Paroxysmal atrial fibrillation: Secondary | ICD-10-CM | POA: Diagnosis not present

## 2023-08-26 DIAGNOSIS — I87333 Chronic venous hypertension (idiopathic) with ulcer and inflammation of bilateral lower extremity: Secondary | ICD-10-CM | POA: Diagnosis not present

## 2023-08-26 DIAGNOSIS — Z7901 Long term (current) use of anticoagulants: Secondary | ICD-10-CM | POA: Diagnosis not present

## 2023-08-26 DIAGNOSIS — Z85828 Personal history of other malignant neoplasm of skin: Secondary | ICD-10-CM | POA: Diagnosis not present

## 2023-08-26 DIAGNOSIS — L97822 Non-pressure chronic ulcer of other part of left lower leg with fat layer exposed: Secondary | ICD-10-CM | POA: Diagnosis not present

## 2023-08-26 NOTE — Progress Notes (Addendum)
SUHAAS, GAUER [name] ([id number]) [id number] Page 1 of 3 Visit Report for 08/26/2023 Arrival Information Details Patient Name: Date of Service: [name] 08/26/2023 11:00 A M Medical Record Number: [id number] Patient Account Number: [id number] Date of Birth/Sex: Treating RN: 1945/12/03 (77 y.o. [name] Primary Care Miachel Nardelli: [name] Other Clinician: Referring Netha Dafoe: Treating Cashton Hosley/Extender: [name] in Treatment: 14 Visit Information History Since Last Visit Added or deleted any medications: No Patient Arrived: Ambulatory Any new allergies or adverse reactions: No Arrival Time: 11:30 Has Dressing in Place as Prescribed: Yes Accompanied By: self Has Compression in Place as Prescribed: Yes Transfer Assistance: None Pain Present Now: No Patient Identification Verified: Yes Secondary Verification Process Completed: Yes Patient Requires Transmission-Based Precautions: No Patient Has Alerts: No Electronic Signature(s) Signed: 08/26/2023 1:29:37 PM By: [name] MSN RN CNS WTA Entered By: [name] on 08/26/2023 13:29:36 -------------------------------------------------------------------------------- Compression Therapy Details Patient Name: Date of Service: [name] [name], [name] 08/26/2023 11:00 A M Medical Record Number: [id number] Patient Account Number: [id number] Date of Birth/Sex: Treating RN: 06/11/1946 (77 y.o. [name] Primary Care Jarrick Fjeld: [name] Other Clinician: Referring Winta Barcelo: Treating Demarques Pilz/Extender: [name] in Treatment: 14 Compression Therapy Performed for Wound Assessment: Wound #21R Left,Lateral Lower Leg Performed By: Clinician [name], RN Compression Type: Four Layer Electronic Signature(s) Signed: 08/26/2023 1:30:10 PM By: [name] MSN RN CNS WTA Entered By: [name] on 08/26/2023 13:30:10 [name] ([id number])  [id number] Page 2 of 3 -------------------------------------------------------------------------------- Encounter Discharge Information Details Patient Name: Date of Service: [name] 08/26/2023 11:00 A M Medical Record Number: [id number] Patient Account Number: [id number] Date of Birth/Sex: Treating RN: 1946-01-14 (77 y.o. [name] Primary Care Damani Kelemen: [name] Other Clinician: Referring Danyele Smejkal: Treating Quintessa Simmerman/Extender: [name] in Treatment: 14 Encounter Discharge Information Items Discharge Condition: Stable Ambulatory Status: Ambulatory Discharge Destination: Home Transportation: Private Auto Accompanied By: self Schedule Follow-up Appointment: Yes Clinical Summary of Care: Electronic Signature(s) Signed: 08/26/2023 1:30:55 PM By: [name] MSN RN CNS WTA Entered By: [name] on 08/26/2023 13:30:55 -------------------------------------------------------------------------------- Wound Assessment Details Patient Name: Date of Service: [name] [name], [name] 08/26/2023 11:00 A M Medical Record Number: [id number] Patient Account Number: [id number] Date of Birth/Sex: Treating RN: [birth year] (77 y.o. [name] Primary Care Stepfon Rawles: [name] Other Clinician: Referring Honesti Seaberg: Treating Tomorrow Dehaas/Extender: [name] in Treatment: 14 Wound Status Wound Number: 21R Primary Etiology: Vasculitis Wound Location: Left, Lateral Lower Leg Wound Status: Open Wounding Event: Gradually Appeared Date Acquired: 04/08/2023 Weeks Of Treatment: 14 Clustered Wound: No Wound Measurements Length: (cm) 3.5 Width: (cm) 3 Depth: (cm) 0.1 Area: (cm) 8.247 Volume: (cm) 0.825 % Reduction in Area: 40% % Reduction in Volume: 40% Wound Description Classification: Full Thickness Without Exposed Support Structures [name], [name] ([id number]) Exudate Amount: None  Present [phone] Page 3 of 3 Electronic Signature(s) Signed: 08/30/2023 12:46:12 PM By: [name] MSN RN CNS WTA Entered By: [name] on 08/26/2023 13:29:51

## 2023-08-27 NOTE — Progress Notes (Signed)
HADY, BRINER [name] ([id number]) [id number] Page 1 of 2 Visit Report for 08/26/2023 Physician Orders Details Patient Name: Date of Service: [name] 08/26/2023 11:00 A M Medical Record Number: [id number] Patient Account Number: [id number] Date of Birth/Sex: Treating RN: 02-07-1946 (77 y.o. [name] Primary Care Provider: [name] Other Clinician: Referring Provider: Treating Provider/Extender: [name] in Treatment: 14 The following information was scribed by: [name] The information was scribed for: Allen Derry Verbal / Phone Orders: No Diagnosis Coding Follow-up Appointments Return Appointment in 1 week. Nurse Visit as needed Bathing/ Shower/ Hygiene May shower; gently cleanse wound with antibacterial soap, rinse and pat dry prior to dressing wounds Edema Control - Orders / Instructions Elevate, Exercise Daily and A void Standing for Long Periods of Time. Elevate leg(s) parallel to the floor when sitting. Medications-Please add to medication list. ntibiotics - continue Bactrim as directed P.O. A Wound Treatment Wound #21R - Lower Leg Wound Laterality: Left, Lateral Cleanser: Soap and Water 1 x Per Week/30 Days Discharge Instructions: Gently cleanse wound with antibacterial soap, rinse and pat dry prior to dressing wounds Cleanser: Wound Cleanser 1 x Per Week/30 Days Discharge Instructions: Wash your hands with soap and water. Remove old dressing, discard into plastic bag and place into trash. Cleanse the wound with Wound Cleanser prior to applying a clean dressing using gauze sponges, not tissues or cotton balls. Do not scrub or use excessive force. Pat dry using gauze sponges, not tissue or cotton balls. Topical: Triamcinolone Acetonide Cream, 0.1%, 15 (g) tube 1 x Per Week/30 Days Discharge Instructions: Apply to entire lower leg Prim Dressing: Hydrofera Blue Ready Transfer Foam, 2.5x2.5 (in/in) 1 x  Per Week/30 Days ary Discharge Instructions: Apply Hydrofera Blue Ready to wound bed as directed Prim Dressing: Curad Oil Emulsion Dressing 3x3 (in/in) ary 1 x Per Week/30 Days Discharge Instructions: prior to HB Secondary Dressing: Zetuvit Plus 4x8 (in/in) 1 x Per Week/30 Days Compression Wrap: Urgo K2, two layer compression system, regular 1 x Per Week/30 Days Electronic Signature(s) Signed: 08/26/2023 1:30:34 PM By: [name] MSN RN CNS WTA Signed: 08/27/2023 4:00:09 PM By: Allen Derry PA-C Entered By: [name] on 08/26/2023 10:30:33 [name] ([id number]) [id number] Page 2 of 2 -------------------------------------------------------------------------------- SuperBill Details Patient Name: Date of Service: [name] 08/26/2023 Medical Record Number: [id number] Patient Account Number: [id number] Date of Birth/Sex: Treating RN: [birth year] (77 y.o. 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

## 2023-08-29 ENCOUNTER — Encounter: Payer: Medicare PPO | Admitting: Physician Assistant

## 2023-08-29 DIAGNOSIS — I7389 Other specified peripheral vascular diseases: Secondary | ICD-10-CM | POA: Diagnosis not present

## 2023-08-29 DIAGNOSIS — Z85828 Personal history of other malignant neoplasm of skin: Secondary | ICD-10-CM | POA: Diagnosis not present

## 2023-08-29 DIAGNOSIS — I480 Paroxysmal atrial fibrillation: Secondary | ICD-10-CM | POA: Diagnosis not present

## 2023-08-29 DIAGNOSIS — I482 Chronic atrial fibrillation, unspecified: Secondary | ICD-10-CM | POA: Diagnosis not present

## 2023-08-29 DIAGNOSIS — I890 Lymphedema, not elsewhere classified: Secondary | ICD-10-CM | POA: Diagnosis not present

## 2023-08-29 DIAGNOSIS — I10 Essential (primary) hypertension: Secondary | ICD-10-CM | POA: Diagnosis not present

## 2023-08-29 DIAGNOSIS — L958 Other vasculitis limited to the skin: Secondary | ICD-10-CM | POA: Diagnosis not present

## 2023-08-29 DIAGNOSIS — I87333 Chronic venous hypertension (idiopathic) with ulcer and inflammation of bilateral lower extremity: Secondary | ICD-10-CM | POA: Diagnosis not present

## 2023-08-29 DIAGNOSIS — Z7901 Long term (current) use of anticoagulants: Secondary | ICD-10-CM | POA: Diagnosis not present

## 2023-08-29 DIAGNOSIS — L97822 Non-pressure chronic ulcer of other part of left lower leg with fat layer exposed: Secondary | ICD-10-CM | POA: Diagnosis not present

## 2023-08-29 NOTE — Progress Notes (Addendum)
[name], [name] ([id number]) [id number] Page 1 of 8 Visit Report for 08/29/2023 Arrival Information Details Patient Name: Date of Service: [name] 08/29/2023 10:15 [name] M Medical Record Number: [id number] Patient Account Number: [id number] Date of Birth/Sex: Treating RN: 05-26-1946 (77 y.o. [name] Primary Care Cenia Zaragosa: [name] Other Clinician: Referring Meiko Stranahan: Treating Richerd Grime/Extender: [name] in Treatment: 15 Visit Information History Since Last Visit Added or deleted any medications: No Patient Arrived: Ambulatory Any new allergies or adverse reactions: No Arrival Time: 10:24 Has Dressing in Place as Prescribed: Yes Accompanied By: self Has Compression in Place as Prescribed: Yes Transfer Assistance: None Pain Present Now: No Patient Identification Verified: Yes Secondary Verification Process Completed: Yes Patient Requires Transmission-Based Precautions: No Patient Has Alerts: No Electronic Signature(s) Signed: 08/30/2023 12:46:12 PM By: [name] MSN RN CNS WTA Entered By: [name] on 08/29/2023 10:24:42 -------------------------------------------------------------------------------- Clinic Level of Care Assessment Details Patient Name: Date of Service: [name] 08/29/2023 10:15 [name] M Medical Record Number: [id number] Patient Account Number: [id number] Date of Birth/Sex: Treating RN: November 17, 1945 (77 y.o. [name] Primary Care [name]: [name] Other Clinician: Referring [name]: Treating Brandi Armato/Extender: [name] in Treatment: 15 Clinic Level of Care Assessment Items TOOL 1 Quantity Score []  - 0 Use when EandM and Procedure is performed on INITIAL visit ASSESSMENTS - Nursing Assessment / Reassessment []  - 0 General Physical Exam (combine w/ comprehensive assessment (listed just below) when performed on new pt. evals) []  -  0 Comprehensive Assessment (HX, ROS, [name] Assessments, Wounds Hx, etc.) ASSESSMENTS - Wound and Skin Assessment / Reassessment []  - 0 Dermatologic / Skin Assessment (not related to wound area) ASSESSMENTS - Ostomy and/or Continence Assessment and Care OLUWATOSIN, [name] [id number] Page 2 of 8 []  - 0 Incontinence Assessment and Management []  - 0 Ostomy Care Assessment and Management (repouching, etc.) PROCESS - Coordination of Care []  - 0 Simple Patient / Family Education for ongoing care []  - 0 Complex (extensive) Patient / Family Education for ongoing care []  - 0 Staff obtains Chiropractor, Records, T Results / Process Orders est []  - 0 Staff telephones HHA, Nursing Homes / Clarify orders / etc []  - 0 Routine Transfer to another Facility (non-emergent condition) []  - 0 Routine Hospital Admission (non-emergent condition) []  - 0 New Admissions / Manufacturing engineer / Ordering NPWT Apligraf, etc. , []  - 0 Emergency Hospital Admission (emergent condition) PROCESS - Special Needs []  - 0 Pediatric / Minor Patient Management []  - 0 Isolation Patient Management []  - 0 Hearing / Language / Visual special needs []  - 0 Assessment of Community assistance (transportation, [name], etc.) []  - 0 Additional assistance / Altered mentation []  - 0 Support Surface(s) Assessment (bed, cushion, seat, etc.) INTERVENTIONS - Miscellaneous []  - 0 External ear exam []  - 0 Patient Transfer (multiple staff / Nurse, adult / Similar devices) []  - 0 Simple Staple / Suture removal (25 or less) []  - 0 Complex Staple / Suture removal (26 or more) []  - 0 Hypo/Hyperglycemic Management (do not check if billed separately) []  - 0 Ankle / Brachial Index (ABI) - do not check if billed separately Has the patient been seen at the hospital within the last three years: Yes Total Score: 0 Level Of Care: ____ Electronic Signature(s) Signed: 08/30/2023 12:46:12 PM  By: [name] MSN RN CNS WTA Entered By: [name] on 08/29/2023 11:31:22 -------------------------------------------------------------------------------- Encounter Discharge Information Details Patient  Name: Date of Service: [name] 08/29/2023 10:15 [name] M Medical Record Number: [id number] Patient Account Number: [id number] Date of Birth/Sex: Treating RN: Aug 05, 1946 (76 y.o. [name] Primary Care [name]: [name] Other Clinician: Referring [name]: Treating [name]/Extender: [name] in Treatment: 15 Encounter Discharge Information Items Discharge Condition: [address] [name] ([id number]) [id number] Page 3 of 8 Ambulatory Status: Ambulatory Discharge Destination: Home Transportation: Private Auto Accompanied By: self Schedule Follow-up Appointment: Yes Clinical Summary of Care: Electronic Signature(s) Signed: 08/29/2023 11:32:35 AM By: [name] MSN RN CNS WTA Entered By: [name] on 08/29/2023 11:32:35 -------------------------------------------------------------------------------- Lower Extremity Assessment Details Patient Name: Date of Service: [name], [name] 08/29/2023 10:15 [name] M Medical Record Number: [id number] Patient Account Number: [id number] Date of Birth/Sex: Treating RN: 04-30-1946 (77 y.o. [name] Primary Care [name]: [name] Other Clinician: Referring [name]: Treating [name]/Extender: [name] in Treatment: 15 Edema Assessment Assessed: [name]: No] [name]: No] [Left: Edema] [Right: :] Calf Left: Right: Point of Measurement: 37 cm From Medial Instep 35.2 cm Ankle Left: Right: Point of Measurement: 12 cm From Medial Instep 23.5 cm Vascular Assessment Pulses: Dorsalis Pedis Palpable: [Left:Yes] Extremity colors, hair growth, and conditions: Extremity Color: [Left:Red] Hair Growth on Extremity:  [Left:No] Temperature of Extremity: [Left:Warm] Capillary Refill: [Left:< 3 seconds] Dependent Rubor: [Left:No] Blanched when Elevated: [Left:No No] Toe Nail Assessment Left: Right: Thick: No Discolored: No Deformed: No Improper Length and Hygiene: No Electronic Signature(s) Signed: 08/30/2023 12:46:12 PM By: [name] MSN RN CNS WTA Entered By: [name] on 08/29/2023 10:39:58 [name] ([id number]) [id number] Page 4 of 8 -------------------------------------------------------------------------------- Multi Wound Chart Details Patient Name: Date of Service: [name] 08/29/2023 10:15 [name] M Medical Record Number: [id number] Patient Account Number: [id number] Date of Birth/Sex: Treating RN: 22-Apr-1946 (77 y.o. [name] Primary Care Jeana Kersting: [name] Other Clinician: Referring Amil Bouwman: Treating Chasyn Cinque/Extender: [name] in Treatment: 15 Vital Signs Height(in): 75 Pulse(bpm): 47 Weight(lbs): 220 Blood Pressure(mmHg): 147/67 Body Mass Index(BMI): 27.5 Temperature(F): 98.3 Respiratory Rate(breaths/min): 18 [21R:Photos:] [[name]:[name]] Left, Lateral Lower Leg [name] Wound Location: Gradually Appeared [name] Wounding Event: Vasculitis [name] Primary Etiology: Arrhythmia, Hypertension, Gout [name] Comorbid History: 04/08/2023 [name] Date Acquired: 15 [name] Weeks of Treatment: Open [name] Wound Status: No [name] Wound Recurrence: 3.3x2.5x0.1 [name] Measurements L x W x D (cm) 6.48 [name] [name] (cm) : rea 0.648 [name] Volume (cm) : 52.90% [name] % Reduction in [name] rea: 52.80% [name] % Reduction in Volume: Full Thickness Without Exposed [name] Classification: Support Structures None Present [name] Exudate Amount: CHEM CAUT GRANULATION TISS [name] Procedures Performed: Treatment Notes Electronic Signature(s) Signed: 08/29/2023 11:30:38 AM By: [name] MSN RN CNS  WTA Entered By: [name] on 08/29/2023 11:30:38 [name] ([id number]) [id number] Page 5 of 8 -------------------------------------------------------------------------------- Multi-Disciplinary Care Plan Details Patient Name: Date of Service: [name] 08/29/2023 10:15 [name] M Medical Record Number: [id number] Patient Account Number: [id number] Date of Birth/Sex: Treating RN: 07/04/1946 (77 y.o. [name] Primary Care Beula Joyner: [name] Other Clinician: Referring Bralin Garry: Treating Ameya Kutz/Extender: [name] in Treatment: 15 Active Inactive Wound/Skin Impairment Nursing Diagnoses: Knowledge deficit related to ulceration/compromised skin integrity Goals: Patient/caregiver will verbalize understanding of skin care regimen Date Initiated: 05/16/2023 Target Resolution Date: 10/07/2023 Goal Status: Active Ulcer/skin breakdown will  have [name] volume reduction of 30% by week 4 Date Initiated: 05/16/2023 Date Inactivated: 06/20/2023 Target Resolution Date: 06/16/2023 Goal Status: Met Ulcer/skin breakdown will have [name] volume reduction of 50% by week 8 Date Initiated: 05/16/2023 Date Inactivated: 07/17/2023 Target Resolution Date: 07/16/2023 Goal Status: Met Ulcer/skin breakdown will have [name] volume reduction of 80% by week 12 Date Initiated: 05/16/2023 Date Inactivated: 07/17/2023 Target Resolution Date: 08/16/2023 Goal Status: Met Ulcer/skin breakdown will heal within 14 weeks Date Initiated: 05/16/2023 Date Inactivated: 07/17/2023 Target Resolution Date: 09/15/2023 Goal Status: Met Interventions: Assess patient/caregiver ability to obtain necessary supplies Assess patient/caregiver ability to perform ulcer/skin care regimen upon admission and as needed Assess ulceration(s) every visit Notes: Electronic Signature(s) Signed: 08/29/2023 11:31:39 AM By: [name] MSN RN CNS WTA Entered By: [name] on 08/29/2023  11:31:39 -------------------------------------------------------------------------------- Pain Assessment Details Patient Name: Date of Service: [name], [name] 08/29/2023 10:15 [name] M Medical Record Number: [id number] Patient Account Number: [id number] JAZMINE, ZECH ([id number]) [phone].pdf Page 6 of 8 Date of Birth/Sex: Treating RN: 05-26-1946 (77 y.o. [name] Primary Care Nalayah Hitt: Other Clinician: [name] Referring Antione Obar: Treating Deara Bober/Extender: [name] in Treatment: 15 Active Problems Location of Pain Severity and Description of Pain Patient Has Paino No Site Locations Pain Management and Medication Current Pain Management: Electronic Signature(s) Signed: 08/30/2023 12:46:12 PM By: [name] MSN RN CNS WTA Entered By: [name] on 08/29/2023 10:30:08 -------------------------------------------------------------------------------- Patient/Caregiver Education Details Patient Name: Date of Service: [name] 11/21/2024andnbsp10:15 [name] M Medical Record Number: [id number] Patient Account Number: [id number] Date of Birth/Gender: Treating RN: 08-Oct-1946 (77 y.o. [name] Primary Care Physician: [name] Other Clinician: Referring Physician: Treating Physician/Extender: [name] in Treatment: 15 Education Assessment Education Provided To: Patient Education Topics Provided Wound/Skin Impairment: Handouts: Caring for Your Ulcer Methods: Explain/Verbal Responses: State content correctly Electronic Signature(s) Tyra, Gaither Gwynn [name] ([id number]) [id number] Page 7 of 8 Signed: 08/30/2023 12:46:12 PM By: [name] MSN RN CNS WTA Entered By: [name] on 08/29/2023 11:31:50 -------------------------------------------------------------------------------- Wound Assessment Details Patient Name: Date of Service: [name]  [name] 08/29/2023 10:15 [name] M Medical Record Number: [id number] Patient Account Number: [id number] Date of Birth/Sex: Treating RN: [birth year] (77 y.o. [name] Primary Care Allissa Albright: [name] Other Clinician: Referring Dellene Mcgroarty: Treating Verble Styron/Extender: [name] in Treatment: 15 Wound Status Wound Number: 21R Primary Etiology: Vasculitis Wound Location: Left, Lateral Lower Leg Wound Status: Open Wounding Event: Gradually Appeared Comorbid History: Arrhythmia, Hypertension, Gout Date Acquired: 04/08/2023 Weeks Of Treatment: 15 Clustered Wound: No Photos Wound Measurements Length: (cm) 3.3 Width: (cm) 2.5 Depth: (cm) 0.1 Area: (cm) 6.48 Volume: (cm) 0.648 % Reduction in Area: 52.9% % Reduction in Volume: 52.8% Wound Description Classification: Full Thickness Without Exposed Support Exudate Amount: None Present Structures Treatment Notes Wound #21R (Lower Leg) Wound Laterality: Left, Lateral Cleanser Soap and Water Discharge Instruction: Gently cleanse wound with antibacterial soap, rinse and pat dry prior to dressing wounds Wound Cleanser Discharge Instruction: Wash your hands with soap and water. Remove old dressing, discard into plastic bag and place into trash. Cleanse the wound with Wound Cleanser prior to applying [name] clean dressing using gauze sponges, not tissues or cotton balls. Do not scrub or use excessive force. Pat dry using gauze sponges, not tissue or cotton balls. Peri-Wound Care Topical NUMA, BACHMANN [name] ([id number]) [id number] Page 8 of 8 Triamcinolone Acetonide Cream, 0.1%, 15 (g) tube Discharge Instruction:  Apply to entire lower leg Primary Dressing Hydrofera Blue Ready Transfer Foam, 2.5x2.5 (in/in) Discharge Instruction: Apply Hydrofera Blue Ready to wound bed as directed Curad Oil Emulsion Dressing 3x3 (in/in) Discharge Instruction: prior to HB Secondary Dressing Zetuvit Plus 4x8  (in/in) Secured With Compression Wrap Urgo K2, two layer compression system, regular Compression Stockings Add-Ons Electronic Signature(s) Signed: 08/30/2023 12:46:12 PM By: [name] MSN RN CNS WTA Entered By: [name] on 08/29/2023 10:39:12 -------------------------------------------------------------------------------- Vitals Details Patient Name: Date of Service: [name], [name] 08/29/2023 10:15 [name] M Medical Record Number: [id number] Patient Account Number: [id number] Date of Birth/Sex: Treating RN: [birth year] (78 y.o. [name] Primary Care Kaydence Menard: [name] Other Clinician: Referring Amber Guthridge: Treating Marcellous Snarski/Extender: [name] in Treatment: 15 Vital Signs Time Taken: 10:28 Temperature (F): 98.3 Height (in): 75 Pulse (bpm): 47 Weight (lbs): 220 Respiratory Rate (breaths/min): 18 Body Mass Index (BMI): 27.5 Blood Pressure (mmHg): 147/67 Reference Range: 80 - 120 mg / dl Electronic Signature(s) Signed: 08/30/2023 12:46:12 PM By: [name] MSN RN CNS WTA Entered By: [name] on 08/29/2023 10:30:01

## 2023-08-29 NOTE — Progress Notes (Addendum)
[name], [name] ([id number]) [id number] Page 1 of 13 Visit Report for 08/29/2023 Chief Complaint Document Details Patient Name: Date of Service: [name] 08/29/2023 10:15 [name] Medical Record Number: [id number] Patient Account Number: [id number] Date of Birth/Sex: Treating RN: 07-12-1946 (77 y.o. [name] Primary Care Provider: Mila [name] Other Clinician: Referring Provider: Treating Provider/Extender: [name]: 15 Information Obtained from: Patient Chief Complaint Left LE Ulcer Electronic Signature(s) Signed: 08/29/2023 10:23:07 AM By: [name] Derry PA-C Entered By: [name] on 08/29/2023 10:23:07 -------------------------------------------------------------------------------- HPI Details Patient Name: Date of Service: [name], [name] 08/29/2023 10:15 [name] Medical Record Number: [id number] Patient Account Number: [id number] Date of Birth/Sex: Treating RN: 1945-11-13 (77 y.o. [name] Primary Care Provider: Mila [name] Other Clinician: Referring Provider: Treating Provider/Extender: [name]: 15 History of Present Illness HPI Description: 77 year old male who has [name] past medical history of essential hypertension, chronic atrial fibrillation, peripheral vascular disease, nonischemic cardiomyopathy,venous stasis dermatitis, gouty arthropathy, basal cell carcinoma of the right lower extremity, benign prostatic hypertrophy, long- term use of anticoagulation therapy, hyperglycemia and exercise intolerance has never been [name] smoker. the patient has had [name] vascular workup over 7 years ago and said everything was normal at that stage. He does not have any chronic problems except for cardiac issues which he sees [name] cardiologist in [place]. 08/15/2017 -- arterial and venous duplex studies still pending. 08/23/2017 -- venous reflux studies done on 08/13/2017  shows venous incompetence throughout the left lower extremity deep system and focally at the left saphenofemoral junction. 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 No evidence of SVT or DVT in bilateral lower extremities The patient has an appointment at the end of the month to get his arterial duplex study done 09/05/2017 -- the patient was seen at the vein and vascular office yesterday by [name] ABI studies were notable for medial calcification and the toe brachial indices were normal and bilateral ankle-brachial) waveforms were normal with triphasic flow. After review of his venous studies he was not [name] candidate for laser ablation and his lymphedema was to be treated with compression stockings and lymphedema pump pumps 09/12/2017 -- had [name] low arterial study done at the [facility] -- unable to obtain reliable ABI is due to medial calcification. Bilateral toe [name] ([id number]) [id number] Page 2 of 13 indices were normal with the right being 1.01 and the left being 0.92 and the waveforms were triphasic bilaterally. he did get hold of 30-40 mm compression stockings but is unable to put these on. We will try and get him alternative compression stockings. 09/26/17- he is here in follow up evaluation of [name] right lower extremity ulcer;he is compliant in wearing compression stocking; ulcer almost epithelialized , anticipate healing next appointment Readmission: 11/17 point upon evaluation patient's wound currently that he is seeing [place] for today is [name] skin cancerous lesion that was cleared away by his dermatologist on the left medial calf region. He tells me that this is [name] very similar thing to what he had done previously in fact the last time he saw him in 2018 this was also what was going on at that point. Nonetheless he feels that based on what he seeing currently that this is just having [name] lot of harder time  healing although it is much closer to the surface than what he is experienced in the past.  He notes that the initial removal was in June 2022 which was this year this is now November and still has not closed. He does have some edema and definitely I think that there is some venous component to his slow healing here. Also think that we can do something better than Vaseline to try to help with getting this to clear up as quickly as possible. He does have [name] history of atrial fibrillation and is on Eliquis otherwise he really has no major medical problems that would affect wound healing. 09/07/2021 upon evaluation today patient actually appears to be doing significantly better after having wrapped him last week. Overall I think that this is making significant improvements at this time which is great news. I do not see any evidence of infection which is great news as well. No fevers, chills, nausea, vomiting, or diarrhea. 09/14/2021 upon evaluation today patient appears to be doing well with regard to his leg ulcer. He has been tolerating the dressing changes and overall I think that he is making excellent progress. I do not see any signs of active infection at this time. 09/21/2021 upon evaluation today patient actually appears to be making good progress with regard to his wound this is again measuring smaller today no debridement seems to be necessary. We have been using [name] silver collagen dressing and I think that is doing an awesome job. 09/28/2021 upon evaluation today patient appears to be doing well with regard to his leg currently. I do not see any signs of active infection at this time which is great news. No fevers, chills, nausea, vomiting, or diarrhea. I think this wound is very close to complete resolution. 10/12/2021 upon evaluation today patient actually appears to be doing awesome in regard to his leg ulcer. In fact this appears to be completely healed based on what I am seeing currently. I do  not see any evidence of active infection locally nor systemically at this time which is also great news. No fevers, chills, nausea, vomiting, or diarrhea. Readmission: 12/07/2021 upon evaluation today patient presents for readmission here in the clinic. He was discharged on 10/12/2021 is completely healed. Unfortunately this has reopened at this point and he is having continual issues with new blisters over both lower extremities. This is even worse than what we previously saw. Nonetheless we did actually check his ABIs today and it did reveal that his ABIs were 0.55 on the left and 0.57 on the right. Subsequently this is [name] definite change from his last arterial study which showed that he did have good blood flow at 1.01 on the right and 0.92 on the left and that was right at the beginning of 2019. Nonetheless based on what we see currently I do think he tolerated the 3 layer compression wrap but I do believe that we probably need to get him tested for his arterial flow in order to see where things stand and if there is something we can do there that would help prevent this from continue to be an ongoing issue. He did not utilize compression socks in the interim from when he was last here till this time. That something is probably going to need lifelong going forward as well. 3/9; patient presents for follow-up. He has no issues or complaints today. He tolerated the compression wrap well. He had ABIs with TBI's done. He denies signs of infection. 12/21/2021 upon evaluation today patient appears to be doing well with regard to the wounds on his legs. Both  are showing signs of significant improvement which is great news although I do believe some sharp debridement would be of benefit here as well. 12/28/2021 upon evaluation today patient appears to be doing well with regard to his wounds. Everything is showing signs of excellent improvement which I am very pleased about. I think that we are headed in the  right direction here. Fortunately there does not appear to be any evidence of infection which is great news there is [name] little bit of hypergranulation. 01/04/2022 upon evaluation today patient appears to be doing well with regard to his wounds 2 of them are healed 1 is almost so and the other 1 is significantly better. Overall I am extremely pleased with where we stand and I think that he is making excellent progress here. I do not see any evidence of active infection locally nor systemically at this time. 01-16-2022 upon evaluation today patient's wound on the left leg is showing signs of doing quite well. Has not completely cleared at this point but it is much improved. Fortunately I do not see any signs of infection at this time. No fevers, chills, nausea, vomiting, or diarrhea. 01-23-2022 upon evaluation today patient's wound of the left leg actually appears to be pretty much completely healed which is great news. I do not see any signs of active infection locally or systemically which is excellent. With that being said on the right leg what wound is measuring smaller the other 1 is [name] new wound that just showed up fortunately its not too bad. Has been using Xeroform here and that seems to be doing decently well which is great news. Unfortunately his blood pressure is significantly high we gave him the readings for the past 4-5 visits as well as [name] recommendation to make an appointment to go discuss this with his primary care provider patient states that he is going to look into doing this. 01-30-2022 upon evaluation today patient appears to be doing well with regard to his left leg everything appears to be healed. On the right leg the more anterior wound is healed the more medial wound that I been concerned about [name] possible skin cancer unfortunately still does not look great to me. I do believe that we should probably do [name] biopsy I have talked about it with him [name] few times I think though it is probably  time to go ahead and do this at this point. 02-09-2022 upon evaluation today patient appears to be doing well with regard to his legs. On the left this appears to be completely healed. On the right he does have 2 areas and be perfectly honest one of them is [name] skin cancer that he is going to the Mohs surgery clinic for the other seems to be healing nicely. Readmission: 08-02-2022 upon evaluation today patient appears for reevaluation here in our clinic concerning issues that he has been having with wounds over the bilateral lower extremities. I last saw him in May 2023 and at that point we had him completely healed. Unfortunately he is tells me this has broken down to some degree since that point. 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 He tells me this 1 seems to just be getting bigger and bigger not improving. Fortunately he is not having  any significant pain which is good news he does have quite [name] bit of swelling and he tells me that his fluid pills are not recommended for him to take daily but just in 3-day intervals here and there. 08-09-2022 upon evaluation today patient appears to be doing still somewhat poorly in regard to his legs although in general he does not appear to be feeling as good as he has been. Fortunately there does not appear to be any signs of infection which is good news. With that being said he is having some issues here with having and overall poor feeling in general which again is good I think going to be the biggest complicating factor. He actually seems to be coughing I do not hear any wheezing right now I did listen to his chest he did not have good airflow down low however makes me suspicious for bronchitis or even possibly pneumonia which could be part of what is going on  here as well. Fortunately I do not see any evidence of anything worsening in regard to his legs but I definitely believe that he needs to continue with the compression wraps he took them off yesterday to shower has not had anything on for 24 hours that is why his legs are so swollen today. With regard to his pathology report I did review that it showed some squamous abnormality but no signs of distinct carcinoma. With that being said it was JASION, TUCCIO ([id number]) [id number] Page 3 of 13 saying that it could be adjacent to [name] squamous cell carcinoma nonetheless my suggestion is can be that we have the patient take copy of this report and give it to his Mohs surgeon in order for them to see if there is anything they feel like needs to be done further. With that being said right now I feel like the primary thing is going to be for [place] to try to get his swelling down and keep that down into that hand since he is having so much drainage I believe we can have to bring him in for dressing changes twice [name] week doing [name] nurse visit on Mondays. 11/9; since the patient was last here he spent the night in the emergency room he received IV Lasix. Also received antibiotics although he was not discharged on either 1 of these. He also saw his cardiology office who put him on regular Lasix 20 mg [previously on as needed Lasix 20 mg]. Per our intake nurse the swelling in his legs is remarkably better but he still has bilateral lower extremity wounds. He still has wounds on the bilateral lower extremities most problematically on the left lateral calf. He has been using silver alginate under 3 layer compression. 08-23-2022 upon evaluation today patient appears to be doing much better than the last time I saw him 2 weeks ago. At that point I was very concerned about how he was doing he did see Dr. Sherrie Mustache his primary care provider they got him on some blood pressure medication in general his  color and overall appearance looks to be doing much improved compared to the last time I saw him. 09-04-2022 upon evaluation today patient appears to be doing well currently in regard to his wounds. Everything is showing signs of improvement which is great news. Fortunately there does not appear to be any signs of active infection locally or systemically at this time. No fevers, chills, nausea, vomiting, or diarrhea. 09-10-2022 upon evaluation today patient appears to be doing  better in regard to his wounds although the [facility] was extremely stuck to the wound bed. Fortunately there does not appear to be any signs of infection locally or systemically at this time which is great news. No fevers, chills, nausea, vomiting, or diarrhea. 09-17-2022 upon evaluation today patient appears to be doing well currently in regard to his wounds in general. The right leg actually showing signs of excellent improvement and very pleased with where things stand in that regard. Fortunately I do not see any evidence of infection locally or systemically at this time which is great news. No fevers, chills, nausea, vomiting, or diarrhea. 09-24-2022 upon evaluation today patient appears to be doing well currently in regard to his wounds. Things look to be doing quite well. 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 He is seeing his dermatologist tomorrow in that regard. With that being said other than that however he seems to really be making some pretty good progress here which is good news. No fevers, chills, nausea, vomiting, or diarrhea. 12/26; the patient has 2 open wounds remaining on the left leg. One is on the left anterior mid tibia and the other is on the right lateral knee just outside of the popliteal fossa. The latter wound apparently has been biopsied showing squamous cell  carcinoma. The patient has been to see dermatology Dr. Adolphus Birchwood who apparently is making him [name] referral to the [facility]. He does not yet have an appointment 10-12-2022 upon evaluation today patient appears to be doing well currently in regard to his wound. He has been tolerating the dressing changes without complication and overall feel like we are headed in the right direction. Fortunately I do not see any signs of infection locally or systemically at this time which is great news. No fevers, chills, nausea, vomiting, or diarrhea. 10-23-2022 upon evaluation today patient appears to be doing well currently in regard to his wound. He has been tolerating the dressing changes without complication. Fortunately there does not appear to be any signs of active infection locally nor systemically which is great news and overall I am extremely pleased with where we stand currently. No fevers, chills, nausea, vomiting, or diarrhea. 10-26-2022 upon evaluation today patient appears to be doing well currently in regard to his wounds. Everything is showing signs of improvement and this is great news. Fortunately I see no evidence of active infection systemically. He does seem to be doing much better in regard to the local infection in regard to his leg. The smell is also greatly improved. Overall I am extremely happy with where we stand today. This is after just [name] few days with the antibiotic on board. 11-05-2022 upon evaluation today patient appears to be doing well currently in regard to his wounds although the wound where they performed the Mohs surgery does look [name] little bit hyper granulated I think switching to [facility] may be better for him. He voiced understanding. Fortunately there does not appear to be any evidence of active infection locally nor systemically at this time. 11-12-2022 upon evaluation today patient appears to be doing better in regard to both wounds he has been tolerating  the dressing changes without complication. There is no signs of infection and in general I think you are doing quite well. No fevers, chills, nausea, vomiting, or diarrhea. 11-20-2022 upon  evaluation today patient appears to be doing well currently in regard to his wounds. He has been tolerating the dressing changes without complication. Fortunately there does not appear to be any signs of infection at this time. No fevers, chills, nausea, vomiting, or diarrhea. 11-27-2022 upon evaluation today patient appears to be doing somewhat poorly in regard to his leg in general he has [name] lot of areas where he looks like he had some spots that popped up. There with regard to new possible blisters. In general I am actually very concerned about the fact that the wrap may be causing some irritation here. I think that we can try to not do the wrap for 1 week, and given the prescription for mupirocin ointment which I will send into the pharmacy for him. 12-04-2022 upon evaluation today patient appears to be doing well currently in regard to his wounds in fact he appears to be pretty much completely healed based on what I am seeing at this point. I do not see any signs of active infection locally nor systemically at this time which is great news. No fevers, chills, nausea, vomiting, or diarrhea. 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 I discussed that with him today he is aware of the situation.-2024 upon evaluation patient presents for readmission here in the clinic actually last saw him December 04, 2022. With that being said previously his dermatologist had asked that if there was something the need to be addressed from [name] dermatology standpoint and specifically biopsy that we make referral back to them to allow them to do it which I am definitely happy to  do. 01-31-2023 upon evaluation today patient appears to be doing well currently in regard to his wound in fact this looks better he did see Dr. Adolphus Birchwood his dermatologist yesterday and they did perform [name] biopsy. With that being said he actually looks like he is doing much better Dr. Adolphus Birchwood feels like this may not be [name] cancerous lesion which will be very good news at the same time I definitely wanted to make sure especially considering his history and the way this looks when we saw him last week. Nonetheless I am extremely pleased with the fact that he is doing so much better at this point. 02-07-2023 upon evaluation today patient appears to be doing well currently in regard to his wound from the standpoint of size it has not gotten any larger. With that being said he does have some issues here still with what appears to be potentially some infection. Wound does not look quite as good as it did last week. We are still waiting on the results for the pathology. I did put [name] call into dermatology but I have not heard anything back from them as of yet. 02-14-2023 upon evaluation today patient appears to be doing well with regard to his wound infection is definitely under control and looks much better. Fortunately I do not see any signs of systemic infection with anyone locally I think this is improved greatly. 02-19-2023 upon evaluation today patient appears to be doing well currently in regard to his wound which is actually measuring smaller and looking much better. Fortunately I do not see any evidence of active infection locally nor systemically which is great news and overall I am extremely pleased with where we stand today. 02-26-2023  upon evaluation today patient appears to be doing well currently in regard to his wound. He is actually been tolerating the dressing changes without complication. Fortunately there does not appear to be any signs of active infection locally nor systemically which is great news and  overall I am extremely YGNACIO, RADEK [name] ([id number]) [id number] Page 4 of 13 pleased with where we stand currently. 03-12-2023 upon evaluation today patient's wound actually showed signs of excellent improvement. I am very pleased with where we stand I do believe that we are making good progress here. I do not see any signs of active infection. 03-19-2023 upon evaluation today patient actually appears to be making excellent progress in regard to his leg and getting this closed. In fact is just [name] very small area that is actually open at this point. I am actually very pleased with what we are seeing today. 03-26-2023 upon evaluation today patient appears to be doing well currently in regard to his leg which is actually showing signs of being completely healed. Fortunately I do not see any signs of active infection locally or systemically which is great news and in general I do believe that we are moving in the right direction here. Readmission: 05-16-2023 upon evaluation today patient presents for reevaluation here in the clinic concerning [name] wound on the left anterior/lateral lower extremity. This is similar to where the wound was last time I saw him but not exactly the same. Fortunately there does not appear to be any signs of active infection at this time which is great news. The patient's past medical history really has not changed significantly since last time I saw him. 05-24-2023 upon evaluation patient actually appears to be doing excellent in regard to his leg ulcer. He has been tolerating the dressing changes without complication in general I do feel like there were making excellent headway towards complete closure. I do not see any signs of active infection at this point. 8/29; left lateral lower leg in the setting of chronic venous insufficiency. We have been using Hydrofera Blue under Urgo K2. Wounds are making nice improvements 06-20-2023 upon evaluation today patient  appears to be doing well currently in regard to his leg ulcer. This is actually showing signs of being significantly smaller compared even last week's nurse visit this looks much better. Fortunately I do not see any need for sharp debridement he seems to be doing quite well. 06-27-2023 upon evaluation today patient appears to be doing well currently in regard to his wound. He is tolerating the dressing changes without complication. There is [name] little bit of hypergranulation on the use of silver nitrate today to help keep this under control. Fortunately I do not see any evidence of active infection locally or systemically which is great news. 07-04-2023 upon evaluation today patient appears to be doing well currently in regard to his wound. He has been tolerating the dressing changes without complication. Fortunately I do not see any signs of active infection locally or systemically which is great news and in general I do believe that he is making really good headway towards complete closure. This looks much better after starting the antibiotics. 07-09-2023 upon evaluation today patient appears to be doing excellent in regard to his leg which is actually significantly improved. I am very pleased with where we stand and I do believe that he is making really good headway towards getting this closed in fact it pretty much appears to be almost closed today there is just [name] very  tiny area that I think needs 1 week to toughen up before we cut him loose completely. 07-16-2023 upon evaluation today patient appears to be doing well currently in regard to his wounds. In fact everything is completely healed and looks to be doing great. Fortunately I do not see any signs of active infection at this time which is great news. No fevers, chills, nausea, vomiting, or diarrhea. 07-30-2023 upon evaluation today patient unfortunately has [name] breakdown of the wound on the lateral aspect of his leg. Unfortunately I am [name] little  concerned about the overall appearance of the leg I feel like he may require [name] biopsy to further evaluate the situation. I discussed that with the patient today. With that being said I previously been asked by Dr. Adolphus Birchwood not to perform any of the biopsies myself but to let them see it prior to the biopsy. For that reason I would go ahead and see about making referral to Dr. Durene Cal office to get this scheduled as quickly as possible is my hope. We reached out to them have not heard back as of yet. 08-08-2023 upon evaluation today patient's wound actually showed signs of doing really about the same with regard to his wound. There does not appear to be any signs of active infection at this time which is good news with that being said they did not biopsy this at the office today when he went to dermatology. This is unfortunate as I thought that we can do that now. I will get [name] try to see if we get in touch with Dr. Durene Cal office if were not able to get this done before I see him next week, going to just do the biopsy myself just to make sure we know what we are dealing with patient voiced understanding he is in agreement with the plan. 08-15-2023 upon evaluation today patient appears to be doing pretty well currently in regard to his wound which is okay he did have [name] biopsy today apparently they had [name] hard time getting it to stop bleeding that had [name] pressure dressing on pretty tightly the good news is hemostasis has been achieved. With that being said the patient tells me that he unfortunately when they unwrapped him had several areas that were somewhat pussy and opened in regard to his leg I think he may have [name] bit of an infection ensuing again already. 08-22-2023 upon evaluation today patient's leg appears to be somewhat better in regards to the infection but unfortunately is still having some issues here with the wound the good news is Dr. Durene Cal report did come to me and the patient did have [name]  staph infection but otherwise did not have any signs of cancer pathology. 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 08-29-2023 upon evaluation today patient's leg is actually showing signs of significant improvement. Fortunately I see no evidence of worsening overall and I do believe that he is making good headway here towards closure. Electronic Signature(s) Signed: 08/29/2023 5:51:03 PM By: [name] Derry PA-C Entered By: [name] on 08/29/2023 17:51:02 -------------------------------------------------------------------------------- [name] TISS Details Patient Name: Date of Service: [name], [name] 08/29/2023 10:15 [name] CAROS, MERGEL [name] ([id number]) [id number] Page 5 of 13 Medical Record Number: [id number] Patient Account Number: [id number] Date of Birth/Sex: Treating RN: 11-24-1945 (77 y.o. [name] Primary Care Provider: Mila [name] Other Clinician: Referring Provider: Treating Provider/Extender: [name]: 15 Procedure  Performed for: Wound #21R Left,Lateral Lower Leg Performed By: Physician [name] Derry, PA-C The following information was scribed by: [name] The information was scribed for: [name] Post Procedure Diagnosis Same as Pre-procedure Electronic Signature(s) Signed: 08/30/2023 12:46:12 PM By: [name] MSN RN CNS WTA Entered By: [name] on 08/29/2023 10:44:20 -------------------------------------------------------------------------------- Physical Exam Details Patient Name: Date of Service: [name] [name], [name] 08/29/2023 10:15 [name] Medical Record Number: [id number] Patient Account Number: [id number] Date of Birth/Sex: Treating RN: Mar 12, 1946 (77 y.o. [name] Primary Care Provider: Mila [name] Other Clinician: Referring Provider: Treating Provider/Extender: [name]:  15 Constitutional Well-nourished and well-hydrated in no acute distress. Respiratory normal breathing without difficulty. Psychiatric this patient is able to make decisions and demonstrates good insight into disease process. Alert and Oriented x 3. pleasant and cooperative. Notes Upon inspection patient's wound bed actually showed signs of good granulation epithelization at this point. Fortunately I do not see any signs of infection at this time which is good news and in general I do believe there were making good headway here towards closure I think that his leg is finally turning the corner this time again I am going to perform some silver nitrate chemical cauterization of the hypergranulation tissue and he is in agreement with this plan. Electronic Signature(s) Signed: 08/29/2023 5:51:30 PM By: [name] Derry PA-C Entered By: [name] on 08/29/2023 17:51:30 Niccoli, Olanta [name] ([id number]) [id number] Page 6 of 13 -------------------------------------------------------------------------------- Physician Orders Details Patient Name: Date of Service: [name] [name] Corner [name] 08/29/2023 10:15 [name] Medical Record Number: [id number] Patient Account Number: [id number] Date of Birth/Sex: Treating RN: 02/27/1946 (77 y.o. 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 Nurse Visit as needed Bathing/ Shower/ Hygiene May shower; gently cleanse wound with antibacterial soap, rinse and pat dry prior to dressing wounds Edema Control - Orders / Instructions Elevate, Exercise Daily and [name] void Standing for Long Periods of Time. Elevate leg(s) parallel to the floor when sitting. Medications-Please add to medication list. ntibiotics - continue Bactrim as directed P.O. [name] Wound [name] Wound #21R - Lower Leg Wound Laterality: Left, Lateral Cleanser: Soap and Water 1 x Per Week/30 Days Discharge Instructions: Gently cleanse wound with antibacterial soap, rinse and pat dry prior to dressing wounds Cleanser: Wound Cleanser 1 x Per Week/30 Days Discharge Instructions: Wash your hands with soap and water. Remove old dressing, discard into plastic bag and place into trash. Cleanse the wound with Wound Cleanser prior to applying [name] clean dressing using gauze sponges, not tissues or cotton balls. Do not scrub or use excessive force. Pat dry using gauze sponges, not tissue or cotton balls. Topical: Triamcinolone Acetonide Cream, 0.1%, 15 (g) tube 1 x Per Week/30 Days Discharge Instructions: Apply to entire lower leg Prim Dressing: Hydrofera Blue Ready Transfer Foam, 2.5x2.5 (in/in) 1 x Per Week/30 Days ary Discharge Instructions: Apply Hydrofera Blue Ready to wound bed  as directed Prim Dressing: Curad Oil Emulsion Dressing 3x3 (in/in) ary 1 x Per Week/30 Days Discharge Instructions: prior to HB Secondary Dressing: Zetuvit Plus 4x8 (in/in) 1 x Per Week/30 Days Compression Wrap: Urgo K2, two layer compression system, regular 1 x Per Week/30 Days Electronic Signature(s) Signed: 08/29/2023 11:31:16 AM By: [name] MSN RN CNS WTA Signed: 08/29/2023 6:01:43 PM By: [name] Derry PA-C Entered By: [name] on 08/29/2023 11:31:15 [name] ([id number])  [id number] Page 7 of 13 -------------------------------------------------------------------------------- Problem List Details Patient Name: Date of Service: [name] [name] Corner [name] 08/29/2023 10:15 [name] Medical Record Number: [id number] Patient Account Number: [id number] Date of Birth/Sex: Treating RN: [birth year] (77 y.o. 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/05/2023 No Yes I73.89 Other specified peripheral vascular diseases 05/16/2023 No Yes I48.0 Paroxysmal atrial fibrillation 05/16/2023 No Yes I10 Essential (primary) hypertension 05/16/2023 No Yes Inactive Problems Resolved Problems Electronic Signature(s) Signed: 08/29/2023 10:22:57 AM By: [name] Derry PA-C Entered By: [name] on 08/29/2023 10:22:57 [name] ([id number]) [id number] Page 8 of 13 -------------------------------------------------------------------------------- Progress Note Details Patient Name: Date of Service: [name] [name] Corner [name] 08/29/2023 10:15 [name] Medical Record Number: [id number] Patient Account Number: [id number] Date of Birth/Sex: Treating RN: 1946-04-17 (77 y.o. 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 the patient has had [name] vascular workup over 7 years ago and said everything was normal at that stage. He does not have any chronic problems except for cardiac issues which he sees [name] cardiologist in [place]. 08/15/2017 -- arterial and venous duplex studies still pending. 08/23/2017 -- venous reflux studies done on 08/13/2017 shows venous incompetence throughout the left lower extremity deep system and focally at the left saphenofemoral junction. 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 No evidence of SVT or DVT in bilateral lower extremities The patient has an appointment at the end of the month to get his arterial duplex study done 09/05/2017 -- the patient was seen at the vein and vascular office yesterday by [name] ABI studies were notable for medial calcification and the toe brachial indices were normal and bilateral ankle-brachial) waveforms were normal with triphasic flow. After review of his venous studies he was not [name] candidate for laser ablation and his lymphedema was to be treated with compression stockings and lymphedema pump pumps 09/12/2017 -- had [name] low arterial study done at the [facility] -- unable to obtain reliable ABI is due to medial calcification. Bilateral toe indices were normal with the right being 1.01 and the left being 0.92 and the waveforms were triphasic bilaterally. he did get hold of 30-40 mm compression stockings but is  unable to put these on. We will try and get him alternative compression stockings. 09/26/17- he is here in follow up evaluation of [name] right lower  extremity ulcer;he is compliant in wearing compression stocking; ulcer almost epithelialized , anticipate healing next appointment Readmission: 11/17 point upon evaluation patient's wound currently that he is seeing [place] for today is [name] skin cancerous lesion that was cleared away by his dermatologist on the left medial calf region. He tells me that this is [name] very similar thing to what he had done previously in fact the last time he saw him in 2018 this was also what was going on at that point. Nonetheless he feels that based on what he seeing currently that this is just having [name] lot of harder time healing although it is much closer to the surface than what he is experienced in the past. He notes that the initial removal was in June 2022 which was this year this is now November and still has not closed. He does have some edema and definitely I think that there is some venous component to his slow healing here. Also think that we can do something better than Vaseline to try to help with getting this to clear up as quickly as possible. He does have [name] history of atrial fibrillation and is on Eliquis otherwise he really has no major medical problems that would affect wound healing. 09/07/2021 upon evaluation today patient actually appears to be doing significantly better after having wrapped him last week. Overall I think that this is making significant improvements at this time which is great news. I do not see any evidence of infection which is great news as well. No fevers, chills, nausea, vomiting, or diarrhea. 09/14/2021 upon evaluation today patient appears to be doing well with regard to his leg ulcer. He has been tolerating the dressing changes and overall I think that he is making excellent progress. I do not see any signs of active infection at this time. 09/21/2021 upon evaluation today patient actually appears to be making good progress with regard to his wound this is again measuring smaller today  no debridement seems to be necessary. We have been using [name] silver collagen dressing and I think that is doing an awesome job. 09/28/2021 upon evaluation today patient appears to be doing well with regard to his leg currently. I do not see any signs of active infection at this time which is great news. No fevers, chills, nausea, vomiting, or diarrhea. I think this wound is very close to complete resolution. 10/12/2021 upon evaluation today patient actually appears to be doing awesome in regard to his leg ulcer. In fact this appears to be completely healed based on what I am seeing currently. I do not see any evidence of active infection locally nor systemically at this time which is also great news. No fevers, chills, nausea, vomiting, or diarrhea. Readmission: 12/07/2021 upon evaluation today patient presents for readmission here in the clinic. He was discharged on 10/12/2021 is completely healed. Unfortunately this has reopened at this point and he is having continual issues with new blisters over both lower extremities. This is even worse than what we previously saw. Nonetheless we did actually check his ABIs today and it did reveal that his ABIs were 0.55 on the left and 0.57 on the right. Subsequently this is [name] definite change from his last arterial study which showed that he did have good blood flow at 1.01 on the right and 0.92 on the  left and that was right at the beginning of 2019. Nonetheless based on what we see currently I do think he tolerated the 3 layer compression wrap but I do believe that we probably need to get him tested for his arterial flow in order to see where things stand and if there is something we can do there that would help prevent this from continue to be an ongoing issue. He did not utilize compression socks in the interim from when he was last here till this time. That something is probably going to need lifelong going forward as well. 3/9; patient presents for follow-up. He  has no issues or complaints today. He tolerated the compression wrap well. He had ABIs with TBI's done. He denies signs of infection. 12/21/2021 upon evaluation today patient appears to be doing well with regard to the wounds on his legs. Both are showing signs of significant improvement which is great news although I do believe some sharp debridement would be of benefit here as well. 12/28/2021 upon evaluation today patient appears to be doing well with regard to his wounds. Everything is showing signs of excellent improvement which I am very pleased about. I think that we are headed in the right direction here. Fortunately there does not appear to be any evidence of infection which is great news there is [name] little bit of hypergranulation. 01/04/2022 upon evaluation today patient appears to be doing well with regard to his wounds 2 of them are healed 1 is almost so and the other 1 is significantly [name], [name] ([id number]) [id number] Page 9 of 13 better. Overall I am extremely pleased with where we stand and I think that he is making excellent progress here. I do not see any evidence of active infection locally nor systemically at this time. 01-16-2022 upon evaluation today patient's wound on the left leg is showing signs of doing quite well. Has not completely cleared at this point but it is much improved. Fortunately I do not see any signs of infection at this time. No fevers, chills, nausea, vomiting, or diarrhea. 01-23-2022 upon evaluation today patient's wound of the left leg actually appears to be pretty much completely healed which is great news. I do not see any signs of active infection locally or systemically which is excellent. With that being said on the right leg what wound is measuring smaller the other 1 is [name] new wound that just showed up fortunately its not too bad. Has been using Xeroform here and that seems to be doing decently well which is great  news. Unfortunately his blood pressure is significantly high we gave him the readings for the past 4-5 visits as well as [name] recommendation to make an appointment to go discuss this with his primary care provider patient states that he is going to look into doing this. 01-30-2022 upon evaluation today patient appears to be doing well with regard to his left leg everything appears to be healed. On the right leg the more anterior wound is healed the more medial wound that I been concerned about [name] possible skin cancer unfortunately still does not look great to me. I do believe that we should probably do [name] biopsy I have talked about it with him [name] few times I think though it is probably time to go ahead and do this at this point. 02-09-2022 upon evaluation today patient appears to be doing well with regard to his legs. On the left this appears to be completely healed. On  the right he does have 2 areas and be perfectly honest one of them is [name] skin cancer that he is going to the Mohs surgery clinic for the other seems to be healing nicely. Readmission: 08-02-2022 upon evaluation today patient appears for reevaluation here in our clinic concerning issues that he has been having with wounds over the bilateral lower extremities. I last saw him in May 2023 and at that point we had him completely healed. Unfortunately he is tells me this has broken down to some degree since that point. 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 He tells me this 1 seems to just be getting bigger and bigger not improving. Fortunately he is not having any significant pain which is good news he does have quite [name] bit of swelling and he tells me that his fluid pills are not recommended for him to take daily but just in 3-day intervals here  and there. 08-09-2022 upon evaluation today patient appears to be doing still somewhat poorly in regard to his legs although in general he does not appear to be feeling as good as he has been. Fortunately there does not appear to be any signs of infection which is good news. With that being said he is having some issues here with having and overall poor feeling in general which again is good I think going to be the biggest complicating factor. He actually seems to be coughing I do not hear any wheezing right now I did listen to his chest he did not have good airflow down low however makes me suspicious for bronchitis or even possibly pneumonia which could be part of what is going on here as well. Fortunately I do not see any evidence of anything worsening in regard to his legs but I definitely believe that he needs to continue with the compression wraps he took them off yesterday to shower has not had anything on for 24 hours that is why his legs are so swollen today. With regard to his pathology report I did review that it showed some squamous abnormality but no signs of distinct carcinoma. With that being said it was saying that it could be adjacent to [name] squamous cell carcinoma nonetheless my suggestion is can be that we have the patient take copy of this report and give it to his Mohs surgeon in order for them to see if there is anything they feel like needs to be done further. With that being said right now I feel like the primary thing is going to be for [place] to try to get his swelling down and keep that down into that hand since he is having so much drainage I believe we can have to bring him in for dressing changes twice [name] week doing [name] nurse visit on Mondays. 11/9; since the patient was last here he spent the night in the emergency room he received IV Lasix. Also received antibiotics although he was not discharged on either 1 of these. He also saw his cardiology office who put him on regular Lasix  20 mg [previously on as needed Lasix 20 mg]. Per our intake nurse the swelling in his legs is remarkably better but he still has bilateral lower extremity wounds. He still has wounds on the bilateral lower  extremities most problematically on the left lateral calf. He has been using silver alginate under 3 layer compression. 08-23-2022 upon evaluation today patient appears to be doing much better than the last time I saw him 2 weeks ago. At that point I was very concerned about how he was doing he did see Dr. Sherrie Mustache his primary care provider they got him on some blood pressure medication in general his color and overall appearance looks to be doing much improved compared to the last time I saw him. 09-04-2022 upon evaluation today patient appears to be doing well currently in regard to his wounds. Everything is showing signs of improvement which is great news. Fortunately there does not appear to be any signs of active infection locally or systemically at this time. No fevers, chills, nausea, vomiting, or diarrhea. 09-10-2022 upon evaluation today patient appears to be doing better in regard to his wounds although the [facility] was extremely stuck to the wound bed. Fortunately there does not appear to be any signs of infection locally or systemically at this time which is great news. No fevers, chills, nausea, vomiting, or diarrhea. 09-17-2022 upon evaluation today patient appears to be doing well currently in regard to his wounds in general. The right leg actually showing signs of excellent improvement and very pleased with where things stand in that regard. Fortunately I do not see any evidence of infection locally or systemically at this time which is great news. No fevers, chills, nausea, vomiting, or diarrhea. 09-24-2022 upon evaluation today patient appears to be doing well currently in regard to his wounds. Things look to be doing quite well. 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 He is seeing his dermatologist tomorrow in that regard. With that being said other than that however he seems to really be making some pretty good progress here which is good news. No fevers, chills, nausea, vomiting, or diarrhea. 12/26; the patient has 2 open wounds remaining on the left leg. One is on the left anterior mid tibia and the other is on the right lateral knee just outside of the popliteal fossa. 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 The patient has been to see dermatology Dr. Adolphus Birchwood who apparently is making him [name] referral to the [facility] surgery center. He does not yet have an appointment 10-12-2022 upon evaluation today patient appears to be doing well currently in regard to his wound. He has been tolerating the dressing changes without complication and overall feel like we are headed in the right direction. Fortunately I do not see any signs of infection locally or systemically at this time which is great news. No fevers, chills, nausea, vomiting, or diarrhea. 10-23-2022 upon evaluation today patient appears to be doing well currently in regard to his wound. He has been tolerating the dressing changes without complication. Fortunately there does not appear to be any signs of active infection locally nor systemically which is great news and overall I am extremely pleased with where we stand currently. No fevers, chills, nausea, vomiting, or diarrhea. 10-26-2022 upon evaluation today patient appears to be doing well currently in regard to his wounds. Everything is showing signs of improvement and this is great news. Fortunately I see no evidence of active infection systemically. He does seem to be doing much better in regard to the  local infection in regard to his leg. The smell is also greatly improved. Overall I  am extremely happy with where we stand today. This is after just [name] few days with the antibiotic on board. 11-05-2022 upon evaluation today patient appears to be doing well currently in regard to his wounds although the wound where they performed the Mohs surgery does look [name] little bit hyper granulated I think switching to [facility] may be better for him. He voiced understanding. Fortunately there does not appear to be any evidence of active infection locally nor systemically at this time. 11-12-2022 upon evaluation today patient appears to be doing better in regard to both wounds he has been tolerating the dressing changes without complication. There is no signs of infection and in general I think you are doing quite well. No fevers, chills, nausea, vomiting, or diarrhea. [name], [name] ([id number]) [id number] Page 10 of 13 11-20-2022 upon evaluation today patient appears to be doing well currently in regard to his wounds. He has been tolerating the dressing changes without complication. Fortunately there does not appear to be any signs of infection at this time. No fevers, chills, nausea, vomiting, or diarrhea. 11-27-2022 upon evaluation today patient appears to be doing somewhat poorly in regard to his leg in general he has [name] lot of areas where he looks like he had some spots that popped up. There with regard to new possible blisters. In general I am actually very concerned about the fact that the wrap may be causing some irritation here. I think that we can try to not do the wrap for 1 week, and given the prescription for mupirocin ointment which I will send into the pharmacy for him. 12-04-2022 upon evaluation today patient appears to be doing well currently in regard to his wounds in fact he appears to be pretty much completely healed based on what I am seeing at this point. I do not see any signs of active infection locally nor systemically at this time which is great  news. No fevers, chills, nausea, vomiting, or diarrhea. 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 I discussed that with him today he is aware of the situation.-2024 upon evaluation patient presents for readmission here in the clinic actually last saw him December 04, 2022. With that being said previously his dermatologist had asked that if there was something the need to be addressed from [name] dermatology standpoint and specifically biopsy that we make referral back to them to allow them to do it which I am definitely happy to do. 01-31-2023 upon evaluation today patient appears to be doing well currently in regard to his wound in fact this looks better he did see Dr. Adolphus Birchwood his dermatologist yesterday and they did perform [name] biopsy. With that being said he actually looks like he is doing much better Dr. Adolphus Birchwood feels like this may not be [name] cancerous lesion which will be very good news at the same time I definitely wanted to make sure especially considering his history and the way this looks when we saw him last week. Nonetheless I am extremely pleased with the fact that he is doing so much better at this point. 02-07-2023 upon evaluation today patient appears to be doing well currently in regard to his wound from the standpoint of size it has not gotten any  larger. With that being said he does have some issues here still with what appears to be potentially some infection. Wound does not look quite as good as it did last week. We are still waiting on the results for the pathology. I did put [name] call into dermatology but I have not heard anything back from them as of yet. 02-14-2023 upon evaluation today patient appears to be doing well with regard to his wound infection is definitely under control and looks much better. Fortunately I do not see any signs of  systemic infection with anyone locally I think this is improved greatly. 02-19-2023 upon evaluation today patient appears to be doing well currently in regard to his wound which is actually measuring smaller and looking much better. Fortunately I do not see any evidence of active infection locally nor systemically which is great news and overall I am extremely pleased with where we stand today. 02-26-2023 upon evaluation today patient appears to be doing well currently in regard to his wound. He is actually been tolerating the dressing changes without complication. Fortunately there does not appear to be any signs of active infection locally nor systemically which is great news and overall I am extremely pleased with where we stand currently. 03-12-2023 upon evaluation today patient's wound actually showed signs of excellent improvement. I am very pleased with where we stand I do believe that we are making good progress here. I do not see any signs of active infection. 03-19-2023 upon evaluation today patient actually appears to be making excellent progress in regard to his leg and getting this closed. In fact is just [name] very small area that is actually open at this point. I am actually very pleased with what we are seeing today. 03-26-2023 upon evaluation today patient appears to be doing well currently in regard to his leg which is actually showing signs of being completely healed. Fortunately I do not see any signs of active infection locally or systemically which is great news and in general I do believe that we are moving in the right direction here. Readmission: 05-16-2023 upon evaluation today patient presents for reevaluation here in the clinic concerning [name] wound on the left anterior/lateral lower extremity. This is similar to where the wound was last time I saw him but not exactly the same. Fortunately there does not appear to be any signs of active infection at this time which is great news. The  patient's past medical history really has not changed significantly since last time I saw him. 05-24-2023 upon evaluation patient actually appears to be doing excellent in regard to his leg ulcer. He has been tolerating the dressing changes without complication in general I do feel like there were making excellent headway towards complete closure. I do not see any signs of active infection at this point. 8/29; left lateral lower leg in the setting of chronic venous insufficiency. We have been using Hydrofera Blue under Urgo K2. Wounds are making nice improvements 06-20-2023 upon evaluation today patient appears to be doing well currently in regard to his leg ulcer. This is actually showing signs of being significantly smaller compared even last week's nurse visit this looks much better. Fortunately I do not see any need for sharp debridement he seems to be doing quite well. 06-27-2023 upon evaluation today patient appears to be doing well currently in regard to his wound. He is tolerating the dressing changes without complication. There is [name] little bit of hypergranulation on the use of silver nitrate  today to help keep this under control. Fortunately I do not see any evidence of active infection locally or systemically which is great news. 07-04-2023 upon evaluation today patient appears to be doing well currently in regard to his wound. He has been tolerating the dressing changes without complication. Fortunately I do not see any signs of active infection locally or systemically which is great news and in general I do believe that he is making really good headway towards complete closure. This looks much better after starting the antibiotics. 07-09-2023 upon evaluation today patient appears to be doing excellent in regard to his leg which is actually significantly improved. I am very pleased with where we stand and I do believe that he is making really good headway towards getting this closed in fact it  pretty much appears to be almost closed today there is just [name] very tiny area that I think needs 1 week to toughen up before we cut him loose completely. 07-16-2023 upon evaluation today patient appears to be doing well currently in regard to his wounds. In fact everything is completely healed and looks to be doing great. Fortunately I do not see any signs of active infection at this time which is great news. No fevers, chills, nausea, vomiting, or diarrhea. 07-30-2023 upon evaluation today patient unfortunately has [name] breakdown of the wound on the lateral aspect of his leg. Unfortunately I am [name] little concerned about the overall appearance of the leg I feel like he may require [name] biopsy to further evaluate the situation. I discussed that with the patient today. With that being said I previously been asked by Dr. Adolphus Birchwood not to perform any of the biopsies myself but to let them see it prior to the biopsy. For that reason I would go ahead and see about making referral to Dr. Durene Cal office to get this scheduled as quickly as possible is my hope. We reached out to them have not heard back as of yet. 08-08-2023 upon evaluation today patient's wound actually showed signs of doing really about the same with regard to his wound. There does not appear to be any signs of active infection at this time which is good news with that being said they did not biopsy this at the office today when he went to dermatology. This is unfortunate as I thought that we can do that now. I will get [name] try to see if we get in touch with Dr. Durene Cal office if were not able to get this done before I see him next week, going to just do the biopsy myself just to make sure we know what we are dealing with patient voiced understanding he is in agreement with the plan. 08-15-2023 upon evaluation today patient appears to be doing pretty well currently in regard to his wound which is okay he did have [name] biopsy today apparently [name], [name]  ([id number]) [id number] Page 11 of 13 they had [name] hard time getting it to stop bleeding that had [name] pressure dressing on pretty tightly the good news is hemostasis has been achieved. With that being said the patient tells me that he unfortunately when they unwrapped him had several areas that were somewhat pussy and opened in regard to his leg I think he may have [name] bit of an infection ensuing again already. 08-22-2023 upon evaluation today patient's leg appears to be somewhat better in regards to the infection but unfortunately is still having some issues here with the wound the good news  is Dr. Durene Cal report did come to me and the patient did have [name] staph infection but otherwise did not have any signs of cancer pathology. 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 08-29-2023 upon evaluation today patient's leg is actually showing signs of significant improvement. Fortunately I see no evidence of worsening overall and I do believe that he is making good headway here towards closure. Objective Constitutional Well-nourished and well-hydrated in no acute distress. Vitals Time Taken: 10:28 AM, Height: 75 in, Weight: 220 lbs, BMI: 27.5, Temperature: 98.3 F, Pulse: 47 bpm, Respiratory Rate: 18 breaths/min, Blood Pressure: 147/67 mmHg. Respiratory normal breathing without difficulty. Psychiatric this patient is able to make decisions and demonstrates good insight into disease process. Alert and Oriented x 3. pleasant and cooperative. General Notes: Upon inspection patient's wound bed actually showed signs of good granulation epithelization at this point. Fortunately I do not see any signs of infection at this time which is good news and in general I do believe there were making good headway here towards closure I think that his leg is finally turning the corner this time again I am going to perform some silver nitrate chemical cauterization of the  hypergranulation tissue and he is in agreement with this plan. Integumentary (Hair, Skin) Wound #21R status is Open. Original cause of wound was Gradually Appeared. The date acquired was: 04/08/2023. The wound has been in [name] 15 weeks. The wound is located on the Left,Lateral Lower Leg. The wound measures 3.3cm length x 2.5cm width x 0.1cm depth; 6.48cm^2 area and 0.648cm^3 volume. There is [name] none present amount of drainage noted. 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 An CHEM CAUT GRANULATION TISS procedure was performed by [name] Derry, PA-C. Post procedure Diagnosis Wound #21R: Same as Pre-Procedure Plan Follow-up Appointments: Return Appointment in 1 week. Nurse Visit as needed Bathing/ Shower/ Hygiene: May shower; gently cleanse wound with antibacterial soap, rinse and pat dry prior to dressing wounds Edema Control - Orders / Instructions: Elevate, Exercise Daily and Avoid Standing for Long Periods of Time. Elevate leg(s) parallel to the floor when sitting. Medications-Please add to medication list.: [name], [name] ([id number]) [id number] Page 12 of 13 P.O. Antibiotics - continue Bactrim as directed WOUND #21R: - Lower Leg Wound Laterality: Left, Lateral Cleanser: Soap and Water 1 x Per Week/30 Days Discharge Instructions: Gently cleanse wound with antibacterial soap, rinse and pat dry prior to dressing wounds Cleanser: Wound Cleanser 1 x Per Week/30 Days Discharge Instructions: Wash your hands with soap and water.  Remove old dressing, discard into plastic bag and place into trash. Cleanse the wound with Wound Cleanser prior to applying [name] clean dressing using gauze sponges, not tissues or cotton balls. Do not scrub or use excessive force. Pat dry using gauze sponges, not tissue or cotton balls. Topical: Triamcinolone Acetonide Cream, 0.1%, 15 (g) tube 1 x Per Week/30 Days Discharge Instructions: Apply to entire lower leg Prim Dressing: Hydrofera Blue Ready Transfer Foam, 2.5x2.5 (in/in) 1 x Per Week/30 Days ary Discharge Instructions: Apply Hydrofera Blue Ready to wound bed as directed Prim Dressing: Curad Oil Emulsion Dressing 3x3 (in/in) 1 x Per Week/30 Days  ary Discharge Instructions: prior to HB Secondary Dressing: Zetuvit Plus 4x8 (in/in) 1 x Per Week/30 Days Com pression Wrap: Urgo K2, two layer compression system, regular 1 x Per Week/30 Days 1. I would recommend that the patient should continue to monitor for any signs of infection or worsening at this point. I do believe that the oil emulsion followed by the [facility] is doing [name] good job. 2. I am also going to recommend the patient should continue to monitor for any signs of infection or worsening if anything changes he knows to contact the office and let me know. We will see patient back for reevaluation in 1 week here in the clinic. If anything worsens or changes patient will contact our office for additional recommendations. Electronic Signature(s) Signed: 08/29/2023 5:52:05 PM By: [name] Derry PA-C Entered By: [name] on 08/29/2023 17:52:05 -------------------------------------------------------------------------------- SuperBill Details Patient Name: Date of Service: [name], [name] 08/29/2023 Medical Record Number: [id number] Patient Account Number: [id number] Date of Birth/Sex: Treating RN: September 28, 1946 (77 y.o. [name] Primary Care Provider: Mila [name] Other Clinician: Referring Provider: Treating  Provider/Extender: [name]: 15 Diagnosis Coding ICD-10 Codes Code Description I89.0 Lymphedema, not elsewhere classified 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 L97.822 Non-pressure chronic ulcer of other part of left lower leg with fat layer exposed I73.89 Other specified peripheral vascular diseases I48.0 Paroxysmal atrial fibrillation I10 Essential (primary) hypertension Facility Procedures : CPT4 Code: [id number] Description: 17250 - CHEM CAUT GRANULATION TISS ICD-10 Diagnosis Description L97.822 Non-pressure chronic ulcer of other part of left lower leg with fat layer expose Modifier: d Quantity: 1 Physician Procedures : CPT4 Code Description Modifier [phone] 17250 - WC PHYS CHEM [address] TISSUE [name], [name] ([id number]) [id number] Page ICD-10 Diagnosis Description [phone] Non-pressure chronic ulcer of other part of left lower leg with  fat layer exposed Quantity: 1 13 of 13 Electronic Signature(s) Signed: 08/29/2023 5:52:14 PM By: [name] Derry PA-C Entered By: [name] on 08/29/2023 17:52:13

## 2023-09-02 ENCOUNTER — Other Ambulatory Visit: Payer: Self-pay | Admitting: Cardiovascular Disease

## 2023-09-03 DIAGNOSIS — I480 Paroxysmal atrial fibrillation: Secondary | ICD-10-CM | POA: Diagnosis not present

## 2023-09-03 DIAGNOSIS — I7389 Other specified peripheral vascular diseases: Secondary | ICD-10-CM | POA: Diagnosis not present

## 2023-09-03 DIAGNOSIS — I87333 Chronic venous hypertension (idiopathic) with ulcer and inflammation of bilateral lower extremity: Secondary | ICD-10-CM | POA: Diagnosis not present

## 2023-09-03 DIAGNOSIS — Z85828 Personal history of other malignant neoplasm of skin: Secondary | ICD-10-CM | POA: Diagnosis not present

## 2023-09-03 DIAGNOSIS — Z7901 Long term (current) use of anticoagulants: Secondary | ICD-10-CM | POA: Diagnosis not present

## 2023-09-03 DIAGNOSIS — I890 Lymphedema, not elsewhere classified: Secondary | ICD-10-CM | POA: Diagnosis not present

## 2023-09-03 DIAGNOSIS — I10 Essential (primary) hypertension: Secondary | ICD-10-CM | POA: Diagnosis not present

## 2023-09-03 DIAGNOSIS — I482 Chronic atrial fibrillation, unspecified: Secondary | ICD-10-CM | POA: Diagnosis not present

## 2023-09-03 DIAGNOSIS — L97822 Non-pressure chronic ulcer of other part of left lower leg with fat layer exposed: Secondary | ICD-10-CM | POA: Diagnosis not present

## 2023-09-05 NOTE — Progress Notes (Signed)
CLARON, CAPPELL [name] ([id number]) [id number] Page 1 of 2 Visit Report for 09/03/2023 Physician Orders Details Patient Name: Date of Service: [name] 09/03/2023 11:00 A M Medical Record Number: [id number] Patient Account Number: [id number] Date of Birth/Sex: Treating RN: [birth year] (77 y.o. [name] Primary Care Provider: Mila [name] Other Clinician: Referring Provider: Treating Provider/Extender: [name] in Treatment: 15 The following information was scribed by: [name] The information was scribed for: [name] Verbal / Phone Orders: No Diagnosis Coding Follow-up Appointments Return Appointment in 1 week. Nurse Visit as needed Bathing/ Shower/ Hygiene May shower; gently cleanse wound with antibacterial soap, rinse and pat dry prior to dressing wounds Edema Control - Orders / Instructions Elevate, Exercise Daily and A void Standing for Long Periods of Time. Elevate leg(s) parallel to the floor when sitting. Medications-Please add to medication list. ntibiotics - continue Bactrim as directed P.O. A Wound Treatment Wound #21R - Lower Leg Wound Laterality: Left, Lateral Cleanser: Soap and Water 1 x Per Week/30 Days Discharge Instructions: Gently cleanse wound with antibacterial soap, rinse and pat dry prior to dressing wounds Cleanser: Wound Cleanser 1 x Per Week/30 Days Discharge Instructions: Wash your hands with soap and water. Remove old dressing, discard into plastic bag and place into trash. Cleanse the wound with Wound Cleanser prior to applying a clean dressing using gauze sponges, not tissues or cotton balls. Do not scrub or use excessive force. Pat dry using gauze sponges, not tissue or cotton balls. Topical: Triamcinolone Acetonide Cream, 0.1%, 15 (g) tube 1 x Per Week/30 Days Discharge Instructions: Apply to entire lower leg Prim Dressing: Hydrofera Blue Ready Transfer Foam, 2.5x2.5 (in/in) 1 x  Per Week/30 Days ary Discharge Instructions: Apply Hydrofera Blue Ready to wound bed as directed Prim Dressing: Curad Oil Emulsion Dressing 3x3 (in/in) ary 1 x Per Week/30 Days Discharge Instructions: prior to HB Secondary Dressing: Zetuvit Plus 4x8 (in/in) 1 x Per Week/30 Days Compression Wrap: Urgo K2, two layer compression system, regular 1 x Per Week/30 Days Electronic Signature(s) Signed: 09/03/2023 5:34:59 PM By: [name] Derry PA-C Signed: 09/04/2023 4:53:00 PM By: [name] Aver MSN RN CNS WTA Entered By: [name] on 09/03/2023 11:56:56 [name] [name] ([id number]) [id number] Page 2 of 2 -------------------------------------------------------------------------------- SuperBill Details Patient Name: Date of Service: [name] 09/03/2023 Medical Record Number: [id number] Patient Account Number: [id number] Date of Birth/Sex: Treating RN: 25-Sep-1946 (77 y.o. [name] Primary Care Provider: Mila [name] Other Clinician: Referring Provider: Treating Provider/Extender: [name] in Treatment: 15 Diagnosis Coding ICD-10 Codes Code Description I89.0 Lymphedema, not elsewhere classified 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 L97.822 Non-pressure chronic ulcer of other part of left lower leg with fat layer exposed I73.89 Other specified peripheral vascular diseases I48.0 Paroxysmal atrial fibrillation I10 Essential (primary) hypertension Facility Procedures : CPT4 Code: [id number] Description: (Facility Use Only) [phone] - APPLY MULTLAY COMPRS LWR LT LEG Modifier: Quantity: 1 Electronic Signature(s) Signed: 09/03/2023 5:34:59 PM By: [name] Derry PA-C Signed: 09/04/2023 4:53:00 PM By: [name] Aver MSN RN CNS WTA Entered By: [name] on 09/03/2023 11:57:36

## 2023-09-05 NOTE — Progress Notes (Signed)
AZREAL, RIDGELL R ([id number]) [id number] Page 1 of 3 Visit Report for 09/03/2023 Arrival Information Details Patient Name: Date of Service: [name] 09/03/2023 11:00 A M Medical Record Number: [id number] Patient Account Number: [id number] Date of Birth/Sex: Treating RN: [birth year] (77 y.o. [name] Primary Care Anni Hocevar: [name] Other Clinician: Referring Haston Casebolt: Treating Giovan Pinsky/Extender: [name] in Treatment: 15 Visit Information History Since Last Visit Added or deleted any medications: No Patient Arrived: Ambulatory Any new allergies or adverse reactions: No Arrival Time: 11:30 Has Dressing in Place as Prescribed: Yes Accompanied By: self Has Compression in Place as Prescribed: Yes Transfer Assistance: None Pain Present Now: No Patient Identification Verified: Yes Secondary Verification Process Completed: Yes Patient Requires Transmission-Based Precautions: No Patient Has Alerts: No Electronic Signature(s) Signed: 09/04/2023 4:53:00 PM By: [name] MSN RN CNS WTA Entered By: [name] on 09/03/2023 11:55:51 -------------------------------------------------------------------------------- Compression Therapy Details Patient Name: Date of Service: [name], [name] 09/03/2023 11:00 A M Medical Record Number: [id number] Patient Account Number: [id number] Date of Birth/Sex: Treating RN: May 11, 1946 (77 y.o. [name] Primary Care Devondre Guzzetta: [name] Other Clinician: Referring Arielys Wandersee: Treating Michele Judy/Extender: [name] in Treatment: 15 Compression Therapy Performed for Wound Assessment: Wound #21R Left,Lateral Lower Leg Performed By: Clinician [name], RN Compression Type: Four Layer Electronic Signature(s) Signed: 09/04/2023 4:53:00 PM By: [name] MSN RN CNS WTA Entered By: [name] on 09/03/2023 11:56:23 JOHNIE, FRANCZYK R ([id number])  [id number] Page 2 of 3 -------------------------------------------------------------------------------- Encounter Discharge Information Details Patient Name: Date of Service: [name] 09/03/2023 11:00 A M Medical Record Number: [id number] Patient Account Number: [id number] Date of Birth/Sex: Treating RN: 08-06-1946 (77 y.o. [name] Primary Care Vaniya Augspurger: [name] Other Clinician: Referring Anddy Wingert: Treating Latania Bascomb/Extender: [name] in Treatment: 15 Encounter Discharge Information Items Discharge Condition: Stable Ambulatory Status: Ambulatory Discharge Destination: Home Transportation: Private Auto Accompanied By: self Schedule Follow-up Appointment: Yes Clinical Summary of Care: Electronic Signature(s) Signed: 09/04/2023 4:53:00 PM By: [name] MSN RN CNS WTA Entered By: [name] on 09/03/2023 11:57:20 -------------------------------------------------------------------------------- Wound Assessment Details Patient Name: Date of Service: [name], [name] 09/03/2023 11:00 A M Medical Record Number: [id number] Patient Account Number: [id number] Date of Birth/Sex: Treating RN: 10-11-1945 (77 y.o. [name] Primary Care Sindee Stucker: [name] Other Clinician: Referring Korde Jeppsen: Treating Dvon Jiles/Extender: [name] in Treatment: 15 Wound Status Wound Number: 21R Primary Etiology: Vasculitis Wound Location: Left, Lateral Lower Leg Wound Status: Open Wounding Event: Gradually Appeared Comorbid History: Arrhythmia, Hypertension, Gout Date Acquired: 04/08/2023 Weeks Of Treatment: 15 Clustered Wound: No Wound Measurements Length: (cm) 3.3 Width: (cm) 2.5 Depth: (cm) 0.1 Area: (cm) 6.48 Volume: (cm) 0.648 % Reduction in Area: 52.9% % Reduction in Volume: 52.8% Wound Description Classification: Full Thickness Without Exposed Support Structures JACY, KRUKOWSKI R ([id number]) Exudate Amount: None Present [id number] Page 3 of 3 Treatment Notes Wound #21R (Lower Leg) Wound Laterality: Left, Lateral Cleanser Soap and Water Discharge Instruction: Gently cleanse wound with antibacterial soap, rinse and pat dry prior to dressing wounds Wound Cleanser Discharge Instruction: Wash your hands with soap and water. Remove old dressing, discard into plastic bag and place into trash. Cleanse the wound with Wound Cleanser prior to applying a clean dressing using gauze sponges, not tissues or cotton balls. Do not scrub or use excessive force. Pat dry using gauze sponges, not tissue or cotton  balls. Peri-Wound Care Topical Triamcinolone Acetonide Cream, 0.1%, 15 (g) tube Discharge Instruction: Apply to entire lower leg Primary Dressing Hydrofera Blue Ready Transfer Foam, 2.5x2.5 (in/in) Discharge Instruction: Apply Hydrofera Blue Ready to wound bed as directed Curad Oil Emulsion Dressing 3x3 (in/in) Discharge Instruction: prior to HB Secondary Dressing Zetuvit Plus 4x8 (in/in) Secured With Compression Wrap Urgo K2, two layer compression system, regular Compression Stockings Add-Ons Electronic Signature(s) Signed: 09/04/2023 4:53:00 PM By: [name] MSN RN CNS WTA Entered By: [name] on 09/03/2023 11:56:08

## 2023-09-07 ENCOUNTER — Other Ambulatory Visit: Payer: Self-pay | Admitting: Family Medicine

## 2023-09-11 ENCOUNTER — Encounter: Payer: Medicare PPO | Attending: Physician Assistant

## 2023-09-11 DIAGNOSIS — M109 Gout, unspecified: Secondary | ICD-10-CM | POA: Insufficient documentation

## 2023-09-11 DIAGNOSIS — I739 Peripheral vascular disease, unspecified: Secondary | ICD-10-CM | POA: Diagnosis not present

## 2023-09-11 DIAGNOSIS — I480 Paroxysmal atrial fibrillation: Secondary | ICD-10-CM | POA: Insufficient documentation

## 2023-09-11 DIAGNOSIS — I890 Lymphedema, not elsewhere classified: Secondary | ICD-10-CM | POA: Insufficient documentation

## 2023-09-11 DIAGNOSIS — I10 Essential (primary) hypertension: Secondary | ICD-10-CM | POA: Diagnosis not present

## 2023-09-11 DIAGNOSIS — I87333 Chronic venous hypertension (idiopathic) with ulcer and inflammation of bilateral lower extremity: Secondary | ICD-10-CM | POA: Diagnosis not present

## 2023-09-11 DIAGNOSIS — I482 Chronic atrial fibrillation, unspecified: Secondary | ICD-10-CM | POA: Diagnosis not present

## 2023-09-11 DIAGNOSIS — Z85828 Personal history of other malignant neoplasm of skin: Secondary | ICD-10-CM | POA: Insufficient documentation

## 2023-09-11 DIAGNOSIS — N400 Enlarged prostate without lower urinary tract symptoms: Secondary | ICD-10-CM | POA: Diagnosis not present

## 2023-09-11 DIAGNOSIS — I872 Venous insufficiency (chronic) (peripheral): Secondary | ICD-10-CM | POA: Diagnosis not present

## 2023-09-11 DIAGNOSIS — L97822 Non-pressure chronic ulcer of other part of left lower leg with fat layer exposed: Secondary | ICD-10-CM | POA: Diagnosis not present

## 2023-09-11 DIAGNOSIS — Z7901 Long term (current) use of anticoagulants: Secondary | ICD-10-CM | POA: Diagnosis not present

## 2023-09-11 NOTE — Progress Notes (Addendum)
AMAHRI, BONGO [name] ([id number]) [id number] Page 1 of 3 Visit Report for 09/11/2023 Arrival Information Details Patient Name: Date of Service: [name] Clementeen [name] 09/11/2023 8:45 A [name] Medical Record Number: [id number] Patient Account Number: [id number] Date of Birth/Sex: Treating RN: [birth year] (77 y.o. [name] Primary Care Han Lysne: [name] Other Clinician: Referring [name]: Treating [name] Visit Information History Since Last Visit Added or deleted any medications: No Patient Arrived: Ambulatory Any new allergies or adverse reactions: No Arrival Time: 08:50 Signs or symptoms of abuse/neglect since last visito No Accompanied By: self Hospitalized since last visit: No Transfer Assistance: None Has Dressing in Place as Prescribed: Yes Patient Identification Verified: Yes Has Compression in Place as Prescribed: Yes Secondary Verification Process Completed: Yes Pain Present Now: No Patient Requires Transmission-Based Precautions: No Patient Has Alerts: No Electronic Signature(s) Signed: 09/11/2023 9:46:05 AM By: [name] MSN RN CNS WTA Entered By: [name] on 09/11/2023 06:46:05 -------------------------------------------------------------------------------- Compression Therapy Details Patient Name: Date of Service: [name] [name], [name] 09/11/2023 8:45 A [name] Medical Record Number: [id number] Patient Account Number: [id number] Date of Birth/Sex: Treating RN: 07/15/1946 (77 y.o. [name] Primary Care [name]: [name] Other Clinician: Referring [name]: Treating [name]/Extender: [name] Compression Therapy Performed for Wound Assessment: Wound #21R Left,Lateral Lower Leg Performed By: Clinician [name], RN Compression Type: Four Layer Electronic Signature(s) Signed: 09/11/2023 9:46:44 AM By: [name] MSN RN  CNS WTA Entered By: [name] on 09/11/2023 06:46:43 [name] [id number] Page 2 of 3 -------------------------------------------------------------------------------- Encounter Discharge Information Details Patient Name: Date of Service: [name] Clementeen [name] 09/11/2023 8:45 A [name] Medical Record Number: [id number] Patient Account Number: [id number] Date of Birth/Sex: Treating RN: [birth year] (77 y.o. [name] Primary Care [name]: [name] Other Clinician: Referring Catherene Kaleta: Treating [name]/Extender: [name] Encounter Discharge Information Items Discharge Condition: Stable Ambulatory Status: Ambulatory Discharge Destination: Home Transportation: Private Auto Accompanied By: self Schedule Follow-up Appointment: Yes Clinical Summary of Care: Electronic Signature(s) Signed: 09/11/2023 9:48:18 AM By: [name] MSN RN CNS WTA Entered By: [name] on 09/11/2023 06:48:18 -------------------------------------------------------------------------------- Wound Assessment Details Patient Name: Date of Service: [name] [name], [name] 09/11/2023 8:45 A [name] Medical Record Number: [id number] Patient Account Number: [id number] Date of Birth/Sex: Treating RN: 06/14/1946 (77 y.o. [name] Primary Care Markiah Janeway: [name] Other Clinician: Referring Jaysa Kise: Treating Everlyn Farabaugh/Extender: [name] Wound Status Wound Number: 21R Primary Etiology: Vasculitis Wound Location: Left, Lateral Lower Leg Wound Status: Open Wounding Event: Gradually Appeared Date Acquired: 04/08/2023 Weeks Of Treatment: 16 Clustered Wound: No Wound Measurements Length: (cm) 3.3 Width: (cm) 2.5 Depth: (cm) 0.1 Area: (cm) 6.48 Volume: (cm) 0.648 % Reduction in Area: 52.9% % Reduction in Volume: 52.8% Wound Description Classification: Full Thickness Without Exposed  Support Structures [name] ([id number]) Exudate Amount: None Present [id number] Page 3 of 3 Treatment Notes Wound #21R (Lower Leg) Wound Laterality: Left, Lateral Cleanser Soap and Water Discharge Instruction: Gently cleanse wound with antibacterial soap, rinse and pat dry prior to dressing wounds Wound Cleanser Discharge Instruction: Wash your hands with soap and water. Remove old dressing, discard into plastic bag and place into trash. Cleanse the wound with Wound Cleanser prior to applying a clean dressing using gauze sponges, not tissues or cotton balls. Do not scrub or use excessive force.  Pat dry using gauze sponges, not tissue or cotton balls. Peri-Wound Care Topical Triamcinolone Acetonide Cream, 0.1%, 15 (g) tube Discharge Instruction: Apply to entire lower leg Primary Dressing Hydrofera Blue Ready Transfer Foam, 2.5x2.5 (in/in) Discharge Instruction: Apply Hydrofera Blue Ready to wound bed as directed Curad Oil Emulsion Dressing 3x3 (in/in) Discharge Instruction: prior to HB Secondary Dressing Zetuvit Plus 4x8 (in/in) Secured With Compression Wrap Urgo K2, two layer compression system, regular Compression Stockings Add-Ons Electronic Signature(s) Signed: 09/11/2023 11:59:16 AM By: [name] MSN RN CNS WTA Entered By: [name] on 09/11/2023 06:46:16

## 2023-09-12 NOTE — Progress Notes (Signed)
ALDIS, LAMORTE [name] ([id number]) [id number] Page 1 of 2 Visit Report for 09/11/2023 Physician Orders Details Patient Name: Date of Service: [name] 09/11/2023 8:45 A M Medical Record Number: [id number] Patient Account Number: [id number] Date of Birth/Sex: Treating RN: 1946/01/22 (77 y.o. [name] Primary Care Provider: [name] Other Clinician: Referring Provider: Treating Provider/Extender: [name] The following information was scribed by: [name] The information was scribed for: Allen Derry Verbal / Phone Orders: No Diagnosis Coding Follow-up Appointments Return Appointment in 1 week. Nurse Visit as needed Bathing/ Shower/ Hygiene May shower; gently cleanse wound with antibacterial soap, rinse and pat dry prior to dressing wounds Edema Control - Orders / Instructions Elevate, Exercise Daily and A void Standing for Long Periods of Time. Elevate leg(s) parallel to the floor when sitting. Medications-Please add to medication list. ntibiotics - continue Bactrim as directed P.O. A Wound Treatment Wound #21R - Lower Leg Wound Laterality: Left, Lateral Cleanser: Soap and Water 1 x Per Week/30 Days Discharge Instructions: Gently cleanse wound with antibacterial soap, rinse and pat dry prior to dressing wounds Cleanser: Wound Cleanser 1 x Per Week/30 Days Discharge Instructions: Wash your hands with soap and water. Remove old dressing, discard into plastic bag and place into trash. Cleanse the wound with Wound Cleanser prior to applying a clean dressing using gauze sponges, not tissues or cotton balls. Do not scrub or use excessive force. Pat dry using gauze sponges, not tissue or cotton balls. Topical: Triamcinolone Acetonide Cream, 0.1%, 15 (g) tube 1 x Per Week/30 Days Discharge Instructions: Apply to entire lower leg Prim Dressing: Hydrofera Blue Ready Transfer Foam, 2.5x2.5 (in/in) 1 x  Per Week/30 Days ary Discharge Instructions: Apply Hydrofera Blue Ready to wound bed as directed Prim Dressing: Curad Oil Emulsion Dressing 3x3 (in/in) ary 1 x Per Week/30 Days Discharge Instructions: prior to HB Secondary Dressing: Zetuvit Plus 4x8 (in/in) 1 x Per Week/30 Days Compression Wrap: Urgo K2, two layer compression system, regular 1 x Per Week/30 Days Electronic Signature(s) Signed: 09/11/2023 9:47:30 AM By: [name] MSN RN CNS WTA Signed: 09/12/2023 7:39:18 AM By: Allen Derry PA-C Entered By: [name] on 09/11/2023 09:47:29 [name] ([id number]) [id number] Page 2 of 2 -------------------------------------------------------------------------------- SuperBill Details Patient Name: Date of Service: [name] 09/11/2023 Medical Record Number: [id number] Patient Account Number: [id number] Date of Birth/Sex: Treating RN: 05-28-1946 (77 y.o. 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

## 2023-09-16 DIAGNOSIS — I482 Chronic atrial fibrillation, unspecified: Secondary | ICD-10-CM | POA: Diagnosis not present

## 2023-09-16 DIAGNOSIS — Z1211 Encounter for screening for malignant neoplasm of colon: Secondary | ICD-10-CM | POA: Diagnosis not present

## 2023-09-19 ENCOUNTER — Encounter: Payer: Medicare PPO | Admitting: Physician Assistant

## 2023-09-19 DIAGNOSIS — I872 Venous insufficiency (chronic) (peripheral): Secondary | ICD-10-CM | POA: Diagnosis not present

## 2023-09-19 DIAGNOSIS — I87333 Chronic venous hypertension (idiopathic) with ulcer and inflammation of bilateral lower extremity: Secondary | ICD-10-CM | POA: Diagnosis not present

## 2023-09-19 DIAGNOSIS — I739 Peripheral vascular disease, unspecified: Secondary | ICD-10-CM | POA: Diagnosis not present

## 2023-09-19 DIAGNOSIS — L958 Other vasculitis limited to the skin: Secondary | ICD-10-CM | POA: Diagnosis not present

## 2023-09-19 DIAGNOSIS — M109 Gout, unspecified: Secondary | ICD-10-CM | POA: Diagnosis not present

## 2023-09-19 DIAGNOSIS — I10 Essential (primary) hypertension: Secondary | ICD-10-CM | POA: Diagnosis not present

## 2023-09-19 DIAGNOSIS — I890 Lymphedema, not elsewhere classified: Secondary | ICD-10-CM | POA: Diagnosis not present

## 2023-09-19 DIAGNOSIS — L97822 Non-pressure chronic ulcer of other part of left lower leg with fat layer exposed: Secondary | ICD-10-CM | POA: Diagnosis not present

## 2023-09-19 DIAGNOSIS — I480 Paroxysmal atrial fibrillation: Secondary | ICD-10-CM | POA: Diagnosis not present

## 2023-09-19 DIAGNOSIS — I482 Chronic atrial fibrillation, unspecified: Secondary | ICD-10-CM | POA: Diagnosis not present

## 2023-09-19 NOTE — Progress Notes (Addendum)
[name], [name] ([id number]) [id number] Page 1 of 13 Visit Report for 09/19/2023 Chief Complaint Document Details Patient Name: Date of Service: [name] 09/19/2023 10:15 [name] M Medical Record Number: [id number] Patient Account Number: [id number] Date of Birth/Sex: Treating RN: 1945-11-28 (77 y.o. [name] Primary Care Provider: [name] Other Clinician: Referring Provider: Treating Provider/Extender: [name] in Treatment: 18 Information Obtained from: Patient Chief Complaint Left LE Ulcer Electronic Signature(s) Signed: 09/19/2023 10:26:04 AM By: Allen Derry PA-C Entered By: Allen Derry on 09/19/2023 10:26:03 -------------------------------------------------------------------------------- Debridement Details Patient Name: Date of Service: [name], [name] 09/19/2023 10:15 [name] M Medical Record Number: [id number] Patient Account Number: [id number] Date of Birth/Sex: Treating RN: June 17, 1946 (77 y.o. [name] Primary Care Provider: [name] Other Clinician: Referring Provider: Treating Provider/Extender: [name] in Treatment: 18 Debridement Performed for Assessment: Wound #21R Left,Lateral Lower Leg Performed By: Physician Allen Derry, PA-C The following information was scribed by: [name] The information was scribed for: Allen Derry Debridement Type: Debridement Level of Consciousness (Pre-procedure): Awake and Alert Pre-procedure Verification/Time Out Yes - 10:42 Taken: Start Time: 10:42 Pain Control: Lidocaine 4% T opical Solution Percent of Wound Bed Debrided: 10% T Area Debrided (cm): otal 0.54 Tissue and other material debrided: Viable, Non-Viable, Slough, Subcutaneous, Slough Level: Skin/Subcutaneous Tissue Debridement Description: Excisional Instrument: Curette Bleeding: Minimum Hemostasis Achieved: Pressure [name], [name]  [id number] Page 2 of 13 Procedural Pain: 0 Post Procedural Pain: 0 Response to Treatment: Procedure was tolerated well Level of Consciousness (Post- Awake and Alert procedure): Post Debridement Measurements of Total Wound Length: (cm) 3 Width: (cm) 2.3 Depth: (cm) 1 Volume: (cm) 5.419 Character of Wound/Ulcer Post Debridement: Stable Post Procedure Diagnosis Same as Pre-procedure Electronic Signature(s) Signed: 09/19/2023 4:38:12 PM By: Allen Derry PA-C Signed: 09/19/2023 5:08:46 PM By: [name] MSN RN CNS WTA Entered By: [name] on 09/19/2023 10:43:18 -------------------------------------------------------------------------------- HPI Details Patient Name: Date of Service: [name] [name], [name] 09/19/2023 10:15 [name] M Medical Record Number: [id number] Patient Account Number: [id number] Date of Birth/Sex: Treating RN: 04-18-1946 (77 y.o. 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 the patient has had [name] vascular workup over 7 years ago and said everything was normal at that stage. He does not have any chronic problems except for cardiac issues which he sees [name] cardiologist in [place]. 08/15/2017 -- arterial and venous duplex studies still pending. 08/23/2017 -- venous reflux studies done on 08/13/2017 shows venous incompetence throughout the left lower extremity deep system and focally at the  left saphenofemoral junction. 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 No evidence of SVT or DVT in bilateral lower extremities The patient has an appointment at the end of the month to get his arterial duplex study done 09/05/2017 -- the patient was seen at the vein and vascular office yesterday by Bary Castilla. ABI studies were notable for medial calcification and the toe brachial indices were normal and bilateral ankle-brachial) waveforms were normal with triphasic flow. After review of his venous studies  he was not [name] candidate for laser ablation and his lymphedema was to be treated with compression stockings and lymphedema pump pumps 09/12/2017 -- had [name] low arterial study done at the [facility] -- unable to obtain reliable ABI is due to medial calcification. Bilateral toe indices were normal with the right being 1.01 and the left being 0.92 and the waveforms were triphasic bilaterally. he did get hold of 30-40 mm compression stockings but is unable to put these on. We will try and get him alternative compression stockings. 09/26/17- he is here in follow up evaluation of [name] right lower extremity ulcer;he is compliant in wearing compression stocking; ulcer almost epithelialized , anticipate healing next appointment Readmission: 11/17 point upon evaluation patient's wound currently that he is seeing [place] for today is [name] skin cancerous lesion that was cleared away by his dermatologist on the left medial calf region. He tells me that this is [name] very similar thing to what he had done previously in fact the last time he saw him in 2018 this was also what was going on at that point. Nonetheless he feels that based on what he seeing currently that this is just having [name] lot of harder time healing although it is much closer to the surface than what he is experienced in the past. He notes that the initial removal was in June 2022 which was this year this is  now November and still has not closed. He does have some edema and definitely I think that there is some venous component to his slow healing here. Also think that we can do something better than Vaseline to try to help with getting this to clear up as quickly as possible. He does have [name] history of atrial fibrillation and is on Eliquis otherwise he really has no major medical problems that would affect wound healing. [name], [name] ([id number]) [id number] Page 3 of 13 09/07/2021 upon evaluation today patient actually appears to be doing significantly better after having wrapped him last week. Overall I think that this is making significant improvements at this time which is great news. I do not see any evidence of infection which is great news as well. No fevers, chills, nausea, vomiting, or diarrhea. 09/14/2021 upon evaluation today patient appears to be doing well with regard to his leg ulcer. He has been tolerating the dressing changes and overall I think that he is making excellent progress. I do not see any signs of active infection at this time. 09/21/2021 upon evaluation today patient actually appears to be making good progress with regard to his wound this is again measuring smaller today no debridement seems to be necessary. We have been using [name] silver collagen dressing and I think that is doing an awesome job. 09/28/2021 upon evaluation today patient appears to be doing well with regard to his leg currently. I do not see any signs of active infection at this time which is great news. No fevers, chills, nausea, vomiting, or diarrhea. I think this wound is very close to complete resolution. 10/12/2021 upon evaluation today patient actually appears to be doing awesome in regard to his leg ulcer. In fact this appears to be completely healed based on what I am seeing currently. I do not see any evidence of active infection locally nor systemically at this time which is  also great news. No fevers, chills, nausea, vomiting, or diarrhea. Readmission: 12/07/2021 upon evaluation today patient presents for readmission here in the clinic. He was  discharged on 10/12/2021 is completely healed. Unfortunately this has reopened at this point and he is having continual issues with new blisters over both lower extremities. This is even worse than what we previously saw. Nonetheless we did actually check his ABIs today and it did reveal that his ABIs were 0.55 on the left and 0.57 on the right. Subsequently this is [name] definite change from his last arterial study which showed that he did have good blood flow at 1.01 on the right and 0.92 on the left and that was right at the beginning of 2019. Nonetheless based on what we see currently I do think he tolerated the 3 layer compression wrap but I do believe that we probably need to get him tested for his arterial flow in order to see where things stand and if there is something we can do there that would help prevent this from continue to be an ongoing issue. He did not utilize compression socks in the interim from when he was last here till this time. That something is probably going to need lifelong going forward as well. 3/9; patient presents for follow-up. He has no issues or complaints today. He tolerated the compression wrap well. He had ABIs with TBI's done. He denies signs of infection. 12/21/2021 upon evaluation today patient appears to be doing well with regard to the wounds on his legs. Both are showing signs of significant improvement which is great news although I do believe some sharp debridement would be of benefit here as well. 12/28/2021 upon evaluation today patient appears to be doing well with regard to his wounds. Everything is showing signs of excellent improvement which I am very pleased about. I think that we are headed in the right direction here. Fortunately there does not appear to be any evidence of infection  which is great news there is [name] little bit of hypergranulation. 01/04/2022 upon evaluation today patient appears to be doing well with regard to his wounds 2 of them are healed 1 is almost so and the other 1 is significantly better. Overall I am extremely pleased with where we stand and I think that he is making excellent progress here. I do not see any evidence of active infection locally nor systemically at this time. 01-16-2022 upon evaluation today patient's wound on the left leg is showing signs of doing quite well. Has not completely cleared at this point but it is much improved. Fortunately I do not see any signs of infection at this time. No fevers, chills, nausea, vomiting, or diarrhea. 01-23-2022 upon evaluation today patient's wound of the left leg actually appears to be pretty much completely healed which is great news. I do not see any signs of active infection locally or systemically which is excellent. With that being said on the right leg what wound is measuring smaller the other 1 is [name] new wound that just showed up fortunately its not too bad. Has been using Xeroform here and that seems to be doing decently well which is great news. Unfortunately his blood pressure is significantly high we gave him the readings for the past 4-5 visits as well as [name] recommendation to make an appointment to go discuss this with his primary care provider patient states that he is going to look into doing this. 01-30-2022 upon evaluation today patient appears to be doing well with regard to his left leg everything appears to be healed. On the right leg the more anterior wound is healed the more medial  wound that I been concerned about [name] possible skin cancer unfortunately still does not look great to me. I do believe that we should probably do [name] biopsy I have talked about it with him [name] few times I think though it is probably time to go ahead and do this at this point. 02-09-2022 upon evaluation today patient  appears to be doing well with regard to his legs. On the left this appears to be completely healed. On the right he does have 2 areas and be perfectly honest one of them is [name] skin cancer that he is going to the Mohs surgery clinic for the other seems to be healing nicely. Readmission: 08-02-2022 upon evaluation today patient appears for reevaluation here in our clinic concerning issues that he has been having with wounds over the bilateral lower extremities. I last saw him in May 2023 and at that point we had him completely healed. Unfortunately he is tells me this has broken down to some degree since that point. 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 He tells me this 1 seems to just be getting bigger and bigger not improving. Fortunately he is not having any significant pain which is good news he does have quite [name] bit of swelling and he tells me that his fluid pills are not recommended for him to take daily but just in 3-day intervals here and there. 08-09-2022 upon evaluation today patient appears to be doing still somewhat poorly in regard to his legs although in general he does not appear to be feeling as good as he has been. Fortunately there does not appear to be any signs of infection which is good news. With that being said he is having some issues here with having and overall poor feeling in general which again is good I think going to be the biggest complicating factor. He actually seems to be coughing I do not hear any wheezing right now I did listen to his chest he did not have good airflow down low however makes me suspicious for bronchitis or even possibly pneumonia which could be part of what is going on here as well. Fortunately I do not see any evidence of anything worsening in regard  to his legs but I definitely believe that he needs to continue with the compression wraps he took them off yesterday to shower has not had anything on for 24 hours that is why his legs are so swollen today. With regard to his pathology report I did review that it showed some squamous abnormality but no signs of distinct carcinoma. With that being said it was saying that it could be adjacent to [name] squamous cell carcinoma nonetheless my suggestion is can be that we have the patient take copy of this report and give it to his Mohs surgeon in order for them to see if there is anything they feel like needs to be done further. With that being said right now I feel like the primary thing is going to be for [place] to try to get his swelling down and keep that down into that hand since he is having so much drainage I believe we can have to bring him in for dressing changes twice [name] week doing [name] nurse visit on Mondays. 11/9; since  the patient was last here he spent the night in the emergency room he received IV Lasix. Also received antibiotics although he was not discharged on either 1 of these. He also saw his cardiology office who put him on regular Lasix 20 mg [previously on as needed Lasix 20 mg]. Per our intake nurse the swelling in his legs is remarkably better but he still has bilateral lower extremity wounds. He still has wounds on the bilateral lower extremities most problematically on the left lateral calf. He has been using silver alginate under 3 layer compression. 08-23-2022 upon evaluation today patient appears to be doing much better than the last time I saw him 2 weeks ago. At that point I was very concerned about how he was doing he did see Dr. Sherrie Mustache his primary care provider they got him on some blood pressure medication in general his color and overall appearance looks to be doing much improved compared to the last time I saw him. 09-04-2022 upon evaluation today patient appears to be doing well  currently in regard to his wounds. Everything is showing signs of improvement which is great [name], [name] ([id number]) [id number] Page 4 of 13 news. Fortunately there does not appear to be any signs of active infection locally or systemically at this time. No fevers, chills, nausea, vomiting, or diarrhea. 09-10-2022 upon evaluation today patient appears to be doing better in regard to his wounds although the [facility] was extremely stuck to the wound bed. Fortunately there does not appear to be any signs of infection locally or systemically at this time which is great news. No fevers, chills, nausea, vomiting, or diarrhea. 09-17-2022 upon evaluation today patient appears to be doing well currently in regard to his wounds in general. The right leg actually showing signs of excellent improvement and very pleased with where things stand in that regard. Fortunately I do not see any evidence of infection locally or systemically at this time which is great news. No fevers, chills, nausea, vomiting, or diarrhea. 09-24-2022 upon evaluation today patient appears to be doing well currently in regard to his wounds. Things look to be doing quite well. 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 He is seeing his dermatologist tomorrow in that regard. With that being said other than that however he seems to really be making some pretty good progress here which is good news. No fevers, chills, nausea, vomiting, or diarrhea. 12/26; the patient has 2 open wounds remaining on the left leg. One is on the left anterior mid tibia and the other is on the right lateral knee just outside of the popliteal fossa. 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 The patient has been to see dermatology Dr. Adolphus Birchwood who apparently is  making him [name] referral to the [facility] center. He does not yet have an appointment 10-12-2022 upon evaluation today patient appears to be doing well currently in regard to his wound. He has been tolerating the dressing changes without complication and overall feel like we are headed in the right direction. Fortunately I do not see any signs of infection locally or systemically at this time which is great news. No fevers, chills, nausea, vomiting, or diarrhea. 10-23-2022 upon evaluation today patient appears to be doing well currently in regard to his wound. He has been tolerating the  dressing changes without complication. Fortunately there does not appear to be any signs of active infection locally nor systemically which is great news and overall I am extremely pleased with where we stand currently. No fevers, chills, nausea, vomiting, or diarrhea. 10-26-2022 upon evaluation today patient appears to be doing well currently in regard to his wounds. Everything is showing signs of improvement and this is great news. Fortunately I see no evidence of active infection systemically. He does seem to be doing much better in regard to the local infection in regard to his leg. The smell is also greatly improved. Overall I am extremely happy with where we stand today. This is after just [name] few days with the antibiotic on board. 11-05-2022 upon evaluation today patient appears to be doing well currently in regard to his wounds although the wound where they performed the Mohs surgery does look [name] little bit hyper granulated I think switching to [facility] may be better for him. He voiced understanding. Fortunately there does not appear to be any evidence of active infection locally nor systemically at this time. 11-12-2022 upon evaluation today patient appears to be doing better in regard to both wounds he has been tolerating the dressing changes without complication. There is no signs of infection and in  general I think you are doing quite well. No fevers, chills, nausea, vomiting, or diarrhea. 11-20-2022 upon evaluation today patient appears to be doing well currently in regard to his wounds. He has been tolerating the dressing changes without complication. Fortunately there does not appear to be any signs of infection at this time. No fevers, chills, nausea, vomiting, or diarrhea. 11-27-2022 upon evaluation today patient appears to be doing somewhat poorly in regard to his leg in general he has [name] lot of areas where he looks like he had some spots that popped up. There with regard to new possible blisters. In general I am actually very concerned about the fact that the wrap may be causing some irritation here. I think that we can try to not do the wrap for 1 week, and given the prescription for mupirocin ointment which I will send into the pharmacy for him. 12-04-2022 upon evaluation today patient appears to be doing well currently in regard to his wounds in fact he appears to be pretty much completely healed based on what I am seeing at this point. I do not see any signs of active infection locally nor systemically at this time which is great news. No fevers, chills, nausea, vomiting, or diarrhea. 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 I discussed that with him today he is aware of the situation.-2024 upon evaluation patient presents for readmission here in the clinic actually last saw him December 04, 2022. With that being said previously his dermatologist had asked that if there was something the need to be addressed from [name] dermatology standpoint and specifically biopsy that we make referral back to them to allow them to do it which I am definitely happy to do. 01-31-2023 upon evaluation today patient appears to be doing well currently in  regard to his wound in fact this looks better he did see Dr. Adolphus Birchwood his dermatologist yesterday and they did perform [name] biopsy. With that being said he actually looks like he is doing much better Dr. Adolphus Birchwood feels like this may  not be [name] cancerous lesion which will be very good news at the same time I definitely wanted to make sure especially considering his history and the way this looks when we saw him last week. Nonetheless I am extremely pleased with the fact that he is doing so much better at this point. 02-07-2023 upon evaluation today patient appears to be doing well currently in regard to his wound from the standpoint of size it has not gotten any larger. With that being said he does have some issues here still with what appears to be potentially some infection. Wound does not look quite as good as it did last week. We are still waiting on the results for the pathology. I did put [name] call into dermatology but I have not heard anything back from them as of yet. 02-14-2023 upon evaluation today patient appears to be doing well with regard to his wound infection is definitely under control and looks much better. Fortunately I do not see any signs of systemic infection with anyone locally I think this is improved greatly. 02-19-2023 upon evaluation today patient appears to be doing well currently in regard to his wound which is actually measuring smaller and looking much better. Fortunately I do not see any evidence of active infection locally nor systemically which is great news and overall I am extremely pleased with where we stand today. 02-26-2023 upon evaluation today patient appears to be doing well currently in regard to his wound. He is actually been tolerating the dressing changes without complication. Fortunately there does not appear to be any signs of active infection locally nor systemically which is great news and overall I am extremely pleased with where we stand currently. 03-12-2023 upon  evaluation today patient's wound actually showed signs of excellent improvement. I am very pleased with where we stand I do believe that we are making good progress here. I do not see any signs of active infection. 03-19-2023 upon evaluation today patient actually appears to be making excellent progress in regard to his leg and getting this closed. In fact is just [name] very small area that is actually open at this point. I am actually very pleased with what we are seeing today. 03-26-2023 upon evaluation today patient appears to be doing well currently in regard to his leg which is actually showing signs of being completely healed. Fortunately I do not see any signs of active infection locally or systemically which is great news and in general I do believe that we are moving in the right direction here. Readmission: 05-16-2023 upon evaluation today patient presents for reevaluation here in the clinic concerning [name] wound on the left anterior/lateral lower extremity. This is similar to where the wound was last time I saw him but not exactly the same. Fortunately there does not appear to be any signs of active infection at this time which [name], [name] ([id number]) [id number] Page 5 of 13 is great news. The patient's past medical history really has not changed significantly since last time I saw him. 05-24-2023 upon evaluation patient actually appears to be doing excellent in regard to his leg ulcer. He has been tolerating the dressing changes without complication in general I do feel like there were making excellent headway towards complete closure. I do not see any signs of active infection at this point. 8/29; left lateral lower leg in the setting of chronic venous insufficiency. We have been using Hydrofera Blue under Urgo K2. Wounds are making nice improvements 06-20-2023 upon  evaluation today patient appears to be doing well currently in regard to his leg ulcer. This is  actually showing signs of being significantly smaller compared even last week's nurse visit this looks much better. Fortunately I do not see any need for sharp debridement he seems to be doing quite well. 06-27-2023 upon evaluation today patient appears to be doing well currently in regard to his wound. He is tolerating the dressing changes without complication. There is [name] little bit of hypergranulation on the use of silver nitrate today to help keep this under control. Fortunately I do not see any evidence of active infection locally or systemically which is great news. 07-04-2023 upon evaluation today patient appears to be doing well currently in regard to his wound. He has been tolerating the dressing changes without complication. Fortunately I do not see any signs of active infection locally or systemically which is great news and in general I do believe that he is making really good headway towards complete closure. This looks much better after starting the antibiotics. 07-09-2023 upon evaluation today patient appears to be doing excellent in regard to his leg which is actually significantly improved. I am very pleased with where we stand and I do believe that he is making really good headway towards getting this closed in fact it pretty much appears to be almost closed today there is just [name] very tiny area that I think needs 1 week to toughen up before we cut him loose completely. 07-16-2023 upon evaluation today patient appears to be doing well currently in regard to his wounds. In fact everything is completely healed and looks to be doing great. Fortunately I do not see any signs of active infection at this time which is great news. No fevers, chills, nausea, vomiting, or diarrhea. 07-30-2023 upon evaluation today patient unfortunately has [name] breakdown of the wound on the lateral aspect of his leg. Unfortunately I am [name] little concerned about the overall appearance of the leg I feel like he may  require [name] biopsy to further evaluate the situation. I discussed that with the patient today. With that being said I previously been asked by Dr. Adolphus Birchwood not to perform any of the biopsies myself but to let them see it prior to the biopsy. For that reason I would go ahead and see about making referral to Dr. Durene Cal office to get this scheduled as quickly as possible is my hope. We reached out to them have not heard back as of yet. 08-08-2023 upon evaluation today patient's wound actually showed signs of doing really about the same with regard to his wound. There does not appear to be any signs of active infection at this time which is good news with that being said they did not biopsy this at the office today when he went to dermatology. This is unfortunate as I thought that we can do that now. I will get [name] try to see if we get in touch with Dr. Durene Cal office if were not able to get this done before I see him next week, going to just do the biopsy myself just to make sure we know what we are dealing with patient voiced understanding he is in agreement with the plan. 08-15-2023 upon evaluation today patient appears to be doing pretty well currently in regard to his wound which is okay he did have [name] biopsy today apparently they had [name] hard time getting it to stop bleeding that had [name] pressure dressing on pretty tightly the  good news is hemostasis has been achieved. With that being said the patient tells me that he unfortunately when they unwrapped him had several areas that were somewhat pussy and opened in regard to his leg I think he may have [name] bit of an infection ensuing again already. 08-22-2023 upon evaluation today patient's leg appears to be somewhat better in regards to the infection but unfortunately is still having some issues here with the wound the good news is Dr. Durene Cal report did come to me and the patient did have [name] staph infection but otherwise did not have any signs of  cancer pathology. 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 08-29-2023 upon evaluation today patient's leg is actually showing signs of significant improvement. Fortunately I see no evidence of worsening overall and I do believe that he is making good headway here towards closure. 09-19-2023 upon evaluation today patient's wound is actually showing signs of excellent improvement and seems to be making really good headway here towards closure. I am actually very pleased with where we stand. I do not see any signs of active infection at this time. Electronic Signature(s) Signed: 09/19/2023 10:52:43 AM By: Allen Derry PA-C Entered By: Allen Derry on 09/19/2023 10:52:43 -------------------------------------------------------------------------------- Physical Exam Details Patient Name: Date of Service: [name], [name] 09/19/2023 10:15 [name] M Medical Record Number: [id number] Patient Account Number: [id number] Date of Birth/Sex: Treating RN: August 26, 1946 (76 y.o. [name] Primary Care Provider: [name] Other Clinician: Referring Provider: Treating Provider/Extender: [name] in Treatment: 18 Constitutional Well-nourished and well-hydrated in no acute distress. Respiratory KOHAN, DENBO [name] ([id number]) [id number] Page 6 of 13 normal breathing without difficulty. Psychiatric this patient is able to make decisions and demonstrates good insight into disease process. Alert and Oriented x 3. pleasant and cooperative. Notes Upon inspection patient's wound bed actually showed signs of good granulation and epithelization at this point. Fortunately I do not see any signs of worsening I feel like he is making good headway I did perform some debridement clearway just some of the necrotic debris he tolerated that without complication postdebridement wound bed is significantly improved. I removed down to good  subcutaneous tissue. Electronic Signature(s) Signed: 09/19/2023 10:53:13 AM By: Allen Derry PA-C Entered By: Allen Derry on 09/19/2023 10:53:12 -------------------------------------------------------------------------------- Physician Orders Details Patient Name: Date of Service: [name], [name] 09/19/2023 10:15 [name] M Medical Record Number: [id number] Patient Account Number: [id number] Date of Birth/Sex: Treating RN: [birth year] (77 y.o. [name] Primary Care Provider: [name] Other Clinician: Referring Provider: Treating Provider/Extender: [name] in Treatment: 18 The following information was scribed by: [name] The information was scribed for: Allen Derry Verbal / Phone Orders: No Diagnosis Coding ICD-10 Coding Code Description I89.0 Lymphedema, not elsewhere classified 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 L97.822 Non-pressure chronic ulcer of other part of left lower leg with fat layer exposed I73.89 Other specified peripheral vascular diseases I48.0 Paroxysmal atrial fibrillation I10 Essential (primary) hypertension Follow-up Appointments Return Appointment in 1 week. Nurse Visit as needed Bathing/ Shower/ Hygiene May shower; gently cleanse wound with antibacterial soap, rinse and pat dry prior to dressing wounds Edema Control - Orders / Instructions UrgoK2 Elevate, Exercise Daily and [name] void Standing for Long Periods of Time. Elevate leg(s) parallel to the floor when sitting. Wound Treatment Wound #21R - Lower Leg Wound Laterality: Left, Lateral Cleanser: Soap and  Water 1 x Per Week/30 Days Discharge Instructions: Gently cleanse wound with antibacterial soap, rinse and pat dry prior to dressing wounds Cleanser: Wound Cleanser 1 x Per Week/30 Days Discharge Instructions: Wash your hands with soap and water. Remove old dressing, discard into plastic bag  and place into trash. Cleanse the wound with Wound Cleanser prior to applying [name] clean dressing using gauze sponges, not tissues or cotton balls. Do not scrub or use excessive force. Pat dry using gauze sponges, not tissue or cotton balls. Topical: Triamcinolone Acetonide Cream, 0.1%, 15 (g) tube 1 x Per Week/30 Days Discharge Instructions: Apply to entire lower leg [name], [name] ([id number]) [id number] Page 7 of 13 Prim Dressing: Hydrofera Blue Ready Transfer Foam, 2.5x2.5 (in/in) 1 x Per Week/30 Days ary Discharge Instructions: Apply Hydrofera Blue Ready to wound bed as directed Prim Dressing: Curad Oil Emulsion Dressing 3x3 (in/in) ary 1 x Per Week/30 Days Discharge Instructions: prior to HB Secondary Dressing: Zetuvit Plus 4x8 (in/in) 1 x Per Week/30 Days Compression Wrap: Urgo K2, two layer compression system, regular 1 x Per Week/30 Days Electronic Signature(s) Signed: 09/19/2023 4:38:12 PM By: Allen Derry PA-C Signed: 09/19/2023 5:08:46 PM By: [name] MSN RN CNS WTA Entered By: [name] on 09/19/2023 10:44:52 -------------------------------------------------------------------------------- Problem List Details Patient Name: Date of Service: [name] [name], [name] 09/19/2023 10:15 [name] M Medical Record Number: [id number] Patient Account Number: [id number] Date of Birth/Sex: Treating RN: 07-17-1946 (77 y.o. [name] Primary Care Provider: [name] Other Clinician: Referring Provider: Treating Provider/Extender: [name] in Treatment: 18 Active Problems ICD-10 Encounter Code Description Active Date MDM Diagnosis I89.0 Lymphedema, not elsewhere classified 05/16/2023 No Yes I87.333 Chronic venous hypertension (idiopathic) with ulcer and inflammation of 05/16/2023 No Yes bilateral lower extremity L97.822 Non-pressure chronic ulcer of other part of left lower leg with fat layer exposed8/05/2023 No Yes I73.89 Other  specified peripheral vascular diseases 05/16/2023 No Yes I48.0 Paroxysmal atrial fibrillation 05/16/2023 No Yes I10 Essential (primary) hypertension 05/16/2023 No Yes Inactive Problems Resolved Problems ZETH, SODERGREN ([id number]) [id number] Page 8 of 13 Electronic Signature(s) Signed: 09/19/2023 10:26:00 AM By: Allen Derry PA-C Entered By: Allen Derry on 09/19/2023 10:26:00 -------------------------------------------------------------------------------- Progress Note Details Patient Name: Date of Service: [name], [name] 09/19/2023 10:15 [name] M Medical Record Number: [id number] Patient Account Number: [id number] Date of Birth/Sex: Treating RN: 1946-02-28 (78 y.o. 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 the patient has had [name] vascular workup over 7 years ago and said everything was normal at that stage. He does not have any chronic problems except for cardiac issues which he sees [name] cardiologist in [place]. 08/15/2017 -- arterial and venous duplex studies still pending. 08/23/2017 -- venous reflux studies done on 08/13/2017 shows venous incompetence throughout the left lower extremity deep system and focally at the left saphenofemoral junction. 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 No evidence of SVT or DVT in  bilateral lower extremities The patient has an appointment at the end of the month  to get his arterial duplex study done 09/05/2017 -- the patient was seen at the vein and vascular office yesterday by Bary Castilla. ABI studies were notable for medial calcification and the toe brachial indices were normal and bilateral ankle-brachial) waveforms were normal with triphasic flow. After review of his venous studies he was not [name] candidate for laser ablation and his lymphedema was to be treated with compression stockings and lymphedema pump pumps 09/12/2017 -- had [name] low arterial study done at the [facility] -- unable to obtain reliable ABI is due to medial calcification. Bilateral toe indices were normal with the right being 1.01 and the left being 0.92 and the waveforms were triphasic bilaterally. he did get hold of 30-40 mm compression stockings but is unable to put these on. We will try and get him alternative compression stockings. 09/26/17- he is here in follow up evaluation of [name] right lower extremity ulcer;he is compliant in wearing compression stocking; ulcer almost epithelialized , anticipate healing next appointment Readmission: 11/17 point upon evaluation patient's wound currently that he is seeing [place] for today is [name] skin cancerous lesion that was cleared away by his dermatologist on the left medial calf region. He tells me that this is [name] very similar thing to what he had done previously in fact the last time he saw him in 2018 this was also what was going on at that point. Nonetheless he feels that based on what he seeing currently that this is just having [name] lot of harder time healing although it is much closer to the surface than what he is experienced in the past. He notes that the initial removal was in June 2022 which was this year this is now November and still has not closed. He does have some edema and definitely I think that there is some venous component to his  slow healing here. Also think that we can do something better than Vaseline to try to help with getting this to clear up as quickly as possible. He does have [name] history of atrial fibrillation and is on Eliquis otherwise he really has no major medical problems that would affect wound healing. 09/07/2021 upon evaluation today patient actually appears to be doing significantly better after having wrapped him last week. Overall I think that this is making significant improvements at this time which is great news. I do not see any evidence of infection which is great news as well. No fevers, chills, nausea, vomiting, or diarrhea. 09/14/2021 upon evaluation today patient appears to be doing well with regard to his leg ulcer. He has been tolerating the dressing changes and overall I think that he is making excellent progress. I do not see any signs of active infection at this time. 09/21/2021 upon evaluation today patient actually appears to be making good progress with regard to his wound this is again measuring smaller today no debridement seems to be necessary. We have been using [name] silver collagen dressing and I think that is doing an awesome job. 09/28/2021 upon evaluation today patient appears to be doing well with regard to his leg currently. I do not see any signs of active infection at this time which is great news. No fevers, chills, nausea, vomiting, or diarrhea. I think this wound is very close to complete resolution. 10/12/2021 upon evaluation today patient actually appears to be doing awesome in regard to his leg ulcer. In fact this appears to be completely healed based on what I am seeing currently.  I do not see any evidence of active infection locally nor systemically at this time which is also great news. No fevers, chills, ANIVAL, SALADO [name] ([id number]) [id number] Page 9 of 13 nausea, vomiting, or diarrhea. Readmission: 12/07/2021 upon evaluation today patient presents  for readmission here in the clinic. He was discharged on 10/12/2021 is completely healed. Unfortunately this has reopened at this point and he is having continual issues with new blisters over both lower extremities. This is even worse than what we previously saw. Nonetheless we did actually check his ABIs today and it did reveal that his ABIs were 0.55 on the left and 0.57 on the right. Subsequently this is [name] definite change from his last arterial study which showed that he did have good blood flow at 1.01 on the right and 0.92 on the left and that was right at the beginning of 2019. Nonetheless based on what we see currently I do think he tolerated the 3 layer compression wrap but I do believe that we probably need to get him tested for his arterial flow in order to see where things stand and if there is something we can do there that would help prevent this from continue to be an ongoing issue. He did not utilize compression socks in the interim from when he was last here till this time. That something is probably going to need lifelong going forward as well. 3/9; patient presents for follow-up. He has no issues or complaints today. He tolerated the compression wrap well. He had ABIs with TBI's done. He denies signs of infection. 12/21/2021 upon evaluation today patient appears to be doing well with regard to the wounds on his legs. Both are showing signs of significant improvement which is great news although I do believe some sharp debridement would be of benefit here as well. 12/28/2021 upon evaluation today patient appears to be doing well with regard to his wounds. Everything is showing signs of excellent improvement which I am very pleased about. I think that we are headed in the right direction here. Fortunately there does not appear to be any evidence of infection which is great news there is [name] little bit of hypergranulation. 01/04/2022 upon evaluation today patient appears to be doing well with  regard to his wounds 2 of them are healed 1 is almost so and the other 1 is significantly better. Overall I am extremely pleased with where we stand and I think that he is making excellent progress here. I do not see any evidence of active infection locally nor systemically at this time. 01-16-2022 upon evaluation today patient's wound on the left leg is showing signs of doing quite well. Has not completely cleared at this point but it is much improved. Fortunately I do not see any signs of infection at this time. No fevers, chills, nausea, vomiting, or diarrhea. 01-23-2022 upon evaluation today patient's wound of the left leg actually appears to be pretty much completely healed which is great news. I do not see any signs of active infection locally or systemically which is excellent. With that being said on the right leg what wound is measuring smaller the other 1 is [name] new wound that just showed up fortunately its not too bad. Has been using Xeroform here and that seems to be doing decently well which is great news. Unfortunately his blood pressure is significantly high we gave him the readings for the past 4-5 visits as well as [name] recommendation to make an appointment to go  discuss this with his primary care provider patient states that he is going to look into doing this. 01-30-2022 upon evaluation today patient appears to be doing well with regard to his left leg everything appears to be healed. On the right leg the more anterior wound is healed the more medial wound that I been concerned about [name] possible skin cancer unfortunately still does not look great to me. I do believe that we should probably do [name] biopsy I have talked about it with him [name] few times I think though it is probably time to go ahead and do this at this point. 02-09-2022 upon evaluation today patient appears to be doing well with regard to his legs. On the left this appears to be completely healed. On the right he does have 2 areas and  be perfectly honest one of them is [name] skin cancer that he is going to the Mohs surgery clinic for the other seems to be healing nicely. Readmission: 08-02-2022 upon evaluation today patient appears for reevaluation here in our clinic concerning issues that he has been having with wounds over the bilateral lower extremities. I last saw him in May 2023 and at that point we had him completely healed. Unfortunately he is tells me this has broken down to some degree since that point. 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 He tells me this 1 seems to just be getting bigger and bigger not improving. Fortunately he is not having any significant pain which is good news he does have quite [name] bit of swelling and he tells me that his fluid pills are not recommended for him to take daily but just in 3-day intervals here and there. 08-09-2022 upon evaluation today patient appears to be doing still somewhat poorly in regard to his legs although in general he does not appear to be feeling as good as he has been. Fortunately there does not appear to be any signs of infection which is good news. With that being said he is having some issues here with having and overall poor feeling in general which again is good I think going to be the biggest complicating factor. He actually seems to be coughing I do not hear any wheezing right now I did listen to his chest he did not have good airflow down low however makes me suspicious for bronchitis or even possibly pneumonia which could be part of what is going on here as well. Fortunately I do not see any evidence of anything worsening in regard to his legs but I definitely believe that he needs to continue with the compression wraps he took them off yesterday to shower has not  had anything on for 24 hours that is why his legs are so swollen today. With regard to his pathology report I did review that it showed some squamous abnormality but no signs of distinct carcinoma. With that being said it was saying that it could be adjacent to [name] squamous cell carcinoma nonetheless my suggestion is can be that we have the patient take copy of this report and give it to his Mohs surgeon in order for them to see if there is anything they feel like needs to be done further. With that being said right now I feel like the  primary thing is going to be for [place] to try to get his swelling down and keep that down into that hand since he is having so much drainage I believe we can have to bring him in for dressing changes twice [name] week doing [name] nurse visit on Mondays. 11/9; since the patient was last here he spent the night in the emergency room he received IV Lasix. Also received antibiotics although he was not discharged on either 1 of these. He also saw his cardiology office who put him on regular Lasix 20 mg [previously on as needed Lasix 20 mg]. Per our intake nurse the swelling in his legs is remarkably better but he still has bilateral lower extremity wounds. He still has wounds on the bilateral lower extremities most problematically on the left lateral calf. He has been using silver alginate under 3 layer compression. 08-23-2022 upon evaluation today patient appears to be doing much better than the last time I saw him 2 weeks ago. At that point I was very concerned about how he was doing he did see Dr. Sherrie Mustache his primary care provider they got him on some blood pressure medication in general his color and overall appearance looks to be doing much improved compared to the last time I saw him. 09-04-2022 upon evaluation today patient appears to be doing well currently in regard to his wounds. Everything is showing signs of improvement which is great news. Fortunately there does not appear to  be any signs of active infection locally or systemically at this time. No fevers, chills, nausea, vomiting, or diarrhea. 09-10-2022 upon evaluation today patient appears to be doing better in regard to his wounds although the [facility] was extremely stuck to the wound bed. Fortunately there does not appear to be any signs of infection locally or systemically at this time which is great news. No fevers, chills, nausea, vomiting, or diarrhea. 09-17-2022 upon evaluation today patient appears to be doing well currently in regard to his wounds in general. The right leg actually showing signs of excellent improvement and very pleased with where things stand in that regard. Fortunately I do not see any evidence of infection locally or systemically at this time which is great news. No fevers, chills, nausea, vomiting, or diarrhea. 09-24-2022 upon evaluation today patient appears to be doing well currently in regard to his wounds. Things look to be doing quite well. 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 He is seeing his dermatologist tomorrow in that regard. With that being said other than that however he seems to really be making some pretty good progress here which is good news. No fevers, chills, nausea, vomiting, or diarrhea. [name], [name] ([id number]) [id number] Page 10 of 13 12/26; the patient has 2 open wounds remaining on the left leg. One is on the left anterior mid tibia and the other is on the right lateral knee just outside of the popliteal fossa. 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 The patient has been to see dermatology Dr. Adolphus Birchwood who apparently is making him [name] referral to the [facility] Mohs surgery center. He does not yet have an appointment 10-12-2022 upon evaluation today patient  appears to be doing well currently in regard to his wound. He has been tolerating the dressing changes without complication and overall feel like we are  headed in the right direction. Fortunately I do not see any signs of infection locally or systemically at this time which is great news. No fevers, chills, nausea, vomiting, or diarrhea. 10-23-2022 upon evaluation today patient appears to be doing well currently in regard to his wound. He has been tolerating the dressing changes without complication. Fortunately there does not appear to be any signs of active infection locally nor systemically which is great news and overall I am extremely pleased with where we stand currently. No fevers, chills, nausea, vomiting, or diarrhea. 10-26-2022 upon evaluation today patient appears to be doing well currently in regard to his wounds. Everything is showing signs of improvement and this is great news. Fortunately I see no evidence of active infection systemically. He does seem to be doing much better in regard to the local infection in regard to his leg. The smell is also greatly improved. Overall I am extremely happy with where we stand today. This is after just [name] few days with the antibiotic on board. 11-05-2022 upon evaluation today patient appears to be doing well currently in regard to his wounds although the wound where they performed the Mohs surgery does look [name] little bit hyper granulated I think switching to [place] may be better for him. He voiced understanding. Fortunately there does not appear to be any evidence of active infection locally nor systemically at this time. 11-12-2022 upon evaluation today patient appears to be doing better in regard to both wounds he has been tolerating the dressing changes without complication. There is no signs of infection and in general I think you are doing quite well. No fevers, chills, nausea, vomiting, or diarrhea. 11-20-2022 upon evaluation today patient  appears to be doing well currently in regard to his wounds. He has been tolerating the dressing changes without complication. Fortunately there does not appear to be any signs of infection at this time. No fevers, chills, nausea, vomiting, or diarrhea. 11-27-2022 upon evaluation today patient appears to be doing somewhat poorly in regard to his leg in general he has [name] lot of areas where he looks like he had some spots that popped up. There with regard to new possible blisters. In general I am actually very concerned about the fact that the wrap may be causing some irritation here. I think that we can try to not do the wrap for 1 week, and given the prescription for mupirocin ointment which I will send into the pharmacy for him. 12-04-2022 upon evaluation today patient appears to be doing well currently in regard to his wounds in fact he appears to be pretty much completely healed based on what I am seeing at this point. I do not see any signs of active infection locally nor systemically at this time which is great news. No fevers, chills, nausea, vomiting, or diarrhea. 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 I discussed that with him today he is aware of the situation.-2024 upon evaluation patient presents for readmission here in the clinic actually last saw him December 04, 2022. With that being said previously his dermatologist had asked that if there was something the need to be addressed from [name] dermatology standpoint and specifically biopsy that we make referral back to them to allow them to do it which I am definitely happy to do. 01-31-2023 upon evaluation  today patient appears to be doing well currently in regard to his wound in fact this looks better he did see Dr. Adolphus Birchwood his dermatologist yesterday and they did perform [name] biopsy. With  that being said he actually looks like he is doing much better Dr. Adolphus Birchwood feels like this may not be [name] cancerous lesion which will be very good news at the same time I definitely wanted to make sure especially considering his history and the way this looks when we saw him last week. Nonetheless I am extremely pleased with the fact that he is doing so much better at this point. 02-07-2023 upon evaluation today patient appears to be doing well currently in regard to his wound from the standpoint of size it has not gotten any larger. With that being said he does have some issues here still with what appears to be potentially some infection. Wound does not look quite as good as it did last week. We are still waiting on the results for the pathology. I did put [name] call into dermatology but I have not heard anything back from them as of yet. 02-14-2023 upon evaluation today patient appears to be doing well with regard to his wound infection is definitely under control and looks much better. Fortunately I do not see any signs of systemic infection with anyone locally I think this is improved greatly. 02-19-2023 upon evaluation today patient appears to be doing well currently in regard to his wound which is actually measuring smaller and looking much better. Fortunately I do not see any evidence of active infection locally nor systemically which is great news and overall I am extremely pleased with where we stand today. 02-26-2023 upon evaluation today patient appears to be doing well currently in regard to his wound. He is actually been tolerating the dressing changes without complication. Fortunately there does not appear to be any signs of active infection locally nor systemically which is great news and overall I am extremely pleased with where we stand currently. 03-12-2023 upon evaluation today patient's wound actually showed signs of excellent improvement. I am very pleased with where we stand I do believe that  we are making good progress here. I do not see any signs of active infection. 03-19-2023 upon evaluation today patient actually appears to be making excellent progress in regard to his leg and getting this closed. In fact is just [name] very small area that is actually open at this point. I am actually very pleased with what we are seeing today. 03-26-2023 upon evaluation today patient appears to be doing well currently in regard to his leg which is actually showing signs of being completely healed. Fortunately I do not see any signs of active infection locally or systemically which is great news and in general I do believe that we are moving in the right direction here. Readmission: 05-16-2023 upon evaluation today patient presents for reevaluation here in the clinic concerning [name] wound on the left anterior/lateral lower extremity. This is similar to where the wound was last time I saw him but not exactly the same. Fortunately there does not appear to be any signs of active infection at this time which is great news. The patient's past medical history really has not changed significantly since last time I saw him. 05-24-2023 upon evaluation patient actually appears to be doing excellent in regard to his leg ulcer. He has been tolerating the dressing changes without complication in general I do feel like there were making excellent headway  towards complete closure. I do not see any signs of active infection at this point. 8/29; left lateral lower leg in the setting of chronic venous insufficiency. We have been using Hydrofera Blue under Urgo K2. Wounds are making nice improvements 06-20-2023 upon evaluation today patient appears to be doing well currently in regard to his leg ulcer. This is actually showing signs of being significantly smaller compared even last week's nurse visit this looks much better. Fortunately I do not see any need for sharp debridement he seems to be doing quite well. 06-27-2023 upon  evaluation today patient appears to be doing well currently in regard to his wound. He is tolerating the dressing changes without complication. There is [name] little bit of hypergranulation on the use of silver nitrate today to help keep this under control. Fortunately I do not see any evidence of active infection locally or systemically which is great news. [name], [name] ([id number]) [id number] Page 11 of 13 07-04-2023 upon evaluation today patient appears to be doing well currently in regard to his wound. He has been tolerating the dressing changes without complication. Fortunately I do not see any signs of active infection locally or systemically which is great news and in general I do believe that he is making really good headway towards complete closure. This looks much better after starting the antibiotics. 07-09-2023 upon evaluation today patient appears to be doing excellent in regard to his leg which is actually significantly improved. I am very pleased with where we stand and I do believe that he is making really good headway towards getting this closed in fact it pretty much appears to be almost closed today there is just [name] very tiny area that I think needs 1 week to toughen up before we cut him loose completely. 07-16-2023 upon evaluation today patient appears to be doing well currently in regard to his wounds. In fact everything is completely healed and looks to be doing great. Fortunately I do not see any signs of active infection at this time which is great news. No fevers, chills, nausea, vomiting, or diarrhea. 07-30-2023 upon evaluation today patient unfortunately has [name] breakdown of the wound on the lateral aspect of his leg. Unfortunately I am [name] little concerned about the overall appearance of the leg I feel like he may require [name] biopsy to further evaluate the situation. I discussed that with the patient today. With that being said I previously been asked by  Dr. Adolphus Birchwood not to perform any of the biopsies myself but to let them see it prior to the biopsy. For that reason I would go ahead and see about making referral to Dr. Durene Cal office to get this scheduled as quickly as possible is my hope. We reached out to them have not heard back as of yet. 08-08-2023 upon evaluation today patient's wound actually showed signs of doing really about the same with regard to his wound. There does not appear to be any signs of active infection at this time which is good news with that being said they did not biopsy this at the office today when he went to dermatology. This is unfortunate as I thought that we can do that now. I will get [name] try to see if we get in touch with Dr. Durene Cal office if were not able to get this done before I see him next week, going to just do the biopsy myself just to make sure we know what we are dealing with patient voiced understanding he  is in agreement with the plan. 08-15-2023 upon evaluation today patient appears to be doing pretty well currently in regard to his wound which is okay he did have [name] biopsy today apparently they had [name] hard time getting it to stop bleeding that had [name] pressure dressing on pretty tightly the good news is hemostasis has been achieved. With that being said the patient tells me that he unfortunately when they unwrapped him had several areas that were somewhat pussy and opened in regard to his leg I think he may have [name] bit of an infection ensuing again already. 08-22-2023 upon evaluation today patient's leg appears to be somewhat better in regards to the infection but unfortunately is still having some issues here with the wound the good news is Dr. Durene Cal report did come to me and the patient did have [name] staph infection but otherwise did not have any signs of cancer pathology. 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 08-29-2023 upon evaluation today patient's leg is actually showing  signs of significant improvement. Fortunately I see no evidence of worsening overall and I do believe that he is making good headway here towards closure. 09-19-2023 upon evaluation today patient's wound is actually showing signs of excellent improvement and seems to be making really good headway here towards closure. I am actually very pleased with where we stand. I do not see any signs of active infection at this time. Objective Constitutional Well-nourished and well-hydrated in no acute distress. Vitals Time Taken: 10:18 AM, Height: 75 in, Weight: 220 lbs, BMI: 27.5, Temperature: 98.2 F, Pulse: 73 bpm, Respiratory Rate: 18 breaths/min, Blood Pressure: 177/96 mmHg. Respiratory normal breathing without difficulty. Psychiatric this patient is able to make decisions and demonstrates good insight into disease process. Alert and Oriented x 3. pleasant and cooperative. General Notes: Upon inspection patient's wound bed actually showed signs of good granulation and epithelization at this point. Fortunately I do not see any signs of worsening I feel like he is making good headway I did perform some debridement clearway just some of the necrotic debris he tolerated that without complication postdebridement wound bed is significantly improved. I removed down to good subcutaneous tissue. Integumentary (Hair, Skin) Wound #21R status is Open. Original cause of wound was Gradually Appeared. The date acquired was: 04/08/2023. The wound has been in treatment 18 weeks. The wound is located on the Left,Lateral Lower Leg. The wound measures 3cm length x 2.3cm width x 0.1cm depth; 5.419cm^2 area and 0.542cm^3 volume. There is [name] none present amount of drainage noted. 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 There was [name] Excisional Skin/Subcutaneous Tissue Debridement with [name] total area of 0.54 sq cm performed by Allen Derry, PA-C. With the following instrument(s): Curette to remove Viable and Non-Viable tissue/material. Material removed includes Subcutaneous Tissue and Slough and after achieving pain control using Lidocaine 4% T opical Solution. No specimens were taken. [name] time out was conducted at 10:42, prior to the start of the procedure. [name] Minimum amount of bleeding was controlled with Pressure. The procedure was tolerated well with [name] pain level of 0  throughout and [name] pain level of 0 following the procedure. Post Debridement Measurements: 3cm length x 2.3cm width x 1cm depth; 5.419cm^3 volume. Character of Wound/Ulcer Post Debridement is stable. 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 There was [name] Four Layer Compression Therapy Procedure by [name], RN. Post procedure Diagnosis Wound #21R: Same as Pre-Procedure Plan Follow-up Appointments: Return Appointment in 1 week. Nurse Visit as needed Bathing/ Shower/ Hygiene: May shower; gently cleanse wound with antibacterial soap, rinse and pat dry prior to dressing wounds Edema Control - Orders / Instructions: UrgoK2 Elevate, Exercise Daily and Avoid Standing for Long Periods of Time. Elevate leg(s) parallel to the floor when sitting. WOUND #21R: - Lower Leg Wound Laterality: Left, Lateral Cleanser: Soap and Water 1 x Per Week/30 Days Discharge Instructions: Gently cleanse wound with antibacterial soap, rinse and  pat dry prior to dressing wounds Cleanser: Wound Cleanser 1 x Per Week/30 Days Discharge Instructions: Wash your hands with soap and water. Remove old dressing, discard into plastic bag and place into trash. Cleanse the wound with Wound Cleanser prior to applying [name] clean dressing using gauze sponges, not tissues or cotton balls. Do not scrub or use excessive force. Pat dry using gauze sponges, not tissue or cotton balls. Topical: Triamcinolone Acetonide Cream, 0.1%, 15 (g) tube 1 x Per Week/30 Days Discharge Instructions: Apply to entire lower leg Prim Dressing: Hydrofera Blue Ready Transfer Foam, 2.5x2.5 (in/in) 1 x Per Week/30 Days ary Discharge Instructions: Apply Hydrofera Blue Ready to wound bed as directed Prim Dressing: Curad Oil Emulsion Dressing 3x3 (in/in) 1 x Per Week/30 Days ary Discharge Instructions: prior to HB Secondary Dressing: Zetuvit Plus 4x8 (in/in) 1 x Per Week/30 Days Com pression Wrap: Urgo K2, two layer compression system, regular 1 x Per Week/30 Days 1. I would recommend based on what we are seeing that we have the patient going continue to monitor for any signs of infection or worsening. Based on what I am seeing I do believe that making good headway here towards closure. 2. I am also going to recommend the patient should continue to utilize the oil emulsion dressing as well as the [employer]	[place]. With 3 months of recommend to continue with the Zetuvit followed by the Urgo K2 compression wrap which is doing well. We will see patient back for reevaluation in 1 week here in the clinic. If anything worsens or changes patient will contact our office for additional recommendations. Electronic Signature(s) Signed: 09/19/2023 10:53:40 AM By: Allen Derry PA-C Entered By: Allen Derry on 09/19/2023 10:53:40 -------------------------------------------------------------------------------- SuperBill Details Patient Name: Date of Service: [name], [name]  09/19/2023 Medical Record Number: [id number] Patient Account Number: [id number] TAM, PIANA ([id number]) [id number] Page 13 of 13 Date of Birth/Sex: Treating RN: [birth year] (77 y.o. [name] Primary Care Provider: [name] Other Clinician: Referring Provider: Treating Provider/Extender: [name] in Treatment: 18 Diagnosis Coding ICD-10 Codes Code Description I89.0 Lymphedema, not elsewhere classified 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 L97.822 Non-pressure chronic ulcer of other part of left lower leg with fat layer exposed I73.89 Other specified peripheral vascular diseases I48.0 Paroxysmal atrial fibrillation I10 Essential (primary) hypertension Facility Procedures : CPT4 Code: [id number] Description: 11042 - DEB SUBQ TISSUE 20 SQ CM/< ICD-10 Diagnosis Description L97.822 Non-pressure chronic ulcer of other part of left lower leg with fat layer  expo Modifier: sed Quantity: 1 Physician Procedures : CPT4 Code Description Modifier 11042 11042 - WC PHYS SUBQ TISS 20 SQ CM ICD-10 Diagnosis Description L97.822 Non-pressure chronic ulcer of other part of left lower leg with fat layer exposed Quantity: 1 Electronic Signature(s) Signed: 09/19/2023 10:53:54 AM By: Allen Derry PA-C Entered By: Allen Derry on 09/19/2023 10:53:54

## 2023-09-20 ENCOUNTER — Other Ambulatory Visit: Payer: Self-pay | Admitting: Cardiology

## 2023-09-20 DIAGNOSIS — I872 Venous insufficiency (chronic) (peripheral): Secondary | ICD-10-CM

## 2023-09-20 NOTE — Progress Notes (Signed)
[name] [name] ([id number]) [id number] Page 1 of 9 Visit Report for 09/19/2023 Arrival Information Details Patient Name: Date of Service: [name] 09/19/2023 10:15 [name] M Medical Record Number: [id number] Patient Account Number: [id number] Date of Birth/Sex: Treating RN: 1946/01/11 (77 y.o. [name] Primary Care Avory Rahimi: [name] Other Clinician: Referring June Rode: Treating Soraiya Ahner/Extender: [name] in Treatment: 18 Visit Information History Since Last Visit Added or deleted any medications: No Patient Arrived: Ambulatory Any new allergies or adverse reactions: No Arrival Time: 10:15 Had [name] fall or experienced change in No Accompanied By: self activities of daily living that may affect Transfer Assistance: None risk of falls: Patient Identification Verified: Yes Hospitalized since last visit: No Secondary Verification Process Completed: Yes Has Dressing in Place as Prescribed: Yes Patient Requires Transmission-Based Precautions: No Has Compression in Place as Prescribed: Yes Patient Has Alerts: No Pain Present Now: No Electronic Signature(s) Signed: 09/19/2023 5:08:46 PM By: [name] MSN RN CNS WTA Entered By: [name] on 09/19/2023 10:18:55 -------------------------------------------------------------------------------- Clinic Level of Care Assessment Details Patient Name: Date of Service: [name] [name] 09/19/2023 10:15 [name] M Medical Record Number: [id number] Patient Account Number: [id number] Date of Birth/Sex: Treating RN: 18-Jan-1946 (77 y.o. [name] Primary Care Akanksha Bellmore: [name] Other Clinician: Referring Kaleiah Kutzer: Treating Cesare Sumlin/Extender: [name] in Treatment: 18 Clinic Level of Care Assessment Items TOOL 1 Quantity Score []  - 0 Use when EandM and Procedure is performed on INITIAL visit ASSESSMENTS - Nursing Assessment /  Reassessment []  - 0 General Physical Exam (combine w/ comprehensive assessment (listed just below) when performed on new pt. evals) []  - 0 Comprehensive Assessment (HX, ROS, Risk Assessments, Wounds Hx, etc.) ASSESSMENTS - Wound and Skin Assessment / Reassessment []  - 0 Dermatologic / Skin Assessment (not related to wound area) [name], [name] [id number] Page 2 of 9 ASSESSMENTS - Ostomy and/or Continence Assessment and Care []  - 0 Incontinence Assessment and Management []  - 0 Ostomy Care Assessment and Management (repouching, etc.) PROCESS - Coordination of Care []  - 0 Simple Patient / Family Education for ongoing care []  - 0 Complex (extensive) Patient / Family Education for ongoing care []  - 0 Staff obtains Chiropractor, Records, T Results / Process Orders est []  - 0 Staff telephones HHA, Nursing Homes / Clarify orders / etc []  - 0 Routine Transfer to another Facility (non-emergent condition) []  - 0 Routine Hospital Admission (non-emergent condition) []  - 0 New Admissions / Manufacturing engineer / Ordering NPWT Apligraf, etc. , []  - 0 Emergency Hospital Admission (emergent condition) PROCESS - Special Needs []  - 0 Pediatric / Minor Patient Management []  - 0 Isolation Patient Management []  - 0 Hearing / Language / Visual special needs []  - 0 Assessment of Community assistance (transportation, [name], etc.) []  - 0 Additional assistance / Altered mentation []  - 0 Support Surface(s) Assessment (bed, cushion, seat, etc.) INTERVENTIONS - Miscellaneous []  - 0 External ear exam []  - 0 Patient Transfer (multiple staff / Nurse, adult / Similar devices) []  - 0 Simple Staple / Suture removal (25 or less) []  - 0 Complex Staple / Suture removal (26 or more) []  - 0 Hypo/Hyperglycemic Management (do not check if billed separately) []  - 0 Ankle / Brachial Index (ABI) - do not check if billed separately Has the patient been seen at  the hospital within the last three years: Yes Total Score: 0 Level Of Care: ____ Electronic Signature(s) Signed:  09/19/2023 5:08:46 PM By: [name] MSN RN CNS WTA Entered By: [name] on 09/19/2023 10:44:59 -------------------------------------------------------------------------------- Compression Therapy Details Patient Name: Date of Service: [name] [name], [name] [name] [name] 09/19/2023 10:15 [name] M Medical Record Number: [id number] Patient Account Number: [id number] Date of Birth/Sex: Treating RN: [birth year] (77 y.o. [name] Primary Care Naveya Ellerman: [name] Other Clinician: Referring Clairissa Valvano: Treating Alexxus Sobh/Extender: [name] in Treatment: 18 Compression Therapy Performed for Wound Assessment: Wound #21R Left,Lateral Lower Leg Performed By: Clinician [name], RN Compression Type[address] JAHMEL, TINA [name] ([id number]) [id number] Page 3 of 9 Post Procedure Diagnosis Same as Pre-procedure Electronic Signature(s) Signed: 09/19/2023 5:08:46 PM By: [name] MSN RN CNS WTA Entered By: [name] on 09/19/2023 10:43:56 -------------------------------------------------------------------------------- Encounter Discharge Information Details Patient Name: Date of Service: [name] [name], [name] [name] [name] 09/19/2023 10:15 [name] M Medical Record Number: [id number] Patient Account Number: [id number] Date of Birth/Sex: Treating RN: Jun 15, 1946 (76 y.o. [name] Primary Care Marland Reine: [name] Other Clinician: Referring Rekia Kujala: Treating Makaelah Cranfield/Extender: [name] in Treatment: 18 Encounter Discharge Information Items Post Procedure Vitals Discharge Condition: Stable Temperature (F): 98.2 Ambulatory Status: Ambulatory Pulse (bpm): 73 Discharge Destination: Home Respiratory Rate (breaths/min): 18 Transportation: Private Auto Blood Pressure (mmHg): 177/96 Accompanied By: self Schedule Follow-up  Appointment: Yes Clinical Summary of Care: Electronic Signature(s) Signed: 09/19/2023 5:08:46 PM By: [name] MSN RN CNS WTA Entered By: [name] on 09/19/2023 10:46:29 -------------------------------------------------------------------------------- Lower Extremity Assessment Details Patient Name: Date of Service: [name] RGA [name], [name] [name] [name] 09/19/2023 10:15 [name] M Medical Record Number: [id number] Patient Account Number: [id number] Date of Birth/Sex: Treating RN: [birth year] (77 y.o. [name] Primary Care Lynn Recendiz: [name] Other Clinician: Referring Yeira Gulden: Treating Yazmeen Woolf/Extender: [name] in Treatment: 18 Edema Assessment Assessed: Kyra Searles: No] Franne Forts: No] [Left: Edema] Franne Forts: :] Lesia Hausen, Wasil [name] ([id number]) [id number] Page 4 of 9 Left: Right: Point of Measurement: 37 cm From Medial Instep 35 cm Ankle Left: Right: Point of Measurement: 12 cm From Medial Instep 23.3 cm Vascular Assessment Pulses: Dorsalis Pedis Palpable: [Left:Yes] Extremity colors, hair growth, and conditions: Extremity Color: [Left:Hyperpigmented] Hair Growth on Extremity: [Left:No] Temperature of Extremity: [Left:Warm] Capillary Refill: [Left:< 3 seconds] Dependent Rubor: [Left:No] Blanched when Elevated: [Left:No No] Toe Nail Assessment Left: Right: Thick: No Discolored: No Deformed: No Improper Length and Hygiene: Yes Electronic Signature(s) Signed: 09/19/2023 5:08:46 PM By: [name] MSN RN CNS WTA Entered By: [name] on 09/19/2023 10:32:35 -------------------------------------------------------------------------------- Multi Wound Chart Details Patient Name: Date of Service: [name] [name], [name] [name] [name] 09/19/2023 10:15 [name] M Medical Record Number: [id number] Patient Account Number: [id number] Date of Birth/Sex: Treating RN: 08-26-1946 (77 y.o. [name] Primary Care Rosene Pilling: [name] Other  Clinician: Referring Trammell Bowden: Treating Bralyn Folkert/Extender: [name] in Treatment: 18 Vital Signs Height(in): 75 Pulse(bpm): 73 Weight(lbs): 220 Blood Pressure(mmHg): 177/96 Body Mass Index(BMI): 27.5 Temperature(F): 98.2 Respiratory Rate(breaths/min): 18 [21R:Photos:] [[name]:[name]] HUSAYN, CAPPELLO ([id number]) [21R:Left, Lateral Lower Leg Wound Location: Gradually Appeared Wounding Event: Vasculitis Primary Etiology: Arrhythmia, Hypertension, Gout Comorbid History: 04/08/2023 Date Acquired: 18 Weeks of Treatment: Open Wound Status: No Wound Recurrence: 3x2.3x0.1  Measurements L x W x D (cm) 5.419 [name] (cm) : rea 0.542 Volume (cm) : 60.60% % Reduction in [name] rea: 60.60% % Reduction in Volume: Full Thickness Without Exposed Classification: Support Structures None Present Exudate Amount:] [[name]:[name] [name] [name] [name] [name] [name]  [name] [name] [name] [name] [name] [name]  [name] [name] [name]] Treatment Notes Electronic Signature(s) Signed: 09/19/2023 5:08:46 PM By: [name] MSN RN CNS WTA Entered By: [name] on 09/19/2023 10:41:33 -------------------------------------------------------------------------------- Multi-Disciplinary Care Plan Details Patient Name: Date of Service: [name] RGA [name], [name] [name] [name] 09/19/2023 10:15 [name] M Medical Record Number: [id number] Patient Account Number: [id number] Date of Birth/Sex: Treating RN: 02/17/1946 (77 y.o. [name] Primary Care Avira Tillison: [name] Other Clinician: Referring Mikeya Tomasetti: Treating Admir Candelas/Extender: [name] in Treatment: 18 Active Inactive Wound/Skin Impairment Nursing Diagnoses: Knowledge deficit related to ulceration/compromised skin integrity Goals: Patient/caregiver will verbalize understanding of skin care regimen Date Initiated: 05/16/2023 Target Resolution Date: 10/07/2023 Goal Status: Active Ulcer/skin breakdown will have [name] volume reduction of 30% by week 4 Date Initiated: 05/16/2023 Date Inactivated:  06/20/2023 Target Resolution Date: 06/16/2023 Goal Status: Met Ulcer/skin breakdown will have [name] volume reduction of 50% by week 8 Date Initiated: 05/16/2023 Date Inactivated: 07/17/2023 Target Resolution Date: 07/16/2023 Goal Status: Met Ulcer/skin breakdown will have [name] volume reduction of 80% by week 12 Date Initiated: 05/16/2023 Date Inactivated: 07/17/2023 Target Resolution Date: 08/16/2023 Goal Status: Met Ulcer/skin breakdown will heal within 14 weeks Date Initiated: 05/16/2023 Date Inactivated: 07/17/2023 Target Resolution Date: 09/15/2023 Goal Status: Met Interventions: Assess patient/caregiver ability to obtain necessary supplies Assess patient/caregiver ability to perform ulcer/skin care regimen upon admission and as needed Assess ulceration(s) every visit ERLON, GASBARRO ([id number]) [id number] Page 6 of 9 Notes: Electronic Signature(s) Signed: 09/19/2023 5:08:46 PM By: [name] MSN RN CNS WTA Entered By: [name] on 09/19/2023 10:45:15 -------------------------------------------------------------------------------- Pain Assessment Details Patient Name: Date of Service: [name] [name], [name] [name] [name] 09/19/2023 10:15 [name] M Medical Record Number: [id number] Patient Account Number: [id number] Date of Birth/Sex: Treating RN: [birth year] (77 y.o. [name] Primary Care Casper Pagliuca: [name] Other Clinician: Referring Pearline Yerby: Treating Kortny Lirette/Extender: [name] in Treatment: 18 Active Problems Location of Pain Severity and Description of Pain Patient Has Paino No Site Locations Pain Management and Medication Current Pain Management: Electronic Signature(s) Signed: 09/19/2023 5:08:46 PM By: [name] MSN RN CNS WTA Entered By: [name] on 09/19/2023 10:23:00 MAZON, SMEDLEY [name] [id number] Page 7 of  9 -------------------------------------------------------------------------------- Patient/Caregiver Education Details Patient Name: Date of Service: [name] 12/12/2024andnbsp10:15 [name] M Medical Record Number: [id number] Patient Account Number: [id number] Date of Birth/Gender: Treating RN: [birth year] (77 y.o. [name] Primary Care Physician: [name] Other Clinician: Referring Physician: Treating Physician/Extender: [name] in Treatment: 18 Education Assessment Education Provided To: Patient Education Topics Provided Wound/Skin Impairment: Handouts: Caring for Your Ulcer Methods: Explain/Verbal Responses: State content correctly Electronic Signature(s) Signed: 09/19/2023 5:08:46 PM By: [name] MSN RN CNS WTA Entered By: [name] on 09/19/2023 10:45:26 -------------------------------------------------------------------------------- Wound Assessment Details Patient Name: Date of Service: [name] [name], [name] [name] [name] 09/19/2023 10:15 [name] M Medical Record Number: [id number] Patient Account Number: [id number] Date of Birth/Sex: Treating RN: 10-31-1945 (77 y.o. [name] Primary Care Araeya Lamb: [name] Other Clinician: Referring Tanaja Ganger: Treating Ikran Patman/Extender: [name] in Treatment: 18 Wound Status Wound Number: 21R Primary Etiology: Vasculitis Wound Location: Left, Lateral Lower Leg Wound Status: Open Wounding Event: Gradually Appeared Comorbid History: Arrhythmia, Hypertension, Gout Date Acquired: 04/08/2023 Weeks Of Treatment: 18 Clustered Wound: No Photos Wound Measurements Length: (cm) 3 Climer, Romey [name] ([id number]) Width: (cm) Depth: (cm) Area: (cm) Volume: (cm) % Reduction in Area: 60.6% [id number] Page 8 of 9 2.3 % Reduction in Volume:  60.6% 0.1 5.419 0.542 Wound Description Classification: Full Thickness Without Exposed Supp Exudate Amount:  None Present ort Structures Treatment Notes Wound #21R (Lower Leg) Wound Laterality: Left, Lateral Cleanser Soap and Water Discharge Instruction: Gently cleanse wound with antibacterial soap, rinse and pat dry prior to dressing wounds Wound Cleanser Discharge Instruction: Wash your hands with soap and water. Remove old dressing, discard into plastic bag and place into trash. Cleanse the wound with Wound Cleanser prior to applying [name] clean dressing using gauze sponges, not tissues or cotton balls. Do not scrub or use excessive force. Pat dry using gauze sponges, not tissue or cotton balls. Peri-Wound Care Topical Triamcinolone Acetonide Cream, 0.1%, 15 ([name]) tube Discharge Instruction: Apply to entire lower leg Primary Dressing Hydrofera Blue Ready Transfer Foam, 2.5x2.5 (in/in) Discharge Instruction: Apply Hydrofera Blue Ready to wound bed as directed Curad Oil Emulsion Dressing 3x3 (in/in) Discharge Instruction: prior to HB Secondary Dressing Zetuvit Plus 4x8 (in/in) Secured With Compression Wrap Urgo K2, two layer compression system, regular Compression Stockings Add-Ons Electronic Signature(s) Signed: 09/19/2023 5:08:46 PM By: [name] MSN RN CNS WTA Entered By: [name] on 09/19/2023 10:30:14 -------------------------------------------------------------------------------- Vitals Details Patient Name: Date of Service: [name] [name], [name] [name] [name] 09/19/2023 10:15 [name] M Medical Record Number: [id number] Patient Account Number: [id number] Date of Birth/Sex: Treating RN: [birth year] (77 y.o. [name] Primary Care Jarett Dralle: [name] Other Clinician: Referring Prue Lingenfelter: Treating Allyn Bartelson/Extender: [name] in Treatment: 18 Vital Signs Time Taken: 10:18 Temperature (F): 98.2 Height (in): 75 Pulse (bpm): [address] [name] ([id number]) [id number] Page 9 of 9 Weight (lbs): 220 Respiratory Rate (breaths/min): 18 Body  Mass Index (BMI): 27.5 Blood Pressure (mmHg): 177/96 Reference Range: 80 - 120 mg / dl Electronic Signature(s) Signed: 09/19/2023 5:08:46 PM By: [name] MSN RN CNS WTA Entered By: [name] on 09/19/2023 10:22:50

## 2023-09-25 NOTE — Progress Notes (Signed)
Cardiology Clinic Note   Date: 09/27/2023 ID: [name], [name] 1945/10/17, MRN [id number]  Primary Cardiologist:  Bryan Lemma, MD  Patient Profile    [name] is a 77 y.o. male who presents to the clinic today for routine follow up.     Past medical history significant for: Chronic systolic heart failure/nonischemic cardiomyopathy. Echo 09/11/2022: EF 50 to 55%.  No RWMA.  Indeterminate diastolic parameters.  Mildly reduced RV function.  Moderate RVH.  Moderately elevated PA pressure.  Severe BAE.  Mild MR.  Borderline dilatation of ascending aorta 39 mm. Permanent A-fib. Onset June 2005. 3-day ZIO 02/15/2023: 100% A-fib 35 to 126 bpm, average 54 bpm.  1 run of NSVT 20 beats 80 to 124 bpm, average 106 bpm (11.6 seconds).  Occasional isolated PVCs and rare couplets.  Noted symptoms with heart rate 40 to 59 bpm and heart rate 110 to 126 bpm indicative of tachycardia-bradycardia with symptoms of both ends of the spectrum.  Low HR not in range to warrant pacemaker but consider EP referral. Carotid artery disease. Carotid duplex 09/14/2022: Bilateral ICA 1 to 39%.  Disturbed flow left subclavian. PAD. Lower extremity arterial ultrasound/ABI 09/14/2022: Right: Medial calcification throughout.  No evidence of stenosis in the CFA, DFA, SFA, popliteal artery and TPT.  One-vessel runoff via ATA.  Left: Medial calcifications throughout.  No evidence of stenosis in the CFA, DFA, SFA, popliteal artery and TPT.  Two-vessel runoff via ATA and peroneal artery.  ABIs indicate noncompressible bilateral lower extremity arteries.  Normal TBI bilaterally. Chronic venous insufficiency. Hypertension. Hyperlipidemia. Lipid panel 12/13/2022: LDL 66, HDL 51, TG 60, total 130. Hypothyroidism.  In summary, patient  was first diagnosed with atrial fibrillation in June 2005 when he presented to the ED with rapid A-fib and nonischemic cardiomyopathy.  LHC showed no evidence of CAD.  EF estimated at 30% with  global hypokinesis.  He underwent TEE/DCCV and did well until taking decongestant medications causing him to go back into A-fib.  He was initially anticoagulated with Coumadin.  Repeat echo November 2009 showed recovered LV function with EF 55%, moderate BAE.  He was switched from Coumadin to Xarelto per his request in March 2017.  Referred to Dr. Kirke Corin secondary to chronic lower extremity wounds treated by wound center and abnormal ABIs in March 2023.  He underwent repeat lower extremity arterial ultrasound/ABIs in December 2023 as above.  Dr. Kirke Corin felt there was no indication for revascularization at the time of his visit in December.  He felt chronic wounds secondary to chronic venous insufficiency and not arterial insufficiency.     History of Present Illness    JEROLD [name] is followed by Dr. Herbie Baltimore for the above outlined history.  Patient was last seen in the office by Dr. Herbie Baltimore on 02/04/2023 for routine follow-up.  Concern for chronotropic competence or bradycardia.  Carvedilol was decreased and valsartan was increased.  Patient wore a 3-day ZIO which showed symptoms with both tachycardia and bradycardia.  Today, patient is doing well. Patient denies shortness of breath, lower extremity edema, orthopnea or PND. He reports dyspnea with heavier exertion but none with routine activities. No chest pain, pressure, or tightness. No palpitations. He feels greatly improved since carvedilol was decreased. He continues to be seen by the wound center for left lower extremity wound. He reports his activity is somewhat limited secondary to the wound. He does continue to walk for exercise.      ROS: All other systems reviewed and  are otherwise negative except as noted in History of Present Illness.  Studies Reviewed    EKG Interpretation Date/Time:  Friday September 27 2023 09:11:53 EST Ventricular Rate:  75 PR Interval:    QRS Duration:  126 QT Interval:  478 QTC Calculation: 533 R  Axis:   -38  Text Interpretation: Atrial fibrillation Left axis deviation Non-specific intra-ventricular conduction block Nonspecific T wave abnormality When compared with ECG of 02/04/2023 (not in [facility]) no significant change Confirmed by Carlos Levering [phone]) on 09/27/2023 9:27:39 AM   Risk Assessment/Calculations     CHA2DS2-VASc Score = 4   This indicates a 4.8% annual risk of stroke. The patient's score is based upon: CHF History: 1 HTN History: 1 Diabetes History: 0 Stroke History: 0 Vascular Disease History: 0 Age Score: 2 Gender Score: 0    HYPERTENSION CONTROL Vitals:   09/27/23 0904 09/27/23 1004  BP: (!) 160/71 (!) 146/70    The patient's blood pressure is elevated above target today.  In order to address the patient's elevated BP: Blood pressure will be monitored at home to determine if medication changes need to be made. (Patient did not take medications today)           Physical Exam    VS:  BP (!) 146/70 (BP Location: Left Arm, Patient Position: Sitting, Cuff Size: Normal)   Pulse 75   Ht 6\' 3"  (1.905 m)   Wt 232 lb 3.2 oz (105.3 kg)   SpO2 94%   BMI 29.02 kg/m  , BMI Body mass index is 29.02 kg/m.  GEN: Well nourished, well developed, in no acute distress. Neck: No JVD or carotid bruits. Cardiac: Irregular rhythm, regular rate. No murmurs. No rubs or gallops.   Respiratory:  Respirations regular and unlabored. Clear to auscultation without rales, wheezing or rhonchi. GI: Soft, nontender, nondistended. Extremities: Radials 2+ and equal bilaterally. 1+ PT/DP and equal bilaterally. No clubbing or cyanosis. Mild nonpitting edema bilateral lower extremities. Left lower extremity with dressing in place.  Chronic venous stasis color changes bilateral lower extremities.  Skin: Warm and dry, no rash. Neuro: Strength intact.  Assessment & Plan   Chronic systolic heart failure/nonischemic cardiomyopathy Estimated LVEF 30% during LHC.  Repeat echo  November 2009 showed recovered LV function with EF 55%.  Last echo December 2023 showed EF 50 to 55%.  Patient reports dyspnea with heavier exertion but none with routine activities. He has chronic lower extremity edema. No orthopnea or PND. Mild lower extremity edema bilaterally.  Euvolemic and well compensated on exam. -Continue carvedilol, chlorthalidone, Lasix, valsartan.  Permanent A-fib Onset June 2005.  Patient denies palpitations. He feels much improved since decreasing carvedilol. He does not feel his heart rate is getting too low since. He denies lightheadedness, dizziness, presyncope or syncope. Denies spontaneous bleeding concerns. HR 75 bpm today. He declines EP referral at this time.  -Continue carvedilol, Xarelto.  PAD/chronic venous insufficiency Lower extremity arterial ultrasound/ABI showed evidence of arterial disease with normal toe pressure with no indication for revascularization per Dr. Kirke Corin.  Patient with history of chronic lower extremity wounds secondary to chronic venous insufficiency. He continues to be followed by wound care for chronic wound. Left lower extremity is wrapped today.  -Continue rosuvastatin. -Continue to follow with wound care.   Hypertension BP today 160/71 on intake and 146/70 on my recheck. Patient has not taken medications today.  -Continue chlorthalidone, carvedilol, valsartan.  Hyperlipidemia LDL March 2024 66, at goal.  -Continue rosuvastatin.  Disposition: Return in  1 year or sooner as needed.          Signed, Etta Grandchild. Amil Moseman, DNP, NP-C

## 2023-09-26 ENCOUNTER — Encounter: Payer: Medicare PPO | Admitting: Physician Assistant

## 2023-09-26 DIAGNOSIS — L97822 Non-pressure chronic ulcer of other part of left lower leg with fat layer exposed: Secondary | ICD-10-CM | POA: Diagnosis not present

## 2023-09-26 DIAGNOSIS — L958 Other vasculitis limited to the skin: Secondary | ICD-10-CM | POA: Diagnosis not present

## 2023-09-26 DIAGNOSIS — I872 Venous insufficiency (chronic) (peripheral): Secondary | ICD-10-CM | POA: Diagnosis not present

## 2023-09-26 DIAGNOSIS — I480 Paroxysmal atrial fibrillation: Secondary | ICD-10-CM | POA: Diagnosis not present

## 2023-09-26 DIAGNOSIS — I87333 Chronic venous hypertension (idiopathic) with ulcer and inflammation of bilateral lower extremity: Secondary | ICD-10-CM | POA: Diagnosis not present

## 2023-09-26 DIAGNOSIS — I739 Peripheral vascular disease, unspecified: Secondary | ICD-10-CM | POA: Diagnosis not present

## 2023-09-26 DIAGNOSIS — I10 Essential (primary) hypertension: Secondary | ICD-10-CM | POA: Diagnosis not present

## 2023-09-26 DIAGNOSIS — I482 Chronic atrial fibrillation, unspecified: Secondary | ICD-10-CM | POA: Diagnosis not present

## 2023-09-26 DIAGNOSIS — I890 Lymphedema, not elsewhere classified: Secondary | ICD-10-CM | POA: Diagnosis not present

## 2023-09-26 DIAGNOSIS — M109 Gout, unspecified: Secondary | ICD-10-CM | POA: Diagnosis not present

## 2023-09-27 ENCOUNTER — Ambulatory Visit: Payer: Medicare PPO | Attending: Student | Admitting: Student

## 2023-09-27 ENCOUNTER — Encounter: Payer: Self-pay | Admitting: Student

## 2023-09-27 VITALS — BP 146/70 | HR 75 | Ht 75.0 in | Wt 232.2 lb

## 2023-09-27 DIAGNOSIS — I5022 Chronic systolic (congestive) heart failure: Secondary | ICD-10-CM | POA: Diagnosis not present

## 2023-09-27 DIAGNOSIS — E785 Hyperlipidemia, unspecified: Secondary | ICD-10-CM | POA: Diagnosis not present

## 2023-09-27 DIAGNOSIS — I4821 Permanent atrial fibrillation: Secondary | ICD-10-CM | POA: Diagnosis not present

## 2023-09-27 DIAGNOSIS — I428 Other cardiomyopathies: Secondary | ICD-10-CM | POA: Diagnosis not present

## 2023-09-27 DIAGNOSIS — I872 Venous insufficiency (chronic) (peripheral): Secondary | ICD-10-CM | POA: Diagnosis not present

## 2023-09-27 DIAGNOSIS — I10 Essential (primary) hypertension: Secondary | ICD-10-CM | POA: Diagnosis not present

## 2023-09-27 NOTE — Progress Notes (Signed)
GERROD, BILLUPS [name] ([id number]) [id number] Page 1 of 13 Visit Report for 09/26/2023 Chief Complaint Document Details Patient Name: Date of Service: [name] 09/26/2023 10:15 [name] Medical Record Number: [id number] Patient Account Number: [id number] Date of Birth/Sex: Treating RN: 01-Jun-1946 (77 y.o. [name] Primary Care Provider: [name] Other Clinician: Referring Provider: Treating Provider/Extender: [name] in Treatment: 19 Information Obtained from: Patient Chief Complaint Left LE Ulcer Electronic Signature(s) Signed: 09/26/2023 10:56:17 AM By: Allen Derry PA-C Entered By: Allen Derry on 09/26/2023 07:56:17 -------------------------------------------------------------------------------- Debridement Details Patient Name: Date of Service: [name], [name] 09/26/2023 10:15 [name] Medical Record Number: [id number] Patient Account Number: [id number] Date of Birth/Sex: Treating RN: 12-15-1945 (77 y.o. [name] Primary Care Provider: [name] Other Clinician: Referring Provider: Treating Provider/Extender: [name] in Treatment: 19 Debridement Performed for Assessment: Wound #21R Left,Lateral Lower Leg Performed By: Physician Allen Derry, PA-C The following information was scribed by: [name] The information was scribed for: Allen Derry Debridement Type: Debridement Level of Consciousness (Pre-procedure): Awake and Alert Pre-procedure Verification/Time Out Yes - 11:01 Taken: Start Time: 11:01 Pain Control: Lidocaine 4% T opical Solution Percent of Wound Bed Debrided: 100% T Area Debrided (cm): otal 3.34 Tissue and other material debrided: Viable, Non-Viable, Subcutaneous, Hyper-granulation Level: Skin/Subcutaneous Tissue Debridement Description: Excisional Instrument: Curette Bleeding: Large Hemostasis Achieved: Silver Nitrate JUDEA, [name] ([id number])  [id number] Page 2 of 13 Procedural Pain: 1 Post Procedural Pain: 1 Response to Treatment: Procedure was tolerated well Level of Consciousness (Post- Awake and Alert procedure): Post Debridement Measurements of Total Wound Length: (cm) 2.5 Width: (cm) 1.7 Depth: (cm) 0.3 Volume: (cm) 1.001 Character of Wound/Ulcer Post Debridement: Stable Post Procedure Diagnosis Same as Pre-procedure Electronic Signature(s) Unsigned Entered By: [name] on 09/26/2023 08:02:43 -------------------------------------------------------------------------------- HPI Details Patient Name: Date of Service: [name] [name] 09/26/2023 10:15 [name] Medical Record Number: [id number] Patient Account Number: [id number] Date of Birth/Sex: Treating RN: [birth year] (77 y.o. 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 the patient has had [name] vascular workup over 7 years ago and said everything was normal at that stage. He does not have any chronic problems except for cardiac issues which he sees [name] cardiologist in [place]. 08/15/2017 -- arterial and venous duplex studies still pending. 08/23/2017 -- venous reflux studies done on 08/13/2017 shows venous incompetence throughout the left lower extremity deep system and focally at the left saphenofemoral junction. 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 No evidence  of SVT or DVT in bilateral lower extremities The patient has an appointment at the end of the month to get his arterial duplex study done 09/05/2017 -- the patient was seen at the vein and vascular office yesterday by Bary Castilla. ABI studies were notable for medial calcification and the toe brachial indices were normal and bilateral ankle-brachial) waveforms were normal with triphasic flow. After review of his venous studies he was not [name] candidate for laser ablation and his lymphedema was to be treated with compression stockings  and lymphedema pump pumps 09/12/2017 -- had [name] low arterial study done at the [facility] -- unable to obtain reliable ABI is due to medial calcification. Bilateral toe indices were normal with the right being 1.01 and the left being 0.92 and the waveforms were triphasic bilaterally. he did get hold of 30-40 mm compression stockings but is unable to put these on. We will try and get him alternative compression stockings. 09/26/17- he is here in follow up evaluation of [name] right lower extremity ulcer;he is compliant in wearing compression stocking; ulcer almost epithelialized , anticipate healing next appointment Readmission: 11/17 point upon evaluation patient's wound currently that he is seeing [place] for today is [name] skin cancerous lesion that was cleared away by his dermatologist on the left medial calf region. He tells me that this is [name] very similar thing to what he had done previously in fact the last time he saw him in 2018 this was also what was going on at that point. Nonetheless he feels that based on what he seeing currently that this is just having [name] lot of harder time healing although it is much closer to the surface than what he is experienced in the past. He notes that the initial removal was in June 2022 which was this year this is now November and still has not closed. He does have some edema and definitely I think that there is some venous  component to his slow healing here. Also think that we can do something better than Vaseline to try to help with getting this to clear up as quickly as possible. He does have [name] history of atrial fibrillation and is on Eliquis otherwise he really has no major medical problems that would affect wound healing. KAMAREON, OAKLEY [name] ([id number]) [id number] Page 3 of 13 09/07/2021 upon evaluation today patient actually appears to be doing significantly better after having wrapped him last week. Overall I think that this is making significant improvements at this time which is great news. I do not see any evidence of infection which is great news as well. No fevers, chills, nausea, vomiting, or diarrhea. 09/14/2021 upon evaluation today patient appears to be doing well with regard to his leg ulcer. He has been tolerating the dressing changes and overall I think that he is making excellent progress. I do not see any signs of active infection at this time. 09/21/2021 upon evaluation today patient actually appears to be making good progress with regard to his wound this is again measuring smaller today no debridement seems to be necessary. We have been using [name] silver collagen dressing and I think that is doing an awesome job. 09/28/2021 upon evaluation today patient appears to be doing well with regard to his leg currently. I do not see any signs of active infection at this time which is great news. No fevers, chills, nausea, vomiting, or diarrhea. I think this wound is very close to complete resolution. 10/12/2021 upon evaluation today patient actually appears to be doing awesome in regard to his leg ulcer. In fact this appears to be completely healed based on what I am seeing currently. I do not see any evidence of active infection locally nor systemically at this time which is also great news. No fevers, chills, nausea, vomiting, or diarrhea. Readmission: 12/07/2021 upon evaluation today  patient presents for readmission here in the clinic. He was discharged on 10/12/2021 is completely healed. Unfortunately this has reopened at this point and he is having continual  issues with new blisters over both lower extremities. This is even worse than what we previously saw. Nonetheless we did actually check his ABIs today and it did reveal that his ABIs were 0.55 on the left and 0.57 on the right. Subsequently this is [name] definite change from his last arterial study which showed that he did have good blood flow at 1.01 on the right and 0.92 on the left and that was right at the beginning of 2019. Nonetheless based on what we see currently I do think he tolerated the 3 layer compression wrap but I do believe that we probably need to get him tested for his arterial flow in order to see where things stand and if there is something we can do there that would help prevent this from continue to be an ongoing issue. He did not utilize compression socks in the interim from when he was last here till this time. That something is probably going to need lifelong going forward as well. 3/9; patient presents for follow-up. He has no issues or complaints today. He tolerated the compression wrap well. He had ABIs with TBI's done. He denies signs of infection. 12/21/2021 upon evaluation today patient appears to be doing well with regard to the wounds on his legs. Both are showing signs of significant improvement which is great news although I do believe some sharp debridement would be of benefit here as well. 12/28/2021 upon evaluation today patient appears to be doing well with regard to his wounds. Everything is showing signs of excellent improvement which I am very pleased about. I think that we are headed in the right direction here. Fortunately there does not appear to be any evidence of infection which is great news there is [name] little bit of hypergranulation. 01/04/2022 upon evaluation today patient appears to  be doing well with regard to his wounds 2 of them are healed 1 is almost so and the other 1 is significantly better. Overall I am extremely pleased with where we stand and I think that he is making excellent progress here. I do not see any evidence of active infection locally nor systemically at this time. 01-16-2022 upon evaluation today patient's wound on the left leg is showing signs of doing quite well. Has not completely cleared at this point but it is much improved. Fortunately I do not see any signs of infection at this time. No fevers, chills, nausea, vomiting, or diarrhea. 01-23-2022 upon evaluation today patient's wound of the left leg actually appears to be pretty much completely healed which is great news. I do not see any signs of active infection locally or systemically which is excellent. With that being said on the right leg what wound is measuring smaller the other 1 is [name] new wound that just showed up fortunately its not too bad. Has been using Xeroform here and that seems to be doing decently well which is great news. Unfortunately his blood pressure is significantly high we gave him the readings for the past 4-5 visits as well as [name] recommendation to make an appointment to go discuss this with his primary care provider patient states that he is going to look into doing this. 01-30-2022 upon evaluation today patient appears to be doing well with regard to his left leg everything appears to be healed. On the right leg the more anterior wound is healed the more medial wound that I been concerned about [name] possible skin cancer unfortunately still does not look great to me.  I do believe that we should probably do [name] biopsy I have talked about it with him [name] few times I think though it is probably time to go ahead and do this at this point. 02-09-2022 upon evaluation today patient appears to be doing well with regard to his legs. On the left this appears to be completely healed. On the right he  does have 2 areas and be perfectly honest one of them is [name] skin cancer that he is going to the Mohs surgery clinic for the other seems to be healing nicely. Readmission: 08-02-2022 upon evaluation today patient appears for reevaluation here in our clinic concerning issues that he has been having with wounds over the bilateral lower extremities. I last saw him in May 2023 and at that point we had him completely healed. Unfortunately he is tells me this has broken down to some degree since that point. 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 He tells me this 1 seems to just be getting bigger and bigger not improving. Fortunately he is not having any significant pain which is good news he does have quite [name] bit of swelling and he tells me that his fluid pills are not recommended for him to take daily but just in 3-day intervals here and there. 08-09-2022 upon evaluation today patient appears to be doing still somewhat poorly in regard to his legs although in general he does not appear to be feeling as good as he has been. Fortunately there does not appear to be any signs of infection which is good news. With that being said he is having some issues here with having and overall poor feeling in general which again is good I think going to be the biggest complicating factor. He actually seems to be coughing I do not hear any wheezing right now I did listen to his chest he did not have good airflow down low however makes me suspicious for bronchitis or even possibly pneumonia which could be part of what is going on here as well. Fortunately I do not see any evidence of anything worsening in regard to his legs but I definitely believe that he needs to continue with the compression wraps he took them off  yesterday to shower has not had anything on for 24 hours that is why his legs are so swollen today. With regard to his pathology report I did review that it showed some squamous abnormality but no signs of distinct carcinoma. With that being said it was saying that it could be adjacent to [name] squamous cell carcinoma nonetheless my suggestion is can be that we have the patient take copy of this report and give it to his Mohs surgeon in order for them to see if there is anything they feel like needs to be done further. With that being said right now I feel like the primary thing is going to be for [place] to try to get his swelling down and keep that down into that hand since he is having so much drainage I believe we can have to bring him in for dressing changes twice [name] week doing [name] nurse visit on Mondays. 11/9; since the patient was last here he spent the night in the emergency room he received IV Lasix. Also  received antibiotics although he was not discharged on either 1 of these. He also saw his cardiology office who put him on regular Lasix 20 mg [previously on as needed Lasix 20 mg]. Per our intake nurse the swelling in his legs is remarkably better but he still has bilateral lower extremity wounds. He still has wounds on the bilateral lower extremities most problematically on the left lateral calf. He has been using silver alginate under 3 layer compression. 08-23-2022 upon evaluation today patient appears to be doing much better than the last time I saw him 2 weeks ago. At that point I was very concerned about how he was doing he did see Dr. Sherrie Mustache his primary care provider they got him on some blood pressure medication in general his color and overall appearance looks to be doing much improved compared to the last time I saw him. THONG, RASPA [name] ([id number]) [id number] Page 4 of 13 09-04-2022 upon evaluation today patient appears to be doing well currently in regard to his  wounds. Everything is showing signs of improvement which is great news. Fortunately there does not appear to be any signs of active infection locally or systemically at this time. No fevers, chills, nausea, vomiting, or diarrhea. 09-10-2022 upon evaluation today patient appears to be doing better in regard to his wounds although the [facility] was extremely stuck to the wound bed. Fortunately there does not appear to be any signs of infection locally or systemically at this time which is great news. No fevers, chills, nausea, vomiting, or diarrhea. 09-17-2022 upon evaluation today patient appears to be doing well currently in regard to his wounds in general. The right leg actually showing signs of excellent improvement and very pleased with where things stand in that regard. Fortunately I do not see any evidence of infection locally or systemically at this time which is great news. No fevers, chills, nausea, vomiting, or diarrhea. 09-24-2022 upon evaluation today patient appears to be doing well currently in regard to his wounds. Things look to be doing quite well. 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 He is seeing his dermatologist tomorrow in that regard. With that being said other than that however he seems to really be making some pretty good progress here which is good news. No fevers, chills, nausea, vomiting, or diarrhea. 12/26; the patient has 2 open wounds remaining on the left leg. One is on the left anterior mid tibia and the other is on the right lateral knee just outside of the popliteal fossa. 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 The patient has been to see dermatology Dr. Adolphus Birchwood who apparently is making him [name] referral to the [facility] Mohs surgery center. He does not yet have an appointment 10-12-2022 upon  evaluation today patient appears to be doing well currently in regard to his wound. He has been tolerating the dressing changes without complication and overall feel like we are headed in the right direction. Fortunately I do not see any signs of infection locally or systemically at this time which is great news. No fevers, chills, nausea, vomiting, or diarrhea. 10-23-2022 upon evaluation today patient appears to be doing well currently in regard to his wound. He has been tolerating the dressing changes without complication. Fortunately there does not appear to be any signs of active infection locally nor  systemically which is great news and overall I am extremely pleased with where we stand currently. No fevers, chills, nausea, vomiting, or diarrhea. 10-26-2022 upon evaluation today patient appears to be doing well currently in regard to his wounds. Everything is showing signs of improvement and this is great news. Fortunately I see no evidence of active infection systemically. He does seem to be doing much better in regard to the local infection in regard to his leg. The smell is also greatly improved. Overall I am extremely happy with where we stand today. This is after just [name] few days with the antibiotic on board. 11-05-2022 upon evaluation today patient appears to be doing well currently in regard to his wounds although the wound where they performed the Mohs surgery does look [name] little bit hyper granulated I think switching to [facility] may be better for him. He voiced understanding. Fortunately there does not appear to be any evidence of active infection locally nor systemically at this time. 11-12-2022 upon evaluation today patient appears to be doing better in regard to both wounds he has been tolerating the dressing changes without complication. There is no signs of infection and in general I think you are doing quite well. No fevers, chills, nausea, vomiting, or diarrhea. 11-20-2022 upon  evaluation today patient appears to be doing well currently in regard to his wounds. He has been tolerating the dressing changes without complication. Fortunately there does not appear to be any signs of infection at this time. No fevers, chills, nausea, vomiting, or diarrhea. 11-27-2022 upon evaluation today patient appears to be doing somewhat poorly in regard to his leg in general he has [name] lot of areas where he looks like he had some spots that popped up. There with regard to new possible blisters. In general I am actually very concerned about the fact that the wrap may be causing some irritation here. I think that we can try to not do the wrap for 1 week, and given the prescription for mupirocin ointment which I will send into the pharmacy for him. 12-04-2022 upon evaluation today patient appears to be doing well currently in regard to his wounds in fact he appears to be pretty much completely healed based on what I am seeing at this point. I do not see any signs of active infection locally nor systemically at this time which is great news. No fevers, chills, nausea, vomiting, or diarrhea. 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 I discussed that with him today he is aware of the situation.-2024 upon evaluation patient presents for readmission here in the clinic actually last saw him December 04, 2022. With that being said previously his dermatologist had asked that if there was something the need to be addressed from [name] dermatology standpoint and specifically biopsy that we make referral back to them to allow them to do it which I am definitely happy to do. 01-31-2023 upon evaluation today patient appears to be doing well currently in regard to his wound in fact this looks better he did see Dr. Adolphus Birchwood his dermatologist yesterday and they did  perform [name] biopsy. With that being said he actually looks like he is doing much better Dr. Adolphus Birchwood feels like this may not be [name] cancerous lesion which will be very good news at the same time I definitely wanted  to make sure especially considering his history and the way this looks when we saw him last week. Nonetheless I am extremely pleased with the fact that he is doing so much better at this point. 02-07-2023 upon evaluation today patient appears to be doing well currently in regard to his wound from the standpoint of size it has not gotten any larger. With that being said he does have some issues here still with what appears to be potentially some infection. Wound does not look quite as good as it did last week. We are still waiting on the results for the pathology. I did put [name] call into dermatology but I have not heard anything back from them as of yet. 02-14-2023 upon evaluation today patient appears to be doing well with regard to his wound infection is definitely under control and looks much better. Fortunately I do not see any signs of systemic infection with anyone locally I think this is improved greatly. 02-19-2023 upon evaluation today patient appears to be doing well currently in regard to his wound which is actually measuring smaller and looking much better. Fortunately I do not see any evidence of active infection locally nor systemically which is great news and overall I am extremely pleased with where we stand today. 02-26-2023 upon evaluation today patient appears to be doing well currently in regard to his wound. He is actually been tolerating the dressing changes without complication. Fortunately there does not appear to be any signs of active infection locally nor systemically which is great news and overall I am extremely pleased with where we stand currently. 03-12-2023 upon evaluation today patient's wound actually showed signs of excellent improvement. I am very pleased with where we  stand I do believe that we are making good progress here. I do not see any signs of active infection. 03-19-2023 upon evaluation today patient actually appears to be making excellent progress in regard to his leg and getting this closed. In fact is just [name] very small area that is actually open at this point. I am actually very pleased with what we are seeing today. 03-26-2023 upon evaluation today patient appears to be doing well currently in regard to his leg which is actually showing signs of being completely healed. Fortunately I do not see any signs of active infection locally or systemically which is great news and in general I do believe that we are moving in the right direction here. Readmission: 05-16-2023 upon evaluation today patient presents for reevaluation here in the clinic concerning [name] wound on the left anterior/lateral lower extremity. This is similar EGHOSA, PHETTEPLACE [name] ([id number]) [id number] Page 5 of 13 to where the wound was last time I saw him but not exactly the same. Fortunately there does not appear to be any signs of active infection at this time which is great news. The patient's past medical history really has not changed significantly since last time I saw him. 05-24-2023 upon evaluation patient actually appears to be doing excellent in regard to his leg ulcer. He has been tolerating the dressing changes without complication in general I do feel like there were making excellent headway towards complete closure. I do not see any signs of active infection at this point. 8/29; left lateral lower leg in the setting of chronic venous insufficiency. We have been using Hydrofera Blue under Urgo K2. Wounds are making nice improvements 06-20-2023 upon evaluation today patient appears to be doing well currently in regard to his leg ulcer. This is actually  showing signs of being significantly smaller compared even last week's nurse visit this looks much better.  Fortunately I do not see any need for sharp debridement he seems to be doing quite well. 06-27-2023 upon evaluation today patient appears to be doing well currently in regard to his wound. He is tolerating the dressing changes without complication. There is [name] little bit of hypergranulation on the use of silver nitrate today to help keep this under control. Fortunately I do not see any evidence of active infection locally or systemically which is great news. 07-04-2023 upon evaluation today patient appears to be doing well currently in regard to his wound. He has been tolerating the dressing changes without complication. Fortunately I do not see any signs of active infection locally or systemically which is great news and in general I do believe that he is making really good headway towards complete closure. This looks much better after starting the antibiotics. 07-09-2023 upon evaluation today patient appears to be doing excellent in regard to his leg which is actually significantly improved. I am very pleased with where we stand and I do believe that he is making really good headway towards getting this closed in fact it pretty much appears to be almost closed today there is just [name] very tiny area that I think needs 1 week to toughen up before we cut him loose completely. 07-16-2023 upon evaluation today patient appears to be doing well currently in regard to his wounds. In fact everything is completely healed and looks to be doing great. Fortunately I do not see any signs of active infection at this time which is great news. No fevers, chills, nausea, vomiting, or diarrhea. 07-30-2023 upon evaluation today patient unfortunately has [name] breakdown of the wound on the lateral aspect of his leg. Unfortunately I am [name] little concerned about the overall appearance of the leg I feel like he may require [name] biopsy to further evaluate the situation. I discussed that with the patient today. With that being said I  previously been asked by Dr. Adolphus Birchwood not to perform any of the biopsies myself but to let them see it prior to the biopsy. For that reason I would go ahead and see about making referral to Dr. Durene Cal office to get this scheduled as quickly as possible is my hope. We reached out to them have not heard back as of yet. 08-08-2023 upon evaluation today patient's wound actually showed signs of doing really about the same with regard to his wound. There does not appear to be any signs of active infection at this time which is good news with that being said they did not biopsy this at the office today when he went to dermatology. This is unfortunate as I thought that we can do that now. I will get [name] try to see if we get in touch with Dr. Durene Cal office if were not able to get this done before I see him next week, going to just do the biopsy myself just to make sure we know what we are dealing with patient voiced understanding he is in agreement with the plan. 08-15-2023 upon evaluation today patient appears to be doing pretty well currently in regard to his wound which is okay he did have [name] biopsy today apparently they had [name] hard time getting it to stop bleeding that had [name] pressure dressing on pretty tightly the good news is hemostasis has been achieved. With that being said the patient tells me that he unfortunately  when they unwrapped him had several areas that were somewhat pussy and opened in regard to his leg I think he may have [name] bit of an infection ensuing again already. 08-22-2023 upon evaluation today patient's leg appears to be somewhat better in regards to the infection but unfortunately is still having some issues here with the wound the good news is Dr. Durene Cal report did come to me and the patient did have [name] staph infection but otherwise did not have any signs of cancer pathology. 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 08-29-2023 upon evaluation today patient's  leg is actually showing signs of significant improvement. Fortunately I see no evidence of worsening overall and I do believe that he is making good headway here towards closure. 09-19-2023 upon evaluation today patient's wound is actually showing signs of excellent improvement and seems to be making really good headway here towards closure. I am actually very pleased with where we stand. I do not see any signs of active infection at this time. 09-26-2023 upon evaluation today patient appears to be still having issues with this lateral leg ulcer he has 2 new places that are showing up as well on the lateral leg on the left. 1 [name] little bit higher up 1 just below. With that being said unfortunately this has continued to be an ongoing issue for him he is obviously very frustrated today which I completely understand. Electronic Signature(s) Signed: 09/26/2023 1:50:50 PM By: Allen Derry PA-C Entered By: Allen Derry on 09/26/2023 10:50:50 -------------------------------------------------------------------------------- Physical Exam Details Patient Name: Date of Service: [name], [name] 09/26/2023 10:15 [name] Medical Record Number: [id number] Patient Account Number: [id number] Date of Birth/Sex: Treating RN: [birth year] (77 y.o. [name] Primary Care Provider: [name] Other Clinician: Referring Provider: Treating Provider/Extender: [name] in Treatment: [address] [place] [name] [id number] Page 6 of 13 Constitutional Well-nourished and well-hydrated in no acute distress. Respiratory normal breathing without difficulty. Psychiatric this patient is able to make decisions and demonstrates good insight into disease process. Alert and Oriented x 3. pleasant and cooperative. Notes Upon inspection patient's wound bed actually showed signs of good granulation and the 2 smaller new wounds and this actually appears to be doing  quite well. Unfortunately in regard to the wound that has been present he has [name] lot of hypergranulation I think we can have to use the Hydrofera Blue directly to the wound bed to prevent this from continuing to build up and he voiced understanding. I did perform [name] fairly aggressive debridement to remove the hypergranulation postdebridement this looks much better. Electronic Signature(s) Signed: 09/26/2023 1:51:21 PM By: Allen Derry PA-C Entered By: Allen Derry on 09/26/2023 10:51:21 -------------------------------------------------------------------------------- Physician Orders Details Patient Name: Date of Service: [name], [name] 09/26/2023 10:15 [name] Medical Record Number: [id number] Patient Account Number: [id number] Date of Birth/Sex: Treating RN: Jul 13, 1946 (77 y.o. [name] Primary Care Provider: [name] Other Clinician: Referring Provider: Treating Provider/Extender: [name] in Treatment: 19 The following information was scribed by: [name] The information was scribed for: Allen Derry Verbal / Phone Orders: No Diagnosis Coding ICD-10 Coding Code Description I89.0 Lymphedema, not elsewhere classified 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 L97.822 Non-pressure chronic ulcer of other part of left lower leg with fat layer exposed I73.89 Other specified peripheral vascular diseases I48.0 Paroxysmal atrial fibrillation I10 Essential (primary) hypertension  Follow-up Appointments Return Appointment in 1 week. Nurse Visit as needed Bathing/ Shower/ Hygiene May shower; gently cleanse wound with antibacterial soap, rinse and pat dry prior to dressing wounds Edema Control - Orders / Instructions UrgoK2 Elevate, Exercise Daily and [name] void Standing for Long Periods of Time. Elevate leg(s) parallel to the floor when sitting. Wound Treatment Wound #21R - Lower Leg  Wound Laterality: Left, Lateral Cleanser: Soap and Water 1 x Per Week/30 Days Discharge Instructions: Gently cleanse wound with antibacterial soap, rinse and pat dry prior to dressing wounds Cleanser: Wound Cleanser 1 x Per Week/30 Days Discharge Instructions: Wash your hands with soap and water. Remove old dressing, discard into plastic bag and place into trash. Cleanse the ARTURO, GOETZE ([id number]) [id number] Page 7 of 13 wound with Wound Cleanser prior to applying [name] clean dressing using gauze sponges, not tissues or cotton balls. Do not scrub or use excessive force. Pat dry using gauze sponges, not tissue or cotton balls. Peri-Wound Care: Desitin Maximum Strength Ointment 4 (oz) 1 x Per Week/30 Days Discharge Instructions: Apply around wound edges to avoid maceration Topical: Triamcinolone Acetonide Cream, 0.1%, 15 (g) tube 1 x Per Week/30 Days Discharge Instructions: Apply to entire lower leg Topical: 1 x Per Week/30 Days Prim Dressing: Hydrofera Blue Ready Transfer Foam, 2.5x2.5 (in/in) 1 x Per Week/30 Days ary Discharge Instructions: Apply Hydrofera Blue Ready to wound bed as directed Secondary Dressing: Zetuvit Plus 4x8 (in/in) 1 x Per Week/30 Days Compression Wrap: Urgo K2, two layer compression system, regular 1 x Per Week/30 Days Electronic Signature(s) Unsigned Entered By: [name] on 09/26/2023 08:06:07 -------------------------------------------------------------------------------- Problem List Details Patient Name: Date of Service: [name] [name] 09/26/2023 10:15 [name] Medical Record Number: [id number] Patient Account Number: [id number] Date of Birth/Sex: Treating RN: July 01, 1946 (77 y.o. [name] Primary Care Provider: [name] Other Clinician: Referring Provider: Treating Provider/Extender: [name] in Treatment: 33 Active Problems ICD-10 Encounter Code Description Active Date  MDM Diagnosis I89.0 Lymphedema, not elsewhere classified 05/16/2023 No Yes I87.333 Chronic venous hypertension (idiopathic) with ulcer and inflammation of 05/16/2023 No Yes bilateral lower extremity L97.822 Non-pressure chronic ulcer of other part of left lower leg with fat layer exposed8/05/2023 No Yes I73.89 Other specified peripheral vascular diseases 05/16/2023 No Yes I48.0 Paroxysmal atrial fibrillation 05/16/2023 No Yes I10 Essential (primary) hypertension 05/16/2023 No Yes NAOKI, HUCKEBA [name] ([id number]) [id number] Page 8 of 13 Inactive Problems Resolved Problems Electronic Signature(s) Unsigned Previous Signature: 09/26/2023 10:56:14 AM Version By: Allen Derry PA-C Entered By: [name] on 09/26/2023 08:07:21 -------------------------------------------------------------------------------- Progress Note Details Patient Name: Date of Service: [name], [name] 09/26/2023 10:15 [name] Medical Record Number: [id number] Patient Account Number: [id number] Date of Birth/Sex: Treating RN: Jun 26, 1946 (77 y.o. 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 the patient has had [name] vascular workup over 7 years ago and said everything was normal at that stage. He does not have any chronic problems except for cardiac issues which he sees [name] cardiologist in  [place]. 08/15/2017 -- arterial and venous duplex studies still pending. 08/23/2017 -- venous reflux studies done on 08/13/2017 shows venous incompetence throughout the left lower extremity deep system and focally at the left saphenofemoral junction. 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 No evidence of SVT or DVT in bilateral lower extremities The patient has an appointment at the end of the month to get his arterial duplex study done 09/05/2017 -- the patient was seen at the vein and vascular office yesterday by Bary Castilla. ABI studies were notable for medial calcification and the toe brachial indices were normal and bilateral ankle-brachial) waveforms were normal with triphasic flow. After review of his venous studies he was not [name] candidate for laser ablation and his lymphedema was to be treated with compression stockings and lymphedema pump pumps 09/12/2017 -- had [name] low arterial study done at the [facility] -- unable to obtain reliable ABI is due to medial calcification. Bilateral toe indices were normal with the right being 1.01 and the left being 0.92 and the waveforms were triphasic bilaterally. he did get hold of 30-40 mm compression stockings but is unable to put these on. We will try and get him alternative compression stockings. 09/26/17- he is here in follow up evaluation of [name] right lower extremity ulcer;he is compliant in wearing compression stocking; ulcer almost epithelialized , anticipate healing next appointment Readmission: 11/17 point upon evaluation patient's wound currently that he is seeing [place] for today is [name] skin cancerous lesion that was cleared away by his dermatologist on the left medial calf region. He tells me that this is [name] very similar thing to what he had done previously in fact the last time he saw him in 2018 this was also what was going on at that point. Nonetheless he feels that based on what he seeing currently  that this is just having [name] lot of harder time healing although it is much closer to the surface than what he is experienced in the past. He notes that the initial removal was in June 2022 which was this year this is now November and still has not closed. He does have some edema and definitely I think that there is some venous component to his slow healing here. Also think that we can do something better than Vaseline to try to help with getting this to clear up as quickly as possible. He does have [name] history of atrial fibrillation and is on Eliquis otherwise he really has no major medical problems that would affect wound healing. 09/07/2021 upon evaluation today patient actually appears to be doing significantly better after having wrapped him last week. Overall I think that this is making significant improvements at this time which is great news. I do not see any evidence of infection which is great news as well. No fevers, chills, nausea, vomiting, or diarrhea. DAVONTA, POSTHUMA [name] ([id number]) [id number] Page 9 of 13 09/14/2021 upon evaluation today patient appears to be doing well with regard to his leg ulcer. He has been tolerating the dressing changes and overall I think that he is making excellent progress. I do not see any signs of active infection at this time. 09/21/2021 upon evaluation today patient actually appears to be making good progress with regard to his wound this is again measuring smaller today no debridement seems to be necessary. We have been using [name] silver collagen dressing and I think that is doing an awesome job. 09/28/2021 upon evaluation today patient appears  to be doing well with regard to his leg currently. I do not see any signs of active infection at this time which is great news. No fevers, chills, nausea, vomiting, or diarrhea. I think this wound is very close to complete resolution. 10/12/2021 upon evaluation today patient actually appears to be  doing awesome in regard to his leg ulcer. In fact this appears to be completely healed based on what I am seeing currently. I do not see any evidence of active infection locally nor systemically at this time which is also great news. No fevers, chills, nausea, vomiting, or diarrhea. Readmission: 12/07/2021 upon evaluation today patient presents for readmission here in the clinic. He was discharged on 10/12/2021 is completely healed. Unfortunately this has reopened at this point and he is having continual issues with new blisters over both lower extremities. This is even worse than what we previously saw. Nonetheless we did actually check his ABIs today and it did reveal that his ABIs were 0.55 on the left and 0.57 on the right. Subsequently this is [name] definite change from his last arterial study which showed that he did have good blood flow at 1.01 on the right and 0.92 on the left and that was right at the beginning of 2019. Nonetheless based on what we see currently I do think he tolerated the 3 layer compression wrap but I do believe that we probably need to get him tested for his arterial flow in order to see where things stand and if there is something we can do there that would help prevent this from continue to be an ongoing issue. He did not utilize compression socks in the interim from when he was last here till this time. That something is probably going to need lifelong going forward as well. 3/9; patient presents for follow-up. He has no issues or complaints today. He tolerated the compression wrap well. He had ABIs with TBI's done. He denies signs of infection. 12/21/2021 upon evaluation today patient appears to be doing well with regard to the wounds on his legs. Both are showing signs of significant improvement which is great news although I do believe some sharp debridement would be of benefit here as well. 12/28/2021 upon evaluation today patient appears to be doing well with regard to his  wounds. Everything is showing signs of excellent improvement which I am very pleased about. I think that we are headed in the right direction here. Fortunately there does not appear to be any evidence of infection which is great news there is [name] little bit of hypergranulation. 01/04/2022 upon evaluation today patient appears to be doing well with regard to his wounds 2 of them are healed 1 is almost so and the other 1 is significantly better. Overall I am extremely pleased with where we stand and I think that he is making excellent progress here. I do not see any evidence of active infection locally nor systemically at this time. 01-16-2022 upon evaluation today patient's wound on the left leg is showing signs of doing quite well. Has not completely cleared at this point but it is much improved. Fortunately I do not see any signs of infection at this time. No fevers, chills, nausea, vomiting, or diarrhea. 01-23-2022 upon evaluation today patient's wound of the left leg actually appears to be pretty much completely healed which is great news. I do not see any signs of active infection locally or systemically which is excellent. With that being said on the right leg  what wound is measuring smaller the other 1 is [name] new wound that just showed up fortunately its not too bad. Has been using Xeroform here and that seems to be doing decently well which is great news. Unfortunately his blood pressure is significantly high we gave him the readings for the past 4-5 visits as well as [name] recommendation to make an appointment to go discuss this with his primary care provider patient states that he is going to look into doing this. 01-30-2022 upon evaluation today patient appears to be doing well with regard to his left leg everything appears to be healed. On the right leg the more anterior wound is healed the more medial wound that I been concerned about [name] possible skin cancer unfortunately still does not look great to  me. I do believe that we should probably do [name] biopsy I have talked about it with him [name] few times I think though it is probably time to go ahead and do this at this point. 02-09-2022 upon evaluation today patient appears to be doing well with regard to his legs. On the left this appears to be completely healed. On the right he does have 2 areas and be perfectly honest one of them is [name] skin cancer that he is going to the Mohs surgery clinic for the other seems to be healing nicely. Readmission: 08-02-2022 upon evaluation today patient appears for reevaluation here in our clinic concerning issues that he has been having with wounds over the bilateral lower extremities. I last saw him in May 2023 and at that point we had him completely healed. Unfortunately he is tells me this has broken down to some degree since that point. 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 He tells me this 1 seems to just be getting bigger and bigger not improving. Fortunately he is not having any significant pain which is good news he does have quite [name] bit of swelling and he tells me that his fluid pills are not recommended for him to take daily but just in 3-day intervals here and there. 08-09-2022 upon evaluation today patient appears to be doing still somewhat poorly in regard to his legs although in general he does not appear to be feeling as good as he has been. Fortunately there does not appear to be any signs of infection which is good news. With that being said he is having some issues here with having and overall poor feeling in general which again is good I think going to be the biggest complicating factor. He actually seems to be coughing I do not hear any wheezing right now I did listen to his chest he did not have good  airflow down low however makes me suspicious for bronchitis or even possibly pneumonia which could be part of what is going on here as well. Fortunately I do not see any evidence of anything worsening in regard to his legs but I definitely believe that he needs to continue with the compression wraps he took them off yesterday to shower has not had anything on for 24 hours that is why his legs are so swollen today. With regard to his pathology report I did review that it showed some squamous abnormality but no signs of distinct carcinoma. With that  being said it was saying that it could be adjacent to [name] squamous cell carcinoma nonetheless my suggestion is can be that we have the patient take copy of this report and give it to his Mohs surgeon in order for them to see if there is anything they feel like needs to be done further. With that being said right now I feel like the primary thing is going to be for [place] to try to get his swelling down and keep that down into that hand since he is having so much drainage I believe we can have to bring him in for dressing changes twice [name] week doing [name] nurse visit on Mondays. 11/9; since the patient was last here he spent the night in the emergency room he received IV Lasix. Also received antibiotics although he was not discharged on either 1 of these. He also saw his cardiology office who put him on regular Lasix 20 mg [previously on as needed Lasix 20 mg]. Per our intake nurse the swelling in his legs is remarkably better but he still has bilateral lower extremity wounds. He still has wounds on the bilateral lower extremities most problematically on the left lateral calf. He has been using silver alginate under 3 layer compression. 08-23-2022 upon evaluation today patient appears to be doing much better than the last time I saw him 2 weeks ago. At that point I was very concerned about how he was doing he did see Dr. Sherrie Mustache his primary care provider they got him on  some blood pressure medication in general his color and overall appearance looks to be doing much improved compared to the last time I saw him. 09-04-2022 upon evaluation today patient appears to be doing well currently in regard to his wounds. Everything is showing signs of improvement which is great news. Fortunately there does not appear to be any signs of active infection locally or systemically at this time. No fevers, chills, nausea, vomiting, or diarrhea. 09-10-2022 upon evaluation today patient appears to be doing better in regard to his wounds although the [facility] was extremely stuck to the wound bed. CASMIER, TARDIF [name] ([id number]) [id number] Page 10 of 13 Fortunately there does not appear to be any signs of infection locally or systemically at this time which is great news. No fevers, chills, nausea, vomiting, or diarrhea. 09-17-2022 upon evaluation today patient appears to be doing well currently in regard to his wounds in general. The right leg actually showing signs of excellent improvement and very pleased with where things stand in that regard. Fortunately I do not see any evidence of infection locally or systemically at this time which is great news. No fevers, chills, nausea, vomiting, or diarrhea. 09-24-2022 upon evaluation today patient appears to be doing well currently in regard to his wounds. Things look to be doing quite well. 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 He is seeing his dermatologist tomorrow in that regard. With that being said other than that however he seems to really be making some pretty good progress here which is good news. No fevers, chills, nausea, vomiting, or diarrhea. 12/26; the patient has 2 open wounds remaining on the left leg. One is on the left anterior mid tibia and the other is on  the right lateral knee just outside of the popliteal fossa. The latter wound apparently has been  biopsied showing squamous cell carcinoma. The patient has been to see dermatology Dr. Adolphus Birchwood who apparently is making him [name] referral to the [facility] surgery center. He does not yet have an appointment 10-12-2022 upon evaluation today patient appears to be doing well currently in regard to his wound. He has been tolerating the dressing changes without complication and overall feel like we are headed in the right direction. Fortunately I do not see any signs of infection locally or systemically at this time which is great news. No fevers, chills, nausea, vomiting, or diarrhea. 10-23-2022 upon evaluation today patient appears to be doing well currently in regard to his wound. He has been tolerating the dressing changes without complication. Fortunately there does not appear to be any signs of active infection locally nor systemically which is great news and overall I am extremely pleased with where we stand currently. No fevers, chills, nausea, vomiting, or diarrhea. 10-26-2022 upon evaluation today patient appears to be doing well currently in regard to his wounds. Everything is showing signs of improvement and this is great news. Fortunately I see no evidence of active infection systemically. He does seem to be doing much better in regard to the local infection in regard to his leg. The smell is also greatly improved. Overall I am extremely happy with where we stand today. This is after just [name] few days with the antibiotic on board. 11-05-2022 upon evaluation today patient appears to be doing well currently in regard to his wounds although the wound where they performed the Mohs surgery does look [name] little bit hyper granulated I think switching to [facility] may be better for him. He voiced understanding. Fortunately there does not appear to be any evidence of active infection locally nor systemically  at this time. 11-12-2022 upon evaluation today patient appears to be doing better in regard to both wounds he has been tolerating the dressing changes without complication. There is no signs of infection and in general I think you are doing quite well. No fevers, chills, nausea, vomiting, or diarrhea. 11-20-2022 upon evaluation today patient appears to be doing well currently in regard to his wounds. He has been tolerating the dressing changes without complication. Fortunately there does not appear to be any signs of infection at this time. No fevers, chills, nausea, vomiting, or diarrhea. 11-27-2022 upon evaluation today patient appears to be doing somewhat poorly in regard to his leg in general he has [name] lot of areas where he looks like he had some spots that popped up. There with regard to new possible blisters. In general I am actually very concerned about the fact that the wrap may be causing some irritation here. I think that we can try to not do the wrap for 1 week, and given the prescription for mupirocin ointment which I will send into the pharmacy for him. 12-04-2022 upon evaluation today patient appears to be doing well currently in regard to his wounds in fact he appears to be pretty much completely healed based on what I am seeing at this point. I do not see any signs of active infection locally nor systemically at this time which is great news. No fevers, chills, nausea, vomiting, or diarrhea. 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 I discussed that with him today he is aware of the situation.-2024  upon evaluation patient presents for readmission here in the clinic actually last saw him December 04, 2022. With that being said previously his dermatologist had asked that if there was something the need to be addressed from [name] dermatology  standpoint and specifically biopsy that we make referral back to them to allow them to do it which I am definitely happy to do. 01-31-2023 upon evaluation today patient appears to be doing well currently in regard to his wound in fact this looks better he did see Dr. Adolphus Birchwood his dermatologist yesterday and they did perform [name] biopsy. With that being said he actually looks like he is doing much better Dr. Adolphus Birchwood feels like this may not be [name] cancerous lesion which will be very good news at the same time I definitely wanted to make sure especially considering his history and the way this looks when we saw him last week. Nonetheless I am extremely pleased with the fact that he is doing so much better at this point. 02-07-2023 upon evaluation today patient appears to be doing well currently in regard to his wound from the standpoint of size it has not gotten any larger. With that being said he does have some issues here still with what appears to be potentially some infection. Wound does not look quite as good as it did last week. We are still waiting on the results for the pathology. I did put [name] call into dermatology but I have not heard anything back from them as of yet. 02-14-2023 upon evaluation today patient appears to be doing well with regard to his wound infection is definitely under control and looks much better. Fortunately I do not see any signs of systemic infection with anyone locally I think this is improved greatly. 02-19-2023 upon evaluation today patient appears to be doing well currently in regard to his wound which is actually measuring smaller and looking much better. Fortunately I do not see any evidence of active infection locally nor systemically which is great news and overall I am extremely pleased with where we stand today. 02-26-2023 upon evaluation today patient appears to be doing well currently in regard to his wound. He is actually been tolerating the dressing changes  without complication. Fortunately there does not appear to be any signs of active infection locally nor systemically which is great news and overall I am extremely pleased with where we stand currently. 03-12-2023 upon evaluation today patient's wound actually showed signs of excellent improvement. I am very pleased with where we stand I do believe that we are making good progress here. I do not see any signs of active infection. 03-19-2023 upon evaluation today patient actually appears to be making excellent progress in regard to his leg and getting this closed. In fact is just [name] very small area that is actually open at this point. I am actually very pleased with what we are seeing today. 03-26-2023 upon evaluation today patient appears to be doing well currently in regard to his leg which is actually showing signs of being completely healed. Fortunately I do not see any signs of active infection locally or systemically which is great news and in general I do believe that we are moving in the right direction here. Readmission: 05-16-2023 upon evaluation today patient presents for reevaluation here in the clinic concerning [name] wound on the left anterior/lateral lower extremity. This is similar to where the wound was last time I saw him but not exactly the same. Fortunately there does not appear  to be any signs of active infection at this time which is great news. The patient's past medical history really has not changed significantly since last time I saw him. 05-24-2023 upon evaluation patient actually appears to be doing excellent in regard to his leg ulcer. He has been tolerating the dressing changes without complication in general I do feel like there were making excellent headway towards complete closure. I do not see any signs of active infection at this point. ALCUS, SCIARRETTA [name] ([id number]) [id number] Page 11 of 13 8/29; left lateral lower leg in the setting of chronic  venous insufficiency. We have been using Hydrofera Blue under Urgo K2. Wounds are making nice improvements 06-20-2023 upon evaluation today patient appears to be doing well currently in regard to his leg ulcer. This is actually showing signs of being significantly smaller compared even last week's nurse visit this looks much better. Fortunately I do not see any need for sharp debridement he seems to be doing quite well. 06-27-2023 upon evaluation today patient appears to be doing well currently in regard to his wound. He is tolerating the dressing changes without complication. There is [name] little bit of hypergranulation on the use of silver nitrate today to help keep this under control. Fortunately I do not see any evidence of active infection locally or systemically which is great news. 07-04-2023 upon evaluation today patient appears to be doing well currently in regard to his wound. He has been tolerating the dressing changes without complication. Fortunately I do not see any signs of active infection locally or systemically which is great news and in general I do believe that he is making really good headway towards complete closure. This looks much better after starting the antibiotics. 07-09-2023 upon evaluation today patient appears to be doing excellent in regard to his leg which is actually significantly improved. I am very pleased with where we stand and I do believe that he is making really good headway towards getting this closed in fact it pretty much appears to be almost closed today there is just [name] very tiny area that I think needs 1 week to toughen up before we cut him loose completely. 07-16-2023 upon evaluation today patient appears to be doing well currently in regard to his wounds. In fact everything is completely healed and looks to be doing great. Fortunately I do not see any signs of active infection at this time which is great news. No fevers, chills, nausea, vomiting, or  diarrhea. 07-30-2023 upon evaluation today patient unfortunately has [name] breakdown of the wound on the lateral aspect of his leg. Unfortunately I am [name] little concerned about the overall appearance of the leg I feel like he may require [name] biopsy to further evaluate the situation. I discussed that with the patient today. With that being said I previously been asked by Dr. Adolphus Birchwood not to perform any of the biopsies myself but to let them see it prior to the biopsy. For that reason I would go ahead and see about making referral to Dr. Durene Cal office to get this scheduled as quickly as possible is my hope. We reached out to them have not heard back as of yet. 08-08-2023 upon evaluation today patient's wound actually showed signs of doing really about the same with regard to his wound. There does not appear to be any signs of active infection at this time which is good news with that being said they did not biopsy this at the office today when he went  to dermatology. This is unfortunate as I thought that we can do that now. I will get [name] try to see if we get in touch with Dr. Durene Cal office if were not able to get this done before I see him next week, going to just do the biopsy myself just to make sure we know what we are dealing with patient voiced understanding he is in agreement with the plan. 08-15-2023 upon evaluation today patient appears to be doing pretty well currently in regard to his wound which is okay he did have [name] biopsy today apparently they had [name] hard time getting it to stop bleeding that had [name] pressure dressing on pretty tightly the good news is hemostasis has been achieved. With that being said the patient tells me that he unfortunately when they unwrapped him had several areas that were somewhat pussy and opened in regard to his leg I think he may have [name] bit of an infection ensuing again already. 08-22-2023 upon evaluation today patient's leg appears to be somewhat better in regards to the  infection but unfortunately is still having some issues here with the wound the good news is Dr. Durene Cal report did come to me and the patient did have [name] staph infection but otherwise did not have any signs of cancer pathology. 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 08-29-2023 upon evaluation today patient's leg is actually showing signs of significant improvement. Fortunately I see no evidence of worsening overall and I do believe that he is making good headway here towards closure. 09-19-2023 upon evaluation today patient's wound is actually showing signs of excellent improvement and seems to be making really good headway here towards closure. I am actually very pleased with where we stand. I do not see any signs of active infection at this time. 09-26-2023 upon evaluation today patient appears to be still having issues with this lateral leg ulcer he has 2 new places that are showing up as well on the lateral leg on the left. 1 [name] little bit higher up 1 just below. With that being said unfortunately this has continued to be an ongoing issue for him he is obviously very frustrated today which I completely understand. Objective Constitutional Well-nourished and well-hydrated in no acute distress. Vitals Time Taken: 10:53 AM, Height: 75 in, Weight: 220 lbs, BMI: 27.5, Temperature: 98.0 F, Pulse: 70 bpm, Respiratory Rate: 18 breaths/min, Blood Pressure: 167/79 mmHg. Respiratory normal breathing without difficulty. Psychiatric this patient is able to make decisions and demonstrates good insight into disease process. Alert and Oriented x 3. pleasant and cooperative. General Notes: Upon inspection patient's wound bed actually showed signs of good granulation and the 2 smaller new wounds and this actually appears to be doing quite well. Unfortunately in regard to the wound that has been present he has [name] lot of hypergranulation I think we can have to use the Hydrofera  Blue directly to the wound bed to prevent this from continuing to build up and he voiced understanding. I did perform [name] fairly aggressive debridement to remove the hypergranulation postdebridement this looks much better. Integumentary (Hair, Skin) Wound #21R status is Open. Original cause of wound was Gradually Appeared. The date acquired was: 04/08/2023. The wound has been in treatment 19 weeks. The wound is located on the Left,Lateral Lower Leg. The wound measures 2.5cm length x 1.7cm width x 0.3cm depth; 3.338cm^2 area and 1.001cm^3 volume. There is [name] none present amount of drainage noted. 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 There was [name] Excisional Skin/Subcutaneous Tissue Debridement with [name] total area of 3.34 sq cm performed by Allen Derry, PA-C. With the following instrument(s): Curette to remove Viable and Non-Viable tissue/material. Material removed includes Subcutaneous Tissue and Hyper-granulation and after achieving pain control using Lidocaine 4% T opical Solution. No specimens were taken. [name] time out was conducted at 11:01, prior to the start of the procedure. [name] Large amount of bleeding was controlled with Silver Nitrate. The procedure was tolerated well with [name] pain level of 1 throughout and [name] pain level of 1 following the procedure. Post Debridement Measurements: 2.5cm length x 1.7cm width x 0.3cm depth; 1.001cm^3 volume. Character of Wound/Ulcer Post Debridement is  stable. 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 There was [name] Four Layer Compression Therapy Procedure by [name], RN. Post procedure Diagnosis Wound #21R: Same as Pre-Procedure Plan Follow-up Appointments: Return Appointment in 1 week. Nurse Visit as needed Bathing/ Shower/ Hygiene: May shower; gently cleanse wound with antibacterial soap, rinse and pat dry prior to dressing wounds Edema Control - Orders / Instructions: UrgoK2 Elevate, Exercise Daily and Avoid Standing for Long Periods of Time. Elevate leg(s) parallel to the floor when sitting. WOUND #21R: - Lower Leg Wound Laterality: Left, Lateral Cleanser: Soap and Water 1 x Per Week/30 Days Discharge Instructions: Gently cleanse wound with antibacterial soap, rinse and pat dry prior to dressing wounds Cleanser: Wound Cleanser 1 x Per Week/30 Days Discharge Instructions: Wash your hands with soap and water. Remove old dressing, discard into plastic bag and place into trash. Cleanse the wound with Wound Cleanser prior to applying [name] clean dressing using gauze sponges, not tissues or cotton balls. Do not scrub or use excessive force. Pat dry using gauze sponges, not tissue or cotton balls. Peri-Wound Care: Desitin Maximum Strength Ointment 4 (oz) 1 x Per Week/30 Days Discharge Instructions: Apply around wound edges to avoid maceration Topical: Triamcinolone Acetonide Cream, 0.1%, 15 (g) tube 1 x Per Week/30 Days Discharge Instructions: Apply to entire lower leg Topical: 1 x Per Week/30 Days Prim Dressing: Hydrofera Blue Ready Transfer Foam, 2.5x2.5 (in/in) 1 x Per Week/30 Days ary Discharge Instructions: Apply Hydrofera Blue Ready to wound bed as directed Secondary Dressing: Zetuvit Plus 4x8 (in/in) 1 x Per Week/30 Days Com pression Wrap: Urgo K2, two layer compression system, regular 1 x Per Week/30 Days 1. I  would recommend based on what we are seeing that we have the patient go ahead and continue with the [facility] that were not going to use of the contact layer between. 2. I am also can recommend that we have the patient continue with the triamcinolone to the leg. 3. I would also recommend Xeroform to the 2 smaller wounds which I think that she did quite well for her. We will see patient back for reevaluation in 1 week here in the clinic. If anything worsens or changes patient will contact our office for additional recommendations. Electronic Signature(s) Signed: 09/26/2023 1:51:41 PM By: Allen Derry PA-C Entered  By: Allen Derry on 09/26/2023 10:51:41 [name] ([id number]) [id number] Page 13 of 13 -------------------------------------------------------------------------------- SuperBill Details Patient Name: Date of Service: [name] 09/26/2023 Medical Record Number: [id number] Patient Account Number: [id number] Date of Birth/Sex: Treating RN: Jul 27, 1946 (77 y.o. [name] Primary Care Provider: [name] Other Clinician: Referring Provider: Treating Provider/Extender: [name] in Treatment: 19 Diagnosis Coding ICD-10 Codes Code Description I89.0 Lymphedema, not elsewhere classified 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 L97.822 Non-pressure chronic ulcer of other part of left lower leg with fat layer exposed I73.89 Other specified peripheral vascular diseases I48.0 Paroxysmal atrial fibrillation I10 Essential (primary) hypertension Facility Procedures : CPT4 Code: [id number] Description: 11042 - DEB SUBQ TISSUE 20 SQ CM/< ICD-10 Diagnosis Description L97.822 Non-pressure chronic ulcer of other part of left lower leg with fat layer expo Modifier: sed Quantity: 1 Physician Procedures : CPT4 Code Description Modifier 11042 11042 - WC  PHYS SUBQ TISS 20 SQ CM ICD-10 Diagnosis Description L97.822 Non-pressure chronic ulcer of other part of left lower leg with fat layer exposed Quantity: 1 Electronic Signature(s) Signed: 09/26/2023 1:53:52 PM By: Allen Derry PA-C Entered By: Allen Derry on 09/26/2023 10:53:52

## 2023-09-27 NOTE — Patient Instructions (Signed)
Medication Instructions:  Your Physician recommend you continue on your current medication as directed.    *If you need a refill on your cardiac medications before your next appointment, please call your pharmacy*   Lab Work: None ordered at this time    Follow-Up: At [facility], you and your health needs are our priority.  As part of our continuing mission to provide you with exceptional heart care, we have created designated Provider Care Teams.  These Care Teams include your primary Cardiologist (physician) and Advanced Practice Providers (APPs -  Physician Assistants and Nurse Practitioners) who all work together to provide you with the care you need, when you need it.  We recommend signing up for the patient portal called "MyChart".  Sign up information is provided on this After Visit Summary.  MyChart is used to connect with patients for Virtual Visits (Telemedicine).  Patients are able to view lab/test results, encounter notes, upcoming appointments, etc.  Non-urgent messages can be sent to your provider as well.   To learn more about what you can do with MyChart, go to [email or web address].    Your next appointment:   1 year(s)  Provider:   You may see Bryan Lemma, MD or one of the following Advanced Practice Providers on your designated Care Team:   Nicolasa Ducking, NP Eula Listen, PA-C Cadence Fransico Michael, PA-C Charlsie Quest, NP Carlos Levering, NP

## 2023-09-28 NOTE — Progress Notes (Signed)
[name], [name] ([id number]) [id number] Page 1 of 9 Visit Report for 09/26/2023 Arrival Information Details Patient Name: Date of Service: [name] 09/26/2023 10:15 [name] Medical Record Number: [id number] Patient Account Number: [id number] Date of Birth/Sex: Treating RN: 1946-08-28 (77 y.o. [name] Primary Care Cailen Mihalik: [name] Other Clinician: Referring Bruce Mayers: Treating Tyriq Moragne/Extender: [name] in Treatment: 19 Visit Information History Since Last Visit Added or deleted any medications: No Patient Arrived: Ambulatory Any new allergies or adverse reactions: No Arrival Time: 10:43 Has Dressing in Place as Prescribed: Yes Accompanied By: self Has Compression in Place as Prescribed: Yes Transfer Assistance: None Pain Present Now: No Patient Identification Verified: Yes Secondary Verification Process Completed: Yes Patient Requires Transmission-Based Precautions: No Patient Has Alerts: No Electronic Signature(s) Signed: 09/27/2023 12:01:12 PM By: [name] MSN RN CNS WTA Entered By: [name] on 09/26/2023 07:43:47 -------------------------------------------------------------------------------- Clinic Level of Care Assessment Details Patient Name: Date of Service: [name] [facility] [name] 09/26/2023 10:15 [name] Medical Record Number: [id number] Patient Account Number: [id number] Date of Birth/Sex: Treating RN: 23-Aug-1946 (77 y.o. [name] Primary Care Tyshia Fenter: [name] Other Clinician: Referring Dorlis Judice: Treating Edvardo Honse/Extender: [name] in Treatment: 19 Clinic Level of Care Assessment Items TOOL 1 Quantity Score []  - 0 Use when EandM and Procedure is performed on INITIAL visit ASSESSMENTS - Nursing Assessment / Reassessment []  - 0 General Physical Exam (combine w/ comprehensive assessment (listed just below) when performed on new pt. evals) []  -  0 Comprehensive Assessment (HX, ROS, Risk Assessments, Wounds Hx, etc.) ASSESSMENTS - Wound and Skin Assessment / Reassessment []  - 0 Dermatologic / Skin Assessment (not related to wound area) ASSESSMENTS - Ostomy and/or Continence Assessment and Care [name], [name] ([id number]) [id number] Page 2 of 9 []  - 0 Incontinence Assessment and Management []  - 0 Ostomy Care Assessment and Management (repouching, etc.) PROCESS - Coordination of Care []  - 0 Simple Patient / Family Education for ongoing care []  - 0 Complex (extensive) Patient / Family Education for ongoing care []  - 0 Staff obtains Chiropractor, Records, T Results / Process Orders est []  - 0 Staff telephones HHA, Nursing Homes / Clarify orders / etc []  - 0 Routine Transfer to another Facility (non-emergent condition) []  - 0 Routine Hospital Admission (non-emergent condition) []  - 0 New Admissions / Manufacturing engineer / Ordering NPWT Apligraf, etc. , []  - 0 Emergency Hospital Admission (emergent condition) PROCESS - Special Needs []  - 0 Pediatric / Minor Patient Management []  - 0 Isolation Patient Management []  - 0 Hearing / Language / Visual special needs []  - 0 Assessment of Community assistance (transportation, [name], etc.) []  - 0 Additional assistance / Altered mentation []  - 0 Support Surface(s) Assessment (bed, cushion, seat, etc.) INTERVENTIONS - Miscellaneous []  - 0 External ear exam []  - 0 Patient Transfer (multiple staff / Nurse, adult / Similar devices) []  - 0 Simple Staple / Suture removal (25 or less) []  - 0 Complex Staple / Suture removal (26 or more) []  - 0 Hypo/Hyperglycemic Management (do not check if billed separately) []  - 0 Ankle / Brachial Index (ABI) - do not check if billed separately Has the patient been seen at the hospital within the last three years: Yes Total Score: 0 Level Of Care: ____ Electronic Signature(s) Signed: 09/27/2023 12:01:12 PM  By: [name] MSN RN CNS WTA Entered By: [name] on 09/26/2023 08:06:23 -------------------------------------------------------------------------------- Compression Therapy Details Patient Name:  Date of Service: [name] 09/26/2023 10:15 [name] Medical Record Number: [id number] Patient Account Number: [id number] Date of Birth/Sex: Treating RN: Sep 15, 1946 (77 y.o. [name] Primary Care Sandeep Radell: [name] Other Clinician: Referring Chinonso Linker: Treating Alyvia Derk/Extender: [name] in Treatment: 19 Compression Therapy Performed for Wound Assessment: Wound #21R Left,Lateral Lower Leg Performed By: Clinician [name], RN Compression [address] [place] [name] ([id number]) [id number] Page 3 of 9 Post Procedure Diagnosis Same as Pre-procedure Electronic Signature(s) Signed: 09/27/2023 12:01:12 PM By: [name] MSN RN CNS WTA Entered By: [name] on 09/26/2023 08:04:36 -------------------------------------------------------------------------------- Encounter Discharge Information Details Patient Name: Date of Service: [name], [name] 09/26/2023 10:15 [name] Medical Record Number: [id number] Patient Account Number: [id number] Date of Birth/Sex: Treating RN: [birth year] (77 y.o. [name] Primary Care Breely Panik: [name] Other Clinician: Referring Ronelle Smallman: Treating Aimee Timmons/Extender: [name] in Treatment: 19 Encounter Discharge Information Items Post Procedure Vitals Discharge Condition: Stable Temperature (F): 98.0 Ambulatory Status: Ambulatory Pulse (bpm): 70 Discharge Destination: Home Respiratory Rate (breaths/min): 18 Transportation: Private Auto Blood Pressure (mmHg): 167/79 Accompanied By: self Schedule Follow-up Appointment: Yes Clinical Summary of Care: Electronic Signature(s) Signed: 09/27/2023 12:01:12 PM By: [name] MSN RN CNS  WTA Entered By: [name] on 09/26/2023 08:08:10 -------------------------------------------------------------------------------- Lower Extremity Assessment Details Patient Name: Date of Service: [name], [name] 09/26/2023 10:15 [name] Medical Record Number: [id number] Patient Account Number: [id number] Date of Birth/Sex: Treating RN: 1946-04-22 (77 y.o. [name] Primary Care Maryann Mccall: [name] Other Clinician: Referring Jovita Persing: Treating Addisen Chappelle/Extender: [name] in Treatment: 19 Edema Assessment Assessed: Kyra Searles: Yes] Franne Forts: No] [Left: Edema] Franne Forts: :] Calf Left: RightKIEV, [name] ([id number]) [id number] Page 4 of 9 Point of Measurement: 37 cm From Medial Instep 35 cm Ankle Left: Right: Point of Measurement: 12 cm From Medial Instep 23.3 cm Vascular Assessment Pulses: Dorsalis Pedis Palpable: [Left:Yes] Extremity colors, hair growth, and conditions: Extremity Color: [Left:Hyperpigmented] Hair Growth on Extremity: [Left:No] Temperature of Extremity: [Left:Warm] Capillary Refill: [Left:< 3 seconds] Dependent Rubor: [Left:No] Blanched when Elevated: [Left:No No] Toe Nail Assessment Left: Right: Thick: No Discolored: No Deformed: No Improper Length and Hygiene: No Electronic Signature(s) Signed: 09/27/2023 12:01:12 PM By: [name] MSN RN CNS WTA Entered By: [name] on 09/26/2023 08:03:39 -------------------------------------------------------------------------------- Multi Wound Chart Details Patient Name: Date of Service: [name], [name] 09/26/2023 10:15 [name] Medical Record Number: [id number] Patient Account Number: [id number] Date of Birth/Sex: Treating RN: Dec 30, 1945 (77 y.o. [name] Primary Care Kamuela Magos: [name] Other Clinician: Referring Cambelle Suchecki: Treating Summer Mccolgan/Extender: [name] in Treatment: 19 Vital Signs Height(in):  75 Pulse(bpm): 70 Weight(lbs): 220 Blood Pressure(mmHg): 167/79 Body Mass Index(BMI): 27.5 Temperature(F): 98.0 Respiratory Rate(breaths/min): 18 [21R:Photos:] [[name]:[name]] Left, Lateral Lower Leg [name] Wound Location: [facility], [facility] ([id number]) [id number] Page 5 of 9 Gradually Appeared [name] Wounding Event: Vasculitis [name] Primary Etiology: Arrhythmia, Hypertension, Gout [name] Comorbid History: 04/08/2023 [name] Date Acquired: 22 [name] Weeks of Treatment: Open [name] Wound Status: No [name] Wound Recurrence: 2.5x1.7x0.3 [name] Measurements L x W x D (cm) 3.338 [name] [name] (cm) : rea 1.001 [name] Volume (cm) : 75.70% [name] % Reduction in [name] rea: 27.10% [name] % Reduction in Volume: Full Thickness Without Exposed [name] Classification: Support Structures None Present [name] Exudate Amount: Debridement -  Excisional [name] Debridement: Pre-procedure Verification/Time Out 11:01 [name] Taken: Lidocaine 4% T opical Solution [name] Pain Control: Subcutaneous [name] Tissue Debrided: Skin/Subcutaneous Tissue [name] Level: 3.34 [name] Debridement [name] (sq cm): rea Curette [name] Instrument: Large [name] Bleeding: Silver Nitrate [name] Hemostasis [name] chieved: 1 [name] Procedural Pain: 1 [name] Post Procedural Pain: Procedure was tolerated well [name] Debridement Treatment Response: 2.5x1.7x0.3 [name] Post Debridement Measurements L x W x D (cm) 1.001 [name] Post Debridement Volume: (cm) Debridement [name] Procedures Performed: Treatment Notes Electronic Signature(s) Signed: 09/27/2023 12:01:12 PM By: [name] MSN RN CNS WTA Entered By: [name] on 09/26/2023 08:04:11 -------------------------------------------------------------------------------- Multi-Disciplinary Care Plan Details Patient Name: Date of Service: [name], [name] 09/26/2023 10:15 [name] Medical Record Number:  [id number] Patient Account Number: [id number] Date of Birth/Sex: Treating RN: [birth year] (77 y.o. [name] Primary Care Beila Purdie: [name] Other Clinician: Referring Arvo Ealy: Treating Coree Riester/Extender: [name] in Treatment: 19 Active Inactive Wound/Skin Impairment Nursing Diagnoses: Knowledge deficit related to ulceration/compromised skin integrity Goals: Patient/caregiver will verbalize understanding of skin care regimen Date Initiated: 05/16/2023 Target Resolution Date: 10/07/2023 Goal Status: Active Ulcer/skin breakdown will have [name] volume reduction of 30% by week 4 Date Initiated: 05/16/2023 Date Inactivated: 06/20/2023 Target Resolution Date: 06/16/2023 Goal Status: [name], [name] ([id number]) [id number] Page 6 of 9 Ulcer/skin breakdown will have [name] volume reduction of 50% by week 8 Date Initiated: 05/16/2023 Date Inactivated: 07/17/2023 Target Resolution Date: 07/16/2023 Goal Status: Met Ulcer/skin breakdown will have [name] volume reduction of 80% by week 12 Date Initiated: 05/16/2023 Date Inactivated: 07/17/2023 Target Resolution Date: 08/16/2023 Goal Status: Met Ulcer/skin breakdown will heal within 14 weeks Date Initiated: 05/16/2023 Date Inactivated: 07/17/2023 Target Resolution Date: 09/15/2023 Goal Status: Met Interventions: Assess patient/caregiver ability to obtain necessary supplies Assess patient/caregiver ability to perform ulcer/skin care regimen upon admission and as needed Assess ulceration(s) every visit Notes: Electronic Signature(s) Signed: 09/27/2023 12:01:12 PM By: [name] MSN RN CNS WTA Entered By: [name] on 09/26/2023 08:06:53 -------------------------------------------------------------------------------- Pain Assessment Details Patient Name: Date of Service: [name], [name] 09/26/2023 10:15 [name] Medical Record Number: [id number] Patient Account Number: [id number] Date of  Birth/Sex: Treating RN: 03/15/1946 (77 y.o. [name] Primary Care Vermell Madrid: [name] Other Clinician: Referring Annalee Meyerhoff: Treating John Vasconcelos/Extender: [name] in Treatment: 19 Active Problems Location of Pain Severity and Description of Pain Patient Has Paino No Site Locations Pain Management and Medication Current Pain Management: Electronic Signature(s) Signed: 09/27/2023 12:01:12 PM By: [name] MSN RN CNS WTA Entered By: [name] on 09/26/2023 08:03:30 [name], [name] [id number] Page 7 of 9 -------------------------------------------------------------------------------- Patient/Caregiver Education Details Patient Name: Date of Service: [name] 12/19/2024andnbsp10:15 [name] Medical Record Number: [id number] Patient Account Number: [id number] Date of Birth/Gender: Treating RN: 09-23-1946 (77 y.o. [name] Primary Care Physician: [name] Other Clinician: Referring Physician: Treating Physician/Extender: [name] in Treatment: 19 Education Assessment Education Provided To: Patient Education Topics Provided Wound Debridement: Handouts: Wound Debridement Methods: Explain/Verbal Responses: State content correctly Wound/Skin Impairment: Handouts: Caring for Your Ulcer Methods: Explain/Verbal Responses: State content correctly Electronic Signature(s) Signed: 09/27/2023 12:01:12 PM By: [name] MSN RN CNS WTA Entered By: [name] on 09/26/2023 08:07:09 -------------------------------------------------------------------------------- Wound Assessment Details Patient Name: Date of Service: [name], [name] 09/26/2023 10:15 [name] Medical Record  Number: [id number] Patient Account Number: [id number] Date of Birth/Sex: Treating RN: Jan 27, 1946 (77 y.o. [name] Primary Care Nehan Flaum: [name] Other Clinician: Referring  Aylen Rambert: Treating Antigone Crowell/Extender: [name] in Treatment: 19 Wound Status Wound Number: 21R Primary Etiology: Vasculitis Wound Location: Left, Lateral Lower Leg Wound Status: Open Wounding Event: Gradually Appeared Comorbid History: Arrhythmia, Hypertension, Gout Date Acquired: 04/08/2023 [name] Of Treatment: [address] [place] [name] ([id number]) [id number] Page 8 of 9 Clustered Wound: No Photos Wound Measurements Length: (cm) 2.5 Width: (cm) 1.7 Depth: (cm) 0.3 Area: (cm) 3.338 Volume: (cm) 1.001 % Reduction in Area: 75.7% % Reduction in Volume: 27.1% Wound Description Classification: Full Thickness Without Exposed Suppor Exudate Amount: None Present t Structures Treatment Notes Wound #21R (Lower Leg) Wound Laterality: Left, Lateral Cleanser Soap and Water Discharge Instruction: Gently cleanse wound with antibacterial soap, rinse and pat dry prior to dressing wounds Wound Cleanser Discharge Instruction: Wash your hands with soap and water. Remove old dressing, discard into plastic bag and place into trash. Cleanse the wound with Wound Cleanser prior to applying [name] clean dressing using gauze sponges, not tissues or cotton balls. Do not scrub or use excessive force. Pat dry using gauze sponges, not tissue or cotton balls. Peri-Wound Care Desitin Maximum Strength Ointment 4 (oz) Discharge Instruction: Apply around wound edges to avoid maceration Topical Triamcinolone Acetonide Cream, 0.1%, 15 (g) tube Discharge Instruction: Apply to entire lower leg Primary Dressing Hydrofera Blue Ready Transfer Foam, 2.5x2.5 (in/in) Discharge Instruction: Apply Hydrofera Blue Ready to wound bed as directed Secondary Dressing Zetuvit Plus 4x8 (in/in) Secured With Compression Wrap Urgo K2, two layer compression system, regular Compression Stockings Add-Ons Electronic Signature(s) Signed: 09/27/2023 12:01:12 PM By: [name] MSN RN CNS  WTA Entered By: [name] on 09/26/2023 08:02:57 [name] ([id number]) [id number] Page 9 of 9 -------------------------------------------------------------------------------- Vitals Details Patient Name: Date of Service: [name] 09/26/2023 10:15 [name] Medical Record Number: [id number] Patient Account Number: [id number] Date of Birth/Sex: Treating RN: 1946-07-14 (77 y.o. [name] Primary Care Mckayla Mulcahey: [name] Other Clinician: Referring Shahidah Nesbitt: Treating Bryannah Boston/Extender: [name] in Treatment: 19 Vital Signs Time Taken: 10:53 Temperature (F): 98.0 Height (in): 75 Pulse (bpm): 70 Weight (lbs): 220 Respiratory Rate (breaths/min): 18 Body Mass Index (BMI): 27.5 Blood Pressure (mmHg): 167/79 Reference Range: 80 - 120 mg / dl Electronic Signature(s) Signed: 09/27/2023 12:01:12 PM By: [name] MSN RN CNS WTA Entered By: [name] on 09/26/2023 08:03:23

## 2023-10-03 DIAGNOSIS — I10 Essential (primary) hypertension: Secondary | ICD-10-CM | POA: Diagnosis not present

## 2023-10-03 DIAGNOSIS — I482 Chronic atrial fibrillation, unspecified: Secondary | ICD-10-CM | POA: Diagnosis not present

## 2023-10-03 DIAGNOSIS — L97822 Non-pressure chronic ulcer of other part of left lower leg with fat layer exposed: Secondary | ICD-10-CM | POA: Diagnosis not present

## 2023-10-03 DIAGNOSIS — M109 Gout, unspecified: Secondary | ICD-10-CM | POA: Diagnosis not present

## 2023-10-03 DIAGNOSIS — I480 Paroxysmal atrial fibrillation: Secondary | ICD-10-CM | POA: Diagnosis not present

## 2023-10-03 DIAGNOSIS — I890 Lymphedema, not elsewhere classified: Secondary | ICD-10-CM | POA: Diagnosis not present

## 2023-10-03 DIAGNOSIS — I872 Venous insufficiency (chronic) (peripheral): Secondary | ICD-10-CM | POA: Diagnosis not present

## 2023-10-03 DIAGNOSIS — I739 Peripheral vascular disease, unspecified: Secondary | ICD-10-CM | POA: Diagnosis not present

## 2023-10-03 DIAGNOSIS — I87333 Chronic venous hypertension (idiopathic) with ulcer and inflammation of bilateral lower extremity: Secondary | ICD-10-CM | POA: Diagnosis not present

## 2023-10-03 NOTE — Progress Notes (Signed)
MASUD, WILKINS [name] ([id number]) [id number] Page 1 of 4 Visit Report for 10/03/2023 Arrival Information Details Patient Name: Date of Service: [name] 10/03/2023 3:45 PM Medical Record Number: [id number] Patient Account Number: [id number] Date of Birth/Sex: Treating RN: 1946-09-10 (77 y.o. [name] Primary Care Konica Stankowski: [name] Other Clinician: Referring Stoy Fenn: Treating Mayuri Staples/Extender: Chauncey Mann, MICHA EL Shanda Bumps in Treatment: 20 Visit Information History Since Last Visit Added or deleted any medications: No Patient Arrived: Ambulatory Any new allergies or adverse reactions: No Arrival Time: 15:45 Had a fall or experienced change in No Accompanied By: self activities of daily living that may affect Transfer Assistance: None risk of falls: Patient Identification Verified: Yes Hospitalized since last visit: No Secondary Verification Process Completed: Yes Has Dressing in Place as Prescribed: Yes Patient Requires Transmission-Based Precautions: No Has Compression in Place as Prescribed: Yes Patient Has Alerts: No Pain Present Now: No Electronic Signature(s) Signed: 10/03/2023 4:09:49 PM By: Angelina Pih Entered By: Angelina Pih on 10/03/2023 13:04:39 -------------------------------------------------------------------------------- Clinic Level of Care Assessment Details Patient Name: Date of Service: [name] 10/03/2023 3:45 PM Medical Record Number: [id number] Patient Account Number: [id number] Date of Birth/Sex: Treating RN: 1946/09/03 (77 y.o. [name] Primary Care Jhordan Mckibben: [name] Other Clinician: Referring Katianne Barre: Treating Shahin Knierim/Extender: RO BSO Dorris Carnes, MICHA EL Shanda Bumps in Treatment: 20 Clinic Level of Care Assessment Items TOOL 1 Quantity Score []  - 0 Use when EandM and Procedure is performed on INITIAL visit ASSESSMENTS - Nursing Assessment /  Reassessment []  - 0 General Physical Exam (combine w/ comprehensive assessment (listed just below) when performed on new pt. evals) []  - 0 Comprehensive Assessment (HX, ROS, Risk Assessments, Wounds Hx, etc.) ASSESSMENTS - Wound and Skin Assessment / Reassessment []  - 0 Dermatologic / Skin Assessment (not related to wound area) HELIO, TEDFORD [name] [id number] Page 2 of 4 ASSESSMENTS - Ostomy and/or Continence Assessment and Care []  - 0 Incontinence Assessment and Management []  - 0 Ostomy Care Assessment and Management (repouching, etc.) PROCESS - Coordination of Care []  - 0 Simple Patient / Family Education for ongoing care []  - 0 Complex (extensive) Patient / Family Education for ongoing care []  - 0 Staff obtains Chiropractor, Records, T Results / Process Orders est []  - 0 Staff telephones HHA, Nursing Homes / Clarify orders / etc []  - 0 Routine Transfer to another Facility (non-emergent condition) []  - 0 Routine Hospital Admission (non-emergent condition) []  - 0 New Admissions / Manufacturing engineer / Ordering NPWT Apligraf, etc. , []  - 0 Emergency Hospital Admission (emergent condition) PROCESS - Special Needs []  - 0 Pediatric / Minor Patient Management []  - 0 Isolation Patient Management []  - 0 Hearing / Language / Visual special needs []  - 0 Assessment of Community assistance (transportation, [name], etc.) []  - 0 Additional assistance / Altered mentation []  - 0 Support Surface(s) Assessment (bed, cushion, seat, etc.) INTERVENTIONS - Miscellaneous []  - 0 External ear exam []  - 0 Patient Transfer (multiple staff / Nurse, adult / Similar devices) []  - 0 Simple Staple / Suture removal (25 or less) []  - 0 Complex Staple / Suture removal (26 or more) []  - 0 Hypo/Hyperglycemic Management (do not check if billed separately) []  - 0 Ankle / Brachial Index (ABI) - do not check if billed separately Has the patient been seen at  the hospital within the last three years: Yes Total Score: 0 Level Of Care: ____ Electronic  Signature(s) Signed: 10/03/2023 4:09:49 PM By: Angelina Pih Entered By: Angelina Pih on 10/03/2023 13:07:24 -------------------------------------------------------------------------------- Compression Therapy Details Patient Name: Date of Service: [name] [name] 10/03/2023 3:45 PM Medical Record Number: [id number] Patient Account Number: [id number] Date of Birth/Sex: Treating RN: [birth year] (77 y.o. [name] Primary Care Gabryella Murfin: [name] Other Clinician: Referring Laramie Meissner: Treating Radhika Dershem/Extender: RO BSO Dorris Carnes, MICHA EL Shanda Bumps in Treatment: 20 Compression Therapy Performed for Wound Assessment: Wound #21R Left,Lateral Lower Leg Performed By: Holly Bodily, RN Compression TypeQUANG, KUBES ([id number]) [id number] Page 3 of 4 Electronic Signature(s) Signed: 10/03/2023 4:09:49 PM By: Angelina Pih Entered By: Angelina Pih on 10/03/2023 13:05:09 -------------------------------------------------------------------------------- Encounter Discharge Information Details Patient Name: Date of Service: [name] [name], [name] 10/03/2023 3:45 PM Medical Record Number: [id number] Patient Account Number: [id number] Date of Birth/Sex: Treating RN: 15-Jul-1946 (77 y.o. [name] Primary Care Hawke Villalpando: [name] Other Clinician: Referring Jalyn Rosero: Treating Meric Joye/Extender: RO BSO Dorris Carnes, MICHA EL Shanda Bumps in Treatment: 20 Encounter Discharge Information Items Discharge Condition: Stable Ambulatory Status: Ambulatory Discharge Destination: Home Transportation: Private Auto Accompanied By: self Schedule Follow-up Appointment: Yes Clinical Summary of Care: Electronic Signature(s) Signed: 10/03/2023 4:09:49 PM By: Angelina Pih Entered By: Angelina Pih on 10/03/2023  13:07:18 -------------------------------------------------------------------------------- Wound Assessment Details Patient Name: Date of Service: [name] [name] 10/03/2023 3:45 PM Medical Record Number: [id number] Patient Account Number: [id number] Date of Birth/Sex: Treating RN: 10-21-1945 (77 y.o. [name] Primary Care Zani Kyllonen: [name] Other Clinician: Referring Stepanie Graver: Treating Dorrien Grunder/Extender: Chauncey Mann, MICHA EL Shanda Bumps in Treatment: 20 Wound Status Wound Number: 21R Primary Etiology: Vasculitis Wound Location: Left, Lateral Lower Leg Wound Status: Open Wounding Event: Gradually Appeared Comorbid History: Arrhythmia, Hypertension, Gout Date Acquired: 04/08/2023 Weeks Of Treatment: 20 Clustered Wound: No Wound Measurements EUSTACIO, LUTH [name] ([id number]) Length: (cm) 2.5 Width: (cm) 1.7 Depth: (cm) 0.3 Area: (cm) 3.338 Volume: (cm) 1.001 [id number] Page 4 of 4 % Reduction in Area: 75.7% % Reduction in Volume: 27.1% Wound Description Classification: Full Thickness Without Exposed Support S Exudate Amount: None Present tructures Treatment Notes Wound #21R (Lower Leg) Wound Laterality: Left, Lateral Cleanser Soap and Water Discharge Instruction: Gently cleanse wound with antibacterial soap, rinse and pat dry prior to dressing wounds Wound Cleanser Discharge Instruction: Wash your hands with soap and water. Remove old dressing, discard into plastic bag and place into trash. Cleanse the wound with Wound Cleanser prior to applying a clean dressing using gauze sponges, not tissues or cotton balls. Do not scrub or use excessive force. Pat dry using gauze sponges, not tissue or cotton balls. Peri-Wound Care Desitin Maximum Strength Ointment 4 (oz) Discharge Instruction: Apply around wound edges to avoid maceration Topical Triamcinolone Acetonide Cream, 0.1%, 15 (g) tube Discharge Instruction: Apply to entire  lower leg Primary Dressing Hydrofera Blue Ready Transfer Foam, 2.5x2.5 (in/in) Discharge Instruction: Apply Hydrofera Blue Ready to wound bed as directed Secondary Dressing Zetuvit Plus 4x8 (in/in) Secured With Compression Wrap Urgo K2, two layer compression system, regular Compression Stockings Add-Ons Electronic Signature(s) Signed: 10/03/2023 4:09:49 PM By: Angelina Pih Entered By: Angelina Pih on 10/03/2023 13:04:59

## 2023-10-04 NOTE — Progress Notes (Signed)
JERRIN, COHAN R ([id number]) [id number] Page 1 of 2 Visit Report for 10/03/2023 Physician Orders Details Patient Name: Date of Service: [name] 10/03/2023 3:45 PM Medical Record Number: [id number] Patient Account Number: [id number] Date of Birth/Sex: Treating RN: 1946-01-17 (77 y.o. [name] Primary Care Provider: [name] Other Clinician: Referring Provider: Treating Provider/Extender: RO BSO Dorris Carnes, MICHA EL Shanda Bumps in Treatment: 20 The following information was scribed by: Angelina Pih The information was scribed for: Maxwell Caul Verbal / Phone Orders: No Diagnosis Coding Follow-up Appointments Return Appointment in 1 week. Nurse Visit as needed Bathing/ Shower/ Hygiene May shower; gently cleanse wound with antibacterial soap, rinse and pat dry prior to dressing wounds Edema Control - Orders / Instructions UrgoK2 Elevate, Exercise Daily and A void Standing for Long Periods of Time. Elevate leg(s) parallel to the floor when sitting. Non-Wound Condition dditional non-wound orders/instructions: - area to left proximal lower leg that had xeroform on it, pt states was there last visit, re-applied xerform A with dressing change, needs to be assessed and added to orders next provider visit. Wound Treatment Wound #21R - Lower Leg Wound Laterality: Left, Lateral Cleanser: Soap and Water 1 x Per Week/30 Days Discharge Instructions: Gently cleanse wound with antibacterial soap, rinse and pat dry prior to dressing wounds Cleanser: Wound Cleanser 1 x Per Week/30 Days Discharge Instructions: Wash your hands with soap and water. Remove old dressing, discard into plastic bag and place into trash. Cleanse the wound with Wound Cleanser prior to applying a clean dressing using gauze sponges, not tissues or cotton balls. Do not scrub or use excessive force. Pat dry using gauze sponges, not tissue or cotton  balls. Peri-Wound Care: Desitin Maximum Strength Ointment 4 (oz) 1 x Per Week/30 Days Discharge Instructions: Apply around wound edges to avoid maceration Topical: Triamcinolone Acetonide Cream, 0.1%, 15 (g) tube 1 x Per Week/30 Days Discharge Instructions: Apply to entire lower leg Topical: 1 x Per Week/30 Days Prim Dressing: Hydrofera Blue Ready Transfer Foam, 2.5x2.5 (in/in) 1 x Per Week/30 Days ary Discharge Instructions: Apply Hydrofera Blue Ready to wound bed as directed Secondary Dressing: Zetuvit Plus 4x8 (in/in) 1 x Per Week/30 Days Compression Wrap: Urgo K2, two layer compression system, regular 1 x Per Week/30 Days Electronic Signature(s) Signed: 10/03/2023 4:09:49 PM By: Angelina Pih Signed: 10/04/2023 12:26:56 PM By: Baltazar Najjar MD Entered By: Angelina Pih on 10/03/2023 13:06:59 DOMENIQUE, CUERVO R ([id number]) [id number] Page 2 of 2 -------------------------------------------------------------------------------- SuperBill Details Patient Name: Date of Service: [name] 10/03/2023 Medical Record Number: [id number] Patient Account Number: [id number] Date of Birth/Sex: Treating RN: 1946-06-12 (77 y.o. 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

## 2023-10-08 ENCOUNTER — Encounter: Payer: Medicare PPO | Admitting: Internal Medicine

## 2023-10-08 DIAGNOSIS — I480 Paroxysmal atrial fibrillation: Secondary | ICD-10-CM | POA: Diagnosis not present

## 2023-10-08 DIAGNOSIS — I482 Chronic atrial fibrillation, unspecified: Secondary | ICD-10-CM | POA: Diagnosis not present

## 2023-10-08 DIAGNOSIS — I890 Lymphedema, not elsewhere classified: Secondary | ICD-10-CM | POA: Diagnosis not present

## 2023-10-08 DIAGNOSIS — I87332 Chronic venous hypertension (idiopathic) with ulcer and inflammation of left lower extremity: Secondary | ICD-10-CM | POA: Diagnosis not present

## 2023-10-08 DIAGNOSIS — I739 Peripheral vascular disease, unspecified: Secondary | ICD-10-CM | POA: Diagnosis not present

## 2023-10-08 DIAGNOSIS — L97828 Non-pressure chronic ulcer of other part of left lower leg with other specified severity: Secondary | ICD-10-CM | POA: Diagnosis not present

## 2023-10-08 DIAGNOSIS — I87333 Chronic venous hypertension (idiopathic) with ulcer and inflammation of bilateral lower extremity: Secondary | ICD-10-CM | POA: Diagnosis not present

## 2023-10-08 DIAGNOSIS — I10 Essential (primary) hypertension: Secondary | ICD-10-CM | POA: Diagnosis not present

## 2023-10-08 DIAGNOSIS — L958 Other vasculitis limited to the skin: Secondary | ICD-10-CM | POA: Diagnosis not present

## 2023-10-08 DIAGNOSIS — M109 Gout, unspecified: Secondary | ICD-10-CM | POA: Diagnosis not present

## 2023-10-08 DIAGNOSIS — I872 Venous insufficiency (chronic) (peripheral): Secondary | ICD-10-CM | POA: Diagnosis not present

## 2023-10-08 DIAGNOSIS — L97822 Non-pressure chronic ulcer of other part of left lower leg with fat layer exposed: Secondary | ICD-10-CM | POA: Diagnosis not present

## 2023-10-09 NOTE — Progress Notes (Signed)
 RETT, STEHLIK R ([id number]) [id number] Page 1 of 11 Visit Report for 10/08/2023 HPI Details Patient Name: Date of Service: [name] FABIENE 10/08/2023 1:15 PM Medical Record Number: [id number] Patient Account Number: [id number] Date of Birth/Sex: Treating RN: 16-Oct-1945 (78 y.o. [name] Primary Care Provider: Gasper Golas Other Clinician: Referring Provider: Treating Provider/Extender: CLAUDIE ARVELLA LOISE, MICHA EL KANDICE Gasper Golas Devra in Treatment: 20 History of Present Illness HPI Description: 78 year old male who has a past medical history of essential hypertension, chronic atrial fibrillation, peripheral vascular disease, nonischemic cardiomyopathy,venous stasis dermatitis, gouty arthropathy, basal cell carcinoma of the right lower extremity, benign prostatic hypertrophy, long- term use of anticoagulation therapy, hyperglycemia and exercise intolerance has never been a smoker. the patient has had a vascular workup over 7 years ago and said everything was normal at that stage. He does not have any chronic problems except for cardiac issues which he sees a cardiologist in [place]. 08/15/2017 -- arterial and venous duplex studies still pending. 08/23/2017 -- venous reflux studies done on 08/13/2017 shows venous incompetence throughout the left lower extremity deep system and focally at the left saphenofemoral junction. 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 No evidence of SVT or DVT in bilateral lower extremities The patient has an appointment at the end of the month to get his arterial duplex study done 09/05/2017 -- the patient was seen at the vein and vascular office yesterday by Mariellen Iha. ABI studies were notable for medial calcification and the toe brachial indices were normal and bilateral ankle-brachial) waveforms were normal with triphasic flow. After review of his venous studies he was not a candidate for laser  ablation and his lymphedema was to be treated with compression stockings and lymphedema pump pumps 09/12/2017 -- had a low arterial study done at the [facility] -- unable to obtain reliable ABI is due to medial calcification. Bilateral toe indices were normal with the right being 1.01 and the left being 0.92 and the waveforms were triphasic bilaterally. he did get hold of 30-40 mm compression stockings but is unable to put these on. We will try and get him alternative compression stockings. 09/26/17- he is here in follow up evaluation of a right lower extremity ulcer;he is compliant in wearing compression stocking; ulcer almost epithelialized , anticipate healing next appointment Readmission: 11/17 point upon evaluation patient's wound currently that he is seeing us  for today is a skin cancerous lesion that was cleared away by his dermatologist on the left medial calf region. He tells me that this is a very similar thing to what he had done previously in fact the last time he saw him in 2018 this was also what was going on at that point. Nonetheless he feels that based on what he seeing currently that this is just having a lot of harder time healing although it is much closer to the surface than what he is experienced in the past. He notes that the initial removal was in June 2022 which was this year this is now November and still has not closed. He does have some edema and definitely I think that there is some venous component to his slow healing here. Also think that we can do something better than Vaseline to try to help with getting this to clear up as quickly as possible. He does have a history of atrial fibrillation and is on Eliquis otherwise he really has no major medical problems that would affect wound  healing. 09/07/2021 upon evaluation today patient actually appears to be doing significantly better after having wrapped him last week. Overall I think that this is  making significant improvements at this time which is great news. I do not see any evidence of infection which is great news as well. No fevers, chills, nausea, vomiting, or diarrhea. 09/14/2021 upon evaluation today patient appears to be doing well with regard to his leg ulcer. He has been tolerating the dressing changes and overall I think that he is making excellent progress. I do not see any signs of active infection at this time. 09/21/2021 upon evaluation today patient actually appears to be making good progress with regard to his wound this is again measuring smaller today no debridement seems to be necessary. We have been using a silver collagen dressing and I think that is doing an awesome job. 09/28/2021 upon evaluation today patient appears to be doing well with regard to his leg currently. I do not see any signs of active infection at this time which is great news. No fevers, chills, nausea, vomiting, or diarrhea. I think this wound is very close to complete resolution. 10/12/2021 upon evaluation today patient actually appears to be doing awesome in regard to his leg ulcer. In fact this appears to be completely healed based on what I am seeing currently. I do not see any evidence of active infection locally nor systemically at this time which is also great news. No fevers, chills, nausea, vomiting, or diarrhea. Readmission: 12/07/2021 upon evaluation today patient presents for readmission here in the clinic. He was discharged on 10/12/2021 is completely healed. Unfortunately this has reopened at this point and he is having continual issues with new blisters over both lower extremities. This is even worse than what we previously saw. Nonetheless we did actually check his ABIs today and it did reveal that his ABIs were 0.55 on the left and 0.57 on the right. Subsequently this is a definite change from his last arterial study which showed that he did have good blood flow at 1.01 on the right and  0.92 on the left and that was right at the beginning of 2019. Nonetheless based on what we see currently I do think he tolerated the 3 layer compression wrap but I do believe that we probably need to get him tested for his arterial flow in order to see where things stand and if there is something we can do there that would help prevent this from continue to be an KENSLEY, LARES R ([id number]) [id number] Page 2 of 11 ongoing issue. He did not utilize compression socks in the interim from when he was last here till this time. That something is probably going to need lifelong going forward as well. 3/9; patient presents for follow-up. He has no issues or complaints today. He tolerated the compression wrap well. He had ABIs with TBI's done. He denies signs of infection. 12/21/2021 upon evaluation today patient appears to be doing well with regard to the wounds on his legs. Both are showing signs of significant improvement which is great news although I do believe some sharp debridement would be of benefit here as well. 12/28/2021 upon evaluation today patient appears to be doing well with regard to his wounds. Everything is showing signs of excellent improvement which I am very pleased about. I think that we are headed in the right direction here. Fortunately there does not appear to be any evidence of infection which is great news there is a  little bit of hypergranulation. 01/04/2022 upon evaluation today patient appears to be doing well with regard to his wounds 2 of them are healed 1 is almost so and the other 1 is significantly better. Overall I am extremely pleased with where we stand and I think that he is making excellent progress here. I do not see any evidence of active infection locally nor systemically at this time. 01-16-2022 upon evaluation today patient's wound on the left leg is showing signs of doing quite well. Has not completely cleared at this point but it is  much improved. Fortunately I do not see any signs of infection at this time. No fevers, chills, nausea, vomiting, or diarrhea. 01-23-2022 upon evaluation today patient's wound of the left leg actually appears to be pretty much completely healed which is great news. I do not see any signs of active infection locally or systemically which is excellent. With that being said on the right leg what wound is measuring smaller the other 1 is a new wound that just showed up fortunately its not too bad. Has been using Xeroform here and that seems to be doing decently well which is great news. Unfortunately his blood pressure is significantly high we gave him the readings for the past 4-5 visits as well as a recommendation to make an appointment to go discuss this with his primary care provider patient states that he is going to look into doing this. 01-30-2022 upon evaluation today patient appears to be doing well with regard to his left leg everything appears to be healed. On the right leg the more anterior wound is healed the more medial wound that I been concerned about a possible skin cancer unfortunately still does not look great to me. I do believe that we should probably do a biopsy I have talked about it with him a few times I think though it is probably time to go ahead and do this at this point. 02-09-2022 upon evaluation today patient appears to be doing well with regard to his legs. On the left this appears to be completely healed. On the right he does have 2 areas and be perfectly honest one of them is a skin cancer that he is going to the Mohs surgery clinic for the other seems to be healing nicely. Readmission: 08-02-2022 upon evaluation today patient appears for reevaluation here in our clinic concerning issues that he has been having with wounds over the bilateral lower extremities. I last saw him in May 2023 and at that point we had him completely healed. Unfortunately he is tells me this has  broken down to some degree since that point. 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 He tells me this 1 seems to just be getting bigger and bigger not improving. Fortunately he is not having any significant pain which is good news he does have quite a bit of swelling and he tells me that his fluid pills are not recommended for him to take daily but just in 3-day intervals here and there. 08-09-2022 upon evaluation today patient appears to be doing still somewhat poorly in regard to his legs although in general he does not appear to be feeling as good as he has been. Fortunately there does not appear to be any signs  of infection which is good news. With that being said he is having some issues here with having and overall poor feeling in general which again is good I think going to be the biggest complicating factor. He actually seems to be coughing I do not hear any wheezing right now I did listen to his chest he did not have good airflow down low however makes me suspicious for bronchitis or even possibly pneumonia which could be part of what is going on here as well. Fortunately I do not see any evidence of anything worsening in regard to his legs but I definitely believe that he needs to continue with the compression wraps he took them off yesterday to shower has not had anything on for 24 hours that is why his legs are so swollen today. With regard to his pathology report I did review that it showed some squamous abnormality but no signs of distinct carcinoma. With that being said it was saying that it could be adjacent to a squamous cell carcinoma nonetheless my suggestion is can be that we have the patient take copy of this report and give it to his Mohs surgeon in order for them to see if there  is anything they feel like needs to be done further. With that being said right now I feel like the primary thing is going to be for us  to try to get his swelling down and keep that down into that hand since he is having so much drainage I believe we can have to bring him in for dressing changes twice a week doing a nurse visit on Mondays. 11/9; since the patient was last here he spent the night in the emergency room he received IV Lasix . Also received antibiotics although he was not discharged on either 1 of these. He also saw his cardiology office who put him on regular Lasix  20 mg [previously on as needed Lasix  20 mg]. Per our intake nurse the swelling in his legs is remarkably better but he still has bilateral lower extremity wounds. He still has wounds on the bilateral lower extremities most problematically on the left lateral calf. He has been using silver alginate under 3 layer compression. 08-23-2022 upon evaluation today patient appears to be doing much better than the last time I saw him 2 weeks ago. At that point I was very concerned about how he was doing he did see Dr. Gasper his primary care provider they got him on some blood pressure medication in general his color and overall appearance looks to be doing much improved compared to the last time I saw him. 09-04-2022 upon evaluation today patient appears to be doing well currently in regard to his wounds. Everything is showing signs of improvement which is great news. Fortunately there does not appear to be any signs of active infection locally or systemically at this time. No fevers, chills, nausea, vomiting, or diarrhea. 09-10-2022 upon evaluation today patient appears to be doing better in regard to his wounds although the [facility] was extremely stuck to the wound bed. Fortunately there does not appear to be any signs of infection locally or systemically at this time which is great news. No fevers, chills, nausea, vomiting,  or diarrhea. 09-17-2022 upon evaluation today patient appears to be doing well currently in regard to his wounds in general. The right leg actually showing signs of excellent improvement and very pleased with where things stand in that regard. Fortunately I do not see any evidence of  infection locally or systemically at this time which is great news. No fevers, chills, nausea, vomiting, or diarrhea. 09-24-2022 upon evaluation today patient appears to be doing well currently in regard to his wounds. Things look to be doing quite well. With that being said he did have a result unfortunately on the pathology which showed that he did have a squamous cell carcinoma noted on the biopsy sample I sent last week. He is seeing his dermatologist tomorrow in that regard. With that being said other than that however he seems to really be making some pretty good progress here which is good news. No fevers, chills, nausea, vomiting, or diarrhea. 12/26; the patient has 2 open wounds remaining on the left leg. One is on the left anterior mid tibia and the other is on the right lateral knee just outside of the popliteal fossa. 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 The patient has been to see dermatology [name] who apparently is making him a referral to the [facility] Mohs surgery center. He does not yet have an appointment 10-12-2022 upon evaluation today patient appears to be doing well currently in regard to his wound. He has been tolerating the dressing changes without complication and overall feel like we are headed in the right direction. Fortunately I do not see any signs of infection locally or systemically at this time which is great news. No fevers, chills, nausea, vomiting, or diarrhea. 10-23-2022 upon evaluation today patient appears to be doing well currently in regard to his wound. He has been tolerating the dressing changes without JERIK, FALLETTA ([id number])  [id number] Page 3 of 11 complication. Fortunately there does not appear to be any signs of active infection locally nor systemically which is great news and overall I am extremely pleased with where we stand currently. No fevers, chills, nausea, vomiting, or diarrhea. 10-26-2022 upon evaluation today patient appears to be doing well currently in regard to his wounds. Everything is showing signs of improvement and this is great news. Fortunately I see no evidence of active infection systemically. He does seem to be doing much better in regard to the local infection in regard to his leg. The smell is also greatly improved. Overall I am extremely happy with where we stand today. This is after just a few days with the antibiotic on board. 11-05-2022 upon evaluation today patient appears to be doing well currently in regard to his wounds although the wound where they performed the Mohs surgery does look a little bit hyper granulated I think switching to Hydrofera Blue may be better for him. He voiced understanding. Fortunately there does not appear to be any evidence of active infection locally nor systemically at this time. 11-12-2022 upon evaluation today patient appears to be doing better in regard to both wounds he has been tolerating the dressing changes without complication. There is no signs of infection and in general I think you are doing quite well. No fevers, chills, nausea, vomiting, or diarrhea. 11-20-2022 upon evaluation today patient appears to be doing well currently in regard to his wounds. He has been tolerating the dressing changes without complication. Fortunately there does not appear to be any signs of infection at this time. No fevers, chills, nausea, vomiting, or diarrhea. 11-27-2022 upon evaluation today patient appears to be doing somewhat poorly in regard to his leg in general he has a lot of areas where he looks like he had some spots that popped up. There  with  regard to new possible blisters. In general I am actually very concerned about the fact that the wrap may be causing some irritation here. I think that we can try to not do the wrap for 1 week, and given the prescription for mupirocin ointment which I will send into the pharmacy for him. 12-04-2022 upon evaluation today patient appears to be doing well currently in regard to his wounds in fact he appears to be pretty much completely healed based on what I am seeing at this point. I do not see any signs of active infection locally nor systemically at this time which is great news. No fevers, chills, nausea, vomiting, or diarrhea. 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 I discussed that with him today he is aware of the situation.-2024 upon evaluation patient presents for readmission here in the clinic actually last saw him December 04, 2022. With that being said previously his dermatologist had asked that if there was something the need to be addressed from a dermatology standpoint and specifically biopsy that we make referral back to them to allow them to do it which I am definitely happy to do. 01-31-2023 upon evaluation today patient appears to be doing well currently in regard to his wound in fact this looks better he did see [name] his dermatologist yesterday and they did perform a biopsy. With that being said he actually looks like he is doing much better [name] feels like this may not be a cancerous lesion which will be very good news at the same time I definitely wanted to make sure especially considering his history and the way this looks when we saw him last week. Nonetheless I am extremely pleased with the fact that he is doing so much better at this point. 02-07-2023 upon evaluation today patient appears to be doing well  currently in regard to his wound from the standpoint of size it has not gotten any larger. With that being said he does have some issues here still with what appears to be potentially some infection. Wound does not look quite as good as it did last week. We are still waiting on the results for the pathology. I did put a call into dermatology but I have not heard anything back from them as of yet. 02-14-2023 upon evaluation today patient appears to be doing well with regard to his wound infection is definitely under control and looks much better. Fortunately I do not see any signs of systemic infection with anyone locally I think this is improved greatly. 02-19-2023 upon evaluation today patient appears to be doing well currently in regard to his wound which is actually measuring smaller and looking much better. Fortunately I do not see any evidence of active infection locally nor systemically which is great news and overall I am extremely pleased with where we stand today. 02-26-2023 upon evaluation today patient appears to be doing well currently in regard to his wound. He is actually been tolerating the dressing changes without complication. Fortunately there does not appear to be any signs of active infection locally nor systemically which is great news and overall I am extremely pleased with where we stand currently. 03-12-2023 upon evaluation today patient's wound actually showed signs of excellent improvement. I am very pleased with where we stand I do believe that we are  making good progress here. I do not see any signs of active infection. 03-19-2023 upon evaluation today patient actually appears to be making excellent progress in regard to his leg and getting this closed. In fact is just a very small area that is actually open at this point. I am actually very pleased with what we are seeing today. 03-26-2023 upon evaluation today patient appears to be doing well currently in regard to his leg which  is actually showing signs of being completely healed. Fortunately I do not see any signs of active infection locally or systemically which is great news and in general I do believe that we are moving in the right direction here. Readmission: 05-16-2023 upon evaluation today patient presents for reevaluation here in the clinic concerning a wound on the left anterior/lateral lower extremity. This is similar to where the wound was last time I saw him but not exactly the same. Fortunately there does not appear to be any signs of active infection at this time which is great news. The patient's past medical history really has not changed significantly since last time I saw him. 05-24-2023 upon evaluation patient actually appears to be doing excellent in regard to his leg ulcer. He has been tolerating the dressing changes without complication in general I do feel like there were making excellent headway towards complete closure. I do not see any signs of active infection at this point. 8/29; left lateral lower leg in the setting of chronic venous insufficiency. We have been using Hydrofera Blue under Urgo K2. Wounds are making nice improvements 06-20-2023 upon evaluation today patient appears to be doing well currently in regard to his leg ulcer. This is actually showing signs of being significantly smaller compared even last week's nurse visit this looks much better. Fortunately I do not see any need for sharp debridement he seems to be doing quite well. 06-27-2023 upon evaluation today patient appears to be doing well currently in regard to his wound. He is tolerating the dressing changes without complication. There is a little bit of hypergranulation on the use of silver nitrate today to help keep this under control. Fortunately I do not see any evidence of active infection locally or systemically which is great news. 07-04-2023 upon evaluation today patient appears to be doing well currently in regard to his  wound. He has been tolerating the dressing changes without complication. Fortunately I do not see any signs of active infection locally or systemically which is great news and in general I do believe that he is making really good headway towards complete closure. This looks much better after starting the antibiotics. 07-09-2023 upon evaluation today patient appears to be doing excellent in regard to his leg which is actually significantly improved. I am very pleased with where we stand and I do believe that he is making really good headway towards getting this closed in fact it pretty much appears to be almost closed today there is just a very tiny area that I think needs 1 week to toughen up before we cut him loose completely. 07-16-2023 upon evaluation today patient appears to be doing well currently in regard to his wounds. In fact everything is completely healed and looks to be doing great. Fortunately I do not see any signs of active infection at this time which is great news. No fevers, chills, nausea, vomiting, or diarrhea. SHONE, LEVENTHAL R ([id number]) [id number] Page 4 of 11 07-30-2023 upon evaluation today patient unfortunately has a breakdown of the wound  on the lateral aspect of his leg. Unfortunately I am a little concerned about the overall appearance of the leg I feel like he may require a biopsy to further evaluate the situation. I discussed that with the patient today. With that being said I previously been asked by [name] not to perform any of the biopsies myself but to let them see it prior to the biopsy. For that reason I would go ahead and see about making referral to [name] office to get this scheduled as quickly as possible is my hope. We reached out to them have not heard back as of yet. 08-08-2023 upon evaluation today patient's wound actually showed signs of doing really about the same with regard to his wound. There does not appear to  be any signs of active infection at this time which is good news with that being said they did not biopsy this at the office today when he went to dermatology. This is unfortunate as I thought that we can do that now. I will get a try to see if we get in touch with [name] office if were not able to get this done before I see him next week, going to just do the biopsy myself just to make sure we know what we are dealing with patient voiced understanding he is in agreement with the plan. 08-15-2023 upon evaluation today patient appears to be doing pretty well currently in regard to his wound which is okay he did have a biopsy today apparently they had a hard time getting it to stop bleeding that had a pressure dressing on pretty tightly the good news is hemostasis has been achieved. With that being said the patient tells me that he unfortunately when they unwrapped him had several areas that were somewhat pussy and opened in regard to his leg I think he may have a bit of an infection ensuing again already. 08-22-2023 upon evaluation today patient's leg appears to be somewhat better in regards to the infection but unfortunately is still having some issues here with the wound the good news is [name] report did come to me and the patient did have a staph infection but otherwise did not have any signs of cancer pathology. 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 08-29-2023 upon evaluation today patient's leg is actually showing signs of significant improvement. Fortunately I see no evidence of worsening overall and I do believe that he is making good headway here towards closure. 09-19-2023 upon evaluation today patient's wound is actually showing signs of excellent improvement and seems to be making really good headway here towards closure. I am actually very pleased with where we stand. I do not see any signs of active infection at this time. 09-26-2023 upon  evaluation today patient appears to be still having issues with this lateral leg ulcer he has 2 new places that are showing up as well on the lateral leg on the left. 1 a little bit higher up 1 just below. With that being said unfortunately this has continued to be an ongoing issue for him he is obviously very frustrated today which I completely understand. 12/31; he has 2 areas on the left lateral leg proximal and distal. The proximal area looks like it is trying to epithelialize and places and the distal 1 is slightly smaller. We have been using Hydrofera Blue under Urgo take K2 compression's. Electronic Signature(s) Signed: 10/08/2023 4:49:51 PM By: Rufus Sharper MD Entered  By: Rufus Sharper on 10/08/2023 13:56:07 -------------------------------------------------------------------------------- Physical Exam Details Patient Name: Date of Service: [name] R. 10/08/2023 1:15 PM Medical Record Number: [id number] Patient Account Number: [id number] Date of Birth/Sex: Treating RN: March 13, 1946 (78 y.o. [name] Primary Care Provider: Gasper Golas Other Clinician: Referring Provider: Treating Provider/Extender: RO BSO LOISE, MICHA EL KANDICE Gasper Golas Devra in Treatment: 20 Constitutional Patient is hypertensive.. Pulse regular and within target range for patient.SABRA Respirations regular, non-labored and within target range.SABRA appears in no distress. Notes Wound exam; left lateral leg distal and proximal. Neither wound required debridement there is no evidence of surrounding infection. The more proximal area appears to be attempting to epithelialize. The distal area perhaps slightly smaller than last week. Both areas of bleed very freely, the patient is on Xarelto . No evidence of infection Electronic Signature(s) Signed: 10/08/2023 4:49:51 PM By: Rufus Sharper MD Entered By: Rufus Sharper on 10/08/2023 13:57:14 JONMICHAEL, BEADNELL R ([id number])  [id number] Page 5 of 11 -------------------------------------------------------------------------------- Physician Orders Details Patient Name: Date of Service: [name] R. 10/08/2023 1:15 PM Medical Record Number: [id number] Patient Account Number: [id number] Date of Birth/Sex: Treating RN: 10/12/1945 (78 y.o. [name] Primary Care Provider: Gasper Golas Other Clinician: Referring Provider: Treating Provider/Extender: RO BSO LOISE, MICHA EL KANDICE Gasper Golas Devra in Treatment: 20 The following information was scribed by: [name] The information was scribed for: RUFUS SHARPER KANDICE Verbal / Phone Orders: No Diagnosis Coding Follow-up Appointments Return Appointment in 1 week. Nurse Visit as needed Bathing/ Shower/ Hygiene May shower; gently cleanse wound with antibacterial soap, rinse and pat dry prior to dressing wounds Edema Control - Orders / Instructions UrgoK2 40mmhg Elevate, Exercise Daily and A void Standing for Long Periods of Time. Elevate leg(s) parallel to the floor when sitting. Non-Wound Condition dditional non-wound orders/instructions: - area to left proximal lower leg that had xeroform on it, pt states was there last visit, re-applied xerform A with dressing change, needs to be assessed and added to orders next provider visit. Wound Treatment Wound #[id number] - Lower Leg Wound Laterality: Left, Lateral, Distal Cleanser: Soap and Water 1 x Per Week/30 Days Discharge Instructions: Gently cleanse wound with antibacterial soap, rinse and pat dry prior to dressing wounds Cleanser: Wound Cleanser 1 x Per Week/30 Days Discharge Instructions: Wash your hands with soap and water. Remove old dressing, discard into plastic bag and place into trash. Cleanse the wound with Wound Cleanser prior to applying a clean dressing using gauze sponges, not tissues or cotton balls. Do not scrub or use excessive force. Pat dry using gauze sponges, not  tissue or cotton balls. Peri-Wound Care: Desitin Maximum Strength Ointment 4 (oz) 1 x Per Week/30 Days Discharge Instructions: Apply around wound edges to avoid maceration Topical: Triamcinolone Acetonide Cream, 0.1%, 15 (g) tube 1 x Per Week/30 Days Discharge Instructions: Apply to entire lower leg Topical: 1 x Per Week/30 Days Prim Dressing: Hydrofera Blue Ready Transfer Foam, 2.5x2.5 (in/in) 1 x Per Week/30 Days ary Discharge Instructions: Apply Hydrofera Blue Ready to wound bed as directed Secondary Dressing: Zetuvit Plus 4x8 (in/in) 1 x Per Week/30 Days Compression Wrap: Urgo K2, two layer compression system, regular 1 x Per Week/30 Days Electronic Signature(s) Signed: 10/08/2023 4:49:51 PM By: Rufus Sharper MD Signed: 10/08/2023 5:14:10 PM By: [name] Blossom MSN RN CNS WTA Entered By: [name] on 10/08/2023 14:07:10 TYREAK, REAGLE R ([id number]) [id number] Page 6 of 11 -------------------------------------------------------------------------------- Problem List Details Patient Name: Date of Service: [name]  [name], [name] 10/08/2023 1:15 PM Medical Record Number: [id number] Patient Account Number: [id number] Date of Birth/Sex: Treating RN: 1945/10/22 (78 y.o. 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 of 05/16/2023 No Yes bilateral lower extremity L97.822 Non-pressure chronic ulcer of other part of left lower leg with fat layer exposed8/05/2023 No Yes I73.89 Other specified peripheral vascular diseases 05/16/2023 No Yes I48.0 Paroxysmal atrial fibrillation 05/16/2023 No Yes I10 Essential (primary) hypertension  05/16/2023 No Yes Inactive Problems Resolved Problems Electronic Signature(s) Signed: 10/08/2023 4:49:51 PM By: Rufus Sharper MD Entered By: Rufus Sharper on 10/08/2023 13:55:19 JOESPH CANCER R ([id number]) [id number] Page 7 of 11 -------------------------------------------------------------------------------- Progress Note Details Patient Name: Date of Service: [name] ELWIN SAILOR DELENA [name] R. 10/08/2023 1:15 PM Medical Record Number: [id number] Patient Account Number: [id number] Date of Birth/Sex: Treating RN: Jul 24, 1946 (78 y.o. 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 the patient has had a vascular workup over 7 years ago and said everything was normal at that stage. He does not have any chronic problems except for cardiac issues which he sees a cardiologist in [place]. 08/15/2017 -- arterial and venous duplex studies still pending. 08/23/2017 -- venous reflux studies done on 08/13/2017 shows venous incompetence throughout the left lower extremity deep system and focally at the left saphenofemoral junction. 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 No evidence of SVT or DVT in bilateral lower extremities The patient has an appointment at the end of the month to get his arterial duplex study done 09/05/2017 -- the patient was seen at the vein and vascular office yesterday by Mariellen Iha. ABI studies were notable for medial calcification and the toe brachial indices were normal and bilateral ankle-brachial) waveforms were normal with triphasic flow. After review of his venous studies he was not a candidate for laser ablation and his lymphedema was to be treated with compression stockings and lymphedema pump pumps 09/12/2017 -- had a low arterial study done at the [facility] -- unable to obtain reliable ABI is due to medial calcification. Bilateral toe indices were normal with the right being 1.01 and the left being 0.92 and the waveforms were triphasic bilaterally. he did get hold of 30-40 mm compression stockings but is unable to put these on. We will try and get him alternative compression stockings. 09/26/17- he is here in follow up evaluation of a right lower extremity ulcer;he is compliant in wearing compression stocking; ulcer almost epithelialized , anticipate healing next appointment Readmission: 11/17 point upon evaluation patient's wound currently that he is seeing us  for today is a skin cancerous lesion that was cleared away by his dermatologist on the left medial calf region. He tells me that this is a very similar thing to what he had done previously in fact the last time  he saw him in 2018 this was also what was going on at that point. Nonetheless he feels that based on what he seeing currently that this is just having a lot of harder time healing although it is much closer to the surface than what he is experienced in the past. He notes that the initial removal was in June 2022 which was this year this is now November and still has not closed. He does have some edema and definitely I think that there is some venous component to his slow healing here. Also think that we can do something better than Vaseline to try to help with getting this to clear up as quickly as possible. He does have a history of atrial fibrillation and is on Eliquis  otherwise he really has no major medical problems that would affect wound healing. 09/07/2021 upon evaluation today patient actually appears to be doing significantly better after having wrapped him last week. Overall I think that this is making significant improvements at this time which is great news. I do not see any evidence of infection which is great news as well. No fevers, chills, nausea, vomiting, or diarrhea. 09/14/2021 upon evaluation today patient appears to be doing well with regard to his leg ulcer. He has been tolerating the dressing changes and overall I think that he is making excellent progress. I do not see any signs of active infection at this time. 09/21/2021 upon evaluation today patient actually appears to be making good progress with regard to his wound this is again measuring smaller today no debridement seems to be necessary. We have been using a silver collagen dressing and I think that is doing an awesome job. 09/28/2021 upon evaluation today patient appears to be doing well with regard to his leg currently. I do not see any signs of active infection at this time which is great news. No fevers, chills, nausea, vomiting, or diarrhea. I think this wound is very close to complete resolution. 10/12/2021 upon evaluation today patient actually appears to be doing awesome in regard to his leg ulcer. In fact this appears to be completely healed based on what I am seeing currently. I do not see any evidence of active infection locally nor systemically at this time which is also great news. No fevers, chills, nausea, vomiting, or diarrhea. Readmission: 12/07/2021 upon evaluation today patient presents for readmission here in the clinic. He was discharged on 10/12/2021 is completely healed. Unfortunately this has reopened at this point and he is having continual issues with new blisters over both lower extremities. This is even worse than what we previously saw. Nonetheless we did actually  check his ABIs today and it did reveal that his ABIs were 0.55 on the left and 0.57 on the right. Subsequently this is a definite change from his last arterial study which showed that he did have good blood flow at 1.01 on the right and 0.92 on the left and that was right at the beginning of 2019. Nonetheless based on what we see currently I do think he tolerated the 3 layer compression wrap but I do believe that we probably need to get him tested for his arterial flow in order to see where things stand and if there is something we can do there that would help prevent this from continue to be an ongoing issue. He did not utilize compression socks in the interim from when he was last here till this time. That something is probably going to need  lifelong going forward as well. 3/9; patient presents for follow-up. He has no issues or complaints today. He tolerated the compression wrap well. He had ABIs with TBI's done. He denies signs of infection. 12/21/2021 upon evaluation today patient appears to be doing well with regard to the wounds on his legs. Both are showing signs of significant improvement which is great news although I do believe some sharp debridement would be of benefit here as well. 12/28/2021 upon evaluation today patient appears to be doing well with regard to his wounds. Everything is showing signs of excellent improvement which I am very pleased about. I think that we are headed in the right direction here. Fortunately there does not appear to be any evidence of infection which is great news there is a little bit of hypergranulation. 01/04/2022 upon evaluation today patient appears to be doing well with regard to his wounds 2 of them are healed 1 is almost so and the other 1 is significantly better. Overall I am extremely pleased with where we stand and I think that he is making excellent progress here. I do not see any evidence of active infection locally nor systemically at this  time. 01-16-2022 upon evaluation today patient's wound on the left leg is showing signs of doing quite well. Has not completely cleared at this point but it is much improved. Fortunately I do not see any signs of infection at this time. No fevers, chills, nausea, vomiting, or diarrhea. NAZARETH, KIRK R ([id number]) [id number] Page 8 of 11 01-23-2022 upon evaluation today patient's wound of the left leg actually appears to be pretty much completely healed which is great news. I do not see any signs of active infection locally or systemically which is excellent. With that being said on the right leg what wound is measuring smaller the other 1 is a new wound that just showed up fortunately its not too bad. Has been using Xeroform here and that seems to be doing decently well which is great news. Unfortunately his blood pressure is significantly high we gave him the readings for the past 4-5 visits as well as a recommendation to make an appointment to go discuss this with his primary care provider patient states that he is going to look into doing this. 01-30-2022 upon evaluation today patient appears to be doing well with regard to his left leg everything appears to be healed. On the right leg the more anterior wound is healed the more medial wound that I been concerned about a possible skin cancer unfortunately still does not look great to me. I do believe that we should probably do a biopsy I have talked about it with him a few times I think though it is probably time to go ahead and do this at this point. 02-09-2022 upon evaluation today patient appears to be doing well with regard to his legs. On the left this appears to be completely healed. On the right he does have 2 areas and be perfectly honest one of them is a skin cancer that he is going to the Mohs surgery clinic for the other seems to be healing nicely. Readmission: 08-02-2022 upon evaluation today patient appears for  reevaluation here in our clinic concerning issues that he has been having with wounds over the bilateral lower extremities. I last saw him in May 2023 and at that point we had him completely healed. Unfortunately he is tells me this has broken down to some degree since that point. 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 He tells me this 1 seems to just be getting bigger and bigger not improving. Fortunately he is not having any significant pain which is good news he does have quite a bit of swelling and he tells me that his fluid pills are not recommended for him to take daily but just in 3-day intervals here and there. 08-09-2022 upon evaluation today patient appears to be doing still somewhat poorly in regard to his legs although in general he does not appear to be feeling as good as he has been. Fortunately there does not appear to be any signs of infection which is good news. With that being said he is having some issues here with having and overall poor feeling in general which again is good I think going to be the biggest complicating factor. He actually seems to be coughing I do not hear any wheezing right now I did listen to his chest he did not have good airflow down low however makes me suspicious for bronchitis or even possibly pneumonia which could be part of what is going on here as well. Fortunately I do not see any evidence of anything worsening in regard to his legs but I definitely believe that he needs to continue with the compression wraps he took them off yesterday to shower has not had anything on for 24 hours that is why his legs are so swollen today. With regard to his pathology report I did review that it showed some squamous abnormality but no signs of distinct carcinoma. With  that being said it was saying that it could be adjacent to a squamous cell carcinoma nonetheless my suggestion is can be that we have the patient take copy of this report and give it to his Mohs surgeon in order for them to see if there is anything they feel like needs to be done further. With that being said right now I feel like the primary thing is going to be for us  to try to get his swelling down and keep that down into that hand since he is having so much drainage I believe we can have to bring him in for dressing changes twice a week doing a nurse visit on Mondays. 11/9; since the patient was last here he spent the night in the emergency room he received IV Lasix . Also received antibiotics although he was not discharged on either 1 of these. He also saw his cardiology office who put him on regular Lasix  20 mg [previously on as needed Lasix  20 mg]. Per our intake nurse the swelling in his legs is remarkably better but he still has bilateral lower extremity wounds. He still has wounds on the bilateral lower extremities most problematically on the left lateral calf. He has been using silver alginate under 3 layer compression. 08-23-2022 upon evaluation today patient appears to be doing much better than the last time I saw him 2 weeks ago. At that point I was very concerned about how he was doing he did see Dr. Gasper his primary care provider they got him on some blood pressure medication in general his color and overall appearance looks to be doing much improved compared to the last time I saw him. 09-04-2022 upon evaluation today patient appears to be  doing well currently in regard to his wounds. Everything is showing signs of improvement which is great news. Fortunately there does not appear to be any signs of active infection locally or systemically at this time. No fevers, chills, nausea, vomiting, or diarrhea. 09-10-2022 upon evaluation today patient appears to be doing better in regard to  his wounds although the [facility] was extremely stuck to the wound bed. Fortunately there does not appear to be any signs of infection locally or systemically at this time which is great news. No fevers, chills, nausea, vomiting, or diarrhea. 09-17-2022 upon evaluation today patient appears to be doing well currently in regard to his wounds in general. The right leg actually showing signs of excellent improvement and very pleased with where things stand in that regard. Fortunately I do not see any evidence of infection locally or systemically at this time which is great news. No fevers, chills, nausea, vomiting, or diarrhea. 09-24-2022 upon evaluation today patient appears to be doing well currently in regard to his wounds. Things look to be doing quite well. With that being said he did have a result unfortunately on the pathology which showed that he did have a squamous cell carcinoma noted on the biopsy sample I sent last week. He is seeing his dermatologist tomorrow in that regard. With that being said other than that however he seems to really be making some pretty good progress here which is good news. No fevers, chills, nausea, vomiting, or diarrhea. 12/26; the patient has 2 open wounds remaining on the left leg. One is on the left anterior mid tibia and the other is on the right lateral knee just outside of the popliteal fossa. 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 The patient has been to see dermatology [name] who apparently is making him a referral to the [facility] center. He does not yet have an appointment 10-12-2022 upon evaluation today patient appears to be doing well currently in regard to his wound. He has been tolerating the dressing changes without complication and overall feel like we are headed in the right direction. Fortunately I do not see any signs of infection locally or systemically at this time which is great  news. No fevers, chills, nausea, vomiting, or diarrhea. 10-23-2022 upon evaluation today patient appears to be doing well currently in regard to his wound. He has been tolerating the dressing changes without complication. Fortunately there does not appear to be any signs of active infection locally nor systemically which is great news and overall I am extremely pleased with where we stand currently. No fevers, chills, nausea, vomiting, or diarrhea. 10-26-2022 upon evaluation today patient appears to be doing well currently in regard to his wounds. Everything is showing signs of improvement and this is great news. Fortunately I see no evidence of active infection systemically. He does seem to be doing much better in regard to the local infection in regard to his leg. The smell is also greatly improved. Overall I am extremely happy with where we stand today. This is after just a few days with the antibiotic on board. 11-05-2022 upon evaluation today patient appears to be doing well currently in regard to his wounds although the wound where they performed the Mohs surgery does look a little bit hyper granulated I think switching to Hydrofera Blue may be better for him. He voiced understanding. Fortunately there does not appear to be any evidence of active infection locally nor systemically  at this time. 11-12-2022 upon evaluation today patient appears to be doing better in regard to both wounds he has been tolerating the dressing changes without complication. There is no signs of infection and in general I think you are doing quite well. No fevers, chills, nausea, vomiting, or diarrhea. 11-20-2022 upon evaluation today patient appears to be doing well currently in regard to his wounds. He has been tolerating the dressing changes without complication. Fortunately there does not appear to be any signs of infection at this time. No fevers, chills, nausea, vomiting, or diarrhea. 11-27-2022 upon evaluation today  patient appears to be doing somewhat poorly in regard to his leg in general he has a lot of areas where he looks like he had some spots that popped up. There with regard to new possible blisters. In general I am actually very concerned about the fact that the wrap may be causing ARLAND, USERY R ([id number]) [id number] Page 9 of 11 some irritation here. I think that we can try to not do the wrap for 1 week, and given the prescription for mupirocin ointment which I will send into the pharmacy for him. 12-04-2022 upon evaluation today patient appears to be doing well currently in regard to his wounds in fact he appears to be pretty much completely healed based on what I am seeing at this point. I do not see any signs of active infection locally nor systemically at this time which is great news. No fevers, chills, nausea, vomiting, or diarrhea. 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 I discussed that with him today he is aware of the situation.-2024 upon evaluation patient presents for readmission here in the clinic actually last saw him December 04, 2022. With that being said previously his dermatologist had asked that if there was something the need to be addressed from a dermatology standpoint and specifically biopsy that we make referral back to them to allow them to do it which I am definitely happy to do. 01-31-2023 upon evaluation today patient appears to be doing well currently in regard to his wound in fact this looks better he did see [name] his dermatologist yesterday and they did perform a biopsy. With that being said he actually looks like he is doing much better [name] feels like this may not be a cancerous lesion which will be very good news at the same time I definitely wanted to make sure  especially considering his history and the way this looks when we saw him last week. Nonetheless I am extremely pleased with the fact that he is doing so much better at this point. 02-07-2023 upon evaluation today patient appears to be doing well currently in regard to his wound from the standpoint of size it has not gotten any larger. With that being said he does have some issues here still with what appears to be potentially some infection. Wound does not look quite as good as it did last week. We are still waiting on the results for the pathology. I did put a call into dermatology but I have not heard anything back from them as of yet. 02-14-2023 upon evaluation today patient appears to be doing well with regard to his wound infection is definitely under control and looks much better. Fortunately I do not see any signs of systemic infection with  anyone locally I think this is improved greatly. 02-19-2023 upon evaluation today patient appears to be doing well currently in regard to his wound which is actually measuring smaller and looking much better. Fortunately I do not see any evidence of active infection locally nor systemically which is great news and overall I am extremely pleased with where we stand today. 02-26-2023 upon evaluation today patient appears to be doing well currently in regard to his wound. He is actually been tolerating the dressing changes without complication. Fortunately there does not appear to be any signs of active infection locally nor systemically which is great news and overall I am extremely pleased with where we stand currently. 03-12-2023 upon evaluation today patient's wound actually showed signs of excellent improvement. I am very pleased with where we stand I do believe that we are making good progress here. I do not see any signs of active infection. 03-19-2023 upon evaluation today patient actually appears to be making excellent progress in regard to his leg and getting  this closed. In fact is just a very small area that is actually open at this point. I am actually very pleased with what we are seeing today. 03-26-2023 upon evaluation today patient appears to be doing well currently in regard to his leg which is actually showing signs of being completely healed. Fortunately I do not see any signs of active infection locally or systemically which is great news and in general I do believe that we are moving in the right direction here. Readmission: 05-16-2023 upon evaluation today patient presents for reevaluation here in the clinic concerning a wound on the left anterior/lateral lower extremity. This is similar to where the wound was last time I saw him but not exactly the same. Fortunately there does not appear to be any signs of active infection at this time which is great news. The patient's past medical history really has not changed significantly since last time I saw him. 05-24-2023 upon evaluation patient actually appears to be doing excellent in regard to his leg ulcer. He has been tolerating the dressing changes without complication in general I do feel like there were making excellent headway towards complete closure. I do not see any signs of active infection at this point. 8/29; left lateral lower leg in the setting of chronic venous insufficiency. We have been using Hydrofera Blue under Urgo K2. Wounds are making nice improvements 06-20-2023 upon evaluation today patient appears to be doing well currently in regard to his leg ulcer. This is actually showing signs of being significantly smaller compared even last week's nurse visit this looks much better. Fortunately I do not see any need for sharp debridement he seems to be doing quite well. 06-27-2023 upon evaluation today patient appears to be doing well currently in regard to his wound. He is tolerating the dressing changes without complication. There is a little bit of hypergranulation on the use of  silver nitrate today to help keep this under control. Fortunately I do not see any evidence of active infection locally or systemically which is great news. 07-04-2023 upon evaluation today patient appears to be doing well currently in regard to his wound. He has been tolerating the dressing changes without complication. Fortunately I do not see any signs of active infection locally or systemically which is great news and in general I do believe that he is making really good headway towards complete closure. This looks much better after starting the antibiotics. 07-09-2023 upon evaluation today patient appears  to be doing excellent in regard to his leg which is actually significantly improved. I am very pleased with where we stand and I do believe that he is making really good headway towards getting this closed in fact it pretty much appears to be almost closed today there is just a very tiny area that I think needs 1 week to toughen up before we cut him loose completely. 07-16-2023 upon evaluation today patient appears to be doing well currently in regard to his wounds. In fact everything is completely healed and looks to be doing great. Fortunately I do not see any signs of active infection at this time which is great news. No fevers, chills, nausea, vomiting, or diarrhea. 07-30-2023 upon evaluation today patient unfortunately has a breakdown of the wound on the lateral aspect of his leg. Unfortunately I am a little concerned about the overall appearance of the leg I feel like he may require a biopsy to further evaluate the situation. I discussed that with the patient today. With that being said I previously been asked by [name] not to perform any of the biopsies myself but to let them see it prior to the biopsy. For that reason I would go ahead and see about making referral to [name] office to get this scheduled as quickly as possible is my hope. We reached out to them have not heard back as  of yet. 08-08-2023 upon evaluation today patient's wound actually showed signs of doing really about the same with regard to his wound. There does not appear to be any signs of active infection at this time which is good news with that being said they did not biopsy this at the office today when he went to dermatology. This is unfortunate as I thought that we can do that now. I will get a try to see if we get in touch with [name] office if were not able to get this done before I see him next week, going to just do the biopsy myself just to make sure we know what we are dealing with patient voiced understanding he is in agreement with the plan. 08-15-2023 upon evaluation today patient appears to be doing pretty well currently in regard to his wound which is okay he did have a biopsy today apparently they had a hard time getting it to stop bleeding that had a pressure dressing on pretty tightly the good news is hemostasis has been achieved. With that being said the patient tells me that he unfortunately when they unwrapped him had several areas that were somewhat pussy and opened in regard to his leg I think he may have a bit of an infection ensuing again already. 08-22-2023 upon evaluation today patient's leg appears to be somewhat better in regards to the infection but unfortunately is still having some issues here with COREE, BRAME ([id number]) [id number] Page 10 of 11 the wound the good news is [name] report did come to me and the patient did have a staph infection but otherwise did not have any signs of cancer pathology. 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 08-29-2023 upon evaluation today patient's leg is actually showing signs of significant improvement. Fortunately I see no evidence of worsening overall and I do believe that he is making good headway here towards closure. 09-19-2023 upon evaluation today patient's  wound is actually showing signs of excellent improvement and seems to be making really good headway here towards closure.  I am actually very pleased with where we stand. I do not see any signs of active infection at this time. 09-26-2023 upon evaluation today patient appears to be still having issues with this lateral leg ulcer he has 2 new places that are showing up as well on the lateral leg on the left. 1 a little bit higher up 1 just below. With that being said unfortunately this has continued to be an ongoing issue for him he is obviously very frustrated today which I completely understand. 12/31; he has 2 areas on the left lateral leg proximal and distal. The proximal area looks like it is trying to epithelialize and places and the distal 1 is slightly smaller. We have been using Hydrofera Blue under Urgo take K2 compression's. Objective Constitutional Patient is hypertensive.. Pulse regular and within target range for patient.SABRA Respirations regular, non-labored and within target range.SABRA appears in no distress. Vitals Time Taken: 1:19 PM, Height: 75 in, Weight: 220 lbs, BMI: 27.5, Temperature: 98.0 F, Pulse: 50 bpm, Respiratory Rate: 18 breaths/min, Blood Pressure: 161/69 mmHg. General Notes: Wound exam; left lateral leg distal and proximal. Neither wound required debridement there is no evidence of surrounding infection. The more proximal area appears to be attempting to epithelialize. The distal area perhaps slightly smaller than last week. Both areas of bleed very freely, the patient is on Xarelto . No evidence of infection Integumentary (Hair, Skin) Wound #21R status is Open. Original cause of wound was Gradually Appeared. The date acquired was: 04/08/2023. The wound has been in treatment 20 weeks. The wound is located on the Left,Distal,Lateral Lower Leg. The wound measures 3cm length x 1.5cm width x 0.1cm depth; 3.534cm^2 area and 0.353cm^3 volume. There is a none present amount of  drainage noted. Wound #22 status is Open. Original cause of wound was Gradually Appeared. The date acquired was: 09/09/2023. The wound is located on the Left,Proximal,Lateral Lower Leg. The wound measures 3.5cm length x 3.5cm width x 0.1cm depth; 9.621cm^2 area and 0.962cm^3 volume. There is no tunneling or undermining noted. There is a medium amount of sanguinous drainage noted. There is medium (34-66%) red granulation within the wound bed. There is no necrotic tissue within the wound bed. 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 There was a Four Layer Compression Therapy Procedure by [name] Blossom, RN. Post procedure Diagnosis Wound #21R: Same as Pre-Procedure Wound #22 Pre-procedure diagnosis of Wound #22 is a Venous Leg Ulcer located on the Left,Proximal,Lateral Lower Leg . There was a Four Layer Compression Therapy Procedure by [name] Blossom, RN. Post procedure Diagnosis Wound #22: Same as Pre-Procedure Plan GRANVIL, DJORDJEVIC ([id number]) [id number] Page 11 of 11 1. I could not see a reason to change the primary dressing at this point. The patient expressed frustration that things are not getting better more quickly I did not have an obvious explanation other than the severity of his venous hypertension 2. Hydrofera Blue under Urgo K2 to continue 3. He has been worked up a with a biopsy at dermatology this was negative for an underlying additional pathology. Electronic Signature(s) Signed: 10/08/2023 4:49:51 PM By: Rufus Sharper MD Entered By: Rufus Sharper on 10/08/2023 13:58:01 -------------------------------------------------------------------------------- SuperBill  Details Patient Name: Date of Service: [name] FABIENE 10/08/2023 Medical Record Number: [id number] Patient Account Number: [id number] Date of Birth/Sex: Treating RN: 1946/07/30 (78 y.o. [name] Primary Care Provider: Gasper Golas Other Clinician: Referring Provider: Treating Provider/Extender: CLAUDIE ARVELLA LOISE, MICHA EL KANDICE Gasper Golas Devra in Treatment: 20 Diagnosis Coding ICD-10 Codes Code Description I89.0 Lymphedema, not elsewhere classified 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 L97.822 Non-pressure chronic ulcer of other part of left lower leg with fat layer exposed I73.89 Other specified peripheral vascular diseases I48.0 Paroxysmal atrial fibrillation I10 Essential (primary) hypertension Facility Procedures : CPT4 Code: 63899838 Description: (Facility Use Only) 29581LT - APPLY MULTLAY COMPRS LWR LT LEG Modifier: Quantity: 1 Physician Procedures : CPT4 Code Description Modifier [id number] - WC PHYS LEVEL 3 - EST PT ICD-10 Diagnosis Description L97.822 Non-pressure chronic ulcer of other part of left lower leg with fat layer exposed I87.333 Chronic venous hypertension (idiopathic) with ulcer  and inflammation of bilateral lower extremity I89.0 Lymphedema, not elsewhere classified Quantity: 1 Electronic Signature(s) Signed: 10/08/2023 4:49:51 PM By: Rufus Sharper MD Signed: 10/08/2023 5:14:10 PM By: [name] Blossom MSN RN CNS WTA Entered By: [name] on 10/08/2023 14:07:49

## 2023-10-09 NOTE — Progress Notes (Signed)
 BERTHOLD, GLACE R ([id number]) [id number] Page 1 of 9 Visit Report for 10/08/2023 Arrival Information Details Patient Name: Date of Service: MO [name] DELENA WARD FABIENE 10/08/2023 1:15 PM Medical Record Number: [id number] Patient Account Number: [id number] Date of Birth/Sex: Treating RN: 1946-03-19 (78 y.o. [name] Primary Care Anahy Esh: Gasper Golas Other Clinician: Referring Lakeithia Rasor: Treating Ilyaas Musto/Extender: CLAUDIE ARVELLA LOISE, MICHA EL KANDICE Gasper Golas Devra in Treatment: 20 Visit Information History Since Last Visit Added or deleted any medications: No Patient Arrived: Ambulatory Any new allergies or adverse reactions: No Arrival Time: 13:13 Had a fall or experienced change in No Accompanied By: self activities of daily living that may affect Transfer Assistance: None risk of falls: Patient Identification Verified: Yes Signs or symptoms of abuse/neglect since last visito No Secondary Verification Process Completed: Yes Hospitalized since last visit: No Patient Requires Transmission-Based Precautions: No Has Dressing in Place as Prescribed: Yes Patient Has Alerts: No Has Compression in Place as Prescribed: Yes Pain Present Now: No Electronic Signature(s) Signed: 10/08/2023 5:14:10 PM By: [name] MSN RN CNS WTA Entered By: [name] on 10/08/2023 13:19:01 -------------------------------------------------------------------------------- Compression Therapy Details Patient Name: Date of Service: MO [name], [name] 10/08/2023 1:15 PM Medical Record Number: [id number] Patient Account Number: [id number] Date of Birth/Sex: Treating RN: Mar 13, 1946 (78 y.o. [name] Primary Care Keari Miu: Gasper Golas Other Clinician: Referring Mical Brun: Treating Franciscojavier Wronski/Extender: RO BSO LOISE, MICHA EL KANDICE Gasper Golas Devra in Treatment: 20 Compression Therapy Performed for Wound Assessment: Wound #21R Left,Distal,Lateral Lower Leg Performed By: Clinician  [name], RN Compression Type: Four Layer Post Procedure Diagnosis Same as Pre-procedure Electronic Signature(s) Signed: 10/08/2023 5:14:10 PM By: [name] MSN RN CNS WTA Entered By: [name] on 10/08/2023 13:54:16 JOESPH CANCER R ([id number]) [id number] Page 2 of 9 -------------------------------------------------------------------------------- Compression Therapy Details Patient Name: Date of Service: MO [name] DELENA WARD R. 10/08/2023 1:15 PM Medical Record Number: [id number] Patient Account Number: [id number] Date of Birth/Sex: Treating RN: 12-20-1945 (78 y.o. [name] Primary Care Avaeh Ewer: Gasper Golas Other Clinician: Referring Roczen Waymire: Treating Felicity Penix/Extender: RO BSO LOISE, MICHA EL KANDICE Gasper Golas Devra in Treatment: 20 Compression Therapy Performed for Wound Assessment: Wound #22 Left,Proximal,Lateral Lower Leg Performed By: Clinician [name], RN Compression Type: Four Layer Post Procedure Diagnosis Same as Pre-procedure Electronic Signature(s) Signed: 10/08/2023 5:14:10 PM By: [name] MSN RN CNS WTA Entered By: [name] on 10/08/2023 13:54:17 -------------------------------------------------------------------------------- Encounter Discharge Information Details Patient Name: Date of Service: MO [name], [name] 10/08/2023 1:15 PM Medical Record Number: [id number] Patient Account Number: [id number] Date of Birth/Sex: Treating RN: [birth year] (78 y.o. [name] Primary Care Byard Carranza: Gasper Golas Other Clinician: Referring Shatima Zalar: Treating Karna Abed/Extender: RO BSO LOISE, MICHA EL KANDICE Gasper Golas Devra in Treatment: 20 Encounter Discharge Information Items Discharge Condition: Stable Ambulatory Status: Ambulatory Discharge Destination: Home Transportation: Private Auto Accompanied By: self Schedule Follow-up Appointment: Yes Clinical Summary of Care: Electronic Signature(s) Signed: 10/08/2023  5:14:10 PM By: [name] MSN RN CNS WTA Entered By: [name] on 10/08/2023 14:09:16 BERNELL, SIGAL R ([id number]) [id number] Page 3 of 9 -------------------------------------------------------------------------------- Lower Extremity Assessment Details Patient Name: Date of Service: MO [name] DELENA WARD R. 10/08/2023 1:15 PM Medical Record Number: [id number] Patient Account Number: [id number] Date of Birth/Sex: Treating RN: [birth year] (78 y.o. [name] Primary Care Trentyn Boisclair: Gasper Golas Other Clinician: Referring Elektra Wartman: Treating Hamzeh Tall/Extender: RO BSO LOISE, MICHA EL KANDICE Gasper Golas Devra in Treatment: 20 Edema Assessment Assessed: [Left: No] [Right:  No] [Left: Edema] [Right: :] Calf Left: Right: Point of Measurement: 37 cm From Medial Instep 35 cm Ankle Left: Right: Point of Measurement: 12 cm From Medial Instep 23.3 cm Vascular Assessment Pulses: Dorsalis Pedis Palpable: [Left:Yes] Extremity colors, hair growth, and conditions: Extremity Color: [Left:Hyperpigmented] Hair Growth on Extremity: [Left:Yes] Temperature of Extremity: [Left:Warm] Capillary Refill: [Left:< 3 seconds] Dependent Rubor: [Left:No] Blanched when Elevated: [Left:No No] Toe Nail Assessment Left: Right: Thick: No Discolored: No Deformed: No Improper Length and Hygiene: No Electronic Signature(s) Signed: 10/08/2023 5:14:10 PM By: [name] MSN RN CNS WTA Entered By: [name] on 10/08/2023 13:35:53 JOESPH CANCER R ([id number]) [id number] Page 4 of 9 -------------------------------------------------------------------------------- Multi Wound Chart Details Patient Name: Date of Service: MO [name] DELENA WARD FABIENE 10/08/2023 1:15 PM Medical Record Number: [id number] Patient Account Number: [id number] Date of Birth/Sex: Treating RN: 10-16-1945 (78 y.o. [name] Primary Care Royale Swamy: Gasper Golas Other Clinician: Referring  Kao Berkheimer: Treating Ayris Carano/Extender: CLAUDIE ARVELLA LOISE, MICHA EL KANDICE Gasper Golas Devra in Treatment: 20 Vital Signs Height(in): 75 Pulse(bpm): 50 Weight(lbs): 220 Blood Pressure(mmHg): 161/69 Body Mass Index(BMI): 27.5 Temperature(F): 98.0 Respiratory Rate(breaths/min): 18 [21R:Photos:] [[name]:[name]] Left, Distal, Lateral Lower Leg Left, Proximal, Lateral Lower Leg [name] Wound Location: Gradually Appeared Gradually Appeared [name] Wounding Event: Vasculitis Venous Leg Ulcer [name] Primary Etiology: Arrhythmia, Hypertension, Gout Arrhythmia, Hypertension, Gout [name] Comorbid History: 04/08/2023 09/09/2023 [name] Date Acquired: 20 0 [name] Weeks of Treatment: Open Open [name] Wound Status: No No [name] Wound Recurrence: 3x1.5x0.1 3.5x3.5x0.1 [name] Measurements L x W x D (cm) 3.534 9.621 [name] A (cm) : rea 0.353 0.962 [name] Volume (cm) : 74.30% [name] [name] % Reduction in A rea: 74.30% [name] [name] % Reduction in Volume: Full Thickness Without Exposed Full Thickness Without Exposed [name] Classification: Support Structures Support Structures None Present Medium [name] Exudate A mount: [name] Sanguinous [name] Exudate Type: [name] red [name] Exudate Color: [name] Medium (34-66%) [name] Granulation A mount: [name] Red [name] Granulation Quality: [name] None Present (0%) [name] Necrotic A mount: [name] None [name] Epithelialization: Treatment Notes Electronic Signature(s) Signed: 10/08/2023 5:14:10 PM By: [name] MSN RN CNS WTA Entered By: [name] on 10/08/2023 13:36:02 -------------------------------------------------------------------------------- Multi-Disciplinary Care Plan Details Patient Name: Date of Service: [name], [name] 10/08/2023 1:15 PM Medical Record Number: [id number] Patient Account Number: [id number] Date of Birth/Sex: Treating RN: Aug 25, 1946 (78 y.o. Shaye, Lagace, Verona R ([id number]) [id number] Page 5 of 9 Primary Care Timmi Devora: Gasper Golas Other Clinician: Referring  Tessa Seaberry: Treating Leovanni Bjorkman/Extender: RO BSO N, MICHA EL KANDICE Gasper Golas Devra in Treatment: 20 Active Inactive Wound/Skin Impairment Nursing Diagnoses: Knowledge deficit related to ulceration/compromised skin integrity Goals: Patient/caregiver will verbalize understanding of skin care regimen Date Initiated: 05/16/2023 Target Resolution Date: 11/08/2023 Goal Status: Active Ulcer/skin breakdown will have a volume reduction of 30% by week 4 Date Initiated: 05/16/2023 Date Inactivated: 06/20/2023 Target Resolution Date: 06/16/2023 Goal Status: Met Ulcer/skin breakdown will have a volume reduction of 50% by week 8 Date Initiated: 05/16/2023 Date Inactivated: 07/17/2023 Target Resolution Date: 07/16/2023 Goal Status: Met Ulcer/skin breakdown will have a volume reduction of 80% by week 12 Date Initiated: 05/16/2023 Date Inactivated: 07/17/2023 Target Resolution Date: 08/16/2023 Goal Status: Met Ulcer/skin breakdown will heal within 14 weeks Date Initiated: 05/16/2023 Date Inactivated: 07/17/2023 Target Resolution Date: 09/15/2023 Goal Status: Met Interventions: Assess patient/caregiver ability to obtain necessary supplies Assess patient/caregiver ability to perform ulcer/skin care regimen upon admission and as needed Assess ulceration(s) every visit Notes: Electronic Signature(s) Signed: 10/08/2023 5:14:10 PM  By: [name] MSN RN CNS WTA Entered By: [name] on 10/08/2023 14:08:00 -------------------------------------------------------------------------------- Pain Assessment Details Patient Name: Date of Service: MO [name] DELENA WARD R. 10/08/2023 1:15 PM Medical Record Number: [id number] Patient Account Number: [id number] Date of Birth/Sex: Treating RN: 03/30/1946 (78 y.o. [name] Primary Care Charlis Harner: Gasper Golas Other Clinician: Referring Tiegan Jambor: Treating Lala Been/Extender: RO BSO LOISE, MICHA EL KANDICE Gasper Golas Devra in Treatment: 20 Active Problems Location of Pain  Severity and Description of Pain Patient Has Paino No Site Locations Blackburn, Osmond R ([id number]) [phone].pdf Page 6 of 9 Pain Management and Medication Current Pain Management: Electronic Signature(s) Signed: 10/08/2023 5:14:10 PM By: [name] MSN RN CNS WTA Entered By: [name] on 10/08/2023 13:20:38 -------------------------------------------------------------------------------- Patient/Caregiver Education Details Patient Name: Date of Service: MO [name] DELENA WARD FABIENE 12/31/2024andnbsp1:15 PM Medical Record Number: [id number] Patient Account Number: [id number] Date of Birth/Gender: Treating RN: Feb 07, 1946 (78 y.o. [name] Primary Care Physician: Gasper Golas Other Clinician: Referring Physician: Treating Physician/Extender: RO BSO LOISE, MICHA EL KANDICE Gasper Golas Devra in Treatment: 20 Education Assessment Education Provided To: Patient Education Topics Provided Wound/Skin Impairment: Handouts: Caring for Your Ulcer Methods: Explain/Verbal Responses: State content correctly Electronic Signature(s) Signed: 10/08/2023 5:14:10 PM By: [name] MSN RN CNS WTA Entered By: [name] on 10/08/2023 14:08:12 HAMEED, KOLAR R ([id number]) [id number] Page 7 of 9 -------------------------------------------------------------------------------- Wound Assessment Details Patient Name: Date of Service: MO [name] DELENA WARD FABIENE 10/08/2023 1:15 PM Medical Record Number: [id number] Patient Account Number: [id number] Date of Birth/Sex: Treating RN: 02/15/1946 (78 y.o. [name] Primary Care Aubria Vanecek: Gasper Golas Other Clinician: Referring Lizvet Chunn: Treating Jinna Weinman/Extender: CLAUDIE ARVELLA LOISE, MICHA EL KANDICE Gasper Golas Devra in Treatment: 20 Wound Status Wound Number: 21R Primary Etiology: Vasculitis Wound Location: Left, Distal, Lateral Lower Leg Wound Status: Open Wounding Event: Gradually Appeared Comorbid History:  Arrhythmia, Hypertension, Gout Date Acquired: 04/08/2023 Weeks Of Treatment: 20 Clustered Wound: No Photos Wound Measurements Length: (cm) 3 Width: (cm) 1.5 Depth: (cm) 0.1 Area: (cm) 3.534 Volume: (cm) 0.353 % Reduction in Area: 74.3% % Reduction in Volume: 74.3% Wound Description Classification: Full Thickness Without Exposed Support Exudate Amount: None Present Structures Treatment Notes Wound #21R (Lower Leg) Wound Laterality: Left, Lateral, Distal Cleanser Soap and Water Discharge Instruction: Gently cleanse wound with antibacterial soap, rinse and pat dry prior to dressing wounds Wound Cleanser Discharge Instruction: Wash your hands with soap and water. Remove old dressing, discard into plastic bag and place into trash. Cleanse the wound with Wound Cleanser prior to applying a clean dressing using gauze sponges, not tissues or cotton balls. Do not scrub or use excessive force. Pat dry using gauze sponges, not tissue or cotton balls. Peri-Wound Care Desitin Maximum Strength Ointment 4 (oz) Discharge Instruction: Apply around wound edges to avoid maceration Topical Triamcinolone Acetonide Cream, 0.1%, 15 (g) tube Discharge Instruction: Apply to entire lower leg ALEGANDRO, MACNAUGHTON R ([id number]) [phone].pdf Page 8 of 9 Primary Dressing Hydrofera Blue Ready Transfer Foam, 2.5x2.5 (in/in) Discharge Instruction: Apply Hydrofera Blue Ready to wound bed as directed Secondary Dressing Zetuvit Plus 4x8 (in/in) Secured With Compression Wrap Urgo K2, two layer compression system, regular Compression Stockings Add-Ons Electronic Signature(s) Signed: 10/08/2023 5:14:10 PM By: [name] MSN RN CNS WTA Entered By: [name] on 10/08/2023 13:33:54 -------------------------------------------------------------------------------- Wound Assessment Details Patient Name: Date of Service: MO [name], [name] 10/08/2023 1:15 PM Medical Record Number:  [id number] Patient Account Number: [id number] Date of Birth/Sex: Treating RN: July 07, 1946 (  78 y.o. [name] Primary Care Persephone Schriever: Gasper Golas Other Clinician: Referring Niall Illes: Treating Margretta Zamorano/Extender: CLAUDIE ARVELLA SAILOR, MICHA EL KANDICE Gasper Golas Devra in Treatment: 20 Wound Status Wound Number: 22 Primary Etiology: Venous Leg Ulcer Wound Location: Left, Proximal, Lateral Lower Leg Wound Status: Open Wounding Event: Gradually Appeared Comorbid History: Arrhythmia, Hypertension, Gout Date Acquired: 09/09/2023 Weeks Of Treatment: 0 Clustered Wound: No Photos Wound Measurements Length: (cm) 3.5 Width: (cm) 3.5 Depth: (cm) 0.1 Area: (cm) 9.621 Volume: (cm) 0.962 % Reduction in Area: % Reduction in Volume: Epithelialization: None Tunneling: No Undermining: No Wound Description Classification: Full Thickness Without Exposed Support Structures Exudate Amount: Medium ITZAEL, LIPTAK R ([id number]) Exudate Type: Sanguinous Exudate Color: red Foul Odor After Cleansing: No Slough/Fibrino No [id number] Page 9 of 9 Wound Bed Granulation Amount: Medium (34-66%) Exposed Structure Granulation Quality: Red Fascia Exposed: No Necrotic Amount: None Present (0%) Fat Layer (Subcutaneous Tissue) Exposed: No Tendon Exposed: No Muscle Exposed: No Joint Exposed: No Bone Exposed: No Treatment Notes Wound #22 (Lower Leg) Wound Laterality: Left, Lateral, Proximal Cleanser Peri-Wound Care Topical Primary Dressing Secondary Dressing Secured With Compression Wrap Compression Stockings Add-Ons Electronic Signature(s) Signed: 10/08/2023 5:14:10 PM By: [name] MSN RN CNS WTA Entered By: [name] on 10/08/2023 13:35:13 -------------------------------------------------------------------------------- Vitals Details Patient Name: Date of Service: MO ELWIN SAILOR, [name] 10/08/2023 1:15 PM Medical Record Number: [id number] Patient Account Number:  [id number] Date of Birth/Sex: Treating RN: 03-28-1946 (78 y.o. [name] Primary Care Desa Rech: Gasper Golas Other Clinician: Referring Shyam Dawson: Treating Luc Shammas/Extender: RO BSO SAILOR, MICHA EL KANDICE Gasper Golas Devra in Treatment: 20 Vital Signs Time Taken: 13:19 Temperature (F): 98.0 Height (in): 75 Pulse (bpm): 50 Weight (lbs): 220 Respiratory Rate (breaths/min): 18 Body Mass Index (BMI): 27.5 Blood Pressure (mmHg): 161/69 Reference Range: 80 - 120 mg / dl Electronic Signature(s) Signed: 10/08/2023 5:14:10 PM By: [name] MSN RN CNS WTA Entered By: [name] on 10/08/2023 13:20:32

## 2023-10-17 ENCOUNTER — Encounter: Payer: Medicare PPO | Attending: Physician Assistant | Admitting: Physician Assistant

## 2023-10-17 DIAGNOSIS — I87333 Chronic venous hypertension (idiopathic) with ulcer and inflammation of bilateral lower extremity: Secondary | ICD-10-CM | POA: Diagnosis not present

## 2023-10-17 DIAGNOSIS — L97822 Non-pressure chronic ulcer of other part of left lower leg with fat layer exposed: Secondary | ICD-10-CM | POA: Diagnosis not present

## 2023-10-17 DIAGNOSIS — I10 Essential (primary) hypertension: Secondary | ICD-10-CM | POA: Insufficient documentation

## 2023-10-17 DIAGNOSIS — I480 Paroxysmal atrial fibrillation: Secondary | ICD-10-CM | POA: Insufficient documentation

## 2023-10-17 DIAGNOSIS — I890 Lymphedema, not elsewhere classified: Secondary | ICD-10-CM | POA: Insufficient documentation

## 2023-10-17 DIAGNOSIS — I428 Other cardiomyopathies: Secondary | ICD-10-CM | POA: Diagnosis not present

## 2023-10-17 DIAGNOSIS — L958 Other vasculitis limited to the skin: Secondary | ICD-10-CM | POA: Diagnosis not present

## 2023-10-17 NOTE — Progress Notes (Signed)
 JERRY, CLYNE R ([id number]) [id number] Page 1 of 9 Visit Report for 10/17/2023 Arrival Information Details Patient Name: Date of Service: MO [name] DELENA WARD FABIENE 10/17/2023 12:15 PM Medical Record Number: [id number] Patient Account Number: [id number] Date of Birth/Sex: Treating RN: [birth year] (78 y.o. [name] Primary Care Braydin Aloi: Gasper Golas Other Clinician: Referring Shimeka Bacot: Treating Chae Oommen/Extender: Bethena Andre Gasper Golas Devra in Treatment: 22 Visit Information History Since Last Visit Added or deleted any medications: No Patient Arrived: Ambulatory Any new allergies or adverse reactions: No Arrival Time: 12:09 Had a fall or experienced change in No Accompanied By: self activities of daily living that may affect Transfer Assistance: None risk of falls: Patient Identification Verified: Yes Signs or symptoms of abuse/neglect since last visito No Secondary Verification Process Completed: Yes Hospitalized since last visit: No Patient Requires Transmission-Based Precautions: No Has Dressing in Place as Prescribed: Yes Patient Has Alerts: No Has Compression in Place as Prescribed: Yes Pain Present Now: No Electronic Signature(s) Signed: 10/17/2023 4:26:36 PM By: [name] MSN RN CNS WTA Entered By: [name] on 10/17/2023 12:11:01 -------------------------------------------------------------------------------- Compression Therapy Details Patient Name: Date of Service: MO [name], [name] 10/17/2023 12:15 PM Medical Record Number: [id number] Patient Account Number: [id number] Date of Birth/Sex: Treating RN: [birth year] (78 y.o. [name] Primary Care Almeta Geisel: Gasper Golas Other Clinician: Referring Krystie Leiter: Treating Ether Wolters/Extender: Bethena Andre Gasper Golas Devra in Treatment: 22 Compression Therapy Performed for Wound Assessment: Wound #21R Left,Distal,Lateral Lower Leg Performed By: Clinician [name],  RN Compression Type: Four Layer Post Procedure Diagnosis Same as Pre-procedure Electronic Signature(s) Signed: 10/17/2023 4:26:36 PM By: [name] MSN RN CNS WTA Entered By: [name] on 10/17/2023 13:05:55 JOESPH CANCER R ([id number]) [id number] Page 2 of 9 -------------------------------------------------------------------------------- Encounter Discharge Information Details Patient Name: Date of Service: MO [name] DELENA WARD FABIENE 10/17/2023 12:15 PM Medical Record Number: [id number] Patient Account Number: [id number] Date of Birth/Sex: Treating RN: 23-Jan-1946 (78 y.o. [name] Primary Care Suhaila Troiano: Gasper Golas Other Clinician: Referring Chevez Sambrano: Treating Ranbir Chew/Extender: Bethena Andre Gasper Golas Devra in Treatment: 22 Encounter Discharge Information Items Post Procedure Vitals Discharge Condition: Stable Temperature (F): 97.7 Ambulatory Status: Ambulatory Pulse (bpm): 40 Discharge Destination: Home Respiratory Rate (breaths/min): 18 Transportation: Private Auto Blood Pressure (mmHg): 169/72 Accompanied By: self Schedule Follow-up Appointment: Yes Clinical Summary of Care: Electronic Signature(s) Signed: 10/17/2023 1:58:48 PM By: [name] MSN RN CNS WTA Entered By: [name] on 10/17/2023 13:58:47 -------------------------------------------------------------------------------- Lower Extremity Assessment Details Patient Name: Date of Service: [name], [name] 10/17/2023 12:15 PM Medical Record Number: [id number] Patient Account Number: [id number] Date of Birth/Sex: Treating RN: [birth year] (78 y.o. [name] Primary Care Larico Dimock: Gasper Golas Other Clinician: Referring Maryanne Huneycutt: Treating Ronn Smolinsky/Extender: Bethena Andre Gasper Golas Devra in Treatment: 22 Edema Assessment Assessed: Colletta: No] Glenis: No] [Left: Edema] [Right: :] Calf Left: Right: Point of Measurement: 37 cm From Medial Instep 35 cm Ankle Left:  Right: Point of Measurement: 12 cm From Medial Instep 23.5 cm Vascular Assessment Left: [id number] Page 3 of 9Right:] Pulses: Dorsalis Pedis Palpable: [id number] Page 3 of 9Yes] Extremity colors, hair growth, and conditions: Extremity Color: [[id number] Page 3 of 9Hyperpigmented] Hair Growth on Extremity: [phone].pdf Page 3 of 9Yes] Temperature of Extremity: [phone].pdf Page 3 of 9Warm] Capillary Refill: [phone].pdf Page 3 of 9< 3 seconds] Dependent Rubor: [phone].pdf Page 3 of 9No] Blanched when Elevated: [phone].pdf Page 3 of 9No No] Toe Nail Assessment Left: Right: Thick: No Discolored: No Deformed: No Improper  Length and Hygiene: No Electronic Signature(s) Signed: 10/17/2023 4:26:36 PM By: [name] MSN RN CNS WTA Entered By: [name] on 10/17/2023 12:30:34 -------------------------------------------------------------------------------- Multi Wound Chart Details Patient Name: Date of Service: MO ELWIN SAILOR, [name] 10/17/2023 12:15 PM Medical Record Number: [id number] Patient Account Number: [id number] Date of Birth/Sex: Treating RN: 1946-02-15 (78 y.o. [name] Primary Care Afrika Brick: Gasper Golas Other Clinician: Referring Navjot Pilgrim: Treating Gram Siedlecki/Extender: Bethena Andre Gasper Golas Devra in Treatment: 22 Vital Signs Height(in): 75 Pulse(bpm): 40 Weight(lbs): 220 Blood Pressure(mmHg): 169/72 Body Mass Index(BMI): 27.5 Temperature(F): 97.7 Respiratory Rate(breaths/min): 18 [21R:Photos:] [[name]:[name]] Left, Distal, Lateral Lower Leg Left, Proximal, Lateral Lower Leg [name] Wound Location: Gradually Appeared Gradually Appeared [name] Wounding Event: Vasculitis Venous Leg Ulcer [name] Primary Etiology: Arrhythmia, Hypertension, Gout Arrhythmia,  Hypertension, Gout [name] Comorbid History: 04/08/2023 09/09/2023 [name] Date Acquired: 22 1 [name] Weeks of Treatment: Open Open [name] Wound Status: No No [name] Wound Recurrence: No Yes [name] Clustered Wound: 1.2x1x0.1 6x4x0.1 [name] Measurements L x W x D (cm) TAVI, HOOGENDOORN R ([id number]) [id number] Page 4 of 9 0.942 18.85 [name] A (cm) : rea 0.094 1.885 [name] Volume (cm) : 93.10% -95.90% [name] % Reduction in Area: 93.20% -95.90% [name] % Reduction in Volume: Full Thickness Without Exposed Full Thickness Without Exposed [name] Classification: Support Structures Support Structures None Present Medium [name] Exudate A mount: [name] Sanguinous [name] Exudate Type: [name] red [name] Exudate Color: [name] Medium (34-66%) [name] Granulation A mount: [name] Red [name] Granulation Quality: [name] None Present (0%) [name] Necrotic A mount: [name] None [name] Epithelialization: Treatment Notes Electronic Signature(s) Signed: 10/17/2023 4:26:36 PM By: [name] MSN RN CNS WTA Entered By: [name] on 10/17/2023 13:04:14 -------------------------------------------------------------------------------- Multi-Disciplinary Care Plan Details Patient Name: Date of Service: MO ELWIN SAILOR, [name] 10/17/2023 12:15 PM Medical Record Number: [id number] Patient Account Number: [id number] Date of Birth/Sex: Treating RN: 09-12-1946 (78 y.o. [name] Primary Care Alicyn Klann: Gasper Golas Other Clinician: Referring Elane Peabody: Treating Rosaire Cueto/Extender: Bethena Andre Gasper Golas Devra in Treatment: 22 Active Inactive Wound/Skin Impairment Nursing Diagnoses: Knowledge deficit related to ulceration/compromised skin integrity Goals: Patient/caregiver will verbalize understanding of skin care regimen Date Initiated: 05/16/2023 Target Resolution Date: 11/08/2023 Goal Status: Active Ulcer/skin breakdown will have a volume reduction of 30% by week 4 Date Initiated: 05/16/2023 Date Inactivated: 06/20/2023 Target Resolution  Date: 06/16/2023 Goal Status: Met Ulcer/skin breakdown will have a volume reduction of 50% by week 8 Date Initiated: 05/16/2023 Date Inactivated: 07/17/2023 Target Resolution Date: 07/16/2023 Goal Status: Met Ulcer/skin breakdown will have a volume reduction of 80% by week 12 Date Initiated: 05/16/2023 Date Inactivated: 07/17/2023 Target Resolution Date: 08/16/2023 Goal Status: Met Ulcer/skin breakdown will heal within 14 weeks Date Initiated: 05/16/2023 Date Inactivated: 07/17/2023 Target Resolution Date: 09/15/2023 Goal Status: Met Interventions: Assess patient/caregiver ability to obtain necessary supplies Assess patient/caregiver ability to perform ulcer/skin care regimen upon admission and as needed Assess ulceration(s) every visit Notes: JASH, WAHLEN ([id number]) [id number].pdf Page 5 of 9 Electronic Signature(s) Signed: 10/17/2023 1:57:39 PM By: [name] MSN RN CNS WTA Entered By: [name] on 10/17/2023 13:57:39 -------------------------------------------------------------------------------- Pain Assessment Details Patient Name: Date of Service: MO ELWIN SAILOR, [name] 10/17/2023 12:15 PM Medical Record Number: [id number] Patient Account Number: [id number] Date of Birth/Sex: Treating RN: [birth year] (79 y.o. [name] Primary Care Ankit Degregorio: Gasper Golas Other Clinician: Referring Jveon Pound: Treating Jahniya Duzan/Extender: Bethena Andre Gasper Golas Devra in Treatment: 22 Active Problems Location of Pain Severity and Description of Pain Patient Has Paino No Site  Locations Pain Management and Medication Current Pain Management: Electronic Signature(s) Signed: 10/17/2023 4:26:36 PM By: [name] MSN RN CNS WTA Entered By: [name] on 10/17/2023 12:27:29 -------------------------------------------------------------------------------- Patient/Caregiver Education Details Patient Name: Date of Service: MO [name] DELENA WARD R. 1/9/2025andnbsp12:15  PM [facility] [facility], [place] R ([id number]) [id number] Page 6 of 9 Medical Record Number: [id number] Patient Account Number: [id number] Date of Birth/Gender: Treating RN: [birth year] (79 y.o. [name] Primary Care Physician: Gasper Golas Other Clinician: Referring Physician: Treating Physician/Extender: Bethena Andre Gasper Golas Devra in Treatment: 22 Education Assessment Education Provided To: Patient Education Topics Provided Wound/Skin Impairment: Handouts: Caring for Your Ulcer Methods: Explain/Verbal Responses: State content correctly Electronic Signature(s) Signed: 10/17/2023 4:26:36 PM By: [name] MSN RN CNS WTA Entered By: [name] on 10/17/2023 13:57:51 -------------------------------------------------------------------------------- Wound Assessment Details Patient Name: Date of Service: MO [name], [name] 10/17/2023 12:15 PM Medical Record Number: [id number] Patient Account Number: [id number] Date of Birth/Sex: Treating RN: November 25, 1945 (78 y.o. [name] Primary Care Ariam Mol: Gasper Golas Other Clinician: Referring Arryana Tolleson: Treating Tahji Williams/Extender: Bethena Andre Gasper Golas Devra in Treatment: 22 Wound Status Wound Number: 21R Primary Etiology: Vasculitis Wound Location: Left, Distal, Lateral Lower Leg Wound Status: Open Wounding Event: Gradually Appeared Comorbid History: Arrhythmia, Hypertension, Gout Date Acquired: 04/08/2023 Weeks Of Treatment: 22 Clustered Wound: No Photos Wound Measurements Length: (cm) 1.2 Width: (cm) 1 Depth: (cm) 0.1 Area: (cm) 0.942 Volume: (cm) 0.094 Lemen, Albeiro R ([id number]) % Reduction in Area: 93.1% % Reduction in Volume: 93.2% [id number] Page 7 of 9 Wound Description Classification: Full Thickness Without Exposed Support Exudate Amount: None Present Structures Treatment Notes Wound #21R (Lower Leg) Wound Laterality: Left, Lateral,  Distal Cleanser Soap and Water Discharge Instruction: Gently cleanse wound with antibacterial soap, rinse and pat dry prior to dressing wounds Wound Cleanser Discharge Instruction: Wash your hands with soap and water. Remove old dressing, discard into plastic bag and place into trash. Cleanse the wound with Wound Cleanser prior to applying a clean dressing using gauze sponges, not tissues or cotton balls. Do not scrub or use excessive force. Pat dry using gauze sponges, not tissue or cotton balls. Peri-Wound Care AandD Ointment Discharge Instruction: Apply AandD Ointment as directed Topical Primary Dressing Silvercel Small 2x2 (in/in) Discharge Instruction: Apply Silvercel Small 2x2 (in/in) as instructed Secondary Dressing Zetuvit Plus 4x8 (in/in) Secured With Compression Wrap Urgo K2, two layer compression system, regular Compression Stockings Add-Ons Electronic Signature(s) Signed: 10/17/2023 4:26:36 PM By: [name] MSN RN CNS WTA Entered By: [name] on 10/17/2023 12:29:54 -------------------------------------------------------------------------------- Wound Assessment Details Patient Name: Date of Service: MO [name], [name] 10/17/2023 12:15 PM Medical Record Number: [id number] Patient Account Number: [id number] Date of Birth/Sex: Treating RN: 08-21-1946 (78 y.o. [name] Primary Care Darean Rote: Gasper Golas Other Clinician: Referring Myshawn Chiriboga: Treating Allani Reber/Extender: Bethena Andre Gasper Golas Devra in Treatment: 22 Wound Status Wound Number: 22 Primary Etiology: Venous Leg Ulcer Wound Location: Left, Proximal, Lateral Lower Leg Wound Status: Open Wounding Event: Gradually Appeared Comorbid History: Arrhythmia, Hypertension, Gout Date Acquired: 09/09/2023 Weeks Of Treatment: 1 Clustered Wound: Yes Photos CASTIN, DONAGHUE R ([id number]) [id number] Page 8 of 9 Wound Measurements Length: (cm) 6 Width: (cm) 4 Depth: (cm)  0.1 Area: (cm) 18.85 Volume: (cm) 1.885 % Reduction in Area: -95.9% % Reduction in Volume: -95.9% Epithelialization: None Wound Description Classification: Full Thickness Without Exposed Support Structures Exudate Amount: Medium Exudate Type: Sanguinous Exudate Color: red Foul Odor After Cleansing: No Slough/Fibrino No Wound Bed Granulation Amount:  Medium (34-66%) Exposed Structure Granulation Quality: Red Fascia Exposed: No Necrotic Amount: None Present (0%) Fat Layer (Subcutaneous Tissue) Exposed: No Tendon Exposed: No Muscle Exposed: No Joint Exposed: No Bone Exposed: No Treatment Notes Wound #22 (Lower Leg) Wound Laterality: Left, Lateral, Proximal Cleanser Soap and Water Discharge Instruction: Gently cleanse wound with antibacterial soap, rinse and pat dry prior to dressing wounds Wound Cleanser Discharge Instruction: Wash your hands with soap and water. Remove old dressing, discard into plastic bag and place into trash. Cleanse the wound with Wound Cleanser prior to applying a clean dressing using gauze sponges, not tissues or cotton balls. Do not scrub or use excessive force. Pat dry using gauze sponges, not tissue or cotton balls. Peri-Wound Care AandD Ointment Discharge Instruction: Apply AandD Ointment as directed Topical Primary Dressing Silvercel Small 2x2 (in/in) Discharge Instruction: Apply Silvercel Small 2x2 (in/in) as instructed Secondary Dressing Zetuvit Plus 4x8 (in/in) Secured With Compression Wrap Urgo K2, two layer compression system, regular Compression Stockings Add-Ons Electronic Signature(s) Signed: 10/17/2023 4:26:36 PM By: [name] MSN RN CNS WTA Entered By: [name] on 10/17/2023 12:29:35 JOESPH CLEM SAUNDERS ([id number]) [id number] Page 9 of 9 -------------------------------------------------------------------------------- Vitals Details Patient Name: Date of Service: MO [name] DELENA WARD R. 10/17/2023 12:15  PM Medical Record Number: [id number] Patient Account Number: [id number] Date of Birth/Sex: Treating RN: 07-24-1946 (78 y.o. [name] Primary Care Carleigh Buccieri: Gasper Golas Other Clinician: Referring Hillery Zachman: Treating Zenas Santa/Extender: Bethena Andre Gasper Golas Devra in Treatment: 22 Vital Signs Time Taken: 12:18 Temperature (F): 97.7 Height (in): 75 Pulse (bpm): 40 Weight (lbs): 220 Respiratory Rate (breaths/min): 18 Body Mass Index (BMI): 27.5 Blood Pressure (mmHg): 169/72 Reference Range: 80 - 120 mg / dl Electronic Signature(s) Signed: 10/17/2023 4:26:36 PM By: [name] MSN RN CNS WTA Entered By: [name] on 10/17/2023 12:27:23

## 2023-10-17 NOTE — Progress Notes (Addendum)
 [name] ([id number]) [id number] Page 1 of 14 Visit Report for 10/17/2023 Chief Complaint Document Details Patient Name: Date of Service: [name] 10/17/2023 12:15 PM Medical Record Number: [id number] Patient Account Number: [id number] Date of Birth/Sex: Treating RN: 1946/04/29 (78 y.o. [name] Primary Care Provider: Gasper Golas Other Clinician: Referring Provider: Treating Provider/Extender: [name] Gasper Golas Devra in Treatment: 22 Information Obtained from: Patient Chief Complaint Left LE Ulcer Electronic Signature(s) Signed: 10/17/2023 12:28:23 PM By: [name] Andre PA-C Entered By: [name] on 10/17/2023 12:28:23 -------------------------------------------------------------------------------- Debridement Details Patient Name: Date of Service: [name] [name], [name] 10/17/2023 12:15 PM Medical Record Number: [id number] Patient Account Number: [id number] Date of Birth/Sex: Treating RN: 1946-07-15 (78 y.o. [name] Primary Care Provider: Gasper Golas Other Clinician: Referring Provider: Treating Provider/Extender: [name] Gasper Golas Devra in Treatment: 22 Debridement Performed for Assessment: Wound #21R Left,Distal,Lateral Lower Leg Performed By: Physician [name] Andre, PA-C Debridement Type: Debridement Level of Consciousness (Pre-procedure): Awake and Alert Pre-procedure Verification/Time Out Yes - 13:04 Taken: Start Time: 13:04 Pain Control: Lidocaine  4% T opical Solution Percent of Wound Bed Debrided: 100% T Area Debrided (cm): otal 0.94 Tissue and other material debrided: Viable, Non-Viable, Slough, Subcutaneous, Slough Level: Skin/Subcutaneous Tissue Debridement Description: Excisional Instrument: Curette Bleeding: Moderate Hemostasis Achieved: Pressure Procedural Pain: 0 Post Procedural Pain: 0 Response to Treatment: Procedure was tolerated well [name] ([id number])  [id number] Page 2 of 14 Level of Consciousness (Post- Awake and Alert procedure): Post Debridement Measurements of Total Wound Length: (cm) 1.2 Width: (cm) 1 Depth: (cm) 0.1 Volume: (cm) 0.094 Character of Wound/Ulcer Post Debridement: Stable Post Procedure Diagnosis Same as Pre-procedure Electronic Signature(s) Signed: 10/17/2023 3:53:53 PM By: [name] Andre PA-C Signed: 10/17/2023 4:26:36 PM By: [name] Blossom MSN RN CNS WTA Entered By: [name] on 10/17/2023 13:05:35 -------------------------------------------------------------------------------- HPI Details Patient Name: Date of Service: [name], [name] 10/17/2023 12:15 PM Medical Record Number: [id number] Patient Account Number: [id number] Date of Birth/Sex: Treating RN: [birth year] (78 y.o. 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 the patient has had [name] vascular workup over 7 years ago and said everything was normal at that stage. He does not have any chronic problems except for cardiac issues which he sees [name] cardiologist in [place]. 08/15/2017 -- arterial and venous duplex studies still pending. 08/23/2017 -- venous reflux studies done on 08/13/2017 shows venous incompetence throughout the left lower extremity deep system and focally at the left saphenofemoral junction. 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 No evidence  of SVT or DVT in bilateral lower extremities The patient has an appointment at the end of the month to get his arterial duplex study done 09/05/2017 -- the patient was seen at the vein and vascular office yesterday by Mariellen Iha. ABI studies were notable for medial calcification and the toe brachial indices were normal and bilateral ankle-brachial) waveforms were normal with triphasic flow. After review of his venous studies he was not [name] candidate for laser ablation and his lymphedema was to be treated with compression stockings  and lymphedema pump pumps 09/12/2017 -- had [name] low arterial study done at the [facility] -- unable to obtain reliable ABI is due to medial calcification. Bilateral toe indices were normal with the right being 1.01 and the left being 0.92 and the waveforms were triphasic bilaterally. he did get hold of 30-40 mm compression stockings but is unable to put these on. We will try and get him alternative compression stockings. 09/26/17- he is here in follow up evaluation of [name] right lower extremity ulcer;he is compliant in wearing compression stocking; ulcer almost epithelialized , anticipate healing next appointment Readmission: 11/17 point upon evaluation patient's wound currently that he is seeing us  for today is [name] skin cancerous lesion that was cleared away by his dermatologist on the left medial calf region. He tells me that this is [name] very similar thing to what he had done previously in fact the last time he saw him in 2018 this was also what was going on at that point. Nonetheless he feels that based on what he seeing currently that this is just having [name] lot of harder time healing although it is much closer to the surface than what he is experienced in the past. He notes that the initial removal was in June 2022 which was this year this is now November and still has not closed. He does have some edema and definitely I think that there is some venous  component to his slow healing here. Also think that we can do something better than Vaseline to try to help with getting this to clear up as quickly as possible. He does have [name] history of atrial fibrillation and is on Eliquis otherwise he really has no major medical problems that would affect wound healing. 09/07/2021 upon evaluation today patient actually appears to be doing significantly better after having wrapped him last week. Overall I think that this is making significant improvements at this time which is great news. I do not see any evidence of infection which is great news as well. No fevers, chills, nausea, vomiting, or diarrhea. [name], [name] ([id number]) [id number] Page 3 of 14 09/14/2021 upon evaluation today patient appears to be doing well with regard to his leg ulcer. He has been tolerating the dressing changes and overall I think that he is making excellent progress. I do not see any signs of active infection at this time. 09/21/2021 upon evaluation today patient actually appears to be making good progress with regard to his wound this is again measuring smaller today no debridement seems to be necessary. We have been using [name] silver collagen dressing and I think that is doing an awesome job. 09/28/2021 upon evaluation today patient appears to be doing well with regard to his leg currently. I do not see any signs of active infection at this time which is great news. No fevers, chills, nausea, vomiting, or diarrhea. I think this wound is very close to complete resolution. 10/12/2021 upon evaluation today patient actually appears to be doing awesome in regard to his leg ulcer. In fact this appears to be completely healed based on what I am seeing currently. I do not see any evidence of active infection locally nor systemically at this time which is also great news. No fevers, chills, nausea, vomiting, or diarrhea. Readmission: 12/07/2021 upon evaluation today  patient presents for readmission here in the clinic. He was discharged on 10/12/2021 is completely healed. Unfortunately this has reopened at this point and he is having continual  issues with new blisters over both lower extremities. This is even worse than what we previously saw. Nonetheless we did actually check his ABIs today and it did reveal that his ABIs were 0.55 on the left and 0.57 on the right. Subsequently this is [name] definite change from his last arterial study which showed that he did have good blood flow at 1.01 on the right and 0.92 on the left and that was right at the beginning of 2019. Nonetheless based on what we see currently I do think he tolerated the 3 layer compression wrap but I do believe that we probably need to get him tested for his arterial flow in order to see where things stand and if there is something we can do there that would help prevent this from continue to be an ongoing issue. He did not utilize compression socks in the interim from when he was last here till this time. That something is probably going to need lifelong going forward as well. 3/9; patient presents for follow-up. He has no issues or complaints today. He tolerated the compression wrap well. He had ABIs with TBI's done. He denies signs of infection. 12/21/2021 upon evaluation today patient appears to be doing well with regard to the wounds on his legs. Both are showing signs of significant improvement which is great news although I do believe some sharp debridement would be of benefit here as well. 12/28/2021 upon evaluation today patient appears to be doing well with regard to his wounds. Everything is showing signs of excellent improvement which I am very pleased about. I think that we are headed in the right direction here. Fortunately there does not appear to be any evidence of infection which is great news there is [name] little bit of hypergranulation. 01/04/2022 upon evaluation today patient appears to  be doing well with regard to his wounds 2 of them are healed 1 is almost so and the other 1 is significantly better. Overall I am extremely pleased with where we stand and I think that he is making excellent progress here. I do not see any evidence of active infection locally nor systemically at this time. 01-16-2022 upon evaluation today patient's wound on the left leg is showing signs of doing quite well. Has not completely cleared at this point but it is much improved. Fortunately I do not see any signs of infection at this time. No fevers, chills, nausea, vomiting, or diarrhea. 01-23-2022 upon evaluation today patient's wound of the left leg actually appears to be pretty much completely healed which is great news. I do not see any signs of active infection locally or systemically which is excellent. With that being said on the right leg what wound is measuring smaller the other 1 is [name] new wound that just showed up fortunately its not too bad. Has been using Xeroform here and that seems to be doing decently well which is great news. Unfortunately his blood pressure is significantly high we gave him the readings for the past 4-5 visits as well as [name] recommendation to make an appointment to go discuss this with his primary care provider patient states that he is going to look into doing this. 01-30-2022 upon evaluation today patient appears to be doing well with regard to his left leg everything appears to be healed. On the right leg the more anterior wound is healed the more medial wound that I been concerned about [name] possible skin cancer unfortunately still does not look great to me.  I do believe that we should probably do [name] biopsy I have talked about it with him [name] few times I think though it is probably time to go ahead and do this at this point. 02-09-2022 upon evaluation today patient appears to be doing well with regard to his legs. On the left this appears to be completely healed. On the right he  does have 2 areas and be perfectly honest one of them is [name] skin cancer that he is going to the Mohs surgery clinic for the other seems to be healing nicely. Readmission: 08-02-2022 upon evaluation today patient appears for reevaluation here in our clinic concerning issues that he has been having with wounds over the bilateral lower extremities. I last saw him in May 2023 and at that point we had him completely healed. Unfortunately he is tells me this has broken down to some degree since that point. 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 He tells me this 1 seems to just be getting bigger and bigger not improving. Fortunately he is not having any significant pain which is good news he does have quite [name] bit of swelling and he tells me that his fluid pills are not recommended for him to take daily but just in 3-day intervals here and there. 08-09-2022 upon evaluation today patient appears to be doing still somewhat poorly in regard to his legs although in general he does not appear to be feeling as good as he has been. Fortunately there does not appear to be any signs of infection which is good news. With that being said he is having some issues here with having and overall poor feeling in general which again is good I think going to be the biggest complicating factor. He actually seems to be coughing I do not hear any wheezing right now I did listen to his chest he did not have good airflow down low however makes me suspicious for bronchitis or even possibly pneumonia which could be part of what is going on here as well. Fortunately I do not see any evidence of anything worsening in regard to his legs but I definitely believe that he needs to continue with the compression wraps he took them off  yesterday to shower has not had anything on for 24 hours that is why his legs are so swollen today. With regard to his pathology report I did review that it showed some squamous abnormality but no signs of distinct carcinoma. With that being said it was saying that it could be adjacent to [name] squamous cell carcinoma nonetheless my suggestion is can be that we have the patient take copy of this report and give it to his Mohs surgeon in order for them to see if there is anything they feel like needs to be done further. With that being said right now I feel like the primary thing is going to be for us  to try to get his swelling down and keep that down into that hand since he is having so much drainage I believe we can have to bring him in for dressing changes twice [name] week doing [name] nurse visit on Mondays. 11/9; since the patient was last here he spent the night in the emergency room he received IV Lasix . Also  received antibiotics although he was not discharged on either 1 of these. He also saw his cardiology office who put him on regular Lasix  20 mg [previously on as needed Lasix  20 mg]. Per our intake nurse the swelling in his legs is remarkably better but he still has bilateral lower extremity wounds. He still has wounds on the bilateral lower extremities most problematically on the left lateral calf. He has been using silver alginate under 3 layer compression. 08-23-2022 upon evaluation today patient appears to be doing much better than the last time I saw him 2 weeks ago. At that point I was very concerned about how he was doing he did see Dr. Gasper his primary care provider they got him on some blood pressure medication in general his color and overall appearance looks to be doing much improved compared to the last time I saw him. 09-04-2022 upon evaluation today patient appears to be doing well currently in regard to his wounds. Everything is showing signs of improvement which is great news.  Fortunately there does not appear to be any signs of active infection locally or systemically at this time. No fevers, chills, nausea, vomiting, or diarrhea. 09-10-2022 upon evaluation today patient appears to be doing better in regard to his wounds although the [facility] was extremely stuck to the wound bed. [name], [name] ([id number]) [id number] Page 4 of 14 Fortunately there does not appear to be any signs of infection locally or systemically at this time which is great news. No fevers, chills, nausea, vomiting, or diarrhea. 09-17-2022 upon evaluation today patient appears to be doing well currently in regard to his wounds in general. The right leg actually showing signs of excellent improvement and very pleased with where things stand in that regard. Fortunately I do not see any evidence of infection locally or systemically at this time which is great news. No fevers, chills, nausea, vomiting, or diarrhea. 09-24-2022 upon evaluation today patient appears to be doing well currently in regard to his wounds. Things look to be doing quite well. 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 He is seeing his dermatologist tomorrow in that regard. With that being said other than that however he seems to really be making some pretty good progress here which is good news. No fevers, chills, nausea, vomiting, or diarrhea. 12/26; the patient has 2 open wounds remaining on the left leg. One is on the left anterior mid tibia and the other is on the right lateral knee just outside of the popliteal fossa. 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 The patient has been to see dermatology Dr. Dela who apparently is making him [name] referral to the [facility] Mohs surgery center. He does not yet have an  appointment 10-12-2022 upon evaluation today patient appears to be doing well currently in regard to his wound. He has been tolerating the dressing changes without complication and overall feel like we are headed in the right direction. Fortunately I do not see any signs of infection locally or systemically at this time which is great news. No fevers, chills, nausea, vomiting, or diarrhea. 10-23-2022 upon evaluation today patient appears to be doing well currently in regard to his wound. He has been tolerating the dressing changes without complication. Fortunately there does not appear to be any signs of active infection locally nor  systemically which is great news and overall I am extremely pleased with where we stand currently. No fevers, chills, nausea, vomiting, or diarrhea. 10-26-2022 upon evaluation today patient appears to be doing well currently in regard to his wounds. Everything is showing signs of improvement and this is great news. Fortunately I see no evidence of active infection systemically. He does seem to be doing much better in regard to the local infection in regard to his leg. The smell is also greatly improved. Overall I am extremely happy with where we stand today. This is after just [name] few days with the antibiotic on board. 11-05-2022 upon evaluation today patient appears to be doing well currently in regard to his wounds although the wound where they performed the Mohs surgery does look [name] little bit hyper granulated I think switching to Hydrofera Blue may be better for him. He voiced understanding. Fortunately there does not appear to be any evidence of active infection locally nor systemically at this time. 11-12-2022 upon evaluation today patient appears to be doing better in regard to both wounds he has been tolerating the dressing changes without complication. There is no signs of infection and in general I think you are doing quite well. No fevers, chills, nausea, vomiting, or  diarrhea. 11-20-2022 upon evaluation today patient appears to be doing well currently in regard to his wounds. He has been tolerating the dressing changes without complication. Fortunately there does not appear to be any signs of infection at this time. No fevers, chills, nausea, vomiting, or diarrhea. 11-27-2022 upon evaluation today patient appears to be doing somewhat poorly in regard to his leg in general he has [name] lot of areas where he looks like he had some spots that popped up. There with regard to new possible blisters. In general I am actually very concerned about the fact that the wrap may be causing some irritation here. I think that we can try to not do the wrap for 1 week, and given the prescription for mupirocin ointment which I will send into the pharmacy for him. 12-04-2022 upon evaluation today patient appears to be doing well currently in regard to his wounds in fact he appears to be pretty much completely healed based on what I am seeing at this point. I do not see any signs of active infection locally nor systemically at this time which is great news. No fevers, chills, nausea, vomiting, or diarrhea. 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 I discussed that with him today he is aware of the situation.-2024 upon evaluation patient presents for readmission here in the clinic actually last saw him December 04, 2022. With that being said previously his dermatologist had asked that if there was something the need to be addressed from [name] dermatology standpoint and specifically biopsy that we make referral back to them to allow them to do it which I am definitely happy to do. 01-31-2023 upon evaluation today patient appears to be doing well currently in regard to his wound in fact this looks better he did see Dr. Dela  his dermatologist yesterday and they did perform [name] biopsy. With that being said he actually looks like he is doing much better Dr. Dela feels like this may not be [name] cancerous lesion which will be very good news at the same time I definitely wanted  to make sure especially considering his history and the way this looks when we saw him last week. Nonetheless I am extremely pleased with the fact that he is doing so much better at this point. 02-07-2023 upon evaluation today patient appears to be doing well currently in regard to his wound from the standpoint of size it has not gotten any larger. With that being said he does have some issues here still with what appears to be potentially some infection. Wound does not look quite as good as it did last week. We are still waiting on the results for the pathology. I did put [name] call into dermatology but I have not heard anything back from them as of yet. 02-14-2023 upon evaluation today patient appears to be doing well with regard to his wound infection is definitely under control and looks much better. Fortunately I do not see any signs of systemic infection with anyone locally I think this is improved greatly. 02-19-2023 upon evaluation today patient appears to be doing well currently in regard to his wound which is actually measuring smaller and looking much better. Fortunately I do not see any evidence of active infection locally nor systemically which is great news and overall I am extremely pleased with where we stand today. 02-26-2023 upon evaluation today patient appears to be doing well currently in regard to his wound. He is actually been tolerating the dressing changes without complication. Fortunately there does not appear to be any signs of active infection locally nor systemically which is great news and overall I am extremely pleased with where we stand currently. 03-12-2023 upon evaluation today patient's wound actually showed signs of excellent  improvement. I am very pleased with where we stand I do believe that we are making good progress here. I do not see any signs of active infection. 03-19-2023 upon evaluation today patient actually appears to be making excellent progress in regard to his leg and getting this closed. In fact is just [name] very small area that is actually open at this point. I am actually very pleased with what we are seeing today. 03-26-2023 upon evaluation today patient appears to be doing well currently in regard to his leg which is actually showing signs of being completely healed. Fortunately I do not see any signs of active infection locally or systemically which is great news and in general I do believe that we are moving in the right direction here. Readmission: 05-16-2023 upon evaluation today patient presents for reevaluation here in the clinic concerning [name] wound on the left anterior/lateral lower extremity. This is similar to where the wound was last time I saw him but not exactly the same. Fortunately there does not appear to be any signs of active infection at this time which is great news. The patient's past medical history really has not changed significantly since last time I saw him. 05-24-2023 upon evaluation patient actually appears to be doing excellent in regard to his leg ulcer. He has been tolerating the dressing changes without complication in general I do feel like there were making excellent headway towards complete closure. I do not see any signs of active infection at this point. [name], [name] ([id number]) [id number] Page 5 of 14 8/29; left lateral lower leg in the setting of chronic venous insufficiency. We have been using Hydrofera Blue under Urgo K2. Wounds are making nice improvements 06-20-2023 upon evaluation today patient appears to be doing well currently in regard to his leg ulcer. This is actually  showing signs of being significantly smaller compared even last  week's nurse visit this looks much better. Fortunately I do not see any need for sharp debridement he seems to be doing quite well. 06-27-2023 upon evaluation today patient appears to be doing well currently in regard to his wound. He is tolerating the dressing changes without complication. There is [name] little bit of hypergranulation on the use of silver nitrate today to help keep this under control. Fortunately I do not see any evidence of active infection locally or systemically which is great news. 07-04-2023 upon evaluation today patient appears to be doing well currently in regard to his wound. He has been tolerating the dressing changes without complication. Fortunately I do not see any signs of active infection locally or systemically which is great news and in general I do believe that he is making really good headway towards complete closure. This looks much better after starting the antibiotics. 07-09-2023 upon evaluation today patient appears to be doing excellent in regard to his leg which is actually significantly improved. I am very pleased with where we stand and I do believe that he is making really good headway towards getting this closed in fact it pretty much appears to be almost closed today there is just [name] very tiny area that I think needs 1 week to toughen up before we cut him loose completely. 07-16-2023 upon evaluation today patient appears to be doing well currently in regard to his wounds. In fact everything is completely healed and looks to be doing great. Fortunately I do not see any signs of active infection at this time which is great news. No fevers, chills, nausea, vomiting, or diarrhea. 07-30-2023 upon evaluation today patient unfortunately has [name] breakdown of the wound on the lateral aspect of his leg. Unfortunately I am [name] little concerned about the overall appearance of the leg I feel like he may require [name] biopsy to further evaluate the situation. I discussed that with the  patient today. With that being said I previously been asked by Dr. Dela not to perform any of the biopsies myself but to let them see it prior to the biopsy. For that reason I would go ahead and see about making referral to Dr. Charlyn office to get this scheduled as quickly as possible is my hope. We reached out to them have not heard back as of yet. 08-08-2023 upon evaluation today patient's wound actually showed signs of doing really about the same with regard to his wound. There does not appear to be any signs of active infection at this time which is good news with that being said they did not biopsy this at the office today when he went to dermatology. This is unfortunate as I thought that we can do that now. I will get [name] try to see if we get in touch with Dr. Charlyn office if were not able to get this done before I see him next week, going to just do the biopsy myself just to make sure we know what we are dealing with patient voiced understanding he is in agreement with the plan. 08-15-2023 upon evaluation today patient appears to be doing pretty well currently in regard to his wound which is okay he did have [name] biopsy today apparently they had [name] hard time getting it to stop bleeding that had [name] pressure dressing on pretty tightly the good news is hemostasis has been achieved. With that being said the patient tells me that he unfortunately  when they unwrapped him had several areas that were somewhat pussy and opened in regard to his leg I think he may have [name] bit of an infection ensuing again already. 08-22-2023 upon evaluation today patient's leg appears to be somewhat better in regards to the infection but unfortunately is still having some issues here with the wound the good news is Dr. Charlyn report did come to me and the patient did have [name] staph infection but otherwise did not have any signs of cancer pathology. The pathology report in fact showed stasis dermatitis and that was  it. 08-29-2023 upon evaluation today patient's leg is actually showing signs of significant improvement. Fortunately I see no evidence of worsening overall and I do believe that he is making good headway here towards closure. 09-19-2023 upon evaluation today patient's wound is actually showing signs of excellent improvement and seems to be making really good headway here towards closure. I am actually very pleased with where we stand. I do not see any signs of active infection at this time. 09-26-2023 upon evaluation today patient appears to be still having issues with this lateral leg ulcer he has 2 new places that are showing up as well on the lateral leg on the left. 1 [name] little bit higher up 1 just below. With that being said unfortunately this has continued to be an ongoing issue for him he is obviously very frustrated today which I completely understand. 12/31; he has 2 areas on the left lateral leg proximal and distal. The proximal area looks like it is trying to epithelialize and places and the distal 1 is slightly smaller. We have been using Hydrofera Blue under Urgo take K2 compression's. 10-17-23 upon evaluation today patient appears to be doing poorly currently in regard to the proximal portion of his leg laterally this unfortunately is something that open last week and is still continuing to get in trouble. With that being said I think he needs to have this change more frequently I would recommend 2 times [name] week dressing changes for him at this point to try to get this better and done. The original wound is looking good although does have some slough and biofilm noted. Electronic Signature(s) Signed: 10/17/2023 1:15:37 PM By: [name] Ferraris PA-C Entered By: [name] Ferraris on 10/17/2023 13:15:36 -------------------------------------------------------------------------------- Physical Exam Details Patient Name: Date of Service: [name] LABOR [name] 10/17/2023 12:15 PM Medical Record Number:  [id number] Patient Account Number: [id number] Date of Birth/Sex: Treating RN: [birth year] (78 y.o. [name] Primary Care Provider: Gasper Golas Other Clinician: Referring Provider: Treating Provider/Extender: Navin, Dogan, Laconia [name] ([id number]) [id number] Page 6 of 14 Weeks in Treatment: 22 Constitutional Well-nourished and well-hydrated in no acute distress. Respiratory normal breathing without difficulty. Psychiatric this patient is able to make decisions and demonstrates good insight into disease process. Alert and Oriented x 3. pleasant and cooperative. Notes Upon inspection patient's wound bed actually showed signs of good granulation and epithelization at this point. Fortunately I do not see any evidence of active infection locally or systemically which is great news and in general I do think that we are making headway here towards closure which is good news. Electronic Signature(s) Signed: 10/17/2023 1:15:48 PM By: [name] Ferraris PA-C Entered By: [name] Ferraris on 10/17/2023 13:15:48 -------------------------------------------------------------------------------- Physician Orders Details Patient Name: Date of Service: [name] [name], [name] 10/17/2023 12:15 PM Medical Record Number: [id number] Patient Account Number: [id number] Date of Birth/Sex: Treating RN: 10-02-1946 (78 y.o. M)  [name] Primary Care Provider: Gasper Golas Other Clinician: Referring Provider: Treating Provider/Extender: [name] Gasper Golas Devra in Treatment: 22 Verbal / Phone Orders: No Diagnosis Coding ICD-10 Coding Code Description I89.0 Lymphedema, not elsewhere classified 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 L97.822 Non-pressure chronic ulcer of other part of left lower leg with fat layer exposed I73.89 Other specified peripheral vascular diseases I48.0 Paroxysmal atrial  fibrillation I10 Essential (primary) hypertension Follow-up Appointments Return Appointment in 1 week. Nurse Visit as needed Bathing/ Shower/ Hygiene May shower with wound dressing protected with water repellent cover or cast protector. Edema Control - Orders / Instructions UrgoK2 40mmhg Elevate, Exercise Daily and [name] void Standing for Long Periods of Time. Elevate leg(s) parallel to the floor when sitting. Wound Treatment Wound #21R - Lower Leg Wound Laterality: Left, Lateral, Distal Cleanser: Soap and Water 1 x Per Week/30 Days Discharge Instructions: Gently cleanse wound with antibacterial soap, rinse and pat dry prior to dressing wounds Cleanser: Wound Cleanser 1 x Per Week/30 Days Discharge Instructions: Wash your hands with soap and water. Remove old dressing, discard into plastic bag and place into trash. Cleanse the wound with Wound Cleanser prior to applying [name] clean dressing using gauze sponges, not tissues or cotton balls. Do not scrub or use excessive [name], [name] ([id number]) [id number] Page 7 of 14 force. Pat dry using gauze sponges, not tissue or cotton balls. Peri-Wound Care: AandD Ointment 1 x Per Week/30 Days Discharge Instructions: Apply AandD Ointment as directed Prim Dressing: Silvercel Small 2x2 (in/in) 1 x Per Week/30 Days ary Discharge Instructions: Apply Silvercel Small 2x2 (in/in) as instructed Secondary Dressing: Zetuvit Plus 4x8 (in/in) 1 x Per Week/30 Days Compression Wrap: Urgo K2, two layer compression system, regular 1 x Per Week/30 Days Wound #22 - Lower Leg Wound Laterality: Left, Lateral, Proximal Cleanser: Soap and Water 1 x Per Week/30 Days Discharge Instructions: Gently cleanse wound with antibacterial soap, rinse and pat dry prior to dressing wounds Cleanser: Wound Cleanser 1 x Per Week/30 Days Discharge Instructions: Wash your hands with soap and water. Remove old dressing, discard into plastic bag and place into  trash. Cleanse the wound with Wound Cleanser prior to applying [name] clean dressing using gauze sponges, not tissues or cotton balls. Do not scrub or use excessive force. Pat dry using gauze sponges, not tissue or cotton balls. Peri-Wound Care: AandD Ointment 1 x Per Week/30 Days Discharge Instructions: Apply AandD Ointment as directed Prim Dressing: Silvercel Small 2x2 (in/in) 1 x Per Week/30 Days ary Discharge Instructions: Apply Silvercel Small 2x2 (in/in) as instructed Secondary Dressing: Zetuvit Plus 4x8 (in/in) 1 x Per Week/30 Days Compression Wrap: Urgo K2, two layer compression system, regular 1 x Per Week/30 Days Electronic Signature(s) Signed: 10/17/2023 3:53:53 PM By: [name] Andre PA-C Signed: 10/17/2023 4:26:36 PM By: [name] Blossom MSN RN CNS WTA Entered By: [name] on 10/17/2023 13:08:25 -------------------------------------------------------------------------------- Problem List Details Patient Name: Date of Service: [name] ELWIN SAILOR, [name] 10/17/2023 12:15 PM Medical Record Number: [id number] Patient Account Number: [id number] Date of Birth/Sex: Treating RN: 10-30-1945 (78 y.o. [name] Primary Care Provider: Gasper Golas Other Clinician: Referring Provider: Treating Provider/Extender: [name] Gasper Golas Devra in Treatment: 22 Active Problems ICD-10 Encounter Code Description Active Date MDM Diagnosis I89.0 Lymphedema, not elsewhere classified 05/16/2023 No Yes I87.333 Chronic venous hypertension (idiopathic) with ulcer and inflammation of 05/16/2023 No Yes bilateral lower extremity L97.822 Non-pressure chronic ulcer of other part of left lower leg  with fat layer exposed8/05/2023 No Yes [name], [name] ([id number]) [id number] Page 8 of 14 I73.89 Other specified peripheral vascular diseases 05/16/2023 No Yes I48.0 Paroxysmal atrial fibrillation 05/16/2023 No Yes I10 Essential (primary) hypertension 05/16/2023 No Yes Inactive  Problems Resolved Problems Electronic Signature(s) Signed: 10/17/2023 12:28:20 PM By: [name] Ferraris PA-C Entered By: [name] Ferraris on 10/17/2023 12:28:20 -------------------------------------------------------------------------------- Progress Note Details Patient Name: Date of Service: [name] [name], [name] 10/17/2023 12:15 PM Medical Record Number: [id number] Patient Account Number: [id number] Date of Birth/Sex: Treating RN: 1945/12/03 (78 y.o. 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 the patient has had [name] vascular workup over 7 years ago and said everything was normal at that stage. He does not have any chronic problems except for cardiac issues which he sees [name] cardiologist in [place]. 08/15/2017 -- arterial and venous duplex studies still pending. 08/23/2017 -- venous reflux studies done on 08/13/2017 shows venous incompetence throughout the left lower extremity deep system and focally at the left saphenofemoral junction. 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 No evidence of SVT or DVT in bilateral lower extremities The patient has an appointment at the end of the month to get his arterial duplex study done 09/05/2017 -- the patient was seen at the vein and vascular office yesterday by Mariellen Iha. ABI studies were notable for medial calcification  and the toe brachial indices were normal and bilateral ankle-brachial) waveforms were normal with triphasic flow. After review of his venous studies he was not [name] candidate for laser ablation and his lymphedema was to be treated with compression stockings and lymphedema pump pumps 09/12/2017 -- had [name] low arterial study done at the [facility] -- unable to obtain reliable ABI is due to medial calcification. Bilateral toe indices were normal with the right being 1.01 and the left being 0.92 and the waveforms were triphasic bilaterally. he did get hold of 30-40 mm compression stockings but is unable to put these on. We will try and get him alternative compression stockings. 09/26/17- he is here in follow up evaluation of [name] right lower extremity ulcer;he is compliant in wearing compression stocking; ulcer almost epithelialized , anticipate healing next appointment Readmission: [name], [name] ([id number]) [id number] Page 9 of 14 11/17 point upon evaluation patient's wound currently that he is seeing us  for today is [name] skin cancerous lesion that was cleared away by his dermatologist on the left medial calf region. He tells me that this is [name] very similar thing to what he had done previously in fact the last time he saw him in 2018 this was also what was going on at that point. Nonetheless he feels that based on what he seeing currently that this is just having [name] lot of harder time healing although it is much closer to the surface than what he is experienced in the past. He notes that the initial removal was in June 2022 which was this year this is now November and still has not closed. He does have some edema and  definitely I think that there is some venous component to his slow healing here. Also think that we can do something better than Vaseline to try to help with getting this to clear up as quickly as possible. He does have [name] history of atrial  fibrillation and is on Eliquis otherwise he really has no major medical problems that would affect wound healing. 09/07/2021 upon evaluation today patient actually appears to be doing significantly better after having wrapped him last week. Overall I think that this is making significant improvements at this time which is great news. I do not see any evidence of infection which is great news as well. No fevers, chills, nausea, vomiting, or diarrhea. 09/14/2021 upon evaluation today patient appears to be doing well with regard to his leg ulcer. He has been tolerating the dressing changes and overall I think that he is making excellent progress. I do not see any signs of active infection at this time. 09/21/2021 upon evaluation today patient actually appears to be making good progress with regard to his wound this is again measuring smaller today no debridement seems to be necessary. We have been using [name] silver collagen dressing and I think that is doing an awesome job. 09/28/2021 upon evaluation today patient appears to be doing well with regard to his leg currently. I do not see any signs of active infection at this time which is great news. No fevers, chills, nausea, vomiting, or diarrhea. I think this wound is very close to complete resolution. 10/12/2021 upon evaluation today patient actually appears to be doing awesome in regard to his leg ulcer. In fact this appears to be completely healed based on what I am seeing currently. I do not see any evidence of active infection locally nor systemically at this time which is also great news. No fevers, chills, nausea, vomiting, or diarrhea. Readmission: 12/07/2021 upon evaluation today patient presents for readmission here in the clinic. He was discharged on 10/12/2021 is completely healed. Unfortunately this has reopened at this point and he is having continual issues with new blisters over both lower extremities. This is even worse than what we previously  saw. Nonetheless we did actually check his ABIs today and it did reveal that his ABIs were 0.55 on the left and 0.57 on the right. Subsequently this is [name] definite change from his last arterial study which showed that he did have good blood flow at 1.01 on the right and 0.92 on the left and that was right at the beginning of 2019. Nonetheless based on what we see currently I do think he tolerated the 3 layer compression wrap but I do believe that we probably need to get him tested for his arterial flow in order to see where things stand and if there is something we can do there that would help prevent this from continue to be an ongoing issue. He did not utilize compression socks in the interim from when he was last here till this time. That something is probably going to need lifelong going forward as well. 3/9; patient presents for follow-up. He has no issues or complaints today. He tolerated the compression wrap well. He had ABIs with TBI's done. He denies signs of infection. 12/21/2021 upon evaluation today patient appears to be doing well with regard to the wounds on his legs. Both are showing signs of significant improvement which is great news although I do believe some sharp debridement would be of benefit here as well. 12/28/2021 upon evaluation today  patient appears to be doing well with regard to his wounds. Everything is showing signs of excellent improvement which I am very pleased about. I think that we are headed in the right direction here. Fortunately there does not appear to be any evidence of infection which is great news there is [name] little bit of hypergranulation. 01/04/2022 upon evaluation today patient appears to be doing well with regard to his wounds 2 of them are healed 1 is almost so and the other 1 is significantly better. Overall I am extremely pleased with where we stand and I think that he is making excellent progress here. I do not see any evidence of active  infection locally nor systemically at this time. 01-16-2022 upon evaluation today patient's wound on the left leg is showing signs of doing quite well. Has not completely cleared at this point but it is much improved. Fortunately I do not see any signs of infection at this time. No fevers, chills, nausea, vomiting, or diarrhea. 01-23-2022 upon evaluation today patient's wound of the left leg actually appears to be pretty much completely healed which is great news. I do not see any signs of active infection locally or systemically which is excellent. With that being said on the right leg what wound is measuring smaller the other 1 is [name] new wound that just showed up fortunately its not too bad. Has been using Xeroform here and that seems to be doing decently well which is great news. Unfortunately his blood pressure is significantly high we gave him the readings for the past 4-5 visits as well as [name] recommendation to make an appointment to go discuss this with his primary care provider patient states that he is going to look into doing this. 01-30-2022 upon evaluation today patient appears to be doing well with regard to his left leg everything appears to be healed. On the right leg the more anterior wound is healed the more medial wound that I been concerned about [name] possible skin cancer unfortunately still does not look great to me. I do believe that we should probably do [name] biopsy I have talked about it with him [name] few times I think though it is probably time to go ahead and do this at this point. 02-09-2022 upon evaluation today patient appears to be doing well with regard to his legs. On the left this appears to be completely healed. On the right he does have 2 areas and be perfectly honest one of them is [name] skin cancer that he is going to the [facility] clinic for the other seems to be healing nicely. Readmission: 08-02-2022 upon evaluation today patient appears for reevaluation here in our clinic  concerning issues that he has been having with wounds over the bilateral lower extremities. I last saw him in May 2023 and at that point we had him completely healed. Unfortunately he is tells me this has broken down to some degree since that point. 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 He tells me this 1 seems to just be getting bigger and bigger not improving. Fortunately he is not having any significant pain which is good news he does have quite [name] bit of swelling and he tells me that his fluid pills are not recommended for him  to take daily but just in 3-day intervals here and there. 08-09-2022 upon evaluation today patient appears to be doing still somewhat poorly in regard to his legs although in general he does not appear to be feeling as good as he has been. Fortunately there does not appear to be any signs of infection which is good news. With that being said he is having some issues here with having and overall poor feeling in general which again is good I think going to be the biggest complicating factor. He actually seems to be coughing I do not hear any wheezing right now I did listen to his chest he did not have good airflow down low however makes me suspicious for bronchitis or even possibly pneumonia which could be part of what is going on here as well. Fortunately I do not see any evidence of anything worsening in regard to his legs but I definitely believe that he needs to continue with the compression wraps he took them off yesterday to shower has not had anything on for 24 hours that is why his legs are so swollen today. With regard to his pathology report I did review that it showed some squamous abnormality but no signs of distinct carcinoma. 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 With that being said right now I feel like the primary thing is going to be for us  to try to get his swelling down and keep that down into that hand since he is having so much drainage I believe we can have to bring him in for dressing changes twice [name] week doing [name] nurse visit on Mondays. 11/9; since the patient was last here he spent the night in the emergency room he received IV Lasix . Also received antibiotics although he was not discharged on either 1 of these. He also saw his cardiology office who put him on regular Lasix  20 mg [previously on as needed Lasix  20 mg]. Per our intake nurse the swelling in his legs is remarkably better but he still has bilateral lower extremity wounds. [name], [name] ([id number]) [id number] Page 10 of 14 He still has wounds on the bilateral lower extremities most problematically on the left lateral calf. He has been using silver alginate under 3 layer compression. 08-23-2022 upon evaluation today patient appears to be doing much better than the last time I saw him 2 weeks ago. At that point I was very concerned about how he was doing he did see Dr. Gasper his primary care provider they got him on some blood pressure medication in general his color and overall appearance looks to be doing much improved compared to the last time I saw him. 09-04-2022 upon evaluation today patient appears to be doing well currently in regard to his wounds. Everything is showing signs of improvement which is great news. Fortunately there does not appear to be any signs of active infection locally or systemically at this time. No fevers, chills, nausea, vomiting, or diarrhea. 09-10-2022 upon evaluation  today patient appears to be doing better in regard to his wounds although the [facility] was extremely stuck to the wound bed. Fortunately there does not appear to be any signs of infection  locally or systemically at this time which is great news. No fevers, chills, nausea, vomiting, or diarrhea. 09-17-2022 upon evaluation today patient appears to be doing well currently in regard to his wounds in general. The right leg actually showing signs of excellent improvement and very pleased with where things stand in that regard. Fortunately I do not see any evidence of infection locally or systemically at this time which is great news. No fevers, chills, nausea, vomiting, or diarrhea. 09-24-2022 upon evaluation today patient appears to be doing well currently in regard to his wounds. Things look to be doing quite well. 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 He is seeing his dermatologist tomorrow in that regard. With that being said other than that however he seems to really be making some pretty good progress here which is good news. No fevers, chills, nausea, vomiting, or diarrhea. 12/26; the patient has 2 open wounds remaining on the left leg. One is on the left anterior mid tibia and the other is on the right lateral knee just outside of the popliteal fossa. 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 The patient has been to see dermatology Dr. Dela who apparently is making him [name] referral to the [facility] Mohs surgery center. He does not yet have an appointment 10-12-2022 upon evaluation today patient appears to be doing well currently in regard to his wound. He has been tolerating the dressing changes without complication and overall feel like we are headed in the right direction. Fortunately I do not see any signs of infection  locally or systemically at this time which is great news. No fevers, chills, nausea, vomiting, or diarrhea. 10-23-2022 upon evaluation today patient appears to be doing well currently in regard to his wound. He has been tolerating the dressing changes without complication. Fortunately there does not appear to be any signs of active infection locally nor systemically which is great news and overall I am extremely pleased with where we stand currently. No fevers, chills, nausea, vomiting, or diarrhea. 10-26-2022 upon evaluation today patient appears to be doing well currently in regard to his wounds. Everything is showing signs of improvement and this is great news. Fortunately I see no evidence of active infection systemically. He does seem to be doing much better in regard to the local infection in regard to his leg. The smell is also greatly improved. Overall I am extremely happy with where we stand today. This is after just [name] few days with the antibiotic on board. 11-05-2022 upon evaluation today patient appears to be doing well currently in regard to his wounds although the wound where they performed the Mohs surgery does look [name] little bit hyper granulated I think switching to Hydrofera Blue may be better for him. He voiced understanding. Fortunately there does not appear to be any evidence of active infection locally nor systemically at this time. 11-12-2022 upon evaluation today patient appears to be doing better in regard to both wounds he has been tolerating the dressing changes without complication. There is no signs of infection and in general I think you are doing quite well. No fevers, chills, nausea, vomiting, or diarrhea. 11-20-2022 upon evaluation today patient appears to be doing well currently in regard to his wounds. He has been tolerating the dressing changes without complication. Fortunately there does not appear to  be any signs of infection at this time. No fevers, chills, nausea,  vomiting, or diarrhea. 11-27-2022 upon evaluation today patient appears to be doing somewhat poorly in regard to his leg in general he has [name] lot of areas where he looks like he had some spots that popped up. There with regard to new possible blisters. In general I am actually very concerned about the fact that the wrap may be causing some irritation here. I think that we can try to not do the wrap for 1 week, and given the prescription for mupirocin ointment which I will send into the pharmacy for him. 12-04-2022 upon evaluation today patient appears to be doing well currently in regard to his wounds in fact he appears to be pretty much completely healed based on what I am seeing at this point. I do not see any signs of active infection locally nor systemically at this time which is great news. No fevers, chills, nausea, vomiting, or diarrhea. 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 I discussed that with him today he is aware of the situation.-2024 upon evaluation patient presents for readmission here in the clinic actually last saw him December 04, 2022. With that being said previously his dermatologist had asked that if there was something the need to be addressed from [name] dermatology standpoint and specifically biopsy that we make referral back to them to allow them to do it which I am definitely happy to do. 01-31-2023 upon evaluation today patient appears to be doing well currently in regard to his wound in fact this looks better he did see Dr. Dela his dermatologist yesterday and they did perform [name] biopsy. With that being said he actually looks like he is doing much better Dr. Dela feels like this may not be [name] cancerous lesion which will be very good news at the same time I definitely wanted to make sure especially considering his history  and the way this looks when we saw him last week. Nonetheless I am extremely pleased with the fact that he is doing so much better at this point. 02-07-2023 upon evaluation today patient appears to be doing well currently in regard to his wound from the standpoint of size it has not gotten any larger. With that being said he does have some issues here still with what appears to be potentially some infection. Wound does not look quite as good as it did last week. We are still waiting on the results for the pathology. I did put [name] call into dermatology but I have not heard anything back from them as of yet. 02-14-2023 upon evaluation today patient appears to be doing well with regard to his wound infection is definitely under control and looks much better. Fortunately I do not see any signs of systemic infection with anyone locally I think this is improved greatly. 02-19-2023 upon evaluation today patient appears to be doing well currently in regard to his wound which is actually measuring smaller and looking much better. Fortunately I do not see any evidence of active infection locally nor systemically which is great news and overall I am extremely pleased with where we stand today. 02-26-2023 upon evaluation today patient appears to be doing well currently in regard to his wound. He is actually been tolerating the dressing changes without complication. Fortunately there does  not appear to be any signs of active infection locally nor systemically which is great news and overall I am extremely pleased with where we stand currently. 03-12-2023 upon evaluation today patient's wound actually showed signs of excellent improvement. I am very pleased with where we stand I do believe that we are making good progress here. I do not see any signs of active infection. 03-19-2023 upon evaluation today patient actually appears to be making excellent progress in regard to his leg and getting this closed. In fact is just [name]  very small area that is actually open at this point. I am actually very pleased with what we are seeing today. [name], [name] ([id number]) [id number] Page 11 of 14 03-26-2023 upon evaluation today patient appears to be doing well currently in regard to his leg which is actually showing signs of being completely healed. Fortunately I do not see any signs of active infection locally or systemically which is great news and in general I do believe that we are moving in the right direction here. Readmission: 05-16-2023 upon evaluation today patient presents for reevaluation here in the clinic concerning [name] wound on the left anterior/lateral lower extremity. This is similar to where the wound was last time I saw him but not exactly the same. Fortunately there does not appear to be any signs of active infection at this time which is great news. The patient's past medical history really has not changed significantly since last time I saw him. 05-24-2023 upon evaluation patient actually appears to be doing excellent in regard to his leg ulcer. He has been tolerating the dressing changes without complication in general I do feel like there were making excellent headway towards complete closure. I do not see any signs of active infection at this point. 8/29; left lateral lower leg in the setting of chronic venous insufficiency. We have been using Hydrofera Blue under Urgo K2. Wounds are making nice improvements 06-20-2023 upon evaluation today patient appears to be doing well currently in regard to his leg ulcer. This is actually showing signs of being significantly smaller compared even last week's nurse visit this looks much better. Fortunately I do not see any need for sharp debridement he seems to be doing quite well. 06-27-2023 upon evaluation today patient appears to be doing well currently in regard to his wound. He is tolerating the dressing changes without complication. There is  [name] little bit of hypergranulation on the use of silver nitrate today to help keep this under control. Fortunately I do not see any evidence of active infection locally or systemically which is great news. 07-04-2023 upon evaluation today patient appears to be doing well currently in regard to his wound. He has been tolerating the dressing changes without complication. Fortunately I do not see any signs of active infection locally or systemically which is great news and in general I do believe that he is making really good headway towards complete closure. This looks much better after starting the antibiotics. 07-09-2023 upon evaluation today patient appears to be doing excellent in regard to his leg which is actually significantly improved. I am very pleased with where we stand and I do believe that he is making really good headway towards getting this closed in fact it pretty much appears to be almost closed today there is just [name] very tiny area that I think needs 1 week to toughen up before we cut him loose completely. 07-16-2023 upon evaluation today patient appears to be doing well currently  in regard to his wounds. In fact everything is completely healed and looks to be doing great. Fortunately I do not see any signs of active infection at this time which is great news. No fevers, chills, nausea, vomiting, or diarrhea. 07-30-2023 upon evaluation today patient unfortunately has [name] breakdown of the wound on the lateral aspect of his leg. Unfortunately I am [name] little concerned about the overall appearance of the leg I feel like he may require [name] biopsy to further evaluate the situation. I discussed that with the patient today. With that being said I previously been asked by Dr. Dela not to perform any of the biopsies myself but to let them see it prior to the biopsy. For that reason I would go ahead and see about making referral to Dr. Charlyn office to get this scheduled as quickly as possible is my hope.  We reached out to them have not heard back as of yet. 08-08-2023 upon evaluation today patient's wound actually showed signs of doing really about the same with regard to his wound. There does not appear to be any signs of active infection at this time which is good news with that being said they did not biopsy this at the office today when he went to dermatology. This is unfortunate as I thought that we can do that now. I will get [name] try to see if we get in touch with Dr. Charlyn office if were not able to get this done before I see him next week, going to just do the biopsy myself just to make sure we know what we are dealing with patient voiced understanding he is in agreement with the plan. 08-15-2023 upon evaluation today patient appears to be doing pretty well currently in regard to his wound which is okay he did have [name] biopsy today apparently they had [name] hard time getting it to stop bleeding that had [name] pressure dressing on pretty tightly the good news is hemostasis has been achieved. With that being said the patient tells me that he unfortunately when they unwrapped him had several areas that were somewhat pussy and opened in regard to his leg I think he may have [name] bit of an infection ensuing again already. 08-22-2023 upon evaluation today patient's leg appears to be somewhat better in regards to the infection but unfortunately is still having some issues here with the wound the good news is Dr. Charlyn report did come to me and the patient did have [name] staph infection but otherwise did not have any signs of cancer pathology. 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 08-29-2023 upon evaluation today patient's leg is actually showing signs of significant improvement. Fortunately I see no evidence of worsening overall and I do believe that he is making good headway here towards closure. 09-19-2023 upon evaluation today patient's wound is actually showing signs of  excellent improvement and seems to be making really good headway here towards closure. I am actually very pleased with where we stand. I do not see any signs of active infection at this time. 09-26-2023 upon evaluation today patient appears to be still having issues with this lateral leg ulcer he has 2 new places that are showing up as well on the lateral leg on the left. 1 [name] little bit higher up 1 just below. With that being said unfortunately this has continued to be an ongoing issue for him he is obviously very frustrated today which I completely understand. 12/31;  he has 2 areas on the left lateral leg proximal and distal. The proximal area looks like it is trying to epithelialize and places and the distal 1 is slightly smaller. We have been using Hydrofera Blue under Urgo take K2 compression's. 10-17-23 upon evaluation today patient appears to be doing poorly currently in regard to the proximal portion of his leg laterally this unfortunately is something that open last week and is still continuing to get in trouble. With that being said I think he needs to have this change more frequently I would recommend 2 times [name] week dressing changes for him at this point to try to get this better and done. The original wound is looking good although does have some slough and biofilm noted. Objective Constitutional Well-nourished and well-hydrated in no acute distress. Vitals Time Taken: 12:18 PM, Height: 75 in, Weight: 220 lbs, BMI: 27.5, Temperature: 97.7 F, Pulse: 40 bpm, Respiratory Rate: 18 breaths/min, Blood Pressure: 169/72 mmHg. Respiratory [name], PRIMEAU [name] ([id number]) [id number] Page 12 of 14 normal breathing without difficulty. Psychiatric this patient is able to make decisions and demonstrates good insight into disease process. Alert and Oriented x 3. pleasant and cooperative. General Notes: Upon inspection patient's wound bed actually showed signs of good  granulation and epithelization at this point. Fortunately I do not see any evidence of active infection locally or systemically which is great news and in general I do think that we are making headway here towards closure which is good news. Integumentary (Hair, Skin) Wound #21R status is Open. Original cause of wound was Gradually Appeared. The date acquired was: 04/08/2023. The wound has been in treatment 22 weeks. The wound is located on the Left,Distal,Lateral Lower Leg. The wound measures 1.2cm length x 1cm width x 0.1cm depth; 0.942cm^2 area and 0.094cm^3 volume. There is [name] none present amount of drainage noted. Wound #22 status is Open. Original cause of wound was Gradually Appeared. The date acquired was: 09/09/2023. The wound has been in treatment 1 weeks. The wound is located on the Left,Proximal,Lateral Lower Leg. The wound measures 6cm length x 4cm width x 0.1cm depth; 18.85cm^2 area and 1.885cm^3 volume. There is [name] medium amount of sanguinous drainage noted. There is medium (34-66%) red granulation within the wound bed. There is no necrotic tissue within the wound bed. 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 There was [name] Excisional Skin/Subcutaneous Tissue Debridement with [name] total area of 0.94 sq cm performed by [name] Ferraris, PA-C. With the following instrument(s): Curette to remove Viable and Non-Viable tissue/material. Material removed includes Subcutaneous Tissue and Slough and after achieving pain control using Lidocaine  4% T opical Solution. No  specimens were taken. [name] time out was conducted at 13:04, prior to the start of the procedure. [name] Moderate amount of bleeding was controlled with Pressure. The procedure was tolerated well with [name] pain level of 0 throughout and [name] pain level of 0 following the procedure. Post Debridement Measurements: 1.2cm length x 1cm width x 0.1cm depth; 0.094cm^3 volume. Character of Wound/Ulcer Post Debridement is stable. 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  There was [name] Four Layer Compression Therapy Procedure by [name] Blossom, RN. Post procedure Diagnosis Wound #21R: Same as Pre-Procedure Plan Follow-up Appointments: Return Appointment in 1 week. Nurse Visit as needed Bathing/ Shower/ Hygiene: May shower with wound dressing protected with water repellent cover or cast protector. Edema Control - Orders / Instructions: UrgoK2 40mmhg Elevate, Exercise Daily and Avoid Standing for Long Periods of Time. Elevate leg(s) parallel to the floor when sitting. WOUND #21R: - Lower Leg Wound Laterality: Left, Lateral, Distal Cleanser: Soap and Water 1 x Per Week/30 Days Discharge Instructions: Gently cleanse wound with antibacterial soap, rinse and pat dry prior to dressing wounds Cleanser: Wound Cleanser 1 x Per Week/30 Days Discharge Instructions: Wash your hands with soap and water. Remove old dressing, discard into plastic bag and place into trash. Cleanse the wound with Wound Cleanser prior to applying [name] clean dressing using gauze sponges, not tissues or cotton balls. Do not scrub or use excessive force. Pat dry using gauze sponges, not tissue or cotton balls. Peri-Wound Care: AandD Ointment 1 x Per Week/30 Days Discharge Instructions: Apply AandD Ointment as directed Prim Dressing: Silvercel Small 2x2 (in/in) 1 x Per Week/30 Days ary Discharge Instructions: Apply Silvercel Small 2x2  (in/in) as instructed Secondary Dressing: Zetuvit Plus 4x8 (in/in) 1 x Per Week/30 Days Com pression Wrap: Urgo K2, two layer compression system, regular 1 x Per Week/30 Days WOUND #22: - Lower Leg Wound Laterality: Left, Lateral, Proximal Cleanser: Soap and Water 1 x Per Week/30 Days YAHMIR, SOKOLOV [name] ([id number]) [id number] Page 13 of 14 Discharge Instructions: Gently cleanse wound with antibacterial soap, rinse and pat dry prior to dressing wounds Cleanser: Wound Cleanser 1 x Per Week/30 Days Discharge Instructions: Wash your hands with soap and water. Remove old dressing, discard into plastic bag and place into trash. Cleanse the wound with Wound Cleanser prior to applying [name] clean dressing using gauze sponges, not tissues or cotton balls. Do not scrub or use excessive force. Pat dry using gauze sponges, not tissue or cotton balls. Peri-Wound Care: AandD Ointment 1 x Per Week/30 Days Discharge Instructions: Apply AandD Ointment as directed Prim Dressing: Silvercel Small 2x2 (in/in) 1 x Per Week/30 Days ary Discharge Instructions: Apply Silvercel Small 2x2 (in/in) as instructed Secondary Dressing: Zetuvit Plus 4x8 (in/in) 1 x Per Week/30 Days Com pression Wrap: Urgo K2, two layer compression system, regular 1 x Per Week/30 Days 1. I would recommend the patient should continue to utilize the silver alginate dressing for the upper wound laterally I think that this will help to keep it dry I think this going to do much better and hopefully will not stick as badly. 2. I am also can recommend also can recommend that we use the Hydrofera Blue to the original wound laterally which is more distal and I think that this is doing quite well. In regard to the leg in general I think changing this dressing more frequently would be helpful. 3. I will recommend we continue with Urgo K2 compression wrap. We will see patient back for reevaluation in 1 week here in the clinic. If  anything worsens or changes patient will contact our office for additional recommendations. Electronic Signature(s) Signed: 10/17/2023 1:16:43 PM By: [name] Ferraris PA-C Entered By: [name] Ferraris on 10/17/2023 13:16:43 -------------------------------------------------------------------------------- SuperBill Details Patient Name: Date of Service: [name] [name], [name] 10/17/2023 Medical Record Number: [id number] Patient Account Number: [id number] Date of Birth/Sex: Treating RN: Jan 27, 1946 (78 y.o. 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 I73.89 Other specified peripheral vascular diseases I48.0 Paroxysmal atrial fibrillation I10 Essential (primary) hypertension Facility Procedures : CPT4 Code: 63899987 Description: 11042 - DEB SUBQ TISSUE 20 SQ CM/< ICD-10 Diagnosis Description L97.822 Non-pressure chronic ulcer of other part of left lower leg with fat layer exposed Modifier: Quantity: 1 Physician Procedures : CPT4 Code Description Modifier 11042 11042 - WC PHYS SUBQ TISS 20 SQ CM ICD-10 Diagnosis Description L97.822 Non-pressure chronic ulcer of other part of left lower leg with fat layer exposed JEVAUGHN, DEGOLLADO [name] ([id number])  [id number] Page 14 of 14 Quantity: 1 Electronic Signature(s) Signed: 10/17/2023 1:16:51 PM By: [name] Andre PA-C Entered By: [name] on 10/17/2023 13:16:51

## 2023-10-21 ENCOUNTER — Ambulatory Visit: Payer: Medicare PPO | Admitting: Urology

## 2023-10-21 DIAGNOSIS — I10 Essential (primary) hypertension: Secondary | ICD-10-CM | POA: Diagnosis not present

## 2023-10-21 DIAGNOSIS — I480 Paroxysmal atrial fibrillation: Secondary | ICD-10-CM | POA: Diagnosis not present

## 2023-10-21 DIAGNOSIS — I428 Other cardiomyopathies: Secondary | ICD-10-CM | POA: Diagnosis not present

## 2023-10-21 DIAGNOSIS — I890 Lymphedema, not elsewhere classified: Secondary | ICD-10-CM | POA: Diagnosis not present

## 2023-10-21 DIAGNOSIS — L97822 Non-pressure chronic ulcer of other part of left lower leg with fat layer exposed: Secondary | ICD-10-CM | POA: Diagnosis not present

## 2023-10-21 DIAGNOSIS — I87333 Chronic venous hypertension (idiopathic) with ulcer and inflammation of bilateral lower extremity: Secondary | ICD-10-CM | POA: Diagnosis not present

## 2023-10-23 NOTE — Progress Notes (Signed)
 DELQUAN, POUCHER R ([id number]) [id number] Page 1 of 2 Visit Report for 10/21/2023 Physician Orders Details Patient Name: Date of Service: [name] DELENA WARD R. 10/21/2023 11:00 [name] M Medical Record Number: [id number] Patient Account Number: [id number] Date of Birth/Sex: Treating RN: 1946/08/16 (78 y.o. [name] Primary Care Provider: Gasper Golas Other Clinician: Referring Provider: Treating Provider/Extender: Bethena Andre Gasper Golas Devra in Treatment: 22 Verbal / Phone Orders: No Diagnosis Coding Follow-up Appointments Return Appointment in 1 week. Nurse Visit as needed Bathing/ Shower/ Hygiene May shower with wound dressing protected with water repellent cover or cast protector. Edema Control - Orders / Instructions UrgoK2 40mmhg Elevate, Exercise Daily and [name] void Standing for Long Periods of Time. Elevate leg(s) parallel to the floor when sitting. Wound Treatment Wound #21R - Lower Leg Wound Laterality: Left, Lateral, Distal Cleanser: Soap and Water 1 x Per Week/30 Days Discharge Instructions: Gently cleanse wound with antibacterial soap, rinse and pat dry prior to dressing wounds Cleanser: Wound Cleanser 1 x Per Week/30 Days Discharge Instructions: Wash your hands with soap and water. Remove old dressing, discard into plastic bag and place into trash. Cleanse the wound with Wound Cleanser prior to applying [name] clean dressing using gauze sponges, not tissues or cotton balls. Do not scrub or use excessive force. Pat dry using gauze sponges, not tissue or cotton balls. Peri-Wound Care: AandD Ointment 1 x Per Week/30 Days Discharge Instructions: Apply AandD Ointment as directed Prim Dressing: Silvercel Small 2x2 (in/in) 1 x Per Week/30 Days ary Discharge Instructions: Apply Silvercel Small 2x2 (in/in) as instructed Secondary Dressing: Zetuvit Plus 4x8 (in/in) 1 x Per Week/30 Days Compression Wrap: Urgo K2, two layer compression system, regular 1 x  Per Week/30 Days Wound #22 - Lower Leg Wound Laterality: Left, Lateral, Proximal Cleanser: Soap and Water 1 x Per Week/30 Days Discharge Instructions: Gently cleanse wound with antibacterial soap, rinse and pat dry prior to dressing wounds Cleanser: Wound Cleanser 1 x Per Week/30 Days Discharge Instructions: Wash your hands with soap and water. Remove old dressing, discard into plastic bag and place into trash. Cleanse the wound with Wound Cleanser prior to applying [name] clean dressing using gauze sponges, not tissues or cotton balls. Do not scrub or use excessive force. Pat dry using gauze sponges, not tissue or cotton balls. Peri-Wound Care: AandD Ointment 1 x Per Week/30 Days Discharge Instructions: Apply AandD Ointment as directed Prim Dressing: Silvercel Small 2x2 (in/in) 1 x Per Week/30 Days ary Discharge Instructions: Apply Silvercel Small 2x2 (in/in) as instructed Secondary Dressing: Zetuvit Plus 4x8 (in/in) 1 x Per Week/30 Days Compression Wrap: Urgo K2, two layer compression system, regular 1 x Per [address] LAFE, CLERK R ([id number]) [id number] Page 2 of 2 Electronic Signature(s) Signed: 10/21/2023 4:25:58 PM By: [name] MSN RN CNS WTA Signed: 10/21/2023 4:43:18 PM By: Bethena Andre PA-C Entered By: [name] on 10/21/2023 16:25:57 -------------------------------------------------------------------------------- SuperBill Details Patient Name: Date of Service: [name], [name] LBERT R. 10/21/2023 Medical Record Number: [id number] Patient Account Number: [id number] Date of Birth/Sex: Treating RN: 1946/06/23 (77 y.o. [name] Primary Care Provider: Gasper Golas Other Clinician: Referring Provider: Treating Provider/Extender: Bethena Andre Gasper Golas Devra in Treatment: 22 Diagnosis Coding ICD-10 Codes Code Description I89.0 Lymphedema, not elsewhere classified I87.333 Chronic venous hypertension (idiopathic) with ulcer and inflammation  of bilateral lower extremity L97.822 Non-pressure chronic ulcer of other part of left lower leg with fat layer exposed I73.89 Other specified peripheral vascular diseases I48.0 Paroxysmal atrial fibrillation I10 Essential (  primary) hypertension Facility Procedures : CPT4 Code: [id number] Description: (Facility Use Only) [phone] - APPLY MULTLAY COMPRS LWR LT LEG Modifier: Quantity: 1 Electronic Signature(s) Signed: 10/21/2023 4:26:45 PM By: [name] MSN RN CNS WTA Signed: 10/21/2023 4:43:18 PM By: Bethena Ferraris PA-C Entered By: [name] on 10/21/2023 16:26:44

## 2023-10-23 NOTE — Progress Notes (Signed)
 BRAEDON, SJOGREN [name] ([id number]) [id number] Page 1 of 5 Visit Report for 10/21/2023 Arrival Information Details Patient Name: Date of Service: [name] 10/21/2023 11:00 A M Medical Record Number: [id number] Patient Account Number: [id number] Date of Birth/Sex: Treating RN: 10-10-1945 (78 y.o. [name] Primary Care Jaspreet Bodner: Gasper Golas Other Clinician: Referring Zacharey Jensen: Treating Loryn Haacke/Extender: Bethena Andre Gasper Golas Devra in Treatment: 22 Visit Information History Since Last Visit Added or deleted any medications: No Patient Arrived: Ambulatory Any new allergies or adverse reactions: No Arrival Time: 11:30 Had a fall or experienced change in No Accompanied By: self activities of daily living that may affect Transfer Assistance: None risk of falls: Patient Identification Verified: Yes Signs or symptoms of abuse/neglect since last visito No Secondary Verification Process Completed: Yes Has Dressing in Place as Prescribed: Yes Patient Requires Transmission-Based Precautions: No Has Compression in Place as Prescribed: Yes Patient Has Alerts: No Pain Present Now: No Electronic Signature(s) Signed: 10/21/2023 4:23:54 PM By: [name] MSN RN CNS WTA Entered By: [name] on 10/21/2023 16:23:54 -------------------------------------------------------------------------------- Compression Therapy Details Patient Name: Date of Service: [name] [name], [name] 10/21/2023 11:00 A M Medical Record Number: [id number] Patient Account Number: [id number] Date of Birth/Sex: Treating RN: 03/11/1946 (78 y.o. [name] Primary Care Everest Brod: Gasper Golas Other Clinician: Referring Janece Laidlaw: Treating Dhruv Christina/Extender: Bethena Andre Gasper Golas Devra in Treatment: 22 Compression Therapy Performed for Wound Assessment: Wound #21R Left,Distal,Lateral Lower Leg Performed By: Clinician [name], RN Compression Type: Four  Layer Electronic Signature(s) Signed: 10/21/2023 4:24:47 PM By: [name] MSN RN CNS WTA Entered By: [name] on 10/21/2023 16:24:47 [name] ([id number]) [id number] Page 2 of 5 -------------------------------------------------------------------------------- Compression Therapy Details Patient Name: Date of Service: [name] 10/21/2023 11:00 A M Medical Record Number: [id number] Patient Account Number: [id number] Date of Birth/Sex: Treating RN: 01-13-1946 (78 y.o. [name] Primary Care Staisha Winiarski: Gasper Golas Other Clinician: Referring Salwa Bai: Treating Cordera Stineman/Extender: Bethena Andre Gasper Golas Devra in Treatment: 22 Compression Therapy Performed for Wound Assessment: Wound #22 Left,Proximal,Lateral Lower Leg Performed By: Clinician [name], RN Compression Type: Four Layer Electronic Signature(s) Signed: 10/21/2023 4:24:47 PM By: [name] MSN RN CNS WTA Entered By: [name] on 10/21/2023 16:24:47 -------------------------------------------------------------------------------- Encounter Discharge Information Details Patient Name: Date of Service: [name] [name], [name] 10/21/2023 11:00 A M Medical Record Number: [id number] Patient Account Number: [id number] Date of Birth/Sex: Treating RN: [birth year] (78 y.o. [name] Primary Care Larita Deremer: Gasper Golas Other Clinician: Referring Mikaia Janvier: Treating Ryer Asato/Extender: Bethena Andre Gasper Golas Devra in Treatment: 22 Encounter Discharge Information Items Discharge Condition: Stable Ambulatory Status: Ambulatory Discharge Destination: Home Transportation: Private Auto Accompanied By: self Schedule Follow-up Appointment: Yes Clinical Summary of Care: Electronic Signature(s) Signed: 10/21/2023 4:26:24 PM By: [name] MSN RN CNS WTA Entered By: [name] on 10/21/2023 83:73:75 [name] ([id number]) [id number]  Page 3 of 5 -------------------------------------------------------------------------------- Wound Assessment Details Patient Name: Date of Service: [name] 10/21/2023 11:00 A M Medical Record Number: [id number] Patient Account Number: [id number] Date of Birth/Sex: Treating RN: [birth year] (78 y.o. [name] Primary Care Lamisha Roussell: Gasper Golas Other Clinician: Referring Lynnlee Revels: Treating Halayna Blane/Extender: Bethena Andre Gasper Golas Devra in Treatment: 22 Wound Status Wound Number: 21R Primary Etiology: Vasculitis Wound Location: Left, Distal, Lateral Lower Leg Wound Status: Open Wounding Event: Gradually Appeared Date Acquired: 04/08/2023 Weeks Of Treatment: 22 Clustered Wound: No Wound Measurements Length: (cm) 1.2 Width: (cm)  1 Depth: (cm) 0.1 Area: (cm) 0.942 Volume: (cm) 0.094 % Reduction in Area: 93.1% % Reduction in Volume: 93.2% Wound Description Classification: Full Thickness Without Exposed Support S Exudate Amount: None Present tructures Treatment Notes Wound #21R (Lower Leg) Wound Laterality: Left, Lateral, Distal Cleanser Soap and Water Discharge Instruction: Gently cleanse wound with antibacterial soap, rinse and pat dry prior to dressing wounds Wound Cleanser Discharge Instruction: Wash your hands with soap and water. Remove old dressing, discard into plastic bag and place into trash. Cleanse the wound with Wound Cleanser prior to applying a clean dressing using gauze sponges, not tissues or cotton balls. Do not scrub or use excessive force. Pat dry using gauze sponges, not tissue or cotton balls. Peri-Wound Care AandD Ointment Discharge Instruction: Apply AandD Ointment as directed Topical Primary Dressing Silvercel Small 2x2 (in/in) Discharge Instruction: Apply Silvercel Small 2x2 (in/in) as instructed Secondary Dressing Zetuvit Plus 4x8 (in/in) Secured With Compression Wrap Urgo K2, two layer compression system,  regular Compression Stockings Add-Ons Electronic Signature(s) Signed: 10/21/2023 4:49:55 PM By: [name] MSN RN CNS WTA [name],Signed: 10/21/2023 4:49:55 PM By: [name] MSN RN CNS [name] ([id number]) [id number] Page 4 of 5 Entered By: [name] on 10/21/2023 16:24:25 -------------------------------------------------------------------------------- Wound Assessment Details Patient Name: Date of Service: [name] 10/21/2023 11:00 A M Medical Record Number: [id number] Patient Account Number: [id number] Date of Birth/Sex: Treating RN: [birth year] (78 y.o. [name] Primary Care Sheneka Schrom: Gasper Golas Other Clinician: Referring Hairo Garraway: Treating Joanna Hall/Extender: Bethena Andre Gasper Golas Devra in Treatment: 22 Wound Status Wound Number: 22 Primary Etiology: Venous Leg Ulcer Wound Location: Left, Proximal, Lateral Lower Leg Wound Status: Open Wounding Event: Gradually Appeared Date Acquired: 09/09/2023 Weeks Of Treatment: 1 Clustered Wound: Yes Wound Measurements Length: (cm) 6 Width: (cm) 4 Depth: (cm) 0.1 Area: (cm) 18.85 Volume: (cm) 1.885 % Reduction in Area: -95.9% % Reduction in Volume: -95.9% Wound Description Classification: Full Thickness Without Exposed Suppor Exudate Amount: Medium Exudate Type: Sanguinous Exudate Color: red t Structures Treatment Notes Wound #22 (Lower Leg) Wound Laterality: Left, Lateral, Proximal Cleanser Soap and Water Discharge Instruction: Gently cleanse wound with antibacterial soap, rinse and pat dry prior to dressing wounds Wound Cleanser Discharge Instruction: Wash your hands with soap and water. Remove old dressing, discard into plastic bag and place into trash. Cleanse the wound with Wound Cleanser prior to applying a clean dressing using gauze sponges, not tissues or cotton balls. Do not scrub or use excessive force. Pat dry using gauze sponges, not tissue or cotton  balls. Peri-Wound Care AandD Ointment Discharge Instruction: Apply AandD Ointment as directed Topical Primary Dressing Silvercel Small 2x2 (in/in) Discharge Instruction: Apply Silvercel Small 2x2 (in/in) as instructed Secondary Dressing Zetuvit Plus 4x8 (in/in) Secured With Compression Wrap Urgo K2, two layer compression system, regular SAMAN, UMSTEAD [name] ([id number]) [id number] Page 5 of 5 Compression Stockings Add-Ons Electronic Signature(s) Signed: 10/21/2023 4:49:55 PM By: [name] MSN RN CNS WTA Entered By: [name] on 10/21/2023 16:24:25

## 2023-10-24 ENCOUNTER — Encounter: Payer: Medicare PPO | Admitting: Physician Assistant

## 2023-10-24 DIAGNOSIS — I480 Paroxysmal atrial fibrillation: Secondary | ICD-10-CM | POA: Diagnosis not present

## 2023-10-24 DIAGNOSIS — L958 Other vasculitis limited to the skin: Secondary | ICD-10-CM | POA: Diagnosis not present

## 2023-10-24 DIAGNOSIS — I10 Essential (primary) hypertension: Secondary | ICD-10-CM | POA: Diagnosis not present

## 2023-10-24 DIAGNOSIS — I890 Lymphedema, not elsewhere classified: Secondary | ICD-10-CM | POA: Diagnosis not present

## 2023-10-24 DIAGNOSIS — I87332 Chronic venous hypertension (idiopathic) with ulcer and inflammation of left lower extremity: Secondary | ICD-10-CM | POA: Diagnosis not present

## 2023-10-24 DIAGNOSIS — I428 Other cardiomyopathies: Secondary | ICD-10-CM | POA: Diagnosis not present

## 2023-10-24 DIAGNOSIS — I87333 Chronic venous hypertension (idiopathic) with ulcer and inflammation of bilateral lower extremity: Secondary | ICD-10-CM | POA: Diagnosis not present

## 2023-10-24 DIAGNOSIS — L97822 Non-pressure chronic ulcer of other part of left lower leg with fat layer exposed: Secondary | ICD-10-CM | POA: Diagnosis not present

## 2023-10-24 NOTE — Progress Notes (Addendum)
FODAY, BRADEN [name] ([id number]) [id number] Page 1 of 7 Visit Report for 10/24/2023 Chief Complaint Document Details Patient Name: Date of Service: [name] Clementeen [name] 10/24/2023 2:45 PM Medical Record Number: [id number] Patient Account Number: [id number] Date of Birth/Sex: Treating RN: [birth year] (78 y.o. [name] Primary Care Provider: [name] Other Clinician: Betha Loa Referring Provider: Treating Provider/Extender: [name] in Treatment: 23 Information Obtained from: Patient Chief Complaint Left LE Ulcer Electronic Signature(s) Signed: 10/24/2023 2:50:35 PM By: Allen Derry PA-C Entered By: Allen Derry on 10/24/2023 14:50:35 -------------------------------------------------------------------------------- HPI Details Patient Name: Date of Service: [name], [name] 10/24/2023 2:45 PM Medical Record Number: [id number] Patient Account Number: [id number] Date of Birth/Sex: Treating RN: 1946/04/16 (78 y.o. [name] Primary Care Provider: [name] Other Clinician: Betha Loa Referring Provider: Treating Provider/Extender: [name] in Treatment: 23 History of Present Illness HPI Description: Readmission: 05-16-2023 upon evaluation today patient presents for reevaluation here in the clinic concerning [name] wound on the left anterior/lateral lower extremity. This is similar to where the wound was last time I saw him but not exactly the same. Fortunately there does not appear to be any signs of active infection at this time which is great news. The patient's past medical history really has not changed significantly since last time I saw him. 10-24-2023 upon evaluation patient unfortunately appears to be doing [name] little worse in regard to his wound at this point. I do not see any signs of infection which is good news systemically though locally I do believe that this is indeed  infected. I do believe he is going require some antibiotic therapy currently. Electronic Signature(s) Signed: 10/24/2023 4:38:11 PM By: Allen Derry PA-C Entered By: Allen Derry on 10/24/2023 16:38:11 Meeker, Dahlgren [name] ([id number]) [id number] Page 2 of 7 -------------------------------------------------------------------------------- Physical Exam Details Patient Name: Date of Service: [name] 10/24/2023 2:45 PM Medical Record Number: [id number] Patient Account Number: [id number] Date of Birth/Sex: Treating RN: [birth year] (78 y.o. [name] Primary Care Provider: [name] Other Clinician: Betha Loa Referring Provider: Treating Provider/Extender: [name] in Treatment: 23 Constitutional Well-nourished and well-hydrated in no acute distress. Respiratory normal breathing without difficulty. Psychiatric this patient is able to make decisions and demonstrates good insight into disease process. Alert and Oriented x 3. pleasant and cooperative. Notes Upon inspection patient's wound bed actually showed signs of good granulation and epithelization in some areas he actually is healing although he has erythema and redness there is some cellulitis of the leg in general I think he is going require some antibiotics and he is in agreement with this plan. He has done well in the past with Bactrim I am going to see about getting started on the Bactrim. Electronic Signature(s) Signed: 10/24/2023 4:38:36 PM By: Allen Derry PA-C Entered By: Allen Derry on 10/24/2023 16:38:36 -------------------------------------------------------------------------------- Physician Orders Details Patient Name: Date of Service: [name], [name] 10/24/2023 2:45 PM Medical Record Number: [id number] Patient Account Number: [id number] Date of Birth/Sex: Treating RN: 01-22-1946 (78 y.o. 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 Nurse Visit as needed Bathing/ Shower/ Hygiene May shower with wound dressing protected with water repellent cover or cast protector. Edema Control - Orders / Instructions UrgoK2 Elevate, Exercise Daily and [name] void Standing for Long Periods of Time. Elevate leg(s) parallel to the floor when sitting. Medications-Please add to medication list. ntibiotics - start Bactrim DS as directed P.O. [name] Wound Treatment Wound #21R - Lower Leg Wound Laterality: Left, Lateral, Distal Cleanser: Soap and Water 1 x Per Week/30 Days Discharge Instructions: Gently cleanse wound with antibacterial soap, rinse and pat dry prior to dressing wounds Cleanser: Wound Cleanser 1 x Per Week/30 Days Discharge Instructions: Wash your hands with soap and water. Remove old dressing, discard into plastic bag and place into trash. Cleanse the wound with Wound Cleanser prior to applying [name] clean dressing using gauze sponges, not tissues or cotton balls. Do not scrub or use excessive force. Pat dry using gauze sponges, not tissue or cotton balls. Peri-Wound Care: AandD Ointment 1 x Per Week/30 Days Discharge  Instructions: Apply AandD Ointment as directed Prim Dressing: Silvercel Small 2x2 (in/in) 1 x Per Week/30 Days ary Discharge Instructions: Apply Silvercel Small 2x2 (in/in) as instructed Secondary Dressing: Zetuvit Plus 4x8 (in/in) 1 x Per Week/30 Days Compression Wrap: Urgo K2, two layer compression system, regular 1 x Per Week/30 Days Wound #22 - Lower Leg Wound Laterality: Left, Lateral, Proximal Cleanser: Soap and Water 1 x Per Week/30 Days Discharge Instructions: Gently cleanse wound with antibacterial soap, rinse and pat dry prior to dressing wounds Cleanser: Wound Cleanser 1 x Per Week/30 Days Discharge Instructions: Wash your hands with soap and water. Remove old dressing, discard into plastic bag and place into trash. Cleanse the wound with Wound Cleanser prior to applying [name] clean dressing using gauze sponges, not tissues or cotton balls. Do not scrub or use excessive force. Pat dry using gauze sponges, not tissue or cotton balls. Peri-Wound Care: AandD Ointment 1 x Per Week/30 Days Discharge Instructions: Apply AandD Ointment as directed Prim Dressing: Silvercel Small 2x2 (in/in) 1 x Per Week/30 Days ary Discharge Instructions: Apply Silvercel Small 2x2 (in/in) as instructed Secondary Dressing: Zetuvit Plus 4x8 (in/in) 1 x Per Week/30 Days Compression Wrap: Urgo K2, two layer compression system, regular 1 x Per Week/30 Days Patient Medications llergies: No Known Allergies [name] Notifications Medication Indication Start End 10/24/2023 Bactrim DS DOSE 1 - oral 800 mg-160 mg tablet - 1 tablet oral twice [name] day x 14 days. Do not take potassium while on this medication Electronic Signature(s) Signed: 10/24/2023 3:47:13 PM By: Allen Derry PA-C Entered By: Allen Derry on 10/24/2023 15:47:12 [place], [place] [name] ([id number]) [phone].pdf Page 4 of 7 -------------------------------------------------------------------------------- Problem List Details Patient Name:  Date of Service: [name] Clementeen [name] 10/24/2023 2:45 PM Medical Record Number: [id number] Patient Account Number: [id number] Date of Birth/Sex: Treating RN: 01/02/1946 (78 y.o. [name] Primary Care Provider: [name] Other Clinician: Betha Loa Referring Provider: Treating Provider/Extender: [name] in Treatment: 23 Active Problems ICD-10 Encounter Code Description Active Date MDM Diagnosis I89.0 Lymphedema, not elsewhere classified 05/16/2023 No Yes I87.333 Chronic venous hypertension (idiopathic) with ulcer and inflammation of 05/16/2023 No Yes bilateral lower extremity L97.822 Non-pressure chronic ulcer  of other part of left lower leg with fat layer exposed8/05/2023 No Yes I73.89 Other specified peripheral vascular diseases 05/16/2023 No Yes I48.0 Paroxysmal atrial fibrillation 05/16/2023 No Yes I10 Essential (primary) hypertension 05/16/2023 No Yes Inactive Problems Resolved Problems Electronic Signature(s) Signed: 10/24/2023 2:50:27 PM By: Allen Derry PA-C Entered By: Allen Derry on 10/24/2023 14:50:27 [name] ([id number]) [phone].pdf Page 5 of 7 -------------------------------------------------------------------------------- Progress Note Details Patient Name: Date of Service: [name] 10/24/2023 2:45 PM Medical Record Number: [id number] Patient Account Number: [id number] Date of Birth/Sex: Treating RN: February 01, 1946 (78 y.o. [name] Primary Care Provider: [name] Other Clinician: Betha Loa Referring Provider: Treating Provider/Extender: [name] in Treatment: 23 Subjective Chief Complaint Information obtained from Patient Left LE Ulcer History of Present Illness (HPI) Readmission: 05-16-2023 upon evaluation today patient presents for reevaluation here in the clinic concerning [name] wound on the left anterior/lateral lower extremity. This is  similar to where the wound was last time I saw him but not exactly the same. Fortunately there does not appear to be any signs of active infection at this time which is great news. The patient's past medical history really has not changed significantly since last time I saw him. 10-24-2023 upon evaluation patient unfortunately appears to be doing [name] little worse in regard to his wound at this point. I do not see any signs of infection which is good news systemically though locally I do believe that this is indeed infected. I do believe he is going require some antibiotic therapy currently. Objective Constitutional Well-nourished and well-hydrated in no acute distress. Vitals Time Taken: 2:48 PM, Height: 75 in, Weight: 220 lbs, BMI: 27.5, Temperature: 97.7 F, Pulse: 63 bpm, Respiratory Rate: 18 breaths/min, Blood Pressure: 168/87 mmHg. Respiratory normal breathing without difficulty. Psychiatric this patient is able to make decisions and demonstrates good insight into disease process. Alert and Oriented x 3. pleasant and cooperative. General Notes: Upon inspection patient's wound bed actually showed signs of good granulation and epithelization in some areas he actually is healing although he has erythema and redness there is some cellulitis of the leg in general I think he is going require some antibiotics and he is in agreement with this plan. He has done well in the past with Bactrim I am going to see about getting started on the Bactrim. Integumentary (Hair, Skin) Wound #21R status is Open. Original cause of wound was Gradually Appeared. The date acquired was: 04/08/2023. The wound has been in treatment 23 weeks. The wound is located on the Left,Distal,Lateral Lower Leg. The wound measures 4.8cm length x 5cm width x 0.1cm depth; 18.85cm^2 area and 1.885cm^3 volume. There is [name] none present amount of drainage noted. Wound #22 status is Open. Original cause of wound was Gradually Appeared. The date  acquired was: 09/09/2023. The wound has been in treatment 2 weeks. The wound is located on the Left,Proximal,Lateral Lower Leg. The wound measures 9.5cm length x 9.5cm width x 0.1cm depth; 70.882cm^2 area and 7.088cm^3 volume. There is [name] medium amount of sanguinous drainage noted. 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  There was [name] Double Layer Compression Therapy Procedure by Betha Loa. Post procedure Diagnosis Wound #21R: Same as Pre-Procedure Wound #22 Pre-procedure diagnosis of Wound #22 is [name] Venous Leg Ulcer located on the Left,Proximal,Lateral Lower Leg . There was [name] Double Layer Compression Therapy Procedure by Betha Loa. Post procedure Diagnosis Wound #22: Same as Pre-Procedure Plan Follow-up Appointments: Return Appointment in 1 week. Nurse Visit as needed Bathing/ Shower/ Hygiene: May shower with wound dressing protected with water repellent cover or cast protector. Edema Control - Orders / Instructions: UrgoK2 Elevate, Exercise Daily and Avoid Standing for Long Periods of Time. Elevate leg(s) parallel to the floor when sitting. Medications-Please add to medication list.: P.O. Antibiotics - start Bactrim DS as directed The following medication(s) was prescribed: Bactrim DS oral 800 mg-160 mg tablet 1 1 tablet oral twice [name] day x 14 days. Do not take potassium while on this medication starting 10/24/2023 WOUND #21R: - Lower Leg Wound  Laterality: Left, Lateral, Distal Cleanser: Soap and Water 1 x Per Week/30 Days Discharge Instructions: Gently cleanse wound with antibacterial soap, rinse and pat dry prior to dressing wounds Cleanser: Wound Cleanser 1 x Per Week/30 Days Discharge Instructions: Wash your hands with soap and water. Remove old dressing, discard into plastic bag and place into trash. Cleanse the wound with Wound Cleanser prior to applying [name] clean dressing using gauze sponges, not tissues or cotton balls. Do not scrub or use excessive force. Pat dry using gauze sponges, not tissue or cotton balls. Peri-Wound Care: AandD Ointment 1 x Per Week/30 Days Discharge Instructions: Apply AandD Ointment as directed Prim Dressing: Silvercel Small 2x2 (in/in) 1 x Per Week/30 Days ary Discharge Instructions: Apply Silvercel Small 2x2 (in/in) as instructed Secondary Dressing: Zetuvit Plus 4x8 (in/in) 1 x Per Week/30 Days Com pression Wrap: Urgo K2, two layer compression system, regular 1 x Per Week/30 Days WOUND #22: - Lower Leg Wound Laterality: Left, Lateral, Proximal Cleanser: Soap and Water 1 x Per Week/30 Days Discharge Instructions: Gently cleanse wound with antibacterial soap, rinse and pat dry prior to dressing wounds Cleanser: Wound Cleanser 1 x Per Week/30 Days Discharge Instructions: Wash your hands with soap and water. Remove old dressing, discard into plastic bag and place into trash. Cleanse the wound with Wound Cleanser prior to applying [name] clean dressing using gauze sponges, not tissues or cotton balls. Do not scrub or use excessive force. Pat dry using gauze sponges, not tissue or cotton balls. Peri-Wound Care: AandD Ointment 1 x Per Week/30 Days Discharge Instructions: Apply AandD Ointment as directed Prim Dressing: Silvercel Small 2x2 (in/in) 1 x Per Week/30 Days ary Discharge Instructions: Apply Silvercel Small 2x2 (in/in) as instructed Secondary Dressing: Zetuvit Plus 4x8 (in/in) 1 x Per Week/30  Days Com pression Wrap: Urgo K2, two layer compression system, regular 1 x Per Week/30 Days 1. I am going to recommend currently that we have the patient going and continue to monitor for any signs of infection or worsening and the patient is in agreement with plan. This includes the use of the AandD ointment followed by the silver alginate dressing we are going to use oil emulsion over the areas where the largest wounds are to keep this from sticking. 2. I am going to recommend as well the patient should continue to monitor for any signs of infection or worsening if anything changes he knows contact the office let me know. 3. I did go ahead and send in [name] prescription for Bactrim DS which I think is  going to do well for him he is doing excellent with this in the past. We will see patient back for reevaluation in 1 week here in the clinic. If anything worsens or changes patient will contact our office for additional recommendations. Electronic Signature(s) Signed: 10/24/2023 4:39:55 PM By: Allen Derry PA-C Entered By: Allen Derry on 10/24/2023 16:39:54 [name] ([id number]) [id number] Page 7 of 7 -------------------------------------------------------------------------------- SuperBill Details Patient Name: Date of Service: [name] Clementeen [name] 10/24/2023 Medical Record Number: [id number] Patient Account Number: [id number] Date of Birth/Sex: Treating RN: September 15, 1946 (78 y.o. 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 I73.89 Other  specified peripheral vascular diseases I48.0 Paroxysmal atrial fibrillation I10 Essential (primary) hypertension Facility Procedures : CPT4 Code: 02725366 Description: (Facility Use Only) 29581LT - APPLY MULTLAY COMPRS LWR LT LEG Modifier: Quantity: 1 Physician Procedures : CPT4: Description Modifier Code [id number] - WC PHYS LEVEL 4 - EST PT ICD-10 Diagnosis Description I89.0 Lymphedema, not elsewhere classified 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  L97.822 Non-pressure chronic ulcer of other part of left lower leg with fat layer exposed I73.89 Other specified peripheral vascular diseases Quantity: 1 : CPT4: Q5956 Visit complexity inherent to [facility] w/medical care services that serve as the continuing focal point for ongoing care related to [name] patient's condition ICD-10 Diagnosis Description I89.0 Lymphedema, not elsewhere classified I87.333  Chronic venous hypertension (idiopathic) with ulcer and inflammation of bilateral lower extremity L97.822 Non-pressure chronic ulcer of other part of left lower leg with fat layer exposed I73.89 Other specified peripheral vascular diseases Quantity: 1 Electronic Signature(s) Signed: 10/24/2023 4:40:31 PM By: Allen Derry PA-C Previous Signature: 10/24/2023 4:38:07 PM Version By: Betha Loa Entered By: Allen Derry on 10/24/2023 16:40:31

## 2023-10-24 NOTE — Progress Notes (Signed)
[name], [name] ([id number]) [id number] Page 1 of 11 Visit Report for 10/24/2023 Arrival Information Details Patient Name: Date of Service: [name] 10/24/2023 2:45 PM Medical Record Number: [id number] Patient Account Number: [id number] Date of Birth/Sex: Treating RN: [birth year] (78 y.o. [name] Primary Care Calton Harshfield: [name] Other Clinician: Betha Loa Referring Jaicee Michelotti: Treating Kaseem Vastine/Extender: [name] in Treatment: 23 Visit Information History Since Last Visit All ordered tests and consults were completed: No Patient Arrived: Ambulatory Added or deleted any medications: No Arrival Time: 14:45 Any new allergies or adverse reactions: No Transfer Assistance: None Had [name] fall or experienced change in No Patient Identification Verified: Yes activities of daily living that may affect Secondary Verification Process Completed: Yes risk of falls: Patient Requires Transmission-Based Precautions: No Signs or symptoms of abuse/neglect since last visito No Patient Has Alerts: No Hospitalized since last visit: No Implantable device outside of the clinic excluding No cellular tissue based products placed in the center since last visit: Has Dressing in Place as Prescribed: Yes Has Compression in Place as Prescribed: Yes Pain Present Now: No Electronic Signature(s) Signed: 10/24/2023 4:38:07 PM By: Betha Loa Entered By: Betha Loa on 10/24/2023 14:48:42 -------------------------------------------------------------------------------- Clinic Level of Care Assessment Details Patient Name: Date of Service: [name] [name] 10/24/2023 2:45 PM Medical Record Number: [id number] Patient Account Number: [id number] Date of Birth/Sex: Treating RN: 03/31/1946 (78 y.o. [name] Primary Care Ernisha Sorn: [name] Other Clinician: Betha Loa Referring Arelis Neumeier: Treating  Ulmer Degen/Extender: [name] in Treatment: 23 Clinic Level of Care Assessment Items TOOL 1 Quantity Score []  - 0 Use when EandM and Procedure is performed on INITIAL visit ASSESSMENTS - Nursing Assessment / Reassessment []  - 0 General Physical Exam (combine w/ comprehensive assessment (listed just below) when performed on new pt. [address] MITCHEL, BLACHE [name] [id number] Page 2 of 11 []  - 0 Comprehensive Assessment (HX, ROS, Risk Assessments, Wounds Hx, etc.) ASSESSMENTS - Wound and Skin Assessment / Reassessment []  - 0 Dermatologic / Skin Assessment (not related to wound area) ASSESSMENTS - Ostomy and/or Continence Assessment and Care []  - 0 Incontinence Assessment and Management []  - 0 Ostomy Care Assessment and Management (repouching, etc.) PROCESS - Coordination of Care []  - 0 Simple Patient / Family Education for ongoing care []  - 0 Complex (extensive) Patient / Family Education for ongoing care []  - 0 Staff obtains Chiropractor, Records, T Results / Process Orders est []  - 0 Staff telephones HHA, Nursing Homes / Clarify orders / etc []  - 0 Routine Transfer to another Facility (non-emergent condition) []  - 0 Routine Hospital Admission (non-emergent condition) []  - 0 New Admissions / Manufacturing engineer / Ordering NPWT Apligraf, etc. , []  - 0 Emergency Hospital Admission (emergent condition) PROCESS - Special Needs []  - 0 Pediatric / Minor Patient Management []  - 0 Isolation Patient Management []  - 0 Hearing / Language / Visual special needs []  - 0 Assessment of Community assistance (transportation, [name], etc.) []  - 0 Additional assistance / Altered mentation []  - 0 Support Surface(s) Assessment (bed, cushion, seat, etc.) INTERVENTIONS - Miscellaneous []  - 0 External ear exam []  - 0 Patient Transfer (multiple staff / Nurse, adult / Similar devices) []  - 0 Simple Staple / Suture removal (25 or  less) []  - 0 Complex Staple / Suture removal (26 or more) []  - 0 Hypo/Hyperglycemic Management (do not check if billed separately) []  - 0 Ankle / Brachial Index (ABI) -  do not check if billed separately Has the patient been seen at the hospital within the last three years: Yes Total Score: 0 Level Of Care: ____ Electronic Signature(s) Signed: 10/24/2023 4:38:07 PM By: Betha Loa Entered By: Betha Loa on 10/24/2023 15:38:38 -------------------------------------------------------------------------------- Compression Therapy Details Patient Name: Date of Service: [name], [name] 10/24/2023 2:45 PM Medical Record Number: [id number] Patient Account Number: [id number] Date of Birth/Sex: Treating RN: Mar 27, 1946 (78 y.o. [name] Primary Care Macklyn Glandon: [name] Other Clinician: Mataio, Dilello [name] ([id number]) [id number] Page 3 of 11 Referring Rojean Ige: Treating Ariyonna Twichell/Extender: [name] in Treatment: 23 Compression Therapy Performed for Wound Assessment: Wound #22 Left,Proximal,Lateral Lower Leg Performed By: Farrel Gordon, Angie, Compression Type: Double Layer Post Procedure Diagnosis Same as Pre-procedure Electronic Signature(s) Signed: 10/24/2023 4:38:07 PM By: Betha Loa Entered By: Betha Loa on 10/24/2023 15:38:08 -------------------------------------------------------------------------------- Compression Therapy Details Patient Name: Date of Service: [name], [name] 10/24/2023 2:45 PM Medical Record Number: [id number] Patient Account Number: [id number] Date of Birth/Sex: Treating RN: [birth year] (78 y.o. [name] Primary Care Taran Hable: [name] Other Clinician: Betha Loa Referring Jeiry Birnbaum: Treating Shalane Florendo/Extender: [name] in Treatment: 23 Compression Therapy Performed for Wound Assessment: Wound #21R Left,Distal,Lateral  Lower Leg Performed By: Farrel Gordon, Angie, Compression Type: Double Layer Post Procedure Diagnosis Same as Pre-procedure Electronic Signature(s) Signed: 10/24/2023 4:38:07 PM By: Betha Loa Entered By: Betha Loa on 10/24/2023 15:38:08 -------------------------------------------------------------------------------- Encounter Discharge Information Details Patient Name: Date of Service: [name], [name] 10/24/2023 2:45 PM Medical Record Number: [id number] Patient Account Number: [id number] Date of Birth/Sex: Treating RN: 1946-01-28 (79 y.o. [name] Primary Care Jezabelle Chisolm: [name] Other Clinician: Betha Loa Referring Calista Crain: Treating Taiyana Kissler/Extender: [name] in Treatment: 23 Encounter Discharge Information Items Discharge Condition: Stable Ambulatory Status: Ambulatory Discharge Destination: Home [name], [name] ([id number]) [id number] Page 4 of 11 Transportation: Private Auto Accompanied By: self Schedule Follow-up Appointment: Yes Clinical Summary of Care: Electronic Signature(s) Signed: 10/24/2023 4:38:07 PM By: Betha Loa Entered By: Betha Loa on 10/24/2023 16:36:21 -------------------------------------------------------------------------------- Lower Extremity Assessment Details Patient Name: Date of Service: [name] [name] 10/24/2023 2:45 PM Medical Record Number: [id number] Patient Account Number: [id number] Date of Birth/Sex: Treating RN: [birth year] (78 y.o. [name] Primary Care Cassell Voorhies: [name] Other Clinician: Betha Loa Referring Elisia Stepp: Treating Shamyah Stantz/Extender: [name] in Treatment: 23 Edema Assessment Assessed: Kyra Searles: Yes] Franne Forts: No] Edema: [Left: Ye] [Right: s] Calf Left: Right: Point of Measurement: 374 cm From Medial Instep 34.5 cm Ankle Left: Right: Point of Measurement: 12 cm From Medial  Instep 23 cm Vascular Assessment Pulses: Dorsalis Pedis Palpable: [Left:Yes] Extremity colors, hair growth, and conditions: Extremity Color: [Left:Red] Hair Growth on Extremity: [Left:Yes] Temperature of Extremity: [Left:Hot < 3 seconds] Toe Nail Assessment Left: Right: Thick: No Discolored: No Deformed: No Improper Length and Hygiene: No Electronic Signature(s) Signed: 10/24/2023 4:05:16 PM By: Angelina Pih Signed: 10/24/2023 4:38:07 PM By: Betha Loa Entered By: Betha Loa on 10/24/2023 15:03:18 [name] ([id number]) [id number] Page 5 of 11 -------------------------------------------------------------------------------- Multi Wound Chart Details Patient Name: Date of Service: [name] 10/24/2023 2:45 PM Medical Record Number: [id number] Patient Account Number: [id number] Date of Birth/Sex: Treating RN: 1945-12-27 (78 y.o. [name] Primary Care Lillith Mcneff: [name] Other Clinician: Betha Loa Referring Maurianna Benard: Treating Torin Whisner/Extender: [name] in Treatment: 23 Vital Signs Height(in):  75 Pulse(bpm): 63 Weight(lbs): 220 Blood Pressure(mmHg): 168/87 Body Mass Index(BMI): 27.5 Temperature(F): 97.7 Respiratory Rate(breaths/min): 18 [21R:Photos:] [N/[name]:N/[name]] Left, Distal, Lateral Lower Leg Left, Proximal, Lateral Lower Leg N/[name] Wound Location: Gradually Appeared Gradually Appeared N/[name] Wounding Event: Vasculitis Venous Leg Ulcer N/[name] Primary Etiology: Arrhythmia, Hypertension, Gout Arrhythmia, Hypertension, Gout N/[name] Comorbid History: 04/08/2023 09/09/2023 N/[name] Date Acquired: 23 2 N/[name] Weeks of Treatment: Open Open N/[name] Wound Status: No No N/[name] Wound Recurrence: No Yes N/[name] Clustered Wound: 4.8x5x0.1 9.5x9.5x0.1 N/[name] Measurements L x W x D (cm) 18.85 70.882 N/[name] [name] (cm) : rea 1.885 7.088 N/[name] Volume (cm) : -37.20% -636.70% N/[name] % Reduction in [name] rea: -37.20% -636.80% N/[name] %  Reduction in Volume: Full Thickness Without Exposed Full Thickness Without Exposed N/[name] Classification: Support Structures Support Structures None Present Medium N/[name] Exudate Amount: N/[name] Sanguinous N/[name] Exudate Type: N/[name] red N/[name] Exudate Color: Treatment Notes Electronic Signature(s) Signed: 10/24/2023 4:38:07 PM By: Betha Loa Entered By: Betha Loa on 10/24/2023 15:03:22 [name] ([id number]) [id number] Page 6 of 11 -------------------------------------------------------------------------------- Multi-Disciplinary Care Plan Details Patient Name: Date of Service: [name] 10/24/2023 2:45 PM Medical Record Number: [id number] Patient Account Number: [id number] Date of Birth/Sex: Treating RN: November 30, 1945 (78 y.o. [name] Primary Care Marris Frontera: [name] Other Clinician: Betha Loa Referring Kole Hilyard: Treating Christo Hain/Extender: [name] in Treatment: 23 Active Inactive Wound/Skin Impairment Nursing Diagnoses: Knowledge deficit related to ulceration/compromised skin integrity Goals: Patient/caregiver will verbalize understanding of skin care regimen Date Initiated: 05/16/2023 Target Resolution Date: 11/08/2023 Goal Status: Active Ulcer/skin breakdown will have [name] volume reduction of 30% by week 4 Date Initiated: 05/16/2023 Date Inactivated: 06/20/2023 Target Resolution Date: 06/16/2023 Goal Status: Met Ulcer/skin breakdown will have [name] volume reduction of 50% by week 8 Date Initiated: 05/16/2023 Date Inactivated: 07/17/2023 Target Resolution Date: 07/16/2023 Goal Status: Met Ulcer/skin breakdown will have [name] volume reduction of 80% by week 12 Date Initiated: 05/16/2023 Date Inactivated: 07/17/2023 Target Resolution Date: 08/16/2023 Goal Status: Met Ulcer/skin breakdown will heal within 14 weeks Date Initiated: 05/16/2023 Date Inactivated: 07/17/2023 Target Resolution Date: 09/15/2023 Goal Status:  Met Interventions: Assess patient/caregiver ability to obtain necessary supplies Assess patient/caregiver ability to perform ulcer/skin care regimen upon admission and as needed Assess ulceration(s) every visit Notes: Electronic Signature(s) Signed: 10/24/2023 4:05:16 PM By: Angelina Pih Signed: 10/24/2023 4:38:07 PM By: Betha Loa Entered By: Betha Loa on 10/24/2023 15:38:53 -------------------------------------------------------------------------------- Pain Assessment Details Patient Name: Date of Service: [name] [name], [name] 10/24/2023 2:45 PM [name], [name] ([id number]) [id number] Page 7 of 11 Medical Record Number: [id number] Patient Account Number: [id number] Date of Birth/Sex: Treating RN: November 01, 1945 (78 y.o. [name] Primary Care Marsha Hillman: [name] Other Clinician: Betha Loa Referring Sarahi Borland: Treating Dravyn Severs/Extender: [name] in Treatment: 23 Active Problems Location of Pain Severity and Description of Pain Patient Has Paino No Site Locations Pain Management and Medication Current Pain Management: Electronic Signature(s) Signed: 10/24/2023 4:05:16 PM By: Angelina Pih Signed: 10/24/2023 4:38:07 PM By: Betha Loa Entered By: Betha Loa on 10/24/2023 14:52:05 -------------------------------------------------------------------------------- Patient/Caregiver Education Details Patient Name: Date of Service: [name] 1/16/2025andnbsp2:45 PM Medical Record Number: [id number] Patient Account Number: [id number] Date of Birth/Gender: Treating RN: [birth year] (78 y.o. [name] Primary Care Physician: [name] Other Clinician: Betha Loa Referring Physician: Treating Physician/Extender: [name] in Treatment: 23 Education Assessment Education Provided To: Patient Education Topics Provided Wound/Skin  Impairment: Handouts: Other: continue wound care  as directed Methods: Explain/Verbal Responses: State content correctly NAZARETH, OLIVA [name] ([id number]) [id number] Page 8 of 11 Electronic Signature(s) Signed: 10/24/2023 4:38:07 PM By: Betha Loa Entered By: Betha Loa on 10/24/2023 15:39:12 -------------------------------------------------------------------------------- Wound Assessment Details Patient Name: Date of Service: [name] [name] 10/24/2023 2:45 PM Medical Record Number: [id number] Patient Account Number: [id number] Date of Birth/Sex: Treating RN: 25-May-1946 (78 y.o. [name] Primary Care Tedra Coppernoll: [name] Other Clinician: Betha Loa Referring Milana Salay: Treating Korine Winton/Extender: [name] in Treatment: 23 Wound Status Wound Number: 21R Primary Etiology: Vasculitis Wound Location: Left, Distal, Lateral Lower Leg Wound Status: Open Wounding Event: Gradually Appeared Comorbid History: Arrhythmia, Hypertension, Gout Date Acquired: 04/08/2023 Weeks Of Treatment: 23 Clustered Wound: No Photos Wound Measurements Length: (cm) 4.8 Width: (cm) 5 Depth: (cm) 0.1 Area: (cm) 18.85 Volume: (cm) 1.885 % Reduction in Area: -37.2% % Reduction in Volume: -37.2% Wound Description Classification: Full Thickness Without Exposed Support Exudate Amount: None Present Structures Treatment Notes Wound #21R (Lower Leg) Wound Laterality: Left, Lateral, Distal Cleanser Soap and Water Discharge Instruction: Gently cleanse wound with antibacterial soap, rinse and pat dry prior to dressing wounds Wound Cleanser Discharge Instruction: Wash your hands with soap and water. Remove old dressing, discard into plastic bag and place into trash. Cleanse the wound with Wound Cleanser prior to applying [name] clean dressing using gauze sponges, not tissues or cotton balls. Do not scrub or use excessive force. Pat dry using  gauze sponges, not tissue or cotton balls. Peri-Wound Care [address] CARDON, TREVORROW [name] ([id number]) [id number] Page 9 of 11 Discharge Instruction: Apply AandD Ointment as directed Topical Primary Dressing Silvercel Small 2x2 (in/in) Discharge Instruction: Apply Silvercel Small 2x2 (in/in) as instructed Secondary Dressing Zetuvit Plus 4x8 (in/in) Secured With Compression Wrap Urgo K2, two layer compression system, regular Compression Stockings Add-Ons Electronic Signature(s) Signed: 10/24/2023 4:05:16 PM By: Angelina Pih Signed: 10/24/2023 4:38:07 PM By: Betha Loa Entered By: Betha Loa on 10/24/2023 15:01:38 -------------------------------------------------------------------------------- Wound Assessment Details Patient Name: Date of Service: [name], [name] 10/24/2023 2:45 PM Medical Record Number: [id number] Patient Account Number: [id number] Date of Birth/Sex: Treating RN: 17-Jun-1946 (78 y.o. [name] Primary Care Ilona Colley: [name] Other Clinician: Betha Loa Referring Twania Bujak: Treating Masiya Claassen/Extender: [name] in Treatment: 23 Wound Status Wound Number: 22 Primary Etiology: Venous Leg Ulcer Wound Location: Left, Proximal, Lateral Lower Leg Wound Status: Open Wounding Event: Gradually Appeared Comorbid History: Arrhythmia, Hypertension, Gout Date Acquired: 09/09/2023 Weeks Of Treatment: 2 Clustered Wound: Yes Photos Wound Measurements Length: (cm) 9.5 Width: (cm) 9.5 Depth: (cm) 0.1 Area: (cm) 70.882 Volume: (cm) 7.088 [name], [name] ([id number]) Wound Description Classification: Full Thickness Without Exposed Suppor Exudate Amount: Medium Exudate Type: Sanguinous Exudate Color: red t Structures % Reduction in Area: -636.7% % Reduction in Volume: -636.8% [id number] Page 10 of 11 Treatment Notes Wound #22 (Lower Leg) Wound Laterality:  Left, Lateral, Proximal Cleanser Soap and Water Discharge Instruction: Gently cleanse wound with antibacterial soap, rinse and pat dry prior to dressing wounds Wound Cleanser Discharge Instruction: Wash your hands with soap and water. Remove old dressing, discard into plastic bag and place into trash. Cleanse the wound with Wound Cleanser prior to applying [name] clean dressing using gauze sponges, not tissues or cotton balls. Do not scrub or use excessive force. Pat dry using gauze sponges, not tissue or cotton balls. Peri-Wound Care AandD Ointment Discharge Instruction: Apply AandD Ointment as directed Topical Primary Dressing Silvercel Small 2x2 (  in/in) Discharge Instruction: Apply Silvercel Small 2x2 (in/in) as instructed Secondary Dressing Zetuvit Plus 4x8 (in/in) Secured With Compression Wrap Urgo K2, two layer compression system, regular Compression Stockings Add-Ons Electronic Signature(s) Signed: 10/24/2023 4:05:16 PM By: Angelina Pih Signed: 10/24/2023 4:38:07 PM By: Betha Loa Entered By: Betha Loa on 10/24/2023 15:02:10 -------------------------------------------------------------------------------- Vitals Details Patient Name: Date of Service: [name], [name] 10/24/2023 2:45 PM Medical Record Number: [id number] Patient Account Number: [id number] Date of Birth/Sex: Treating RN: 09-25-1946 (78 y.o. [name] Primary Care Devetta Hagenow: [name] Other Clinician: Betha Loa Referring Rocio Roam: Treating Tarea Skillman/Extender: [name] in Treatment: 23 Vital Signs Time Taken: 14:48 Temperature (F): 97.7 Height (in): 75 Pulse (bpm): 63 Weight (lbs): 220 Respiratory Rate (breaths/min): 18 Body Mass Index (BMI): 27.5 Blood Pressure (mmHg): 168/87 Reference Range: 80 - 120 mg / dl [name], [name] ([id number]) [phone].pdf Page 11 of 11 Electronic Signature(s) Signed: 10/24/2023 4:38:07 PM By:  Betha Loa Entered By: Betha Loa on 10/24/2023 14:51:59

## 2023-10-28 ENCOUNTER — Ambulatory Visit: Payer: Medicare PPO

## 2023-10-28 NOTE — Progress Notes (Deleted)
10/30/2023  12:41 PM   [name] [birth year] [id number]  Referring provider: Malva Limes, MD [address] [place],  [place] [address]  Urological history 1. ED -Contributing factors of age, BPH, hypertension, CHF, testosterone deficiency, Peyronie's disease and prediabetes -IPP 2015  2. BPH with LU TS -PSA (10/2022) 0.4  No chief complaint on file.   HPI: [name]  is a 78 y.o. male presenting for his annual exam.   Previous records reviewed.     I PSS ***        Score:  1-7 Mild 8-19 Moderate 20-35 Severe  PMH: Past Medical History:  Diagnosis Date   Basal cell carcinoma of right lower extremity 03/24/2015   BPH with obstruction/lower urinary tract symptoms    Chronic atrial fibrillation (HCC)    Chronic heart failure with preserved ejection fraction (HFpEF) (HCC)    Dislocation of shoulder, anterior, right, closed 07/24/2014   Echocardiogram abnormal    2009 moderate to severely dilated left atrium   Erectile dysfunction    Essential hypertension    Gout    History of chicken pox    History of measles    Hyperglycemia    Hypogonadism in male    Nocturia    Peyronie's disease    Traumatic tear of right rotator cuff 07/24/2014   Venous stasis    chronic   Surgical History: Past Surgical History:  Procedure Laterality Date   CARDIAC CATHETERIZATION  03/28/2004   normal coronaries, reduced EF at 25-30% (Dr. Laurell Josephs)   CARDIOVERSION, TEE guided  03/29/2004   Dr. Laurell Josephs    COLONOSCOPY  05/28/2013   Dr. Shelle Iron   HERNIA REPAIR  age 80   KNEE SURGERY     LUMBAR FUSION  age 66   MOHS SURGERY Right 08/20/2019   ALA done by Katrine Coho, MD [facility]   MYOVIEW CARDIOVASCULAR STRESS TEST  07/2003   anterior wall thinning, LV systolic function depressed at 33%   PILONIDAL CYST EXCISION     Right Eyebrow Tumor Removed  age 73    SHOULDER ARTHROSCOPY WITH ROTATOR CUFF REPAIR AND SUBACROMIAL DECOMPRESSION Right  08/19/2014   Procedure: SHOULDER ARTHROSCOPY WITH ROTATOR CUFF REPAIR AND SUBACROMIAL DECOMPRESSION;  Surgeon: Nilda Simmer, MD;  Location: [facility];  Service: Orthopedics;  Laterality: Right;   TOTAL HIP ARTHROPLASTY Right 03/25/2006   Dr. Carmon Ginsberg. [place]   TRANSTHORACIC ECHOCARDIOGRAM  09/11/2022   LV EF 50 to 55%.  Normal function with no RWMA.  Unable to assess DD - Afib.  Mildly reduced RV function moderately enlarged RV and moderately elevated PAP.  Severe biatrial enlargement.  Mild MR.  Normal RAP.:   Home Medications:  Allergies as of 10/30/2023       Reactions   Benazepril    COUGH         Medication List        Accurate as of October 28, 2023 12:41 PM. If you have any questions, ask your nurse or doctor.          carvedilol 6.25 MG tablet Commonly known as: COREG Take 1 tablet (6.25 mg total) by mouth 2 (two) times daily.   chlorthalidone 25 MG tablet Commonly known as: HYGROTON TAKE ONE TABLET BY MOUTH ONCE A DAY   furosemide 20 MG tablet Commonly known as: LASIX TAKE ONE TABLET BY MOUTH ONCE A DAY   levothyroxine 50 MCG tablet Commonly known as: SYNTHROID TAKE ONE  TABLET BY MOUTH DAILY   multivitamin tablet Take 1 tablet by mouth daily.   potassium chloride SA 20 MEQ tablet Commonly known as: KLOR-CON M TAKE TWO TABLETS BY MOUTH ONCE A DAY   rosuvastatin 10 MG tablet Commonly known as: CRESTOR TAKE ONE TABLET BY MOUTH ONCE DAILY   valsartan 320 MG tablet Commonly known as: DIOVAN Take 1 tablet (320 mg total) by mouth daily.   Xarelto 20 MG Tabs tablet Generic drug: rivaroxaban TAKE ONE TABLET BY MOUTH DAILY WITH SUPPER       Allergies:  Allergies  Allergen Reactions   Benazepril     COUGH     Family History: Family History  Problem Relation Age of Onset   Hypertension Father    Prostate cancer Father    Bladder Cancer Father    Hypertension Mother    Stroke Maternal Grandfather    Diabetes Paternal Grandfather     Supraventricular tachycardia Brother    Arthritis Sister        RA   Diabetes Maternal Uncle        TYPE 2   Supraventricular tachycardia Child    Kidney disease Neg Hx    Heart attack Neg Hx    Colon cancer Neg Hx    Social History:  reports that he has never smoked. He has never used smokeless tobacco. He reports current alcohol use of about 4.0 standard drinks of alcohol per week. He reports that he does not use drugs.  Pertinent ROS in HPI  Physical Exam: There were no vitals taken for this visit.  Constitutional:  Well nourished. Alert and oriented, No acute distress. HEENT: [place] AT, moist mucus membranes.  Trachea midline, no masses. Cardiovascular: No clubbing, cyanosis, or edema. Respiratory: Normal respiratory effort, no increased work of breathing. GI: Abdomen is soft, non tender, non distended, no abdominal masses. Liver and spleen not palpable.  No hernias appreciated.  Stool sample for occult testing is not indicated.   GU: No CVA tenderness.  No bladder fullness or masses.  Patient with circumcised/uncircumcised phallus. ***Foreskin easily retracted***  Urethral meatus is patent.  No penile discharge. No penile lesions or rashes. Scrotum without lesions, cysts, rashes and/or edema.  Testicles are located scrotally bilaterally. No masses are appreciated in the testicles. Left and right epididymis are normal. Rectal: Patient with  normal sphincter tone. Anus and perineum without scarring or rashes. No rectal masses are appreciated. Prostate is approximately *** grams, *** nodules are appreciated. Seminal vesicles are normal. Skin: No rashes, bruises or suspicious lesions. Lymph: No cervical or inguinal adenopathy. Neurologic: Grossly intact, no focal deficits, moving all 4 extremities. Psychiatric: Normal mood and affect.   Laboratory Data: BMET    Component Value Date/Time   NA 144 02/15/2023 1147   K 4.4 02/15/2023 1147   CL 102 02/15/2023 1147   CO2 26 02/15/2023  1147   GLUCOSE 106 (H) 02/15/2023 1147   GLUCOSE 116 (H) 08/09/2022 1915   BUN 17 02/15/2023 1147   CREATININE 0.99 02/15/2023 1147   CALCIUM 10.1 02/15/2023 1147   EGFR 80 02/15/2023 1147   GFRNONAA >60 08/09/2022 1915  I have reviewed the labs.  Assessment & Plan:    1. BPH with LUTS -continue conservative management, avoiding bladder irritants and timed voiding's -Aged out of prostate cancer screening  2. ED -IPP in place  -Functioning properly  No follow-ups on file.  [facility] [address] [place], [place] [address] [phone]

## 2023-10-30 ENCOUNTER — Ambulatory Visit: Payer: Medicare PPO | Admitting: Urology

## 2023-10-30 DIAGNOSIS — N529 Male erectile dysfunction, unspecified: Secondary | ICD-10-CM

## 2023-10-30 DIAGNOSIS — N138 Other obstructive and reflux uropathy: Secondary | ICD-10-CM

## 2023-10-31 ENCOUNTER — Encounter: Payer: Medicare PPO | Admitting: Physician Assistant

## 2023-10-31 DIAGNOSIS — I87332 Chronic venous hypertension (idiopathic) with ulcer and inflammation of left lower extremity: Secondary | ICD-10-CM | POA: Diagnosis not present

## 2023-10-31 DIAGNOSIS — I10 Essential (primary) hypertension: Secondary | ICD-10-CM | POA: Diagnosis not present

## 2023-10-31 DIAGNOSIS — I480 Paroxysmal atrial fibrillation: Secondary | ICD-10-CM | POA: Diagnosis not present

## 2023-10-31 DIAGNOSIS — I428 Other cardiomyopathies: Secondary | ICD-10-CM | POA: Diagnosis not present

## 2023-10-31 DIAGNOSIS — L958 Other vasculitis limited to the skin: Secondary | ICD-10-CM | POA: Diagnosis not present

## 2023-10-31 DIAGNOSIS — L97822 Non-pressure chronic ulcer of other part of left lower leg with fat layer exposed: Secondary | ICD-10-CM | POA: Diagnosis not present

## 2023-10-31 DIAGNOSIS — I87333 Chronic venous hypertension (idiopathic) with ulcer and inflammation of bilateral lower extremity: Secondary | ICD-10-CM | POA: Diagnosis not present

## 2023-10-31 DIAGNOSIS — I890 Lymphedema, not elsewhere classified: Secondary | ICD-10-CM | POA: Diagnosis not present

## 2023-11-04 ENCOUNTER — Ambulatory Visit: Payer: Medicare PPO

## 2023-11-05 DIAGNOSIS — L570 Actinic keratosis: Secondary | ICD-10-CM | POA: Diagnosis not present

## 2023-11-05 DIAGNOSIS — L820 Inflamed seborrheic keratosis: Secondary | ICD-10-CM | POA: Diagnosis not present

## 2023-11-05 DIAGNOSIS — L97821 Non-pressure chronic ulcer of other part of left lower leg limited to breakdown of skin: Secondary | ICD-10-CM | POA: Diagnosis not present

## 2023-11-05 DIAGNOSIS — D485 Neoplasm of uncertain behavior of skin: Secondary | ICD-10-CM | POA: Diagnosis not present

## 2023-11-07 ENCOUNTER — Encounter: Payer: Medicare PPO | Admitting: Physician Assistant

## 2023-11-07 DIAGNOSIS — I87333 Chronic venous hypertension (idiopathic) with ulcer and inflammation of bilateral lower extremity: Secondary | ICD-10-CM | POA: Diagnosis not present

## 2023-11-07 DIAGNOSIS — L97822 Non-pressure chronic ulcer of other part of left lower leg with fat layer exposed: Secondary | ICD-10-CM | POA: Diagnosis not present

## 2023-11-07 DIAGNOSIS — I10 Essential (primary) hypertension: Secondary | ICD-10-CM | POA: Diagnosis not present

## 2023-11-07 DIAGNOSIS — I480 Paroxysmal atrial fibrillation: Secondary | ICD-10-CM | POA: Diagnosis not present

## 2023-11-07 DIAGNOSIS — I890 Lymphedema, not elsewhere classified: Secondary | ICD-10-CM | POA: Diagnosis not present

## 2023-11-07 DIAGNOSIS — I428 Other cardiomyopathies: Secondary | ICD-10-CM | POA: Diagnosis not present

## 2023-11-14 ENCOUNTER — Encounter: Payer: Medicare PPO | Attending: Physician Assistant | Admitting: Physician Assistant

## 2023-11-14 DIAGNOSIS — I7389 Other specified peripheral vascular diseases: Secondary | ICD-10-CM | POA: Insufficient documentation

## 2023-11-14 DIAGNOSIS — I890 Lymphedema, not elsewhere classified: Secondary | ICD-10-CM | POA: Insufficient documentation

## 2023-11-14 DIAGNOSIS — L97822 Non-pressure chronic ulcer of other part of left lower leg with fat layer exposed: Secondary | ICD-10-CM | POA: Insufficient documentation

## 2023-11-14 DIAGNOSIS — I10 Essential (primary) hypertension: Secondary | ICD-10-CM | POA: Insufficient documentation

## 2023-11-14 DIAGNOSIS — I87333 Chronic venous hypertension (idiopathic) with ulcer and inflammation of bilateral lower extremity: Secondary | ICD-10-CM | POA: Insufficient documentation

## 2023-11-14 DIAGNOSIS — I480 Paroxysmal atrial fibrillation: Secondary | ICD-10-CM | POA: Diagnosis not present

## 2023-11-28 ENCOUNTER — Encounter: Payer: Medicare PPO | Admitting: Physician Assistant

## 2023-11-28 DIAGNOSIS — I87332 Chronic venous hypertension (idiopathic) with ulcer and inflammation of left lower extremity: Secondary | ICD-10-CM | POA: Diagnosis not present

## 2023-11-28 DIAGNOSIS — I872 Venous insufficiency (chronic) (peripheral): Secondary | ICD-10-CM | POA: Diagnosis not present

## 2023-11-28 DIAGNOSIS — I10 Essential (primary) hypertension: Secondary | ICD-10-CM | POA: Diagnosis not present

## 2023-11-28 DIAGNOSIS — L958 Other vasculitis limited to the skin: Secondary | ICD-10-CM | POA: Diagnosis not present

## 2023-11-28 DIAGNOSIS — L97822 Non-pressure chronic ulcer of other part of left lower leg with fat layer exposed: Secondary | ICD-10-CM | POA: Diagnosis not present

## 2023-11-28 DIAGNOSIS — I87333 Chronic venous hypertension (idiopathic) with ulcer and inflammation of bilateral lower extremity: Secondary | ICD-10-CM | POA: Diagnosis not present

## 2023-11-28 DIAGNOSIS — I890 Lymphedema, not elsewhere classified: Secondary | ICD-10-CM | POA: Diagnosis not present

## 2023-11-28 DIAGNOSIS — I480 Paroxysmal atrial fibrillation: Secondary | ICD-10-CM | POA: Diagnosis not present

## 2023-11-28 DIAGNOSIS — I7389 Other specified peripheral vascular diseases: Secondary | ICD-10-CM | POA: Diagnosis not present

## 2023-11-30 ENCOUNTER — Other Ambulatory Visit: Payer: Self-pay | Admitting: Cardiology

## 2023-11-30 ENCOUNTER — Other Ambulatory Visit: Payer: Self-pay | Admitting: Family Medicine

## 2023-11-30 DIAGNOSIS — I872 Venous insufficiency (chronic) (peripheral): Secondary | ICD-10-CM

## 2023-11-30 DIAGNOSIS — E039 Hypothyroidism, unspecified: Secondary | ICD-10-CM

## 2023-12-02 NOTE — Telephone Encounter (Signed)
 Requested medication (s) are due for refill today - yes  Requested medication (s) are on the active medication list -yes  Future visit scheduled -no  Last refill: 03/07/23, 09/09/23  Notes to clinic: Attempted to call patient - he answered and said -"don't call me anymore" unable to speak with patient. Fails visit/lab protocols.   Requested Prescriptions  Pending Prescriptions Disp Refills   chlorthalidone (HYGROTON) 25 MG tablet [Pharmacy Med Name: CHLORTHALIDONE 25MG  TABLET] 90 tablet 2    Sig: TAKE ONE TABLET BY MOUTH ONCE A DAY     Cardiovascular: Diuretics - Thiazide Failed - 12/02/2023  4:28 PM      Failed - Cr in normal range and within 180 days    Creatinine, Ser  Date Value Ref Range Status  02/15/2023 0.99 0.76 - 1.27 mg/dL Final         Failed - K in normal range and within 180 days    Potassium  Date Value Ref Range Status  02/15/2023 4.4 3.5 - 5.2 mmol/L Final         Failed - Na in normal range and within 180 days    Sodium  Date Value Ref Range Status  02/15/2023 144 134 - 144 mmol/L Final         Failed - Last BP in normal range    BP Readings from Last 1 Encounters:  09/27/23 (!) 146/70         Failed - Valid encounter within last 6 months    Recent Outpatient Visits           1 year ago Essential hypertension   [place] [facility] Malva Limes, MD   1 year ago Persistent dry cough   [facility] [place], [place], PA-C   2 years ago Annual physical exam   [facility] Malva [facility]imes, MD   3 years ago Acquired thrombophilia [facility])   [facility] Malva Limes, MD   4 years ago Annual physical exam   [facility] Sherrie Mustache, Demetrios Isaacs, MD               XARELTO 20 MG TABS tablet [Pharmacy Med Name: Carlena Hurl 20MG  TABLET] 90 tablet 2    Sig: TAKE ONE TABLET BY MOUTH DAILY WITH SUPPER. PLEASE MAKE APPOINTMENT FOR MORE REFILLS  PLEASE     Hematology: Anticoagulants - rivaroxaban Failed - 12/02/2023  4:28 PM      Failed - HCT in normal range and within 360 days    Hematocrit  Date Value Ref Range Status  09/24/2022 37.8 37.5 - 51.0 % Final         Failed - HGB in normal range and within 360 days    Hemoglobin  Date Value Ref Range Status  09/24/2022 12.6 (L) 13.0 - 17.7 g/dL Final         Failed - PLT in normal range and within 360 days    Platelets  Date Value Ref Range Status  09/24/2022 313 150 - 450 x10E3/uL Final         Failed - Valid encounter within last 12 months    Recent Outpatient Visits           1 year ago Essential hypertension   [place] [facility] Malva Limes, MD   1 year ago Persistent dry cough   [facility] [place], [place], [place]  2 years ago Annual physical exam   [facility] Mal[facility] L[facility], MD   3 years ago Acquired thrombophilia [facility])   [place] [facility] Malva Limes, MD   4 years ago Annual physical exam   [facility] Malva Limes, MD              Passed - ALT in normal range and within 360 days    ALT  Date Value Ref Range Status  12/13/2022 25 0 - 44 IU/L Final         Passed - AST in normal range and within 360 days    AST  Date Value Ref Range Status  12/13/2022 23 0 - 40 IU/L Final         Passed - Cr in normal range and within 360 days    Creatinine, Ser  Date Value Ref Range Status  02/15/2023 0.99 0.76 - 1.27 mg/dL Final         Passed - eGFR is 15 or above and within 360 days    GFR calc Af Amer  Date Value Ref Range Status  11/18/2020 81 >59 mL/min/1.73 Final    Comment:    **In accordance with recommendations from the NKF-ASN Task force,**   [facility] is in the process of updating its eGFR calculation to the   2021 CKD-EPI creatinine equation that estimates kidney function   without a race variable.    GFR,  Estimated  Date Value Ref Range Status  08/09/2022 >60 >60 mL/min Final    Comment:    (NOTE) Calculated using the CKD-EPI Creatinine Equation (2021)    eGFR  Date Value Ref Range Status  02/15/2023 80 >59 mL/min/1.73 Final         Passed - Patient is not pregnant       levothyroxine (SYNTHROID) 50 MCG tablet [Pharmacy Med Name: LEVOTHYROXINE SODIUM TABLET] 90 tablet 2    Sig: TAKE ONE TABLET BY MOUTH DAILY     Endocrinology:  Hypothyroid Agents Failed - 12/02/2023  4:28 PM      Failed - TSH in normal range and within 360 days    TSH  Date Value Ref Range Status  09/24/2022 6.520 (H) 0.450 - 4.500 uIU/mL Final         Failed - Valid encounter within last 12 months    Recent Outpatient Visits           1 year ago Essential hypertension   [facility] Mal[facility] Limes, MD   1 year ago Persistent dry cough   [facility] [place], [place], PA-C   2 years ago Annual physical exam   [facility] Malva Limes, MD   3 years ago Acquired thrombophilia [facility])    [facility], MD   4 years ago Annual physical exam   [facility] Malva Limes, MD                 Requested Prescriptions  Pending Prescriptions Disp Refills   chlorthalidone (HYGROTON) 25 MG tablet [Pharmacy Med Name: CHLORTHALIDONE 25MG  TABLET] 90 tablet 2    Sig: TAKE ONE TABLET BY MOUTH ONCE A DAY     Cardiovascular: Diuretics - Thiazide Failed - 12/02/2023  4:28 PM      Failed - Cr in normal range and within 180 days  Creatinine, Ser  Date Value Ref Range Status  02/15/2023 0.99 0.76 - 1.27 mg/dL Final         Failed - K in normal range and within 180 days    Potassium  Date Value Ref Range Status  02/15/2023 4.4 3.5 - 5.2 mmol/L Final         Failed - Na in normal range and within 180 days    Sodium  Date Value Ref Range Status   02/15/2023 144 134 - 144 mmol/L Final         Failed - Last BP in normal range    BP Readings from Last 1 Encounters:  09/27/23 (!) 146/70         Failed - Valid encounter within last 6 months    Recent Outpatient Visits           1 year ago Essential hypertension   [place] [facility] Malva Limes, MD   1 year ago Persistent dry cough   [place] [employer], [place], PA-C   2 years ago Annual physical exam   [facility] Malva Limes, MD   3 years ago Acquired thrombophilia [facility])   [place] [facility] Malva Limes, MD   4 years ago Annual physical exam   [facility] Sherrie Mustache, Demetrios Isaacs, MD               XARELTO 20 MG TABS tablet [Pharmacy Med Name: Carlena Hurl 20MG  TABLET] 90 tablet 2    Sig: TAKE ONE TABLET BY MOUTH DAILY WITH SUPPER. PLEASE MAKE APPOINTMENT FOR MORE REFILLS PLEASE     Hematology: Anticoagulants - rivaroxaban Failed - 12/02/2023  4:28 PM      Failed - HCT in normal range and within 360 days    Hematocrit  Date Value Ref Range Status  09/24/2022 37.8 37.5 - 51.0 % Final         Failed - HGB in normal range and within 360 days    Hemoglobin  Date Value Ref Range Status  09/24/2022 12.6 (L) 13.0 - 17.7 g/dL Final         Failed - PLT in normal range and within 360 days    Platelets  Date Value Ref Range Status  09/24/2022 313 150 - 450 x10E3/uL Final         Failed - Valid encounter within last 12 months    Recent Outpatient Visits           1 year ago Essential hypertension   [facility] Malva Limes, MD   1 year ago Persistent dry cough   [facility] [place], [place], [place]   2 years ago Annual physical exam   [facility] Malva Limes, MD   3 years ago Acquired thrombophilia [facility])   [facility] Mal[facility] Limes, MD   4 years ago Annual physical exam   [facility] Malva Limes, MD              Passed - ALT in normal range and within 360 days    ALT  Date Value Ref Range Status  12/13/2022 25 0 - 44 IU/L Final         Passed - AST in normal range and within 360 days    AST  Date Value Ref Range Status  12/13/2022 23 0 - 40 IU/L Final         Passed - Cr in normal range and within 360 days    Creatinine, Ser  Date Value Ref Range Status  02/15/2023 0.99 0.76 - 1.27 mg/dL Final         Passed - eGFR is 15 or above and within 360 days    GFR calc Af Amer  Date Value Ref Range Status  11/18/2020 81 >59 mL/min/1.73 Final    Comment:    **In accordance with recommendations from the NKF-ASN Task force,**   [facility] is in the process of updating its eGFR calculation to the   2021 CKD-EPI creatinine equation that estimates kidney function   without a race variable.    GFR, Estimated  Date Value Ref Range Status  08/09/2022 >60 >60 mL/min Final    Comment:    (NOTE) Calculated using the CKD-EPI Creatinine Equation (2021)    eGFR  Date Value Ref Range Status  02/15/2023 80 >59 mL/min/1.73 Final         Passed - Patient is not pregnant       levothyroxine (SYNTHROID) 50 MCG tablet [Pharmacy Med Name: LEVOTHYROXINE SODIUM TABLET] 90 tablet 2    Sig: TAKE ONE TABLET BY MOUTH DAILY     Endocrinology:  Hypothyroid Agents Failed - 12/02/2023  4:28 PM      Failed - TSH in normal range and within 360 days    TSH  Date Value Ref Range Status  09/24/2022 6.520 (H) 0.450 - 4.500 uIU/mL Final         Failed - Valid encounter within last 12 months    Recent Outpatient Visits           1 year ago Essential hypertension   [facility] Malva Limes, MD   1 year ago Persistent dry cough   [facility] [place], [place], [place]   2 years ago Annual physical exam   [facility], [facility]   3 years ago Acquired thrombophilia [facility])   [facility] Malva Limes, MD   4 years ago Annual physical exam   [facility] Malva Limes, MD

## 2023-12-03 DIAGNOSIS — I480 Paroxysmal atrial fibrillation: Secondary | ICD-10-CM | POA: Diagnosis not present

## 2023-12-03 DIAGNOSIS — I87333 Chronic venous hypertension (idiopathic) with ulcer and inflammation of bilateral lower extremity: Secondary | ICD-10-CM | POA: Diagnosis not present

## 2023-12-03 DIAGNOSIS — I10 Essential (primary) hypertension: Secondary | ICD-10-CM | POA: Diagnosis not present

## 2023-12-03 DIAGNOSIS — I890 Lymphedema, not elsewhere classified: Secondary | ICD-10-CM | POA: Diagnosis not present

## 2023-12-03 DIAGNOSIS — L97822 Non-pressure chronic ulcer of other part of left lower leg with fat layer exposed: Secondary | ICD-10-CM | POA: Diagnosis not present

## 2023-12-03 DIAGNOSIS — I7389 Other specified peripheral vascular diseases: Secondary | ICD-10-CM | POA: Diagnosis not present

## 2023-12-10 ENCOUNTER — Telehealth: Payer: Self-pay | Admitting: Family Medicine

## 2023-12-10 ENCOUNTER — Ambulatory Visit: Payer: Medicare PPO | Admitting: Physician Assistant

## 2023-12-10 ENCOUNTER — Ambulatory Visit: Admitting: Family Medicine

## 2023-12-10 ENCOUNTER — Encounter: Payer: Self-pay | Admitting: Family Medicine

## 2023-12-10 VITALS — BP 150/64 | HR 53 | Ht 75.0 in | Wt 241.0 lb

## 2023-12-10 DIAGNOSIS — H60502 Unspecified acute noninfective otitis externa, left ear: Secondary | ICD-10-CM

## 2023-12-10 DIAGNOSIS — I4821 Permanent atrial fibrillation: Secondary | ICD-10-CM

## 2023-12-10 MED ORDER — RIVAROXABAN 20 MG PO TABS: 20.0000 mg | ORAL_TABLET | Freq: Every day | ORAL | 1 refills | Status: DC

## 2023-12-10 MED ORDER — NEOMYCIN-POLYMYXIN-HC 1 % OT SOLN: 3.0000 [drp] | Freq: Four times a day (QID) | OTIC | 0 refills | Status: AC

## 2023-12-10 NOTE — Progress Notes (Signed)
 Acute visit   Patient: [name]   DOB: [birth year]   78 y.o. Male  MRN: [id number] PCP: Malva Limes, MD   Chief Complaint  Patient presents with   Medication Refill    Pt is needing refills on his xarelto called in today as he is completely out of his medication. Reports taking as prescribed with no symptoms or side effects. Last taken last night   Care Management    AWV - last completed 11/22/21 Colonoscopy -  Tetanus Vaccine -Aware to visit pharmacy    Subjective    Discussed the use of AI scribe software for clinical note transcription with the patient, who gave verbal consent to proceed.  History of Present Illness   The patient presents for a medication refill and reports a new onset left earache. He reports that he took his last dose of Xarelto, which he takes for atrial fibrillation, the previous night. He denies any problems with the medication or his atrial fibrillation. He is followed by a cardiologist for his atrial fibrillation.  In addition to the medication refill, the patient reports a left earache that has been present for one day. He describes the pain as an ache and reports soreness around the ear and jaw. He denies any other symptoms such as congestion, fever, or discharge from the ear.        Review of Systems  Objective    BP (!) 150/64 (BP Location: Left Arm, Patient Position: Sitting, Cuff Size: Large)   Pulse (!) 53   Ht 6\' 3"  (1.905 m)   Wt 241 lb (109.3 kg)   SpO2 100%   BMI 30.12 kg/m  Physical Exam Cardiovascular:     Rate and Rhythm: Normal rate.     Comments: Irreg irreg rhythm Pulmonary:     Effort: Pulmonary effort is normal. No respiratory distress.     Breath sounds: Normal breath sounds.       Physical Exam   HEENT: Right ear normal with some cerumen present. Left ear canal inflamed with cerumen obstructing view of tympanic membrane.       No results found for any visits on 12/10/23.  Assessment & Plan      Problem List Items Addressed This Visit       Cardiovascular and Mediastinum   Permanent atrial fibrillation (HCC) CHA2DS2-VASc Score 3; On Xarelto - Primary (Chronic)   Relevant Medications   rivaroxaban (XARELTO) 20 MG TABS tablet   Other Visit Diagnoses       Acute otitis externa of left ear, unspecified type              Otitis Externa Acute left ear pain for one day with soreness extending to the jaw. Physical exam reveals inflamed and swollen ear canal with wax obstructing view of the eardrum. No recent water exposure or discharge noted. Discussed treatment with Cortisporin ear drops due to insurance preference. Explained that Cortisporin contains anti-inflammatory and antibiotic properties. - Prescribe Cortisporin ear drops, 3 drops four times a day for 5 days - Recommend Debrox for earwax removal after completing Cortisporin treatment  Atrial Fibrillation (AFib) Chronic condition managed with Xarelto. No reported issues with current management. Last dose taken last night, refill needed. Follows with Dr. Anthonette Legato in [place] for AFib management. No recent complications reported. - Refill Xarelto prescription - Send prescription to [facility] pharmacy  Follow-up - Schedule follow-up appointment with Dr. Sherrie Mustache in two months.  Meds ordered this encounter  Medications   rivaroxaban (XARELTO) 20 MG TABS tablet    Sig: Take 1 tablet (20 mg total) by mouth daily with supper.    Dispense:  90 tablet    Refill:  1   NEOMYCIN-POLYMYXIN-HYDROCORTISONE (CORTISPORIN) 1 % SOLN OTIC solution    Sig: Place 3 drops into the left ear 4 (four) times daily for 5 days.    Dispense:  10 mL    Refill:  0     Return in about 2 months (around 02/09/2024) for chronic disease f/u, With PCP.      Shirlee Latch, MD  [facility] [phone] (phone) [phone] (fax)  [facility]

## 2023-12-10 NOTE — Telephone Encounter (Signed)
 Copied from CRM [id number]. Topic: Clinical - Prescription Issue >> Dec 10, 2023  4:20 PM Dennison Nancy wrote: Reason for CRM: Thayer Ohm with  [facility] [facility] recieved an prescription cortisporin  for an ear drop issue  have the dispension in stock dont have the  solution  for the [facility] pharmacy y [phone]

## 2023-12-11 ENCOUNTER — Telehealth: Payer: Self-pay

## 2023-12-11 ENCOUNTER — Encounter: Attending: Physician Assistant

## 2023-12-11 DIAGNOSIS — I480 Paroxysmal atrial fibrillation: Secondary | ICD-10-CM | POA: Insufficient documentation

## 2023-12-11 DIAGNOSIS — I10 Essential (primary) hypertension: Secondary | ICD-10-CM | POA: Insufficient documentation

## 2023-12-11 DIAGNOSIS — I890 Lymphedema, not elsewhere classified: Secondary | ICD-10-CM | POA: Insufficient documentation

## 2023-12-11 DIAGNOSIS — I87333 Chronic venous hypertension (idiopathic) with ulcer and inflammation of bilateral lower extremity: Secondary | ICD-10-CM | POA: Diagnosis not present

## 2023-12-11 DIAGNOSIS — L97822 Non-pressure chronic ulcer of other part of left lower leg with fat layer exposed: Secondary | ICD-10-CM | POA: Diagnosis not present

## 2023-12-11 DIAGNOSIS — I739 Peripheral vascular disease, unspecified: Secondary | ICD-10-CM | POA: Insufficient documentation

## 2023-12-11 NOTE — Telephone Encounter (Signed)
 Copied from CRM [id number]. Topic: Clinical - Prescription Issue >> Dec 10, 2023  4:20 PM Dennison Nancy wrote: Reason for CRM: Thayer Ohm with  [facility] [facility] recieved an prescription cortisporin  for an ear drop issue  have the dispension in stock dont have the  solution  for the cortisporin [place] pharmacy y [phone] >> Dec 10, 2023  5:48 PM Victorino Dike T wrote: Pharmacy calling again about the drops. They will order the solution and have it ready tomorrow.

## 2023-12-12 NOTE — Telephone Encounter (Signed)
 2nd message looks like they are going to have the drops ready today, right?  If not, can they let us know what they do have in stock?

## 2023-12-12 NOTE — Telephone Encounter (Signed)
 Duplicate phone message

## 2023-12-13 NOTE — Telephone Encounter (Signed)
Pt has picked up rx 

## 2023-12-18 ENCOUNTER — Other Ambulatory Visit: Payer: Self-pay | Admitting: Family Medicine

## 2023-12-18 DIAGNOSIS — E039 Hypothyroidism, unspecified: Secondary | ICD-10-CM

## 2023-12-20 ENCOUNTER — Encounter: Attending: Physician Assistant | Admitting: Physician Assistant

## 2023-12-20 DIAGNOSIS — L97822 Non-pressure chronic ulcer of other part of left lower leg with fat layer exposed: Secondary | ICD-10-CM | POA: Diagnosis not present

## 2023-12-20 DIAGNOSIS — I890 Lymphedema, not elsewhere classified: Secondary | ICD-10-CM | POA: Insufficient documentation

## 2023-12-20 DIAGNOSIS — I7389 Other specified peripheral vascular diseases: Secondary | ICD-10-CM | POA: Insufficient documentation

## 2023-12-20 DIAGNOSIS — I87333 Chronic venous hypertension (idiopathic) with ulcer and inflammation of bilateral lower extremity: Secondary | ICD-10-CM | POA: Insufficient documentation

## 2023-12-20 DIAGNOSIS — I480 Paroxysmal atrial fibrillation: Secondary | ICD-10-CM | POA: Diagnosis not present

## 2023-12-20 DIAGNOSIS — L958 Other vasculitis limited to the skin: Secondary | ICD-10-CM | POA: Diagnosis not present

## 2023-12-20 DIAGNOSIS — I10 Essential (primary) hypertension: Secondary | ICD-10-CM | POA: Insufficient documentation

## 2023-12-27 ENCOUNTER — Ambulatory Visit: Admitting: Physician Assistant

## 2023-12-27 ENCOUNTER — Encounter: Admitting: Physician Assistant

## 2023-12-27 DIAGNOSIS — I890 Lymphedema, not elsewhere classified: Secondary | ICD-10-CM | POA: Diagnosis not present

## 2023-12-27 DIAGNOSIS — I10 Essential (primary) hypertension: Secondary | ICD-10-CM | POA: Diagnosis not present

## 2023-12-27 DIAGNOSIS — I87333 Chronic venous hypertension (idiopathic) with ulcer and inflammation of bilateral lower extremity: Secondary | ICD-10-CM | POA: Diagnosis not present

## 2023-12-27 DIAGNOSIS — L958 Other vasculitis limited to the skin: Secondary | ICD-10-CM | POA: Diagnosis not present

## 2023-12-27 DIAGNOSIS — L97822 Non-pressure chronic ulcer of other part of left lower leg with fat layer exposed: Secondary | ICD-10-CM | POA: Diagnosis not present

## 2023-12-27 DIAGNOSIS — I480 Paroxysmal atrial fibrillation: Secondary | ICD-10-CM | POA: Diagnosis not present

## 2023-12-27 DIAGNOSIS — I739 Peripheral vascular disease, unspecified: Secondary | ICD-10-CM | POA: Diagnosis not present

## 2023-12-30 DIAGNOSIS — I890 Lymphedema, not elsewhere classified: Secondary | ICD-10-CM | POA: Diagnosis not present

## 2024-01-02 ENCOUNTER — Encounter: Admitting: Internal Medicine

## 2024-01-02 DIAGNOSIS — L97822 Non-pressure chronic ulcer of other part of left lower leg with fat layer exposed: Secondary | ICD-10-CM | POA: Diagnosis not present

## 2024-01-02 DIAGNOSIS — I10 Essential (primary) hypertension: Secondary | ICD-10-CM | POA: Diagnosis not present

## 2024-01-02 DIAGNOSIS — I87333 Chronic venous hypertension (idiopathic) with ulcer and inflammation of bilateral lower extremity: Secondary | ICD-10-CM | POA: Diagnosis not present

## 2024-01-02 DIAGNOSIS — L958 Other vasculitis limited to the skin: Secondary | ICD-10-CM | POA: Diagnosis not present

## 2024-01-02 DIAGNOSIS — I480 Paroxysmal atrial fibrillation: Secondary | ICD-10-CM | POA: Diagnosis not present

## 2024-01-02 DIAGNOSIS — I890 Lymphedema, not elsewhere classified: Secondary | ICD-10-CM | POA: Diagnosis not present

## 2024-01-02 DIAGNOSIS — I739 Peripheral vascular disease, unspecified: Secondary | ICD-10-CM | POA: Diagnosis not present

## 2024-01-09 ENCOUNTER — Encounter: Attending: Physician Assistant | Admitting: Physician Assistant

## 2024-01-09 DIAGNOSIS — I890 Lymphedema, not elsewhere classified: Secondary | ICD-10-CM | POA: Diagnosis not present

## 2024-01-09 DIAGNOSIS — I480 Paroxysmal atrial fibrillation: Secondary | ICD-10-CM | POA: Insufficient documentation

## 2024-01-09 DIAGNOSIS — I87333 Chronic venous hypertension (idiopathic) with ulcer and inflammation of bilateral lower extremity: Secondary | ICD-10-CM | POA: Insufficient documentation

## 2024-01-09 DIAGNOSIS — L959 Vasculitis limited to the skin, unspecified: Secondary | ICD-10-CM | POA: Diagnosis not present

## 2024-01-09 DIAGNOSIS — L97822 Non-pressure chronic ulcer of other part of left lower leg with fat layer exposed: Secondary | ICD-10-CM | POA: Insufficient documentation

## 2024-01-09 DIAGNOSIS — I7389 Other specified peripheral vascular diseases: Secondary | ICD-10-CM | POA: Insufficient documentation

## 2024-01-09 DIAGNOSIS — I10 Essential (primary) hypertension: Secondary | ICD-10-CM | POA: Insufficient documentation

## 2024-01-16 ENCOUNTER — Encounter: Admitting: Physician Assistant

## 2024-01-16 ENCOUNTER — Encounter: Payer: Self-pay | Admitting: Cardiology

## 2024-01-16 DIAGNOSIS — L959 Vasculitis limited to the skin, unspecified: Secondary | ICD-10-CM | POA: Diagnosis not present

## 2024-01-16 DIAGNOSIS — I7389 Other specified peripheral vascular diseases: Secondary | ICD-10-CM | POA: Diagnosis not present

## 2024-01-16 DIAGNOSIS — I10 Essential (primary) hypertension: Secondary | ICD-10-CM | POA: Diagnosis not present

## 2024-01-16 DIAGNOSIS — I87333 Chronic venous hypertension (idiopathic) with ulcer and inflammation of bilateral lower extremity: Secondary | ICD-10-CM | POA: Diagnosis not present

## 2024-01-16 DIAGNOSIS — L97822 Non-pressure chronic ulcer of other part of left lower leg with fat layer exposed: Secondary | ICD-10-CM | POA: Diagnosis not present

## 2024-01-16 DIAGNOSIS — I890 Lymphedema, not elsewhere classified: Secondary | ICD-10-CM | POA: Diagnosis not present

## 2024-01-16 DIAGNOSIS — I480 Paroxysmal atrial fibrillation: Secondary | ICD-10-CM | POA: Diagnosis not present

## 2024-01-21 DIAGNOSIS — C44329 Squamous cell carcinoma of skin of other parts of face: Secondary | ICD-10-CM | POA: Diagnosis not present

## 2024-01-21 DIAGNOSIS — D225 Melanocytic nevi of trunk: Secondary | ICD-10-CM | POA: Diagnosis not present

## 2024-01-21 DIAGNOSIS — C44722 Squamous cell carcinoma of skin of right lower limb, including hip: Secondary | ICD-10-CM | POA: Diagnosis not present

## 2024-01-21 DIAGNOSIS — D485 Neoplasm of uncertain behavior of skin: Secondary | ICD-10-CM | POA: Diagnosis not present

## 2024-01-21 DIAGNOSIS — L281 Prurigo nodularis: Secondary | ICD-10-CM | POA: Diagnosis not present

## 2024-01-21 DIAGNOSIS — Z85828 Personal history of other malignant neoplasm of skin: Secondary | ICD-10-CM | POA: Diagnosis not present

## 2024-01-21 DIAGNOSIS — L570 Actinic keratosis: Secondary | ICD-10-CM | POA: Diagnosis not present

## 2024-01-21 DIAGNOSIS — D2272 Melanocytic nevi of left lower limb, including hip: Secondary | ICD-10-CM | POA: Diagnosis not present

## 2024-01-21 DIAGNOSIS — D2262 Melanocytic nevi of left upper limb, including shoulder: Secondary | ICD-10-CM | POA: Diagnosis not present

## 2024-01-21 DIAGNOSIS — D2261 Melanocytic nevi of right upper limb, including shoulder: Secondary | ICD-10-CM | POA: Diagnosis not present

## 2024-01-23 ENCOUNTER — Encounter: Admitting: Physician Assistant

## 2024-01-23 DIAGNOSIS — I87333 Chronic venous hypertension (idiopathic) with ulcer and inflammation of bilateral lower extremity: Secondary | ICD-10-CM | POA: Diagnosis not present

## 2024-01-23 DIAGNOSIS — L959 Vasculitis limited to the skin, unspecified: Secondary | ICD-10-CM | POA: Diagnosis not present

## 2024-01-23 DIAGNOSIS — I890 Lymphedema, not elsewhere classified: Secondary | ICD-10-CM | POA: Diagnosis not present

## 2024-01-23 DIAGNOSIS — I480 Paroxysmal atrial fibrillation: Secondary | ICD-10-CM | POA: Diagnosis not present

## 2024-01-23 DIAGNOSIS — L97822 Non-pressure chronic ulcer of other part of left lower leg with fat layer exposed: Secondary | ICD-10-CM | POA: Diagnosis not present

## 2024-01-23 DIAGNOSIS — I10 Essential (primary) hypertension: Secondary | ICD-10-CM | POA: Diagnosis not present

## 2024-01-23 DIAGNOSIS — I7389 Other specified peripheral vascular diseases: Secondary | ICD-10-CM | POA: Diagnosis not present

## 2024-01-30 ENCOUNTER — Encounter

## 2024-01-30 DIAGNOSIS — I480 Paroxysmal atrial fibrillation: Secondary | ICD-10-CM | POA: Diagnosis not present

## 2024-01-30 DIAGNOSIS — I7389 Other specified peripheral vascular diseases: Secondary | ICD-10-CM | POA: Diagnosis not present

## 2024-01-30 DIAGNOSIS — I87333 Chronic venous hypertension (idiopathic) with ulcer and inflammation of bilateral lower extremity: Secondary | ICD-10-CM | POA: Diagnosis not present

## 2024-01-30 DIAGNOSIS — I10 Essential (primary) hypertension: Secondary | ICD-10-CM | POA: Diagnosis not present

## 2024-01-30 DIAGNOSIS — L97822 Non-pressure chronic ulcer of other part of left lower leg with fat layer exposed: Secondary | ICD-10-CM | POA: Diagnosis not present

## 2024-01-30 DIAGNOSIS — I890 Lymphedema, not elsewhere classified: Secondary | ICD-10-CM | POA: Diagnosis not present

## 2024-02-06 ENCOUNTER — Encounter: Attending: Physician Assistant | Admitting: Physician Assistant

## 2024-02-06 DIAGNOSIS — I7389 Other specified peripheral vascular diseases: Secondary | ICD-10-CM | POA: Diagnosis not present

## 2024-02-06 DIAGNOSIS — I890 Lymphedema, not elsewhere classified: Secondary | ICD-10-CM | POA: Insufficient documentation

## 2024-02-06 DIAGNOSIS — L97822 Non-pressure chronic ulcer of other part of left lower leg with fat layer exposed: Secondary | ICD-10-CM | POA: Insufficient documentation

## 2024-02-06 DIAGNOSIS — I87333 Chronic venous hypertension (idiopathic) with ulcer and inflammation of bilateral lower extremity: Secondary | ICD-10-CM | POA: Insufficient documentation

## 2024-02-06 DIAGNOSIS — L959 Vasculitis limited to the skin, unspecified: Secondary | ICD-10-CM | POA: Diagnosis not present

## 2024-02-06 DIAGNOSIS — I10 Essential (primary) hypertension: Secondary | ICD-10-CM | POA: Diagnosis not present

## 2024-02-06 DIAGNOSIS — I480 Paroxysmal atrial fibrillation: Secondary | ICD-10-CM | POA: Insufficient documentation

## 2024-02-11 ENCOUNTER — Ambulatory Visit: Admitting: Family Medicine

## 2024-02-13 ENCOUNTER — Encounter: Admitting: Physician Assistant

## 2024-02-13 DIAGNOSIS — I480 Paroxysmal atrial fibrillation: Secondary | ICD-10-CM | POA: Diagnosis not present

## 2024-02-13 DIAGNOSIS — L959 Vasculitis limited to the skin, unspecified: Secondary | ICD-10-CM | POA: Diagnosis not present

## 2024-02-13 DIAGNOSIS — I87333 Chronic venous hypertension (idiopathic) with ulcer and inflammation of bilateral lower extremity: Secondary | ICD-10-CM | POA: Diagnosis not present

## 2024-02-13 DIAGNOSIS — I10 Essential (primary) hypertension: Secondary | ICD-10-CM | POA: Diagnosis not present

## 2024-02-13 DIAGNOSIS — L97822 Non-pressure chronic ulcer of other part of left lower leg with fat layer exposed: Secondary | ICD-10-CM | POA: Diagnosis not present

## 2024-02-13 DIAGNOSIS — I7389 Other specified peripheral vascular diseases: Secondary | ICD-10-CM | POA: Diagnosis not present

## 2024-02-13 DIAGNOSIS — I890 Lymphedema, not elsewhere classified: Secondary | ICD-10-CM | POA: Diagnosis not present

## 2024-02-19 ENCOUNTER — Encounter: Admitting: Physician Assistant

## 2024-02-19 DIAGNOSIS — I7389 Other specified peripheral vascular diseases: Secondary | ICD-10-CM | POA: Diagnosis not present

## 2024-02-19 DIAGNOSIS — I890 Lymphedema, not elsewhere classified: Secondary | ICD-10-CM | POA: Diagnosis not present

## 2024-02-19 DIAGNOSIS — I87333 Chronic venous hypertension (idiopathic) with ulcer and inflammation of bilateral lower extremity: Secondary | ICD-10-CM | POA: Diagnosis not present

## 2024-02-19 DIAGNOSIS — L959 Vasculitis limited to the skin, unspecified: Secondary | ICD-10-CM | POA: Diagnosis not present

## 2024-02-19 DIAGNOSIS — L97822 Non-pressure chronic ulcer of other part of left lower leg with fat layer exposed: Secondary | ICD-10-CM | POA: Diagnosis not present

## 2024-02-19 DIAGNOSIS — I480 Paroxysmal atrial fibrillation: Secondary | ICD-10-CM | POA: Diagnosis not present

## 2024-02-19 DIAGNOSIS — I10 Essential (primary) hypertension: Secondary | ICD-10-CM | POA: Diagnosis not present

## 2024-02-27 ENCOUNTER — Encounter: Admitting: Physician Assistant

## 2024-02-27 DIAGNOSIS — I890 Lymphedema, not elsewhere classified: Secondary | ICD-10-CM | POA: Diagnosis not present

## 2024-02-27 DIAGNOSIS — I480 Paroxysmal atrial fibrillation: Secondary | ICD-10-CM | POA: Diagnosis not present

## 2024-02-27 DIAGNOSIS — L959 Vasculitis limited to the skin, unspecified: Secondary | ICD-10-CM | POA: Diagnosis not present

## 2024-02-27 DIAGNOSIS — I10 Essential (primary) hypertension: Secondary | ICD-10-CM | POA: Diagnosis not present

## 2024-02-27 DIAGNOSIS — L97822 Non-pressure chronic ulcer of other part of left lower leg with fat layer exposed: Secondary | ICD-10-CM | POA: Diagnosis not present

## 2024-02-27 DIAGNOSIS — I7389 Other specified peripheral vascular diseases: Secondary | ICD-10-CM | POA: Diagnosis not present

## 2024-02-27 DIAGNOSIS — I87333 Chronic venous hypertension (idiopathic) with ulcer and inflammation of bilateral lower extremity: Secondary | ICD-10-CM | POA: Diagnosis not present

## 2024-03-06 ENCOUNTER — Ambulatory Visit: Admitting: Physician Assistant

## 2024-03-10 ENCOUNTER — Encounter: Attending: Physician Assistant | Admitting: Physician Assistant

## 2024-03-10 DIAGNOSIS — L959 Vasculitis limited to the skin, unspecified: Secondary | ICD-10-CM | POA: Diagnosis not present

## 2024-03-10 DIAGNOSIS — I87333 Chronic venous hypertension (idiopathic) with ulcer and inflammation of bilateral lower extremity: Secondary | ICD-10-CM | POA: Insufficient documentation

## 2024-03-10 DIAGNOSIS — I480 Paroxysmal atrial fibrillation: Secondary | ICD-10-CM | POA: Insufficient documentation

## 2024-03-10 DIAGNOSIS — I7389 Other specified peripheral vascular diseases: Secondary | ICD-10-CM | POA: Diagnosis not present

## 2024-03-10 DIAGNOSIS — I10 Essential (primary) hypertension: Secondary | ICD-10-CM | POA: Insufficient documentation

## 2024-03-10 DIAGNOSIS — L97822 Non-pressure chronic ulcer of other part of left lower leg with fat layer exposed: Secondary | ICD-10-CM | POA: Insufficient documentation

## 2024-03-10 DIAGNOSIS — L905 Scar conditions and fibrosis of skin: Secondary | ICD-10-CM | POA: Diagnosis not present

## 2024-03-10 DIAGNOSIS — C44329 Squamous cell carcinoma of skin of other parts of face: Secondary | ICD-10-CM | POA: Diagnosis not present

## 2024-03-10 DIAGNOSIS — I890 Lymphedema, not elsewhere classified: Secondary | ICD-10-CM | POA: Insufficient documentation

## 2024-03-13 ENCOUNTER — Other Ambulatory Visit: Payer: Self-pay | Admitting: Family Medicine

## 2024-03-13 DIAGNOSIS — E039 Hypothyroidism, unspecified: Secondary | ICD-10-CM

## 2024-03-17 DIAGNOSIS — C44722 Squamous cell carcinoma of skin of right lower limb, including hip: Secondary | ICD-10-CM | POA: Diagnosis not present

## 2024-03-19 ENCOUNTER — Encounter: Admitting: Physician Assistant

## 2024-03-19 DIAGNOSIS — I87333 Chronic venous hypertension (idiopathic) with ulcer and inflammation of bilateral lower extremity: Secondary | ICD-10-CM | POA: Diagnosis not present

## 2024-03-19 DIAGNOSIS — I890 Lymphedema, not elsewhere classified: Secondary | ICD-10-CM | POA: Diagnosis not present

## 2024-03-19 DIAGNOSIS — I7389 Other specified peripheral vascular diseases: Secondary | ICD-10-CM | POA: Diagnosis not present

## 2024-03-19 DIAGNOSIS — I10 Essential (primary) hypertension: Secondary | ICD-10-CM | POA: Diagnosis not present

## 2024-03-19 DIAGNOSIS — L959 Vasculitis limited to the skin, unspecified: Secondary | ICD-10-CM | POA: Diagnosis not present

## 2024-03-19 DIAGNOSIS — L97822 Non-pressure chronic ulcer of other part of left lower leg with fat layer exposed: Secondary | ICD-10-CM | POA: Diagnosis not present

## 2024-03-19 DIAGNOSIS — I480 Paroxysmal atrial fibrillation: Secondary | ICD-10-CM | POA: Diagnosis not present

## 2024-03-31 ENCOUNTER — Encounter: Admitting: Physician Assistant

## 2024-03-31 DIAGNOSIS — I87333 Chronic venous hypertension (idiopathic) with ulcer and inflammation of bilateral lower extremity: Secondary | ICD-10-CM | POA: Diagnosis not present

## 2024-03-31 DIAGNOSIS — L97822 Non-pressure chronic ulcer of other part of left lower leg with fat layer exposed: Secondary | ICD-10-CM | POA: Diagnosis not present

## 2024-03-31 DIAGNOSIS — I890 Lymphedema, not elsewhere classified: Secondary | ICD-10-CM | POA: Diagnosis not present

## 2024-03-31 DIAGNOSIS — I7389 Other specified peripheral vascular diseases: Secondary | ICD-10-CM | POA: Diagnosis not present

## 2024-03-31 DIAGNOSIS — I10 Essential (primary) hypertension: Secondary | ICD-10-CM | POA: Diagnosis not present

## 2024-03-31 DIAGNOSIS — I480 Paroxysmal atrial fibrillation: Secondary | ICD-10-CM | POA: Diagnosis not present

## 2024-04-07 ENCOUNTER — Ambulatory Visit: Admitting: Physician Assistant

## 2024-04-07 DIAGNOSIS — H43812 Vitreous degeneration, left eye: Secondary | ICD-10-CM | POA: Diagnosis not present

## 2024-04-07 DIAGNOSIS — H3530 Unspecified macular degeneration: Secondary | ICD-10-CM | POA: Diagnosis not present

## 2024-04-07 DIAGNOSIS — Z0100 Encounter for examination of eyes and vision without abnormal findings: Secondary | ICD-10-CM | POA: Diagnosis not present

## 2024-04-07 DIAGNOSIS — H2513 Age-related nuclear cataract, bilateral: Secondary | ICD-10-CM | POA: Diagnosis not present

## 2024-04-08 ENCOUNTER — Encounter: Attending: Physician Assistant | Admitting: Physician Assistant

## 2024-04-08 DIAGNOSIS — L97822 Non-pressure chronic ulcer of other part of left lower leg with fat layer exposed: Secondary | ICD-10-CM | POA: Insufficient documentation

## 2024-04-08 DIAGNOSIS — L959 Vasculitis limited to the skin, unspecified: Secondary | ICD-10-CM | POA: Diagnosis not present

## 2024-04-08 DIAGNOSIS — I10 Essential (primary) hypertension: Secondary | ICD-10-CM | POA: Insufficient documentation

## 2024-04-08 DIAGNOSIS — I890 Lymphedema, not elsewhere classified: Secondary | ICD-10-CM | POA: Diagnosis not present

## 2024-04-08 DIAGNOSIS — I480 Paroxysmal atrial fibrillation: Secondary | ICD-10-CM | POA: Insufficient documentation

## 2024-04-08 DIAGNOSIS — I739 Peripheral vascular disease, unspecified: Secondary | ICD-10-CM | POA: Diagnosis not present

## 2024-04-08 DIAGNOSIS — I87333 Chronic venous hypertension (idiopathic) with ulcer and inflammation of bilateral lower extremity: Secondary | ICD-10-CM | POA: Insufficient documentation

## 2024-04-15 ENCOUNTER — Encounter: Admitting: Physician Assistant

## 2024-04-15 DIAGNOSIS — I739 Peripheral vascular disease, unspecified: Secondary | ICD-10-CM | POA: Diagnosis not present

## 2024-04-15 DIAGNOSIS — I890 Lymphedema, not elsewhere classified: Secondary | ICD-10-CM | POA: Diagnosis not present

## 2024-04-15 DIAGNOSIS — I480 Paroxysmal atrial fibrillation: Secondary | ICD-10-CM | POA: Diagnosis not present

## 2024-04-15 DIAGNOSIS — L959 Vasculitis limited to the skin, unspecified: Secondary | ICD-10-CM | POA: Diagnosis not present

## 2024-04-15 DIAGNOSIS — I10 Essential (primary) hypertension: Secondary | ICD-10-CM | POA: Diagnosis not present

## 2024-04-15 DIAGNOSIS — I87333 Chronic venous hypertension (idiopathic) with ulcer and inflammation of bilateral lower extremity: Secondary | ICD-10-CM | POA: Diagnosis not present

## 2024-04-15 DIAGNOSIS — L97822 Non-pressure chronic ulcer of other part of left lower leg with fat layer exposed: Secondary | ICD-10-CM | POA: Diagnosis not present

## 2024-04-23 ENCOUNTER — Encounter: Admitting: Physician Assistant

## 2024-04-23 DIAGNOSIS — I10 Essential (primary) hypertension: Secondary | ICD-10-CM | POA: Diagnosis not present

## 2024-04-23 DIAGNOSIS — I480 Paroxysmal atrial fibrillation: Secondary | ICD-10-CM | POA: Diagnosis not present

## 2024-04-23 DIAGNOSIS — L959 Vasculitis limited to the skin, unspecified: Secondary | ICD-10-CM | POA: Diagnosis not present

## 2024-04-23 DIAGNOSIS — I87333 Chronic venous hypertension (idiopathic) with ulcer and inflammation of bilateral lower extremity: Secondary | ICD-10-CM | POA: Diagnosis not present

## 2024-04-23 DIAGNOSIS — I890 Lymphedema, not elsewhere classified: Secondary | ICD-10-CM | POA: Diagnosis not present

## 2024-04-23 DIAGNOSIS — I739 Peripheral vascular disease, unspecified: Secondary | ICD-10-CM | POA: Diagnosis not present

## 2024-04-23 DIAGNOSIS — L97822 Non-pressure chronic ulcer of other part of left lower leg with fat layer exposed: Secondary | ICD-10-CM | POA: Diagnosis not present

## 2024-04-28 ENCOUNTER — Encounter: Payer: Self-pay | Admitting: Family Medicine

## 2024-05-07 ENCOUNTER — Other Ambulatory Visit: Payer: Self-pay | Admitting: Family Medicine

## 2024-05-07 ENCOUNTER — Encounter: Admitting: Physician Assistant

## 2024-05-07 DIAGNOSIS — I87333 Chronic venous hypertension (idiopathic) with ulcer and inflammation of bilateral lower extremity: Secondary | ICD-10-CM | POA: Diagnosis not present

## 2024-05-07 DIAGNOSIS — I10 Essential (primary) hypertension: Secondary | ICD-10-CM | POA: Diagnosis not present

## 2024-05-07 DIAGNOSIS — I739 Peripheral vascular disease, unspecified: Secondary | ICD-10-CM | POA: Diagnosis not present

## 2024-05-07 DIAGNOSIS — L97822 Non-pressure chronic ulcer of other part of left lower leg with fat layer exposed: Secondary | ICD-10-CM | POA: Diagnosis not present

## 2024-05-07 DIAGNOSIS — I890 Lymphedema, not elsewhere classified: Secondary | ICD-10-CM | POA: Diagnosis not present

## 2024-05-07 DIAGNOSIS — I480 Paroxysmal atrial fibrillation: Secondary | ICD-10-CM | POA: Diagnosis not present

## 2024-05-07 DIAGNOSIS — L959 Vasculitis limited to the skin, unspecified: Secondary | ICD-10-CM | POA: Diagnosis not present

## 2024-05-21 ENCOUNTER — Encounter: Attending: Physician Assistant | Admitting: Physician Assistant

## 2024-05-21 DIAGNOSIS — I890 Lymphedema, not elsewhere classified: Secondary | ICD-10-CM | POA: Diagnosis not present

## 2024-05-21 DIAGNOSIS — I7389 Other specified peripheral vascular diseases: Secondary | ICD-10-CM | POA: Insufficient documentation

## 2024-05-21 DIAGNOSIS — Z7901 Long term (current) use of anticoagulants: Secondary | ICD-10-CM | POA: Insufficient documentation

## 2024-05-21 DIAGNOSIS — L959 Vasculitis limited to the skin, unspecified: Secondary | ICD-10-CM | POA: Diagnosis not present

## 2024-05-21 DIAGNOSIS — E11622 Type 2 diabetes mellitus with other skin ulcer: Secondary | ICD-10-CM | POA: Diagnosis not present

## 2024-05-21 DIAGNOSIS — I10 Essential (primary) hypertension: Secondary | ICD-10-CM | POA: Diagnosis not present

## 2024-05-21 DIAGNOSIS — L97822 Non-pressure chronic ulcer of other part of left lower leg with fat layer exposed: Secondary | ICD-10-CM | POA: Diagnosis not present

## 2024-05-21 DIAGNOSIS — I480 Paroxysmal atrial fibrillation: Secondary | ICD-10-CM | POA: Diagnosis not present

## 2024-05-21 DIAGNOSIS — I87333 Chronic venous hypertension (idiopathic) with ulcer and inflammation of bilateral lower extremity: Secondary | ICD-10-CM | POA: Insufficient documentation

## 2024-05-25 ENCOUNTER — Other Ambulatory Visit: Payer: Self-pay | Admitting: Family Medicine

## 2024-06-03 ENCOUNTER — Ambulatory Visit (INDEPENDENT_AMBULATORY_CARE_PROVIDER_SITE_OTHER): Admitting: Family Medicine

## 2024-06-03 VITALS — BP 136/80 | HR 65 | Temp 98.3°F | Ht 75.0 in | Wt 234.5 lb

## 2024-06-03 DIAGNOSIS — K760 Fatty (change of) liver, not elsewhere classified: Secondary | ICD-10-CM | POA: Diagnosis not present

## 2024-06-03 DIAGNOSIS — Z0001 Encounter for general adult medical examination with abnormal findings: Secondary | ICD-10-CM

## 2024-06-03 DIAGNOSIS — I4821 Permanent atrial fibrillation: Secondary | ICD-10-CM | POA: Diagnosis not present

## 2024-06-03 DIAGNOSIS — R7303 Prediabetes: Secondary | ICD-10-CM | POA: Diagnosis not present

## 2024-06-03 DIAGNOSIS — I10 Essential (primary) hypertension: Secondary | ICD-10-CM

## 2024-06-03 DIAGNOSIS — Z125 Encounter for screening for malignant neoplasm of prostate: Secondary | ICD-10-CM | POA: Diagnosis not present

## 2024-06-03 DIAGNOSIS — E039 Hypothyroidism, unspecified: Secondary | ICD-10-CM

## 2024-06-03 DIAGNOSIS — I739 Peripheral vascular disease, unspecified: Secondary | ICD-10-CM | POA: Diagnosis not present

## 2024-06-03 DIAGNOSIS — D6869 Other thrombophilia: Secondary | ICD-10-CM

## 2024-06-03 DIAGNOSIS — E785 Hyperlipidemia, unspecified: Secondary | ICD-10-CM | POA: Diagnosis not present

## 2024-06-03 DIAGNOSIS — Z0000 Encounter for general adult medical examination without abnormal findings: Secondary | ICD-10-CM

## 2024-06-03 NOTE — Progress Notes (Signed)
 Annual Wellness Visit     Patient: [name], Male    DOB: July 25, 1946, 78 y.o.   MRN: [id number] Visit Date: 06/03/2024  Today's Provider: Nancyann Perry, MD    Subjective    DOMNIC [name] is a 78 y.o. male who presents today for his Annual Wellness Visit.    Medications: Outpatient Medications Prior to Visit  Medication Sig   carvedilol  (COREG ) 6.25 MG tablet TAKE ONE TABLET (6.25 MG TOTAL) BY MOUTH TWO (TWO) TIMES DAILY.   chlorthalidone  (HYGROTON ) 25 MG tablet TAKE ONE TABLET BY MOUTH ONCE A DAY   furosemide  (LASIX ) 20 MG tablet TAKE ONE TABLET BY MOUTH ONCE A DAY   levothyroxine  (SYNTHROID ) 50 MCG tablet TAKE ONE TABLET BY MOUTH DAILY (PER MD PATIENT NEEDS OFFICE VISIT)   Multiple Vitamin (MULTIVITAMIN) tablet Take 1 tablet by mouth daily.   potassium chloride  SA (KLOR-CON  M) 20 MEQ tablet TAKE TWO TABLETS BY MOUTH ONCE A DAY   rosuvastatin  (CRESTOR ) 10 MG tablet TAKE ONE TABLET BY MOUTH ONCE DAILY   valsartan  (DIOVAN ) 320 MG tablet TAKE ONE TABLET (320 MG TOTAL) BY MOUTH DAILY.   XARELTO  20 MG TABS tablet TAKE ONE TABLET (20 MG TOTAL) BY MOUTH DAILY WITH SUPPER.   No facility-administered medications prior to visit.    Allergies  Allergen Reactions   Benazepril      COUGH     Patient Care Team: Perry Nancyann BRAVO, MD as PCP - General (Family Medicine) Anner Alm ORN, MD as PCP - Cardiology (Cardiology) Darron Deatrice LABOR, MD as PCP - [facility] (Cardiology) Dasher, Alm LABOR, MD (Dermatology) Helon Clotilda LABOR, PA-C as Physician Assistant (Urology) System, Provider Not In         Objective     Most recent functional status assessment:    06/03/2024   12:53 PM  In your present state of health, do you have any difficulty performing the following activities:  Hearing? 0  Vision? 0  Difficulty concentrating or making decisions? 0  Walking or climbing stairs? 0  Dressing or bathing? 0  Doing errands, shopping? 0   Most recent fall risk  assessment:    06/03/2024    9:45 AM  Fall Risk   Falls in the past year? 1  Number falls in past yr: 1  Injury with Fall? 0    Most recent depression screenings:    06/03/2024    9:45 AM 12/10/2023    3:39 PM  PHQ 2/9 Scores  PHQ - 2 Score 0 0  PHQ- 9 Score 2    Most recent cognitive screening:    06/03/2024    9:46 AM  6CIT Screen  What Year? 0 points  What month? 0 points  What time? 0 points  Count back from 20 0 points  Months in reverse 0 points  Repeat phrase 4 points  Total Score 4 points   Most recent Audit-C alcohol use screening    11/22/2021    8:45 AM  Alcohol Use Disorder Test (AUDIT)  1. How often do you have a drink containing alcohol? 4  2. How many drinks containing alcohol do you have on a typical day when you are drinking? 0  3. How often do you have six or more drinks on one occasion? 0  AUDIT-C Score 4  4. How often during the last year have you found that you were not able to stop drinking once you had started? 0  5. How often during  the last year have you failed to do what was normally expected from you because of drinking? 0  6. How often during the last year have you needed a first drink in the morning to get yourself going after a heavy drinking session? 0  7. How often during the last year have you had a feeling of guilt of remorse after drinking? 0  8. How often during the last year have you been unable to remember what happened the night before because you had been drinking? 0  9. Have you or someone else been injured as a result of your drinking? 0  10. Has a relative or friend or a doctor or another health worker been concerned about your drinking or suggested you cut down? 0  Alcohol Use Disorder Identification Test Final Score (AUDIT) 4   A score of 3 or more in women, and 4 or more in men indicates increased risk for alcohol abuse, EXCEPT if all of the points are from question 1   No results found for any visits on 06/03/24.  Assessment  & Plan     Annual wellness visit done today including the all of the following: Reviewed patient's Family Medical History Reviewed and updated list of patient's medical providers Assessment of cognitive impairment was done Assessed patient's functional ability Established a written schedule for health screening services Health Risk Assessent Completed and Reviewed  Exercise Activities and Dietary recommendations  Goals      DIET - EAT MORE FRUITS AND VEGETABLES     DIET - INCREASE WATER INTAKE     Recommend increasing water intake to 6-8 8 oz glasses a day.         Immunization History  Administered Date(s) Administered   Fluad Quad(high Dose 65+) 06/22/2019   INFLUENZA, HIGH DOSE SEASONAL PF 06/08/2020, 07/07/2021   Influenza-Unspecified 06/25/2017, 06/22/2018   PFIZER(Purple Top)SARS-COV-2 Vaccination 11/23/2019, 12/14/2019, 07/19/2020   Pneumococcal Conjugate-13 09/20/2014   Pneumococcal Polysaccharide-23 09/26/2015   Tdap 03/27/2011   Zoster Recombinant(Shingrix) 10/14/2018, 12/22/2018   Zoster, Live 03/24/2010    Health Maintenance  Topic Date Due   DTaP/Tdap/Td (2 - Td or Tdap) 03/26/2021   Medicare Annual Wellness (AWV)  11/22/2022   COVID-19 Vaccine (4 - 2024-25 season) 06/09/2023   INFLUENZA VACCINE  05/08/2024   Pneumococcal Vaccine: 50+ Years  Completed   Hepatitis C Screening  Completed   Zoster Vaccines- Shingrix  Completed   HPV VACCINES  Aged Out   Meningococcal B Vaccine  Aged Out   Colonoscopy  Discontinued     Discussed health benefits of physical activity, and encouraged him to engage in regular exercise appropriate for his age and condition.           Nancyann Perry, MD  [facility] [phone] (phone) [phone] (fax)  [facility]

## 2024-06-03 NOTE — Progress Notes (Signed)
 Complete physical exam   Patient: [name]   DOB: 1946/07/29   78 y.o. Male  MRN: [id number] Visit Date: 06/03/2024  Today's healthcare provider: Nancyann Perry, MD   Chief Complaint  Patient presents with   Medicare Wellness    Diet- General Exercise- 2 times a week, [facility] [employer] [facility] Overall Feeling- Wonderful Sleep- Once falls asleep, he sleeps like a baby. Concerns- Not as he knows of   Subjective    Discussed the use of AI scribe software for clinical note transcription with the patient, who gave verbal consent to proceed.  History of Present Illness   [name] is a 78 year old male with hypertension and atrial fibrillation who presents for an annual physical exam.  He monitors his blood pressure at home, with readings typically around 165/70 mmHg, occasionally in the 150s.  He has ongoing atrial fibrillation and experiences shortness of breath only when climbing stairs, which has not worsened recently. He continues to see a cardiologist once a year.  He is on levothyroxine  for thyroid  management and has not reported any new symptoms related to hypothyroidism.  He has resumed working out at [employer] a couple of times a week, which has improved his well-being. He had previously stopped exercising but decided to resume due to his lifelong habit of working out.  He experiences morning phlegm production, which resolves once he gets up and moves around. This does not persist throughout the day.  No chest pain, heart flutters, or shortness of breath except when climbing stairs. No hearing problems or ringing in the ears.         Past Medical History:  Diagnosis Date   Basal cell carcinoma of right lower extremity 03/24/2015   BPH with obstruction/lower urinary tract symptoms    Chronic atrial fibrillation (HCC)    Chronic heart failure with preserved ejection fraction (HFpEF) (HCC)    Dislocation of shoulder, anterior, right, closed 07/24/2014    Echocardiogram abnormal    2009 moderate to severely dilated left atrium   Erectile dysfunction    Essential hypertension    Gout    History of chicken pox    History of measles    Hyperglycemia    Hypogonadism in male    Nocturia    Peyronie's disease    Traumatic tear of right rotator cuff 07/24/2014   Venous stasis    chronic   Past Surgical History:  Procedure Laterality Date   CARDIAC CATHETERIZATION  03/28/2004   normal coronaries, reduced EF at 25-30% (Dr. FABIENE Hasten)   CARDIOVERSION, TEE guided  03/29/2004   Dr. FABIENE Hasten    COLONOSCOPY  05/28/2013   Dr. Jeri   HERNIA REPAIR  age 28   KNEE SURGERY     LUMBAR FUSION  age 40   MOHS SURGERY Right 08/20/2019   ALA done by Adine Gold, MD [facility]   MYOVIEW CARDIOVASCULAR STRESS TEST  07/2003   anterior wall thinning, LV systolic function depressed at 33%   PILONIDAL CYST EXCISION     Right Eyebrow Tumor Removed  age 28    SHOULDER ARTHROSCOPY WITH ROTATOR CUFF REPAIR AND SUBACROMIAL DECOMPRESSION Right 08/19/2014   Procedure: SHOULDER ARTHROSCOPY WITH ROTATOR CUFF REPAIR AND SUBACROMIAL DECOMPRESSION;  Surgeon: Lamar DELENA Millman, MD;  Location: [facility];  Service: Orthopedics;  Laterality: Right;   TOTAL HIP ARTHROPLASTY Right 03/25/2006   Dr. JULIANNA. Aluisio   TRANSTHORACIC ECHOCARDIOGRAM  09/11/2022  LV EF 50 to 55%.  Normal function with no RWMA.  Unable to assess DD - Afib.  Mildly reduced RV function moderately enlarged RV and moderately elevated PAP.  Severe biatrial enlargement.  Mild MR.  Normal RAP.:   Social History   Socioeconomic History   Marital status: Divorced    Spouse name: Not on file   Number of children: 1   Years of education: Not on file   Highest education level: Master's degree (e.g., MA, MS, MEng, MEd, MSW, MBA)  Occupational History   Occupation: retired Runner, broadcasting/film/video, taught PE and coached  Tobacco Use   Smoking status: Never   Smokeless tobacco: Never  Vaping Use   Vaping  status: Never Used  Substance and Sexual Activity   Alcohol use: Yes    Alcohol/week: 4.0 standard drinks of alcohol    Types: 4 Cans of beer per week   Drug use: No   Sexual activity: Not on file  Other Topics Concern   Not on file  Social History Narrative   Divorced father of 1. Retired Engineer, site.   ~2 beers / day. Does not smoke.   Works out 6 days a weeks - cardio & weights. He used to run and play basketball prior to his hip surgery in 2007.   Currently dating.   Social Drivers of Corporate investment banker Strain: Low Risk  (11/22/2021)   Overall Financial Resource Strain (CARDIA)    Difficulty of Paying Living Expenses: Not hard at all  Food Insecurity: No Food Insecurity (11/22/2021)   Hunger Vital Sign    Worried About Running Out of Food in the Last Year: Never true    Ran Out of Food in the Last Year: Never true  Transportation Needs: No Transportation Needs (11/22/2021)   PRAPARE - Administrator, Civil Service (Medical): No    Lack of Transportation (Non-Medical): No  Physical Activity: Insufficiently Active (11/22/2021)   Exercise Vital Sign    Days of Exercise per Week: 3 days    Minutes of Exercise per Session: 30 min  Stress: No Stress Concern Present (11/22/2021)   [employer] of Occupational Health - Occupational Stress Questionnaire    Feeling of Stress : Not at all  Social Connections: Moderately Isolated (11/22/2021)   Social Connection and Isolation Panel    Frequency of Communication with Friends and Family: More than three times a week    Frequency of Social Gatherings with Friends and Family: More than three times a week    Attends Religious Services: Never    Database administrator or Organizations: No    Attends Banker Meetings: Never    Marital Status: Living with partner  Intimate Partner Violence: Not At Risk (11/22/2021)   Humiliation, Afraid, Rape, and Kick questionnaire    Fear of Current or Ex-Partner: No     Emotionally Abused: No    Physically Abused: No    Sexually Abused: No   Family Status  Relation Name Status   Father  Deceased at age 13   Mother  Alive       has back problems, borderline Diabetic   MGF  Deceased at age 7       stroke at 76   PGF  Deceased at age 2       diabetes   Brother  Alive   MGM  Deceased at age 57   PGM  Deceased at age 34   Sister  Alive   [employer]  Deceased   Brother  Alive   Sister  Alive   Child  (Not Specified)   Neg Hx  (Not Specified)  No partnership data on file   Family History  Problem Relation Age of Onset   Hypertension Father    Prostate cancer Father    Bladder Cancer Father    Hypertension Mother    Stroke Maternal Grandfather    Diabetes Paternal Grandfather    Supraventricular tachycardia Brother    Arthritis Sister        RA   Diabetes Maternal Uncle        TYPE 2   Supraventricular tachycardia Child    Kidney disease Neg Hx    Heart attack Neg Hx    Colon cancer Neg Hx    Allergies  Allergen Reactions   Benazepril      COUGH     Patient Care Team: Gasper Nancyann BRAVO, MD as PCP - General (Family Medicine) Anner Alm ORN, MD as PCP - Cardiology (Cardiology) Darron Deatrice LABOR, MD as PCP - [facility] (Cardiology) Dasher, Alm LABOR, MD (Dermatology) Helon Clotilda LABOR, PA-C as Physician Assistant (Urology) System, Provider Not In   Medications: Outpatient Medications Prior to Visit  Medication Sig   carvedilol  (COREG ) 6.25 MG tablet TAKE ONE TABLET (6.25 MG TOTAL) BY MOUTH TWO (TWO) TIMES DAILY.   chlorthalidone  (HYGROTON ) 25 MG tablet TAKE ONE TABLET BY MOUTH ONCE A DAY   furosemide  (LASIX ) 20 MG tablet TAKE ONE TABLET BY MOUTH ONCE A DAY   levothyroxine  (SYNTHROID ) 50 MCG tablet TAKE ONE TABLET BY MOUTH DAILY (PER MD PATIENT NEEDS OFFICE VISIT)   Multiple Vitamin (MULTIVITAMIN) tablet Take 1 tablet by mouth daily.   potassium chloride  SA (KLOR-CON  M) 20 MEQ tablet TAKE TWO TABLETS BY MOUTH ONCE A DAY    rosuvastatin  (CRESTOR ) 10 MG tablet TAKE ONE TABLET BY MOUTH ONCE DAILY   valsartan  (DIOVAN ) 320 MG tablet TAKE ONE TABLET (320 MG TOTAL) BY MOUTH DAILY.   XARELTO  20 MG TABS tablet TAKE ONE TABLET (20 MG TOTAL) BY MOUTH DAILY WITH SUPPER.   No facility-administered medications prior to visit.    Review of Systems  Constitutional:  Negative for appetite change, chills and fever.  Respiratory:  Negative for chest tightness, shortness of breath and wheezing.   Cardiovascular:  Negative for chest pain and palpitations.  Gastrointestinal:  Negative for abdominal pain, nausea and vomiting.      Objective    BP 97/84 (BP Location: Left Arm, Patient Position: Sitting, Cuff Size: Normal)   Pulse 65   Temp 98.3 F (36.8 C) (Oral)   Ht 6' 3 (1.905 m)   Wt 234 lb 8 oz (106.4 kg)   SpO2 99%   BMI 29.31 kg/m    Physical Exam  General Appearance:    Well developed, well nourished male. Alert, cooperative, in no acute distress, appears stated age  Head:    Normocephalic, without obvious abnormality, atraumatic  Eyes:    PERRL, conjunctiva/corneas clear, EOM's intact, fundi    benign, both eyes       Ears:    Normal TM's and external ear canals, both ears  Nose:   Nares normal, septum midline, mucosa normal, no drainage   or sinus tenderness  Throat:   Lips, mucosa, and tongue normal; teeth and gums normal  Neck:   Supple, symmetrical, trachea midline, no adenopathy;       thyroid :  No enlargement/tenderness/nodules; no carotid  bruit or JVD  Back:     Symmetric, no curvature, ROM normal, no CVA tenderness  Lungs:     Clear to auscultation bilaterally, respirations unlabored  Chest wall:    No tenderness or deformity  Heart:    Normal heart rate. Irregularly irregular rhythm. No murmurs, rubs, or gallops.  S1 and S2 normal  Abdomen:     Soft, non-tender, bowel sounds active all four quadrants,    no masses, no organomegaly  Genitalia:    deferred  Rectal:    deferred  Extremities:    All extremities are intact. No cyanosis or edema  Pulses:   2+ and symmetric all extremities  Skin:   Skin color, texture, turgor normal, no rashes or lesions  Lymph nodes:   Cervical, supraclavicular, and axillary nodes normal  Neurologic:   CNII-XII intact. Normal strength, sensation and reflexes      throughout       Assessment & Plan    Routine Health Maintenance and Physical Exam  Exercise Activities and Dietary recommendations  Goals      DIET - EAT MORE FRUITS AND VEGETABLES     DIET - INCREASE WATER INTAKE     Recommend increasing water intake to 6-8 8 oz glasses a day.         Immunization History  Administered Date(s) Administered   Fluad Quad(high Dose 65+) 06/22/2019   INFLUENZA, HIGH DOSE SEASONAL PF 06/08/2020, 07/07/2021   Influenza-Unspecified 06/25/2017, 06/22/2018   PFIZER(Purple Top)SARS-COV-2 Vaccination 11/23/2019, 12/14/2019, 07/19/2020   Pneumococcal Conjugate-13 09/20/2014   Pneumococcal Polysaccharide-23 09/26/2015   Tdap 03/27/2011   Zoster Recombinant(Shingrix) 10/14/2018, 12/22/2018   Zoster, Live 03/24/2010    Health Maintenance  Topic Date Due   DTaP/Tdap/Td (2 - Td or Tdap) 03/26/2021   Medicare Annual Wellness (AWV)  11/22/2022   COVID-19 Vaccine (4 - 2024-25 season) 06/09/2023   INFLUENZA VACCINE  05/08/2024   Pneumococcal Vaccine: 50+ Years  Completed   Hepatitis C Screening  Completed   Zoster Vaccines- Shingrix  Completed   HPV VACCINES  Aged Out   Meningococcal B Vaccine  Aged Out   Colonoscopy  Discontinued    Discussed health benefits of physical activity, and encouraged him to engage in regular exercise appropriate for his age and condition.        2. Prostate cancer screening  - PSA Total (Reflex To Free)  3. Permanent atrial fibrillation (HCC) CHA2DS2-VASc Score 3; On Xarelto  Rate controlled, on DOAC. Continue routing follow up cardiology.   4. Prediabetes  - Hemoglobin A1c  5. 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 Continue DOAC  6. Essential hypertension BP fairly well controlled. Continue current medications.    7. Peripheral vascular disease (HCC) Asymptomatic. Compliant with medication.  Continue aggressive risk factor modification.    8. Fatty liver Checking transaminases today.   9. Hyperlipidemia with target LDL less than 100 He is tolerating rosuvastatin  well with no adverse effects.   - CBC - Comprehensive metabolic panel with GFR - Lipid panel  10. Hypothyroidism, unspecified type  - T4 AND TSH         Nancyann Perry, MD  [facility] [phone] (phone) [phone] (fax)  [facility]

## 2024-06-04 ENCOUNTER — Ambulatory Visit: Payer: Self-pay | Admitting: Family Medicine

## 2024-06-04 LAB — CBC
Hematocrit: 38.4 % (ref 37.5–51.0)
Hemoglobin: 12.7 g/dL — ABNORMAL LOW (ref 13.0–17.7)
MCH: 32.3 pg (ref 26.6–33.0)
MCHC: 33.1 g/dL (ref 31.5–35.7)
MCV: 98 fL — ABNORMAL HIGH (ref 79–97)
Platelets: 260 x10E3/uL (ref 150–450)
RBC: 3.93 x10E6/uL — ABNORMAL LOW (ref 4.14–5.80)
RDW: 13.2 % (ref 11.6–15.4)
WBC: 6.3 x10E3/uL (ref 3.4–10.8)

## 2024-06-04 LAB — T4 AND TSH
T4, Total: 6.5 ug/dL (ref 4.5–12.0)
TSH: 3.29 u[IU]/mL (ref 0.450–4.500)

## 2024-06-04 LAB — COMPREHENSIVE METABOLIC PANEL WITH GFR
ALT: 27 IU/L (ref 0–44)
AST: 31 IU/L (ref 0–40)
Albumin: 4.6 g/dL (ref 3.8–4.8)
Alkaline Phosphatase: 138 IU/L — ABNORMAL HIGH (ref 44–121)
BUN/Creatinine Ratio: 13 (ref 10–24)
BUN: 13 mg/dL (ref 8–27)
Bilirubin Total: 1 mg/dL (ref 0.0–1.2)
CO2: 27 mmol/L (ref 20–29)
Calcium: 10.2 mg/dL (ref 8.6–10.2)
Chloride: 98 mmol/L (ref 96–106)
Creatinine, Ser: 1.02 mg/dL (ref 0.76–1.27)
Globulin, Total: 2.6 g/dL (ref 1.5–4.5)
Glucose: 115 mg/dL — ABNORMAL HIGH (ref 70–99)
Potassium: 4.6 mmol/L (ref 3.5–5.2)
Sodium: 139 mmol/L (ref 134–144)
Total Protein: 7.2 g/dL (ref 6.0–8.5)
eGFR: 76 mL/min/1.73 (ref >59)

## 2024-06-04 LAB — LIPID PANEL
Chol/HDL Ratio: 1.8 ratio (ref 0.0–5.0)
Cholesterol, Total: 122 mg/dL (ref 100–199)
HDL: 66 mg/dL (ref >39)
LDL Chol Calc (NIH): 45 mg/dL (ref 0–99)
Triglycerides: 43 mg/dL (ref 0–149)
VLDL Cholesterol Cal: 11 mg/dL (ref 5–40)

## 2024-06-04 LAB — HEMOGLOBIN A1C
Est. average glucose Bld gHb Est-mCnc: 117 mg/dL
Hgb A1c MFr Bld: 5.7 % — ABNORMAL HIGH (ref 4.8–5.6)

## 2024-06-04 LAB — PSA TOTAL (REFLEX TO FREE): Prostate Specific Ag, Serum: 0.5 ng/mL (ref 0.0–4.0)

## 2024-06-05 ENCOUNTER — Other Ambulatory Visit: Payer: Self-pay | Admitting: Family Medicine

## 2024-06-05 DIAGNOSIS — E039 Hypothyroidism, unspecified: Secondary | ICD-10-CM

## 2024-06-11 ENCOUNTER — Encounter: Attending: Physician Assistant | Admitting: Physician Assistant

## 2024-06-11 DIAGNOSIS — L97822 Non-pressure chronic ulcer of other part of left lower leg with fat layer exposed: Secondary | ICD-10-CM | POA: Insufficient documentation

## 2024-06-11 DIAGNOSIS — I7389 Other specified peripheral vascular diseases: Secondary | ICD-10-CM | POA: Insufficient documentation

## 2024-06-11 DIAGNOSIS — I87333 Chronic venous hypertension (idiopathic) with ulcer and inflammation of bilateral lower extremity: Secondary | ICD-10-CM | POA: Insufficient documentation

## 2024-06-11 DIAGNOSIS — I890 Lymphedema, not elsewhere classified: Secondary | ICD-10-CM | POA: Diagnosis not present

## 2024-06-11 DIAGNOSIS — I480 Paroxysmal atrial fibrillation: Secondary | ICD-10-CM | POA: Diagnosis not present

## 2024-06-11 DIAGNOSIS — L959 Vasculitis limited to the skin, unspecified: Secondary | ICD-10-CM | POA: Diagnosis not present

## 2024-06-11 DIAGNOSIS — I10 Essential (primary) hypertension: Secondary | ICD-10-CM | POA: Insufficient documentation

## 2024-06-17 ENCOUNTER — Encounter: Admitting: Physician Assistant

## 2024-06-17 DIAGNOSIS — I7389 Other specified peripheral vascular diseases: Secondary | ICD-10-CM | POA: Diagnosis not present

## 2024-06-17 DIAGNOSIS — I480 Paroxysmal atrial fibrillation: Secondary | ICD-10-CM | POA: Diagnosis not present

## 2024-06-17 DIAGNOSIS — I10 Essential (primary) hypertension: Secondary | ICD-10-CM | POA: Diagnosis not present

## 2024-06-17 DIAGNOSIS — L97822 Non-pressure chronic ulcer of other part of left lower leg with fat layer exposed: Secondary | ICD-10-CM | POA: Diagnosis not present

## 2024-06-17 DIAGNOSIS — I87333 Chronic venous hypertension (idiopathic) with ulcer and inflammation of bilateral lower extremity: Secondary | ICD-10-CM | POA: Diagnosis not present

## 2024-06-17 DIAGNOSIS — I890 Lymphedema, not elsewhere classified: Secondary | ICD-10-CM | POA: Diagnosis not present

## 2024-06-17 DIAGNOSIS — L959 Vasculitis limited to the skin, unspecified: Secondary | ICD-10-CM | POA: Diagnosis not present

## 2024-06-25 ENCOUNTER — Encounter: Admitting: Physician Assistant

## 2024-06-25 DIAGNOSIS — L959 Vasculitis limited to the skin, unspecified: Secondary | ICD-10-CM | POA: Diagnosis not present

## 2024-06-25 DIAGNOSIS — L97822 Non-pressure chronic ulcer of other part of left lower leg with fat layer exposed: Secondary | ICD-10-CM | POA: Diagnosis not present

## 2024-06-25 DIAGNOSIS — I7389 Other specified peripheral vascular diseases: Secondary | ICD-10-CM | POA: Diagnosis not present

## 2024-06-25 DIAGNOSIS — I890 Lymphedema, not elsewhere classified: Secondary | ICD-10-CM | POA: Diagnosis not present

## 2024-06-25 DIAGNOSIS — I87333 Chronic venous hypertension (idiopathic) with ulcer and inflammation of bilateral lower extremity: Secondary | ICD-10-CM | POA: Diagnosis not present

## 2024-06-25 DIAGNOSIS — I480 Paroxysmal atrial fibrillation: Secondary | ICD-10-CM | POA: Diagnosis not present

## 2024-06-25 DIAGNOSIS — I10 Essential (primary) hypertension: Secondary | ICD-10-CM | POA: Diagnosis not present

## 2024-07-02 ENCOUNTER — Encounter: Admitting: Physician Assistant

## 2024-07-02 DIAGNOSIS — I87333 Chronic venous hypertension (idiopathic) with ulcer and inflammation of bilateral lower extremity: Secondary | ICD-10-CM | POA: Diagnosis not present

## 2024-07-02 DIAGNOSIS — I7389 Other specified peripheral vascular diseases: Secondary | ICD-10-CM | POA: Diagnosis not present

## 2024-07-02 DIAGNOSIS — L97822 Non-pressure chronic ulcer of other part of left lower leg with fat layer exposed: Secondary | ICD-10-CM | POA: Diagnosis not present

## 2024-07-02 DIAGNOSIS — I480 Paroxysmal atrial fibrillation: Secondary | ICD-10-CM | POA: Diagnosis not present

## 2024-07-02 DIAGNOSIS — I10 Essential (primary) hypertension: Secondary | ICD-10-CM | POA: Diagnosis not present

## 2024-07-02 DIAGNOSIS — L959 Vasculitis limited to the skin, unspecified: Secondary | ICD-10-CM | POA: Diagnosis not present

## 2024-07-02 DIAGNOSIS — L97812 Non-pressure chronic ulcer of other part of right lower leg with fat layer exposed: Secondary | ICD-10-CM | POA: Diagnosis not present

## 2024-07-02 DIAGNOSIS — I890 Lymphedema, not elsewhere classified: Secondary | ICD-10-CM | POA: Diagnosis not present

## 2024-07-06 DIAGNOSIS — L97822 Non-pressure chronic ulcer of other part of left lower leg with fat layer exposed: Secondary | ICD-10-CM | POA: Diagnosis not present

## 2024-07-06 DIAGNOSIS — I87333 Chronic venous hypertension (idiopathic) with ulcer and inflammation of bilateral lower extremity: Secondary | ICD-10-CM | POA: Diagnosis not present

## 2024-07-06 DIAGNOSIS — L97812 Non-pressure chronic ulcer of other part of right lower leg with fat layer exposed: Secondary | ICD-10-CM | POA: Diagnosis not present

## 2024-07-06 DIAGNOSIS — I480 Paroxysmal atrial fibrillation: Secondary | ICD-10-CM | POA: Diagnosis not present

## 2024-07-06 DIAGNOSIS — I10 Essential (primary) hypertension: Secondary | ICD-10-CM | POA: Diagnosis not present

## 2024-07-06 DIAGNOSIS — I7389 Other specified peripheral vascular diseases: Secondary | ICD-10-CM | POA: Diagnosis not present

## 2024-07-08 ENCOUNTER — Encounter: Attending: Physician Assistant | Admitting: Physician Assistant

## 2024-07-08 DIAGNOSIS — I890 Lymphedema, not elsewhere classified: Secondary | ICD-10-CM | POA: Insufficient documentation

## 2024-07-08 DIAGNOSIS — I87333 Chronic venous hypertension (idiopathic) with ulcer and inflammation of bilateral lower extremity: Secondary | ICD-10-CM | POA: Insufficient documentation

## 2024-07-08 DIAGNOSIS — L97822 Non-pressure chronic ulcer of other part of left lower leg with fat layer exposed: Secondary | ICD-10-CM | POA: Diagnosis not present

## 2024-07-08 DIAGNOSIS — L959 Vasculitis limited to the skin, unspecified: Secondary | ICD-10-CM | POA: Diagnosis not present

## 2024-07-08 DIAGNOSIS — I480 Paroxysmal atrial fibrillation: Secondary | ICD-10-CM | POA: Insufficient documentation

## 2024-07-08 DIAGNOSIS — I7389 Other specified peripheral vascular diseases: Secondary | ICD-10-CM | POA: Insufficient documentation

## 2024-07-08 DIAGNOSIS — I10 Essential (primary) hypertension: Secondary | ICD-10-CM | POA: Insufficient documentation

## 2024-07-15 ENCOUNTER — Encounter: Admitting: Physician Assistant

## 2024-07-15 DIAGNOSIS — L97822 Non-pressure chronic ulcer of other part of left lower leg with fat layer exposed: Secondary | ICD-10-CM | POA: Diagnosis not present

## 2024-07-15 DIAGNOSIS — L959 Vasculitis limited to the skin, unspecified: Secondary | ICD-10-CM | POA: Diagnosis not present

## 2024-07-15 DIAGNOSIS — I890 Lymphedema, not elsewhere classified: Secondary | ICD-10-CM | POA: Diagnosis not present

## 2024-07-21 ENCOUNTER — Encounter

## 2024-07-21 DIAGNOSIS — I7389 Other specified peripheral vascular diseases: Secondary | ICD-10-CM | POA: Diagnosis not present

## 2024-07-21 DIAGNOSIS — I480 Paroxysmal atrial fibrillation: Secondary | ICD-10-CM | POA: Diagnosis not present

## 2024-07-21 DIAGNOSIS — L97822 Non-pressure chronic ulcer of other part of left lower leg with fat layer exposed: Secondary | ICD-10-CM | POA: Diagnosis not present

## 2024-07-21 DIAGNOSIS — I890 Lymphedema, not elsewhere classified: Secondary | ICD-10-CM | POA: Diagnosis not present

## 2024-07-21 DIAGNOSIS — I10 Essential (primary) hypertension: Secondary | ICD-10-CM | POA: Diagnosis not present

## 2024-07-21 DIAGNOSIS — I87333 Chronic venous hypertension (idiopathic) with ulcer and inflammation of bilateral lower extremity: Secondary | ICD-10-CM | POA: Diagnosis not present

## 2024-07-28 ENCOUNTER — Encounter: Admitting: Physician Assistant

## 2024-07-28 DIAGNOSIS — I10 Essential (primary) hypertension: Secondary | ICD-10-CM | POA: Diagnosis not present

## 2024-07-28 DIAGNOSIS — I7389 Other specified peripheral vascular diseases: Secondary | ICD-10-CM | POA: Diagnosis not present

## 2024-07-28 DIAGNOSIS — L959 Vasculitis limited to the skin, unspecified: Secondary | ICD-10-CM | POA: Diagnosis not present

## 2024-07-28 DIAGNOSIS — L97822 Non-pressure chronic ulcer of other part of left lower leg with fat layer exposed: Secondary | ICD-10-CM | POA: Diagnosis not present

## 2024-07-28 DIAGNOSIS — I890 Lymphedema, not elsewhere classified: Secondary | ICD-10-CM | POA: Diagnosis not present

## 2024-07-28 DIAGNOSIS — I480 Paroxysmal atrial fibrillation: Secondary | ICD-10-CM | POA: Diagnosis not present

## 2024-07-28 DIAGNOSIS — I87333 Chronic venous hypertension (idiopathic) with ulcer and inflammation of bilateral lower extremity: Secondary | ICD-10-CM | POA: Diagnosis not present

## 2024-08-04 ENCOUNTER — Other Ambulatory Visit: Payer: Self-pay | Admitting: Cardiology

## 2024-08-04 ENCOUNTER — Encounter: Admitting: Physician Assistant

## 2024-08-04 DIAGNOSIS — I890 Lymphedema, not elsewhere classified: Secondary | ICD-10-CM | POA: Diagnosis not present

## 2024-08-04 DIAGNOSIS — I10 Essential (primary) hypertension: Secondary | ICD-10-CM | POA: Diagnosis not present

## 2024-08-04 DIAGNOSIS — L959 Vasculitis limited to the skin, unspecified: Secondary | ICD-10-CM | POA: Diagnosis not present

## 2024-08-04 DIAGNOSIS — I872 Venous insufficiency (chronic) (peripheral): Secondary | ICD-10-CM

## 2024-08-04 DIAGNOSIS — I480 Paroxysmal atrial fibrillation: Secondary | ICD-10-CM | POA: Diagnosis not present

## 2024-08-04 DIAGNOSIS — I87333 Chronic venous hypertension (idiopathic) with ulcer and inflammation of bilateral lower extremity: Secondary | ICD-10-CM | POA: Diagnosis not present

## 2024-08-04 DIAGNOSIS — L97822 Non-pressure chronic ulcer of other part of left lower leg with fat layer exposed: Secondary | ICD-10-CM | POA: Diagnosis not present

## 2024-08-04 DIAGNOSIS — I7389 Other specified peripheral vascular diseases: Secondary | ICD-10-CM | POA: Diagnosis not present

## 2024-08-10 ENCOUNTER — Ambulatory Visit: Admitting: Family Medicine

## 2024-08-11 DIAGNOSIS — D2261 Melanocytic nevi of right upper limb, including shoulder: Secondary | ICD-10-CM | POA: Diagnosis not present

## 2024-08-11 DIAGNOSIS — D485 Neoplasm of uncertain behavior of skin: Secondary | ICD-10-CM | POA: Diagnosis not present

## 2024-08-11 DIAGNOSIS — D2272 Melanocytic nevi of left lower limb, including hip: Secondary | ICD-10-CM | POA: Diagnosis not present

## 2024-08-11 DIAGNOSIS — Z85828 Personal history of other malignant neoplasm of skin: Secondary | ICD-10-CM | POA: Diagnosis not present

## 2024-08-11 DIAGNOSIS — D2271 Melanocytic nevi of right lower limb, including hip: Secondary | ICD-10-CM | POA: Diagnosis not present

## 2024-08-11 DIAGNOSIS — L820 Inflamed seborrheic keratosis: Secondary | ICD-10-CM | POA: Diagnosis not present

## 2024-08-11 DIAGNOSIS — R208 Other disturbances of skin sensation: Secondary | ICD-10-CM | POA: Diagnosis not present

## 2024-08-11 DIAGNOSIS — L538 Other specified erythematous conditions: Secondary | ICD-10-CM | POA: Diagnosis not present

## 2024-08-11 DIAGNOSIS — D2262 Melanocytic nevi of left upper limb, including shoulder: Secondary | ICD-10-CM | POA: Diagnosis not present

## 2024-08-11 DIAGNOSIS — C44722 Squamous cell carcinoma of skin of right lower limb, including hip: Secondary | ICD-10-CM | POA: Diagnosis not present

## 2024-08-12 ENCOUNTER — Encounter: Attending: Physician Assistant | Admitting: Physician Assistant

## 2024-08-12 DIAGNOSIS — I480 Paroxysmal atrial fibrillation: Secondary | ICD-10-CM | POA: Diagnosis not present

## 2024-08-12 DIAGNOSIS — I10 Essential (primary) hypertension: Secondary | ICD-10-CM | POA: Diagnosis not present

## 2024-08-12 DIAGNOSIS — I7389 Other specified peripheral vascular diseases: Secondary | ICD-10-CM | POA: Diagnosis not present

## 2024-08-12 DIAGNOSIS — L97822 Non-pressure chronic ulcer of other part of left lower leg with fat layer exposed: Secondary | ICD-10-CM | POA: Diagnosis not present

## 2024-08-12 DIAGNOSIS — I890 Lymphedema, not elsewhere classified: Secondary | ICD-10-CM | POA: Diagnosis not present

## 2024-08-12 DIAGNOSIS — I87333 Chronic venous hypertension (idiopathic) with ulcer and inflammation of bilateral lower extremity: Secondary | ICD-10-CM | POA: Diagnosis not present

## 2024-08-12 DIAGNOSIS — L959 Vasculitis limited to the skin, unspecified: Secondary | ICD-10-CM | POA: Diagnosis not present

## 2024-08-20 ENCOUNTER — Encounter: Admitting: Physician Assistant

## 2024-08-20 DIAGNOSIS — I87333 Chronic venous hypertension (idiopathic) with ulcer and inflammation of bilateral lower extremity: Secondary | ICD-10-CM | POA: Diagnosis not present

## 2024-08-20 DIAGNOSIS — I10 Essential (primary) hypertension: Secondary | ICD-10-CM | POA: Diagnosis not present

## 2024-08-20 DIAGNOSIS — I480 Paroxysmal atrial fibrillation: Secondary | ICD-10-CM | POA: Diagnosis not present

## 2024-08-20 DIAGNOSIS — L959 Vasculitis limited to the skin, unspecified: Secondary | ICD-10-CM | POA: Diagnosis not present

## 2024-08-20 DIAGNOSIS — I890 Lymphedema, not elsewhere classified: Secondary | ICD-10-CM | POA: Diagnosis not present

## 2024-08-20 DIAGNOSIS — L97822 Non-pressure chronic ulcer of other part of left lower leg with fat layer exposed: Secondary | ICD-10-CM | POA: Diagnosis not present

## 2024-08-20 DIAGNOSIS — I7389 Other specified peripheral vascular diseases: Secondary | ICD-10-CM | POA: Diagnosis not present

## 2024-08-28 DIAGNOSIS — H2513 Age-related nuclear cataract, bilateral: Secondary | ICD-10-CM | POA: Diagnosis not present

## 2024-08-28 DIAGNOSIS — H43811 Vitreous degeneration, right eye: Secondary | ICD-10-CM | POA: Diagnosis not present

## 2024-09-25 ENCOUNTER — Encounter: Attending: Internal Medicine | Admitting: Internal Medicine

## 2024-09-25 DIAGNOSIS — I87333 Chronic venous hypertension (idiopathic) with ulcer and inflammation of bilateral lower extremity: Secondary | ICD-10-CM | POA: Insufficient documentation

## 2024-09-25 DIAGNOSIS — I7389 Other specified peripheral vascular diseases: Secondary | ICD-10-CM | POA: Diagnosis not present

## 2024-09-25 DIAGNOSIS — I480 Paroxysmal atrial fibrillation: Secondary | ICD-10-CM | POA: Insufficient documentation

## 2024-09-25 DIAGNOSIS — L97822 Non-pressure chronic ulcer of other part of left lower leg with fat layer exposed: Secondary | ICD-10-CM | POA: Insufficient documentation

## 2024-09-25 DIAGNOSIS — I890 Lymphedema, not elsewhere classified: Secondary | ICD-10-CM | POA: Diagnosis present

## 2024-09-25 DIAGNOSIS — I10 Essential (primary) hypertension: Secondary | ICD-10-CM | POA: Diagnosis not present

## 2024-09-25 DIAGNOSIS — Z7901 Long term (current) use of anticoagulants: Secondary | ICD-10-CM | POA: Diagnosis not present

## 2024-09-29 ENCOUNTER — Encounter

## 2024-09-29 DIAGNOSIS — I890 Lymphedema, not elsewhere classified: Secondary | ICD-10-CM | POA: Diagnosis not present

## 2024-10-06 ENCOUNTER — Encounter

## 2024-10-06 DIAGNOSIS — I890 Lymphedema, not elsewhere classified: Secondary | ICD-10-CM | POA: Diagnosis not present

## 2024-10-14 ENCOUNTER — Encounter: Attending: Physician Assistant | Admitting: Physician Assistant

## 2024-10-14 DIAGNOSIS — L97822 Non-pressure chronic ulcer of other part of left lower leg with fat layer exposed: Secondary | ICD-10-CM | POA: Diagnosis not present

## 2024-10-14 DIAGNOSIS — I10 Essential (primary) hypertension: Secondary | ICD-10-CM | POA: Diagnosis not present

## 2024-10-14 DIAGNOSIS — I87333 Chronic venous hypertension (idiopathic) with ulcer and inflammation of bilateral lower extremity: Secondary | ICD-10-CM | POA: Insufficient documentation

## 2024-10-14 DIAGNOSIS — I7389 Other specified peripheral vascular diseases: Secondary | ICD-10-CM | POA: Diagnosis not present

## 2024-10-14 DIAGNOSIS — I480 Paroxysmal atrial fibrillation: Secondary | ICD-10-CM | POA: Insufficient documentation

## 2024-10-21 ENCOUNTER — Encounter: Admitting: Physician Assistant

## 2024-10-21 DIAGNOSIS — I87333 Chronic venous hypertension (idiopathic) with ulcer and inflammation of bilateral lower extremity: Secondary | ICD-10-CM | POA: Diagnosis not present

## 2024-10-28 ENCOUNTER — Encounter: Admitting: Physician Assistant

## 2024-10-28 DIAGNOSIS — I87333 Chronic venous hypertension (idiopathic) with ulcer and inflammation of bilateral lower extremity: Secondary | ICD-10-CM | POA: Diagnosis not present

## 2024-10-30 ENCOUNTER — Other Ambulatory Visit: Payer: Self-pay | Admitting: Cardiology

## 2024-11-05 ENCOUNTER — Encounter: Admitting: Physician Assistant

## 2024-11-05 DIAGNOSIS — I87333 Chronic venous hypertension (idiopathic) with ulcer and inflammation of bilateral lower extremity: Secondary | ICD-10-CM | POA: Diagnosis not present

## 2024-11-06 NOTE — Telephone Encounter (Signed)
 In accordance with refill protocols, please review and address the following requirements before this medication refill can be authorized:  Appointment  Pt was called, message left for pt to called back to make overdue appt. Medication sent to pt's pharmacy with 1st attempt

## 2025-06-02 ENCOUNTER — Ambulatory Visit: Admitting: Family Medicine
